# Patient Record
Sex: Male | Born: 1960 | Race: White | Hispanic: No | Marital: Married | Smoking: Former smoker
Health system: Southern US, Community
[De-identification: ages and names within clinical notes are randomized; demographics above are authoritative.]

## PROBLEM LIST (undated history)

## (undated) DIAGNOSIS — T4145XA Adverse effect of unspecified anesthetic, initial encounter: Secondary | ICD-10-CM

## (undated) DIAGNOSIS — G8929 Other chronic pain: Secondary | ICD-10-CM

## (undated) DIAGNOSIS — K559 Vascular disorder of intestine, unspecified: Secondary | ICD-10-CM

## (undated) DIAGNOSIS — R42 Dizziness and giddiness: Secondary | ICD-10-CM

## (undated) DIAGNOSIS — E1142 Type 2 diabetes mellitus with diabetic polyneuropathy: Secondary | ICD-10-CM

## (undated) DIAGNOSIS — M7502 Adhesive capsulitis of left shoulder: Secondary | ICD-10-CM

## (undated) DIAGNOSIS — E785 Hyperlipidemia, unspecified: Secondary | ICD-10-CM

## (undated) DIAGNOSIS — I639 Cerebral infarction, unspecified: Secondary | ICD-10-CM

## (undated) DIAGNOSIS — I10 Essential (primary) hypertension: Secondary | ICD-10-CM

## (undated) DIAGNOSIS — F329 Major depressive disorder, single episode, unspecified: Secondary | ICD-10-CM

## (undated) DIAGNOSIS — M1990 Unspecified osteoarthritis, unspecified site: Secondary | ICD-10-CM

## (undated) DIAGNOSIS — F32A Depression, unspecified: Secondary | ICD-10-CM

## (undated) DIAGNOSIS — G4730 Sleep apnea, unspecified: Secondary | ICD-10-CM

## (undated) DIAGNOSIS — Z5189 Encounter for other specified aftercare: Secondary | ICD-10-CM

## (undated) DIAGNOSIS — I6930 Unspecified sequelae of cerebral infarction: Secondary | ICD-10-CM

## (undated) DIAGNOSIS — R61 Generalized hyperhidrosis: Secondary | ICD-10-CM

## (undated) DIAGNOSIS — I4891 Unspecified atrial fibrillation: Secondary | ICD-10-CM

## (undated) DIAGNOSIS — R51 Headache: Secondary | ICD-10-CM

## (undated) DIAGNOSIS — R519 Headache, unspecified: Secondary | ICD-10-CM

## (undated) DIAGNOSIS — M7542 Impingement syndrome of left shoulder: Secondary | ICD-10-CM

## (undated) DIAGNOSIS — K4090 Unilateral inguinal hernia, without obstruction or gangrene, not specified as recurrent: Secondary | ICD-10-CM

## (undated) DIAGNOSIS — T8859XA Other complications of anesthesia, initial encounter: Secondary | ICD-10-CM

## (undated) DIAGNOSIS — G5790 Unspecified mononeuropathy of unspecified lower limb: Secondary | ICD-10-CM

## (undated) DIAGNOSIS — K5790 Diverticulosis of intestine, part unspecified, without perforation or abscess without bleeding: Secondary | ICD-10-CM

## (undated) DIAGNOSIS — R55 Syncope and collapse: Secondary | ICD-10-CM

## (undated) DIAGNOSIS — F419 Anxiety disorder, unspecified: Secondary | ICD-10-CM

## (undated) HISTORY — PX: WRIST SURGERY: SHX841

## (undated) HISTORY — DX: Unspecified osteoarthritis, unspecified site: M19.90

## (undated) HISTORY — DX: Type 2 diabetes mellitus with diabetic polyneuropathy: E11.42

## (undated) HISTORY — DX: Dizziness and giddiness: R42

## (undated) HISTORY — DX: Hyperlipidemia, unspecified: E78.5

## (undated) HISTORY — DX: Diverticulosis of intestine, part unspecified, without perforation or abscess without bleeding: K57.90

## (undated) HISTORY — DX: Vascular disorder of intestine, unspecified: K55.9

## (undated) HISTORY — DX: Unilateral inguinal hernia, without obstruction or gangrene, not specified as recurrent: K40.90

## (undated) HISTORY — PX: KNEE ARTHROSCOPY: SUR90

## (undated) HISTORY — DX: Major depressive disorder, single episode, unspecified: F32.9

## (undated) HISTORY — DX: Generalized hyperhidrosis: R61

## (undated) HISTORY — PX: UMBILICAL HERNIA REPAIR: SHX196

## (undated) HISTORY — PX: INGUINAL HERNIA REPAIR: SUR1180

## (undated) HISTORY — DX: Cerebral infarction, unspecified: I63.9

## (undated) HISTORY — DX: Anxiety disorder, unspecified: F41.9

## (undated) HISTORY — PX: COLONOSCOPY: SHX174

## (undated) HISTORY — DX: Unspecified mononeuropathy of unspecified lower limb: G57.90

## (undated) HISTORY — DX: Sleep apnea, unspecified: G47.30

## (undated) HISTORY — PX: CERVICAL DISC SURGERY: SHX588

## (undated) HISTORY — DX: Encounter for other specified aftercare: Z51.89

## (undated) HISTORY — PX: SHOULDER ARTHROSCOPY: SHX128

## (undated) HISTORY — DX: Depression, unspecified: F32.A

## (undated) HISTORY — DX: Headache: R51

## (undated) HISTORY — DX: Headache, unspecified: R51.9

## (undated) HISTORY — DX: Other chronic pain: G89.29

## (undated) HISTORY — DX: Syncope and collapse: R55

## (undated) MED FILL — Folic Acid Tab 1 MG: ORAL | Fill #1 | Status: CN

---

## 2001-05-03 HISTORY — PX: COLON SURGERY: SHX602

## 2002-10-01 ENCOUNTER — Encounter: Payer: Self-pay | Admitting: Emergency Medicine

## 2002-10-01 ENCOUNTER — Inpatient Hospital Stay (HOSPITAL_COMMUNITY): Admission: EM | Admit: 2002-10-01 | Discharge: 2002-10-05 | Payer: Self-pay | Admitting: Emergency Medicine

## 2002-11-06 ENCOUNTER — Encounter: Payer: Self-pay | Admitting: Family Medicine

## 2002-11-06 ENCOUNTER — Inpatient Hospital Stay (HOSPITAL_COMMUNITY): Admission: AD | Admit: 2002-11-06 | Discharge: 2002-11-11 | Payer: Self-pay | Admitting: Family Medicine

## 2002-11-27 ENCOUNTER — Ambulatory Visit (HOSPITAL_COMMUNITY): Admission: RE | Admit: 2002-11-27 | Discharge: 2002-11-27 | Payer: Self-pay | Admitting: Internal Medicine

## 2002-12-04 ENCOUNTER — Inpatient Hospital Stay (HOSPITAL_COMMUNITY): Admission: RE | Admit: 2002-12-04 | Discharge: 2002-12-12 | Payer: Self-pay | Admitting: General Surgery

## 2002-12-28 ENCOUNTER — Ambulatory Visit (HOSPITAL_COMMUNITY): Admission: RE | Admit: 2002-12-28 | Discharge: 2002-12-28 | Payer: Self-pay | Admitting: General Surgery

## 2003-12-05 ENCOUNTER — Ambulatory Visit (HOSPITAL_COMMUNITY): Admission: RE | Admit: 2003-12-05 | Discharge: 2003-12-05 | Payer: Self-pay | Admitting: Orthopedic Surgery

## 2005-11-15 ENCOUNTER — Ambulatory Visit (HOSPITAL_COMMUNITY): Admission: RE | Admit: 2005-11-15 | Discharge: 2005-11-15 | Payer: Self-pay | Admitting: Unknown Physician Specialty

## 2005-12-27 ENCOUNTER — Encounter: Admission: RE | Admit: 2005-12-27 | Discharge: 2005-12-27 | Payer: Self-pay | Admitting: Neurosurgery

## 2005-12-29 ENCOUNTER — Encounter: Admission: RE | Admit: 2005-12-29 | Discharge: 2005-12-29 | Payer: Self-pay | Admitting: Neurosurgery

## 2006-01-26 ENCOUNTER — Observation Stay (HOSPITAL_COMMUNITY): Admission: RE | Admit: 2006-01-26 | Discharge: 2006-01-28 | Payer: Self-pay | Admitting: Neurosurgery

## 2010-10-02 ENCOUNTER — Other Ambulatory Visit: Payer: Self-pay | Admitting: Family Medicine

## 2010-10-02 DIAGNOSIS — M25511 Pain in right shoulder: Secondary | ICD-10-CM

## 2010-10-05 ENCOUNTER — Ambulatory Visit (HOSPITAL_COMMUNITY)
Admission: RE | Admit: 2010-10-05 | Discharge: 2010-10-05 | Disposition: A | Discharge status: Home / Self Care | Payer: 59 | Source: Ambulatory Visit | Attending: Family Medicine | Admitting: Family Medicine

## 2010-10-05 DIAGNOSIS — M25519 Pain in unspecified shoulder: Secondary | ICD-10-CM | POA: Insufficient documentation

## 2010-10-05 DIAGNOSIS — M19019 Primary osteoarthritis, unspecified shoulder: Secondary | ICD-10-CM | POA: Insufficient documentation

## 2010-10-05 DIAGNOSIS — M719 Bursopathy, unspecified: Secondary | ICD-10-CM | POA: Insufficient documentation

## 2010-10-05 DIAGNOSIS — M67919 Unspecified disorder of synovium and tendon, unspecified shoulder: Secondary | ICD-10-CM | POA: Insufficient documentation

## 2010-10-05 DIAGNOSIS — M25511 Pain in right shoulder: Secondary | ICD-10-CM

## 2010-11-11 ENCOUNTER — Encounter (INDEPENDENT_AMBULATORY_CARE_PROVIDER_SITE_OTHER): Payer: Self-pay | Admitting: Surgery

## 2010-11-11 ENCOUNTER — Ambulatory Visit (INDEPENDENT_AMBULATORY_CARE_PROVIDER_SITE_OTHER): Payer: 59 | Admitting: Surgery

## 2010-11-11 VITALS — BP 140/90 | HR 72 | Temp 96.7°F | Ht 73.0 in | Wt 192.8 lb

## 2010-11-11 DIAGNOSIS — K430 Incisional hernia with obstruction, without gangrene: Secondary | ICD-10-CM

## 2010-11-11 NOTE — Progress Notes (Signed)
Subjective:     Patient ID: Travis [name], male   DOB: 02-18-1961, 50 y.o.   MRN: [id number]    BP 140/90  Pulse 72  Temp 96.7 F (35.9 C)  Ht 6\' 1"  (1.854 m)  Wt 192 lb 12.8 oz (87.454 kg)  BMI 25.44 kg/m2    HPI This is a pleasant 50 year old gentleman referred by Dr.-Luking for evaluation of a incisional hernia. The patient has had the hernia for several years. This was after partial colectomy for diverticulitis. He is having increasing discomfort and some possible nausea but no other obstructive symptoms. He reports that the hernia always easily reduces. The pain is described as a mild ache. He has no other complaints. Past Medical History  Diagnosis Date  . Night sweats     every once in a while  . Hyperlipidemia   . Diabetes mellitus   . Hernia, inguinal   . Dizziness   . Chronic headaches   . Arthritis     Past Surgical History  Procedure Date  . Hernia repair     2  . Colon surgery     2/3 taken out  . Neck surgery   . Wrist surgery     fusion  . Shoulder arthroscopy     1  . Knee arthroscopy    No Known Allergies  Current outpatient prescriptions:aspirin 81 MG tablet, Take 81 mg by mouth daily.  , Disp: , Rfl: ;  Cyanocobalamin (VITAMIN B12 PO), Take by mouth daily.  , Disp: , Rfl: ;  Gemfibrozil (LOPID PO), Take by mouth 2 (two) times daily.  , Disp: , Rfl: ;  GLYBURIDE PO, Take by mouth 2 (two) times daily.  , Disp: , Rfl: ;  METFORMIN HCL PO, Take by mouth 2 (two) times daily.  , Disp: , Rfl:  Naproxen (NAPROSYN PO), Take by mouth 2 (two) times daily.  , Disp: , Rfl: ;  NIACIN, ANTIHYPERLIPIDEMIC, PO, Take by mouth daily.  , Disp: , Rfl: ;  Omega-3 Fat Ac-Cholecalciferol (OMEGA ESSENTIALS/VIT D3 PO), Take by mouth daily.  , Disp: , Rfl:   History   Social History  . Marital Status: Married    Spouse Name: N/A    Number of Children: N/A  . Years of Education: N/A   Occupational History  . Not on file.   Social History Main Topics  . Smoking status:  Former Games developer  . Smokeless tobacco: Not on file  . Alcohol Use: 0.0 oz/week    5-7 Cans of beer per week  . Drug Use: No  . Sexually Active:    Other Topics Concern  . Not on file   Social History Narrative  . No narrative on file     Review of Systems  Constitutional: Negative.   HENT: Negative.   Eyes: Negative.   Respiratory: Negative.   Cardiovascular: Negative.   Gastrointestinal: Negative.   Genitourinary: Negative.   Musculoskeletal: Negative.   Neurological: Negative.   Psychiatric/Behavioral: Negative.        Objective:   Physical Exam  Constitutional: He is oriented to person, place, and time. He appears well-developed and well-nourished.  HENT:  Head: Normocephalic and atraumatic.  Right Ear: External ear normal.  Left Ear: External ear normal.  Nose: Nose normal.  Mouth/Throat: Oropharynx is clear and moist. No oropharyngeal exudate.  Eyes: Pupils are equal, round, and reactive to light. No scleral icterus.  Neck: Normal range of motion. No tracheal deviation present. No thyromegaly present.  Cardiovascular: Normal rate, regular rhythm and normal heart sounds.   No murmur heard. Pulmonary/Chest: Effort normal and breath sounds normal. No respiratory distress.  Abdominal: Soft. Normal appearance and bowel sounds are normal. He exhibits no distension. There is no rebound.  Musculoskeletal: Normal range of motion. He exhibits no edema and no tenderness.  Neurological: He is alert and oriented to person, place, and time. He has normal reflexes.  Skin: Skin is warm and dry. No rash noted.  Psychiatric: He has a normal mood and affect. His behavior is normal.       Assessment:       Incisional hernia Plan:     I discussed the diagnosis with the patient and his wife in detail. I discussed repair of the hernia with mesh. I discussed both laparoscopic and open repair. I discussed the risk of microscopic surgery including the risks of bleeding, infection,  injury to the intestines, need for converting to open procedure, recurrence, etc. He understands and wishes to proceed. Surgery will be scheduled.

## 2010-12-11 ENCOUNTER — Encounter (HOSPITAL_COMMUNITY): Payer: 59

## 2010-12-11 ENCOUNTER — Other Ambulatory Visit (INDEPENDENT_AMBULATORY_CARE_PROVIDER_SITE_OTHER): Payer: Self-pay | Admitting: Surgery

## 2010-12-11 LAB — BASIC METABOLIC PANEL
BUN: 16 mg/dL (ref 6–23)
CO2: 25 mEq/L (ref 19–32)
Calcium: 9.8 mg/dL (ref 8.4–10.5)
Creatinine, Ser: 0.92 mg/dL (ref 0.50–1.35)
GFR calc Af Amer: >60 mL/min (ref >60)
Glucose, Bld: 155 mg/dL — ABNORMAL HIGH (ref 70–99)
Potassium: 4.3 mEq/L (ref 3.5–5.1)

## 2010-12-11 LAB — CBC
HCT: 41.2 % (ref 39.0–52.0)
Hemoglobin: 14.1 g/dL (ref 13.0–17.0)
MCH: 30.7 pg (ref 26.0–34.0)
MCV: 89.8 fL (ref 78.0–100.0)
RBC: 4.59 MIL/uL (ref 4.22–5.81)

## 2010-12-11 LAB — SURGICAL PCR SCREEN: Staphylococcus aureus: NEGATIVE

## 2010-12-17 ENCOUNTER — Observation Stay (HOSPITAL_COMMUNITY)
Admission: RE | Admit: 2010-12-17 | Discharge: 2010-12-18 | Disposition: A | Discharge status: Home / Self Care | Payer: 59 | Source: Ambulatory Visit | Attending: Surgery | Admitting: Surgery

## 2010-12-17 DIAGNOSIS — K432 Incisional hernia without obstruction or gangrene: Secondary | ICD-10-CM

## 2010-12-17 DIAGNOSIS — Z01810 Encounter for preprocedural cardiovascular examination: Secondary | ICD-10-CM | POA: Insufficient documentation

## 2010-12-17 DIAGNOSIS — Z79899 Other long term (current) drug therapy: Secondary | ICD-10-CM | POA: Insufficient documentation

## 2010-12-17 DIAGNOSIS — E119 Type 2 diabetes mellitus without complications: Secondary | ICD-10-CM | POA: Insufficient documentation

## 2010-12-17 DIAGNOSIS — E785 Hyperlipidemia, unspecified: Secondary | ICD-10-CM | POA: Insufficient documentation

## 2010-12-17 DIAGNOSIS — Z01812 Encounter for preprocedural laboratory examination: Secondary | ICD-10-CM | POA: Insufficient documentation

## 2010-12-17 LAB — GLUCOSE, CAPILLARY
Glucose-Capillary: 171 mg/dL — ABNORMAL HIGH (ref 70–99)
Glucose-Capillary: 207 mg/dL — ABNORMAL HIGH (ref 70–99)

## 2010-12-18 LAB — GLUCOSE, CAPILLARY

## 2010-12-20 NOTE — Op Note (Signed)
NAMEOCTAVIA, MOTTOLA NO.:  [id number]  MEDICAL RECORD NO.:  [id number]  LOCATION:  1539                         FACILITY:  [facility]  PHYSICIAN:  Abigail Miyamoto, M.D. DATE OF BIRTH:  10-May-1960  DATE OF PROCEDURE:  12/17/2010 DATE OF DISCHARGE:                              OPERATIVE REPORT   PREOPERATIVE DIAGNOSIS:  Ventral incisional hernia.  POSTOPERATIVE DIAGNOSIS:  Ventral incisional hernia.  PROCEDURE:  Laparoscopic incisional hernia repair with mesh (15 cm x 10 cm Physiomesh).  SURGEON:  Abigail Miyamoto, MD  ANESTHESIA:  General and 0.5% Marcaine.  ESTIMATED BLOOD LOSS:  Minimal.  INDICATION:  Travis [name] is a 50 year old gentleman who has had a previous sigmoid colectomy.  He now presents with an incisional hernia, focused at the umbilicus.  Decision was made to proceed with laparoscopic repair.  FINDINGS:  The patient was found to have a moderate amount of adhesions of omentum to the abdominal wall.  There was no bowel involved with the hernia, it was repaired with a 15 cm x 10 cm piece of Physiomesh.  PROCEDURE IN DETAIL:  The patient brought to operating room, identified as Travis [name].  He was placed on operative table and general anesthesia was induced.  His abdomen was then prepped and draped in usual sterile fashion.  Using a #15 blade, a small incision was made in the patient's left upper quadrant.  Using the Optiveiw port and 5 mm camera, entry was then gained through muscle layers and fascia into the abdominal cavity under direct vision.  Insufflation was then begun. Another 5 mm port was then placed in the patient's left lower quadrant under direct vision.  I then performed lysis of adhesions both bluntly and with sharp dissection and electrocautery to remove the omentum that was stuck to the abdominal wall.  There was no bowel found to be stuck up to the abdominal wall.  Once all the omentum was removed, hemostasis appeared  to be achieved.  The fascial defect at the umbilicus was easily identified along with some previously placed Prolene sutures and a very small fascial defect just below this.  I measured the size of the defect and then decided to proceed with placement of a 15 cm x 10 cm piece of Physiomesh.  The mesh was brought to the field.  I made a 5 mm lower port site larger and placed an 11 mm trocar there.  I then placed 4 separate 0 Novafil sutures into the mesh, I rolled the mesh up and placed into the large port and opened it up in the abdominal cavity.  I then made 4 separate stab incisions with #11 blade under direct vision and we used a suture passer under direct vision to pull all the sutures up through the 4 separate skin incisions.  I then tied the sutures in place, securing the mesh to the abdominal wall.  Good coverage in all directions of the fascial defect appeared to be achieved.  I then used the SecureStrap absorbable tacker to tack the mesh in circumferentially. Again, wide coverage of the fascial defect appeared to be achieved.  I then again examined the rest of the abdomen and again hemostasis  appeared to be achieved as well.  The ports were then removed under direct vision and the abdomen was deflated.  All incisions were then anesthetized with Marcaine and closed with 4-0 Monocryl subcuticular sutures.  Steri-Strips, gauze, and tape were then applied.  The patient tolerated the procedure well.  All counts were correct at the end of procedure.  The patient was then extubated in operating room and taken in stable condition to recovery room.     Abigail Miyamoto, M.D.     DB/MEDQ  D:  12/17/2010  T:  12/18/2010  Job:  [id number]  Electronically Signed by Abigail Miyamoto M.D. on 12/20/2010 05:40:44 PM

## 2011-01-07 ENCOUNTER — Ambulatory Visit (INDEPENDENT_AMBULATORY_CARE_PROVIDER_SITE_OTHER): Payer: 59 | Admitting: Surgery

## 2011-01-07 ENCOUNTER — Encounter (INDEPENDENT_AMBULATORY_CARE_PROVIDER_SITE_OTHER): Payer: Self-pay | Admitting: Surgery

## 2011-01-07 VITALS — BP 132/80 | HR 72

## 2011-01-07 DIAGNOSIS — K625 Hemorrhage of anus and rectum: Secondary | ICD-10-CM

## 2011-01-07 NOTE — Progress Notes (Signed)
Subjective:     Patient ID: Travis [name], male   DOB: [birth year], 50 y.o.   MRN: [id number]  HPI He is here for his postoperative visit status post microscopic ventral incisional hernia repair with mesh performed on August 17. He is having normal mild postoperative discomfort. He has noticed some blood in his stools. His last colonoscopy was at least 5 years ago in [place] after he had perforated diverticulitis. He has had no problems moving his bowels. He has no family history of colon cancer.  Review of Systems     Objective:   Physical Exam On exam, his abdominal incisions are well healed. There is no evidence of recurrent hernia. He refused rectal examination.    Assessment:     Patient status post ventral hernia repair doing well. Patient complaining of blood in stool.    Plan:     From a hernia standpoint, he may now start returning to normal activity. I believe the blood in his stool is unrelated to the surgery. He is due for another colonoscopy. He would like to be referred to gastroenterologists in [place]. I will make this referral. I will see him back here as needed.

## 2011-01-08 NOTE — Discharge Summary (Signed)
  NAMEDEHAVEN, SINE NO.:  [id number]  MEDICAL RECORD NO.:  [id number]  LOCATION:  1539                         FACILITY:  [facility]  PHYSICIAN:  Abigail Miyamoto, M.D. DATE OF BIRTH:  [birth year]  DATE OF ADMISSION:  12/17/2010 DATE OF DISCHARGE:  12/18/2010                              DISCHARGE SUMMARY   DISCHARGE DIAGNOSIS:  Ventral incisional hernia, status post laparoscopic ventral incisional hernia repair.  HISTORY:  This is a 50 year old gentleman who has had previous abdominal surgery, now presents with a symptomatic incisional hernia at the umbilicus.  He is being admitted for laparoscopic repair.  HOSPITAL COURSE:  Patient was admitted and taken to the operating room where he underwent a laparoscopic ventral incisional hernia repair with mesh.  He tolerated procedure well and was taken to regular surgical floor.  On postop day #1, he was doing well.  His abdomen was soft.  His incisions were clean.  His pain was well controlled with oral pain medications, he was voiding well, and decision was made to discharge the patient to home.  DISCHARGE DIET:  Regular.  DISCHARGE ACTIVITY:  He will do no heavy lifting until he sees me back in the office.  He will resume his home medications.  He may shower.  He will follow up in my office in 1 to 2 weeks post discharge.     Abigail Miyamoto, M.D.     DB/MEDQ  D:  12/30/2010  T:  12/31/2010  Job:  [id number]  Electronically Signed by Abigail Miyamoto M.D. on 01/08/2011 12:36:38 PM

## 2011-01-18 ENCOUNTER — Telehealth (INDEPENDENT_AMBULATORY_CARE_PROVIDER_SITE_OTHER): Payer: Self-pay | Admitting: Surgery

## 2011-01-18 NOTE — Telephone Encounter (Signed)
He doesn't need surgery. He needs a screening colonscopy by GI

## 2011-02-10 ENCOUNTER — Encounter: Payer: Self-pay | Admitting: Internal Medicine

## 2011-02-18 ENCOUNTER — Ambulatory Visit (AMBULATORY_SURGERY_CENTER): Payer: 59 | Admitting: *Deleted

## 2011-02-18 VITALS — Ht 73.0 in | Wt 199.0 lb

## 2011-02-18 DIAGNOSIS — Z1211 Encounter for screening for malignant neoplasm of colon: Secondary | ICD-10-CM

## 2011-02-18 MED ORDER — PEG-KCL-NACL-NASULF-NA ASC-C 100 G PO SOLR: ORAL | Status: DC

## 2011-02-23 ENCOUNTER — Other Ambulatory Visit: Payer: 59 | Admitting: Internal Medicine

## 2011-02-25 ENCOUNTER — Ambulatory Visit (AMBULATORY_SURGERY_CENTER): Payer: 59 | Admitting: Internal Medicine

## 2011-02-25 ENCOUNTER — Encounter: Payer: Self-pay | Admitting: Internal Medicine

## 2011-02-25 VITALS — BP 133/87 | HR 82 | Temp 97.3°F | Resp 20 | Ht 73.0 in | Wt 199.0 lb

## 2011-02-25 DIAGNOSIS — Z139 Encounter for screening, unspecified: Secondary | ICD-10-CM

## 2011-02-25 DIAGNOSIS — Z1211 Encounter for screening for malignant neoplasm of colon: Secondary | ICD-10-CM

## 2011-02-25 MED ORDER — SODIUM CHLORIDE 0.9 % IV SOLN: 500.0000 mL | INTRAVENOUS | Status: DC

## 2011-02-25 NOTE — Patient Instructions (Signed)
NORMAL COLONOSCOPY-REPEAT COLONOSCOPY IN 10 YEARS-2022- WE WILL SEND YOU A LETTER REMINDING YOU OF THIS AND YOU CAN CALL AND SCHEDULE THIS PROCEDURE.  DISCHARGE INSTRUCTIONS PER BLUE AND GREEN SHEETS

## 2011-02-26 ENCOUNTER — Telehealth: Payer: Self-pay

## 2011-02-26 NOTE — Telephone Encounter (Signed)
No ID on answering machine. 

## 2012-08-15 ENCOUNTER — Other Ambulatory Visit: Payer: Self-pay | Admitting: *Deleted

## 2012-08-15 MED ORDER — NAPROXEN SODIUM 550 MG PO TABS: 550.0000 mg | ORAL_TABLET | Freq: Three times a day (TID) | ORAL | Status: DC

## 2012-09-11 ENCOUNTER — Encounter: Payer: Self-pay | Admitting: Family Medicine

## 2012-09-11 ENCOUNTER — Ambulatory Visit (INDEPENDENT_AMBULATORY_CARE_PROVIDER_SITE_OTHER): Payer: 59 | Admitting: Family Medicine

## 2012-09-11 VITALS — BP 130/82 | Wt 193.4 lb

## 2012-09-11 DIAGNOSIS — E1165 Type 2 diabetes mellitus with hyperglycemia: Secondary | ICD-10-CM | POA: Insufficient documentation

## 2012-09-11 DIAGNOSIS — E118 Type 2 diabetes mellitus with unspecified complications: Secondary | ICD-10-CM

## 2012-09-11 DIAGNOSIS — Z0000 Encounter for general adult medical examination without abnormal findings: Secondary | ICD-10-CM

## 2012-09-11 DIAGNOSIS — E785 Hyperlipidemia, unspecified: Secondary | ICD-10-CM

## 2012-09-11 MED ORDER — NAPROXEN 500 MG PO TABS: 500.0000 mg | ORAL_TABLET | Freq: Two times a day (BID) | ORAL | Status: DC

## 2012-09-11 MED ORDER — PREGABALIN 50 MG PO CAPS: 50.0000 mg | ORAL_CAPSULE | Freq: Three times a day (TID) | ORAL | Status: DC

## 2012-09-11 NOTE — Progress Notes (Signed)
  Subjective:    Patient ID: Travis [name], male    DOB: 1961/01/27, 52 y.o.   MRN: [id number]  HPI This gentleman comes in stating that he has intermittent numbness in his feet he relates it feels like his toes are falling asleep tingling these occur on a regular basis. He denies any other particular troubles. He also states his diabetes he thinks is under fair control he is not certain he has not followed up the way he should and he admits to this. He denies any other particular troubles. Past medical history diabetes hyperlipidemia He does not smoke Patient does have a history of neuropathy takes [place] Review of Systems He denies swelling in the legs denies pain in the ankles some discomfort on the bottom of his feet    Objective:   Physical Exam Pulses normal blood pressure good calves nontender ankles normal no swelling in the feet monofilament testing at multiple points on both feet are normal.       Assessment & Plan:  Neuropathy of the feet-these are to have nerve conduction studies back in 2011. I would recommend Lyrica 50 mg 3 times a day followup in several weeks' time for comprehensive checkup lab work from papers ordered. He will also need prostate exam on followup.

## 2012-09-11 NOTE — Patient Instructions (Signed)
When you get Lyrica filled it is okay for you to start off taking 1 per day for the first 4 days then one twice a day for the next 4 days. After that you may start taking one 3 times a day hopefully this will help the neuropathy in your feet. When you start Lyrica it is important to go ahead and stop nortriptyline.  Please do your labwork somewhere within the next couple weeks and please followup for a comprehensive checkup including a prostate exam.

## 2012-09-26 ENCOUNTER — Encounter: Payer: Self-pay | Admitting: *Deleted

## 2012-09-29 ENCOUNTER — Encounter: Payer: 59 | Admitting: Family Medicine

## 2012-10-05 ENCOUNTER — Other Ambulatory Visit: Payer: Self-pay | Admitting: Family Medicine

## 2013-03-15 ENCOUNTER — Other Ambulatory Visit: Payer: Self-pay | Admitting: Family Medicine

## 2013-03-16 ENCOUNTER — Other Ambulatory Visit: Payer: Self-pay | Admitting: *Deleted

## 2013-03-16 NOTE — Telephone Encounter (Signed)
Okay x3 patient will need office visit by December

## 2013-03-28 ENCOUNTER — Ambulatory Visit (INDEPENDENT_AMBULATORY_CARE_PROVIDER_SITE_OTHER): Payer: 59 | Admitting: Family Medicine

## 2013-03-28 ENCOUNTER — Encounter: Payer: Self-pay | Admitting: Family Medicine

## 2013-03-28 VITALS — BP 130/88 | Ht 73.0 in | Wt 194.2 lb

## 2013-03-28 DIAGNOSIS — E785 Hyperlipidemia, unspecified: Secondary | ICD-10-CM

## 2013-03-28 DIAGNOSIS — Z23 Encounter for immunization: Secondary | ICD-10-CM

## 2013-03-28 DIAGNOSIS — Z125 Encounter for screening for malignant neoplasm of prostate: Secondary | ICD-10-CM

## 2013-03-28 DIAGNOSIS — E119 Type 2 diabetes mellitus without complications: Secondary | ICD-10-CM

## 2013-03-28 DIAGNOSIS — E1142 Type 2 diabetes mellitus with diabetic polyneuropathy: Secondary | ICD-10-CM

## 2013-03-28 DIAGNOSIS — E1165 Type 2 diabetes mellitus with hyperglycemia: Secondary | ICD-10-CM

## 2013-03-28 DIAGNOSIS — Z79899 Other long term (current) drug therapy: Secondary | ICD-10-CM

## 2013-03-28 DIAGNOSIS — E1149 Type 2 diabetes mellitus with other diabetic neurological complication: Secondary | ICD-10-CM

## 2013-03-28 HISTORY — DX: Type 2 diabetes mellitus with diabetic polyneuropathy: E11.42

## 2013-03-28 MED ORDER — PREGABALIN 75 MG PO CAPS: 75.0000 mg | ORAL_CAPSULE | Freq: Three times a day (TID) | ORAL | Status: DC

## 2013-03-28 MED ORDER — LEVOFLOXACIN 500 MG PO TABS: 500.0000 mg | ORAL_TABLET | Freq: Every day | ORAL | Status: AC

## 2013-03-28 MED ORDER — MELOXICAM 15 MG PO TABS: 15.0000 mg | ORAL_TABLET | Freq: Every day | ORAL | Status: DC

## 2013-03-28 NOTE — Progress Notes (Signed)
   Subjective:    Patient ID: Travis [name], male    DOB: 03/06/1961, 52 y.o.   MRN: [id number]  Diabetes He presents for his follow-up diabetic visit. He has type 2 diabetes mellitus. His disease course has been stable. There are no hypoglycemic associated symptoms. There are no diabetic associated symptoms. There are no hypoglycemic complications. Symptoms are stable. There are no diabetic complications. There are no known risk factors for coronary artery disease. Current diabetic treatment includes oral agent (dual therapy). He is compliant with treatment all of the time.  A1C today is 5.9. The patient was seen today as part of a comprehensive diabetic check up. The patient had the following elements completed: -Review of medication compliance -Review of glucose monitoring results -Review of any complications do to high or low sugars -Diabetic foot exam was completed as part of today's visit. The following was also discussed: -Importance of yearly eye exams -Importance of following diabetic/low sugar-starch diet -Importance of exercise and regular activity -Importance of regular followup visits. -Most recent hemoglobin A1c were reviewed with the patient along with goals regarding diabetes.  Patient states he thinks he has a sinus infection. It has been present for about 3-4 days. Moderate sinus pressure congestion drainage coughing denies sweats chills or fever has low energy doesn't feel good  Patient states he has had left shoulder pain for about 6 months now. Hurts to move it very displayed a lot of baseball when he was younger. Denies any particular injuries. He states he does a good job of trying to take good care of himself. Takes Naprosyn when necessary  The patient with neuropathy in the feet burning and pins and needles. Lyrica helps him but he would like to try a higher dose. He denies any side effects with it.  Family history past medical history medicine list all  reviewed.     Review of Systems Denies chest tightness pressure pain vomiting diarrhea.    Objective:   Physical Exam  Constitutional: He appears well-developed and well-nourished.  HENT:  Head: Normocephalic and atraumatic.  Right Ear: External ear normal.  Left Ear: External ear normal.  Nose: Nose normal.  Mouth/Throat: Oropharynx is clear and moist.  Neck: Neck supple. No thyromegaly present.  Cardiovascular: Normal rate, regular rhythm and normal heart sounds.   No murmur heard. Pulmonary/Chest: Effort normal and breath sounds normal. No respiratory distress. He has no wheezes.  Abdominal: Soft. Bowel sounds are normal. He exhibits no distension and no mass. There is no tenderness.  Genitourinary: Prostate normal.  Musculoskeletal: Normal range of motion. He exhibits no edema.  Lymphadenopathy:    He has no cervical adenopathy.  Neurological: He is alert. He exhibits normal muscle tone.  Skin: Skin is warm and dry. No erythema.  Psychiatric: He has a normal mood and affect. His behavior is normal. Judgment normal.          Assessment & Plan:  #1 sinusitis-antibiotic send in #2 diabetes good control continue current measures watch diet physical activity annual eye exam recommended. #3 labs ordered for hyperlipidemia urine micro-protein kidney function #4 blood pressure good control continue low salt diet with regular physical activity #5 peripheral neuropathy-continue Lyrica should help. Increase the dose to 75 mg 3 times a day Followup 6 months

## 2013-04-06 ENCOUNTER — Ambulatory Visit: Payer: 59 | Admitting: Family Medicine

## 2013-05-25 ENCOUNTER — Other Ambulatory Visit: Payer: Self-pay | Admitting: Family Medicine

## 2013-09-21 ENCOUNTER — Other Ambulatory Visit: Payer: Self-pay | Admitting: Family Medicine

## 2013-11-01 ENCOUNTER — Other Ambulatory Visit: Payer: Self-pay | Admitting: Family Medicine

## 2013-11-05 NOTE — Telephone Encounter (Signed)
Ok plus five monthly ref 

## 2013-12-31 ENCOUNTER — Ambulatory Visit (INDEPENDENT_AMBULATORY_CARE_PROVIDER_SITE_OTHER): Payer: 59 | Admitting: Family Medicine

## 2013-12-31 ENCOUNTER — Encounter: Payer: Self-pay | Admitting: Family Medicine

## 2013-12-31 VITALS — BP 138/76 | Temp 98.5°F | Ht 73.0 in | Wt 188.4 lb

## 2013-12-31 DIAGNOSIS — K921 Melena: Secondary | ICD-10-CM

## 2013-12-31 DIAGNOSIS — R5383 Other fatigue: Secondary | ICD-10-CM

## 2013-12-31 DIAGNOSIS — E785 Hyperlipidemia, unspecified: Secondary | ICD-10-CM

## 2013-12-31 DIAGNOSIS — E119 Type 2 diabetes mellitus without complications: Secondary | ICD-10-CM

## 2013-12-31 DIAGNOSIS — R5381 Other malaise: Secondary | ICD-10-CM

## 2013-12-31 DIAGNOSIS — R42 Dizziness and giddiness: Secondary | ICD-10-CM

## 2013-12-31 DIAGNOSIS — Z79899 Other long term (current) drug therapy: Secondary | ICD-10-CM

## 2013-12-31 MED ORDER — LORAZEPAM 1 MG PO TABS: ORAL_TABLET | ORAL | Status: DC

## 2013-12-31 NOTE — Progress Notes (Signed)
   Subjective:    Patient ID: Travis [name], male    DOB: 11-25-1960, 53 y.o.   MRN: [id number]  Dizziness This is a new problem. The current episode started more than 1 month ago. The problem occurs intermittently. The problem has been unchanged. Associated symptoms include abdominal pain and vomiting. Nothing aggravates the symptoms. He has tried nothing for the symptoms. The treatment provided no relief.  Patient states when he vomited this morning, he saw a small spot of blood in it.  He states he's even seeing blood in his stools. He is up-to-date on colonoscopy. He states his minimal male with constipation.   Review of Systems  Gastrointestinal: Positive for vomiting and abdominal pain.  Neurological: Positive for dizziness.   Patient relates dizziness spells he feels like he can't walk straight he has had several times worries walked into a wall he has had some imbalance because that would last 30-60 minutes at a time.    Objective:   Physical Exam  Vitals reviewed. Constitutional: He is oriented to person, place, and time. He appears well-nourished. No distress.  HENT:  Head: Normocephalic and atraumatic.  Right Ear: External ear normal.  Left Ear: External ear normal.  Eyes: Pupils are equal, round, and reactive to light. Right eye exhibits no discharge. Left eye exhibits no discharge.  Neck: No tracheal deviation present.  Cardiovascular: Normal rate, regular rhythm and normal heart sounds.   No murmur heard. Pulmonary/Chest: Effort normal and breath sounds normal. No respiratory distress.  Abdominal: Soft. There is no tenderness.  Musculoskeletal: He exhibits no edema.  Lymphadenopathy:    He has no cervical adenopathy.  Neurological: He is alert and oriented to person, place, and time. No cranial nerve deficit. He exhibits normal muscle tone. Coordination normal.  Skin: Skin is warm and dry.  Psychiatric: His behavior is normal.    Finger to nose normal Romberg he  does waver strength good      Assessment & Plan:  Significant dizziness with ataxia spells these occur frequently. They've been going off and on for the past 4 months. When they hit their last anywhere from 30-60 minutes. There is no evidence of benign positional vertigo with this. He does have some numbness and tingling in the left foot but this could be related to his diabetes. Patient does relate some intermittent neck pain.  I do recommend MRI of the brain because of the above symptoms. I am concerned about the possibility of mini strokes. This patient has high risk for it. Does not do a good job taking care of his risk factors. We will use Ativan before the procedure because of fear of doing the MRI.  Noncompliant diabetes patient does not follow up as advised I recommend lab work followup again in 2 weeks' time  The issue of a small amount of blood in the stool I think is a nonspecific issue we will do Hemoccult cards and CBC

## 2014-01-01 ENCOUNTER — Ambulatory Visit (HOSPITAL_COMMUNITY)
Admission: RE | Admit: 2014-01-01 | Discharge: 2014-01-01 | Disposition: A | Discharge status: Home / Self Care | Payer: 59 | Source: Ambulatory Visit | Attending: Family Medicine | Admitting: Family Medicine

## 2014-01-01 DIAGNOSIS — R42 Dizziness and giddiness: Secondary | ICD-10-CM | POA: Insufficient documentation

## 2014-01-01 DIAGNOSIS — R413 Other amnesia: Secondary | ICD-10-CM | POA: Diagnosis not present

## 2014-01-01 DIAGNOSIS — G8190 Hemiplegia, unspecified affecting unspecified side: Secondary | ICD-10-CM | POA: Diagnosis not present

## 2014-01-02 ENCOUNTER — Telehealth: Payer: Self-pay | Admitting: Family Medicine

## 2014-01-02 ENCOUNTER — Other Ambulatory Visit (HOSPITAL_COMMUNITY): Payer: 59

## 2014-01-02 NOTE — Telephone Encounter (Signed)
Checking on results on MRI

## 2014-01-02 NOTE — Telephone Encounter (Signed)
Left message to return call 

## 2014-01-03 ENCOUNTER — Other Ambulatory Visit (HOSPITAL_COMMUNITY): Payer: 59

## 2014-01-03 NOTE — Telephone Encounter (Signed)
Results discussed with patient. Patient advised There is no sign of any stroke. He does have some small vessel vascular changes that is consistent with combination of getting older plus diabetes. The best approach is keeping cholesterol under control keeping her sugars under control keeping blood pressure under control. [name] also recommends 81 mg aspirin. [name] does recommend for the patient to do his lab work and followup within a few weeks.

## 2014-01-22 ENCOUNTER — Ambulatory Visit: Payer: 59 | Admitting: Family Medicine

## 2014-02-04 ENCOUNTER — Telehealth: Payer: Self-pay | Admitting: *Deleted

## 2014-02-04 NOTE — Telephone Encounter (Signed)
rx request from [facility]. meloxicam 15mg  #90 one qd. Last seen 12/31/13

## 2014-02-04 NOTE — Telephone Encounter (Signed)
May have Rx for 30 sent in, also send pt reminder to do labs and ov for diabetes

## 2014-02-05 ENCOUNTER — Ambulatory Visit: Payer: 59 | Admitting: Family Medicine

## 2014-02-05 MED ORDER — MELOXICAM 15 MG PO TABS: ORAL_TABLET | ORAL | Status: DC

## 2014-02-05 NOTE — Telephone Encounter (Signed)
Rx sent electronically to pharmacy. 

## 2014-02-12 ENCOUNTER — Other Ambulatory Visit: Payer: Self-pay | Admitting: Family Medicine

## 2014-02-16 ENCOUNTER — Other Ambulatory Visit: Payer: Self-pay | Admitting: Family Medicine

## 2014-02-16 LAB — CBC WITH DIFFERENTIAL/PLATELET
BASOS PCT: 0 % (ref 0–1)
Basophils Absolute: 0 10*3/uL (ref 0.0–0.1)
EOS ABS: 0.2 10*3/uL (ref 0.0–0.7)
Eosinophils Relative: 3 % (ref 0–5)
HEMATOCRIT: 43.9 % (ref 39.0–52.0)
HEMOGLOBIN: 15.7 g/dL (ref 13.0–17.0)
Lymphocytes Relative: 11 % — ABNORMAL LOW (ref 12–46)
Lymphs Abs: 0.7 10*3/uL (ref 0.7–4.0)
MCH: 32.7 pg (ref 26.0–34.0)
MCHC: 35.8 g/dL (ref 30.0–36.0)
MCV: 91.5 fL (ref 78.0–100.0)
MONOS PCT: 11 % (ref 3–12)
Monocytes Absolute: 0.7 10*3/uL (ref 0.1–1.0)
Neutro Abs: 4.9 10*3/uL (ref 1.7–7.7)
Neutrophils Relative %: 75 % (ref 43–77)
Platelets: 192 10*3/uL (ref 150–400)
RBC: 4.8 MIL/uL (ref 4.22–5.81)
RDW: 13.1 % (ref 11.5–15.5)
WBC: 6.5 10*3/uL (ref 4.0–10.5)

## 2014-02-17 LAB — MICROALBUMIN, URINE: Microalb, Ur: 0.8 mg/dL (ref <2.0)

## 2014-02-17 LAB — LIPID PANEL
CHOLESTEROL: 116 mg/dL (ref 0–200)
HDL: 33 mg/dL — ABNORMAL LOW (ref >39)
LDL CALC: 68 mg/dL (ref 0–99)
Total CHOL/HDL Ratio: 3.5 Ratio
Triglycerides: 76 mg/dL (ref <150)
VLDL: 15 mg/dL (ref 0–40)

## 2014-02-17 LAB — HEPATIC FUNCTION PANEL
ALT: 8 U/L (ref 0–53)
AST: 15 U/L (ref 0–37)
Albumin: 4.4 g/dL (ref 3.5–5.2)
Alkaline Phosphatase: 65 U/L (ref 39–117)
BILIRUBIN INDIRECT: 0.8 mg/dL (ref 0.2–1.2)
Bilirubin, Direct: 0.2 mg/dL (ref 0.0–0.3)
Total Bilirubin: 1 mg/dL (ref 0.2–1.2)
Total Protein: 6.9 g/dL (ref 6.0–8.3)

## 2014-02-17 LAB — BASIC METABOLIC PANEL
BUN: 14 mg/dL (ref 6–23)
CHLORIDE: 104 meq/L (ref 96–112)
CO2: 26 mEq/L (ref 19–32)
Calcium: 9.2 mg/dL (ref 8.4–10.5)
Creat: 0.82 mg/dL (ref 0.50–1.35)
GLUCOSE: 107 mg/dL — AB (ref 70–99)
POTASSIUM: 4.1 meq/L (ref 3.5–5.3)
Sodium: 140 mEq/L (ref 135–145)

## 2014-02-17 LAB — HEMOGLOBIN A1C
Hgb A1c MFr Bld: 6.1 % — ABNORMAL HIGH (ref <5.7)
Mean Plasma Glucose: 128 mg/dL — ABNORMAL HIGH (ref <117)

## 2014-02-18 ENCOUNTER — Ambulatory Visit (INDEPENDENT_AMBULATORY_CARE_PROVIDER_SITE_OTHER): Payer: 59 | Admitting: Family Medicine

## 2014-02-18 ENCOUNTER — Encounter: Payer: Self-pay | Admitting: Family Medicine

## 2014-02-18 VITALS — BP 122/76 | Ht 73.0 in | Wt 185.0 lb

## 2014-02-18 DIAGNOSIS — E785 Hyperlipidemia, unspecified: Secondary | ICD-10-CM | POA: Insufficient documentation

## 2014-02-18 DIAGNOSIS — Z23 Encounter for immunization: Secondary | ICD-10-CM

## 2014-02-18 DIAGNOSIS — E1142 Type 2 diabetes mellitus with diabetic polyneuropathy: Secondary | ICD-10-CM

## 2014-02-18 DIAGNOSIS — M25512 Pain in left shoulder: Secondary | ICD-10-CM

## 2014-02-18 DIAGNOSIS — E119 Type 2 diabetes mellitus without complications: Secondary | ICD-10-CM

## 2014-02-18 DIAGNOSIS — E1342 Other specified diabetes mellitus with diabetic polyneuropathy: Secondary | ICD-10-CM

## 2014-02-18 DIAGNOSIS — G629 Polyneuropathy, unspecified: Secondary | ICD-10-CM

## 2014-02-18 NOTE — Progress Notes (Signed)
   Subjective:    Patient ID: Travis [name], male    DOB: 1961-05-02, 53 y.o.   MRN: [id number]  HPI Patient is here today to go over the blood work results.   He would also like to get the flu and Tdap vaccines.  The patient was seen today as part of a comprehensive diabetic check up. The patient had the following elements completed: -Review of medication compliance -Review of glucose monitoring results -Review of any complications do to high or low sugars -Diabetic foot exam was completed as part of today's visit. The following was also discussed: -Importance of yearly eye exams -Importance of following diabetic/low sugar-starch diet -Importance of exercise and regular activity -Importance of regular followup visits. -Most recent hemoglobin A1c were reviewed with the patient along with goals regarding diabetes.    Review of Systems  Constitutional: Negative for activity change, appetite change and fatigue.  HENT: Negative for congestion.   Respiratory: Negative for cough.   Cardiovascular: Negative for chest pain.  Gastrointestinal: Negative for abdominal pain.  Endocrine: Negative for polydipsia and polyphagia.  Neurological: Negative for weakness.  Psychiatric/Behavioral: Negative for confusion.       Objective:   Physical Exam  Vitals reviewed. Constitutional: He appears well-nourished. No distress.  Cardiovascular: Normal rate, regular rhythm and normal heart sounds.   No murmur heard. Pulmonary/Chest: Effort normal and breath sounds normal. No respiratory distress.  Musculoskeletal: He exhibits no edema.  Lymphadenopathy:    He has no cervical adenopathy.  Neurological: He is alert.  Psychiatric: His behavior is normal.          Assessment & Plan:  1. Encounter for immunization Today  2. Left shoulder pain He is losing range of motion in that shoulder I told him it is imperative to see orthopedics set him up with Dr.Sypher - Ambulatory referral to  Orthopedic Surgery  3. Diabetic polyneuropathy associated with type 2 diabetes mellitus A1c looks good. Causing some neuropathy. Continue medication use medicine as directed  4. Diabetes type 2, controlled Control is good.  5. Diabetic peripheral neuropathy See above  6. Hyperlipidemia Lab work ordered continue current medications  Patient up-to-date on colonoscopy Immunizations updated Followup 3-4 months

## 2014-02-18 NOTE — Patient Instructions (Addendum)
Eye exam yearly  Reduce Metformin 500 to one 2 times a day  Flu vaccine and Tdap today  Stop Niacin  Heme occult cards- please do and turn in  We will set up appt with ortho, Dr Daylene Katayama  A1C 6.1 very good, recheck this in 3 months  Weekly weights and send to [place] every month

## 2014-02-19 ENCOUNTER — Encounter: Payer: Self-pay | Admitting: Family Medicine

## 2014-02-19 LAB — PSA: PSA: 0.8 ng/mL (ref <=4.00)

## 2014-03-04 ENCOUNTER — Telehealth: Payer: Self-pay | Admitting: Family Medicine

## 2014-03-04 NOTE — Telephone Encounter (Signed)
Tried to refer pt to Dr. Daylene Katayama as you requested, found out when I called to refer that Dr. Daylene Katayama retired about 2 months ago, please advise

## 2014-03-04 NOTE — Telephone Encounter (Signed)
[place] doctor! One of his partners would be fine that can handle [place] problem

## 2014-03-08 ENCOUNTER — Telehealth: Payer: Self-pay | Admitting: Internal Medicine

## 2014-03-08 NOTE — Telephone Encounter (Signed)
Left message for pt to call back.  Wife states pt had quite a bit of BRB this morning from his rectum. Requests pt be seen sooner than 1st available. Pt scheduled to see Alonza Bogus PA 03/13/14@3pm . Wife aware of appt.

## 2014-03-13 ENCOUNTER — Encounter: Payer: Self-pay | Admitting: Gastroenterology

## 2014-03-13 ENCOUNTER — Ambulatory Visit (INDEPENDENT_AMBULATORY_CARE_PROVIDER_SITE_OTHER): Payer: 59 | Admitting: Gastroenterology

## 2014-03-13 VITALS — BP 126/90 | HR 76 | Ht 71.5 in | Wt 187.2 lb

## 2014-03-13 DIAGNOSIS — K625 Hemorrhage of anus and rectum: Secondary | ICD-10-CM

## 2014-03-13 MED ORDER — HYDROCORTISONE ACETATE 25 MG RE SUPP: 25.0000 mg | Freq: Two times a day (BID) | RECTAL | Status: DC

## 2014-03-13 NOTE — Progress Notes (Signed)
Agree with initial assessment and plans. Any further bleeding will likely need further investigation.

## 2014-03-13 NOTE — Patient Instructions (Signed)
We have sent the following medications to your pharmacy for you to pick up at your convenience: Anusol Suppository

## 2014-03-13 NOTE — Progress Notes (Signed)
03/13/2014 Travis [name] [id number] 1961/03/30   HISTORY OF PRESENT ILLNESS:  This is a pleasant 53 year old male who is known to Dr. Henrene Pastor for colonoscopy in 02/2011 at which time the study was normal except findings c/w prior segmental colectomy in the sigmoid colon; repeat recommended in 10 years from that time.  Had segmental colectomy for diverticulitis about 7 years ago at [facility].  He presents to our office today with complaints of rectal bleeding.  It has been present intermittently, occurring once every 2-6 weeks with small amounts of bright red blood on the toilet paper and in the toilet.  Usually occurs with one or two BM's then goes away.  The last time that he saw blood was 5 days ago.  His last Hgb 3 weeks ago was 15.7 grams.     Past Medical History  Diagnosis Date  . Night sweats     every once in a while  . Hyperlipidemia   . Diabetes mellitus   . Hernia, inguinal   . Dizziness   . Chronic headaches   . Arthritis   . Neuropathy of lower extremity   . Diabetic peripheral neuropathy 03/28/2013  . Diverticulosis    Past Surgical History  Procedure Laterality Date  . Inguinal hernia repair Bilateral   . Cervical disc surgery    . Wrist surgery Right     fusion  . Shoulder arthroscopy Right     1  . Knee arthroscopy Right   . Colon surgery  2003    1/3 removed for diverticulitis  . Colonoscopy    . Umbilical hernia repair      with other hernia repair with mesh    reports that he has quit smoking. His smoking use included Cigarettes. He smoked 0.00 packs per day. He quit smokeless tobacco use about 35 years ago. His smokeless tobacco use included Chew. He reports that he drinks about 0.6 oz of alcohol per week. He reports that he does not use illicit drugs. family history includes ALS (age of onset: 51) in his brother; Diabetes in his maternal grandfather; Heart attack (age of onset: 70) in his sister; Hyperlipidemia in his father; Stroke in his  father. No Known Allergies    Outpatient Encounter Prescriptions as of 03/13/2014  Medication Sig  . aspirin 81 MG tablet Take 81 mg by mouth daily.    . Cholecalciferol (VITAMIN D) 400 UNITS capsule Take 400 Units by mouth daily.    . Cyanocobalamin (VITAMIN B12 PO) Take by mouth daily.    . fenofibrate 160 MG tablet Take 160 mg by mouth daily.    Marland Kitchen glyBURIDE micronized (GLYNASE) 6 MG tablet TAKE 1 TABLET BY MOUTH TWICE DAILY  . lisinopril (PRINIVIL,ZESTRIL) 2.5 MG tablet TAKE 1 TABLET BY MOUTH EVERY MORNING  . LORazepam (ATIVAN) 1 MG tablet Take one tablet one hour before procedure  . LYRICA 75 MG capsule TAKE 1 CAPSULE BY MOUTH 3 TIMES DAILY  . meloxicam (MOBIC) 15 MG tablet TAKE 1 TABLET BY MOUTH ONCE DAILY  . metFORMIN (GLUCOPHAGE) 500 MG tablet TAKE 1 TABLET BY MOUTH TWICE DAILY  . pravastatin (PRAVACHOL) 20 MG tablet TAKE 1 TABLET BY MOUTH EVERY MORNING  . valACYclovir (VALTREX) 1000 MG tablet TAKE 1 TABLET BY MOUTH ONCE DAILY  . hydrocortisone (ANUSOL-HC) 25 MG suppository Place 1 suppository (25 mg total) rectally 2 (two) times daily.  . [DISCONTINUED] GLYBURIDE PO Take 3 mg by mouth 2 (two) times daily.  REVIEW OF SYSTEMS  : All other systems reviewed and negative except where noted in the History of Present Illness.   PHYSICAL EXAM: BP 126/90 mmHg  Pulse 76  Ht 5' 11.5" (1.816 m)  Wt 187 lb 4 oz (84.936 kg)  BMI 25.75 kg/m2 General: Well developed white male in no acute distress Head: Normocephalic and atraumatic Eyes:  Sclerae anicteric, conjunctiva pink. Ears: Normal auditory acuity Lungs: Clear throughout to auscultation Heart: Regular rate and rhythm Abdomen: Soft, non-distended.  Normal bowel sounds.  Non-tender. Rectal:  No external hemorrhoids noted.  No masses felt on DRE.  No stool or blood noted on exam glove. Musculoskeletal: Symmetrical with no gross deformities  Skin: No lesions on visible extremities Extremities: No edema  Neurological: Alert  oriented x 4, grossly non-focal Psychological:  Alert and cooperative. Normal mood and affect  ASSESSMENT AND PLAN: -Rectal bleeding:  Intermittent, low volume.  Very likely hemorrhoidal.  More serious etiology less likely with colonoscopy 3 years ago.  Will treat empirically with anusol suppositories BID for 7-10 days.  Patient will call back if bleeding worsens, becomes more frequent, etc.

## 2014-03-15 ENCOUNTER — Other Ambulatory Visit: Payer: Self-pay | Admitting: Family Medicine

## 2014-03-18 ENCOUNTER — Other Ambulatory Visit: Payer: Self-pay | Admitting: Orthopedic Surgery

## 2014-03-18 DIAGNOSIS — M25512 Pain in left shoulder: Secondary | ICD-10-CM

## 2014-03-29 LAB — HM DIABETES EYE EXAM

## 2014-03-30 ENCOUNTER — Other Ambulatory Visit: Payer: 59

## 2014-04-06 ENCOUNTER — Ambulatory Visit
Admission: RE | Admit: 2014-04-06 | Discharge: 2014-04-06 | Disposition: A | Discharge status: Home / Self Care | Payer: 59 | Source: Ambulatory Visit | Attending: Orthopedic Surgery | Admitting: Orthopedic Surgery

## 2014-04-06 DIAGNOSIS — M25512 Pain in left shoulder: Secondary | ICD-10-CM

## 2014-05-15 ENCOUNTER — Other Ambulatory Visit: Payer: Self-pay | Admitting: *Deleted

## 2014-05-15 MED ORDER — LISINOPRIL 2.5 MG PO TABS: 2.5000 mg | ORAL_TABLET | Freq: Every morning | ORAL | Status: DC

## 2014-05-22 ENCOUNTER — Other Ambulatory Visit: Payer: Self-pay | Admitting: Family Medicine

## 2014-05-28 ENCOUNTER — Other Ambulatory Visit: Payer: Self-pay | Admitting: Family Medicine

## 2014-06-07 ENCOUNTER — Other Ambulatory Visit: Payer: Self-pay | Admitting: Family Medicine

## 2014-06-07 NOTE — Telephone Encounter (Signed)
duplicate

## 2014-06-07 NOTE — Telephone Encounter (Signed)
This and 3 refills, OV in April

## 2014-06-07 NOTE — Telephone Encounter (Signed)
Last seen 02/08/14

## 2014-06-07 NOTE — Telephone Encounter (Signed)
This pt is completely out of his lyrica if we could send this as soon as possible today pts spouse will go to [facility] to pick it up this afternoon

## 2014-06-13 ENCOUNTER — Ambulatory Visit (INDEPENDENT_AMBULATORY_CARE_PROVIDER_SITE_OTHER): Payer: 59 | Admitting: Family Medicine

## 2014-06-13 ENCOUNTER — Encounter: Payer: Self-pay | Admitting: Family Medicine

## 2014-06-13 VITALS — Ht 73.0 in | Wt 189.0 lb

## 2014-06-13 DIAGNOSIS — G629 Polyneuropathy, unspecified: Secondary | ICD-10-CM

## 2014-06-13 DIAGNOSIS — E785 Hyperlipidemia, unspecified: Secondary | ICD-10-CM

## 2014-06-13 DIAGNOSIS — E119 Type 2 diabetes mellitus without complications: Secondary | ICD-10-CM

## 2014-06-13 DIAGNOSIS — E1142 Type 2 diabetes mellitus with diabetic polyneuropathy: Secondary | ICD-10-CM

## 2014-06-13 DIAGNOSIS — R1084 Generalized abdominal pain: Secondary | ICD-10-CM

## 2014-06-13 DIAGNOSIS — R109 Unspecified abdominal pain: Secondary | ICD-10-CM | POA: Insufficient documentation

## 2014-06-13 DIAGNOSIS — Z79899 Other long term (current) drug therapy: Secondary | ICD-10-CM

## 2014-06-13 DIAGNOSIS — E1342 Other specified diabetes mellitus with diabetic polyneuropathy: Secondary | ICD-10-CM

## 2014-06-13 LAB — POCT GLYCOSYLATED HEMOGLOBIN (HGB A1C): HEMOGLOBIN A1C: 6.9

## 2014-06-13 MED ORDER — LISINOPRIL 2.5 MG PO TABS: 2.5000 mg | ORAL_TABLET | Freq: Every morning | ORAL | Status: DC

## 2014-06-13 MED ORDER — PREGABALIN 100 MG PO CAPS: 75.0000 mg | ORAL_CAPSULE | Freq: Three times a day (TID) | ORAL | Status: DC

## 2014-06-13 MED ORDER — METFORMIN HCL 500 MG PO TABS: 500.0000 mg | ORAL_TABLET | Freq: Two times a day (BID) | ORAL | Status: DC

## 2014-06-13 MED ORDER — GLYBURIDE MICRONIZED 6 MG PO TABS: 6.0000 mg | ORAL_TABLET | Freq: Two times a day (BID) | ORAL | Status: DC

## 2014-06-13 MED ORDER — PRAVASTATIN SODIUM 20 MG PO TABS: 20.0000 mg | ORAL_TABLET | Freq: Every morning | ORAL | Status: DC

## 2014-06-13 MED ORDER — MELOXICAM 15 MG PO TABS: 15.0000 mg | ORAL_TABLET | Freq: Every day | ORAL | Status: DC

## 2014-06-13 NOTE — Progress Notes (Signed)
   Subjective:    Patient ID: Travis [name], male    DOB: [birth year], 54 y.o.   MRN: [id number]  Diabetes He presents for his follow-up diabetic visit. He has type 2 diabetes mellitus.  Does not check blood sugars.  Concerns about bilateral leg and foot pain. Ongoing.  Left shoulder pain. Taking tramadol. Suppose to schedule surgery.  Needs refill on lyrica. He relates a lot of tingling in his feet. Also some burning sensation in his feet States he has not done as well taking care of his diabetes. He does try to be somewhat conscious of his diet. Hot and cold the last few days. Additional review of systems patient denies any sweats chills nausea vomiting. No change in appetite. No weight loss. At times he is concerned that he has memory loss but he states he is able to go through the day remember what he supposed to do has not had any glaring examples. He does relate intermittent stomach pains come and go last few seconds at a time no change in bowel habits Review of Systems patient does relate left shoulder pain. Relates some urinary frequency. Denies any    Objective:   Physical Exam Neck no masses lungs are clear no crackles has a couple spots on the foot that he wanted looked at what appears to be a vein the other one could be a calcified vein I don't find any sign of tumors Subjective left shoulder discomfort Abdomen soft no guarding or rebound Blood pressure taken normal No edema in the legs Heart regular no murmur.       Assessment & Plan:  Diabetes the goal is 6.5 increase metformin watch diet stay physically active  Peripheral neuropathy increase Lyrica because of pain and discomfort  Intermittent stomach pains lab work ordered I doubt any type of colon cancer I don't feel the patient needs any type of scans if his stomach discomforts don't get better over the course of the next month he is to follow-up. It could be related to his metformin  Occasional memory disorder  patient doesn't feel he has this his wife does patient seen very coherent during today's exam  Spot on his feet appears to be a small blood vessel will be monitored  Shoulder pain discomfort patient was encouraged to consider getting his surgery done while he is overall healthy.  Hyperlipidemia check lipid profile, follow-up 4 months

## 2014-07-15 ENCOUNTER — Encounter: Payer: Self-pay | Admitting: Family Medicine

## 2014-07-15 ENCOUNTER — Ambulatory Visit (INDEPENDENT_AMBULATORY_CARE_PROVIDER_SITE_OTHER): Payer: 59 | Admitting: Family Medicine

## 2014-07-15 VITALS — BP 142/88 | Temp 98.8°F | Ht 73.0 in | Wt 186.4 lb

## 2014-07-15 DIAGNOSIS — B9689 Other specified bacterial agents as the cause of diseases classified elsewhere: Secondary | ICD-10-CM

## 2014-07-15 DIAGNOSIS — J111 Influenza due to unidentified influenza virus with other respiratory manifestations: Secondary | ICD-10-CM

## 2014-07-15 DIAGNOSIS — J019 Acute sinusitis, unspecified: Secondary | ICD-10-CM | POA: Diagnosis not present

## 2014-07-15 MED ORDER — OSELTAMIVIR PHOSPHATE 75 MG PO CAPS: 75.0000 mg | ORAL_CAPSULE | Freq: Two times a day (BID) | ORAL | Status: DC

## 2014-07-15 MED ORDER — AZITHROMYCIN 250 MG PO TABS: ORAL_TABLET | ORAL | Status: DC

## 2014-07-15 NOTE — Patient Instructions (Signed)

## 2014-07-15 NOTE — Progress Notes (Signed)
   Subjective:    Patient ID: Travis [name], male    DOB: May 05, 1960, 54 y.o.   MRN: [id number]  Cough This is a new problem. The current episode started in the past 7 days. Associated symptoms include a fever, headaches, nasal congestion, rhinorrhea, a sore throat and wheezing. Pertinent negatives include no chest pain or ear pain. Associated symptoms comments: Diarrhea, vomiting. Treatments tried: tylenol cold and flu.   This all hit him over the past couple days worse over the past 24 hours also some sharp pains on the left side the chest not respiratory distress   Review of Systems  Constitutional: Positive for fever. Negative for activity change.  HENT: Positive for congestion, rhinorrhea and sore throat. Negative for ear pain.   Eyes: Negative for discharge.  Respiratory: Positive for cough and wheezing.   Cardiovascular: Negative for chest pain.  Neurological: Positive for headaches.       Objective:   Physical Exam  Constitutional: He appears well-developed.  HENT:  Head: Normocephalic.  Mouth/Throat: Oropharynx is clear and moist. No oropharyngeal exudate.  Neck: Normal range of motion.  Cardiovascular: Normal rate, regular rhythm and normal heart sounds.   No murmur heard. Pulmonary/Chest: Effort normal and breath sounds normal. He has no wheezes.  Lymphadenopathy:    He has no cervical adenopathy.  Neurological: He exhibits normal muscle tone.  Skin: Skin is warm and dry.  Nursing note and vitals reviewed.         Assessment & Plan:  Influenza-the patient was diagnosed with influenza. Patient/family educated about the flu and warning signs to watch for. If difficulty breathing, severe neck pain and stiffness, cyanosis, disorientation, or progressive worsening then immediately get rechecked at that ER. If progressive symptoms be certain to be rechecked. Supportive measures such as Tylenol/ibuprofen was discussed. No aspirin use in children. And influenza home care  instruction sheet was given.  Felt to have the flu with possible secondary sinusitis no sign and pneumonia currently Tamiflu and antibiotics sent in warning signs discussed.

## 2014-07-22 ENCOUNTER — Other Ambulatory Visit: Payer: Self-pay | Admitting: Orthopedic Surgery

## 2014-07-25 ENCOUNTER — Encounter (HOSPITAL_BASED_OUTPATIENT_CLINIC_OR_DEPARTMENT_OTHER): Payer: Self-pay | Admitting: *Deleted

## 2014-07-25 NOTE — Progress Notes (Signed)
States he cannot get off work for labs-will come in 2hr early [place] Will bring all meds ,overnight bag-bled post op last shoulder-wants to stay Bring all meds Had flu 2 weeks ago-better

## 2014-07-25 NOTE — Progress Notes (Signed)
   07/25/14 1312  OBSTRUCTIVE SLEEP APNEA  Have you ever been diagnosed with sleep apnea through a sleep study? No  Do you snore loudly (loud enough to be heard through closed doors)?  1  Do you often feel tired, fatigued, or sleepy during the daytime? 0  Has anyone observed you stop breathing during your sleep? 0  Do you have, or are you being treated for high blood pressure? 1  BMI more than 35 kg/m2? 0  Age over 56 years old? 1  Gender: 1

## 2014-08-02 ENCOUNTER — Ambulatory Visit (HOSPITAL_BASED_OUTPATIENT_CLINIC_OR_DEPARTMENT_OTHER): Payer: 59 | Admitting: Anesthesiology

## 2014-08-02 ENCOUNTER — Encounter (HOSPITAL_BASED_OUTPATIENT_CLINIC_OR_DEPARTMENT_OTHER)
Admission: RE | Disposition: A | Discharge status: Home / Self Care | Payer: Self-pay | Source: Ambulatory Visit | Attending: Orthopedic Surgery

## 2014-08-02 ENCOUNTER — Ambulatory Visit (HOSPITAL_BASED_OUTPATIENT_CLINIC_OR_DEPARTMENT_OTHER)
Admission: RE | Admit: 2014-08-02 | Discharge: 2014-08-02 | Disposition: A | Discharge status: Home / Self Care | Payer: 59 | Source: Ambulatory Visit | Attending: Orthopedic Surgery | Admitting: Orthopedic Surgery

## 2014-08-02 ENCOUNTER — Encounter (HOSPITAL_BASED_OUTPATIENT_CLINIC_OR_DEPARTMENT_OTHER): Payer: Self-pay | Admitting: *Deleted

## 2014-08-02 DIAGNOSIS — E119 Type 2 diabetes mellitus without complications: Secondary | ICD-10-CM | POA: Insufficient documentation

## 2014-08-02 DIAGNOSIS — E1142 Type 2 diabetes mellitus with diabetic polyneuropathy: Secondary | ICD-10-CM | POA: Insufficient documentation

## 2014-08-02 DIAGNOSIS — M75102 Unspecified rotator cuff tear or rupture of left shoulder, not specified as traumatic: Secondary | ICD-10-CM | POA: Insufficient documentation

## 2014-08-02 DIAGNOSIS — K5730 Diverticulosis of large intestine without perforation or abscess without bleeding: Secondary | ICD-10-CM | POA: Insufficient documentation

## 2014-08-02 DIAGNOSIS — M7502 Adhesive capsulitis of left shoulder: Secondary | ICD-10-CM | POA: Diagnosis present

## 2014-08-02 DIAGNOSIS — I10 Essential (primary) hypertension: Secondary | ICD-10-CM | POA: Insufficient documentation

## 2014-08-02 DIAGNOSIS — Z87891 Personal history of nicotine dependence: Secondary | ICD-10-CM | POA: Insufficient documentation

## 2014-08-02 DIAGNOSIS — E785 Hyperlipidemia, unspecified: Secondary | ICD-10-CM | POA: Diagnosis not present

## 2014-08-02 DIAGNOSIS — M24112 Other articular cartilage disorders, left shoulder: Secondary | ICD-10-CM | POA: Diagnosis not present

## 2014-08-02 DIAGNOSIS — M7542 Impingement syndrome of left shoulder: Secondary | ICD-10-CM | POA: Diagnosis present

## 2014-08-02 HISTORY — DX: Impingement syndrome of left shoulder: M75.42

## 2014-08-02 HISTORY — DX: Adhesive capsulitis of left shoulder: M75.02

## 2014-08-02 HISTORY — DX: Other complications of anesthesia, initial encounter: T88.59XA

## 2014-08-02 HISTORY — PX: SHOULDER ARTHROSCOPY: SHX128

## 2014-08-02 HISTORY — DX: Adverse effect of unspecified anesthetic, initial encounter: T41.45XA

## 2014-08-02 HISTORY — DX: Essential (primary) hypertension: I10

## 2014-08-02 LAB — POCT I-STAT, CHEM 8
BUN: 13 mg/dL (ref 6–23)
CHLORIDE: 107 mmol/L (ref 96–112)
CREATININE: 0.8 mg/dL (ref 0.50–1.35)
Calcium, Ion: 1.25 mmol/L — ABNORMAL HIGH (ref 1.12–1.23)
Glucose, Bld: 116 mg/dL — ABNORMAL HIGH (ref 70–99)
HCT: 41 % (ref 39.0–52.0)
Hemoglobin: 13.9 g/dL (ref 13.0–17.0)
Potassium: 4 mmol/L (ref 3.5–5.1)
Sodium: 143 mmol/L (ref 135–145)
TCO2: 22 mmol/L (ref 0–100)

## 2014-08-02 LAB — GLUCOSE, CAPILLARY: GLUCOSE-CAPILLARY: 93 mg/dL (ref 70–99)

## 2014-08-02 SURGERY — ARTHROSCOPY, SHOULDER
Anesthesia: Regional | Site: Shoulder | Laterality: Left

## 2014-08-02 MED ORDER — LIDOCAINE HCL (CARDIAC) 20 MG/ML IV SOLN
INTRAVENOUS | Status: DC | PRN
  Administered 2014-08-02: 60 mg via INTRAVENOUS

## 2014-08-02 MED ORDER — OXYCODONE HCL 5 MG PO TABS
ORAL_TABLET | ORAL | Status: AC
  Filled 2014-08-02: qty 1

## 2014-08-02 MED ORDER — FENTANYL CITRATE 0.05 MG/ML IJ SOLN
INTRAMUSCULAR | Status: AC
  Filled 2014-08-02: qty 2

## 2014-08-02 MED ORDER — ONDANSETRON HCL 4 MG/2ML IJ SOLN
INTRAMUSCULAR | Status: DC | PRN
  Administered 2014-08-02: 4 mg via INTRAVENOUS

## 2014-08-02 MED ORDER — MIDAZOLAM HCL 2 MG/2ML IJ SOLN
INTRAMUSCULAR | Status: AC
  Filled 2014-08-02: qty 2

## 2014-08-02 MED ORDER — FENTANYL CITRATE 0.05 MG/ML IJ SOLN
50.0000 ug | INTRAMUSCULAR | Status: DC | PRN
  Administered 2014-08-02: 100 ug via INTRAVENOUS

## 2014-08-02 MED ORDER — SUCCINYLCHOLINE CHLORIDE 20 MG/ML IJ SOLN
INTRAMUSCULAR | Status: DC | PRN
  Administered 2014-08-02: 100 mg via INTRAVENOUS

## 2014-08-02 MED ORDER — DEXAMETHASONE SODIUM PHOSPHATE 4 MG/ML IJ SOLN
INTRAMUSCULAR | Status: DC | PRN
  Administered 2014-08-02: 5 mg via INTRAVENOUS

## 2014-08-02 MED ORDER — CEFAZOLIN SODIUM-DEXTROSE 2-3 GM-% IV SOLR
2.0000 g | INTRAVENOUS | Status: AC
  Administered 2014-08-02: 2 g via INTRAVENOUS

## 2014-08-02 MED ORDER — MIDAZOLAM HCL 2 MG/2ML IJ SOLN
1.0000 mg | INTRAMUSCULAR | Status: DC | PRN
  Administered 2014-08-02: 2 mg via INTRAVENOUS

## 2014-08-02 MED ORDER — FENTANYL CITRATE 0.05 MG/ML IJ SOLN
25.0000 ug | INTRAMUSCULAR | Status: DC | PRN
  Administered 2014-08-02 (×2): 50 ug via INTRAVENOUS

## 2014-08-02 MED ORDER — ONDANSETRON HCL 4 MG/2ML IJ SOLN: 4.0000 mg | Freq: Once | INTRAMUSCULAR | Status: DC | PRN

## 2014-08-02 MED ORDER — CEFAZOLIN SODIUM-DEXTROSE 2-3 GM-% IV SOLR
INTRAVENOUS | Status: AC
  Filled 2014-08-02: qty 50

## 2014-08-02 MED ORDER — PROPOFOL 10 MG/ML IV BOLUS
INTRAVENOUS | Status: DC | PRN
  Administered 2014-08-02: 200 mg via INTRAVENOUS

## 2014-08-02 MED ORDER — OXYCODONE-ACETAMINOPHEN 10-325 MG PO TABS: 1.0000 | ORAL_TABLET | Freq: Four times a day (QID) | ORAL | Status: DC | PRN

## 2014-08-02 MED ORDER — FENTANYL CITRATE 0.05 MG/ML IJ SOLN
INTRAMUSCULAR | Status: AC
  Filled 2014-08-02: qty 6

## 2014-08-02 MED ORDER — SENNA-DOCUSATE SODIUM 8.6-50 MG PO TABS: 2.0000 | ORAL_TABLET | Freq: Every day | ORAL | Status: DC

## 2014-08-02 MED ORDER — ONDANSETRON HCL 4 MG PO TABS: 4.0000 mg | ORAL_TABLET | Freq: Three times a day (TID) | ORAL | Status: DC | PRN

## 2014-08-02 MED ORDER — FENTANYL CITRATE 0.05 MG/ML IJ SOLN
INTRAMUSCULAR | Status: AC
Start: 2014-08-02 — End: 2014-08-02
  Filled 2014-08-02: qty 2

## 2014-08-02 MED ORDER — OXYCODONE HCL 5 MG PO TABS
5.0000 mg | ORAL_TABLET | Freq: Once | ORAL | Status: AC | PRN
  Administered 2014-08-02: 5 mg via ORAL

## 2014-08-02 MED ORDER — BACLOFEN 10 MG PO TABS
10.0000 mg | ORAL_TABLET | Freq: Three times a day (TID) | ORAL | Status: DC
Start: 2014-08-02 — End: 2016-05-02

## 2014-08-02 MED ORDER — LACTATED RINGERS IV SOLN
INTRAVENOUS | Status: DC
  Administered 2014-08-02 (×2): via INTRAVENOUS

## 2014-08-02 MED ORDER — OXYCODONE HCL 5 MG/5ML PO SOLN: 5.0000 mg | Freq: Once | ORAL | Status: AC | PRN

## 2014-08-02 MED ORDER — SODIUM CHLORIDE 0.9 % IR SOLN
Status: DC | PRN
  Administered 2014-08-02: 6000 mL

## 2014-08-02 SURGICAL SUPPLY — 64 items
BLADE CUTTER GATOR 3.5 (BLADE) ×3 IMPLANT
BLADE GREAT WHITE 4.2 (BLADE) IMPLANT
BLADE GREAT WHITE 4.2MM (BLADE)
BLADE SURG 15 STRL LF DISP TIS (BLADE) IMPLANT
BLADE SURG 15 STRL SS (BLADE)
BUR OVAL 6.0 (BURR) ×2 IMPLANT
CANNULA 5.75X71 LONG (CANNULA) ×3 IMPLANT
CANNULA TWIST IN 8.25X7CM (CANNULA) IMPLANT
CLOSURE STERI-STRIP 1/2X4 (GAUZE/BANDAGES/DRESSINGS) ×1
CLSR STERI-STRIP ANTIMIC 1/2X4 (GAUZE/BANDAGES/DRESSINGS) ×2 IMPLANT
DECANTER SPIKE VIAL GLASS SM (MISCELLANEOUS) IMPLANT
DRAPE INCISE IOBAN 66X45 STRL (DRAPES) ×3 IMPLANT
DRAPE SHOULDER BEACH CHAIR (DRAPES) ×3 IMPLANT
DRAPE U 20/CS (DRAPES) ×3 IMPLANT
DRAPE U-SHAPE 47X51 STRL (DRAPES) ×3 IMPLANT
DRSG PAD ABDOMINAL 8X10 ST (GAUZE/BANDAGES/DRESSINGS) ×3 IMPLANT
DURAPREP 26ML APPLICATOR (WOUND CARE) ×3 IMPLANT
ELECT REM PT RETURN 9FT ADLT (ELECTROSURGICAL)
ELECTRODE REM PT RTRN 9FT ADLT (ELECTROSURGICAL) IMPLANT
FIBERSTICK 2 (SUTURE) IMPLANT
GAUZE SPONGE 4X4 12PLY STRL (GAUZE/BANDAGES/DRESSINGS) ×3 IMPLANT
GLOVE BIO SURGEON STRL SZ8 (GLOVE) ×3 IMPLANT
GLOVE BIOGEL PI IND STRL 7.5 (GLOVE) IMPLANT
GLOVE BIOGEL PI IND STRL 8 (GLOVE) ×2 IMPLANT
GLOVE BIOGEL PI INDICATOR 7.5 (GLOVE) ×4
GLOVE BIOGEL PI INDICATOR 8 (GLOVE) ×4
GLOVE ORTHO TXT STRL SZ7.5 (GLOVE) ×3 IMPLANT
GLOVE SURG SS PI 7.5 STRL IVOR (GLOVE) ×2 IMPLANT
GOWN STRL REUS W/ TWL LRG LVL3 (GOWN DISPOSABLE) ×1 IMPLANT
GOWN STRL REUS W/ TWL XL LVL3 (GOWN DISPOSABLE) ×2 IMPLANT
GOWN STRL REUS W/TWL LRG LVL3 (GOWN DISPOSABLE) ×3
GOWN STRL REUS W/TWL XL LVL3 (GOWN DISPOSABLE) ×6
IMMOBILIZER SHOULDER FOAM XLGE (SOFTGOODS) IMPLANT
KIT SHOULDER TRACTION (DRAPES) ×3 IMPLANT
LASSO 90 CVE QUICKPAS (DISPOSABLE) IMPLANT
MANIFOLD NEPTUNE II (INSTRUMENTS) ×3 IMPLANT
NDL SCORPION MULTI FIRE (NEEDLE) IMPLANT
NEEDLE SCORPION MULTI FIRE (NEEDLE) IMPLANT
PACK ARTHROSCOPY DSU (CUSTOM PROCEDURE TRAY) ×3 IMPLANT
PACK BASIN DAY SURGERY FS (CUSTOM PROCEDURE TRAY) ×3 IMPLANT
SET ARTHROSCOPY TUBING (MISCELLANEOUS) ×3
SET ARTHROSCOPY TUBING LN (MISCELLANEOUS) ×1 IMPLANT
SHEET MEDIUM DRAPE 40X70 STRL (DRAPES) ×3 IMPLANT
SLEEVE SCD COMPRESS KNEE MED (MISCELLANEOUS) ×3 IMPLANT
SLING ARM IMMOBILIZER LRG (SOFTGOODS) IMPLANT
SLING ARM IMMOBILIZER MED (SOFTGOODS) IMPLANT
SLING ARM LRG ADULT FOAM STRAP (SOFTGOODS) ×2 IMPLANT
SLING ARM MED ADULT FOAM STRAP (SOFTGOODS) IMPLANT
SLING ARM XL FOAM STRAP (SOFTGOODS) IMPLANT
SUT FIBERWIRE #2 38 T-5 BLUE (SUTURE)
SUT MNCRL AB 4-0 PS2 18 (SUTURE) ×3 IMPLANT
SUT PDS AB 1 CT  36 (SUTURE)
SUT PDS AB 1 CT 36 (SUTURE) IMPLANT
SUT TIGER TAPE 7 IN WHITE (SUTURE) IMPLANT
SUT VIC AB 3-0 SH 27 (SUTURE)
SUT VIC AB 3-0 SH 27X BRD (SUTURE) IMPLANT
SUTURE FIBERWR #2 38 T-5 BLUE (SUTURE) IMPLANT
TAPE FIBER 2MM 7IN #2 BLUE (SUTURE) IMPLANT
TOWEL OR 17X24 6PK STRL BLUE (TOWEL DISPOSABLE) ×3 IMPLANT
TOWEL OR NON WOVEN STRL DISP B (DISPOSABLE) ×5 IMPLANT
TUBE CONNECTING 20'X1/4 (TUBING)
TUBE CONNECTING 20X1/4 (TUBING) IMPLANT
WAND STAR VAC 90 (SURGICAL WAND) ×3 IMPLANT
WATER STERILE IRR 1000ML POUR (IV SOLUTION) ×3 IMPLANT

## 2014-08-02 NOTE — Progress Notes (Signed)
Assisted Dr. Joslin with left, ultrasound guided, interscalene  block. Side rails up, monitors on throughout procedure. See vital signs in flow sheet. Tolerated Procedure well. 

## 2014-08-02 NOTE — Discharge Instructions (Signed)
Post Anesthesia Home Care Instructions  Activity: Get plenty of rest for the remainder of the day. A responsible adult should stay with you for 24 hours following the procedure.  For the next 24 hours, DO NOT: -Drive a car -Paediatric nurse -Drink alcoholic beverages -Take any medication unless instructed by your physician -Make any legal decisions or sign important papers.  Meals: Start with liquid foods such as gelatin or soup. Progress to regular foods as tolerated. Avoid greasy, spicy, heavy foods. If nausea and/or vomiting occur, drink only clear liquids until the nausea and/or vomiting subsides. Call your physician if vomiting continues.  Special Instructions/Symptoms: Your throat may feel dry or sore from the anesthesia or the breathing tube placed in your throat during surgery. If this causes discomfort, gargle with warm salt water. The discomfort should disappear within 24 hours.  If you had a scopolamine patch placed behind your ear for the management of post- operative nausea and/or vomiting:  1. The medication in the patch is effective for 72 hours, after which it should be removed.  Wrap patch in a tissue and discard in the trash. Wash hands thoroughly with soap and water. 2. You may remove the patch earlier than 72 hours if you experience unpleasant side effects which may include dry mouth, dizziness or visual disturbances. 3. Avoid touching the patch. Wash your hands with soap and water after contact with the patch.    Regional Anesthesia Blocks  1. Numbness or the inability to move the "blocked" extremity may last from 3-48 hours after placement. The length of time depends on the medication injected and your individual response to the medication. If the numbness is not going away after 48 hours, call your surgeon.  2. The extremity that is blocked will need to be protected until the numbness is gone and the  Strength has returned. Because you cannot feel it, you will  need to take extra care to avoid injury. Because it may be weak, you may have difficulty moving it or using it. You may not know what position it is in without looking at it while the block is in effect.  3. For blocks in the legs and feet, returning to weight bearing and walking needs to be done carefully. You will need to wait until the numbness is entirely gone and the strength has returned. You should be able to move your leg and foot normally before you try and bear weight or walk. You will need someone to be with you when you first try to ensure you do not fall and possibly risk injury.  4. Bruising and tenderness at the needle site are common side effects and will resolve in a few days.  5. Persistent numbness or new problems with movement should be communicated to the surgeon or the [facility] [phone] [place] [phone]).    Diet: As you were doing prior to hospitalization   Shower:  May shower but keep the wounds dry, use an occlusive plastic wrap, NO SOAKING IN TUB.  If the bandage gets wet, change with a clean dry gauze.  Dressing:  You may change your dressing 3-5 days after surgery.  Then change the dressing daily with sterile gauze dressing.    There are sticky tapes (steri-strips) on your wounds and all the stitches are absorbable.  Leave the steri-strips in place when changing your dressings, they will peel off with time, usually 2-3 weeks.  Activity:  Increase activity slowly as tolerated,  but follow the weight bearing instructions below.  No lifting or driving for 6 weeks.  Weight Bearing:   Ok to use arm as tolerated..    To prevent constipation: you may use a stool softener such as -  Colace (over the counter) 100 mg by mouth twice a day  Drink plenty of fluids (prune juice may be helpful) and high fiber foods Miralax (over the counter) for constipation as needed.    Itching:  If you experience itching with your medications, try  taking only a single pain pill, or even half a pain pill at a time.  You may take up to 10 pain pills per day, and you can also use benadryl over the counter for itching or also to help with sleep.   Precautions:  If you experience chest pain or shortness of breath - call 911 immediately for transfer to the hospital emergency department!!  If you develop a fever greater that 101 F, purulent drainage from wound, increased redness or drainage from wound, or calf pain -- Call the office at [phone]                                                Follow- Up Appointment:  Please call for an appointment to be seen in 2 weeks [place] - [phone]

## 2014-08-02 NOTE — Op Note (Signed)
08/02/2014  2:54 PM  PATIENT:  Travis [name]    PRE-OPERATIVE DIAGNOSIS:  Left shoulder impingement syndrome, partial-thickness rotator cuff tear, possible adhesive capsulitis  POST-OPERATIVE DIAGNOSIS:  Left shoulder impingement syndrome with subacromial spurring, CA ligament fraying, thickened hemorrhagic bursitis, with adhesive capsulitis, 20% undersurface supraspinatus tear  PROCEDURE:  Left shoulder arthroscopy with extensive debridement, acromioplasty, manipulation under anesthesia.  SURGEON:  Johnny Bridge, MD  PHYSICIAN ASSISTANT: Joya Gaskins, OPA-C, present and scrubbed throughout the case, critical for completion in a timely fashion, and for retraction, instrumentation, and closure.  ANESTHESIA:   General  PREOPERATIVE INDICATIONS:  Travis [name] is a  54 y.o. male who had persistent left shoulder pain, failed conservative measures including exercises, activity modification, anti-inflammatories, and elected for surgical management. He's had similar type problem on the contralateral side and responded very well to arthroscopic surgery.  The risks benefits and alternatives were discussed with the patient preoperatively including but not limited to the risks of infection, bleeding, nerve injury, cardiopulmonary complications, the need for revision surgery, among others, and the patient was willing to proceed.  OPERATIVE IMPLANTS: None  OPERATIVE FINDINGS: The shoulder lacked about 20 of range of motion during examination under anesthesia and manipulation actually yielded significant lysis of adhesions. External rotation was to 60, the majority of the limitation was with forward flexion. Crossarm abduction was intact.  The articular surfaces demonstrated some mild chondral changes on the glenoid, but nothing severe. The biceps is actually not present, may have ruptured previously. It was a significant amount of synovitis of hemorrhagic injection of the capsule as well as the  bursa consistent with adhesive capsulitis. Subscapularis was intact. The supraspinous cable was excellent, although there was about 20% undersurface fraying of the supraspinatus which was debrided. Infraspinatus and bare spot were normal. The labrum had some moderate fraying posteriorly which was also debrided. Superiorly there is no significant labral tearing. Again there was no biceps to insert.  In the subacromial space there was a thickened bursa, CA ligament fraying, as well as reasonably substantial undersurface acromial spurring. The rotator cuff was completely intact from above.  OPERATIVE PROCEDURE: The patient was brought to the operating room and placed in supine position. Gen. anesthesia was administered. IV antibiotics were given. Examination under anesthesia demonstrated some stiffness and manipulation yielded lysis of adhesions.  He was turned in a semilateral decubitus position and all bony prominences padded. Time out performed. The left upper extremity was prepped and draped in usual sterile fashion. Diagnostic arthroscopy carried out with the above-named findings. He's the Shaver to debride the anterior labrum, as well as any torn tissue within the joint. I also debrided the undersurface of the supraspinatus. This was back to a stable configuration.  I then went to subacromial space and performed a complete bursectomy, CA ligament release, and performed an acromioplasty with a bur. I viewed from the lateral side, touched up the acromioplasty from posteriorly, and pleaded the diagnostic arthroscopy ensuring that there was no significant rotator cuff pathology remaining.  The instruments were removed, the portals closed with Monocryl followed by Steri-Strips and sterile gauze.  If he continues to have significant symptoms then we may give consideration to further evaluation of his neck, however he structurally looks to be in reasonably good condition as far as the shoulder goes at this  point.

## 2014-08-02 NOTE — Anesthesia Procedure Notes (Addendum)
Anesthesia Regional Block:  Supraclavicular block  Pre-Anesthetic Checklist: ,, timeout performed, Correct Patient, Correct Site, Correct Laterality, Correct Procedure, Correct Position, site marked, Risks and benefits discussed,  Surgical consent,  Pre-op evaluation,  At surgeon's request and post-op pain management  Laterality: Left  Prep: chloraprep       Needles:  Injection technique: Single-shot  Needle Type: Echogenic Stimulator Needle     Needle Length: 9cm 9 cm Needle Gauge: 22 and 22 G    Additional Needles:  Procedures: ultrasound guided (picture in chart) Supraclavicular block Narrative:  Start time: 08/02/2014 1:20 PM End time: 08/02/2014 1:25 PM Injection made incrementally with aspirations every 5 mL.  Performed by: Personally   Additional Notes: 30 cc 0.5% marcaine with 1:200 epi injected easily   Procedure Name: Intubation Date/Time: 08/02/2014 1:50 PM Performed by: Maryella Shivers Pre-anesthesia Checklist: Patient identified, Emergency Drugs available, Suction available and Patient being monitored Patient Re-evaluated:Patient Re-evaluated prior to inductionOxygen Delivery Method: Circle System Utilized Preoxygenation: Pre-oxygenation with 100% oxygen Intubation Type: IV induction Ventilation: Mask ventilation without difficulty Laryngoscope Size: Mac and 3 Grade View: Grade I Tube type: Oral Tube size: 8.0 mm Number of attempts: 1 Airway Equipment and Method: Stylet and Oral airway Placement Confirmation: ETT inserted through vocal cords under direct vision,  positive ETCO2 and breath sounds checked- equal and bilateral Secured at: 22 cm Tube secured with: Tape Dental Injury: Teeth and Oropharynx as per pre-operative assessment

## 2014-08-02 NOTE — Anesthesia Preprocedure Evaluation (Signed)
Anesthesia Evaluation  Patient identified by MRN, date of birth, ID band Patient awake    Reviewed: Allergy & Precautions, NPO status , Patient's Chart, lab work & pertinent test results  Airway Mallampati: II  TM Distance: >3 FB Neck ROM: Full    Dental  (+) Teeth Intact, Dental Advisory Given   Pulmonary former smoker,  breath sounds clear to auscultation        Cardiovascular hypertension, Rhythm:Regular Rate:Normal     Neuro/Psych    GI/Hepatic   Endo/Other  diabetes  Renal/GU      Musculoskeletal   Abdominal   Peds  Hematology   Anesthesia Other Findings   Reproductive/Obstetrics                             Anesthesia Physical Anesthesia Plan  ASA: III  Anesthesia Plan: General   Post-op Pain Management: MAC Combined w/ Regional for Post-op pain   Induction: Intravenous  Airway Management Planned: Oral ETT  Additional Equipment:   Intra-op Plan:   Post-operative Plan: Extubation in OR  Informed Consent: I have reviewed the patients History and Physical, chart, labs and discussed the procedure including the risks, benefits and alternatives for the proposed anesthesia with the patient or authorized representative who has indicated his/her understanding and acceptance.   Dental advisory given  Plan Discussed with: CRNA and Anesthesiologist  Anesthesia Plan Comments: (L Rotator cuff tear Type 2 DM Htn  Plan GA with oral ETT  Roberts Gaudy)        Anesthesia Quick Evaluation

## 2014-08-02 NOTE — Transfer of Care (Signed)
Immediate Anesthesia Transfer of Care Note  Patient: Travis [name]  Procedure(s) Performed: Procedure(s) with comments: LEFT SHOULDER SCOPE DEBRIDEMENT/ACROMIOPLASTY (Left) - ANESTHESIA: GENERAL, PRE/POST OP SCALENE  Patient Location: PACU  Anesthesia Type:General and Regional  Level of Consciousness: sedated  Airway & Oxygen Therapy: Patient Spontanous Breathing and Patient connected to face mask oxygen  Post-op Assessment: Report given to RN and Post -op Vital signs reviewed and stable  Post vital signs: Reviewed and stable  Last Vitals:  Filed Vitals:   08/02/14 1310  BP: 136/91  Pulse: 75  Temp:   Resp: 11    Complications: No apparent anesthesia complications

## 2014-08-02 NOTE — H&P (Signed)
PREOPERATIVE H&P  Chief Complaint: Left shoulder pain  HPI: Travis [name] is a 54 y.o. male who presents for preoperative history and physical with a diagnosis of left shoulder pain, impingement syndrome versus full-thickness tear versus adhesive capsulitis. Symptoms are rated as moderate to severe, and have been worsening.  This is significantly impairing activities of daily living.  He has elected for surgical management. He has had injections, and reports pain 10/10, wakes him up all night long, limited activities of daily living. Sometimes putting the arm overhead helps, sometimes it makes him worse. He has difficulty with lifting. He also has known cervical spine pathology, and has had neck surgery before, and he does not feel like this is his neck. He does have a past history of neuropathy.  Past Medical History  Diagnosis Date  . Night sweats     every once in a while  . Hyperlipidemia   . Diabetes mellitus   . Hernia, inguinal   . Dizziness   . Chronic headaches   . Arthritis   . Neuropathy of lower extremity   . Diabetic peripheral neuropathy 03/28/2013  . Diverticulosis   . Hypertension   . Complication of anesthesia     bleed after last shoulder-had to stay overnight   Past Surgical History  Procedure Laterality Date  . Inguinal hernia repair Bilateral   . Cervical disc surgery    . Wrist surgery Right     fusion  . Shoulder arthroscopy Right     1  . Knee arthroscopy Right   . Colon surgery  2003    1/3 removed for diverticulitis  . Colonoscopy    . Umbilical hernia repair      with other hernia repair with mesh   History   Social History  . Marital Status: Married    Spouse Name: N/A  . Number of Children: 2  . Years of Education: N/A   Social History Main Topics  . Smoking status: Former Smoker    Types: Cigarettes  . Smokeless tobacco: Former Systems developer    Types: [place] date: 05/03/1978  . Alcohol Use: 0.6 oz/week    1 Cans of beer per week   Comment: occasional shot of taquila  . Drug Use: No  . Sexual Activity: Not on file   Other Topics Concern  . None   Social History Narrative   Family History  Problem Relation Age of Onset  . Stroke Father   . Hyperlipidemia Father   . Diabetes Maternal Grandfather   . Heart attack Sister 46  . ALS Brother 52   No Known Allergies Prior to Admission medications   Medication Sig Start Date End Date Taking? Authorizing Provider  traMADol (ULTRAM) 50 MG tablet Take by mouth every 6 (six) hours as needed.   Yes Historical Provider, MD  aspirin 81 MG tablet Take 81 mg by mouth daily.      Historical Provider, MD  Cholecalciferol (VITAMIN D) 400 UNITS capsule Take 400 Units by mouth daily.      Historical Provider, MD  Cyanocobalamin (VITAMIN B12 PO) Take by mouth daily.      Historical Provider, MD  glyBURIDE micronized (GLYNASE) 6 MG tablet Take 1 tablet (6 mg total) by mouth 2 (two) times daily. 06/13/14   Kathyrn Drown, MD  lisinopril (PRINIVIL,ZESTRIL) 2.5 MG tablet Take 1 tablet (2.5 mg total) by mouth every morning. 06/13/14   Kathyrn Drown, MD  LORazepam (ATIVAN) 1 MG  tablet Take one tablet one hour before procedure 12/31/13   Kathyrn Drown, MD  meloxicam (MOBIC) 15 MG tablet Take 1 tablet (15 mg total) by mouth daily. 06/13/14   Kathyrn Drown, MD  metFORMIN (GLUCOPHAGE) 500 MG tablet Take 1 tablet (500 mg total) by mouth 2 (two) times daily with a meal. 06/13/14   Kathyrn Drown, MD  pravastatin (PRAVACHOL) 20 MG tablet Take 1 tablet (20 mg total) by mouth every morning. 06/13/14   Kathyrn Drown, MD  pregabalin (LYRICA) 100 MG capsule Take 1 capsule (100 mg total) by mouth 3 (three) times daily. 06/13/14   Kathyrn Drown, MD  valACYclovir (VALTREX) 1000 MG tablet TAKE 1 TABLET BY MOUTH ONCE DAILY 09/21/13   Kathyrn Drown, MD     Positive ROS: All other systems have been reviewed and were otherwise negative with the exception of those mentioned in the HPI and as above.  Physical  Exam: General: Alert, no acute distress Cardiovascular: No pedal edema Respiratory: No cyanosis, no use of accessory musculature GI: No organomegaly, abdomen is soft and non-tender Skin: No lesions in the area of chief complaint Neurologic: Sensation intact distally Psychiatric: Patient is competent for consent with normal mood and affect Lymphatic: No axillary or cervical lymphadenopathy  MUSCULOSKELETAL: Left shoulder active motion is 0-100, he might be able to get higher although overhead activity is limited secondary to pain. He has positive impingement signs, no pain over the before meals joint, grip strength is fair.  Assessment: Left shoulder impingement syndrome with question full-thickness rotator cuff tear, supraspinatus tendinopathy, with coexisting cervical spine disease.  Plan: Plan for Procedure(s): LEFT SHOULDER SCOPE DEBRIDEMENT/ACROMIOPLASTY/POSSIBLE ROTATOR CUFF REPAIR  The risks benefits and alternatives were discussed with the patient including but not limited to the risks of nonoperative treatment, versus surgical intervention including infection, bleeding, nerve injury,  blood clots, cardiopulmonary complications, morbidity, mortality, among others, and they were willing to proceed.   He did get relief from the subacromial injection which should support the fact that his shoulder may be the source of the symptoms. Having said that, the severity of his symptoms as well as the fact that he gets some relief sometimes when he gets his arm overhead makes me concerned might be his neck. He wants to proceed with shoulder surgery, similar to the way that he had surgery on the contralateral side. Hopefully we will be able to achieve some degree of pain relief with arthroscopic intervention, otherwise we may need to pursue cervical etiology further.  Johnny Bridge, MD Cell [phone]   08/02/2014 7:37 AM

## 2014-08-02 NOTE — Anesthesia Postprocedure Evaluation (Signed)
Anesthesia Post Note  Patient: Travis [name]  Procedure(s) Performed: Procedure(s) (LRB): LEFT SHOULDER SCOPE DEBRIDEMENT/ACROMIOPLASTY (Left)  Anesthesia type: General  Patient location: PACU  Post pain: Pain level controlled and Adequate analgesia  Post assessment: Post-op Vital signs reviewed, Patient's Cardiovascular Status Stable, Respiratory Function Stable, Patent Airway and Pain level controlled  Last Vitals:  Filed Vitals:   08/02/14 1545  BP: 128/78  Pulse: 72  Temp:   Resp: 16    Post vital signs: Reviewed and stable  Level of consciousness: awake, alert  and oriented  Complications: No apparent anesthesia complications

## 2014-08-06 ENCOUNTER — Encounter (HOSPITAL_BASED_OUTPATIENT_CLINIC_OR_DEPARTMENT_OTHER): Payer: Self-pay | Admitting: Orthopedic Surgery

## 2014-08-19 ENCOUNTER — Ambulatory Visit (HOSPITAL_COMMUNITY): Payer: 59 | Attending: Orthopedic Surgery

## 2014-08-19 ENCOUNTER — Encounter (HOSPITAL_COMMUNITY): Payer: Self-pay

## 2014-08-19 DIAGNOSIS — M25512 Pain in left shoulder: Secondary | ICD-10-CM | POA: Diagnosis not present

## 2014-08-19 DIAGNOSIS — E119 Type 2 diabetes mellitus without complications: Secondary | ICD-10-CM | POA: Insufficient documentation

## 2014-08-19 DIAGNOSIS — Z9889 Other specified postprocedural states: Secondary | ICD-10-CM

## 2014-08-19 DIAGNOSIS — M6289 Other specified disorders of muscle: Secondary | ICD-10-CM

## 2014-08-19 DIAGNOSIS — M629 Disorder of muscle, unspecified: Secondary | ICD-10-CM

## 2014-08-19 DIAGNOSIS — Z4789 Encounter for other orthopedic aftercare: Secondary | ICD-10-CM | POA: Diagnosis present

## 2014-08-19 DIAGNOSIS — I10 Essential (primary) hypertension: Secondary | ICD-10-CM | POA: Insufficient documentation

## 2014-08-19 DIAGNOSIS — M25612 Stiffness of left shoulder, not elsewhere classified: Secondary | ICD-10-CM | POA: Diagnosis not present

## 2014-08-19 DIAGNOSIS — R29898 Other symptoms and signs involving the musculoskeletal system: Secondary | ICD-10-CM

## 2014-08-19 NOTE — Therapy (Signed)
[place], [place], [address] Phone: [phone]   Fax:  [phone]  Occupational Therapy Evaluation  Patient Details  Name: Travis [name] MRN: [id number] Date of Birth: April 17, 1961 Referring Provider:  [name], MD  Encounter Date: 08/19/2014      OT End of Session - 08/19/14 1730    Visit Number 1   Number of Visits 16   Date for OT Re-Evaluation 10/14/14  mini reassessment: 09/16/14   Authorization Type UMR no visit limit   OT Start Time 1436   OT Stop Time 1515   OT Time Calculation (min) 39 min   Activity Tolerance Patient tolerated treatment well   Behavior During Therapy [facility] for tasks assessed/performed      Past Medical History  Diagnosis Date  . Night sweats     every once in a while  . Hyperlipidemia   . Diabetes mellitus   . Hernia, inguinal   . Dizziness   . Chronic headaches   . Arthritis   . Neuropathy of lower extremity   . Diabetic peripheral neuropathy 03/28/2013  . Diverticulosis   . Hypertension   . Complication of anesthesia     bleed after last shoulder-had to stay overnight  . Impingement syndrome of left shoulder 08/02/2014  . 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 Inguinal hernia repair Bilateral   . Cervical disc surgery    . Wrist surgery Right     fusion  . Shoulder arthroscopy Right     1  . Knee arthroscopy Right   . Colon surgery  2003    1/3 removed for diverticulitis  . Colonoscopy    . Umbilical hernia repair      with other hernia repair with mesh  . Shoulder arthroscopy Left 08/02/2014    Procedure: LEFT SHOULDER SCOPE DEBRIDEMENT/ACROMIOPLASTY;  Surgeon: [name], MD;  Location: [place];  Service: Orthopedics;  Laterality: Left;  ANESTHESIA: GENERAL, PRE/POST OP SCALENE    There were no vitals filed for this visit.  Visit Diagnosis:  S/P arthroscopy of shoulder  - Plan: Ot plan of care cert/re-cert  Pain in joint, shoulder region, left - Plan: Ot plan of care cert/re-cert  Decreased range of motion of shoulder, left - Plan: Ot plan of care cert/re-cert  Tight fascia - Plan: Ot plan of care cert/re-cert  Shoulder weakness - Plan: Ot plan of care cert/re-cert      Subjective Assessment - 08/19/14 1724    Subjective  S: I just want to be functional with my left arm and be able to return to work.    Pertinent History Patient is a 54 y/o male s/p left shoulder arthroscopy with extensive debridement, acromioplasty, manipulation on 08/02/14. Patient does not present to clinic in a sling and was told that he could move his arm as much as he can tolerate. Patient was told not to lift anything at this time. Dr. Mardelle Matte has referred patient to occupational therapy for evaluation and treatment.    Special Tests FOTO score: 39/100   Patient Stated Goals TO return to work and be pain free.    Currently in Pain? Yes   Pain Score 7    Pain Location Shoulder   Pain Orientation Left   Pain Descriptors / Indicators Aching   Pain Type Surgical pain   Pain Onset 1 to 4 weeks ago   Pain  Frequency Intermittent           [facility] OT Assessment - 08/19/14 1441    Assessment   Diagnosis Left shoulder arthroscopy   Onset Date --  2-3 years ago   Prior Therapy None on the left shoulder   Precautions   Precautions Shoulder   Type of Shoulder Precautions PROM for 4 weeks (09/16/14) progress to AAROM, AROM, and then strengthening   Pt did not present with any protocol from MD.    Restrictions   Weight Bearing Restrictions Yes   Balance Screen   Has the patient fallen in the past 6 months No   Home  Environment   Family/patient expects to be discharged to: Private residence   Living Arrangements Spouse/significant other   Prior Function   Level of Independence Independent with basic ADLs;Independent with gait   Vocation Full time employment   Vocation  Requirements Works at [employer]. 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    Mobility   Mobility Status Independent   Written Expression   Dominant Hand Right   Vision - History   Baseline Vision No visual deficits   Cognition   Overall Cognitive Status Within Functional Limits for tasks assessed   Observation/Other Assessments   Focus on Therapeutic Outcomes (FOTO)  FOTO score: 39/100 (61% impaired)   ROM / Strength   AROM / PROM / Strength AROM;PROM;Strength   Palpation   Palpation Mod fascial restrctions in left anterior deltoid and upper trapezius region.    AROM   Overall AROM Comments Assessed standing then supine. IR/ER adducted   AROM Assessment Site Shoulder   Right/Left Shoulder Left   Left Shoulder Flexion --  110/129   Left Shoulder ABduction --  86/95   Left Shoulder Internal Rotation --  90/90   Left Shoulder External Rotation --  70/85   PROM   Overall PROM Comments Assessed supine. IR/ER adducted   PROM Assessment Site Shoulder   Right/Left Shoulder Left   Left Shoulder Flexion 140 Degrees   Left Shoulder ABduction 110 Degrees   Left Shoulder Internal Rotation 90 Degrees   Left Shoulder External Rotation 90 Degrees   Strength   Overall Strength Unable to assess;Due to precautions;Due to pain   Strength Assessment Site Shoulder   Right/Left Shoulder Left                         OT Education - 08/19/14 1730    Education provided Yes   Education Details Table slides and shoulder stretches   Person(s) Educated Patient   Methods Explanation;Demonstration;Handout   Comprehension Verbalized understanding          OT Short Term Goals - 08/19/14 1737    OT SHORT TERM GOAL #1   Title Patient will be educated on a HEP.   Time 4   Period Weeks   Status New   OT SHORT TERM GOAL #2   Title  Patient will decrease LUE pain to a 4/10 or less during daily tasks.    Time 4   Period Weeks   Status New   OT SHORT TERM GOAL #3   Title Patient will increase PROM to [facility] to increase ability to complete dressing tasks.   Time 4   Period Weeks   Status  New   OT SHORT TERM GOAL #4   Title Patient will increase LUE strength to 3/5 to increase ability to reach overhead.   Time 4   Period Weeks   Status New   OT SHORT TERM GOAL #5   Title Patient will decrease fascial restrctions from mod to min amount.   Time 4   Period Weeks   Status New           OT Long Term Goals - 08/19/14 1739    OT LONG TERM GOAL #1   Title Patient will return to highest level of independence with all daily, leisure, and work tasks.    Time 8   Period Weeks   Status New   OT LONG TERM GOAL #2   Title Patient will decrease pain to 2/10 or less during daily tasks.    Time 8   Period Weeks   Status New   OT LONG TERM GOAL #3   Title Patient will increase AROM of LUE to WNL to increase ability to complete overhead tasks at work.    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    Time 8   Period Weeks   Status New   OT LONG TERM GOAL #5   Title Patient will decrease fascial restrictions to trace amount.    Time 8   Period Weeks   Status New               Plan - 08/19/14 1735    Clinical Impression Statement A: Patient is a 54 y/o male s/p left shoulder arthroscopy with extensive debridement, acromioplasty, manipulation causing increased pain, fascial restrictions and decreased strength and ROM.    Pt will benefit from skilled therapeutic intervention in order to improve on the following deficits (Retired) Decreased range of motion;Increased fascial restricitons;Pain;Impaired UE functional use;Decreased strength   Rehab Potential Excellent   OT Frequency 3x / week   OT  Duration 8 weeks   OT Treatment/Interventions Self-care/ADL training;Therapeutic exercise;Patient/family education;Manual Therapy;Neuromuscular education;Ultrasound;Therapeutic activities;Cryotherapy;Electrical Stimulation;Passive range of motion;Moist Heat   Plan P: patient will benefit from skilled OT services to increase functional performance during ADL tasks using LUE. Treatment Plan: myofascial release, muscle energy technique, PROM, AAROM, AROM, general strengthening.   OT Home Exercise Plan table slides and shoulder stretches - 08/19/14   Consulted and Agree with Plan of Care Patient        Problem List Patient Active Problem List   Diagnosis Date Noted  . Impingement syndrome of left shoulder 08/02/2014  . 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 Abdominal pain 06/13/2014  . Rectal bleeding 03/13/2014  . Diabetes type 2, controlled 02/18/2014  . Hyperlipidemia 02/18/2014  . Diabetic peripheral neuropathy 03/28/2013  . Irreducible incisional hernia 11/11/2010    Ailene Ravel, OTR/L,CBIS  [phone]  08/19/2014, 5:45 PM  [facility] [address] [place], [place], [address] Phone: [phone]   Fax:  [phone]

## 2014-08-19 NOTE — Patient Instructions (Signed)
TOWEL SLIDES COMPLETE FOR 1-3 MINUTES, 3-5 TIMES PER DAY  SHOULDER: Flexion On Table   Place hands on table, elbows straight. Move hips away from body. Press hands down into table. Hold ___ seconds. ___ reps per set, ___ sets per day, ___ days per week  Abduction (Passive)   With arm out to side, resting on table, lower head toward arm, keeping trunk away from table. Hold ____ seconds. Repeat ____ times. Do ____ sessions per day.  Copyright  VHI. All rights reserved.     Internal Rotation (Assistive)   Seated with elbow bent at right angle and held against side, slide arm on table surface in an inward arc. Repeat ____ times. Do ____ sessions per day. Activity: Use this motion to brush crumbs off the table.  Copyright  VHI. All rights reserved.    Flexibility: Corner Stretch   Standing in corner with hands just above shoulder level and feet _24___ inches from corner, lean forward until a comfortable stretch is felt across chest. Hold __10__ seconds. Repeat __10__ times per set. Do _1___ sets per session. Do __2-3__ sessions per day.  http://orth.exer.us/342   Copyright  VHI. All rights reserved.   Scapular Retraction (Standing)   With arms at sides, pinch shoulder blades together. Repeat _10___ times per set. Do __1__ sets per session. Do __2__ sessions per day.     Posterior Capsule Stretch   Stand or sit, one arm across body so hand rests over opposite shoulder. Gently push on crossed elbow with other hand until stretch is felt in shoulder of crossed arm  Repeat _10__ times per session. Do _2-3__ sessions per day.  Copyright  VHI. All rights reserved.   Flexors Stretch, Standing   Stand near wall and slide arm up, with palm facing away from wall, by leaning toward wall. Hold _10__ seconds.  Repeat 19___ times per session. Do _2__ sessions per day.  Copyright  VHI. All rights reserved.

## 2014-08-21 ENCOUNTER — Encounter (HOSPITAL_COMMUNITY): Payer: Self-pay | Admitting: Occupational Therapy

## 2014-08-21 ENCOUNTER — Ambulatory Visit (HOSPITAL_COMMUNITY): Payer: 59 | Admitting: Occupational Therapy

## 2014-08-21 DIAGNOSIS — Z4789 Encounter for other orthopedic aftercare: Secondary | ICD-10-CM | POA: Diagnosis not present

## 2014-08-21 DIAGNOSIS — M629 Disorder of muscle, unspecified: Secondary | ICD-10-CM

## 2014-08-21 DIAGNOSIS — M25612 Stiffness of left shoulder, not elsewhere classified: Secondary | ICD-10-CM

## 2014-08-21 DIAGNOSIS — M25512 Pain in left shoulder: Secondary | ICD-10-CM

## 2014-08-21 DIAGNOSIS — M6289 Other specified disorders of muscle: Secondary | ICD-10-CM

## 2014-08-21 NOTE — Therapy (Signed)
[place], [place], [address] Phone: [phone]   Fax:  [phone]  Occupational Therapy Treatment  Patient Details  Name: Travis [name] MRN: [id number] Date of Birth: 1960/07/22 Referring Provider:  [name], MD  Encounter Date: 08/21/2014      OT End of Session - 08/21/14 1604    Visit Number 2   Number of Visits 16   Date for OT Re-Evaluation 10/14/14  mini reassessment: 09/16/14   Authorization Type UMR no visit limit   OT Start Time 1515   OT Stop Time 1601   OT Time Calculation (min) 46 min   Activity Tolerance Patient tolerated treatment well   Behavior During Therapy [facility] for tasks assessed/performed      Past Medical History  Diagnosis Date  . Night sweats     every once in a while  . Hyperlipidemia   . Diabetes mellitus   . Hernia, inguinal   . Dizziness   . Chronic headaches   . Arthritis   . Neuropathy of lower extremity   . Diabetic peripheral neuropathy 03/28/2013  . Diverticulosis   . Hypertension   . Complication of anesthesia     bleed after last shoulder-had to stay overnight  . Impingement syndrome of left shoulder 08/02/2014  . 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 Inguinal hernia repair Bilateral   . Cervical disc surgery    . Wrist surgery Right     fusion  . Shoulder arthroscopy Right     1  . Knee arthroscopy Right   . Colon surgery  2003    1/3 removed for diverticulitis  . Colonoscopy    . Umbilical hernia repair      with other hernia repair with mesh  . Shoulder arthroscopy Left 08/02/2014    Procedure: LEFT SHOULDER SCOPE DEBRIDEMENT/ACROMIOPLASTY;  Surgeon: [name], MD;  Location: [facility];  Service: Orthopedics;  Laterality: Left;  ANESTHESIA: GENERAL, PRE/POST OP SCALENE    There were no vitals filed for this visit.  Visit Diagnosis:  Pain in joint, shoulder  region, left  Decreased range of motion of shoulder, left  Tight fascia      Subjective Assessment - 08/21/14 1510    Subjective  S: I've been outside doing yard work this morning.    Currently in Pain? Yes   Pain Score 7    Pain Location Shoulder   Pain Orientation Left   Pain Descriptors / Indicators Aching   Pain Type Acute pain            [facility] OT Assessment - 08/21/14 1509    Assessment   Diagnosis Left shoulder arthroscopy   Precautions   Precautions Shoulder   Type of Shoulder Precautions PROM for 4 weeks (09/16/14) progress to AAROM, AROM, and then strengthening                   OT Treatments/Exercises (OP) - 08/21/14 1517    Exercises   Exercises Shoulder   Shoulder Exercises: Supine   Protraction PROM;10 reps   Horizontal ABduction PROM;10 reps   External Rotation PROM;10 reps   Internal Rotation PROM;10 reps   Flexion PROM;10 reps   ABduction PROM;10 reps   Shoulder Exercises: Seated   Elevation AROM;10 reps   Extension AROM;10 reps   Row AROM;10 reps   Shoulder Exercises: Therapy Ball   Flexion 15 reps  ABduction 15 reps   Manual Therapy   Manual Therapy Myofascial release   Myofascial Release Myofascial release and manual stretching to left bicep, upper arm, trapezius and scapularis regions to decrease pain and increase joint range of motion.                  OT Short Term Goals - 08/21/14 1606    OT SHORT TERM GOAL #1   Title Patient will be educated on a HEP.   Time 4   Period Weeks   Status On-going   OT SHORT TERM GOAL #2   Title Patient will decrease LUE pain to a 4/10 or less during daily tasks.    Time 4   Period Weeks   Status On-going   OT SHORT TERM GOAL #3   Title Patient will increase PROM to [facility] to increase ability to complete dressing tasks.   Time 4   Period Weeks   Status On-going   OT SHORT TERM GOAL #4   Title Patient will increase LUE strength to 3/5 to increase ability to reach overhead.    Time 4   Period Weeks   Status On-going   OT SHORT TERM GOAL #5   Title Patient will decrease fascial restrctions from mod to min amount.   Time 4   Period Weeks   Status On-going           OT Long Term Goals - 08/21/14 1607    OT LONG TERM GOAL #1   Title Patient will return to highest level of independence with all daily, leisure, and work tasks.    Time 8   Period Weeks   Status On-going   OT LONG TERM GOAL #2   Title Patient will decrease pain to 2/10 or less during daily tasks.    Time 8   Period Weeks   Status On-going   OT LONG TERM GOAL #3   Title Patient will increase AROM of LUE to WNL to increase ability to complete overhead tasks at work.    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    Time 8   Period Weeks   Status On-going   OT LONG TERM GOAL #5   Title Patient will decrease fascial restrictions to trace amount.    Time 8   Period Weeks   Status On-going               Plan - 08/21/14 1604    Clinical Impression Statement A: Initiated myofascial release, manual stretching, P/AROM exercises this date. Pt reports he is completing his HEP, and is using his arm as much as he can tolerate during daily tasks. Pt tolerated treatment well. Pt provided with copy of evaluation & therapy goals.    Plan P: Continue MFR, manual stretching, focusing on upper arm and trapezius regions.         Problem List Patient Active Problem List   Diagnosis Date Noted  . Impingement syndrome of left shoulder 08/02/2014  . 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 Abdominal pain 06/13/2014  . Rectal bleeding 03/13/2014  . Diabetes type 2, controlled 02/18/2014  . Hyperlipidemia 02/18/2014  . Diabetic peripheral neuropathy 03/28/2013  . Irreducible incisional hernia 11/11/2010    Guadelupe Sabin, OTR/L  [phone]  08/21/2014,  4:08 PM  [place], [place], [address] Phone: [phone]   Fax:  [phone]

## 2014-08-23 ENCOUNTER — Ambulatory Visit (HOSPITAL_COMMUNITY): Payer: 59 | Admitting: Specialist

## 2014-08-23 DIAGNOSIS — Z4789 Encounter for other orthopedic aftercare: Secondary | ICD-10-CM | POA: Diagnosis not present

## 2014-08-23 DIAGNOSIS — M6289 Other specified disorders of muscle: Secondary | ICD-10-CM

## 2014-08-23 DIAGNOSIS — M25612 Stiffness of left shoulder, not elsewhere classified: Secondary | ICD-10-CM

## 2014-08-23 DIAGNOSIS — M25512 Pain in left shoulder: Secondary | ICD-10-CM

## 2014-08-23 DIAGNOSIS — M629 Disorder of muscle, unspecified: Secondary | ICD-10-CM

## 2014-08-23 NOTE — Therapy (Signed)
[place], [place], [address] Phone: [phone]   Fax:  [phone]  Occupational Therapy Treatment  Patient Details  Name: Travis [name] MRN: [id number] Date of Birth: 12/15/1960 Referring Provider:  [name], MD  Encounter Date: 08/23/2014      OT End of Session - 08/23/14 1426    Visit Number 3   Number of Visits 16   Date for OT Re-Evaluation 10/14/14  09/16/14   Authorization Type UMR no visit limit   OT Start Time 1128   OT Stop Time 1212   OT Time Calculation (min) 44 min   Activity Tolerance Patient tolerated treatment well   Behavior During Therapy [facility] for tasks assessed/performed      Past Medical History  Diagnosis Date  . Night sweats     every once in a while  . Hyperlipidemia   . Diabetes mellitus   . Hernia, inguinal   . Dizziness   . Chronic headaches   . Arthritis   . Neuropathy of lower extremity   . Diabetic peripheral neuropathy 03/28/2013  . Diverticulosis   . Hypertension   . Complication of anesthesia     bleed after last shoulder-had to stay overnight  . Impingement syndrome of left shoulder 08/02/2014  . 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 Inguinal hernia repair Bilateral   . Cervical disc surgery    . Wrist surgery Right     fusion  . Shoulder arthroscopy Right     1  . Knee arthroscopy Right   . Colon surgery  2003    1/3 removed for diverticulitis  . Colonoscopy    . Umbilical hernia repair      with other hernia repair with mesh  . Shoulder arthroscopy Left 08/02/2014    Procedure: LEFT SHOULDER SCOPE DEBRIDEMENT/ACROMIOPLASTY;  Surgeon: [name], MD;  Location: [place];  Service: Orthopedics;  Laterality: Left;  ANESTHESIA: GENERAL, PRE/POST OP SCALENE    There were no vitals filed for this visit.  Visit Diagnosis:  Pain in joint, shoulder region,  left  Decreased range of motion of shoulder, left  Tight fascia      Subjective Assessment - 08/23/14 1421    Subjective  S:  I was really sore after the last session.    Currently in Pain? Yes   Pain Score 7    Pain Location Shoulder   Pain Orientation Left   Pain Descriptors / Indicators Aching   Pain Type Acute pain            [facility] OT Assessment - 08/23/14 0001    Assessment   Diagnosis Left shoulder arthroscopy   Precautions   Precautions Shoulder   Type of Shoulder Precautions PROM through 4/15 then progressing as tolerated                  OT Treatments/Exercises (OP) - 08/23/14 0001    Exercises   Exercises Shoulder   Shoulder Exercises: Supine   Protraction PROM;AAROM;10 reps   Horizontal ABduction PROM;AAROM;10 reps   External Rotation PROM;AAROM;10 reps   Internal Rotation PROM;AAROM;10 reps   Flexion PROM;AAROM;10 reps   ABduction PROM;AAROM;10 reps   Shoulder Exercises: Seated   Elevation AROM;10 reps   Extension AROM;10 reps   Row AROM;10 reps   Other Seated Exercises thoracic flexion extension 10 times   Shoulder Exercises: Therapy Diona Foley  Flexion 15 reps   ABduction 15 reps   Shoulder Exercises: Isometric Strengthening   Flexion Supine;3X5"   Extension Supine;3X5"   External Rotation Supine;3X5"   Internal Rotation Supine;3X5"   ABduction Supine;3X5"   ADduction Supine;3X5"   Manual Therapy   Manual Therapy Myofascial release   Myofascial Release Myofascial release and manual stretching to left bicep, upper arm, trapezius and scapularis regions to decrease pain and increase joint range of motion.                OT Education - 08/23/14 1425    Education provided Yes   Education Details Patient is returning to work Monday, recommended deskwork only and no lifting or changing tires   Person(s) Educated Patient   Methods Explanation   Comprehension Verbalized understanding          OT Short Term Goals - 08/21/14  1606    OT SHORT TERM GOAL #1   Title Patient will be educated on a HEP.   Time 4   Period Weeks   Status On-going   OT SHORT TERM GOAL #2   Title Patient will decrease LUE pain to a 4/10 or less during daily tasks.    Time 4   Period Weeks   Status On-going   OT SHORT TERM GOAL #3   Title Patient will increase PROM to [facility] to increase ability to complete dressing tasks.   Time 4   Period Weeks   Status On-going   OT SHORT TERM GOAL #4   Title Patient will increase LUE strength to 3/5 to increase ability to reach overhead.   Time 4   Period Weeks   Status On-going   OT SHORT TERM GOAL #5   Title Patient will decrease fascial restrctions from mod to min amount.   Time 4   Period Weeks   Status On-going           OT Long Term Goals - 08/21/14 1607    OT LONG TERM GOAL #1   Title Patient will return to highest level of independence with all daily, leisure, and work tasks.    Time 8   Period Weeks   Status On-going   OT LONG TERM GOAL #2   Title Patient will decrease pain to 2/10 or less during daily tasks.    Time 8   Period Weeks   Status On-going   OT LONG TERM GOAL #3   Title Patient will increase AROM of LUE to WNL to increase ability to complete overhead tasks at work.    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    Time 8   Period Weeks   Status On-going   OT LONG TERM GOAL #5   Title Patient will decrease fascial restrictions to trace amount.    Time 8   Period Weeks   Status On-going               Plan - 08/23/14 1426    Clinical Impression Statement A:  Patient with near full PROM in supine, therefore began AAROM in supine.  Patient requires vg and tactile cues to depress shoulder blade during exercises.   Plan P:  Add AAROM in seated, issue AAROM to HEP. Decrease amount of cuing needed to depress shoulder blade with  exercises.  Problem List Patient Active Problem List   Diagnosis Date Noted  . Impingement syndrome of left shoulder 08/02/2014  . 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 Abdominal pain 06/13/2014  . Rectal bleeding 03/13/2014  . Diabetes type 2, controlled 02/18/2014  . Hyperlipidemia 02/18/2014  . Diabetic peripheral neuropathy 03/28/2013  . Irreducible incisional hernia 11/11/2010    Vangie Bicker, OTR/L [phone]  08/23/2014, 2:29 PM  [place] [address] [place], [place], [address] Phone: [phone]   Fax:  [phone]

## 2014-08-26 ENCOUNTER — Encounter (HOSPITAL_COMMUNITY): Payer: 59

## 2014-08-27 ENCOUNTER — Encounter (HOSPITAL_COMMUNITY): Payer: 59

## 2014-08-27 ENCOUNTER — Encounter (HOSPITAL_COMMUNITY): Payer: Self-pay

## 2014-08-27 ENCOUNTER — Ambulatory Visit (HOSPITAL_COMMUNITY): Payer: 59

## 2014-08-27 DIAGNOSIS — M629 Disorder of muscle, unspecified: Secondary | ICD-10-CM

## 2014-08-27 DIAGNOSIS — Z4789 Encounter for other orthopedic aftercare: Secondary | ICD-10-CM | POA: Diagnosis not present

## 2014-08-27 DIAGNOSIS — M6289 Other specified disorders of muscle: Secondary | ICD-10-CM

## 2014-08-27 DIAGNOSIS — M25512 Pain in left shoulder: Secondary | ICD-10-CM

## 2014-08-27 DIAGNOSIS — M25612 Stiffness of left shoulder, not elsewhere classified: Secondary | ICD-10-CM

## 2014-08-27 NOTE — Patient Instructions (Signed)
ROM: Flexion - Wand   Bring wand directly over head, leading with right side. Reach back until stretch is felt. Hold ____ seconds. Repeat ____ times per set. Do _12___ sets per session. Do __2__ sessions per day.  http://orth.exer.us/744   Copyright  VHI. All rights reserved.   ROM: Abduction - Wand   Holding wand with left hand palm up, push wand directly out to side, leading with other hand palm down, until stretch is felt. Hold ____ seconds. Repeat ____ times per set. Do _12___ sets per session. Do __2__ sessions per day.  http://orth.exer.us/746   Copyright  VHI. All rights reserved.   ROM: Horizontal Abduction / Adduction - Wand   Keeping both palms down, push right hand across body with other hand. Then pull back across body, keeping arms parallel to floor. Do not allow trunk to twist. Hold ____ seconds. Repeat ____ times per set. Do _12___ sets per session. Do _2___ sessions per day.  http://orth.exer.us/752   Copyright  VHI. All rights reserved.   ROM: External / Internal Rotation - Wand   Holding wand with left hand palm up, push out from body with other hand, palm down. Keep both elbows bent. When stretch is felt, hold ____ seconds. Repeat to other side, leading with same hand. Keep elbows bent. Repeat ____ times per set. Do _12___ sets per session. Do __2__ sessions per day.  http://orth.exer.us/748   Copyright  VHI. All rights reserved.

## 2014-08-27 NOTE — Therapy (Signed)
[place], [place], [address] Phone: [phone]   Fax:  [phone]  Occupational Therapy Treatment  Patient Details  Name: Travis [name] MRN: [id number] Date of Birth: [birth year] Referring Provider:  Kathyrn Drown, MD  Encounter Date: 08/27/2014      OT End of Session - 08/27/14 1750    Visit Number 4   Number of Visits 16   Date for OT Re-Evaluation 10/14/14  09/16/14   Authorization Type UMR no visit limit   OT Start Time 1645   OT Stop Time 1742   OT Time Calculation (min) 57 min   Activity Tolerance Patient tolerated treatment well   Behavior During Therapy [facility] for tasks assessed/performed      Past Medical History  Diagnosis Date  . Night sweats     every once in a while  . Hyperlipidemia   . Diabetes mellitus   . Hernia, inguinal   . Dizziness   . Chronic headaches   . Arthritis   . Neuropathy of lower extremity   . Diabetic peripheral neuropathy 03/28/2013  . Diverticulosis   . Hypertension   . Complication of anesthesia     bleed after last shoulder-had to stay overnight  . Impingement syndrome of left shoulder 08/02/2014  . 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 Inguinal hernia repair Bilateral   . Cervical disc surgery    . Wrist surgery Right     fusion  . Shoulder arthroscopy Right     1  . Knee arthroscopy Right   . Colon surgery  2003    1/3 removed for diverticulitis  . Colonoscopy    . Umbilical hernia repair      with other hernia repair with mesh  . Shoulder arthroscopy Left 08/02/2014    Procedure: LEFT SHOULDER SCOPE DEBRIDEMENT/ACROMIOPLASTY;  Surgeon: [name], MD;  Location: [place];  Service: Orthopedics;  Laterality: Left;  ANESTHESIA: GENERAL, PRE/POST OP SCALENE    There were no vitals filed for this visit.  Visit Diagnosis:  Pain in joint, shoulder region,  left  Decreased range of motion of shoulder, left  Tight fascia      Subjective Assessment - 08/27/14 1653    Subjective  S: It feels like it's still asleep on the out side of my shoulder."   Currently in Pain? Yes   Pain Score 7    Pain Location Shoulder   Pain Orientation Left   Pain Descriptors / Indicators Aching   Pain Type Acute pain            [facility] OT Assessment - 08/27/14 1656    Assessment   Diagnosis Left shoulder arthroscopy   Precautions   Precautions Shoulder   Type of Shoulder Precautions PROM through 4/15 then progressing as tolerated                  OT Treatments/Exercises (OP) - 08/27/14 1654    Exercises   Exercises Shoulder   Shoulder Exercises: Supine   Protraction PROM;5 reps;AAROM;10 reps   Horizontal ABduction PROM;5 reps;AAROM;10 reps   External Rotation PROM;5 reps;AAROM;10 reps   Internal Rotation PROM;5 reps;AAROM;10 reps   Flexion PROM;5 reps;AAROM;10 reps   ABduction PROM;5 reps;AAROM;10 reps   Shoulder Exercises: Seated   Elevation AROM;12 reps   Extension AROM;12 reps   Row AROM;12 reps   Shoulder Exercises: Therapy Diona Foley  Flexion 15 reps   ABduction 15 reps   Manual Therapy   Manual Therapy Myofascial release   Myofascial Release Myofascial release and manual stretching to left bicep, upper arm, trapezius and scapularis regions to decrease pain and increase joint range of motion.                OT Education - 08/27/14 1735    Education provided Yes   Education Details Added seated/supine shoulder AAROM exercises to HEP.   Person(s) Educated Patient   Methods Explanation;Demonstration;Handout   Comprehension Verbalized understanding;Returned demonstration          OT Short Term Goals - 08/21/14 1606    OT SHORT TERM GOAL #1   Title Patient will be educated on a HEP.   Time 4   Period Weeks   Status On-going   OT SHORT TERM GOAL #2   Title Patient will decrease LUE pain to a 4/10 or less during  daily tasks.    Time 4   Period Weeks   Status On-going   OT SHORT TERM GOAL #3   Title Patient will increase PROM to [facility] to increase ability to complete dressing tasks.   Time 4   Period Weeks   Status On-going   OT SHORT TERM GOAL #4   Title Patient will increase LUE strength to 3/5 to increase ability to reach overhead.   Time 4   Period Weeks   Status On-going   OT SHORT TERM GOAL #5   Title Patient will decrease fascial restrctions from mod to min amount.   Time 4   Period Weeks   Status On-going           OT Long Term Goals - 08/21/14 1607    OT LONG TERM GOAL #1   Title Patient will return to highest level of independence with all daily, leisure, and work tasks.    Time 8   Period Weeks   Status On-going   OT LONG TERM GOAL #2   Title Patient will decrease pain to 2/10 or less during daily tasks.    Time 8   Period Weeks   Status On-going   OT LONG TERM GOAL #3   Title Patient will increase AROM of LUE to WNL to increase ability to complete overhead tasks at work.    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    Time 8   Period Weeks   Status On-going   OT LONG TERM GOAL #5   Title Patient will decrease fascial restrictions to trace amount.    Time 8   Period Weeks   Status On-going               Plan - 08/27/14 1736    Clinical Impression Statement A:  Pt received less a couple verbal cues to depress shoulder.  Pt demonstrated good form during supine AAROM exercises.  Pt was given demonstration of seated AAROM and returned demonstration when reviewing additional HEP exercises, but unable to add the exercises to todays session due to time constraint.  Pt tolerated exercises well.   Plan P:  Complete AAROM exercises in seated/standing.  Add Pro/ret/elev/dep to exercises.  Follow-up on additional HEP exercises.         Problem List Patient Active Problem List   Diagnosis  Date Noted  . Impingement syndrome of left shoulder 08/02/2014  . 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 Abdominal pain 06/13/2014  . Rectal bleeding 03/13/2014  . Diabetes type 2, controlled 02/18/2014  . Hyperlipidemia 02/18/2014  . Diabetic peripheral neuropathy 03/28/2013  . Irreducible incisional hernia 11/11/2010    Elba Barman, OTA Student [phone]  08/27/2014, 5:51 PM  [facility] [address] [place], [place], [address] Phone: [phone]   Fax:  [phone]

## 2014-08-29 ENCOUNTER — Ambulatory Visit (HOSPITAL_COMMUNITY): Payer: 59

## 2014-08-29 ENCOUNTER — Encounter (HOSPITAL_COMMUNITY): Payer: Self-pay

## 2014-08-29 DIAGNOSIS — M629 Disorder of muscle, unspecified: Secondary | ICD-10-CM

## 2014-08-29 DIAGNOSIS — M6289 Other specified disorders of muscle: Secondary | ICD-10-CM

## 2014-08-29 DIAGNOSIS — Z4789 Encounter for other orthopedic aftercare: Secondary | ICD-10-CM | POA: Diagnosis not present

## 2014-08-29 DIAGNOSIS — M25612 Stiffness of left shoulder, not elsewhere classified: Secondary | ICD-10-CM

## 2014-08-29 DIAGNOSIS — M25512 Pain in left shoulder: Secondary | ICD-10-CM

## 2014-08-29 NOTE — Therapy (Signed)
[place], [place], [address] Phone: [phone]   Fax:  [phone]  Occupational Therapy Treatment  Patient Details  Name: Travis [name] MRN: [id number] Date of Birth: 11-21-1960 Referring Provider:  Kathyrn Drown, MD  Encounter Date: 08/29/2014      OT End of Session - 08/29/14 1740    Visit Number 5   Number of Visits 16   Date for OT Re-Evaluation 10/14/14  09/16/14   Authorization Type UMR no visit limit   OT Start Time 1655   OT Stop Time 1743   OT Time Calculation (min) 48 min   Activity Tolerance Patient tolerated treatment well   Behavior During Therapy [name] for tasks assessed/performed      Past Medical History  Diagnosis Date  . Night sweats     every once in a while  . Hyperlipidemia   . Diabetes mellitus   . Hernia, inguinal   . Dizziness   . Chronic headaches   . Arthritis   . Neuropathy of lower extremity   . Diabetic peripheral neuropathy 03/28/2013  . Diverticulosis   . Hypertension   . Complication of anesthesia     bleed after last shoulder-had to stay overnight  . Impingement syndrome of left shoulder 08/02/2014  . 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 Inguinal hernia repair Bilateral   . Cervical disc surgery    . Wrist surgery Right     fusion  . Shoulder arthroscopy Right     1  . Knee arthroscopy Right   . Colon surgery  2003    1/3 removed for diverticulitis  . Colonoscopy    . Umbilical hernia repair      with other hernia repair with mesh  . Shoulder arthroscopy Left 08/02/2014    Procedure: LEFT SHOULDER SCOPE DEBRIDEMENT/ACROMIOPLASTY;  Surgeon: [name], MD;  Location: [place];  Service: Orthopedics;  Laterality: Left;  ANESTHESIA: GENERAL, PRE/POST OP SCALENE    There were no vitals filed for this visit.  Visit Diagnosis:  Pain in joint, shoulder region,  left  Decreased range of motion of shoulder, left  Tight fascia      Subjective Assessment - 08/29/14 1657    Currently in Pain? Yes   Pain Score 7    Pain Location Shoulder   Pain Orientation Left   Pain Descriptors / Indicators Sore            [facility] OT Assessment - 08/29/14 1658    Assessment   Diagnosis Left shoulder arthroscopy   Precautions   Precautions Shoulder   Type of Shoulder Precautions PROM through 4/15 then progressing as tolerated                  OT Treatments/Exercises (OP) - 08/29/14 1658    Exercises   Exercises Shoulder   Shoulder Exercises: Supine   Protraction PROM;5 reps;AAROM;10 reps   Horizontal ABduction PROM;5 reps;AAROM;10 reps   External Rotation PROM;5 reps;AAROM;10 reps   Internal Rotation PROM;5 reps;AAROM;10 reps   Flexion PROM;5 reps;AAROM;10 reps   ABduction PROM;5 reps;AAROM;10 reps   Shoulder Exercises: Seated   Elevation AROM;15 reps   Extension AROM;15 reps   Row AROM;15 reps   Protraction AAROM;12 reps   Horizontal ABduction AAROM;12 reps   External Rotation AAROM;12 reps   Internal Rotation AAROM;12 reps   Flexion AAROM;12 reps   Abduction AAROM;12  reps   Shoulder Exercises: ROM/Strengthening   Proximal Shoulder Strengthening, Supine 10X   Manual Therapy   Manual Therapy Myofascial release   Myofascial Release Myofascial release and manual stretching to left bicep, upper arm, trapezius and scapularis regions to decrease pain and increase joint range of motion.                  OT Short Term Goals - 08/21/14 1606    OT SHORT TERM GOAL #1   Title Patient will be educated on a HEP.   Time 4   Period Weeks   Status On-going   OT SHORT TERM GOAL #2   Title Patient will decrease LUE pain to a 4/10 or less during daily tasks.    Time 4   Period Weeks   Status On-going   OT SHORT TERM GOAL #3   Title Patient will increase PROM to [facility] to increase ability to complete dressing tasks.   Time 4    Period Weeks   Status On-going   OT SHORT TERM GOAL #4   Title Patient will increase LUE strength to 3/5 to increase ability to reach overhead.   Time 4   Period Weeks   Status On-going   OT SHORT TERM GOAL #5   Title Patient will decrease fascial restrctions from mod to min amount.   Time 4   Period Weeks   Status On-going           OT Long Term Goals - 08/21/14 1607    OT LONG TERM GOAL #1   Title Patient will return to highest level of independence with all daily, leisure, and work tasks.    Time 8   Period Weeks   Status On-going   OT LONG TERM GOAL #2   Title Patient will decrease pain to 2/10 or less during daily tasks.    Time 8   Period Weeks   Status On-going   OT LONG TERM GOAL #3   Title Patient will increase AROM of LUE to WNL to increase ability to complete overhead tasks at work.    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    Time 8   Period Weeks   Status On-going   OT LONG TERM GOAL #5   Title Patient will decrease fascial restrictions to trace amount.    Time 8   Period Weeks   Status On-going               Plan - 08/29/14 1741    Clinical Impression Statement A:  Added Proximal Shoulder Strengthening in supine at 10X.  Increased AAROM seated reps to 12.  Unable to perform AROM scapular due to time constraint.  Pt tolerated exercises well.   Plan P:  Increase AAROM supine and seated reps to 15.  Add Proximal Shoulder Strengthening in standing/seated.  Add pro/ret/elev.dep.  Attempt theraband scapular exercises if able to tolerate.        Problem List Patient Active Problem List   Diagnosis Date Noted  . Impingement syndrome of left shoulder 08/02/2014  . 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 Abdominal pain 06/13/2014  . Rectal bleeding 03/13/2014  . Diabetes type 2, controlled 02/18/2014  .  Hyperlipidemia 02/18/2014  . Diabetic peripheral neuropathy 03/28/2013  . Irreducible incisional hernia 11/11/2010  Elba Barman, OTA Student [phone]  08/29/2014, 5:49 PM  [facility] [address] [place], [place], [address] Phone: [phone]   Fax:  [phone]

## 2014-09-03 ENCOUNTER — Ambulatory Visit (HOSPITAL_COMMUNITY): Payer: 59 | Attending: Orthopedic Surgery

## 2014-09-03 ENCOUNTER — Encounter (HOSPITAL_COMMUNITY): Payer: Self-pay

## 2014-09-03 DIAGNOSIS — I10 Essential (primary) hypertension: Secondary | ICD-10-CM | POA: Diagnosis not present

## 2014-09-03 DIAGNOSIS — Z9889 Other specified postprocedural states: Secondary | ICD-10-CM | POA: Insufficient documentation

## 2014-09-03 DIAGNOSIS — M6289 Other specified disorders of muscle: Secondary | ICD-10-CM

## 2014-09-03 DIAGNOSIS — M25512 Pain in left shoulder: Secondary | ICD-10-CM | POA: Diagnosis not present

## 2014-09-03 DIAGNOSIS — E119 Type 2 diabetes mellitus without complications: Secondary | ICD-10-CM | POA: Insufficient documentation

## 2014-09-03 DIAGNOSIS — M25612 Stiffness of left shoulder, not elsewhere classified: Secondary | ICD-10-CM | POA: Diagnosis not present

## 2014-09-03 DIAGNOSIS — M629 Disorder of muscle, unspecified: Secondary | ICD-10-CM

## 2014-09-03 DIAGNOSIS — Z4789 Encounter for other orthopedic aftercare: Secondary | ICD-10-CM | POA: Insufficient documentation

## 2014-09-03 NOTE — Therapy (Signed)
[place], [place], [address] Phone: [phone]   Fax:  [phone]  Occupational Therapy Treatment  Patient Details  Name: Travis [name] MRN: [id number] Date of Birth: 06-Jul-1960 Referring Provider:  Kathyrn Drown, MD  Encounter Date: 09/03/2014    Past Medical History  Diagnosis Date  . Night sweats     every once in a while  . Hyperlipidemia   . Diabetes mellitus   . Hernia, inguinal   . Dizziness   . Chronic headaches   . Arthritis   . Neuropathy of lower extremity   . Diabetic peripheral neuropathy 03/28/2013  . Diverticulosis   . Hypertension   . Complication of anesthesia     bleed after last shoulder-had to stay overnight  . Impingement syndrome of left shoulder 08/02/2014  . 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 Inguinal hernia repair Bilateral   . Cervical disc surgery    . Wrist surgery Right     fusion  . Shoulder arthroscopy Right     1  . Knee arthroscopy Right   . Colon surgery  2003    1/3 removed for diverticulitis  . Colonoscopy    . Umbilical hernia repair      with other hernia repair with mesh  . Shoulder arthroscopy Left 08/02/2014    Procedure: LEFT SHOULDER SCOPE DEBRIDEMENT/ACROMIOPLASTY;  Surgeon: [name], MD;  Location: [facility];  Service: Orthopedics;  Laterality: Left;  ANESTHESIA: GENERAL, PRE/POST OP SCALENE    There were no vitals filed for this visit.  Visit Diagnosis:  Pain in joint, shoulder region, left  Decreased range of motion of shoulder, left  Tight fascia      Subjective Assessment - 09/03/14 1650    Subjective  S: Pt stated "I got out my golf club and practiced with it this weekend."   Currently in Pain? Yes   Pain Score 7    Pain Location Shoulder   Pain Orientation Left   Pain Descriptors / Indicators Sore            [facility] OT Assessment - 09/03/14  1649    Assessment   Diagnosis Left shoulder arthroscopy   Precautions   Precautions Shoulder   Type of Shoulder Precautions PROM through 4/15 then progressing as tolerated                  OT Treatments/Exercises (OP) - 09/03/14 1619    Exercises   Exercises Shoulder   Shoulder Exercises: Supine   Protraction PROM;5 reps;AAROM;15 reps   Horizontal ABduction PROM;5 reps;AAROM;15 reps   External Rotation PROM;5 reps;AAROM;15 reps   Internal Rotation PROM;5 reps;AAROM;15 reps   Flexion PROM;5 reps;AAROM;15 reps   ABduction PROM;5 reps;AAROM;15 reps   Shoulder Exercises: Standing   Protraction AAROM;15 reps   Horizontal ABduction AAROM;15 reps   External Rotation AAROM;15 reps   Internal Rotation AAROM;15 reps   Flexion AAROM;15 reps   ABduction AAROM;15 reps   Shoulder Exercises: ROM/Strengthening   Proximal Shoulder Strengthening, Supine 15X   Proximal Shoulder Strengthening, Seated 15X   Prot/Ret//Elev/Dep 1'   Manual Therapy   Manual Therapy Myofascial release   Myofascial Release Myofascial release and manual stretching to left bicep, upper arm, trapezius and scapularis regions to decrease pain and increase joint range of motion.  OT Short Term Goals - 08/21/14 1606    OT SHORT TERM GOAL #1   Title Patient will be educated on a HEP.   Time 4   Period Weeks   Status On-going   OT SHORT TERM GOAL #2   Title Patient will decrease LUE pain to a 4/10 or less during daily tasks.    Time 4   Period Weeks   Status On-going   OT SHORT TERM GOAL #3   Title Patient will increase PROM to [facility] to increase ability to complete dressing tasks.   Time 4   Period Weeks   Status On-going   OT SHORT TERM GOAL #4   Title Patient will increase LUE strength to 3/5 to increase ability to reach overhead.   Time 4   Period Weeks   Status On-going   OT SHORT TERM GOAL #5   Title Patient will decrease fascial restrctions from mod to min amount.    Time 4   Period Weeks   Status On-going           OT Long Term Goals - 08/21/14 1607    OT LONG TERM GOAL #1   Title Patient will return to highest level of independence with all daily, leisure, and work tasks.    Time 8   Period Weeks   Status On-going   OT LONG TERM GOAL #2   Title Patient will decrease pain to 2/10 or less during daily tasks.    Time 8   Period Weeks   Status On-going   OT LONG TERM GOAL #3   Title Patient will increase AROM of LUE to WNL to increase ability to complete overhead tasks at work.    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    Time 8   Period Weeks   Status On-going   OT LONG TERM GOAL #5   Title Patient will decrease fascial restrictions to trace amount.    Time 8   Period Weeks   Status On-going               Plan - 09/03/14 1651    Clinical Impression Statement A:  Increased AAROM reps for supine and standing to 15.  Increased Proximal Shoulder Strengthening reps in supine and standing to 15.  Added Pro/Ret/Elev/Dep to exercises.  Pt tolerated exercises well.   Plan P:  Add theraband scapular exercises if tolerable.  Continue increased ROM during exercises.  Add therapy ball Left/Right to exercises if able to tolerate.        Problem List Patient Active Problem List   Diagnosis Date Noted  . Impingement syndrome of left shoulder 08/02/2014  . 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 Abdominal pain 06/13/2014  . Rectal bleeding 03/13/2014  . Diabetes type 2, controlled 02/18/2014  . Hyperlipidemia 02/18/2014  . Diabetic peripheral neuropathy 03/28/2013  . Irreducible incisional hernia 11/11/2010    Elba Barman, OTA Student [phone]  09/03/2014, 4:55 PM  [facility] [address] [place], [place], [address] Phone: [phone]    Fax:  [phone]

## 2014-09-05 ENCOUNTER — Encounter (HOSPITAL_COMMUNITY): Payer: Self-pay

## 2014-09-05 ENCOUNTER — Ambulatory Visit (HOSPITAL_COMMUNITY): Payer: 59

## 2014-09-05 DIAGNOSIS — Z4789 Encounter for other orthopedic aftercare: Secondary | ICD-10-CM | POA: Diagnosis not present

## 2014-09-05 DIAGNOSIS — M629 Disorder of muscle, unspecified: Secondary | ICD-10-CM

## 2014-09-05 DIAGNOSIS — M25512 Pain in left shoulder: Secondary | ICD-10-CM

## 2014-09-05 DIAGNOSIS — M25612 Stiffness of left shoulder, not elsewhere classified: Secondary | ICD-10-CM

## 2014-09-05 DIAGNOSIS — M6289 Other specified disorders of muscle: Secondary | ICD-10-CM

## 2014-09-05 NOTE — Therapy (Signed)
[place], [place], [address] Phone: [phone]   Fax:  [phone]  Occupational Therapy Treatment  Patient Details  Name: Travis [name] MRN: [id number] Date of Birth: 04-20-1961 Referring Provider:  Kathyrn Drown, MD  Encounter Date: 09/05/2014      OT End of Session - 09/05/14 1646    Visit Number 6   Number of Visits 16   Date for OT Re-Evaluation 10/14/14  09/16/14   Authorization Type UMR no visit limit   OT Start Time 1605   OT Stop Time 1650   OT Time Calculation (min) 45 min   Activity Tolerance Patient tolerated treatment well   Behavior During Therapy [facility] for tasks assessed/performed      Past Medical History  Diagnosis Date  . Night sweats     every once in a while  . Hyperlipidemia   . Diabetes mellitus   . Hernia, inguinal   . Dizziness   . Chronic headaches   . Arthritis   . Neuropathy of lower extremity   . Diabetic peripheral neuropathy 03/28/2013  . Diverticulosis   . Hypertension   . Complication of anesthesia     bleed after last shoulder-had to stay overnight  . Impingement syndrome of left shoulder 08/02/2014  . 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 Inguinal hernia repair Bilateral   . Cervical disc surgery    . Wrist surgery Right     fusion  . Shoulder arthroscopy Right     1  . Knee arthroscopy Right   . Colon surgery  2003    1/3 removed for diverticulitis  . Colonoscopy    . Umbilical hernia repair      with other hernia repair with mesh  . Shoulder arthroscopy Left 08/02/2014    Procedure: LEFT SHOULDER SCOPE DEBRIDEMENT/ACROMIOPLASTY;  Surgeon: [name], MD;  Location: [facility];  Service: Orthopedics;  Laterality: Left;  ANESTHESIA: GENERAL, PRE/POST OP SCALENE    There were no vitals filed for this visit.  Visit Diagnosis:  Pain in joint, shoulder region,  left  Decreased range of motion of shoulder, left  Tight fascia      Subjective Assessment - 09/05/14 1609    Subjective  S:  "It's just really sore today."   Currently in Pain? Yes   Pain Score 6    Pain Location Shoulder   Pain Orientation Left   Pain Descriptors / Indicators Sore            [facility] OT Assessment - 09/05/14 1610    Assessment   Diagnosis Left shoulder arthroscopy   Precautions   Precautions Shoulder   Type of Shoulder Precautions PROM through 4/15 then progressing as tolerated                  OT Treatments/Exercises (OP) - 09/05/14 1610    Exercises   Exercises Shoulder   Shoulder Exercises: Supine   Protraction PROM;5 reps;AAROM;15 reps   Horizontal ABduction PROM;5 reps;AAROM;15 reps   External Rotation PROM;5 reps;AAROM;15 reps   Internal Rotation PROM;5 reps;AAROM;15 reps   Flexion PROM;5 reps;AAROM;15 reps   ABduction PROM;5 reps;AAROM;15 reps   Shoulder Exercises: Standing   Protraction AAROM;15 reps   Horizontal ABduction AAROM;15 reps   External Rotation AAROM;15 reps   Internal Rotation AAROM;15 reps   Flexion AAROM;15 reps   ABduction AAROM;15 reps   Extension  Theraband;10 reps   Theraband Level (Shoulder Extension) Level 2 (Red)   Row Theraband;10 reps   Theraband Level (Shoulder Row) Level 2 (Red)   Retraction Theraband;10 reps   Theraband Level (Shoulder Retraction) Level 2 (Red)   Manual Therapy   Manual Therapy Myofascial release   Myofascial Release Myofascial release and manual stretching to left bicep, upper arm, trapezius and scapularis regions to decrease pain and increase joint range of motion.                  OT Short Term Goals - 08/21/14 1606    OT SHORT TERM GOAL #1   Title Patient will be educated on a HEP.   Time 4   Period Weeks   Status On-going   OT SHORT TERM GOAL #2   Title Patient will decrease LUE pain to a 4/10 or less during daily tasks.    Time 4   Period Weeks   Status  On-going   OT SHORT TERM GOAL #3   Title Patient will increase PROM to [facility] to increase ability to complete dressing tasks.   Time 4   Period Weeks   Status On-going   OT SHORT TERM GOAL #4   Title Patient will increase LUE strength to 3/5 to increase ability to reach overhead.   Time 4   Period Weeks   Status On-going   OT SHORT TERM GOAL #5   Title Patient will decrease fascial restrctions from mod to min amount.   Time 4   Period Weeks   Status On-going           OT Long Term Goals - 08/21/14 1607    OT LONG TERM GOAL #1   Title Patient will return to highest level of independence with all daily, leisure, and work tasks.    Time 8   Period Weeks   Status On-going   OT LONG TERM GOAL #2   Title Patient will decrease pain to 2/10 or less during daily tasks.    Time 8   Period Weeks   Status On-going   OT LONG TERM GOAL #3   Title Patient will increase AROM of LUE to WNL to increase ability to complete overhead tasks at work.    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    Time 8   Period Weeks   Status On-going   OT LONG TERM GOAL #5   Title Patient will decrease fascial restrictions to trace amount.    Time 8   Period Weeks   Status On-going               Plan - 09/05/14 1642    Clinical Impression Statement A:  Unable to complete proximal shoulder strengthening, pro/ret/elev/dep due to time.  Added Theraband scapular exercises.  Pt demonstrates good form during exercises.  Pt tolerated exercises well.   Plan P:  Complete remaining exercises.  Add therapy ball left/right to exercises.  Add theraband scapular exercises to HEP.        Problem List Patient Active Problem List   Diagnosis Date Noted  . Impingement syndrome of left shoulder 08/02/2014  . 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 Abdominal pain  06/13/2014  . Rectal bleeding 03/13/2014  . Diabetes type 2, controlled 02/18/2014  . Hyperlipidemia  02/18/2014  . Diabetic peripheral neuropathy 03/28/2013  . Irreducible incisional hernia 11/11/2010    Elba Barman, OTA Student [phone]  09/05/2014, 4:53 PM  [facility] [address] [place], [place], [address] Phone: [phone]   Fax:  [phone]

## 2014-09-10 ENCOUNTER — Ambulatory Visit (HOSPITAL_COMMUNITY): Payer: 59

## 2014-09-10 DIAGNOSIS — M25512 Pain in left shoulder: Secondary | ICD-10-CM

## 2014-09-10 DIAGNOSIS — M629 Disorder of muscle, unspecified: Secondary | ICD-10-CM

## 2014-09-10 DIAGNOSIS — Z4789 Encounter for other orthopedic aftercare: Secondary | ICD-10-CM | POA: Diagnosis not present

## 2014-09-10 DIAGNOSIS — R29898 Other symptoms and signs involving the musculoskeletal system: Secondary | ICD-10-CM

## 2014-09-10 DIAGNOSIS — M6289 Other specified disorders of muscle: Secondary | ICD-10-CM

## 2014-09-10 DIAGNOSIS — M25612 Stiffness of left shoulder, not elsewhere classified: Secondary | ICD-10-CM

## 2014-09-10 NOTE — Therapy (Signed)
[place], [place], [address] Phone: [phone]   Fax:  [phone]  Occupational Therapy Treatment  Patient Details  Name: Travis [name] MRN: [id number] Date of Birth: [birth year] Referring Provider:  Kathyrn Drown, MD  Encounter Date: 09/10/2014      OT End of Session - 09/10/14 1719    Visit Number 8   Number of Visits 16   Date for OT Re-Evaluation 10/14/14  09/16/14   Authorization Type UMR no visit limit   OT Start Time 1600   OT Stop Time 1650   OT Time Calculation (min) 50 min   Activity Tolerance Patient tolerated treatment well   Behavior During Therapy [facility] for tasks assessed/performed      Past Medical History  Diagnosis Date  . Night sweats     every once in a while  . Hyperlipidemia   . Diabetes mellitus   . Hernia, inguinal   . Dizziness   . Chronic headaches   . Arthritis   . Neuropathy of lower extremity   . Diabetic peripheral neuropathy 03/28/2013  . Diverticulosis   . Hypertension   . Complication of anesthesia     bleed after last shoulder-had to stay overnight  . Impingement syndrome of left shoulder 08/02/2014  . 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 Inguinal hernia repair Bilateral   . Cervical disc surgery    . Wrist surgery Right     fusion  . Shoulder arthroscopy Right     1  . Knee arthroscopy Right   . Colon surgery  2003    1/3 removed for diverticulitis  . Colonoscopy    . Umbilical hernia repair      with other hernia repair with mesh  . Shoulder arthroscopy Left 08/02/2014    Procedure: LEFT SHOULDER SCOPE DEBRIDEMENT/ACROMIOPLASTY;  Surgeon: [name], MD;  Location: [facility];  Service: Orthopedics;  Laterality: Left;  ANESTHESIA: GENERAL, PRE/POST OP SCALENE    There were no vitals filed for this visit.  Visit Diagnosis:  Pain in joint, shoulder region,  left  Decreased range of motion of shoulder, left  Tight fascia  Shoulder weakness      Subjective Assessment - 09/10/14 1627    Subjective  S: I've been using a 5# hand weight and just bend over and slowly move my shoulder back and forth.    Currently in Pain? Yes   Pain Score 6    Pain Location Shoulder   Pain Orientation Left   Pain Descriptors / Indicators Sore   Pain Type Acute pain            [facility] OT Assessment - 09/10/14 1623    Assessment   Diagnosis Left shoulder arthroscopy   Precautions   Precautions Shoulder   Type of Shoulder Precautions PROM through 4/15 then progressing as tolerated                  OT Treatments/Exercises (OP) - 09/10/14 1623    Exercises   Exercises Shoulder   Shoulder Exercises: Supine   Protraction PROM;5 reps;AROM;12 reps   Horizontal ABduction PROM;5 reps;AROM;12 reps   External Rotation PROM;5 reps;AROM;12 reps   Internal Rotation PROM;5 reps;AROM;12 reps   Flexion PROM;5 reps;AROM;12 reps   ABduction PROM;5 reps;AROM;12 reps   Shoulder Exercises: Seated   Protraction AROM;15 reps   Horizontal ABduction AROM;15 reps  External Rotation AROM;15 reps   Internal Rotation AROM;15 reps   Flexion AROM;15 reps   Abduction AROM;15 reps   Shoulder Exercises: ROM/Strengthening   Proximal Shoulder Strengthening, Supine 15X   Manual Therapy   Manual Therapy Myofascial release   Myofascial Release Myofascial release and muscle energy technique to left anterior deltoid  to relax tone and muscle spasm and improve range of motion.                   OT Short Term Goals - 08/21/14 1606    OT SHORT TERM GOAL #1   Title Patient will be educated on a HEP.   Time 4   Period Weeks   Status On-going   OT SHORT TERM GOAL #2   Title Patient will decrease LUE pain to a 4/10 or less during daily tasks.    Time 4   Period Weeks   Status On-going   OT SHORT TERM GOAL #3   Title Patient will increase PROM to [facility] to  increase ability to complete dressing tasks.   Time 4   Period Weeks   Status On-going   OT SHORT TERM GOAL #4   Title Patient will increase LUE strength to 3/5 to increase ability to reach overhead.   Time 4   Period Weeks   Status On-going   OT SHORT TERM GOAL #5   Title Patient will decrease fascial restrctions from mod to min amount.   Time 4   Period Weeks   Status On-going           OT Long Term Goals - 08/21/14 1607    OT LONG TERM GOAL #1   Title Patient will return to highest level of independence with all daily, leisure, and work tasks.    Time 8   Period Weeks   Status On-going   OT LONG TERM GOAL #2   Title Patient will decrease pain to 2/10 or less during daily tasks.    Time 8   Period Weeks   Status On-going   OT LONG TERM GOAL #3   Title Patient will increase AROM of LUE to WNL to increase ability to complete overhead tasks at work.    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    Time 8   Period Weeks   Status On-going   OT LONG TERM GOAL #5   Title Patient will decrease fascial restrictions to trace amount.    Time 8   Period Weeks   Status On-going               Plan - 09/10/14 1720    Clinical Impression Statement A: Added AROM supine and standing. Updated HEP with theraband scapular exercises. Patient tolerated well.    Plan P: Update HEP to include AROM exercises. Complete UBE bike, X to V arms and W arms.         Problem List Patient Active Problem List   Diagnosis Date Noted  . Impingement syndrome of left shoulder 08/02/2014  . 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 Abdominal pain 06/13/2014  . Rectal bleeding 03/13/2014  . Diabetes type 2, controlled 02/18/2014  . Hyperlipidemia 02/18/2014  . Diabetic peripheral neuropathy 03/28/2013  . Irreducible incisional hernia 11/11/2010     Ailene Ravel, OTR/L,CBIS  [phone]  09/10/2014, 5:21 PM  [place], [place], [address] Phone: [phone]   Fax:  [phone]

## 2014-09-10 NOTE — Patient Instructions (Signed)
(  Home) Extension: Isometric / Bilateral Arm Retraction - Sitting   Facing anchor, hold hands and elbow at shoulder height, with elbow bent.  Pull arms back to squeeze shoulder blades together. Repeat 10-15 times.  Copyright  VHI. All rights reserved.   (Home) Retraction: Row - Bilateral (Anchor)   Facing anchor, arms reaching forward, pull hands toward stomach, keeping elbows bent and at your sides and pinching shoulder blades together. Repeat 10-15 times.  Copyright  VHI. All rights reserved.   (Clinic) Extension / Flexion (Assist)   Face anchor, pull arms back, keeping elbow straight, and squeze shoulder blades together. Repeat 10-15 times.   Copyright  VHI. All rights reserved.   

## 2014-09-11 ENCOUNTER — Ambulatory Visit (INDEPENDENT_AMBULATORY_CARE_PROVIDER_SITE_OTHER): Payer: 59 | Admitting: Family Medicine

## 2014-09-11 ENCOUNTER — Encounter: Payer: Self-pay | Admitting: Family Medicine

## 2014-09-11 VITALS — BP 124/86 | Ht 73.0 in | Wt 191.4 lb

## 2014-09-11 DIAGNOSIS — Z125 Encounter for screening for malignant neoplasm of prostate: Secondary | ICD-10-CM

## 2014-09-11 DIAGNOSIS — T148 Other injury of unspecified body region: Secondary | ICD-10-CM | POA: Diagnosis not present

## 2014-09-11 DIAGNOSIS — E785 Hyperlipidemia, unspecified: Secondary | ICD-10-CM | POA: Diagnosis not present

## 2014-09-11 DIAGNOSIS — W57XXXA Bitten or stung by nonvenomous insect and other nonvenomous arthropods, initial encounter: Secondary | ICD-10-CM

## 2014-09-11 DIAGNOSIS — E119 Type 2 diabetes mellitus without complications: Secondary | ICD-10-CM | POA: Diagnosis not present

## 2014-09-11 LAB — POCT GLYCOSYLATED HEMOGLOBIN (HGB A1C): Hemoglobin A1C: 6

## 2014-09-11 MED ORDER — VALACYCLOVIR HCL 1 G PO TABS: 1000.0000 mg | ORAL_TABLET | Freq: Every day | ORAL | Status: DC

## 2014-09-11 NOTE — Progress Notes (Signed)
   Subjective:    Patient ID: Travis [name], male    DOB: October 22, 1960, 54 y.o.   MRN: [id number]  Diabetes He presents for his follow-up diabetic visit. He has type 2 diabetes mellitus. Pertinent negatives for hypoglycemia include no confusion. Pertinent negatives for diabetes include no chest pain, no fatigue, no polydipsia, no polyphagia and no weakness. Risk factors for coronary artery disease include dyslipidemia, diabetes mellitus and hypertension. Current diabetic treatment includes oral agent (dual therapy). He is compliant with treatment all of the time. He is following a diabetic diet.   Patient is on low-dose lisinopril because your micro-proteinuria but does not have true hypertension Has a history hyperlipidemia the importance of taking medication discuss as well as watching diet He denies any low sugar spells unless he skips a meal He had recent shoulder surgery and is gradually getting better   Review of Systems  Constitutional: Negative for activity change, appetite change and fatigue.  HENT: Negative for congestion.   Respiratory: Negative for cough.   Cardiovascular: Negative for chest pain.  Gastrointestinal: Negative for abdominal pain.  Endocrine: Negative for polydipsia and polyphagia.  Neurological: Negative for weakness.  Psychiatric/Behavioral: Negative for confusion.       Objective:   Physical Exam  Constitutional: He appears well-nourished. No distress.  Cardiovascular: Normal rate, regular rhythm and normal heart sounds.   No murmur heard. Pulmonary/Chest: Effort normal and breath sounds normal. No respiratory distress.  Musculoskeletal: He exhibits no edema.  Lymphadenopathy:    He has no cervical adenopathy.  Neurological: He is alert.  Psychiatric: His behavior is normal.  Vitals reviewed.    Patient had multiple tick bites on his legs and on his arms but none of them appeared infected warning signs regarding tick bites was discussed       Assessment & Plan:  Hyperlipidemia continue medication watch diet closely Diabetes A1c looks much improved if having low sugar spells to let us know Neuropathy in the feet actually doing better Lyrica currently taking it 3 times daily Your micro-proteinuria no need to recheck this currently continue lisinopril Lab work ordered 25 minutes spent with patient

## 2014-09-11 NOTE — Patient Instructions (Signed)
Tick Bite Information Ticks are insects that attach themselves to the skin and draw blood for food. There are various types of ticks. Common types include wood ticks and deer ticks. Most ticks live in shrubs and grassy areas. Ticks can climb onto your body when you make contact with leaves or grass where the tick is waiting. The most common places on the body for ticks to attach themselves are the scalp, neck, armpits, waist, and groin. Most tick bites are harmless, but sometimes ticks carry germs that cause diseases. These germs can be spread to a person during the tick's feeding process. The chance of a disease spreading through a tick bite depends on:   The type of tick.  Time of year.   How long the tick is attached.   Geographic location.  HOW CAN YOU PREVENT TICK BITES? Take these steps to help prevent tick bites when you are outdoors:  Wear protective clothing. Long sleeves and long pants are best.   Wear white clothes so you can see ticks more easily.  Tuck your pant legs into your socks.   If walking on a trail, stay in the middle of the trail to avoid brushing against bushes.  Avoid walking through areas with long grass.  Put insect repellent on all exposed skin and along boot tops, pant legs, and sleeve cuffs.   Check clothing, hair, and skin repeatedly and before going inside.   Brush off any ticks that are not attached.  Take a shower or bath as soon as possible after being outdoors.  WHAT IS THE PROPER WAY TO REMOVE A TICK? Ticks should be removed as soon as possible to help prevent diseases caused by tick bites. 1. If latex gloves are available, put them on before trying to remove a tick.  2. Using fine-point tweezers, grasp the tick as close to the skin as possible. You may also use curved forceps or a tick removal tool. Grasp the tick as close to its head as possible. Avoid grasping the tick on its body. 3. Pull gently with steady upward pressure until  the tick lets go. Do not twist the tick or jerk it suddenly. This may break off the tick's head or mouth parts. 4. Do not squeeze or crush the tick's body. This could force disease-carrying fluids from the tick into your body.  5. After the tick is removed, wash the bite area and your hands with soap and water or other disinfectant such as alcohol. 6. Apply a small amount of antiseptic cream or ointment to the bite site.  7. Wash and disinfect any instruments that were used.  Do not try to remove a tick by applying a hot match, petroleum jelly, or fingernail polish to the tick. These methods do not work and may increase the chances of disease being spread from the tick bite.  WHEN SHOULD YOU SEEK MEDICAL CARE? Contact your health care provider if you are unable to remove a tick from your skin or if a part of the tick breaks off and is stuck in the skin.  After a tick bite, you need to be aware of signs and symptoms that could be related to diseases spread by ticks. Contact your health care provider if you develop any of the following in the days or weeks after the tick bite:  Unexplained fever.  Rash. A circular rash that appears days or weeks after the tick bite may indicate the possibility of Lyme disease. The rash may resemble   a target with a bull's-eye and may occur at a different part of your body than the tick bite.  Redness and swelling in the area of the tick bite.   Tender, swollen lymph glands.   Diarrhea.   Weight loss.   Cough.   Fatigue.   Muscle, joint, or bone pain.   Abdominal pain.   Headache.   Lethargy or a change in your level of consciousness.  Difficulty walking or moving your legs.   Numbness in the legs.   Paralysis.  Shortness of breath.   Confusion.   Repeated vomiting.  Document Released: 04/16/2000 Document Revised: 02/07/2013 Document Reviewed: 09/27/2012 Exit[employer] Patient Information 2015 [employer]. This information is  not intended to replace advice given to you by your health care provider. Make sure you discuss any questions you have with your health care provider.  

## 2014-09-12 ENCOUNTER — Ambulatory Visit (HOSPITAL_COMMUNITY): Payer: 59

## 2014-09-12 DIAGNOSIS — R29898 Other symptoms and signs involving the musculoskeletal system: Secondary | ICD-10-CM

## 2014-09-12 DIAGNOSIS — M6289 Other specified disorders of muscle: Secondary | ICD-10-CM

## 2014-09-12 DIAGNOSIS — M25612 Stiffness of left shoulder, not elsewhere classified: Secondary | ICD-10-CM

## 2014-09-12 DIAGNOSIS — Z4789 Encounter for other orthopedic aftercare: Secondary | ICD-10-CM | POA: Diagnosis not present

## 2014-09-12 DIAGNOSIS — M629 Disorder of muscle, unspecified: Secondary | ICD-10-CM

## 2014-09-12 DIAGNOSIS — M25512 Pain in left shoulder: Secondary | ICD-10-CM

## 2014-09-12 NOTE — Therapy (Signed)
[place], [place], [address] Phone: [phone]   Fax:  [phone]  Occupational Therapy Treatment  Patient Details  Name: Travis [name] MRN: [id number] Date of Birth: 1961/02/19 Referring Provider:  Kathyrn Drown, MD  Encounter Date: 09/12/2014      OT End of Session - 09/12/14 1650    Visit Number 9   Number of Visits 16   Date for OT Re-Evaluation 10/14/14  09/16/14   Authorization Type UMR no visit limit   OT Start Time 1605   OT Stop Time 1653   OT Time Calculation (min) 48 min   Activity Tolerance Patient tolerated treatment well   Behavior During Therapy [facility] for tasks assessed/performed      Past Medical History  Diagnosis Date  . Night sweats     every once in a while  . Hyperlipidemia   . Diabetes mellitus   . Hernia, inguinal   . Dizziness   . Chronic headaches   . Arthritis   . Neuropathy of lower extremity   . Diabetic peripheral neuropathy 03/28/2013  . Diverticulosis   . Hypertension   . Complication of anesthesia     bleed after last shoulder-had to stay overnight  . Impingement syndrome of left shoulder 08/02/2014  . 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 Inguinal hernia repair Bilateral   . Cervical disc surgery    . Wrist surgery Right     fusion  . Shoulder arthroscopy Right     1  . Knee arthroscopy Right   . Colon surgery  2003    1/3 removed for diverticulitis  . Colonoscopy    . Umbilical hernia repair      with other hernia repair with mesh  . Shoulder arthroscopy Left 08/02/2014    Procedure: LEFT SHOULDER SCOPE DEBRIDEMENT/ACROMIOPLASTY;  Surgeon: [name], MD;  Location: [place];  Service: Orthopedics;  Laterality: Left;  ANESTHESIA: GENERAL, PRE/POST OP SCALENE    There were no vitals filed for this visit.  Visit Diagnosis:  Pain in joint, shoulder region,  left  Decreased range of motion of shoulder, left  Tight fascia  Shoulder weakness      Subjective Assessment - 09/12/14 1627    Subjective  S: I've been using my golf club at home and really going through the stretches with this arm.    Currently in Pain? Yes   Pain Score 6    Pain Location Shoulder   Pain Orientation Left   Pain Descriptors / Indicators Sore   Pain Type Acute pain            [facility] OT Assessment - 09/12/14 1629    Assessment   Diagnosis Left shoulder arthroscopy   Precautions   Precautions Shoulder   Type of Shoulder Precautions PROM through 4/15 then progressing as tolerated                  OT Treatments/Exercises (OP) - 09/12/14 1629    Exercises   Exercises Shoulder   Shoulder Exercises: Supine   Protraction PROM;5 reps;AROM;20 reps   Horizontal ABduction PROM;5 reps;AROM;20 reps   External Rotation PROM;5 reps;AROM;20 reps   Internal Rotation PROM;5 reps;AROM;20 reps   Flexion PROM;5 reps;AROM;15 reps   ABduction PROM;5 reps;AROM;20 reps   Shoulder Exercises: Standing   Protraction AROM;15 reps   Horizontal ABduction AROM;15 reps   External Rotation  AROM;15 reps   Internal Rotation AROM;15 reps   Flexion AROM;15 reps   ABduction AROM;15 reps   Shoulder Exercises: ROM/Strengthening   UBE (Upper Arm Bike) Level 1 3' forward 3' reverse   X to V Arms 15X   Proximal Shoulder Strengthening, Supine 20X with rest breaks   Proximal Shoulder Strengthening, Seated 20X with rest breaks   Manual Therapy   Manual Therapy Myofascial release   Myofascial Release Myofascial release and muscle energy technique to left anterior deltoid  to relax tone and muscle spasm and improve range of motion.                 OT Education - 09/12/14 1649    Education provided Yes   Education Details AROM exercises   Person(s) Educated Patient   Methods Explanation;Demonstration;Handout   Comprehension Verbalized understanding;Returned  demonstration          OT Short Term Goals - 08/21/14 1606    OT SHORT TERM GOAL #1   Title Patient will be educated on a HEP.   Time 4   Period Weeks   Status On-going   OT SHORT TERM GOAL #2   Title Patient will decrease LUE pain to a 4/10 or less during daily tasks.    Time 4   Period Weeks   Status On-going   OT SHORT TERM GOAL #3   Title Patient will increase PROM to [facility] to increase ability to complete dressing tasks.   Time 4   Period Weeks   Status On-going   OT SHORT TERM GOAL #4   Title Patient will increase LUE strength to 3/5 to increase ability to reach overhead.   Time 4   Period Weeks   Status On-going   OT SHORT TERM GOAL #5   Title Patient will decrease fascial restrctions from mod to min amount.   Time 4   Period Weeks   Status On-going           OT Long Term Goals - 08/21/14 1607    OT LONG TERM GOAL #1   Title Patient will return to highest level of independence with all daily, leisure, and work tasks.    Time 8   Period Weeks   Status On-going   OT LONG TERM GOAL #2   Title Patient will decrease pain to 2/10 or less during daily tasks.    Time 8   Period Weeks   Status On-going   OT LONG TERM GOAL #3   Title Patient will increase AROM of LUE to WNL to increase ability to complete overhead tasks at work.    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    Time 8   Period Weeks   Status On-going   OT LONG TERM GOAL #5   Title Patient will decrease fascial restrictions to trace amount.    Time 8   Period Weeks   Status On-going               Plan - 09/12/14 1651    Clinical Impression Statement A: Added AROM exercises to HEP. Added AROM standing, UBE bike, and X to V arms. Pt tolerated well. W arms not completed due to time constraint.    Plan P: Add W arms and ball on the wall.  Problem  List Patient Active Problem List   Diagnosis Date Noted  . Impingement syndrome of left shoulder 08/02/2014  . 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 Abdominal pain 06/13/2014  . Rectal bleeding 03/13/2014  . Diabetes type 2, controlled 02/18/2014  . Hyperlipidemia 02/18/2014  . Diabetic peripheral neuropathy 03/28/2013  . Irreducible incisional hernia 11/11/2010    Ailene Ravel, OTR/L,CBIS  [phone]  09/12/2014, 5:07 PM  [place] [address] [place], [place], [address] Phone: [phone]   Fax:  [phone]

## 2014-09-12 NOTE — Patient Instructions (Signed)
1) Shoulder Protraction    Begin with elbows by your side, slowly "punch" straight out in front of you keeping arms/elbows straight. Repeat _15__times, __2__set/day.     2) Shoulder Flexion     Standing:         Begin with arms at your side with thumbs pointed up, slowly raise both arms up and forward towards overhead. Repeat_15__times, _2__set/day.       3) Horizontal abduction/adduction   Standing:           Begin with arms straight out in front of you, bring out to the side in at "T" shape. Keep arms straight entire time. Repeat _15___times, _2___sets/day.       4) Internal & External Rotation    *No band* -Stand with elbows at the side and elbows bent 90 degrees. Move your forearms away from your body, then bring back inward toward the body.  Repeat _15__times, _2__sets/day    5) Shoulder Abduction      Standing:       Lying on your back begin with your arms flat on the table next to your side. Slowly move your arms out to the side so that they go overhead, in a jumping jack or snow angel movement. Repeat _15__times, __2_sets/day

## 2014-09-16 ENCOUNTER — Ambulatory Visit (HOSPITAL_COMMUNITY): Payer: 59 | Admitting: Specialist

## 2014-09-16 DIAGNOSIS — Z4789 Encounter for other orthopedic aftercare: Secondary | ICD-10-CM | POA: Diagnosis not present

## 2014-09-16 DIAGNOSIS — M25512 Pain in left shoulder: Secondary | ICD-10-CM

## 2014-09-16 DIAGNOSIS — M25612 Stiffness of left shoulder, not elsewhere classified: Secondary | ICD-10-CM

## 2014-09-16 DIAGNOSIS — M6289 Other specified disorders of muscle: Secondary | ICD-10-CM

## 2014-09-16 DIAGNOSIS — M629 Disorder of muscle, unspecified: Secondary | ICD-10-CM

## 2014-09-16 NOTE — Therapy (Signed)
[place], [place], [address] Phone: [phone]   Fax:  [phone]  Occupational Therapy Treatment  Patient Details  Name: Travis [name] MRN: [id number] Date of Birth: Nov 02, 1960 Referring Provider:  [name], MD  Encounter Date: 09/16/2014      OT End of Session - 09/16/14 1605    Visit Number 10   Number of Visits 16   Date for OT Re-Evaluation 10/14/14   Authorization Type UMR no visit limit   OT Start Time 1527   OT Stop Time 1609   OT Time Calculation (min) 42 min   Activity Tolerance Patient tolerated treatment well   Behavior During Therapy [facility] for tasks assessed/performed      Past Medical History  Diagnosis Date  . Night sweats     every once in a while  . Hyperlipidemia   . Diabetes mellitus   . Hernia, inguinal   . Dizziness   . Chronic headaches   . Arthritis   . Neuropathy of lower extremity   . Diabetic peripheral neuropathy 03/28/2013  . Diverticulosis   . Hypertension   . Complication of anesthesia     bleed after last shoulder-had to stay overnight  . Impingement syndrome of left shoulder 08/02/2014  . 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 Inguinal hernia repair Bilateral   . Cervical disc surgery    . Wrist surgery Right     fusion  . Shoulder arthroscopy Right     1  . Knee arthroscopy Right   . Colon surgery  2003    1/3 removed for diverticulitis  . Colonoscopy    . Umbilical hernia repair      with other hernia repair with mesh  . Shoulder arthroscopy Left 08/02/2014    Procedure: LEFT SHOULDER SCOPE DEBRIDEMENT/ACROMIOPLASTY;  Surgeon: [name], MD;  Location: [place];  Service: Orthopedics;  Laterality: Left;  ANESTHESIA: GENERAL, PRE/POST OP SCALENE    There were no vitals filed for this visit.  Visit Diagnosis:  Pain in joint, shoulder region, left  Decreased range  of motion of shoulder, left  Tight fascia      Subjective Assessment - 09/16/14 1529    Subjective  S:  I can start golfing with a 75% swing in June.  I can reach across my body and reach for a door easier, reaching overhead is still hard, but it is easier.  My arm still doesnt have any strength.    Pain Score 5    Pain Location Shoulder   Pain Orientation Left   Pain Descriptors / Indicators Sore   Pain Type Acute pain            [facility] OT Assessment - 09/16/14 0001    Assessment   Diagnosis Left shoulder arthroscopy   Precautions   Precautions Shoulder   Type of Shoulder Precautions PROM through 4/15 then progressing as tolerated   ADL   ADL comments increased ease with activities at waist height reaching across his body and to shoulder height.  Continues to have difficulty reaching overhead and reaching behind back.    Palpation   Palpation min fascial restrictions   AROM   Overall AROM Comments assessed in standing, ER/IR with shoulder abducted    Left Shoulder Flexion 142 Degrees   Left Shoulder ABduction 149 Degrees   Left Shoulder Internal Rotation 55 Degrees  Left Shoulder External Rotation 75 Degrees   PROM   Overall PROM Comments Assessed supine. IR/ER adducted (08/19/14)   Left Shoulder Flexion 155 Degrees  140   Left Shoulder ABduction 175 Degrees  110   Left Shoulder Internal Rotation 90 Degrees  90   Left Shoulder External Rotation 80 Degrees  90   Strength   Left Shoulder Flexion 4-/5   Left Shoulder Extension 4-/5   Left Shoulder ABduction 4-/5   Left Shoulder Internal Rotation 4/5   Left Shoulder External Rotation 4-/5                  OT Treatments/Exercises (OP) - 09/16/14 0001    Shoulder Exercises: Supine   Protraction PROM;5 reps   Horizontal ABduction PROM;5 reps   External Rotation PROM;5 reps   Internal Rotation PROM;5 reps   Flexion PROM;5 reps   ABduction PROM;5 reps   Shoulder Exercises: ROM/Strengthening   UBE (Upper  Arm Bike) Level 1 3' forward 3' reverse   Manual Therapy   Manual Therapy Myofascial release   Myofascial Release Myofascial release and muscle energy technique to left anterior deltoid  to relax tone and muscle spasm and improve range of motion.                   OT Short Term Goals - 09/16/14 1555    OT SHORT TERM GOAL #1   Title Patient will be educated on a HEP.   Status Achieved   OT SHORT TERM GOAL #2   Title Patient will decrease LUE pain to a 4/10 or less during daily tasks.    Status On-going   OT SHORT TERM GOAL #3   Title Patient will increase PROM to [facility] to increase ability to complete dressing tasks.   Status Achieved   OT SHORT TERM GOAL #4   Title Patient will increase LUE strength to 3/5 to increase ability to reach overhead.   Status Achieved   OT SHORT TERM GOAL #5   Title Patient will decrease fascial restrctions from mod to min amount.   Status Achieved           OT Long Term Goals - 08/21/14 1607    OT LONG TERM GOAL #1   Title Patient will return to highest level of independence with all daily, leisure, and work tasks.    Time 8   Period Weeks   Status On-going   OT LONG TERM GOAL #2   Title Patient will decrease pain to 2/10 or less during daily tasks.    Time 8   Period Weeks   Status On-going   OT LONG TERM GOAL #3   Title Patient will increase AROM of LUE to WNL to increase ability to complete overhead tasks at work.    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    Time 8   Period Weeks   Status On-going   OT LONG TERM GOAL #5   Title Patient will decrease fascial restrictions to trace amount.    Time 8   Period Weeks   Status On-going               Plan - 09/16/14 1606    Clinical Impression Statement A:  patient has made significant improvements in PROM, AROM, and strength.  He  has met 4/5  short term goals and is progressing towards long term goals.    OT Frequency 3x / week   OT Duration 4 weeks   Plan P:  Focus on end range AROM and strengthening in order to return to full duties at work.  Begin W arms and ball on the wall for scapular stability.   Consulted and Agree with Plan of Care Patient        Problem List Patient Active Problem List   Diagnosis Date Noted  . Impingement syndrome of left shoulder 08/02/2014  . 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 Abdominal pain 06/13/2014  . Rectal bleeding 03/13/2014  . Diabetes type 2, controlled 02/18/2014  . Hyperlipidemia 02/18/2014  . Diabetic peripheral neuropathy 03/28/2013  . Irreducible incisional hernia 11/11/2010    Vangie Bicker, OTR/L [phone]  09/16/2014, 4:09 PM  [place] [address] [place], [place], [address] Phone: [phone]   Fax:  [phone]

## 2014-09-18 ENCOUNTER — Ambulatory Visit (HOSPITAL_COMMUNITY): Payer: 59 | Admitting: Occupational Therapy

## 2014-09-18 ENCOUNTER — Encounter (HOSPITAL_COMMUNITY): Payer: Self-pay | Admitting: Occupational Therapy

## 2014-09-18 DIAGNOSIS — R29898 Other symptoms and signs involving the musculoskeletal system: Secondary | ICD-10-CM

## 2014-09-18 DIAGNOSIS — M25512 Pain in left shoulder: Secondary | ICD-10-CM

## 2014-09-18 DIAGNOSIS — M25612 Stiffness of left shoulder, not elsewhere classified: Secondary | ICD-10-CM

## 2014-09-18 DIAGNOSIS — Z4789 Encounter for other orthopedic aftercare: Secondary | ICD-10-CM | POA: Diagnosis not present

## 2014-09-18 DIAGNOSIS — M629 Disorder of muscle, unspecified: Secondary | ICD-10-CM

## 2014-09-18 DIAGNOSIS — M6289 Other specified disorders of muscle: Secondary | ICD-10-CM

## 2014-09-18 NOTE — Therapy (Signed)
[place], [place], [address] Phone: [phone]   Fax:  [phone]  Occupational Therapy Treatment  Patient Details  Name: Travis [name] MRN: [id number] Date of Birth: 05/02/1961 Referring Provider:  [name], MD  Encounter Date: 09/18/2014      OT End of Session - 09/18/14 1606    Visit Number 11   Number of Visits 16   Date for OT Re-Evaluation 10/14/14   Authorization Type UMR no visit limit   OT Start Time 1518   OT Stop Time 1611   OT Time Calculation (min) 53 min   Activity Tolerance Patient tolerated treatment well   Behavior During Therapy [facility] for tasks assessed/performed      Past Medical History  Diagnosis Date  . Night sweats     every once in a while  . Hyperlipidemia   . Diabetes mellitus   . Hernia, inguinal   . Dizziness   . Chronic headaches   . Arthritis   . Neuropathy of lower extremity   . Diabetic peripheral neuropathy 03/28/2013  . Diverticulosis   . Hypertension   . Complication of anesthesia     bleed after last shoulder-had to stay overnight  . Impingement syndrome of left shoulder 08/02/2014  . 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 Inguinal hernia repair Bilateral   . Cervical disc surgery    . Wrist surgery Right     fusion  . Shoulder arthroscopy Right     1  . Knee arthroscopy Right   . Colon surgery  2003    1/3 removed for diverticulitis  . Colonoscopy    . Umbilical hernia repair      with other hernia repair with mesh  . Shoulder arthroscopy Left 08/02/2014    Procedure: LEFT SHOULDER SCOPE DEBRIDEMENT/ACROMIOPLASTY;  Surgeon: [name], MD;  Location: [place];  Service: Orthopedics;  Laterality: Left;  ANESTHESIA: GENERAL, PRE/POST OP SCALENE    There were no vitals filed for this visit.  Visit Diagnosis:  Pain in joint, shoulder region, left  Decreased range  of motion of shoulder, left  Tight fascia  Shoulder weakness      Subjective Assessment - 09/18/14 1520    Subjective  S: Today is not a great day, this rain is not helping.    Currently in Pain? Yes   Pain Score 5    Pain Location Shoulder   Pain Orientation Left   Pain Descriptors / Indicators Sore   Pain Type Acute pain            [facility] OT Assessment - 09/18/14 1517    Assessment   Diagnosis Left shoulder arthroscopy   Precautions   Precautions Shoulder   Type of Shoulder Precautions PROM through 4/15 then progressing as tolerated          OT Treatments/Exercises (OP) - 09/18/14 1521    Exercises   Exercises Shoulder   Shoulder Exercises: Supine   Protraction PROM;5 reps;AROM;20 reps   Horizontal ABduction PROM;5 reps;AROM;20 reps   External Rotation PROM;5 reps;AROM;20 reps   Internal Rotation PROM;5 reps;AROM;20 reps   Flexion PROM;5 reps;AROM;20 reps   ABduction PROM;5 reps;AROM;20 reps   Shoulder Exercises: Standing   Protraction AROM;15 reps   Horizontal ABduction AROM;15 reps   External Rotation AROM;15 reps   Internal Rotation AROM;15 reps   Flexion AROM;15 reps   ABduction AROM;15  reps   Shoulder Exercises: ROM/Strengthening   UBE (Upper Arm Bike) Level 1 3' forward 3' reverse   "W" Arms 15X   X to V Arms 15X   Ball on Wall 1' flexion 1' abduction   Manual Therapy   Manual Therapy Myofascial release   Myofascial Release Myofascial release and muscle energy technique to left anterior deltoid  to relax tone and muscle spasm and improve range of motion.            OT Short Term Goals - 09/16/14 1555    OT SHORT TERM GOAL #1   Title Patient will be educated on a HEP.   Status Achieved   OT SHORT TERM GOAL #2   Title Patient will decrease LUE pain to a 4/10 or less during daily tasks.    Status On-going   OT SHORT TERM GOAL #3   Title Patient will increase PROM to [facility] to increase ability to complete dressing tasks.   Status Achieved   OT  SHORT TERM GOAL #4   Title Patient will increase LUE strength to 3/5 to increase ability to reach overhead.   Status Achieved   OT SHORT TERM GOAL #5   Title Patient will decrease fascial restrctions from mod to min amount.   Status Achieved           OT Long Term Goals - 08/21/14 1607    OT LONG TERM GOAL #1   Title Patient will return to highest level of independence with all daily, leisure, and work tasks.    Time 8   Period Weeks   Status On-going   OT LONG TERM GOAL #2   Title Patient will decrease pain to 2/10 or less during daily tasks.    Time 8   Period Weeks   Status On-going   OT LONG TERM GOAL #3   Title Patient will increase AROM of LUE to WNL to increase ability to complete overhead tasks at work.    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    Time 8   Period Weeks   Status On-going   OT LONG TERM GOAL #5   Title Patient will decrease fascial restrictions to trace amount.    Time 8   Period Weeks   Status On-going               Plan - 09/18/14 1606    Clinical Impression Statement A: Added ball on wall and w arms this date. Pt striving for full end range AROM in supine and standing. Pt tolerated treatment well, and is continuing to complete HEP.    Plan P: Add 1# weight in supine. Focus on proper form during x to v arms.         Problem List Patient Active Problem List   Diagnosis Date Noted  . Impingement syndrome of left shoulder 08/02/2014  . 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 Abdominal pain 06/13/2014  . Rectal bleeding 03/13/2014  . Diabetes type 2, controlled 02/18/2014  . Hyperlipidemia 02/18/2014  . Diabetic peripheral neuropathy 03/28/2013  . Irreducible incisional hernia 11/11/2010    Guadelupe Sabin, OTR/L  [phone]  09/18/2014, 4:13 PM  [place] [address] [place], [place], [address] Phone: [phone]   Fax:  [phone]

## 2014-09-24 ENCOUNTER — Encounter (HOSPITAL_COMMUNITY): Payer: Self-pay

## 2014-09-24 ENCOUNTER — Ambulatory Visit (HOSPITAL_COMMUNITY): Payer: 59

## 2014-09-24 DIAGNOSIS — Z4789 Encounter for other orthopedic aftercare: Secondary | ICD-10-CM | POA: Diagnosis not present

## 2014-09-24 DIAGNOSIS — M6289 Other specified disorders of muscle: Secondary | ICD-10-CM

## 2014-09-24 DIAGNOSIS — M629 Disorder of muscle, unspecified: Secondary | ICD-10-CM

## 2014-09-24 DIAGNOSIS — M25612 Stiffness of left shoulder, not elsewhere classified: Secondary | ICD-10-CM

## 2014-09-24 DIAGNOSIS — R29898 Other symptoms and signs involving the musculoskeletal system: Secondary | ICD-10-CM

## 2014-09-24 NOTE — Therapy (Signed)
[place], [place], [address] Phone: [phone]   Fax:  [phone]  Occupational Therapy Treatment  Patient Details  Name: Travis [name] MRN: [id number] Date of Birth: 1961-03-08 Referring Provider:  [name], MD  Encounter Date: 09/24/2014      OT End of Session - 09/24/14 1600    Visit Number 12   Number of Visits 16   Date for OT Re-Evaluation 10/14/14   Authorization Type UMR no visit limit   OT Start Time 1522   OT Stop Time 1606   OT Time Calculation (min) 44 min   Activity Tolerance Patient tolerated treatment well   Behavior During Therapy [facility] for tasks assessed/performed      Past Medical History  Diagnosis Date  . Night sweats     every once in a while  . Hyperlipidemia   . Diabetes mellitus   . Hernia, inguinal   . Dizziness   . Chronic headaches   . Arthritis   . Neuropathy of lower extremity   . Diabetic peripheral neuropathy 03/28/2013  . Diverticulosis   . Hypertension   . Complication of anesthesia     bleed after last shoulder-had to stay overnight  . Impingement syndrome of left shoulder 08/02/2014  . 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 Inguinal hernia repair Bilateral   . Cervical disc surgery    . Wrist surgery Right     fusion  . Shoulder arthroscopy Right     1  . Knee arthroscopy Right   . Colon surgery  2003    1/3 removed for diverticulitis  . Colonoscopy    . Umbilical hernia repair      with other hernia repair with mesh  . Shoulder arthroscopy Left 08/02/2014    Procedure: LEFT SHOULDER SCOPE DEBRIDEMENT/ACROMIOPLASTY;  Surgeon: [name], MD;  Location: [place];  Service: Orthopedics;  Laterality: Left;  ANESTHESIA: GENERAL, PRE/POST OP SCALENE    There were no vitals filed for this visit.  Visit Diagnosis:  Decreased range of motion of shoulder, left  Tight  fascia  Shoulder weakness      Subjective Assessment - 09/24/14 1523    Subjective  S" Sunday it was aching realy bad but I iced down.   Currently in Pain? No/denies            [facility] OT Assessment - 09/24/14 1524    Assessment   Diagnosis Left shoulder arthroscopy   Precautions   Precautions Shoulder   Type of Shoulder Precautions PROM through 4/15 then progressing as tolerated                  OT Treatments/Exercises (OP) - 09/24/14 1524    Exercises   Exercises Shoulder   Shoulder Exercises: Supine   Protraction PROM;5 reps;Strengthening;15 reps   Protraction Weight (lbs) 1   Horizontal ABduction PROM;5 reps;Strengthening;15 reps   Horizontal ABduction Weight (lbs) 1   External Rotation PROM;5 reps;Strengthening;15 reps   External Rotation Weight (lbs) 1   Internal Rotation PROM;5 reps;Strengthening;15 reps   Internal Rotation Weight (lbs) 1   Flexion PROM;5 reps;Strengthening;15 reps   Shoulder Flexion Weight (lbs) 1   ABduction PROM;5 reps;Strengthening;15 reps   Shoulder ABduction Weight (lbs) 1   Shoulder Exercises: Standing   Protraction Strengthening;10 reps   Protraction Weight (lbs) 1   Horizontal ABduction Strengthening;10 reps   Horizontal ABduction  Weight (lbs) 1   External Rotation Strengthening;10 reps   External Rotation Weight (lbs) 1   Internal Rotation Strengthening;10 reps   Internal Rotation Weight (lbs) 1   Flexion Strengthening;10 reps   Shoulder Flexion Weight (lbs) 1   ABduction Strengthening;10 reps   Shoulder ABduction Weight (lbs) 1   Shoulder Exercises: ROM/Strengthening   UBE (Upper Arm Bike) Level 1 3' forward 3' reverse   "W" Arms 10X with 1#   X to V Arms 10X with 1#   Proximal Shoulder Strengthening, Supine 15X with 1#   Proximal Shoulder Strengthening, Seated 10X with 1# with rest breaks   Manual Therapy   Manual Therapy Myofascial release   Myofascial Release Myofascial release and muscle energy technique to  left anterior deltoid  to relax tone and muscle spasm and improve range of motion.                   OT Short Term Goals - 09/24/14 1601    OT SHORT TERM GOAL #1   Title Patient will be educated on a HEP.   OT SHORT TERM GOAL #2   Title Patient will decrease LUE pain to a 4/10 or less during daily tasks.    Status On-going   OT SHORT TERM GOAL #3   Title Patient will increase PROM to [facility] to increase ability to complete dressing tasks.   OT SHORT TERM GOAL #4   Title Patient will increase LUE strength to 3/5 to increase ability to reach overhead.   OT SHORT TERM GOAL #5   Title Patient will decrease fascial restrctions from mod to min amount.           OT Long Term Goals - 08/21/14 1607    OT LONG TERM GOAL #1   Title Patient will return to highest level of independence with all daily, leisure, and work tasks.    Time 8   Period Weeks   Status On-going   OT LONG TERM GOAL #2   Title Patient will decrease pain to 2/10 or less during daily tasks.    Time 8   Period Weeks   Status On-going   OT LONG TERM GOAL #3   Title Patient will increase AROM of LUE to WNL to increase ability to complete overhead tasks at work.    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    Time 8   Period Weeks   Status On-going   OT LONG TERM GOAL #5   Title Patient will decrease fascial restrictions to trace amount.    Time 8   Period Weeks   Status On-going               Plan - 09/24/14 1601    Clinical Impression Statement A: Added 1# handweight supine and standing. Patient tolerated well.    Plan P: Add cybex row and press.         Problem List Patient Active Problem List   Diagnosis Date Noted  . Impingement syndrome of left shoulder 08/02/2014  . 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 Abdominal pain 06/13/2014  . Rectal  bleeding 03/13/2014  . Diabetes type 2, controlled 02/18/2014  . Hyperlipidemia 02/18/2014  . Diabetic peripheral neuropathy 03/28/2013  . Irreducible incisional hernia 11/11/2010    Ailene Ravel,  OTR/L,CBIS  [phone]  09/24/2014, 4:02 PM  [place] [address] [place], [place], [address] Phone: [phone]   Fax:  [phone]

## 2014-09-26 ENCOUNTER — Ambulatory Visit (HOSPITAL_COMMUNITY): Payer: 59

## 2014-09-26 ENCOUNTER — Encounter (HOSPITAL_COMMUNITY): Payer: Self-pay

## 2014-09-26 DIAGNOSIS — M6289 Other specified disorders of muscle: Secondary | ICD-10-CM

## 2014-09-26 DIAGNOSIS — M25512 Pain in left shoulder: Secondary | ICD-10-CM

## 2014-09-26 DIAGNOSIS — Z4789 Encounter for other orthopedic aftercare: Secondary | ICD-10-CM | POA: Diagnosis not present

## 2014-09-26 DIAGNOSIS — R29898 Other symptoms and signs involving the musculoskeletal system: Secondary | ICD-10-CM

## 2014-09-26 DIAGNOSIS — M25612 Stiffness of left shoulder, not elsewhere classified: Secondary | ICD-10-CM

## 2014-09-26 DIAGNOSIS — M629 Disorder of muscle, unspecified: Secondary | ICD-10-CM

## 2014-09-26 NOTE — Therapy (Signed)
[place], [place], [address] Phone: [phone]   Fax:  [phone]  Occupational Therapy Treatment  Patient Details  Name: Travis [name] MRN: [id number] Date of Birth: 07/06/1960 Referring Provider:  Kathyrn Drown, MD  Encounter Date: 09/26/2014      OT End of Session - 09/26/14 1647    Visit Number 13   Number of Visits 16   Date for OT Re-Evaluation 10/14/14   Authorization Type UMR no visit limit   OT Start Time 1606   OT Stop Time 1650   OT Time Calculation (min) 44 min   Activity Tolerance Patient tolerated treatment well   Behavior During Therapy [facility] for tasks assessed/performed      Past Medical History  Diagnosis Date  . Night sweats     every once in a while  . Hyperlipidemia   . Diabetes mellitus   . Hernia, inguinal   . Dizziness   . Chronic headaches   . Arthritis   . Neuropathy of lower extremity   . Diabetic peripheral neuropathy 03/28/2013  . Diverticulosis   . Hypertension   . Complication of anesthesia     bleed after last shoulder-had to stay overnight  . Impingement syndrome of left shoulder 08/02/2014  . 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 Inguinal hernia repair Bilateral   . Cervical disc surgery    . Wrist surgery Right     fusion  . Shoulder arthroscopy Right     1  . Knee arthroscopy Right   . Colon surgery  2003    1/3 removed for diverticulitis  . Colonoscopy    . Umbilical hernia repair      with other hernia repair with mesh  . Shoulder arthroscopy Left 08/02/2014    Procedure: LEFT SHOULDER SCOPE DEBRIDEMENT/ACROMIOPLASTY;  Surgeon: [name], MD;  Location: [place];  Service: Orthopedics;  Laterality: Left;  ANESTHESIA: GENERAL, PRE/POST OP SCALENE    There were no vitals filed for this visit.  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    Currently in Pain? Yes   Pain Score 5    Pain Location Shoulder   Pain Orientation Left   Pain Descriptors / Indicators Sore   Pain Type Acute pain            [facility] OT Assessment - 09/26/14 1627    Assessment   Diagnosis Left shoulder arthroscopy   Precautions   Precautions Shoulder   Type of Shoulder Precautions PROM through 4/15 then progressing as tolerated                  OT Treatments/Exercises (OP) - 09/26/14 1627    Exercises   Exercises Shoulder   Shoulder Exercises: Supine   Protraction PROM;5 reps;Strengthening;15 reps   Protraction Weight (lbs) 1   Horizontal ABduction PROM;5 reps;Strengthening;15 reps   Horizontal ABduction Weight (lbs) 1   External Rotation PROM;5 reps;Strengthening;15 reps   External Rotation Weight (lbs) 1   Internal Rotation PROM;5 reps;Strengthening;15 reps   Internal Rotation Weight (lbs) 1   Flexion PROM;5 reps;Strengthening;15 reps   Shoulder Flexion Weight (lbs) 1  ABduction PROM;5 reps;Strengthening;15 reps   Shoulder ABduction Weight (lbs) 1   Shoulder Exercises: Standing   Protraction Strengthening;15 reps   Protraction Weight (lbs) 1   Horizontal ABduction Strengthening;15 reps   Horizontal ABduction Weight (lbs) 1   External Rotation Strengthening;15 reps   External Rotation Weight (lbs) 1   Internal Rotation Strengthening;15 reps   Internal Rotation Weight (lbs) 1   Flexion Strengthening;15 reps   Shoulder Flexion Weight (lbs) 1   ABduction Strengthening;15 reps   Shoulder ABduction Weight (lbs) 1   Shoulder Exercises: ROM/Strengthening   UBE (Upper Arm Bike) Level 2 3' forward 3' reverse   Proximal Shoulder Strengthening, Supine 15X with 1#   Manual Therapy   Manual Therapy Myofascial release   Myofascial Release Myofascial release  and muscle energy technique to left anterior deltoid  to relax tone and muscle spasm and improve range of motion.                   OT Short Term Goals - 09/24/14 1601    OT SHORT TERM GOAL #1   Title Patient will be educated on a HEP.   OT SHORT TERM GOAL #2   Title Patient will decrease LUE pain to a 4/10 or less during daily tasks.    Status On-going   OT SHORT TERM GOAL #3   Title Patient will increase PROM to [facility] to increase ability to complete dressing tasks.   OT SHORT TERM GOAL #4   Title Patient will increase LUE strength to 3/5 to increase ability to reach overhead.   OT SHORT TERM GOAL #5   Title Patient will decrease fascial restrctions from mod to min amount.           OT Long Term Goals - 08/21/14 1607    OT LONG TERM GOAL #1   Title Patient will return to highest level of independence with all daily, leisure, and work tasks.    Time 8   Period Weeks   Status On-going   OT LONG TERM GOAL #2   Title Patient will decrease pain to 2/10 or less during daily tasks.    Time 8   Period Weeks   Status On-going   OT LONG TERM GOAL #3   Title Patient will increase AROM of LUE to WNL to increase ability to complete overhead tasks at work.    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    Time 8   Period Weeks   Status On-going   OT LONG TERM GOAL #5   Title Patient will decrease fascial restrictions to trace amount.    Time 8   Period Weeks   Status On-going               Plan - 09/26/14 1647    Clinical Impression Statement A: Increased resistance on UBE bike to level 2. pt tolerated well. DId not add cybex row and press due to time constraint.    Plan P: Add cybex row and press. Attempt 2# handweight supine.         Problem List Patient Active Problem List   Diagnosis Date Noted  . Impingement syndrome of left  shoulder 08/02/2014  . 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 Abdominal pain 06/13/2014  . Rectal bleeding 03/13/2014  .  Diabetes type 2, controlled 02/18/2014  . Hyperlipidemia 02/18/2014  . Diabetic peripheral neuropathy 03/28/2013  . Irreducible incisional hernia 11/11/2010    Ailene Ravel, OTR/L,CBIS  [phone]  09/26/2014, 4:49 PM  [place] [address] [place], [place], [address] Phone: [phone]   Fax:  [phone]

## 2014-10-02 ENCOUNTER — Ambulatory Visit (HOSPITAL_COMMUNITY): Payer: 59 | Attending: Orthopedic Surgery | Admitting: Specialist

## 2014-10-02 DIAGNOSIS — Z4789 Encounter for other orthopedic aftercare: Secondary | ICD-10-CM | POA: Insufficient documentation

## 2014-10-02 DIAGNOSIS — I10 Essential (primary) hypertension: Secondary | ICD-10-CM | POA: Insufficient documentation

## 2014-10-02 DIAGNOSIS — M25512 Pain in left shoulder: Secondary | ICD-10-CM | POA: Insufficient documentation

## 2014-10-02 DIAGNOSIS — Z9889 Other specified postprocedural states: Secondary | ICD-10-CM | POA: Insufficient documentation

## 2014-10-02 DIAGNOSIS — E119 Type 2 diabetes mellitus without complications: Secondary | ICD-10-CM | POA: Insufficient documentation

## 2014-10-02 DIAGNOSIS — M25612 Stiffness of left shoulder, not elsewhere classified: Secondary | ICD-10-CM | POA: Diagnosis not present

## 2014-10-02 DIAGNOSIS — M6289 Other specified disorders of muscle: Secondary | ICD-10-CM

## 2014-10-02 DIAGNOSIS — M629 Disorder of muscle, unspecified: Secondary | ICD-10-CM

## 2014-10-02 DIAGNOSIS — R29898 Other symptoms and signs involving the musculoskeletal system: Secondary | ICD-10-CM

## 2014-10-02 NOTE — Therapy (Signed)
[name], [place], [address] Phone: [phone]   Fax:  [phone]  Occupational Therapy Treatment  Patient Details  Name: Travis [name] MRN: [id number] Date of Birth: 06-05-1960 Referring Provider:  Kathyrn Drown, MD  Encounter Date: 10/02/2014      OT End of Session - 10/02/14 1559    Visit Number 14   Number of Visits 16   Date for OT Re-Evaluation 10/14/14   Authorization Type UMR no visit limit   OT Start Time 1526   OT Stop Time 1606   OT Time Calculation (min) 40 min   Activity Tolerance Patient tolerated treatment well   Behavior During Therapy [facility] for tasks assessed/performed      Past Medical History  Diagnosis Date  . Night sweats     every once in a while  . Hyperlipidemia   . Diabetes mellitus   . Hernia, inguinal   . Dizziness   . Chronic headaches   . Arthritis   . Neuropathy of lower extremity   . Diabetic peripheral neuropathy 03/28/2013  . Diverticulosis   . Hypertension   . Complication of anesthesia     bleed after last shoulder-had to stay overnight  . Impingement syndrome of left shoulder 08/02/2014  . 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 Inguinal hernia repair Bilateral   . Cervical disc surgery    . Wrist surgery Right     fusion  . Shoulder arthroscopy Right     1  . Knee arthroscopy Right   . Colon surgery  2003    1/3 removed for diverticulitis  . Colonoscopy    . Umbilical hernia repair      with other hernia repair with mesh  . Shoulder arthroscopy Left 08/02/2014    Procedure: LEFT SHOULDER SCOPE DEBRIDEMENT/ACROMIOPLASTY;  Surgeon: [name], MD;  Location: [place];  Service: Orthopedics;  Laterality: Left;  ANESTHESIA: GENERAL, PRE/POST OP SCALENE    There were no vitals filed for this visit.  Visit Diagnosis:  Decreased range of motion of shoulder, left  Tight  fascia  Shoulder weakness  Pain in joint, shoulder region, left          [facility] OT Assessment - 10/02/14 0001    Assessment   Diagnosis Left shoulder arthroscopy   Precautions   Precautions Shoulder   Type of Shoulder Precautions PROM through 4/15 then progressing as tolerated                  OT Treatments/Exercises (OP) - 10/02/14 0001    Exercises   Exercises Shoulder   Shoulder Exercises: Supine   Protraction PROM;5 reps;Strengthening;15 reps   Protraction Weight (lbs) 2   Horizontal ABduction PROM;5 reps;Strengthening;15 reps   Horizontal ABduction Weight (lbs) 2   External Rotation PROM;5 reps;Strengthening;15 reps   External Rotation Weight (lbs) 2   Internal Rotation PROM;5 reps;Strengthening;15 reps   Internal Rotation Weight (lbs) 2   Flexion PROM;5 reps;Strengthening;15 reps   Shoulder Flexion Weight (lbs) 2   ABduction PROM;5 reps;Strengthening;15 reps   Shoulder ABduction Weight (lbs) 2   Shoulder Exercises: Prone   Retraction Strengthening;15 reps   Retraction Weight (lbs) 1   Extension Strengthening;10 reps   Extension Weight (lbs) 1   External Rotation Strengthening;10 reps   External Rotation Weight (lbs) 1   Horizontal ABduction 1 Strengthening;10 reps   Horizontal ABduction 1  Weight (lbs) 1   Horizontal ABduction 2 Strengthening;10 reps   Horizontal ABduction 2 Weight (lbs) 1   Shoulder Exercises: ROM/Strengthening   UBE (Upper Arm Bike) Level 2 3' forward 3' reverse   Cybex Press 1.5 plate;15 reps   Cybex Row 1.5 plate;15 reps   Proximal Shoulder Strengthening, Supine 15 X with 2#   Manual Therapy   Manual Therapy Myofascial release   Myofascial Release Myofascial release and muscle energy technique to left anterior deltoid  to relax tone and muscle spasm and improve range of motion.                   OT Short Term Goals - 09/24/14 1601    OT SHORT TERM GOAL #1   Title Patient will be educated on a HEP.   OT SHORT  TERM GOAL #2   Title Patient will decrease LUE pain to a 4/10 or less during daily tasks.    Status On-going   OT SHORT TERM GOAL #3   Title Patient will increase PROM to [facility] to increase ability to complete dressing tasks.   OT SHORT TERM GOAL #4   Title Patient will increase LUE strength to 3/5 to increase ability to reach overhead.   OT SHORT TERM GOAL #5   Title Patient will decrease fascial restrctions from mod to min amount.           OT Long Term Goals - 08/21/14 1607    OT LONG TERM GOAL #1   Title Patient will return to highest level of independence with all daily, leisure, and work tasks.    Time 8   Period Weeks   Status On-going   OT LONG TERM GOAL #2   Title Patient will decrease pain to 2/10 or less during daily tasks.    Time 8   Period Weeks   Status On-going   OT LONG TERM GOAL #3   Title Patient will increase AROM of LUE to WNL to increase ability to complete overhead tasks at work.    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    Time 8   Period Weeks   Status On-going   OT LONG TERM GOAL #5   Title Patient will decrease fascial restrictions to trace amount.    Time 8   Period Weeks   Status On-going               Plan - 10/02/14 1601    Clinical Impression Statement A: Increased to 2# with supine strengthening.  Began prone strengthening this date and added cybext press and row.   Plan P:  Increase to 3# in supine, 2# in seated, focus on overhead endurance activities as he completes these at work.         Problem List Patient Active Problem List   Diagnosis Date Noted  . Impingement syndrome of left shoulder 08/02/2014  . 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 Abdominal pain 06/13/2014  . Rectal bleeding 03/13/2014  . Diabetes type 2, controlled 02/18/2014  . Hyperlipidemia 02/18/2014  . Diabetic  peripheral neuropathy 03/28/2013  . Irreducible incisional hernia 11/11/2010    Vangie Bicker, OTR/L [phone]  10/02/2014, 4:04 PM  [place] [address] [place], [place], [address] Phone: [phone]  Fax:  [phone]

## 2014-10-03 ENCOUNTER — Ambulatory Visit (HOSPITAL_COMMUNITY): Payer: 59

## 2014-10-03 ENCOUNTER — Encounter (HOSPITAL_COMMUNITY): Payer: Self-pay

## 2014-10-03 DIAGNOSIS — R29898 Other symptoms and signs involving the musculoskeletal system: Secondary | ICD-10-CM

## 2014-10-03 DIAGNOSIS — Z4789 Encounter for other orthopedic aftercare: Secondary | ICD-10-CM | POA: Diagnosis not present

## 2014-10-03 DIAGNOSIS — M629 Disorder of muscle, unspecified: Secondary | ICD-10-CM

## 2014-10-03 DIAGNOSIS — M25512 Pain in left shoulder: Secondary | ICD-10-CM

## 2014-10-03 DIAGNOSIS — M6289 Other specified disorders of muscle: Secondary | ICD-10-CM

## 2014-10-03 NOTE — Therapy (Signed)
[place], [place], [address] Phone: [phone]   Fax:  [phone]  Occupational Therapy Treatment  Patient Details  Name: Travis [name] MRN: [id number] Date of Birth: Jul 05, 1960 Referring Provider:  Kathyrn Drown, MD  Encounter Date: 10/03/2014      OT End of Session - 10/03/14 1631    Visit Number 15   Number of Visits 16   Date for OT Re-Evaluation 10/14/14   Authorization Type UMR no visit limit   OT Start Time 1529   OT Stop Time 1609   OT Time Calculation (min) 40 min   Activity Tolerance Patient tolerated treatment well   Behavior During Therapy [facility] for tasks assessed/performed      Past Medical History  Diagnosis Date  . Night sweats     every once in a while  . Hyperlipidemia   . Diabetes mellitus   . Hernia, inguinal   . Dizziness   . Chronic headaches   . Arthritis   . Neuropathy of lower extremity   . Diabetic peripheral neuropathy 03/28/2013  . Diverticulosis   . Hypertension   . Complication of anesthesia     bleed after last shoulder-had to stay overnight  . Impingement syndrome of left shoulder 08/02/2014  . 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 Inguinal hernia repair Bilateral   . Cervical disc surgery    . Wrist surgery Right     fusion  . Shoulder arthroscopy Right     1  . Knee arthroscopy Right   . Colon surgery  2003    1/3 removed for diverticulitis  . Colonoscopy    . Umbilical hernia repair      with other hernia repair with mesh  . Shoulder arthroscopy Left 08/02/2014    Procedure: LEFT SHOULDER SCOPE DEBRIDEMENT/ACROMIOPLASTY;  Surgeon: [name], MD;  Location: [place];  Service: Orthopedics;  Laterality: Left;  ANESTHESIA: GENERAL, PRE/POST OP SCALENE    There were no vitals filed for this visit.  Visit Diagnosis:  Tight fascia  Shoulder weakness  Pain in joint,  shoulder region, left      Subjective Assessment - 10/03/14 1630    Subjective  S: I am a little sore today.    Currently in Pain? Yes   Pain Score 4    Pain Location Shoulder   Pain Orientation Left   Pain Descriptors / Indicators Sore   Pain Type Acute pain            [facility] OT Assessment - 10/03/14 1551    Assessment   Diagnosis Left shoulder arthroscopy   Precautions   Precautions Shoulder   Type of Shoulder Precautions PROM through 4/15 then progressing as tolerated                  OT Treatments/Exercises (OP) - 10/03/14 1551    Exercises   Exercises Shoulder   Shoulder Exercises: Prone   Retraction Strengthening;15 reps   Retraction Weight (lbs) 1   Extension Strengthening;15 reps   Extension Weight (lbs) 1   External Rotation Strengthening;15 reps   External Rotation Weight (lbs) 1   Horizontal ABduction 1 Strengthening;15 reps   Horizontal ABduction 1 Weight (lbs) 1   Horizontal ABduction 2 Strengthening;15 reps   Horizontal ABduction 2 Weight (lbs) 1   Shoulder Exercises: ROM/Strengthening   UBE (Upper Arm Bike) Level 2 3' forward  3' reverse   Over Head Lace 2:30 2# wrist weight   Proximal Shoulder Strengthening, Seated 15X with 2# no rest breaks   Manual Therapy   Manual Therapy Myofascial release   Myofascial Release Myofascial release and muscle energy technique to left anterior deltoid  to relax tone and muscle spasm and improve range of motion.                   OT Short Term Goals - 09/24/14 1601    OT SHORT TERM GOAL #1   Title Patient will be educated on a HEP.   OT SHORT TERM GOAL #2   Title Patient will decrease LUE pain to a 4/10 or less during daily tasks.    Status On-going   OT SHORT TERM GOAL #3   Title Patient will increase PROM to [facility] to increase ability to complete dressing tasks.   OT SHORT TERM GOAL #4   Title Patient will increase LUE strength to 3/5 to increase ability to reach overhead.   OT SHORT TERM  GOAL #5   Title Patient will decrease fascial restrctions from mod to min amount.           OT Long Term Goals - 08/21/14 1607    OT LONG TERM GOAL #1   Title Patient will return to highest level of independence with all daily, leisure, and work tasks.    Time 8   Period Weeks   Status On-going   OT LONG TERM GOAL #2   Title Patient will decrease pain to 2/10 or less during daily tasks.    Time 8   Period Weeks   Status On-going   OT LONG TERM GOAL #3   Title Patient will increase AROM of LUE to WNL to increase ability to complete overhead tasks at work.    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    Time 8   Period Weeks   Status On-going   OT LONG TERM GOAL #5   Title Patient will decrease fascial restrictions to trace amount.    Time 8   Period Weeks   Status On-going               Plan - 10/03/14 1635    Clinical Impression Statement A: Due to time constraint was unable to complete all exercises. Added overhead lacing with 2# wrist weights.    Plan P: Increase to 3# in supine, 2# in standing, and continue to focus on overhead endurance actvities in relation to work.         Problem List Patient Active Problem List   Diagnosis Date Noted  . Impingement syndrome of left shoulder 08/02/2014  . 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 Abdominal pain 06/13/2014  . Rectal bleeding 03/13/2014  . Diabetes type 2, controlled 02/18/2014  . Hyperlipidemia 02/18/2014  . Diabetic peripheral neuropathy 03/28/2013  . Irreducible incisional hernia 11/11/2010    Ailene Ravel, OTR/L,CBIS  [phone]  10/03/2014, 5:31 PM  [place], [place], [address] Phone: [phone]   Fax:  [phone]

## 2014-10-08 ENCOUNTER — Ambulatory Visit (HOSPITAL_COMMUNITY): Payer: 59

## 2014-10-08 ENCOUNTER — Encounter (HOSPITAL_COMMUNITY): Payer: Self-pay

## 2014-10-08 DIAGNOSIS — M25612 Stiffness of left shoulder, not elsewhere classified: Secondary | ICD-10-CM

## 2014-10-08 DIAGNOSIS — R29898 Other symptoms and signs involving the musculoskeletal system: Secondary | ICD-10-CM

## 2014-10-08 DIAGNOSIS — M25512 Pain in left shoulder: Secondary | ICD-10-CM

## 2014-10-08 DIAGNOSIS — Z4789 Encounter for other orthopedic aftercare: Secondary | ICD-10-CM | POA: Diagnosis not present

## 2014-10-08 NOTE — Therapy (Signed)
[place], [place], [address] Phone: [phone]   Fax:  [phone]  Occupational Therapy Treatment  Patient Details  Name: Travis [name] MRN: [id number] Date of Birth: [birth year] Referring Provider:  [name], MD  Encounter Date: 10/08/2014      OT End of Session - 10/08/14 1631    Visit Number 16   Number of Visits 24   Date for OT Re-Evaluation 10/14/14   Authorization Type UMR no visit limit   OT Start Time 1523   OT Stop Time 1603   OT Time Calculation (min) 40 min   Activity Tolerance Patient tolerated treatment well   Behavior During Therapy [facility] for tasks assessed/performed      Past Medical History  Diagnosis Date  . Night sweats     every once in a while  . Hyperlipidemia   . Diabetes mellitus   . Hernia, inguinal   . Dizziness   . Chronic headaches   . Arthritis   . Neuropathy of lower extremity   . Diabetic peripheral neuropathy 03/28/2013  . Diverticulosis   . Hypertension   . Complication of anesthesia     bleed after last shoulder-had to stay overnight  . Impingement syndrome of left shoulder 08/02/2014  . 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 Inguinal hernia repair Bilateral   . Cervical disc surgery    . Wrist surgery Right     fusion  . Shoulder arthroscopy Right     1  . Knee arthroscopy Right   . Colon surgery  2003    1/3 removed for diverticulitis  . Colonoscopy    . Umbilical hernia repair      with other hernia repair with mesh  . Shoulder arthroscopy Left 08/02/2014    Procedure: LEFT SHOULDER SCOPE DEBRIDEMENT/ACROMIOPLASTY;  Surgeon: [name], MD;  Location: [facility];  Service: Orthopedics;  Laterality: Left;  ANESTHESIA: GENERAL, PRE/POST OP SCALENE    There were no vitals filed for this visit.  Visit Diagnosis:  Shoulder weakness  Pain in joint, shoulder region,  left  Decreased range of motion of shoulder, left      Subjective Assessment - 10/08/14 1533    Subjective  S: Saturday wore me out.    Currently in Pain? Yes   Pain Score 3    Pain Location Shoulder   Pain Orientation Left   Pain Descriptors / Indicators Sore   Pain Type Acute pain            [facility] OT Assessment - 10/08/14 1535    Assessment   Diagnosis Left shoulder arthroscopy   Precautions   Precautions Shoulder   Type of Shoulder Precautions PROM through 4/15 then progressing as tolerated                  OT Treatments/Exercises (OP) - 10/08/14 1535    Exercises   Exercises Shoulder   Shoulder Exercises: Supine   Protraction PROM;5 reps;Strengthening;15 reps   Protraction Weight (lbs) 3   Horizontal ABduction PROM;5 reps;Strengthening;15 reps   Horizontal ABduction Weight (lbs) 3   External Rotation PROM;5 reps;Strengthening;15 reps   External Rotation Weight (lbs) 3   Internal Rotation PROM;5 reps;Strengthening;15 reps   Internal Rotation Weight (lbs) 3   Flexion PROM;5 reps;Strengthening;15 reps   Shoulder Flexion Weight (lbs) 3   ABduction PROM;5 reps;Strengthening;15 reps   Shoulder ABduction Weight (  lbs) 3   Shoulder Exercises: Prone   Retraction Strengthening;15 reps   Retraction Weight (lbs) 3   Extension Strengthening;15 reps   Extension Weight (lbs) 3   External Rotation Strengthening;15 reps   External Rotation Weight (lbs) 3   Horizontal ABduction 1 Strengthening;15 reps   Horizontal ABduction 1 Weight (lbs) 3   Horizontal ABduction 2 Strengthening;15 reps   Horizontal ABduction 2 Weight (lbs) 3   Other Prone Exercises [place] exercises 2# 12X all positions   Shoulder Exercises: ROM/Strengthening   UBE (Upper Arm Bike) Level 2 3' forward 3' reverse   Pushups 10 reps   Pushups Limitations incline with elbow adducted   Proximal Shoulder Strengthening, Supine 15X with 2# rest breaks between                 OT Education -  10/08/14 1552    Education provided Yes   Education Details [place] exercises   Person(s) Educated Patient   Methods Explanation;Demonstration;Handout   Comprehension Returned demonstration;Verbalized understanding          OT Short Term Goals - 09/24/14 1601    OT SHORT TERM GOAL #1   Title Patient will be educated on a HEP.   OT SHORT TERM GOAL #2   Title Patient will decrease LUE pain to a 4/10 or less during daily tasks.    Status On-going   OT SHORT TERM GOAL #3   Title Patient will increase PROM to [facility] to increase ability to complete dressing tasks.   OT SHORT TERM GOAL #4   Title Patient will increase LUE strength to 3/5 to increase ability to reach overhead.   OT SHORT TERM GOAL #5   Title Patient will decrease fascial restrctions from mod to min amount.           OT Long Term Goals - 08/21/14 1607    OT LONG TERM GOAL #1   Title Patient will return to highest level of independence with all daily, leisure, and work tasks.    Time 8   Period Weeks   Status On-going   OT LONG TERM GOAL #2   Title Patient will decrease pain to 2/10 or less during daily tasks.    Time 8   Period Weeks   Status On-going   OT LONG TERM GOAL #3   Title Patient will increase AROM of LUE to WNL to increase ability to complete overhead tasks at work.    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    Time 8   Period Weeks   Status On-going   OT LONG TERM GOAL #5   Title Patient will decrease fascial restrictions to trace amount.    Time 8   Period Weeks   Status On-going               Plan - 10/08/14 1632    Clinical Impression Statement A: Pt arrived late to session. Mtofascial release not completed due to late start. Increased supine to 3#. Patient tolerated well.    Plan P: Increase standing to 2# and continue to focus on overhead endurance  actvities related to work.         Problem List Patient Active Problem List   Diagnosis Date Noted  . Impingement syndrome of left shoulder 08/02/2014  . Adhesive capsulitis  of left shoulder 08/02/2014  . Abdominal pain 06/13/2014  . Rectal bleeding 03/13/2014  . Diabetes type 2, controlled 02/18/2014  . Hyperlipidemia 02/18/2014  . Diabetic peripheral neuropathy 03/28/2013  . Irreducible incisional hernia 11/11/2010    ,Ailene Ravel, OTR/L,CBIS  [phone]  10/08/2014, 4:35 PM  [facility] [address] [place], [place], [address] Phone: [phone]   Fax:  [phone]

## 2014-10-10 ENCOUNTER — Ambulatory Visit (HOSPITAL_COMMUNITY): Payer: 59

## 2014-10-10 DIAGNOSIS — Z4789 Encounter for other orthopedic aftercare: Secondary | ICD-10-CM | POA: Diagnosis not present

## 2014-10-10 DIAGNOSIS — M629 Disorder of muscle, unspecified: Secondary | ICD-10-CM

## 2014-10-10 DIAGNOSIS — M6289 Other specified disorders of muscle: Secondary | ICD-10-CM

## 2014-10-10 DIAGNOSIS — R29898 Other symptoms and signs involving the musculoskeletal system: Secondary | ICD-10-CM

## 2014-10-10 DIAGNOSIS — M25612 Stiffness of left shoulder, not elsewhere classified: Secondary | ICD-10-CM

## 2014-10-10 NOTE — Therapy (Signed)
[place], [place], [address] Phone: [phone]   Fax:  [phone]  Occupational Therapy Treatment  Patient Details  Name: Travis [name] MRN: [id number] Date of Birth: 04/22/1961 Referring Provider:  Kathyrn Drown, MD  Encounter Date: 10/10/2014      OT End of Session - 10/10/14 1626    Visit Number 17   Number of Visits 24   Date for OT Re-Evaluation 10/14/14   Authorization Type UMR no visit limit   OT Start Time 1525   OT Stop Time 1605   OT Time Calculation (min) 40 min   Activity Tolerance Patient tolerated treatment well   Behavior During Therapy [facility] for tasks assessed/performed      Past Medical History  Diagnosis Date  . Night sweats     every once in a while  . Hyperlipidemia   . Diabetes mellitus   . Hernia, inguinal   . Dizziness   . Chronic headaches   . Arthritis   . Neuropathy of lower extremity   . Diabetic peripheral neuropathy 03/28/2013  . Diverticulosis   . Hypertension   . Complication of anesthesia     bleed after last shoulder-had to stay overnight  . Impingement syndrome of left shoulder 08/02/2014  . 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 Inguinal hernia repair Bilateral   . Cervical disc surgery    . Wrist surgery Right     fusion  . Shoulder arthroscopy Right     1  . Knee arthroscopy Right   . Colon surgery  2003    1/3 removed for diverticulitis  . Colonoscopy    . Umbilical hernia repair      with other hernia repair with mesh  . Shoulder arthroscopy Left 08/02/2014    Procedure: LEFT SHOULDER SCOPE DEBRIDEMENT/ACROMIOPLASTY;  Surgeon: [name], MD;  Location: [facility];  Service: Orthopedics;  Laterality: Left;  ANESTHESIA: GENERAL, PRE/POST OP SCALENE    There were no vitals filed for this visit.  Visit Diagnosis:  Shoulder weakness  Tight fascia  Shoulder stiffness,  left        OT Treatments/Exercises (OP) - 10/10/14 1546    Exercises   Exercises Shoulder   Shoulder Exercises: Supine   Protraction PROM;5 reps;Strengthening;15 reps   Protraction Weight (lbs) 3   Horizontal ABduction PROM;5 reps;Strengthening;15 reps   Horizontal ABduction Weight (lbs) 3   External Rotation PROM;5 reps;Strengthening;15 reps   External Rotation Weight (lbs) 3   Internal Rotation PROM;5 reps;Strengthening;15 reps   Internal Rotation Weight (lbs) 3   Flexion PROM;5 reps;Strengthening;15 reps   Shoulder Flexion Weight (lbs) 3   ABduction PROM;5 reps;Strengthening;15 reps   Shoulder ABduction Weight (lbs) 3   Other Supine Exercises Shoulder circles 5X forward 5x reverse; 3#   Shoulder Exercises: Prone   Retraction Strengthening;15 reps   Retraction Weight (lbs) 3   Flexion Strengthening;15 reps   Flexion Weight (lbs) 3   Extension Strengthening;15 reps   Extension Weight (lbs) 3   External Rotation Strengthening;15 reps   External Rotation Weight (lbs) 3   Horizontal ABduction 1 Strengthening;15 reps   Horizontal ABduction 1 Weight (lbs) 3   Horizontal ABduction 2 Strengthening;15 reps   Horizontal ABduction 2 Weight (lbs) 3   Other Prone Exercises Shoulder press; shoulder abduction; 3#; 15X   Shoulder Exercises: ROM/Strengthening   UBE (Upper Arm Bike) Level 2 3'  forward 3' reverse   Proximal Shoulder Strengthening, Supine 15X with 2# rest breaks between                   OT Short Term Goals - 09/24/14 1601    OT SHORT TERM GOAL #1   Title Patient will be educated on a HEP.   OT SHORT TERM GOAL #2   Title Patient will decrease LUE pain to a 4/10 or less during daily tasks.    Status On-going   OT SHORT TERM GOAL #3   Title Patient will increase PROM to [facility] to increase ability to complete dressing tasks.   OT SHORT TERM GOAL #4   Title Patient will increase LUE strength to 3/5 to increase ability to reach overhead.   OT SHORT TERM GOAL #5    Title Patient will decrease fascial restrctions from mod to min amount.           OT Long Term Goals - 08/21/14 1607    OT LONG TERM GOAL #1   Title Patient will return to highest level of independence with all daily, leisure, and work tasks.    Time 8   Period Weeks   Status On-going   OT LONG TERM GOAL #2   Title Patient will decrease pain to 2/10 or less during daily tasks.    Time 8   Period Weeks   Status On-going   OT LONG TERM GOAL #3   Title Patient will increase AROM of LUE to WNL to increase ability to complete overhead tasks at work.    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    Time 8   Period Weeks   Status On-going   OT LONG TERM GOAL #5   Title Patient will decrease fascial restrictions to trace amount.    Time 8   Period Weeks   Status On-going               Plan - 10/10/14 1627    Clinical Impression Statement A: Added several prone exercises to increase scapular stability. Pt reports that once he can complete tricep dips he will feel ready to be done with therapy.    Plan P: Reassess. Add overhead endurance exercises. Add power tower. Complete MFR PRN.         Problem List Patient Active Problem List   Diagnosis Date Noted  . Impingement syndrome of left shoulder 08/02/2014  . 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 Abdominal pain 06/13/2014  . Rectal bleeding 03/13/2014  . Diabetes type 2, controlled 02/18/2014  . Hyperlipidemia 02/18/2014  . Diabetic peripheral neuropathy 03/28/2013  . Irreducible incisional hernia 11/11/2010    Ailene Ravel, OTR/L,CBIS  [phone]  10/10/2014, 4:30 PM  [place] [address] [place], [place], [address] Phone: [phone]   Fax:  [phone]

## 2014-10-14 ENCOUNTER — Emergency Department (HOSPITAL_COMMUNITY): Payer: 59

## 2014-10-14 ENCOUNTER — Encounter (HOSPITAL_COMMUNITY): Payer: Self-pay | Admitting: *Deleted

## 2014-10-14 ENCOUNTER — Inpatient Hospital Stay (HOSPITAL_COMMUNITY)
Admission: EM | Admit: 2014-10-14 | Discharge: 2014-10-16 | DRG: 101 | Disposition: A | Discharge status: Home / Self Care | Payer: 59 | Attending: Internal Medicine | Admitting: Internal Medicine

## 2014-10-14 DIAGNOSIS — E785 Hyperlipidemia, unspecified: Secondary | ICD-10-CM | POA: Diagnosis not present

## 2014-10-14 DIAGNOSIS — G40209 Localization-related (focal) (partial) symptomatic epilepsy and epileptic syndromes with complex partial seizures, not intractable, without status epilepticus: Secondary | ICD-10-CM | POA: Diagnosis present

## 2014-10-14 DIAGNOSIS — Z833 Family history of diabetes mellitus: Secondary | ICD-10-CM

## 2014-10-14 DIAGNOSIS — Z7982 Long term (current) use of aspirin: Secondary | ICD-10-CM

## 2014-10-14 DIAGNOSIS — I370 Nonrheumatic pulmonary valve stenosis: Secondary | ICD-10-CM | POA: Diagnosis not present

## 2014-10-14 DIAGNOSIS — I10 Essential (primary) hypertension: Secondary | ICD-10-CM | POA: Diagnosis not present

## 2014-10-14 DIAGNOSIS — Z8673 Personal history of transient ischemic attack (TIA), and cerebral infarction without residual deficits: Secondary | ICD-10-CM

## 2014-10-14 DIAGNOSIS — I639 Cerebral infarction, unspecified: Secondary | ICD-10-CM | POA: Diagnosis not present

## 2014-10-14 DIAGNOSIS — E119 Type 2 diabetes mellitus without complications: Secondary | ICD-10-CM | POA: Diagnosis not present

## 2014-10-14 DIAGNOSIS — Z794 Long term (current) use of insulin: Secondary | ICD-10-CM

## 2014-10-14 DIAGNOSIS — Z823 Family history of stroke: Secondary | ICD-10-CM

## 2014-10-14 DIAGNOSIS — F0150 Vascular dementia without behavioral disturbance: Secondary | ICD-10-CM | POA: Diagnosis present

## 2014-10-14 DIAGNOSIS — Z87891 Personal history of nicotine dependence: Secondary | ICD-10-CM

## 2014-10-14 DIAGNOSIS — I6930 Unspecified sequelae of cerebral infarction: Secondary | ICD-10-CM

## 2014-10-14 DIAGNOSIS — E1142 Type 2 diabetes mellitus with diabetic polyneuropathy: Secondary | ICD-10-CM | POA: Diagnosis present

## 2014-10-14 DIAGNOSIS — R42 Dizziness and giddiness: Secondary | ICD-10-CM

## 2014-10-14 DIAGNOSIS — M1990 Unspecified osteoarthritis, unspecified site: Secondary | ICD-10-CM | POA: Diagnosis present

## 2014-10-14 DIAGNOSIS — Z8249 Family history of ischemic heart disease and other diseases of the circulatory system: Secondary | ICD-10-CM

## 2014-10-14 HISTORY — DX: Unspecified sequelae of cerebral infarction: I69.30

## 2014-10-14 LAB — CBC WITH DIFFERENTIAL/PLATELET
Basophils Absolute: 0 10*3/uL (ref 0.0–0.1)
Basophils Relative: 0 % (ref 0–1)
Eosinophils Absolute: 0.3 10*3/uL (ref 0.0–0.7)
Eosinophils Relative: 4 % (ref 0–5)
HCT: 41 % (ref 39.0–52.0)
Hemoglobin: 14.4 g/dL (ref 13.0–17.0)
Lymphocytes Relative: 23 % (ref 12–46)
Lymphs Abs: 1.7 10*3/uL (ref 0.7–4.0)
MCH: 32.8 pg (ref 26.0–34.0)
MCHC: 35.1 g/dL (ref 30.0–36.0)
MCV: 93.4 fL (ref 78.0–100.0)
MONO ABS: 0.8 10*3/uL (ref 0.1–1.0)
MONOS PCT: 11 % (ref 3–12)
NEUTROS ABS: 4.5 10*3/uL (ref 1.7–7.7)
NEUTROS PCT: 62 % (ref 43–77)
Platelets: 170 10*3/uL (ref 150–400)
RBC: 4.39 MIL/uL (ref 4.22–5.81)
RDW: 13.1 % (ref 11.5–15.5)
WBC: 7.3 10*3/uL (ref 4.0–10.5)

## 2014-10-14 LAB — COMPREHENSIVE METABOLIC PANEL
ALBUMIN: 4.2 g/dL (ref 3.5–5.0)
ALK PHOS: 59 U/L (ref 38–126)
ALT: 15 U/L — AB (ref 17–63)
AST: 20 U/L (ref 15–41)
Anion gap: 6 (ref 5–15)
BUN: 18 mg/dL (ref 6–20)
CALCIUM: 8.9 mg/dL (ref 8.9–10.3)
CO2: 24 mmol/L (ref 22–32)
Chloride: 112 mmol/L — ABNORMAL HIGH (ref 101–111)
Creatinine, Ser: 0.88 mg/dL (ref 0.61–1.24)
GFR calc Af Amer: >60 mL/min (ref >60)
GFR calc non Af Amer: >60 mL/min (ref >60)
Glucose, Bld: 147 mg/dL — ABNORMAL HIGH (ref 65–99)
POTASSIUM: 4.1 mmol/L (ref 3.5–5.1)
SODIUM: 142 mmol/L (ref 135–145)
TOTAL PROTEIN: 6.8 g/dL (ref 6.5–8.1)
Total Bilirubin: 0.6 mg/dL (ref 0.3–1.2)

## 2014-10-14 LAB — URINALYSIS, ROUTINE W REFLEX MICROSCOPIC
Bilirubin Urine: NEGATIVE
GLUCOSE, UA: 100 mg/dL — AB
Hgb urine dipstick: NEGATIVE
Ketones, ur: NEGATIVE mg/dL
LEUKOCYTES UA: NEGATIVE
NITRITE: NEGATIVE
PH: 5.5 (ref 5.0–8.0)
Protein, ur: NEGATIVE mg/dL
UROBILINOGEN UA: 0.2 mg/dL (ref 0.0–1.0)

## 2014-10-14 LAB — GLUCOSE, CAPILLARY: Glucose-Capillary: 86 mg/dL (ref 65–99)

## 2014-10-14 LAB — CBG MONITORING, ED: Glucose-Capillary: 130 mg/dL — ABNORMAL HIGH (ref 65–99)

## 2014-10-14 MED ORDER — GLYBURIDE MICRONIZED 3 MG PO TABS
6.0000 mg | ORAL_TABLET | Freq: Two times a day (BID) | ORAL | Status: DC
  Administered 2014-10-15 – 2014-10-16 (×4): 6 mg via ORAL
  Filled 2014-10-14 (×7): qty 2

## 2014-10-14 MED ORDER — PRAVASTATIN SODIUM 10 MG PO TABS
20.0000 mg | ORAL_TABLET | Freq: Every day | ORAL | Status: DC
  Administered 2014-10-15 – 2014-10-16 (×2): 20 mg via ORAL
  Filled 2014-10-14 (×2): qty 2

## 2014-10-14 MED ORDER — LORAZEPAM 2 MG/ML IJ SOLN
1.0000 mg | Freq: Once | INTRAMUSCULAR | Status: AC
  Administered 2014-10-14: 1 mg via INTRAVENOUS
  Filled 2014-10-14: qty 1

## 2014-10-14 MED ORDER — ASPIRIN 300 MG RE SUPP
300.0000 mg | Freq: Every day | RECTAL | Status: DC
  Filled 2014-10-14 (×4): qty 1

## 2014-10-14 MED ORDER — CHOLECALCIFEROL 10 MCG (400 UNIT) PO TABS
400.0000 [IU] | ORAL_TABLET | Freq: Every day | ORAL | Status: DC
  Administered 2014-10-15 – 2014-10-16 (×2): 400 [IU] via ORAL
  Filled 2014-10-14 (×2): qty 1

## 2014-10-14 MED ORDER — STROKE: EARLY STAGES OF RECOVERY BOOK
Freq: Once | Status: AC
  Administered 2014-10-15: 15:00:00
  Filled 2014-10-14: qty 1

## 2014-10-14 MED ORDER — PREGABALIN 75 MG PO CAPS
75.0000 mg | ORAL_CAPSULE | Freq: Three times a day (TID) | ORAL | Status: DC
  Administered 2014-10-14 – 2014-10-16 (×6): 75 mg via ORAL
  Filled 2014-10-14 (×6): qty 1

## 2014-10-14 MED ORDER — LISINOPRIL 5 MG PO TABS
2.5000 mg | ORAL_TABLET | Freq: Every morning | ORAL | Status: DC
  Administered 2014-10-15 – 2014-10-16 (×2): 2.5 mg via ORAL
  Filled 2014-10-14 (×2): qty 1

## 2014-10-14 MED ORDER — SODIUM CHLORIDE 0.9 % IV SOLN
INTRAVENOUS | Status: DC
  Administered 2014-10-14: 1000 mL via INTRAVENOUS

## 2014-10-14 MED ORDER — SODIUM CHLORIDE 0.9 % IV SOLN
INTRAVENOUS | Status: DC
  Administered 2014-10-14 – 2014-10-16 (×3): via INTRAVENOUS

## 2014-10-14 MED ORDER — HEPARIN SODIUM (PORCINE) 5000 UNIT/ML IJ SOLN
5000.0000 [IU] | Freq: Three times a day (TID) | INTRAMUSCULAR | Status: DC
  Administered 2014-10-14 – 2014-10-16 (×6): 5000 [IU] via SUBCUTANEOUS
  Filled 2014-10-14 (×6): qty 1

## 2014-10-14 MED ORDER — INSULIN ASPART 100 UNIT/ML ~~LOC~~ SOLN: 0.0000 [IU] | Freq: Every day | SUBCUTANEOUS | Status: DC

## 2014-10-14 MED ORDER — METFORMIN HCL 500 MG PO TABS
500.0000 mg | ORAL_TABLET | Freq: Two times a day (BID) | ORAL | Status: DC
  Administered 2014-10-15 – 2014-10-16 (×4): 500 mg via ORAL
  Filled 2014-10-14 (×4): qty 1

## 2014-10-14 MED ORDER — ASPIRIN 81 MG PO TABS: 81.0000 mg | ORAL_TABLET | Freq: Every day | ORAL | Status: DC

## 2014-10-14 MED ORDER — ASPIRIN 325 MG PO TABS
325.0000 mg | ORAL_TABLET | Freq: Every day | ORAL | Status: DC
  Administered 2014-10-14 – 2014-10-16 (×3): 325 mg via ORAL
  Filled 2014-10-14 (×3): qty 1

## 2014-10-14 MED ORDER — INSULIN ASPART 100 UNIT/ML ~~LOC~~ SOLN
0.0000 [IU] | Freq: Three times a day (TID) | SUBCUTANEOUS | Status: DC
  Administered 2014-10-15: 2 [IU] via SUBCUTANEOUS
  Administered 2014-10-15 – 2014-10-16 (×2): 3 [IU] via SUBCUTANEOUS
  Administered 2014-10-16: 2 [IU] via SUBCUTANEOUS

## 2014-10-14 NOTE — ED Notes (Signed)
Attempted to wake pt who is sleeping soundly.  Pt very drowsy.  Family at bedside and state that they will continue to try and wake pt.

## 2014-10-14 NOTE — H&P (Signed)
[facility] History and Physical  Travis [name] MWU:[id number] DOB: 1961/02/12 DOA: 10/14/2014  Referring physician: ER, Dr. Leonides Schanz PCP: [name], MD   Chief Complaint: Dizziness  HPI: Travis [name] is a 54 y.o. male  This is a 54 year old man who has a history of diabetes, hypertension, hyperlipidemia who presents with an acute onset of dizziness approximately 1 PM today. He found it difficulty with walking and tended to veer over to the left side. He also had diplopia today and his speech has been slurred. He tells me that he has had 3-4 such episodes in the last 2-3 years. He denies any chest pain, palpitations, dyspnea, nausea or vomiting. There is no abdominal pain or fever. There is no cough. His primary care physician send him for an MRI scan in September 2015 which showed multiple small chronic infarcts in the posterior cerebellar hemispheres. There are also chronic lacunar infarct in the left basal ganglia. There was also a small chronic cortically based infarct in the anterior right frontal lobe. Evaluation today in the emergency room shows chronic infarcts again throughout both cerebellar hemispheres, left basal ganglia and right frontal lobe. His wife also describes forgetfulness in the last few months of short-term events. He is now being admitted for further investigation.   Review of Systems:  Apart from symptoms above, all systems negative.  Past Medical History  Diagnosis Date  . Night sweats     every once in a while  . Hyperlipidemia   . Diabetes mellitus   . Hernia, inguinal   . Dizziness   . Chronic headaches   . Arthritis   . Neuropathy of lower extremity   . Diabetic peripheral neuropathy 03/28/2013  . Diverticulosis   . Hypertension   . Complication of anesthesia     bleed after last shoulder-had to stay overnight  . Impingement syndrome of left shoulder 08/02/2014  . 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 Multi-infarct state 10/14/2014     Past Surgical History  Procedure Laterality Date  . Inguinal hernia repair Bilateral   . Cervical disc surgery    . Wrist surgery Right     fusion  . Shoulder arthroscopy Right     1  . Knee arthroscopy Right   . Colon surgery  2003    1/3 removed for diverticulitis  . Colonoscopy    . Umbilical hernia repair      with other hernia repair with mesh  . Shoulder arthroscopy Left 08/02/2014    Procedure: LEFT SHOULDER SCOPE DEBRIDEMENT/ACROMIOPLASTY;  Surgeon: [name], MD;  Location: [facility];  Service: Orthopedics;  Laterality: Left;  ANESTHESIA: GENERAL, PRE/POST OP SCALENE   Social History:  reports that he quit smoking about 36 years ago. His smoking use included Cigarettes. He started smoking about 44 years ago. He has a 8 pack-year smoking history. He quit smokeless tobacco use about 36 years ago. His smokeless tobacco use included Chew. He reports that he drinks about 0.6 oz of alcohol per week. He reports that he does not use illicit drugs.  No Known Allergies  Family History  Problem Relation Age of Onset  . Stroke Father   . Hyperlipidemia Father   . Diabetes Maternal Grandfather   . Heart attack Sister 65  . ALS Brother 43    Prior to Admission medications   Medication Sig Start Date End Date Taking? Authorizing Provider  aspirin 81 MG tablet Take 81 mg by mouth daily.  Yes Historical Provider, MD  baclofen (LIORESAL) 10 MG tablet Take 1 tablet (10 mg total) by mouth 3 (three) times daily. As needed for muscle spasm 08/02/14  Yes [name], MD  Cholecalciferol (VITAMIN D) 400 UNITS capsule Take 400 Units by mouth daily.     Yes Historical Provider, MD  Cyanocobalamin (VITAMIN B12 PO) Take by mouth daily.     Yes Historical Provider, MD  glyBURIDE micronized (GLYNASE) 6 MG tablet Take 1 tablet (6 mg total) by mouth 2 (two) times daily. 06/13/14  Yes Kathyrn Drown, MD  lisinopril (PRINIVIL,ZESTRIL) 2.5 MG tablet Take 1 tablet (2.5 mg total) by  mouth every morning. 06/13/14  Yes Kathyrn Drown, MD  meloxicam (MOBIC) 15 MG tablet Take 1 tablet (15 mg total) by mouth daily. 06/13/14  Yes Kathyrn Drown, MD  metFORMIN (GLUCOPHAGE) 500 MG tablet Take 1 tablet (500 mg total) by mouth 2 (two) times daily with a meal. 06/13/14  Yes Kathyrn Drown, MD  oxyCODONE-acetaminophen (PERCOCET) 10-325 MG per tablet Take 1-2 tablets by mouth every 6 (six) hours as needed for pain. MAXIMUM TOTAL ACETAMINOPHEN DOSE IS 4000 MG PER DAY 08/02/14  Yes [name], MD  pravastatin (PRAVACHOL) 20 MG tablet Take 1 tablet (20 mg total) by mouth every morning. 06/13/14  Yes Kathyrn Drown, MD  pregabalin (LYRICA) 100 MG capsule Take 1 capsule (100 mg total) by mouth 3 (three) times daily. 06/13/14  Yes Kathyrn Drown, MD  LORazepam (ATIVAN) 1 MG tablet Take one tablet one hour before procedure Patient not taking: Reported on 10/14/2014 12/31/13   Kathyrn Drown, MD  ondansetron (ZOFRAN) 4 MG tablet Take 1 tablet (4 mg total) by mouth every 8 (eight) hours as needed for nausea or vomiting. Patient not taking: Reported on 10/14/2014 08/02/14   [name], MD  sennosides-docusate sodium (SENOKOT-S) 8.6-50 MG tablet Take 2 tablets by mouth daily. Patient not taking: Reported on 10/14/2014 08/02/14   [name], MD  valACYclovir (VALTREX) 1000 MG tablet Take 1 tablet (1,000 mg total) by mouth daily. Patient not taking: Reported on 10/14/2014 09/11/14   Kathyrn Drown, MD   Physical Exam: Filed Vitals:   10/14/14 1805 10/14/14 1830 10/14/14 1900 10/14/14 1928  BP: 153/89 125/96 128/97 128/97  Pulse: 64 71 71 71  Temp:      TempSrc:      Resp: 16 17 16 18   Height:      Weight:      SpO2: 99% 97% 96% 98%    Wt Readings from Last 3 Encounters:  10/14/14 83.915 kg (185 lb)  09/11/14 86.818 kg (191 lb 6.4 oz)  08/02/14 82.101 kg (181 lb)    He appears somewhat drowsy. However, he is alert and orientated in time, place and person. He does have dysarthria. He also  demonstrates diplopia. He demonstrates cerebellar signs with past pointing and evidence of dysdiadochokinesis. There is no clear loss of power in any limb. Heart sounds are present without murmurs or added sounds. There are no carotid bruits. Lung fields are clear. Abdomen is soft and nontender without any evidence of hepatosplenomegaly. Psychiatric: Affect is appropriate. Skin: No worrisome skin lesions. Musculoskeletal: No abnormalities acutely.           Labs on Admission:  Basic Metabolic Panel:  Recent Labs Lab 10/14/14 1402  NA 142  K 4.1  CL 112*  CO2 24  GLUCOSE 147*  BUN 18  CREATININE 0.88  CALCIUM 8.9  Liver Function Tests:  Recent Labs Lab 10/14/14 1402  AST 20  ALT 15*  ALKPHOS 59  BILITOT 0.6  PROT 6.8  ALBUMIN 4.2   No results for input(s): LIPASE, AMYLASE in the last 168 hours. No results for input(s): AMMONIA in the last 168 hours. CBC:  Recent Labs Lab 10/14/14 1402  WBC 7.3  NEUTROABS 4.5  HGB 14.4  HCT 41.0  MCV 93.4  PLT 170   Cardiac Enzymes: No results for input(s): CKTOTAL, CKMB, CKMBINDEX, TROPONINI in the last 168 hours.  BNP (last 3 results) No results for input(s): BNP in the last 8760 hours.  ProBNP (last 3 results) No results for input(s): PROBNP in the last 8760 hours.  CBG:  Recent Labs Lab 10/14/14 1356  GLUCAP 130*    Radiological Exams on Admission: Ct Head Wo Contrast  10/14/2014   CLINICAL DATA:  Sudden onset left-sided weakness with dizziness and leaning to the left. Symptoms began 1 hr ago. Headache.  EXAM: CT HEAD WITHOUT CONTRAST  TECHNIQUE: Contiguous axial images were obtained from the base of the skull through the vertex without intravenous contrast.  COMPARISON:  MR brain 01/01/2014 and CT head 12/27/2005.  FINDINGS: No evidence of an acute infarct, acute hemorrhage, mass lesion, mass effect or hydrocephalus. Remote infarcts involving the cerebellar hemispheres and left caudate. Minimal periventricular  low attenuation. No air-fluid levels in the paranasal sinuses or mastoid air cells.  IMPRESSION: 1. No acute intracranial abnormality. 2. Remote left caudate and cerebellar infarcts. 3. Minimal chronic microvascular white matter ischemic changes.   Electronically Signed   By: Lorin Picket M.D.   On: 10/14/2014 14:24   Mr Brain Wo Contrast  10/14/2014   CLINICAL DATA:  Dizziness, confusion, and ataxia. Slurred speech. Possible stroke.  EXAM: MRI HEAD WITHOUT CONTRAST  TECHNIQUE: Multiplanar, multiecho pulse sequences of the brain and surrounding structures were obtained without intravenous contrast.  COMPARISON:  Head CT 10/14/2014 and MRI 01/01/2014  FINDINGS: There is no evidence of acute infarct, intracranial hemorrhage, mass, midline shift, or extra-axial fluid collection. Overall cerebral volume is within normal limits for age. Focus of chronic microhemorrhage in the left gyrus rectus on the prior MRI is not clearly seen on the current study. Small, chronic infarcts are again seen throughout both cerebellar hemispheres, left basal ganglia, and right frontal lobe at the anterior aspect of the operculum. There is mild ex vacuo dilatation of the frontal horn of left lateral ventricle.  Orbits are unremarkable. Minimal left maxillary sinus and left ethmoid air cell mucosal thickening is noted. Mastoid air cells are clear. Major intracranial vascular flow voids are preserved.  IMPRESSION: 1. No acute intracranial abnormality. 2. Chronic infarcts as above.   Electronically Signed   By: Logan Bores   On: 10/14/2014 15:55    EKG: Independently reviewed.Sinus rhythm without any acute ST-T wave changes. Assessment/Plan   1. CVA. I think this episode today represented a cerebellar stroke. We do not see an acute stroke on MRI but symptoms are very suspicious, especially in view of MRI findings of multiple small chronic infarcts. He certainly has risk factors and he certainly has cerebellar signs. We will admit  him for stroke workup and ask neurology to see him in consultation. Start aspirin 325 mg daily. He may need Plavix also. 2. Diabetes. Continue with home medications and sliding scale of insulin. 3. 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 He has had problems with memory and I would not be surprised   if he is developing vascular  dementia. 4.   Hypertension. Stable.   Further recommendations will depend on patient's hospital progress.   Code Status: full code.   DVT Prophylaxis: heparin.   Family Communication: I discussed the plan with the patient and patient's wife at the bedside.   Disposition Plan: home when medically stable.   Time spent: 60 minutes.   Doree Albee [facility] Pager [phone].

## 2014-10-14 NOTE — ED Provider Notes (Signed)
CSN: [id number]     Arrival date & time 10/14/14  1352 History   First MD Initiated Contact with Patient 10/14/14 1404     Chief Complaint  Patient presents with  . Dizziness    An emergency department physician performed an initial assessment on this suspected stroke patient at 1402.   HPI Patient presents the emergency department after developing acute onset "dizziness" and difficulty walking.  He stages as though it felt as though things were moving in front of them.  No prior history of vertigo.  His wife also reports that he seems slightly confused.  He did vomit one time.  He denies headache at this time but he did report his wife earlier that he was having a headache.  Patient has had no symptoms similar to this in the past.  Patient does have diabetes and hyperlipidemia.  He is compliant with his medications.  Wife reports that he was in his normal state of health when he went to work today.   Past Medical History  Diagnosis Date  . Night sweats     every once in a while  . Hyperlipidemia   . Diabetes mellitus   . Hernia, inguinal   . Dizziness   . Chronic headaches   . Arthritis   . Neuropathy of lower extremity   . Diabetic peripheral neuropathy 03/28/2013  . Diverticulosis   . Hypertension   . Complication of anesthesia     bleed after last shoulder-had to stay overnight  . Impingement syndrome of left shoulder 08/02/2014  . 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 Inguinal hernia repair Bilateral   . Cervical disc surgery    . Wrist surgery Right     fusion  . Shoulder arthroscopy Right     1  . Knee arthroscopy Right   . Colon surgery  2003    1/3 removed for diverticulitis  . Colonoscopy    . Umbilical hernia repair      with other hernia repair with mesh  . Shoulder arthroscopy Left 08/02/2014    Procedure: LEFT SHOULDER SCOPE DEBRIDEMENT/ACROMIOPLASTY;  Surgeon: [name], MD;  Location: [place];  Service: Orthopedics;  Laterality: Left;  ANESTHESIA: GENERAL, PRE/POST OP SCALENE   Family History  Problem Relation Age of Onset  . Stroke Father   . Hyperlipidemia Father   . Diabetes Maternal Grandfather   . Heart attack Sister 25  . ALS Brother 42   History  Substance Use Topics  . Smoking status: Former Smoker -- 1.00 packs/day for 8 years    Types: Cigarettes    Start date: 06/07/1970    Quit date: 05/03/1978  . Smokeless tobacco: Former Systems developer    Types: [place] date: 05/03/1978  . Alcohol Use: 0.6 oz/week    1 Cans of beer per week     Comment: occasional shot of taquila    Review of Systems  All other systems reviewed and are negative.     Allergies  Review of patient's allergies indicates no known allergies.  Home Medications   Prior to Admission medications   Medication Sig Start Date End Date Taking? Authorizing Provider  aspirin 81 MG tablet Take 81 mg by mouth daily.     Yes Historical Provider, MD  baclofen (LIORESAL) 10 MG tablet Take 1 tablet (10 mg total) by mouth 3 (three) times daily. As needed for muscle spasm 08/02/14  Yes [name], MD  Cholecalciferol (VITAMIN D) 400 UNITS capsule Take 400 Units by mouth daily.     Yes Historical Provider, MD  Cyanocobalamin (VITAMIN B12 PO) Take by mouth daily.     Yes Historical Provider, MD  glyBURIDE micronized (GLYNASE) 6 MG tablet Take 1 tablet (6 mg total) by mouth 2 (two) times daily. 06/13/14  Yes Kathyrn Drown, MD  lisinopril (PRINIVIL,ZESTRIL) 2.5 MG tablet Take 1 tablet (2.5 mg total) by mouth every morning. 06/13/14  Yes Kathyrn Drown, MD  meloxicam (MOBIC) 15 MG tablet Take 1 tablet (15 mg total) by mouth daily. 06/13/14  Yes Kathyrn Drown, MD  metFORMIN (GLUCOPHAGE) 500 MG tablet Take 1 tablet (500 mg total) by mouth 2 (two) times daily with a meal. 06/13/14  Yes Kathyrn Drown, MD  oxyCODONE-acetaminophen (PERCOCET) 10-325 MG per tablet Take 1-2 tablets by mouth every 6  (six) hours as needed for pain. MAXIMUM TOTAL ACETAMINOPHEN DOSE IS 4000 MG PER DAY 08/02/14  Yes [name], MD  pravastatin (PRAVACHOL) 20 MG tablet Take 1 tablet (20 mg total) by mouth every morning. 06/13/14  Yes Kathyrn Drown, MD  pregabalin (LYRICA) 100 MG capsule Take 1 capsule (100 mg total) by mouth 3 (three) times daily. 06/13/14  Yes Kathyrn Drown, MD  LORazepam (ATIVAN) 1 MG tablet Take one tablet one hour before procedure Patient not taking: Reported on 10/14/2014 12/31/13   Kathyrn Drown, MD  ondansetron (ZOFRAN) 4 MG tablet Take 1 tablet (4 mg total) by mouth every 8 (eight) hours as needed for nausea or vomiting. Patient not taking: Reported on 10/14/2014 08/02/14   [name], MD  sennosides-docusate sodium (SENOKOT-S) 8.6-50 MG tablet Take 2 tablets by mouth daily. Patient not taking: Reported on 10/14/2014 08/02/14   [name], MD  valACYclovir (VALTREX) 1000 MG tablet Take 1 tablet (1,000 mg total) by mouth daily. Patient not taking: Reported on 10/14/2014 09/11/14   Kathyrn Drown, MD   BP 128/94 mmHg  Pulse 70  Temp(Src) 97.8 F (36.6 C) (Oral)  Resp 15  Ht 6\' 1"  (1.854 m)  Wt 185 lb (83.915 kg)  BMI 24.41 kg/m2  SpO2 98% Physical Exam  Constitutional: He is oriented to person, place, and time. He appears well-developed and well-nourished.  HENT:  Head: Normocephalic and atraumatic.  Eyes: EOM are normal. Pupils are equal, round, and reactive to light.  Neck: Normal range of motion.  Cardiovascular: Normal rate, regular rhythm, normal heart sounds and intact distal pulses.   Pulmonary/Chest: Effort normal and breath sounds normal. No respiratory distress.  Abdominal: Soft. He exhibits no distension. There is no tenderness.  Musculoskeletal: Normal range of motion.  Neurological: He is alert and oriented to person, place, and time.  5/5 strength in major muscle groups of  bilateral upper and lower extremities. Speech normal. No facial asymetry.   Skin: Skin is  warm and dry.  Psychiatric: He has a normal mood and affect. Judgment normal.  Nursing note and vitals reviewed.   ED Course  Procedures (including critical care time) Labs Review Labs Reviewed  COMPREHENSIVE METABOLIC PANEL - Abnormal; Notable for the following:    Chloride 112 (*)    Glucose, Bld 147 (*)    ALT 15 (*)    All other components within normal limits  CBG MONITORING, ED - Abnormal; Notable for the following:    Glucose-Capillary 130 (*)    All other components within normal limits  CBC WITH DIFFERENTIAL/PLATELET  Imaging Review Ct Head Wo Contrast  10/14/2014   CLINICAL DATA:  Sudden onset left-sided weakness with dizziness and leaning to the left. Symptoms began 1 hr ago. Headache.  EXAM: CT HEAD WITHOUT CONTRAST  TECHNIQUE: Contiguous axial images were obtained from the base of the skull through the vertex without intravenous contrast.  COMPARISON:  MR brain 01/01/2014 and CT head 12/27/2005.  FINDINGS: No evidence of an acute infarct, acute hemorrhage, mass lesion, mass effect or hydrocephalus. Remote infarcts involving the cerebellar hemispheres and left caudate. Minimal periventricular low attenuation. No air-fluid levels in the paranasal sinuses or mastoid air cells.  IMPRESSION: 1. No acute intracranial abnormality. 2. Remote left caudate and cerebellar infarcts. 3. Minimal chronic microvascular white matter ischemic changes.   Electronically Signed   By: Lorin Picket M.D.   On: 10/14/2014 14:24     EKG Interpretation   Date/Time:  Monday October 14 2014 14:00:15 EDT Ventricular Rate:  72 PR Interval:  149 QRS Duration: 102 QT Interval:  365 QTC Calculation: 399 R Axis:   63 Text Interpretation:  Sinus rhythm RSR' in V1 or V2, probably normal  variant No significant change was found Confirmed by Leighanna Kirn  MD, Lennette Bihari  ([id number]) on 10/14/2014 2:22:46 PM      MDM   Final diagnoses:  None    Patiently worked up for peripheral and central vertigo.  Initial  head CT is negative.  Patient will undergo MRI to further evaluate for possible causes of central vertigo.  He did require a second dose of Ativan for anxiety prior MRI.  His initial dose of benzodiazepine was given more for treatment of possible vertigo.  No weakness of his arms or legs at this time.  No indication for code stroke at this time.  Labs without significant abnormality.  Patient is diabetic.  Blood sugar was normal on arrival.  Care to Dr Leonides Schanz to follow up on MRI and reevaluate gait and mental status.    Jola Schmidt, MD 10/14/14 [phone]

## 2014-10-14 NOTE — ED Notes (Signed)
Ambulated pt in hallway.  Pt required maximum 2 person assist.  Pt leans and walks to left side.  MD at bedside discussing admission with pt and family.

## 2014-10-14 NOTE — ED Notes (Signed)
Pt became very agitated when told that he needed MRI.  Pt drowsy at this time.  Dr Venora Maples made aware.

## 2014-10-14 NOTE — ED Notes (Signed)
Pt will wake up at this time but remains drowsy.  States head is no longer hurting and dizziness is better.  Will continue to monitor.

## 2014-10-14 NOTE — ED Provider Notes (Signed)
4:00 PM  Assumed care from Dr. Venora Maples.  Pt here with new ataxia and vertigo.  Labs unremarkable.  MRI brain pending.   4:15 PM  Pt drowsy after receiving Ativan.  Will reassess and ambulate when awake. His MRI shows no acute abnormality. He does have multiple prior chronic strokes - frontal lobe, basal ganglia, bilateral cerebellum which may be contribute to his symptoms today. They were not aware that he has ever had strokes before and has not had a stroke workup. He is on aspirin 81 mg daily.   6:20 PM  Pt is now more awake. When he ambulates he still leans to the left side and requires significant assistance from 2 people. Unable to ambulate on his own. Wife reports she feels like his speech is change and he is still confused but is unclear if she has only noticed this speech change since he received Ativan. His MRI does not show any new stroke but does show old strokes. We'll discuss with hospitalist for admission for observation, symptom control for vertigo, stroke workup for his previous strokes.  6:55 PM  D/w Dr. Anastasio Champion for admission to medical bed, inpatient.  [place], DO 10/14/14 [phone]

## 2014-10-14 NOTE — ED Notes (Addendum)
Dizzy, confusion, "walking to the left"  Vomited x 1  Had headache pta

## 2014-10-15 ENCOUNTER — Inpatient Hospital Stay (HOSPITAL_COMMUNITY): Payer: 59

## 2014-10-15 DIAGNOSIS — I370 Nonrheumatic pulmonary valve stenosis: Secondary | ICD-10-CM

## 2014-10-15 LAB — GLUCOSE, CAPILLARY
GLUCOSE-CAPILLARY: 130 mg/dL — AB (ref 65–99)
Glucose-Capillary: 125 mg/dL — ABNORMAL HIGH (ref 65–99)
Glucose-Capillary: 156 mg/dL — ABNORMAL HIGH (ref 65–99)
Glucose-Capillary: 93 mg/dL (ref 65–99)

## 2014-10-15 LAB — LIPID PANEL
Cholesterol: 129 mg/dL (ref 0–200)
HDL: 30 mg/dL — ABNORMAL LOW (ref >40)
LDL Cholesterol: 76 mg/dL (ref 0–99)
Total CHOL/HDL Ratio: 4.3 RATIO
Triglycerides: 114 mg/dL (ref <150)
VLDL: 23 mg/dL (ref 0–40)

## 2014-10-15 LAB — TSH: TSH: 1.895 u[IU]/mL (ref 0.350–4.500)

## 2014-10-15 MED ORDER — LORAZEPAM 2 MG/ML IJ SOLN
1.0000 mg | Freq: Once | INTRAMUSCULAR | Status: AC
  Administered 2014-10-15: 1 mg via INTRAVENOUS
  Filled 2014-10-15: qty 1

## 2014-10-15 NOTE — Progress Notes (Signed)
[facility] PROGRESS NOTE  BROGHAN PANNONE TGG:[id number] DOB: [birth year] DOA: 10/14/2014 PCP: [name], MD  Assessment/Plan: 1. CVA. Concern for cerebellar stroke. No acute stroke on MRI but symptoms are very suspicious, especially in view of MRI findings of multiple small chronic infarcts. He certainly has risk factors and he certainly has cerebellar signs. Will obtain MRA. Lipid panel with HDL 30 otherwise unremarkable. HgA1c pending.  Continue  aspirin 325 mg daily and statin. He may need Plavix also. Await neurology input 2. Diabetes. CBG 125-156.  Continue with home medications and sliding scale of insulin. Await HgA1c 3. 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 He has had problems with memory. Concern for vascular dementia. See above. 4. Hypertension. Only fair control. Home meds include lisinopril. Continuing this. monitor  Code Status: full Family Communication: wife at bedside Disposition Plan: home hopefully tomorrow   Consultants:  neurology  Procedures:  none  Antibiotics:  none  HPI/Subjective: Lying in bed reports symptoms only slightly better. No worse  Objective: Filed Vitals:   10/15/14 0830  BP: 151/95  Pulse: 74  Temp: 98.7 F (37.1 C)  Resp: 16    Intake/Output Summary (Last 24 hours) at 10/15/14 1500 Last data filed at 10/15/14 0900  Gross per 24 hour  Intake 739.17 ml  Output    600 ml  Net 139.17 ml   Filed Weights   10/14/14 1359 10/14/14 1953  Weight: 83.915 kg (185 lb) 83.6 kg (184 lb 4.9 oz)    Exam:   General:  Well nourished  Cardiovascular: RRR no MGR  Respiratory: normal effort BS clear bilaterally   Abdomen: non-distended non-tender +BS  Musculoskeletal: no clubbing or cyanosis   Data Reviewed: Basic Metabolic Panel:  Recent Labs Lab 10/14/14 1402  NA 142  K 4.1  CL 112*  CO2 24  GLUCOSE 147*  BUN 18  CREATININE 0.88  CALCIUM 8.9   Liver Function Tests:  Recent Labs Lab 10/14/14 1402  AST  20  ALT 15*  ALKPHOS 59  BILITOT 0.6  PROT 6.8  ALBUMIN 4.2   No results for input(s): LIPASE, AMYLASE in the last 168 hours. No results for input(s): AMMONIA in the last 168 hours. CBC:  Recent Labs Lab 10/14/14 1402  WBC 7.3  NEUTROABS 4.5  HGB 14.4  HCT 41.0  MCV 93.4  PLT 170   Cardiac Enzymes: No results for input(s): CKTOTAL, CKMB, CKMBINDEX, TROPONINI in the last 168 hours. BNP (last 3 results) No results for input(s): BNP in the last 8760 hours.  ProBNP (last 3 results) No results for input(s): PROBNP in the last 8760 hours.  CBG:  Recent Labs Lab 10/14/14 1356 10/14/14 2113 10/15/14 0738 10/15/14 1142  GLUCAP 130* 86 125* 156*    No results found for this or any previous visit (from the past 240 hour(s)).   Studies: Ct Head Wo Contrast  10/14/2014   CLINICAL DATA:  Sudden onset left-sided weakness with dizziness and leaning to the left. Symptoms began 1 hr ago. Headache.  EXAM: CT HEAD WITHOUT CONTRAST  TECHNIQUE: Contiguous axial images were obtained from the base of the skull through the vertex without intravenous contrast.  COMPARISON:  MR brain 01/01/2014 and CT head 12/27/2005.  FINDINGS: No evidence of an acute infarct, acute hemorrhage, mass lesion, mass effect or hydrocephalus. Remote infarcts involving the cerebellar hemispheres and left caudate. Minimal periventricular low attenuation. No air-fluid levels in the paranasal sinuses or mastoid air cells.  IMPRESSION: 1. No acute intracranial abnormality. 2. Remote left caudate  and cerebellar infarcts. 3. Minimal chronic microvascular white matter ischemic changes.   Electronically Signed   By: Lorin Picket M.D.   On: 10/14/2014 14:24   Mr Brain Wo Contrast  10/14/2014   CLINICAL DATA:  Dizziness, confusion, and ataxia. Slurred speech. Possible stroke.  EXAM: MRI HEAD WITHOUT CONTRAST  TECHNIQUE: Multiplanar, multiecho pulse sequences of the brain and surrounding structures were obtained without  intravenous contrast.  COMPARISON:  Head CT 10/14/2014 and MRI 01/01/2014  FINDINGS: There is no evidence of acute infarct, intracranial hemorrhage, mass, midline shift, or extra-axial fluid collection. Overall cerebral volume is within normal limits for age. Focus of chronic microhemorrhage in the left gyrus rectus on the prior MRI is not clearly seen on the current study. Small, chronic infarcts are again seen throughout both cerebellar hemispheres, left basal ganglia, and right frontal lobe at the anterior aspect of the operculum. There is mild ex vacuo dilatation of the frontal horn of left lateral ventricle.  Orbits are unremarkable. Minimal left maxillary sinus and left ethmoid air cell mucosal thickening is noted. Mastoid air cells are clear. Major intracranial vascular flow voids are preserved.  IMPRESSION: 1. No acute intracranial abnormality. 2. Chronic infarcts as above.   Electronically Signed   By: Logan Bores   On: 10/14/2014 15:55   US Carotid Bilateral  10/15/2014   CLINICAL DATA:  54 year old male with symptoms of transient ischemic attack  EXAM: BILATERAL CAROTID DUPLEX ULTRASOUND  TECHNIQUE: Pearline Cables scale imaging, color Doppler and duplex ultrasound were performed of bilateral carotid and vertebral arteries in the neck.  COMPARISON:  Brain MRI 10/14/2014  FINDINGS: Criteria: Quantification of carotid stenosis is based on velocity parameters that correlate the residual internal carotid diameter with NASCET-based stenosis levels, using the diameter of the distal internal carotid lumen as the denominator for stenosis measurement.  The following velocity measurements were obtained:  RIGHT  ICA:  67/26 cm/sec  CCA:  267/12 cm/sec  SYSTOLIC ICA/CCA RATIO:  0.6  DIASTOLIC ICA/CCA RATIO:  0.9  ECA:  103 cm/sec  LEFT  ICA:  65/21 cm/sec  CCA:  458/09 cm/sec  SYSTOLIC ICA/CCA RATIO:  0.6  DIASTOLIC ICA/CCA RATIO:  0.7  ECA:  65 cm/sec  RIGHT CAROTID ARTERY: No significant atherosclerotic plaque or evidence  of stenosis.  RIGHT VERTEBRAL ARTERY:  Patent with normal antegrade flow.  LEFT CAROTID ARTERY: No significant atherosclerotic plaque or evidence of stenosis pair  LEFT VERTEBRAL ARTERY:  Patent with normal antegrade flow.  IMPRESSION: Negative bilateral carotid duplex study. No evidence of significant atherosclerotic plaque or stenosis.  Signed,  Criselda Peaches, MD  [facility]  [facility]   Electronically Signed   By: Jacqulynn Cadet M.D.   On: 10/15/2014 10:14    Scheduled Meds: .  stroke: mapping our early stages of recovery book   Does not apply Once  . aspirin  300 mg Rectal Daily   Or  . aspirin  325 mg Oral Daily  . cholecalciferol  400 Units Oral Daily  . glyBURIDE micronized  6 mg Oral BID AC  . heparin  5,000 Units Subcutaneous 3 times per day  . insulin aspart  0-15 Units Subcutaneous TID WC  . insulin aspart  0-5 Units Subcutaneous QHS  . lisinopril  2.5 mg Oral q morning - 10a  . metFORMIN  500 mg Oral BID WC  . pravastatin  20 mg Oral q1800  . pregabalin  75 mg Oral TID   Continuous Infusions: .  sodium chloride 50 mL/hr at 10/14/14 2101    Active Problems:   Diabetes type 2, controlled   Hyperlipidemia   Vertigo   Multi-infarct state   Hypertension   CVA (cerebral infarction)    Time spent: 37 minutes    [facility] Pager [phone]. If 7PM-7AM, please contact night-coverage at [email or web address], password [facility] 10/15/2014, 3:00 PM  LOS: 1 day

## 2014-10-15 NOTE — Evaluation (Signed)
Physical Therapy Evaluation Patient Details Name: Travis [name] MRN: [id number] DOB: August 06, 1960 Today's Date: 10/15/2014   History of Present Illness  Pt is a 54 year old man who has a history of diabetes, hypertension, hyperlipidemia who presents with an acute onset of dizziness approximately 1 PM today. He found it difficulty with walking and tended to veer over to the left side. He also had diplopia today and his speech has been slurred. He tells me that he has had 3-4 such episodes in the last 2-3 years. He denies any chest pain, palpitations, dyspnea, nausea or vomiting. There is no abdominal pain or fever. There is no cough. His primary care physician send him for an MRI scan in September 2015 which showed multiple small chronic infarcts in the posterior cerebellar hemispheres. There are also chronic lacunar infarct in the left basal ganglia. There was also a small chronic cortically based infarct in the anterior right frontal lobe. Evaluation today in the emergency room shows chronic infarcts again throughout both cerebellar hemispheres, left basal ganglia and right frontal lobe. His wife also describes forgetfulness in the last few months of short-term events. He is now being admitted for further investigation.  Clinical Impression   Pt was seen for evaluation.  He was alert and oriented, reports that he continues to have mild continual dizziness.  This dizziness does not appear to be positional related and I saw no nystagmus.  He does report a "blurriness" in the left eye which is of recent etiology-he has actually not had any true diplopia.  He also relates that the dizziness seems to occur when he suddenly turned his head to the side recently.  While I have advised him that he should not be trying to walk if dizziness is severe, a walker is currently useful to providing supplemental support.  If time or medications do not resolve this problem, I am recommending OP PT evaluation.    Follow Up  Recommendations Outpatient PT (if dizziness does not resolve)    Equipment Recommendations  Rolling walker with 5" wheels    Recommendations for Other Services        Resume OT for shoulder problem    Precautions / Restrictions Precautions Precautions: Fall Restrictions Weight Bearing Restrictions: No      Mobility  Bed Mobility Overal bed mobility: Modified Independent                Transfers Overall transfer level: Modified independent Equipment used:  (witnesssed pt walk from bathroom with IV pole for support )             General transfer comment: slightly unsteady on standing and reaches for IV pole  Ambulation/Gait Ambulation/Gait assistance: Modified independent (Device/Increase time) Ambulation Distance (Feet): 150 Feet Assistive device: Rolling walker (2 wheeled) Gait Pattern/deviations: WFL(Within Functional Limits)   Gait velocity interpretation: at or above normal speed for age/gender General Gait Details: pt is able to ambulate with a cane but is much more steady with a walker                       Balance Overall balance assessment: Needs assistance Sitting-balance support: No upper extremity supported;Feet supported Sitting balance-Leahy Scale: Normal     Standing balance support: No upper extremity supported;During functional activity Standing balance-Leahy Scale: Fair Standing balance comment: balance compromised by dizziness  Pertinent Vitals/Pain Pain Assessment: No/denies pain    Home Living Family/patient expects to be discharged to:: Private residence Living Arrangements: Spouse/significant other Available Help at Discharge: Family Type of Home: House       Home Layout: One level Home Equipment: None      Prior Function Level of Independence: Independent               Hand Dominance   Dominant Hand: Right    Extremity/Trunk Assessment   Upper Extremity  Assessment: Defer to OT evaluation       LUE Deficits / Details: LUE weakness in shoulder/elbow/hand   Lower Extremity Assessment: Overall WFL for tasks assessed      Cervical / Trunk Assessment: Normal  Communication   Communication: No difficulties  Cognition Arousal/Alertness: Awake/alert Behavior During Therapy: WFL for tasks assessed/performed Overall Cognitive Status: Within Functional Limits for tasks assessed                                    Assessment/Plan    PT Assessment All further PT needs can be met in the next venue of care  PT Diagnosis Difficulty walking   PT Problem List Decreased balance  PT Treatment Interventions     PT Goals (Current goals can be found in the Care Plan section) Acute Rehab PT Goals PT Goal Formulation: All assessment and education complete, DC therapy         Barriers to discharge  none                     End of Session Equipment Utilized During Treatment: Gait belt Activity Tolerance: Patient tolerated treatment well Patient left: in bed;with call bell/phone within reach;with bed alarm set Nurse Communication: Mobility status         Time: 0102-7253 PT Time Calculation (min) (ACUTE ONLY): 48 min   Charges:   PT Evaluation $Initial PT Evaluation Tier I: 1 Procedure     PT G CodesDemetrios Isaacs L  PT 10/15/2014, 12:18 PM [phone]

## 2014-10-15 NOTE — Care Management Note (Signed)
Case Management Note  Patient Details  Name: SAVION WASHAM MRN: [id number] Date of Birth: 04/28/1961  Subjective/Objective:                  Pt admitted from home with vertigo and possible CVA. Pt lives with his wife and will return home at discharge. Pt is independent with ADL's.  Action/Plan: Awaiting therapy consult and recommendations and neuro consult. Will continue to follow for discharge planning needs.  Expected Discharge Date:                  Expected Discharge Plan:  Home/Self Care  In-House Referral:  NA  Discharge planning Services  CM Consult  Post Acute Care Choice:    Choice offered to:     DME Arranged:    DME Agency:     HH Arranged:    HH Agency:     Status of Service:  In process, will continue to follow  Medicare Important Message Given:    Date Medicare IM Given:    Medicare IM give by:    Date Additional Medicare IM Given:    Additional Medicare Important Message give by:     If discussed at [place] of Stay Meetings, dates discussed:    Additional Comments:  Joylene Draft, RN 10/15/2014, 10:48 AM

## 2014-10-15 NOTE — Progress Notes (Signed)
UR chart review completed.  

## 2014-10-15 NOTE — Evaluation (Signed)
Occupational Therapy Evaluation Patient Details Name: Travis [name] MRN: [id number] DOB: 1960/12/06 Today's Date: 10/15/2014    History of Present Illness Pt is a 54 year old man who has a history of diabetes, hypertension, hyperlipidemia who presents with an acute onset of dizziness approximately 1 PM today. He found it difficulty with walking and tended to veer over to the left side. He also had diplopia today and his speech has been slurred. He tells me that he has had 3-4 such episodes in the last 2-3 years. He denies any chest pain, palpitations, dyspnea, nausea or vomiting. There is no abdominal pain or fever. There is no cough. His primary care physician send him for an MRI scan in September 2015 which showed multiple small chronic infarcts in the posterior cerebellar hemispheres. There are also chronic lacunar infarct in the left basal ganglia. There was also a small chronic cortically based infarct in the anterior right frontal lobe. Evaluation today in the emergency room shows chronic infarcts again throughout both cerebellar hemispheres, left basal ganglia and right frontal lobe. His wife also describes forgetfulness in the last few months of short-term events. He is now being admitted for further investigation.   Clinical Impression   PTA pt lived at home with wife and reports independence in B/IADL tasks. Pt is receiving outpatient OT services for left shoulder rehabilitation. Pt awake, alert, and oriented x4 this am, pt was coming out of bathroom upon OT entrance into room, using IV pole to balance during functional mobility task. Pt stood at sink and washed hands, leaning against the counter for stability, stating he is still dizzy. Pt demonstrates LUE range of motion WFL and decreased strength of 4-/5. Pt reports tingling sensation in left digits since yesterday morning. Pt reluctantly reports diplopia, stating "I think it's getting better though." No further acute OT needs, as pt is  able to complete B/IADL tasks. Pt plans to continue outpatient OT services upon discharge.    Follow Up Recommendations  No OT follow up    Equipment Recommendations  None recommended by OT       Precautions / Restrictions Precautions Precautions: Fall Restrictions Weight Bearing Restrictions: No      Mobility Bed Mobility Overal bed mobility: Modified Independent                Transfers Overall transfer level: Modified independent Equipment used:  (witnesssed pt walk from bathroom with IV pole for support )                       ADL Overall ADL's : Needs assistance/impaired                                       General ADL Comments: Pt is able to complete all BADL tasks however requires supervision for safety due to decreased balance and continued dizziness     Vision Vision Assessment?: Yes Eye Alignment: Within Functional Limits Ocular Range of Motion: Within Functional Limits Alignment/Gaze Preference: Within Defined Limits Tracking/Visual Pursuits: Able to track stimulus in all quads without difficulty Saccades: Within functional limits Convergence: Within functional limits Visual Fields: No apparent deficits Diplopia Assessment: Disappears with one eye closed          Pertinent Vitals/Pain Pain Assessment: No/denies pain     Hand Dominance Right   Extremity/Trunk Assessment Upper Extremity Assessment Upper Extremity Assessment: LUE deficits/detail LUE Deficits /  Details: LUE weakness in shoulder/elbow/hand LUE Sensation:  (pt reports tingling feeling in digits) LUE Coordination: decreased fine motor (delayed )   Lower Extremity Assessment Lower Extremity Assessment: Defer to PT evaluation       Communication Communication Communication: No difficulties   Cognition Arousal/Alertness: Awake/alert Behavior During Therapy: WFL for tasks assessed/performed Overall Cognitive Status: Within Functional Limits for tasks  assessed                                Home Living Family/patient expects to be discharged to:: Private residence Living Arrangements: Spouse/significant other Available Help at Discharge: Family Type of Home: House             Bathroom Shower/Tub: Teaching laboratory technician Toilet: Standard     Home Equipment: None          Prior Functioning/Environment Level of Independence: Independent             OT Diagnosis: Disturbance of vision;Generalized weakness   OT Problem List: Decreased strength    End of Session    Activity Tolerance: Patient tolerated treatment well Patient left: in bed;with call bell/phone within reach;with bed alarm set;with family/visitor present   Time: 5364-6803 OT Time Calculation (min): 21 min Charges:  OT General Charges $OT Visit: 1 Procedure OT Evaluation $Initial OT Evaluation Tier I: 1 Procedure  Guadelupe Sabin, OTR/L   [phone]  10/15/2014, 9:08 AM
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
## 2014-10-16 ENCOUNTER — Ambulatory Visit (HOSPITAL_COMMUNITY): Payer: 59

## 2014-10-16 ENCOUNTER — Inpatient Hospital Stay (HOSPITAL_COMMUNITY)
Admit: 2014-10-16 | Discharge: 2014-10-16 | Disposition: A | Discharge status: Home / Self Care | Payer: 59 | Attending: Neurology | Admitting: Neurology

## 2014-10-16 DIAGNOSIS — E785 Hyperlipidemia, unspecified: Secondary | ICD-10-CM

## 2014-10-16 DIAGNOSIS — E119 Type 2 diabetes mellitus without complications: Secondary | ICD-10-CM

## 2014-10-16 DIAGNOSIS — Z8673 Personal history of transient ischemic attack (TIA), and cerebral infarction without residual deficits: Secondary | ICD-10-CM | POA: Insufficient documentation

## 2014-10-16 DIAGNOSIS — R42 Dizziness and giddiness: Secondary | ICD-10-CM

## 2014-10-16 LAB — GLUCOSE, CAPILLARY
GLUCOSE-CAPILLARY: 164 mg/dL — AB (ref 65–99)
Glucose-Capillary: 130 mg/dL — ABNORMAL HIGH (ref 65–99)
Glucose-Capillary: 100 mg/dL — ABNORMAL HIGH (ref 65–99)

## 2014-10-16 LAB — HEMOGLOBIN A1C
HEMOGLOBIN A1C: 6.6 % — AB (ref 4.8–5.6)
MEAN PLASMA GLUCOSE: 143 mg/dL

## 2014-10-16 LAB — VITAMIN B12: Vitamin B-12: 575 pg/mL (ref 180–914)

## 2014-10-16 MED ORDER — LEVETIRACETAM 250 MG PO TABS: 250.0000 mg | ORAL_TABLET | Freq: Two times a day (BID) | ORAL | Status: DC

## 2014-10-16 MED ORDER — ASPIRIN 81 MG PO TABS: 162.0000 mg | ORAL_TABLET | Freq: Every day | ORAL | Status: DC

## 2014-10-16 MED ORDER — LEVETIRACETAM 250 MG PO TABS
250.0000 mg | ORAL_TABLET | Freq: Two times a day (BID) | ORAL | Status: DC
  Administered 2014-10-16: 250 mg via ORAL
  Filled 2014-10-16: qty 1

## 2014-10-16 MED ORDER — MECLIZINE HCL 25 MG PO TABS: 25.0000 mg | ORAL_TABLET | Freq: Three times a day (TID) | ORAL | Status: DC | PRN

## 2014-10-16 NOTE — Procedures (Signed)
[place] A. Merlene Laughter, MD     [email or web address]           HISTORY: The patient presents with episodes of dizziness, headaches, confusion and ataxia is worrisome for complex partial seizures.  MEDICATIONS: Scheduled Meds: . aspirin  300 mg Rectal Daily   Or  . aspirin  325 mg Oral Daily  . cholecalciferol  400 Units Oral Daily  . glyBURIDE micronized  6 mg Oral BID AC  . heparin  5,000 Units Subcutaneous 3 times per day  . insulin aspart  0-15 Units Subcutaneous TID WC  . insulin aspart  0-5 Units Subcutaneous QHS  . lisinopril  2.5 mg Oral q morning - 10a  . metFORMIN  500 mg Oral BID WC  . pravastatin  20 mg Oral q1800  . pregabalin  75 mg Oral TID   Continuous Infusions: . sodium chloride 50 mL/hr at 10/16/14 1200   PRN Meds:.  Prior to Admission medications   Medication Sig Start Date End Date Taking? Authorizing Provider  baclofen (LIORESAL) 10 MG tablet Take 1 tablet (10 mg total) by mouth 3 (three) times daily. As needed for muscle spasm 08/02/14  Yes [name], MD  Cholecalciferol (VITAMIN D) 400 UNITS capsule Take 400 Units by mouth daily.     Yes Historical Provider, MD  Cyanocobalamin (VITAMIN B12 PO) Take by mouth daily.     Yes Historical Provider, MD  glyBURIDE micronized (GLYNASE) 6 MG tablet Take 1 tablet (6 mg total) by mouth 2 (two) times daily. 06/13/14  Yes Kathyrn Drown, MD  lisinopril (PRINIVIL,ZESTRIL) 2.5 MG tablet Take 1 tablet (2.5 mg total) by mouth every morning. 06/13/14  Yes Kathyrn Drown, MD  meloxicam (MOBIC) 15 MG tablet Take 1 tablet (15 mg total) by mouth daily. 06/13/14  Yes Kathyrn Drown, MD  metFORMIN (GLUCOPHAGE) 500 MG tablet Take 1 tablet (500 mg total) by mouth 2 (two) times daily with a meal. 06/13/14  Yes Kathyrn Drown, MD  oxyCODONE-acetaminophen (PERCOCET) 10-325 MG per tablet Take 1-2 tablets by mouth every 6 (six) hours as needed for pain. MAXIMUM TOTAL ACETAMINOPHEN DOSE IS 4000 MG PER DAY 08/02/14  Yes  [name], MD  pravastatin (PRAVACHOL) 20 MG tablet Take 1 tablet (20 mg total) by mouth every morning. 06/13/14  Yes Kathyrn Drown, MD  pregabalin (LYRICA) 100 MG capsule Take 1 capsule (100 mg total) by mouth 3 (three) times daily. 06/13/14  Yes Kathyrn Drown, MD  aspirin 81 MG tablet Take 2 tablets (162 mg total) by mouth daily. 10/16/14   Kathie Dike, MD  levETIRAcetam (KEPPRA) 250 MG tablet Take 1 tablet (250 mg total) by mouth 2 (two) times daily. 10/16/14   Kathie Dike, MD  LORazepam (ATIVAN) 1 MG tablet Take one tablet one hour before procedure Patient not taking: Reported on 10/14/2014 12/31/13   Kathyrn Drown, MD  meclizine (ANTIVERT) 25 MG tablet Take 1 tablet (25 mg total) by mouth 3 (three) times daily as needed for dizziness. 10/16/14   Kathie Dike, MD  ondansetron (ZOFRAN) 4 MG tablet Take 1 tablet (4 mg total) by mouth every 8 (eight) hours as needed for nausea or vomiting. Patient not taking: Reported on 10/14/2014 08/02/14   [name], MD  sennosides-docusate sodium (SENOKOT-S) 8.6-50 MG tablet Take 2 tablets by mouth daily. Patient not taking: Reported on 10/14/2014 08/02/14   [name], MD  valACYclovir (VALTREX) 1000 MG tablet Take 1 tablet (1,000 mg total) by mouth  daily. Patient not taking: Reported on 10/14/2014 09/11/14   Kathyrn Drown, MD      ANALYSIS: A 16 channel recording using standard 10 20 measurements is conducted for 23 minutes. There is a well-formed posterior dominant rhythm of 9-1/2-10 Hz which attenuates with eye opening. There is beta activity observed in the frontal areas. Awake and sleep activities are observed. K complexes and sleep spindles are recorded indicating stage II non-REM sleep. Vertex sharp waves are also observed. Photic stimulation is carried out without abnormal responses. There is no focal or lateral slowing. There is no epileptiform activity observed.   IMPRESSION: This is a normal recording of awake and sleep  states.      Nealie Mchatton A. Merlene Laughter, M.D.  Diplomate, Tax adviser of Psychiatry and Neurology ( Neurology).

## 2014-10-16 NOTE — Care Management Note (Signed)
Case Management Note  Patient Details  Name: Travis [name] MRN: [id number] Date of Birth: Aug 02, 1960  Subjective/Objective:                    Action/Plan:   Expected Discharge Date:                  Expected Discharge Plan:  Home/Self Care  In-House Referral:  NA  Discharge planning Services  CM Consult  Post Acute Care Choice:  Durable Medical Equipment Choice offered to:  Patient  DME Arranged:  [facility] rolling DME Agency:     HH Arranged:    [place] Agency:     Status of Service:  Completed, signed off  Medicare Important Message Given:    Date Medicare IM Given:    Medicare IM give by:    Date Additional Medicare IM Given:    Additional Medicare Important Message give by:     If discussed at [place] of Stay Meetings, dates discussed:    Additional Comments: Pt discharged home today. Outpt PT referral formed faxed to [place] and they will call pt and schedule appt times. Pt has script for walker if he decides he wants one. No other CM needs noted. Christinia Gully [place], RN 10/16/2014, 3:55 PM

## 2014-10-16 NOTE — Progress Notes (Signed)
EEG completed, results pending. 

## 2014-10-16 NOTE — Progress Notes (Signed)
Patient ID: Travis [name], male   DOB: 1960/11/05, 54 y.o.   MRN: [id number]  [place] A. Merlene Laughter, MD     [email or web address]          Travis [name] is an 54 y.o. male.   Assessment/Plan: 1. Recurrent episodes of confusion, ataxia, dysarthria and dizziness associated with headaches. Etiologies includes complex partial seizures, migraine headaches and ischemic events. Given the negative imaging for acute infarcts in the recurrent nature I believe that ischemic events are unlikely. The patient has had previous infarcts and also had injuries increases risk of seizures. I think this is most likely etiology. 2. Right frontal cortical/Subcortical lesions likely related to previous head injury. This likely serves as the substrate for the patient's recurrent episodic events. 3. Previous lacunar infarcts. 4. Hypertension. 5. Diabetes. 6. Diabetic neuropathy.  RECOMMENDATION: EEG --- Normal but continue with low dose keppra FU in office 4 weeks Additional blood test for the following: Homocystine level, RPR, thyroid function tests and HIV. Trial of low-dose Keppra for now. - 250 bid Discontinue meloxicam.  GENERAL: This a pleasant male in no acute distress.   HEENT: Supple. Atraumatic normocephalic.   ABDOMEN: soft  EXTREMITIES: No edema   BACK: Normal.  SKIN: Normal by inspection.   MENTAL STATUS: Alert and oriented. Speech, language and cognition are generally intact. Judgment and insight normal.   CRANIAL NERVES: Pupils are equal, round and reactive to light and accommodation; extra ocular movements are full, there is no significant nystagmus; visual fields are full; upper and lower facial muscles are normal in strength and symmetric, there is no flattening of the nasolabial folds; tongue is midline; uvula is midline; shoulder elevation is normal.  MOTOR: Normal tone, bulk and strength; Mild downward pronator drift left upper extremity.  COORDINATION:  Left finger to nose is slightly impaired showing some evidence of dysmetria and past-pointing, right finger to nose is normal, No rest tremor; no intention tremor; no postural tremor; no bradykinesia.  REFLEXES: Deep tendon reflexes are symmetrical and normal in the upper extremities but diminished in the legs. Babinski reflexes are flexor bilaterally.   SENSATION: Normal to light touch.      The brain MRI and MRA of the brain are reviewed in person. There are lucencies seen on T1 involving the middle and superior cerebellar regions consistent with multiple infarcts in the hemisphere bilaterally. There is also a large lacunar infarct involving the head of the caudate nuclei on the right side. On T2/flare there is a wedge-shaped increased signal seen involving the right frontal region between the Rice and MCA distribution consistent with a watershed infarct that is chronic. Nothing acute is seen on diffusion imaging. No bleed/blood broken bone products are seen.  Brain MRA shows no intracranial occlusive disease.    Objective: Vital signs in last 24 hours: Temp:  [98 F (36.7 C)-98.7 F (37.1 C)] 98.6 F (37 C) (06/15 1353) Pulse Rate:  [65-84] 68 (06/15 1353) Resp:  [16-20] 18 (06/15 1353) BP: (108-143)/(61-93) 136/86 mmHg (06/15 1353) SpO2:  [95 %-100 %] 98 % (06/15 1353)  Intake/Output from previous day: 06/14 0701 - 06/15 0700 In: 720 [P.O.:720] Out: -  Intake/Output this shift: Total I/O In: 720 [P.O.:720] Out: -  Nutritional status: Diet heart healthy/carb modified Room service appropriate?: Yes; Fluid consistency:: Thin Diet - low sodium heart healthy   Lab Results: Results for orders placed or performed during the hospital encounter of 10/14/14 (from the past 48 hour(s))  Urinalysis, Routine w  reflex microscopic (not at [facility])     Status: Abnormal   Collection Time: 10/14/14  7:38 PM  Result Value Ref Range   Color, Urine YELLOW YELLOW   APPearance CLEAR CLEAR    Specific Gravity, Urine >1.030 (H) 1.005 - 1.030   pH 5.5 5.0 - 8.0   Glucose, UA 100 (A) NEGATIVE mg/dL   Hgb urine dipstick NEGATIVE NEGATIVE   Bilirubin Urine NEGATIVE NEGATIVE   Ketones, ur NEGATIVE NEGATIVE mg/dL   Protein, ur NEGATIVE NEGATIVE mg/dL   Urobilinogen, UA 0.2 0.0 - 1.0 mg/dL   Nitrite NEGATIVE NEGATIVE   Leukocytes, UA NEGATIVE NEGATIVE    Comment: MICROSCOPIC NOT DONE ON URINES WITH NEGATIVE PROTEIN, BLOOD, LEUKOCYTES, NITRITE, OR GLUCOSE <1000 mg/dL.  Glucose, capillary     Status: None   Collection Time: 10/14/14  9:13 PM  Result Value Ref Range   Glucose-Capillary 86 65 - 99 mg/dL   Comment 1 Notify RN   Lipid panel     Status: Abnormal   Collection Time: 10/15/14  5:45 AM  Result Value Ref Range   Cholesterol 129 0 - 200 mg/dL   Triglycerides 114 <150 mg/dL   HDL 30 (L) >40 mg/dL   Total CHOL/HDL Ratio 4.3 RATIO   VLDL 23 0 - 40 mg/dL   LDL Cholesterol 76 0 - 99 mg/dL    Comment:        Total Cholesterol/HDL:CHD Risk Coronary Heart Disease Risk Table                     Men   Women  1/2 Average Risk   3.4   3.3  Average Risk       5.0   4.4  2 X Average Risk   9.6   7.1  3 X Average Risk  23.4   11.0        Use the calculated Patient Ratio above and the CHD Risk Table to determine the patient's CHD Risk.        ATP III CLASSIFICATION (LDL):  <100     mg/dL   Optimal  100-129  mg/dL   Near or Above                    Optimal  130-159  mg/dL   Borderline  160-189  mg/dL   High  >190     mg/dL   Very High   Glucose, capillary     Status: Abnormal   Collection Time: 10/15/14  7:38 AM  Result Value Ref Range   Glucose-Capillary 125 (H) 65 - 99 mg/dL   Comment 1 Notify RN   Glucose, capillary     Status: Abnormal   Collection Time: 10/15/14 11:42 AM  Result Value Ref Range   Glucose-Capillary 156 (H) 65 - 99 mg/dL   Comment 1 Notify RN   Glucose, capillary     Status: None   Collection Time: 10/15/14  4:43 PM  Result Value Ref Range    Glucose-Capillary 93 65 - 99 mg/dL  Glucose, capillary     Status: Abnormal   Collection Time: 10/15/14 10:30 PM  Result Value Ref Range   Glucose-Capillary 130 (H) 65 - 99 mg/dL   Comment 1 Notify RN    Comment 2 Document in Chart   Glucose, capillary     Status: Abnormal   Collection Time: 10/16/14  7:19 AM  Result Value Ref Range   Glucose-Capillary 130 (H) 65 - 99 mg/dL  Glucose, capillary     Status: Abnormal   Collection Time: 10/16/14 11:22 AM  Result Value Ref Range   Glucose-Capillary 164 (H) 65 - 99 mg/dL  Glucose, capillary     Status: Abnormal   Collection Time: 10/16/14  4:41 PM  Result Value Ref Range   Glucose-Capillary 100 (H) 65 - 99 mg/dL   Comment 1 Notify RN    Comment 2 Document in Chart     Lipid Panel  Recent Labs  10/15/14 0545  CHOL 129  TRIG 114  HDL 30*  CHOLHDL 4.3  VLDL 23  LDLCALC 76    Studies/Results:   Medications:  Scheduled Meds: . aspirin  300 mg Rectal Daily   Or  . aspirin  325 mg Oral Daily  . cholecalciferol  400 Units Oral Daily  . glyBURIDE micronized  6 mg Oral BID AC  . heparin  5,000 Units Subcutaneous 3 times per day  . insulin aspart  0-15 Units Subcutaneous TID WC  . insulin aspart  0-5 Units Subcutaneous QHS  . levETIRAcetam  250 mg Oral BID  . lisinopril  2.5 mg Oral q morning - 10a  . metFORMIN  500 mg Oral BID WC  . pravastatin  20 mg Oral q1800  . pregabalin  75 mg Oral TID   Continuous Infusions: . sodium chloride 50 mL/hr at 10/16/14 1200   PRN Meds:.     LOS: 2 days   Yoshiharu Brassell A. Merlene Laughter, M.D.  Diplomate, Tax adviser of Psychiatry and Neurology ( Neurology).

## 2014-10-16 NOTE — Evaluation (Signed)
Speech Language Pathology Evaluation Patient Details Name: Travis [name] MRN: [id number] DOB: 1960-07-12 Today's Date: 10/16/2014 Time: 0251-0322 SLP Time Calculation (min) (ACUTE ONLY): 31 min  Problem List:  Patient Active Problem List   Diagnosis Date Noted  . Vertigo 10/14/2014  . 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 Hypertension 10/14/2014  . CVA (cerebral infarction) 10/14/2014  . Impingement syndrome of left shoulder 08/02/2014  . 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 Abdominal pain 06/13/2014  . Rectal bleeding 03/13/2014  . Diabetes type 2, controlled 02/18/2014  . Hyperlipidemia 02/18/2014  . Diabetic peripheral neuropathy 03/28/2013  . Irreducible incisional hernia 11/11/2010   Past Medical History:  Past Medical History  Diagnosis Date  . Night sweats     every once in a while  . Hyperlipidemia   . Diabetes mellitus   . Hernia, inguinal   . Dizziness   . Chronic headaches   . Arthritis   . Neuropathy of lower extremity   . Diabetic peripheral neuropathy 03/28/2013  . Diverticulosis   . Hypertension   . Complication of anesthesia     bleed after last shoulder-had to stay overnight  . Impingement syndrome of left shoulder 08/02/2014  . 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 Multi-infarct state 10/14/2014   Past Surgical History:  Past Surgical History  Procedure Laterality Date  . Inguinal hernia repair Bilateral   . Cervical disc surgery    . Wrist surgery Right     fusion  . Shoulder arthroscopy Right     1  . Knee arthroscopy Right   . Colon surgery  2003    1/3 removed for diverticulitis  . Colonoscopy    . Umbilical hernia repair      with other hernia repair with mesh  . Shoulder arthroscopy Left 08/02/2014    Procedure: LEFT SHOULDER SCOPE DEBRIDEMENT/ACROMIOPLASTY;  Surgeon: [name], MD;  Location: [facility];  Service: Orthopedics;  Laterality: Left;  ANESTHESIA: GENERAL, PRE/POST OP SCALENE   HPI:   Pt is a 54 year old male with a history of diabetes, hypertension, and hyperlipidemia who presents with an acute onset of dizziness, slurred speech, and diplopia. Pt with known mulitple chronic infarcts in postrerior cerebellar hemispheres, lacunar infarct of left basal ganglia, and small chronic coritcally based infarcts throughout cerebellar hemispheres, left basal ganglia, and right frontal lobe. MRI of brain and CT of head show no acute abnormalities.    Assessment / Plan / Recommendation Clinical Impression  Pts cognitive abilities appearing with in functional limits. MOCA cognitive assessment administered with patient scoring 26/30 (normal limits). Errors during testing include decreased recall of novel information and decreased self monitoring. CT of head and MRI of brain negative for acute abnormalities with known chronic infarcts. Pt with denies acute changes in recall and thinking skills. No further ST follow up indicated.       SLP Assessment  Patient does not need any further Speech Lanaguage Pathology Services    Follow Up Recommendations       Frequency and Duration        Pertinent Vitals/Pain Pain Assessment: No/denies pain   SLP Goals     SLP Evaluation Prior Functioning  Cognitive/Linguistic Baseline: Within functional limits Type of Home: House  Lives With: Spouse;Daughter Available Help at Discharge: Family Vocation: Full time employment   Cognition  Overall Cognitive Status: Within Functional Limits for tasks assessed Arousal/Alertness: Awake/alert Orientation Level: Oriented X4 Attention: Focused;Sustained Focused Attention: Appears intact Sustained Attention: Appears intact Memory:  Appears intact Awareness: Appears intact Problem Solving: Appears intact Executive Function: Self Monitoring;Reasoning Reasoning: Appears intact Self Monitoring: Appears intact Behaviors: Lability Safety/Judgment: Appears intact    Comprehension  Auditory  Comprehension Overall Auditory Comprehension: Appears within functional limits for tasks assessed Yes/No Questions: Within Functional Limits Commands: Within Functional Limits Visual Recognition/Discrimination Discrimination: Within Function Limits Reading Comprehension Reading Status: Within funtional limits    Expression Verbal Expression Overall Verbal Expression: Appears within functional limits for tasks assessed Written Expression Dominant Hand: Right Written Expression: Within Functional Limits   Oral / Motor Oral Motor/Sensory Function Overall Oral Motor/Sensory Function: Appears within functional limits for tasks assessed Labial ROM: Within Functional Limits Labial Symmetry: Within Functional Limits Labial Strength: Within Functional Limits Facial Symmetry: Within Functional Limits Motor Speech Overall Motor Speech: Appears within functional limits for tasks assessed Respiration: Within functional limits Phonation: Normal   GO    Arvil Chaco MA, CCC-SLP Acute Care Speech Language Pathologist    Levi Aland 10/16/2014, 3:33 PM

## 2014-10-16 NOTE — Discharge Summary (Signed)
Physician Discharge Summary  Travis [name] YDX:[id number] DOB: 23-Sep-1960 DOA: 10/14/2014  PCP: [name], MD  Admit date: 10/14/2014 Discharge date: 10/16/2014  Time spent: 40 minutes  Recommendations for Outpatient Follow-up:  1. Follow up with neurology in 4-6 weeks 2. Outpatient physical therapy has been ordered for vestibular training  Discharge Diagnoses:  Active Problems:   Diabetes type 2, controlled   Hyperlipidemia   Vertigo   Multi-infarct state   Hypertension   CVA (cerebral infarction)   Cerebral infarction, chronic   Dizzy   Discharge Condition: stable  Diet recommendation: low salt, low carb  Filed Weights   10/14/14 1359 10/14/14 1953  Weight: 83.915 kg (185 lb) 83.6 kg (184 lb 4.9 oz)    History of present illness:  This patient presents to the hospital with acute onset of dizziness. He is finding difficult to walk into the duodenum to the left side. There was concern for underlying CVA and therefore he was admitted for further workup.  Hospital Course:  MRI brain was negative for any acute CVA. He was seen by neurology who felt that his dizziness was possibly related to complex partial seizures. This patient has a history of prior infarcts and also had head injuries which would increase his risk of seizures. Other workup was found to be unremarkable. Patient was started on Keppra twice a day . EEG was done that was a normal study. He will follow up with neurology in 4-6 weeks. Other possibilities include benign positional vertigo. He was started on meclizine and will follow-up with vestibular therapy as an outpatient. Patient is feeling improved. There does not appear to be any further inpatient workup that is needed at this time. He'll follow up with neurology as an outpatient. Other medical problems as his diabetes remained stable.  Procedures:  EEG: normal study  Consultations:  Neurology  Discharge Exam: Filed Vitals:   10/16/14 1353  BP:  136/86  Pulse: 68  Temp: 98.6 F (37 C)  Resp: 18    General: NAD Cardiovascular: S1, s2 RRR Respiratory: CTA B  Discharge Instructions   Discharge Instructions    Diet - low sodium heart healthy    Complete by:  As directed      Increase activity slowly    Complete by:  As directed           Discharge Medication List as of 10/16/2014  4:07 PM    START taking these medications   Details  levETIRAcetam (KEPPRA) 250 MG tablet Take 1 tablet (250 mg total) by mouth 2 (two) times daily., Starting 10/16/2014, Until Discontinued, Normal    meclizine (ANTIVERT) 25 MG tablet Take 1 tablet (25 mg total) by mouth 3 (three) times daily as needed for dizziness., Starting 10/16/2014, Until Discontinued, Normal      CONTINUE these medications which have CHANGED   Details  aspirin 81 MG tablet Take 2 tablets (162 mg total) by mouth daily., Starting 10/16/2014, Until Discontinued, OTC      CONTINUE these medications which have NOT CHANGED   Details  baclofen (LIORESAL) 10 MG tablet Take 1 tablet (10 mg total) by mouth 3 (three) times daily. As needed for muscle spasm, Starting 08/02/2014, Until Discontinued, Normal    Cholecalciferol (VITAMIN D) 400 UNITS capsule Take 400 Units by mouth daily.  , Until Discontinued, Historical Med    Cyanocobalamin (VITAMIN B12 PO) Take by mouth daily.  , Until Discontinued, Historical Med    glyBURIDE micronized (GLYNASE) 6 MG tablet Take  1 tablet (6 mg total) by mouth 2 (two) times daily., Starting 06/13/2014, Until Discontinued, Normal    lisinopril (PRINIVIL,ZESTRIL) 2.5 MG tablet Take 1 tablet (2.5 mg total) by mouth every morning., Starting 06/13/2014, Until Discontinued, Normal    metFORMIN (GLUCOPHAGE) 500 MG tablet Take 1 tablet (500 mg total) by mouth 2 (two) times daily with a meal., Starting 06/13/2014, Until Discontinued, Normal    oxyCODONE-acetaminophen (PERCOCET) 10-325 MG per tablet Take 1-2 tablets by mouth every 6 (six) hours as needed for  pain. MAXIMUM TOTAL ACETAMINOPHEN DOSE IS 4000 MG PER DAY, Starting 08/02/2014, Until Discontinued, Normal    pravastatin (PRAVACHOL) 20 MG tablet Take 1 tablet (20 mg total) by mouth every morning., Starting 06/13/2014, Until Discontinued, Normal    pregabalin (LYRICA) 100 MG capsule Take 1 capsule (100 mg total) by mouth 3 (three) times daily., Starting 06/13/2014, Until Discontinued, Print      STOP taking these medications     meloxicam (MOBIC) 15 MG tablet      LORazepam (ATIVAN) 1 MG tablet      ondansetron (ZOFRAN) 4 MG tablet      sennosides-docusate sodium (SENOKOT-S) 8.6-50 MG tablet      valACYclovir (VALTREX) 1000 MG tablet        No Known Allergies Follow-up Information    Follow up with [facility], KOFI, MD. Schedule an appointment as soon as possible for a visit in 4 weeks.   Specialty:  Neurology   Contact information:   [address] [place] [address] [phone]        The results of significant diagnostics from this hospitalization (including imaging, microbiology, ancillary and laboratory) are listed below for reference.    Significant Diagnostic Studies: Ct Head Wo Contrast  10/14/2014   CLINICAL DATA:  Sudden onset left-sided weakness with dizziness and leaning to the left. Symptoms began 1 hr ago. Headache.  EXAM: CT HEAD WITHOUT CONTRAST  TECHNIQUE: Contiguous axial images were obtained from the base of the skull through the vertex without intravenous contrast.  COMPARISON:  MR brain 01/01/2014 and CT head 12/27/2005.  FINDINGS: No evidence of an acute infarct, acute hemorrhage, mass lesion, mass effect or hydrocephalus. Remote infarcts involving the cerebellar hemispheres and left caudate. Minimal periventricular low attenuation. No air-fluid levels in the paranasal sinuses or mastoid air cells.  IMPRESSION: 1. No acute intracranial abnormality. 2. Remote left caudate and cerebellar infarcts. 3. Minimal chronic microvascular white matter ischemic  changes.   Electronically Signed   By: Lorin Picket M.D.   On: 10/14/2014 14:24   Mr Jodene Nam Head Wo Contrast  10/15/2014   CLINICAL DATA:  Dizziness  EXAM: MRA HEAD WITHOUT CONTRAST  TECHNIQUE: Angiographic images of the Circle of Willis were obtained using MRA technique without intravenous contrast.  COMPARISON:  MRI head 10/14/2014  FINDINGS: Both vertebral arteries widely patent to the basilar. Right vertebral dominant. PICA patent bilaterally. Right AICA patent. Superior cerebellar and posterior cerebral arteries widely patent bilaterally. Basilar artery appears normal  Internal carotid artery appears normal bilaterally. Anterior and middle cerebral arteries are normal  Negative for cerebral aneurysm.  IMPRESSION: Normal   Electronically Signed   By: Franchot Gallo M.D.   On: 10/15/2014 17:22   Mr Brain Wo Contrast  10/14/2014   CLINICAL DATA:  Dizziness, confusion, and ataxia. Slurred speech. Possible stroke.  EXAM: MRI HEAD WITHOUT CONTRAST  TECHNIQUE: Multiplanar, multiecho pulse sequences of the brain and surrounding structures were obtained without intravenous contrast.  COMPARISON:  Head  CT 10/14/2014 and MRI 01/01/2014  FINDINGS: There is no evidence of acute infarct, intracranial hemorrhage, mass, midline shift, or extra-axial fluid collection. Overall cerebral volume is within normal limits for age. Focus of chronic microhemorrhage in the left gyrus rectus on the prior MRI is not clearly seen on the current study. Small, chronic infarcts are again seen throughout both cerebellar hemispheres, left basal ganglia, and right frontal lobe at the anterior aspect of the operculum. There is mild ex vacuo dilatation of the frontal horn of left lateral ventricle.  Orbits are unremarkable. Minimal left maxillary sinus and left ethmoid air cell mucosal thickening is noted. Mastoid air cells are clear. Major intracranial vascular flow voids are preserved.  IMPRESSION: 1. No acute intracranial abnormality. 2.  Chronic infarcts as above.   Electronically Signed   By: Logan Bores   On: 10/14/2014 15:55   US Carotid Bilateral  10/15/2014   CLINICAL DATA:  54 year old male with symptoms of transient ischemic attack  EXAM: BILATERAL CAROTID DUPLEX ULTRASOUND  TECHNIQUE: Pearline Cables scale imaging, color Doppler and duplex ultrasound were performed of bilateral carotid and vertebral arteries in the neck.  COMPARISON:  Brain MRI 10/14/2014  FINDINGS: Criteria: Quantification of carotid stenosis is based on velocity parameters that correlate the residual internal carotid diameter with NASCET-based stenosis levels, using the diameter of the distal internal carotid lumen as the denominator for stenosis measurement.  The following velocity measurements were obtained:  RIGHT  ICA:  67/26 cm/sec  CCA:  810/17 cm/sec  SYSTOLIC ICA/CCA RATIO:  0.6  DIASTOLIC ICA/CCA RATIO:  0.9  ECA:  103 cm/sec  LEFT  ICA:  65/21 cm/sec  CCA:  510/25 cm/sec  SYSTOLIC ICA/CCA RATIO:  0.6  DIASTOLIC ICA/CCA RATIO:  0.7  ECA:  65 cm/sec  RIGHT CAROTID ARTERY: No significant atherosclerotic plaque or evidence of stenosis.  RIGHT VERTEBRAL ARTERY:  Patent with normal antegrade flow.  LEFT CAROTID ARTERY: No significant atherosclerotic plaque or evidence of stenosis pair  LEFT VERTEBRAL ARTERY:  Patent with normal antegrade flow.  IMPRESSION: Negative bilateral carotid duplex study. No evidence of significant atherosclerotic plaque or stenosis.  Signed,  Criselda Peaches, MD  [facility]  [facility]   Electronically Signed   By: Jacqulynn Cadet M.D.   On: 10/15/2014 10:14    Microbiology: No results found for this or any previous visit (from the past 240 hour(s)).   Labs: Basic Metabolic Panel:  Recent Labs Lab 10/14/14 1402  NA 142  K 4.1  CL 112*  CO2 24  GLUCOSE 147*  BUN 18  CREATININE 0.88  CALCIUM 8.9   Liver Function Tests:  Recent Labs Lab 10/14/14 1402  AST 20  ALT 15*   ALKPHOS 59  BILITOT 0.6  PROT 6.8  ALBUMIN 4.2   No results for input(s): LIPASE, AMYLASE in the last 168 hours. No results for input(s): AMMONIA in the last 168 hours. CBC:  Recent Labs Lab 10/14/14 1402  WBC 7.3  NEUTROABS 4.5  HGB 14.4  HCT 41.0  MCV 93.4  PLT 170   Cardiac Enzymes: No results for input(s): CKTOTAL, CKMB, CKMBINDEX, TROPONINI in the last 168 hours. BNP: BNP (last 3 results) No results for input(s): BNP in the last 8760 hours.  ProBNP (last 3 results) No results for input(s): PROBNP in the last 8760 hours.  CBG:  Recent Labs Lab 10/15/14 1643 10/15/14 2230 10/16/14 0719 10/16/14 1122 10/16/14 1641  GLUCAP 93 130* 130* 164* 100*  Signed:  MEMON,JEHANZEB  [facility] 10/16/2014, 7:31 PM

## 2014-10-16 NOTE — Progress Notes (Signed)
Patient with orders to be discharge home. Keppra given prior to discharge per Dr. Roderic Palau request. Discharge instructions given, patient verbalized understanding. Patient stable. Patient left with spouse in private vehicle.

## 2014-10-17 ENCOUNTER — Ambulatory Visit (HOSPITAL_COMMUNITY): Payer: 59

## 2014-10-17 LAB — RPR: RPR: NONREACTIVE

## 2014-10-17 LAB — HIV ANTIBODY (ROUTINE TESTING W REFLEX): HIV Screen 4th Generation wRfx: NONREACTIVE

## 2014-10-18 ENCOUNTER — Telehealth: Payer: Self-pay | Admitting: Family Medicine

## 2014-10-18 ENCOUNTER — Ambulatory Visit (HOSPITAL_COMMUNITY): Payer: 59 | Admitting: Physical Therapy

## 2014-10-18 ENCOUNTER — Other Ambulatory Visit: Payer: Self-pay

## 2014-10-18 LAB — HOMOCYSTEINE: HOMOCYSTEINE-NORM: 19.1 umol/L — AB (ref 0.0–15.0)

## 2014-10-18 MED ORDER — METFORMIN HCL 500 MG PO TABS: 500.0000 mg | ORAL_TABLET | Freq: Two times a day (BID) | ORAL | Status: DC

## 2014-10-18 MED ORDER — METFORMIN HCL 500 MG PO TABS: 1000.0000 mg | ORAL_TABLET | Freq: Two times a day (BID) | ORAL | Status: DC

## 2014-10-18 NOTE — Telephone Encounter (Signed)
Notified [name] that prescription was resent to pharmacy again for 2 tab BID. [name] stated that she wants Dr. Nicki Reaper to see this message on Tuesday when he returns to make sure he is aware/agrees with this. Also wants to know if Dr. Nicki Reaper wants to see patient before September because he recently had a hospital stay for stroke.

## 2014-10-18 NOTE — Telephone Encounter (Signed)
Pt's completely out of his Metformin  Pt is not sure of dose  Pt states that some time back Dr. Nicki Reaper told pt to take 2 (500mg ) pills twice a day.    Order on med list and in last couple office notes concerning diabetes states 1 tablet twice daily  Please clarify.  If this has to wait for Dr. Nicki Reaper to answer can we order enough for pt until then?      [facility]

## 2014-10-18 NOTE — Telephone Encounter (Signed)
Notified [name] (wife) that we will send in a refill of his Metformin exactly how it is written according to his med list to [facility]. Reviewed last office note and there is no documentation stating that Dr. Nicki Reaper wanted patient to start taking Metformin 2 tabs BID. We will forward this message to Dr. Nicki Reaper and when he returns next week we will call her back with his response. [name] verbalized understanding.

## 2014-10-18 NOTE — Telephone Encounter (Signed)
Actually 2 11 note says will incr metformin but not how much, normally would agree but since pt completely out rec writing it for two po bid and pt to take this til he sees [name] (hopefully soon)

## 2014-10-21 ENCOUNTER — Telehealth (HOSPITAL_COMMUNITY): Payer: Self-pay | Admitting: Physical Therapy

## 2014-10-21 NOTE — Telephone Encounter (Signed)
Called to remind pt of apptment and was hung up on  10/21/14 NF

## 2014-10-21 NOTE — Telephone Encounter (Signed)
I would recommend that the patient be on metformin 500 mg, 2 tablets twice daily. I also recommend this patient follow-up in approximately 2-3 weeks. I tell [name] that I reviewed over the hospital notes and the MRI/MRA. I agree with what the neurologist is doing currently. I also agree that they should follow-up with neurology as recommended upon discharge in 4-6 weeks but I would like to see him in early July. Thank you

## 2014-10-22 ENCOUNTER — Ambulatory Visit (HOSPITAL_COMMUNITY): Payer: 59

## 2014-10-22 ENCOUNTER — Ambulatory Visit (HOSPITAL_COMMUNITY): Payer: 59 | Admitting: Physical Therapy

## 2014-10-22 NOTE — Telephone Encounter (Signed)
Patient verbalized understanding of taking Metformin 500MG , 2 tablets twice a day. Patient also verbalized understanding of following up with Dr.Scott early July and following up with Neurologist within 4-6 weeks post discharge. Patient states that he will get his wife [name] to call back and verify due to the fact that he has trouble remembering things. He also stated that he will have her schedule his appointment with Dr.Scott.

## 2014-10-23 ENCOUNTER — Telehealth: Payer: Self-pay | Admitting: Family Medicine

## 2014-10-23 NOTE — Telephone Encounter (Signed)
It would be fine to go ahead and give him a work note for this requested length.

## 2014-10-23 NOTE — Telephone Encounter (Signed)
Pt's wife called to ask if we can give patient a work excuse.  He was inpatient last week from Monday until Wednesday for a mini stroke.  Doc at hospital wrote work note for pt to RTW today but he as pretty dizzy and didn't go to work today.  Would like a work excuse from 10/23/2014 through 10/28/2014 which is when he is scheduled to see Dr. Merlene Laughter.  Ok to give work note or NTBS?  Please call wife [name] Curator at [facility] [employer] [facility]) with suggestions  Wk# [phone] or her cell# [phone]

## 2014-10-24 ENCOUNTER — Ambulatory Visit (HOSPITAL_COMMUNITY): Payer: 59

## 2014-10-24 ENCOUNTER — Encounter (HOSPITAL_COMMUNITY): Payer: Self-pay

## 2014-10-24 ENCOUNTER — Encounter: Payer: Self-pay | Admitting: Family Medicine

## 2014-10-24 DIAGNOSIS — R29898 Other symptoms and signs involving the musculoskeletal system: Secondary | ICD-10-CM

## 2014-10-24 DIAGNOSIS — Z4789 Encounter for other orthopedic aftercare: Secondary | ICD-10-CM | POA: Diagnosis not present

## 2014-10-24 DIAGNOSIS — M25512 Pain in left shoulder: Secondary | ICD-10-CM | POA: Diagnosis not present

## 2014-10-24 DIAGNOSIS — M25612 Stiffness of left shoulder, not elsewhere classified: Secondary | ICD-10-CM | POA: Diagnosis not present

## 2014-10-24 DIAGNOSIS — E119 Type 2 diabetes mellitus without complications: Secondary | ICD-10-CM | POA: Diagnosis not present

## 2014-10-24 DIAGNOSIS — M629 Disorder of muscle, unspecified: Secondary | ICD-10-CM

## 2014-10-24 DIAGNOSIS — M6289 Other specified disorders of muscle: Secondary | ICD-10-CM

## 2014-10-24 DIAGNOSIS — Z9889 Other specified postprocedural states: Secondary | ICD-10-CM | POA: Diagnosis not present

## 2014-10-24 DIAGNOSIS — I10 Essential (primary) hypertension: Secondary | ICD-10-CM | POA: Diagnosis not present

## 2014-10-24 NOTE — Therapy (Signed)
[facility] [place], [place], [address] Phone: [phone]   Fax:  [phone]  Occupational Therapy Treatment & Reassessment  Patient Details  Name: Travis [name] MRN: [id number] Date of Birth: 08-Jul-1960 Referring Provider:  [name], MD  Encounter Date: 10/24/2014      OT End of Session - 10/24/14 1657    Visit Number 18   Number of Visits 24   Date for OT Re-Evaluation 12/23/14  mini reassessment: 11/21/14   Authorization Type UMR no visit limit   OT Start Time 1614   OT Stop Time 1652   OT Time Calculation (min) 38 min   Activity Tolerance Patient tolerated treatment well   Behavior During Therapy [facility] for tasks assessed/performed      Past Medical History  Diagnosis Date  . Night sweats     every once in a while  . Hyperlipidemia   . Diabetes mellitus   . Hernia, inguinal   . Dizziness   . Chronic headaches   . Arthritis   . Neuropathy of lower extremity   . Diabetic peripheral neuropathy 03/28/2013  . Diverticulosis   . Hypertension   . Complication of anesthesia     bleed after last shoulder-had to stay overnight  . Impingement syndrome of left shoulder 08/02/2014  . 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 Multi-infarct state 10/14/2014    Past Surgical History  Procedure Laterality Date  . Inguinal hernia repair Bilateral   . Cervical disc surgery    . Wrist surgery Right     fusion  . Shoulder arthroscopy Right     1  . Knee arthroscopy Right   . Colon surgery  2003    1/3 removed for diverticulitis  . Colonoscopy    . Umbilical hernia repair      with other hernia repair with mesh  . Shoulder arthroscopy Left 08/02/2014    Procedure: LEFT SHOULDER SCOPE DEBRIDEMENT/ACROMIOPLASTY;  Surgeon: [name], MD;  Location: [place] [facility];  Service: Orthopedics;  Laterality: Left;  ANESTHESIA: GENERAL, PRE/POST OP SCALENE    There were no vitals filed for this  visit.  Visit Diagnosis:  Shoulder weakness - Plan: Ot plan of care cert/re-cert  Tight fascia - Plan: Ot plan of care cert/re-cert  Shoulder stiffness, left - Plan: Ot plan of care cert/re-cert          [facility] OT Assessment - 10/24/14 1617    Assessment   Diagnosis Left shoulder arthroscopy   Precautions   Precautions Shoulder   Type of Shoulder Precautions PROM through 4/15 then progressing as tolerated   AROM   Overall AROM Comments assessed in standing, ER/IR with shoulder abducted    AROM Assessment Site Shoulder   Right/Left Shoulder Left   Left Shoulder Flexion 150 Degrees  last progress note: 142   Left Shoulder ABduction 155 Degrees  last progress note: 149   Left Shoulder Internal Rotation 75 Degrees  last progress note: 55   Left Shoulder External Rotation 75 Degrees  last progress note: 75   Strength   Overall Strength Comments Assessed seated. IR/ER abducted   Strength Assessment Site Shoulder   Right/Left Shoulder Left   Left Shoulder Flexion 4-/5  last progress note: 4-/5   Left Shoulder Extension 4-/5  last progress note: 4-/5   Left Shoulder ABduction 4-/5  last progress note: 4-/5   Left Shoulder Internal Rotation 4/5  last progress note: 4/5  OT Treatments/Exercises (OP) - 10/24/14 1626    Exercises   Exercises Shoulder   Shoulder Exercises: Supine   Protraction PROM;5 reps;Strengthening;12 reps   Protraction Weight (lbs) 2   Horizontal ABduction PROM;5 reps;Strengthening;12 reps   Horizontal ABduction Weight (lbs) 2   External Rotation PROM;5 reps;Strengthening;12 reps   External Rotation Weight (lbs) 2   Internal Rotation PROM;5 reps;Strengthening;12 reps   Internal Rotation Weight (lbs) 2   Flexion PROM;5 reps;Strengthening;12 reps   Shoulder Flexion Weight (lbs) 2   ABduction PROM;5 reps;Strengthening;12 reps   Shoulder ABduction Weight (lbs) 2   Manual Therapy   Manual Therapy Myofascial release    Myofascial Release Myofascial release and muscle energy technique to left anterior deltoid  to relax tone and muscle spasm and improve range of motion.                   OT Short Term Goals - 10/24/14 1644    OT SHORT TERM GOAL #1   Title Patient will be educated on a HEP.   OT SHORT TERM GOAL #2   Title Patient will decrease LUE pain to a 4/10 or less during daily tasks.    Status Achieved   OT SHORT TERM GOAL #3   Title Patient will increase PROM to [facility] to increase ability to complete dressing tasks.   OT SHORT TERM GOAL #4   Title Patient will increase LUE strength to 3/5 to increase ability to reach overhead.   OT SHORT TERM GOAL #5   Title Patient will decrease fascial restrctions from mod to min amount.           OT Long Term Goals - 10/24/14 1644    OT LONG TERM GOAL #1   Title Patient will return to highest level of independence with all daily, leisure, and work tasks.    Time 8   Period Weeks   Status On-going   OT LONG TERM GOAL #2   Title Patient will decrease pain to 2/10 or less during daily tasks.    Time 8   Period Weeks   Status Achieved   OT LONG TERM GOAL #3   Title Patient will increase AROM of LUE to WNL to increase ability to complete overhead tasks at work.    Time 8   Period Weeks   Status Achieved   OT LONG TERM GOAL #4   Title Patient will increase LUE strength to 5/5 to increase ability to change and lift tires at work.    Time 8   Period Weeks   Status On-going   OT LONG TERM GOAL #5   Title Patient will decrease fascial restrictions to trace amount.    Time 8   Period Weeks   Status Achieved               Plan - 10/24/14 1658    Clinical Impression Statement A: Pt presents today for a reassessment. Pt was previously hospitalized due to a mini stroke on 10/14/14 which caused vertigo and left weakness. AROM measurements have increased since last progress note. Strength remains the same at 4-/5. Patient reports overall body  aches since TIA. Patient has met all STGs and  3/5 LTGs. Patient continues to have deficits in IR/ER and strength. Recommend patient continue therapy for 4 more weeks to continue working on these deficits.    Plan P: D/C MFR. Add power tower. Add overhead endurance exercises. Increase back to 3# as able.  Problem List Patient Active Problem List   Diagnosis Date Noted  . Cerebral infarction, chronic   . Dizzy   . Vertigo 10/14/2014  . 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 Hypertension 10/14/2014  . CVA (cerebral infarction) 10/14/2014  . Impingement syndrome of left shoulder 08/02/2014  . 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 Abdominal pain 06/13/2014  . Rectal bleeding 03/13/2014  . Diabetes type 2, controlled 02/18/2014  . Hyperlipidemia 02/18/2014  . Diabetic peripheral neuropathy 03/28/2013  . Irreducible incisional hernia 11/11/2010    Ailene Ravel, OTR/L,CBIS  [phone]  10/24/2014, 5:13 PM  [facility] [address] [place], [place], [address] Phone: [phone]   Fax:  [phone]

## 2014-10-24 NOTE — Telephone Encounter (Signed)
Note done, pt aware up front for pick up, scheduled OV foro early July per Dr. Bary Leriche request in previous phone message

## 2014-10-29 ENCOUNTER — Encounter (HOSPITAL_COMMUNITY): Payer: Self-pay

## 2014-10-29 ENCOUNTER — Ambulatory Visit (HOSPITAL_COMMUNITY): Payer: 59

## 2014-10-29 DIAGNOSIS — M25512 Pain in left shoulder: Secondary | ICD-10-CM

## 2014-10-29 DIAGNOSIS — M25612 Stiffness of left shoulder, not elsewhere classified: Secondary | ICD-10-CM

## 2014-10-29 DIAGNOSIS — Z4789 Encounter for other orthopedic aftercare: Secondary | ICD-10-CM | POA: Diagnosis not present

## 2014-10-29 DIAGNOSIS — R29898 Other symptoms and signs involving the musculoskeletal system: Secondary | ICD-10-CM

## 2014-10-29 NOTE — Therapy (Signed)
[facility] [place], [place], [address] Phone: [phone]   Fax:  [phone]  Occupational Therapy Treatment  Patient Details  Name: Travis [name] MRN: [id number] Date of Birth: [birth year] Referring Provider:  Kathyrn Drown, MD  Encounter Date: 10/29/2014      OT End of Session - 10/29/14 1658    Visit Number 19   Number of Visits 24   Date for OT Re-Evaluation 12/23/14  mini reassessment: 11/21/14   Authorization Type UMR no visit limit   OT Start Time 1615   OT Stop Time 1700   OT Time Calculation (min) 45 min   Activity Tolerance Patient tolerated treatment well   Behavior During Therapy [facility] for tasks assessed/performed      Past Medical History  Diagnosis Date  . Night sweats     every once in a while  . Hyperlipidemia   . Diabetes mellitus   . Hernia, inguinal   . Dizziness   . Chronic headaches   . Arthritis   . Neuropathy of lower extremity   . Diabetic peripheral neuropathy 03/28/2013  . Diverticulosis   . Hypertension   . Complication of anesthesia     bleed after last shoulder-had to stay overnight  . Impingement syndrome of left shoulder 08/02/2014  . 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 Multi-infarct state 10/14/2014    Past Surgical History  Procedure Laterality Date  . Inguinal hernia repair Bilateral   . Cervical disc surgery    . Wrist surgery Right     fusion  . Shoulder arthroscopy Right     1  . Knee arthroscopy Right   . Colon surgery  2003    1/3 removed for diverticulitis  . Colonoscopy    . Umbilical hernia repair      with other hernia repair with mesh  . Shoulder arthroscopy Left 08/02/2014    Procedure: LEFT SHOULDER SCOPE DEBRIDEMENT/ACROMIOPLASTY;  Surgeon: [name], MD;  Location: [facility];  Service: Orthopedics;  Laterality: Left;  ANESTHESIA: GENERAL, PRE/POST OP SCALENE    There were no vitals filed for this visit.  Visit  Diagnosis:  Shoulder weakness  Pain in joint, shoulder region, left  Shoulder stiffness, left      Subjective Assessment - 10/29/14 1616    Subjective  S: I still feel wore out. It's so hard to get going in the morning.    Currently in Pain? Yes   Pain Score 2    Pain Location Shoulder   Pain Orientation Left   Pain Descriptors / Indicators Burning   Pain Type Acute pain                      OT Treatments/Exercises (OP) - 10/29/14 1621    Exercises   Exercises Shoulder   Shoulder Exercises: Supine   Protraction PROM;5 reps;Strengthening;12 reps   Protraction Weight (lbs) 3   Horizontal ABduction PROM;5 reps;Strengthening;12 reps   Horizontal ABduction Weight (lbs) 3   External Rotation PROM;5 reps;Strengthening;12 reps   External Rotation Weight (lbs) 3   Internal Rotation PROM;5 reps;Strengthening;12 reps   Internal Rotation Weight (lbs) 3   Flexion PROM;5 reps;Strengthening;12 reps   Shoulder Flexion Weight (lbs) 3   ABduction PROM;5 reps;Strengthening;12 reps   Shoulder ABduction Weight (lbs) 3   Shoulder Exercises: Prone   Retraction Strengthening;15 reps   Retraction Weight (lbs) 3   Flexion Strengthening;15 reps   Flexion Weight (  lbs) 3   Extension Strengthening;15 reps   Extension Weight (lbs) 3   External Rotation Strengthening;15 reps   External Rotation Weight (lbs) 3   Horizontal ABduction 1 Strengthening;15 reps   Horizontal ABduction 1 Weight (lbs) 3   Shoulder Exercises: Sidelying   External Rotation Strengthening;12 reps   External Rotation Weight (lbs) 3   Internal Rotation Strengthening;12 reps   Internal Rotation Weight (lbs) 3   Flexion Strengthening;12 reps   Flexion Weight (lbs) 3   ABduction Strengthening;12 reps   ABduction Weight (lbs) 3   Other Sidelying Exercises shoulder press to ceiling; 12X; 3#   Shoulder Exercises: ROM/Strengthening   UBE (Upper Arm Bike) Level 2 3' forward 3' reverse   Other ROM/Strengthening  Exercises Total Gym: 12X 11 degrees: retraction, row, extension, IR. 7 degrees: horizontal adduction, ER. 5 degrees: horizontal abduction                   OT Short Term Goals - 10/29/14 1701    OT SHORT TERM GOAL #1   Title Patient will be educated on a HEP.   OT SHORT TERM GOAL #2   Title Patient will decrease LUE pain to a 4/10 or less during daily tasks.    OT SHORT TERM GOAL #3   Title Patient will increase PROM to [facility] to increase ability to complete dressing tasks.   OT SHORT TERM GOAL #4   Title Patient will increase LUE strength to 3/5 to increase ability to reach overhead.   OT SHORT TERM GOAL #5   Title Patient will decrease fascial restrctions from mod to min amount.           OT Long Term Goals - 10/29/14 1701    OT LONG TERM GOAL #1   Title Patient will return to highest level of independence with all daily, leisure, and work tasks.    Time 8   Period Weeks   Status On-going   OT LONG TERM GOAL #2   Title Patient will decrease pain to 2/10 or less during daily tasks.    Time 8   Period Weeks   OT LONG TERM GOAL #3   Title Patient will increase AROM of LUE to WNL to increase ability to complete overhead tasks at work.    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    Time 8   Period Weeks   Status On-going   OT LONG TERM GOAL #5   Title Patient will decrease fascial restrictions to trace amount.    Time 8   Period Weeks               Plan - 10/29/14 1658    Clinical Impression Statement A: Added Total gym, and increased back to 3# hand weight for strengthening exercises. patient tolerated well with muscle fatigue noted.    Plan P: Add ball on the wall. Add overhead lacing with weight.        Problem List Patient Active Problem List   Diagnosis Date Noted  . Cerebral infarction, chronic   . Dizzy   . Vertigo 10/14/2014  .  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 Hypertension 10/14/2014  . CVA (cerebral infarction) 10/14/2014  . Impingement syndrome of left shoulder 08/02/2014  . 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 Abdominal pain 06/13/2014  . Rectal  bleeding 03/13/2014  . Diabetes type 2, controlled 02/18/2014  . Hyperlipidemia 02/18/2014  . Diabetic peripheral neuropathy 03/28/2013  . Irreducible incisional hernia 11/11/2010    Ailene Ravel, OTR/L,CBIS  [phone]  10/29/2014, 5:02 PM  [facility] [address] [place], [place], [address] Phone: [phone]   Fax:  [phone]

## 2014-10-31 ENCOUNTER — Encounter (HOSPITAL_COMMUNITY): Payer: 59 | Admitting: Occupational Therapy

## 2014-10-31 ENCOUNTER — Ambulatory Visit (HOSPITAL_COMMUNITY): Payer: 59 | Admitting: Physical Therapy

## 2014-11-06 ENCOUNTER — Encounter: Payer: Self-pay | Admitting: Family Medicine

## 2014-11-06 ENCOUNTER — Ambulatory Visit (INDEPENDENT_AMBULATORY_CARE_PROVIDER_SITE_OTHER): Payer: 59 | Admitting: Family Medicine

## 2014-11-06 VITALS — BP 132/88 | Ht 73.0 in | Wt 191.1 lb

## 2014-11-06 DIAGNOSIS — E119 Type 2 diabetes mellitus without complications: Secondary | ICD-10-CM

## 2014-11-06 DIAGNOSIS — R42 Dizziness and giddiness: Secondary | ICD-10-CM | POA: Diagnosis not present

## 2014-11-06 DIAGNOSIS — E785 Hyperlipidemia, unspecified: Secondary | ICD-10-CM

## 2014-11-06 NOTE — Progress Notes (Signed)
   Subjective:    Patient ID: Travis [name], male    DOB: 10-06-1960, 54 y.o.   MRN: [id number]  Dizziness This is a new problem. The current episode started 1 to 4 weeks ago. The problem has been gradually worsening. Associated symptoms include diaphoresis and vertigo. Pertinent negatives include no abdominal pain, chest pain, congestion, coughing, fatigue or weakness. Treatments tried: Antivert. The treatment provided mild relief.   Patient states that he has some stress. Patient states no other concerns this visit.    Review of Systems  Constitutional: Positive for diaphoresis. Negative for activity change, appetite change and fatigue.  HENT: Negative for congestion.   Respiratory: Negative for cough.   Cardiovascular: Negative for chest pain.  Gastrointestinal: Negative for abdominal pain.  Endocrine: Negative for polydipsia and polyphagia.  Neurological: Positive for dizziness and vertigo. Negative for weakness.  Psychiatric/Behavioral: Negative for confusion.       Objective:   Physical Exam  Constitutional: He appears well-nourished. No distress.  Cardiovascular: Normal rate, regular rhythm and normal heart sounds.   No murmur heard. Pulmonary/Chest: Effort normal and breath sounds normal. No respiratory distress.  Musculoskeletal: He exhibits no edema.  Lymphadenopathy:    He has no cervical adenopathy.  Neurological: He is alert.  Psychiatric: His behavior is normal.  Vitals reviewed.         Assessment & Plan:  Dizziness-I find no evidence of vertigo on today's visit I feel that this is more neurologic. He had a normal EEG. He is with neurology in the next few weeks. He will be discussing with his wife whether he wants to follow-up locally or in [place]  I told him the best way to prevent having any type of strokes is keeping diabetes under control cholesterol under control blood pressure under control  Follow-up in mid September for A1c check up on  diabetes  Patient is a thin for procedure I don't feel his medicines or his condition will interfere with this if dentist once to take him off of aspirin 7 days before the procedure I think that would be fine

## 2014-11-07 ENCOUNTER — Encounter (HOSPITAL_COMMUNITY): Payer: 59

## 2014-11-12 ENCOUNTER — Ambulatory Visit (HOSPITAL_COMMUNITY): Payer: 59 | Attending: Internal Medicine | Admitting: Physical Therapy

## 2014-11-12 DIAGNOSIS — M436 Torticollis: Secondary | ICD-10-CM | POA: Insufficient documentation

## 2014-11-12 DIAGNOSIS — R2681 Unsteadiness on feet: Secondary | ICD-10-CM | POA: Insufficient documentation

## 2014-11-12 DIAGNOSIS — R42 Dizziness and giddiness: Secondary | ICD-10-CM | POA: Insufficient documentation

## 2014-11-12 DIAGNOSIS — R29898 Other symptoms and signs involving the musculoskeletal system: Secondary | ICD-10-CM | POA: Insufficient documentation

## 2014-11-12 NOTE — Therapy (Signed)
[place], [place], [address] Phone: [phone]   Fax:  [phone]  Physical Therapy Evaluation  Patient Details  Name: Travis [name] MRN: [id number] Date of Birth: [birth year] Referring Provider:  Kathie Dike, MD  Encounter Date: 11/12/2014      PT End of Session - 11/12/14 1803    Visit Number 1   Number of Visits 16   Date for PT Re-Evaluation 12/10/14   Authorization Type UMR   Authorization Time Period 11/12/14 to 01/13/15   PT Start Time 1620   PT Stop Time 1658   PT Time Calculation (min) 38 min   Activity Tolerance Patient tolerated treatment well   Behavior During Therapy [facility] for tasks assessed/performed      Past Medical History  Diagnosis Date  . Night sweats     every once in a while  . Hyperlipidemia   . Diabetes mellitus   . Hernia, inguinal   . Dizziness   . Chronic headaches   . Arthritis   . Neuropathy of lower extremity   . Diabetic peripheral neuropathy 03/28/2013  . Diverticulosis   . Hypertension   . Complication of anesthesia     bleed after last shoulder-had to stay overnight  . Impingement syndrome of left shoulder 08/02/2014  . 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 Multi-infarct state 10/14/2014    Past Surgical History  Procedure Laterality Date  . Inguinal hernia repair Bilateral   . Cervical disc surgery    . Wrist surgery Right     fusion  . Shoulder arthroscopy Right     1  . Knee arthroscopy Right   . Colon surgery  2003    1/3 removed for diverticulitis  . Colonoscopy    . Umbilical hernia repair      with other hernia repair with mesh  . Shoulder arthroscopy Left 08/02/2014    Procedure: LEFT SHOULDER SCOPE DEBRIDEMENT/ACROMIOPLASTY;  Surgeon: [name], MD;  Location: [place];  Service: Orthopedics;  Laterality: Left;  ANESTHESIA: GENERAL, PRE/POST OP SCALENE    There were no vitals filed for this visit.  Visit  Diagnosis:  Vertigo - Plan: PT plan of care cert/re-cert  Stiffness of cervical spine - Plan: PT plan of care cert/re-cert  Unsteadiness - Plan: PT plan of care cert/re-cert      Subjective Assessment - 11/12/14 1624    Subjective Severe dizziness about once a week, but does have slight small dizzy spells every day. Cannot think of anything that sets it off except quick motions.    Pertinent History Vertigo started about 2 years ago; just came on out of nowhere. Right now dizziness is 10/10. Seems like every time it sets off, something in neck seems to pop. Addressed shoulder first, now coming for vertigo. Had neck surgery about 5-6 years ago .   Currently in Pain? --  10/10 dizziness             [facility] PT Assessment - 11/12/14 0001    Assessment   Medical Diagnosis vertigo    Onset Date/Surgical Date --  2 years ago    Next MD Visit 22nd of September    Balance Screen   Has the patient fallen in the past 6 months No   Has the patient had a decrease in activity level because of a fear of falling?  Yes   Is the patient reluctant to leave their home because of a  fear of falling?  Yes   Prior Function   Level of Independence Independent;Independent with basic ADLs;Independent with gait;Independent with transfers   Vocation Full time employment   Observation/Other Assessments   Observations vertebral artery test negative but patient did experience cervical pain during test; some lightheadedness after sitting up    AROM   Cervical Flexion 54   Cervical Extension 40   Cervical - Right Side Bend 52   Cervical - Left Side Bend 51   Cervical - Right Rotation 75   Cervical - Left Rotation 60            Vestibular Assessment - 11/12/14 0001    Vestibular Assessment   General Observation Dix-Hallpike worsens dizziness with L cervical rotation/R lean; dizziness symptoms worse with R cervical rotation/L trunk lean    Occulomotor Exam   Saccades Comment  induced dizziness with  nystagmus    Comment double vision when pen approx 3 inches from eyes on accomodation test   mild nystagmus                        PT Education - 11/12/14 1802    Education provided Yes   Education Details plan of care moving forward, prognosis    Person(s) Educated Patient   Methods Explanation   Comprehension Verbalized understanding          PT Short Term Goals - 11/12/14 1809    PT SHORT TERM GOAL #1   Title Patient will experience no more than 5/10 dizziness at all times    Time 4   Period Weeks   Status New   PT SHORT TERM GOAL #2   Title Patient will demonstrate cervical ROM to be improved by at least 10 degrees all planes    Time 4   Period Weeks   Status New   PT SHORT TERM GOAL #3   Title Patient to demonstrate the ability to correctly and consistently perform HEP, to be updated PRN    Time 4   Period Weeks   Status New           PT Long Term Goals - 11/12/14 1810    PT LONG TERM GOAL #1   Title Patient to experience no more than 2/10 dizziness at worst    Time 8   Period Weeks   Status New   PT LONG TERM GOAL #2   Title Patient to report that he has been able to return to functional sports activities such as baseball with no increase in dizziness    Time 8   Period Weeks   Status New   PT LONG TERM GOAL #3   Title Cervical ROM to be within normal limits to reduce dizziness and reduce chances of overall recurrence of condition   Time 8   Period Weeks   Status New   PT LONG TERM GOAL #4   Title Patient to demonstrate improved balance and proprioception as evidenced by an ability to maintain SLS on foam pad with eyes closed for at least 20 seconds each leg    Time 8   Period Weeks   Status New               Plan - 11/12/14 1803    Clinical Impression Statement Patient presents with dizziness of which he states he has a severe attack about once a week and mild dizziness the rest of the days of the week; patient also  states  that he has been having neck pain that might be related to the dizziness. He reports that he believes his neck and his vertigo may be related as there are times where he will move a certain  way in bed or hold his neck a certain way in bed and will get hit by the dizziness. Patient presents with nystagmus wtih certain eye motions (See eval for details), some limiitation in cervical range of motion, and increased dizziness with Dix Hallpike maneuver especially with L trunk lean/R cervical rotation. Patient will benefit from skilled PT services in order to address his impairments and assist him in reaching an optimal level of function.    Pt will benefit from skilled therapeutic intervention in order to improve on the following deficits Decreased range of motion;Dizziness;Decreased activity tolerance;Impaired vision/preception;Postural dysfunction   Rehab Potential Good   PT Frequency 2x / week   PT Duration 8 weeks   PT Treatment/Interventions ADLs/Self Care Home Management;Therapeutic activities;Therapeutic exercise;Balance training;Neuromuscular re-education;Patient/family education;Manual techniques;Vestibular;Visual/perceptual remediation/compensation   PT Next Visit Plan please review goals and assign HEP; eye tracking exercises, balance, saccades, cervical ROM    PT Home Exercise Plan please give next session    Consulted and Agree with Plan of Care Patient         Problem List Patient Active Problem List   Diagnosis Date Noted  . Cerebral infarction, chronic   . Dizzy   . Vertigo 10/14/2014  . 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 Hypertension 10/14/2014  . CVA (cerebral infarction) 10/14/2014  . Impingement syndrome of left shoulder 08/02/2014  . 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 Abdominal pain 06/13/2014  . Rectal bleeding 03/13/2014  . Diabetes type 2, controlled 02/18/2014  . Hyperlipidemia 02/18/2014  . Diabetic peripheral neuropathy 03/28/2013  . Irreducible  incisional hernia 11/11/2010    Deniece Ree PT, DPT [facility] [address] [place], [place], [address] Phone: [phone]   Fax:  [phone]

## 2014-11-13 ENCOUNTER — Ambulatory Visit (HOSPITAL_COMMUNITY): Payer: 59

## 2014-11-13 ENCOUNTER — Telehealth (HOSPITAL_COMMUNITY): Payer: Self-pay

## 2014-11-13 DIAGNOSIS — R42 Dizziness and giddiness: Secondary | ICD-10-CM | POA: Diagnosis not present

## 2014-11-13 DIAGNOSIS — R2681 Unsteadiness on feet: Secondary | ICD-10-CM

## 2014-11-13 DIAGNOSIS — M436 Torticollis: Secondary | ICD-10-CM

## 2014-11-13 NOTE — Patient Instructions (Signed)
Eye Tracking: Horizontal / Vertical   Use small flashlight or object to direct eye movements. Be sure both eyes respond. Do not let head move. Horizontal: Slowly look to left as far as possible. Hold 5 seconds. Then look to the right. Hold 5 seconds. Vertical: Slowly look up as far as possible. Hold 5 seconds. Then look down. Hold 5 seconds. Repeat 10 times. Do 1-2 sessions per day.  Copyright  VHI. All rights reserved.   Eye Tracking: Diagonal   Use small flashlight or object to direct eye movements. Be sure both eyes respond. Do not let head move. Slowly look up and left. Hold 5 seconds. Look down and right. Hold 5 seconds. Do same up right and down left. Repeat 10 times. Do 1-2 sessions per day.  Copyright  VHI. All rights reserved.    Compensatory Strategies: Corrective Saccades   1. Holding two stationary targets placed 10 inches apart, move eyes to target, keep head still. 2. Then move head in direction of target while eyes remain on target. 3/4. Repeat in opposite direction. Perform sitting. Repeat sequence 5-10 times per session. Do 1-2 sessions per day.  Copyright  VHI. All rights reserved.

## 2014-11-13 NOTE — Therapy (Signed)
[name], [place], [address] Phone: [phone]   Fax:  [phone]  Physical Therapy Treatment  Patient Details  Name: Travis [name] MRN: [id number] Date of Birth: 01/03/1961 Referring Provider:  Kathie Dike, MD  Encounter Date: 11/13/2014      PT End of Session - 11/13/14 1646    Visit Number 2   Number of Visits 16   Date for PT Re-Evaluation 12/10/14   Authorization Type UMR   Authorization Time Period 11/12/14 to 01/13/15   PT Start Time 1623   PT Stop Time 1655   PT Time Calculation (min) 32 min      Past Medical History  Diagnosis Date  . Night sweats     every once in a while  . Hyperlipidemia   . Diabetes mellitus   . Hernia, inguinal   . Dizziness   . Chronic headaches   . Arthritis   . Neuropathy of lower extremity   . Diabetic peripheral neuropathy 03/28/2013  . Diverticulosis   . Hypertension   . Complication of anesthesia     bleed after last shoulder-had to stay overnight  . Impingement syndrome of left shoulder 08/02/2014  . 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 Multi-infarct state 10/14/2014    Past Surgical History  Procedure Laterality Date  . Inguinal hernia repair Bilateral   . Cervical disc surgery    . Wrist surgery Right     fusion  . Shoulder arthroscopy Right     1  . Knee arthroscopy Right   . Colon surgery  2003    1/3 removed for diverticulitis  . Colonoscopy    . Umbilical hernia repair      with other hernia repair with mesh  . Shoulder arthroscopy Left 08/02/2014    Procedure: LEFT SHOULDER SCOPE DEBRIDEMENT/ACROMIOPLASTY;  Surgeon: [name], MD;  Location: [place];  Service: Orthopedics;  Laterality: Left;  ANESTHESIA: GENERAL, PRE/POST OP SCALENE    There were no vitals filed for this visit.  Visit Diagnosis:  Stiffness of cervical spine  Unsteadiness      Subjective Assessment - 11/13/14 1629    Subjective  Pt stated dizziness during work making him freeze, current dizzy spell 3/10, current pain scale posterior neck and Lt UE.  Pt stated he has started to see blue spots while looking for 10 months now.   Currently in Pain? Yes   Pain Score 3    Pain Location Neck  Dizziness   Pain Orientation Left;Posterior   Pain Descriptors / Indicators Dull  Grinding                Vestibular Assessment - 11/13/14 0001    Occulomotor Exam   Head shaking Horizontal Absent   Head Shaking Vertical Absent   Saccades Poor trajectory;Slow   Comment double vision when pen approx 3 inches from eyes on accomodation test           [facility] Adult PT Treatment/Exercise - 11/13/14 0001    Exercises   Exercises Neck   Neck Exercises: Seated   Other Seated Exercise 3D cervical excursion                PT Education - 11/12/14 1802    Education provided Yes   Education Details plan of care moving forward, prognosis    Person(s) Educated Patient   Methods Explanation   Comprehension Verbalized understanding  PT Short Term Goals - 11/13/14 1831    PT SHORT TERM GOAL #1   Title Patient will experience no more than 5/10 dizziness at all times    Status On-going   PT SHORT TERM GOAL #2   Title Patient will demonstrate cervical ROM to be improved by at least 10 degrees all planes    Status On-going   PT SHORT TERM GOAL #3   Title Patient to demonstrate the ability to correctly and consistently perform HEP, to be updated PRN    Status On-going           PT Long Term Goals - 11/13/14 1831    PT LONG TERM GOAL #1   Title Patient to experience no more than 2/10 dizziness at worst    PT LONG TERM GOAL #2   Title Patient to report that he has been able to return to functional sports activities such as baseball with no increase in dizziness    PT LONG TERM GOAL #3   Title Cervical ROM to be within normal limits to reduce dizziness and reduce chances of overall recurrence of  condition   PT LONG TERM GOAL #4   Title Patient to demonstrate improved balance and proprioception as evidenced by an ability to maintain SLS on foam pad with eyes closed for at least 20 seconds each leg                Plan - 11/13/14 1720    Clinical Impression Statement Reviewed goals and copy of evaluation given today.  Session focus on education of HEP focus on improving eye tracking and saccades exercises and cervical ROM.  No noted nystagmus during eye tracking and saccades exercises, pt did comment on double vision with saccade exercise.  Pt c/o grinding all directions during cervical ROM exercises, pt encouraged to complete all direcitions within pain tolerance.     PT Next Visit Plan F/U on compliance wiht HEP and answer questions as needed, continue eye tracking and saccades exercises, cervical ROM and balance.          Problem List Patient Active Problem List   Diagnosis Date Noted  . Cerebral infarction, chronic   . Dizzy   . Vertigo 10/14/2014  . 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 Hypertension 10/14/2014  . CVA (cerebral infarction) 10/14/2014  . Impingement syndrome of left shoulder 08/02/2014  . 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 Abdominal pain 06/13/2014  . Rectal bleeding 03/13/2014  . Diabetes type 2, controlled 02/18/2014  . Hyperlipidemia 02/18/2014  . Diabetic peripheral neuropathy 03/28/2013  . Irreducible incisional hernia 11/11/2010   Ihor Austin, LPTA; [place]  Aldona Lento 11/13/2014, 6:34 PM  [facility] [address] [place], [place], [address] Phone: [phone]   Fax:  [phone]

## 2014-11-14 ENCOUNTER — Encounter (HOSPITAL_COMMUNITY): Payer: 59 | Admitting: Physical Therapy

## 2014-11-14 ENCOUNTER — Ambulatory Visit (HOSPITAL_COMMUNITY): Payer: 59

## 2014-11-14 DIAGNOSIS — R42 Dizziness and giddiness: Secondary | ICD-10-CM | POA: Diagnosis not present

## 2014-11-14 DIAGNOSIS — R29898 Other symptoms and signs involving the musculoskeletal system: Secondary | ICD-10-CM

## 2014-11-14 NOTE — Therapy (Signed)
[name], [place], [address] Phone: [phone]   Fax:  [phone]  Occupational Therapy Treatment  Patient Details  Name: Travis [name] MRN: [id number] Date of Birth: May 29, 1960 Referring Provider:  Kathie Dike, MD  Encounter Date: 11/14/2014      OT End of Session - 11/14/14 1728    Visit Number 20   Number of Visits 24   Date for OT Re-Evaluation 12/23/14   Authorization Type UMR no visit limit   OT Start Time 1645   OT Stop Time 1734   OT Time Calculation (min) 49 min   Activity Tolerance Patient tolerated treatment well   Behavior During Therapy [facility] for tasks assessed/performed      Past Medical History  Diagnosis Date  . Night sweats     every once in a while  . Hyperlipidemia   . Diabetes mellitus   . Hernia, inguinal   . Dizziness   . Chronic headaches   . Arthritis   . Neuropathy of lower extremity   . Diabetic peripheral neuropathy 03/28/2013  . Diverticulosis   . Hypertension   . Complication of anesthesia     bleed after last shoulder-had to stay overnight  . Impingement syndrome of left shoulder 08/02/2014  . 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 Multi-infarct state 10/14/2014    Past Surgical History  Procedure Laterality Date  . Inguinal hernia repair Bilateral   . Cervical disc surgery    . Wrist surgery Right     fusion  . Shoulder arthroscopy Right     1  . Knee arthroscopy Right   . Colon surgery  2003    1/3 removed for diverticulitis  . Colonoscopy    . Umbilical hernia repair      with other hernia repair with mesh  . Shoulder arthroscopy Left 08/02/2014    Procedure: LEFT SHOULDER SCOPE DEBRIDEMENT/ACROMIOPLASTY;  Surgeon: [name], MD;  Location: [facility];  Service: Orthopedics;  Laterality: Left;  ANESTHESIA: GENERAL, PRE/POST OP SCALENE    There were no vitals filed for this visit.  Visit Diagnosis:  Shoulder weakness       Subjective Assessment - 11/14/14 1653    Subjective  S: I just have this nagging feeling of numbness. Sometimes I feel like my arm isn't even mine. It makes me depressed.    Currently in Pain? No/denies   Pain Location Neck   Pain Orientation --   Pain Descriptors / Indicators Numbness;Penetrating   Pain Type --            [facility] OT Assessment - 11/14/14 1711    Assessment   Diagnosis Left shoulder arthroscopy   Precautions   Precautions Shoulder   Type of Shoulder Precautions PROM through 4/15 then progressing as tolerated                  OT Treatments/Exercises (OP) - 11/14/14 1712    Exercises   Exercises Shoulder   Shoulder Exercises: Supine   Protraction Strengthening;15 reps   Protraction Weight (lbs) 3   Horizontal ABduction Strengthening;15 reps   Horizontal ABduction Weight (lbs) 3   External Rotation Strengthening;15 reps   External Rotation Weight (lbs) 3   Internal Rotation Strengthening;15 reps   Internal Rotation Weight (lbs) 3   Flexion Strengthening;15 reps   Shoulder Flexion Weight (lbs) 3   ABduction Strengthening;15 reps   Shoulder ABduction Weight (lbs) 3   Shoulder Exercises:  Prone   Other Prone Exercises [place] exercises 3# 15X all positions   Shoulder Exercises: Standing   Internal Rotation Theraband;10 reps   Theraband Level (Shoulder Internal Rotation) Level 1 (Yellow)   Internal Rotation Limitations behind back   Shoulder Exercises: ROM/Strengthening   UBE (Upper Arm Bike) Level 2 3' forward 3' reverse                  OT Short Term Goals - 10/29/14 1701    OT SHORT TERM GOAL #1   Title Patient will be educated on a HEP.   OT SHORT TERM GOAL #2   Title Patient will decrease LUE pain to a 4/10 or less during daily tasks.    OT SHORT TERM GOAL #3   Title Patient will increase PROM to [facility] [employer] to increase ability to complete dressing tasks.   OT SHORT TERM GOAL #4   Title Patient will increase LUE strength to 3/5 to  increase ability to reach overhead.   OT SHORT TERM GOAL #5   Title Patient will decrease fascial restrctions from mod to min amount.           OT Long Term Goals - 10/29/14 1701    OT LONG TERM GOAL #1   Title Patient will return to highest level of independence with all daily, leisure, and work tasks.    Time 8   Period Weeks   Status On-going   OT LONG TERM GOAL #2   Title Patient will decrease pain to 2/10 or less during daily tasks.    Time 8   Period Weeks   OT LONG TERM GOAL #3   Title Patient will increase AROM of LUE to WNL to increase ability to complete overhead tasks at work.    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    Time 8   Period Weeks   Status On-going   OT LONG TERM GOAL #5   Title Patient will decrease fascial restrictions to trace amount.    Time 8   Period Weeks               Plan - 11/14/14 1728    Clinical Impression Statement A: Unable to add ball on the wall due to time constraint. Patient was very talkative during session which prevented [place] from completing all exercises. Pt did need to vent about work and about his progress with his LUE overall though.    Plan P: Add ball on the wall. Add overheaed lacing with weight.         Problem List Patient Active Problem List   Diagnosis Date Noted  . Cerebral infarction, chronic   . Dizzy   . Vertigo 10/14/2014  . 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 Hypertension 10/14/2014  . CVA (cerebral infarction) 10/14/2014  . Impingement syndrome of left shoulder 08/02/2014  . 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 Abdominal pain 06/13/2014  . Rectal bleeding 03/13/2014  . Diabetes type 2, controlled 02/18/2014  . Hyperlipidemia 02/18/2014  . Diabetic peripheral neuropathy 03/28/2013  . Irreducible incisional hernia 11/11/2010    Ailene Ravel, OTR/L,CBIS   [phone]  11/14/2014, 5:36 PM  [facility] [address] [place], [place], [address] Phone: [phone]   Fax:  [phone]

## 2014-11-18 ENCOUNTER — Encounter (HOSPITAL_COMMUNITY): Payer: 59 | Admitting: Physical Therapy

## 2014-11-19 ENCOUNTER — Ambulatory Visit: Payer: 59 | Admitting: Family Medicine

## 2014-11-20 ENCOUNTER — Encounter (HOSPITAL_COMMUNITY): Payer: 59 | Admitting: Physical Therapy

## 2014-11-25 ENCOUNTER — Encounter (HOSPITAL_COMMUNITY): Payer: 59 | Admitting: Physical Therapy

## 2014-11-27 ENCOUNTER — Encounter (HOSPITAL_COMMUNITY): Payer: 59 | Admitting: Physical Therapy

## 2014-11-27 ENCOUNTER — Ambulatory Visit (HOSPITAL_COMMUNITY): Payer: 59 | Admitting: Physical Therapy

## 2014-11-27 DIAGNOSIS — R42 Dizziness and giddiness: Secondary | ICD-10-CM | POA: Diagnosis not present

## 2014-11-27 DIAGNOSIS — M436 Torticollis: Secondary | ICD-10-CM

## 2014-11-27 DIAGNOSIS — R2681 Unsteadiness on feet: Secondary | ICD-10-CM

## 2014-11-27 NOTE — Therapy (Signed)
[place], [place], [address] Phone: [phone]   Fax:  [phone]  Physical Therapy Treatment  Patient Details  Name: Travis [name] MRN: [id number] Date of Birth: June 10, 1960 Referring Provider:  Kathie Dike, MD  Encounter Date: 11/27/2014      PT End of Session - 11/27/14 1750    Visit Number 3   Number of Visits 16   Date for PT Re-Evaluation 12/10/14   Authorization Type UMR   Authorization Time Period 11/12/14 to 01/13/15   PT Start Time 1646   PT Stop Time 1740   PT Time Calculation (min) 54 min   Activity Tolerance Patient tolerated treatment well   Behavior During Therapy [facility] for tasks assessed/performed      Past Medical History  Diagnosis Date  . Night sweats     every once in a while  . Hyperlipidemia   . Diabetes mellitus   . Hernia, inguinal   . Dizziness   . Chronic headaches   . Arthritis   . Neuropathy of lower extremity   . Diabetic peripheral neuropathy 03/28/2013  . Diverticulosis   . Hypertension   . Complication of anesthesia     bleed after last shoulder-had to stay overnight  . Impingement syndrome of left shoulder 08/02/2014  . 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 Multi-infarct state 10/14/2014    Past Surgical History  Procedure Laterality Date  . Inguinal hernia repair Bilateral   . Cervical disc surgery    . Wrist surgery Right     fusion  . Shoulder arthroscopy Right     1  . Knee arthroscopy Right   . Colon surgery  2003    1/3 removed for diverticulitis  . Colonoscopy    . Umbilical hernia repair      with other hernia repair with mesh  . Shoulder arthroscopy Left 08/02/2014    Procedure: LEFT SHOULDER SCOPE DEBRIDEMENT/ACROMIOPLASTY;  Surgeon: [name], MD;  Location: [place];  Service: Orthopedics;  Laterality: Left;  ANESTHESIA: GENERAL, PRE/POST OP SCALENE    There were no vitals filed for this visit.  Visit  Diagnosis:  Stiffness of cervical spine  Unsteadiness  Vertigo      Subjective Assessment - 11/27/14 1724    Subjective Patient reports that he is feeling just worn out today, hips, back, feet, is very bad today, just wearing him out. Having a little soreness in L upper trap; went to see Dr. Karin Lieu last week for X-ray of neck and MD told him there are bone spurs in cervical  spine and to take it easy with any exercises that irritate neck. MD prescribed  some medication for 6 days, which the patient is going to start on Friday. Patient reports he has been stressed at work and has been butting heads with his boss recently.  Going back to see Dr. Karin Lieu in about 6 weeks. Dizziness had been floating around today, patient has had to hold onto things intermittently through the day because of his balance; feels light headed and dizzy a lot    Currently in Pain? Yes   Pain Score 2    Pain Location Shoulder   Pain Orientation Left                         [facility] Adult PT Treatment/Exercise - 11/27/14 0001    Neck Exercises: Seated   Other Seated Exercise 3D  cervical and thoracic excursions 1x10   Manual Therapy   Manual Therapy Joint mobilization   Joint Mobilization First rib mobilizations x2 bilaterally grade 3                 PT Education - 11/27/14 1749    Education provided Yes   Education Details education on visual tracks and possible effects of recent stroke on visual perception; education on first rib and related mobilizations; education on first rib tighteness and possible thoracic outlet syndrome in relation to dizziness ;  self first rib mobilziation HEP    Person(s) Educated Patient   Methods Explanation   Comprehension Verbalized understanding          PT Short Term Goals - 11/13/14 1831    PT SHORT TERM GOAL #1   Title Patient will experience no more than 5/10 dizziness at all times    Status On-going   PT SHORT TERM GOAL #2   Title Patient will  demonstrate cervical ROM to be improved by at least 10 degrees all planes    Status On-going   PT SHORT TERM GOAL #3   Title Patient to demonstrate the ability to correctly and consistently perform HEP, to be updated PRN    Status On-going           PT Long Term Goals - 11/13/14 1831    PT LONG TERM GOAL #1   Title Patient to experience no more than 2/10 dizziness at worst    PT LONG TERM GOAL #2   Title Patient to report that he has been able to return to functional sports activities such as baseball with no increase in dizziness    PT LONG TERM GOAL #3   Title Cervical ROM to be within normal limits to reduce dizziness and reduce chances of overall recurrence of condition   PT LONG TERM GOAL #4   Title Patient to demonstrate improved balance and proprioception as evidenced by an ability to maintain SLS on foam pad with eyes closed for at least 20 seconds each leg                Plan - 11/27/14 1750    Clinical Impression Statement Patient arrived feeling very under the weather today; just very worn out from work and having fluctuating levels of dizziness that have been limiting him through the day. Patient reports concern about bone spurs that his MD reported in his cervical spine and also about blue dots he has been seeing in his vision for some time now; reports that MD is ok with PT continuing to work on neck within pain limitations. Performed general mobility for cervical and thoracic spines today, also introduced first rib mobilizations and identified significantly positive ROOS test on L side, indicating possible thoracic outlet syndrome that could possibly be contributing to symptoms. Patient reported feeling signfiicantly better after first rib mobilizations with apparent instantly improved cervical ROM and quality of motion, reduced discomfort, and reduced dizziness even in dynamic gait sittuations with rotation and level changes. Educated patient on self first-rib  mobilizations using sheet. PT to follow up with patient next session to assess efficiency of addition to HEP and further assess patient response to this course of treatment.    Pt will benefit from skilled therapeutic intervention in order to improve on the following deficits Decreased range of motion;Dizziness;Decreased activity tolerance;Impaired vision/preception;Postural dysfunction   Rehab Potential Good   PT Frequency 2x / week   PT Duration 8 weeks  PT Treatment/Interventions ADLs/Self Care Home Management;Therapeutic activities;Therapeutic exercise;Balance training;Neuromuscular re-education;Patient/family education;Manual techniques;Vestibular;Visual/perceptual remediation/compensation   PT Next Visit Plan assess patient tolerance of and response to HEP/first rib mobs; continue cervical ROM and visual tracking exercise; balance exercise; postural stretching and strengthening    PT Home Exercise Plan updated    Consulted and Agree with Plan of Care Patient        Problem List Patient Active Problem List   Diagnosis Date Noted  . Cerebral infarction, chronic   . Dizzy   . Vertigo 10/14/2014  . 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 Hypertension 10/14/2014  . CVA (cerebral infarction) 10/14/2014  . Impingement syndrome of left shoulder 08/02/2014  . 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 Abdominal pain 06/13/2014  . Rectal bleeding 03/13/2014  . Diabetes type 2, controlled 02/18/2014  . Hyperlipidemia 02/18/2014  . Diabetic peripheral neuropathy 03/28/2013  . Irreducible incisional hernia 11/11/2010    Deniece Ree PT, DPT [place] [address] [place], [place], [address] Phone: [phone]   Fax:  [phone]

## 2014-11-27 NOTE — Patient Instructions (Signed)
   First Rib Self Mobilization  Place a bedsheet or strap over the curve of the neck that is painful. Sit on the long end of the bedsheet with your opposite hip so that the sheet crosses over your back. Make sure your shoulders are relaxed. Pull down on the front of the sheet until a good tension is felt on rib (see picture). Exhale and hold that tension with your hands. Take a deep breath in without allowing the sheet to rise with the breath and exhale. Repeat at least 10-15 times per shoulder or as needed through the day. If you ever have pain or increased dizziness/visual changes, stop and call the clinic.

## 2014-11-28 ENCOUNTER — Ambulatory Visit (HOSPITAL_COMMUNITY): Payer: 59

## 2014-11-29 ENCOUNTER — Other Ambulatory Visit: Payer: Self-pay | Admitting: *Deleted

## 2014-12-01 NOTE — Progress Notes (Signed)
May approve refills 4

## 2014-12-02 ENCOUNTER — Other Ambulatory Visit: Payer: Self-pay | Admitting: *Deleted

## 2014-12-02 MED ORDER — MECLIZINE HCL 25 MG PO TABS: 25.0000 mg | ORAL_TABLET | Freq: Three times a day (TID) | ORAL | Status: DC | PRN

## 2014-12-03 ENCOUNTER — Ambulatory Visit (HOSPITAL_COMMUNITY): Payer: 59

## 2014-12-03 ENCOUNTER — Encounter (HOSPITAL_COMMUNITY): Payer: Self-pay

## 2014-12-03 ENCOUNTER — Ambulatory Visit (HOSPITAL_COMMUNITY): Payer: 59 | Attending: Internal Medicine | Admitting: Physical Therapy

## 2014-12-03 DIAGNOSIS — M25612 Stiffness of left shoulder, not elsewhere classified: Secondary | ICD-10-CM | POA: Insufficient documentation

## 2014-12-03 DIAGNOSIS — M6289 Other specified disorders of muscle: Secondary | ICD-10-CM

## 2014-12-03 DIAGNOSIS — R42 Dizziness and giddiness: Secondary | ICD-10-CM | POA: Insufficient documentation

## 2014-12-03 DIAGNOSIS — R2681 Unsteadiness on feet: Secondary | ICD-10-CM | POA: Insufficient documentation

## 2014-12-03 DIAGNOSIS — M629 Disorder of muscle, unspecified: Secondary | ICD-10-CM | POA: Diagnosis present

## 2014-12-03 DIAGNOSIS — R29898 Other symptoms and signs involving the musculoskeletal system: Secondary | ICD-10-CM

## 2014-12-03 DIAGNOSIS — M436 Torticollis: Secondary | ICD-10-CM | POA: Insufficient documentation

## 2014-12-03 NOTE — Therapy (Signed)
[place], [place], [address] Phone: [phone]   Fax:  [phone]  Physical Therapy Treatment  Patient Details  Name: Travis [name] MRN: [id number] Date of Birth: Apr 23, 1961 Referring Provider:  Kathyrn Drown, MD  Encounter Date: 12/03/2014      PT End of Session - 12/03/14 1737    Visit Number 4   Number of Visits 16   Date for PT Re-Evaluation 12/10/14   Authorization Type UMR   Authorization Time Period 11/12/14 to 01/13/15   PT Start Time 1648   PT Stop Time 1729   PT Time Calculation (min) 41 min   Activity Tolerance Patient tolerated treatment well   Behavior During Therapy [facility] for tasks assessed/performed      Past Medical History  Diagnosis Date  . Night sweats     every once in a while  . Hyperlipidemia   . Diabetes mellitus   . Hernia, inguinal   . Dizziness   . Chronic headaches   . Arthritis   . Neuropathy of lower extremity   . Diabetic peripheral neuropathy 03/28/2013  . Diverticulosis   . Hypertension   . Complication of anesthesia     bleed after last shoulder-had to stay overnight  . Impingement syndrome of left shoulder 08/02/2014  . 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 Multi-infarct state 10/14/2014    Past Surgical History  Procedure Laterality Date  . Inguinal hernia repair Bilateral   . Cervical disc surgery    . Wrist surgery Right     fusion  . Shoulder arthroscopy Right     1  . Knee arthroscopy Right   . Colon surgery  2003    1/3 removed for diverticulitis  . Colonoscopy    . Umbilical hernia repair      with other hernia repair with mesh  . Shoulder arthroscopy Left 08/02/2014    Procedure: LEFT SHOULDER SCOPE DEBRIDEMENT/ACROMIOPLASTY;  Surgeon: [name], MD;  Location: [place];  Service: Orthopedics;  Laterality: Left;  ANESTHESIA: GENERAL, PRE/POST OP SCALENE    There were no vitals filed for this visit.  Visit  Diagnosis:  Stiffness of cervical spine  Unsteadiness  Vertigo      Subjective Assessment - 12/03/14 1650    Subjective Patient reports he is feeling a little better today, felt a litlte rough this morning but is better now. Has been on new medicine and it seems to be helping back pain, has not noticed a difference in energy. Still having blue dots in vision and visiion is a little bit blurry. Going to see another neurologist next week, on the 11th.     Pertinent History Vertigo started about 2 years ago; just came on out of nowhere. Right now dizziness is 10/10. Seems like every time it sets off, something in neck seems to pop. Addressed shoulder first, now coming for vertigo. Had neck surgery about 5-6 years ago .   Currently in Pain? Yes   Pain Score 3    Pain Location Neck                         [facility] Adult PT Treatment/Exercise - 12/03/14 1656    Neck Exercises: Seated   Neck Retraction 15 reps   Neck Retraction Limitations cues for form    Other Seated Exercise 3D cervical and thoracic excursions 1x10   Other Seated Exercise Posterior shoulder rolls  1x20   Manual Therapy   Manual Therapy Joint mobilization   Joint Mobilization First rib mobilizations x3 bilaterally grade 3          Vestibular Treatment/Exercise - 12/03/14 0001    Eye/Head Exercise Vertical   Comment visual tracking with head still on horizontal, vertical, diagonal planes; accomodation exercise with bilateral eyes and unilateral eyes- dizziness started with pen 6" from nose with R eye and 8" from nose with L eye. Saccades on horizontal and vertical planes.                PT Education - 12/03/14 1735    Education provided Yes   Education Details patient educated to tell MD regarding symptoms of head fullness, dizziness, and intermittent hearing changes next MD appointment   Person(s) Educated Patient   Methods Explanation   Comprehension Verbalized understanding          PT  Short Term Goals - 11/13/14 1831    PT SHORT TERM GOAL #1   Title Patient will experience no more than 5/10 dizziness at all times    Status On-going   PT SHORT TERM GOAL #2   Title Patient will demonstrate cervical ROM to be improved by at least 10 degrees all planes    Status On-going   PT SHORT TERM GOAL #3   Title Patient to demonstrate the ability to correctly and consistently perform HEP, to be updated PRN    Status On-going           PT Long Term Goals - 11/13/14 1831    PT LONG TERM GOAL #1   Title Patient to experience no more than 2/10 dizziness at worst    PT LONG TERM GOAL #2   Title Patient to report that he has been able to return to functional sports activities such as baseball with no increase in dizziness    PT LONG TERM GOAL #3   Title Cervical ROM to be within normal limits to reduce dizziness and reduce chances of overall recurrence of condition   PT LONG TERM GOAL #4   Title Patient to demonstrate improved balance and proprioception as evidenced by an ability to maintain SLS on foam pad with eyes closed for at least 20 seconds each leg                Plan - 12/03/14 1739    Clinical Impression Statement Continued with mobility and introduced postural training; also continued first rib mobilizations. Continued visual stabilization exercises today with increased nystagmus and dizziness symptoms with accomodation exercise. also performed accomodation exercise with U eyes; nystagmus with pen 6 inches from nose on R, nystagmus with pen 8 inches from nose on L. Also had dizziness with saccades up and to the right today. Encouraged patient to mention concurrent symptoms of head fullness, dizziness, and intermittent hearing changes to MD at next appointment.  Patient did experience eye fatigue during visual tracking exercises.    Pt will benefit from skilled therapeutic intervention in order to improve on the following deficits Decreased range of  motion;Dizziness;Decreased activity tolerance;Impaired vision/preception;Postural dysfunction   Rehab Potential Good   PT Frequency 2x / week   PT Duration 8 weeks   PT Treatment/Interventions ADLs/Self Care Home Management;Therapeutic activities;Therapeutic exercise;Balance training;Neuromuscular re-education;Patient/family education;Manual techniques;Vestibular;Visual/perceptual remediation/compensation   PT Next Visit Plan Continue mobility exercises and first rib mobs; visual tracking exercises; balace exercises   PT Home Exercise Plan updated    Consulted and Agree with Plan of  Care Patient        Problem List Patient Active Problem List   Diagnosis Date Noted  . Cerebral infarction, chronic   . Dizzy   . Vertigo 10/14/2014  . 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 Hypertension 10/14/2014  . CVA (cerebral infarction) 10/14/2014  . Impingement syndrome of left shoulder 08/02/2014  . 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 Abdominal pain 06/13/2014  . Rectal bleeding 03/13/2014  . Diabetes type 2, controlled 02/18/2014  . Hyperlipidemia 02/18/2014  . Diabetic peripheral neuropathy 03/28/2013  . Irreducible incisional hernia 11/11/2010    Deniece Ree PT, DPT [place] [address] [place], [place], [address] Phone: [phone]   Fax:  [phone]

## 2014-12-03 NOTE — Therapy (Signed)
[name], [place], [address] Phone: [phone]   Fax:  [phone]  Occupational Therapy Treatment  Patient Details  Name: Travis [name] MRN: [id number] Date of Birth: May 14, 1960 Referring Provider:  Kathyrn Drown, MD  Encounter Date: 12/03/2014      OT End of Session - 12/03/14 1640    Visit Number 21   Number of Visits 24   Date for OT Re-Evaluation 12/23/14   Authorization Type UMR no visit limit   OT Start Time 1605   OT Stop Time 1645   OT Time Calculation (min) 40 min   Activity Tolerance Patient tolerated treatment well   Behavior During Therapy [facility] for tasks assessed/performed      Past Medical History  Diagnosis Date  . Night sweats     every once in a while  . Hyperlipidemia   . Diabetes mellitus   . Hernia, inguinal   . Dizziness   . Chronic headaches   . Arthritis   . Neuropathy of lower extremity   . Diabetic peripheral neuropathy 03/28/2013  . Diverticulosis   . Hypertension   . Complication of anesthesia     bleed after last shoulder-had to stay overnight  . Impingement syndrome of left shoulder 08/02/2014  . 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 Multi-infarct state 10/14/2014    Past Surgical History  Procedure Laterality Date  . Inguinal hernia repair Bilateral   . Cervical disc surgery    . Wrist surgery Right     fusion  . Shoulder arthroscopy Right     1  . Knee arthroscopy Right   . Colon surgery  2003    1/3 removed for diverticulitis  . Colonoscopy    . Umbilical hernia repair      with other hernia repair with mesh  . Shoulder arthroscopy Left 08/02/2014    Procedure: LEFT SHOULDER SCOPE DEBRIDEMENT/ACROMIOPLASTY;  Surgeon: [name], MD;  Location: [place];  Service: Orthopedics;  Laterality: Left;  ANESTHESIA: GENERAL, PRE/POST OP SCALENE    There were no vitals filed for this visit.  Visit Diagnosis:  Shoulder  weakness  Shoulder stiffness, left  Tight fascia                    OT Treatments/Exercises (OP) - 12/03/14 1619    Exercises   Exercises Shoulder   Shoulder Exercises: Supine   Protraction Strengthening;15 reps   Protraction Weight (lbs) 3   Horizontal ABduction Strengthening;15 reps   Horizontal ABduction Weight (lbs) 3   External Rotation Strengthening;15 reps   External Rotation Weight (lbs) 3   Internal Rotation Strengthening;15 reps   Internal Rotation Weight (lbs) 3   Flexion Strengthening;15 reps   Shoulder Flexion Weight (lbs) 3   ABduction Strengthening;15 reps   Shoulder ABduction Weight (lbs) 3   Shoulder Exercises: Prone   Other Prone Exercises [place] exercises 3# 15X all positions   Other Prone Exercises Face Down Field goal; no weight; 15X   Shoulder Exercises: ROM/Strengthening   UBE (Upper Arm Bike) Level 2 3' forward 3' reverse   Over Head Lace 2' 3# wrist weight   Ball on Wall 1' flexion 1' abduction green ball                  OT Short Term Goals - 10/29/14 1701    OT SHORT TERM GOAL #1   Title Patient will be educated on a HEP.  OT SHORT TERM GOAL #2   Title Patient will decrease LUE pain to a 4/10 or less during daily tasks.    OT SHORT TERM GOAL #3   Title Patient will increase PROM to [facility] to increase ability to complete dressing tasks.   OT SHORT TERM GOAL #4   Title Patient will increase LUE strength to 3/5 to increase ability to reach overhead.   OT SHORT TERM GOAL #5   Title Patient will decrease fascial restrctions from mod to min amount.           OT Long Term Goals - 10/29/14 1701    OT LONG TERM GOAL #1   Title Patient will return to highest level of independence with all daily, leisure, and work tasks.    Time 8   Period Weeks   Status On-going   OT LONG TERM GOAL #2   Title Patient will decrease pain to 2/10 or less during daily tasks.    Time 8   Period Weeks   OT LONG TERM GOAL #3   Title  Patient will increase AROM of LUE to WNL to increase ability to complete overhead tasks at work.    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    Time 8   Period Weeks   Status On-going   OT LONG TERM GOAL #5   Title Patient will decrease fascial restrictions to trace amount.    Time 8   Period Weeks               Plan - 12/03/14 1641    Clinical Impression Statement A: Continued with ball on the wall and added 3# wrist weight to overhead lacing. Patient tolerated well.    Plan P: Add extended arm curl with red theraband        Problem List Patient Active Problem List   Diagnosis Date Noted  . Cerebral infarction, chronic   . Dizzy   . Vertigo 10/14/2014  . 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 Hypertension 10/14/2014  . CVA (cerebral infarction) 10/14/2014  . Impingement syndrome of left shoulder 08/02/2014  . 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 Abdominal pain 06/13/2014  . Rectal bleeding 03/13/2014  . Diabetes type 2, controlled 02/18/2014  . Hyperlipidemia 02/18/2014  . Diabetic peripheral neuropathy 03/28/2013  . Irreducible incisional hernia 11/11/2010    Travis [name], OTR/L,CBIS  [phone]  12/03/2014, 4:48 PM  [facility] [address] [place], [place], [address] Phone: [phone]   Fax:  [phone]

## 2014-12-05 ENCOUNTER — Encounter (HOSPITAL_COMMUNITY): Payer: Self-pay

## 2014-12-05 ENCOUNTER — Ambulatory Visit (HOSPITAL_COMMUNITY): Payer: 59

## 2014-12-05 DIAGNOSIS — M436 Torticollis: Secondary | ICD-10-CM | POA: Diagnosis not present

## 2014-12-05 DIAGNOSIS — R29898 Other symptoms and signs involving the musculoskeletal system: Secondary | ICD-10-CM

## 2014-12-05 NOTE — Therapy (Signed)
[place], [place], [address] Phone: [phone]   Fax:  [phone]  Occupational Therapy Treatment  Patient Details  Name: Travis [name] MRN: [id number] Date of Birth: 06/14/1960 Referring Provider:  Kathyrn Drown, MD  Encounter Date: 12/05/2014      OT End of Session - 12/05/14 1649    Visit Number 22   Number of Visits 24   Date for OT Re-Evaluation 12/23/14   Authorization Type UMR no visit limit   OT Start Time 1611   OT Stop Time 1650   OT Time Calculation (min) 39 min   Activity Tolerance Patient tolerated treatment well   Behavior During Therapy [facility] for tasks assessed/performed      Past Medical History  Diagnosis Date  . Night sweats     every once in a while  . Hyperlipidemia   . Diabetes mellitus   . Hernia, inguinal   . Dizziness   . Chronic headaches   . Arthritis   . Neuropathy of lower extremity   . Diabetic peripheral neuropathy 03/28/2013  . Diverticulosis   . Hypertension   . Complication of anesthesia     bleed after last shoulder-had to stay overnight  . Impingement syndrome of left shoulder 08/02/2014  . 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 Multi-infarct state 10/14/2014    Past Surgical History  Procedure Laterality Date  . Inguinal hernia repair Bilateral   . Cervical disc surgery    . Wrist surgery Right     fusion  . Shoulder arthroscopy Right     1  . Knee arthroscopy Right   . Colon surgery  2003    1/3 removed for diverticulitis  . Colonoscopy    . Umbilical hernia repair      with other hernia repair with mesh  . Shoulder arthroscopy Left 08/02/2014    Procedure: LEFT SHOULDER SCOPE DEBRIDEMENT/ACROMIOPLASTY;  Surgeon: [name], MD;  Location: [place];  Service: Orthopedics;  Laterality: Left;  ANESTHESIA: GENERAL, PRE/POST OP SCALENE    There were no vitals filed for this visit.  Visit Diagnosis:  Shoulder weakness       Subjective Assessment - 12/05/14 1618    Subjective  S: My arm is feeling good.    Currently in Pain? Yes   Pain Score 2    Pain Location Shoulder   Pain Orientation Left   Pain Descriptors / Indicators Sore   Pain Type Acute pain            [facility] OT Assessment - 12/05/14 1619    Assessment   Diagnosis Left shoulder arthroscopy   Precautions   Precautions Shoulder   Type of Shoulder Precautions PROM through 4/15 then progressing as tolerated                  OT Treatments/Exercises (OP) - 12/05/14 1619    Exercises   Exercises Shoulder   Shoulder Exercises: Prone   Other Prone Exercises [place] exercises 3# 15X all positions   Other Prone Exercises Face Down Field goal; 3#; 15X   Shoulder Exercises: ROM/Strengthening   UBE (Upper Arm Bike) AirDyne Bike; 3' forward 3' reverse   Cybex Press 3 plate;15 reps   Cybex Row 3 plate;15 reps   Other ROM/Strengthening Exercises Scapular raises; prone; 15X; 3#                  OT Short Term Goals - 10/29/14  [place] #1   Title Patient will be educated on a HEP.   OT SHORT TERM GOAL #2   Title Patient will decrease LUE pain to a 4/10 or less during daily tasks.    OT SHORT TERM GOAL #3   Title Patient will increase PROM to [facility] to increase ability to complete dressing tasks.   OT SHORT TERM GOAL #4   Title Patient will increase LUE strength to 3/5 to increase ability to reach overhead.   OT SHORT TERM GOAL #5   Title Patient will decrease fascial restrctions from mod to min amount.           OT Long Term Goals - 10/29/14 1701    OT LONG TERM GOAL #1   Title Patient will return to highest level of independence with all daily, leisure, and work tasks.    Time 8   Period Weeks   Status On-going   OT LONG TERM GOAL #2   Title Patient will decrease pain to 2/10 or less during daily tasks.    Time 8   Period Weeks   OT LONG TERM GOAL #3   Title Patient will increase AROM of LUE  to WNL to increase ability to complete overhead tasks at work.    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    Time 8   Period Weeks   Status On-going   OT LONG TERM GOAL #5   Title Patient will decrease fascial restrictions to trace amount.    Time 8   Period Weeks               Plan - 12/05/14 1655    Clinical Impression Statement A: Did not add extended arm curl due to time constaint. Added AirDyne bike. Patient tolerated well. Pt was able to do prone field goal with 3# weight this session.    Plan P: Add extended arm curl with red theraband.        Problem List Patient Active Problem List   Diagnosis Date Noted  . Cerebral infarction, chronic   . Dizzy   . Vertigo 10/14/2014  . 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 Hypertension 10/14/2014  . CVA (cerebral infarction) 10/14/2014  . Impingement syndrome of left shoulder 08/02/2014  . 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 Abdominal pain 06/13/2014  . Rectal bleeding 03/13/2014  . Diabetes type 2, controlled 02/18/2014  . Hyperlipidemia 02/18/2014  . Diabetic peripheral neuropathy 03/28/2013  . Irreducible incisional hernia 11/11/2010    Ailene Ravel, OTR/L,CBIS  [phone]  12/05/2014, 4:57 PM  [facility] [address] [place], [place], [address] Phone: [phone]   Fax:  [phone]

## 2014-12-06 ENCOUNTER — Ambulatory Visit (HOSPITAL_COMMUNITY): Payer: 59

## 2014-12-06 DIAGNOSIS — R42 Dizziness and giddiness: Secondary | ICD-10-CM

## 2014-12-06 DIAGNOSIS — R2681 Unsteadiness on feet: Secondary | ICD-10-CM

## 2014-12-06 DIAGNOSIS — M436 Torticollis: Secondary | ICD-10-CM | POA: Diagnosis not present

## 2014-12-06 NOTE — Therapy (Signed)
[place], [place], [address] Phone: [phone]   Fax:  [phone]  Physical Therapy Treatment  Patient Details  Name: Travis [name] MRN: [id number] Date of Birth: [birth year] Referring Provider:  Kathie Dike, MD  Encounter Date: 12/06/2014      PT End of Session - 12/06/14 1714    Visit Number 5   Number of Visits 16   Date for PT Re-Evaluation 12/10/14   Authorization Type UMR   Authorization Time Period 11/12/14 to 01/13/15   PT Start Time 1658   PT Stop Time 1737   PT Time Calculation (min) 39 min   Activity Tolerance Patient tolerated treatment well   Behavior During Therapy [facility] for tasks assessed/performed      Past Medical History  Diagnosis Date  . Night sweats     every once in a while  . Hyperlipidemia   . Diabetes mellitus   . Hernia, inguinal   . Dizziness   . Chronic headaches   . Arthritis   . Neuropathy of lower extremity   . Diabetic peripheral neuropathy 03/28/2013  . Diverticulosis   . Hypertension   . Complication of anesthesia     bleed after last shoulder-had to stay overnight  . Impingement syndrome of left shoulder 08/02/2014  . 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 Multi-infarct state 10/14/2014    Past Surgical History  Procedure Laterality Date  . Inguinal hernia repair Bilateral   . Cervical disc surgery    . Wrist surgery Right     fusion  . Shoulder arthroscopy Right     1  . Knee arthroscopy Right   . Colon surgery  2003    1/3 removed for diverticulitis  . Colonoscopy    . Umbilical hernia repair      with other hernia repair with mesh  . Shoulder arthroscopy Left 08/02/2014    Procedure: LEFT SHOULDER SCOPE DEBRIDEMENT/ACROMIOPLASTY;  Surgeon: [name], MD;  Location: [place];  Service: Orthopedics;  Laterality: Left;  ANESTHESIA: GENERAL, PRE/POST OP SCALENE    There were no vitals filed for this visit.  Visit  Diagnosis:  Stiffness of cervical spine  Unsteadiness  Vertigo      Subjective Assessment - 12/06/14 1658    Subjective Pt stated he had one vertigo episode for 10 minutes earlier today, pt stated he felt numb all over body today.  Continues to have neck pain with radicular symptoms down Lt arm to   Reports no dizziness with eyes shut, continues to have double vision with items closer then further away.  Apt scheduled with neurologist on August 11st.   Currently in Pain? Yes   Pain Score 2    Pain Location Neck   Pain Orientation Left   Pain Descriptors / Indicators Tingling;Numbness                         [facility] Adult PT Treatment/Exercise - 12/06/14 0001    Neck Exercises: Seated   Neck Retraction 15 reps   Neck Retraction Limitations cues for form    Other Seated Exercise 3D cervical and thoracic excursions 1x10   Other Seated Exercise Posterior shoulder rolls 1x20   Neck Exercises: Stretches   Upper Trapezius Stretch 2 reps;30 seconds   Other Neck Stretches Unilateral pec strech at wall 2x 30"         Vestibular Treatment/Exercise - 12/06/14 0001  Eye/Head Exercise Vertical   Comment visual tracking with head still on horizontal, vertical, diagonal planes; accomodation exercise with bilateral eyes and unilateral eyes- dizziness started with pen 6" from nose with R eye and 8" from nose with L eye. Saccades on horizontal and vertical planes.              PT Short Term Goals - 12/06/14 1714    PT SHORT TERM GOAL #1   Title Patient will experience no more than 5/10 dizziness at all times    Status On-going   PT SHORT TERM GOAL #2   Title Patient will demonstrate cervical ROM to be improved by at least 10 degrees all planes    Status On-going   PT SHORT TERM GOAL #3   Title Patient to demonstrate the ability to correctly and consistently perform HEP, to be updated PRN    Status On-going           PT Long Term Goals - 12/06/14 1723    PT LONG  TERM GOAL #1   Title Patient to experience no more than 2/10 dizziness at worst    PT LONG TERM GOAL #2   Title Patient to report that he has been able to return to functional sports activities such as baseball with no increase in dizziness    PT LONG TERM GOAL #3   Title Cervical ROM to be within normal limits to reduce dizziness and reduce chances of overall recurrence of condition   PT LONG TERM GOAL #4   Title Patient to demonstrate improved balance and proprioception as evidenced by an ability to maintain SLS on foam pad with eyes closed for at least 20 seconds each leg                Plan - 12/06/14 1826    Clinical Impression Statement Session focus on improving visual gaze stabilization exercises, cervical mobility and improving postural strengthening.  No noted nystagmus during gaze exercises, pt does report double vision and increased dizziness with saccades up to the right and left, normal vision lower.  Pt does demonstrate increased eye fatigue during visual tracking exercises.  Pt c/o increased pain and tingling down Lt arm with cervical extension.  Following tactile cueing for postural training noted tight Lt anterior scalenes, BIl upper traps and Lt pecs.  Added stretches for Bil UT and unilateral pec stretches with reports of pain relief and decreased tingling down Lt arm.  Pt reports vast improvements with pain following Bil first rib mobs.     PT Next Visit Plan Continue mobility exercises and first rib mobs; visual tracking exercises; balance exercises        Problem List Patient Active Problem List   Diagnosis Date Noted  . Cerebral infarction, chronic   . Dizzy   . Vertigo 10/14/2014  . 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 Hypertension 10/14/2014  . CVA (cerebral infarction) 10/14/2014  . Impingement syndrome of left shoulder 08/02/2014  . 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 Abdominal pain 06/13/2014  . Rectal bleeding 03/13/2014  . Diabetes  type 2, controlled 02/18/2014  . Hyperlipidemia 02/18/2014  . Diabetic peripheral neuropathy 03/28/2013  . Irreducible incisional hernia 11/11/2010   Ihor Austin, LPTA; [place]  Aldona Lento 12/06/2014, 6:34 PM  [facility] [address] [place], [place], [address] Phone: [phone]   Fax:  [phone]

## 2014-12-10 ENCOUNTER — Encounter (HOSPITAL_COMMUNITY): Payer: 59

## 2014-12-10 ENCOUNTER — Ambulatory Visit (HOSPITAL_COMMUNITY): Payer: 59 | Admitting: Physical Therapy

## 2014-12-10 DIAGNOSIS — R2681 Unsteadiness on feet: Secondary | ICD-10-CM

## 2014-12-10 DIAGNOSIS — M436 Torticollis: Secondary | ICD-10-CM | POA: Diagnosis not present

## 2014-12-10 DIAGNOSIS — R42 Dizziness and giddiness: Secondary | ICD-10-CM

## 2014-12-10 NOTE — Therapy (Signed)
[place], [place], [address] Phone: [phone]   Fax:  [phone]  Physical Therapy Treatment  Patient Details  Name: Travis [name] MRN: [id number] Date of Birth: [birth year] Referring Provider:  Kathyrn Drown, MD  Encounter Date: 12/10/2014      PT End of Session - 12/10/14 1706    Visit Number 6   Number of Visits 16   Date for PT Re-Evaluation 12/10/14   Authorization Type UMR   Authorization Time Period 11/12/14 to 01/13/15   PT Start Time 1607   PT Stop Time 1700   PT Time Calculation (min) 53 min   Activity Tolerance Patient tolerated treatment well   Behavior During Therapy [facility] for tasks assessed/performed      Past Medical History  Diagnosis Date  . Night sweats     every once in a while  . Hyperlipidemia   . Diabetes mellitus   . Hernia, inguinal   . Dizziness   . Chronic headaches   . Arthritis   . Neuropathy of lower extremity   . Diabetic peripheral neuropathy 03/28/2013  . Diverticulosis   . Hypertension   . Complication of anesthesia     bleed after last shoulder-had to stay overnight  . Impingement syndrome of left shoulder 08/02/2014  . 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 Multi-infarct state 10/14/2014    Past Surgical History  Procedure Laterality Date  . Inguinal hernia repair Bilateral   . Cervical disc surgery    . Wrist surgery Right     fusion  . Shoulder arthroscopy Right     1  . Knee arthroscopy Right   . Colon surgery  2003    1/3 removed for diverticulitis  . Colonoscopy    . Umbilical hernia repair      with other hernia repair with mesh  . Shoulder arthroscopy Left 08/02/2014    Procedure: LEFT SHOULDER SCOPE DEBRIDEMENT/ACROMIOPLASTY;  Surgeon: [name], MD;  Location: [place];  Service: Orthopedics;  Laterality: Left;  ANESTHESIA: GENERAL, PRE/POST OP SCALENE    There were no vitals filed for this visit.  Visit  Diagnosis:  Stiffness of cervical spine  Unsteadiness  Vertigo      Subjective Assessment - 12/10/14 1609    Subjective Patient reports he is having around 2/10 dizziness today but his main concern today have been his eye problems- still seeing blue dots just like he has had a flash right in his eyes, reports that it is maybe around the size of half of his pinky thumbnail. Medications are the same. Reports he is trying not to get in any weird positions. Reports that he has been having trouble maintaining grip on items recently, has to pick things up, drops them, picks them up again.     Pertinent History Vertigo started about 2 years ago; just came on out of nowhere. Right now dizziness is 10/10. Seems like every time it sets off, something in neck seems to pop. Addressed shoulder first, now coming for vertigo. Had neck surgery about 5-6 years ago .   Currently in Pain? Yes  dizziness    Pain Score 2   dizziness             [facility] PT Assessment - 12/10/14 0001    Observation/Other Assessments   Observations cervical extension/rotation with compression negative both sides  [facility] Adult PT Treatment/Exercise - 12/10/14 0001    Neck Exercises: Seated   Other Seated Exercise 3D cervical and thoracic excursions 1x15   Other Seated Exercise --   Manual Therapy   Manual Therapy Joint mobilization   Joint Mobilization First rib mobilizations x3 bilaterally grade 3          Vestibular Treatment/Exercise - 12/10/14 0001    Eye/Head Exercise Vertical   Comment visual tracking with head still on horizontal, vertical, diagonal planes; accomodation exercise with bilateral eyes and unilateral eyes- dizziness started with pen 6" from nose with R eye and 8" from nose with L eye. Saccades on horizontal and vertical planes.                PT Education - 12/10/14 1706    Education provided Yes   Education Details education regarding importance of fully  telling MD and eye doctor full extent of symptoms    Person(s) Educated Patient   Methods Explanation   Comprehension Verbalized understanding          PT Short Term Goals - 12/06/14 1714    PT SHORT TERM GOAL #1   Title Patient will experience no more than 5/10 dizziness at all times    Status On-going   PT SHORT TERM GOAL #2   Title Patient will demonstrate cervical ROM to be improved by at least 10 degrees all planes    Status On-going   PT SHORT TERM GOAL #3   Title Patient to demonstrate the ability to correctly and consistently perform HEP, to be updated PRN    Status On-going           PT Long Term Goals - 12/06/14 1723    PT LONG TERM GOAL #1   Title Patient to experience no more than 2/10 dizziness at worst    PT LONG TERM GOAL #2   Title Patient to report that he has been able to return to functional sports activities such as baseball with no increase in dizziness    PT LONG TERM GOAL #3   Title Cervical ROM to be within normal limits to reduce dizziness and reduce chances of overall recurrence of condition   PT LONG TERM GOAL #4   Title Patient to demonstrate improved balance and proprioception as evidenced by an ability to maintain SLS on foam pad with eyes closed for at least 20 seconds each leg                Plan - 12/10/14 1707    Clinical Impression Statement Session continued to focus on gaze stabilization, cervical mobility; patient does report that he notices increased blurriness in his vision as dizziness increases. Very significant amounts of dizziness noted when visual stabilzation exercises performed with U eyes today. Dizziness appears to be improving however visiion blurrieness remains large concern for patient. Strongly encouraged patient to fully tell MDs the full extent of his symptoms in order to optimize plan of care moving forward.  Contiue to notice large cervical range improvements after first rib mobs.    Pt will benefit from skilled  therapeutic intervention in order to improve on the following deficits Decreased range of motion;Dizziness;Decreased activity tolerance;Impaired vision/preception;Postural dysfunction   Rehab Potential Good   PT Frequency 2x / week   PT Duration 8 weeks   PT Treatment/Interventions ADLs/Self Care Home Management;Therapeutic activities;Therapeutic exercise;Balance training;Neuromuscular re-education;Patient/family education;Manual techniques;Vestibular;Visual/perceptual remediation/compensation   PT Next Visit Plan RE-assess next session    PT Home  Exercise Plan updated    Consulted and Agree with Plan of Care Patient        Problem List Patient Active Problem List   Diagnosis Date Noted  . Cerebral infarction, chronic   . Dizzy   . Vertigo 10/14/2014  . 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 Hypertension 10/14/2014  . CVA (cerebral infarction) 10/14/2014  . Impingement syndrome of left shoulder 08/02/2014  . 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 Abdominal pain 06/13/2014  . Rectal bleeding 03/13/2014  . Diabetes type 2, controlled 02/18/2014  . Hyperlipidemia 02/18/2014  . Diabetic peripheral neuropathy 03/28/2013  . Irreducible incisional hernia 11/11/2010    Deniece Ree PT, DPT [facility] [address] [place], [place], [address] Phone: [phone]   Fax:  [phone]

## 2014-12-11 ENCOUNTER — Ambulatory Visit: Payer: 59 | Admitting: Neurology

## 2014-12-12 ENCOUNTER — Encounter: Payer: Self-pay | Admitting: Neurology

## 2014-12-12 ENCOUNTER — Ambulatory Visit (HOSPITAL_COMMUNITY): Payer: 59 | Admitting: Physical Therapy

## 2014-12-12 ENCOUNTER — Ambulatory Visit (INDEPENDENT_AMBULATORY_CARE_PROVIDER_SITE_OTHER): Payer: 59 | Admitting: Neurology

## 2014-12-12 ENCOUNTER — Ambulatory Visit (HOSPITAL_COMMUNITY): Payer: 59

## 2014-12-12 VITALS — BP 124/80 | HR 81 | Ht 73.0 in | Wt 189.8 lb

## 2014-12-12 DIAGNOSIS — E785 Hyperlipidemia, unspecified: Secondary | ICD-10-CM | POA: Diagnosis not present

## 2014-12-12 DIAGNOSIS — I10 Essential (primary) hypertension: Secondary | ICD-10-CM

## 2014-12-12 DIAGNOSIS — M542 Cervicalgia: Secondary | ICD-10-CM

## 2014-12-12 DIAGNOSIS — R42 Dizziness and giddiness: Secondary | ICD-10-CM

## 2014-12-12 DIAGNOSIS — E1159 Type 2 diabetes mellitus with other circulatory complications: Secondary | ICD-10-CM | POA: Diagnosis not present

## 2014-12-12 NOTE — Patient Instructions (Addendum)
-   continue ASA 81mg  and pravastatin for stroke prevention - CTA of neck to rule out vertebral artery stenosis - MRI of the neck to evaluate the cervical spine - increase topamax to 50mg  twice a day for headache prevention - continue PT for neck pain - Follow up with your primary care physician for stroke risk factor modification. Recommend maintain blood pressure goal <130/80, diabetes with hemoglobin A1c goal below 6.5% and lipids with LDL cholesterol goal below 70 mg/dL.  - check BP and glucose at home - decrease stress level and anxiety level - follow up in one month
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
## 2014-12-14 DIAGNOSIS — I10 Essential (primary) hypertension: Secondary | ICD-10-CM | POA: Insufficient documentation

## 2014-12-14 DIAGNOSIS — E782 Mixed hyperlipidemia: Secondary | ICD-10-CM | POA: Insufficient documentation

## 2014-12-14 DIAGNOSIS — M542 Cervicalgia: Secondary | ICD-10-CM | POA: Insufficient documentation

## 2014-12-14 DIAGNOSIS — E1159 Type 2 diabetes mellitus with other circulatory complications: Secondary | ICD-10-CM | POA: Insufficient documentation

## 2014-12-17 ENCOUNTER — Encounter (HOSPITAL_COMMUNITY): Payer: Self-pay

## 2014-12-17 ENCOUNTER — Ambulatory Visit (HOSPITAL_COMMUNITY): Payer: 59 | Admitting: Physical Therapy

## 2014-12-17 ENCOUNTER — Ambulatory Visit (HOSPITAL_COMMUNITY): Payer: 59

## 2014-12-17 DIAGNOSIS — M436 Torticollis: Secondary | ICD-10-CM | POA: Diagnosis not present

## 2014-12-17 DIAGNOSIS — R29898 Other symptoms and signs involving the musculoskeletal system: Secondary | ICD-10-CM

## 2014-12-17 DIAGNOSIS — R2681 Unsteadiness on feet: Secondary | ICD-10-CM

## 2014-12-17 DIAGNOSIS — R42 Dizziness and giddiness: Secondary | ICD-10-CM

## 2014-12-17 NOTE — Therapy (Signed)
[facility] [address] [place], [place], [address] Phone: [phone]   Fax:  [phone]  Physical Therapy Treatment (Re-Assessment)  Patient Details  Name: Travis [name] MRN: [id number] Date of Birth: 22-Aug-1960 Referring Provider:  Kathyrn Drown, MD  Encounter Date: 12/17/2014      PT End of Session - 12/17/14 1743    Visit Number 7   Number of Visits 16   Date for PT Re-Evaluation 01/07/15   Authorization Type UMR   Authorization Time Period 11/12/14 to 01/13/15   PT Start Time 1650   PT Stop Time 1731   PT Time Calculation (min) 41 min   Activity Tolerance Patient tolerated treatment well   Behavior During Therapy [facility] for tasks assessed/performed      Past Medical History  Diagnosis Date  . Night sweats     every once in a while  . Hyperlipidemia   . Diabetes mellitus   . Hernia, inguinal   . Dizziness   . Chronic headaches   . Arthritis   . Neuropathy of lower extremity   . Diabetic peripheral neuropathy 03/28/2013  . Diverticulosis   . Hypertension   . Complication of anesthesia     bleed after last shoulder-had to stay overnight  . Impingement syndrome of left shoulder 08/02/2014  . 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 Multi-infarct state 10/14/2014    Past Surgical History  Procedure Laterality Date  . Inguinal hernia repair Bilateral   . Cervical disc surgery    . Wrist surgery Right     fusion  . Shoulder arthroscopy Right     1  . Knee arthroscopy Right   . Colon surgery  2003    1/3 removed for diverticulitis  . Colonoscopy    . Umbilical hernia repair      with other hernia repair with mesh  . Shoulder arthroscopy Left 08/02/2014    Procedure: LEFT SHOULDER SCOPE DEBRIDEMENT/ACROMIOPLASTY;  Surgeon: [name], MD;  Location: [facility];  Service: Orthopedics;  Laterality: Left;  ANESTHESIA: GENERAL, PRE/POST OP SCALENE    There were no vitals filed for this  visit.  Visit Diagnosis:  Stiffness of cervical spine  Unsteadiness  Vertigo      Subjective Assessment - 12/17/14 1651    Subjective patient reports that he was very pleased with how thorough his recent appointment was with new neurologist; reports that he is scheduled to have some MRIs and more tests done and some follow ups with this MD. 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 Did a lot of yardwork this weekend, did not do a lot of exercise besides eye exercises but does report that he had some muscle soreness around his eyes that even neeeded ice pack.  Continuing to deal with significant vision issues.    Pertinent History Vertigo started about 2 years ago; just came on out of nowhere. Right now dizziness is 10/10. Seems like every time it sets off, something in neck seems to pop. Addressed shoulder first, now coming for vertigo. Had neck surgery about 5-6 years ago .   Currently in Pain? No/denies   Pain Score 4   dizziness 4/10            [facility] PT Assessment - 12/17/14 0001    Observation/Other Assessments   Observations significant tightness noted bilateral 1st ribs; positive agraphesthesia; negative astereognosis;  mild RAM impairment L greater than R    AROM   Cervical Flexion 56   Cervical Extension 39  pain    Cervical - Right Side Bend 55   Cervical - Left Side Bend 51   Cervical - Right Rotation 64   Cervical - Left Rotation 64  pain                       Vestibular Treatment/Exercise - 12/17/14 0001    Eye/Head Exercise Vertical   Comment assessment of visual tracking; increased dizziness and nystagmus with diagonal planes also increaed dizziness                PT Education - 12/17/14 1739    Education provided Yes   Education Details progress with skilled PT services, plan of care moving forward, updated HEP    Person(s) Educated Patient   Methods  Explanation   Comprehension Verbalized understanding          PT Short Term Goals - 12/17/14 1750    PT SHORT TERM GOAL #1   Title Patient will experience no more than 5/10 dizziness at all times    Time 4   Period Weeks   Status On-going   PT SHORT TERM GOAL #2   Title Patient will demonstrate cervical ROM to be improved by at least 10 degrees all planes    Time 4   Period Weeks   Status On-going   PT SHORT TERM GOAL #3   Title Patient to demonstrate the ability to correctly and consistently perform HEP, to be updated PRN    Time 4   Period Weeks   Status Achieved           PT Long Term Goals - 12/17/14 1751    PT LONG TERM GOAL #1   Title Patient to experience no more than 2/10 dizziness at worst    Time 8   Period Weeks   Status On-going   PT LONG TERM GOAL #2   Title Patient to report that he has been able to return to functional sports activities such as baseball with no increase in dizziness    Time 8   Period Weeks   Status On-going   PT LONG TERM GOAL #3   Title Cervical ROM to be within normal limits to reduce dizziness and reduce chances of overall recurrence of condition   Time 8   Period Weeks   Status On-going   PT LONG TERM GOAL #4   Title Patient to demonstrate improved balance and proprioception as evidenced by an ability to maintain SLS on foam pad with eyes closed for at least 20 seconds each leg    Time 8   Period Weeks   Status On-going               Plan - 12/17/14 1744    Clinical Impression Statement Re-assessment performed today. Patient continues to be limited in cervical mobility, continues to expereince dizziness and visual changes, and also appears to be under an enourmous amount of stress and anxiety today. Patient has in the past reported that he feels that he is improving however  today appeared to be a rough day with increased symptoms. Patient was provided with updated HEP to promote relaxation. At this time recommend  continuing skilled PT services  with focus on improving vertigo, cervical ROM, and overall relaxation techniques.    Pt will benefit from skilled therapeutic intervention in  order to improve on the following deficits Decreased range of motion;Dizziness;Decreased activity tolerance;Impaired vision/preception;Postural dysfunction   Rehab Potential Good   PT Frequency 2x / week   PT Duration 4 weeks   PT Treatment/Interventions ADLs/Self Care Home Management;Therapeutic activities;Therapeutic exercise;Balance training;Neuromuscular re-education;Patient/family education;Manual techniques;Vestibular;Visual/perceptual remediation/compensation   PT Next Visit Plan Continue cervical ROM, visual tracking exercises, relaxation techniques; introduce balance work    PT Home Exercise Plan updated    Consulted and Agree with Plan of Care Patient        Problem List Patient Active Problem List   Diagnosis Date Noted  . Essential hypertension 12/14/2014  . Type 2 diabetes mellitus with other circulatory complications 22/63/3354  . HLD (hyperlipidemia) 12/14/2014  . Neck pain 12/14/2014  . Cerebral infarction, chronic   . Dizzy   . Vertigo 10/14/2014  . 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 Hypertension 10/14/2014  . CVA (cerebral infarction) 10/14/2014  . Impingement syndrome of left shoulder 08/02/2014  . 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 Abdominal pain 06/13/2014  . Rectal bleeding 03/13/2014  . Diabetes type 2, controlled 02/18/2014  . Hyperlipidemia 02/18/2014  . Diabetic peripheral neuropathy 03/28/2013  . Irreducible incisional hernia 11/11/2010   Physical Therapy Progress Note  Dates of Reporting Period: 11/12/14 to 12/17/14  Objective Reports of Subjective Statement: patient continues to be limited by dizziness, also reports that his balance continues to be impaired and that he has trouble gripping items. Appears to have very high levels of stress and anxiety.   Objective  Measurements: see above   Goal Update: see above   Plan: see above   Reason Skilled Services are Required: address vertigo, reduce cervical and first rib hypomobility, improve posture, address balance, promote physical relaxation techniques for overall health      Deniece Ree PT, DPT [place], [place], [address] Phone: [phone]   Fax:  [phone]

## 2014-12-17 NOTE — Patient Instructions (Signed)
   DIAPHRAMATIC BREATHING  While lying down on your back, place one hand on your breast bone and one hand on your abdomen near your navel.   Slowly take a deep breath in and focus on trying to get your hand on your stomach rise while the hand on your breast bone remains still.   As you breathe in, the hand on your stomach should rise.  When you breath out, the hand on your stomach should lower.  Repeat 30 times, at least 3 times per day.      SHRUGS  Raise your shoulders upward towards your ears as shown. Shrug both shoulders at the same time, breathing in deeply while you do this. Hold for 3-5 seconds, then swiftly relax these muscles, breathing out as you do so.   Repeat 10 times, 2-3 times per day.

## 2014-12-17 NOTE — Therapy (Signed)
[place], [place], [address] Phone: [phone]   Fax:  [phone]  Occupational Therapy Treatment  Patient Details  Name: Travis [name] MRN: [id number] Date of Birth: 1960/12/10 Referring Provider:  Kathyrn Drown, MD  Encounter Date: 12/17/2014      OT End of Session - 12/17/14 1800    Visit Number 23   Number of Visits 24   Date for OT Re-Evaluation 12/23/14   Authorization Type UMR no visit limit   OT Start Time 1603   OT Stop Time 1645   OT Time Calculation (min) 42 min   Activity Tolerance Patient tolerated treatment well   Behavior During Therapy [facility] for tasks assessed/performed      Past Medical History  Diagnosis Date  . Night sweats     every once in a while  . Hyperlipidemia   . Diabetes mellitus   . Hernia, inguinal   . Dizziness   . Chronic headaches   . Arthritis   . Neuropathy of lower extremity   . Diabetic peripheral neuropathy 03/28/2013  . Diverticulosis   . Hypertension   . Complication of anesthesia     bleed after last shoulder-had to stay overnight  . Impingement syndrome of left shoulder 08/02/2014  . 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 Multi-infarct state 10/14/2014    Past Surgical History  Procedure Laterality Date  . Inguinal hernia repair Bilateral   . Cervical disc surgery    . Wrist surgery Right     fusion  . Shoulder arthroscopy Right     1  . Knee arthroscopy Right   . Colon surgery  2003    1/3 removed for diverticulitis  . Colonoscopy    . Umbilical hernia repair      with other hernia repair with mesh  . Shoulder arthroscopy Left 08/02/2014    Procedure: LEFT SHOULDER SCOPE DEBRIDEMENT/ACROMIOPLASTY;  Surgeon: [name], MD;  Location: [place];  Service: Orthopedics;  Laterality: Left;  ANESTHESIA: GENERAL, PRE/POST OP SCALENE    There were no vitals filed for this visit.  Visit Diagnosis:  Shoulder weakness       Subjective Assessment - 12/17/14 1621    Subjective  S: My shoulder is doing good but everything else is going down the drain.   Currently in Pain? No/denies                      OT Treatments/Exercises (OP) - 12/17/14 1623    Exercises   Exercises Shoulder   Shoulder Exercises: Prone   Other Prone Exercises [place] exercises 3# 15X all positions   Other Prone Exercises Face Down Field goal; 3#; 15X   Shoulder Exercises: Standing   Other Standing Exercises scapular retraction standing at wall; green band; 12X   Shoulder Exercises: ROM/Strengthening   UBE (Upper Arm Bike) AirDyne Bike; 3' forward 3' reverse   Cybex Press 3 plate;15 reps   Cybex Row 3 plate;15 reps   Pushups 10 reps   Pushups Limitations modified on knees   Plank 30 seconds;2 reps  on Bosu ball   Other ROM/Strengthening Exercises Scapular raises; prone; 15X; 3#                OT Short Term Goals - 10/29/14 1701    OT SHORT TERM GOAL #1   Title Patient will be educated on a HEP.   OT SHORT TERM GOAL #2  Title Patient will decrease LUE pain to a 4/10 or less during daily tasks.    OT SHORT TERM GOAL #3   Title Patient will increase PROM to [facility] to increase ability to complete dressing tasks.   OT SHORT TERM GOAL #4   Title Patient will increase LUE strength to 3/5 to increase ability to reach overhead.   OT SHORT TERM GOAL #5   Title Patient will decrease fascial restrctions from mod to min amount.           OT Long Term Goals - 10/29/14 1701    OT LONG TERM GOAL #1   Title Patient will return to highest level of independence with all daily, leisure, and work tasks.    Time 8   Period Weeks   Status On-going   OT LONG TERM GOAL #2   Title Patient will decrease pain to 2/10 or less during daily tasks.    Time 8   Period Weeks   OT LONG TERM GOAL #3   Title Patient will increase AROM of LUE to WNL to increase ability to complete overhead tasks at work.    Time 8   Period  Weeks   OT LONG TERM GOAL #4   Title Patient will increase LUE strength to 5/5 to increase ability to change and lift tires at work.    Time 8   Period Weeks   Status On-going   OT LONG TERM GOAL #5   Title Patient will decrease fascial restrictions to trace amount.    Time 8   Period Weeks               Plan - 12/17/14 1803    Clinical Impression Statement A: Added push ups and plank holds to increase shoulder stability. Pt tolerated well.    Plan P: Reassess for possible discharge.         Problem List Patient Active Problem List   Diagnosis Date Noted  . Essential hypertension 12/14/2014  . Type 2 diabetes mellitus with other circulatory complications 01/74/9449  . HLD (hyperlipidemia) 12/14/2014  . Neck pain 12/14/2014  . Cerebral infarction, chronic   . Dizzy   . Vertigo 10/14/2014  . 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 Hypertension 10/14/2014  . CVA (cerebral infarction) 10/14/2014  . Impingement syndrome of left shoulder 08/02/2014  . 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 Abdominal pain 06/13/2014  . Rectal bleeding 03/13/2014  . Diabetes type 2, controlled 02/18/2014  . Hyperlipidemia 02/18/2014  . Diabetic peripheral neuropathy 03/28/2013  . Irreducible incisional hernia 11/11/2010    Ailene Ravel, OTR/L,CBIS  [phone]  12/17/2014, 6:06 PM  [place] [address] [place], [place], [address] Phone: [phone]   Fax:  [phone]

## 2014-12-19 ENCOUNTER — Ambulatory Visit (HOSPITAL_COMMUNITY): Payer: 59 | Admitting: Physical Therapy

## 2014-12-19 ENCOUNTER — Encounter (HOSPITAL_COMMUNITY): Payer: Self-pay

## 2014-12-19 ENCOUNTER — Ambulatory Visit (HOSPITAL_COMMUNITY): Payer: 59

## 2014-12-19 DIAGNOSIS — R29898 Other symptoms and signs involving the musculoskeletal system: Secondary | ICD-10-CM

## 2014-12-19 DIAGNOSIS — M436 Torticollis: Secondary | ICD-10-CM | POA: Diagnosis not present

## 2014-12-19 DIAGNOSIS — M25612 Stiffness of left shoulder, not elsewhere classified: Secondary | ICD-10-CM

## 2014-12-19 DIAGNOSIS — R2681 Unsteadiness on feet: Secondary | ICD-10-CM

## 2014-12-19 DIAGNOSIS — R42 Dizziness and giddiness: Secondary | ICD-10-CM

## 2014-12-19 NOTE — Therapy (Signed)
[place], [place], [address] Phone: [phone]   Fax:  [phone]  Occupational Therapy Treatment  Patient Details  Name: Travis [name] MRN: [id number] Date of Birth: December 09, 1960 Referring Provider:  [name], MD  Encounter Date: 12/19/2014      OT End of Session - 12/19/14 1713    Visit Number 24   Number of Visits 24   Date for OT Re-Evaluation 12/23/14   Authorization Type UMR no visit limit   OT Start Time 1608   OT Stop Time 1649   OT Time Calculation (min) 41 min   Activity Tolerance Patient tolerated treatment well   Behavior During Therapy [facility] for tasks assessed/performed      Past Medical History  Diagnosis Date  . Night sweats     every once in a while  . Hyperlipidemia   . Diabetes mellitus   . Hernia, inguinal   . Dizziness   . Chronic headaches   . Arthritis   . Neuropathy of lower extremity   . Diabetic peripheral neuropathy 03/28/2013  . Diverticulosis   . Hypertension   . Complication of anesthesia     bleed after last shoulder-had to stay overnight  . Impingement syndrome of left shoulder 08/02/2014  . 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 Multi-infarct state 10/14/2014    Past Surgical History  Procedure Laterality Date  . Inguinal hernia repair Bilateral   . Cervical disc surgery    . Wrist surgery Right     fusion  . Shoulder arthroscopy Right     1  . Knee arthroscopy Right   . Colon surgery  2003    1/3 removed for diverticulitis  . Colonoscopy    . Umbilical hernia repair      with other hernia repair with mesh  . Shoulder arthroscopy Left 08/02/2014    Procedure: LEFT SHOULDER SCOPE DEBRIDEMENT/ACROMIOPLASTY;  Surgeon: [name], MD;  Location: [place];  Service: Orthopedics;  Laterality: Left;  ANESTHESIA: GENERAL, PRE/POST OP SCALENE    There were no vitals filed for this visit.  Visit Diagnosis:  Shoulder  weakness  Shoulder stiffness, left      Subjective Assessment - 12/19/14 1611    Subjective  S: I'm dropping a lot of this wit this lef tarm but I think it's related to my neck issues.   Special Tests FOTO score: 65/100 (35% impaired)   Currently in Pain? Yes   Pain Score 2    Pain Location Shoulder   Pain Orientation Left   Pain Descriptors / Indicators Sore   Pain Type Acute pain            [facility] OT Assessment - 12/19/14 1611    Assessment   Diagnosis Left shoulder arthroscopy   Precautions   Precautions Shoulder   Type of Shoulder Precautions Progress as tolerated   AROM   Overall AROM Comments assessed in standing, ER/IR with shoulder abducted    AROM Assessment Site Shoulder   Right/Left Shoulder Left   Left Shoulder Flexion 160 Degrees  previous: 150   Left Shoulder ABduction 165 Degrees  previous: 155   Left Shoulder Internal Rotation 82 Degrees  previous: 75   Left Shoulder External Rotation 90 Degrees  previous: 75   Strength   Overall Strength Comments Assessed seated. IR/ER abducted   Strength Assessment Site Shoulder   Right/Left Shoulder Left   Left Shoulder Flexion 4+/5  previous: 4-/5  Left Shoulder ABduction 4+/5  previous: 4-/5   Left Shoulder Internal Rotation 4/5  previous: 4/5   Left Shoulder External Rotation 4+/5  previous: 4+/5                  OT Treatments/Exercises (OP) - 12/19/14 1635    Exercises   Exercises Shoulder   Shoulder Exercises: Supine   Protraction PROM;5 reps   Horizontal ABduction PROM;5 reps   External Rotation PROM;5 reps   Internal Rotation PROM;5 reps   Flexion PROM;5 reps   ABduction PROM;5 reps   Shoulder Exercises: Seated   Horizontal ABduction Theraband;15 reps   Theraband Level (Shoulder Horizontal ABduction) Level 4 (Blue)   External Rotation Theraband;15 reps   Theraband Level (Shoulder External Rotation) Level 4 (Blue)   Internal Rotation Theraband;15 reps   Theraband Level (Shoulder  Internal Rotation) Level 4 (Blue)   Abduction Theraband;15 reps   Theraband Level (Shoulder ABduction) Level 4 (Blue)             Balance Exercises - 12/19/14 1734    Balance Exercises: Standing   Tandem Stance 2 reps  R foot forward 39", L foot foward 48"   SLS with Vectors Foam/compliant surface;2 reps  cone taps           OT Education - 12/19/14 1712    Education provided Yes   Education Details theraband exercises with blue band.   Person(s) Educated Patient   Methods Explanation;Demonstration;Handout   Comprehension Returned demonstration;Verbalized understanding          OT Short Term Goals - 10/29/14 1701    OT SHORT TERM GOAL #1   Title Patient will be educated on a HEP.   OT SHORT TERM GOAL #2   Title Patient will decrease LUE pain to a 4/10 or less during daily tasks.    OT SHORT TERM GOAL #3   Title Patient will increase PROM to [facility] to increase ability to complete dressing tasks.   OT SHORT TERM GOAL #4   Title Patient will increase LUE strength to 3/5 to increase ability to reach overhead.   OT SHORT TERM GOAL #5   Title Patient will decrease fascial restrctions from mod to min amount.           OT Long Term Goals - 12/19/14 1629    OT LONG TERM GOAL #1   Title Patient will return to highest level of independence with all daily, leisure, and work tasks.    Time 8   Period Weeks   Status Achieved   OT LONG TERM GOAL #2   Title Patient will decrease pain to 2/10 or less during daily tasks.    Time 8   Period Weeks   OT LONG TERM GOAL #3   Title Patient will increase AROM of LUE to WNL to increase ability to complete overhead tasks at work.    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    Time 8   Period Weeks   Status Partially Met   OT LONG TERM GOAL #5   Title Patient will decrease fascial restrictions to trace amount.    Time 8    Period Weeks               Plan - 12/19/14 1715    Clinical Impression Statement A: Reassessment and  discharge completed this date. patient met all but one therapy goal related to strength. pt was given an updated HEP including blue theraband and reviewed shoulder stretches to complete at home. pt in agreement with discharge.    Plan P: D/C from therapy with HEP.        Problem List Patient Active Problem List   Diagnosis Date Noted  . Essential hypertension 12/14/2014  . Type 2 diabetes mellitus with other circulatory complications 94/85/4627  . HLD (hyperlipidemia) 12/14/2014  . Neck pain 12/14/2014  . Cerebral infarction, chronic   . Dizzy   . Vertigo 10/14/2014  . 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 Hypertension 10/14/2014  . CVA (cerebral infarction) 10/14/2014  . Impingement syndrome of left shoulder 08/02/2014  . 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 Abdominal pain 06/13/2014  . Rectal bleeding 03/13/2014  . Diabetes type 2, controlled 02/18/2014  . Hyperlipidemia 02/18/2014  . Diabetic peripheral neuropathy 03/28/2013  . Irreducible incisional hernia 11/11/2010    OCCUPATIONAL THERAPY DISCHARGE SUMMARY  Visits from Start of Care: 24  Current functional level related to goals / functional outcome See goals above   Remaining deficits: Pt did not reach strength goal of 5/5 although his strength has improved greatly since the first day of therapy.    Education / Equipment: Shoulder stretches and theraband exercises (blue level)  Plan: Patient agrees to discharge.  Patient goals were met. Patient is being discharged due to meeting the stated rehab goals.  ?????       Ailene Ravel, OTR/L,CBIS  [phone]  12/19/2014, 5:37 PM  [place] [address] [place], [place], [address] Phone: [phone]   Fax:  [phone]

## 2014-12-19 NOTE — Patient Instructions (Signed)
Strengthening: Chest Pull - Resisted   Hold Theraband in front of body with hands about shoulder width a part. Pull band a part and back together slowly. Repeat _15-20___ times.     PNF Strengthening: Resisted   Standing with resistive band around each hand, bring right arm up and away, thumb back. Repeat __15-20__ times per set.  http://orth.exer.us/918   Copyright  VHI. All rights reserved.   PNF Strengthening: Resisted   Standing with resistive band around each hand, bring right arm up and across body. Repeat _15-20___ times per set.   http://orth.exer.us/920   Copyright  VHI. All rights reserved.    Resisted External Rotation: in Neutral - Bilateral   Sit or stand, tubing in both hands, elbows at sides, bent to 90, forearms forward. Pinch shoulder blades together and rotate forearms out. Keep elbows at sides. Repeat _15-20___ times per set.  http://orth.exer.us/966   Copyright  VHI. All rights reserved.   PNF Strengthening: Resisted   Standing, hold resistive band above head. Bring right arm down and out from side. Repeat _15-20___ times per set.

## 2014-12-19 NOTE — Therapy (Signed)
[place], [place], [address] Phone: [phone]   Fax:  [phone]  Physical Therapy Treatment  Patient Details  Name: Travis [name] MRN: [id number] Date of Birth: 05-12-1960 Referring Provider:  Rosalin Hawking, MD  Encounter Date: 12/19/2014      PT End of Session - 12/19/14 1736    Visit Number 8   Number of Visits 16   Date for PT Re-Evaluation 01/07/15   Authorization Type UMR   Authorization Time Period 11/12/14 to 01/13/15   PT Start Time 1648   PT Stop Time 1733   PT Time Calculation (min) 45 min   Activity Tolerance Patient tolerated treatment well   Behavior During Therapy [facility] for tasks assessed/performed      Past Medical History  Diagnosis Date  . Night sweats     every once in a while  . Hyperlipidemia   . Diabetes mellitus   . Hernia, inguinal   . Dizziness   . Chronic headaches   . Arthritis   . Neuropathy of lower extremity   . Diabetic peripheral neuropathy 03/28/2013  . Diverticulosis   . Hypertension   . Complication of anesthesia     bleed after last shoulder-had to stay overnight  . Impingement syndrome of left shoulder 08/02/2014  . 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 Multi-infarct state 10/14/2014    Past Surgical History  Procedure Laterality Date  . Inguinal hernia repair Bilateral   . Cervical disc surgery    . Wrist surgery Right     fusion  . Shoulder arthroscopy Right     1  . Knee arthroscopy Right   . Colon surgery  2003    1/3 removed for diverticulitis  . Colonoscopy    . Umbilical hernia repair      with other hernia repair with mesh  . Shoulder arthroscopy Left 08/02/2014    Procedure: LEFT SHOULDER SCOPE DEBRIDEMENT/ACROMIOPLASTY;  Surgeon: [name], MD;  Location: [facility];  Service: Orthopedics;  Laterality: Left;  ANESTHESIA: GENERAL, PRE/POST OP SCALENE    There were no vitals filed for this visit.  Visit  Diagnosis:  Shoulder weakness  Stiffness of cervical spine  Unsteadiness  Vertigo      Subjective Assessment - 12/19/14 1653    Subjective Pt reports that he continues to have increased dizziness, and he feels like he always veers to the left when he walks.    Pain Score 5    Pain Location Neck                         [facility] Adult PT Treatment/Exercise - 12/19/14 1645    Manual Therapy   Manual Therapy Joint mobilization;Manual Traction   Joint Mobilization First rib mobilizations x3 bilaterally grade 3 , massage uplift to cervical spine x 3 bilat   Manual Traction 3x45"             Balance Exercises - 12/19/14 1734    Balance Exercises: Standing   Tandem Stance 2 reps  R foot forward 39", L foot foward 48"   SLS with Vectors Foam/compliant surface;2 reps  cone taps             PT Short Term Goals - 12/17/14 1750    PT SHORT TERM GOAL #1   Title Patient will experience no more than 5/10 dizziness at all times    Time 4  Period Weeks   Status On-going   PT SHORT TERM GOAL #2   Title Patient will demonstrate cervical ROM to be improved by at least 10 degrees all planes    Time 4   Period Weeks   Status On-going   PT SHORT TERM GOAL #3   Title Patient to demonstrate the ability to correctly and consistently perform HEP, to be updated PRN    Time 4   Period Weeks   Status Achieved           PT Long Term Goals - 12/17/14 1751    PT LONG TERM GOAL #1   Title Patient to experience no more than 2/10 dizziness at worst    Time 8   Period Weeks   Status On-going   PT LONG TERM GOAL #2   Title Patient to report that he has been able to return to functional sports activities such as baseball with no increase in dizziness    Time 8   Period Weeks   Status On-going   PT LONG TERM GOAL #3   Title Cervical ROM to be within normal limits to reduce dizziness and reduce chances of overall recurrence of condition   Time 8   Period Weeks    Status On-going   PT LONG TERM GOAL #4   Title Patient to demonstrate improved balance and proprioception as evidenced by an ability to maintain SLS on foam pad with eyes closed for at least 20 seconds each leg    Time 8   Period Weeks   Status On-going               Plan - 12/19/14 1737    Clinical Impression Statement Manual therapy continued today with first rib mobilizations, massage uplift, manual traction, and suboccipital release. Pt reported significant decrease in pain levels following manual therapy, reported that it helped him to relax. During first rib mobilizations, PT felt scalene activation with breathing. Pt was educated on belly breathing to decrease accessory muscle activiation and to decrease neck pain. Balance training was incorporated today, pt responded well and was able to complete dynamic activity on foam without LOB.    PT Next Visit Plan Continue with manual therapy, cervical ROM, visual  tracking, SLS activities on foam.        Problem List Patient Active Problem List   Diagnosis Date Noted  . Essential hypertension 12/14/2014  . Type 2 diabetes mellitus with other circulatory complications 91/50/5697  . HLD (hyperlipidemia) 12/14/2014  . Neck pain 12/14/2014  . Cerebral infarction, chronic   . Dizzy   . Vertigo 10/14/2014  . 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 Hypertension 10/14/2014  . CVA (cerebral infarction) 10/14/2014  . Impingement syndrome of left shoulder 08/02/2014  . 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 Abdominal pain 06/13/2014  . Rectal bleeding 03/13/2014  . Diabetes type 2, controlled 02/18/2014  . Hyperlipidemia 02/18/2014  . Diabetic peripheral neuropathy 03/28/2013  . Irreducible incisional hernia 11/11/2010    Hilma Favors, PT, DPT [phone] 12/19/2014, 5:41 PM  [place] [address] [place], [place], [address] Phone: [phone]   Fax:   [phone]

## 2014-12-23 ENCOUNTER — Telehealth: Payer: Self-pay

## 2014-12-23 NOTE — Telephone Encounter (Signed)
Fax outpatient referral form sign by Dr. Erlinda Hong to [facility] at [phone] for patient.

## 2014-12-24 ENCOUNTER — Encounter (HOSPITAL_COMMUNITY): Payer: 59

## 2014-12-24 ENCOUNTER — Ambulatory Visit (HOSPITAL_COMMUNITY): Payer: 59 | Admitting: Physical Therapy

## 2014-12-24 DIAGNOSIS — R2681 Unsteadiness on feet: Secondary | ICD-10-CM

## 2014-12-24 DIAGNOSIS — M436 Torticollis: Secondary | ICD-10-CM | POA: Diagnosis not present

## 2014-12-24 DIAGNOSIS — R42 Dizziness and giddiness: Secondary | ICD-10-CM

## 2014-12-24 NOTE — Therapy (Signed)
[place], [place], [address] Phone: [phone]   Fax:  [phone]  Physical Therapy Treatment  Patient Details  Name: Travis [name] MRN: [id number] Date of Birth: 1961/01/28 Referring Provider:  Kathyrn Drown, MD  Encounter Date: 12/24/2014      PT End of Session - 12/24/14 1741    Visit Number 9   Number of Visits 16   Date for PT Re-Evaluation 01/07/15   Authorization Type UMR   Authorization Time Period 11/12/14 to 01/13/15   PT Start Time 1651   PT Stop Time 1736   PT Time Calculation (min) 45 min   Activity Tolerance Patient tolerated treatment well   Behavior During Therapy [facility] for tasks assessed/performed      Past Medical History  Diagnosis Date  . Night sweats     every once in a while  . Hyperlipidemia   . Diabetes mellitus   . Hernia, inguinal   . Dizziness   . Chronic headaches   . Arthritis   . Neuropathy of lower extremity   . Diabetic peripheral neuropathy 03/28/2013  . Diverticulosis   . Hypertension   . Complication of anesthesia     bleed after last shoulder-had to stay overnight  . Impingement syndrome of left shoulder 08/02/2014  . 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 Multi-infarct state 10/14/2014    Past Surgical History  Procedure Laterality Date  . Inguinal hernia repair Bilateral   . Cervical disc surgery    . Wrist surgery Right     fusion  . Shoulder arthroscopy Right     1  . Knee arthroscopy Right   . Colon surgery  2003    1/3 removed for diverticulitis  . Colonoscopy    . Umbilical hernia repair      with other hernia repair with mesh  . Shoulder arthroscopy Left 08/02/2014    Procedure: LEFT SHOULDER SCOPE DEBRIDEMENT/ACROMIOPLASTY;  Surgeon: [name], MD;  Location: [place];  Service: Orthopedics;  Laterality: Left;  ANESTHESIA: GENERAL, PRE/POST OP SCALENE    There were no vitals filed for this visit.  Visit  Diagnosis:  Vertigo  Unsteadiness  Stiffness of cervical spine      Subjective Assessment - 12/24/14 1653    Subjective Patient reports that he is feeling somewhat woozy today, reports that he felt well after last session    Pertinent History Vertigo started about 2 years ago; just came on out of nowhere. Right now dizziness is 10/10. Seems like every time it sets off, something in neck seems to pop. Addressed shoulder first, now coming for vertigo. Had neck surgery about 5-6 years ago .   Currently in Pain? Yes   Pain Score 3   3/10 dizziness today                         [facility] Adult PT Treatment/Exercise - 12/24/14 0001    Neck Exercises: Seated   Neck Retraction 15 reps   Neck Retraction Limitations cues for form    Other Seated Exercise 3D thoracic and cervical excursions 1x15    Other Seated Exercise Posterior shoulder rolls 1x20; scapular retraction 1x15; belly breathing 1x20         Vestibular Treatment/Exercise - 12/24/14 0001    Eye/Head Exercise Vertical   Comment visual tracking on vertical, horizontal, diagonal planes; accomodation exercise; saccades  Balance Exercises - 12/24/14 1739    Balance Exercises: Standing   SLS Eyes open;Foam/compliant surface;4 reps;10 secs  intermittent HHA    Rockerboard Lateral;10 reps  no HHA            PT Education - 12/24/14 1740    Education provided Yes   Education Details patient strongly encouraged to incorporate deep breathing execises into his day as he begins to feel stressed in order to avoid increases in BP and symptomology    Person(s) Educated Patient   Methods Explanation   Comprehension Verbalized understanding          PT Short Term Goals - 12/17/14 1750    PT SHORT TERM GOAL #1   Title Patient will experience no more than 5/10 dizziness at all times    Time 4   Period Weeks   Status On-going   PT SHORT TERM GOAL #2   Title Patient will demonstrate cervical ROM to  be improved by at least 10 degrees all planes    Time 4   Period Weeks   Status On-going   PT SHORT TERM GOAL #3   Title Patient to demonstrate the ability to correctly and consistently perform HEP, to be updated PRN    Time 4   Period Weeks   Status Achieved           PT Long Term Goals - 12/17/14 1751    PT LONG TERM GOAL #1   Title Patient to experience no more than 2/10 dizziness at worst    Time 8   Period Weeks   Status On-going   PT LONG TERM GOAL #2   Title Patient to report that he has been able to return to functional sports activities such as baseball with no increase in dizziness    Time 8   Period Weeks   Status On-going   PT LONG TERM GOAL #3   Title Cervical ROM to be within normal limits to reduce dizziness and reduce chances of overall recurrence of condition   Time 8   Period Weeks   Status On-going   PT LONG TERM GOAL #4   Title Patient to demonstrate improved balance and proprioception as evidenced by an ability to maintain SLS on foam pad with eyes closed for at least 20 seconds each leg    Time 8   Period Weeks   Status On-going               Plan - 12/24/14 1741    Clinical Impression Statement Continued with cervical and thoracic mobility, postural exercises, visual tracking, and balance activities today. Patient demonstrates relatively good form but did today, in particular, demonstrate poor frustration tolerate and became fixated on things from the weekend that really frustrated him, also became frustrated with tasks during sessions that were difficult for him. Patient strongly encouraged to include deep breathing exercises throughout day when he feels himself becoming frustrated to avoid increaeses in BP and other symptoms. Patient also reports he has MRI this coming Friday.    Pt will benefit from skilled therapeutic intervention in order to improve on the following deficits Decreased range of motion;Dizziness;Decreased activity  tolerance;Impaired vision/preception;Postural dysfunction   Rehab Potential Good   PT Frequency 2x / week   PT Duration 4 weeks   PT Treatment/Interventions ADLs/Self Care Home Management;Therapeutic activities;Therapeutic exercise;Balance training;Neuromuscular re-education;Patient/family education;Manual techniques;Vestibular;Visual/perceptual remediation/compensation   PT Next Visit Plan Continue with manual therapy, cervical ROM, visual  tracking, SLS activities on  foam.   PT Home Exercise Plan updated    Consulted and Agree with Plan of Care Patient        Problem List Patient Active Problem List   Diagnosis Date Noted  . Essential hypertension 12/14/2014  . Type 2 diabetes mellitus with other circulatory complications 92/42/6834  . HLD (hyperlipidemia) 12/14/2014  . Neck pain 12/14/2014  . Cerebral infarction, chronic   . Dizzy   . Vertigo 10/14/2014  . 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 Hypertension 10/14/2014  . CVA (cerebral infarction) 10/14/2014  . Impingement syndrome of left shoulder 08/02/2014  . 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 Abdominal pain 06/13/2014  . Rectal bleeding 03/13/2014  . Diabetes type 2, controlled 02/18/2014  . Hyperlipidemia 02/18/2014  . Diabetic peripheral neuropathy 03/28/2013  . Irreducible incisional hernia 11/11/2010    Deniece Ree PT, DPT [place] [address] [place], [place], [address] Phone: [phone]   Fax:  [phone]

## 2014-12-26 ENCOUNTER — Ambulatory Visit (HOSPITAL_COMMUNITY): Payer: 59 | Admitting: Physical Therapy

## 2014-12-26 ENCOUNTER — Other Ambulatory Visit: Payer: Self-pay | Admitting: Family Medicine

## 2014-12-26 ENCOUNTER — Encounter (HOSPITAL_COMMUNITY): Payer: 59

## 2014-12-26 DIAGNOSIS — M436 Torticollis: Secondary | ICD-10-CM | POA: Diagnosis not present

## 2014-12-26 DIAGNOSIS — R2681 Unsteadiness on feet: Secondary | ICD-10-CM

## 2014-12-26 DIAGNOSIS — R42 Dizziness and giddiness: Secondary | ICD-10-CM

## 2014-12-26 NOTE — Therapy (Signed)
[place], [place], [address] Phone: [phone]   Fax:  [phone]  Physical Therapy Treatment  Patient Details  Name: Travis [name] MRN: [id number] Date of Birth: [birth year] Referring Provider:  Kathyrn Drown, MD  Encounter Date: 12/26/2014      PT End of Session - 12/26/14 1740    Visit Number 10   Number of Visits 16   Date for PT Re-Evaluation 01/07/15   Authorization Type UMR   Authorization Time Period 11/12/14 to 01/13/15   PT Start Time 1658   PT Stop Time 1737   PT Time Calculation (min) 39 min   Activity Tolerance Patient tolerated treatment well   Behavior During Therapy [facility] for tasks assessed/performed      Past Medical History  Diagnosis Date  . Night sweats     every once in a while  . Hyperlipidemia   . Diabetes mellitus   . Hernia, inguinal   . Dizziness   . Chronic headaches   . Arthritis   . Neuropathy of lower extremity   . Diabetic peripheral neuropathy 03/28/2013  . Diverticulosis   . Hypertension   . Complication of anesthesia     bleed after last shoulder-had to stay overnight  . Impingement syndrome of left shoulder 08/02/2014  . 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 Multi-infarct state 10/14/2014    Past Surgical History  Procedure Laterality Date  . Inguinal hernia repair Bilateral   . Cervical disc surgery    . Wrist surgery Right     fusion  . Shoulder arthroscopy Right     1  . Knee arthroscopy Right   . Colon surgery  2003    1/3 removed for diverticulitis  . Colonoscopy    . Umbilical hernia repair      with other hernia repair with mesh  . Shoulder arthroscopy Left 08/02/2014    Procedure: LEFT SHOULDER SCOPE DEBRIDEMENT/ACROMIOPLASTY;  Surgeon: [name], MD;  Location: [place];  Service: Orthopedics;  Laterality: Left;  ANESTHESIA: GENERAL, PRE/POST OP SCALENE    There were no vitals filed for this visit.  Visit  Diagnosis:  Vertigo  Unsteadiness  Stiffness of cervical spine      Subjective Assessment - 12/26/14 1700    Subjective Patient reports that it has been a rough day today, feeling just off and very woozy. Feeling very unsteady, seeing a lot of splotches of light and off balance. Took an extra dizziness pill but it has not seemed to help.    Pertinent History Vertigo started about 2 years ago; just came on out of nowhere. Right now dizziness is 10/10. Seems like every time it sets off, something in neck seems to pop. Addressed shoulder first, now coming for vertigo. Had neck surgery about 5-6 years ago .   Currently in Pain? Yes   Pain Score --  dizziness 3/10                         [facility] Adult PT Treatment/Exercise - 12/26/14 0001    Neck Exercises: Seated   Neck Retraction 15 reps   Neck Retraction Limitations cues for form    Other Seated Exercise 3D cervical excursions 1x20   Other Seated Exercise Posterior shoulder rolls 1x20; scapular retraction 1x15   Manual Therapy   Joint Mobilization First rib mobilizations to first rib x3 each side, grade 3; suboccipital release 3x45 seconds  Vestibular Treatment/Exercise - 12/26/14 0001    Eye/Head Exercise Vertical   Comment visual tracking vertical, horizontal, diagonal planes                PT Education - 12/26/14 1740    Education provided Yes   Education Details continued to encourage patient to incorporate deep breathing exercises into day especially as he feels frustrated or stressed    Person(s) Educated Patient   Methods Explanation   Comprehension Verbalized understanding          PT Short Term Goals - 12/17/14 1750    PT SHORT TERM GOAL #1   Title Patient will experience no more than 5/10 dizziness at all times    Time 4   Period Weeks   Status On-going   PT SHORT TERM GOAL #2   Title Patient will demonstrate cervical ROM to be improved by at least 10 degrees all planes     Time 4   Period Weeks   Status On-going   PT SHORT TERM GOAL #3   Title Patient to demonstrate the ability to correctly and consistently perform HEP, to be updated PRN    Time 4   Period Weeks   Status Achieved           PT Long Term Goals - 12/17/14 1751    PT LONG TERM GOAL #1   Title Patient to experience no more than 2/10 dizziness at worst    Time 8   Period Weeks   Status On-going   PT LONG TERM GOAL #2   Title Patient to report that he has been able to return to functional sports activities such as baseball with no increase in dizziness    Time 8   Period Weeks   Status On-going   PT LONG TERM GOAL #3   Title Cervical ROM to be within normal limits to reduce dizziness and reduce chances of overall recurrence of condition   Time 8   Period Weeks   Status On-going   PT LONG TERM GOAL #4   Title Patient to demonstrate improved balance and proprioception as evidenced by an ability to maintain SLS on foam pad with eyes closed for at least 20 seconds each leg    Time 8   Period Weeks   Status On-going               Plan - 12/26/14 1741    Clinical Impression Statement Continued with cervical mobilty, manual to first rib and suboccipital musculature, and visual tracking today. Patient appeared more unsteady than usual and reported that today had been a hard day symptom wise overall. He is receiving his MRI tomorrow. No changes in overall symptoms after treatment today.    Pt will benefit from skilled therapeutic intervention in order to improve on the following deficits Decreased range of motion;Dizziness;Decreased activity tolerance;Impaired vision/preception;Postural dysfunction   Rehab Potential Good   PT Frequency 2x / week   PT Duration 4 weeks   PT Treatment/Interventions ADLs/Self Care Home Management;Therapeutic activities;Therapeutic exercise;Balance training;Neuromuscular re-education;Patient/family education;Manual techniques;Vestibular;Visual/perceptual  remediation/compensation   PT Next Visit Plan Continue with manual therapy, cervical ROM, visual  tracking; increase focus on balance activities next session   PT Home Exercise Plan updated    Consulted and Agree with Plan of Care Patient        Problem List Patient Active Problem List   Diagnosis Date Noted  . Essential hypertension 12/14/2014  . Type 2 diabetes mellitus with other  circulatory complications 41/28/2081  . HLD (hyperlipidemia) 12/14/2014  . Neck pain 12/14/2014  . Cerebral infarction, chronic   . Dizzy   . Vertigo 10/14/2014  . 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 Hypertension 10/14/2014  . CVA (cerebral infarction) 10/14/2014  . Impingement syndrome of left shoulder 08/02/2014  . 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 Abdominal pain 06/13/2014  . Rectal bleeding 03/13/2014  . Diabetes type 2, controlled 02/18/2014  . Hyperlipidemia 02/18/2014  . Diabetic peripheral neuropathy 03/28/2013  . Irreducible incisional hernia 11/11/2010    Deniece Ree PT, DPT [place] [address] [place], [place], [address] Phone: [phone]   Fax:  [phone]

## 2014-12-27 ENCOUNTER — Telehealth: Payer: Self-pay | Admitting: *Deleted

## 2014-12-27 ENCOUNTER — Telehealth: Payer: Self-pay | Admitting: Family Medicine

## 2014-12-27 ENCOUNTER — Ambulatory Visit (HOSPITAL_COMMUNITY)
Admission: RE | Admit: 2014-12-27 | Discharge: 2014-12-27 | Disposition: A | Discharge status: Home / Self Care | Payer: 59 | Source: Ambulatory Visit | Attending: Neurology | Admitting: Neurology

## 2014-12-27 DIAGNOSIS — M5032 Other cervical disc degeneration, mid-cervical region: Secondary | ICD-10-CM | POA: Diagnosis not present

## 2014-12-27 DIAGNOSIS — R42 Dizziness and giddiness: Secondary | ICD-10-CM | POA: Insufficient documentation

## 2014-12-27 DIAGNOSIS — M542 Cervicalgia: Secondary | ICD-10-CM | POA: Insufficient documentation

## 2014-12-27 DIAGNOSIS — M5022 Other cervical disc displacement, mid-cervical region: Secondary | ICD-10-CM | POA: Diagnosis not present

## 2014-12-27 MED ORDER — TOPIRAMATE 25 MG PO TABS: 50.0000 mg | ORAL_TABLET | Freq: Two times a day (BID) | ORAL | Status: DC

## 2014-12-27 MED ORDER — PREGABALIN 100 MG PO CAPS: 75.0000 mg | ORAL_CAPSULE | Freq: Three times a day (TID) | ORAL | Status: DC

## 2014-12-27 NOTE — Telephone Encounter (Signed)
Patient called stating he is in need of a refill on Lyrica 100 MG  today. Patient is completely out.

## 2014-12-27 NOTE — Addendum Note (Signed)
Addended by: Ofilia Neas R on: 12/27/2014 11:39 AM   Modules accepted: Orders

## 2014-12-27 NOTE — Telephone Encounter (Signed)
error 

## 2014-12-27 NOTE — Telephone Encounter (Signed)
Last OV note says: increase topamax to 50mg  twice a day for headache prevention.  Rx for Topamax has been sent.   It appears Dr Wolfgang Phoenix is the physician prescribing Lyrica.  I called back.  Got no answer.  Left message.

## 2014-12-27 NOTE — Telephone Encounter (Signed)
May refill times 6

## 2014-12-31 ENCOUNTER — Telehealth (HOSPITAL_COMMUNITY): Payer: Self-pay

## 2014-12-31 ENCOUNTER — Ambulatory Visit (HOSPITAL_COMMUNITY): Payer: 59

## 2014-12-31 ENCOUNTER — Encounter (HOSPITAL_COMMUNITY): Payer: 59 | Admitting: Specialist

## 2014-12-31 NOTE — Therapy (Signed)
[place], [place], [address] Phone: [phone]   Fax:  [phone]  Patient Details  Name: Travis [name] MRN: [id number] Date of Birth: May 08, 1960 Referring Provider:  Kathie Dike, MD  Encounter Date: 12/31/2014  Pt arrived for session 25 minutes late stating he had root canal earlier today.  Pt stated slurred speech and stated he was numb on face upon palpation from medication.  No session held today.  [address], LPTA; [place]  Aldona Lento 12/31/2014, 5:14 PM  [facility] [address] [place], [place], [address] Phone: [phone]   Fax:  [phone]

## 2014-12-31 NOTE — Telephone Encounter (Signed)
No show, called and left message indicating missed apt, included next apt date and time and contact info.  [address], [place]; [facility] [phone]

## 2015-01-02 ENCOUNTER — Ambulatory Visit (HOSPITAL_COMMUNITY)
Admission: RE | Admit: 2015-01-02 | Discharge: 2015-01-02 | Disposition: A | Discharge status: Home / Self Care | Payer: 59 | Source: Ambulatory Visit | Attending: Neurology | Admitting: Neurology

## 2015-01-02 ENCOUNTER — Ambulatory Visit (HOSPITAL_COMMUNITY): Payer: 59 | Attending: Orthopedic Surgery | Admitting: Physical Therapy

## 2015-01-02 ENCOUNTER — Encounter (HOSPITAL_COMMUNITY): Payer: 59

## 2015-01-02 DIAGNOSIS — R2681 Unsteadiness on feet: Secondary | ICD-10-CM | POA: Diagnosis present

## 2015-01-02 DIAGNOSIS — M436 Torticollis: Secondary | ICD-10-CM | POA: Diagnosis present

## 2015-01-02 DIAGNOSIS — R42 Dizziness and giddiness: Secondary | ICD-10-CM | POA: Insufficient documentation

## 2015-01-02 LAB — POCT I-STAT CREATININE: CREATININE: 1.2 mg/dL (ref 0.61–1.24)

## 2015-01-02 MED ORDER — IOHEXOL 350 MG/ML SOLN
75.0000 mL | Freq: Once | INTRAVENOUS | Status: AC | PRN
  Administered 2015-01-02: 75 mL via INTRAVENOUS

## 2015-01-02 NOTE — Therapy (Signed)
[facility] [address] [place], [place], [address] Phone: [phone]   Fax:  [phone]  Physical Therapy Treatment (Re-Assessment)  Patient Details  Name: Travis [name] MRN: [id number] Date of Birth: 1960-08-24 Referring Provider:  Kathie Dike, MD  Encounter Date: 01/02/2015      PT End of Session - 01/02/15 1730    Visit Number 11   Number of Visits 15   Date for PT Re-Evaluation 01/23/15   Authorization Type UMR   Authorization Time Period 11/12/14 to 41/28/78; recert done for 6/76 to 11/13   PT Start Time 1645   PT Stop Time 1716   PT Time Calculation (min) 31 min   Activity Tolerance Patient tolerated treatment well   Behavior During Therapy [facility] for tasks assessed/performed      Past Medical History  Diagnosis Date  . Night sweats     every once in a while  . Hyperlipidemia   . Diabetes mellitus   . Hernia, inguinal   . Dizziness   . Chronic headaches   . Arthritis   . Neuropathy of lower extremity   . Diabetic peripheral neuropathy 03/28/2013  . Diverticulosis   . Hypertension   . Complication of anesthesia     bleed after last shoulder-had to stay overnight  . Impingement syndrome of left shoulder 08/02/2014  . 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 Multi-infarct state 10/14/2014    Past Surgical History  Procedure Laterality Date  . Inguinal hernia repair Bilateral   . Cervical disc surgery    . Wrist surgery Right     fusion  . Shoulder arthroscopy Right     1  . Knee arthroscopy Right   . Colon surgery  2003    1/3 removed for diverticulitis  . Colonoscopy    . Umbilical hernia repair      with other hernia repair with mesh  . Shoulder arthroscopy Left 08/02/2014    Procedure: LEFT SHOULDER SCOPE DEBRIDEMENT/ACROMIOPLASTY;  Surgeon: [name], MD;  Location: [facility];  Service: Orthopedics;  Laterality: Left;  ANESTHESIA: GENERAL, PRE/POST OP SCALENE    There  were no vitals filed for this visit.  Visit Diagnosis:  Vertigo - Plan: PT plan of care cert/re-cert  Unsteadiness - Plan: PT plan of care cert/re-cert  Stiffness of cervical spine - Plan: PT plan of care cert/re-cert      Subjective Assessment - 01/02/15 1647    Subjective Patient reports that he is feeling numb, tingly, dizzy today. He has a procedure today at 5:30 at the hospital and needs to leave early today. Reports that he feels like he is plateauing in progress, has not noticed any major changes recently.  Reports that he was the one driving tuesday after his root canal but that his wife was following behind him and did blink her lights to keep him attentive while he was driving.  Does not appear to be cognizant or aware of safety issues associated with driving after receiving medications associated with rooot canal procedure. Continues to complain of visual changes, states that he thought that the tires at work were flat when they were in fact inflated. Has trouble getting his arms going to wash his hair in the shower; reports he is having trouble putting right words together.    Pertinent History Vertigo started about 2 years ago; just came on out of nowhere. Right now dizziness is 10/10. Seems like every time it sets off,  something in neck seems to pop. Addressed shoulder first, now coming for vertigo. Had neck surgery about 5-6 years ago .   Currently in Pain? Yes   Pain Score 4   dizziness scale 5/10 today; shoulder pain 4/10             [facility] PT Assessment - 01/02/15 0001    Assessment   Medical Diagnosis vertigo    Onset Date/Surgical Date --  2 years ago    Next MD Visit 22nd of September    Balance Screen   Has the patient fallen in the past 6 months No   Has the patient had a decrease in activity level because of a fear of falling?  Yes   Is the patient reluctant to leave their home because of a fear of falling?  Yes   Prior Function   Level of Independence  Independent;Independent with basic ADLs;Independent with gait;Independent with transfers   Vocation Full time employment   [employer]   Cervical Flexion 58   Cervical Extension 29   Cervical - Right Side Bend 63   Cervical - Left Side Bend 52   Cervical - Right Rotation 63   Cervical - Left Rotation 65                      Vestibular Treatment/Exercise - 01/02/15 0001    Eye/Head Exercise Vertical   Comment visual tracking on horizontal, vertical, diagonal, and accomodation; patient continues to have significant visual changes and dizziness especially with visual tracking on downwards planes. Accomodation also remains very difficult.                PT Education - 01/02/15 1730    Education provided Yes   Education Details progress with skilled PT services, plan of care moving forward    Person(s) Educated Patient   Methods Explanation   Comprehension Verbalized understanding          PT Short Term Goals - 01/02/15 1709    PT SHORT TERM GOAL #1   Title Patient will experience no more than 5/10 dizziness at all times    Baseline 9/1- patient reports if he bends over to get something his dizziness gets worse    Time 4   Period Weeks   Status On-going   PT SHORT TERM GOAL #2   Title Patient will demonstrate cervical ROM to be improved by at least 10 degrees all planes    Time 4   Period Weeks   Status On-going   PT SHORT TERM GOAL #3   Title Patient to demonstrate the ability to correctly and consistently perform HEP, to be updated PRN    Time 4   Period Weeks   Status Achieved           PT Long Term Goals - 01/02/15 1712    PT LONG TERM GOAL #1   Title Patient to experience no more than 2/10 dizziness at worst    Time 8   Period Weeks   Status On-going   PT LONG TERM GOAL #2   Title Patient to report that he has been able to return to functional sports activities such as baseball with no increase in dizziness    Period Weeks   Status On-going    PT LONG TERM GOAL #3   Title Cervical ROM to be within normal limits to reduce dizziness and reduce chances of overall recurrence of condition   Time 8  Period Weeks   Status On-going   PT LONG TERM GOAL #4   Title Patient to demonstrate improved balance and proprioception as evidenced by an ability to maintain SLS on foam pad with eyes closed for at least 20 seconds each leg    Time 8   Period Weeks   Status On-going               Plan - 01/02/15 1732    Clinical Impression Statement Re-assessment performed today. Patient has not made signficant improvements in cervical ROM, dizziness/vertigo, or balance. He continues to remain very anxious and gets himself worked up quite a bit regarding his job, especially his relations with his boss, as well as his symptoms including visual changes, difficulty with word finding, balance impairment, and  overall difficulties at work. While patient  appears to be displaying very slow progress in terms of cervical ROM and vertigo, he may benefit from  3-4 sessions with sepcific focus primarily on balance and balance recovery strategies before DC to appropriate HEP.    Pt will benefit from skilled therapeutic intervention in order to improve on the following deficits Decreased range of motion;Dizziness;Decreased activity tolerance;Impaired vision/preception;Postural dysfunction   Rehab Potential Good   PT Frequency Other (comment)  4 more sessions    PT Duration Other (comment)  4 more sessions    PT Treatment/Interventions ADLs/Self Care Home Management;Therapeutic activities;Therapeutic exercise;Balance training;Neuromuscular re-education;Patient/family education;Manual techniques;Vestibular;Visual/perceptual remediation/compensation   PT Next Visit Plan Focus primarily on balance, develop appropriate HEP for use after DC    PT Home Exercise Plan updated    Consulted and Agree with Plan of Care Patient        Problem List Patient Active  Problem List   Diagnosis Date Noted  . Essential hypertension 12/14/2014  . Type 2 diabetes mellitus with other circulatory complications 31/49/7026  . HLD (hyperlipidemia) 12/14/2014  . Neck pain 12/14/2014  . Cerebral infarction, chronic   . Dizzy   . Vertigo 10/14/2014  . 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 Hypertension 10/14/2014  . CVA (cerebral infarction) 10/14/2014  . Impingement syndrome of left shoulder 08/02/2014  . 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 Abdominal pain 06/13/2014  . Rectal bleeding 03/13/2014  . Diabetes type 2, controlled 02/18/2014  . Hyperlipidemia 02/18/2014  . Diabetic peripheral neuropathy 03/28/2013  . Irreducible incisional hernia 11/11/2010   Physical Therapy Progress Note  Dates of Reporting Period: 12/17/14 to 01/02/15  Objective Reports of Subjective Statement: see above  Objective Measurements: see above   Goal Update: see above   Plan: see above   Reason Skilled Services are Required: balance and balance recovery, develop appropriate HEP before DC in 3-4 sessions     Deniece Ree PT, DPT [place], [place], [address] Phone: [phone]   Fax:  [phone]

## 2015-01-03 ENCOUNTER — Other Ambulatory Visit (HOSPITAL_COMMUNITY): Payer: 59

## 2015-01-09 ENCOUNTER — Telehealth: Payer: Self-pay

## 2015-01-09 ENCOUNTER — Telehealth (HOSPITAL_COMMUNITY): Payer: Self-pay

## 2015-01-09 ENCOUNTER — Ambulatory Visit (HOSPITAL_COMMUNITY): Payer: 59

## 2015-01-09 NOTE — Telephone Encounter (Signed)
I spoke to patient and he is aware of results. He will continue rehab. Patient asked about his MRI results? And asks why he is still dizzy and any further recommendations for him?

## 2015-01-09 NOTE — Telephone Encounter (Signed)
Patient was in Dr Brennan Bailey office at 5:48 pm waiting to see the MD. He forgot to call us and cx. NF

## 2015-01-09 NOTE — Telephone Encounter (Signed)
-----   Message from Desmond Lope, RN sent at 01/08/2015  5:39 PM EDT -----   ----- Message -----    From: Rosalin Hawking, MD    Sent: 01/08/2015   5:26 PM      To: [facility] Triage Pool  Hello, please let the pt know that his CTA of the neck was unremarkable and no vessel narrowing or occlusion. Continue current treatment plan. Thanks.  Rosalin Hawking, MD PhD Stroke Neurology 01/08/2015 5:26 PM

## 2015-01-13 ENCOUNTER — Telehealth: Payer: Self-pay | Admitting: Family Medicine

## 2015-01-13 NOTE — Telephone Encounter (Signed)
Pt calling to let you know he is going to see a dentist today, he is having  Severe oral pain, trying to get the pain relieved at this point. He says you told  Him to tell you of everything there is going on with him being diabetic.   Call [name], if you need anything, other wise he will contact you once it's done

## 2015-01-16 ENCOUNTER — Ambulatory Visit (HOSPITAL_COMMUNITY): Payer: 59

## 2015-01-16 ENCOUNTER — Ambulatory Visit: Payer: 59 | Admitting: Family Medicine

## 2015-01-16 NOTE — Telephone Encounter (Signed)
No show, called pt. with no answer and mail box was full so unable to leave message.  [address] [place]; [facility] [phone]'

## 2015-01-21 ENCOUNTER — Ambulatory Visit (HOSPITAL_COMMUNITY): Payer: 59 | Admitting: Physical Therapy

## 2015-01-22 ENCOUNTER — Ambulatory Visit: Payer: 59 | Admitting: Family Medicine

## 2015-01-23 ENCOUNTER — Ambulatory Visit (INDEPENDENT_AMBULATORY_CARE_PROVIDER_SITE_OTHER): Payer: 59 | Admitting: Neurology

## 2015-01-23 ENCOUNTER — Encounter: Payer: Self-pay | Admitting: Family Medicine

## 2015-01-23 ENCOUNTER — Ambulatory Visit (INDEPENDENT_AMBULATORY_CARE_PROVIDER_SITE_OTHER): Payer: 59 | Admitting: Family Medicine

## 2015-01-23 ENCOUNTER — Encounter: Payer: Self-pay | Admitting: Neurology

## 2015-01-23 VITALS — BP 130/80 | Ht 73.0 in | Wt 185.0 lb

## 2015-01-23 VITALS — BP 130/79 | HR 70 | Ht 73.0 in | Wt 184.6 lb

## 2015-01-23 DIAGNOSIS — I6930 Unspecified sequelae of cerebral infarction: Secondary | ICD-10-CM

## 2015-01-23 DIAGNOSIS — E785 Hyperlipidemia, unspecified: Secondary | ICD-10-CM

## 2015-01-23 DIAGNOSIS — E119 Type 2 diabetes mellitus without complications: Secondary | ICD-10-CM | POA: Diagnosis not present

## 2015-01-23 DIAGNOSIS — M542 Cervicalgia: Secondary | ICD-10-CM | POA: Diagnosis not present

## 2015-01-23 DIAGNOSIS — R42 Dizziness and giddiness: Secondary | ICD-10-CM | POA: Diagnosis not present

## 2015-01-23 DIAGNOSIS — G629 Polyneuropathy, unspecified: Secondary | ICD-10-CM | POA: Diagnosis not present

## 2015-01-23 DIAGNOSIS — R0683 Snoring: Secondary | ICD-10-CM | POA: Diagnosis not present

## 2015-01-23 DIAGNOSIS — E1342 Other specified diabetes mellitus with diabetic polyneuropathy: Secondary | ICD-10-CM | POA: Diagnosis not present

## 2015-01-23 DIAGNOSIS — I10 Essential (primary) hypertension: Secondary | ICD-10-CM | POA: Diagnosis not present

## 2015-01-23 DIAGNOSIS — M5030 Other cervical disc degeneration, unspecified cervical region: Secondary | ICD-10-CM | POA: Insufficient documentation

## 2015-01-23 DIAGNOSIS — Z23 Encounter for immunization: Secondary | ICD-10-CM | POA: Diagnosis not present

## 2015-01-23 DIAGNOSIS — E1159 Type 2 diabetes mellitus with other circulatory complications: Secondary | ICD-10-CM

## 2015-01-23 DIAGNOSIS — I639 Cerebral infarction, unspecified: Secondary | ICD-10-CM | POA: Diagnosis not present

## 2015-01-23 DIAGNOSIS — E1142 Type 2 diabetes mellitus with diabetic polyneuropathy: Secondary | ICD-10-CM

## 2015-01-23 LAB — POCT GLYCOSYLATED HEMOGLOBIN (HGB A1C): Hemoglobin A1C: 5.6
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
## 2015-01-23 NOTE — Progress Notes (Signed)
Subjective:    Patient ID: Travis [name], male    DOB: Jan 04, 1961, 54 y.o.   MRN: [id number]  Diabetes He presents for his follow-up diabetic visit. He has type 2 diabetes mellitus. There are no hypoglycemic associated symptoms. Pertinent negatives for hypoglycemia include no confusion. There are no diabetic associated symptoms. Pertinent negatives for diabetes include no chest pain, no fatigue, no polydipsia, no polyphagia and no weakness. There are no hypoglycemic complications. There are no diabetic complications. There are no known risk factors for coronary artery disease. Current diabetic treatment includes oral agent (dual therapy). He is compliant with treatment all of the time.   Diabetic eye exam- 03/29/14  Patient states that his symptoms are not getting better. They are getting worse. The tingling in his extremities are worsening and he has blurred vision.  Patient relates severe neuropathy in his legs burning pain discomfort difficulty feeling his legs has difficult time sensing when he is walking denies any injury. States sugars been under good control watching diet closely has had some low sugar spells Patient with some difficulty thinking at times some occasional confusion he attributes this to previous strokes Patient states he has difficult time getting through work. Because of vertigo issues in balance issues burning and pain in his legs as well as numbness in both hands. Patient relates compliance with medication and diet  Review of Systems  Constitutional: Negative for activity change, appetite change and fatigue.  HENT: Negative for congestion.   Respiratory: Negative for cough.   Cardiovascular: Negative for chest pain.  Gastrointestinal: Negative for abdominal pain.  Endocrine: Negative for polydipsia and polyphagia.  Neurological: Negative for weakness.  Psychiatric/Behavioral: Negative for confusion.       Objective:   Physical Exam  Constitutional: He  appears well-nourished. No distress.  Cardiovascular: Normal rate, regular rhythm and normal heart sounds.   No murmur heard. Pulmonary/Chest: Effort normal and breath sounds normal. No respiratory distress.  Musculoskeletal: He exhibits no edema.  Lymphadenopathy:    He has no cervical adenopathy.  Neurological: He is alert.  Psychiatric: His behavior is normal.  Vitals reviewed.  greater than 25 minutes spent with this patient will going around multiple different issues and discussing in listening in answering questions   NCV in hands     Assessment & Plan:  1. Type 2 diabetes mellitus without complication Diabetes good control continue current measures reduce URI to a half tablet in the morning one at supper report to [place] if ongoing low sugar spells - POCT glycosylated hemoglobin (Hb A1C)  2. Encounter for immunization Flu vaccine today  3. Diabetic peripheral neuropathy Is at maximum medication for neuropathy continue current measures. Possibly could increase Topamax in the future if ongoing troubles  4. Multi-infarct state Strokes I don't find any evidence he is having use strokes but I do feel this is affecting his mental cognitive skills. I think it's making it more difficult for him to work. Eventually he may become disabled.  5. Essential hypertension Blood pressure good control continue current measures  6. Hyperlipidemia Cholesterol good control continue current measures  7. Vertigo Intermittent vertigo if ongoing trouble notify us  Patient is doing the best he can to work on a regular basis but at some point time if they get too much for him and he may end up having to come out because of his severe neuropathy.  Patient also having neuropathy in his hands I wonder if there could be a developmental of carpal  tunnel. I would recommend that the patient consider talking with his neurologist about whether or not carpal tunnel testing would be of benefit.  Patient  follow-up within 4 months

## 2015-01-23 NOTE — Patient Instructions (Addendum)
-   continue ASA 81mg  and pravastatin for stroke prevention - you have degenerative disease at neck and back, on top of diabetic neuropathy, which explains most of your symptoms.  - increase topamax to 50mg  twice a day for headache prevention - continue PT for neck pain and balance - Follow up with your primary care physician for stroke risk factor modification. Recommend maintain blood pressure goal <130/80, diabetes with hemoglobin A1c goal below 6.5% and lipids with LDL cholesterol goal below 70 mg/dL.  - check BP and glucose at home - sleep study to rule out sleep apnea - decrease stress level and anxiety level - follow up in 3 months

## 2015-01-24 ENCOUNTER — Telehealth: Payer: Self-pay | Admitting: Family Medicine

## 2015-01-24 ENCOUNTER — Telehealth: Payer: Self-pay | Admitting: Neurology

## 2015-01-24 DIAGNOSIS — E1142 Type 2 diabetes mellitus with diabetic polyneuropathy: Secondary | ICD-10-CM

## 2015-01-24 NOTE — Telephone Encounter (Signed)
pts spouse calling to see if since Travis [name] is a Diabetic can we call him  In a machine?   Please try sending to Outpatient Pharm first If not send to [facility]

## 2015-01-24 NOTE — Telephone Encounter (Signed)
Rn call patients wife back about her husbands PCP thinks patient needs a nerve condution test.EMG. Rn stated a message will be sent to Dr. Erlinda Hong. Lynelle Smoke stated that the pts PCP was going to notified Dr. Erlinda Hong of his concerns for pt numbness in hands at times.

## 2015-01-24 NOTE — Telephone Encounter (Signed)
Pt wife called and said pt was seen by Dr Erlinda Hong yesterday and was told he may need a NCV/EMG and was calling back to schedule please call pt at [phone] dg

## 2015-01-24 NOTE — Telephone Encounter (Signed)
Called patient's wife and informed her per Dr.Scott Luking that order for Accu check machine is being sent into pharmacy. Patient's wife verbalized understanding.

## 2015-01-25 NOTE — Telephone Encounter (Addendum)
I looked at Dr. Lance Sell note and he did not mention of EMG/NCS study. But if Dr. Wolfgang Phoenix wants to have it, I am OK with it. I'm thinking that he had cervical radiculopathy as well as diabetic neuropathy, so the result will be mixed and sometimes hard to separate out the two processes. But certainly we can do it. Please let the patient know that I have made the order and he should be contacted shortly for the test.  Rosalin Hawking, MD PhD Stroke Neurology 01/25/2015 1:20 PM  Orders Placed This Encounter  Procedures  . NCV with EMG(electromyography)    Standing Status: Future     Number of Occurrences:      Standing Expiration Date: 01/25/2016    Order Specific Question:  Where should this test be performed?    Answer:  [facility]

## 2015-01-25 NOTE — Addendum Note (Signed)
Addended by: Rosalin Hawking on: 01/25/2015 01:23 PM   Modules accepted: Orders

## 2015-01-27 ENCOUNTER — Telehealth: Payer: Self-pay | Admitting: Neurology

## 2015-01-27 NOTE — Telephone Encounter (Signed)
Called wanting to schedule a sleep study and I don't see an order.  Please let the patient know if you are going to order one.

## 2015-01-27 NOTE — Telephone Encounter (Signed)
Wife Travis [name] called regarding setting up appt for NCV/EMG. See notes regarding Sleep Study to be set up but no NCV/EMG. Wife spoke to [facility] who saw order for NCV/EMG but no sleep study. Please advise.

## 2015-01-27 NOTE — Telephone Encounter (Signed)
If wife calls back tell her the below message thanks there are orders for both test, someone will call her  Ltf vm for pts wife Travis [name]. Rn explain on voice mail there are orders for the sleep study and EMG in the epic system.

## 2015-01-28 ENCOUNTER — Ambulatory Visit (HOSPITAL_COMMUNITY): Payer: 59 | Admitting: Physical Therapy

## 2015-01-28 DIAGNOSIS — R2681 Unsteadiness on feet: Secondary | ICD-10-CM

## 2015-01-28 DIAGNOSIS — M436 Torticollis: Secondary | ICD-10-CM

## 2015-01-28 DIAGNOSIS — R42 Dizziness and giddiness: Secondary | ICD-10-CM

## 2015-01-28 NOTE — Therapy (Signed)
[facility] [address] [place], [place], [address] Phone: [phone]   Fax:  [phone]  Physical Therapy Treatment (Re-Assessment)  Patient Details  Name: Travis [name] MRN: [id number] Date of Birth: 1961-01-21 Referring Provider:  Kathyrn Drown, MD  Encounter Date: 01/28/2015      PT End of Session - 01/28/15 1736    Visit Number 12   Number of Visits 18   Date for PT Re-Evaluation 02/25/15   Authorization Type UMR   Authorization Time Period 11/12/14 to 23/53/61; recert done for 4/43 to 11/13   PT Start Time 1651   PT Stop Time 1729   PT Time Calculation (min) 38 min   Activity Tolerance Patient tolerated treatment well   Behavior During Therapy [facility] for tasks assessed/performed      Past Medical History  Diagnosis Date  . Night sweats     every once in a while  . Hyperlipidemia   . Diabetes mellitus   . Hernia, inguinal   . Dizziness   . Chronic headaches   . Arthritis   . Neuropathy of lower extremity   . Diabetic peripheral neuropathy 03/28/2013  . Diverticulosis   . Hypertension   . Complication of anesthesia     bleed after last shoulder-had to stay overnight  . Impingement syndrome of left shoulder 08/02/2014  . 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 Multi-infarct state 10/14/2014    Past Surgical History  Procedure Laterality Date  . Inguinal hernia repair Bilateral   . Cervical disc surgery    . Wrist surgery Right     fusion  . Shoulder arthroscopy Right     1  . Knee arthroscopy Right   . Colon surgery  2003    1/3 removed for diverticulitis  . Colonoscopy    . Umbilical hernia repair      with other hernia repair with mesh  . Shoulder arthroscopy Left 08/02/2014    Procedure: LEFT SHOULDER SCOPE DEBRIDEMENT/ACROMIOPLASTY;  Surgeon: [name], MD;  Location: [place];  Service: Orthopedics;  Laterality: Left;  ANESTHESIA: GENERAL, PRE/POST OP SCALENE    There  were no vitals filed for this visit.  Visit Diagnosis:  Vertigo  Unsteadiness  Stiffness of cervical spine      Subjective Assessment - 01/28/15 1654    Subjective Patient reports that he has been continuing to having dizzy spells, ongoing issues that he has had to see multiple doctors for. Reports that he has been going downhilll as he sees it. Has undergone medication changes as well to try to affect dizziness. Dizziness is worse; has had sweaty, dizzy spells coming over him the past few days in a row. Last one was an hour ago. Reports that there has been times that he has had to just sit before he can safely drive. Vision has not gotten better; does have an eye doctor set up but waiting on insurnace to clear for it at this time. Patient reports taht he wants to keep trying to get better.    Pertinent History Vertigo started about 2 years ago; just came on out of nowhere. Right now dizziness is 10/10. Seems like every time it sets off, something in neck seems to pop. Addressed shoulder first, now coming for vertigo. Had neck surgery about 5-6 years ago .   Currently in Pain? Yes   Pain Score 7   headache 7/10; now reporting pain in back and hips up to  8/10;             [facility] PT Assessment - 01/28/15 0001    Observation/Other Assessments   Observations unsteaidness noted during gait    AROM   Cervical Flexion 54   Cervical Extension 29   Cervical - Right Side Bend 59   Cervical - Left Side Bend 52   Cervical - Right Rotation 69   Cervical - Left Rotation 65                      Vestibular Treatment/Exercise - 01/28/15 0001    Eye/Head Exercise Vertical   Comment significant dizziness and visual changes with diagonal visual tracking, saccades, accomodtion, saccades; cranial nerves appear intact                PT Education - 01/28/15 1736    Education provided Yes   Education Details plan of care moving forward, speech referral    Person(s) Educated  Patient   Methods Explanation   Comprehension Verbalized understanding          PT Short Term Goals - 01/02/15 1709    PT SHORT TERM GOAL #1   Title Patient will experience no more than 5/10 dizziness at all times    Baseline 9/1- patient reports if he bends over to get something his dizziness gets worse    Time 4   Period Weeks   Status On-going   PT SHORT TERM GOAL #2   Title Patient will demonstrate cervical ROM to be improved by at least 10 degrees all planes    Time 4   Period Weeks   Status On-going   PT SHORT TERM GOAL #3   Title Patient to demonstrate the ability to correctly and consistently perform HEP, to be updated PRN    Time 4   Period Weeks   Status Achieved           PT Long Term Goals - 01/02/15 1712    PT LONG TERM GOAL #1   Title Patient to experience no more than 2/10 dizziness at worst    Time 8   Period Weeks   Status On-going   PT LONG TERM GOAL #2   Title Patient to report that he has been able to return to functional sports activities such as baseball with no increase in dizziness    Period Weeks   Status On-going   PT LONG TERM GOAL #3   Title Cervical ROM to be within normal limits to reduce dizziness and reduce chances of overall recurrence of condition   Time 8   Period Weeks   Status On-going   PT LONG TERM GOAL #4   Title Patient to demonstrate improved balance and proprioception as evidenced by an ability to maintain SLS on foam pad with eyes closed for at least 20 seconds each leg    Time 8   Period Weeks   Status On-going               Plan - 01/28/15 1737    Clinical Impression Statement Re-assessment performed today. Patient has not been able to attend skilled PT services for a variety of reasons since last re-evaluation, however did return today with willingness to continue with skilled services. Reports that he has not been doing well recently, also voiced some concerns over possibly not being able to work anymore at  some poiint. No significant changes and some worsening of symptoms noted. Cranial  nerves  appear intact. At this piont will extend patient 6 more sessions with specific focus on balance and update of neck  HEP.    Pt will benefit from skilled therapeutic intervention in order to improve on the following deficits Decreased range of motion;Dizziness;Decreased activity tolerance;Impaired vision/preception;Postural dysfunction   Rehab Potential Good   PT Frequency Other (comment)  6 more sessions    PT Duration Other (comment)  6 more sessions    PT Treatment/Interventions ADLs/Self Care Home Management;Therapeutic activities;Therapeutic exercise;Balance training;Neuromuscular re-education;Patient/family education;Manual techniques;Vestibular;Visual/perceptual remediation/compensation   PT Next Visit Plan Focus primarily on balance, develop appropriate HEP for use after DC    PT Home Exercise Plan updated    Consulted and Agree with Plan of Care Patient        Problem List Patient Active Problem List   Diagnosis Date Noted  . Snoring 01/23/2015  . Degeneration of cervical intervertebral disc 01/23/2015  . Essential hypertension 12/14/2014  . Type 2 diabetes mellitus with other circulatory complications 89/16/9450  . HLD (hyperlipidemia) 12/14/2014  . Neck pain 12/14/2014  . Cerebral infarction, chronic   . Dizzy   . Vertigo 10/14/2014  . 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 Hypertension 10/14/2014  . CVA (cerebral infarction) 10/14/2014  . Impingement syndrome of left shoulder 08/02/2014  . 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 Abdominal pain 06/13/2014  . Rectal bleeding 03/13/2014  . Diabetes type 2, controlled 02/18/2014  . Hyperlipidemia 02/18/2014  . Diabetic peripheral neuropathy 03/28/2013  . Irreducible incisional hernia 11/11/2010   Physical Therapy Progress Note  Dates of Reporting Period: 01/02/15 to 01/28/15  Objective Reports of Subjective Statement: see  above   Objective Measurements: see above   Goal Update: see above   Plan: see above; requested speech referral from MD   Reason Skilled Services are Required: balance and balance reaction strategies, reduction of neck pain and creation of neck HEP    Deniece Ree PT, DPT [place], [place], [address] Phone: [phone]   Fax:  [phone]

## 2015-01-29 NOTE — Telephone Encounter (Signed)
I scheduled NCV/EMG but wife wants clarification if bil legs or bil arms or 1 arm/1 leg will be done. Please call and advise.

## 2015-01-29 NOTE — Telephone Encounter (Signed)
LFt vm for patients wife that order is for peripheral neuropathy for EMG. Pt already has appts schedule.

## 2015-01-29 NOTE — Telephone Encounter (Signed)
Rn call patients wife Lynelle Smoke and spoke to her about her husband appts. [name] stated that he has a appt for the sleep consult. Also Rn explain there is an order for the EMG listed under procedures. [name] stated she was on the other line and will schedule the EMG for her husband.

## 2015-01-30 ENCOUNTER — Other Ambulatory Visit: Payer: Self-pay | Admitting: Neurology

## 2015-01-30 DIAGNOSIS — I6930 Unspecified sequelae of cerebral infarction: Secondary | ICD-10-CM

## 2015-02-01 DIAGNOSIS — I639 Cerebral infarction, unspecified: Secondary | ICD-10-CM

## 2015-02-01 HISTORY — DX: Cerebral infarction, unspecified: I63.9

## 2015-02-04 ENCOUNTER — Ambulatory Visit (HOSPITAL_COMMUNITY): Payer: 59 | Attending: Orthopedic Surgery | Admitting: Physical Therapy

## 2015-02-04 DIAGNOSIS — R2681 Unsteadiness on feet: Secondary | ICD-10-CM | POA: Diagnosis present

## 2015-02-04 DIAGNOSIS — R42 Dizziness and giddiness: Secondary | ICD-10-CM | POA: Diagnosis present

## 2015-02-04 DIAGNOSIS — R41841 Cognitive communication deficit: Secondary | ICD-10-CM | POA: Insufficient documentation

## 2015-02-04 DIAGNOSIS — M436 Torticollis: Secondary | ICD-10-CM | POA: Diagnosis present

## 2015-02-04 NOTE — Therapy (Signed)
[facility] [address] [place], [place], [address] Phone: [phone]   Fax:  [phone]  Physical Therapy Treatment  Patient Details  Name: Travis [name] MRN: [id number] Date of Birth: [birth year] Referring Provider:  Kathyrn Drown, MD  Encounter Date: 02/04/2015      PT End of Session - 02/04/15 1740    Visit Number 13   Number of Visits 18   Date for PT Re-Evaluation 02/25/15   Authorization Type UMR   Authorization Time Period 11/12/14 to 20/94/70; recert done for 9/62 to 11/13   PT Start Time 1648   PT Stop Time 1738   PT Time Calculation (min) 50 min   Activity Tolerance Patient tolerated treatment well   Behavior During Therapy [facility] for tasks assessed/performed      Past Medical History  Diagnosis Date  . Night sweats     every once in a while  . Hyperlipidemia   . Diabetes mellitus   . Hernia, inguinal   . Dizziness   . Chronic headaches   . Arthritis   . Neuropathy of lower extremity   . Diabetic peripheral neuropathy 03/28/2013  . Diverticulosis   . Hypertension   . Complication of anesthesia     bleed after last shoulder-had to stay overnight  . Impingement syndrome of left shoulder 08/02/2014  . 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 Multi-infarct state 10/14/2014    Past Surgical History  Procedure Laterality Date  . Inguinal hernia repair Bilateral   . Cervical disc surgery    . Wrist surgery Right     fusion  . Shoulder arthroscopy Right     1  . Knee arthroscopy Right   . Colon surgery  2003    1/3 removed for diverticulitis  . Colonoscopy    . Umbilical hernia repair      with other hernia repair with mesh  . Shoulder arthroscopy Left 08/02/2014    Procedure: LEFT SHOULDER SCOPE DEBRIDEMENT/ACROMIOPLASTY;  Surgeon: [name], MD;  Location: [place];  Service: Orthopedics;  Laterality: Left;  ANESTHESIA: GENERAL, PRE/POST OP SCALENE    There were no vitals  filed for this visit.  Visit Diagnosis:  Vertigo  Unsteadiness  Stiffness of cervical spine      Subjective Assessment - 02/04/15 1650    Subjective Patient reports that he is doing OK today, no major changes from last session; dizzy spells are increasing in frequency and can last anywhere from 30 minutes to 3 hours. 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    Pertinent History Vertigo started about 2 years ago; just came on out of nowhere. Right now dizziness is 10/10. Seems like every time it sets off, something in neck seems to pop. Addressed shoulder first, now coming for vertigo. Had neck surgery about 5-6 years ago .   Currently in Pain? Yes   Pain Score 6   headache pain             [facility] PT Assessment - 02/04/15 0001    Observation/Other Assessments   Observations BP 131/84 in sitting today; post-exercise 148/88                      [facility] Adult PT Treatment/Exercise - 02/04/15 0001    Neck Exercises: Seated   Neck Retraction --   Neck Retraction Limitations --   Other Seated Exercise 3D cervical and throacic  excursions 1x15             Balance Exercises - 02/04/15 1738    Balance Exercises: Standing   Standing Eyes Closed Narrow base of support (BOS);Foam/compliant surface;3 reps;20 secs   Tandem Stance Eyes open;Foam/compliant surface;3 reps;15 secs   SLS Eyes open;Solid surface;3 reps   Gait with Head Turns Forward;4 reps;Other (comment)  51ft, vertical and horizontal head turns    Tandem Gait Forward;4 reps;Other (comment)  2ftx4           PT Education - 02/04/15 1739    Education provided Yes   Education Details importance of stress relief and relation to symptoms    Person(s) Educated Patient   Methods Explanation   Comprehension Verbalized understanding          PT Short Term Goals - 01/02/15 1709    PT SHORT TERM GOAL #1   Title Patient will experience no more than 5/10 dizziness at all times     Baseline 9/1- patient reports if he bends over to get something his dizziness gets worse    Time 4   Period Weeks   Status On-going   PT SHORT TERM GOAL #2   Title Patient will demonstrate cervical ROM to be improved by at least 10 degrees all planes    Time 4   Period Weeks   Status On-going   PT SHORT TERM GOAL #3   Title Patient to demonstrate the ability to correctly and consistently perform HEP, to be updated PRN    Time 4   Period Weeks   Status Achieved           PT Long Term Goals - 01/02/15 1712    PT LONG TERM GOAL #1   Title Patient to experience no more than 2/10 dizziness at worst    Time 8   Period Weeks   Status On-going   PT LONG TERM GOAL #2   Title Patient to report that he has been able to return to functional sports activities such as baseball with no increase in dizziness    Period Weeks   Status On-going   PT LONG TERM GOAL #3   Title Cervical ROM to be within normal limits to reduce dizziness and reduce chances of overall recurrence of condition   Time 8   Period Weeks   Status On-going   PT LONG TERM GOAL #4   Title Patient to demonstrate improved balance and proprioception as evidenced by an ability to maintain SLS on foam pad with eyes closed for at least 20 seconds each leg    Time 8   Period Weeks   Status On-going               Plan - 02/04/15 1740    Clinical Impression Statement Focused on cervical/thoracic excursions today as well as balance training today; patient does demosntrate difficulty with balance exercises at this time. Patient reports that he is feeling somewhat overwhelmed by the psosibility of leaving his job due to his symptoms at end of year, and was educated that this may actually be beneficial in his recovery from his current symptoms due to the removal of stress.    Pt will benefit from skilled therapeutic intervention in order to improve on the following deficits Decreased range of motion;Dizziness;Decreased  activity tolerance;Impaired vision/preception;Postural dysfunction   Rehab Potential Good   PT Frequency Other (comment)   PT Duration Other (comment)   PT Treatment/Interventions ADLs/Self Care Home Management;Therapeutic activities;Therapeutic  exercise;Balance training;Neuromuscular re-education;Patient/family education;Manual techniques;Vestibular;Visual/perceptual remediation/compensation   PT Next Visit Plan Focus primarily on balance, develop appropriate HEP for use after DC. Take BP at beginning and end of each session, also if patient has new dizzy spell during session.     PT Home Exercise Plan updated    Consulted and Agree with Plan of Care Patient        Problem List Patient Active Problem List   Diagnosis Date Noted  . Snoring 01/23/2015  . Degeneration of cervical intervertebral disc 01/23/2015  . Essential hypertension 12/14/2014  . Type 2 diabetes mellitus with other circulatory complications ([place]) 80/07/4915  . HLD (hyperlipidemia) 12/14/2014  . Neck pain 12/14/2014  . Cerebral infarction, chronic   . Dizzy   . Vertigo 10/14/2014  . Multi-infarct state ([place]) 10/14/2014  . Hypertension 10/14/2014  . CVA (cerebral infarction) 10/14/2014  . Impingement syndrome of left shoulder 08/02/2014  . 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 Abdominal pain 06/13/2014  . Rectal bleeding 03/13/2014  . Diabetes type 2, controlled ([place]) 02/18/2014  . Hyperlipidemia 02/18/2014  . Diabetic peripheral neuropathy ([place]) 03/28/2013  . Irreducible incisional hernia 11/11/2010    Deniece Ree PT, DPT [facility] [address] [place], [place], [address] Phone: [phone]   Fax:  [phone]

## 2015-02-06 ENCOUNTER — Ambulatory Visit (HOSPITAL_COMMUNITY): Payer: 59

## 2015-02-11 ENCOUNTER — Ambulatory Visit (HOSPITAL_COMMUNITY): Payer: 59

## 2015-02-11 ENCOUNTER — Ambulatory Visit (HOSPITAL_COMMUNITY): Payer: 59 | Admitting: Physical Therapy

## 2015-02-11 DIAGNOSIS — R2681 Unsteadiness on feet: Secondary | ICD-10-CM

## 2015-02-11 DIAGNOSIS — M436 Torticollis: Secondary | ICD-10-CM

## 2015-02-11 DIAGNOSIS — R42 Dizziness and giddiness: Secondary | ICD-10-CM

## 2015-02-11 NOTE — Therapy (Signed)
[facility], [place], [address] Phone: [phone]   Fax:  [phone]  Physical Therapy Treatment  Patient Details  Name: Travis [name] MRN: [id number] Date of Birth: 19-Jan-1961 Referring Provider:  Kathyrn Drown, MD  Encounter Date: 02/11/2015      PT End of Session - 02/11/15 1742    Visit Number 14   Number of Visits 18   Date for PT Re-Evaluation 02/25/15   Authorization Type UMR   Authorization Time Period 11/12/14 to 13/24/40; recert done for 1/02 to 11/13   PT Start Time 1700   PT Stop Time 1731   PT Time Calculation (min) 31 min   Activity Tolerance Patient tolerated treatment well   Behavior During Therapy [facility] for tasks assessed/performed      Past Medical History  Diagnosis Date  . Night sweats     every once in a while  . Hyperlipidemia   . Diabetes mellitus   . Hernia, inguinal   . Dizziness   . Chronic headaches   . Arthritis   . Neuropathy of lower extremity   . Diabetic peripheral neuropathy 03/28/2013  . Diverticulosis   . Hypertension   . Complication of anesthesia     bleed after last shoulder-had to stay overnight  . Impingement syndrome of left shoulder 08/02/2014  . 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 Multi-infarct state 10/14/2014    Past Surgical History  Procedure Laterality Date  . Inguinal hernia repair Bilateral   . Cervical disc surgery    . Wrist surgery Right     fusion  . Shoulder arthroscopy Right     1  . Knee arthroscopy Right   . Colon surgery  2003    1/3 removed for diverticulitis  . Colonoscopy    . Umbilical hernia repair      with other hernia repair with mesh  . Shoulder arthroscopy Left 08/02/2014    Procedure: LEFT SHOULDER SCOPE DEBRIDEMENT/ACROMIOPLASTY;  Surgeon: [name], MD;  Location: [facility];  Service: Orthopedics;  Laterality: Left;  ANESTHESIA: GENERAL, PRE/POST OP SCALENE    There were no vitals  filed for this visit.  Visit Diagnosis:  Vertigo  Unsteadiness  Stiffness of cervical spine      Subjective Assessment - 02/11/15 1738    Subjective Patient reports that he is not feeling well today, had a dental procedure done the other day and still feeling woozy    Pertinent History Vertigo started about 2 years ago; just came on out of nowhere. Right now dizziness is 10/10. Seems like every time it sets off, something in neck seems to pop. Addressed shoulder first, now coming for vertigo. Had neck surgery about 5-6 years ago .   Currently in Pain? Yes  genearl aea of tooth    Pain Score 6                          [facility] Adult PT Treatment/Exercise - 02/11/15 0001    Neck Exercises: Standing   Other Standing Exercises Hip ABD 1x10 erach side; toe and heel raises 1x10 B HHA    Neck Exercises: Seated   Other Seated Exercise 3D cervical and throacic excursions 1x15             Balance Exercises - 02/11/15 1740    Balance Exercises: Standing   Standing Eyes Closed Narrow base of support (BOS);Foam/compliant surface;3 reps;20  secs   Tandem Stance Eyes closed;Foam/compliant surface;3 reps;20 secs   Wall Bumps Shoulder   Wall Bumps-Shoulders Eyes opened;Anterior/posterior;20 reps   Rockerboard Anterior/posterior;Lateral;Other (comment)  x20 lateral and AP, no HHA            PT Education - 02/11/15 1741    Education provided No          PT Short Term Goals - 01/02/15 1709    PT SHORT TERM GOAL #1   Title Patient will experience no more than 5/10 dizziness at all times    Baseline 9/1- patient reports if he bends over to get something his dizziness gets worse    Time 4   Period Weeks   Status On-going   PT SHORT TERM GOAL #2   Title Patient will demonstrate cervical ROM to be improved by at least 10 degrees all planes    Time 4   Period Weeks   Status On-going   PT SHORT TERM GOAL #3   Title Patient to demonstrate the ability to correctly and  consistently perform HEP, to be updated PRN    Time 4   Period Weeks   Status Achieved           PT Long Term Goals - 01/02/15 1712    PT LONG TERM GOAL #1   Title Patient to experience no more than 2/10 dizziness at worst    Time 8   Period Weeks   Status On-going   PT LONG TERM GOAL #2   Title Patient to report that he has been able to return to functional sports activities such as baseball with no increase in dizziness    Period Weeks   Status On-going   PT LONG TERM GOAL #3   Title Cervical ROM to be within normal limits to reduce dizziness and reduce chances of overall recurrence of condition   Time 8   Period Weeks   Status On-going   PT LONG TERM GOAL #4   Title Patient to demonstrate improved balance and proprioception as evidenced by an ability to maintain SLS on foam pad with eyes closed for at least 20 seconds each leg    Time 8   Period Weeks   Status On-going               Plan - 02/11/15 1742    Clinical Impression Statement Continued focus on cervical/thoracic excursions and balance today. Pateitn more unsteady than usual, possibly due to still feeling woozy from surgery yesterday as well as increased pain medicine. BP 134/84 at end of session per auto-cuff.    Pt will benefit from skilled therapeutic intervention in order to improve on the following deficits Decreased range of motion;Dizziness;Decreased activity tolerance;Impaired vision/preception;Postural dysfunction   Rehab Potential Good   PT Frequency Other (comment)   PT Duration Other (comment)   PT Treatment/Interventions ADLs/Self Care Home Management;Therapeutic activities;Therapeutic exercise;Balance training;Neuromuscular re-education;Patient/family education;Manual techniques;Vestibular;Visual/perceptual remediation/compensation   PT Next Visit Plan Focus primarily on balance, develop appropriate HEP for use after DC. Take BP at beginning and end of each session, also if patient has new  dizzy spell during session.     PT Home Exercise Plan updated    Consulted and Agree with Plan of Care Patient        Problem List Patient Active Problem List   Diagnosis Date Noted  . Snoring 01/23/2015  . Degeneration of cervical intervertebral disc 01/23/2015  . Essential hypertension 12/14/2014  . Type 2 diabetes mellitus  with other circulatory complications ([place]) 38/33/3832  . HLD (hyperlipidemia) 12/14/2014  . Neck pain 12/14/2014  . Cerebral infarction, chronic   . Dizzy   . Vertigo 10/14/2014  . Multi-infarct state ([place]) 10/14/2014  . Hypertension 10/14/2014  . CVA (cerebral infarction) 10/14/2014  . Impingement syndrome of left shoulder 08/02/2014  . 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 Abdominal pain 06/13/2014  . Rectal bleeding 03/13/2014  . Diabetes type 2, controlled ([place]) 02/18/2014  . Hyperlipidemia 02/18/2014  . Diabetic peripheral neuropathy ([place]) 03/28/2013  . Irreducible incisional hernia 11/11/2010    Deniece Ree PT, DPT [facility] [address] [place], [place], [address] Phone: [phone]   Fax:  [phone]

## 2015-02-13 ENCOUNTER — Encounter: Payer: Self-pay | Admitting: Neurology

## 2015-02-13 ENCOUNTER — Ambulatory Visit (HOSPITAL_COMMUNITY): Payer: 59

## 2015-02-13 ENCOUNTER — Ambulatory Visit (INDEPENDENT_AMBULATORY_CARE_PROVIDER_SITE_OTHER): Payer: 59 | Admitting: Neurology

## 2015-02-13 VITALS — BP 138/78 | HR 78 | Resp 18 | Ht 73.0 in | Wt 185.0 lb

## 2015-02-13 DIAGNOSIS — R519 Headache, unspecified: Secondary | ICD-10-CM

## 2015-02-13 DIAGNOSIS — R51 Headache: Secondary | ICD-10-CM | POA: Diagnosis not present

## 2015-02-13 DIAGNOSIS — R42 Dizziness and giddiness: Secondary | ICD-10-CM

## 2015-02-13 DIAGNOSIS — R0683 Snoring: Secondary | ICD-10-CM

## 2015-02-13 DIAGNOSIS — R2681 Unsteadiness on feet: Secondary | ICD-10-CM

## 2015-02-13 DIAGNOSIS — G4719 Other hypersomnia: Secondary | ICD-10-CM

## 2015-02-13 DIAGNOSIS — M436 Torticollis: Secondary | ICD-10-CM

## 2015-02-13 NOTE — Progress Notes (Signed)
Subjective:    Patient ID: Travis [name] is a 55 y.o. male.  HPI     Star Age, MD, PhD [facility] [address] [place], [place] [address]  Dear [name],   I saw your patient, Travis [name], upon your kind request in my clinic today for initial consultation of his sleep disorder, in particular, concern for underlying obstructive sleep apnea. The patient is unaccompanied today. As you know, Travis [name] is a 54 year old right-handed gentleman with an underlying medical history of hypertension, hyperlipidemia, diabetes, recurrent headaches, prior lacunar strokes, and neck pain, s/p neck surgery, who reports snoring, morning headaches, difficulty maintaing sleep, and excessive daytime somnolence for months, maybe years. His ESS is 18/24 and his fatigue score is 56/63. He has occasional nocturia, no frank RLS symptoms and denies leg twitching in his sleep. His bedtime is between 10 and 11 PM. Falling asleep is not a problem but he wakes up often in the early morning hours and has trouble going back to sleep. He wakes up with headaches almost every other day. He has chronic headaches. He was recently started on topiramate. He has no family history of obstructive sleep apnea. He has a long-standing problem with sleep maintenance. He drinks caffeine in the form of Coke 0, about 3 cans per day. He drinks occasional beer. She quit smoking 10 years ago and quit using illicit drugs years ago. He lives with his wife and 2 dogs. He has 2 grown children. He works for a [employer].  His Past Medical History Is Significant For: Past Medical History  Diagnosis Date  . Night sweats     every once in a while  . Hyperlipidemia   . Diabetes mellitus   . Hernia, inguinal   . Dizziness   . Chronic headaches   . Arthritis   . Neuropathy of lower extremity   . Diabetic peripheral neuropathy ([place]) 03/28/2013  . Diverticulosis   . Hypertension   . Complication  of anesthesia     bleed after last shoulder-had to stay overnight  . Impingement syndrome of left shoulder 08/02/2014  . 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 Multi-infarct state ([place]) 10/14/2014  . Depression   . Fibromyalgia   . Anxiety     His Past Surgical History Is Significant For: Past Surgical History  Procedure Laterality Date  . Inguinal hernia repair Bilateral   . Cervical disc surgery    . Wrist surgery Right     fusion  . Shoulder arthroscopy Right     1  . Knee arthroscopy Right   . Colon surgery  2003    1/3 removed for diverticulitis  . Colonoscopy    . Umbilical hernia repair      with other hernia repair with mesh  . Shoulder arthroscopy Left 08/02/2014    Procedure: LEFT SHOULDER SCOPE DEBRIDEMENT/ACROMIOPLASTY;  Surgeon: [name], MD;  Location: [facility];  Service: Orthopedics;  Laterality: Left;  ANESTHESIA: GENERAL, PRE/POST OP SCALENE    His Family History Is Significant For: Family History  Problem Relation Age of Onset  . Stroke Father   . Hyperlipidemia Father   . Diabetes Maternal Grandfather   . Heart attack Sister 6  . ALS Brother 26  . Dementia Mother     His Social History Is Significant For: Social History   Social History  . Marital Status: Married    Spouse Name: N/A  . Number of Children:  2  . Years of Education: College   Occupational History  . [employer]     Social History Main Topics  . Smoking status: Former Smoker -- 1.00 packs/day for 8 years    Types: Cigarettes    Start date: 06/07/1970    Quit date: 05/03/1978  . Smokeless tobacco: Former Systems developer    Types: Gary date: 05/03/1978  . Alcohol Use: 0.6 oz/week    1 Cans of beer per week     Comment: occasional shot of taquila  . Drug Use: No  . Sexual Activity: Not Asked   Other Topics Concern  . None   Social History Narrative   Drinks some coffee, Drink diet sodas and tea    His Allergies Are:  No Known  Allergies:   His Current Medications Are:  Outpatient Encounter Prescriptions as of 02/13/2015  Medication Sig  . aspirin 81 MG tablet Take 2 tablets (162 mg total) by mouth daily.  . baclofen (LIORESAL) 10 MG tablet Take 1 tablet (10 mg total) by mouth 3 (three) times daily. As needed for muscle spasm  . Cholecalciferol (VITAMIN D) 400 UNITS capsule Take 400 Units by mouth daily.    . Cyanocobalamin (VITAMIN B12 PO) Take by mouth daily.    Marland Kitchen glyBURIDE micronized (GLYNASE) 6 MG tablet Take 1 tablet (6 mg total) by mouth 2 (two) times daily.  Marland Kitchen lisinopril (PRINIVIL,ZESTRIL) 2.5 MG tablet Take 1 tablet (2.5 mg total) by mouth every morning.  . meclizine (ANTIVERT) 25 MG tablet Take 1 tablet (25 mg total) by mouth 3 (three) times daily as needed for dizziness.  . metFORMIN (GLUCOPHAGE) 500 MG tablet Take 2 tablets (1,000 mg total) by mouth 2 (two) times daily with a meal.  . pravastatin (PRAVACHOL) 20 MG tablet Take 1 tablet (20 mg total) by mouth every morning.  . pregabalin (LYRICA) 100 MG capsule Take 1 capsule (100 mg total) by mouth 3 (three) times daily.  Marland Kitchen topiramate (TOPAMAX) 25 MG tablet Take 2 tablets (50 mg total) by mouth 2 (two) times daily.  . traMADol (ULTRAM) 50 MG tablet   . valACYclovir (VALTREX) 1000 MG tablet   . [DISCONTINUED] penicillin v potassium (VEETID) 500 MG tablet Take 500 mg by mouth every 6 (six) hours.   No facility-administered encounter medications on file as of 02/13/2015.  :  Review of Systems:  Out of a complete 14 point review of systems, all are reviewed and negative with the exception of these symptoms as listed below:  Review of Systems  Constitutional: Positive for fatigue.  HENT:       Spinning sensation   Eyes:       Blurred vision   Cardiovascular: Positive for leg swelling.  Gastrointestinal: Positive for blood in stool.  Endocrine:       Feeling cold   Musculoskeletal: Positive for joint swelling.       Joint pain, aching muscles    Neurological: Positive for dizziness, speech difficulty, weakness, numbness and headaches.       Goes to bed around 10 pm and wakes 1-2 am and cannot go back to sleep. Snoring, wakes up in morning feeling dizzy, groggy, and tired. Morning headaches. Daytime tiredness.   Hematological: Bruises/bleeds easily.  Psychiatric/Behavioral: Positive for confusion.       Depression, anxiety, not enough sleep, decreased energy, disinterest activities, racing thoughts     Objective:  Neurologic Exam  Physical Exam Physical Examination:   Filed Vitals:  02/13/15 1411  BP: 138/78  Pulse: 78  Resp: 18    General Examination: The patient is a very pleasant 54 y.o. male in no acute distress. He appears well-developed and well-nourished and adequately groomed.   HEENT: Normocephalic, atraumatic, pupils are equal, round and reactive to light and accommodation. Funduscopic exam is normal with sharp disc margins noted. Extraocular tracking is good without limitation to gaze excursion or nystagmus noted. Normal smooth pursuit is noted. Hearing is grossly intact. Tympanic membranes are clear bilaterally. Face is symmetric with normal facial animation and normal facial sensation. Speech is clear with no dysarthria noted. There is no hypophonia. There is no lip, neck/head, jaw or voice tremor. Neck is supple with full range of passive and active motion. There are no carotid bruits on auscultation. Oropharynx exam reveals: mild mouth dryness, adequate dental hygiene and moderate airway crowding, due to larger uvula and tonsils about 1-2 +. Mallampati is class II. Tongue protrudes centrally and palate elevates symmetrically. Neck size is 15.5 inches. He has a Absent overbite. Nasal inspection reveals no significant nasal mucosal bogginess or redness and no septal deviation.   Chest: Clear to auscultation without wheezing, rhonchi or crackles noted.  Heart: S1+S2+0, regular and normal without murmurs, rubs or  gallops noted.   Abdomen: Soft, non-tender and non-distended with normal bowel sounds appreciated on auscultation.  Extremities: There is no pitting edema in the distal lower extremities bilaterally. Pedal pulses are intact.  Skin: Warm and dry without trophic changes noted. There are no varicose veins.  Musculoskeletal: exam reveals no obvious joint deformities, tenderness or joint swelling or erythema.   Neurologically:  Mental status: The patient is awake, alert and oriented in all 4 spheres. His immediate and remote memory, attention, language skills and fund of knowledge are appropriate. There is no evidence of aphasia, agnosia, apraxia or anomia. Speech is clear with normal prosody and enunciation. Thought process is linear. Mood is normal and affect is normal.  Cranial nerves II - XII are as described above under HEENT exam. In addition: shoulder shrug is normal with equal shoulder height noted. Motor exam: Normal bulk, strength and tone is noted. There is no drift, tremor or rebound. Romberg is negative. Reflexes are 2+ throughout. Babinski: Toes are flexor bilaterally. Fine motor skills and coordination: intact with normal finger taps, normal hand movements, normal rapid alternating patting, normal foot taps and normal foot agility.  Cerebellar testing: No dysmetria or intention tremor on finger to nose testing. Heel to shin is unremarkable bilaterally. There is no truncal or gait ataxia.  Sensory exam: intact to light touch, pinprick, vibration, temperature sense in the upper extremities and decreased in the feet.  Gait, station and balance: He stands easily. No veering to one side is noted. No leaning to one side is noted. Posture is age-appropriate and stance is narrow based. Gait shows normal stride length and normal pace. No problems turning are noted. He turns en bloc. Tandem walk is challenging for him.   Assessment and Plan:   In summary, Travis [name] is a very pleasant 54  y.o.-year old male with an underlying medical history of hypertension, hyperlipidemia, diabetes, recurrent headaches, prior lacunar strokes, and neck pain, s/p neck surgery, who reports snoring, morning headaches, difficulty maintaing sleep, and excessive daytime somnolence. While not telltale, his history and physical exam are concerning for obstructive sleep apnea (OSA). I had a long chat with the patient about my findings and the diagnosis of OSA, its prognosis and treatment  options. We talked about medical treatments, surgical interventions and non-pharmacological approaches. I explained in particular the risks and ramifications of untreated moderate to severe OSA, especially with respect to developing cardiovascular disease down the Road, including congestive heart failure, difficult to treat hypertension, cardiac arrhythmias, or stroke. Even type 2 diabetes has, in part, been linked to untreated OSA. Symptoms of untreated OSA include daytime sleepiness, memory problems, mood irritability and mood disorder such as depression and anxiety, lack of energy, as well as recurrent headaches, especially morning headaches. We talked about trying to maintain a healthy lifestyle in general, as well as the importance of weight control. I encouraged the patient to eat healthy, exercise daily and keep well hydrated, to keep a scheduled bedtime and wake time routine, to not skip any meals and eat healthy snacks in between meals. I advised the patient not to drive when feeling sleepy. I recommended the following at this time: sleep study with potential positive airway pressure titration. (We will score hypopneas at 3% and split the sleep study into diagnostic and treatment portion, if the estimated. 2 hour AHI is >15/h).   I explained the sleep test procedure to the patient and also outlined possible surgical and non-surgical treatment options of OSA, including the use of a custom-made dental device (which would require a  referral to a specialist dentist or oral surgeon), upper airway surgical options, such as pillar implants, radiofrequency surgery, tongue base surgery, and UPPP (which would involve a referral to an ENT surgeon). Rarely, jaw surgery such as mandibular advancement may be considered.  I also explained the CPAP treatment option to the patient, who indicated that he would be willing to try CPAP if the need arises. I explained the importance of being compliant with PAP treatment, not only for insurance purposes but primarily to improve His symptoms, and for the patient's long term health benefit, including to reduce His cardiovascular risks. I answered all his questions today and the patient was in agreement. I would like to see him back after the sleep study is completed and encouraged him to call with any interim questions, concerns, problems or updates.   Thank you very much for allowing me to participate in the care of this nice patient. If I can be of any further assistance to you please do not hesitate to talk to me.   Sincerely,   Star Age, MD, PhD  I spent 20 minutes in total face-to-face time with the patient, more than 50% of which was spent in counseling and coordination of care, reviewing test results, reviewing medication and discussing or reviewing the diagnosis of OSA, its prognosis and treatment options.

## 2015-02-13 NOTE — Therapy (Signed)
[facility] [address] [place], [place], [address] Phone: [phone]   Fax:  [phone]  Physical Therapy Treatment  Patient Details  Name: Travis [name] MRN: [id number] Date of Birth: 07/15/1960 No Data Recorded  Encounter Date: 02/13/2015      PT End of Session - 02/13/15 1710    Visit Number 15   Number of Visits 18   Date for PT Re-Evaluation 02/25/15   Authorization Type UMR   Authorization Time Period 11/12/14 to 26/20/35; recert done for 5/97 to 11/13   PT Start Time 1650   PT Stop Time 1735   PT Time Calculation (min) 45 min   Equipment Utilized During Treatment Gait belt   Activity Tolerance Patient tolerated treatment well   Behavior During Therapy [facility] for tasks assessed/performed      Past Medical History  Diagnosis Date  . Night sweats     every once in a while  . Hyperlipidemia   . Diabetes mellitus   . Hernia, inguinal   . Dizziness   . Chronic headaches   . Arthritis   . Neuropathy of lower extremity   . Diabetic peripheral neuropathy ([place]) 03/28/2013  . Diverticulosis   . Hypertension   . Complication of anesthesia     bleed after last shoulder-had to stay overnight  . Impingement syndrome of left shoulder 08/02/2014  . 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 Multi-infarct state ([place]) 10/14/2014  . Depression   . Anxiety     Past Surgical History  Procedure Laterality Date  . Inguinal hernia repair Bilateral   . Cervical disc surgery    . Wrist surgery Right     fusion  . Shoulder arthroscopy Right     1  . Knee arthroscopy Right   . Colon surgery  2003    1/3 removed for diverticulitis  . Colonoscopy    . Umbilical hernia repair      with other hernia repair with mesh  . Shoulder arthroscopy Left 08/02/2014    Procedure: LEFT SHOULDER SCOPE DEBRIDEMENT/ACROMIOPLASTY;  Surgeon: [name], MD;  Location: [facility];  Service: Orthopedics;  Laterality: Left;   ANESTHESIA: GENERAL, PRE/POST OP SCALENE    There were no vitals filed for this visit.  Visit Diagnosis:  Vertigo  Unsteadiness  Stiffness of cervical spine      Subjective Assessment - 02/13/15 1653    Subjective Pt reports he had MD apt with Athar at [facility] earlier today and they are going to schedule a sleep study with him.  Currently c/o headaches anterior portion of head.  Pt stated he feels his balance deficits may be related to his Lt hip pain scale 8/10   Pertinent History Vertigo started about 2 years ago; just came on out of nowhere. Right now dizziness is 10/10. Seems like every time it sets off, something in neck seems to pop. Addressed shoulder first, now coming for vertigo. Had neck surgery about 5-6 years ago .   Currently in Pain? Yes   Pain Score 6    Pain Location Head   Pain Descriptors / Indicators Headache            [facility] PT Assessment - 02/13/15 0001    ROM / Strength   AROM / PROM / Strength Strength   Strength   Strength Assessment Site Hip   Right Hip ABduction 4/5   Left Hip ABduction 3+/5  Balance Exercises - 02/13/15 1720    Balance Exercises: Standing   Standing Eyes Closed Narrow base of support (BOS);Foam/compliant surface;3 reps;20 secs   Tandem Stance Eyes closed;Foam/compliant surface;3 reps;30 secs   Wall Bumps Shoulder   Wall Bumps-Shoulders Eyes opened;Anterior/posterior;20 reps   Balance Beam 1RT Tandem gait   Sidestepping 3 reps;Theraband  GTB             PT Short Term Goals - 01/02/15 1709    PT SHORT TERM GOAL #1   Title Patient will experience no more than 5/10 dizziness at all times    Baseline 9/1- patient reports if he bends over to get something his dizziness gets worse    Time 4   Period Weeks   Status On-going   PT SHORT TERM GOAL #2   Title Patient will demonstrate cervical ROM to be improved by at least 10 degrees all planes    Time 4   Period Weeks   Status On-going   PT SHORT  TERM GOAL #3   Title Patient to demonstrate the ability to correctly and consistently perform HEP, to be updated PRN    Time 4   Period Weeks   Status Achieved           PT Long Term Goals - 01/02/15 1712    PT LONG TERM GOAL #1   Title Patient to experience no more than 2/10 dizziness at worst    Time 8   Period Weeks   Status On-going   PT LONG TERM GOAL #2   Title Patient to report that he has been able to return to functional sports activities such as baseball with no increase in dizziness    Period Weeks   Status On-going   PT LONG TERM GOAL #3   Title Cervical ROM to be within normal limits to reduce dizziness and reduce chances of overall recurrence of condition   Time 8   Period Weeks   Status On-going   PT LONG TERM GOAL #4   Title Patient to demonstrate improved balance and proprioception as evidenced by an ability to maintain SLS on foam pad with eyes closed for at least 20 seconds each leg    Time 8   Period Weeks   Status On-going               Plan - 02/13/15 1737    Clinical Impression Statement Session focus on improving standing balance on static and dynamic surfaces.  Pt c/o dizziness through session with BP at 155/90 at beginning of session and 140/89 at end of sesson.  Progressed to balance beam with min assistance required and noted weak Lt ankle and hip stabilty.  MMT complete for abduction with Lt glut med at 3+/5 and 4/5.  Pt explained importance of gluteal strengthening to assist with balance.  Continued with theraband abduction for glut strengthening.  Pt stated dizzy level 5/10 and Lt hip pain scale 8/10 at end of session.    PT Next Visit Plan Focus primarily on balance, develop appropriate HEP for use after DC. Take BP at beginning and end of each session, also if patient has new dizzy spell during session.          Problem List Patient Active Problem List   Diagnosis Date Noted  . Snoring 01/23/2015  . Degeneration of cervical  intervertebral disc 01/23/2015  . Essential hypertension 12/14/2014  . Type 2 diabetes mellitus with other circulatory complications ([place]) 24/01/7352  . HLD (  hyperlipidemia) 12/14/2014  . Neck pain 12/14/2014  . Cerebral infarction, chronic   . Dizzy   . Vertigo 10/14/2014  . Multi-infarct state ([place]) 10/14/2014  . Hypertension 10/14/2014  . CVA (cerebral infarction) 10/14/2014  . Impingement syndrome of left shoulder 08/02/2014  . 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 Abdominal pain 06/13/2014  . Rectal bleeding 03/13/2014  . Diabetes type 2, controlled ([place]) 02/18/2014  . Hyperlipidemia 02/18/2014  . Diabetic peripheral neuropathy ([place]) 03/28/2013  . Irreducible incisional hernia 11/11/2010   Ihor Austin, [place]; [place]   Aldona Lento 02/13/2015, 5:44 PM  [facility] [place] [place], [place], [address] Phone: [phone]   Fax:  [phone]  Name: Travis [name] MRN: [id number] Date of Birth: December 24, 1960

## 2015-02-13 NOTE — Patient Instructions (Addendum)

## 2015-02-18 ENCOUNTER — Encounter (HOSPITAL_COMMUNITY): Payer: Self-pay | Admitting: Speech Pathology

## 2015-02-18 ENCOUNTER — Ambulatory Visit (HOSPITAL_COMMUNITY): Payer: 59 | Admitting: Physical Therapy

## 2015-02-18 ENCOUNTER — Ambulatory Visit (HOSPITAL_COMMUNITY): Payer: 59 | Admitting: Speech Pathology

## 2015-02-18 DIAGNOSIS — R41841 Cognitive communication deficit: Secondary | ICD-10-CM

## 2015-02-18 DIAGNOSIS — R42 Dizziness and giddiness: Secondary | ICD-10-CM | POA: Diagnosis not present

## 2015-02-18 DIAGNOSIS — R2681 Unsteadiness on feet: Secondary | ICD-10-CM

## 2015-02-18 DIAGNOSIS — M436 Torticollis: Secondary | ICD-10-CM

## 2015-02-18 NOTE — Therapy (Signed)
[place], [place], [address] Phone: [phone]   Fax:  [phone]  Speech Language Pathology Evaluation  Patient Details  Name: Travis [name] MRN: [id number] Date of Birth: [birth year] Referring Provider: Dr. [name]  Encounter Date: 02/18/2015      End of Session - 02/18/15 1640    Visit Number 1   Number of Visits 8   Date for SLP Re-Evaluation 04/17/15   Authorization Type UMR   SLP Start Time 1600   SLP Stop Time  1645   SLP Time Calculation (min) 45 min      Past Medical History  Diagnosis Date  . Night sweats     every once in a while  . Hyperlipidemia   . Diabetes mellitus   . Hernia, inguinal   . Dizziness   . Chronic headaches   . Arthritis   . Neuropathy of lower extremity   . Diabetic peripheral neuropathy ([place]) 03/28/2013  . Diverticulosis   . Hypertension   . Complication of anesthesia     bleed after last shoulder-had to stay overnight  . Impingement syndrome of left shoulder 08/02/2014  . 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 Multi-infarct state ([place]) 10/14/2014  . Depression   . Anxiety     Past Surgical History  Procedure Laterality Date  . Inguinal hernia repair Bilateral   . Cervical disc surgery    . Wrist surgery Right     fusion  . Shoulder arthroscopy Right     1  . Knee arthroscopy Right   . Colon surgery  2003    1/3 removed for diverticulitis  . Colonoscopy    . Umbilical hernia repair      with other hernia repair with mesh  . Shoulder arthroscopy Left 08/02/2014    Procedure: LEFT SHOULDER SCOPE DEBRIDEMENT/ACROMIOPLASTY;  Surgeon: [name], MD;  Location: [facility];  Service: Orthopedics;  Laterality: Left;  ANESTHESIA: GENERAL, PRE/POST OP SCALENE    There were no vitals filed for this visit.  Visit Diagnosis: Cognitive communication deficit      Subjective Assessment - 02/18/15 1633    Subjective "I don't get  all my words out correctly."   Special Tests [facility]- Self Administered Gerocognitive Evaluation            SLP Evaluation [facility] - 02/18/15 1634    SLP Visit Information   SLP Received On 02/18/15   Referring Provider Dr. [name]   Onset Date 01/29/2105   Medical Diagnosis s/p CVA with speech and cognitive changes   Subjective   Subjective "I don't get all my words out correctly."   General Information   Behavioral/Cognition Alert, cooperative, reports decreased working memory   Mobility Status Ambulatory, gets dizzy   Prior Functional Status   Cognitive/Linguistic Baseline Baseline deficits   Type of Home House    Lives With Spouse;Daughter   Vocation Full time employment   Cognition   Overall Cognitive Status Impaired/Different from baseline   Auditory Comprehension   Overall Auditory Comprehension Appears within functional limits for tasks assessed   Yes/No Questions Within Functional Limits   Commands Within Functional Limits   Conversation Complex   Interfering Components Pain;Working Designer, multimedia Within [employer]   Reading Comprehension   Reading Status Within funtional limits   Expression   Primary Mode of Expression Verbal   Verbal Expression  Overall Verbal Expression Appears within functional limits for tasks assessed   Initiation No impairment   Level of Generative/Spontaneous Verbalization Conversation   Repetition No impairment   Naming No impairment   Pragmatics No impairment   Interfering Components Attention   Non-Verbal Means of Communication Not applicable   Written Expression   Dominant Hand Right   Written Expression Within Functional Limits   Oral Motor/Sensory Function   Overall Oral Motor/Sensory Function Appears within functional limits for tasks assessed   Motor Speech   Overall Motor Speech Appears within functional limits for tasks assessed    Respiration Within functional limits   Phonation Normal   Resonance Within functional limits   Articulation Within functional limitis   Intelligibility Intelligible   Motor Planning Witnin functional limits   Motor Speech Errors Not applicable   Phonation WFL   Standardized Assessments   Standardized Assessments  Self-Administered Gero-Cognitive Examination              SLP Short Term Goals - 02/18/15 1645    SLP SHORT TERM GOAL #1   Title Pt will implement memory strategies in functional tasks for 90% acc with min assist   Time 6   Period Weeks   Status New   SLP SHORT TERM GOAL #2   Title Pt will complete mod level thought organization and deductive reasoning tasks with 90% acc with use of strategies and min assist.    Time 6   Period Weeks   Status New   SLP SHORT TERM GOAL #3   Title Pt will identify appropriate strategies to use in hypothetical everyday problem solving scenarios with min assist.    Time 6   Period Weeks   Status New   SLP SHORT TERM GOAL #4   Title Pt will    Time 6   Period Weeks   Status New          SLP Long Term Goals - 02/18/15 1646    SLP LONG TERM GOAL #1   Title Pt will communicate complex thoughts and feelings to [facility] with use of strategies. Pt will also be independent with use of memory and thought organization strategies.          Plan - 02/18/15 1641    Clinical Impression Statement [name] presents with mild cogntive communication deficits characterized by decreased thought organization and working memory resulting in decreased verbal fluency with halting speech at times, decreased topic maintenance, and decreased follow through with prospective memory tasks at [employer]. Pt is highly motivated to implement strategies to assist with memory and communication.    Speech Therapy Frequency 2x / week   Duration 4 weeks   Treatment/Interventions Functional tasks;SLP instruction and feedback;Compensatory  strategies;Patient/family education;Internal/external aids;Cognitive reorganization;Cueing hierarchy   Potential to Achieve Goals Good   Potential Considerations Co-morbidities   SLP Home Exercise Plan Pt will be independent in HEP as assigned to facilitate carryover of treatment strategies and techniques in home and community environment   Consulted and Agree with Plan of Care Patient        Problem List Patient Active Problem List   Diagnosis Date Noted  . Snoring 01/23/2015  . Degeneration of cervical intervertebral disc 01/23/2015  . Essential hypertension 12/14/2014  . Type 2 diabetes mellitus with other circulatory complications ([place]) 84/16/6063  . HLD (hyperlipidemia) 12/14/2014  . Neck pain 12/14/2014  . Cerebral infarction, chronic   . Dizzy   . Vertigo 10/14/2014  . Multi-infarct state ([place])  10/14/2014  . Hypertension 10/14/2014  . CVA (cerebral infarction) 10/14/2014  . Impingement syndrome of left shoulder 08/02/2014  . 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 Abdominal pain 06/13/2014  . Rectal bleeding 03/13/2014  . Diabetes type 2, controlled ([place]) 02/18/2014  . Hyperlipidemia 02/18/2014  . Diabetic peripheral neuropathy ([place]) 03/28/2013  . Irreducible incisional hernia 11/11/2010   Thank you,  Genene Churn, [place]  Genene Churn 02/18/2015, 6:08 PM  [facility] [address] [place], [place], [address] Phone: [phone]   Fax:  [phone]  Name: Travis [name] MRN: [id number] Date of Birth: 1961-04-28

## 2015-02-18 NOTE — Therapy (Signed)
[facility] [address] [place], [place], [address] Phone: [phone]   Fax:  [phone]  Physical Therapy Treatment  Patient Details  Name: Travis [name] MRN: [id number] Date of Birth: 1961/03/27 No Data Recorded  Encounter Date: 02/18/2015      PT End of Session - 02/18/15 1744    Visit Number 16   Number of Visits 18   Date for PT Re-Evaluation 02/25/15   Authorization Type UMR   Authorization Time Period 11/12/14 to 10/24/74; recert done for 2/83 to 11/13   PT Start Time 1656   PT Stop Time 1735   PT Time Calculation (min) 39 min   Activity Tolerance Patient tolerated treatment well   Behavior During Therapy [facility] for tasks assessed/performed      Past Medical History  Diagnosis Date  . Night sweats     every once in a while  . Hyperlipidemia   . Diabetes mellitus   . Hernia, inguinal   . Dizziness   . Chronic headaches   . Arthritis   . Neuropathy of lower extremity   . Diabetic peripheral neuropathy ([place]) 03/28/2013  . Diverticulosis   . Hypertension   . Complication of anesthesia     bleed after last shoulder-had to stay overnight  . Impingement syndrome of left shoulder 08/02/2014  . 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 Multi-infarct state ([place]) 10/14/2014  . Depression   . Anxiety     Past Surgical History  Procedure Laterality Date  . Inguinal hernia repair Bilateral   . Cervical disc surgery    . Wrist surgery Right     fusion  . Shoulder arthroscopy Right     1  . Knee arthroscopy Right   . Colon surgery  2003    1/3 removed for diverticulitis  . Colonoscopy    . Umbilical hernia repair      with other hernia repair with mesh  . Shoulder arthroscopy Left 08/02/2014    Procedure: LEFT SHOULDER SCOPE DEBRIDEMENT/ACROMIOPLASTY;  Surgeon: [name], MD;  Location: [facility];  Service: Orthopedics;  Laterality: Left;  ANESTHESIA: GENERAL, PRE/POST OP SCALENE    There  were no vitals filed for this visit.  Visit Diagnosis:  Vertigo  Unsteadiness  Stiffness of cervical spine      Subjective Assessment - 02/18/15 1742    Subjective Patient reports that he had a fair weekend, was dealing with quite a bit of dizziness and just other life events that made it a bit rough    Pertinent History Vertigo started about 2 years ago; just came on out of nowhere. Right now dizziness is 10/10. Seems like every time it sets off, something in neck seems to pop. Addressed shoulder first, now coming for vertigo. Had neck surgery about 5-6 years ago .   Currently in Pain? Yes   Pain Score 3    Pain Location Hip   Pain Orientation Left            [facility] PT Assessment - 02/18/15 1715    Observation/Other Assessments   Observations L hip grind test and flexion/adduction/internal rotation test positive, symptoms relieved with distraction and FABER position                      [facility] Adult PT Treatment/Exercise - 02/18/15 0001    Neck Exercises: Seated   Other Seated Exercise Sidelying hip ABD 2x10; clams 2x15 each; hip ABD walks  2x54ft              Balance Exercises - 02/18/15 1739    Balance Exercises: Standing   Rockerboard Anterior/posterior;Lateral;Other (comment)  no hha    Gait with Head Turns Forward;Other (comment)  horizontal and vertical head turns            PT Education - 02/18/15 1743    Education provided Yes   Education Details educated patient regarding possible referral to MD asking for permission to further assess/treat hip however patient defrerred at this point    Person(s) Educated Patient   Methods Explanation   Comprehension Verbalized understanding          PT Short Term Goals - 01/02/15 1709    PT SHORT TERM GOAL #1   Title Patient will experience no more than 5/10 dizziness at all times    Baseline 9/1- patient reports if he bends over to get something his dizziness gets worse    Time 4   Period Weeks    Status On-going   PT SHORT TERM GOAL #2   Title Patient will demonstrate cervical ROM to be improved by at least 10 degrees all planes    Time 4   Period Weeks   Status On-going   PT SHORT TERM GOAL #3   Title Patient to demonstrate the ability to correctly and consistently perform HEP, to be updated PRN    Time 4   Period Weeks   Status Achieved           PT Long Term Goals - 01/02/15 1712    PT LONG TERM GOAL #1   Title Patient to experience no more than 2/10 dizziness at worst    Time 8   Period Weeks   Status On-going   PT LONG TERM GOAL #2   Title Patient to report that he has been able to return to functional sports activities such as baseball with no increase in dizziness    Period Weeks   Status On-going   PT LONG TERM GOAL #3   Title Cervical ROM to be within normal limits to reduce dizziness and reduce chances of overall recurrence of condition   Time 8   Period Weeks   Status On-going   PT LONG TERM GOAL #4   Title Patient to demonstrate improved balance and proprioception as evidenced by an ability to maintain SLS on foam pad with eyes closed for at least 20 seconds each leg    Time 8   Period Weeks   Status On-going               Plan - 02/18/15 1744    Clinical Impression Statement Focused on hip abductors today with sidelying supersets and hip ABD walks; aptient did complain of significant pain in L hip and was screened, revealing positive symptoms with hip compression (grind and flexion/adduction/internal rotation test) and relief with hip distraction and FABER test. Educated patient regarding need for MD refrral to more extensively examine and treat hip but he deferred at this time, stated that he may do this later on. Otherwise focused on balance today with rockerboard  and head turns with gait. Blood pruessure 140/88 at end of sesion with no exacerbation of symptoms past baseline at beginning of ssesion.    Pt will benefit from skilled  therapeutic intervention in order to improve on the following deficits Decreased range of motion;Dizziness;Decreased activity tolerance;Impaired vision/preception;Postural dysfunction   Rehab Potential Good   PT Frequency  Other (comment)   PT Duration Other (comment)   PT Treatment/Interventions ADLs/Self Care Home Management;Therapeutic activities;Therapeutic exercise;Balance training;Neuromuscular re-education;Patient/family education;Manual techniques;Vestibular;Visual/perceptual remediation/compensation   PT Next Visit Plan Focus primarily on balance, develop appropriate HEP for use after DC. Take BP at beginning and end of each session, also if patient has new dizzy spell during session.  Work on hip abductor musculature as well for assist in balance.    PT Home Exercise Plan updated    Consulted and Agree with Plan of Care Patient        Problem List Patient Active Problem List   Diagnosis Date Noted  . Snoring 01/23/2015  . Degeneration of cervical intervertebral disc 01/23/2015  . Essential hypertension 12/14/2014  . Type 2 diabetes mellitus with other circulatory complications ([place]) 21/22/4825  . HLD (hyperlipidemia) 12/14/2014  . Neck pain 12/14/2014  . Cerebral infarction, chronic   . Dizzy   . Vertigo 10/14/2014  . Multi-infarct state ([place]) 10/14/2014  . Hypertension 10/14/2014  . CVA (cerebral infarction) 10/14/2014  . Impingement syndrome of left shoulder 08/02/2014  . 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 Abdominal pain 06/13/2014  . Rectal bleeding 03/13/2014  . Diabetes type 2, controlled ([place]) 02/18/2014  . Hyperlipidemia 02/18/2014  . Diabetic peripheral neuropathy ([place]) 03/28/2013  . Irreducible incisional hernia 11/11/2010    Deniece Ree PT, DPT [facility] [address] [place], [place], [address] Phone: [phone]   Fax:  [phone]  Name: IZAYA NETHERTON MRN:  [id number] Date of Birth: 11-07-1960

## 2015-02-20 ENCOUNTER — Ambulatory Visit (HOSPITAL_COMMUNITY): Payer: 59 | Admitting: Physical Therapy

## 2015-02-20 ENCOUNTER — Other Ambulatory Visit: Payer: Self-pay | Admitting: Family Medicine

## 2015-02-24 ENCOUNTER — Telehealth (HOSPITAL_COMMUNITY): Payer: Self-pay | Admitting: Physical Therapy

## 2015-02-24 ENCOUNTER — Telehealth: Payer: Self-pay | Admitting: Family Medicine

## 2015-02-24 NOTE — Telephone Encounter (Signed)
[facility] is calling to get a referral from [place] for the pt  To be seen with them, however we did not send him there  It was Dr Roderic Palau referred him for his hip for a Hospitalist  Can we move forward with this referral   [place], [phone]

## 2015-02-24 NOTE — Telephone Encounter (Signed)
Per [name] . Patient will need a office visit here for [place] to order therapy.  Notified PT that patient would need an office visit for referral for Pt on his hip(we have not seen the patient for this issue.)

## 2015-02-24 NOTE — Telephone Encounter (Signed)
Called Dr. Lance Sell office to request referral to treat patient's painful hip; office to call PT back.   Deniece Ree PT, DPT [phone]

## 2015-02-24 NOTE — Telephone Encounter (Signed)
plz give referral thanks

## 2015-02-25 ENCOUNTER — Ambulatory Visit (HOSPITAL_COMMUNITY): Payer: 59

## 2015-02-25 DIAGNOSIS — R42 Dizziness and giddiness: Secondary | ICD-10-CM

## 2015-02-25 DIAGNOSIS — M436 Torticollis: Secondary | ICD-10-CM

## 2015-02-25 DIAGNOSIS — R2681 Unsteadiness on feet: Secondary | ICD-10-CM

## 2015-02-25 NOTE — Therapy (Signed)
[facility] [address] [place], [place], [address] Phone: [phone]   Fax:  [phone]  Physical Therapy Treatment  Patient Details  Name: Travis [name] MRN: [id number] Date of Birth: 09-29-1960 Referring Provider: Stephens Shire  Encounter Date: 02/25/2015      PT End of Session - 02/25/15 1756    Visit Number 17   Number of Visits 18   Date for PT Re-Evaluation 02/25/15   Authorization Type UMR   Authorization Time Period 11/12/14 to 93/79/02; recert done for 4/09 to 11/13   PT Start Time 1655   PT Stop Time 1743   PT Time Calculation (min) 48 min   Equipment Utilized During Treatment Gait belt   Activity Tolerance Patient tolerated treatment well   Behavior During Therapy [facility] for tasks assessed/performed      Past Medical History  Diagnosis Date  . Night sweats     every once in a while  . Hyperlipidemia   . Diabetes mellitus   . Hernia, inguinal   . Dizziness   . Chronic headaches   . Arthritis   . Neuropathy of lower extremity   . Diabetic peripheral neuropathy ([place]) 03/28/2013  . Diverticulosis   . Hypertension   . Complication of anesthesia     bleed after last shoulder-had to stay overnight  . Impingement syndrome of left shoulder 08/02/2014  . 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 Multi-infarct state ([place]) 10/14/2014  . Depression   . Anxiety     Past Surgical History  Procedure Laterality Date  . Inguinal hernia repair Bilateral   . Cervical disc surgery    . Wrist surgery Right     fusion  . Shoulder arthroscopy Right     1  . Knee arthroscopy Right   . Colon surgery  2003    1/3 removed for diverticulitis  . Colonoscopy    . Umbilical hernia repair      with other hernia repair with mesh  . Shoulder arthroscopy Left 08/02/2014    Procedure: LEFT SHOULDER SCOPE DEBRIDEMENT/ACROMIOPLASTY;  Surgeon: [name], MD;  Location: [facility];  Service: Orthopedics;   Laterality: Left;  ANESTHESIA: GENERAL, PRE/POST OP SCALENE    There were no vitals filed for this visit.  Visit Diagnosis:  Vertigo  Unsteadiness  Stiffness of cervical spine      Subjective Assessment - 02/25/15 1657    Subjective Pt stated he continues to have headaches and dizziness, pain scale 4/10 with reports of black spots.  Hip pain scale 5/10   Currently in Pain? Yes   Pain Score 5    Pain Location Hip   Pain Orientation Left   Pain Descriptors / Indicators Aching   Multiple Pain Sites Yes   Pain Score 4   Pain Location Head   Pain Descriptors / Indicators Headache            [facility] PT Assessment - 02/25/15 0001    Assessment   Medical Diagnosis vertigo    Referring Provider Jehanex Memon   Onset Date/Surgical Date --  2 years ago   Next MD Visit [place]   Cognition   Overall Cognitive Status Impaired/Different from baseline   AROM   Cervical Flexion 61  was 54   Cervical Extension 28  was 29   Cervical - Right Side Bend 46  was 59   Cervical - Left Side Bend 46  was 52   Cervical - Right  Rotation 66  was 69   Cervical - Left Rotation 63  was 65   Strength   Strength Assessment Site Hip   Right Hip ABduction 4/5   Left Hip ABduction 3+/5           [facility] Adult PT Treatment/Exercise - 02/25/15 0001    Neck Exercises: Seated   Other Seated Exercise 3D cervical and throacic excursions 1x15   Neck Exercises: Sidelying   Other Sidelying Exercise Bil hip abduction 20x             Balance Exercises - 02/25/15 1806    Balance Exercises: Standing   Tandem Stance Eyes open;3 reps;30 secs   SLS Eyes open;5 reps;Solid surface  Lt 24", Rt 26; Foam Lt 12", Rt 14"   Rockerboard Anterior/posterior;Lateral  2 minutes   Sidestepping 2 reps             PT Short Term Goals - 02/25/15 1659    PT SHORT TERM GOAL #1   Title Patient will experience no more than 5/10 dizziness at all times    Baseline 02/25/2015:  Reports experience dizziness  daily; bad experiences with blackout spells at least 1x a week.  Reports reduction in dizziness with therapy, no reduction in headaches.   Status Not Met   PT SHORT TERM GOAL #2   Title Patient will demonstrate cervical ROM to be improved by at least 10 degrees all planes    Status Not Met   PT SHORT TERM GOAL #3   Title Patient to demonstrate the ability to correctly and consistently perform HEP, to be updated PRN    Baseline 02/25/2015: Reports compliance 1-2 x a week plus therapy.   Status Achieved           PT Long Term Goals - 02/25/15 1703    PT LONG TERM GOAL #1   Title Patient to experience no more than 2/10 dizziness at worst    Status Not Met   PT LONG TERM GOAL #2   Title Patient to report that he has been able to return to functional sports activities such as baseball with no increase in dizziness    Status Not Met   PT LONG TERM GOAL #3   Title Cervical ROM to be within normal limits to reduce dizziness and reduce chances of overall recurrence of condition   Status Not Met   PT LONG TERM GOAL #4   Title Patient to demonstrate improved balance and proprioception as evidenced by an ability to maintain SLS on foam pad with eyes closed for at least 20 seconds each leg    Baseline 02/25/2015:  SLS on solid surfaces at Rt 26", Lt 24"; Foam Lt 12" Rt 14" max of 3   Status Not Met               Plan - 02/25/15 1756    Clinical Impression Statement Reassessment complete with the following findings:  Pt reports compliance with HEP 2x a week and PT 2x a week.  Pt stated he continues to have dizziness daily but has reduced to bad blackouts to 1-2x a week.  Pt continues to have headaches daily with no reports of reduction with therapy.  Cervical ROM has made no improvements except for cervical flexion.  Pt continues to demonstrate risk of fall due to balance and weakness.  Noted significant weakness Bil glut meds are 3+ to 4/5.  Evaluated PT has called MD for hip referral, pt.  needs  to make apt with MD prior referral for hip pain.  Pt educated on importance of completing HEP more frequently to improve balance and cervical ROM.  Pt given advanced HEP to complete at home.  Recommend discharge as minimal improvements noted since last reassessed.     Pt will benefit from skilled therapeutic intervention in order to improve on the following deficits Decreased range of motion;Dizziness;Decreased activity tolerance;Impaired vision/preception;Postural dysfunction   PT Treatment/Interventions ADLs/Self Care Home Management;Therapeutic activities;Therapeutic exercise;Balance training;Neuromuscular re-education;Patient/family education;Manual techniques;Vestibular;Visual/perceptual remediation/compensation   PT Next Visit Plan Recommend D/C to HEP.     PT Home Exercise Plan given        Problem List Patient Active Problem List   Diagnosis Date Noted  . Snoring 01/23/2015  . Degeneration of cervical intervertebral disc 01/23/2015  . Essential hypertension 12/14/2014  . Type 2 diabetes mellitus with other circulatory complications ([place]) 43/56/8616  . HLD (hyperlipidemia) 12/14/2014  . Neck pain 12/14/2014  . Cerebral infarction, chronic   . Dizzy   . Vertigo 10/14/2014  . Multi-infarct state ([place]) 10/14/2014  . Hypertension 10/14/2014  . CVA (cerebral infarction) 10/14/2014  . Impingement syndrome of left shoulder 08/02/2014  . 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 Abdominal pain 06/13/2014  . Rectal bleeding 03/13/2014  . Diabetes type 2, controlled ([place]) 02/18/2014  . Hyperlipidemia 02/18/2014  . Diabetic peripheral neuropathy ([place]) 03/28/2013  . Irreducible incisional hernia 11/11/2010   Ihor Austin, LPTA; CBIS [phone]  Aldona Lento 02/25/2015, 6:16 PM   PHYSICAL THERAPY DISCHARGE SUMMARY  Visits from Start of Care: 17  Current functional level related to goals / functional outcomes: Patient continues to experience  significant periods of dizziness and unsteadiness, also continues to be impaired by visual field limitations as well as hip pain. Patient has not made significant progress with skilled PT services, and therapy has focused on cervical ROM, dizziness, and balance reaction strategies as well as functional strengthening for hip abductors to assist in improving balance throughout course of care. Patient has been stating that his hip has been hurting him and was educated that he will need a new MD referral for PT to specifically work on his hip.    Remaining deficits: Dizziness, hip pain, unsteadiness, cervical stiffness, fall risk    Education / Equipment: Updated HEP, needs to see Dr. Wolfgang Phoenix for potential hip pain referral Plan: Patient agrees to discharge.  Patient goals were not met. Patient is being discharged due to lack of progress.  ?????        Deniece Ree PT, DPT [facility] [address] [place], [place], [address] Phone: [phone]   Fax:  [phone]  Name: CARYL MANAS MRN: [id number] Date of Birth: Dec 21, 1960

## 2015-02-25 NOTE — Patient Instructions (Signed)
SINGLE LIMB STANCE    Stance: single leg on floor. Raise leg. Hold 30 seconds. Repeat with other leg. 3 reps per set, 1-2 sets per day, 3-5 days per week  Copyright  VHI. All rights reserved.   Tandem Stance    Right foot in front of left, heel touching toe both feet "straight ahead" with head rotation movements standing close to countertop for hand held assistance. Stand on Foot Triangle of Support with both feet. Balance in this position 30 seconds. Do with left foot in front of right.  Copyright  VHI. All rights reserved.   Abduction: Side Leg Lift (Eccentric) - Side-Lying    Lie on side. Lift top leg slightly higher than shoulder level. Keep top leg straight with body, toes pointing forward. Slowly lower for 3-5 seconds. 10-20 reps per set, 1-2 sets per day, 3-5days per week. http://ecce.exer.us/62   Copyright  VHI. All rights reserved.   Band Walk: Side Stepping    Tie band around legs, just above knees. Step15  feet to one side, then step back to start. Repeat ___ feet per session. Note: Small towel between band and skin eases rubbing. http://plyo.exer.us/76   Copyright  VHI. All rights reserved.

## 2015-02-26 ENCOUNTER — Encounter: Payer: Self-pay | Admitting: Neurology

## 2015-02-26 ENCOUNTER — Telehealth: Payer: Self-pay | Admitting: Family Medicine

## 2015-02-26 ENCOUNTER — Ambulatory Visit (INDEPENDENT_AMBULATORY_CARE_PROVIDER_SITE_OTHER): Payer: Self-pay | Admitting: Neurology

## 2015-02-26 ENCOUNTER — Ambulatory Visit (INDEPENDENT_AMBULATORY_CARE_PROVIDER_SITE_OTHER): Payer: 59 | Admitting: Neurology

## 2015-02-26 DIAGNOSIS — E1142 Type 2 diabetes mellitus with diabetic polyneuropathy: Secondary | ICD-10-CM

## 2015-02-26 NOTE — Progress Notes (Signed)
Please refer to EMG and nerve conduction study procedure note. 

## 2015-02-26 NOTE — Procedures (Signed)
     HISTORY:  Travis [name] is a 54 year old gentleman with a history of diabetes, and a history of cerebrovascular disease with a recent stroke. He has had episodes of headache, dizziness, and gait instability since the stroke. He is being evaluated for possible diabetic peripheral neuropathy. He does report paresthesias on all 4 extremities. This has been present for least one year.  NERVE CONDUCTION STUDIES:  Nerve conduction studies were performed on both upper extremities. The distal motor latencies and motor amplitudes for the median and ulnar nerves were within normal limits. The F wave latencies and nerve conduction velocities for these nerves were also normal. The sensory latencies for the median and ulnar nerves were normal.  Nerve conduction studies were performed on the left lower extremity. The distal motor latencies and motor amplitudes for the peroneal and posterior tibial nerves were within normal limits. The nerve conduction velocities for these nerves were also normal. The sensory latency for the peroneal nerve was within normal limits.   EMG STUDIES:  EMG study was performed on the left lower extremity:  The tibialis anterior muscle reveals 2 to 4K motor units with full recruitment. No fibrillations or positive waves were seen. The peroneus tertius muscle reveals 2 to 4K motor units with full recruitment. No fibrillations or positive waves were seen. The medial gastrocnemius muscle reveals 1 to 3K motor units with full recruitment. One run of positive waves was seen. The vastus lateralis muscle reveals 2 to 4K motor units with full recruitment. No fibrillations or positive waves were seen. The iliopsoas muscle reveals 2 to 4K motor units with full recruitment. No fibrillations or positive waves were seen. The biceps femoris muscle (long head) reveals 2 to 4K motor units with full recruitment. No fibrillations or positive waves were seen. The lumbosacral paraspinal muscles  were tested at 3 levels, and revealed no abnormalities of insertional activity at all 3 levels tested. There was good relaxation.   IMPRESSION:  Nerve conduction studies done on both upper extremities and on the left lower extremity were unremarkable, without evidence of a peripheral neuropathy. A small fiber neuropathy may be missed by a standard nerve conduction studies, however. Clinical correlation is required. EMG evaluation of the left lower extremity was relatively unremarkable, without evidence of an overlying lumbosacral radiculopathy.  Jill Alexanders MD 02/26/2015 4:37 PM  [facility] [address] [place], [place] [address]  Phone [phone] Fax [phone]

## 2015-02-26 NOTE — Telephone Encounter (Signed)
Please let patient know that nerve conduction study done by neurology did not show peripheral neuropathy or at least did not detect with that test. I would recommend that the patient discuss the symptoms further with neurology on his next visit with them.(According to Epic patient does have follow-up visits with neurology to discuss his sleep study once it is completed)

## 2015-02-27 NOTE — Telephone Encounter (Signed)
Notified wife Building services engineer) that nerve conduction study done by neurology did not show peripheral neuropathy or at least did not detect with that test. Recommend that the patient discuss the symptoms further with neurology on his next visit with them. Wife verbalized understanding.

## 2015-02-28 ENCOUNTER — Inpatient Hospital Stay (HOSPITAL_COMMUNITY): Payer: 59

## 2015-02-28 ENCOUNTER — Emergency Department (HOSPITAL_COMMUNITY): Payer: 59

## 2015-02-28 ENCOUNTER — Inpatient Hospital Stay (HOSPITAL_COMMUNITY)
Admission: EM | Admit: 2015-02-28 | Discharge: 2015-03-03 | DRG: 065 | Disposition: A | Discharge status: Home Health | Payer: 59 | Attending: Family Medicine | Admitting: Family Medicine

## 2015-02-28 ENCOUNTER — Encounter (HOSPITAL_COMMUNITY): Payer: Self-pay

## 2015-02-28 ENCOUNTER — Telehealth: Payer: Self-pay | Admitting: Neurology

## 2015-02-28 DIAGNOSIS — Z7984 Long term (current) use of oral hypoglycemic drugs: Secondary | ICD-10-CM | POA: Diagnosis not present

## 2015-02-28 DIAGNOSIS — G119 Hereditary ataxia, unspecified: Secondary | ICD-10-CM | POA: Diagnosis present

## 2015-02-28 DIAGNOSIS — I639 Cerebral infarction, unspecified: Secondary | ICD-10-CM | POA: Diagnosis present

## 2015-02-28 DIAGNOSIS — E1142 Type 2 diabetes mellitus with diabetic polyneuropathy: Secondary | ICD-10-CM | POA: Diagnosis present

## 2015-02-28 DIAGNOSIS — G8191 Hemiplegia, unspecified affecting right dominant side: Secondary | ICD-10-CM | POA: Diagnosis present

## 2015-02-28 DIAGNOSIS — Z87891 Personal history of nicotine dependence: Secondary | ICD-10-CM | POA: Diagnosis not present

## 2015-02-28 DIAGNOSIS — Z823 Family history of stroke: Secondary | ICD-10-CM | POA: Diagnosis not present

## 2015-02-28 DIAGNOSIS — I6789 Other cerebrovascular disease: Secondary | ICD-10-CM | POA: Diagnosis not present

## 2015-02-28 DIAGNOSIS — Z8673 Personal history of transient ischemic attack (TIA), and cerebral infarction without residual deficits: Secondary | ICD-10-CM | POA: Diagnosis not present

## 2015-02-28 DIAGNOSIS — Z833 Family history of diabetes mellitus: Secondary | ICD-10-CM

## 2015-02-28 DIAGNOSIS — E785 Hyperlipidemia, unspecified: Secondary | ICD-10-CM | POA: Diagnosis not present

## 2015-02-28 DIAGNOSIS — M1990 Unspecified osteoarthritis, unspecified site: Secondary | ICD-10-CM | POA: Diagnosis present

## 2015-02-28 DIAGNOSIS — I10 Essential (primary) hypertension: Secondary | ICD-10-CM | POA: Diagnosis not present

## 2015-02-28 DIAGNOSIS — I361 Nonrheumatic tricuspid (valve) insufficiency: Secondary | ICD-10-CM | POA: Diagnosis not present

## 2015-02-28 DIAGNOSIS — Z7982 Long term (current) use of aspirin: Secondary | ICD-10-CM | POA: Diagnosis not present

## 2015-02-28 DIAGNOSIS — Z8249 Family history of ischemic heart disease and other diseases of the circulatory system: Secondary | ICD-10-CM

## 2015-02-28 DIAGNOSIS — R42 Dizziness and giddiness: Secondary | ICD-10-CM

## 2015-02-28 DIAGNOSIS — E782 Mixed hyperlipidemia: Secondary | ICD-10-CM | POA: Diagnosis present

## 2015-02-28 LAB — COMPREHENSIVE METABOLIC PANEL
ALBUMIN: 3.9 g/dL (ref 3.5–5.0)
ALT: 10 U/L — AB (ref 17–63)
AST: 16 U/L (ref 15–41)
Alkaline Phosphatase: 50 U/L (ref 38–126)
Anion gap: 4 — ABNORMAL LOW (ref 5–15)
BUN: 24 mg/dL — AB (ref 6–20)
CHLORIDE: 113 mmol/L — AB (ref 101–111)
CO2: 24 mmol/L (ref 22–32)
CREATININE: 1.13 mg/dL (ref 0.61–1.24)
Calcium: 8.8 mg/dL — ABNORMAL LOW (ref 8.9–10.3)
GFR calc Af Amer: >60 mL/min (ref >60)
GFR calc non Af Amer: >60 mL/min (ref >60)
GLUCOSE: 125 mg/dL — AB (ref 65–99)
POTASSIUM: 4.3 mmol/L (ref 3.5–5.1)
Sodium: 141 mmol/L (ref 135–145)
Total Bilirubin: 0.6 mg/dL (ref 0.3–1.2)
Total Protein: 6.4 g/dL — ABNORMAL LOW (ref 6.5–8.1)

## 2015-02-28 LAB — I-STAT TROPONIN, ED: TROPONIN I, POC: 0 ng/mL (ref 0.00–0.08)

## 2015-02-28 LAB — URINALYSIS, ROUTINE W REFLEX MICROSCOPIC
Bilirubin Urine: NEGATIVE
Glucose, UA: NEGATIVE mg/dL
Hgb urine dipstick: NEGATIVE
Ketones, ur: NEGATIVE mg/dL
LEUKOCYTES UA: NEGATIVE
NITRITE: NEGATIVE
PH: 7.5 (ref 5.0–8.0)
PROTEIN: NEGATIVE mg/dL
SPECIFIC GRAVITY, URINE: 1.015 (ref 1.005–1.030)
UROBILINOGEN UA: 0.2 mg/dL (ref 0.0–1.0)

## 2015-02-28 LAB — I-STAT CHEM 8, ED
BUN: 22 mg/dL — ABNORMAL HIGH (ref 6–20)
CREATININE: 1.1 mg/dL (ref 0.61–1.24)
Calcium, Ion: 1.28 mmol/L — ABNORMAL HIGH (ref 1.12–1.23)
Chloride: 108 mmol/L (ref 101–111)
GLUCOSE: 121 mg/dL — AB (ref 65–99)
HCT: 39 % (ref 39.0–52.0)
Hemoglobin: 13.3 g/dL (ref 13.0–17.0)
POTASSIUM: 4.3 mmol/L (ref 3.5–5.1)
Sodium: 143 mmol/L (ref 135–145)
TCO2: 21 mmol/L (ref 0–100)

## 2015-02-28 LAB — DIFFERENTIAL
BASOS PCT: 0 %
Basophils Absolute: 0 10*3/uL (ref 0.0–0.1)
Eosinophils Absolute: 0.3 10*3/uL (ref 0.0–0.7)
Eosinophils Relative: 6 %
Lymphocytes Relative: 25 %
Lymphs Abs: 1.2 10*3/uL (ref 0.7–4.0)
MONOS PCT: 10 %
Monocytes Absolute: 0.5 10*3/uL (ref 0.1–1.0)
NEUTROS ABS: 2.8 10*3/uL (ref 1.7–7.7)
NEUTROS PCT: 59 %

## 2015-02-28 LAB — CBC
HEMATOCRIT: 39.1 % (ref 39.0–52.0)
HEMOGLOBIN: 13.6 g/dL (ref 13.0–17.0)
MCH: 33.3 pg (ref 26.0–34.0)
MCHC: 34.8 g/dL (ref 30.0–36.0)
MCV: 95.8 fL (ref 78.0–100.0)
Platelets: 147 10*3/uL — ABNORMAL LOW (ref 150–400)
RBC: 4.08 MIL/uL — ABNORMAL LOW (ref 4.22–5.81)
RDW: 12.8 % (ref 11.5–15.5)
WBC: 4.7 10*3/uL (ref 4.0–10.5)

## 2015-02-28 LAB — GLUCOSE, CAPILLARY
GLUCOSE-CAPILLARY: 113 mg/dL — AB (ref 65–99)
Glucose-Capillary: 118 mg/dL — ABNORMAL HIGH (ref 65–99)

## 2015-02-28 LAB — PROTIME-INR
INR: 1.1 (ref 0.00–1.49)
Prothrombin Time: 14.4 seconds (ref 11.6–15.2)

## 2015-02-28 LAB — ETHANOL

## 2015-02-28 LAB — APTT: APTT: 27 s (ref 24–37)

## 2015-02-28 MED ORDER — ONDANSETRON HCL 4 MG/2ML IJ SOLN
4.0000 mg | Freq: Four times a day (QID) | INTRAMUSCULAR | Status: DC | PRN
  Administered 2015-02-28 – 2015-03-01 (×2): 4 mg via INTRAVENOUS
  Filled 2015-02-28 (×2): qty 2

## 2015-02-28 MED ORDER — FOLIC ACID 1 MG PO TABS
1.0000 mg | ORAL_TABLET | Freq: Every day | ORAL | Status: DC
  Administered 2015-02-28 – 2015-03-03 (×4): 1 mg via ORAL
  Filled 2015-02-28 (×4): qty 1

## 2015-02-28 MED ORDER — ONDANSETRON HCL 4 MG/2ML IJ SOLN
4.0000 mg | Freq: Once | INTRAMUSCULAR | Status: AC
  Administered 2015-02-28: 4 mg via INTRAVENOUS

## 2015-02-28 MED ORDER — ONDANSETRON HCL 4 MG/2ML IJ SOLN
4.0000 mg | Freq: Once | INTRAMUSCULAR | Status: AC
Start: 2015-02-28 — End: 2015-02-28
  Administered 2015-02-28: 4 mg via INTRAMUSCULAR

## 2015-02-28 MED ORDER — INSULIN ASPART 100 UNIT/ML ~~LOC~~ SOLN
0.0000 [IU] | Freq: Three times a day (TID) | SUBCUTANEOUS | Status: DC
  Administered 2015-03-01: 1 [IU] via SUBCUTANEOUS
  Administered 2015-03-01 – 2015-03-02 (×3): 2 [IU] via SUBCUTANEOUS
  Administered 2015-03-03: 1 [IU] via SUBCUTANEOUS

## 2015-02-28 MED ORDER — ASPIRIN 325 MG PO TABS
325.0000 mg | ORAL_TABLET | Freq: Every day | ORAL | Status: DC
Start: 2015-03-01 — End: 2015-03-03
  Administered 2015-03-01 – 2015-03-03 (×3): 325 mg via ORAL
  Filled 2015-02-28 (×3): qty 1

## 2015-02-28 MED ORDER — ENOXAPARIN SODIUM 40 MG/0.4ML ~~LOC~~ SOLN
40.0000 mg | SUBCUTANEOUS | Status: DC
  Administered 2015-02-28 – 2015-03-02 (×3): 40 mg via SUBCUTANEOUS
  Filled 2015-02-28 (×3): qty 0.4

## 2015-02-28 MED ORDER — PNEUMOCOCCAL VAC POLYVALENT 25 MCG/0.5ML IJ INJ
0.5000 mL | INJECTION | INTRAMUSCULAR | Status: AC
  Administered 2015-03-01: 0.5 mL via INTRAMUSCULAR
  Filled 2015-02-28: qty 0.5

## 2015-02-28 MED ORDER — STROKE: EARLY STAGES OF RECOVERY BOOK
Freq: Once | Status: AC
  Administered 2015-02-28: 15:00:00
  Filled 2015-02-28: qty 1

## 2015-02-28 MED ORDER — PRAVASTATIN SODIUM 10 MG PO TABS
20.0000 mg | ORAL_TABLET | Freq: Every day | ORAL | Status: DC
  Administered 2015-02-28 – 2015-03-03 (×4): 20 mg via ORAL
  Filled 2015-02-28 (×4): qty 2

## 2015-02-28 MED ORDER — ONDANSETRON HCL 4 MG/2ML IJ SOLN
INTRAMUSCULAR | Status: AC
  Filled 2015-02-28: qty 2

## 2015-02-28 MED ORDER — IOHEXOL 350 MG/ML SOLN
75.0000 mL | Freq: Once | INTRAVENOUS | Status: AC | PRN
  Administered 2015-02-28: 75 mL via INTRAVENOUS

## 2015-02-28 MED ORDER — CLOPIDOGREL BISULFATE 75 MG PO TABS
75.0000 mg | ORAL_TABLET | Freq: Every day | ORAL | Status: DC
  Administered 2015-03-01 – 2015-03-03 (×3): 75 mg via ORAL
  Filled 2015-02-28 (×3): qty 1

## 2015-02-28 MED ORDER — PREGABALIN 75 MG PO CAPS
75.0000 mg | ORAL_CAPSULE | Freq: Three times a day (TID) | ORAL | Status: DC
  Administered 2015-02-28 – 2015-03-03 (×10): 75 mg via ORAL
  Filled 2015-02-28 (×11): qty 1

## 2015-02-28 MED ORDER — ONDANSETRON HCL 4 MG/2ML IJ SOLN
INTRAMUSCULAR | Status: AC
  Administered 2015-02-28: 4 mg via INTRAVENOUS
  Filled 2015-02-28: qty 2

## 2015-02-28 MED ORDER — TOPIRAMATE 25 MG PO TABS
50.0000 mg | ORAL_TABLET | Freq: Two times a day (BID) | ORAL | Status: DC
  Administered 2015-02-28 – 2015-03-03 (×6): 50 mg via ORAL
  Filled 2015-02-28 (×10): qty 2

## 2015-02-28 MED ORDER — ASPIRIN 81 MG PO CHEW
324.0000 mg | CHEWABLE_TABLET | Freq: Once | ORAL | Status: AC
  Administered 2015-02-28: 324 mg via ORAL
  Filled 2015-02-28: qty 4

## 2015-02-28 MED ORDER — ASPIRIN 300 MG RE SUPP
300.0000 mg | Freq: Every day | RECTAL | Status: DC
  Filled 2015-02-28 (×5): qty 1

## 2015-02-28 NOTE — H&P (Signed)
History and Physical  Travis [name] WNI:[id number] DOB: [birth year] DOA: 02/28/2015  Referring physician: Ezequiel Essex, MD PCP: [name], MD   Chief Complaint: Acute CVA  HPI:  54 yom PMH of HLD, HTN, and DM presented with right sided numbness, weakness, slurred speech, and a HA. While in the ED, head CT and labs were unremarkable. CTA of the head and neck revealed decreased caliber and increased prominence of collaterals within the posterior left MCA distribution, concerning for a developing infarct in this area versus chronic stenosis as well as a remote lacunar infarct of the left caudate head. Patient was seen by teleneurology and it was agreed upon that he was not a candidate for tPA given he was last seen well at 3am (about 5 hours prior to his arrival to the ED). EDP discussed with radiology and with Dr. Doy Mince neurology and patient was not felt to be a candidate for intervention and transfer to [facility] was not recommended. He was admitted for further management and stroke workup.   Patient hospitalized 10/2014 for dizziness with negative MRI, concern was raised for complex partial seizures. He has been followed by Dr. Erlinda Hong as an outpatient for vertigo spells and treated with topamax for headache prevention, ASA 81mg  and pravastatin.   Patient reports he woke up this morning and felt dizzy upon getting out of bed. He then attempted to ambulate but felt very unsteady and started vomiting. Per wife, he was stumbling and could not stand straight. Patient complains of tingling to his right side and a headache but denies any numbness, weakness, fever, sore throat, rash, diarrhea, or abdominal pain.   Patient takes 81mg  ASA daily.   In the emergency department VSS, afebrile, not hypoxic Pertinent labs: CBC unremarkable, BMP and LFTs unremarkable, troponin negative EKG: Independently reviewed. SR, no acute changes Imaging:  MRI of the brain : IMPRESSION: Small acute right cerebellar  infarcts, most affecting the right SCA territory. No hemorrhage or mass effect. Underlying chronic bilateral cerebellar infarcts, and small bilateral MCA infarcts.   CTA head and neck: IMPRESSION: 1. Decreased caliber and increased prominence of collaterals within the posterior left MCA distribution raise concern for a developing infarct in this area versus chronic stenosis. 2. No significant proximal stenosis, aneurysm, or branch vessel occlusion. 3. The cervical vasculature is within normal limits. 4. Remote lacunar infarct of the left caudate head. 5. Cervical spondylosis as described.  CT head: Unremarkable  Review of Systems:  Positive for dizziness, headache, nausea, and vomiting.  Negative for fever, visual changes, sore throat, rash, new muscle aches, chest pain, SOB, dysuria, bleeding,  abdominal pain.  Past Medical History  Diagnosis Date  . Night sweats     every once in a while  . Hyperlipidemia   . Diabetes mellitus   . Hernia, inguinal   . Dizziness   . Chronic headaches   . Arthritis   . Neuropathy of lower extremity   . Diabetic peripheral neuropathy ([place]) 03/28/2013  . Diverticulosis   . Hypertension   . Complication of anesthesia     bleed after last shoulder-had to stay overnight  . Impingement syndrome of left shoulder 08/02/2014  . 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 Multi-infarct state ([place]) 10/14/2014  . Depression   . Anxiety     Past Surgical History  Procedure Laterality Date  . Inguinal hernia repair Bilateral   . Cervical disc surgery    . Wrist surgery Right     fusion  .  Shoulder arthroscopy Right     1  . Knee arthroscopy Right   . Colon surgery  2003    1/3 removed for diverticulitis  . Colonoscopy    . Umbilical hernia repair      with other hernia repair with mesh  . Shoulder arthroscopy Left 08/02/2014    Procedure: LEFT SHOULDER SCOPE DEBRIDEMENT/ACROMIOPLASTY;  Surgeon: [name], MD;  Location: [place];  Service: Orthopedics;  Laterality: Left;  ANESTHESIA: GENERAL, PRE/POST OP SCALENE    Social History:  reports that he quit smoking about 36 years ago. His smoking use included Cigarettes. He started smoking about 44 years ago. He has a 8 pack-year smoking history. He quit smokeless tobacco use about 36 years ago. His smokeless tobacco use included Chew. He reports that he drinks about 0.6 oz of alcohol per week. He reports that he does not use illicit drugs. lives with their spouse Self-care  No Known Allergies  Family History  Problem Relation Age of Onset  . Stroke Father   . Hyperlipidemia Father   . Diabetes Maternal Grandfather   . Heart attack Sister 27  . ALS Brother 93  . Dementia Mother      Prior to Admission medications   Medication Sig Start Date End Date Taking? Authorizing Provider  aspirin 81 MG tablet Take 2 tablets (162 mg total) by mouth daily. 10/16/14  Yes Kathie Dike, MD  baclofen (LIORESAL) 10 MG tablet Take 1 tablet (10 mg total) by mouth 3 (three) times daily. As needed for muscle spasm 08/02/14  Yes [name], MD  Cholecalciferol (VITAMIN D) 400 UNITS capsule Take 400 Units by mouth daily.     Yes Historical Provider, MD  Cyanocobalamin (VITAMIN B12 PO) Take by mouth daily.     Yes Historical Provider, MD  glyBURIDE micronized (GLYNASE) 6 MG tablet Take 1 tablet (6 mg total) by mouth 2 (two) times daily. 06/13/14  Yes Kathyrn Drown, MD  lisinopril (PRINIVIL,ZESTRIL) 2.5 MG tablet TAKE 1 TABLET BY MOUTH EVERY MORNING. 02/20/15  Yes Kathyrn Drown, MD  meclizine (ANTIVERT) 25 MG tablet Take 1 tablet (25 mg total) by mouth 3 (three) times daily as needed for dizziness. 12/02/14  Yes Kathyrn Drown, MD  meloxicam (MOBIC) 15 MG tablet Take 15 mg by mouth daily.   Yes Historical Provider, MD  metFORMIN (GLUCOPHAGE) 500 MG tablet Take 2 tablets (1,000 mg total) by mouth 2 (two) times daily with a meal. 10/18/14  Yes Kathyrn Drown, MD  pravastatin  (PRAVACHOL) 20 MG tablet TAKE 1 TABLET BY MOUTH EVERY MORNING. 02/20/15  Yes Kathyrn Drown, MD  pregabalin (LYRICA) 100 MG capsule Take 1 capsule (100 mg total) by mouth 3 (three) times daily. 12/27/14  Yes Kathyrn Drown, MD  topiramate (TOPAMAX) 25 MG tablet Take 2 tablets (50 mg total) by mouth 2 (two) times daily. 12/27/14  Yes Rosalin Hawking, MD  traMADol (ULTRAM) 50 MG tablet Take 50 mg by mouth 4 (four) times daily.  01/09/15  Yes Historical Provider, MD  valACYclovir (VALTREX) 1000 MG tablet Take 1,000 mg by mouth daily.  12/26/14  Yes Historical Provider, MD   Physical Exam: Filed Vitals:   02/28/15 0933 02/28/15 1000 02/28/15 1030 02/28/15 1045  BP:  140/86 137/90   Pulse:  52 64 64  Temp: 97.7 F (36.5 C)     TempSrc:      Resp:  13 15 15   Height:  Weight:      SpO2:  96% 96% 96%    VSS, afebrile, not hypoxic General:  Appears calm and comfortable Eyes: PERRL, normal lids, irises  ENT: grossly normal hearing, lips & tongue Neck: no LAD, masses or thyromegaly Cardiovascular: RRR, no m/r/g. No LE edema. Respiratory: CTA bilaterally, no w/r/r. Normal respiratory effort. Abdomen: soft, ntnd Skin: no rash or induration seen  Musculoskeletal: grossly normal tone BUE/BLE Psychiatric: grossly normal mood and affect, speech fluent and slightly dysarthric. Oriented to self, location, month, and year.  Neurologic: CN 2-12 intact,  strength BLE 5/5 symmetric. Left 4/5, right 5/5. Pass-pointing in the RUE, indeterminate pronator drift    Wt Readings from Last 3 Encounters:  02/28/15 83.915 kg (185 lb)  02/28/15 83.915 kg (185 lb)  02/13/15 83.915 kg (185 lb)    Labs on Admission:  Basic Metabolic Panel:  Recent Labs Lab 02/28/15 0755 02/28/15 0807  NA 141 143  K 4.3 4.3  CL 113* 108  CO2 24  --   GLUCOSE 125* 121*  BUN 24* 22*  CREATININE 1.13 1.10  CALCIUM 8.8*  --     Liver Function Tests:  Recent Labs Lab 02/28/15 0755  AST 16  ALT 10*  ALKPHOS 50   BILITOT 0.6  PROT 6.4*  ALBUMIN 3.9     CBC:  Recent Labs Lab 02/28/15 0755 02/28/15 0807  WBC 4.7  --   NEUTROABS 2.8  --   HGB 13.6 13.3  HCT 39.1 39.0  MCV 95.8  --   PLT 147*  --      Troponin (Point of Care Test)  Recent Labs  02/28/15 0804  TROPIPOC 0.00      Radiological Exams on Admission: Ct Angio Head W/cm &/or Wo Cm  02/28/2015  CLINICAL DATA:  Slurred speech, weakness, double vision and unable to walk. EXAM: CT ANGIOGRAPHY HEAD AND NECK TECHNIQUE: Multidetector CT imaging of the head and neck was performed using the standard protocol during bolus administration of intravenous contrast. Multiplanar CT image reconstructions and MIPs were obtained to evaluate the vascular anatomy. Carotid stenosis measurements (when applicable) are obtained utilizing NASCET criteria, using the distal internal carotid diameter as the denominator. CONTRAST:  17mL OMNIPAQUE IOHEXOL 350 MG/ML SOLN COMPARISON:  None. CT head without contrast from the same day. FINDINGS: CT HEAD Brain: Source images demonstrate the remote left caudate head. The insular ribbon and basal ganglia are otherwise appear to be intact. No definite cortical infarct is present. The ventricles are of normal size. Ex vacuo dilation of the left frontal horn is noted. No significant extra-axial fluid collection is present. Calvarium and skull base: Intact Paranasal sinuses: Minimal mucosal thickening is present along the medial left maxillary sinus. The sinuses are otherwise clear. Orbits: Within normal limits CTA NECK Aortic arch: A 3 vessel arch configuration is present. No significant atherosclerotic disease or stenosis is present. Right carotid system: The right common carotid artery is within normal limits. The bifurcation is unremarkable. The cervical right ICA is normal. Left carotid system: The left common carotid artery is within normal limits. The bifurcation is unremarkable. The cervical left ICA is normal. Vertebral  arteries:Both vertebral arteries originate from the subclavian arteries. The right vertebral artery is the dominant vessel. There are no significant stenoses at the vertebral artery origins. The vertebral arteries are within normal limits throughout the neck. Skeleton: Anterior cervical fusion is evident at C5-6. Chronic endplate changes are present at C3-4 and C4-5 with osseous foraminal narrowing bilaterally at  these levels. No focal lytic or blastic lesions are present. Other neck: No focal mucosal or submucosal lesions are present. The thyroid is within normal limits. There is no significant cervical adenopathy. The salivary glands are unremarkable. The lung apices are clear. The visualized mediastinum is unremarkable. CTA HEAD Anterior circulation: The internal carotid arteries are within normal limits through the skullbase to the ICA termini bilaterally. The A1 and M1 segments are normal. Anterior communicating artery is patent. The MCA bifurcations are intact. MCA branch vessels are less robust on the left, particularly posteriorly and inferiorly compared to the right. Collaterals are somewhat increased on the left. Posterior circulation: The right vertebral artery is the dominant vessel. The left PICA origin is visualized and normal. The right AICA is dominant. The basilar artery is within normal limits. Both posterior cerebral arteries originate from the basilar tip. The PCA branch vessels are intact. Venous sinuses: The dural sinuses are patent. The straight sinus and deep cerebral veins are intact. Cortical veins are unremarkable. Anatomic variants: None. Delayed phase: The postcontrast images demonstrate no pathologic enhancement. IMPRESSION: 1. Decreased caliber and increased prominence of collaterals within the posterior left MCA distribution raise concern for a developing infarct in this area versus chronic stenosis. 2. No significant proximal stenosis, aneurysm, or branch vessel occlusion. 3. The  cervical vasculature is within normal limits. 4. Remote lacunar infarct of the left caudate head. 5. Cervical spondylosis as described. Electronically Signed   By: San Morelle M.D.   On: 02/28/2015 10:15   Ct Head Wo Contrast  02/28/2015  CLINICAL DATA:  Slurred speech. EXAM: CT HEAD WITHOUT CONTRAST TECHNIQUE: Contiguous axial images were obtained from the base of the skull through the vertex without intravenous contrast. COMPARISON:  CT scan of October 14, 2014. FINDINGS: Bony calvarium appears intact. Old infarction involving the left caudate nucleus is again noted. Stable old infarctions involving both cerebellar hemispheres are noted as well. No mass effect or midline shift is noted. Ventricular size is within normal limits. There is no evidence of mass lesion, hemorrhage or acute infarction. IMPRESSION: Stable old left caudate nucleus and bilateral cerebellar infarctions. No acute intracranial abnormality seen. These results were called by telephone at the time of interpretation on 02/28/2015 at 8:06 am to Dr. Ezequiel Essex , who verbally acknowledged these results. Electronically Signed   By: Marijo Conception, M.D.   On: 02/28/2015 08:06   Ct Angio Neck W/cm &/or Wo/cm  02/28/2015  CLINICAL DATA:  Slurred speech, weakness, double vision and unable to walk. EXAM: CT ANGIOGRAPHY HEAD AND NECK TECHNIQUE: Multidetector CT imaging of the head and neck was performed using the standard protocol during bolus administration of intravenous contrast. Multiplanar CT image reconstructions and MIPs were obtained to evaluate the vascular anatomy. Carotid stenosis measurements (when applicable) are obtained utilizing NASCET criteria, using the distal internal carotid diameter as the denominator. CONTRAST:  44mL OMNIPAQUE IOHEXOL 350 MG/ML SOLN COMPARISON:  None. CT head without contrast from the same day. FINDINGS: CT HEAD Brain: Source images demonstrate the remote left caudate head. The insular ribbon and  basal ganglia are otherwise appear to be intact. No definite cortical infarct is present. The ventricles are of normal size. Ex vacuo dilation of the left frontal horn is noted. No significant extra-axial fluid collection is present. Calvarium and skull base: Intact Paranasal sinuses: Minimal mucosal thickening is present along the medial left maxillary sinus. The sinuses are otherwise clear. Orbits: Within normal limits CTA NECK Aortic arch: A 3 vessel  arch configuration is present. No significant atherosclerotic disease or stenosis is present. Right carotid system: The right common carotid artery is within normal limits. The bifurcation is unremarkable. The cervical right ICA is normal. Left carotid system: The left common carotid artery is within normal limits. The bifurcation is unremarkable. The cervical left ICA is normal. Vertebral arteries:Both vertebral arteries originate from the subclavian arteries. The right vertebral artery is the dominant vessel. There are no significant stenoses at the vertebral artery origins. The vertebral arteries are within normal limits throughout the neck. Skeleton: Anterior cervical fusion is evident at C5-6. Chronic endplate changes are present at C3-4 and C4-5 with osseous foraminal narrowing bilaterally at these levels. No focal lytic or blastic lesions are present. Other neck: No focal mucosal or submucosal lesions are present. The thyroid is within normal limits. There is no significant cervical adenopathy. The salivary glands are unremarkable. The lung apices are clear. The visualized mediastinum is unremarkable. CTA HEAD Anterior circulation: The internal carotid arteries are within normal limits through the skullbase to the ICA termini bilaterally. The A1 and M1 segments are normal. Anterior communicating artery is patent. The MCA bifurcations are intact. MCA branch vessels are less robust on the left, particularly posteriorly and inferiorly compared to the right.  Collaterals are somewhat increased on the left. Posterior circulation: The right vertebral artery is the dominant vessel. The left PICA origin is visualized and normal. The right AICA is dominant. The basilar artery is within normal limits. Both posterior cerebral arteries originate from the basilar tip. The PCA branch vessels are intact. Venous sinuses: The dural sinuses are patent. The straight sinus and deep cerebral veins are intact. Cortical veins are unremarkable. Anatomic variants: None. Delayed phase: The postcontrast images demonstrate no pathologic enhancement. IMPRESSION: 1. Decreased caliber and increased prominence of collaterals within the posterior left MCA distribution raise concern for a developing infarct in this area versus chronic stenosis. 2. No significant proximal stenosis, aneurysm, or branch vessel occlusion. 3. The cervical vasculature is within normal limits. 4. Remote lacunar infarct of the left caudate head. 5. Cervical spondylosis as described. Electronically Signed   By: San Morelle M.D.   On: 02/28/2015 10:15   Mr Brain Wo Contrast  02/28/2015  CLINICAL DATA:  54 year old male with severe dizziness, spot slurred speech, double vision, ataxia. Abnormal left MCA vasculature on CTA head and neck this morning. Initial encounter. EXAM: MRI HEAD WITHOUT CONTRAST TECHNIQUE: Multiplanar, multiecho pulse sequences of the brain and surrounding structures were obtained without intravenous contrast. COMPARISON:  CTA head and neck 0914 hours today. Brain MRI 10/14/2014. FINDINGS: Major intracranial vascular flow voids are stable compared to the prior MRI. 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 There is a 15 mm focus of confluent restricted diffusion in the right superior cerebellum just to the right of midline (series 100, image 16 and series 101, image 26. Associated mild T2 and FLAIR hyperintensity. No associated hemorrhage or mass effect. There is a  smaller, subtle focus of restricted diffusion in the central right cerebellar hemisphere (series 100, image 12). There are underlying bilateral chronic cerebellar lacunar infarcts, greater on the left. No brainstem or other restricted diffusion. Chronic left caudate and right anterior MCA division infarcts, the latter with mild cortical encephalomalacia. Stable supratentorial gray and white matter signal. No midline shift, mass effect, evidence of mass lesion, ventriculomegaly, extra-axial collection or acute intracranial hemorrhage. Cervicomedullary junction and pituitary are within normal limits. Negative visualized cervical spine. Visible internal auditory structures  appear normal. Mastoids are clear. Stable mild paranasal sinus mucosal thickening. Negative orbit and scalp soft tissues. Normal bone marrow signal. IMPRESSION: 1. Small acute right cerebellar infarcts, most affecting the right SCA territory. No hemorrhage or mass effect. 2. Underlying chronic bilateral cerebellar infarcts, and small bilateral MCA infarcts. Electronically Signed   By: Genevie Ann M.D.   On: 02/28/2015 12:01      Principal Problem:   Cerebellar stroke [facility]) Active Problems:   Essential hypertension   HLD (hyperlipidemia)   Assessment/Plan 1. Small acute right cerebellar infarcts with RUE pass pointing and dysarthria.  Neurology, PT, OT, and ST consulted. Will order echo. 2. DM type 2, stable. Continue SSI and monitor 3. Essential HTN, stable.  4. HLD, continue statin.    Admit to SDU. Complete stroke evaluation.  Follow up echo  Continue ASA and low-dose statin.    Follow up neurology, PT, OT, and ST recommendations.   Code Status: Full  DVT prophylaxis:Lovenox Family Communication: Wife at bedside. Discussed with patient who understands and has no concerns at this time. Disposition Plan/Anticipated LOS: Admit to SDU  Time spent: 33 minutes  Murray Hodgkins, MD  [facility] Pager  [phone] 02/28/2015, 12:13 PM    By signing my name below, I, Rosalie Doctor attest that this documentation has been prepared under the direction and in the presence of Murray Hodgkins, MD Electronically signed: Rosalie Doctor, Scribe. 02/28/2015 1:01pm   I personally performed the services described in this documentation. All medical record entries made by the scribe were at my direction. I have reviewed the chart and agree that the record reflects my personal performance and is accurate and complete. Murray Hodgkins, MD

## 2015-02-28 NOTE — ED Provider Notes (Signed)
CSN: [id number]     Arrival date & time 02/28/15  3382 History   First MD Initiated Contact with Patient 02/28/15 [phone]     Chief Complaint  Patient presents with  . Code Stroke     (Consider location/radiation/quality/duration/timing/severity/associated sxs/prior Treatment) HPI Comments: Level 5 caveat for acute condition. Patient brought in by EMS with right-sided numbness, weakness and slurred speech. This was noticed around 7:30 AM. He was last normal at 3 AM when he went to let his dog out. Patient is uncertain when the symptoms started. 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 Patient complains of a headache. He is a diabetic with history of hyperlipidemia. He had 2 episodes of nausea and vomiting this morning as well after his symptoms started. Code stroke activated by EMS.  EMS was told LSN was 630 am but patient states the last time he was normal was 3 am.  The history is provided by the patient and the EMS personnel. The history is limited by the condition of the patient.    Past Medical History  Diagnosis Date  . Night sweats     every once in a while  . Hyperlipidemia   . Diabetes mellitus   . Hernia, inguinal   . Dizziness   . Chronic headaches   . Arthritis   . Neuropathy of lower extremity   . Diabetic peripheral neuropathy ([place]) 03/28/2013  . Diverticulosis   . Hypertension   . Complication of anesthesia     bleed after last shoulder-had to stay overnight  . Impingement syndrome of left shoulder 08/02/2014  . 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 Depression   . Anxiety    Past Surgical History  Procedure Laterality Date  . Inguinal hernia repair Bilateral   . Cervical disc surgery    . Wrist surgery Right     fusion  . Shoulder arthroscopy Right     1  . Knee arthroscopy Right   . Colon surgery  2003    1/3 removed for diverticulitis  . Colonoscopy    . Umbilical hernia  repair      with other hernia repair with mesh  . Shoulder arthroscopy Left 08/02/2014    Procedure: LEFT SHOULDER SCOPE DEBRIDEMENT/ACROMIOPLASTY;  Surgeon: [name], MD;  Location: [facility];  Service: Orthopedics;  Laterality: Left;  ANESTHESIA: GENERAL, PRE/POST OP SCALENE   Family History  Problem Relation Age of Onset  . Stroke Father   . Hyperlipidemia Father   . Diabetes Maternal Grandfather   . Heart attack Sister 82  . ALS Brother 76  . Dementia Mother    Social History  Substance Use Topics  . Smoking status: Former Smoker -- 1.00 packs/day for 8 years    Types: Cigarettes    Start date: 06/07/1970    Quit date: 05/03/1978  . Smokeless tobacco: Former Systems developer    Types: [place] date: 05/03/1978  . Alcohol Use: 0.6 oz/week    1 Cans of beer per week     Comment: occasional shot of taquila    Review of Systems  Unable to perform ROS: Acuity of condition      Allergies  Review of patient's allergies indicates no known allergies.  Home Medications   Prior to Admission medications   Medication Sig Start Date End Date Taking? Authorizing Provider  aspirin 81 MG tablet Take 2 tablets (162 mg total) by  mouth daily. 10/16/14  Yes Kathie Dike, MD  baclofen (LIORESAL) 10 MG tablet Take 1 tablet (10 mg total) by mouth 3 (three) times daily. As needed for muscle spasm 08/02/14  Yes [name], MD  Cholecalciferol (VITAMIN D) 400 UNITS capsule Take 400 Units by mouth daily.     Yes Historical Provider, MD  Cyanocobalamin (VITAMIN B12 PO) Take by mouth daily.     Yes Historical Provider, MD  glyBURIDE micronized (GLYNASE) 6 MG tablet Take 1 tablet (6 mg total) by mouth 2 (two) times daily. 06/13/14  Yes Kathyrn Drown, MD  lisinopril (PRINIVIL,ZESTRIL) 2.5 MG tablet TAKE 1 TABLET BY MOUTH EVERY MORNING. 02/20/15  Yes Kathyrn Drown, MD  meclizine (ANTIVERT) 25 MG tablet Take 1 tablet (25 mg total) by mouth 3 (three) times daily as needed for dizziness.  12/02/14  Yes Kathyrn Drown, MD  meloxicam (MOBIC) 15 MG tablet Take 15 mg by mouth daily.   Yes Historical Provider, MD  metFORMIN (GLUCOPHAGE) 500 MG tablet Take 2 tablets (1,000 mg total) by mouth 2 (two) times daily with a meal. 10/18/14  Yes Kathyrn Drown, MD  pravastatin (PRAVACHOL) 20 MG tablet TAKE 1 TABLET BY MOUTH EVERY MORNING. 02/20/15  Yes Kathyrn Drown, MD  pregabalin (LYRICA) 100 MG capsule Take 1 capsule (100 mg total) by mouth 3 (three) times daily. 12/27/14  Yes Kathyrn Drown, MD  topiramate (TOPAMAX) 25 MG tablet Take 2 tablets (50 mg total) by mouth 2 (two) times daily. 12/27/14  Yes Rosalin Hawking, MD  traMADol (ULTRAM) 50 MG tablet Take 50 mg by mouth 4 (four) times daily.  01/09/15  Yes Historical Provider, MD  valACYclovir (VALTREX) 1000 MG tablet Take 1,000 mg by mouth daily.  12/26/14  Yes Historical Provider, MD   BP 120/84 mmHg  Pulse 67  Temp(Src) 97 F (36.1 C) (Oral)  Resp 14  Ht 6\' 1"  (1.854 m)  Wt 184 lb 15.5 oz (83.9 kg)  BMI 24.41 kg/m2  SpO2 97% Physical Exam  Constitutional: He is oriented to person, place, and time. He appears well-developed and well-nourished. No distress.  HENT:  Head: Normocephalic and atraumatic.  Mouth/Throat: Oropharynx is clear and moist. No oropharyngeal exudate.  Eyes: Conjunctivae are normal. Pupils are equal, round, and reactive to light.  Neck: Normal range of motion. Neck supple.  Cardiovascular: Normal rate, regular rhythm and normal heart sounds.   Pulmonary/Chest: Effort normal and breath sounds normal. No respiratory distress. He exhibits no tenderness.  Abdominal: Soft. There is no tenderness. There is no rebound and no guarding.  Musculoskeletal: Normal range of motion. He exhibits no edema or tenderness.  Neurological: He is alert and oriented to person, place, and time. No cranial nerve deficit.  Slurred speech, tongue is midline, no appreciable facial droop. Pronator drift of right arm with past pointing on finger to  nose. 4/5 strength of right upper extremity 5/5 strength of bilateral lower extremities. Able to hold each leg off the bed. 5/5 strength left upper extremity.  Skin: Skin is warm.    ED Course  Procedures (including critical care time) Labs Review Labs Reviewed  CBC - Abnormal; Notable for the following:    RBC 4.08 (*)    Platelets 147 (*)    All other components within normal limits  COMPREHENSIVE METABOLIC PANEL - Abnormal; Notable for the following:    Chloride 113 (*)    Glucose, Bld 125 (*)    BUN 24 (*)  Calcium 8.8 (*)    Total Protein 6.4 (*)    ALT 10 (*)    Anion gap 4 (*)    All other components within normal limits  GLUCOSE, CAPILLARY - Abnormal; Notable for the following:    Glucose-Capillary 113 (*)    All other components within normal limits  I-STAT CHEM 8, ED - Abnormal; Notable for the following:    BUN 22 (*)    Glucose, Bld 121 (*)    Calcium, Ion 1.28 (*)    All other components within normal limits  ETHANOL  PROTIME-INR  APTT  DIFFERENTIAL  URINALYSIS, ROUTINE W REFLEX MICROSCOPIC (NOT AT [facility])  I-STAT TROPOININ, ED    Imaging Review Ct Angio Head W/cm &/or Wo Cm  02/28/2015  CLINICAL DATA:  Slurred speech, weakness, double vision and unable to walk. EXAM: CT ANGIOGRAPHY HEAD AND NECK TECHNIQUE: Multidetector CT imaging of the head and neck was performed using the standard protocol during bolus administration of intravenous contrast. Multiplanar CT image reconstructions and MIPs were obtained to evaluate the vascular anatomy. Carotid stenosis measurements (when applicable) are obtained utilizing NASCET criteria, using the distal internal carotid diameter as the denominator. CONTRAST:  44mL OMNIPAQUE IOHEXOL 350 MG/ML SOLN COMPARISON:  None. CT head without contrast from the same day. FINDINGS: CT HEAD Brain: Source images demonstrate the remote left caudate head. The insular ribbon and basal ganglia are otherwise appear to be intact. No definite  cortical infarct is present. The ventricles are of normal size. Ex vacuo dilation of the left frontal horn is noted. No significant extra-axial fluid collection is present. Calvarium and skull base: Intact Paranasal sinuses: Minimal mucosal thickening is present along the medial left maxillary sinus. The sinuses are otherwise clear. Orbits: Within normal limits CTA NECK Aortic arch: A 3 vessel arch configuration is present. No significant atherosclerotic disease or stenosis is present. Right carotid system: The right common carotid artery is within normal limits. The bifurcation is unremarkable. The cervical right ICA is normal. Left carotid system: The left common carotid artery is within normal limits. The bifurcation is unremarkable. The cervical left ICA is normal. Vertebral arteries:Both vertebral arteries originate from the subclavian arteries. The right vertebral artery is the dominant vessel. There are no significant stenoses at the vertebral artery origins. The vertebral arteries are within normal limits throughout the neck. Skeleton: Anterior cervical fusion is evident at C5-6. Chronic endplate changes are present at C3-4 and C4-5 with osseous foraminal narrowing bilaterally at these levels. No focal lytic or blastic lesions are present. Other neck: No focal mucosal or submucosal lesions are present. The thyroid is within normal limits. There is no significant cervical adenopathy. The salivary glands are unremarkable. The lung apices are clear. The visualized mediastinum is unremarkable. CTA HEAD Anterior circulation: The internal carotid arteries are within normal limits through the skullbase to the ICA termini bilaterally. The A1 and M1 segments are normal. Anterior communicating artery is patent. The MCA bifurcations are intact. MCA branch vessels are less robust on the left, particularly posteriorly and inferiorly compared to the right. Collaterals are somewhat increased on the left. Posterior  circulation: The right vertebral artery is the dominant vessel. The left PICA origin is visualized and normal. The right AICA is dominant. The basilar artery is within normal limits. Both posterior cerebral arteries originate from the basilar tip. The PCA branch vessels are intact. Venous sinuses: The dural sinuses are patent. The straight sinus and deep cerebral veins are intact. Cortical veins are unremarkable.  Anatomic variants: None. Delayed phase: The postcontrast images demonstrate no pathologic enhancement. IMPRESSION: 1. Decreased caliber and increased prominence of collaterals within the posterior left MCA distribution raise concern for a developing infarct in this area versus chronic stenosis. 2. No significant proximal stenosis, aneurysm, or branch vessel occlusion. 3. The cervical vasculature is within normal limits. 4. Remote lacunar infarct of the left caudate head. 5. Cervical spondylosis as described. Electronically Signed   By: San Morelle M.D.   On: 02/28/2015 10:15   Ct Head Wo Contrast  02/28/2015  CLINICAL DATA:  Slurred speech. EXAM: CT HEAD WITHOUT CONTRAST TECHNIQUE: Contiguous axial images were obtained from the base of the skull through the vertex without intravenous contrast. COMPARISON:  CT scan of October 14, 2014. FINDINGS: Bony calvarium appears intact. Old infarction involving the left caudate nucleus is again noted. Stable old infarctions involving both cerebellar hemispheres are noted as well. No mass effect or midline shift is noted. Ventricular size is within normal limits. There is no evidence of mass lesion, hemorrhage or acute infarction. IMPRESSION: Stable old left caudate nucleus and bilateral cerebellar infarctions. No acute intracranial abnormality seen. These results were called by telephone at the time of interpretation on 02/28/2015 at 8:06 am to Dr. Ezequiel Essex , who verbally acknowledged these results. Electronically Signed   By: Marijo Conception, M.D.    On: 02/28/2015 08:06   Ct Angio Neck W/cm &/or Wo/cm  02/28/2015  CLINICAL DATA:  Slurred speech, weakness, double vision and unable to walk. EXAM: CT ANGIOGRAPHY HEAD AND NECK TECHNIQUE: Multidetector CT imaging of the head and neck was performed using the standard protocol during bolus administration of intravenous contrast. Multiplanar CT image reconstructions and MIPs were obtained to evaluate the vascular anatomy. Carotid stenosis measurements (when applicable) are obtained utilizing NASCET criteria, using the distal internal carotid diameter as the denominator. CONTRAST:  73mL OMNIPAQUE IOHEXOL 350 MG/ML SOLN COMPARISON:  None. CT head without contrast from the same day. FINDINGS: CT HEAD Brain: Source images demonstrate the remote left caudate head. The insular ribbon and basal ganglia are otherwise appear to be intact. No definite cortical infarct is present. The ventricles are of normal size. Ex vacuo dilation of the left frontal horn is noted. No significant extra-axial fluid collection is present. Calvarium and skull base: Intact Paranasal sinuses: Minimal mucosal thickening is present along the medial left maxillary sinus. The sinuses are otherwise clear. Orbits: Within normal limits CTA NECK Aortic arch: A 3 vessel arch configuration is present. No significant atherosclerotic disease or stenosis is present. Right carotid system: The right common carotid artery is within normal limits. The bifurcation is unremarkable. The cervical right ICA is normal. Left carotid system: The left common carotid artery is within normal limits. The bifurcation is unremarkable. The cervical left ICA is normal. Vertebral arteries:Both vertebral arteries originate from the subclavian arteries. The right vertebral artery is the dominant vessel. There are no significant stenoses at the vertebral artery origins. The vertebral arteries are within normal limits throughout the neck. Skeleton: Anterior cervical fusion is  evident at C5-6. Chronic endplate changes are present at C3-4 and C4-5 with osseous foraminal narrowing bilaterally at these levels. No focal lytic or blastic lesions are present. Other neck: No focal mucosal or submucosal lesions are present. The thyroid is within normal limits. There is no significant cervical adenopathy. The salivary glands are unremarkable. The lung apices are clear. The visualized mediastinum is unremarkable. CTA HEAD Anterior circulation: The internal carotid arteries are within  normal limits through the skullbase to the ICA termini bilaterally. The A1 and M1 segments are normal. Anterior communicating artery is patent. The MCA bifurcations are intact. MCA branch vessels are less robust on the left, particularly posteriorly and inferiorly compared to the right. Collaterals are somewhat increased on the left. Posterior circulation: The right vertebral artery is the dominant vessel. The left PICA origin is visualized and normal. The right AICA is dominant. The basilar artery is within normal limits. Both posterior cerebral arteries originate from the basilar tip. The PCA branch vessels are intact. Venous sinuses: The dural sinuses are patent. The straight sinus and deep cerebral veins are intact. Cortical veins are unremarkable. Anatomic variants: None. Delayed phase: The postcontrast images demonstrate no pathologic enhancement. IMPRESSION: 1. Decreased caliber and increased prominence of collaterals within the posterior left MCA distribution raise concern for a developing infarct in this area versus chronic stenosis. 2. No significant proximal stenosis, aneurysm, or branch vessel occlusion. 3. The cervical vasculature is within normal limits. 4. Remote lacunar infarct of the left caudate head. 5. Cervical spondylosis as described. Electronically Signed   By: San Morelle M.D.   On: 02/28/2015 10:15   Mr Brain Wo Contrast  02/28/2015  CLINICAL DATA:  54 year old male with severe  dizziness, spot slurred speech, double vision, ataxia. Abnormal left MCA vasculature on CTA head and neck this morning. Initial encounter. EXAM: MRI HEAD WITHOUT CONTRAST TECHNIQUE: Multiplanar, multiecho pulse sequences of the brain and surrounding structures were obtained without intravenous contrast. COMPARISON:  CTA head and neck 0914 hours today. Brain MRI 10/14/2014. FINDINGS: Major intracranial vascular flow voids are stable compared to the prior MRI. 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 There is a 15 mm focus of confluent restricted diffusion in the right superior cerebellum just to the right of midline (series 100, image 16 and series 101, image 26. Associated mild T2 and FLAIR hyperintensity. No associated hemorrhage or mass effect. There is a smaller, subtle focus of restricted diffusion in the central right cerebellar hemisphere (series 100, image 12). There are underlying bilateral chronic cerebellar lacunar infarcts, greater on the left. No brainstem or other restricted diffusion. Chronic left caudate and right anterior MCA division infarcts, the latter with mild cortical encephalomalacia. Stable supratentorial gray and white matter signal. No midline shift, mass effect, evidence of mass lesion, ventriculomegaly, extra-axial collection or acute intracranial hemorrhage. Cervicomedullary junction and pituitary are within normal limits. Negative visualized cervical spine. Visible internal auditory structures appear normal. Mastoids are clear. Stable mild paranasal sinus mucosal thickening. Negative orbit and scalp soft tissues. Normal bone marrow signal. IMPRESSION: 1. Small acute right cerebellar infarcts, most affecting the right SCA territory. No hemorrhage or mass effect. 2. Underlying chronic bilateral cerebellar infarcts, and small bilateral MCA infarcts. Electronically Signed   By: Genevie Ann M.D.   On: 02/28/2015 12:01   I have personally reviewed and evaluated these  images and lab results as part of my medical decision-making.   EKG Interpretation   Date/Time:  Friday February 28 2015 07:57:17 EDT Ventricular Rate:  66 PR Interval:  158 QRS Duration: 105 QT Interval:  389 QTC Calculation: 407 R Axis:   53 Text Interpretation:  Sinus rhythm RSR' in V1 or V2, probably normal  variant No significant change was found Confirmed by Wyvonnia Dusky  MD, Deshawn Skelley  [phone]) on 02/28/2015 8:09:54 AM      MDM   Final diagnoses:  Dizziness  Cerebrovascular accident (CVA), unspecified mechanism ([place])   Patient  with right-sided numbness and slurred speech on arrival last seen normal about 3 AM. Code stroke activated by EMS.  CT head negative for hemorrhage. Chart review shows admission for dizziness and ataxia in June with negative neuro imaging and no signs of acute stroke. 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  Patient seen by teleneurology. They agree he is not TPA candidate due to last seen normal at 3 AM. They recommend CT A to evaluate for basilar artery occlusion given his vomiting, ataxia, and dysarthria.  D/w Dr. Deniece Ree of neurologist on call.  She recommends CT A to evaluate for basilar artery thrombosis. If this is negative she recommends admission for stroke workup  CTA d/w Dr. Jobe Igo. Patient has possible posterior infarct on CTA with stenosis of posterior MCA. There is no large vessel occlusion but there may be an occluded vessel distally. Dr. Jobe Igo does not think patient is a IR candidate. \Discussed with Dr. Doy Mince. She agrees that patient does not need to be transferred. There is no role for systemic TPA as patient presented out of window. she also does not feel there is any role for catheter directed TPA or embolectomy as per Dr. Jobe Igo as patient would not be able to be on IR table within 8 hours of symptom onset.   Admission discussed with Dr. Sarajane Jews. Patient will be admitted to stepdown unit. MRI  pending.   CRITICAL CARE Performed by: Ezequiel Essex Total critical care time: 40 minutes Critical care time was exclusive of separately billable procedures and treating other patients. Critical care was necessary to treat or prevent imminent or life-threatening deterioration. Critical care was time spent personally by me on the following activities: development of treatment plan with patient and/or surrogate as well as nursing, discussions with consultants, evaluation of patient's response to treatment, examination of patient, obtaining history from patient or surrogate, ordering and performing treatments and interventions, ordering and review of laboratory studies, ordering and review of radiographic studies, pulse oximetry and re-evaluation of patient's condition.   Ezequiel Essex, MD 02/28/15 [phone]

## 2015-02-28 NOTE — ED Notes (Signed)
Code Stroke Called at [phone] via Dr Wyvonnia Dusky, Pt taken directly to CT of head via RN and EMS.  Ct of the head completed at 0750.  Pt says LKW was about 0300 and about 0600 he woke up with slurred speech, weakness, double vision and not able to walk.  Pt arrived to [facility] at 0750 via [facility].  EMS reports that pt has history of stroke in July 2016, had n/v at about 0740 in route to ED.  Glucose 101 per  EMS, 96% on RA, NSR, and #20g jelco to left FA-saline locked.  EKG done at 0757 and given to Dr Wyvonnia Dusky.  Neurologist completed Neuro assessment at [phone] and no tPA ordered.  Dr Wyvonnia Dusky spoke with Neurologist and plan to do CT-Angio.

## 2015-02-28 NOTE — ED Notes (Signed)
MD at bedside. 

## 2015-02-28 NOTE — ED Notes (Signed)
4403 pt returned from CT. Complain of headache 5/10.  Speech slightly slurred

## 2015-02-28 NOTE — ED Notes (Signed)
Pt vomiting.

## 2015-02-28 NOTE — Consult Note (Signed)
[place] A. Merlene Laughter, MD     [email or web address]          Travis [name] is an 54 y.o. male.   ASSESSMENT/PLAN: Acute right cerebellar infarct - risk factors are dyslipidemia, hypertension, diabetes, past history of nicotine use and a previous infarct. The patient has had several infarcts which is concerning for possible cardioembolic etiology. Consequently, we will have the patient do a transesophageal echocardiography and also a 30 day event monitor. Plavix will be added to aspirin at least for the next 3 months. Subsequent to this the patient can be placed in a single antiplatelet agent depending on the workup.  The patient is a 54 year old who has had several neurological events in the past. Patient has had recurrent spells of unclear etiology with negative workup for ischemic stroke. Patient however has had documentation of previous infarct on imaging. The patient presented previously with neurological symptoms with negative workup for ischemic stroke. His aspirin was increased and he apparently has been compliant with this. The patient presented with acute onset of dysarthria, gait impairment, ataxic gait and the confusion per the wife. Imaging shows an acute cerebral infarct. No reports of chest pain, shortness of breath, headaches or visual disturbance. The wife reports that he had a couple emesis. The patient appears to be drowsy today for unclear reasons. The review of systems otherwise negative.  GENERAL: He is in no acute distress.  HEENT: Supple. Atraumatic normocephalic.   ABDOMEN: soft  EXTREMITIES: No edema   BACK: Normal.  SKIN: Normal by inspection.    MENTAL STATUS: Drowsy - but arousable with verbal commands. He does follow commands probably he does have mild to moderate dysarthria. Language is normal. Cognition is unremarkable despite being drowsy.  CRANIAL NERVES: Pupils are equal, round and reactive to light and accommodation; extra ocular  movements are full, there is no significant nystagmus; visual fields are full; there is mild ptosis on the left otherwise facial muscles are normal and symmetric, there is no flattening of the nasolabial folds; tongue is midline; uvula is midline; shoulder elevation is normal.  MOTOR: Normal tone, bulk and strength; no pronator drift.  COORDINATION: Left finger to nose is normal, left heel-to-shin normal, dysmetria noted on of the right upper extremity and right leg particularly involving the right upper extremity; No rest tremor; no intention tremor; no postural tremor; no bradykinesia.  REFLEXES: Deep tendon reflexes are symmetrical and normal. Babinski reflexes are flexor bilaterally.   SENSATION: Normal to light touch.    The patient's brain MRI is reviewed in person. There is a moderate sized increased signal on diffusion images involving the superior medial aspect of the cerebellum on the right side. There is moderate increase in white matter signal on FLAIR imaging including the right frontal area in a wedge-shaped distribution suggestive of a watershed infarct involving the ACA and MCA distributions. Decreased signal is seen on T1 indicating and encephalomalacia involving the inferior cerebellum bilaterally and there are left head of the caudate nucleus. No microhemorrhages are appreciated.   [[[[[[[[[[[[[[[[[[[[[LAST VISIT NEUROLOGY NOTE 1. Recurrent episodes of confusion, ataxia, dysarthria and dizziness associated with headaches. Etiologies includes complex partial seizures, migraine headaches and ischemic events. Given the negative imaging for acute infarcts in the recurrent nature I believe that ischemic events are unlikely. The patient has had previous infarcts and also had injuries increases risk of seizures. I think this is most likely etiology. 2. Right frontal cortical/Subcortical lesions likely related to previous head injury. This likely  serves as the substrate for the patient's  recurrent episodic events. 3. Previous lacunar infarcts. 4. Hypertension. 5. Diabetes. 6. Diabetic neuropathy.  RECOMMENDATION: EEG --- Normal but continue with low dose keppra FU in office 4 weeks Additional blood test for the following: Homocystine level, RPR, thyroid function tests and HIV. Trial of low-dose Keppra for now. - 250 bid Discontinue meloxicam. ]]]]]]]]]]]]]]]]]]]]]]]]]]  Blood pressure 123/83, pulse 66, temperature 97 F (36.1 C), temperature source Oral, resp. rate 13, height '6\' 1"'  (1.854 m), weight 83.9 kg (184 lb 15.5 oz), SpO2 96 %.  Past Medical History  Diagnosis Date  . Night sweats     every once in a while  . Hyperlipidemia   . Diabetes mellitus   . Hernia, inguinal   . Dizziness   . Chronic headaches   . Arthritis   . Neuropathy of lower extremity   . Diabetic peripheral neuropathy ([place]) 03/28/2013  . Diverticulosis   . Hypertension   . Complication of anesthesia     bleed after last shoulder-had to stay overnight  . Impingement syndrome of left shoulder 08/02/2014  . 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 Multi-infarct state ([place]) 10/14/2014  . Depression   . Anxiety     Past Surgical History  Procedure Laterality Date  . Inguinal hernia repair Bilateral   . Cervical disc surgery    . Wrist surgery Right     fusion  . Shoulder arthroscopy Right     1  . Knee arthroscopy Right   . Colon surgery  2003    1/3 removed for diverticulitis  . Colonoscopy    . Umbilical hernia repair      with other hernia repair with mesh  . Shoulder arthroscopy Left 08/02/2014    Procedure: LEFT SHOULDER SCOPE DEBRIDEMENT/ACROMIOPLASTY;  Surgeon: [name], MD;  Location: [facility];  Service: Orthopedics;  Laterality: Left;  ANESTHESIA: GENERAL, PRE/POST OP SCALENE    Family History  Problem Relation Age of Onset  . Stroke Father   . Hyperlipidemia Father   . Diabetes Maternal Grandfather   . Heart attack Sister 32  . ALS  Brother 28  . Dementia Mother     Social History:  reports that he quit smoking about 36 years ago. His smoking use included Cigarettes. He started smoking about 44 years ago. He has a 8 pack-year smoking history. He quit smokeless tobacco use about 36 years ago. His smokeless tobacco use included Chew. He reports that he drinks about 0.6 oz of alcohol per week. He reports that he does not use illicit drugs.  Allergies: No Known Allergies  Medications: Prior to Admission medications   Medication Sig Start Date End Date Taking? Authorizing Provider  aspirin 81 MG tablet Take 2 tablets (162 mg total) by mouth daily. 10/16/14  Yes Kathie Dike, MD  baclofen (LIORESAL) 10 MG tablet Take 1 tablet (10 mg total) by mouth 3 (three) times daily. As needed for muscle spasm 08/02/14  Yes [name], MD  Cholecalciferol (VITAMIN D) 400 UNITS capsule Take 400 Units by mouth daily.     Yes Historical Provider, MD  Cyanocobalamin (VITAMIN B12 PO) Take by mouth daily.     Yes Historical Provider, MD  glyBURIDE micronized (GLYNASE) 6 MG tablet Take 1 tablet (6 mg total) by mouth 2 (two) times daily. 06/13/14  Yes Kathyrn Drown, MD  lisinopril (PRINIVIL,ZESTRIL) 2.5 MG tablet TAKE 1 TABLET BY MOUTH EVERY MORNING. 02/20/15  Yes Scott A Luking,  MD  meclizine (ANTIVERT) 25 MG tablet Take 1 tablet (25 mg total) by mouth 3 (three) times daily as needed for dizziness. 12/02/14  Yes Kathyrn Drown, MD  meloxicam (MOBIC) 15 MG tablet Take 15 mg by mouth daily.   Yes Historical Provider, MD  metFORMIN (GLUCOPHAGE) 500 MG tablet Take 2 tablets (1,000 mg total) by mouth 2 (two) times daily with a meal. 10/18/14  Yes Kathyrn Drown, MD  pravastatin (PRAVACHOL) 20 MG tablet TAKE 1 TABLET BY MOUTH EVERY MORNING. 02/20/15  Yes Kathyrn Drown, MD  pregabalin (LYRICA) 100 MG capsule Take 1 capsule (100 mg total) by mouth 3 (three) times daily. 12/27/14  Yes Kathyrn Drown, MD  topiramate (TOPAMAX) 25 MG tablet Take 2 tablets (50  mg total) by mouth 2 (two) times daily. 12/27/14  Yes Rosalin Hawking, MD  traMADol (ULTRAM) 50 MG tablet Take 50 mg by mouth 4 (four) times daily.  01/09/15  Yes Historical Provider, MD  valACYclovir (VALTREX) 1000 MG tablet Take 1,000 mg by mouth daily.  12/26/14  Yes Historical Provider, MD    Scheduled Meds: . [START ON 03/01/2015] aspirin  300 mg Rectal Daily   Or  . [START ON 03/01/2015] aspirin  325 mg Oral Daily  . [START ON 03/01/2015] clopidogrel  75 mg Oral Q breakfast  . enoxaparin (LOVENOX) injection  40 mg Subcutaneous Q24H  . folic acid  1 mg Oral Daily  . insulin aspart  0-9 Units Subcutaneous TID WC  . [START ON 03/01/2015] pneumococcal 23 valent vaccine  0.5 mL Intramuscular Tomorrow-1000  . pravastatin  20 mg Oral q1800  . pregabalin  75 mg Oral TID  . topiramate  50 mg Oral BID   Continuous Infusions:  PRN Meds:.     Results for orders placed or performed during the hospital encounter of 02/28/15 (from the past 48 hour(s))  Ethanol     Status: None   Collection Time: 02/28/15  7:55 AM  Result Value Ref Range   Alcohol, Ethyl (B) <5 <5 mg/dL    Comment:        LOWEST DETECTABLE LIMIT FOR SERUM ALCOHOL IS 5 mg/dL FOR MEDICAL PURPOSES ONLY   Protime-INR     Status: None   Collection Time: 02/28/15  7:55 AM  Result Value Ref Range   Prothrombin Time 14.4 11.6 - 15.2 seconds   INR 1.10 0.00 - 1.49  APTT     Status: None   Collection Time: 02/28/15  7:55 AM  Result Value Ref Range   aPTT 27 24 - 37 seconds  CBC     Status: Abnormal   Collection Time: 02/28/15  7:55 AM  Result Value Ref Range   WBC 4.7 4.0 - 10.5 K/uL   RBC 4.08 (L) 4.22 - 5.81 MIL/uL   Hemoglobin 13.6 13.0 - 17.0 g/dL   HCT 39.1 39.0 - 52.0 %   MCV 95.8 78.0 - 100.0 fL   MCH 33.3 26.0 - 34.0 pg   MCHC 34.8 30.0 - 36.0 g/dL   RDW 12.8 11.5 - 15.5 %   Platelets 147 (L) 150 - 400 K/uL  Differential     Status: None   Collection Time: 02/28/15  7:55 AM  Result Value Ref Range   Neutrophils  Relative % 59 %   Neutro Abs 2.8 1.7 - 7.7 K/uL   Lymphocytes Relative 25 %   Lymphs Abs 1.2 0.7 - 4.0 K/uL   Monocytes Relative 10 %   Monocytes  Absolute 0.5 0.1 - 1.0 K/uL   Eosinophils Relative 6 %   Eosinophils Absolute 0.3 0.0 - 0.7 K/uL   Basophils Relative 0 %   Basophils Absolute 0.0 0.0 - 0.1 K/uL  Comprehensive metabolic panel     Status: Abnormal   Collection Time: 02/28/15  7:55 AM  Result Value Ref Range   Sodium 141 135 - 145 mmol/L   Potassium 4.3 3.5 - 5.1 mmol/L   Chloride 113 (H) 101 - 111 mmol/L   CO2 24 22 - 32 mmol/L   Glucose, Bld 125 (H) 65 - 99 mg/dL   BUN 24 (H) 6 - 20 mg/dL   Creatinine, Ser 1.13 0.61 - 1.24 mg/dL   Calcium 8.8 (L) 8.9 - 10.3 mg/dL   Total Protein 6.4 (L) 6.5 - 8.1 g/dL   Albumin 3.9 3.5 - 5.0 g/dL   AST 16 15 - 41 U/L   ALT 10 (L) 17 - 63 U/L   Alkaline Phosphatase 50 38 - 126 U/L   Total Bilirubin 0.6 0.3 - 1.2 mg/dL   GFR calc non Af Amer >60 >60 mL/min   GFR calc Af Amer >60 >60 mL/min    Comment: (NOTE) The eGFR has been calculated using the CKD EPI equation. This calculation has not been validated in all clinical situations. eGFR's persistently <60 mL/min signify possible Chronic Kidney Disease.    Anion gap 4 (L) 5 - 15  I-stat troponin, ED (not at [facility] [place])     Status: None   Collection Time: 02/28/15  8:04 AM  Result Value Ref Range   Troponin i, poc 0.00 0.00 - 0.08 ng/mL   Comment 3            Comment: Due to the release kinetics of cTnI, a negative result within the first hours of the onset of symptoms does not rule out myocardial infarction with certainty. If myocardial infarction is still suspected, repeat the test at appropriate intervals.   I-Stat Chem 8, ED  (not at [facility])     Status: Abnormal   Collection Time: 02/28/15  8:07 AM  Result Value Ref Range   Sodium 143 135 - 145 mmol/L   Potassium 4.3 3.5 - 5.1 mmol/L   Chloride 108 101 - 111 mmol/L   BUN 22 (H) 6 - 20 mg/dL   Creatinine, Ser 1.10  0.61 - 1.24 mg/dL   Glucose, Bld 121 (H) 65 - 99 mg/dL   Calcium, Ion 1.28 (H) 1.12 - 1.23 mmol/L   TCO2 21 0 - 100 mmol/L   Hemoglobin 13.3 13.0 - 17.0 g/dL   HCT 39.0 39.0 - 52.0 %  Glucose, capillary     Status: Abnormal   Collection Time: 02/28/15  3:49 PM  Result Value Ref Range   Glucose-Capillary 113 (H) 65 - 99 mg/dL    Studies/Results:     Azaylea Maves A. Merlene Laughter, M.D.  Diplomate, Tax adviser of Psychiatry and Neurology ( Neurology). 02/28/2015, 4:55 PM

## 2015-02-28 NOTE — Telephone Encounter (Signed)
Talked with wife tammy over the phone. Pt is in [facility] for right SCA small infarct. CTA head and neck unremarkable. Physicians in [place] is going to do TEE to look for cardiac SOE. I agree with that. LDL and A1C not revealing. He is on dual antiplatelet and statin. No need to transfer to [facility]. Stroke work up almost complete. He needs PT OT and rehab. Wife expressed understanding and appreciation.  Rosalin Hawking, MD PhD Stroke Neurology 02/28/2015 6:40 PM

## 2015-02-28 NOTE — Progress Notes (Signed)
7:37 possible code stroke approx 5 minutes out 7:40 RN Darnelle Bos, RN called, stroke almost here 7:42 Daphne, RN called and Tiffany, RN called, almost here 7:46 RN and patient and EMS in [place] room  7:49 Beeper for Code Stroke 7:50 Patient, RN and EMS back out to ER; completed study in EPIC and called Tonya at [facility]. 8:06 Rad called report to EDP/ report is finalized in [place] and PACS

## 2015-02-28 NOTE — Progress Notes (Signed)
Found patient trying to get out of bed to urinate.  Patient stated for nursing staff and wife to "get out" of room while he urinated.  Encouraged patient to allow staff to assist him, but he adamantly refused.  Informed patient of the risk of falling due to unsteady gait.  Patient stated "i don't care just get out".  Nursing staff and wife exited room as requested.

## 2015-02-28 NOTE — Telephone Encounter (Signed)
Pt's wife called and states that he has been admitted to [facility] for a stroke. Was told that it has been confirmed that it was a stroke. The ER physician has given the wife a choice to send the pt to [facility] or to keep him at [facility]. Wife has decided to transfer pt to [facility] hospital and wanted this office to know , that way Dr. Erlinda Hong can look in on him. May call wife back at [phone]

## 2015-02-28 NOTE — ED Notes (Signed)
Code stroke called in field at [place]. EDP evaluated pt on arrival to ED and pt sent straight to CT Pt states he got up at 0300 to let his dog out an he was fine. States he got up again at 0700 and he had right sided weakness and numbness. States he had a previous stroke in July but did not have the right sided weakness.

## 2015-02-28 NOTE — Progress Notes (Signed)
Called staff to room stating he needed to urinate.  Patient adamantly refusing for staff or family to be in the room for assistance. Patient with unsteady gait and uncoordinated movements at this time.  Patient refusing to use urinal and threw it across the room.  Patient laid back down and went to sleep.  Wife returned to room and stated she would call us when he wanted to try to urinate again.

## 2015-03-01 ENCOUNTER — Inpatient Hospital Stay (HOSPITAL_COMMUNITY): Payer: 59

## 2015-03-01 DIAGNOSIS — I6789 Other cerebrovascular disease: Secondary | ICD-10-CM

## 2015-03-01 LAB — LIPID PANEL
CHOL/HDL RATIO: 4.7 ratio
CHOLESTEROL: 136 mg/dL (ref 0–200)
HDL: 29 mg/dL — AB (ref >40)
LDL Cholesterol: 81 mg/dL (ref 0–99)
TRIGLYCERIDES: 132 mg/dL (ref <150)
VLDL: 26 mg/dL (ref 0–40)

## 2015-03-01 LAB — GLUCOSE, CAPILLARY
GLUCOSE-CAPILLARY: 134 mg/dL — AB (ref 65–99)
GLUCOSE-CAPILLARY: 152 mg/dL — AB (ref 65–99)
Glucose-Capillary: 111 mg/dL — ABNORMAL HIGH (ref 65–99)
Glucose-Capillary: 136 mg/dL — ABNORMAL HIGH (ref 65–99)

## 2015-03-01 LAB — MRSA PCR SCREENING: MRSA by PCR: NEGATIVE

## 2015-03-01 NOTE — Progress Notes (Signed)
Pt a/o.vss. Neuro checks being monitored by nursing staff. No complaints of any distress. Report called to L.Schonewit, Therapist, sports. Pt transferred room 308 via wheelchair with nursing staff.

## 2015-03-01 NOTE — Plan of Care (Deleted)
Problem: tPA Day Progression Outcomes-Only if tPA administered Goal: Inclusion criteria for tPA STANDARD: < 3hours from symptoms onset: - Diagnosis of ischemic stroke causing measureable neurological deficit - Neurological signs shold not be minor & isolated - Symptoms of stroke should not be sugestive of SAH - No head trauma or prior stroke in previous 3 months - No gastrointestinal or urinary tract hemorrhage in previous 21 days - No arterial puncture at a noncompressible site in previous 7 days - BP not elevated [systolic <431 mmHg, diastolic < 540 mmHg] - Not taking oral anticoagulant or if being taken INR < or equal 1.7 - Platelet count > or equal 100,000 mm3 - No seizure w/postictal residual neurological impairments - Pt/family understand potential risks/benefits from treatment - Caution in pts with NIHSS > 22 - Seizure at stroke onset eligible for tPA if residual impairments due to stroke and not the seizures. - Neurological signs should not be clearing spontaneously - Exercise caution in treating pt with major deficits - Symptoms onset < 3hrs before beginning treatment - No MI in previous 3 months - No major surgery in previous 14 days - No history previous intracranial hemorrhage - No evidence active bleeding/acute trauma (fracture) on examinations - If receiving heparin in previous 48 hrs, aPTT must be normal range - Abnormal Blood Glucose < 50 or > 400 mg/dL - CT does not show multilobar infarction [hypodensity >1/3 cerebral hemisphere] EXTENDED: 3-4.5 hrous from symptom onset - Age > 80 years - History of prior stroke and diabetes - Any anticoagulant use prior to admission (even if INR < 1.7) - NIHSS > 25"  Outcome: Not Applicable Date Met:  08/67/61 Pt not a candidate for tPA

## 2015-03-01 NOTE — Progress Notes (Signed)
PROGRESS NOTE  Travis [name] [id number]:[id number] DOB: 06/25/1960 DOA: 02/28/2015 PCP: [name], MD  Summary: 37 yom PMH of HLD, HTN, and DM presented with right sided numbness, weakness, slurred speech, and a HA. While in the ED, head CT and labs were unremarkable. CTA of the head and neck revealed decreased caliber and increased prominence of collaterals within the posterior left MCA distribution, concerning for a developing infarct in this area versus chronic stenosis as well as a remote lacunar infarct of the left caudate head. Patient was seen by teleneurology and it was agreed upon that he was not a candidate for tPA given he was last seen well at 3am (about 5 hours prior to his arrival to the ED). EDP discussed with radiology and with Dr. Doy Mince neurology and patient was not felt to be a candidate for intervention and transfer to [facility] was not recommended. He was admitted for further management and stroke workup.   Assessment/Plan: 1. Small acute right cerebellar infarcts with RUE pass pointing and dysarthria.Improving.  LDL 81, Neurology, PT, OT, and ST consulted. Neurology recommends Plavix and TEE.  2. DM type 2, stable. Continue SSI and monitor 3. Essential HTN, stable.  4. HLD, continue statin.   Overall improved.   Follow up ST, OT, and PT recommendations.  Follow up ECHO and TEE.  Move upstairs today.   Code Status: Full DVT prophylaxis:Lovenox Family Communication: No family at bedside. Discussed with patient who understands and has no concerns at this time. Disposition Plan: Move to medical floor today  Murray Hodgkins, MD  [facility]  Pager [phone] If 7PM-7AM, please contact night-coverage at [email or web address], password [facility] 03/01/2015, 6:45 AM  LOS: 1 day   Consultants:  Neurology  Procedures:    Antibiotics:  none  HPI/Subjective: Feels groggy. Strength in his arms and legs are about the same. Denies any numbness, tingling, nausea, SOB, or  pain.   Objective: Filed Vitals:   03/01/15 0200 03/01/15 0300 03/01/15 0400 03/01/15 0500  BP: 122/83 113/75 126/85 103/71  Pulse: 70 69 62 71  Temp:   97.4 F (36.3 C)   TempSrc:   Oral   Resp: 14 20 15 14   Height:      Weight:   78.6 kg (173 lb 4.5 oz)   SpO2: 96% 95% 96% 95%    Intake/Output Summary (Last 24 hours) at 03/01/15 0645 Last data filed at 03/01/15 0410  Gross per 24 hour  Intake    120 ml  Output   1175 ml  Net  -1055 ml     Filed Weights   02/28/15 0759 02/28/15 1237 03/01/15 0400  Weight: 83.915 kg (185 lb) 83.9 kg (184 lb 15.5 oz) 78.6 kg (173 lb 4.5 oz)    Exam:     VSS, afebrile, not hypoxic General:  Appears calm and comfortable Eyes: PERRL, normal lids, irises & conjunctiva ENT: grossly normal hearing, lips & tongue Neck: no LAD, masses or thyromegaly Cardiovascular: RRR, no m/r/g. No LE edema. Telemetry: SR, no arrhythmias  Respiratory: CTA bilaterally, no w/r/r. Normal respiratory effort. Abdomen: soft, ntnd, positive bowel sounds Musculoskeletal:  strength 4/5 on RLE, 5/5 on LLE Psychiatric: grossly normal mood and affect, speech fluent and appropriate Neurologic: CN 2-12 intact. No pronator drift, finger to nose on right has improved coordination, finger to nose on left is normal   New data reviewed:  LDL 81   Pertinent data since admission:   MRI  IMPRESSION: 1. Small acute right cerebellar infarcts, most  affecting the right SCA territory. No hemorrhage or mass effect. 2. Underlying chronic bilateral cerebellar infarcts, and small bilateral MCA infarcts.  Pending data:  ECHO  TEE  Scheduled Meds: . aspirin  300 mg Rectal Daily   Or  . aspirin  325 mg Oral Daily  . clopidogrel  75 mg Oral Q breakfast  . enoxaparin (LOVENOX) injection  40 mg Subcutaneous Q24H  . folic acid  1 mg Oral Daily  . insulin aspart  0-9 Units Subcutaneous TID WC  . pneumococcal 23 valent vaccine  0.5 mL Intramuscular Tomorrow-1000  .  pravastatin  20 mg Oral q1800  . pregabalin  75 mg Oral TID  . topiramate  50 mg Oral BID   Continuous Infusions:   Principal Problem:   Cerebellar stroke ([place]) Active Problems:   Essential hypertension   HLD (hyperlipidemia)   Time spent 20 minutes  By signing my name below, I, Rosalie Doctor attest that this documentation has been prepared under the direction and in the presence of Murray Hodgkins, MD Electronically signed: Rosalie Doctor, Scribe. 03/01/2015 8:19am

## 2015-03-01 NOTE — Progress Notes (Signed)
  Echocardiogram 2D Echocardiogram has been performed.  Lysle Rubens 03/01/2015, 1:43 PM

## 2015-03-01 NOTE — Progress Notes (Signed)
Utilization review Completed Senna Lape RN BSN   

## 2015-03-01 NOTE — Evaluation (Signed)
Physical Therapy Evaluation Patient Details Name: Travis [name] MRN: [id number] DOB: 12/21/1960 Today's Date: 03/01/2015   History of Present Illness  38 yom PMH of HLD, HTN, and DM presented with right sided numbness, weakness, slurred speech, and a HA. While in the ED, head CT and labs were unremarkable. CTA of the head and neck revealed decreased caliber and increased prominence of collaterals within the posterior left MCA distribution, concerning for a developing infarct in this area versus chronic stenosis as well as a remote lacunar infarct of the left caudate head. Patient was seen by teleneurology and it was agreed upon that he was not a candidate for tPA given he was last seen well at 3am (about 5 hours prior to his arrival to the ED). EDP discussed with radiology and with Dr. Doy Mince neurology and patient was not felt to be a candidate for intervention and transfer to [facility] was not recommended. He was admitted for further management and stroke workup.   Clinical Impression  Patient presents in bed, fatigued but very pleasant overall. Please note that patient had recently been discharged from outpatient PT for vertigo, which he does still continue to experience but this does not appear to be any worse after this acute event. Able to complete bed mobility with independence and able to complete standing marches, forward/backward/lateral walking with walker and just Min guard; however when performing forward/backward/lateral gait with no device and B HHA, patient was unsteady and did present with ataxic gait pattern- much more difficulty with mobility with no device today. Significant impairment with rapid alternating movements and general movement regulation on R side as compared to L, as is expected at this point. At this time patient might benefit from skilled therapy services either at [facility] or with HHPT/outpatient follow-up due to acute change in mobility and balance skills. He will continue to  receive skilled PT services while he remains in this facility. RN notified regarding patient's mobility status; patient left in bed with all needs met.     Follow Up Recommendations Other (comment) (CIR versus HHPT/follow up with outpatient PT )    Equipment Recommendations  Rolling walker with 5" wheels    Recommendations for Other Services OT consult     Precautions / Restrictions Precautions Precautions: Fall Restrictions Weight Bearing Restrictions: No      Mobility  Bed Mobility Overal bed mobility: Independent             General bed mobility comments: supine to sit/sit to supine safely with distant supervision   Transfers Overall transfer level: Needs assistance Equipment used: Standard walker;None Transfers: Sit to/from Stand Sit to Stand: Min guard         General transfer comment: min guard for safety with and without assistive device   Ambulation/Gait Ambulation/Gait assistance: Min guard;Min assist   Assistive device: Rolling walker (2 wheeled);None Gait Pattern/deviations: Decreased weight shift to right;Ataxic;Staggering right;Wide base of support Gait velocity: appropriate for situation    General Gait Details: trialed forwards/backwards/right and left lateral gait both with and without wakler; light min guard with walker however patient unsteady and requires Min(A) for balance without assistive device due to ataxic pattern   Stairs            Wheelchair Mobility    Modified Rankin (Stroke Patients Only)       Balance Overall balance assessment: Needs assistance Sitting-balance support: No upper extremity supported Sitting balance-Leahy Scale: Good     Standing balance support: No upper extremity supported  Standing balance-Leahy Scale: Fair               High level balance activites: Side stepping;Backward walking High Level Balance Comments: marked unsteadiness and ataxic pattern when performed without device               Pertinent Vitals/Pain Pain Assessment: No/denies pain    Home Living Family/patient expects to be discharged to:: Private residence Living Arrangements: Spouse/significant other Available Help at Discharge: Family Type of Home: House       Home Layout: One level Home Equipment: None      Prior Function Level of Independence: Independent               Hand Dominance   Dominant Hand: Right    Extremity/Trunk Assessment   Upper Extremity Assessment: Defer to OT evaluation           Lower Extremity Assessment: RLE deficits/detail RLE Deficits / Details: definite difficulty with rapid alternating movements and regulation of movement R UE and LE; very mild reduction in strength R LE vs L LE     Cervical / Trunk Assessment: Normal  Communication   Communication: No difficulties  Cognition Arousal/Alertness: Awake/alert Behavior During Therapy: WFL for tasks assessed/performed                        General Comments General comments (skin integrity, edema, etc.): patient contineus to experience vertigo    Exercises        Assessment/Plan    PT Assessment Patient needs continued PT services  PT Diagnosis Difficulty walking;Abnormality of gait   PT Problem List Decreased coordination;Decreased strength;Decreased knowledge of use of DME;Decreased balance;Decreased safety awareness;Decreased mobility  PT Treatment Interventions DME instruction;Therapeutic exercise;Gait training;Balance training;Neuromuscular re-education;Functional mobility training;Therapeutic activities;Patient/family education   PT Goals (Current goals can be found in the Care Plan section) Acute Rehab PT Goals Patient Stated Goal: to get better control of right side, move better  PT Goal Formulation: With patient Time For Goal Achievement: 03/15/15 Potential to Achieve Goals: Good    Frequency Min 6X/week   Barriers to discharge        Co-evaluation                End of Session Equipment Utilized During Treatment: Gait belt Activity Tolerance: Patient tolerated treatment well Patient left: in bed;with call bell/phone within reach;Other (comment) (all needs met ) Nurse Communication: Mobility status         Time: 1000-1020 PT Time Calculation (min) (ACUTE ONLY): 20 min   Charges:   PT Evaluation $Initial PT Evaluation Tier I: 1 Procedure     PT G Codes:        Deniece Ree PT, DPT [phone]

## 2015-03-02 LAB — GLUCOSE, CAPILLARY
GLUCOSE-CAPILLARY: 153 mg/dL — AB (ref 65–99)
GLUCOSE-CAPILLARY: 143 mg/dL — AB (ref 65–99)
Glucose-Capillary: 170 mg/dL — ABNORMAL HIGH (ref 65–99)
Glucose-Capillary: 112 mg/dL — ABNORMAL HIGH (ref 65–99)
Glucose-Capillary: 60 mg/dL — ABNORMAL LOW (ref 65–99)

## 2015-03-02 MED ORDER — ACETAMINOPHEN 325 MG PO TABS: 650.0000 mg | ORAL_TABLET | Freq: Four times a day (QID) | ORAL | Status: DC | PRN

## 2015-03-02 MED ORDER — HYDROCODONE-ACETAMINOPHEN 5-325 MG PO TABS
1.0000 | ORAL_TABLET | Freq: Four times a day (QID) | ORAL | Status: DC | PRN
  Administered 2015-03-02: 1 via ORAL
  Filled 2015-03-02: qty 1

## 2015-03-02 MED ORDER — GLYBURIDE MICRONIZED 3 MG PO TABS
6.0000 mg | ORAL_TABLET | Freq: Two times a day (BID) | ORAL | Status: DC
  Administered 2015-03-02 – 2015-03-03 (×3): 6 mg via ORAL
  Filled 2015-03-02 (×6): qty 2

## 2015-03-02 NOTE — Progress Notes (Addendum)
PROGRESS NOTE  Travis [name] PYK:[id number] DOB: 07-29-1960 DOA: 02/28/2015 PCP: [name], MD  Summary: 13 yom PMH of HLD, HTN, and DM presented with right sided numbness, weakness, slurred speech, and a HA.  CTA of the head and neck revealed decreased caliber and increased prominence of collaterals within the posterior left MCA distribution, concerning for a developing infarct in this area versus chronic stenosis as well as a remote lacunar infarct of the left caudate head. Patient was seen by teleneurology and it was agreed upon that he was not a candidate for tPA given he was last seen well at 3am (about 5 hours prior to his arrival to the ED). EDP discussed with radiology and with Dr. Doy Mince neurology and patient was not felt to be a candidate for intervention and transfer to [facility] was not recommended. He was admitted for further management and stroke workup.   Assessment/Plan: 1. Small acute right cerebellar infarcts with RUE pass pointing and dysarthria.Improving daily.  LDL 81, OT and ST consulted. Appreciate neurology recommendations. PT has evaluated recommendations as below. ECHO results below.  2. DM type 2, stable. Continue SSI and monitor 3. Essential HTN, stable.    Overall improving.continue ASA, Plavix, statin  TEE 10/31  Anticipate discharge 10/31  Code Status: Full DVT prophylaxis:Lovenox Family Communication: Family at bedside. Discussed with patient who understands and has no concerns at this time. Disposition Plan: Anticipate discharge within 24 hours.   Murray Hodgkins, MD  [facility]  Pager [phone] If 7PM-7AM, please contact night-coverage at [email or web address], password [email or web address] 03/02/2015, 6:58 AM  LOS: 2 days   Consultants:  Neurology  PT- recommended CIR verus HHPT/follow up with outpatient PT  Procedures:  ECHO Study Conclusions - Left ventricle: The cavity size was normal. There was mild focal basal hypertrophy of the septum. Systolic  function was normal. The estimated ejection fraction was in the range of 50% to 55%. Inferior hypokinesis, Doppler parameters are consistent with abnormal left ventricular relaxation (grade 1 diastolic dysfunction). The E/e&' ratio is <8, suggesting normal LV filling pressure. - Aorta: Aortic root dimension: 40 mm (ED). - Aortic root: The aortic root is dilated. - Left atrium: The atrium was normal in size. - Tricuspid valve: There was trivial regurgitation.  Antibiotics:  none  HPI/Subjective:  Feeling well. Was able to eat without difficulty. Mild pain in head and hip, hip pain is chronic. Improvement in fine movement of right arm and leg.   Objective: Filed Vitals:   03/01/15 1437 03/01/15 1837 03/02/15 0030 03/02/15 0400  BP: 131/84 137/92 135/86 138/90  Pulse: 68 87 79 86  Temp: 98.8 F (37.1 C) 98.8 F (37.1 C)  99.4 F (37.4 C)  TempSrc: Oral Oral  Oral  Resp: 16 18 16 15   Height:      Weight:      SpO2: 97% 99% 95% 96%    Intake/Output Summary (Last 24 hours) at 03/02/15 0658 Last data filed at 03/01/15 1900  Gross per 24 hour  Intake    480 ml  Output    300 ml  Net    180 ml     Filed Weights   02/28/15 0759 02/28/15 1237 03/01/15 0400  Weight: 83.915 kg (185 lb) 83.9 kg (184 lb 15.5 oz) 78.6 kg (173 lb 4.5 oz)    Exam:     VSS, afebrile, not hypoxic General:  Appears calm and comfortable. Sitting up in bed.  Eyes: PERRL, normal lids, irises & conjunctiva Cardiovascular: RRR, no m/r/g.  No LE edema. Respiratory: CTA bilaterally, no w/r/r. Normal respiratory effort. Musculoskeletal:  Some weakness RUE 4+/5. LUE intact. Psychiatric: grossly normal mood and affect, speech fluent and appropriate Neurologic: CN 2-12 intact. No pronator drift. Some difficulty with pass pointing on the right, but improved. No pass pointing on the left. Some difficulty with knee to shin on right, none on the left.   New data reviewed:  CBG stable  Pertinent  data since admission:   MRI  IMPRESSION: 1. Small acute right cerebellar infarcts, most affecting the right SCA territory. No hemorrhage or mass effect. 2. Underlying chronic bilateral cerebellar infarcts, and small bilateral MCA infarcts.  Pending data:  TEE  Scheduled Meds: . aspirin  300 mg Rectal Daily   Or  . aspirin  325 mg Oral Daily  . clopidogrel  75 mg Oral Q breakfast  . enoxaparin (LOVENOX) injection  40 mg Subcutaneous Q24H  . folic acid  1 mg Oral Daily  . insulin aspart  0-9 Units Subcutaneous TID WC  . pravastatin  20 mg Oral q1800  . pregabalin  75 mg Oral TID  . topiramate  50 mg Oral BID   Continuous Infusions:   Principal Problem:   Cerebellar stroke ([place]) Active Problems:   Essential hypertension   HLD (hyperlipidemia)   Time spent 20 minutes   By signing my name below, I, Rennis Harding attest that this documentation has been prepared under the direction and in the presence of Murray Hodgkins, MD Electronically signed: Rennis Harding  03/02/2015 12:07pm.  I personally performed the services described in this documentation. All medical record entries made by the scribe were at my direction. I have reviewed the chart and agree that the record reflects my personal performance and is accurate and complete. Murray Hodgkins, MD

## 2015-03-02 NOTE — Progress Notes (Signed)
Hypoglycemic Event  CBG: 60  Treatment:  Carb snack  Symptoms:  Nonsymptomatic  Follow-up CBG: Time: 2145 CBG Result:143  Possible Reasons for Event:  Low carb intake  Comments/MD notified: Notified Dr. Robie Ridge, Vita Barley D

## 2015-03-02 NOTE — Plan of Care (Signed)
Problem: Acute Treatment Outcomes Goal: Neuro exam at baseline or improved Outcome: Progressing Pt able to perform all tasks and answer all questions.   Problem: Progression Outcomes Goal: Rehab Team goals identified Outcome: Progressing Home health or outpatient pt identified.

## 2015-03-03 ENCOUNTER — Telehealth: Payer: Self-pay

## 2015-03-03 ENCOUNTER — Inpatient Hospital Stay (HOSPITAL_COMMUNITY): Payer: 59

## 2015-03-03 ENCOUNTER — Telehealth: Payer: Self-pay | Admitting: Neurology

## 2015-03-03 ENCOUNTER — Encounter (HOSPITAL_COMMUNITY)
Admission: EM | Disposition: A | Discharge status: Home Health | Payer: Self-pay | Source: Home / Self Care | Attending: Family Medicine

## 2015-03-03 ENCOUNTER — Encounter (HOSPITAL_COMMUNITY): Payer: Self-pay | Admitting: Cardiovascular Disease

## 2015-03-03 DIAGNOSIS — I361 Nonrheumatic tricuspid (valve) insufficiency: Secondary | ICD-10-CM

## 2015-03-03 DIAGNOSIS — I639 Cerebral infarction, unspecified: Secondary | ICD-10-CM

## 2015-03-03 HISTORY — PX: TEE WITHOUT CARDIOVERSION: SHX5443

## 2015-03-03 LAB — GLUCOSE, CAPILLARY
GLUCOSE-CAPILLARY: 91 mg/dL (ref 65–99)
Glucose-Capillary: 128 mg/dL — ABNORMAL HIGH (ref 65–99)
Glucose-Capillary: 117 mg/dL — ABNORMAL HIGH (ref 65–99)
Glucose-Capillary: 86 mg/dL (ref 65–99)

## 2015-03-03 LAB — HEMOGLOBIN A1C
Hgb A1c MFr Bld: 6.1 % — ABNORMAL HIGH (ref 4.8–5.6)
MEAN PLASMA GLUCOSE: 128 mg/dL

## 2015-03-03 SURGERY — ECHOCARDIOGRAM, TRANSESOPHAGEAL
Anesthesia: Moderate Sedation

## 2015-03-03 MED ORDER — CLOPIDOGREL BISULFATE 75 MG PO TABS: 75.0000 mg | ORAL_TABLET | Freq: Every day | ORAL | Status: DC

## 2015-03-03 MED ORDER — LIDOCAINE VISCOUS 2 % MT SOLN
OROMUCOSAL | Status: AC
  Filled 2015-03-03: qty 15

## 2015-03-03 MED ORDER — ASPIRIN 325 MG PO TABS: 325.0000 mg | ORAL_TABLET | Freq: Every day | ORAL | Status: DC

## 2015-03-03 MED ORDER — BUTAMBEN-TETRACAINE-BENZOCAINE 2-2-14 % EX AERO
INHALATION_SPRAY | CUTANEOUS | Status: DC | PRN
  Administered 2015-03-03: 2 via TOPICAL

## 2015-03-03 MED ORDER — MIDAZOLAM HCL 5 MG/5ML IJ SOLN
INTRAMUSCULAR | Status: DC | PRN
  Administered 2015-03-03: 1 mg via INTRAVENOUS
  Administered 2015-03-03 (×3): 2 mg via INTRAVENOUS

## 2015-03-03 MED ORDER — FENTANYL CITRATE (PF) 100 MCG/2ML IJ SOLN
INTRAMUSCULAR | Status: DC | PRN
  Administered 2015-03-03: 50 ug via INTRAVENOUS
  Administered 2015-03-03: 25 ug via INTRAVENOUS

## 2015-03-03 MED ORDER — LIDOCAINE VISCOUS 2 % MT SOLN
OROMUCOSAL | Status: DC | PRN
  Administered 2015-03-03: 3 mL via OROMUCOSAL

## 2015-03-03 MED ORDER — FOLIC ACID 1 MG PO TABS: 1.0000 mg | ORAL_TABLET | Freq: Every day | ORAL | Status: DC

## 2015-03-03 MED ORDER — MIDAZOLAM HCL 5 MG/5ML IJ SOLN
INTRAMUSCULAR | Status: AC
  Filled 2015-03-03: qty 5

## 2015-03-03 MED ORDER — SODIUM CHLORIDE BACTERIOSTATIC 0.9 % IJ SOLN
INTRAMUSCULAR | Status: AC
  Filled 2015-03-03: qty 20

## 2015-03-03 MED ORDER — FENTANYL CITRATE (PF) 100 MCG/2ML IJ SOLN
INTRAMUSCULAR | Status: AC
  Filled 2015-03-03: qty 4

## 2015-03-03 MED ORDER — SODIUM CHLORIDE 0.9 % IV SOLN
INTRAVENOUS | Status: DC
  Administered 2015-03-03: 1000 mL via INTRAVENOUS

## 2015-03-03 NOTE — Procedures (Signed)
Preliminary report: Normal LV systolic function, EF 82-42%. Mild tricuspid regurgitation. No vegetations. No left atrial appendage thrombus. Small PFO.

## 2015-03-03 NOTE — Consult Note (Signed)
CARDIOLOGY CONSULT NOTE   Patient ID: Travis [name] MRN: [id number] DOB/AGE: 12-15-1960 54 y.o.  Admit Date: 02/28/2015 Referring Physician: PTH-Goodrich MD Primary Physician: [name], MD Consulting Cardiologist: Carlyle Dolly MD Primary Cardiologist: New Reason for Consultation: Need for TEE and cardiac monitor in the setting of acute CVA.   Clinical Summary Travis [name] is a 54 y.o.male with known history of hypertension, hyperlipidemia, and diabetes, but no cardiac history,  who presented to the ER with sudden onset of right sided weakness, slurred speech and a headache. CT of the head demonstrated non-hemorrhagic CVA with remote left lacunar infarcts. MRI did not reveal aneurysm or stenosis,  We are asked for recommendations for need for TEE and cardiac monitor as etiology for these infarcts.   In ER BP 126/95, HR 67, O2 Sat 98%. He was treated with ASA, and zofran.  He has been seen by neurology, and had been treated for what were believed to be seizures in the past. He has been placed on Plavix and ASA by Dr. Merlene Laughter. He is without complaint this am, but appears to have some mild confusion vs psychological issues of anxiety. He is rocking back and forth and does not make eye contact.    No Known Allergies  Medications Scheduled Medications: . aspirin  300 mg Rectal Daily   Or  . aspirin  325 mg Oral Daily  . clopidogrel  75 mg Oral Q breakfast  . enoxaparin (LOVENOX) injection  40 mg Subcutaneous Q24H  . folic acid  1 mg Oral Daily  . glyBURIDE micronized  6 mg Oral BID WC  . insulin aspart  0-9 Units Subcutaneous TID WC  . pravastatin  20 mg Oral q1800  . pregabalin  75 mg Oral TID  . topiramate  50 mg Oral BID      PRN Medications: acetaminophen, HYDROcodone-acetaminophen, ondansetron (ZOFRAN) IV   Past Medical History  Diagnosis Date  . Night sweats     every once in a while  . Hyperlipidemia   . Diabetes mellitus   . Hernia, inguinal   .  Dizziness   . Chronic headaches   . Arthritis   . Neuropathy of lower extremity   . Diabetic peripheral neuropathy ([place]) 03/28/2013  . Diverticulosis   . Hypertension   . Complication of anesthesia     bleed after last shoulder-had to stay overnight  . Impingement syndrome of left shoulder 08/02/2014  . 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 Multi-infarct state ([place]) 10/14/2014  . Depression   . Anxiety     Past Surgical History  Procedure Laterality Date  . Inguinal hernia repair Bilateral   . Cervical disc surgery    . Wrist surgery Right     fusion  . Shoulder arthroscopy Right     1  . Knee arthroscopy Right   . Colon surgery  2003    1/3 removed for diverticulitis  . Colonoscopy    . Umbilical hernia repair      with other hernia repair with mesh  . Shoulder arthroscopy Left 08/02/2014    Procedure: LEFT SHOULDER SCOPE DEBRIDEMENT/ACROMIOPLASTY;  Surgeon: [name], MD;  Location: [place];  Service: Orthopedics;  Laterality: Left;  ANESTHESIA: GENERAL, PRE/POST OP SCALENE    Family History  Problem Relation Age of Onset  . Stroke Father   . Hyperlipidemia Father   . Diabetes Maternal Grandfather   . Heart attack Sister 97  . ALS Brother 16  .  Dementia Mother     Social History Travis [name] reports that he quit smoking about 36 years ago. His smoking use included Cigarettes. He started smoking about 44 years ago. He has a 8 pack-year smoking history. He quit smokeless tobacco use about 36 years ago. His smokeless tobacco use included Chew. Travis [name] reports that he drinks about 0.6 oz of alcohol per week.  Review of Systems Complete review of systems are found to be negative unless outlined in H&P above.  Physical Examination Blood pressure 126/86, pulse 70, temperature 98 F (36.7 C), temperature source Oral, resp. rate 18, height 6\' 1"  (1.854 m), weight 173 lb 4.5 oz (78.6 kg), SpO2 97 %.  Intake/Output Summary (Last 24 hours)  at 03/03/15 0841 Last data filed at 03/02/15 2300  Gross per 24 hour  Intake   1070 ml  Output      0 ml  Net   1070 ml    Telemetry: NSR bradycardic  GEN: No acute distress, rocking back and forth  HEENT: Conjunctiva and lids normal, oropharynx clear with moist mucosa. Neck: Supple, no elevated JVP or carotid bruits, no thyromegaly. Lungs: Clear to auscultation, nonlabored breathing at rest. Cardiac: Regular rate and rhythm, bradycardic, no S3 or significant systolic murmur, no pericardial rub. Abdomen: Soft, nontender, no hepatomegaly, bowel sounds present, no guarding or rebound. Extremities: No pitting edema, distal pulses 2+. Skin: Warm and dry. Musculoskeletal: No kyphosis. Neuropsychiatric: Alert and oriented x3, makes eye contact only briefly.   Prior Cardiac Testing/Procedures 1. Echocardiogram 03/01/2015 Left ventricle: The cavity size was normal. There was mild focal basal hypertrophy of the septum. Systolic function was normal. The estimated ejection fraction was in the range of 50% to 55%. Inferior hypokinesis, Doppler parameters are consistent with abnormal left ventricular relaxation (grade 1 diastolic dysfunction). The E/e&' ratio is <8, suggesting normal LV filling pressure. - Aorta: Aortic root dimension: 40 mm (ED). - Aortic root: The aortic root is dilated. - Left atrium: The atrium was normal in size. - Tricuspid valve: There was trivial regurgitation.  Carotid Study 10/05/2014  RIGHT CAROTID ARTERY: No significant atherosclerotic plaque or evidence of stenosis. RIGHT VERTEBRAL ARTERY: Patent with normal antegrade flow. LEFT CAROTID ARTERY: No significant atherosclerotic plaque or evidence of stenosis pair LEFT VERTEBRAL ARTERY: Patent with normal antegrade flow. IMPRESSION: Negative bilateral carotid duplex study. No evidence of significant atherosclerotic plaque or stenosis.  Lab Results  Basic Metabolic Panel:  Recent Labs Lab  02/28/15 0755 02/28/15 0807  NA 141 143  K 4.3 4.3  CL 113* 108  CO2 24  --   GLUCOSE 125* 121*  BUN 24* 22*  CREATININE 1.13 1.10  CALCIUM 8.8*  --     Liver Function Tests:  Recent Labs Lab 02/28/15 0755  AST 16  ALT 10*  ALKPHOS 50  BILITOT 0.6  PROT 6.4*  ALBUMIN 3.9    CBC:  Recent Labs Lab 02/28/15 0755 02/28/15 0807  WBC 4.7  --   NEUTROABS 2.8  --   HGB 13.6 13.3  HCT 39.1 39.0  MCV 95.8  --   PLT 147*  --     Radiology: CTA HEAD 03/31/2015  Anterior circulation: The internal carotid arteries are within normal limits through the skullbase to the ICA termini bilaterally. The A1 and M1 segments are normal. Anterior communicating artery is patent. The MCA bifurcations are intact.  MCA branch vessels are less robust on the left, particularly posteriorly and inferiorly compared to the right. Collaterals are somewhat  increased on the left.  Posterior circulation: The right vertebral artery is the dominant vessel. The left PICA origin is visualized and normal. The right AICA is dominant. The basilar artery is within normal limits. Both posterior cerebral arteries originate from the basilar tip. The PCA branch vessels are intact.  Venous sinuses: The dural sinuses are patent. The straight sinus and deep cerebral veins are intact. Cortical veins are unremarkable.  Anatomic variants: None.  Delayed phase: The postcontrast images demonstrate no pathologic enhancement.  IMPRESSION: 1. Decreased caliber and increased prominence of collaterals within the posterior left MCA distribution raise concern for a developing infarct in this area versus chronic stenosis. 2. No significant proximal stenosis, aneurysm, or branch vessel occlusion. 3. The cervical vasculature is within normal limits. 4. Remote lacunar infarct of the left caudate head. 5. Cervical spondylosis as described.   MRI 02/28/2015 IMPRESSION: 1. Small acute right cerebellar  infarcts, most affecting the right SCA territory. No hemorrhage or mass effect. 2. Underlying chronic bilateral cerebellar infarcts, and small bilateral MCA infarcts.  ECG: NSR rate of 68 bpm   Impression and Recommendations  1. Acute Right Cerebellar CVA: Plan for TEE per neuro recs  Signed: Phill Myron. Lawrence NP Anna  03/03/2015, 8:41 AM Co-Sign MD  Cardiology is consulted for TEE only. Will arrange to have completed for evaluation of cardioembolic stroke.   Zandra Abts MD

## 2015-03-03 NOTE — Progress Notes (Signed)
Physical Therapy Treatment Patient Details Name: Travis [name] MRN: [id number] DOB: 25-Jul-1960 Today's Date: 03/03/2015    History of Present Illness 54 y/o male PMH of HLD, HTN, and DM presented with right sided numbness, weakness, slurred speech, and a HA. While in the ED, head CT and labs were unremarkable. CTA of the head and neck revealed decreased caliber and increased prominence of collaterals within the posterior left MCA distribution, concerning for a developing infarct in this area versus chronic stenosis as well as a remote lacunar infarct of the left caudate head. Patient was seen by teleneurology and it was agreed upon that he was not a candidate for tPA given he was last seen well at 3am (about 5 hours prior to his arrival to the ED). EDP discussed with radiology and with Dr. Doy Mince neurology and patient was not felt to be a candidate for intervention and transfer to [facility] was not recommended. He was admitted for further management and stroke workup.     PT Comments    Patient presented side-lying in bed, pleasant but somewhat frustrated that he has not been able to eat and his test got moved back to later in the day. Focused on gait with single point cane today; required cues for sequencing and safety, also to reduce wide base of support. Patient overall did well with cane but was unsteady, requiring Min guard for safety and occasional Min assist to correct balance. Also performed balance exercises today with focus on tandem gait with single point cane and SLS with single point cane; able to perform both tasks but still unsteady. Overall patient has appeared to be improving since his initial evaluation and he will continue to benefit from skilled PT services moving forward.   Follow Up Recommendations  Home health PT;Other (comment) (HHPT with Outpatient follow-up )     Equipment Recommendations  Rolling walker with 5" wheels    Recommendations for Other Services        Precautions / Restrictions Precautions Precautions: Fall Restrictions Weight Bearing Restrictions: No    Mobility  Bed Mobility Overal bed mobility: Modified Independent             General bed mobility comments: Patient used bed rails for supine to sit.  Transfers Overall transfer level: Needs assistance Equipment used: Straight cane Transfers: Sit to/from Stand Sit to Stand: Min guard         General transfer comment: min guard for safety   Ambulation/Gait Ambulation/Gait assistance: Min guard;Min assist   Assistive device: Straight cane Gait Pattern/deviations: Step-through pattern;Ataxic;Drifts right/left;Wide base of support Gait velocity: appropriate for situation    General Gait Details: gait within room, approximately 6ftx4 with straight cane    Stairs            Wheelchair Mobility    Modified Rankin (Stroke Patients Only)       Balance Overall balance assessment: Needs assistance Sitting-balance support: No upper extremity supported Sitting balance-Leahy Scale: Good     Standing balance support: Single extremity supported Standing balance-Leahy Scale: Fair                 High Level Balance Comments: tandem gait and SLS performed today with single point cane     Cognition Arousal/Alertness: Awake/alert Behavior During Therapy: WFL for tasks assessed/performed Overall Cognitive Status: Within Functional Limits for tasks assessed                      Exercises  General Comments        Pertinent Vitals/Pain Pain Assessment: 0-10 Pain Score: 5  Pain Location: Left hip and head Pain Descriptors / Indicators: Aching Pain Intervention(s): Other (comment) (Pt did not request pain intervention)    Home Living Family/patient expects to be discharged to:: Private residence Living Arrangements: Spouse/significant other Available Help at Discharge: Family Type of Home: House     Home Layout: One level Home  Equipment: None      Prior Function Level of Independence: Independent      Comments: Pt was working a full time job at a [employer].   PT Goals (current goals can now be found in the care plan section) Acute Rehab PT Goals Patient Stated Goal: to get better control of right side, move better  PT Goal Formulation: With patient Time For Goal Achievement: 03/15/15 Potential to Achieve Goals: Good Progress towards PT goals: Progressing toward goals    Frequency  Min 6X/week    PT Plan Current plan remains appropriate    Co-evaluation             End of Session Equipment Utilized During Treatment: Gait belt Activity Tolerance: Patient tolerated treatment well Patient left: in bed;with call bell/phone within reach;with bed alarm set     Time: 5498-2641 PT Time Calculation (min) (ACUTE ONLY): 18 min  Charges:  $Gait Training: 8-22 mins                    G Codes:      Deniece Ree PT, DPT [phone]

## 2015-03-03 NOTE — Telephone Encounter (Signed)
Phone call from Dr. Sarajane Jews Pager [phone]. Stroke work up complete, small PFO. Recommends dual anti platelets, f/u with Dr. Erlinda Hong in 2 months. Page Dr. Sarajane Jews if any further questions or if you need to speak with him.

## 2015-03-03 NOTE — Progress Notes (Addendum)
PROGRESS NOTE  Travis [name]:[id number] DOB: [birth year] DOA: 02/28/2015 PCP: [name], MD  Summary: 54 yom PMH of HLD, HTN, and DM presented with right sided numbness, weakness, slurred speech, and a HA.  CTA of the head and neck revealed decreased caliber and increased prominence of collaterals within the posterior left MCA distribution, concerning for a developing infarct in this area versus chronic stenosis as well as a remote lacunar infarct of the left caudate head. Patient was seen by teleneurology and it was agreed upon that he was not a candidate for tPA given he was last seen well at 3am (about 5 hours prior to his arrival to the ED). EDP discussed with radiology and with Dr. Doy Mince neurology and patient was not felt to be a candidate for intervention and transfer to [facility] was not recommended. He was admitted for further management and stroke workup.   Assessment/Plan: 1. Small acute right cerebellar infarcts with RUE pass pointing and dysarthria.Improving.  LDL 81. Appreciate neurology recommendations. Workup complete. Small PFO on echo. 2. DM type 2, stable. Continue SSI and monitor 3. Essential HTN, stable.    Home today  Continue ASA, Plavix, statin. HH PT, OT, ST  Has f/u arranged with Dr. Erlinda Hong his neurologist 12/19  Discussed in detail with wife by telephone   Murray Hodgkins, MD  [facility]  Pager [phone] If 7PM-7AM, please contact night-coverage at [email or web address], password [email or web address] 03/03/2015, 7:08 AM  LOS: 3 days   Consultants:  Neurology  PT- recommended CIR verus HHPT/follow up with outpatient PT  Procedures:  ECHO Study Conclusions - Left ventricle: The cavity size was normal. There was mild focal basal hypertrophy of the septum. Systolic function was normal. The estimated ejection fraction was in the range of 50% to 55%. Inferior hypokinesis, Doppler parameters are consistent with abnormal left ventricular relaxation (grade 1  diastolic dysfunction). The E/e&' ratio is <8, suggesting normal LV filling pressure. - Aorta: Aortic root dimension: 40 mm (ED). - Aortic root: The aortic root is dilated. - Left atrium: The atrium was normal in size. - Tricuspid valve: There was trivial regurgitation.   TEE Study Conclusions  - Left ventricle: Systolic function was normal. The estimated ejection fraction was in the range of 55% to 60%. - Aortic valve: Mildly thickened leaflets. - Aorta: No significant plaque disease. Mild aortic root dilatation. - Mitral valve: Mildly thickened leaflets . There was trivial regurgitation. - Left atrium: No evidence of thrombus in the atrial cavity or appendage. Normal pulsed Doppler velocities. - Atrial septum: There was a small patent foramen ovale, as demonstrated by saline bubble study. - Tricuspid valve: Mildly thickened leaflets. There was mild regurgitation.  Antibiotics:  none  HPI/Subjective: Feeling better; arm and leg coordination improving.  Objective: Filed Vitals:   03/02/15 1600 03/02/15 2105 03/03/15 0001 03/03/15 0400  BP: 132/92 121/94 121/83 109/89  Pulse: 80 76 75 68  Temp: 98.6 F (37 C) 98.2 F (36.8 C)  98.6 F (37 C)  TempSrc: Oral Oral  Oral  Resp: 18 16 16 16   Height:      Weight:      SpO2: 98% 99% 96% 94%    Intake/Output Summary (Last 24 hours) at 03/03/15 0708 Last data filed at 03/02/15 2300  Gross per 24 hour  Intake   1430 ml  Output      0 ml  Net   1430 ml     Filed Weights   02/28/15 0759 02/28/15 1237 03/01/15 0400  Weight: 83.915 kg (185 lb) 83.9 kg (184 lb 15.5 oz) 78.6 kg (173 lb 4.5 oz)    Exam:  VSS, afebrile, not hypoxic General:  Appears calm and comfortable Cardiovascular: RRR, no m/r/g. No LE edema. Telemetry: SR, no arrhythmias  Respiratory: CTA bilaterally, no w/r/r. Normal respiratory effort. Musculoskeletal: grossly normal tone BUE/BLE Psychiatric: grossly normal mood and affect,  speech fluent and appropriate  New data reviewed:  CBG stable  Pertinent data since admission:   MRI  IMPRESSION: 1. Small acute right cerebellar infarcts, most affecting the right SCA territory. No hemorrhage or mass effect. 2. Underlying chronic bilateral cerebellar infarcts, and small bilateral MCA infarcts.  Pending data:  TEE  Scheduled Meds: . aspirin  300 mg Rectal Daily   Or  . aspirin  325 mg Oral Daily  . clopidogrel  75 mg Oral Q breakfast  . enoxaparin (LOVENOX) injection  40 mg Subcutaneous Q24H  . folic acid  1 mg Oral Daily  . glyBURIDE micronized  6 mg Oral BID WC  . insulin aspart  0-9 Units Subcutaneous TID WC  . pravastatin  20 mg Oral q1800  . pregabalin  75 mg Oral TID  . topiramate  50 mg Oral BID   Continuous Infusions:   Principal Problem:   Cerebellar stroke ([place]) Active Problems:   Essential hypertension   HLD (hyperlipidemia)   Time spent 20 minutes   By signing my name below, I, Rennis Harding attest that this documentation has been prepared under the direction and in the presence of Murray Hodgkins, MD Electronically signed: Rennis Harding  03/03/2015   I personally performed the services described in this documentation. All medical record entries made by the scribe were at my direction. I have reviewed the chart and agree that the record reflects my personal performance and is accurate and complete. Murray Hodgkins, MD

## 2015-03-03 NOTE — Progress Notes (Signed)
Patient ID: Travis [name], male   DOB: 01-17-1961, 54 y.o.   MRN: [id number]  [place] A. Merlene Laughter, MD     [email or web address]          Travis [name] is an 54 y.o. male.   Assessment/Plan: Acute right cerebellar infarct - risk factors are dyslipidemia, hypertension, diabetes, past history of nicotine use and a previous infarct. The patient has had several infarcts which is concerning for possible cardioembolic etiology. Consequently, we will have the patient do a transesophageal echocardiography and also a 30 day event monitor. Plavix will be added to aspirin at least for the next 3 months. Subsequent to this the patient can be placed in a single antiplatelet agent depending on the workup.    The patient is anxious to go home. He reports improvements in symptoms on the right side but still not back to normal. He also continues to have some gait ataxia.  GENERAL: He is in no acute distress.  HEENT: Supple. Atraumatic normocephalic.   ABDOMEN: soft  EXTREMITIES: No edema   BACK: Normal.  SKIN: Normal by inspection.   MENTAL STATUS: Drowsy - but arousable with verbal commands. He does follow commands probably he does have mild to moderate dysarthria. Language is normal. Cognition is unremarkable despite being drowsy.  CRANIAL NERVES: Pupils are equal, round and reactive to light and accommodation; extra ocular movements are full, there is no significant nystagmus; visual fields are full; there is mild ptosis on the left otherwise facial muscles are normal and symmetric, there is no flattening of the nasolabial folds; tongue is midline; uvula is midline; shoulder elevation is normal.  MOTOR: Normal tone, bulk and strength; no pronator drift.  COORDINATION: Left finger to nose is normal, left heel-to-shin normal, dysmetria noted on of the right upper extremity and right leg particularly involving the right upper extremity - much improved; No rest tremor; no  intention tremor; no postural tremor; no bradykinesia.  REFLEXES: Deep tendon reflexes are symmetrical and normal. Babinski reflexes are flexor bilaterally.   SENSATION: Normal to light touch.  Gait using walker   Objective: Vital signs in last 24 hours: Temp:  [98.2 F (36.8 C)-99.1 F (37.3 C)] 98.6 F (37 C) (10/31 0400) Pulse Rate:  [68-81] 68 (10/31 0400) Resp:  [16-18] 16 (10/31 0400) BP: (109-145)/(74-94) 109/89 mmHg (10/31 0400) SpO2:  [94 %-99 %] 94 % (10/31 0400)  Intake/Output from previous day: 10/30 0701 - 10/31 0700 In: 1430 [P.O.:1430] Out: -  Intake/Output this shift:   Nutritional status: Diet NPO time specified Except for: [employer], Sips with Meds   Lab Results: Results for orders placed or performed during the hospital encounter of 02/28/15 (from the past 48 hour(s))  Glucose, capillary     Status: Abnormal   Collection Time: 03/01/15  7:43 AM  Result Value Ref Range   Glucose-Capillary 134 (H) 65 - 99 mg/dL  Glucose, capillary     Status: Abnormal   Collection Time: 03/01/15 11:22 AM  Result Value Ref Range   Glucose-Capillary 152 (H) 65 - 99 mg/dL  Glucose, capillary     Status: Abnormal   Collection Time: 03/01/15  4:18 PM  Result Value Ref Range   Glucose-Capillary 111 (H) 65 - 99 mg/dL  Glucose, capillary     Status: Abnormal   Collection Time: 03/01/15  9:20 PM  Result Value Ref Range   Glucose-Capillary 136 (H) 65 - 99 mg/dL   Comment 1 Notify RN    Comment  2 Document in Chart   Glucose, capillary     Status: Abnormal   Collection Time: 03/02/15  7:28 AM  Result Value Ref Range   Glucose-Capillary 153 (H) 65 - 99 mg/dL  Glucose, capillary     Status: Abnormal   Collection Time: 03/02/15 11:36 AM  Result Value Ref Range   Glucose-Capillary 170 (H) 65 - 99 mg/dL  Glucose, capillary     Status: Abnormal   Collection Time: 03/02/15  4:23 PM  Result Value Ref Range   Glucose-Capillary 112 (H) 65 - 99 mg/dL  Glucose, capillary      Status: Abnormal   Collection Time: 03/02/15  9:18 PM  Result Value Ref Range   Glucose-Capillary 60 (L) 65 - 99 mg/dL  Glucose, capillary     Status: Abnormal   Collection Time: 03/02/15 10:06 PM  Result Value Ref Range   Glucose-Capillary 143 (H) 65 - 99 mg/dL    Lipid Panel  Recent Labs  03/01/15 0517  CHOL 136  TRIG 132  HDL 29*  CHOLHDL 4.7  VLDL 26  LDLCALC 81    Studies/Results:   Medications:  Scheduled Meds: . aspirin  300 mg Rectal Daily   Or  . aspirin  325 mg Oral Daily  . clopidogrel  75 mg Oral Q breakfast  . enoxaparin (LOVENOX) injection  40 mg Subcutaneous Q24H  . folic acid  1 mg Oral Daily  . glyBURIDE micronized  6 mg Oral BID WC  . insulin aspart  0-9 Units Subcutaneous TID WC  . pravastatin  20 mg Oral q1800  . pregabalin  75 mg Oral TID  . topiramate  50 mg Oral BID   Continuous Infusions:  PRN Meds:.acetaminophen, HYDROcodone-acetaminophen, ondansetron (ZOFRAN) IV     LOS: 3 days   Arushi Partridge A. Merlene Laughter, M.D.  Diplomate, Tax adviser of Psychiatry and Neurology ( Neurology).

## 2015-03-03 NOTE — Telephone Encounter (Signed)
Hi, Dr. Sarajane Jews:  Thank you for taking care of this pt. I do not have questions at this time. His cranial vasculature did not look that bad. TEE showed small PFO. He likely still need loop recorder due to multiple infarcts on imaging. Will set up as outpt. Also he may need hypercoagulable work up due to young age and relatively unremarkable vessels. Will also do TCD MES in clinic. He has appointment with me on 04/17/15. Thanks for your help.  Rosalin Hawking, MD PhD Stroke Neurology 03/03/2015 4:49 PM

## 2015-03-03 NOTE — Progress Notes (Signed)
Discharged PT per MD order and protocol. Reviewed discharge teaching and handouts given. Pt verbalized understanding and left with all belongings. VSS. IV catheter D/C Oswald Hillock, RN

## 2015-03-03 NOTE — Care Management Note (Signed)
Case Management Note  Patient Details  Name: Travis [name] MRN: [id number] Date of Birth: 1960-08-01  Subjective/Objective:                    Action/Plan:   Expected Discharge Date:                  Expected Discharge Plan:  [place]  In-House Referral:  NA  Discharge planning Services  CM Consult  Post Acute Care Choice:  Durable Medical Equipment, Home Health Choice offered to:  Spouse  DME Arranged:  [facility]ford Rile rolling DME Agency:  [place] Arranged:  PT, OT, Speech Therapy [place] Agency:  [place]  Status of Service:  Completed, signed off  Medicare Important Message Given:    Date Medicare IM Given:    Medicare IM give by:    Date Additional Medicare IM Given:    Additional Medicare Important Message give by:     If discussed at [place] of Stay Meetings, dates discussed:    Additional Comments: ST will be added to home health needs. Pt for discharge home today with [facility] (per pts wife choice). Juliann Pulse with [facility] is aware of referral and will collect the pts information from the chart. [place] services to start within 48 hours of discharge. Rolling walker delivered to pts room from [facility] per pts choice. Pt and pts nurse aware of discharge arrangements. Christinia Gully [place], RN 03/03/2015, 4:28 PM

## 2015-03-03 NOTE — Discharge Summary (Signed)
Physician Discharge Summary  NIKKI RUSNAK DJM:[id number] DOB: [birth year] DOA: 02/28/2015  PCP: [name], MD  Admit date: 02/28/2015 Discharge date: 03/03/2015  Recommendations for Outpatient Follow-up:   Follow up stroke. Started on Plavix.    Follow-up Information    Follow up with [facility].   Contact information:   [address] [place] [address] [phone]       Follow up with [name], MD. Schedule an appointment as soon as possible for a visit in 2 weeks.   Specialty:  Family Medicine   Contact information:   [address] [place] [address] [phone]       Follow up with Xu,Jindong, MD On 04/21/2015.   Specialty:  Neurology   Contact information:   [address] [place] [address] [phone]       Discharge Diagnoses:   Small acute right cerebellar infarcts  DM type 2.  Essential HTN  Discharge Condition: Improved  Disposition: Discharge home   Diet recommendation: Regular   Filed Weights   02/28/15 0759 02/28/15 1237 03/01/15 0400  Weight: 83.915 kg (185 lb) 83.9 kg (184 lb 15.5 oz) 78.6 kg (173 lb 4.5 oz)    History of present illness:  54 yom PMH of HLD, HTN, and DM presented with right sided numbness, weakness, slurred speech, and a HA. CTA of the head and neck revealed decreased caliber and increased prominence of collaterals within the posterior left MCA distribution, concerning for a developing infarct in this area versus chronic stenosis as well as a remote lacunar infarct of the left caudate head. Patient was seen by teleneurology and it was agreed upon that he was not a candidate for tPA given he was last seen well at 3am (about 5 hours prior to his arrival to the ED). EDP discussed with radiology and with Dr. Doy Mince neurology and patient was not felt to be a candidate for intervention and transfer to [facility] was not recommended. He was admitted for further  management and stroke workup.  Hospital Course:  Hospitalization was uncomplicated; some improvement in RUE/LLE cerebellar ataxia. Speech improved. Evaluated by neurology, PT, OT, ST with recs for Plavix added to chronic ASA. Continue statin. Continue risk factor reduction. MRI suggested embolic source though TEE was negative. Plan outpatient event monitor. Has close outpatient f/u arranged.  1. Small acute right cerebellar infarcts with RUE pass pointing and dysarthria.Improving. LDL 81. Appreciate neurology recommendations. Workup complete. Small PFO on TEE.  2. DM type 2, stable. Continue SSI and monitor 3. Essential HTN, stable.  Consultants:  Neurology  PT- recommended CIR verus HHPT/follow up with outpatient PT  Procedures:  ECHO Study Conclusions - Left ventricle: The cavity size was normal. There was mild focal basal hypertrophy of the septum. Systolic function was normal. The estimated ejection fraction was in the range of 50% to 55%. Inferior hypokinesis, Doppler parameters are consistent with abnormal left ventricular relaxation (grade 1 diastolic dysfunction). The E/e&' ratio is <8, suggesting normal LV filling pressure. - Aorta: Aortic root dimension: 40 mm (ED). - Aortic root: The aortic root is dilated. - Left atrium: The atrium was normal in size. - Tricuspid valve: There was trivial regurgitation.   TEE Study Conclusions  - Left ventricle: Systolic function was normal. The estimated ejection fraction was in the range of 55% to 60%. - Aortic valve: Mildly thickened leaflets. - Aorta: No significant plaque disease. Mild aortic root dilatation. - Mitral valve: Mildly thickened leaflets .  There was trivial regurgitation. - Left atrium: No evidence of thrombus in the atrial cavity or appendage. Normal pulsed Doppler velocities. - Atrial septum: There was a small patent foramen ovale, as demonstrated by saline bubble study. - Tricuspid  valve: Mildly thickened leaflets. There was mild regurgitation.  Discharge Instructions Discharge Instructions    Diet - low sodium heart healthy    Complete by:  As directed      Diet Carb Modified    Complete by:  As directed      Discharge instructions    Complete by:  As directed   Call your physician or seek immediate medical attention for weakness, numbness, difficulty speaking or swallowing or worsening of condition.     Increase activity slowly    Complete by:  As directed             Current Discharge Medication List    START taking these medications   Details  clopidogrel (PLAVIX) 75 MG tablet Take 1 tablet (75 mg total) by mouth daily with breakfast. Qty: 30 tablet, Refills: 0    folic acid (FOLVITE) 1 MG tablet Take 1 tablet (1 mg total) by mouth daily. Qty: 30 tablet, Refills: 0      CONTINUE these medications which have CHANGED   Details  aspirin 325 MG tablet Take 1 tablet (325 mg total) by mouth daily.      CONTINUE these medications which have NOT CHANGED   Details  baclofen (LIORESAL) 10 MG tablet Take 1 tablet (10 mg total) by mouth 3 (three) times daily. As needed for muscle spasm Qty: 50 tablet, Refills: 0    Cholecalciferol (VITAMIN D) 400 UNITS capsule Take 400 Units by mouth daily.      Cyanocobalamin (VITAMIN B12 PO) Take by mouth daily.      glyBURIDE micronized (GLYNASE) 6 MG tablet Take 1 tablet (6 mg total) by mouth 2 (two) times daily. Qty: 180 tablet, Refills: 1    lisinopril (PRINIVIL,ZESTRIL) 2.5 MG tablet TAKE 1 TABLET BY MOUTH EVERY MORNING. Qty: 90 tablet, Refills: 1    meclizine (ANTIVERT) 25 MG tablet Take 1 tablet (25 mg total) by mouth 3 (three) times daily as needed for dizziness. Qty: 30 tablet, Refills: 3    meloxicam (MOBIC) 15 MG tablet Take 15 mg by mouth daily.    metFORMIN (GLUCOPHAGE) 500 MG tablet Take 2 tablets (1,000 mg total) by mouth 2 (two) times daily with a meal. Qty: 360 tablet, Refills: 1     pravastatin (PRAVACHOL) 20 MG tablet TAKE 1 TABLET BY MOUTH EVERY MORNING. Qty: 90 tablet, Refills: 1    pregabalin (LYRICA) 100 MG capsule Take 1 capsule (100 mg total) by mouth 3 (three) times daily. Qty: 270 capsule, Refills: 1    topiramate (TOPAMAX) 25 MG tablet Take 2 tablets (50 mg total) by mouth 2 (two) times daily. Qty: 120 tablet, Refills: 1   Associated Diagnoses: Vertigo    traMADol (ULTRAM) 50 MG tablet Take 50 mg by mouth 4 (four) times daily.  Refills: 0    valACYclovir (VALTREX) 1000 MG tablet Take 1,000 mg by mouth daily.  Refills: 3       No Known Allergies  The results of significant diagnostics from this hospitalization (including imaging, microbiology, ancillary and laboratory) are listed below for reference.    Significant Diagnostic Studies: Ct Angio Head W/cm &/or Wo Cm  02/28/2015  CLINICAL DATA:  Slurred speech, weakness, double vision and unable to walk. EXAM: CT ANGIOGRAPHY HEAD  AND NECK TECHNIQUE: Multidetector CT imaging of the head and neck was performed using the standard protocol during bolus administration of intravenous contrast. Multiplanar CT image reconstructions and MIPs were obtained to evaluate the vascular anatomy. Carotid stenosis measurements (when applicable) are obtained utilizing NASCET criteria, using the distal internal carotid diameter as the denominator. CONTRAST:  18mL OMNIPAQUE IOHEXOL 350 MG/ML SOLN COMPARISON:  None. CT head without contrast from the same day. FINDINGS: CT HEAD Brain: Source images demonstrate the remote left caudate head. The insular ribbon and basal ganglia are otherwise appear to be intact. No definite cortical infarct is present. The ventricles are of normal size. Ex vacuo dilation of the left frontal horn is noted. No significant extra-axial fluid collection is present. Calvarium and skull base: Intact Paranasal sinuses: Minimal mucosal thickening is present along the medial left maxillary sinus. The sinuses are  otherwise clear. Orbits: Within normal limits CTA NECK Aortic arch: A 3 vessel arch configuration is present. No significant atherosclerotic disease or stenosis is present. Right carotid system: The right common carotid artery is within normal limits. The bifurcation is unremarkable. The cervical right ICA is normal. Left carotid system: The left common carotid artery is within normal limits. The bifurcation is unremarkable. The cervical left ICA is normal. Vertebral arteries:Both vertebral arteries originate from the subclavian arteries. The right vertebral artery is the dominant vessel. There are no significant stenoses at the vertebral artery origins. The vertebral arteries are within normal limits throughout the neck. Skeleton: Anterior cervical fusion is evident at C5-6. Chronic endplate changes are present at C3-4 and C4-5 with osseous foraminal narrowing bilaterally at these levels. No focal lytic or blastic lesions are present. Other neck: No focal mucosal or submucosal lesions are present. The thyroid is within normal limits. There is no significant cervical adenopathy. The salivary glands are unremarkable. The lung apices are clear. The visualized mediastinum is unremarkable. CTA HEAD Anterior circulation: The internal carotid arteries are within normal limits through the skullbase to the ICA termini bilaterally. The A1 and M1 segments are normal. Anterior communicating artery is patent. The MCA bifurcations are intact. MCA branch vessels are less robust on the left, particularly posteriorly and inferiorly compared to the right. Collaterals are somewhat increased on the left. Posterior circulation: The right vertebral artery is the dominant vessel. The left PICA origin is visualized and normal. The right AICA is dominant. The basilar artery is within normal limits. Both posterior cerebral arteries originate from the basilar tip. The PCA branch vessels are intact. Venous sinuses: The dural sinuses are  patent. The straight sinus and deep cerebral veins are intact. Cortical veins are unremarkable. Anatomic variants: None. Delayed phase: The postcontrast images demonstrate no pathologic enhancement. IMPRESSION: 1. Decreased caliber and increased prominence of collaterals within the posterior left MCA distribution raise concern for a developing infarct in this area versus chronic stenosis. 2. No significant proximal stenosis, aneurysm, or branch vessel occlusion. 3. The cervical vasculature is within normal limits. 4. Remote lacunar infarct of the left caudate head. 5. Cervical spondylosis as described. Electronically Signed   By: San Morelle M.D.   On: 02/28/2015 10:15   Ct Head Wo Contrast  02/28/2015  CLINICAL DATA:  Slurred speech. EXAM: CT HEAD WITHOUT CONTRAST TECHNIQUE: Contiguous axial images were obtained from the base of the skull through the vertex without intravenous contrast. COMPARISON:  CT scan of October 14, 2014. FINDINGS: Bony calvarium appears intact. Old infarction involving the left caudate nucleus is again noted. Stable old infarctions  involving both cerebellar hemispheres are noted as well. No mass effect or midline shift is noted. Ventricular size is within normal limits. There is no evidence of mass lesion, hemorrhage or acute infarction. IMPRESSION: Stable old left caudate nucleus and bilateral cerebellar infarctions. No acute intracranial abnormality seen. These results were called by telephone at the time of interpretation on 02/28/2015 at 8:06 am to Dr. Ezequiel Essex , who verbally acknowledged these results. Electronically Signed   By: Marijo Conception, M.D.   On: 02/28/2015 08:06   Ct Angio Neck W/cm &/or Wo/cm  02/28/2015  CLINICAL DATA:  Slurred speech, weakness, double vision and unable to walk. EXAM: CT ANGIOGRAPHY HEAD AND NECK TECHNIQUE: Multidetector CT imaging of the head and neck was performed using the standard protocol during bolus administration of intravenous  contrast. Multiplanar CT image reconstructions and MIPs were obtained to evaluate the vascular anatomy. Carotid stenosis measurements (when applicable) are obtained utilizing NASCET criteria, using the distal internal carotid diameter as the denominator. CONTRAST:  68mL OMNIPAQUE IOHEXOL 350 MG/ML SOLN COMPARISON:  None. CT head without contrast from the same day. FINDINGS: CT HEAD Brain: Source images demonstrate the remote left caudate head. The insular ribbon and basal ganglia are otherwise appear to be intact. No definite cortical infarct is present. The ventricles are of normal size. Ex vacuo dilation of the left frontal horn is noted. No significant extra-axial fluid collection is present. Calvarium and skull base: Intact Paranasal sinuses: Minimal mucosal thickening is present along the medial left maxillary sinus. The sinuses are otherwise clear. Orbits: Within normal limits CTA NECK Aortic arch: A 3 vessel arch configuration is present. No significant atherosclerotic disease or stenosis is present. Right carotid system: The right common carotid artery is within normal limits. The bifurcation is unremarkable. The cervical right ICA is normal. Left carotid system: The left common carotid artery is within normal limits. The bifurcation is unremarkable. The cervical left ICA is normal. Vertebral arteries:Both vertebral arteries originate from the subclavian arteries. The right vertebral artery is the dominant vessel. There are no significant stenoses at the vertebral artery origins. The vertebral arteries are within normal limits throughout the neck. Skeleton: Anterior cervical fusion is evident at C5-6. Chronic endplate changes are present at C3-4 and C4-5 with osseous foraminal narrowing bilaterally at these levels. No focal lytic or blastic lesions are present. Other neck: No focal mucosal or submucosal lesions are present. The thyroid is within normal limits. There is no significant cervical adenopathy. The  salivary glands are unremarkable. The lung apices are clear. The visualized mediastinum is unremarkable. CTA HEAD Anterior circulation: The internal carotid arteries are within normal limits through the skullbase to the ICA termini bilaterally. The A1 and M1 segments are normal. Anterior communicating artery is patent. The MCA bifurcations are intact. MCA branch vessels are less robust on the left, particularly posteriorly and inferiorly compared to the right. Collaterals are somewhat increased on the left. Posterior circulation: The right vertebral artery is the dominant vessel. The left PICA origin is visualized and normal. The right AICA is dominant. The basilar artery is within normal limits. Both posterior cerebral arteries originate from the basilar tip. The PCA branch vessels are intact. Venous sinuses: The dural sinuses are patent. The straight sinus and deep cerebral veins are intact. Cortical veins are unremarkable. Anatomic variants: None. Delayed phase: The postcontrast images demonstrate no pathologic enhancement. IMPRESSION: 1. Decreased caliber and increased prominence of collaterals within the posterior left MCA distribution raise concern for a developing  infarct in this area versus chronic stenosis. 2. No significant proximal stenosis, aneurysm, or branch vessel occlusion. 3. The cervical vasculature is within normal limits. 4. Remote lacunar infarct of the left caudate head. 5. Cervical spondylosis as described. Electronically Signed   By: San Morelle M.D.   On: 02/28/2015 10:15   Mr Brain Wo Contrast  02/28/2015  CLINICAL DATA:  54 year old male with severe dizziness, spot slurred speech, double vision, ataxia. Abnormal left MCA vasculature on CTA head and neck this morning. Initial encounter. EXAM: MRI HEAD WITHOUT CONTRAST TECHNIQUE: Multiplanar, multiecho pulse sequences of the brain and surrounding structures were obtained without intravenous contrast. COMPARISON:  CTA head and  neck 0914 hours today. Brain MRI 10/14/2014. FINDINGS: Major intracranial vascular flow voids are stable compared to the prior MRI. 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 There is a 15 mm focus of confluent restricted diffusion in the right superior cerebellum just to the right of midline (series 100, image 16 and series 101, image 26. Associated mild T2 and FLAIR hyperintensity. No associated hemorrhage or mass effect. There is a smaller, subtle focus of restricted diffusion in the central right cerebellar hemisphere (series 100, image 12). There are underlying bilateral chronic cerebellar lacunar infarcts, greater on the left. No brainstem or other restricted diffusion. Chronic left caudate and right anterior MCA division infarcts, the latter with mild cortical encephalomalacia. Stable supratentorial gray and white matter signal. No midline shift, mass effect, evidence of mass lesion, ventriculomegaly, extra-axial collection or acute intracranial hemorrhage. Cervicomedullary junction and pituitary are within normal limits. Negative visualized cervical spine. Visible internal auditory structures appear normal. Mastoids are clear. Stable mild paranasal sinus mucosal thickening. Negative orbit and scalp soft tissues. Normal bone marrow signal. IMPRESSION: 1. Small acute right cerebellar infarcts, most affecting the right SCA territory. No hemorrhage or mass effect. 2. Underlying chronic bilateral cerebellar infarcts, and small bilateral MCA infarcts. Electronically Signed   By: Genevie Ann M.D.   On: 02/28/2015 12:01    Microbiology: Recent Results (from the past 240 hour(s))  MRSA PCR Screening     Status: None   Collection Time: 02/28/15 12:30 PM  Result Value Ref Range Status   MRSA by PCR NEGATIVE NEGATIVE Final    Comment:        The GeneXpert MRSA Assay (FDA approved for NASAL specimens only), is one component of a comprehensive MRSA colonization surveillance program. It  is not intended to diagnose MRSA infection nor to guide or monitor treatment for MRSA infections.      Labs: Basic Metabolic Panel:  Recent Labs Lab 02/28/15 0755 02/28/15 0807  NA 141 143  K 4.3 4.3  CL 113* 108  CO2 24  --   GLUCOSE 125* 121*  BUN 24* 22*  CREATININE 1.13 1.10  CALCIUM 8.8*  --    Liver Function Tests:  Recent Labs Lab 02/28/15 0755  AST 16  ALT 10*  ALKPHOS 50  BILITOT 0.6  PROT 6.4*  ALBUMIN 3.9   CBC:  Recent Labs Lab 02/28/15 0755 02/28/15 0807  WBC 4.7  --   NEUTROABS 2.8  --   HGB 13.6 13.3  HCT 39.1 39.0  MCV 95.8  --   PLT 147*  --    CBG:  Recent Labs Lab 03/02/15 2206 03/03/15 0736 03/03/15 1144 03/03/15 1408 03/03/15 1636  GLUCAP 143* 128* 117* 91 86    Principal Problem:   Cerebellar stroke (HCC) Active Problems:   Essential hypertension  HLD (hyperlipidemia)   Time coordinating discharge: 35 minutes   Signed:  Murray Hodgkins, MD [facility] 03/03/2015, 7:11 AM   By signing my name below, I, Rennis Harding attest that this documentation has been prepared under the direction and in the presence of Murray Hodgkins, MD Electronically signed: Rennis Harding  03/03/2015   I personally performed the services described in this documentation. All medical record entries made by the scribe were at my direction. I have reviewed the chart and agree that the record reflects my personal performance and is accurate and complete. Murray Hodgkins, MD

## 2015-03-03 NOTE — H&P (Addendum)
Procedure H&P  Please refer to admission H&P for full history. In brief patient is 54 yo male admitted with CVA. A TEE has been requested by the primary team and neurology for evaluation for possible cardioembolic source. We will plan for TEE with Dr Bronson Ing today at 2pm under moderate sedation. The patient has been NPO since midnight, has no significant difficultly with swallowing post stroke.   Zandra Abts MD   Referring physician: Ezequiel Essex, MD PCP: [name], MD   Chief Complaint: Acute CVA  HPI:  34 yom PMH of HLD, HTN, and DM presented with right sided numbness, weakness, slurred speech, and a HA. While in the ED, head CT and labs were unremarkable. CTA of the head and neck revealed decreased caliber and increased prominence of collaterals within the posterior left MCA distribution, concerning for a developing infarct in this area versus chronic stenosis as well as a remote lacunar infarct of the left caudate head. Patient was seen by teleneurology and it was agreed upon that he was not a candidate for tPA given he was last seen well at 3am (about 5 hours prior to his arrival to the ED). EDP discussed with radiology and with Dr. Doy Mince neurology and patient was not felt to be a candidate for intervention and transfer to [facility] was not recommended. He was admitted for further management and stroke workup.   Patient hospitalized 10/2014 for dizziness with negative MRI, concern was raised for complex partial seizures. He has been followed by Dr. Erlinda Hong as an outpatient for vertigo spells and treated with topamax for headache prevention, ASA 81mg  and pravastatin.   Patient reports he woke up this morning and felt dizzy upon getting out of bed. He then attempted to ambulate but felt very unsteady and started vomiting. Per wife, he was stumbling and could not stand straight. Patient complains of tingling to his right side and a headache but denies any numbness, weakness, fever, sore throat,  rash, diarrhea, or abdominal pain.   Patient takes 81mg  ASA daily.   In the emergency department VSS, afebrile, not hypoxic Pertinent labs: CBC unremarkable, BMP and LFTs unremarkable, troponin negative EKG: Independently reviewed. SR, no acute changes Imaging:  MRI of the brain : IMPRESSION: Small acute right cerebellar infarcts, most affecting the right SCA territory. No hemorrhage or mass effect. Underlying chronic bilateral cerebellar infarcts, and small bilateral MCA infarcts.  CTA head and neck: IMPRESSION: 1. Decreased caliber and increased prominence of collaterals within the posterior left MCA distribution raise concern for a developing infarct in this area versus chronic stenosis. 2. No significant proximal stenosis, aneurysm, or Elwin Tsou vessel occlusion. 3. The cervical vasculature is within normal limits. 4. Remote lacunar infarct of the left caudate head. 5. Cervical spondylosis as described.  CT head: Unremarkable  Review of Systems:  Positive for dizziness, headache, nausea, and vomiting.  Negative for fever, visual changes, sore throat, rash, new muscle aches, chest pain, SOB, dysuria, bleeding, abdominal pain.  Past Medical History  Diagnosis Date  . Night sweats     every once in a while  . Hyperlipidemia   . Diabetes mellitus   . Hernia, inguinal   . Dizziness   . Chronic headaches   . Arthritis   . Neuropathy of lower extremity   . Diabetic peripheral neuropathy ([place]) 03/28/2013  . Diverticulosis   . Hypertension   . Complication of anesthesia     bleed after last shoulder-had to stay overnight  . Impingement syndrome of left shoulder  08/02/2014  . 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 Multi-infarct state ([place]) 10/14/2014  . Depression   . Anxiety     Past Surgical History  Procedure Laterality Date  . Inguinal hernia repair Bilateral   . Cervical disc  surgery    . Wrist surgery Right     fusion  . Shoulder arthroscopy Right     1  . Knee arthroscopy Right   . Colon surgery  2003    1/3 removed for diverticulitis  . Colonoscopy    . Umbilical hernia repair      with other hernia repair with mesh  . Shoulder arthroscopy Left 08/02/2014    Procedure: LEFT SHOULDER SCOPE DEBRIDEMENT/ACROMIOPLASTY; Surgeon: [name], MD; Location: [place]; Service: Orthopedics; Laterality: Left; ANESTHESIA: GENERAL, PRE/POST OP SCALENE    Social History:  reports that he quit smoking about 36 years ago. His smoking use included Cigarettes. He started smoking about 44 years ago. He has a 8 pack-year smoking history. He quit smokeless tobacco use about 36 years ago. His smokeless tobacco use included Chew. He reports that he drinks about 0.6 oz of alcohol per week. He reports that he does not use illicit drugs. lives with their spouse Self-care  No Known Allergies  Family History  Problem Relation Age of Onset  . Stroke Father   . Hyperlipidemia Father   . Diabetes Maternal Grandfather   . Heart attack Sister 26  . ALS Brother 19  . Dementia Mother      Prior to Admission medications   Medication Sig Start Date End Date Taking? Authorizing Provider  aspirin 81 MG tablet Take 2 tablets (162 mg total) by mouth daily. 10/16/14  Yes Kathie Dike, MD  baclofen (LIORESAL) 10 MG tablet Take 1 tablet (10 mg total) by mouth 3 (three) times daily. As needed for muscle spasm 08/02/14  Yes [name], MD  Cholecalciferol (VITAMIN D) 400 UNITS capsule Take 400 Units by mouth daily.    Yes Historical Provider, MD  Cyanocobalamin (VITAMIN B12 PO) Take by mouth daily.    Yes Historical Provider, MD  glyBURIDE micronized (GLYNASE) 6 MG tablet Take 1 tablet (6 mg total) by mouth 2 (two) times daily. 06/13/14  Yes Kathyrn Drown, MD  lisinopril (PRINIVIL,ZESTRIL) 2.5 MG tablet TAKE 1 TABLET BY MOUTH EVERY MORNING. 02/20/15  Yes Kathyrn Drown, MD  meclizine (ANTIVERT) 25 MG tablet Take 1 tablet (25 mg total) by mouth 3 (three) times daily as needed for dizziness. 12/02/14  Yes Kathyrn Drown, MD  meloxicam (MOBIC) 15 MG tablet Take 15 mg by mouth daily.   Yes Historical Provider, MD  metFORMIN (GLUCOPHAGE) 500 MG tablet Take 2 tablets (1,000 mg total) by mouth 2 (two) times daily with a meal. 10/18/14  Yes Kathyrn Drown, MD  pravastatin (PRAVACHOL) 20 MG tablet TAKE 1 TABLET BY MOUTH EVERY MORNING. 02/20/15  Yes Kathyrn Drown, MD  pregabalin (LYRICA) 100 MG capsule Take 1 capsule (100 mg total) by mouth 3 (three) times daily. 12/27/14  Yes Kathyrn Drown, MD  topiramate (TOPAMAX) 25 MG tablet Take 2 tablets (50 mg total) by mouth 2 (two) times daily. 12/27/14  Yes Rosalin Hawking, MD  traMADol (ULTRAM) 50 MG tablet Take 50 mg by mouth 4 (four) times daily.  01/09/15  Yes Historical Provider, MD  valACYclovir (VALTREX) 1000 MG tablet Take 1,000 mg by mouth daily.  12/26/14  Yes Historical Provider, MD   Physical Exam:  Filed Vitals:   02/28/15 0933 02/28/15 1000 02/28/15 1030 02/28/15 1045  BP:  140/86 137/90   Pulse:  52 64 64  Temp: 97.7 F (36.5 C)     TempSrc:      Resp:  13 15 15   Height:      Weight:      SpO2:  96% 96% 96%    VSS, afebrile, not hypoxic  General: Appears calm and comfortable  Eyes: PERRL, normal lids, irises   ENT: grossly normal hearing, lips & tongue  Neck: no LAD, masses or thyromegaly  Cardiovascular: RRR, no m/r/g. No LE edema.  Respiratory: CTA bilaterally, no w/r/r. Normal respiratory effort.  Abdomen: soft, ntnd  Skin: no rash or induration seen   Musculoskeletal: grossly normal tone BUE/BLE  Psychiatric: grossly normal mood and affect, speech fluent and slightly  dysarthric. Oriented to self, location, month, and year.   Neurologic: CN 2-12 intact, strength BLE 5/5 symmetric. Left 4/5, right 5/5. Pass-pointing in the RUE, indeterminate pronator drift    Wt Readings from Last 3 Encounters:  02/28/15 83.915 kg (185 lb)  02/28/15 83.915 kg (185 lb)  02/13/15 83.915 kg (185 lb)    Labs on Admission:  Basic Metabolic Panel:  Last Labs      Recent Labs Lab 02/28/15 0755 02/28/15 0807  NA 141 143  K 4.3 4.3  CL 113* 108  CO2 24 --   GLUCOSE 125* 121*  BUN 24* 22*  CREATININE 1.13 1.10  CALCIUM 8.8* --       Liver Function Tests:  Last Labs      Recent Labs Lab 02/28/15 0755  AST 16  ALT 10*  ALKPHOS 50  BILITOT 0.6  PROT 6.4*  ALBUMIN 3.9      CBC:  Last Labs      Recent Labs Lab 02/28/15 0755 02/28/15 0807  WBC 4.7 --   NEUTROABS 2.8 --   HGB 13.6 13.3  HCT 39.1 39.0  MCV 95.8 --   PLT 147* --       Troponin (Point of Care Test)  Recent Labs (last 2 labs)      Recent Labs  02/28/15 0804  TROPIPOC 0.00       Radiological Exams on Admission:  Imaging Results (Last 48 hours)    Ct Angio Head W/cm &/or Wo Cm  02/28/2015 CLINICAL DATA: Slurred speech, weakness, double vision and unable to walk. EXAM: CT ANGIOGRAPHY HEAD AND NECK TECHNIQUE: Multidetector CT imaging of the head and neck was performed using the standard protocol during bolus administration of intravenous contrast. Multiplanar CT image reconstructions and MIPs were obtained to evaluate the vascular anatomy. Carotid stenosis measurements (when applicable) are obtained utilizing NASCET criteria, using the distal internal carotid diameter as the denominator. CONTRAST: 80mL OMNIPAQUE IOHEXOL 350 MG/ML SOLN COMPARISON: None. CT head without contrast from the same day. FINDINGS: CT HEAD Brain: Source images demonstrate the remote left caudate head. The insular  ribbon and basal ganglia are otherwise appear to be intact. No definite cortical infarct is present. The ventricles are of normal size. Ex vacuo dilation of the left frontal horn is noted. No significant extra-axial fluid collection is present. Calvarium and skull base: Intact Paranasal sinuses: Minimal mucosal thickening is present along the medial left maxillary sinus. The sinuses are otherwise clear. Orbits: Within normal limits CTA NECK Aortic arch: A 3 vessel arch configuration is present. No significant atherosclerotic disease or stenosis is present. Right carotid system: The right common carotid artery  is within normal limits. The bifurcation is unremarkable. The cervical right ICA is normal. Left carotid system: The left common carotid artery is within normal limits. The bifurcation is unremarkable. The cervical left ICA is normal. Vertebral arteries:Both vertebral arteries originate from the subclavian arteries. The right vertebral artery is the dominant vessel. There are no significant stenoses at the vertebral artery origins. The vertebral arteries are within normal limits throughout the neck. Skeleton: Anterior cervical fusion is evident at C5-6. Chronic endplate changes are present at C3-4 and C4-5 with osseous foraminal narrowing bilaterally at these levels. No focal lytic or blastic lesions are present. Other neck: No focal mucosal or submucosal lesions are present. The thyroid is within normal limits. There is no significant cervical adenopathy. The salivary glands are unremarkable. The lung apices are clear. The visualized mediastinum is unremarkable. CTA HEAD Anterior circulation: The internal carotid arteries are within normal limits through the skullbase to the ICA termini bilaterally. The A1 and M1 segments are normal. Anterior communicating artery is patent. The MCA bifurcations are intact. MCA Belva Koziel vessels are less robust on the left, particularly posteriorly and inferiorly compared to the  right. Collaterals are somewhat increased on the left. Posterior circulation: The right vertebral artery is the dominant vessel. The left PICA origin is visualized and normal. The right AICA is dominant. The basilar artery is within normal limits. Both posterior cerebral arteries originate from the basilar tip. The PCA Safiyyah Vasconez vessels are intact. Venous sinuses: The dural sinuses are patent. The straight sinus and deep cerebral veins are intact. Cortical veins are unremarkable. Anatomic variants: None. Delayed phase: The postcontrast images demonstrate no pathologic enhancement. IMPRESSION: 1. Decreased caliber and increased prominence of collaterals within the posterior left MCA distribution raise concern for a developing infarct in this area versus chronic stenosis. 2. No significant proximal stenosis, aneurysm, or Yitzhak Awan vessel occlusion. 3. The cervical vasculature is within normal limits. 4. Remote lacunar infarct of the left caudate head. 5. Cervical spondylosis as described. Electronically Signed By: San Morelle M.D. On: 02/28/2015 10:15   Ct Head Wo Contrast  02/28/2015 CLINICAL DATA: Slurred speech. EXAM: CT HEAD WITHOUT CONTRAST TECHNIQUE: Contiguous axial images were obtained from the base of the skull through the vertex without intravenous contrast. COMPARISON: CT scan of October 14, 2014. FINDINGS: Bony calvarium appears intact. Old infarction involving the left caudate nucleus is again noted. Stable old infarctions involving both cerebellar hemispheres are noted as well. No mass effect or midline shift is noted. Ventricular size is within normal limits. There is no evidence of mass lesion, hemorrhage or acute infarction. IMPRESSION: Stable old left caudate nucleus and bilateral cerebellar infarctions. No acute intracranial abnormality seen. These results were called by telephone at the time of interpretation on 02/28/2015 at 8:06 am to Dr. Ezequiel Essex , who verbally acknowledged  these results. Electronically Signed By: Marijo Conception, M.D. On: 02/28/2015 08:06   Ct Angio Neck W/cm &/or Wo/cm  02/28/2015 CLINICAL DATA: Slurred speech, weakness, double vision and unable to walk. EXAM: CT ANGIOGRAPHY HEAD AND NECK TECHNIQUE: Multidetector CT imaging of the head and neck was performed using the standard protocol during bolus administration of intravenous contrast. Multiplanar CT image reconstructions and MIPs were obtained to evaluate the vascular anatomy. Carotid stenosis measurements (when applicable) are obtained utilizing NASCET criteria, using the distal internal carotid diameter as the denominator. CONTRAST: 64mL OMNIPAQUE IOHEXOL 350 MG/ML SOLN COMPARISON: None. CT head without contrast from the same day. FINDINGS: CT HEAD Brain: Source images demonstrate the  remote left caudate head. The insular ribbon and basal ganglia are otherwise appear to be intact. No definite cortical infarct is present. The ventricles are of normal size. Ex vacuo dilation of the left frontal horn is noted. No significant extra-axial fluid collection is present. Calvarium and skull base: Intact Paranasal sinuses: Minimal mucosal thickening is present along the medial left maxillary sinus. The sinuses are otherwise clear. Orbits: Within normal limits CTA NECK Aortic arch: A 3 vessel arch configuration is present. No significant atherosclerotic disease or stenosis is present. Right carotid system: The right common carotid artery is within normal limits. The bifurcation is unremarkable. The cervical right ICA is normal. Left carotid system: The left common carotid artery is within normal limits. The bifurcation is unremarkable. The cervical left ICA is normal. Vertebral arteries:Both vertebral arteries originate from the subclavian arteries. The right vertebral artery is the dominant vessel. There are no significant stenoses at the vertebral artery origins. The vertebral arteries are within normal  limits throughout the neck. Skeleton: Anterior cervical fusion is evident at C5-6. Chronic endplate changes are present at C3-4 and C4-5 with osseous foraminal narrowing bilaterally at these levels. No focal lytic or blastic lesions are present. Other neck: No focal mucosal or submucosal lesions are present. The thyroid is within normal limits. There is no significant cervical adenopathy. The salivary glands are unremarkable. The lung apices are clear. The visualized mediastinum is unremarkable. CTA HEAD Anterior circulation: The internal carotid arteries are within normal limits through the skullbase to the ICA termini bilaterally. The A1 and M1 segments are normal. Anterior communicating artery is patent. The MCA bifurcations are intact. MCA Arrian Manson vessels are less robust on the left, particularly posteriorly and inferiorly compared to the right. Collaterals are somewhat increased on the left. Posterior circulation: The right vertebral artery is the dominant vessel. The left PICA origin is visualized and normal. The right AICA is dominant. The basilar artery is within normal limits. Both posterior cerebral arteries originate from the basilar tip. The PCA Deandra Goering vessels are intact. Venous sinuses: The dural sinuses are patent. The straight sinus and deep cerebral veins are intact. Cortical veins are unremarkable. Anatomic variants: None. Delayed phase: The postcontrast images demonstrate no pathologic enhancement. IMPRESSION: 1. Decreased caliber and increased prominence of collaterals within the posterior left MCA distribution raise concern for a developing infarct in this area versus chronic stenosis. 2. No significant proximal stenosis, aneurysm, or Santasia Rew vessel occlusion. 3. The cervical vasculature is within normal limits. 4. Remote lacunar infarct of the left caudate head. 5. Cervical spondylosis as described. Electronically Signed By: San Morelle M.D. On: 02/28/2015 10:15   Mr Brain Wo  Contrast  02/28/2015 CLINICAL DATA: 54 year old male with severe dizziness, spot slurred speech, double vision, ataxia. Abnormal left MCA vasculature on CTA head and neck this morning. Initial encounter. EXAM: MRI HEAD WITHOUT CONTRAST TECHNIQUE: Multiplanar, multiecho pulse sequences of the brain and surrounding structures were obtained without intravenous contrast. COMPARISON: CTA head and neck 0914 hours today. Brain MRI 10/14/2014. FINDINGS: Major intracranial vascular flow voids are stable compared to the prior MRI. 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 There is a 15 mm focus of confluent restricted diffusion in the right superior cerebellum just to the right of midline (series 100, image 16 and series 101, image 26. Associated mild T2 and FLAIR hyperintensity. No associated hemorrhage or mass effect. There is a smaller, subtle focus of restricted diffusion in the central right cerebellar hemisphere (series 100, image 12). There  are underlying bilateral chronic cerebellar lacunar infarcts, greater on the left. No brainstem or other restricted diffusion. Chronic left caudate and right anterior MCA division infarcts, the latter with mild cortical encephalomalacia. Stable supratentorial gray and white matter signal. No midline shift, mass effect, evidence of mass lesion, ventriculomegaly, extra-axial collection or acute intracranial hemorrhage. Cervicomedullary junction and pituitary are within normal limits. Negative visualized cervical spine. Visible internal auditory structures appear normal. Mastoids are clear. Stable mild paranasal sinus mucosal thickening. Negative orbit and scalp soft tissues. Normal bone marrow signal. IMPRESSION: 1. Small acute right cerebellar infarcts, most affecting the right SCA territory. No hemorrhage or mass effect. 2. Underlying chronic bilateral cerebellar infarcts, and small bilateral MCA infarcts. Electronically Signed By: Genevie Ann M.D.  On: 02/28/2015 12:01       Principal Problem:  Cerebellar stroke [facility]) Active Problems:  Essential hypertension  HLD (hyperlipidemia)   Assessment/Plan 1. Small acute right cerebellar infarcts with RUE pass pointing and dysarthria. Neurology, PT, OT, and ST consulted. Will order echo. 2. DM type 2, stable. Continue SSI and monitor 3. Essential HTN, stable.  4. HLD, continue statin.   Admit to SDU. Complete stroke evaluation.  Follow up echo  Continue ASA and low-dose statin.  Follow up neurology, PT, OT, and ST recommendations.  Code Status: Full DVT prophylaxis:Lovenox Family Communication: Wife at bedside. Discussed with patient who understands and has no concerns at this time. Disposition Plan/Anticipated LOS: Admit to SDU  Time spent: 50 minutes  Murray Hodgkins, MD [facility] Pager [phone] 02/28/2015, 12:13 PM    By signing my name below, I, Rosalie Doctor attest that this documentation has been prepared under the direction and in the presence of Murray Hodgkins, MD Electronically signed: Rosalie Doctor, Scribe. 02/28/2015 1:01pm   I personally performed the services described in this documentation. All medical record entries made by the scribe were at my direction. I have reviewed the chart and agree that the record reflects my personal performance and is accurate and complete. Murray Hodgkins, MD

## 2015-03-03 NOTE — Progress Notes (Signed)
Patient at procedure or test/unavailable. ST to follow up for SLE.   Arvil Chaco MA, [facility] Speech Language Pathologist

## 2015-03-03 NOTE — Care Management Note (Signed)
Case Management Note  Patient Details  Name: Travis [name] MRN: [id number] Date of Birth: 1961-04-27  Subjective/Objective:                  Pt admitted from home with CVA. Pt lives with his wife and will return home at discharge. Pt has been fairly independent with ADL's prior to admission.  Action/Plan: PT recommends HH PT/OT at discharge and rolling walker, Pts wife chooses [name] for [facility] and DME. Juliann Pulse with [facility] is aware and will collect the pts information from the chart. [place] services to start within 48 hours of discharge. Pts rolling walker to be delivered to pts room prior to discharge. Anticipate discharge within 24 hours.   Expected Discharge Date:                  Expected Discharge Plan:  [place]  In-House Referral:  NA  Discharge planning Services  CM Consult  Post Acute Care Choice:  Durable Medical Equipment, Home Health Choice offered to:  Spouse  DME Arranged:  Walker rolling DME Agency:  [place] Arranged:  PT, OT [facility] Agency:  [place]  Status of Service:  Completed, signed off  Medicare Important Message Given:    Date Medicare IM Given:    Medicare IM give by:    Date Additional Medicare IM Given:    Additional Medicare Important Message give by:     If discussed at [place] of Stay Meetings, dates discussed:    Additional Comments:  Joylene Draft, RN 03/03/2015, 12:49 PM

## 2015-03-03 NOTE — Telephone Encounter (Signed)
Telephone order Dr Sarajane Jews, 30 day event for CVA   Will enroll pt in [employer]

## 2015-03-03 NOTE — Evaluation (Signed)
Occupational Therapy Evaluation Patient Details Name: Travis [name] MRN: [id number] DOB: 1961-01-22 Today's Date: 03/03/2015    History of Present Illness 54 y/o male PMH of HLD, HTN, and DM presented with right sided numbness, weakness, slurred speech, and a HA. While in the ED, head CT and labs were unremarkable. CTA of the head and neck revealed decreased caliber and increased prominence of collaterals within the posterior left MCA distribution, concerning for a developing infarct in this area versus chronic stenosis as well as a remote lacunar infarct of the left caudate head. Patient was seen by teleneurology and it was agreed upon that he was not a candidate for tPA given he was last seen well at 3am (about 5 hours prior to his arrival to the ED). EDP discussed with radiology and with Dr. Doy Mince neurology and patient was not felt to be a candidate for intervention and transfer to [facility] was not recommended. He was admitted for further management and stroke workup.    Clinical Impression   Patient is well known to OT as he was previously seen as an outpatient patient. Patient's initial symptoms seem to be improving. Patient presents with slight ataxia during gross motor activities as well as decreased gross grasp. PT had recommended possible CIR although patient appears to be too high functioning to qualify. Recommend Home health OT followed by Outpatient OT if needed to work on coordination and strength.    Follow Up Recommendations  Home health OT    Equipment Recommendations  None recommended by OT       Precautions / Restrictions Precautions Precautions: Fall Restrictions Weight Bearing Restrictions: No      Mobility Bed Mobility Overal bed mobility: Modified Independent             General bed mobility comments: Patient used bed rails for supine to sit.  Transfers Overall transfer level: Needs assistance Equipment used: None Transfers: Sit to/from Stand Sit to  Stand: Min assist                   ADL Overall ADL's : Needs assistance/impaired     Grooming: Min guard;Standing;Wash/dry hands               Lower Body Dressing: Supervision/safety;Sit to/from stand   Toilet Transfer: Minimal assistance;Ambulation             General ADL Comments: Pt ambulates from bed to sink with an ataxic gait pattern. Unsteady and cautious when ambulationg.      Vision Vision Assessment?: Yes Eye Alignment: Within Functional Limits Ocular Range of Motion: Within Functional Limits Alignment/Gaze Preference: Within Defined Limits Tracking/Visual Pursuits: Able to track stimulus in all quads without difficulty Saccades: Within functional limits Visual Fields: No apparent deficits Depth Perception: Undershoots   Perception Perception Perception Tested?: Yes Spatial deficits: WFL       Pertinent Vitals/Pain Pain Assessment: 0-10 Pain Score: 5  Pain Location: Left hip and head Pain Descriptors / Indicators: Aching Pain Intervention(s): Other (comment) (Pt did not request pain intervention)     Hand Dominance Right   Extremity/Trunk Assessment Upper Extremity Assessment Upper Extremity Assessment: RUE deficits/detail RUE Deficits / Details: Slight ataxia movement noted with gross motor activities. Slight decreased gross grasp.  RUE Coordination: decreased gross motor   Lower Extremity Assessment Lower Extremity Assessment: Defer to PT evaluation       Communication Communication Communication: No difficulties   Cognition Arousal/Alertness: Awake/alert Behavior During Therapy: WFL for tasks assessed/performed Overall Cognitive Status:  Within Functional Limits for tasks assessed                                Home Living Family/patient expects to be discharged to:: Private residence Living Arrangements: Spouse/significant other Available Help at Discharge: Family Type of Home: House       Home Layout: One  level     Bathroom Shower/Tub: Tub/shower unit;Walk-in shower (Pt typically uses walk-in although he states he will use the tub at home. )   Bathroom Toilet: Standard     Home Equipment: None      Lives With: Spouse;Daughter    Prior Functioning/Environment Level of Independence: Independent        Comments: Pt was working a full time job at a [employer].    OT Diagnosis: Generalized weakness   OT Problem List: Decreased strength;Decreased coordination;Impaired balance (sitting and/or standing)            Barriers to D/C:  None             End of Session Equipment Utilized During Treatment: Gait belt  Activity Tolerance: Patient tolerated treatment well Patient left: in bed;with nursing/sitter in room   Time: 0820-0845 OT Time Calculation (min): 25 min Charges:  OT General Charges $OT Visit: 1 Procedure OT Evaluation $Initial OT Evaluation Tier I: 1 Procedure G-Codes:    Ailene Ravel, OTR/L,CBIS  [phone]  03/03/2015, 9:36 AM

## 2015-03-04 ENCOUNTER — Telehealth: Payer: Self-pay | Admitting: Family Medicine

## 2015-03-04 DIAGNOSIS — I639 Cerebral infarction, unspecified: Secondary | ICD-10-CM

## 2015-03-04 NOTE — Telephone Encounter (Signed)
Travis [name] has recently been discharged from the hospital due to a stroke.  He has an order in the system for Physical therapy, speech therapy and occupational therapy to Travis [name].  Unfortunately, if he does his therapy through this company, he risks running up a very expensive bill that he cannot afford.  Travis [name] wants to know if Travis [name] can go in the system and change the orders for him to go to the [facility] here in [place] so his therapy can be covered at 100%.  They can see him tomorrow if they receive the order today.  Please call Travis [name] once this has been done.

## 2015-03-04 NOTE — Telephone Encounter (Signed)
Fine to switch , pt does need to follow up here as well in Nov

## 2015-03-04 NOTE — Telephone Encounter (Signed)
Orders changed. tammy notified on voicemail.

## 2015-03-05 ENCOUNTER — Ambulatory Visit (HOSPITAL_COMMUNITY): Payer: 59 | Attending: Orthopedic Surgery | Admitting: Physical Therapy

## 2015-03-05 DIAGNOSIS — R471 Dysarthria and anarthria: Secondary | ICD-10-CM | POA: Insufficient documentation

## 2015-03-05 DIAGNOSIS — I69919 Unspecified symptoms and signs involving cognitive functions following unspecified cerebrovascular disease: Secondary | ICD-10-CM | POA: Diagnosis present

## 2015-03-05 DIAGNOSIS — R279 Unspecified lack of coordination: Secondary | ICD-10-CM | POA: Insufficient documentation

## 2015-03-05 DIAGNOSIS — R29898 Other symptoms and signs involving the musculoskeletal system: Secondary | ICD-10-CM | POA: Diagnosis present

## 2015-03-05 DIAGNOSIS — R278 Other lack of coordination: Secondary | ICD-10-CM

## 2015-03-05 DIAGNOSIS — R2681 Unsteadiness on feet: Secondary | ICD-10-CM | POA: Insufficient documentation

## 2015-03-05 DIAGNOSIS — R262 Difficulty in walking, not elsewhere classified: Secondary | ICD-10-CM | POA: Insufficient documentation

## 2015-03-05 DIAGNOSIS — M6281 Muscle weakness (generalized): Secondary | ICD-10-CM | POA: Insufficient documentation

## 2015-03-05 DIAGNOSIS — R42 Dizziness and giddiness: Secondary | ICD-10-CM | POA: Diagnosis present

## 2015-03-05 DIAGNOSIS — R269 Unspecified abnormalities of gait and mobility: Secondary | ICD-10-CM | POA: Insufficient documentation

## 2015-03-05 DIAGNOSIS — Z9181 History of falling: Secondary | ICD-10-CM | POA: Diagnosis present

## 2015-03-06 ENCOUNTER — Ambulatory Visit (HOSPITAL_COMMUNITY): Payer: 59 | Admitting: Speech Pathology

## 2015-03-06 ENCOUNTER — Other Ambulatory Visit: Payer: Self-pay | Admitting: Neurology

## 2015-03-06 NOTE — Therapy (Signed)
[facility] [address] [place], [place], [address] Phone: [phone]   Fax:  [phone]  Physical Therapy Evaluation  Patient Details  Name: Travis [name] MRN: [id number] Date of Birth: 15-May-1960 Referring Provider: [name]  Encounter Date: 03/05/2015      PT End of Session - 03/05/15 1856    Visit Number 1   Number of Visits 18   Date for PT Re-Evaluation 04/04/15   Authorization Type UMR   Authorization Time Period 03/05/15-05/05/15   PT Start Time 1740   PT Stop Time 1815   PT Time Calculation (min) 35 min   Equipment Utilized During Treatment Gait belt   Activity Tolerance Patient tolerated treatment well   Behavior During Therapy [facility] for tasks assessed/performed      Past Medical History  Diagnosis Date  . Night sweats     every once in a while  . Hyperlipidemia   . Diabetes mellitus   . Hernia, inguinal   . Dizziness   . Chronic headaches   . Arthritis   . Neuropathy of lower extremity   . Diabetic peripheral neuropathy ([place]) 03/28/2013  . Diverticulosis   . Hypertension   . Complication of anesthesia     bleed after last shoulder-had to stay overnight  . Impingement syndrome of left shoulder 08/02/2014  . 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 Multi-infarct state ([place]) 10/14/2014  . Depression   . Anxiety     Past Surgical History  Procedure Laterality Date  . Inguinal hernia repair Bilateral   . Cervical disc surgery    . Wrist surgery Right     fusion  . Shoulder arthroscopy Right     1  . Knee arthroscopy Right   . Colon surgery  2003    1/3 removed for diverticulitis  . Colonoscopy    . Umbilical hernia repair      with other hernia repair with mesh  . Shoulder arthroscopy Left 08/02/2014    Procedure: LEFT SHOULDER SCOPE DEBRIDEMENT/ACROMIOPLASTY;  Surgeon: [name], MD;  Location: [facility];  Service: Orthopedics;  Laterality: Left;  ANESTHESIA: GENERAL,  PRE/POST OP SCALENE  . Tee without cardioversion N/A 03/03/2015    Procedure: TRANSESOPHAGEAL ECHOCARDIOGRAM (TEE);  Surgeon: [name], MD;  [facility];  Service: Cardiology;  Laterality: N/A;    There were no vitals filed for this visit.  Visit Diagnosis:  Decreased coordination  Weakness of right leg  Abnormality of gait  Risk for falls  Difficulty walking      Subjective Assessment - 03/05/15 1743    Subjective Pt reports that he had a stroke on 10/28. He reports that he woke up in the morning and felt like he was drunk because he was so woozy and unbalanced. He reports he has been walking with RW since the stroke. He reports that he feels like his walking is coming back to him slowly, but his balance and his speech are really the worst things right now. Pt states that his R side is the most affected side.    How long can you stand comfortably? 5-10 minutes   How long can you walk comfortably? 5-10 minutes   Patient Stated Goals Get back to PLOF, walk with no AD   Currently in Pain? Yes   Pain Score 5    Pain Location Hip   Pain Orientation Left  [facility] PT Assessment - 03/06/15 0001    Precautions   Precautions Fall   Prior Function   Level of Independence Independent   Vocation Full time employment   Observation/Other Assessments   Focus on Therapeutic Outcomes (FOTO)  60% limited   Coordination   Gross Motor Movements are Fluid and Coordinated No   Heel Shin Test impaired on RLE   Strength   Right Hip Flexion 3+/5   Right Hip Extension 3+/5   Right Hip ABduction 3/5   Left Hip Flexion 4+/5   Left Hip Extension 4/5   Left Hip ABduction 4+/5   Right Knee Flexion 4/5   Right Knee Extension 4-/5   Left Knee Flexion 4+/5   Left Knee Extension 5/5   Right Ankle Dorsiflexion 4-/5   Right Ankle Plantar Flexion 5/5   Transfers   Five time sit to stand comments  15.38   Ambulation/Gait   Ambulation/Gait Yes   Gait Pattern --   Steppage gait with RLE   Gait Comments TUG: 17.88   6 minute walk test results    Aerobic Endurance Distance Walked 420   Endurance additional comments 3 minute walk test                   PT Short Term Goals - 03/06/15 0741    PT SHORT TERM GOAL #1   Title Pt will be independent with HEP.   Time 3   Period Weeks   Status New   PT SHORT TERM GOAL #2   Title Increase LE strength to improve five time sit to stand to 12 seconds or less.    Time 3   Period Weeks   Status New   PT SHORT TERM GOAL #3   Title Decrease fall risk evidenced by TUG time of 15 seconds or less.   Time 3   Period Weeks   Status New   PT SHORT TERM GOAL #4   Title Improve gait speed evidenced by 475 ft or more ambulated during 3 minute walk test.    Time 3   Period Weeks   Status New           PT Long Term Goals - 03/06/15 0744    PT LONG TERM GOAL #1   Title Pt to be independent with advanced HEP.   Time 6   Period Weeks   Status New   PT LONG TERM GOAL #2   Title Improve RLE strength to 4/5 or greater to improve gait mechanics and functional mobility.   Time 6   Period Weeks   Status New   PT LONG TERM GOAL #3   Title Improve BLE strength and power evidenced by five time sit to stand time of 10 seconds or less.   Time 6   Period Weeks   Status New   PT LONG TERM GOAL #4   Title Decrease fall risk evidenced by 13 seconds or less on TUG.    Time 6   Period Weeks   Status New   PT LONG TERM GOAL #5   Title Pt will ambulate 1,000 feet or greater during 6MWT using LRAD or no AD to demonstrate improved gait speed and to return pt to community ambulation.    Time 6   Period Weeks   Status New               Plan - 03/05/15 1857    Clinical Impression Statement Pt presents to PT  following recent stroke. Upon examination, pt demonstrates decreased strength of RLE, impaired coordination of RLE, risk for falls, decreased gait speed, impaired balance, impaired functional  mobility, abnormal gait, and decreased safety awareness. Pt will benefit from skilled PT services at this time in order to improve strength  and balance to decrease fall risk, improve gait speed, and improve gait mechanics in order to return pt to optimal level of function.   Pt will benefit from skilled therapeutic intervention in order to improve on the following deficits Abnormal gait;Decreased activity tolerance;Decreased balance;Decreased coordination;Decreased endurance;Decreased mobility;Decreased safety awareness;Decreased strength;Difficulty walking   Rehab Potential Fair   PT Frequency 2x / week   PT Duration 6 weeks   PT Treatment/Interventions ADLs/Self Care Home Management;Gait training;Stair training;Functional mobility training;Therapeutic activities;Balance training;Neuromuscular re-education;Patient/family education;Therapeutic exercise;Manual techniques   PT Next Visit Plan Review goals, begin gait training with quad cane if tolerated, give HEP         Problem List Patient Active Problem List   Diagnosis Date Noted  . Cerebellar stroke ([place]) 02/28/2015  . Snoring 01/23/2015  . Degeneration of cervical intervertebral disc 01/23/2015  . Essential hypertension 12/14/2014  . Type 2 diabetes mellitus with other circulatory complications ([place]) 75/44/9201  . HLD (hyperlipidemia) 12/14/2014  . Neck pain 12/14/2014  . Cerebral infarction, chronic   . Dizzy   . Vertigo 10/14/2014  . Multi-infarct state ([place]) 10/14/2014  . CVA (cerebral infarction) 10/14/2014  . Impingement syndrome of left shoulder 08/02/2014  . 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 Abdominal pain 06/13/2014  . Rectal bleeding 03/13/2014  . Diabetes type 2, controlled ([place]) 02/18/2014  . Hyperlipidemia 02/18/2014  . Diabetic peripheral neuropathy ([place]) 03/28/2013  . Irreducible incisional hernia 11/11/2010    Hilma Favors, PT, DPT [phone] 03/06/2015, 7:53 AM  [facility] [place], [place], [address] Phone: [phone]   Fax:  [phone]  Name: DEREN DEGRAZIA MRN: [id number] Date of Birth: [birth year]

## 2015-03-07 ENCOUNTER — Encounter: Payer: Self-pay | Admitting: Family Medicine

## 2015-03-07 ENCOUNTER — Ambulatory Visit (INDEPENDENT_AMBULATORY_CARE_PROVIDER_SITE_OTHER): Payer: 59 | Admitting: Family Medicine

## 2015-03-07 VITALS — BP 130/84 | Ht 73.0 in | Wt 178.0 lb

## 2015-03-07 DIAGNOSIS — E119 Type 2 diabetes mellitus without complications: Secondary | ICD-10-CM | POA: Diagnosis not present

## 2015-03-07 DIAGNOSIS — I6349 Cerebral infarction due to embolism of other cerebral artery: Secondary | ICD-10-CM

## 2015-03-07 DIAGNOSIS — E785 Hyperlipidemia, unspecified: Secondary | ICD-10-CM

## 2015-03-07 MED ORDER — ATORVASTATIN CALCIUM 20 MG PO TABS: 20.0000 mg | ORAL_TABLET | Freq: Every day | ORAL | Status: DC

## 2015-03-07 NOTE — Progress Notes (Signed)
   Subjective:    Patient ID: Travis [name], male    DOB: 03-Apr-1961, 54 y.o.   MRN: [id number]  HPI Patient arrives for a follow up on recent hospitalization for CVA. Patient has had a new stroke last Friday.  30 minutes spent with patient discussing his hospital records discussing what went on also discussing recent testing including transesophageal echo  We also discussed the MRI report from last September from June and from most recent visit. Patient was being followed by neurology up this way but now will be seen neurology in [facility]  Review of Systems  patient with weakness difficulty speaking difficulty walking some memory issues and difficulty thinking things through. Denies chest tightness pressure pain or shortness of breath    Objective:   Physical Exam   patient has difficult time speaking has weakness in his legs difficult time walking cannot walk without the use a walker patient unable to write or sign his name because of weakness in the right arm and discoordination problems lungs clear heart regular 25 minutes was spent with the patient. Greater than half the time was spent in discussion and answering questions and counseling regarding the issues that the patient came in for today.      Assessment & Plan:   embolic stroke with multiple evidence of previous small strokes. Will do hypercoagulability panel. Continue aspirin and Plavix. Keep appointment with neurology in early December follow-up here 2 months    physical therapy occupational therapy speech therapy indicated    patient is currently disabled unable to work. He is been advised not to drive area   hyperlipidemia we will try to keep LDL below 70 , change in cholesterol medications  Blood pressure good control   A1c under good control recently importance of sticking with medicine discussed   Patent foramen ovale neurology will be seeing him soon to help [place] Marveen Reeks if this just needs aggressive antiplatelet  measures or possible corrective surgery

## 2015-03-10 ENCOUNTER — Ambulatory Visit (HOSPITAL_COMMUNITY): Payer: 59 | Admitting: Physical Therapy

## 2015-03-10 DIAGNOSIS — Z9181 History of falling: Secondary | ICD-10-CM

## 2015-03-10 DIAGNOSIS — R262 Difficulty in walking, not elsewhere classified: Secondary | ICD-10-CM

## 2015-03-10 DIAGNOSIS — R269 Unspecified abnormalities of gait and mobility: Secondary | ICD-10-CM

## 2015-03-10 DIAGNOSIS — R29898 Other symptoms and signs involving the musculoskeletal system: Secondary | ICD-10-CM

## 2015-03-10 DIAGNOSIS — R279 Unspecified lack of coordination: Secondary | ICD-10-CM | POA: Diagnosis not present

## 2015-03-10 DIAGNOSIS — R278 Other lack of coordination: Secondary | ICD-10-CM

## 2015-03-10 NOTE — Therapy (Signed)
[facility] [address] [place], [place], [address] Phone: [phone]   Fax:  [phone]  Physical Therapy Treatment  Patient Details  Name: Travis [name] MRN: [id number] Date of Birth: 02-03-1961 Referring Provider: [name]  Encounter Date: 03/10/2015      PT End of Session - 03/10/15 1724    Visit Number 2   Number of Visits 18   Date for PT Re-Evaluation 04/04/15   Authorization Type UMR   Authorization Time Period 03/05/15-05/05/15   PT Start Time 1346   PT Stop Time 1430   PT Time Calculation (min) 44 min   Equipment Utilized During Treatment Gait belt   Activity Tolerance Patient tolerated treatment well   Behavior During Therapy [facility] for tasks assessed/performed      Past Medical History  Diagnosis Date  . Night sweats     every once in a while  . Hyperlipidemia   . Diabetes mellitus   . Hernia, inguinal   . Dizziness   . Chronic headaches   . Arthritis   . Neuropathy of lower extremity   . Diabetic peripheral neuropathy ([place]) 03/28/2013  . Diverticulosis   . Hypertension   . Complication of anesthesia     bleed after last shoulder-had to stay overnight  . Impingement syndrome of left shoulder 08/02/2014  . 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 Multi-infarct state ([place]) 10/14/2014  . Depression   . Anxiety     Past Surgical History  Procedure Laterality Date  . Inguinal hernia repair Bilateral   . Cervical disc surgery    . Wrist surgery Right     fusion  . Shoulder arthroscopy Right     1  . Knee arthroscopy Right   . Colon surgery  2003    1/3 removed for diverticulitis  . Colonoscopy    . Umbilical hernia repair      with other hernia repair with mesh  . Shoulder arthroscopy Left 08/02/2014    Procedure: LEFT SHOULDER SCOPE DEBRIDEMENT/ACROMIOPLASTY;  Surgeon: [name], MD;  Location: [employer];  Service: Orthopedics;  Laterality: Left;  ANESTHESIA: GENERAL,  PRE/POST OP SCALENE  . Tee without cardioversion N/A 03/03/2015    Procedure: TRANSESOPHAGEAL ECHOCARDIOGRAM (TEE);  Surgeon: [name], MD;  [facility];  Service: Cardiology;  Laterality: N/A;    There were no vitals filed for this visit.  Visit Diagnosis:  Decreased coordination  Weakness of right leg  Abnormality of gait  Risk for falls  Difficulty walking      Subjective Assessment - 03/10/15 1350    Subjective Pt reports that he is still having some pain in his hip today. His biggest complaint is that he is tired all the time.    Currently in Pain? No/denies   Pain Score 0-No pain                         [facility] Adult PT Treatment/Exercise - 03/10/15 0001    Ambulation/Gait   Ambulation/Gait Yes   Ambulation Distance (Feet) 450 Feet   Assistive device Small based quad cane   Exercises   Exercises Knee/Hip   Knee/Hip Exercises: Standing   Rocker Board 2 minutes   Knee/Hip Exercises: Supine   Bridges 15 reps;2 sets   Straight Leg Raises 10 reps   Knee/Hip Exercises: Sidelying   Hip ABduction 10 reps   Knee/Hip Exercises: Prone   Hamstring Curl  15 reps   Hamstring Curl Limitations 3#   Hip Extension 10 reps             Balance Exercises - 03/10/15 1724    Balance Exercises: Standing   Step Ups 6 inch  tap ups at 6" step             PT Short Term Goals - 03/06/15 0741    PT SHORT TERM GOAL #1   Title Pt will be independent with HEP.   Time 3   Period Weeks   Status New   PT SHORT TERM GOAL #2   Title Increase LE strength to improve five time sit to stand to 12 seconds or less.    Time 3   Period Weeks   Status New   PT SHORT TERM GOAL #3   Title Decrease fall risk evidenced by TUG time of 15 seconds or less.   Time 3   Period Weeks   Status New   PT SHORT TERM GOAL #4   Title Improve gait speed evidenced by 475 ft or more ambulated during 3 minute walk test.    Time 3   Period Weeks   Status New            PT Long Term Goals - 03/06/15 0744    PT LONG TERM GOAL #1   Title Pt to be independent with advanced HEP.   Time 6   Period Weeks   Status New   PT LONG TERM GOAL #2   Title Improve RLE strength to 4/5 or greater to improve gait mechanics and functional mobility.   Time 6   Period Weeks   Status New   PT LONG TERM GOAL #3   Title Improve BLE strength and power evidenced by five time sit to stand time of 10 seconds or less.   Time 6   Period Weeks   Status New   PT LONG TERM GOAL #4   Title Decrease fall risk evidenced by 13 seconds or less on TUG.    Time 6   Period Weeks   Status New   PT LONG TERM GOAL #5   Title Pt will ambulate 1,000 feet or greater during 6MWT using LRAD or no AD to demonstrate improved gait speed and to return pt to community ambulation.    Time 6   Period Weeks   Status New               Plan - 03/10/15 1725    Clinical Impression Statement Functional strengthening began in today's treatment to increase RLE strength in order to improve gait mechanics. Pt was able to complete all therex with minimal verbal cueing and no c/o pain. Gait training with [facility] was initiated today, pt demonstrated good form after education on proper sequencing, occasionally demonstrated steppage gait with RLE.    PT Next Visit Plan Continue with gait training, progress to [facility] if able        Problem List Patient Active Problem List   Diagnosis Date Noted  . Cerebellar stroke ([place]) 02/28/2015  . Snoring 01/23/2015  . Degeneration of cervical intervertebral disc 01/23/2015  . Essential hypertension 12/14/2014  . Type 2 diabetes mellitus with other circulatory complications ([place]) 16/02/9603  . HLD (hyperlipidemia) 12/14/2014  . Neck pain 12/14/2014  . Cerebral infarction, chronic   . Dizzy   . Vertigo 10/14/2014  . Multi-infarct state ([place]) 10/14/2014  . CVA (cerebral infarction) 10/14/2014  . Impingement syndrome  of left shoulder 08/02/2014  .  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 Abdominal pain 06/13/2014  . Rectal bleeding 03/13/2014  . Diabetes type 2, controlled ([place]) 02/18/2014  . Hyperlipidemia 02/18/2014  . Diabetic peripheral neuropathy ([place]) 03/28/2013  . Irreducible incisional hernia 11/11/2010    Hilma Favors, PT, DPT [phone] 03/10/2015, 5:27 PM  [facility] [address] [place], [place], [address] Phone: [phone]   Fax:  [phone]  Name: Travis [name] MRN: [id number] Date of Birth: Aug 12, 1960

## 2015-03-11 ENCOUNTER — Telehealth: Payer: Self-pay | Admitting: *Deleted

## 2015-03-11 NOTE — Telephone Encounter (Signed)
Fax received of Intent to cancel. [employer] has been unable to contact the pt. The fax states that the study will be cancelled and [facility] [employer] [facility] will be notified to  retrieve the device if they do not hear for the pt in the next 48 hours. Pt called. No answer. Unable to leave voice message due to mail box being full.

## 2015-03-12 ENCOUNTER — Ambulatory Visit (INDEPENDENT_AMBULATORY_CARE_PROVIDER_SITE_OTHER): Payer: 59

## 2015-03-12 DIAGNOSIS — I639 Cerebral infarction, unspecified: Secondary | ICD-10-CM | POA: Diagnosis not present

## 2015-03-12 NOTE — Telephone Encounter (Signed)
Called concerning apt date and time  Casey Cockerham, LPTA; CBIS [phone]  

## 2015-03-13 ENCOUNTER — Ambulatory Visit (HOSPITAL_COMMUNITY): Payer: 59 | Admitting: Speech Pathology

## 2015-03-13 ENCOUNTER — Ambulatory Visit (HOSPITAL_COMMUNITY): Payer: 59 | Admitting: Physical Therapy

## 2015-03-13 DIAGNOSIS — R2681 Unsteadiness on feet: Secondary | ICD-10-CM

## 2015-03-13 DIAGNOSIS — R279 Unspecified lack of coordination: Secondary | ICD-10-CM | POA: Diagnosis not present

## 2015-03-13 DIAGNOSIS — R278 Other lack of coordination: Secondary | ICD-10-CM

## 2015-03-13 DIAGNOSIS — R29898 Other symptoms and signs involving the musculoskeletal system: Secondary | ICD-10-CM

## 2015-03-13 DIAGNOSIS — R269 Unspecified abnormalities of gait and mobility: Secondary | ICD-10-CM

## 2015-03-13 DIAGNOSIS — I69919 Unspecified symptoms and signs involving cognitive functions following unspecified cerebrovascular disease: Secondary | ICD-10-CM

## 2015-03-13 DIAGNOSIS — R262 Difficulty in walking, not elsewhere classified: Secondary | ICD-10-CM

## 2015-03-13 DIAGNOSIS — R471 Dysarthria and anarthria: Secondary | ICD-10-CM

## 2015-03-13 DIAGNOSIS — Z9181 History of falling: Secondary | ICD-10-CM

## 2015-03-13 NOTE — Therapy (Signed)
[facility], [place], [address] Phone: [phone]   Fax:  [phone]  Speech Language Pathology Treatment  Patient Details  Name: Travis [name] MRN: [id number] Date of Birth: 1960-10-25 Referring Provider: Dr. [name]  Encounter Date: 03/13/2015      End of Session - 03/13/15 1640    Visit Number 2   Number of Visits 8   Date for SLP Re-Evaluation 04/17/15   Authorization Type UMR   SLP Start Time 1700   SLP Stop Time  1745   SLP Time Calculation (min) 45 min   Activity Tolerance Patient tolerated treatment well      Past Medical History  Diagnosis Date  . Night sweats     every once in a while  . Hyperlipidemia   . Diabetes mellitus   . Hernia, inguinal   . Dizziness   . Chronic headaches   . Arthritis   . Neuropathy of lower extremity   . Diabetic peripheral neuropathy ([place]) 03/28/2013  . Diverticulosis   . Hypertension   . Complication of anesthesia     bleed after last shoulder-had to stay overnight  . Impingement syndrome of left shoulder 08/02/2014  . 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 Multi-infarct state ([place]) 10/14/2014  . Depression   . Anxiety     Past Surgical History  Procedure Laterality Date  . Inguinal hernia repair Bilateral   . Cervical disc surgery    . Wrist surgery Right     fusion  . Shoulder arthroscopy Right     1  . Knee arthroscopy Right   . Colon surgery  2003    1/3 removed for diverticulitis  . Colonoscopy    . Umbilical hernia repair      with other hernia repair with mesh  . Shoulder arthroscopy Left 08/02/2014    Procedure: LEFT SHOULDER SCOPE DEBRIDEMENT/ACROMIOPLASTY;  Surgeon: [name], MD;  Location: [facility];  Service: Orthopedics;  Laterality: Left;  ANESTHESIA: GENERAL, PRE/POST OP SCALENE  . Tee without cardioversion N/A 03/03/2015    Procedure: TRANSESOPHAGEAL ECHOCARDIOGRAM (TEE);  Surgeon: [name], MD;  [facility];  Service: Cardiology;  Laterality: N/A;    There were no vitals filed for this visit.  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 No/denies          ADULT SLP TREATMENT - 03/13/15 1631    General Information   Behavior/Cognition Alert;Cooperative;Pleasant mood   Patient Positioning Upright in chair   Oral care provided N/A   HPI 54 yo male with past medical history of CVA who presents for cognitive linguistic therapy.   Treatment Provided   Treatment provided Cognitive-Linquistic   Pain Assessment   Pain Assessment 0-10   Pain Score 5    Pain Location Left hip   Pain Descriptors / Indicators Aching   Cognitive-Linquistic Treatment   Treatment focused on Dysarthria;Cognition;Patient/family/caregiver education   Skilled Treatment dysarthria/speech intelligibility strategies; use of memory strategies   Assessment / Recommendations / Plan   Plan Continue with current plan of care   Progression Toward Goals   Progression toward goals Progressing toward goals          SLP Education - 03/13/15 1635    Education provided Yes  Education Details Discussed changes in speech from most recent hospitalization;stroke   Person(s) Educated Patient   Methods Explanation;Handout;Demonstration   Comprehension Verbalized understanding          SLP Short Term Goals - 03/13/15 1707    SLP SHORT TERM GOAL #1   Title Pt will implement memory strategies in functional tasks for 90% acc with min assist   Time 6   Period Weeks   Status On-going   SLP SHORT TERM GOAL #2   Title Pt will complete mod level thought organization and deductive reasoning tasks with 90% acc with use of strategies and min assist.    Time 6   Period Weeks   Status On-going   SLP SHORT TERM GOAL #3   Title  Pt will identify appropriate strategies to use in hypothetical everyday problem solving scenarios with min assist.    Time 6   Period Weeks   Status On-going   SLP SHORT TERM GOAL #4   Title Pt will implement speech intelligibility strategies with 95% acc during conversations to increase naturalness and intelligibility of speech.   Baseline 75% acc   Time 8   Period Weeks   Status New          SLP Long Term Goals - 03/13/15 1708    SLP LONG TERM GOAL #1   Title Pt will communicate complex thoughts and feelings to [facility] with use of strategies. Pt will also be independent with use of memory and thought organization strategies.   Baseline min impairment   Time 3   Period Months   Status New          Plan - 03/13/15 1659    Clinical Impression Statement Mr. Nevius has not been seen since his initial evaluation due to recent hospitalization for a new stroke. Pt now presenting with dysarthria which impacts naturalness of speech and multisyllabic words. Pt also reports difficulty with word finding, specifically coming up with the names of favorite television shows. Previous evaluation discussed with patient and goals reviewed. Pt given memory strategies to review and list of multisyllabic words to practice reading aloud using exaggerated lip and tongue movements. Continue with POC.    Speech Therapy Frequency 2x / week   Duration 4 weeks   Treatment/Interventions Functional tasks;SLP instruction and feedback;Compensatory strategies;Patient/family education;Internal/external aids;Cognitive reorganization;Cueing hierarchy   Potential to Achieve Goals Good   Potential Considerations Co-morbidities   SLP Home Exercise Plan Pt will be independent in HEP as assigned to facilitate carryover of treatment strategies and techniques in home and community environment   Consulted and Agree with Plan of Care Patient        Problem List Patient Active Problem List   Diagnosis Date Noted  .  Cerebellar stroke ([place]) 02/28/2015  . Snoring 01/23/2015  . Degeneration of cervical intervertebral disc 01/23/2015  . Essential hypertension 12/14/2014  . Type 2 diabetes mellitus with other circulatory complications ([place]) [id number]  . HLD (hyperlipidemia) 12/14/2014  . Neck pain 12/14/2014  . Cerebral infarction, chronic   . Dizzy   . Vertigo 10/14/2014  . Multi-infarct state ([place]) 10/14/2014  . CVA (cerebral infarction) 10/14/2014  . Impingement syndrome of left shoulder 08/02/2014  . 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 Abdominal pain 06/13/2014  . Rectal bleeding 03/13/2014  . Diabetes type 2, controlled ([place]) 02/18/2014  . Hyperlipidemia 02/18/2014  . Diabetic peripheral neuropathy ([place]) 03/28/2013  . Irreducible incisional hernia 11/11/2010   Thank you,  Genene Churn, CCC-SLP  [phone]  Krystianna Soth 03/13/2015, 5:11 PM  [facility] [address] [place], [place], [address] Phone: [phone]   Fax:  [phone]   Name: Travis [name] MRN: [id number] Date of Birth: Dec 23, 1960

## 2015-03-13 NOTE — Therapy (Signed)
[facility] [address] [place], [place], [address] Phone: [phone]   Fax:  [phone]  Physical Therapy Treatment  Patient Details  Name: Travis [name] MRN: [id number] Date of Birth: 06-Feb-1961 Referring Provider: [name]  Encounter Date: 03/13/2015      PT End of Session - 03/13/15 1800    Visit Number 3   Number of Visits 18   Date for PT Re-Evaluation 04/04/15   Authorization Type UMR   Authorization Time Period 03/05/15-05/05/15   PT Start Time 1653   PT Stop Time 1731   PT Time Calculation (min) 38 min   Equipment Utilized During Treatment Gait belt   Activity Tolerance Patient tolerated treatment well   Behavior During Therapy [facility] for tasks assessed/performed      Past Medical History  Diagnosis Date  . Night sweats     every once in a while  . Hyperlipidemia   . Diabetes mellitus   . Hernia, inguinal   . Dizziness   . Chronic headaches   . Arthritis   . Neuropathy of lower extremity   . Diabetic peripheral neuropathy ([place]) 03/28/2013  . Diverticulosis   . Hypertension   . Complication of anesthesia     bleed after last shoulder-had to stay overnight  . Impingement syndrome of left shoulder 08/02/2014  . 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 Multi-infarct state ([place]) 10/14/2014  . Depression   . Anxiety     Past Surgical History  Procedure Laterality Date  . Inguinal hernia repair Bilateral   . Cervical disc surgery    . Wrist surgery Right     fusion  . Shoulder arthroscopy Right     1  . Knee arthroscopy Right   . Colon surgery  2003    1/3 removed for diverticulitis  . Colonoscopy    . Umbilical hernia repair      with other hernia repair with mesh  . Shoulder arthroscopy Left 08/02/2014    Procedure: LEFT SHOULDER SCOPE DEBRIDEMENT/ACROMIOPLASTY;  Surgeon: [name], MD;  Location: [facility];  Service: Orthopedics;  Laterality: Left;  ANESTHESIA: GENERAL,  PRE/POST OP SCALENE  . Tee without cardioversion N/A 03/03/2015    Procedure: TRANSESOPHAGEAL ECHOCARDIOGRAM (TEE);  Surgeon: [name], MD;  [facility];  Service: Cardiology;  Laterality: N/A;    There were no vitals filed for this visit.  Visit Diagnosis:  Decreased coordination  Weakness of right leg  Abnormality of gait  Risk for falls  Difficulty walking  Unsteadiness      Subjective Assessment - 03/13/15 1740    Subjective Patient reports that he is having a bit of a hard day, just feeling so-so and still a bit dizzy    Pertinent History Vertigo started about 2 years ago; just came on out of nowhere. Right now dizziness is 10/10. Seems like every time it sets off, something in neck seems to pop. Addressed shoulder first, now coming for vertigo. Had neck surgery about 5-6 years ago .   Currently in Pain? No/denies                         [facility] Adult PT Treatment/Exercise - 03/13/15 0001    Knee/Hip Exercises: Standing   Gait Training gait with small base quad cane with focus on proper sequencing and safe use of device, elimination of steppage gait    Knee/Hip Exercises: Supine  Bridges 15 reps   Straight Leg Raises 10 reps   Knee/Hip Exercises: Sidelying   Hip ABduction 10 reps   Knee/Hip Exercises: Prone   Hip Extension 10 reps             Balance Exercises - 03/13/15 1742    Balance Exercises: Standing   Tandem Stance Eyes open;3 reps;15 secs   SLS Eyes open;3 reps;10 secs   Rockerboard Anterior/posterior;Lateral;Other (comment)  no HHA, Min(A)           PT Education - 03/13/15 1743    Education provided Yes   Education Details impact of stroke on physical strength and fatigue    Person(s) Educated Patient   Methods Explanation   Comprehension Verbalized understanding          PT Short Term Goals - 03/06/15 0741    PT SHORT TERM GOAL #1   Title Pt will be independent with HEP.   Time 3   Period  Weeks   Status New   PT SHORT TERM GOAL #2   Title Increase LE strength to improve five time sit to stand to 12 seconds or less.    Time 3   Period Weeks   Status New   PT SHORT TERM GOAL #3   Title Decrease fall risk evidenced by TUG time of 15 seconds or less.   Time 3   Period Weeks   Status New   PT SHORT TERM GOAL #4   Title Improve gait speed evidenced by 475 ft or more ambulated during 3 minute walk test.    Time 3   Period Weeks   Status New           PT Long Term Goals - 03/06/15 0744    PT LONG TERM GOAL #1   Title Pt to be independent with advanced HEP.   Time 6   Period Weeks   Status New   PT LONG TERM GOAL #2   Title Improve RLE strength to 4/5 or greater to improve gait mechanics and functional mobility.   Time 6   Period Weeks   Status New   PT LONG TERM GOAL #3   Title Improve BLE strength and power evidenced by five time sit to stand time of 10 seconds or less.   Time 6   Period Weeks   Status New   PT LONG TERM GOAL #4   Title Decrease fall risk evidenced by 13 seconds or less on TUG.    Time 6   Period Weeks   Status New   PT LONG TERM GOAL #5   Title Pt will ambulate 1,000 feet or greater during 6MWT using LRAD or no AD to demonstrate improved gait speed and to return pt to community ambulation.    Time 6   Period Weeks   Status New               Plan - 03/13/15 1801    Clinical Impression Statement Focused on functional strengthening as well as functional gait pattern and balance in order to improve overall function; placed 1 pound weights around patient's R UE and LE to facilitate improved muscle coordination and improved proprioception. Patient demonstrated improved gait with weights around his legs however did repqurie occasional cues to eliminate steppage gait. Significant difficulty during tandem stance and SLS today, requiring Min-Mod(A) to maintain balance.    Pt will benefit from skilled therapeutic intervention in order to  improve on the following deficits Abnormal  gait;Decreased activity tolerance;Decreased balance;Decreased coordination;Decreased endurance;Decreased mobility;Decreased safety awareness;Decreased strength;Difficulty walking   Rehab Potential Fair   PT Frequency 2x / week   PT Duration 6 weeks   PT Treatment/Interventions ADLs/Self Care Home Management;Gait training;Stair training;Functional mobility training;Therapeutic activities;Balance training;Neuromuscular re-education;Patient/family education;Therapeutic exercise;Manual techniques   PT Next Visit Plan Continue with gait training, progress to [facility] if able. Also continue functional strength and balance. Continue with weights on limbs to assist proprioception.    PT Home Exercise Plan given   Consulted and Agree with Plan of Care Patient        Problem List Patient Active Problem List   Diagnosis Date Noted  . Cerebellar stroke ([place]) 02/28/2015  . Snoring 01/23/2015  . Degeneration of cervical intervertebral disc 01/23/2015  . Essential hypertension 12/14/2014  . Type 2 diabetes mellitus with other circulatory complications ([place]) [id number]  . HLD (hyperlipidemia) 12/14/2014  . Neck pain 12/14/2014  . Cerebral infarction, chronic   . Dizzy   . Vertigo 10/14/2014  . Multi-infarct state ([place]) 10/14/2014  . CVA (cerebral infarction) 10/14/2014  . Impingement syndrome of left shoulder 08/02/2014  . 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 Abdominal pain 06/13/2014  . Rectal bleeding 03/13/2014  . Diabetes type 2, controlled ([place]) 02/18/2014  . Hyperlipidemia 02/18/2014  . Diabetic peripheral neuropathy ([place]) 03/28/2013  . Irreducible incisional hernia 11/11/2010    Deniece Ree PT, DPT [facility] [address] [place], [place], [address] Phone: [phone]   Fax:  [phone]  Name: THEOREN SCHRAMM MRN: [id number] Date of Birth: 1960/05/21

## 2015-03-14 ENCOUNTER — Ambulatory Visit (INDEPENDENT_AMBULATORY_CARE_PROVIDER_SITE_OTHER): Payer: 59 | Admitting: Neurology

## 2015-03-14 DIAGNOSIS — G479 Sleep disorder, unspecified: Secondary | ICD-10-CM

## 2015-03-14 DIAGNOSIS — G4733 Obstructive sleep apnea (adult) (pediatric): Secondary | ICD-10-CM | POA: Diagnosis not present

## 2015-03-14 DIAGNOSIS — G4720 Circadian rhythm sleep disorder, unspecified type: Secondary | ICD-10-CM

## 2015-03-14 NOTE — Sleep Study (Signed)
Please see the scanned sleep study interpretation located in the procedure tab within the chart review section.   

## 2015-03-18 ENCOUNTER — Ambulatory Visit (HOSPITAL_COMMUNITY): Payer: 59 | Admitting: Speech Pathology

## 2015-03-18 ENCOUNTER — Ambulatory Visit (HOSPITAL_COMMUNITY): Payer: 59 | Admitting: Physical Therapy

## 2015-03-18 DIAGNOSIS — R2681 Unsteadiness on feet: Secondary | ICD-10-CM

## 2015-03-18 DIAGNOSIS — R29898 Other symptoms and signs involving the musculoskeletal system: Secondary | ICD-10-CM

## 2015-03-18 DIAGNOSIS — Z9181 History of falling: Secondary | ICD-10-CM

## 2015-03-18 DIAGNOSIS — R269 Unspecified abnormalities of gait and mobility: Secondary | ICD-10-CM

## 2015-03-18 DIAGNOSIS — R471 Dysarthria and anarthria: Secondary | ICD-10-CM

## 2015-03-18 DIAGNOSIS — R278 Other lack of coordination: Secondary | ICD-10-CM

## 2015-03-18 DIAGNOSIS — R262 Difficulty in walking, not elsewhere classified: Secondary | ICD-10-CM

## 2015-03-18 DIAGNOSIS — R279 Unspecified lack of coordination: Secondary | ICD-10-CM

## 2015-03-18 DIAGNOSIS — I69919 Unspecified symptoms and signs involving cognitive functions following unspecified cerebrovascular disease: Secondary | ICD-10-CM

## 2015-03-18 NOTE — Therapy (Signed)
[facility] [place], [place], [address] Phone: [phone]   Fax:  [phone]  Physical Therapy Treatment  Patient Details  Name: Travis [name] MRN: [id number] Date of Birth: 01-20-1961 Referring Provider: [name]  Encounter Date: 03/18/2015      PT End of Session - 03/18/15 1745    Visit Number 4   Number of Visits 18   Date for PT Re-Evaluation 04/04/15   Authorization Type UMR   Authorization Time Period 03/05/15-05/05/15   PT Start Time 1653   PT Stop Time 1731   PT Time Calculation (min) 38 min   Equipment Utilized During Treatment Gait belt   Activity Tolerance Patient tolerated treatment well   Behavior During Therapy [facility] for tasks assessed/performed      Past Medical History  Diagnosis Date  . Night sweats     every once in a while  . Hyperlipidemia   . Diabetes mellitus   . Hernia, inguinal   . Dizziness   . Chronic headaches   . Arthritis   . Neuropathy of lower extremity   . Diabetic peripheral neuropathy ([place]) 03/28/2013  . Diverticulosis   . Hypertension   . Complication of anesthesia     bleed after last shoulder-had to stay overnight  . Impingement syndrome of left shoulder 08/02/2014  . 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 Multi-infarct state ([place]) 10/14/2014  . Depression   . Anxiety     Past Surgical History  Procedure Laterality Date  . Inguinal hernia repair Bilateral   . Cervical disc surgery    . Wrist surgery Right     fusion  . Shoulder arthroscopy Right     1  . Knee arthroscopy Right   . Colon surgery  2003    1/3 removed for diverticulitis  . Colonoscopy    . Umbilical hernia repair      with other hernia repair with mesh  . Shoulder arthroscopy Left 08/02/2014    Procedure: LEFT SHOULDER SCOPE DEBRIDEMENT/ACROMIOPLASTY;  Surgeon: [name], MD;  Location: [facility];  Service: Orthopedics;  Laterality: Left;  ANESTHESIA: GENERAL,  PRE/POST OP SCALENE  . Tee without cardioversion N/A 03/03/2015    Procedure: TRANSESOPHAGEAL ECHOCARDIOGRAM (TEE);  Surgeon: [name], MD;  [facility];  Service: Cardiology;  Laterality: N/A;    There were no vitals filed for this visit.  Visit Diagnosis:  Weakness of right leg  Decreased coordination  Abnormality of gait  Risk for falls  Difficulty walking  Unsteadiness      Subjective Assessment - 03/18/15 1743    Subjective Patient reports that he is doing OK today, no major complaints    Currently in Pain? No/denies                         [facility] Adult PT Treatment/Exercise - 03/18/15 0001    Knee/Hip Exercises: Standing   Gait Training gait with small base quad cane with focus on proper sequencing and safe use of device, elimination of steppage gait    Knee/Hip Exercises: Seated   Other Seated Knee/Hip Exercises alternating opposite UE/LE flexion with ab sets; cross-midline reaches while sitting on air pad and with core activitation; catch with ball thrown at long angles to promote core activiation on air pad    Knee/Hip Exercises: Supine   Bridges Both;2 sets;15 reps   Knee/Hip Exercises: Sidelying   Hip ABduction Both;2  sets;15 reps   Knee/Hip Exercises: Prone   Hip Extension Both;2 sets;10 reps             Balance Exercises - 03/18/15 1745    Balance Exercises: Standing   SLS Eyes open;Solid surface;3 reps;10 secs  Min-Mod(A) for balance    Rockerboard Anterior/posterior;Lateral;Other (comment)  no HHA, Min(A) for balance            PT Education - 03/18/15 1745    Education provided No          PT Short Term Goals - 03/06/15 0741    PT SHORT TERM GOAL #1   Title Pt will be independent with HEP.   Time 3   Period Weeks   Status New   PT SHORT TERM GOAL #2   Title Increase LE strength to improve five time sit to stand to 12 seconds or less.    Time 3   Period Weeks   Status New   PT SHORT TERM  GOAL #3   Title Decrease fall risk evidenced by TUG time of 15 seconds or less.   Time 3   Period Weeks   Status New   PT SHORT TERM GOAL #4   Title Improve gait speed evidenced by 475 ft or more ambulated during 3 minute walk test.    Time 3   Period Weeks   Status New           PT Long Term Goals - 03/06/15 0744    PT LONG TERM GOAL #1   Title Pt to be independent with advanced HEP.   Time 6   Period Weeks   Status New   PT LONG TERM GOAL #2   Title Improve RLE strength to 4/5 or greater to improve gait mechanics and functional mobility.   Time 6   Period Weeks   Status New   PT LONG TERM GOAL #3   Title Improve BLE strength and power evidenced by five time sit to stand time of 10 seconds or less.   Time 6   Period Weeks   Status New   PT LONG TERM GOAL #4   Title Decrease fall risk evidenced by 13 seconds or less on TUG.    Time 6   Period Weeks   Status New   PT LONG TERM GOAL #5   Title Pt will ambulate 1,000 feet or greater during 6MWT using LRAD or no AD to demonstrate improved gait speed and to return pt to community ambulation.    Time 6   Period Weeks   Status New               Plan - 03/18/15 1746    Clinical Impression Statement Continued to focus on functional strength and balance today, as well as functional gait. Also introduced more core activation exercises in order to improve recruitment of proximal stability and improve functional task performance in standing. Pateint appears to be improving with small base quad cane, however states that he still does not have high confidence with cane. Continues to dispplay difficulty with functional balance tasks especially SLS at this time. Blood pressure 142/87 at end of session per auto-cuff. All balance training, functional strengthening, and gait activities were performed seperately of each other during this session.    Pt will benefit from skilled therapeutic intervention in order to improve on the  following deficits Abnormal gait;Decreased activity tolerance;Decreased balance;Decreased coordination;Decreased endurance;Decreased mobility;Decreased safety awareness;Decreased strength;Difficulty walking   Rehab Potential  Fair   PT Frequency 2x / week   PT Duration 6 weeks   PT Treatment/Interventions ADLs/Self Care Home Management;Gait training;Stair training;Functional mobility training;Therapeutic activities;Balance training;Neuromuscular re-education;Patient/family education;Therapeutic exercise;Manual techniques   PT Next Visit Plan Continue with gait training, progress to [facility] if able. Also continue functional strength and balance. Continue with weights on limbs to assist proprioception. Continue core activation.    PT Home Exercise Plan given   Consulted and Agree with Plan of Care Patient        Problem List Patient Active Problem List   Diagnosis Date Noted  . Cerebellar stroke ([place]) 02/28/2015  . Snoring 01/23/2015  . Degeneration of cervical intervertebral disc 01/23/2015  . Essential hypertension 12/14/2014  . Type 2 diabetes mellitus with other circulatory complications ([place]) [id number]  . HLD (hyperlipidemia) 12/14/2014  . Neck pain 12/14/2014  . Cerebral infarction, chronic   . Dizzy   . Vertigo 10/14/2014  . Multi-infarct state ([place]) 10/14/2014  . CVA (cerebral infarction) 10/14/2014  . Impingement syndrome of left shoulder 08/02/2014  . 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 Abdominal pain 06/13/2014  . Rectal bleeding 03/13/2014  . Diabetes type 2, controlled ([place]) 02/18/2014  . Hyperlipidemia 02/18/2014  . Diabetic peripheral neuropathy ([place]) 03/28/2013  . Irreducible incisional hernia 11/11/2010    Deniece Ree PT, DPT [facility] [address] [place], [place], [address] Phone: [phone]   Fax:  [phone]  Name: RASMUS BERARDI MRN: [id number] Date of Birth:  [birth year]

## 2015-03-18 NOTE — Therapy (Signed)
[place], [place], [address] Phone: [phone]   Fax:  [phone]  Speech Language Pathology Treatment  Patient Details  Name: Travis [name] MRN: [id number] Date of Birth: 1961/03/06 Referring Provider: Dr. [name]  Encounter Date: 03/18/2015      End of Session - 03/18/15 1633    Visit Number 3   Number of Visits 8   Date for SLP Re-Evaluation 04/17/15   Authorization Type UMR   SLP Start Time 1605   Activity Tolerance Patient tolerated treatment well      Past Medical History  Diagnosis Date  . Night sweats     every once in a while  . Hyperlipidemia   . Diabetes mellitus   . Hernia, inguinal   . Dizziness   . Chronic headaches   . Arthritis   . Neuropathy of lower extremity   . Diabetic peripheral neuropathy ([place]) 03/28/2013  . Diverticulosis   . Hypertension   . Complication of anesthesia     bleed after last shoulder-had to stay overnight  . Impingement syndrome of left shoulder 08/02/2014  . 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 Multi-infarct state ([place]) 10/14/2014  . Depression   . Anxiety     Past Surgical History  Procedure Laterality Date  . Inguinal hernia repair Bilateral   . Cervical disc surgery    . Wrist surgery Right     fusion  . Shoulder arthroscopy Right     1  . Knee arthroscopy Right   . Colon surgery  2003    1/3 removed for diverticulitis  . Colonoscopy    . Umbilical hernia repair      with other hernia repair with mesh  . Shoulder arthroscopy Left 08/02/2014    Procedure: LEFT SHOULDER SCOPE DEBRIDEMENT/ACROMIOPLASTY;  Surgeon: [name], MD;  Location: [facility];  Service: Orthopedics;  Laterality: Left;  ANESTHESIA: GENERAL, PRE/POST OP SCALENE  . Tee without cardioversion N/A 03/03/2015    Procedure: TRANSESOPHAGEAL ECHOCARDIOGRAM (TEE);  Surgeon: [name], MD;  [facility];  Service: Cardiology;   Laterality: N/A;    There were no vitals filed for this visit.  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 No/denies               ADULT SLP TREATMENT - 03/18/15 1611    General Information   Behavior/Cognition Alert;Cooperative;Pleasant mood   Patient Positioning Upright in chair   Oral care provided N/A   HPI 54 yo male with past medical history of CVA who presents for cognitive linguistic therapy.   Treatment Provided   Treatment provided Cognitive-Linquistic   Cognitive-Linquistic Treatment   Treatment focused on Dysarthria;Cognition;Patient/family/caregiver education   Skilled Treatment dysarthria/speech intelligibility strategies; use of memory strategies   Assessment / Recommendations / Plan   Plan Continue with current plan of care   Progression Toward Goals   Progression toward goals Progressing toward goals            SLP Short Term Goals - 03/18/15 1701    SLP SHORT TERM GOAL #1   Title Pt will implement memory strategies in functional tasks for 90% acc with min assist   Time 6   Period Weeks  Status On-going   SLP SHORT TERM GOAL #2   Title Pt will complete mod level thought organization and deductive reasoning tasks with 90% acc with use of strategies and min assist.    Time 6   Period Weeks   Status On-going   SLP SHORT TERM GOAL #3   Title Pt will identify appropriate strategies to use in hypothetical everyday problem solving scenarios with min assist.    Time 6   Period Weeks   Status On-going   SLP SHORT TERM GOAL #4   Title Pt will implement speech intelligibility strategies with 95% acc during conversations to increase naturalness and intelligibility of speech.   Baseline 75% acc   Time 8   Period Weeks   Status New          SLP Long Term  Goals - 03/18/15 1701    SLP LONG TERM GOAL #1   Title Pt will communicate complex thoughts and feelings to [facility] with use of strategies. Pt will also be independent with use of memory and thought organization strategies.   Baseline min impairment   Time 3   Period Months   Status New          Plan - 03/18/15 1653    Clinical Impression Statement Travis [name] states that he did not complete his homework because he left it in his wife's car. Speech is much less dysarthric today and he acknowledges the same. Session focused on cued word recall/memory tasks. He completed each trial (of same list) as follows: 60%, 70%, 90%, 100% and was then able to recall list of 10 words with 9/10 correct. SLP provided insight as to impact of repetition and making associations to increase recall. Memory strategies were introduced today, but not completely reviewed. SLP encouraged pt to implement speech intelligibility strategies via over articulation and pausing (loudness is adequate). Continue with memory strategies next session.    Speech Therapy Frequency 2x / week   Duration 4 weeks   Treatment/Interventions Functional tasks;SLP instruction and feedback;Compensatory strategies;Patient/family education;Internal/external aids;Cognitive reorganization;Cueing hierarchy   Potential to Achieve Goals Good   Potential Considerations Co-morbidities   SLP Home Exercise Plan Pt will be independent in HEP as assigned to facilitate carryover of treatment strategies and techniques in home and community environment   Consulted and Agree with Plan of Care Patient        Problem List Patient Active Problem List   Diagnosis Date Noted  . Cerebellar stroke ([place]) 02/28/2015  . Snoring 01/23/2015  . Degeneration of cervical intervertebral disc 01/23/2015  . Essential hypertension 12/14/2014  . Type 2 diabetes mellitus with other circulatory complications ([place]) [id number]  . HLD (hyperlipidemia) 12/14/2014  . Neck pain  12/14/2014  . Cerebral infarction, chronic   . Dizzy   . Vertigo 10/14/2014  . Multi-infarct state ([place]) 10/14/2014  . CVA (cerebral infarction) 10/14/2014  . Impingement syndrome of left shoulder 08/02/2014  . 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 Abdominal pain 06/13/2014  . Rectal bleeding 03/13/2014  . Diabetes type 2, controlled ([place]) 02/18/2014  . Hyperlipidemia 02/18/2014  . Diabetic peripheral neuropathy ([place]) 03/28/2013  . Irreducible incisional hernia 11/11/2010   Thank you,  Genene Churn, CCC-SLP [phone]  Genene Churn 03/18/2015, 5:02 PM  [facility] [address] [place], [place], [address] Phone: [phone]   Fax:  [phone]   Name: Travis [name] MRN: [id number] Date of Birth: 1961-03-04

## 2015-03-19 ENCOUNTER — Encounter: Payer: Self-pay | Admitting: Family Medicine

## 2015-03-19 ENCOUNTER — Telehealth: Payer: Self-pay | Admitting: Neurology

## 2015-03-19 DIAGNOSIS — G4733 Obstructive sleep apnea (adult) (pediatric): Secondary | ICD-10-CM | POA: Insufficient documentation

## 2015-03-19 LAB — HYPERCOAGULABLE PANEL, COMPREHENSIVE
ACT. PRT C RESIST W/FV DEFIC.: 2.7 ratio
APTT: 24.9 s
AT III ACT/NOR PPP CHRO: 112 %
Anticardiolipin Ab, IgM: <10 [MPL'U]
Beta-2 Glycoprotein I, IgA: <10 SAU
Beta-2 Glycoprotein I, IgG: <10 SGU
Beta-2 Glycoprotein I, IgM: <10 SMU
DRVVT SCREEN SECONDS: 30.7 s
Factor VII Antigen**: 117 %
Factor VIII Activity: 125 %
HEXAGONAL PHOSPHOLIPID NEUTRAL: 7 s
Homocysteine: 11.8 umol/L
PROT C AG ACT/NOR PPP IMM: 87 %
PROT S AG ACT/NOR PPP IMM: 112 %
Protein C Ag/FVII Ag Ratio**: 0.7 ratio
Protein S Ag/FVII Ag Ratio**: 1 ratio

## 2015-03-19 LAB — MICROALBUMIN / CREATININE URINE RATIO
Creatinine, Urine: 90.1 mg/dL
Microalbumin, Urine: <3 ug/mL

## 2015-03-19 NOTE — Telephone Encounter (Signed)
Patient referred by Dr. Erlinda Hong, seen by me on 02/13/15, diagnostic PSG on 03/14/15, ins: UMR.    Please call and notify the patient that the recent sleep study did confirm the diagnosis of obstructive sleep apnea. OSA is overall mild, but worth treating to see if he feels better after treatment (especially in light of his sleepiness, morning HAs and history of stroke). To that end I recommend treatment for this in the form of autoPAP, which means, that we don't have to bring him back for a second sleep study with CPAP, but will let him try an autoPAP machine at home, through a DME company (of his choice, or as per insurance requirement). The DME representative will educate him on how to use the machine, how to put the mask on, etc. I have placed an order in the chart. Please send referral, talk to patient, send report to PCP and referring MD. We will need a FU in sleep clinic for 8 to 10 weeks post-PAP set up, please arrange that as well. Thanks,   Star Age, MD, PhD [facility])

## 2015-03-19 NOTE — Telephone Encounter (Signed)
I spoke to wife and gave results and recommendation. She voiced understanding and stated that patient would like to proceed with treatment. Requested [facility].  Will fax report to PCP and send patient a letter reminding him to make appt and stress importance of compliance.

## 2015-03-20 ENCOUNTER — Ambulatory Visit (HOSPITAL_COMMUNITY): Payer: 59 | Admitting: Speech Pathology

## 2015-03-20 ENCOUNTER — Ambulatory Visit (HOSPITAL_COMMUNITY): Payer: 59

## 2015-03-20 DIAGNOSIS — R279 Unspecified lack of coordination: Secondary | ICD-10-CM

## 2015-03-20 DIAGNOSIS — R262 Difficulty in walking, not elsewhere classified: Secondary | ICD-10-CM

## 2015-03-20 DIAGNOSIS — R2681 Unsteadiness on feet: Secondary | ICD-10-CM

## 2015-03-20 DIAGNOSIS — R471 Dysarthria and anarthria: Secondary | ICD-10-CM

## 2015-03-20 DIAGNOSIS — R29898 Other symptoms and signs involving the musculoskeletal system: Secondary | ICD-10-CM

## 2015-03-20 DIAGNOSIS — R278 Other lack of coordination: Secondary | ICD-10-CM

## 2015-03-20 DIAGNOSIS — Z9181 History of falling: Secondary | ICD-10-CM

## 2015-03-20 DIAGNOSIS — I69919 Unspecified symptoms and signs involving cognitive functions following unspecified cerebrovascular disease: Secondary | ICD-10-CM

## 2015-03-20 DIAGNOSIS — R269 Unspecified abnormalities of gait and mobility: Secondary | ICD-10-CM

## 2015-03-20 NOTE — Therapy (Signed)
[name], [name], [address] Phone: [phone]   Fax:  [phone]  Speech Language Pathology Treatment  Patient Details  Name: Travis [name] MRN: [id number] Date of Birth: [birth year] Referring Provider: Dr. Sallee [name]  Encounter Date: 03/20/2015      End of Session - 03/20/15 1954    Visit Number 4   Number of Visits 8   Date for SLP Re-Evaluation 04/17/15   Authorization Type UMR   SLP Start Time 1600   SLP Stop Time  1645   SLP Time Calculation (min) 45 min   Activity Tolerance Patient tolerated treatment well      Past Medical History  Diagnosis Date  . Night sweats     every once in a while  . Hyperlipidemia   . Diabetes mellitus   . Hernia, inguinal   . Dizziness   . Chronic headaches   . Arthritis   . Neuropathy of lower extremity   . Diabetic peripheral neuropathy ([name]) 03/28/2013  . Diverticulosis   . Hypertension   . Complication of anesthesia     bleed after last shoulder-had to stay overnight  . Impingement syndrome of left shoulder 08/02/2014  . 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 Multi-infarct state ([place]) 10/14/2014  . Depression   . Anxiety     Past Surgical History  Procedure Laterality Date  . Inguinal hernia repair Bilateral   . Cervical disc surgery    . Wrist surgery Right     fusion  . Shoulder arthroscopy Right     1  . Knee arthroscopy Right   . Colon surgery  2003    1/3 removed for diverticulitis  . Colonoscopy    . Umbilical hernia repair      with other hernia repair with mesh  . Shoulder arthroscopy Left 08/02/2014    Procedure: LEFT SHOULDER SCOPE DEBRIDEMENT/ACROMIOPLASTY;  Surgeon: Travis Bond, MD;  Location: [facility];  Service: Orthopedics;  Laterality: Left;  ANESTHESIA: GENERAL, PRE/POST OP SCALENE  . Tee without cardioversion N/A 03/03/2015    Procedure: TRANSESOPHAGEAL ECHOCARDIOGRAM (TEE);  Surgeon: Travis Commons, MD;  [facility];  Service: Cardiology;  Laterality: N/A;    There were no vitals filed for this visit.  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 No/denies               ADULT SLP TREATMENT - 03/20/15 1952    General Information   Behavior/Cognition Alert;Cooperative;Pleasant mood   Patient Positioning Upright in chair   Oral care provided N/A   HPI 54 yo male with past medical history of CVA who presents for cognitive linguistic therapy.   Treatment Provided   Treatment provided Cognitive-Linquistic   Cognitive-Linquistic Treatment   Treatment focused on Dysarthria;Cognition;Patient/family/caregiver education   Skilled Treatment dysarthria/speech intelligibility strategies; use of memory strategies   Assessment / Recommendations / Plan   Plan Continue with current plan of care   Progression Toward Goals   Progression toward goals Progressing toward goals          SLP Education - 03/20/15 1953    Education provided Yes   Education Details Emphasized importance of taking rest breaks and use of compenatory strategies   Person(s) Educated Patient   Methods Explanation;Handout  Comprehension Verbalized understanding;Returned demonstration          SLP Short Term Goals - 03/20/15 1959    SLP SHORT TERM GOAL #1   Title Pt will implement memory strategies in functional tasks for 90% acc with min assist   Time 6   Period Weeks   Status On-going   SLP SHORT TERM GOAL #2   Title Pt will complete mod level thought organization and deductive reasoning tasks with 90% acc with use of strategies and min assist.    Time 6   Period Weeks   Status On-going   SLP SHORT TERM GOAL #3   Title Pt will identify appropriate strategies to use in hypothetical everyday problem solving scenarios with min  assist.    Time 6   Period Weeks   Status On-going   SLP SHORT TERM GOAL #4   Title Pt will implement speech intelligibility strategies with 95% acc during conversations to increase naturalness and intelligibility of speech.   Baseline 75% acc   Time 8   Period Weeks   Status New          SLP Long Term Goals - 03/20/15 1959    SLP LONG TERM GOAL #1   Title Pt will communicate complex thoughts and feelings to Travis [name] with use of strategies. Pt will also be independent with use of memory and thought organization strategies.   Baseline min impairment   Time 3   Period Months   Status New          Plan - 03/20/15 1954    Clinical Impression Statement [name] remembered to bring his homework/therapy folder with him to treatment today. Last session, we did not complete instruction of memory strategies so this was completed this date. SLP provided written strategies, verbal explanation, and sample scenerios where each might be effective. Pt was receptive to strategies and was able to provide examples of how he could implement at home. Speech sounded slightly more dysarthric this session and SLP discussed the effect of fatigue on his speech. He reports that he felt "really good" yesterday and raked leaves for several hours with some rest breaks. SLP also encouraged pt to use association strategies to help him with recall of names (specifically he enjoys watching [employer]). Pt is making good progress toward goals. Next session, target word finding.    Speech Therapy Frequency 2x / week   Duration 4 weeks   Treatment/Interventions Functional tasks;SLP instruction and feedback;Compensatory strategies;Patient/family education;Internal/external aids;Cognitive reorganization;Cueing hierarchy   Potential to Achieve Goals Good   Potential Considerations Co-morbidities   SLP Home Exercise Plan Pt will be independent in HEP as assigned to facilitate carryover of treatment strategies and  techniques in home and community environment   Consulted and Agree with Plan of Care Patient        Problem List Patient Active Problem List   Diagnosis Date Noted  . Obstructive sleep apnea 03/19/2015  . Cerebellar stroke ([place]) 02/28/2015  . Snoring 01/23/2015  . Degeneration of cervical intervertebral disc 01/23/2015  . Essential hypertension 12/14/2014  . Type 2 diabetes mellitus with other circulatory complications ([place]) [id number]  . HLD (hyperlipidemia) 12/14/2014  . Neck pain 12/14/2014  . Cerebral infarction, chronic   . Dizzy   . Vertigo 10/14/2014  . Multi-infarct state ([place]) 10/14/2014  . CVA (cerebral infarction) 10/14/2014  . Impingement syndrome of left shoulder 08/02/2014  . 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 Abdominal pain 06/13/2014  . Rectal  bleeding 03/13/2014  . Diabetes type 2, controlled ([place]) 02/18/2014  . Hyperlipidemia 02/18/2014  . Diabetic peripheral neuropathy ([place]) 03/28/2013  . Irreducible incisional hernia 11/11/2010   Thank you,  Genene Churn, [place]  [facility] 03/20/2015, 8:00 PM  [facility] [address] [place], [name], [address] Phone: [phone]   Fax:  [phone]   Name: Travis [name] MRN: [id number] Date of Birth: 12/19/1960

## 2015-03-20 NOTE — Therapy (Signed)
[place], [place], [address] Phone: [phone]   Fax:  [phone]  Physical Therapy Treatment  Patient Details  Name: Travis [name] MRN: [id number] Date of Birth: 1960/11/07 Referring Provider: [name]  Encounter Date: 03/20/2015      PT End of Session - 03/20/15 1717    Visit Number 5   Number of Visits 18   Date for PT Re-Evaluation 04/04/15   Authorization Type UMR   Authorization Time Period 03/05/15-05/05/15   PT Start Time 1650   PT Stop Time 1732   PT Time Calculation (min) 42 min   Equipment Utilized During Treatment Gait belt   Activity Tolerance Patient tolerated treatment well   Behavior During Therapy [facility] for tasks assessed/performed      Past Medical History  Diagnosis Date  . Night sweats     every once in a while  . Hyperlipidemia   . Diabetes mellitus   . Hernia, inguinal   . Dizziness   . Chronic headaches   . Arthritis   . Neuropathy of lower extremity   . Diabetic peripheral neuropathy ([place]) 03/28/2013  . Diverticulosis   . Hypertension   . Complication of anesthesia     bleed after last shoulder-had to stay overnight  . Impingement syndrome of left shoulder 08/02/2014  . 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 Multi-infarct state ([place]) 10/14/2014  . Depression   . Anxiety     Past Surgical History  Procedure Laterality Date  . Inguinal hernia repair Bilateral   . Cervical disc surgery    . Wrist surgery Right     fusion  . Shoulder arthroscopy Right     1  . Knee arthroscopy Right   . Colon surgery  2003    1/3 removed for diverticulitis  . Colonoscopy    . Umbilical hernia repair      with other hernia repair with mesh  . Shoulder arthroscopy Left 08/02/2014    Procedure: LEFT SHOULDER SCOPE DEBRIDEMENT/ACROMIOPLASTY;  Surgeon: [name], MD;  Location: [facility];  Service: Orthopedics;  Laterality: Left;  ANESTHESIA: GENERAL,  PRE/POST OP SCALENE  . Tee without cardioversion N/A 03/03/2015    Procedure: TRANSESOPHAGEAL ECHOCARDIOGRAM (TEE);  Surgeon: [name], MD;  [facility];  Service: Cardiology;  Laterality: N/A;    There were no vitals filed for this visit.  Visit Diagnosis:  Weakness of right leg  Decreased coordination  Abnormality of gait  Risk for falls  Difficulty walking  Unsteadiness      Subjective Assessment - 03/20/15 1648    Subjective Pt stated his Lt hip and Rt wrist are bothering him today.  Pt stated he has been walking with his walking stick.   Currently in Pain? Yes   Pain Score 5    Pain Location Wrist   Pain Orientation Left   Pain Descriptors / Indicators Aching   Pain Score 5   Pain Location Wrist   Pain Orientation Right   Pain Descriptors / Indicators Aching           [facility] Adult PT Treatment/Exercise - 03/20/15 0001    Knee/Hip Exercises: Standing   Rocker Board 2 minutes   Rocker Board Limitations R/L no HHA   SLS Rt 22", Lt 14 max of 3   Gait Training Gait training with SPC with focus on proper sequencing and heel to toe pattern wtih 2# Rt LE for proprioception;  incorporated Dynamic gait index to assess gait speed with head turns and reaction stop time   Knee/Hip Exercises: Seated   Other Seated Knee/Hip Exercises alternating opposite UE/LE flexion with ab sets; cross-midline reaches while sitting on air pad and with core activitation; catch with ball thrown at long angles to promote core activiation on air pad with mat elevated   Knee/Hip Exercises: Supine   Bridges Both;2 sets;15 reps   Knee/Hip Exercises: Sidelying   Hip ABduction Both;2 sets;15 reps   Knee/Hip Exercises: Prone   Hip Extension Both;2 sets;15 reps           PT Short Term Goals - 03/06/15 0741    PT SHORT TERM GOAL #1   Title Pt will be independent with HEP.   Time 3   Period Weeks   Status New   PT SHORT TERM GOAL #2   Title Increase LE strength to  improve five time sit to stand to 12 seconds or less.    Time 3   Period Weeks   Status New   PT SHORT TERM GOAL #3   Title Decrease fall risk evidenced by TUG time of 15 seconds or less.   Time 3   Period Weeks   Status New   PT SHORT TERM GOAL #4   Title Improve gait speed evidenced by 475 ft or more ambulated during 3 minute walk test.    Time 3   Period Weeks   Status New           PT Long Term Goals - 03/06/15 0744    PT LONG TERM GOAL #1   Title Pt to be independent with advanced HEP.   Time 6   Period Weeks   Status New   PT LONG TERM GOAL #2   Title Improve RLE strength to 4/5 or greater to improve gait mechanics and functional mobility.   Time 6   Period Weeks   Status New   PT LONG TERM GOAL #3   Title Improve BLE strength and power evidenced by five time sit to stand time of 10 seconds or less.   Time 6   Period Weeks   Status New   PT LONG TERM GOAL #4   Title Decrease fall risk evidenced by 13 seconds or less on TUG.    Time 6   Period Weeks   Status New   PT LONG TERM GOAL #5   Title Pt will ambulate 1,000 feet or greater during 6MWT using LRAD or no AD to demonstrate improved gait speed and to return pt to community ambulation.    Time 6   Period Weeks   Status New               Plan - 03/20/15 1719    Clinical Impression Statement Began session with gait training initally with QC and progressed to [facility] with minimal cueing for sequence, heel to toe gait pattern; incorporated dynamic gait index techniques to assess gait speed with head turns and reaction time with stop and go.  Pt demonstrated decreased reaction with stop and go and reduced speed with head turns, 1 LOB episodes during gait training with min A required.  Therex focus on improving proximal musculature with core exercises complete on airex with elevated heights to improve balance reaching outside BOS, crossing midline and reaction timing with throw and catch.  Min verbal and tactile  cueing for form with hip strengthening exercises with prone hip extension and sidelying abduction  All balance training, functional strengthening, and gait activities were performed seperately of each other during this session.    PT Next Visit Plan Continue with gait training with SPC, functional strengthening and balance.  Weight on Rt LE with gait training to improve proprioception with gait.  Progress core strengthening as able.          Problem List Patient Active Problem List   Diagnosis Date Noted  . Obstructive sleep apnea 03/19/2015  . Cerebellar stroke ([place]) 02/28/2015  . Snoring 01/23/2015  . Degeneration of cervical intervertebral disc 01/23/2015  . Essential hypertension 12/14/2014  . Type 2 diabetes mellitus with other circulatory complications ([place]) [id number]  . HLD (hyperlipidemia) 12/14/2014  . Neck pain 12/14/2014  . Cerebral infarction, chronic   . Dizzy   . Vertigo 10/14/2014  . Multi-infarct state ([place]) 10/14/2014  . CVA (cerebral infarction) 10/14/2014  . Impingement syndrome of left shoulder 08/02/2014  . 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 Abdominal pain 06/13/2014  . Rectal bleeding 03/13/2014  . Diabetes type 2, controlled ([place]) 02/18/2014  . Hyperlipidemia 02/18/2014  . Diabetic peripheral neuropathy ([place]) 03/28/2013  . Irreducible incisional hernia 11/11/2010   Ihor Austin, LPTA; [place]  Aldona Lento 03/20/2015, 6:01 PM  [facility] [place], [place], [address] Phone: [phone]   Fax:  [phone]  Name: RICK WILTSE MRN: [id number] Date of Birth: 03-29-1961

## 2015-03-21 ENCOUNTER — Encounter (HOSPITAL_COMMUNITY): Payer: Self-pay | Admitting: Occupational Therapy

## 2015-03-21 ENCOUNTER — Ambulatory Visit (HOSPITAL_COMMUNITY): Payer: 59 | Admitting: Occupational Therapy

## 2015-03-21 DIAGNOSIS — R279 Unspecified lack of coordination: Secondary | ICD-10-CM | POA: Diagnosis not present

## 2015-03-21 DIAGNOSIS — R278 Other lack of coordination: Secondary | ICD-10-CM

## 2015-03-21 DIAGNOSIS — M6281 Muscle weakness (generalized): Secondary | ICD-10-CM

## 2015-03-21 DIAGNOSIS — R29898 Other symptoms and signs involving the musculoskeletal system: Secondary | ICD-10-CM

## 2015-03-21 NOTE — Patient Instructions (Signed)
Home Exercises Program Theraputty Exercises  Do the following exercises 2 times a day using your affected hand.  1. Roll putty into a ball.  2. Make into a pancake.  3. Roll putty into a roll.  4. Pinch along log with first finger and thumb.   5. Make into a ball.  6. Roll it back into a log.   7. Pinch using thumb and side of first finger.  8. Roll into a ball, then flatten into a pancake.  9. Using your fingers, make putty into a mountain.   

## 2015-03-21 NOTE — Therapy (Signed)
[place], [place], [address] Phone: [phone]   Fax:  [phone]  Occupational Therapy Evaluation  Patient Details  Name: Travis [name] MRN: [id number] Date of Birth: 06-29-1960 Referring Provider: Dr. [name]  Encounter Date: 03/21/2015      OT End of Session - 03/21/15 1625    Visit Number 1   Number of Visits 6   Date for OT Re-Evaluation 05/20/15  mini reassess 04/18/15   Authorization Type UMR no visit limit   OT Start Time 1533   OT Stop Time 1615   OT Time Calculation (min) 42 min   Activity Tolerance Patient tolerated treatment well   Behavior During Therapy [facility] for tasks assessed/performed      Past Medical History  Diagnosis Date  . Night sweats     every once in a while  . Hyperlipidemia   . Diabetes mellitus   . Hernia, inguinal   . Dizziness   . Chronic headaches   . Arthritis   . Neuropathy of lower extremity   . Diabetic peripheral neuropathy ([place]) 03/28/2013  . Diverticulosis   . Hypertension   . Complication of anesthesia     bleed after last shoulder-had to stay overnight  . Impingement syndrome of left shoulder 08/02/2014  . 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 Multi-infarct state ([place]) 10/14/2014  . Depression   . Anxiety     Past Surgical History  Procedure Laterality Date  . Inguinal hernia repair Bilateral   . Cervical disc surgery    . Wrist surgery Right     fusion  . Shoulder arthroscopy Right     1  . Knee arthroscopy Right   . Colon surgery  2003    1/3 removed for diverticulitis  . Colonoscopy    . Umbilical hernia repair      with other hernia repair with mesh  . Shoulder arthroscopy Left 08/02/2014    Procedure: LEFT SHOULDER SCOPE DEBRIDEMENT/ACROMIOPLASTY;  Surgeon: [name], MD;  Location: [place];  Service: Orthopedics;  Laterality: Left;  ANESTHESIA: GENERAL, PRE/POST OP SCALENE  . Tee without cardioversion  N/A 03/03/2015    Procedure: TRANSESOPHAGEAL ECHOCARDIOGRAM (TEE);  Surgeon: [name], MD;  [facility];  Service: Cardiology;  Laterality: N/A;    There were no vitals filed for this visit.  Visit Diagnosis:  Decreased grip strength of right hand  Decreased coordination  Muscle weakness of right upper extremity      Subjective Assessment - 03/21/15 1623    Subjective  S: I can't seem to steady this arm sometimes.    Pertinent History Pt is a 54 y/o male s/p CVA on 02/28/15 presenting with weakness and decreased coordination in the RUE. Pt was referred to occupational therapy for evaluation and treatment by Dr. Wolfgang Phoenix.    Patient Stated Goals to be able to use my right arm like normal   Currently in Pain? No/denies           [facility] OT Assessment - 03/21/15 1535    Assessment   Diagnosis CVA   Referring Provider Dr. [name]   Onset Date 02/28/15   Prior Therapy therapy for Right RCR, none for CVA   Precautions   Precautions Fall   Balance Screen   Has the patient fallen in the past 6 months No   Has the patient had a decrease in activity level because of a fear of  falling?  No   Is the patient reluctant to leave their home because of a fear of falling?  No   Home  Environment   Family/patient expects to be discharged to: Private residence   Living Arrangements Spouse/significant other   Prior Function   Level of [place] Enjoys yardwork, walking   ADL   ADL comments Pt is having difficulty with grooming tasks, feeding tasks, fine motor coordination tasks involving RUE.   Written Expression   Dominant Hand Right   Cognition   Overall Cognitive Status Within Functional Limits for tasks assessed   Sensation   Light Touch Appears Intact   Coordination   9 Hole Peg Test Right;Left   Right 9 Hole Peg Test 33.77"   Left 9 Hole Peg Test 27.19"   Coordination Pt reports having to use his left  hand to steady his right when completing daily tasks involving coordination   ROM / Strength   AROM / PROM / Strength Strength   Strength   Overall Strength Comments Assessed seated, ER/IR abducted    Strength Assessment Site Hand;Wrist;Shoulder;Elbow;Forearm   Right/Left Shoulder Right   Right Shoulder Flexion 4+/5   Right Shoulder ABduction 4/5   Right Shoulder Internal Rotation 4/5   Right Shoulder External Rotation 4+/5   Left Shoulder Flexion 4+/5   Left Shoulder ABduction 4+/5   Left Shoulder Internal Rotation 4/5   Left Shoulder External Rotation 4+/5   Right/Left Elbow Right   Right Elbow Flexion 4/5   Right Elbow Extension 4/5   Right/Left Forearm Right   Right Forearm Pronation 4-/5   Right Forearm Supination 4-/5   Right/Left Wrist Right   Right Wrist Flexion 4-/5   Right Wrist Extension 4-/5   Right/Left hand Right;Left   Right Hand Grip (lbs) 61   Right Hand Lateral Pinch 17 lbs   Right Hand 3 Point Pinch 18 lbs   Left Hand Grip (lbs) 89   Left Hand Lateral Pinch 18 lbs   Left Hand 3 Point Pinch 18 lbs                         OT Education - 03/21/15 1623    Education provided Yes   Education Details green theraputty exercises   Person(s) Educated Patient   Methods Explanation;Demonstration;Handout   Comprehension Verbalized understanding;Returned demonstration          OT Short Term Goals - 03/21/15 1631    OT SHORT TERM GOAL #1   Title Pt will be educated on and independent in HEP.    Time 6   Period Weeks   Status New   OT SHORT TERM GOAL #2   Title Pt will return to prior level of functioning and independence in daily tasks, using RUE as dominant.    Time 6   Period Weeks   Status New   OT SHORT TERM GOAL #3   Title Pt will increase RUE grip strength by 20# to increase ability to hold onto 2 liter drink bottles.    Time 6   Period Weeks   Status New   OT SHORT TERM GOAL #4   Title Pt will increase fine motor coordination  to improve ability to brush his teeth using RUE, by completing 9 hole peg test in under 28".    Time 6   Period Weeks   Status New   OT SHORT TERM GOAL #5  Title Pt will increase RUE wrist and forearm strength to 4+/5 to increase ability to rake the yard using RUE as dominant.    Time 6   Period Weeks   Status New           OT Long Term Goals - 12/19/14 1629    OT LONG TERM GOAL #1   Title Patient will return to highest level of independence with all daily, leisure, and work tasks.    Time 8   Period Weeks   Status Achieved   OT LONG TERM GOAL #2   Title Patient will decrease pain to 2/10 or less during daily tasks.    Time 8   Period Weeks   OT LONG TERM GOAL #3   Title Patient will increase AROM of LUE to WNL to increase ability to complete overhead tasks at work.    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    Time 8   Period Weeks   Status Partially Met   OT LONG TERM GOAL #5   Title Patient will decrease fascial restrictions to trace amount.    Time 8   Period Weeks               Plan - 03/21/15 1625    Clinical Impression Statement A: Pt is a 54 y/o male s/p CVA on 02/28/15 presenting with decreased strength, decreased grip strength, and decreased coordination of the RUE limiting his ability to complete daily and leisure tasks at his highest level of functioning. Pt reports he uses his left hand to steady his right hand when lifting objects and in completion of grooming tasks such as shaving. Pt does report pain in right wrist at times. Educated pt on green theraputty HEP.    Pt will benefit from skilled therapeutic intervention in order to improve on the following deficits (Retired) Decreased strength;Pain;Impaired UE functional use;Decreased coordination   Rehab Potential Excellent   OT Frequency 1x / week   OT Duration 6 weeks   OT  Treatment/Interventions Self-care/ADL training;Patient/family education;Therapeutic exercise;Manual Therapy;Therapeutic activities   Plan P: Pt would benefit from skilled OT services to increase strength, grip strength, and fine motor coordination in the RUE, increasing ability to use RUE as dominant. Treatment plan: RUE elbow, forearm, and wrist strengthening, grip strengthening tasks, fine motor coordination tasks, HEP.    OT Home Exercise Plan green theraputty exercises    Consulted and Agree with Plan of Care Patient        Problem List Patient Active Problem List   Diagnosis Date Noted  . Obstructive sleep apnea 03/19/2015  . Cerebellar stroke ([place]) 02/28/2015  . Snoring 01/23/2015  . Degeneration of cervical intervertebral disc 01/23/2015  . Essential hypertension 12/14/2014  . Type 2 diabetes mellitus with other circulatory complications ([place]) 28/00/3491  . HLD (hyperlipidemia) 12/14/2014  . Neck pain 12/14/2014  . Cerebral infarction, chronic   . Dizzy   . Vertigo 10/14/2014  . Multi-infarct state ([place]) 10/14/2014  . CVA (cerebral infarction) 10/14/2014  . Impingement syndrome of left shoulder 08/02/2014  . 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 Abdominal pain 06/13/2014  . Rectal bleeding 03/13/2014  . Diabetes type 2, controlled ([place]) 02/18/2014  . Hyperlipidemia 02/18/2014  . Diabetic peripheral neuropathy ([place]) 03/28/2013  . Irreducible incisional hernia 11/11/2010    Guadelupe Sabin, OTR/L  [phone]  03/21/2015, 4:36 PM  [place] [address] [place], [place], [address] Phone: [phone]   Fax:  [phone]  Name: Travis [name] MRN: [id number] Date of Birth: [birth year]

## 2015-03-25 ENCOUNTER — Ambulatory Visit (HOSPITAL_COMMUNITY): Payer: 59 | Admitting: Speech Pathology

## 2015-03-25 ENCOUNTER — Ambulatory Visit (HOSPITAL_COMMUNITY): Payer: 59

## 2015-03-25 DIAGNOSIS — R471 Dysarthria and anarthria: Secondary | ICD-10-CM

## 2015-03-25 DIAGNOSIS — R279 Unspecified lack of coordination: Secondary | ICD-10-CM | POA: Diagnosis not present

## 2015-03-25 DIAGNOSIS — R29898 Other symptoms and signs involving the musculoskeletal system: Secondary | ICD-10-CM

## 2015-03-25 DIAGNOSIS — R42 Dizziness and giddiness: Secondary | ICD-10-CM

## 2015-03-25 DIAGNOSIS — R262 Difficulty in walking, not elsewhere classified: Secondary | ICD-10-CM

## 2015-03-25 DIAGNOSIS — R2681 Unsteadiness on feet: Secondary | ICD-10-CM

## 2015-03-25 DIAGNOSIS — R269 Unspecified abnormalities of gait and mobility: Secondary | ICD-10-CM

## 2015-03-25 DIAGNOSIS — Z9181 History of falling: Secondary | ICD-10-CM

## 2015-03-25 DIAGNOSIS — I69919 Unspecified symptoms and signs involving cognitive functions following unspecified cerebrovascular disease: Secondary | ICD-10-CM

## 2015-03-25 NOTE — Therapy (Signed)
[place], [place], [address] Phone: [phone]   Fax:  [phone]  Physical Therapy Treatment  Patient Details  Name: Travis [name] MRN: [id number] Date of Birth: [birth year] Referring Provider: [name]  Encounter Date: 03/25/2015      PT End of Session - 03/25/15 1734    Visit Number 6   Number of Visits 18   Date for PT Re-Evaluation 04/04/15   Authorization Type UMR   Authorization Time Period 03/05/15-05/05/15   PT Start Time 1650   PT Stop Time 1732   PT Time Calculation (min) 42 min   Equipment Utilized During Treatment Gait belt   Activity Tolerance Patient tolerated treatment well   Behavior During Therapy [facility] for tasks assessed/performed      Past Medical History  Diagnosis Date  . Night sweats     every once in a while  . Hyperlipidemia   . Diabetes mellitus   . Hernia, inguinal   . Dizziness   . Chronic headaches   . Arthritis   . Neuropathy of lower extremity   . Diabetic peripheral neuropathy ([place]) 03/28/2013  . Diverticulosis   . Hypertension   . Complication of anesthesia     bleed after last shoulder-had to stay overnight  . Impingement syndrome of left shoulder 08/02/2014  . 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 Multi-infarct state ([place]) 10/14/2014  . Depression   . Anxiety     Past Surgical History  Procedure Laterality Date  . Inguinal hernia repair Bilateral   . Cervical disc surgery    . Wrist surgery Right     fusion  . Shoulder arthroscopy Right     1  . Knee arthroscopy Right   . Colon surgery  2003    1/3 removed for diverticulitis  . Colonoscopy    . Umbilical hernia repair      with other hernia repair with mesh  . Shoulder arthroscopy Left 08/02/2014    Procedure: LEFT SHOULDER SCOPE DEBRIDEMENT/ACROMIOPLASTY;  Surgeon: [name], MD;  Location: [place];  Service: Orthopedics;  Laterality: Left;  ANESTHESIA: GENERAL,  PRE/POST OP SCALENE  . Tee without cardioversion N/A 03/03/2015    Procedure: TRANSESOPHAGEAL ECHOCARDIOGRAM (TEE);  Surgeon: [name], MD;  [facility];  Service: Cardiology;  Laterality: N/A;    There were no vitals filed for this visit.  Visit Diagnosis:  Weakness of right leg  Abnormality of gait  Risk for falls  Difficulty walking  Unsteadiness  Vertigo      Subjective Assessment - 03/25/15 1649    Subjective Pt stated he continues to have pain Lt hip and Rt wrist are bothering him today, pain scale 5/10.  Pt reports he has ordered a hurrycane but it has not came in yet, continues to walk with walking stick at home.    Currently in Pain? Yes   Pain Score 5    Pain Location Hip   Pain Orientation Left   Pain Descriptors / Indicators Sore;Tender   Pain Score 5   Pain Location Wrist   Pain Orientation Right   Pain Descriptors / Indicators Sore;Tender                         [facility] Adult PT Treatment/Exercise - 03/25/15 0001    Knee/Hip Exercises: Standing   Gait Training Gait training with SPC x 552 with min cueing for sequence  with 2 point step pattern   Other Standing Knee Exercises  c   Knee/Hip Exercises: Sidelying   Hip ABduction Both;2 sets;15 reps   Hip ABduction Limitations 3#   Knee/Hip Exercises: Prone   Hip Extension Both;2 sets;15 reps   Hip Extension Limitations 3#             Balance Exercises - 03/25/15 1726    Balance Exercises: Standing   Tandem Stance Eyes open;3 reps;30 secs;Foam/compliant surface  on Airex   SLS Eyes open;3 reps  Rt 48", Lt 33"   Rockerboard Anterior/posterior;Lateral  2 min each no HHA   Sidestepping 2 reps;Theraband  RTB   Marching Limitations 10 x 5" holds on Airex             PT Short Term Goals - 03/06/15 0741    PT SHORT TERM GOAL #1   Title Pt will be independent with HEP.   Time 3   Period Weeks   Status New   PT SHORT TERM GOAL #2   Title Increase LE  strength to improve five time sit to stand to 12 seconds or less.    Time 3   Period Weeks   Status New   PT SHORT TERM GOAL #3   Title Decrease fall risk evidenced by TUG time of 15 seconds or less.   Time 3   Period Weeks   Status New   PT SHORT TERM GOAL #4   Title Improve gait speed evidenced by 475 ft or more ambulated during 3 minute walk test.    Time 3   Period Weeks   Status New           PT Long Term Goals - 03/06/15 0744    PT LONG TERM GOAL #1   Title Pt to be independent with advanced HEP.   Time 6   Period Weeks   Status New   PT LONG TERM GOAL #2   Title Improve RLE strength to 4/5 or greater to improve gait mechanics and functional mobility.   Time 6   Period Weeks   Status New   PT LONG TERM GOAL #3   Title Improve BLE strength and power evidenced by five time sit to stand time of 10 seconds or less.   Time 6   Period Weeks   Status New   PT LONG TERM GOAL #4   Title Decrease fall risk evidenced by 13 seconds or less on TUG.    Time 6   Period Weeks   Status New   PT LONG TERM GOAL #5   Title Pt will ambulate 1,000 feet or greater during 6MWT using LRAD or no AD to demonstrate improved gait speed and to return pt to community ambulation.    Time 6   Period Weeks   Status New               Plan - 03/25/15 1736    Clinical Impression Statement Pt improving sequence with SPC, able to demionstrate 2 point step pattern with minimal cueing required, no LOB episodes noted.  Therex complete following gait training for gluteal strengthening, able to add 3# with abduciton with min verbal for control with form.  Progressed balance training this session on dynamic surface with min A required to reduce risk of fall on Airex.  End of session pt limited by fatigue, no reports of increased pain through session.  Pt is working well toward goals with improved gait mechanics and  increased SLS time Bil LE   PT Next Visit Plan Continue with gait training with  SPC, functional strengthening and balance.         Problem List Patient Active Problem List   Diagnosis Date Noted  . Obstructive sleep apnea 03/19/2015  . Cerebellar stroke ([place]) 02/28/2015  . Snoring 01/23/2015  . Degeneration of cervical intervertebral disc 01/23/2015  . Essential hypertension 12/14/2014  . Type 2 diabetes mellitus with other circulatory complications ([place]) [id number]  . HLD (hyperlipidemia) 12/14/2014  . Neck pain 12/14/2014  . Cerebral infarction, chronic   . Dizzy   . Vertigo 10/14/2014  . Multi-infarct state ([place]) 10/14/2014  . CVA (cerebral infarction) 10/14/2014  . Impingement syndrome of left shoulder 08/02/2014  . 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 Abdominal pain 06/13/2014  . Rectal bleeding 03/13/2014  . Diabetes type 2, controlled ([place]) 02/18/2014  . Hyperlipidemia 02/18/2014  . Diabetic peripheral neuropathy ([place]) 03/28/2013  . Irreducible incisional hernia 11/11/2010   Ihor Austin, LPTA; [place]  Aldona Lento 03/25/2015, 5:48 PM  [place] [address] [place], [place], [address] Phone: [phone]   Fax:  [phone]  Name: CHAYTEN HOVANEC MRN: [id number] Date of Birth: 1961-01-26

## 2015-03-25 NOTE — Therapy (Signed)
[place], [place], [address] Phone: [phone]   Fax:  [phone]  Speech Language Pathology Treatment  Patient Details  Name: Travis [name] MRN: [id number] Date of Birth: [birth year] Referring Provider: Dr. [name]  Encounter Date: 03/25/2015      End of Session - 03/25/15 1827    Visit Number 5   Number of Visits 8   Date for SLP Re-Evaluation 04/17/15   Authorization Type UMR   SLP Start Time 1600   SLP Stop Time  1645   SLP Time Calculation (min) 45 min   Activity Tolerance Patient tolerated treatment well      Past Medical History  Diagnosis Date  . Night sweats     every once in a while  . Hyperlipidemia   . Diabetes mellitus   . Hernia, inguinal   . Dizziness   . Chronic headaches   . Arthritis   . Neuropathy of lower extremity   . Diabetic peripheral neuropathy ([place]) 03/28/2013  . Diverticulosis   . Hypertension   . Complication of anesthesia     bleed after last shoulder-had to stay overnight  . Impingement syndrome of left shoulder 08/02/2014  . 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 Multi-infarct state ([place]) 10/14/2014  . Depression   . Anxiety     Past Surgical History  Procedure Laterality Date  . Inguinal hernia repair Bilateral   . Cervical disc surgery    . Wrist surgery Right     fusion  . Shoulder arthroscopy Right     1  . Knee arthroscopy Right   . Colon surgery  2003    1/3 removed for diverticulitis  . Colonoscopy    . Umbilical hernia repair      with other hernia repair with mesh  . Shoulder arthroscopy Left 08/02/2014    Procedure: LEFT SHOULDER SCOPE DEBRIDEMENT/ACROMIOPLASTY;  Surgeon: [name], MD;  Location: [place];  Service: Orthopedics;  Laterality: Left;  ANESTHESIA: GENERAL, PRE/POST OP SCALENE  . Tee without cardioversion N/A 03/03/2015    Procedure: TRANSESOPHAGEAL ECHOCARDIOGRAM (TEE);  Surgeon: [name], MD;  [facility];  Service: Cardiology;  Laterality: N/A;    There were no vitals filed for this visit.  Visit Diagnosis: Cognitive deficits as late effect of cerebrovascular disease  Dysarthria      Subjective Assessment - 03/25/15 1825    Subjective "I forgot my folder!"   Currently in Pain? No/denies               ADULT SLP TREATMENT - 03/25/15 1826    General Information   Behavior/Cognition Alert;Cooperative;Pleasant mood   Patient Positioning Upright in chair   Oral care provided N/A   HPI 54 yo male with past medical history of CVA who presents for cognitive linguistic therapy.   Pain Assessment   Pain Assessment No/denies pain   Cognitive-Linquistic Treatment   Treatment focused on Dysarthria;Cognition;Patient/family/caregiver education   Skilled Treatment dysarthria/speech intelligibility strategies; use of memory strategies   Assessment / Recommendations / Plan   Plan Continue with current plan of care   Progression Toward Goals   Progression toward goals Progressing toward goals            SLP Short Term Goals - 03/25/15 1830    SLP SHORT TERM GOAL #1   Title Pt will implement memory strategies in functional tasks for 90% acc with min assist  Time 6   Period Weeks   Status On-going   SLP SHORT TERM GOAL #2   Title Pt will complete mod level thought organization and deductive reasoning tasks with 90% acc with use of strategies and min assist.    Time 6   Period Weeks   Status On-going   SLP SHORT TERM GOAL #3   Title Pt will identify appropriate strategies to use in hypothetical everyday problem solving scenarios with min assist.    Time 6   Period Weeks   Status On-going   SLP SHORT TERM GOAL #4   Title Pt will implement speech intelligibility strategies with 95% acc during conversations to increase naturalness and intelligibility of speech.   Baseline 75% acc   Time 8   Period Weeks   Status New           SLP Long Term Goals - 03/25/15 1831    SLP LONG TERM GOAL #1   Title Pt will communicate complex thoughts and feelings to [facility] with use of strategies. Pt will also be independent with use of memory and thought organization strategies.   Baseline min impairment   Time 3   Period Months   Status New          Plan - 03/25/15 1827    Clinical Impression Statement [name] forgot his folder in his wife's car today. He was able to recall 7/8 characters from his favorite TV show when given min cues from SLP. Speech intelligibility was judged to be 95% intelligibile in conversation with min reminder for pacing. Moderate level thought organization task was introduced today and pt required mild/moderate verbal cues from SLP for completion. He was given an easier level to complete for homework. Continue POC.    Speech Therapy Frequency 2x / week   Duration 4 weeks   Treatment/Interventions Functional tasks;SLP instruction and feedback;Compensatory strategies;Patient/family education;Internal/external aids;Cognitive reorganization;Cueing hierarchy   Potential to Achieve Goals Good   Potential Considerations Co-morbidities   SLP Home Exercise Plan Pt will be independent in HEP as assigned to facilitate carryover of treatment strategies and techniques in home and community environment   Consulted and Agree with Plan of Care Patient        Problem List Patient Active Problem List   Diagnosis Date Noted  . Obstructive sleep apnea 03/19/2015  . Cerebellar stroke ([place]) 02/28/2015  . Snoring 01/23/2015  . Degeneration of cervical intervertebral disc 01/23/2015  . Essential hypertension 12/14/2014  . Type 2 diabetes mellitus with other circulatory complications ([place]) [id number]  . HLD (hyperlipidemia) 12/14/2014  . Neck pain 12/14/2014  . Cerebral infarction, chronic   . Dizzy   . Vertigo 10/14/2014  . Multi-infarct state ([place]) 10/14/2014  . CVA (cerebral infarction) 10/14/2014  .  Impingement syndrome of left shoulder 08/02/2014  . 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 Abdominal pain 06/13/2014  . Rectal bleeding 03/13/2014  . Diabetes type 2, controlled ([place]) 02/18/2014  . Hyperlipidemia 02/18/2014  . Diabetic peripheral neuropathy ([place]) 03/28/2013  . Irreducible incisional hernia 11/11/2010   Thank you,  Genene Churn, CCC-SLP [phone]  [facility] 03/25/2015, 6:32 PM  [place] [address] [place], [place], [address] Phone: [phone]   Fax:  [phone]   Name: Travis [name] MRN: [id number] Date of Birth: 1960-11-04

## 2015-03-26 ENCOUNTER — Other Ambulatory Visit: Payer: Self-pay | Admitting: Family Medicine

## 2015-03-31 ENCOUNTER — Ambulatory Visit (HOSPITAL_COMMUNITY): Payer: 59 | Admitting: Physical Therapy

## 2015-04-01 ENCOUNTER — Ambulatory Visit (HOSPITAL_COMMUNITY): Payer: 59 | Admitting: Physical Therapy

## 2015-04-01 ENCOUNTER — Ambulatory Visit (HOSPITAL_COMMUNITY): Payer: 59 | Admitting: Speech Pathology

## 2015-04-01 ENCOUNTER — Telehealth: Payer: Self-pay | Admitting: Family Medicine

## 2015-04-01 DIAGNOSIS — R279 Unspecified lack of coordination: Secondary | ICD-10-CM | POA: Diagnosis not present

## 2015-04-01 DIAGNOSIS — I69919 Unspecified symptoms and signs involving cognitive functions following unspecified cerebrovascular disease: Secondary | ICD-10-CM

## 2015-04-01 DIAGNOSIS — R2681 Unsteadiness on feet: Secondary | ICD-10-CM

## 2015-04-01 DIAGNOSIS — R262 Difficulty in walking, not elsewhere classified: Secondary | ICD-10-CM

## 2015-04-01 DIAGNOSIS — Z9181 History of falling: Secondary | ICD-10-CM

## 2015-04-01 DIAGNOSIS — R269 Unspecified abnormalities of gait and mobility: Secondary | ICD-10-CM

## 2015-04-01 DIAGNOSIS — R29898 Other symptoms and signs involving the musculoskeletal system: Secondary | ICD-10-CM

## 2015-04-01 NOTE — Telephone Encounter (Signed)
May refill each medication for 6 months

## 2015-04-01 NOTE — Therapy (Signed)
[place], [place], [address] Phone: [phone]   Fax:  [phone]  Speech Language Pathology Treatment  Patient Details  Name: Travis [name] MRN: [id number] Date of Birth: May 27, 1960 Referring Provider: Dr. [name]  Encounter Date: 04/01/2015      End of Session - 04/01/15 1800    Visit Number 6   Number of Visits 8   Date for SLP Re-Evaluation 04/17/15   Authorization Type UMR   SLP Start Time 1600   SLP Stop Time  [id number]   SLP Time Calculation (min) 43 min   Activity Tolerance Patient tolerated treatment well      Past Medical History  Diagnosis Date  . Night sweats     every once in a while  . Hyperlipidemia   . Diabetes mellitus   . Hernia, inguinal   . Dizziness   . Chronic headaches   . Arthritis   . Neuropathy of lower extremity   . Diabetic peripheral neuropathy ([place]) 03/28/2013  . Diverticulosis   . Hypertension   . Complication of anesthesia     bleed after last shoulder-had to stay overnight  . Impingement syndrome of left shoulder 08/02/2014  . 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 Multi-infarct state ([place]) 10/14/2014  . Depression   . Anxiety     Past Surgical History  Procedure Laterality Date  . Inguinal hernia repair Bilateral   . Cervical disc surgery    . Wrist surgery Right     fusion  . Shoulder arthroscopy Right     1  . Knee arthroscopy Right   . Colon surgery  2003    1/3 removed for diverticulitis  . Colonoscopy    . Umbilical hernia repair      with other hernia repair with mesh  . Shoulder arthroscopy Left 08/02/2014    Procedure: LEFT SHOULDER SCOPE DEBRIDEMENT/ACROMIOPLASTY;  Surgeon: [name], MD;  Location: [facility];  Service: Orthopedics;  Laterality: Left;  ANESTHESIA: GENERAL, PRE/POST OP SCALENE  . Tee without cardioversion N/A 03/03/2015    Procedure: TRANSESOPHAGEAL ECHOCARDIOGRAM (TEE);  Surgeon: [name], MD;  [facility];  Service: Cardiology;  Laterality: N/A;    There were no vitals filed for this visit.  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 No/denies               ADULT SLP TREATMENT - 04/01/15 1758    General Information   Behavior/Cognition Alert;Cooperative;Pleasant mood   Patient Positioning Upright in chair   Oral care provided N/A   HPI 54 yo male with past medical history of CVA who presents for cognitive linguistic therapy.   Treatment Provided   Treatment provided Cognitive-Linquistic   Pain Assessment   Pain Assessment No/denies pain   Cognitive-Linquistic Treatment   Treatment focused on Cognition;Patient/family/caregiver education   Skilled Treatment flexibility in thinking, thought organization, problem solving, planning   Assessment / Recommendations / [place] with current plan of care   Progression Toward Goals   Progression toward goals Progressing toward goals          SLP Education - 04/01/15 1759    Education provided Yes   Education Details HEP- thought organization and planning strategies   Person(s)  Educated Patient   Methods Explanation;Handout   Comprehension Verbalized understanding          SLP Short Term Goals - 04/01/15 1807    SLP SHORT TERM GOAL #1   Title Pt will implement memory strategies in functional tasks for 90% acc with min assist   Time 6   Period Weeks   Status On-going   SLP SHORT TERM GOAL #2   Title Pt will complete mod level thought organization and deductive reasoning tasks with 90% acc with use of strategies and min assist.    Time 6   Period Weeks   Status On-going   SLP SHORT TERM GOAL #3   Title Pt will identify appropriate strategies to use in hypothetical everyday problem solving scenarios  with min assist.    Time 6   Period Weeks   Status On-going   SLP SHORT TERM GOAL #4   Title Pt will implement speech intelligibility strategies with 95% acc during conversations to increase naturalness and intelligibility of speech.   Baseline 75% acc   Time 8   Period Weeks   Status New          SLP Long Term Goals - 04/01/15 1807    SLP LONG TERM GOAL #1   Title Pt will communicate complex thoughts and feelings to [facility] with use of strategies. Pt will also be independent with use of memory and thought organization strategies.   Baseline min impairment   Time 3   Period Months   Status On-going          Plan - 04/01/15 1800    Clinical Impression Statement Mr. Klinker completed what he was able to do for his homework. He was assigned deduction puzzles which he completed one with 100% acc. He had difficulty with the second puzzle and completed about half of it (in pen). SLP provided mod/max verbal cues and moderate visual cues for completion. He was encouraged to complete assignments in pencil to allow for erasing of mistakes. He was also cued to read through the problems aloud to gain an overview and "walk through" steps before writing anything down. In an additional thought organization task, he completed it with 90% acc when given min cues. He was assigned additional work for homework. He continues to report fluctuation in his speech and SLP reminded him that this is to be expected depending on his level of fatigue.   Speech Therapy Frequency 2x / week   Duration 4 weeks   Treatment/Interventions Functional tasks;SLP instruction and feedback;Compensatory strategies;Patient/family education;Internal/external aids;Cognitive reorganization;Cueing hierarchy   Potential to Achieve Goals Good   Potential Considerations Co-morbidities   SLP Home Exercise Plan Pt will be independent in HEP as assigned to facilitate carryover of treatment strategies and techniques in home and community  environment   Consulted and Agree with Plan of Care Patient        Problem List Patient Active Problem List   Diagnosis Date Noted  . Obstructive sleep apnea 03/19/2015  . Cerebellar stroke ([place]) 02/28/2015  . Snoring 01/23/2015  . Degeneration of cervical intervertebral disc 01/23/2015  . Essential hypertension 12/14/2014  . Type 2 diabetes mellitus with other circulatory complications ([place]) [id number]  . HLD (hyperlipidemia) 12/14/2014  . Neck pain 12/14/2014  . Cerebral infarction, chronic   . Dizzy   . Vertigo 10/14/2014  . Multi-infarct state ([place]) 10/14/2014  . CVA (cerebral infarction) 10/14/2014  . Impingement syndrome of left shoulder 08/02/2014  . Adhesive capsulitis of left  shoulder 08/02/2014  . Abdominal pain 06/13/2014  . Rectal bleeding 03/13/2014  . Diabetes type 2, controlled ([place]) 02/18/2014  . Hyperlipidemia 02/18/2014  . Diabetic peripheral neuropathy ([place]) 03/28/2013  . Irreducible incisional hernia 11/11/2010   Thank you,  Genene Churn, [place]  Genene Churn 04/01/2015, 6:08 PM  [facility] [address] [place], [place], [address] Phone: [phone]   Fax:  [phone]   Name: LAQUINTIN ATKISON MRN: [id number] Date of Birth: Sep 06, 1960

## 2015-04-01 NOTE — Telephone Encounter (Signed)
Patient needs refills of clopidogrel (PLAVIX) 75 MG tablet and folic acid (FOLVITE) 1 MG tablet.  He is down to 1 pill on each of these so they are requesting this be filled ASAP.  [facility]

## 2015-04-01 NOTE — Telephone Encounter (Signed)
Both of these meds were last filled by a Murray Hodgkins. Are you willing to refill?
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
## 2015-04-02 MED ORDER — FOLIC ACID 1 MG PO TABS: 1.0000 mg | ORAL_TABLET | Freq: Every day | ORAL | Status: DC

## 2015-04-02 MED ORDER — CLOPIDOGREL BISULFATE 75 MG PO TABS: 75.0000 mg | ORAL_TABLET | Freq: Every day | ORAL | Status: DC

## 2015-04-02 NOTE — Addendum Note (Signed)
Addended by: Jesusita Oka on: 04/02/2015 08:33 AM   Modules accepted: Orders

## 2015-04-02 NOTE — Telephone Encounter (Signed)
Tried calling number listed below twice and phone rings twice and hangs up. Meds were sent to pharmacy.

## 2015-04-04 ENCOUNTER — Encounter (HOSPITAL_COMMUNITY): Payer: Self-pay | Admitting: Occupational Therapy

## 2015-04-04 ENCOUNTER — Ambulatory Visit (HOSPITAL_COMMUNITY): Payer: 59 | Admitting: Occupational Therapy

## 2015-04-04 ENCOUNTER — Ambulatory Visit (HOSPITAL_COMMUNITY): Payer: 59 | Attending: Orthopedic Surgery

## 2015-04-04 DIAGNOSIS — R471 Dysarthria and anarthria: Secondary | ICD-10-CM | POA: Diagnosis present

## 2015-04-04 DIAGNOSIS — R269 Unspecified abnormalities of gait and mobility: Secondary | ICD-10-CM | POA: Diagnosis present

## 2015-04-04 DIAGNOSIS — R279 Unspecified lack of coordination: Secondary | ICD-10-CM | POA: Insufficient documentation

## 2015-04-04 DIAGNOSIS — R262 Difficulty in walking, not elsewhere classified: Secondary | ICD-10-CM | POA: Diagnosis present

## 2015-04-04 DIAGNOSIS — M6281 Muscle weakness (generalized): Secondary | ICD-10-CM | POA: Diagnosis present

## 2015-04-04 DIAGNOSIS — Z9181 History of falling: Secondary | ICD-10-CM | POA: Insufficient documentation

## 2015-04-04 DIAGNOSIS — R41841 Cognitive communication deficit: Secondary | ICD-10-CM | POA: Insufficient documentation

## 2015-04-04 DIAGNOSIS — R29898 Other symptoms and signs involving the musculoskeletal system: Secondary | ICD-10-CM | POA: Insufficient documentation

## 2015-04-04 DIAGNOSIS — R2681 Unsteadiness on feet: Secondary | ICD-10-CM | POA: Insufficient documentation

## 2015-04-04 DIAGNOSIS — I69919 Unspecified symptoms and signs involving cognitive functions following unspecified cerebrovascular disease: Secondary | ICD-10-CM | POA: Insufficient documentation

## 2015-04-04 DIAGNOSIS — R278 Other lack of coordination: Secondary | ICD-10-CM

## 2015-04-04 NOTE — Therapy (Signed)
[place], [place], [address] Phone: [phone]   Fax:  [phone]  Occupational Therapy Treatment  Patient Details  Name: Travis [name] MRN: [id number] Date of Birth: [birth year] Referring Provider: Dr. [name]  Encounter Date: 04/04/2015      OT End of Session - 04/04/15 1611    Visit Number 2   Number of Visits 6   Date for OT Re-Evaluation 05/20/15  mini reassess 04/18/15   Authorization Type UMR no visit limit   OT Start Time 1517   OT Stop Time 1600   OT Time Calculation (min) 43 min   Activity Tolerance Patient tolerated treatment well   Behavior During Therapy [facility] for tasks assessed/performed      Past Medical History  Diagnosis Date  . Night sweats     every once in a while  . Hyperlipidemia   . Diabetes mellitus   . Hernia, inguinal   . Dizziness   . Chronic headaches   . Arthritis   . Neuropathy of lower extremity   . Diabetic peripheral neuropathy ([place]) 03/28/2013  . Diverticulosis   . Hypertension   . Complication of anesthesia     bleed after last shoulder-had to stay overnight  . Impingement syndrome of left shoulder 08/02/2014  . 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 Multi-infarct state ([place]) 10/14/2014  . Depression   . Anxiety     Past Surgical History  Procedure Laterality Date  . Inguinal hernia repair Bilateral   . Cervical disc surgery    . Wrist surgery Right     fusion  . Shoulder arthroscopy Right     1  . Knee arthroscopy Right   . Colon surgery  2003    1/3 removed for diverticulitis  . Colonoscopy    . Umbilical hernia repair      with other hernia repair with mesh  . Shoulder arthroscopy Left 08/02/2014    Procedure: LEFT SHOULDER SCOPE DEBRIDEMENT/ACROMIOPLASTY;  Surgeon: [name], MD;  Location: [facility];  Service: Orthopedics;  Laterality: Left;  ANESTHESIA: GENERAL, PRE/POST OP SCALENE  . Tee without cardioversion  N/A 03/03/2015    Procedure: TRANSESOPHAGEAL ECHOCARDIOGRAM (TEE);  Surgeon: [name], MD;  [facility];  Service: Cardiology;  Laterality: N/A;    There were no vitals filed for this visit.  Visit Diagnosis:  Decreased grip strength of right hand  Decreased coordination  Muscle weakness of right upper extremity      Subjective Assessment - 04/04/15 1521    Subjective  S: My wrist and arm get tremors and shake if I don't have my arm braced against the table.    Currently in Pain? Yes   Pain Score 5    Pain Location Wrist   Pain Orientation Right   Pain Descriptors / Indicators Sore   Pain Type Chronic pain            [facility] OT Assessment - 04/04/15 1521    Assessment   Diagnosis CVA   Precautions   Precautions Fall                  OT Treatments/Exercises (OP) - 04/04/15 1522    Exercises   Exercises Wrist;Hand   Wrist Exercises   Wrist Flexion AROM;15 reps   Wrist Extension AROM;15 reps   Wrist Radial Deviation AROM;15 reps   Wrist Ulnar Deviation AROM;15 reps   Additional Wrist Exercises  Hand Gripper with Large Beads 6/6 beads with gripper set to 35#   Hand Gripper with Medium Beads 13/13 beads with gripper set to 35#   Hand Gripper with Small Beads 17/17 beads with gripper set to 35#   Fine Motor Coordination   Fine Motor Coordination Small Pegboard;Large Pegboard   Large Pegboard Pt completed large pegboard pattern with pegboard set up on stand-up easel. Pt completed pattern with no difficulties and no ataxic movements throughout task.    Small Pegboard Pt completed small pegboard pattern using tweezers to place and remove pegs. Pt was able to complete in a timely manner with no difficulty using tweezers to place and remove pegs.                 OT Education - 04/04/15 1611    Education provided Yes   Education Details wrist A/ROM exercises   Person(s) Educated Patient   Methods Explanation;Demonstration;Handout    Comprehension Verbalized understanding;Returned demonstration          OT Short Term Goals - 04/04/15 1614    OT SHORT TERM GOAL #1   Title Pt will be educated on and independent in [place].    Time 6   Period Weeks   Status On-going   OT SHORT TERM GOAL #2   Title Pt will return to prior level of functioning and independence in daily tasks, using RUE as dominant.    Time 6   Period Weeks   Status On-going   OT SHORT TERM GOAL #3   Title Pt will increase RUE grip strength by 20# to increase ability to hold onto 2 liter drink bottles.    Time 6   Period Weeks   Status On-going   OT SHORT TERM GOAL #4   Title Pt will increase fine motor coordination to improve ability to brush his teeth using RUE, by completing 9 hole peg test in under 28".    Time 6   Period Weeks   Status On-going   OT SHORT TERM GOAL #5   Title Pt will increase RUE wrist and forearm strength to 4+/5 to increase ability to rake the yard using RUE as dominant.    Time 6   Period Weeks   Status On-going           OT Long Term Goals - 12/19/14 1629    OT LONG TERM GOAL #1   Title Patient will return to highest level of independence with all daily, leisure, and work tasks.    Time 8   Period Weeks   Status Achieved   OT LONG TERM GOAL #2   Title Patient will decrease pain to 2/10 or less during daily tasks.    Time 8   Period Weeks   OT LONG TERM GOAL #3   Title Patient will increase AROM of LUE to WNL to increase ability to complete overhead tasks at work.    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    Time 8   Period Weeks   Status Partially Met   OT LONG TERM GOAL #5   Title Patient will decrease fascial restrictions to trace amount.    Time 8   Period Weeks               Plan - 04/04/15 1611    Clinical  Impression Statement A: Pt completed wrist A/ROM exercises, grip  strengthening, and fine/gross motor coordination tasks this session. Pt reports shaking during ADL tasks when his arm is not braced on a firm surface, pt did not demonstrate any tremors or ataxic movements during large pegboard task. Pt reports pain in his wrist at beginning of session, reports lessened pain at end of session.    Plan P: Add weight to wrist exercises, add elbow theraband exercises, add grooved pegboard task using tweezers.         Problem List Patient Active Problem List   Diagnosis Date Noted  . Obstructive sleep apnea 03/19/2015  . Cerebellar stroke ([place]) 02/28/2015  . Snoring 01/23/2015  . Degeneration of cervical intervertebral disc 01/23/2015  . Essential hypertension 12/14/2014  . Type 2 diabetes mellitus with other circulatory complications ([place]) 01/65/8006  . HLD (hyperlipidemia) 12/14/2014  . Neck pain 12/14/2014  . Cerebral infarction, chronic   . Dizzy   . Vertigo 10/14/2014  . Multi-infarct state ([place]) 10/14/2014  . CVA (cerebral infarction) 10/14/2014  . Impingement syndrome of left shoulder 08/02/2014  . 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 Abdominal pain 06/13/2014  . Rectal bleeding 03/13/2014  . Diabetes type 2, controlled ([place]) 02/18/2014  . Hyperlipidemia 02/18/2014  . Diabetic peripheral neuropathy ([place]) 03/28/2013  . Irreducible incisional hernia 11/11/2010   Guadelupe Sabin, OTR/L  [phone]  04/04/2015, 4:14 PM  [place], [place], [address] Phone: [phone]   Fax:  [phone]  Name: Travis [name] MRN: [id number] Date of Birth: 04/25/1961

## 2015-04-04 NOTE — Patient Instructions (Signed)
AROM Exercises   1) Wrist Flexion  Start with wrist at edge of table, palm facing up. With wrist hanging slightly off table, curl wrist upward, and back down.      2) Wrist Extension  Start with wrist at edge of table, palm facing down. With wrist slightly off the edge of the table, curl wrist up and back down.      3) Radial Deviations  Start with forearm flat against a table, wrist hanging slightly off the edge, and palm facing the wall. Bending at the wrist only, and keeping palm facing the wall, bend wrist so fist is pointing towards the floor, back up to start position, and up towards the ceiling. Return to start.        *Complete exercises __15____ times each, ____2___ times per day*

## 2015-04-04 NOTE — Patient Instructions (Signed)
Arm / Leg Extension: Alternate (All-Fours)    Raise right arm and opposite leg. Do not arch neck. Repeat 10-20 times per set. Do 1-2 sets per session.  http://orth.exer.us/110   Copyright  VHI. All rights reserved.    SINGLE LIMB STANCE    Stance: single leg on floor. Raise leg. Hold 60 seconds. Repeat with other leg. Copyright  VHI. All rights reserved.   Band Walk: Side Stepping    Tie band around legs, just above knees. Step 20 feet to one side, then step back to start. Note: Small towel between band and skin eases rubbing.  http://plyo.exer.us/76   Copyright  VHI. All rights reserved.

## 2015-04-04 NOTE — Therapy (Addendum)
[place], [place], [address] Phone: [phone]   Fax:  [phone]  Physical Therapy Treatment (Re-Assessment)  Patient Details  Name: Travis [name] MRN: [id number] Date of Birth: Jul 09, 1960 Referring Provider: [name]  Encounter Date: 04/04/2015    Past Medical History  Diagnosis Date  . Night sweats     every once in a while  . Hyperlipidemia   . Diabetes mellitus   . Hernia, inguinal   . Dizziness   . Chronic headaches   . Arthritis   . Neuropathy of lower extremity   . Diabetic peripheral neuropathy ([place]) 03/28/2013  . Diverticulosis   . Hypertension   . Complication of anesthesia     bleed after last shoulder-had to stay overnight  . Impingement syndrome of left shoulder 08/02/2014  . 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 Multi-infarct state ([place]) 10/14/2014  . Depression   . Anxiety     Past Surgical History  Procedure Laterality Date  . Inguinal hernia repair Bilateral   . Cervical disc surgery    . Wrist surgery Right     fusion  . Shoulder arthroscopy Right     1  . Knee arthroscopy Right   . Colon surgery  2003    1/3 removed for diverticulitis  . Colonoscopy    . Umbilical hernia repair      with other hernia repair with mesh  . Shoulder arthroscopy Left 08/02/2014    Procedure: LEFT SHOULDER SCOPE DEBRIDEMENT/ACROMIOPLASTY;  Surgeon: [name], MD;  Location: [facility];  Service: Orthopedics;  Laterality: Left;  ANESTHESIA: GENERAL, PRE/POST OP SCALENE  . Tee without cardioversion N/A 03/03/2015    Procedure: TRANSESOPHAGEAL ECHOCARDIOGRAM (TEE);  Surgeon: [name], MD;  [facility];  Service: Cardiology;  Laterality: N/A;    There were no vitals filed for this visit.  Visit Diagnosis:  Weakness of right leg  Abnormality of gait  Risk for falls  Difficulty walking  Unsteadiness   EXERCISE TODAY:  Hip ADD and  ABD; squats; rocker board; SLS; sidestepping with RTB; quadruped hip extensions and fire hydrants   RE-ASSESSMENT:  Muscular strength 4-5/5; five times sit to stand 8.34 seconds; TUG 9.06; able to ambulate 1226f; 3 minute walk test 6347f    CLINICAL IMPRESSION STATEMENT:  Reassessment complete with the following findings:  Pt complaint with HEP and able to demonstrate/verbalize appropriate technique with all exercises.  Pt able to ambulate with no LOB with SPC and no AD, pt encouraged to begin gait with SPC.  Pt presents with improved stregth for all LE musculature.  Pt presents with improve speed with sit to stands no HHA, improved safe mechanics and increase speed with TUG test and able to increase to 1,243 feet with 6 min walk.  Improved balance with SLS noted as well.  Pt given advanced HEP and able to demostrate appropraite form and technique with new exercises.  Improved percieved functional ability noted with 20% improvements with FOTO score.  All goals met, no further need for skilled intervention.  Addendum 12/8- primary treatment PT not in agreement that patient is ready for DC to HEP just yet- spoke to rehab supervisor who allowed PT to bring patient back in for another re-assessment with possible update of goals, as formal DC note had not been completed yet. Primary treatment PT would potentially like to upgrade patient's goals.   Patient may benefit from skilled PT services moving  forward, frequency/focus to be determined by re-assessment by primary treatment PT.                             PT Short Term Goals - 04/04/15 1607    PT SHORT TERM GOAL #1   Title Pt will be independent with HEP.   Baseline 04/04/2015:  Reports compliance daily   Status Achieved   PT SHORT TERM GOAL #2   Title Increase LE strength to improve five time sit to stand to 12 seconds or less.    Baseline 04/04/2015 5 STS 10.13   Status Achieved   PT SHORT TERM GOAL #3   Title  Decrease fall risk evidenced by TUG time of 15 seconds or less.   Baseline 06/04/2014: TUG with SPC 9.06 seconds   PT SHORT TERM GOAL #4   Title Improve gait speed evidenced by 475 ft or more ambulated during 3 minute walk test.    Status Achieved           PT Long Term Goals - 04/04/15 1610    PT LONG TERM GOAL #1   Title Pt to be independent with advanced HEP.   Status On-going   PT LONG TERM GOAL #2   Title Improve RLE strength to 4/5 or greater to improve gait mechanics and functional mobility.   Status Achieved   PT LONG TERM GOAL #3   Title Improve BLE strength and power evidenced by five time sit to stand time of 10 seconds or less.   Baseline 04/04/2015 5 STS 8.34 seconds no HHA   Status Achieved   PT LONG TERM GOAL #4   Title Decrease fall risk evidenced by 13 seconds or less on TUG.    Baseline 04/04/2015: TUG 09.06 with SPC   Status Achieved   PT LONG TERM GOAL #5   Title Pt will ambulate 1,000 feet or greater during 6MWT using LRAD or no AD to demonstrate improved gait speed and to return pt to community ambulation.    Baseline 04/04/2015: 6 minute 1,243 feet with SPC   Status Achieved               Problem List Patient Active Problem List   Diagnosis Date Noted  . Obstructive sleep apnea 03/19/2015  . Cerebellar stroke ([place]) 02/28/2015  . Snoring 01/23/2015  . Degeneration of cervical intervertebral disc 01/23/2015  . Essential hypertension 12/14/2014  . Type 2 diabetes mellitus with other circulatory complications ([place]) 89/21/1941  . HLD (hyperlipidemia) 12/14/2014  . Neck pain 12/14/2014  . Cerebral infarction, chronic   . Dizzy   . Vertigo 10/14/2014  . Multi-infarct state ([place]) 10/14/2014  . CVA (cerebral infarction) 10/14/2014  . Impingement syndrome of left shoulder 08/02/2014  . 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 Abdominal pain 06/13/2014  . Rectal bleeding 03/13/2014  . Diabetes type 2, controlled ([place]) 02/18/2014  .  Hyperlipidemia 02/18/2014  . Diabetic peripheral neuropathy ([place]) 03/28/2013  . Irreducible incisional hernia 11/11/2010   Ihor Austin, LPTA; CBIS [phone]  Physical Therapy Progress Note  Dates of Reporting Period: 03/06/15 to 04/10/15  Objective Reports of Subjective Statement: see above   Objective Measurements: see above   Goal Update: to be updated upon re-assessment with PT   Plan: primary treatment PT not in agreement that patient ready for DC; patient to return to skilled PT services for re-assessment with primary treatment PT and possible update of functional goals  Reason Skilled Services are Required: potential update of functional goals, as primary treatment PT not in agreement that patient is appropriate for DC yet at this time and would potentially like to upgrade functional goals     Deniece Ree PT, DPT [place], [place], [address] Phone: [phone]   Fax:  [phone]  Name: LEELYNN WHETSEL MRN: [id number] Date of Birth: [birth year]

## 2015-04-08 ENCOUNTER — Telehealth (HOSPITAL_COMMUNITY): Payer: Self-pay | Admitting: Physical Therapy

## 2015-04-08 ENCOUNTER — Ambulatory Visit (HOSPITAL_COMMUNITY): Payer: 59 | Admitting: Speech Pathology

## 2015-04-08 ENCOUNTER — Encounter (HOSPITAL_COMMUNITY): Payer: 59 | Admitting: Occupational Therapy

## 2015-04-08 ENCOUNTER — Ambulatory Visit (HOSPITAL_COMMUNITY): Payer: 59 | Admitting: Physical Therapy

## 2015-04-08 DIAGNOSIS — R29898 Other symptoms and signs involving the musculoskeletal system: Secondary | ICD-10-CM | POA: Diagnosis not present

## 2015-04-08 DIAGNOSIS — I69919 Unspecified symptoms and signs involving cognitive functions following unspecified cerebrovascular disease: Secondary | ICD-10-CM

## 2015-04-08 DIAGNOSIS — R471 Dysarthria and anarthria: Secondary | ICD-10-CM

## 2015-04-08 NOTE — Telephone Encounter (Signed)
Returned patient's wife's phone call regarding patient's recent DC by PTA. Left message stating that PT is going to speak to supervisor regarding situation in AM, however PT would like to personally perform re-assessment before finalizing discharge. Advised that PT will call back tomorrow regarding plan moving forward. Left clinic number in case of any questions.  Deniece Ree PT, DPT [phone]

## 2015-04-08 NOTE — Therapy (Signed)
[place], [place], [address] Phone: [phone]   Fax:  [phone]  Speech Language Pathology Treatment  Patient Details  Name: Travis [name] MRN: [id number] Date of Birth: Aug 11, 1960 Referring Provider: Dr. Sallee [name]  Encounter Date: 04/08/2015      End of Session - 04/08/15 1653    Visit Number 7   Number of Visits 8   Date for SLP Re-Evaluation 04/17/15   Authorization Type UMR   SLP Start Time 1615   SLP Stop Time  1700   SLP Time Calculation (min) 45 min   Activity Tolerance Patient tolerated treatment well      Past Medical History  Diagnosis Date  . Night sweats     every once in a while  . Hyperlipidemia   . Diabetes mellitus   . Hernia, inguinal   . Dizziness   . Chronic headaches   . Arthritis   . Neuropathy of lower extremity   . Diabetic peripheral neuropathy ([place]) 03/28/2013  . Diverticulosis   . Hypertension   . Complication of anesthesia     bleed after last shoulder-had to stay overnight  . Impingement syndrome of left shoulder 08/02/2014  . 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 Multi-infarct state ([place]) 10/14/2014  . Depression   . Anxiety     Past Surgical History  Procedure Laterality Date  . Inguinal hernia repair Bilateral   . Cervical disc surgery    . Wrist surgery Right     fusion  . Shoulder arthroscopy Right     1  . Knee arthroscopy Right   . Colon surgery  2003    1/3 removed for diverticulitis  . Colonoscopy    . Umbilical hernia repair      with other hernia repair with mesh  . Shoulder arthroscopy Left 08/02/2014    Procedure: LEFT SHOULDER SCOPE DEBRIDEMENT/ACROMIOPLASTY;  Surgeon: Travis Bond, MD;  Location: [facility];  Service: Orthopedics;  Laterality: Left;  ANESTHESIA: GENERAL, PRE/POST OP SCALENE  . Tee without cardioversion N/A 03/03/2015    Procedure: TRANSESOPHAGEAL ECHOCARDIOGRAM (TEE);  Surgeon: Travis Commons, MD;  [facility];  Service: Cardiology;  Laterality: N/A;    There were no vitals filed for this visit.  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 No/denies               ADULT SLP TREATMENT - 04/08/15 1652    General Information   Behavior/Cognition Alert;Cooperative;Pleasant mood   Patient Positioning Upright in chair   Oral care provided N/A   HPI 54 yo male with past medical history of CVA who presents for cognitive linguistic therapy.   Treatment Provided   Treatment provided Cognitive-Linquistic   Pain Assessment   Pain Assessment No/denies pain   Cognitive-Linquistic Treatment   Treatment focused on Cognition;Patient/family/caregiver education   Skilled Treatment flexibility in thinking, thought organization, problem solving, planning   Assessment / Recommendations / Plan   Plan Continue with current plan of care   Progression Toward Goals   Progression toward goals Progressing toward goals            SLP Short Term Goals - 04/08/15 1654    SLP SHORT TERM GOAL #1  Title Pt will implement memory strategies in functional tasks for 90% acc with min assist   Time 6   Period Weeks   Status On-going   SLP SHORT TERM GOAL #2   Title Pt will complete mod level thought organization and deductive reasoning tasks with 90% acc with use of strategies and min assist.    Time 6   Period Weeks   Status On-going   SLP SHORT TERM GOAL #3   Title Pt will identify appropriate strategies to use in hypothetical everyday problem solving scenarios with min assist.    Time 6   Period Weeks   Status On-going   SLP SHORT TERM GOAL #4   Title Pt will implement speech intelligibility strategies with 95% acc during conversations to increase naturalness and  intelligibility of speech.   Baseline 75% acc   Time 8   Period Weeks   Status New          SLP Long Term Goals - 04/08/15 1654    SLP LONG TERM GOAL #1   Title Pt will communicate complex thoughts and feelings to [facility] with use of strategies. Pt will also be independent with use of memory and thought organization strategies.   Baseline min impairment   Time 3   Period Months   Status On-going          Plan - 04/08/15 1654    Clinical Impression Statement Travis [name] completed homework as assigned, but reported that he had difficulty with one of the tasks. He completed with 90% acc and was able to identify errors with min cues from SLP. He wondered aloud about his Lyrica dosage and whether he should take it after he comes for therapy because it can make him feel "jumpy" and "sped up". He currently takes it 3x per day and he was encouraged to discuss with his doctor. He also wonders if it impacts his speech because he feels best when he first awakens in the morning. In session, he completed a moderate-level planning and organization task in 20 minutes with 90% acc. SLP provided min cues for strategies (write down what you know first, cross out what you no longer need). He was given an additional page for homework. Continue POC.    Speech Therapy Frequency 2x / week   Duration 4 weeks   Treatment/Interventions Functional tasks;SLP instruction and feedback;Compensatory strategies;Patient/family education;Internal/external aids;Cognitive reorganization;Cueing hierarchy   Potential to Achieve Goals Good   Potential Considerations Co-morbidities   SLP Home Exercise Plan Pt will be independent in HEP as assigned to facilitate carryover of treatment strategies and techniques in home and community environment   Consulted and Agree with Plan of Care Patient        Problem List Patient Active Problem List   Diagnosis Date Noted  . Obstructive sleep apnea 03/19/2015  . Cerebellar stroke  ([place]) 02/28/2015  . Snoring 01/23/2015  . Degeneration of cervical intervertebral disc 01/23/2015  . Essential hypertension 12/14/2014  . Type 2 diabetes mellitus with other circulatory complications ([place]) [id number]  . HLD (hyperlipidemia) 12/14/2014  . Neck pain 12/14/2014  . Cerebral infarction, chronic   . Dizzy   . Vertigo 10/14/2014  . Multi-infarct state ([place]) 10/14/2014  . CVA (cerebral infarction) 10/14/2014  . Impingement syndrome of left shoulder 08/02/2014  . 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 Abdominal pain 06/13/2014  . Rectal bleeding 03/13/2014  . Diabetes type 2, controlled ([place]) 02/18/2014  . Hyperlipidemia 02/18/2014  . Diabetic peripheral  neuropathy ([place]) 03/28/2013  . Irreducible incisional hernia 11/11/2010   Thank you,  Genene Churn, [place]  [facility] 04/08/2015, 4:55 PM  [facility] [address] [place], [place], [address] Phone: [phone]   Fax:  [phone]   Name: Travis [name] MRN: [id number] Date of Birth: 06-15-1960

## 2015-04-09 ENCOUNTER — Telehealth (HOSPITAL_COMMUNITY): Payer: Self-pay | Admitting: Physical Therapy

## 2015-04-09 NOTE — Telephone Encounter (Signed)
Called and left message for patient and his wife reporting that PT would like to see patient for one-on-one re-assessment with PT specifically, and would like to schedule. Left clinic number for questions.   Deniece Ree PT, DPT [phone]

## 2015-04-11 ENCOUNTER — Ambulatory Visit (HOSPITAL_COMMUNITY): Payer: 59

## 2015-04-11 ENCOUNTER — Ambulatory Visit (HOSPITAL_COMMUNITY): Payer: 59 | Admitting: Occupational Therapy

## 2015-04-11 ENCOUNTER — Encounter (HOSPITAL_COMMUNITY): Payer: Self-pay | Admitting: Occupational Therapy

## 2015-04-11 DIAGNOSIS — R29898 Other symptoms and signs involving the musculoskeletal system: Secondary | ICD-10-CM | POA: Diagnosis not present

## 2015-04-11 DIAGNOSIS — R279 Unspecified lack of coordination: Secondary | ICD-10-CM

## 2015-04-11 DIAGNOSIS — R278 Other lack of coordination: Secondary | ICD-10-CM

## 2015-04-11 NOTE — Therapy (Signed)
[name], [place], [address] Phone: [phone]   Fax:  [phone]  Occupational Therapy Treatment  Patient Details  Name: Travis [name] MRN: [id number] Date of Birth: June 13, 1960 Referring Provider: Dr. [name]  Encounter Date: 04/11/2015      OT End of Session - 04/11/15 1614    Visit Number 3   Number of Visits 6   Date for OT Re-Evaluation 05/20/15  mini reassess 04/18/15   Authorization Type UMR no visit limit   OT Start Time 1518   OT Stop Time 1604   OT Time Calculation (min) 46 min   Activity Tolerance Patient tolerated treatment well   Behavior During Therapy [facility] for tasks assessed/performed      Past Medical History  Diagnosis Date  . Night sweats     every once in a while  . Hyperlipidemia   . Diabetes mellitus   . Hernia, inguinal   . Dizziness   . Chronic headaches   . Arthritis   . Neuropathy of lower extremity   . Diabetic peripheral neuropathy ([place]) 03/28/2013  . Diverticulosis   . Hypertension   . Complication of anesthesia     bleed after last shoulder-had to stay overnight  . Impingement syndrome of left shoulder 08/02/2014  . 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 Multi-infarct state ([place]) 10/14/2014  . Depression   . Anxiety     Past Surgical History  Procedure Laterality Date  . Inguinal hernia repair Bilateral   . Cervical disc surgery    . Wrist surgery Right     fusion  . Shoulder arthroscopy Right     1  . Knee arthroscopy Right   . Colon surgery  2003    1/3 removed for diverticulitis  . Colonoscopy    . Umbilical hernia repair      with other hernia repair with mesh  . Shoulder arthroscopy Left 08/02/2014    Procedure: LEFT SHOULDER SCOPE DEBRIDEMENT/ACROMIOPLASTY;  Surgeon: [name], MD;  Location: [facility];  Service: Orthopedics;  Laterality: Left;  ANESTHESIA: GENERAL, PRE/POST OP SCALENE  . Tee without cardioversion  N/A 03/03/2015    Procedure: TRANSESOPHAGEAL ECHOCARDIOGRAM (TEE);  Surgeon: [name], MD;  [facility];  Service: Cardiology;  Laterality: N/A;    There were no vitals filed for this visit.  Visit Diagnosis:  Decreased grip strength of right hand  Decreased coordination      Subjective Assessment - 04/11/15 1521    Subjective  S: My wrist is killing me today, ever since I got up.    Currently in Pain? Yes   Pain Score 9    Pain Location Wrist   Pain Orientation Right   Pain Descriptors / Indicators Aching;Sore   Pain Type Chronic pain            [facility] OT Assessment - 04/11/15 1521    Assessment   Diagnosis CVA   Precautions   Precautions Fall                  OT Treatments/Exercises (OP) - 04/11/15 1523    Exercises   Exercises Wrist;Hand   Additional Wrist Exercises   Theraputty Flatten;Roll;Grip;Pinch   Theraputty - Flatten green   Theraputty - Roll green   Theraputty - Grip green-supinated & pronated   Theraputty - Pinch green-lateral and 3 point pinch   Hand Gripper with Large Beads 6/6 beads with gripper  set to 38#   Hand Gripper with Medium Beads 13/13 beads with gripper set to 38#   Hand Gripper with Small Beads 17/17 beads with gripper set to 38#   Fine Motor Coordination   Fine Motor Coordination Grooved pegs   Grooved pegs Pt completed grooved pegboard using tweezers with RUE. Pt able to complete in appropriate time limit. Minimal shaking/unsteadiness towards end of task. Pt was able to maneuver wrist in various directions to place pegs without complaints of pain.                   OT Short Term Goals - 04/04/15 1614    OT SHORT TERM GOAL #1   Title Pt will be educated on and independent in HEP.    Time 6   Period Weeks   Status On-going   OT SHORT TERM GOAL #2   Title Pt will return to prior level of functioning and independence in daily tasks, using RUE as dominant.    Time 6   Period Weeks   Status  On-going   OT SHORT TERM GOAL #3   Title Pt will increase RUE grip strength by 20# to increase ability to hold onto 2 liter drink bottles.    Time 6   Period Weeks   Status On-going   OT SHORT TERM GOAL #4   Title Pt will increase fine motor coordination to improve ability to brush his teeth using RUE, by completing 9 hole peg test in under 28".    Time 6   Period Weeks   Status On-going   OT SHORT TERM GOAL #5   Title Pt will increase RUE wrist and forearm strength to 4+/5 to increase ability to rake the yard using RUE as dominant.    Time 6   Period Weeks   Status On-going           OT Long Term Goals - 12/19/14 1629    OT LONG TERM GOAL #1   Title Patient will return to highest level of independence with all daily, leisure, and work tasks.    Time 8   Period Weeks   Status Achieved   OT LONG TERM GOAL #2   Title Patient will decrease pain to 2/10 or less during daily tasks.    Time 8   Period Weeks   OT LONG TERM GOAL #3   Title Patient will increase AROM of LUE to WNL to increase ability to complete overhead tasks at work.    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    Time 8   Period Weeks   Status Partially Met   OT LONG TERM GOAL #5   Title Patient will decrease fascial restrictions to trace amount.    Time 8   Period Weeks               Plan - 04/11/15 1614    Clinical Impression Statement A: Pt reports severe pain in wrist at beginning of session. However, pt able to demonstrate raking leaves, complete hand gripper exercises, complete theraputty without complaints or signs of pain. OT also observed pt complete a chair push up, holding himself off the chair with BUE supported. When questioned pt reports that it did not hurt his wrist.    Plan P: Resume wrist exercises with weight, add elbow  theraband exercises.         Problem List Patient  Active Problem List   Diagnosis Date Noted  . Obstructive sleep apnea 03/19/2015  . Cerebellar stroke ([place]) 02/28/2015  . Snoring 01/23/2015  . Degeneration of cervical intervertebral disc 01/23/2015  . Essential hypertension 12/14/2014  . Type 2 diabetes mellitus with other circulatory complications ([place]) 12/87/8676  . HLD (hyperlipidemia) 12/14/2014  . Neck pain 12/14/2014  . Cerebral infarction, chronic   . Dizzy   . Vertigo 10/14/2014  . Multi-infarct state ([place]) 10/14/2014  . CVA (cerebral infarction) 10/14/2014  . Impingement syndrome of left shoulder 08/02/2014  . 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 Abdominal pain 06/13/2014  . Rectal bleeding 03/13/2014  . Diabetes type 2, controlled ([place]) 02/18/2014  . Hyperlipidemia 02/18/2014  . Diabetic peripheral neuropathy ([place]) 03/28/2013  . Irreducible incisional hernia 11/11/2010    Guadelupe Sabin, OTR/L  [phone]  04/11/2015, 4:17 PM  [facility] [address] [place], [place], [address] Phone: [phone]   Fax:  [phone]  Name: Travis [name] MRN: [id number] Date of Birth: 01-Oct-1960

## 2015-04-15 ENCOUNTER — Ambulatory Visit (HOSPITAL_COMMUNITY): Payer: 59 | Admitting: Occupational Therapy

## 2015-04-15 ENCOUNTER — Encounter (HOSPITAL_COMMUNITY): Payer: 59 | Admitting: Occupational Therapy

## 2015-04-15 ENCOUNTER — Ambulatory Visit (HOSPITAL_COMMUNITY): Payer: 59 | Admitting: Physical Therapy

## 2015-04-15 ENCOUNTER — Ambulatory Visit (HOSPITAL_COMMUNITY): Payer: 59 | Admitting: Speech Pathology

## 2015-04-15 ENCOUNTER — Encounter (HOSPITAL_COMMUNITY): Payer: Self-pay | Admitting: Occupational Therapy

## 2015-04-15 DIAGNOSIS — R29898 Other symptoms and signs involving the musculoskeletal system: Secondary | ICD-10-CM

## 2015-04-15 DIAGNOSIS — R41841 Cognitive communication deficit: Secondary | ICD-10-CM

## 2015-04-15 DIAGNOSIS — R278 Other lack of coordination: Secondary | ICD-10-CM

## 2015-04-15 DIAGNOSIS — R279 Unspecified lack of coordination: Secondary | ICD-10-CM

## 2015-04-15 DIAGNOSIS — R471 Dysarthria and anarthria: Secondary | ICD-10-CM

## 2015-04-15 NOTE — Therapy (Signed)
[place], [place], [address] Phone: [phone]   Fax:  [phone]  Speech Language Pathology Treatment  Patient Details  Name: Travis [name] MRN: [id number] Date of Birth: 1960-08-13 Referring Provider: Dr. [name]  Encounter Date: 04/15/2015      End of Session - 04/15/15 1629    Visit Number 8   Number of Visits 8   Date for SLP Re-Evaluation 04/17/15   Authorization Type UMR   SLP Start Time 1606   Activity Tolerance Patient tolerated treatment well      Past Medical History  Diagnosis Date  . Night sweats     every once in a while  . Hyperlipidemia   . Diabetes mellitus   . Hernia, inguinal   . Dizziness   . Chronic headaches   . Arthritis   . Neuropathy of lower extremity   . Diabetic peripheral neuropathy ([place]) 03/28/2013  . Diverticulosis   . Hypertension   . Complication of anesthesia     bleed after last shoulder-had to stay overnight  . Impingement syndrome of left shoulder 08/02/2014  . 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 Multi-infarct state ([place]) 10/14/2014  . Depression   . Anxiety     Past Surgical History  Procedure Laterality Date  . Inguinal hernia repair Bilateral   . Cervical disc surgery    . Wrist surgery Right     fusion  . Shoulder arthroscopy Right     1  . Knee arthroscopy Right   . Colon surgery  2003    1/3 removed for diverticulitis  . Colonoscopy    . Umbilical hernia repair      with other hernia repair with mesh  . Shoulder arthroscopy Left 08/02/2014    Procedure: LEFT SHOULDER SCOPE DEBRIDEMENT/ACROMIOPLASTY;  Surgeon: [name], MD;  Location: [facility];  Service: Orthopedics;  Laterality: Left;  ANESTHESIA: GENERAL, PRE/POST OP SCALENE  . Tee without cardioversion N/A 03/03/2015    Procedure: TRANSESOPHAGEAL ECHOCARDIOGRAM (TEE);  Surgeon: [name], MD;  [facility];  Service: Cardiology;   Laterality: N/A;    There were no vitals filed for this visit.  Visit Diagnosis: Cognitive communication deficit  Dysarthria      Subjective Assessment - 04/15/15 1618    Subjective "I've been better and I've been worse."   Currently in Pain? No/denies               ADULT SLP TREATMENT - 04/15/15 1625    General Information   Behavior/Cognition Alert;Cooperative;Pleasant mood   Patient Positioning Upright in bed   Oral care provided N/A   HPI 54 yo male with past medical history of CVA who presents for cognitive linguistic therapy.   Treatment Provided   Treatment provided Cognitive-Linquistic   Pain Assessment   Pain Assessment No/denies pain   Cognitive-Linquistic Treatment   Treatment focused on Cognition;Patient/family/caregiver education   Skilled Treatment flexibility in thinking, thought organization, problem solving, planning; oral reading of paragraph length material and tongue twisters   Assessment / Recommendations / Plan   Plan Continue with current plan of care   Progression Toward Goals   Progression toward goals Progressing toward goals            SLP Short Term Goals - 04/15/15 1630    SLP SHORT TERM GOAL #1   Title Pt will implement memory strategies in functional tasks for 90% acc with min assist  Time 6   Period Weeks   Status Partially Met   SLP SHORT TERM GOAL #2   Title Pt will complete mod level thought organization and deductive reasoning tasks with 90% acc with use of strategies and min assist.    Time 6   Period Weeks   Status Partially Met   SLP SHORT TERM GOAL #3   Title Pt will identify appropriate strategies to use in hypothetical everyday problem solving scenarios with min assist.    Time 6   Period Weeks   Status Achieved   SLP SHORT TERM GOAL #4   Title Pt will implement speech intelligibility strategies with 95% acc during conversations to increase naturalness and intelligibility of speech.   Baseline 75% acc    Time 8   Period Weeks   Status Partially Met          SLP Long Term Goals - 04/15/15 1631    SLP LONG TERM GOAL #1   Title Pt will communicate complex thoughts and feelings to [facility] with use of strategies. Pt will also be independent with use of memory and thought organization strategies.   Baseline min impairment   Time 3   Period Months   Status On-going          Plan - 04/15/15 1630    Clinical Impression Statement Mr. Gallardo continues to report fluctuation in his speech, balance, handwriting, and thinking skills s/p recent stroke. Last session, he wondered if it is had to do with his pain medication. He has not altered it, but plans to speak with his doctor about this. Speech was mildly dysarthric today and SLP emphasized "accuracy over speed" during conversation and oral reading of "tongue twisters". Pt c/o excess saliva, but also "dry mouth". He was encouraged to swallow more frequently and sip on water (he tends to drink Coke Zero due to poor water quality at home per pt report). Pt completed moderate level planning/thought organization task in 25 minutes (lengthy) and needed min cues to complete. SLP provided concrete word list for him to manipulate instead of writing/re-writing which seemed to help. SLP reinforced benefit of breaking large (seemingly insurmountable) tasks down into manageable parts. Pt has made excellent progress toward all goals, but continues to need a little more time/treatment sessions to meet goals. For this reason, I am recommended an additional 4 treatment sessions to be used over the next 2-4 weeks prior to discharge. Pt in agreement with plan of care.    Speech Therapy Frequency 2x / week   Duration 4 weeks   Treatment/Interventions Functional tasks;SLP instruction and feedback;Compensatory strategies;Patient/family education;Internal/external aids;Cognitive reorganization;Cueing hierarchy   Potential to Achieve Goals Good   Potential Considerations  Co-morbidities   SLP Home Exercise Plan Pt will be independent in HEP as assigned to facilitate carryover of treatment strategies and techniques in home and community environment   Consulted and Agree with Plan of Care Patient        Problem List Patient Active Problem List   Diagnosis Date Noted  . Obstructive sleep apnea 03/19/2015  . Cerebellar stroke ([place]) 02/28/2015  . Snoring 01/23/2015  . Degeneration of cervical intervertebral disc 01/23/2015  . Essential hypertension 12/14/2014  . Type 2 diabetes mellitus with other circulatory complications ([place]) 38/45/3646  . HLD (hyperlipidemia) 12/14/2014  . Neck pain 12/14/2014  . Cerebral infarction, chronic   . Dizzy   . Vertigo 10/14/2014  . Multi-infarct state ([place]) 10/14/2014  . CVA (cerebral infarction) 10/14/2014  . Impingement  syndrome of left shoulder 08/02/2014  . 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 Abdominal pain 06/13/2014  . Rectal bleeding 03/13/2014  . Diabetes type 2, controlled ([place]) 02/18/2014  . Hyperlipidemia 02/18/2014  . Diabetic peripheral neuropathy ([place]) 03/28/2013  . Irreducible incisional hernia 11/11/2010   Speech Therapy Progress Note  Dates of Reporting Period: Evaluation through 04/15/2015  Objective Measurements: See above  Goal Update: Excellent progress toward all goals; continue with 3 of 4 goals stated above  Plan: Continue for 4 more treatment sessions to be used over the next 2-4 weeks  Reason Skilled Services are Required: Pt has made excellent progress toward goals, but would continue to benefit from additional sessions to maximize independence with cognitive linguistic skills  Thank you,  Genene Churn, [place]  [place] 04/15/2015, 4:32 PM  [place], [place], [address] Phone: [phone]   Fax:  [phone]   Name: MARKIES MOWATT MRN: [id number] Date of Birth:  [birth year]

## 2015-04-15 NOTE — Patient Instructions (Signed)
Strengthening Exercises  1) WRIST EXTENSION CURLS - TABLE  Hold a small free weight, rest your forearm on a table and bend your wrist up and down with your palm face down as shown.      2) WRIST FLEXION CURLS - TABLE  Hold a small free weight, rest your forearm on a table and bend your wrist up and down with your palm face up as shown.     3) FREE WEIGHT RADIAL/ULNAR DEVIATION - TABLE  Hold a small free weight, rest your forearm on a table and bend your wrist up and down with your palm facing towards the side as shown.     4) Pronation  Forearm supported on table with wrist in neutral position. Using a weight, roll wrist so that palm faces downward. Hold for 2 seconds and return to starting position.     5) Supination  Forearm supported on table with wrist in neutral position. Using a weight, roll wrist so that palm is now facing upward. Hold for 2 seconds and return to starting position.      *Complete exercises using _1-2___ pound weight, __10__times each, __2__times per day*

## 2015-04-15 NOTE — Therapy (Signed)
[place], [place], [address] Phone: [phone]   Fax:  [phone]  Occupational Therapy Reassessment, Treatment, & Discharge Summary  Patient Details  Name: ROTH RESS MRN: [id number] Date of Birth: 01/03/1961 Referring Provider: Dr. [name]  Encounter Date: 04/15/2015      OT End of Session - 04/15/15 1612    Visit Number 4   Number of Visits 6   Date for OT Re-Evaluation 05/20/15  mini reassess 04/18/15   Authorization Type UMR no visit limit   OT Start Time 1520   OT Stop Time 1603   OT Time Calculation (min) 43 min   Activity Tolerance Patient tolerated treatment well   Behavior During Therapy [facility] for tasks assessed/performed      Past Medical History  Diagnosis Date  . Night sweats     every once in a while  . Hyperlipidemia   . Diabetes mellitus   . Hernia, inguinal   . Dizziness   . Chronic headaches   . Arthritis   . Neuropathy of lower extremity   . Diabetic peripheral neuropathy ([place]) 03/28/2013  . Diverticulosis   . Hypertension   . Complication of anesthesia     bleed after last shoulder-had to stay overnight  . Impingement syndrome of left shoulder 08/02/2014  . 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 Multi-infarct state ([place]) 10/14/2014  . Depression   . Anxiety     Past Surgical History  Procedure Laterality Date  . Inguinal hernia repair Bilateral   . Cervical disc surgery    . Wrist surgery Right     fusion  . Shoulder arthroscopy Right     1  . Knee arthroscopy Right   . Colon surgery  2003    1/3 removed for diverticulitis  . Colonoscopy    . Umbilical hernia repair      with other hernia repair with mesh  . Shoulder arthroscopy Left 08/02/2014    Procedure: LEFT SHOULDER SCOPE DEBRIDEMENT/ACROMIOPLASTY;  Surgeon: [name], MD;  Location: [facility];  Service: Orthopedics;  Laterality: Left;  ANESTHESIA: GENERAL, PRE/POST OP  SCALENE  . Tee without cardioversion N/A 03/03/2015    Procedure: TRANSESOPHAGEAL ECHOCARDIOGRAM (TEE);  Surgeon: [name], MD;  [facility];  Service: Cardiology;  Laterality: N/A;    There were no vitals filed for this visit.  Visit Diagnosis:  Decreased grip strength of right hand  Decreased coordination      Subjective Assessment - 04/15/15 1524    Subjective  S: My wrist is feeling better today.    Currently in Pain? Yes   Pain Score 5    Pain Location Wrist   Pain Orientation Right   Pain Descriptors / Indicators Aching   Pain Type Chronic pain           [facility] OT Assessment - 04/15/15 1522    Assessment   Diagnosis CVA   Precautions   Precautions Fall   Coordination   9 Hole Peg Test Right;Left   Right 9 Hole Peg Test 26.25"  previous 33.77"   Left 9 Hole Peg Test 22.93"  previous 27/19"   Strength   Overall Strength Comments Assessed seated, ER/IR abducted    Strength Assessment Site Hand;Wrist;Forearm   Right/Left Elbow Right   Right Elbow Flexion 4/5  same as previous   Right Elbow Extension 4+/5  previous 4/5   Right/Left Forearm Right   Right  Forearm Pronation 4/5  previous 4-/5   Right Forearm Supination 4/5  previous 4-/5   Right/Left Wrist Right   Right Wrist Flexion 4+/5  previous 4-/5   Right Wrist Extension 4+/5  previous 4-/5   Right/Left hand Right;Left   Right Hand Grip (lbs) 89  previous 61   Right Hand Lateral Pinch 24 lbs  previous 17   Right Hand 3 Point Pinch 21 lbs  previous 18   Left Hand Grip (lbs) 100  previous 89   Left Hand Lateral Pinch 24 lbs  previous 18   Left Hand 3 Point Pinch 21 lbs  previous 18                  OT Treatments/Exercises (OP) - 04/15/15 1553    ADLs   LB Dressing Pt reports inability to tie shoes, when asked to demonstrate pt able to tie shoes at table level and on floor with min difficulty.    Exercises   Exercises Wrist;Hand   Fine Motor Coordination    Fine Motor Coordination Small Pegboard   Small Pegboard Pt completed small pegboard pattern using tweezers to place pegs with right hand. Pt was timed and told to go as quickly as possible, pt completed in '7\' 27"' .                OT Education - 04/15/15 1611    Education provided Yes   Education Details wrist strengthening exercises; educated pt on importance of practicing difficult tasks on a regular basis (brushing teeth, shaving, brushing hair, tying shoes)   Person(s) Educated Patient   Methods Explanation;Demonstration;Handout   Comprehension Verbalized understanding;Returned demonstration          OT Short Term Goals - 04/15/15 1532    OT SHORT TERM GOAL #1   Title Pt will be educated on and independent in [place].    Time 6   Period Weeks   Status Achieved   OT SHORT TERM GOAL #2   Title Pt will return to prior level of functioning and independence in daily tasks, using RUE as dominant.    Time 6   Period Weeks   Status Partially Met   OT SHORT TERM GOAL #3   Title Pt will increase RUE grip strength by 20# to increase ability to hold onto 2 liter drink bottles.    Time 6   Period Weeks   Status Achieved   OT SHORT TERM GOAL #4   Title Pt will increase fine motor coordination to improve ability to brush his teeth using RUE, by completing 9 hole peg test in under 28".    Time 6   Period Weeks   Status Achieved   OT SHORT TERM GOAL #5   Title Pt will increase RUE wrist and forearm strength to 4+/5 to increase ability to rake the yard using RUE as dominant.    Time 6   Period Weeks   Status Achieved  Plan - 04/15/15 1612    Clinical Impression Statement A: Mini-reassessment completed this session, pt has met 4/5 goals and has partially met 1/5 goals. Pt continues to report ataxic movements and "shaking" with tasks such as  shaving, brushing teeth, etc, however this has not been demonstrated during OT sessions. Pt has increased hand, wrist, and forearm strength and has improved fine motor coordination. Educated pt on importance of practicing using RUE as dominant and repetitive practice of difficult tasks. Provided pt with wrist strengthening HEP to continue working on strengthening exercises at home.    Plan P: Discharge pt.         Problem List Patient Active Problem List   Diagnosis Date Noted  . Obstructive sleep apnea 03/19/2015  . Cerebellar stroke ([place]) 02/28/2015  . Snoring 01/23/2015  . Degeneration of cervical intervertebral disc 01/23/2015  . Essential hypertension 12/14/2014  . Type 2 diabetes mellitus with other circulatory complications ([place]) 54/62/7035  . HLD (hyperlipidemia) 12/14/2014  . Neck pain 12/14/2014  . Cerebral infarction, chronic   . Dizzy   . Vertigo 10/14/2014  . Multi-infarct state ([place]) 10/14/2014  . CVA (cerebral infarction) 10/14/2014  . Impingement syndrome of left shoulder 08/02/2014  . 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 Abdominal pain 06/13/2014  . Rectal bleeding 03/13/2014  . Diabetes type 2, controlled ([place]) 02/18/2014  . Hyperlipidemia 02/18/2014  . Diabetic peripheral neuropathy ([place]) 03/28/2013  . Irreducible incisional hernia 11/11/2010    Guadelupe Sabin, OTR/L  [phone]  04/15/2015, 4:17 PM  [facility] [address] [place], [place], [address] Phone: [phone]   Fax:  [phone]  Name: ELSWORTH LEDIN MRN: [id number] Date of Birth: [birth year]    OCCUPATIONAL THERAPY DISCHARGE SUMMARY  Visits from Start of Care: 4  Current functional level related to goals / functional outcomes: See above. Pt reports continued difficulty with "shaking" or ataxic movements during ADL tasks such as shaving, brushing teeth, etc; however this has not been demonstrated during OT session. Pt is able  to complete all tasks provided by OT in average time frame.    Remaining deficits: Pt right grip strength continues to be slightly weaker than left, although pt is right dominant.    Education / Equipment: Provided pt with wrist strengthening exercises. Educated pt on importance of practicing ADL tasks that are difficult at home. Suggested taking blades out of plastic razor to practice shaving motion, also encouraged pt to begin using RUE as dominant.  Plan: Patient agrees to discharge.  Patient goals were met. Patient is being discharged due to meeting the stated rehab goals.  ?????

## 2015-04-17 ENCOUNTER — Encounter: Payer: Self-pay | Admitting: Neurology

## 2015-04-17 ENCOUNTER — Ambulatory Visit (INDEPENDENT_AMBULATORY_CARE_PROVIDER_SITE_OTHER): Payer: 59 | Admitting: Neurology

## 2015-04-17 VITALS — BP 120/71 | HR 95 | Ht 73.0 in | Wt 183.4 lb

## 2015-04-17 DIAGNOSIS — I639 Cerebral infarction, unspecified: Secondary | ICD-10-CM | POA: Diagnosis not present

## 2015-04-17 DIAGNOSIS — I10 Essential (primary) hypertension: Secondary | ICD-10-CM

## 2015-04-17 DIAGNOSIS — E785 Hyperlipidemia, unspecified: Secondary | ICD-10-CM | POA: Diagnosis not present

## 2015-04-17 DIAGNOSIS — E1159 Type 2 diabetes mellitus with other circulatory complications: Secondary | ICD-10-CM

## 2015-04-17 DIAGNOSIS — I6340 Cerebral infarction due to embolism of unspecified cerebral artery: Secondary | ICD-10-CM

## 2015-04-17 DIAGNOSIS — G4733 Obstructive sleep apnea (adult) (pediatric): Secondary | ICD-10-CM | POA: Diagnosis not present

## 2015-04-17 DIAGNOSIS — E1142 Type 2 diabetes mellitus with diabetic polyneuropathy: Secondary | ICD-10-CM

## 2015-04-17 NOTE — Patient Instructions (Addendum)
-   continue ASA and plavix and lipitor for stroke prevention for now - will do TCD emboli detection - will do ultrasound for lower extremities.  - will refer to have long term cardiac monitoring - introduce research study to you for consideration - Follow up with your primary care physician for stroke risk factor modification. Recommend maintain blood pressure goal <130/80, diabetes with hemoglobin A1c goal below 6.5% and lipids with LDL cholesterol goal below 70 mg/dL.  - check BP and glucose at home - continue PT/OT and home exercise. - follow up in 3 months.
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
## 2015-04-18 ENCOUNTER — Ambulatory Visit (HOSPITAL_COMMUNITY): Payer: 59

## 2015-04-18 ENCOUNTER — Encounter (HOSPITAL_COMMUNITY): Payer: 59 | Admitting: Occupational Therapy

## 2015-04-18 DIAGNOSIS — I6340 Cerebral infarction due to embolism of unspecified cerebral artery: Secondary | ICD-10-CM | POA: Insufficient documentation

## 2015-04-18 DIAGNOSIS — G4733 Obstructive sleep apnea (adult) (pediatric): Secondary | ICD-10-CM | POA: Insufficient documentation

## 2015-04-18 NOTE — Addendum Note (Signed)
Addended by: Rosalin Hawking on: 04/18/2015 04:14 PM   Modules accepted: Orders

## 2015-04-21 ENCOUNTER — Telehealth: Payer: Self-pay | Admitting: Family Medicine

## 2015-04-21 NOTE — Telephone Encounter (Signed)
Pt is requesting a prescription for a hurrycane for [facility].

## 2015-04-21 NOTE — Telephone Encounter (Signed)
Please give him a prescription for this, fax it in if requested, diagnosis ataxia stroke

## 2015-04-21 NOTE — Telephone Encounter (Signed)
Rx faxed to [facility]. Patient notified.

## 2015-04-22 ENCOUNTER — Telehealth: Payer: Self-pay | Admitting: Family Medicine

## 2015-04-22 ENCOUNTER — Encounter (HOSPITAL_COMMUNITY): Payer: 59 | Admitting: Speech Pathology

## 2015-04-22 ENCOUNTER — Ambulatory Visit (HOSPITAL_COMMUNITY): Payer: 59 | Admitting: Physical Therapy

## 2015-04-22 ENCOUNTER — Ambulatory Visit (HOSPITAL_COMMUNITY): Payer: 59 | Admitting: Speech Pathology

## 2015-04-22 NOTE — Telephone Encounter (Signed)
Travis [name] w/[facility] (ph# [phone]) left a message on my voicemail  Order was received but [facility] does not carry the "hurrycane"  She states that if we would like to order a [employer] they could provide patient with one  Please fax order for 4-Pronged cane with a new detailed order including patient's name, DOB, demographics, insurance information and his height & weight  Please fax to ATTN: Almond Lint @ [phone]

## 2015-04-22 NOTE — Telephone Encounter (Signed)
Spoke with wife- Physical Therapy recommended hurry cane. Wife stated she will talk to physical therapy tomorrow and then try to order via the internet.

## 2015-04-23 ENCOUNTER — Ambulatory Visit (HOSPITAL_COMMUNITY): Payer: 59 | Admitting: Physical Therapy

## 2015-04-23 ENCOUNTER — Ambulatory Visit (HOSPITAL_COMMUNITY): Payer: 59 | Admitting: Speech Pathology

## 2015-04-23 DIAGNOSIS — R29898 Other symptoms and signs involving the musculoskeletal system: Secondary | ICD-10-CM | POA: Diagnosis not present

## 2015-04-23 DIAGNOSIS — R471 Dysarthria and anarthria: Secondary | ICD-10-CM

## 2015-04-23 DIAGNOSIS — I69919 Unspecified symptoms and signs involving cognitive functions following unspecified cerebrovascular disease: Secondary | ICD-10-CM

## 2015-04-23 DIAGNOSIS — Z9181 History of falling: Secondary | ICD-10-CM

## 2015-04-23 DIAGNOSIS — R2681 Unsteadiness on feet: Secondary | ICD-10-CM

## 2015-04-23 DIAGNOSIS — R262 Difficulty in walking, not elsewhere classified: Secondary | ICD-10-CM

## 2015-04-23 DIAGNOSIS — R269 Unspecified abnormalities of gait and mobility: Secondary | ICD-10-CM

## 2015-04-23 NOTE — Therapy (Signed)
[facility] [address] [place], [place], [address] Phone: [phone]   Fax:  [phone]  Physical Therapy Treatment (Re-Assessment/Re-Eval)  Patient Details  Name: Travis [name] MRN: [id number] Date of Birth: 18-Feb-1961 Referring Provider: [name] MD   Encounter Date: 04/23/2015      PT End of Session - 04/23/15 1616    Visit Number 9   Number of Visits 18   Date for PT Re-Evaluation 05/21/15   Authorization Type UMR   Authorization Time Period 04/23/15 to 06/24/15   PT Start Time 1518   PT Stop Time 1558   PT Time Calculation (min) 40 min   Equipment Utilized During Treatment Gait belt   Activity Tolerance Patient tolerated treatment well   Behavior During Therapy [facility] for tasks assessed/performed      Past Medical History  Diagnosis Date  . Night sweats     every once in a while  . Hyperlipidemia   . Diabetes mellitus   . Hernia, inguinal   . Dizziness   . Chronic headaches   . Arthritis   . Neuropathy of lower extremity   . Diabetic peripheral neuropathy ([place]) 03/28/2013  . Diverticulosis   . Hypertension   . Complication of anesthesia     bleed after last shoulder-had to stay overnight  . Impingement syndrome of left shoulder 08/02/2014  . 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 Multi-infarct state ([place]) 10/14/2014  . Depression   . Anxiety   . Stroke [facility])     Past Surgical History  Procedure Laterality Date  . Inguinal hernia repair Bilateral   . Cervical disc surgery    . Wrist surgery Right     fusion  . Shoulder arthroscopy Right     1  . Knee arthroscopy Right   . Colon surgery  2003    1/3 removed for diverticulitis  . Colonoscopy    . Umbilical hernia repair      with other hernia repair with mesh  . Shoulder arthroscopy Left 08/02/2014    Procedure: LEFT SHOULDER SCOPE DEBRIDEMENT/ACROMIOPLASTY;  Surgeon: [name], MD;  Location: [facility];  Service:  Orthopedics;  Laterality: Left;  ANESTHESIA: GENERAL, PRE/POST OP SCALENE  . Tee without cardioversion N/A 03/03/2015    Procedure: TRANSESOPHAGEAL ECHOCARDIOGRAM (TEE);  Surgeon: [name], MD;  [facility];  Service: Cardiology;  Laterality: N/A;    There were no vitals filed for this visit.  Visit Diagnosis:  Weakness of right leg - Plan: PT plan of care cert/re-cert  Abnormality of gait - Plan: PT plan of care cert/re-cert  Risk for falls - Plan: PT plan of care cert/re-cert  Difficulty walking - Plan: PT plan of care cert/re-cert  Unsteadiness - Plan: PT plan of care cert/re-cert      Subjective Assessment - 04/23/15 1521    Subjective Patient back for re-assess by primary treatment PT for further assessment and establishment of more advanced goals. Patient reports that he has been very tired recently and very busy trying to keep up with family during [place] shopping right now. Arrives using walking stick. He reports that his endurance is still a big problem; no recent falls recently but has had one or two close calls. Still having dizziness. Still having wrist pain. Walking in crowds is difficult, also having difficulty walking over gravel and uneven surfaces. Patient reports that he has not had the opportunity to try multitasking with mobility.  Pertinent History Patient had been seen this year for vertigo treatments however then had cerebellar stroke, now being seen for this at skilled PT services.    Patient Stated Goals Get back to PLOF, walk with no AD   Currently in Pain? Yes   Pain Score 7    Pain Location Wrist   Pain Orientation Right            [facility] PT Assessment - 04/23/15 0001    Assessment   Medical Diagnosis s   Referring Provider scott luking MD    Next MD Visit Luking in the new year    Precautions   Precautions Fall   Prior Function   Level of Independence Independent   Vocation On disability   Leisure Enjoys yardwork,  walking   High Level Balance   High Level Balance Comments assessed a variety of functional dynamic balance skills, including: multitasking verbally and physically during gait; gait over unsteady surfaces; navigation through tight spaces. Noted unsteadiness during advanced functional tasks espeically with mobility over uneven surfaes and physical multitasking issues. Difficulty maintaining balance during these tasks, min guard with noted instability and fall risk.                              PT Education - 04/23/15 1616    Education provided Yes   Education Details reasons for resuming skilled PT services, plan of care moving forward to focus on dynamic functional balance based tasks    Person(s) Educated Patient   Methods Explanation   Comprehension Verbalized understanding          PT Short Term Goals - 04/23/15 1627    PT SHORT TERM GOAL #1   Title Pt will be independent with HEP.   Baseline 04/04/2015:  Reports compliance daily   Time 3   Period Weeks   Status Achieved   PT SHORT TERM GOAL #2   Title Increase LE strength to improve five time sit to stand to 12 seconds or less.    Baseline 04/04/2015 5 STS 10.13   Time 3   Period Weeks   Status Achieved   PT SHORT TERM GOAL #3   Title Decrease fall risk evidenced by TUG time of 15 seconds or less.   Baseline 06/04/2014: TUG with SPC 9.06 seconds   Time 3   Period Weeks   Status New   PT SHORT TERM GOAL #4   Title Improve gait speed evidenced by 475 ft or more ambulated during 3 minute walk test.    Time 3   Period Weeks   Status Achieved   PT SHORT TERM GOAL #5   Title Patient to demonstrate ability to safely ambulate with new hurricane (currently in process of being ordered) in order to demonstrate good safety and minimal fall risk in community    Time 3   Period Weeks   Status New   Additional Short Term Goals   Additional Short Term Goals Yes   PT SHORT TERM GOAL #6   Title Patient will  demonstrate minimal unsteadiness when ambulating over uneven surfaces with and without hurricane, good foot clearance, and minimal fall risk in order to enhance mobility at home and in community    Time 3   Period Weeks   Status New           PT Long Term Goals - 04/23/15 1704    PT LONG TERM GOAL #1  Title Pt to be independent with advanced HEP.   Time 6   Period Weeks   Status On-going   PT LONG TERM GOAL #2   Title Improve RLE strength to 4/5 or greater to improve gait mechanics and functional mobility.   Time 6   Period Weeks   Status Achieved   PT LONG TERM GOAL #3   Title Improve BLE strength and power evidenced by five time sit to stand time of 10 seconds or less.   Baseline 04/04/2015 5 STS 8.34 seconds no HHA   Time 6   Period Weeks   Status Achieved   PT LONG TERM GOAL #4   Title Decrease fall risk evidenced by 13 seconds or less on TUG.    Baseline 04/04/2015: TUG 09.06 with SPC   Time 6   Period Weeks   Status Achieved   PT LONG TERM GOAL #5   Title Pt will ambulate 1,000 feet or greater during 6MWT using LRAD or no AD to demonstrate improved gait speed and to return pt to community ambulation.    Baseline 04/04/2015: 6 minute 1,243 feet with SPC   Time 6   Period Weeks   Status Achieved   Additional Long Term Goals   Additional Long Term Goals Yes   PT LONG TERM GOAL #6   Title Patient to be able to perform variety of physical multitasking activities during dynamic gait with no assistive device and while responding to randomized verbal stimuli with minimal unsteadiness and in forwards and backwards directions, also with no assistive device in order to reduce overall fall risk and optimize function in dynamic community envirionment    Time 6   Period Weeks   Status New   PT LONG TERM GOAL #7   Title Patient to report that he has been successfully able to navigate crowded store with minimal challenge or difficulty with spouse and LRAD, will also demonstrate  ability to demonstrate correct safe response to random dynamic stimuli and perturbations with minimal instability or LOB in clinic in order to evidence ability to safely interact with and participate in dynamic community envirionments    Time 6   Period Weeks   Status New   PT LONG TERM GOAL #8   Title Patient to report that he has successfully been able to walk at least 1/2 mile with only minimal to moderate fatigue afterwards in order to address his reduced functional activity tolerance, improve abiltiy to participate in community based tasks, and to reduce overall fatigue releated fall risk    Time 6   Period Weeks   Status New               Plan - 04/23/15 1617    Clinical Impression Statement Re-assessment performed today by primary treatment PT due to concern over patient's ability to perform higher level functional advanced balance skills. Assessed a variety of advanced functional skills today, including DGI skills (not entire formal test), multitasking verbally and physically during dynamic giat, and gait over unsteady surfaces. Patient also reports that he has quite a bit of difficulty with navigation through crowded areas and still feels that he tires very easily- he even listed a goal of being able to walk the [place] trail locally without getting tired as it gets warmer. At this time recommend re-starting and continuing skilled PT services due to difficulty/unsteadiness/fall risk during dynamic functional tasks today in order to reduce overall fall risk and improve overall function at home and in  his community.    Pt will benefit from skilled therapeutic intervention in order to improve on the following deficits Abnormal gait;Decreased activity tolerance;Decreased balance;Decreased coordination;Decreased endurance;Decreased mobility;Decreased safety awareness;Decreased strength;Difficulty walking   Rehab Potential Fair   PT Frequency 2x / week   PT Duration 4 weeks   PT  Treatment/Interventions ADLs/Self Care Home Management;Gait training;Stair training;Functional mobility training;Therapeutic activities;Balance training;Neuromuscular re-education;Patient/family education;Therapeutic exercise;Manual techniques   PT Next Visit Plan focus on advanced functional tasks based around multitasking, unsteady surfaces, endurance, navigation through crowds/tight environments, safe use of hurricane    PT Home Exercise Plan given   Consulted and Agree with Plan of Care Patient        Problem List Patient Active Problem List   Diagnosis Date Noted  . OSA (obstructive sleep apnea) 04/18/2015  . 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 artery ([place]) 04/18/2015  . Obstructive sleep apnea 03/19/2015  . Cerebellar stroke ([place]) 02/28/2015  . Snoring 01/23/2015  . Degeneration of cervical intervertebral disc 01/23/2015  . Essential hypertension 12/14/2014  . Type 2 diabetes mellitus with other circulatory complications ([place]) [id number]  . HLD (hyperlipidemia) 12/14/2014  . Neck pain 12/14/2014  . Cerebral infarction, chronic   . Dizzy   . Vertigo 10/14/2014  . Multi-infarct state ([place]) 10/14/2014  . CVA (cerebral infarction) 10/14/2014  . Impingement syndrome of left shoulder 08/02/2014  . 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 Abdominal pain 06/13/2014  . Rectal bleeding 03/13/2014  . Diabetes type 2, controlled ([place]) 02/18/2014  . Hyperlipidemia 02/18/2014  . Diabetic peripheral neuropathy ([place]) 03/28/2013  . Irreducible incisional hernia 11/11/2010   Physical Therapy Progress Note  Dates of Reporting Period: 04/04/15 to 04/23/15  Objective Reports of Subjective Statement: see above   Objective Measurements: see above   Goal Update: see above   Plan: see above   Reason Skilled Services are Required: focus on advanced functional skills related to gait over unsteady surface, multitasking, functional activity tolerance, and  crowd navigation     Deniece Ree PT, DPT [place], [place], [address] Phone: [phone]   Fax:  [phone]  Name: Travis [name] MRN: [id number] Date of Birth: [birth year]

## 2015-04-23 NOTE — Therapy (Signed)
[facility] [place], [place], [address] Phone: [phone]   Fax:  [phone]  Speech Language Pathology Treatment  Patient Details  Name: Travis [name] MRN: [id number] Date of Birth: 05/08/1960 Referring Provider: Dr. [name]  Encounter Date: 04/23/2015      End of Session - 04/23/15 1524    Visit Number 9   Number of Visits 12   Date for SLP Re-Evaluation 05/15/15   Authorization Type UMR   SLP Start Time 1433   SLP Stop Time  1515   SLP Time Calculation (min) 42 min   Activity Tolerance Patient tolerated treatment well      Past Medical History  Diagnosis Date  . Night sweats     every once in a while  . Hyperlipidemia   . Diabetes mellitus   . Hernia, inguinal   . Dizziness   . Chronic headaches   . Arthritis   . Neuropathy of lower extremity   . Diabetic peripheral neuropathy ([place]) 03/28/2013  . Diverticulosis   . Hypertension   . Complication of anesthesia     bleed after last shoulder-had to stay overnight  . Impingement syndrome of left shoulder 08/02/2014  . 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 Multi-infarct state ([place]) 10/14/2014  . Depression   . Anxiety   . Stroke [facility])     Past Surgical History  Procedure Laterality Date  . Inguinal hernia repair Bilateral   . Cervical disc surgery    . Wrist surgery Right     fusion  . Shoulder arthroscopy Right     1  . Knee arthroscopy Right   . Colon surgery  2003    1/3 removed for diverticulitis  . Colonoscopy    . Umbilical hernia repair      with other hernia repair with mesh  . Shoulder arthroscopy Left 08/02/2014    Procedure: LEFT SHOULDER SCOPE DEBRIDEMENT/ACROMIOPLASTY;  Surgeon: [name], MD;  Location: [facility];  Service: Orthopedics;  Laterality: Left;  ANESTHESIA: GENERAL, PRE/POST OP SCALENE  . Tee without cardioversion N/A 03/03/2015    Procedure: TRANSESOPHAGEAL ECHOCARDIOGRAM (TEE);   Surgeon: [name], MD;  [facility];  Service: Cardiology;  Laterality: N/A;    There were no vitals filed for this visit.  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 No/denies               ADULT SLP TREATMENT - 04/23/15 1523    General Information   Behavior/Cognition Alert;Cooperative;Pleasant mood   Patient Positioning Upright in chair   Oral care provided N/A   HPI 54 yo male with past medical history of CVA who presents for cognitive linguistic therapy.   Treatment Provided   Treatment provided Cognitive-Linquistic   Pain Assessment   Pain Assessment No/denies pain   Cognitive-Linquistic Treatment   Treatment focused on Cognition;Patient/family/caregiver education   Skilled Treatment flexibility in thinking, thought organization, problem solving, planning; oral reading of paragraph length material and tongue twisters   Assessment / Recommendations / [facility] with current plan of care   Progression Toward Goals   Progression toward goals Progressing toward goals            SLP Short Term Goals - 04/23/15 1532  SLP SHORT TERM GOAL #1   Title Pt will implement memory strategies in functional tasks for 90% acc with min assist   Time 6   Period Weeks   Status Partially Met   SLP SHORT TERM GOAL #2   Title Pt will complete mod level thought organization and deductive reasoning tasks with 90% acc with use of strategies and min assist.    Time 6   Period Weeks   Status Partially Met   SLP SHORT TERM GOAL #3   Title Pt will identify appropriate strategies to use in hypothetical everyday problem solving scenarios with min assist.    Time 6   Period Weeks   Status Achieved   SLP SHORT TERM GOAL #4   Title Pt will implement speech intelligibility strategies with  95% acc during conversations to increase naturalness and intelligibility of speech.   Baseline 75% acc   Time 8   Period Weeks   Status Partially Met          SLP Long Term Goals - 04/23/15 1532    SLP LONG TERM GOAL #1   Title Pt will communicate complex thoughts and feelings to [facility] with use of strategies. Pt will also be independent with use of memory and thought organization strategies.   Baseline min impairment   Time 3   Period Months   Status On-going          Plan - 04/23/15 1525    Clinical Impression Statement Mr. Vaca describes some dismay over the past few days. His granddaughter was at his home for the weekend and it was very draining for him. He ran errands with his wife in [place] last night and reports feeling exhausted from the late night. He wants to try to continue doing activities he previously did, but finds fatigue to be an issue. SLP explained that fatigue is often an issue for people even up to a year post stroke and he was encouraged to schedule rest breaks to prevent over fatigue. He also reported feeling pleased that Dr. Erlinda Hong had said he could drive during the day at this time. Session focused on memory excercises (Recall with a distractor) and he completed with 72% acc when given mild/mod cues for strategies from SLP. He was encouraged to use association and visualization strategies. He became distracted from task when he commented on irrelevant material which negatively impacted recall of the important information. SLP reviewed that another 3 sessions were recommended to address goals and that they could be spread over 3 weeks if he would like. He plans to discuss with his wife. Continue POC.    Speech Therapy Frequency 2x / week   Duration 4 weeks   Treatment/Interventions Functional tasks;SLP instruction and feedback;Compensatory strategies;Patient/family education;Internal/external aids;Cognitive reorganization;Cueing hierarchy   Potential to Achieve  Goals Good   Potential Considerations Co-morbidities   SLP Home Exercise Plan Pt will be independent in HEP as assigned to facilitate carryover of treatment strategies and techniques in home and community environment   Consulted and Agree with Plan of Care Patient        Problem List Patient Active Problem List   Diagnosis Date Noted  . OSA (obstructive sleep apnea) 04/18/2015  . 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 artery ([place]) 04/18/2015  . Obstructive sleep apnea 03/19/2015  . Cerebellar stroke ([place]) 02/28/2015  . Snoring 01/23/2015  . Degeneration of cervical intervertebral disc 01/23/2015  . Essential hypertension 12/14/2014  . Type 2 diabetes mellitus with  other circulatory complications ([place]) 25/85/2778  . HLD (hyperlipidemia) 12/14/2014  . Neck pain 12/14/2014  . Cerebral infarction, chronic   . Dizzy   . Vertigo 10/14/2014  . Multi-infarct state ([place]) 10/14/2014  . CVA (cerebral infarction) 10/14/2014  . Impingement syndrome of left shoulder 08/02/2014  . 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 Abdominal pain 06/13/2014  . Rectal bleeding 03/13/2014  . Diabetes type 2, controlled ([place]) 02/18/2014  . Hyperlipidemia 02/18/2014  . Diabetic peripheral neuropathy ([place]) 03/28/2013  . Irreducible incisional hernia 11/11/2010   Thank you,  Genene Churn, [place]  [facility] 04/23/2015, 3:33 PM  [facility] [address] [place], [place], [address] Phone: [phone]   Fax:  [phone]   Name: Travis [name] MRN: [id number] Date of Birth: 1961-01-26

## 2015-04-24 ENCOUNTER — Ambulatory Visit (INDEPENDENT_AMBULATORY_CARE_PROVIDER_SITE_OTHER): Payer: 59 | Admitting: Internal Medicine

## 2015-04-24 ENCOUNTER — Encounter (HOSPITAL_COMMUNITY): Payer: 59 | Admitting: Occupational Therapy

## 2015-04-24 ENCOUNTER — Ambulatory Visit (HOSPITAL_COMMUNITY): Payer: 59 | Admitting: Speech Pathology

## 2015-04-24 ENCOUNTER — Encounter: Payer: Self-pay | Admitting: Internal Medicine

## 2015-04-24 VITALS — BP 116/58 | HR 72 | Ht 73.0 in | Wt 182.0 lb

## 2015-04-24 DIAGNOSIS — E785 Hyperlipidemia, unspecified: Secondary | ICD-10-CM | POA: Diagnosis not present

## 2015-04-24 DIAGNOSIS — I10 Essential (primary) hypertension: Secondary | ICD-10-CM

## 2015-04-24 DIAGNOSIS — I63443 Cerebral infarction due to embolism of bilateral cerebellar arteries: Secondary | ICD-10-CM | POA: Diagnosis not present

## 2015-04-24 NOTE — Patient Instructions (Addendum)
Your physician recommends that you schedule a follow-up appointment in: to be determined    Go to the [facility] at [facility], next Thursday 12/129/16 at 6:30 am and either Dr Rayann Heman or Dr Caryl Comes will to your LINQ procedure  Hold metformin and glyburide the morning of procedure  NOTHING TO EAT OR DRINK AFTER MIDNIGHT ON Wednesday      YOU MAY TAKE YOUR MEDICATIONS IN THE MORNING WITH A SIP OF WATER     Thank you for choosing [facility] !

## 2015-04-24 NOTE — Progress Notes (Signed)
Cardiology Office Note   Date:  04/24/2015   ID:  Travis Travis [name], DOB Apr 30, 1961, MRN [id number]  PCP:  Travis Lange, MD  Cardiologist:   Travis Carnes, MD   Presents for eval for possible LINQ    History of Present Illness: Travis Travis [name] is a 54 y.o. male with a history of HTN, HL, DM and cerebellar strokes.  Admitted in June 2016     [place] in June showed multipbe lacunar strokes in past in bilateral cerebellum   Readmitted with CVA in October 2016  TEE done on this admit Small PFO on TEE  Put on dual antiplatelet Rx  Pt reportedly wore an event monitor (per Travis Travis [name] note) that did not show afib Has sleep apnea but has not started CPAP yet   Too much going on  Due to have carotid and LE vascular studies soon  He denies CP  No SOB  No palpitations    Current Outpatient Prescriptions  Medication Sig Dispense Refill  . aspirin 325 MG tablet Take 1 tablet (325 mg total) by mouth daily.    Marland Kitchen atorvastatin (LIPITOR) 20 MG tablet Take 1 tablet (20 mg total) by mouth daily. 90 tablet 1  . baclofen (LIORESAL) 10 MG tablet Take 1 tablet (10 mg total) by mouth 3 (three) times daily. As needed for muscle spasm 50 tablet 0  . Cholecalciferol (VITAMIN D) 400 UNITS capsule Take 400 Units by mouth daily.      . clopidogrel (PLAVIX) 75 MG tablet Take 1 tablet (75 mg total) by mouth daily with breakfast. 90 tablet 1  . Cyanocobalamin (VITAMIN B12 PO) Take by mouth daily.      . folic acid (FOLVITE) 1 MG tablet Take 1 tablet (1 mg total) by mouth daily. 90 tablet 1  . glyBURIDE micronized (GLYNASE) 6 MG tablet TAKE 1 TABLET BY MOUTH 2 TIMES DAILY. 180 tablet 1  . lisinopril (PRINIVIL,ZESTRIL) 2.5 MG tablet TAKE 1 TABLET BY MOUTH EVERY MORNING. 90 tablet 1  . meclizine (ANTIVERT) 25 MG tablet Take 1 tablet (25 mg total) by mouth 3 (three) times daily as needed for dizziness. 30 tablet 3  . meloxicam (MOBIC) 15 MG tablet Take 15 mg by mouth daily.    . meloxicam (MOBIC) 15 MG tablet TAKE 1 TABLET BY  MOUTH ONCE DAILY 30 tablet PRN  . metFORMIN (GLUCOPHAGE) 500 MG tablet Take 2 tablets (1,000 mg total) by mouth 2 (two) times daily with a meal. 360 tablet 1  . pregabalin (LYRICA) 100 MG capsule Take 1 capsule (100 mg total) by mouth 3 (three) times daily. 270 capsule 1  . topiramate (TOPAMAX) 25 MG tablet TAKE 2 TABLETS BY MOUTH 2 TIMES DAILY. 120 tablet 1  . traMADol (ULTRAM) 50 MG tablet Take 50 mg by mouth 4 (four) times daily.   0  . valACYclovir (VALTREX) 1000 MG tablet Take 1,000 mg by mouth daily.   3   No current facility-administered medications for this visit.    Allergies:   Review of patient's allergies indicates no known allergies.   Past Medical History  Diagnosis Date  . Night sweats     every once in a while  . Hyperlipidemia   . Diabetes mellitus   . Hernia, inguinal   . Dizziness   . Chronic headaches   . Arthritis   . Neuropathy of lower extremity   . Diabetic peripheral neuropathy ([place]) 03/28/2013  . Diverticulosis   . Hypertension   . Complication  of anesthesia     bleed after last shoulder-had to stay overnight  . Impingement syndrome of left shoulder 08/02/2014  . 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 Multi-infarct state ([place]) 10/14/2014  . Depression   . Anxiety   . Stroke [facility])     Past Surgical History  Procedure Laterality Date  . Inguinal hernia repair Bilateral   . Cervical disc surgery    . Wrist surgery Right     fusion  . Shoulder arthroscopy Right     1  . Knee arthroscopy Right   . Colon surgery  2003    1/3 removed for diverticulitis  . Colonoscopy    . Umbilical hernia repair      with other hernia repair with mesh  . Shoulder arthroscopy Left 08/02/2014    Procedure: LEFT SHOULDER SCOPE DEBRIDEMENT/ACROMIOPLASTY;  Surgeon: [name], MD;  Location: [facility];  Service: Orthopedics;  Laterality: Left;  ANESTHESIA: GENERAL, PRE/POST OP SCALENE  . Tee without cardioversion N/A 03/03/2015    Procedure:  TRANSESOPHAGEAL ECHOCARDIOGRAM (TEE);  Surgeon: [name], MD;  [facility];  Service: Cardiology;  Laterality: N/A;     Social History:  The patient  reports that he quit smoking about 37 years ago. His smoking use included Cigarettes. He started smoking about 44 years ago. He has a 8 pack-year smoking history. He quit smokeless tobacco use about 37 years ago. His smokeless tobacco use included Chew. He reports that he drinks about 0.6 oz of alcohol per week. He reports that he does not use illicit drugs.   Family History:  The patient's family history includes ALS (age of onset: 22) in his brother; Dementia in his mother; Diabetes in his maternal grandfather; Heart attack (age of onset: 4) in his sister; Hyperlipidemia in his father; Stroke in his father.    ROS:  Please see the history of present illness. All other systems are reviewed and  Negative to the above problem except as noted.    PHYSICAL EXAM: VS:  BP 116/58 mmHg  Pulse 72  Ht 6\' 1"  (1.854 m)  Wt 82.555 kg (182 lb)  BMI 24.02 kg/m2  SpO2 95%  GEN: Well nourished, well developed, in no acute distressSome slurred speech   HEENT: normal Neck: no JVD, carotid bruits, or masses Cardiac: RRR; no murmurs, rubs, or gallops,no edema  Respiratory:  clear to auscultation bilaterally, normal work of breathing GI: soft, nontender, nondistended, + BS  No hepatomegaly  MS: no deformity Moving all extremities   Skin: warm and dry, no rash Neuro  Deferred exam     EKG:  EKG is not  ordered today. On 03/03/15  SR 66 bpm     Lipid Panel    Component Value Date/Time   CHOL 136 03/01/2015 0517   TRIG 132 03/01/2015 0517   HDL 29* 03/01/2015 0517   CHOLHDL 4.7 03/01/2015 0517   VLDL 26 03/01/2015 0517   LDLCALC 81 03/01/2015 0517      Wt Readings from Last 3 Encounters:  04/24/15 82.555 kg (182 lb)  04/17/15 83.19 kg (183 lb 6.4 oz)  03/07/15 80.74 kg (178 lb)      ASSESSMENT AND PLAN:  1.  CVA   Multiple  Now on Plavix and ASA I have reviewed with Travis [name]  Pt has been debilitated by events Would be improtant to r/o afib so that he does not have another   Will set up for LINQ to  be done on 12/29 by Travis Rayann Heman or Caryl Comes Keep on ASA and Plavix  2.  HTN  Continue meds   3.  HL  Would keep on lipitor    F/U determined after procedure          Signed, Travis Carnes, MD  04/24/2015 1:28 PM    [facility] [address], [place], [place]  [address] Phone: [phone]; Fax: [phone]

## 2015-04-25 ENCOUNTER — Encounter (HOSPITAL_COMMUNITY): Payer: 59 | Admitting: Occupational Therapy

## 2015-04-25 ENCOUNTER — Ambulatory Visit (HOSPITAL_BASED_OUTPATIENT_CLINIC_OR_DEPARTMENT_OTHER)
Admission: RE | Admit: 2015-04-25 | Discharge: 2015-04-25 | Disposition: A | Discharge status: Home / Self Care | Payer: 59 | Source: Ambulatory Visit | Attending: Neurology | Admitting: Neurology

## 2015-04-25 ENCOUNTER — Ambulatory Visit (HOSPITAL_COMMUNITY): Payer: 59

## 2015-04-25 ENCOUNTER — Ambulatory Visit (HOSPITAL_COMMUNITY)
Admission: RE | Admit: 2015-04-25 | Discharge: 2015-04-25 | Disposition: A | Discharge status: Home / Self Care | Payer: 59 | Source: Ambulatory Visit | Attending: Neurology | Admitting: Neurology

## 2015-04-25 DIAGNOSIS — Z7982 Long term (current) use of aspirin: Secondary | ICD-10-CM | POA: Insufficient documentation

## 2015-04-25 DIAGNOSIS — Z8673 Personal history of transient ischemic attack (TIA), and cerebral infarction without residual deficits: Secondary | ICD-10-CM | POA: Insufficient documentation

## 2015-04-25 DIAGNOSIS — E785 Hyperlipidemia, unspecified: Secondary | ICD-10-CM | POA: Insufficient documentation

## 2015-04-25 DIAGNOSIS — Z79899 Other long term (current) drug therapy: Secondary | ICD-10-CM | POA: Insufficient documentation

## 2015-04-25 DIAGNOSIS — Z7902 Long term (current) use of antithrombotics/antiplatelets: Secondary | ICD-10-CM | POA: Diagnosis not present

## 2015-04-25 DIAGNOSIS — R948 Abnormal results of function studies of other organs and systems: Secondary | ICD-10-CM | POA: Diagnosis not present

## 2015-04-25 DIAGNOSIS — I639 Cerebral infarction, unspecified: Secondary | ICD-10-CM | POA: Diagnosis not present

## 2015-04-25 DIAGNOSIS — I10 Essential (primary) hypertension: Secondary | ICD-10-CM | POA: Insufficient documentation

## 2015-04-25 NOTE — Progress Notes (Signed)
*  PRELIMINARY RESULTS* Vascular Ultrasound Lower extremity venous duplex has been completed.  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 No acute DVT bilaterally.  Transcranial Doppler has been completed.    Attempted call report for venous duplex to Dr. Phoebe Sharps office. The office is closed for the holiday. Will let patient leave since thrombus is not acute.    Landry Mellow, RDMS, RVT  04/25/2015, 11:05 AM

## 2015-04-29 ENCOUNTER — Ambulatory Visit (HOSPITAL_COMMUNITY): Payer: 59 | Admitting: Speech Pathology

## 2015-04-29 ENCOUNTER — Ambulatory Visit (HOSPITAL_COMMUNITY): Payer: 59 | Admitting: Physical Therapy

## 2015-04-29 NOTE — Telephone Encounter (Signed)
[name] (wife) Called to confrim  apptment she wanted [place] to ask Alexis Goodell how many more apptment to make for [place]? Time or dates arent' important since Constance Holster can drive now. NF 12/27/201

## 2015-04-30 ENCOUNTER — Ambulatory Visit (HOSPITAL_COMMUNITY): Payer: 59 | Admitting: Speech Pathology

## 2015-04-30 DIAGNOSIS — R29898 Other symptoms and signs involving the musculoskeletal system: Secondary | ICD-10-CM | POA: Diagnosis not present

## 2015-04-30 DIAGNOSIS — I69919 Unspecified symptoms and signs involving cognitive functions following unspecified cerebrovascular disease: Secondary | ICD-10-CM

## 2015-04-30 NOTE — Therapy (Signed)
[place], [place], [address] Phone: [phone]   Fax:  [phone]  Speech Language Pathology Treatment  Patient Details  Name: Travis [name] MRN: [id number] Date of Birth: 1960-06-17 Referring Provider: Dr. Sallee [name]  Encounter Date: 04/30/2015      End of Session - 04/30/15 2235    Visit Number 10   Number of Visits 12   Date for SLP Re-Evaluation 05/15/15   Authorization Type UMR   SLP Start Time 0802   SLP Stop Time  1256   SLP Time Calculation (min) 43 min   Activity Tolerance Patient tolerated treatment well      Past Medical History  Diagnosis Date  . Night sweats     every once in a while  . Hyperlipidemia   . Diabetes mellitus   . Hernia, inguinal   . Dizziness   . Chronic headaches   . Arthritis   . Neuropathy of lower extremity   . Diabetic peripheral neuropathy ([place]) 03/28/2013  . Diverticulosis   . Hypertension   . Complication of anesthesia     bleed after last shoulder-had to stay overnight  . Impingement syndrome of left shoulder 08/02/2014  . 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 Multi-infarct state ([place]) 10/14/2014  . Depression   . Anxiety   . Stroke [facility])     Past Surgical History  Procedure Laterality Date  . Inguinal hernia repair Bilateral   . Cervical disc surgery    . Wrist surgery Right     fusion  . Shoulder arthroscopy Right     1  . Knee arthroscopy Right   . Colon surgery  2003    1/3 removed for diverticulitis  . Colonoscopy    . Umbilical hernia repair      with other hernia repair with mesh  . Shoulder arthroscopy Left 08/02/2014    Procedure: LEFT SHOULDER SCOPE DEBRIDEMENT/ACROMIOPLASTY;  Surgeon: [name], MD;  Location: [facility];  Service: Orthopedics;  Laterality: Left;  ANESTHESIA: GENERAL, PRE/POST OP SCALENE  . Tee without cardioversion N/A 03/03/2015    Procedure: TRANSESOPHAGEAL ECHOCARDIOGRAM (TEE);   Surgeon: [name], MD;  [facility];  Service: Cardiology;  Laterality: N/A;    There were no vitals filed for this visit.  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 No/denies               ADULT SLP TREATMENT - 04/30/15 1633    General Information   Behavior/Cognition Alert;Cooperative;Pleasant mood   Patient Positioning Upright in chair   Oral care provided N/A   HPI 54 yo male with past medical history of CVA who presents for cognitive linguistic therapy.   Treatment Provided   Treatment provided Cognitive-Linquistic   Pain Assessment   Pain Assessment No/denies pain   Cognitive-Linquistic Treatment   Treatment focused on Cognition;Patient/family/caregiver education   Skilled Treatment flexibility in thinking, thought organization, problem solving, planning; oral reading of paragraph length material and tongue twisters   Assessment / Recommendations / [place] with current plan of care   Progression Toward Goals   Progression toward goals Progressing toward goals            SLP Short Term Goals - 04/30/15 2237  SLP SHORT TERM GOAL #1   Title Pt will implement memory strategies in functional tasks for 90% acc with min assist   Time 6   Period Weeks   Status Partially Met   SLP SHORT TERM GOAL #2   Title Pt will complete mod level thought organization and deductive reasoning tasks with 90% acc with use of strategies and min assist.    Time 6   Period Weeks   Status Partially Met   SLP SHORT TERM GOAL #3   Title Pt will identify appropriate strategies to use in hypothetical everyday problem solving scenarios with min assist.    Time 6   Period Weeks   Status Achieved   SLP SHORT TERM GOAL #4   Title Pt will implement speech intelligibility strategies with 95% acc  during conversations to increase naturalness and intelligibility of speech.   Baseline 75% acc   Time 8   Period Weeks   Status Partially Met          SLP Long Term Goals - 04/30/15 2237    SLP LONG TERM GOAL #1   Title Pt will communicate complex thoughts and feelings to [facility] with use of strategies. Pt will also be independent with use of memory and thought organization strategies.   Baseline min impairment   Time 3   Period Months   Status On-going          Plan - 04/30/15 2236    Clinical Impression Statement Travis [name] arrived late for therapy and apologized. He resumed driving during daytime hours per approval from Travis [name]. Pt reported some dismay over a doctor's appointment from last week, but had difficulty verbalizing what took place. SLP able to view records on EPIC and prompted pt to help fill in the gaps. Pt also shared about the loss of his older brother a few years ago to [place] and his sister to a heart attack. SLP acknowledged understanding his fears about his own health given the past year. SLP guided pt in stress reducing tactics and he acknowledges that time off work has been beneficial. SLP also reiterated use of memory strategies and ways to "stay on task" when required of him. Recommend 2 additional SLP sessions to complete education and implementation of strategies.   Speech Therapy Frequency 1x /week   Duration 2 weeks   Treatment/Interventions Functional tasks;SLP instruction and feedback;Compensatory strategies;Patient/family education;Internal/external aids;Cognitive reorganization;Cueing hierarchy   Potential to Achieve Goals Good   Potential Considerations Co-morbidities   SLP Home Exercise Plan Pt will be independent in HEP as assigned to facilitate carryover of treatment strategies and techniques in home and community environment   Consulted and Agree with Plan of Care Patient        Problem List Patient Active Problem List   Diagnosis Date Noted  .  OSA (obstructive sleep apnea) 04/18/2015  . 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 artery ([place]) 04/18/2015  . Obstructive sleep apnea 03/19/2015  . Cerebellar stroke ([place]) 02/28/2015  . Snoring 01/23/2015  . Degeneration of cervical intervertebral disc 01/23/2015  . Essential hypertension 12/14/2014  . Type 2 diabetes mellitus with other circulatory complications ([place]) 90/21/1155  . HLD (hyperlipidemia) 12/14/2014  . Neck pain 12/14/2014  . Cerebral infarction, chronic   . Dizzy   . Vertigo 10/14/2014  . Multi-infarct state ([place]) 10/14/2014  . CVA (cerebral infarction) 10/14/2014  . Impingement syndrome of left shoulder 08/02/2014  . 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 Abdominal pain  06/13/2014  . Rectal bleeding 03/13/2014  . Diabetes type 2, controlled ([place]) 02/18/2014  . Hyperlipidemia 02/18/2014  . Diabetic peripheral neuropathy ([place]) 03/28/2013  . Irreducible incisional hernia 11/11/2010    Tasmin Exantus 04/30/2015, 10:39 PM  [place], [place], [address] Phone: [phone]   Fax:  [phone]   Name: Travis [name] MRN: [id number] Date of Birth: [birth year]

## 2015-05-01 ENCOUNTER — Encounter (HOSPITAL_COMMUNITY): Payer: 59

## 2015-05-01 ENCOUNTER — Ambulatory Visit (HOSPITAL_COMMUNITY)
Admission: RE | Admit: 2015-05-01 | Discharge: 2015-05-01 | Disposition: A | Discharge status: Home / Self Care | Payer: 59 | Source: Ambulatory Visit | Attending: Internal Medicine | Admitting: Internal Medicine

## 2015-05-01 ENCOUNTER — Encounter (HOSPITAL_COMMUNITY): Payer: Self-pay | Admitting: Internal Medicine

## 2015-05-01 ENCOUNTER — Encounter (HOSPITAL_COMMUNITY)
Admission: RE | Disposition: A | Discharge status: Home / Self Care | Payer: Self-pay | Source: Ambulatory Visit | Attending: Internal Medicine

## 2015-05-01 ENCOUNTER — Ambulatory Visit (HOSPITAL_COMMUNITY): Payer: 59 | Admitting: Physical Therapy

## 2015-05-01 DIAGNOSIS — Z7902 Long term (current) use of antithrombotics/antiplatelets: Secondary | ICD-10-CM | POA: Insufficient documentation

## 2015-05-01 DIAGNOSIS — E1142 Type 2 diabetes mellitus with diabetic polyneuropathy: Secondary | ICD-10-CM | POA: Diagnosis not present

## 2015-05-01 DIAGNOSIS — M1990 Unspecified osteoarthritis, unspecified site: Secondary | ICD-10-CM | POA: Diagnosis not present

## 2015-05-01 DIAGNOSIS — I639 Cerebral infarction, unspecified: Secondary | ICD-10-CM | POA: Diagnosis not present

## 2015-05-01 DIAGNOSIS — I10 Essential (primary) hypertension: Secondary | ICD-10-CM | POA: Diagnosis not present

## 2015-05-01 DIAGNOSIS — Z7984 Long term (current) use of oral hypoglycemic drugs: Secondary | ICD-10-CM | POA: Insufficient documentation

## 2015-05-01 DIAGNOSIS — E785 Hyperlipidemia, unspecified: Secondary | ICD-10-CM | POA: Diagnosis not present

## 2015-05-01 DIAGNOSIS — Z7982 Long term (current) use of aspirin: Secondary | ICD-10-CM | POA: Diagnosis not present

## 2015-05-01 DIAGNOSIS — F419 Anxiety disorder, unspecified: Secondary | ICD-10-CM | POA: Diagnosis not present

## 2015-05-01 DIAGNOSIS — I638 Other cerebral infarction: Secondary | ICD-10-CM | POA: Insufficient documentation

## 2015-05-01 DIAGNOSIS — G4489 Other headache syndrome: Secondary | ICD-10-CM | POA: Diagnosis not present

## 2015-05-01 DIAGNOSIS — F329 Major depressive disorder, single episode, unspecified: Secondary | ICD-10-CM | POA: Diagnosis not present

## 2015-05-01 HISTORY — PX: EP IMPLANTABLE DEVICE: SHX172B

## 2015-05-01 LAB — GLUCOSE, CAPILLARY: GLUCOSE-CAPILLARY: 109 mg/dL — AB (ref 65–99)

## 2015-05-01 SURGERY — LOOP RECORDER INSERTION
Anesthesia: LOCAL

## 2015-05-01 MED ORDER — LIDOCAINE-EPINEPHRINE 1 %-1:100000 IJ SOLN
INTRAMUSCULAR | Status: DC | PRN
  Administered 2015-05-01: 20 mL

## 2015-05-01 MED ORDER — LIDOCAINE-EPINEPHRINE 1 %-1:100000 IJ SOLN
INTRAMUSCULAR | Status: AC
  Filled 2015-05-01: qty 1

## 2015-05-01 SURGICAL SUPPLY — 2 items
LOOP REVEAL LINQSYS (Prosthesis & Implant Heart) ×1 IMPLANT
PACK LOOP INSERTION (CUSTOM PROCEDURE TRAY) ×2 IMPLANT

## 2015-05-01 NOTE — CV Procedure (Signed)
.  Pre op Dx Cryptogenic Stroke  Post op Dx same  Procedure  Loop Recorder implantation  After routine prep and drape of the left parasternal area, a small incision was created. A Medtronic LINQ Reveal Loop Recorder  Serial Number  [id number] was inserted.    SteriStrip dressing was  applied.  The patient tolerated the procedure without apparent complication.

## 2015-05-01 NOTE — H&P (Signed)
Patient Care Team: Kathyrn Drown, MD as PCP - General (Family Medicine)   HPI  Travis [name] is a 54 y.o. male With cryptogenic stroke referred for loop recorder Hx of HTN DM and HL with cerebellar strokes June 2016 MRI >> lacunar strokes  REcurrent CVA 10/16 with neg 30 day recorder TEE small PFO neg dopplers; normal LV function Rx with DAPT  Denies sob or cp   Records and Results Reviewed hosptial records and office notes and studeis as outline  Past Medical History  Diagnosis Date  . Night sweats     every once in a while  . Hyperlipidemia   . Diabetes mellitus   . Hernia, inguinal   . Dizziness   . Chronic headaches   . Arthritis   . Neuropathy of lower extremity   . Diabetic peripheral neuropathy ([place]) 03/28/2013  . Diverticulosis   . Hypertension   . Complication of anesthesia     bleed after last shoulder-had to stay overnight  . Impingement syndrome of left shoulder 08/02/2014  . 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 Multi-infarct state ([place]) 10/14/2014  . Depression   . Anxiety   . Stroke [facility])     Past Surgical History  Procedure Laterality Date  . Inguinal hernia repair Bilateral   . Cervical disc surgery    . Wrist surgery Right     fusion  . Shoulder arthroscopy Right     1  . Knee arthroscopy Right   . Colon surgery  2003    1/3 removed for diverticulitis  . Colonoscopy    . Umbilical hernia repair      with other hernia repair with mesh  . Shoulder arthroscopy Left 08/02/2014    Procedure: LEFT SHOULDER SCOPE DEBRIDEMENT/ACROMIOPLASTY;  Surgeon: [name], MD;  Location: [facility];  Service: Orthopedics;  Laterality: Left;  ANESTHESIA: GENERAL, PRE/POST OP SCALENE  . Tee without cardioversion N/A 03/03/2015    Procedure: TRANSESOPHAGEAL ECHOCARDIOGRAM (TEE);  Surgeon: [name], MD;  [facility];  Service: Cardiology;  Laterality: N/A;  . Ep implantable device N/A 05/01/2015   Procedure: Loop Recorder Insertion;  Surgeon: [name], MD;  Location: [facility];  Service: Cardiovascular;  Laterality: N/A;    No current facility-administered medications for this encounter.   Current Outpatient Prescriptions  Medication Sig Dispense Refill  . aspirin 325 MG tablet Take 1 tablet (325 mg total) by mouth daily.    Marland Kitchen atorvastatin (LIPITOR) 20 MG tablet Take 1 tablet (20 mg total) by mouth daily. 90 tablet 1  . Cholecalciferol (VITAMIN D) 400 UNITS capsule Take 400 Units by mouth daily.      . clopidogrel (PLAVIX) 75 MG tablet Take 1 tablet (75 mg total) by mouth daily with breakfast. 90 tablet 1  . Cyanocobalamin (VITAMIN B12 PO) Take by mouth daily.      . folic acid (FOLVITE) 1 MG tablet Take 1 tablet (1 mg total) by mouth daily. 90 tablet 1  . glyBURIDE micronized (GLYNASE) 6 MG tablet TAKE 1 TABLET BY MOUTH 2 TIMES DAILY. 180 tablet 1  . lisinopril (PRINIVIL,ZESTRIL) 2.5 MG tablet TAKE 1 TABLET BY MOUTH EVERY MORNING. 90 tablet 1  . meloxicam (MOBIC) 15 MG tablet TAKE 1 TABLET BY MOUTH ONCE DAILY 30 tablet PRN  . metFORMIN (GLUCOPHAGE) 500 MG tablet Take 2 tablets (1,000 mg total) by mouth 2 (two) times daily with a meal. 360 tablet  1  . pregabalin (LYRICA) 100 MG capsule Take 1 capsule (100 mg total) by mouth 3 (three) times daily. 270 capsule 1  . topiramate (TOPAMAX) 25 MG tablet TAKE 2 TABLETS BY MOUTH 2 TIMES DAILY. 120 tablet 1  . traMADol (ULTRAM) 50 MG tablet Take 50 mg by mouth every 6 (six) hours as needed for moderate pain.   0  . valACYclovir (VALTREX) 1000 MG tablet Take 1,000 mg by mouth daily.   3  . baclofen (LIORESAL) 10 MG tablet Take 1 tablet (10 mg total) by mouth 3 (three) times daily. As needed for muscle spasm 50 tablet 0  . meclizine (ANTIVERT) 25 MG tablet Take 1 tablet (25 mg total) by mouth 3 (three) times daily as needed for dizziness. 30 tablet 3    No Known Allergies    Review of Systems negative except from HPI and  PMH  Physical Exam BP 120/80 mmHg  Pulse 70  Temp(Src) 98 F (36.7 C) (Oral)  Resp 18  Ht 6\' 1"  (1.854 m)  Wt 185 lb (83.915 kg)  BMI 24.41 kg/m2  SpO2 98% Well developed and well nourished in no acute distress HENT normal E scleral and icterus clear Neck Suppled full Clear to ausculation  *Regular rate and rhythm, no murmurs gallops or rub No clubbing cyanosis  Edema Alert and oriented, grossly normal motor and sensory function Skin Warm and Dry  ECG sdinus  Assessment and  Plan  Cryptogenic Stroke  HTN    For linq recroder  Risks and benfits reviewed

## 2015-05-08 ENCOUNTER — Ambulatory Visit: Payer: 59 | Admitting: Family Medicine

## 2015-05-08 DIAGNOSIS — Z029 Encounter for administrative examinations, unspecified: Secondary | ICD-10-CM

## 2015-05-09 ENCOUNTER — Ambulatory Visit: Payer: 59 | Admitting: Family Medicine

## 2015-05-09 ENCOUNTER — Other Ambulatory Visit: Payer: Self-pay | Admitting: Family Medicine

## 2015-05-09 MED FILL — metFORMIN HCL 500 MG TABS: 500 | 90 days supply | Qty: 360 | Fill #0

## 2015-05-12 ENCOUNTER — Ambulatory Visit: Payer: 59

## 2015-05-13 ENCOUNTER — Ambulatory Visit (HOSPITAL_COMMUNITY): Payer: 59 | Attending: Orthopedic Surgery | Admitting: Physical Therapy

## 2015-05-13 ENCOUNTER — Ambulatory Visit (HOSPITAL_COMMUNITY): Payer: 59 | Admitting: Speech Pathology

## 2015-05-13 ENCOUNTER — Telehealth (HOSPITAL_COMMUNITY): Payer: Self-pay | Admitting: Physical Therapy

## 2015-05-13 DIAGNOSIS — R2681 Unsteadiness on feet: Secondary | ICD-10-CM | POA: Insufficient documentation

## 2015-05-13 DIAGNOSIS — R269 Unspecified abnormalities of gait and mobility: Secondary | ICD-10-CM | POA: Insufficient documentation

## 2015-05-13 DIAGNOSIS — R262 Difficulty in walking, not elsewhere classified: Secondary | ICD-10-CM | POA: Insufficient documentation

## 2015-05-13 DIAGNOSIS — R41841 Cognitive communication deficit: Secondary | ICD-10-CM | POA: Insufficient documentation

## 2015-05-13 DIAGNOSIS — R29898 Other symptoms and signs involving the musculoskeletal system: Secondary | ICD-10-CM | POA: Insufficient documentation

## 2015-05-13 DIAGNOSIS — Z9181 History of falling: Secondary | ICD-10-CM | POA: Insufficient documentation

## 2015-05-13 DIAGNOSIS — R471 Dysarthria and anarthria: Secondary | ICD-10-CM | POA: Insufficient documentation

## 2015-05-13 NOTE — Telephone Encounter (Signed)
Patient did not show for his appointment today. Called and left message detailing time and date of next PT appointment; also reminded of Speech Therapy appointment today at [place], DPT [phone]

## 2015-05-14 ENCOUNTER — Encounter: Payer: Self-pay | Admitting: Cardiology

## 2015-05-14 ENCOUNTER — Telehealth: Payer: Self-pay | Admitting: Internal Medicine

## 2015-05-14 NOTE — Telephone Encounter (Signed)
New Message   Pt wife wants to change his appt for his wound check but she wants to speak to the rn to make sure that it is save this is her second time rescheduling the appt  i educated her that i cant make that decision, and that we should consult the rn

## 2015-05-14 NOTE — Telephone Encounter (Signed)
Will r/s wound check appt to 1/13 @ 1500.

## 2015-05-15 ENCOUNTER — Ambulatory Visit: Payer: 59

## 2015-05-15 ENCOUNTER — Ambulatory Visit (HOSPITAL_COMMUNITY): Payer: 59

## 2015-05-15 ENCOUNTER — Encounter: Payer: Self-pay | Admitting: Family Medicine

## 2015-05-15 ENCOUNTER — Ambulatory Visit (INDEPENDENT_AMBULATORY_CARE_PROVIDER_SITE_OTHER): Payer: 59 | Admitting: Family Medicine

## 2015-05-15 VITALS — BP 128/80 | Ht 73.0 in | Wt 187.0 lb

## 2015-05-15 DIAGNOSIS — I6340 Cerebral infarction due to embolism of unspecified cerebral artery: Secondary | ICD-10-CM | POA: Diagnosis not present

## 2015-05-15 DIAGNOSIS — R29898 Other symptoms and signs involving the musculoskeletal system: Secondary | ICD-10-CM | POA: Diagnosis not present

## 2015-05-15 DIAGNOSIS — R269 Unspecified abnormalities of gait and mobility: Secondary | ICD-10-CM

## 2015-05-15 DIAGNOSIS — G4733 Obstructive sleep apnea (adult) (pediatric): Secondary | ICD-10-CM

## 2015-05-15 DIAGNOSIS — E1142 Type 2 diabetes mellitus with diabetic polyneuropathy: Secondary | ICD-10-CM | POA: Diagnosis not present

## 2015-05-15 DIAGNOSIS — R2681 Unsteadiness on feet: Secondary | ICD-10-CM | POA: Diagnosis not present

## 2015-05-15 DIAGNOSIS — Z9181 History of falling: Secondary | ICD-10-CM | POA: Diagnosis not present

## 2015-05-15 DIAGNOSIS — E785 Hyperlipidemia, unspecified: Secondary | ICD-10-CM | POA: Diagnosis not present

## 2015-05-15 DIAGNOSIS — R41841 Cognitive communication deficit: Secondary | ICD-10-CM | POA: Diagnosis not present

## 2015-05-15 DIAGNOSIS — E1159 Type 2 diabetes mellitus with other circulatory complications: Secondary | ICD-10-CM | POA: Diagnosis not present

## 2015-05-15 DIAGNOSIS — R262 Difficulty in walking, not elsewhere classified: Secondary | ICD-10-CM | POA: Diagnosis not present

## 2015-05-15 DIAGNOSIS — I10 Essential (primary) hypertension: Secondary | ICD-10-CM

## 2015-05-15 DIAGNOSIS — R471 Dysarthria and anarthria: Secondary | ICD-10-CM | POA: Diagnosis not present

## 2015-05-15 MED ORDER — TRAMADOL HCL 50 MG PO TABS: 50.0000 mg | ORAL_TABLET | Freq: Three times a day (TID) | ORAL | Status: DC | PRN

## 2015-05-15 NOTE — Therapy (Signed)
[place], [place], [address] Phone: [phone]   Fax:  [phone]  Physical Therapy Treatment  Patient Details  Name: Travis [name] MRN: [id number] Date of Birth: 03-Aug-1960 Referring Provider: [name] MD   Encounter Date: 05/15/2015      PT End of Session - 05/15/15 1529    Visit Number 10   Number of Visits 18   Date for PT Re-Evaluation 05/21/15   Authorization Type UMR   Authorization Time Period 04/23/15 to 06/24/15   PT Start Time 1525   PT Stop Time 1604   PT Time Calculation (min) 39 min   Equipment Utilized During Treatment Gait belt   Activity Tolerance Patient tolerated treatment well   Behavior During Therapy [facility] for tasks assessed/performed      Past Medical History  Diagnosis Date  . Night sweats     every once in a while  . Hyperlipidemia   . Diabetes mellitus   . Hernia, inguinal   . Dizziness   . Chronic headaches   . Arthritis   . Neuropathy of lower extremity   . Diabetic peripheral neuropathy ([place]) 03/28/2013  . Diverticulosis   . Hypertension   . Complication of anesthesia     bleed after last shoulder-had to stay overnight  . Impingement syndrome of left shoulder 08/02/2014  . 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 Multi-infarct state ([place]) 10/14/2014  . Depression   . Anxiety   . Stroke [facility])     Past Surgical History  Procedure Laterality Date  . Inguinal hernia repair Bilateral   . Cervical disc surgery    . Wrist surgery Right     fusion  . Shoulder arthroscopy Right     1  . Knee arthroscopy Right   . Colon surgery  2003    1/3 removed for diverticulitis  . Colonoscopy    . Umbilical hernia repair      with other hernia repair with mesh  . Shoulder arthroscopy Left 08/02/2014    Procedure: LEFT SHOULDER SCOPE DEBRIDEMENT/ACROMIOPLASTY;  Surgeon: [name], MD;  Location: [facility];  Service: Orthopedics;  Laterality: Left;   ANESTHESIA: GENERAL, PRE/POST OP SCALENE  . Tee without cardioversion N/A 03/03/2015    Procedure: TRANSESOPHAGEAL ECHOCARDIOGRAM (TEE);  Surgeon: [name], MD;  [facility];  Service: Cardiology;  Laterality: N/A;  . Ep implantable device N/A 05/01/2015    Procedure: Loop Recorder Insertion;  Surgeon: [name], MD;  Location: [place] [facility];  Service: Cardiovascular;  Laterality: N/A;    There were no vitals filed for this visit.  Visit Diagnosis:  Weakness of right leg  Abnormality of gait  Risk for falls  Difficulty walking  Unsteadiness      Subjective Assessment - 05/15/15 1519    Subjective Pt stated Lt hip pain, Rt wrist and Rt knee pain scale 5/10   Pertinent History Patient had been seen this year for vertigo treatments however then had cerebellar stroke, now being seen for this at skilled PT services.    Currently in Pain? Yes   Pain Score 5    Pain Location Leg   Pain Orientation Right   Pain Descriptors / Indicators Aching;Sore             [facility] Adult PT Treatment/Exercise - 05/15/15 0001    Knee/Hip Exercises: Standing   Rocker Board 2 minutes   Rocker Board Limitations R/L  and A/P no HHA   Gait Training Dynamic gait index activities x 23 min including 6 and 12 in hurdles, balance beam and weaving between cones.  Crowded area with people and cones.  569ft carrying glass of water.     Other Standing Knee Exercises Sidestepping and Retro gait.               PT Short Term Goals - 04/23/15 1627    PT SHORT TERM GOAL #1   Title Pt will be independent with HEP.   Baseline 04/04/2015:  Reports compliance daily   Time 3   Period Weeks   Status Achieved   PT SHORT TERM GOAL #2   Title Increase LE strength to improve five time sit to stand to 12 seconds or less.    Baseline 04/04/2015 5 STS 10.13   Time 3   Period Weeks   Status Achieved   PT SHORT TERM GOAL #3   Title Decrease fall risk evidenced by TUG time of 15  seconds or less.   Baseline 06/04/2014: TUG with SPC 9.06 seconds   Time 3   Period Weeks   Status New   PT SHORT TERM GOAL #4   Title Improve gait speed evidenced by 475 ft or more ambulated during 3 minute walk test.    Time 3   Period Weeks   Status Achieved   PT SHORT TERM GOAL #5   Title Patient to demonstrate ability to safely ambulate with new hurricane (currently in process of being ordered) in order to demonstrate good safety and minimal fall risk in community    Time 3   Period Weeks   Status New   Additional Short Term Goals   Additional Short Term Goals Yes   PT SHORT TERM GOAL #6   Title Patient will demonstrate minimal unsteadiness when ambulating over uneven surfaces with and without hurricane, good foot clearance, and minimal fall risk in order to enhance mobility at home and in community    Time 3   Period Weeks   Status New           PT Long Term Goals - 04/23/15 1704    PT LONG TERM GOAL #1   Title Pt to be independent with advanced HEP.   Time 6   Period Weeks   Status On-going   PT LONG TERM GOAL #2   Title Improve RLE strength to 4/5 or greater to improve gait mechanics and functional mobility.   Time 6   Period Weeks   Status Achieved   PT LONG TERM GOAL #3   Title Improve BLE strength and power evidenced by five time sit to stand time of 10 seconds or less.   Baseline 04/04/2015 5 STS 8.34 seconds no HHA   Time 6   Period Weeks   Status Achieved   PT LONG TERM GOAL #4   Title Decrease fall risk evidenced by 13 seconds or less on TUG.    Baseline 04/04/2015: TUG 09.06 with SPC   Time 6   Period Weeks   Status Achieved   PT LONG TERM GOAL #5   Title Pt will ambulate 1,000 feet or greater during 6MWT using LRAD or no AD to demonstrate improved gait speed and to return pt to community ambulation.    Baseline 04/04/2015: 6 minute 1,243 feet with SPC   Time 6   Period Weeks   Status Achieved   Additional Long Term Goals   Additional Long  Term Goals Yes   PT LONG TERM GOAL #6   Title Patient to be able to perform variety of physical multitasking activities during dynamic gait with no assistive device and while responding to randomized verbal stimuli with minimal unsteadiness and in forwards and backwards directions, also with no assistive device in order to reduce overall fall risk and optimize function in dynamic community envirionment    Time 6   Period Weeks   Status New   PT LONG TERM GOAL #7   Title Patient to report that he has been successfully able to navigate crowded store with minimal challenge or difficulty with spouse and LRAD, will also demonstrate ability to demonstrate correct safe response to random dynamic stimuli and perturbations with minimal instability or LOB in clinic in order to evidence ability to safely interact with and participate in dynamic community envirionments    Time 6   Period Weeks   Status New   PT LONG TERM GOAL #8   Title Patient to report that he has successfully been able to walk at least 1/2 mile with only minimal to moderate fatigue afterwards in order to address his reduced functional activity tolerance, improve abiltiy to participate in community based tasks, and to reduce overall fatigue releated fall risk    Time 6   Period Weeks   Status New               Plan - 05/15/15 1628    Clinical Impression Statement Reviewed new goals following reassessment last session, copy of reassessment given to pt.  Session focus on improving gait mechanics on dynamic surfaces, carrying conversation without reducing gait speed, navigation thorugh crowds and carrying objects safely.  Noted reduction in speed with head turns, conversation and obstacles and min assistance required for LOB episodes and cueing to improve gait mechanics.     PT Next Visit Plan focus on advanced functional tasks based around multitasking, unsteady surfaces, endurance, navigation through crowds/tight environments, safe  use of hurricane         Problem List Patient Active Problem List   Diagnosis Date Noted  . OSA (obstructive sleep apnea) 04/18/2015  . 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 artery ([place]) 04/18/2015  . Obstructive sleep apnea 03/19/2015  . Cerebellar stroke ([place]) 02/28/2015  . Snoring 01/23/2015  . Degeneration of cervical intervertebral disc 01/23/2015  . Essential hypertension 12/14/2014  . Type 2 diabetes mellitus with other circulatory complications ([place]) [id number]  . HLD (hyperlipidemia) 12/14/2014  . Neck pain 12/14/2014  . Cerebral infarction, chronic   . Dizzy   . Vertigo 10/14/2014  . Multi-infarct state ([place]) 10/14/2014  . CVA (cerebral infarction) 10/14/2014  . Impingement syndrome of left shoulder 08/02/2014  . 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 Abdominal pain 06/13/2014  . Rectal bleeding 03/13/2014  . Diabetes type 2, controlled ([place]) 02/18/2014  . Hyperlipidemia 02/18/2014  . Diabetic peripheral neuropathy ([place]) 03/28/2013  . Irreducible incisional hernia 11/11/2010   Ihor Austin, [place]; CBIS [phone]' Aldona Lento 05/15/2015, 4:37 PM  [facility] [address] [place], [place], [address] Phone: [phone]   Fax:  [phone]  Name: Travis [name] MRN: [id number] Date of Birth: 1961/02/23

## 2015-05-15 NOTE — Progress Notes (Signed)
Subjective:    Patient ID: Travis [name], male    DOB: October 30, 1960, 55 y.o.   MRN: [id number]  Diabetes He presents for his follow-up diabetic visit. He has type 2 diabetes mellitus. There are no hypoglycemic associated symptoms. Pertinent negatives for hypoglycemia include no confusion. There are no diabetic associated symptoms. Pertinent negatives for diabetes include no chest pain, no fatigue, no polydipsia, no polyphagia and no weakness. There are no hypoglycemic complications. There are no diabetic complications. There are no known risk factors for coronary artery disease. Current diabetic treatment includes oral agent (dual therapy). He is compliant with treatment all of the time.   Last A1C was 2 months ago.  Patient states that he has to schedule appointment for diabetic eye exam.  Patient states that he has right knee andright wrist pain. This is a reoccurring issue. He describes as intermittent soreness and discomfort in his wrist and his knee.  Patient wants handicap placard form. Long discussion held with the patient today he is been unable to do much activity around the house. Has difficult time thinking difficult time remembering unable to go back to work. He does take his medicines on a regular basis and watches his diet new line he denies being depressed Denies any chest tightness pressure pain shortness of breath No new stroke symptoms Diagnosed with sleep apnea but not using CPAP sheen.     Review of Systems  Constitutional: Negative for activity change, appetite change and fatigue.  HENT: Negative for congestion.   Respiratory: Negative for cough.   Cardiovascular: Negative for chest pain.  Gastrointestinal: Negative for abdominal pain.  Endocrine: Negative for polydipsia and polyphagia.  Neurological: Negative for weakness.  Psychiatric/Behavioral: Negative for confusion.       Objective:   Physical Exam  Constitutional: He appears well-nourished. No  distress.  Cardiovascular: Normal rate, regular rhythm and normal heart sounds.   No murmur heard. Pulmonary/Chest: Effort normal and breath sounds normal. No respiratory distress.  Musculoskeletal: He exhibits no edema.  Lymphadenopathy:    He has no cervical adenopathy.  Neurological: He is alert.  Psychiatric: His behavior is normal.  Vitals reviewed.    25 minutes was spent with the patient. Greater than half the time was spent in discussion and answering questions and counseling regarding the issues that the patient came in for today.      Assessment & Plan:  1. Essential hypertension Blood pressure control overall good continue current measures. Watch diet closely stay physically active - Lipid panel - Basic metabolic panel - Hepatic function panel - Hemoglobin A1c - CBC with Differential/Platelet  2. 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 artery ([place]) History of stroke he is going through physical therapy currently. This patient long-term is disabled because of this problem. He is not be able to work. This stroke affected his cognitive thinking. He has difficulty talking difficulty selecting words and difficulty remembering. In addition to this he has a difficult time understanding concepts. It is also affected the right side of his body with discoordination and weakness. Given that this is over 3 months ago it is unlikely that he will ever recover this function again - Lipid panel - Basic metabolic panel - Hepatic function panel - Hemoglobin A1c - CBC with Differential/Platelet  3. Obstructive sleep apnea This patient does have sleep apnea he was encouraged to use a CPAP machine on a regular basis. We will help acquire this machine for him. The testing was done through  the neurologist. - Lipid panel - Basic metabolic panel - Hepatic function panel - Hemoglobin A1c - CBC with Differential/Platelet  4. Diabetic peripheral neuropathy (HCC) His  diabetes has caused peripheral neuropathy it seems to be stable currently he is encouraged to check his feet daily - Lipid panel - Basic metabolic panel - Hepatic function panel - Hemoglobin A1c - CBC with Differential/Platelet  5. Type 2 diabetes mellitus with other circulatory complications (HCC) His [id number] under decent control but he will need to repeat it again in about a month's time watch diet closely stay on all medicines. - Lipid panel - Basic metabolic panel - Hepatic function panel - Hemoglobin A1c - CBC with Differential/Platelet  6. Hyperlipidemia Watch fats in diet continue current medications keep LDL under 70 - Lipid panel - Basic metabolic panel - Hepatic function panel - Hemoglobin A1c - CBC with Differential/Platelet

## 2015-05-16 ENCOUNTER — Encounter: Payer: Self-pay | Admitting: Internal Medicine

## 2015-05-16 ENCOUNTER — Ambulatory Visit (INDEPENDENT_AMBULATORY_CARE_PROVIDER_SITE_OTHER): Payer: 59 | Admitting: *Deleted

## 2015-05-16 DIAGNOSIS — I63443 Cerebral infarction due to embolism of bilateral cerebellar arteries: Secondary | ICD-10-CM

## 2015-05-19 ENCOUNTER — Telehealth: Payer: Self-pay | Admitting: Family Medicine

## 2015-05-19 DIAGNOSIS — Z0271 Encounter for disability determination: Secondary | ICD-10-CM

## 2015-05-19 LAB — CUP PACEART INCLINIC DEVICE CHECK: MDC IDC SESS DTM: 20170113202031

## 2015-05-19 NOTE — Progress Notes (Signed)
Wound check in clinic s/p ILR implant. Wound well healed without redness or edema.   Normal ILR device function. Battery status is good. 0 symptom episodes, 0 tachy episodes, 0 pause episodes, 0 brady episodes. 0 AF episodes. Monthly summary reports and ROV with SK in 12 months.

## 2015-05-19 NOTE — Telephone Encounter (Signed)
The patient has sleep apnea. When he was in for the previous visit we discussed his sleep study. The neurologist recommended a toe titration CPAP machine. It was felt that his sleep apnea needed to be treated because of his underlying history of daytime somnolence , mild sleep apnea and history of lacunar strokes - please forward necessary information to family or [facility] -their choice ( please note that if this does get denied by the insurance company it will be necessary for the patient and wife to go through their neurologist to get this approved since they were the specialty's group that did the sleep study thank you)

## 2015-05-20 ENCOUNTER — Other Ambulatory Visit: Payer: Self-pay | Admitting: Neurology

## 2015-05-20 ENCOUNTER — Telehealth: Payer: Self-pay | Admitting: Neurology

## 2015-05-20 ENCOUNTER — Telehealth: Payer: Self-pay | Admitting: Family Medicine

## 2015-05-20 ENCOUNTER — Ambulatory Visit (HOSPITAL_COMMUNITY): Payer: 59 | Admitting: Physical Therapy

## 2015-05-20 DIAGNOSIS — R262 Difficulty in walking, not elsewhere classified: Secondary | ICD-10-CM

## 2015-05-20 DIAGNOSIS — R269 Unspecified abnormalities of gait and mobility: Secondary | ICD-10-CM

## 2015-05-20 DIAGNOSIS — R41841 Cognitive communication deficit: Secondary | ICD-10-CM | POA: Diagnosis not present

## 2015-05-20 DIAGNOSIS — Z9181 History of falling: Secondary | ICD-10-CM | POA: Diagnosis not present

## 2015-05-20 DIAGNOSIS — R2681 Unsteadiness on feet: Secondary | ICD-10-CM | POA: Diagnosis not present

## 2015-05-20 DIAGNOSIS — R29898 Other symptoms and signs involving the musculoskeletal system: Secondary | ICD-10-CM

## 2015-05-20 DIAGNOSIS — R471 Dysarthria and anarthria: Secondary | ICD-10-CM | POA: Diagnosis not present

## 2015-05-20 DIAGNOSIS — G44009 Cluster headache syndrome, unspecified, not intractable: Secondary | ICD-10-CM

## 2015-05-20 MED FILL — TOPIRAMATE 25 MG TABLET: 25 | 30 days supply | Qty: 120 | Fill #0

## 2015-05-20 MED FILL — LISINOPRIL 2.5 MG TABLET: 2.5 | 90 days supply | Qty: 90 | Fill #1

## 2015-05-20 NOTE — Telephone Encounter (Signed)
Topamax refill done escrbed to pharmacy
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
## 2015-05-20 NOTE — Telephone Encounter (Signed)
Pt is needing a refill on his [facility]amax [facility].

## 2015-05-20 NOTE — Telephone Encounter (Signed)
Pt's wife called requesting refill for topiramate (TOPAMAX) 25 MG tablet . She said the pharmacy has been contacted but pt is out of medication.

## 2015-05-20 NOTE — Telephone Encounter (Signed)
The pharmacy sent the refill request to neurologist and it is awaiting there approval in EPIC. Wife notified.

## 2015-05-20 NOTE — Telephone Encounter (Signed)
im ok with refilling it for 6 months

## 2015-05-20 NOTE — Telephone Encounter (Signed)
This has not been filled by us-last filled by neurologist

## 2015-05-22 ENCOUNTER — Ambulatory Visit (HOSPITAL_COMMUNITY): Payer: 59 | Admitting: Speech Pathology

## 2015-05-22 ENCOUNTER — Ambulatory Visit (HOSPITAL_COMMUNITY): Payer: 59 | Admitting: Physical Therapy

## 2015-05-22 DIAGNOSIS — R262 Difficulty in walking, not elsewhere classified: Secondary | ICD-10-CM | POA: Diagnosis not present

## 2015-05-22 DIAGNOSIS — Z9181 History of falling: Secondary | ICD-10-CM | POA: Diagnosis not present

## 2015-05-22 DIAGNOSIS — R41841 Cognitive communication deficit: Secondary | ICD-10-CM

## 2015-05-22 DIAGNOSIS — R471 Dysarthria and anarthria: Secondary | ICD-10-CM | POA: Diagnosis not present

## 2015-05-22 DIAGNOSIS — R29898 Other symptoms and signs involving the musculoskeletal system: Secondary | ICD-10-CM | POA: Diagnosis not present

## 2015-05-22 DIAGNOSIS — R269 Unspecified abnormalities of gait and mobility: Secondary | ICD-10-CM | POA: Diagnosis not present

## 2015-05-22 DIAGNOSIS — R2681 Unsteadiness on feet: Secondary | ICD-10-CM | POA: Diagnosis not present
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
## 2015-05-23 ENCOUNTER — Encounter: Payer: Self-pay | Admitting: Cardiology

## 2015-05-27 ENCOUNTER — Ambulatory Visit (HOSPITAL_COMMUNITY): Payer: 59

## 2015-05-27 ENCOUNTER — Ambulatory Visit (HOSPITAL_COMMUNITY): Payer: 59 | Admitting: Speech Pathology

## 2015-05-27 ENCOUNTER — Telehealth (HOSPITAL_COMMUNITY): Payer: Self-pay | Admitting: Speech Pathology

## 2015-05-27 NOTE — Telephone Encounter (Signed)
He can not afford to get here today will reschedule for Feb 14. NF 05/27/15

## 2015-05-29 ENCOUNTER — Telehealth (HOSPITAL_COMMUNITY): Payer: Self-pay | Admitting: Physical Therapy

## 2015-05-29 ENCOUNTER — Ambulatory Visit (HOSPITAL_COMMUNITY): Payer: 59 | Admitting: Physical Therapy

## 2015-05-29 MED FILL — MELOXICAM 15 MG TABLET: 15 | 30 days supply | Qty: 30 | Fill #2
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
## 2015-05-30 ENCOUNTER — Telehealth: Payer: Self-pay | Admitting: Internal Medicine

## 2015-05-30 NOTE — Telephone Encounter (Signed)
New Message  Pt wife calling to speak w/ Device- received letter taht device has not been checked. Pt had implant on 04/30/16. Please call back and discuss.

## 2015-05-30 NOTE — Telephone Encounter (Signed)
Spoke with pt's wife. Made her aware that when she gets a letter we have not received a transmission in 7 days and she will need to manually send one. I walked her through the transmission process. Reviewed last transmission- no episodes. She verbalizes understanding.

## 2015-06-02 ENCOUNTER — Ambulatory Visit (INDEPENDENT_AMBULATORY_CARE_PROVIDER_SITE_OTHER): Payer: 59 | Admitting: *Deleted

## 2015-06-02 DIAGNOSIS — I63443 Cerebral infarction due to embolism of bilateral cerebellar arteries: Secondary | ICD-10-CM

## 2015-06-03 NOTE — Progress Notes (Signed)
[facility] Summary Report / Loop Recorder 

## 2015-06-06 MED FILL — ATORVASTATIN 20 MG TABLET: 20 | 90 days supply | Qty: 90 | Fill #1

## 2015-06-10 NOTE — Telephone Encounter (Signed)
Spoke with pt's wife, OK to order through [facility] (already gets services through them) Order & documentation faxed to [facility] 06/09/15

## 2015-06-17 ENCOUNTER — Encounter (HOSPITAL_COMMUNITY): Payer: Self-pay | Admitting: Speech Pathology

## 2015-06-17 ENCOUNTER — Ambulatory Visit (HOSPITAL_COMMUNITY): Payer: 59 | Attending: Orthopedic Surgery | Admitting: Speech Pathology

## 2015-06-17 DIAGNOSIS — R471 Dysarthria and anarthria: Secondary | ICD-10-CM | POA: Insufficient documentation

## 2015-06-17 NOTE — Therapy (Signed)
[place], [place], [address] Phone: [phone]   Fax:  [phone]  Speech Language Pathology Treatment  Patient Details  Name: Travis [name] MRN: [id number] Date of Birth: 01-19-1961 Referring Provider: Dr. [name]  Encounter Date: 06/17/2015      End of Session - 06/17/15 2003    Visit Number 12   Number of Visits 12   Date for SLP Re-Evaluation 05/29/15   Authorization Type UMR   SLP Start Time 1615   SLP Stop Time  1715   SLP Time Calculation (min) 60 min   Activity Tolerance Patient tolerated treatment well      Past Medical History  Diagnosis Date  . Night sweats     every once in a while  . Hyperlipidemia   . Diabetes mellitus   . Hernia, inguinal   . Dizziness   . Chronic headaches   . Arthritis   . Neuropathy of lower extremity   . Diabetic peripheral neuropathy ([place]) 03/28/2013  . Diverticulosis   . Hypertension   . Complication of anesthesia     bleed after last shoulder-had to stay overnight  . Impingement syndrome of left shoulder 08/02/2014  . 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 Multi-infarct state ([place]) 10/14/2014  . Depression   . Anxiety   . Stroke [facility])     Past Surgical History  Procedure Laterality Date  . Inguinal hernia repair Bilateral   . Cervical disc surgery    . Wrist surgery Right     fusion  . Shoulder arthroscopy Right     1  . Knee arthroscopy Right   . Colon surgery  2003    1/3 removed for diverticulitis  . Colonoscopy    . Umbilical hernia repair      with other hernia repair with mesh  . Shoulder arthroscopy Left 08/02/2014    Procedure: LEFT SHOULDER SCOPE DEBRIDEMENT/ACROMIOPLASTY;  Surgeon: [name], MD;  Location: [facility];  Service: Orthopedics;  Laterality: Left;  ANESTHESIA: GENERAL, PRE/POST OP SCALENE  . Tee without cardioversion N/A 03/03/2015    Procedure: TRANSESOPHAGEAL ECHOCARDIOGRAM (TEE);   Surgeon: [name], MD;  [facility];  Service: Cardiology;  Laterality: N/A;  . Ep implantable device N/A 05/01/2015    Procedure: Loop Recorder Insertion;  Surgeon: [name], MD;  Location: [facility];  Service: Cardiovascular;  Laterality: N/A;    There were no vitals filed for this visit.  Visit Diagnosis: Dysarthria      Subjective Assessment - 06/17/15 2000    Subjective "My wife thinks I need more speech therapy."   Currently in Pain? No/denies               ADULT SLP TREATMENT - 06/17/15 2000    General Information   Behavior/Cognition Alert;Cooperative;Pleasant mood   Patient Positioning Upright in chair   Oral care provided N/A   HPI 55 yo male with past medical history of CVA who presents for cognitive linguistic therapy.   Treatment Provided   Treatment provided Cognitive-Linquistic   Pain Assessment   Pain Assessment No/denies pain   Cognitive-Linquistic Treatment   Treatment focused on Patient/family/caregiver education;Dysarthria   Skilled Treatment Ataxia dysarthria intervention, speech intelligibility and naturalness straegies, education   Assessment / Recommendations / Plan   Plan Discharge SLP treatment due to (comment);All goals met   Progression Toward Goals   Progression toward goals Goals met, education  completed, patient discharged from [facility] - 06/17/15 2002    Education provided Yes   Education Details Speech intelligibility strategies, ataxic dysarthria   Person(s) Educated Patient   Methods Explanation;Verbal cues;Handout   Comprehension Verbalized understanding          SLP Short Term Goals - 06/17/15 2004    SLP SHORT TERM GOAL #1   Title Pt will implement memory strategies in functional tasks for 90% acc with min assist   Time 6   Period Weeks   Status Partially Met   SLP SHORT TERM GOAL #2   Title Pt will complete mod level thought organization and deductive  reasoning tasks with 90% acc with use of strategies and min assist.    Time 6   Period Weeks   Status Partially Met   SLP SHORT TERM GOAL #3   Title Pt will identify appropriate strategies to use in hypothetical everyday problem solving scenarios with min assist.    Time 6   Period Weeks   Status Achieved   SLP SHORT TERM GOAL #4   Title Pt will implement speech intelligibility strategies with 95% acc during conversations to increase naturalness and intelligibility of speech.   Baseline 75% acc   Time 8   Period Weeks   Status Achieved          SLP Long Term Goals - 06/17/15 2004    SLP LONG TERM GOAL #1   Title Pt will communicate complex thoughts and feelings to [facility] with use of strategies. Pt will also be independent with use of memory and thought organization strategies.   Baseline min impairment   Time 3   Period Months   Status Achieved          Plan - 06/17/15 2003    Clinical Impression Statement Pt returned for final SLP session after not having been seen since 05/22/2015. Pt rescheduled his final appointment several times. Pt reports that he continues to be bothered by his speech and reports that his wife feels like he needs more therapy. Previous goals were targeting ataxic dysarthria and cognition. Pt appears most motivated to work on his speech over cognition. SLP reinforced speech intelligibility strategies (most effective for him are decreasing rate and over-articulating). Pt also reports that he is trying to be more cognizant of swallowing more frequently.  Pt able to demonstrate more precise oral motor movements, however reduced naturalness noted. Recent neurology visit in December notes pressed speech pattern which is often seen in ataxic dysarthria. SLP explained that the goal in speech therapy is to emphasize accuracy/intelligibility over naturalness initially. At its highest level, we see an integration of the two. Mr. Clear does lack some naturalness to his  speech, however his speech nears 100% intelligibility now with use of strategies. Pt reports fluctuation in speech abilities which is likely impacted by fatigue and possibly medication effect (pt also reports an occasional beer). SLP encouraged pt to practice reading/speaking aloud at home (was given HEP of tongue twisters) and tape recording for feedback to help fine tune naturalness of speech. Pt made good progress toward goals with some fully met. No further outpatient SLP services indicated at this time, as pt can continue to work on HEP after discharge.    Treatment/Interventions Functional tasks;SLP instruction and feedback;Compensatory strategies;Patient/family education;Internal/external aids;Cognitive reorganization;Cueing hierarchy   Potential to Achieve Goals Good   Potential Considerations Co-morbidities   SLP Home Exercise Plan Pt  will be independent in HEP as assigned to facilitate carryover of treatment strategies and techniques in home and community environment   Consulted and Agree with Plan of Care Patient        Problem List Patient Active Problem List   Diagnosis Date Noted  . OSA (obstructive sleep apnea) 04/18/2015  . 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 artery ([place]) 04/18/2015  . Obstructive sleep apnea 03/19/2015  . Cerebellar stroke ([place]) 02/28/2015  . Snoring 01/23/2015  . Degeneration of cervical intervertebral disc 01/23/2015  . Essential hypertension 12/14/2014  . Type 2 diabetes mellitus with other circulatory complications ([place]) 01/77/9390  . HLD (hyperlipidemia) 12/14/2014  . Neck pain 12/14/2014  . Cerebral infarction, chronic   . Dizzy   . Vertigo 10/14/2014  . Multi-infarct state ([place]) 10/14/2014  . CVA (cerebral infarction) 10/14/2014  . Impingement syndrome of left shoulder 08/02/2014  . 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 Abdominal pain 06/13/2014  . Rectal bleeding 03/13/2014  . Diabetes type 2, controlled  ([place]) 02/18/2014  . Hyperlipidemia 02/18/2014  . Diabetic peripheral neuropathy ([place]) 03/28/2013  . Irreducible incisional hernia 11/11/2010   SPEECH THERAPY DISCHARGE SUMMARY  Visits from Start of Care: 12  Current functional level related to goals / functional outcomes: Good progress toward all goals. See clinical impression above. Pt is now able to bring himself to speech therapy appointments, although often arrives late. He communicates complex thoughts and expressions with occasional cues for topic maintenance. 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   Remaining deficits: Mild cognitive deficits (contact guard assist for complex tasks); Mild ataxic dysarthria   Education / Equipment: Education regarding dysarthria and speech intelligibility strategies provided (HEP given); Memory and organization strategies reviewed  Plan: Patient agrees to discharge.  Patient goals were partially met. Patient is being discharged due to meeting the stated rehab goals.  ?????       Thank you,  Genene Churn, [place]  [facility] 06/17/2015, 8:05 PM  [place], [place], [address] Phone: [phone]   Fax:  [phone]   Name: MITSUGI SCHRADER MRN: [id number] Date of Birth: 1960-07-02

## 2015-06-20 ENCOUNTER — Encounter: Payer: Self-pay | Admitting: Cardiology

## 2015-06-24 MED FILL — CLOPIDOGREL 75 MG TABLET: 75 | 90 days supply | Qty: 90 | Fill #1

## 2015-06-24 MED FILL — FOLIC ACID 1 MG TABLET: 1 | 90 days supply | Qty: 90 | Fill #1

## 2015-06-24 MED FILL — TOPIRAMATE 25 MG TABLET: 25 | 30 days supply | Qty: 120 | Fill #1

## 2015-06-27 ENCOUNTER — Encounter: Payer: Self-pay | Admitting: Cardiology

## 2015-06-27 MED FILL — traMADol HCL 50 MG TABS: 50 | 12 days supply | Qty: 35 | Fill #0

## 2015-06-27 MED FILL — MELOXICAM 15 MG TABLET: 15 | 30 days supply | Qty: 30 | Fill #3

## 2015-06-30 ENCOUNTER — Ambulatory Visit (INDEPENDENT_AMBULATORY_CARE_PROVIDER_SITE_OTHER): Payer: 59 | Admitting: *Deleted

## 2015-06-30 DIAGNOSIS — I63443 Cerebral infarction due to embolism of bilateral cerebellar arteries: Secondary | ICD-10-CM

## 2015-06-30 LAB — CUP PACEART REMOTE DEVICE CHECK: Date Time Interrogation Session: 20170227120530

## 2015-06-30 NOTE — Progress Notes (Signed)
[facility] Summary Report / Loop Recorder 

## 2015-07-01 ENCOUNTER — Other Ambulatory Visit: Payer: Self-pay

## 2015-07-01 NOTE — Patient Outreach (Signed)
Risk risk screening/UMR: Placed call to home number. No answer Placed call to cell phone. No answer. Mailbox full. Unable to leave a voicemail.  Plan: will attempt outreach again.  Tomasa Rand, RN, BSN, CEN [facility] [employer] [phone]

## 2015-07-02 DIAGNOSIS — Z029 Encounter for administrative examinations, unspecified: Secondary | ICD-10-CM

## 2015-07-04 ENCOUNTER — Encounter: Payer: Self-pay | Admitting: Cardiology

## 2015-07-07 ENCOUNTER — Other Ambulatory Visit: Payer: Self-pay

## 2015-07-07 NOTE — Patient Outreach (Signed)
UMR high risk screening: Place call to patient.  Spoke with wife who reports that patient is doing well. States the patient is active with physical therapy. States that patient is taking advantage of pharmacy benefits. Reports patient understands how to manage his DM.   Wife denies any needs at this time.   Plan: I provided contact information for [facility] care management and encouraged wife and patient to call if needed. Will close case as no needs identified.  Tomasa Rand, RN, BSN, CEN [facility] [employer] [phone]

## 2015-07-08 NOTE — Progress Notes (Signed)
Carelink summary report received. Battery status OK. Normal device function. No new symptom episodes, tachy episodes, brady, or pause episodes. No new AF episodes. Monthly summary reports and ROV/PRN 

## 2015-07-11 ENCOUNTER — Other Ambulatory Visit: Payer: Self-pay | Admitting: *Deleted

## 2015-07-11 MED ORDER — PREGABALIN 100 MG PO CAPS: 75.0000 mg | ORAL_CAPSULE | Freq: Three times a day (TID) | ORAL | Status: DC

## 2015-07-11 MED FILL — LYRICA 100 MG CAPSULE: 100 | 90 days supply | Qty: 270 | Fill #0

## 2015-07-14 NOTE — Progress Notes (Signed)
Carelink summary report received. Battery status OK. Normal device function. No new symptom episodes, tachy episodes, brady, or pause episodes. No new AF episodes. Monthly summary reports and ROV/PRN 

## 2015-07-24 ENCOUNTER — Ambulatory Visit (INDEPENDENT_AMBULATORY_CARE_PROVIDER_SITE_OTHER): Payer: 59 | Admitting: Neurology

## 2015-07-24 ENCOUNTER — Encounter: Payer: Self-pay | Admitting: Neurology

## 2015-07-24 VITALS — BP 118/78 | HR 74 | Ht 73.0 in | Wt 183.6 lb

## 2015-07-24 DIAGNOSIS — I82532 Chronic embolism and thrombosis of left popliteal vein: Secondary | ICD-10-CM | POA: Insufficient documentation

## 2015-07-24 DIAGNOSIS — E785 Hyperlipidemia, unspecified: Secondary | ICD-10-CM

## 2015-07-24 DIAGNOSIS — E1159 Type 2 diabetes mellitus with other circulatory complications: Secondary | ICD-10-CM

## 2015-07-24 DIAGNOSIS — E1142 Type 2 diabetes mellitus with diabetic polyneuropathy: Secondary | ICD-10-CM | POA: Diagnosis not present

## 2015-07-24 DIAGNOSIS — Q211 Atrial septal defect: Secondary | ICD-10-CM | POA: Insufficient documentation

## 2015-07-24 DIAGNOSIS — G4733 Obstructive sleep apnea (adult) (pediatric): Secondary | ICD-10-CM | POA: Diagnosis not present

## 2015-07-24 DIAGNOSIS — Q2112 Patent foramen ovale: Secondary | ICD-10-CM | POA: Insufficient documentation

## 2015-07-24 DIAGNOSIS — I639 Cerebral infarction, unspecified: Secondary | ICD-10-CM

## 2015-07-24 DIAGNOSIS — I6340 Cerebral infarction due to embolism of unspecified cerebral artery: Secondary | ICD-10-CM | POA: Diagnosis not present

## 2015-07-24 DIAGNOSIS — I10 Essential (primary) hypertension: Secondary | ICD-10-CM

## 2015-07-24 MED FILL — TOPIRAMATE 25 MG TABLET: 25 | 30 days supply | Qty: 120 | Fill #2

## 2015-07-24 MED FILL — valACYclovir HCL 1 GM TABS: 1 | 90 days supply | Qty: 90 | Fill #3

## 2015-07-24 MED FILL — MELOXICAM 15 MG TABLET: 15 | 30 days supply | Qty: 30 | Fill #4
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
## 2015-07-24 NOTE — Patient Instructions (Addendum)
-   continue ASA and plavix and lipitor for stroke prevention  - continue home exercise and speech exercise - Follow up with your primary care physician for stroke risk factor modification. Recommend maintain blood pressure goal <130/80, diabetes with hemoglobin A1c goal below 6.5% and lipids with LDL cholesterol goal below 70 mg/dL.  - continue topamax to 50mg  bid for headache prevention - may consider medication for cognitive impairment.  - follow up in 6 months.

## 2015-07-28 ENCOUNTER — Encounter (HOSPITAL_COMMUNITY): Payer: Self-pay

## 2015-07-30 ENCOUNTER — Ambulatory Visit (INDEPENDENT_AMBULATORY_CARE_PROVIDER_SITE_OTHER): Payer: 59 | Admitting: *Deleted

## 2015-07-30 ENCOUNTER — Telehealth: Payer: Self-pay | Admitting: Cardiology

## 2015-07-30 DIAGNOSIS — I63443 Cerebral infarction due to embolism of bilateral cerebellar arteries: Secondary | ICD-10-CM | POA: Diagnosis not present

## 2015-07-30 NOTE — Progress Notes (Signed)
[facility] Summary Report / Loop Recorder 

## 2015-07-30 NOTE — Telephone Encounter (Signed)
LMOVM for pt requesting that he send a manual transmission b/c his home monitor has not updated in at least 14 days.

## 2015-07-30 NOTE — Telephone Encounter (Signed)
Error

## 2015-08-01 MED FILL — GLYBURIDE MICRO 6 MG TABLET: 6 | 90 days supply | Qty: 180 | Fill #1

## 2015-08-01 MED FILL — traMADol HCL 50 MG TABS: 50 | 12 days supply | Qty: 35 | Fill #1

## 2015-08-05 ENCOUNTER — Encounter: Payer: Self-pay | Admitting: Cardiology

## 2015-08-12 ENCOUNTER — Encounter: Payer: Self-pay | Admitting: Cardiology

## 2015-08-12 LAB — CUP PACEART REMOTE DEVICE CHECK: Date Time Interrogation Session: 20170128120519

## 2015-08-21 ENCOUNTER — Other Ambulatory Visit: Payer: Self-pay | Admitting: Family Medicine

## 2015-08-21 MED FILL — LISINOPRIL 2.5 MG TABLET: 2.5 | 90 days supply | Qty: 90 | Fill #0

## 2015-08-21 MED FILL — TOPIRAMATE 25 MG TABLET: 25 | 30 days supply | Qty: 120 | Fill #3

## 2015-08-21 MED FILL — MELOXICAM 15 MG TABLET: 15 | 30 days supply | Qty: 30 | Fill #5

## 2015-08-21 MED FILL — traMADol HCL 50 MG TABS: 50 | 12 days supply | Qty: 35 | Fill #2

## 2015-08-21 MED FILL — metFORMIN HCL 500 MG TABS: 500 | 90 days supply | Qty: 360 | Fill #0

## 2015-08-29 ENCOUNTER — Ambulatory Visit (INDEPENDENT_AMBULATORY_CARE_PROVIDER_SITE_OTHER): Payer: 59 | Admitting: *Deleted

## 2015-08-29 DIAGNOSIS — I63443 Cerebral infarction due to embolism of bilateral cerebellar arteries: Secondary | ICD-10-CM | POA: Diagnosis not present

## 2015-08-29 NOTE — Progress Notes (Signed)
[facility] Summary Report / Loop Recorder 

## 2015-09-08 DIAGNOSIS — Z0271 Encounter for disability determination: Secondary | ICD-10-CM

## 2015-09-10 ENCOUNTER — Other Ambulatory Visit: Payer: Self-pay | Admitting: Nurse Practitioner

## 2015-09-10 ENCOUNTER — Telehealth: Payer: Self-pay | Admitting: Family Medicine

## 2015-09-10 MED ORDER — ATORVASTATIN CALCIUM 20 MG PO TABS: 20.0000 mg | ORAL_TABLET | Freq: Every day | ORAL | Status: DC

## 2015-09-10 MED FILL — ATORVASTATIN 20 MG TABLET: 20 | 90 days supply | Qty: 90 | Fill #0

## 2015-09-10 NOTE — Telephone Encounter (Signed)
done

## 2015-09-10 NOTE — Telephone Encounter (Signed)
atorvastatin (LIPITOR) 20 MG tablet   Refill please, pt is out as of today   Out patient Pharm

## 2015-09-18 ENCOUNTER — Encounter: Payer: Self-pay | Admitting: Family Medicine

## 2015-09-18 ENCOUNTER — Ambulatory Visit (INDEPENDENT_AMBULATORY_CARE_PROVIDER_SITE_OTHER): Payer: 59 | Admitting: Family Medicine

## 2015-09-18 ENCOUNTER — Other Ambulatory Visit: Payer: Self-pay | Admitting: Family Medicine

## 2015-09-18 VITALS — BP 138/86 | Ht 73.0 in | Wt 183.0 lb

## 2015-09-18 DIAGNOSIS — L989 Disorder of the skin and subcutaneous tissue, unspecified: Secondary | ICD-10-CM

## 2015-09-18 DIAGNOSIS — I10 Essential (primary) hypertension: Secondary | ICD-10-CM | POA: Diagnosis not present

## 2015-09-18 DIAGNOSIS — E785 Hyperlipidemia, unspecified: Secondary | ICD-10-CM | POA: Diagnosis not present

## 2015-09-18 DIAGNOSIS — E119 Type 2 diabetes mellitus without complications: Secondary | ICD-10-CM

## 2015-09-18 DIAGNOSIS — Z7982 Long term (current) use of aspirin: Secondary | ICD-10-CM

## 2015-09-18 DIAGNOSIS — Z79899 Other long term (current) drug therapy: Secondary | ICD-10-CM | POA: Diagnosis not present

## 2015-09-18 LAB — POCT GLYCOSYLATED HEMOGLOBIN (HGB A1C): HEMOGLOBIN A1C: 5

## 2015-09-18 MED ORDER — TRAMADOL HCL 50 MG PO TABS: 50.0000 mg | ORAL_TABLET | Freq: Three times a day (TID) | ORAL | Status: DC | PRN

## 2015-09-18 MED ORDER — GLYBURIDE MICRONIZED 3 MG PO TABS: ORAL_TABLET | ORAL | Status: DC

## 2015-09-18 MED FILL — traMADol HCL 50 MG TABS: 50 | 12 days supply | Qty: 35 | Fill #0

## 2015-09-18 MED FILL — GLYBURIDE MICRO 3 MG TABLET: 3 | 30 days supply | Qty: 60 | Fill #0

## 2015-09-18 NOTE — Progress Notes (Addendum)
   Subjective:    Patient ID: Travis [name], male    DOB: 20-May-1960, 55 y.o.   MRN: [id number]  Diabetes He presents for his follow-up diabetic visit. He has type 2 diabetes mellitus. Hypoglycemia symptoms include dizziness. Pertinent negatives for hypoglycemia include no confusion. Pertinent negatives for diabetes include no chest pain, no fatigue, no polydipsia, no polyphagia and no weakness. He is following a diabetic diet. He participates in exercise daily. He does not see a podiatrist.Eye exam is not current (due for one now).  A1C today 5.0  Trouble with right arm and shoulder. Hard to swing gold club. Doing exercises that physical therapy told him to do.  Patient denies any reoccurrence of stroke symptoms He takes his medicine on a regular basis denies any particular troubles currently Does take his cholesterol medicine tries watch his diet Denies any low sugar spells. Numbness in right pointer finger. Started about 4 weeks ago.   Needs refill on tramadol and meclizine.    Review of Systems  Constitutional: Negative for activity change, appetite change and fatigue.  HENT: Negative for congestion.   Respiratory: Negative for cough.   Cardiovascular: Negative for chest pain.  Gastrointestinal: Negative for abdominal pain.  Endocrine: Negative for polydipsia and polyphagia.  Neurological: Positive for dizziness, light-headedness and numbness. Negative for weakness.  Psychiatric/Behavioral: Negative for confusion.       Objective:   Physical Exam  Constitutional: He appears well-nourished. No distress.  Cardiovascular: Normal rate, regular rhythm and normal heart sounds.   No murmur heard. Pulmonary/Chest: Effort normal and breath sounds normal. No respiratory distress.  Musculoskeletal: He exhibits no edema.  Lymphadenopathy:    He has no cervical adenopathy.  Neurological: He is alert.  Psychiatric: His behavior is normal.  Vitals reviewed.   25 minutes was spent  with the patient. Greater than half the time was spent in discussion and answering questions and counseling regarding the issues that the patient came in for today.  Index finger numbness probably related to nerve impingement but if he gets worse he is to let us now Right shoulder discomfort to do exercises play golf is best he can Patient with some abnormal moles especially on the midportion of his back he does need to see orthopedics    Assessment & Plan:  Diabetes-actually too good a control we need to back down on her medicine I recommend changing Glynase to 3 mg twice daily continue metformin follow-up in approximate 3 and at 4 months patient was warned regarding low sugar spells in the ramifications he will let us know if he is having low sugar spells.  Patient is disabled. Has had a stroke. No recent strokes. But he does have some mild cognitive issues as well as balance in intermittent dizziness I do not recommend meclizine  HTN good control currently blood pressure checked laying sitting standing there is a slight drop that occurs not severe continue low-dose lisinopril in order to help protect the kidneys  Hyperlipidemia continue medication. Check lipid profile.  Because of high risk medicine check metabolic 7 because of aspirin and Plavix use check CBC.  Patient does have some abnormal moles on his back we will go ahead and be referring him to dermatology for further evaluation

## 2015-09-19 NOTE — Addendum Note (Signed)
Addended by: [name] A on: 09/19/2015 12:45 PM   Modules accepted: Orders

## 2015-09-22 ENCOUNTER — Other Ambulatory Visit: Payer: Self-pay | Admitting: Neurology

## 2015-09-22 ENCOUNTER — Other Ambulatory Visit: Payer: Self-pay | Admitting: Family Medicine

## 2015-09-22 ENCOUNTER — Encounter: Payer: Self-pay | Admitting: Family Medicine

## 2015-09-22 DIAGNOSIS — Z79899 Other long term (current) drug therapy: Secondary | ICD-10-CM | POA: Diagnosis not present

## 2015-09-22 DIAGNOSIS — I10 Essential (primary) hypertension: Secondary | ICD-10-CM | POA: Diagnosis not present

## 2015-09-22 DIAGNOSIS — Z7982 Long term (current) use of aspirin: Secondary | ICD-10-CM | POA: Diagnosis not present

## 2015-09-22 DIAGNOSIS — E785 Hyperlipidemia, unspecified: Secondary | ICD-10-CM | POA: Diagnosis not present

## 2015-09-22 MED FILL — TOPIRAMATE 25 MG TABLET: 25 | 30 days supply | Qty: 120 | Fill #0

## 2015-09-22 MED FILL — CLOPIDOGREL 75 MG TABLET: 75 | 90 days supply | Qty: 90 | Fill #0

## 2015-09-23 ENCOUNTER — Encounter: Payer: Self-pay | Admitting: Family Medicine

## 2015-09-23 LAB — CBC WITH DIFFERENTIAL/PLATELET
Basophils Absolute: 0.1 10*3/uL (ref 0.0–0.2)
Basos: 1 %
EOS (ABSOLUTE): 0.3 10*3/uL (ref 0.0–0.4)
Eos: 7 %
Hematocrit: 41 % (ref 37.5–51.0)
Hemoglobin: 14.2 g/dL (ref 12.6–17.7)
IMMATURE GRANULOCYTES: 0 %
Immature Grans (Abs): 0 10*3/uL (ref 0.0–0.1)
Lymphocytes Absolute: 1.3 10*3/uL (ref 0.7–3.1)
Lymphs: 25 %
MCH: 32.9 pg (ref 26.6–33.0)
MCHC: 34.6 g/dL (ref 31.5–35.7)
MCV: 95 fL (ref 79–97)
MONOS ABS: 0.6 10*3/uL (ref 0.1–0.9)
Monocytes: 12 %
NEUTROS PCT: 55 %
Neutrophils Absolute: 2.7 10*3/uL (ref 1.4–7.0)
PLATELETS: 173 10*3/uL (ref 150–379)
RBC: 4.32 x10E6/uL (ref 4.14–5.80)
RDW: 14.4 % (ref 12.3–15.4)
WBC: 5 10*3/uL (ref 3.4–10.8)

## 2015-09-23 LAB — HEPATIC FUNCTION PANEL
ALK PHOS: 64 IU/L (ref 39–117)
ALT: 8 IU/L (ref 0–44)
AST: 16 IU/L (ref 0–40)
Albumin: 4.2 g/dL (ref 3.5–5.5)
Bilirubin Total: 0.4 mg/dL (ref 0.0–1.2)
Bilirubin, Direct: 0.07 mg/dL (ref 0.00–0.40)
Total Protein: 6.5 g/dL (ref 6.0–8.5)

## 2015-09-23 LAB — BASIC METABOLIC PANEL
BUN/Creatinine Ratio: 19 (ref 9–20)
BUN: 16 mg/dL (ref 6–24)
CALCIUM: 8.7 mg/dL (ref 8.7–10.2)
CHLORIDE: 105 mmol/L (ref 96–106)
CO2: 18 mmol/L (ref 18–29)
Creatinine, Ser: 0.86 mg/dL (ref 0.76–1.27)
GFR calc non Af Amer: 96 mL/min/{1.73_m2} (ref >59)
GFR, EST AFRICAN AMERICAN: 111 mL/min/{1.73_m2} (ref >59)
Glucose: 111 mg/dL — ABNORMAL HIGH (ref 65–99)
POTASSIUM: 4.4 mmol/L (ref 3.5–5.2)
SODIUM: 139 mmol/L (ref 134–144)

## 2015-09-23 LAB — LIPID PANEL
CHOLESTEROL TOTAL: 103 mg/dL (ref 100–199)
Chol/HDL Ratio: 3.6 ratio units (ref 0.0–5.0)
HDL: 29 mg/dL — AB (ref >39)
LDL Calculated: 49 mg/dL (ref 0–99)
Triglycerides: 127 mg/dL (ref 0–149)
VLDL CHOLESTEROL CAL: 25 mg/dL (ref 5–40)

## 2015-09-25 ENCOUNTER — Telehealth: Payer: Self-pay | Admitting: Family Medicine

## 2015-09-25 NOTE — Telephone Encounter (Signed)
Dentist office dropped off a form to be filled out for the pt to be able to have dental work. Form in yellow folder in dr office.

## 2015-09-25 NOTE — Telephone Encounter (Signed)
A form was filled out. Please print medication list or if unable to print this then please write it out and attached with his clearance. Then for this to the dentist thank you

## 2015-09-25 NOTE — Telephone Encounter (Signed)
Forwarded to medical records to have med list printed and faxed.

## 2015-09-26 ENCOUNTER — Other Ambulatory Visit: Payer: Self-pay | Admitting: Family Medicine

## 2015-09-26 MED FILL — MELOXICAM 15 MG TABLET: 15 | 30 days supply | Qty: 30 | Fill #6

## 2015-09-26 MED FILL — FOLIC ACID 1 MG TABLET: 1 | 90 days supply | Qty: 90 | Fill #0

## 2015-09-27 LAB — CUP PACEART REMOTE DEVICE CHECK: MDC IDC SESS DTM: 20170329123517

## 2015-09-27 NOTE — Progress Notes (Signed)
Carelink summary report received. Battery status OK. Normal device function. No new symptom episodes, tachy episodes, brady, or pause episodes. No new AF episodes. Monthly summary reports and ROV/PRN 

## 2015-09-30 ENCOUNTER — Ambulatory Visit (INDEPENDENT_AMBULATORY_CARE_PROVIDER_SITE_OTHER): Payer: 59 | Admitting: *Deleted

## 2015-09-30 DIAGNOSIS — I63443 Cerebral infarction due to embolism of bilateral cerebellar arteries: Secondary | ICD-10-CM | POA: Diagnosis not present

## 2015-10-01 NOTE — Progress Notes (Signed)
[facility] Summary Report / Loop Recorder 

## 2015-10-10 LAB — CUP PACEART REMOTE DEVICE CHECK: MDC IDC SESS DTM: 20170428130653

## 2015-10-23 ENCOUNTER — Other Ambulatory Visit: Payer: Self-pay | Admitting: Family Medicine

## 2015-10-23 MED FILL — GLYBURIDE MICRO 3 MG TABLET: 3 | 30 days supply | Qty: 60 | Fill #1

## 2015-10-23 MED FILL — TOPIRAMATE 25 MG TABLET: 25 | 30 days supply | Qty: 120 | Fill #1

## 2015-10-23 MED FILL — MELOXICAM 15 MG TABLET: 15 | 30 days supply | Qty: 30 | Fill #7

## 2015-10-23 MED FILL — LYRICA 100 MG CAPSULE: 100 | 90 days supply | Qty: 270 | Fill #1

## 2015-10-23 MED FILL — valACYclovir HCL 1 GM TABS: 1 | 90 days supply | Qty: 90 | Fill #0

## 2015-10-23 NOTE — Telephone Encounter (Signed)
May have 3 refills 

## 2015-10-28 ENCOUNTER — Ambulatory Visit (INDEPENDENT_AMBULATORY_CARE_PROVIDER_SITE_OTHER): Payer: 59 | Admitting: *Deleted

## 2015-10-28 DIAGNOSIS — I63443 Cerebral infarction due to embolism of bilateral cerebellar arteries: Secondary | ICD-10-CM

## 2015-10-28 NOTE — Progress Notes (Signed)
[facility] Summary Report / Loop Recorder 

## 2015-11-07 LAB — CUP PACEART REMOTE DEVICE CHECK: Date Time Interrogation Session: 20170528133542

## 2015-11-14 ENCOUNTER — Telehealth: Payer: Self-pay | Admitting: Cardiology

## 2015-11-14 LAB — CUP PACEART REMOTE DEVICE CHECK: MDC IDC SESS DTM: 20170627140529

## 2015-11-14 NOTE — Telephone Encounter (Signed)
Spoke w/ pt and requested that he send a manual transmission b/c his home monitor has not updated in at least 14 days.   

## 2015-11-17 ENCOUNTER — Other Ambulatory Visit: Payer: Self-pay | Admitting: Family Medicine

## 2015-11-17 MED FILL — metFORMIN HCL 500 MG TABS: 500 | 90 days supply | Qty: 360 | Fill #0

## 2015-11-17 MED FILL — GLYBURIDE MICRO 3 MG TABLET: 3 | 30 days supply | Qty: 60 | Fill #2

## 2015-11-17 MED FILL — TOPIRAMATE 25 MG TABLET: 25 | 30 days supply | Qty: 120 | Fill #2

## 2015-11-17 MED FILL — LISINOPRIL 2.5 MG TABLET: 2.5 | 90 days supply | Qty: 90 | Fill #0

## 2015-11-17 MED FILL — MELOXICAM 15 MG TABLET: 15 | 30 days supply | Qty: 30 | Fill #8

## 2015-11-24 MED FILL — traMADol HCL 50 MG TABS: 50 | 12 days supply | Qty: 35 | Fill #1

## 2015-11-27 ENCOUNTER — Ambulatory Visit (INDEPENDENT_AMBULATORY_CARE_PROVIDER_SITE_OTHER): Payer: 59 | Admitting: *Deleted

## 2015-11-27 DIAGNOSIS — I63443 Cerebral infarction due to embolism of bilateral cerebellar arteries: Secondary | ICD-10-CM

## 2015-11-27 NOTE — Progress Notes (Signed)
[facility] Summary Report / Loop Recorder 

## 2015-11-28 ENCOUNTER — Telehealth: Payer: Self-pay | Admitting: Cardiology

## 2015-11-28 NOTE — Telephone Encounter (Signed)
LMOVM requesting that pt send manual transmission b/c home monitor has not updated in at least 14 days.    

## 2015-12-03 ENCOUNTER — Encounter: Payer: Self-pay | Admitting: Cardiology

## 2015-12-05 MED FILL — traMADol HCL 50 MG TABS: 50 | 12 days supply | Qty: 35 | Fill #2

## 2015-12-05 MED FILL — ATORVASTATIN 20 MG TABLET: 20 | 90 days supply | Qty: 90 | Fill #1

## 2015-12-12 LAB — CUP PACEART REMOTE DEVICE CHECK: Date Time Interrogation Session: 20170727140653

## 2015-12-18 ENCOUNTER — Encounter: Payer: Self-pay | Admitting: Cardiology

## 2015-12-18 NOTE — Progress Notes (Signed)
Certified letter  

## 2015-12-19 ENCOUNTER — Ambulatory Visit: Payer: 59 | Admitting: Family Medicine

## 2015-12-22 MED FILL — FOLIC ACID 1 MG TABLET: 1 | 90 days supply | Qty: 90 | Fill #1

## 2015-12-22 MED FILL — TOPIRAMATE 25 MG TABLET: 25 | 30 days supply | Qty: 120 | Fill #3

## 2015-12-22 MED FILL — MELOXICAM 15 MG TABLET: 15 | 30 days supply | Qty: 30 | Fill #9

## 2015-12-22 MED FILL — CLOPIDOGREL 75 MG TABLET: 75 | 90 days supply | Qty: 90 | Fill #1

## 2015-12-24 ENCOUNTER — Other Ambulatory Visit: Payer: Self-pay | Admitting: Family Medicine

## 2015-12-24 MED FILL — GLYBURIDE MICRO 3 MG TABLET: 3 | 45 days supply | Qty: 90 | Fill #0

## 2015-12-29 ENCOUNTER — Ambulatory Visit (INDEPENDENT_AMBULATORY_CARE_PROVIDER_SITE_OTHER): Payer: 59 | Admitting: *Deleted

## 2015-12-29 DIAGNOSIS — I63443 Cerebral infarction due to embolism of bilateral cerebellar arteries: Secondary | ICD-10-CM

## 2015-12-30 NOTE — Progress Notes (Signed)
[facility] Summary Report / Loop Recorder 

## 2015-12-31 ENCOUNTER — Encounter: Payer: Self-pay | Admitting: Family Medicine

## 2015-12-31 ENCOUNTER — Ambulatory Visit (INDEPENDENT_AMBULATORY_CARE_PROVIDER_SITE_OTHER): Payer: 59 | Admitting: Family Medicine

## 2015-12-31 VITALS — BP 132/88 | Ht 73.0 in | Wt 178.0 lb

## 2015-12-31 DIAGNOSIS — I639 Cerebral infarction, unspecified: Secondary | ICD-10-CM | POA: Diagnosis not present

## 2015-12-31 DIAGNOSIS — E785 Hyperlipidemia, unspecified: Secondary | ICD-10-CM | POA: Diagnosis not present

## 2015-12-31 DIAGNOSIS — I10 Essential (primary) hypertension: Secondary | ICD-10-CM | POA: Diagnosis not present

## 2015-12-31 DIAGNOSIS — E119 Type 2 diabetes mellitus without complications: Secondary | ICD-10-CM

## 2015-12-31 DIAGNOSIS — K625 Hemorrhage of anus and rectum: Secondary | ICD-10-CM

## 2015-12-31 DIAGNOSIS — I6930 Unspecified sequelae of cerebral infarction: Secondary | ICD-10-CM

## 2015-12-31 LAB — POCT GLYCOSYLATED HEMOGLOBIN (HGB A1C): HEMOGLOBIN A1C: 5.3

## 2015-12-31 MED ORDER — GLYBURIDE MICRONIZED 3 MG PO TABS: ORAL_TABLET | ORAL | 1 refills | Status: DC

## 2015-12-31 NOTE — Progress Notes (Signed)
   Subjective:    Patient ID: Travis [name], male    DOB: 27-Jun-1960, 55 y.o.   MRN: [id number]  Diabetes  He presents for his follow-up diabetic visit. He has type 2 diabetes mellitus. No MedicAlert identification noted. Pertinent negatives for hypoglycemia include no confusion. Pertinent negatives for diabetes include no fatigue, no polydipsia, no polyphagia and no weakness. He has not had a previous visit with a dietitian. He does not see a podiatrist.Eye exam is not current.   Patient would like to discuss to have a colonoscopy. Family relates that the patient did have blood in his bowel movement several different times a few weeks back. He denies any severe abdominal pain. He had a colonoscopy a proximally 5 years ago.  Also has c/o of speech and numbness in fingers. He's had more difficulty with his speech. The patient states he has not noticed this as much. His wife states she notices it more. She also relates some forgetfulness but at the same time states that he seems to be able to remember things from the past without much difficulty.  Patient does relate he takes his aspirin as well as meloxicam and Plavix paced states he does not think that bothers his stomach He does take his Lyrica as well as Topamax and uses tramadol on a when necessary basis He also takes glib uritis as well as metformin and denies any low sugar spells     Review of Systems  Constitutional: Negative for activity change, appetite change and fatigue.  Endocrine: Negative for polydipsia and polyphagia.  Neurological: Negative for weakness.  Psychiatric/Behavioral: Negative for confusion.       Objective:   Physical Exam  Constitutional: He appears well-nourished. No distress.  Cardiovascular: Normal rate, regular rhythm and normal heart sounds.   No murmur heard. Pulmonary/Chest: Effort normal and breath sounds normal. No respiratory distress.  Musculoskeletal: He exhibits no edema.  Lymphadenopathy:    He has no cervical adenopathy.  Neurological: He is alert.  Psychiatric: His behavior is normal.  Vitals reviewed.  Diabetic foot exam normal Subjective numbness into the left hand strength is good pulses are good reflexes good       Assessment & Plan:  Diabetes-his A1c is actually too low I recommend reducing the globe URI to reduce her risk of a hypoglycemic spell we will drop it down to a half of a tablet each morning.  Hypertension blood pressure under good control continue current measures  Hyperlipidemia previous lab work reviewed continue Lipitor  Subjective numbness into the left arm along with some increased difficulty with speech and memory he has an appointment with neurology coming up in a few weeks I do not see dramatic change compared to previous for this patient neurology will help decide if the patient needs nerve conduction studies-which were normal 1 year ago-or a repeat MRI if anything.  25 minutes spent with patient greater than half in discussion of multiple different aspects.  Patient will follow-up in here for months and check A1c at that time

## 2016-01-01 LAB — CBC WITH DIFFERENTIAL/PLATELET
Basophils Absolute: 0 10*3/uL (ref 0.0–0.2)
Basos: 0 %
EOS (ABSOLUTE): 0.3 10*3/uL (ref 0.0–0.4)
EOS: 5 %
HEMATOCRIT: 41.3 % (ref 37.5–51.0)
HEMOGLOBIN: 14.1 g/dL (ref 12.6–17.7)
IMMATURE GRANS (ABS): 0 10*3/uL (ref 0.0–0.1)
IMMATURE GRANULOCYTES: 0 %
LYMPHS ABS: 1.5 10*3/uL (ref 0.7–3.1)
LYMPHS: 22 %
MCH: 32.8 pg (ref 26.6–33.0)
MCHC: 34.1 g/dL (ref 31.5–35.7)
MCV: 96 fL (ref 79–97)
MONOCYTES: 10 %
Monocytes Absolute: 0.6 10*3/uL (ref 0.1–0.9)
Neutrophils Absolute: 4.2 10*3/uL (ref 1.4–7.0)
Neutrophils: 63 %
Platelets: 174 10*3/uL (ref 150–379)
RBC: 4.3 x10E6/uL (ref 4.14–5.80)
RDW: 14.4 % (ref 12.3–15.4)
WBC: 6.7 10*3/uL (ref 3.4–10.8)

## 2016-01-01 LAB — FERRITIN: FERRITIN: 73 ng/mL (ref 30–400)

## 2016-01-01 LAB — IRON AND TIBC
IRON SATURATION: 27 % (ref 15–55)
IRON: 72 ug/dL (ref 38–169)
TIBC: 271 ug/dL (ref 250–450)
UIBC: 199 ug/dL (ref 111–343)

## 2016-01-16 ENCOUNTER — Other Ambulatory Visit: Payer: Self-pay | Admitting: Family Medicine

## 2016-01-16 MED FILL — valACYclovir HCL 1 GM TABS: 1 | 90 days supply | Qty: 90 | Fill #0

## 2016-01-16 MED FILL — TOPIRAMATE 25 MG TABLET: 25 | 30 days supply | Qty: 120 | Fill #4

## 2016-01-16 MED FILL — traMADol HCL 50 MG TABS: 50 | 12 days supply | Qty: 35 | Fill #3

## 2016-01-22 ENCOUNTER — Ambulatory Visit: Payer: 59 | Admitting: Neurology

## 2016-01-23 ENCOUNTER — Encounter: Payer: Self-pay | Admitting: Neurology

## 2016-01-24 LAB — CUP PACEART REMOTE DEVICE CHECK: Date Time Interrogation Session: 20170826143650

## 2016-01-24 NOTE — Progress Notes (Signed)
Carelink summary report received. Battery status OK. Normal device function. No new symptom episodes, tachy episodes, brady, or pause episodes. No new AF episodes. Monthly summary reports and ROV/PRN 

## 2016-01-26 ENCOUNTER — Ambulatory Visit (INDEPENDENT_AMBULATORY_CARE_PROVIDER_SITE_OTHER): Payer: 59 | Admitting: *Deleted

## 2016-01-26 DIAGNOSIS — I63443 Cerebral infarction due to embolism of bilateral cerebellar arteries: Secondary | ICD-10-CM | POA: Diagnosis not present

## 2016-01-26 NOTE — Progress Notes (Signed)
[facility] Summary Report / Loop Recorder 

## 2016-01-27 ENCOUNTER — Ambulatory Visit: Payer: 59

## 2016-01-29 ENCOUNTER — Ambulatory Visit (INDEPENDENT_AMBULATORY_CARE_PROVIDER_SITE_OTHER): Payer: 59 | Admitting: Internal Medicine

## 2016-01-29 ENCOUNTER — Encounter: Payer: Self-pay | Admitting: Internal Medicine

## 2016-01-29 ENCOUNTER — Encounter (INDEPENDENT_AMBULATORY_CARE_PROVIDER_SITE_OTHER): Payer: Self-pay

## 2016-01-29 ENCOUNTER — Ambulatory Visit: Payer: 59 | Admitting: Internal Medicine

## 2016-01-29 VITALS — BP 114/60 | HR 84 | Ht 71.0 in | Wt 171.4 lb

## 2016-01-29 DIAGNOSIS — K625 Hemorrhage of anus and rectum: Secondary | ICD-10-CM

## 2016-01-29 MED ORDER — NA SULFATE-K SULFATE-MG SULF 17.5-3.13-1.6 GM/177ML PO SOLN: 1.0000 | Freq: Once | ORAL | 0 refills | Status: AC

## 2016-01-29 NOTE — Patient Instructions (Signed)
You have been scheduled for a colonoscopy. Please follow written instructions given to you at your visit today.  Please pick up your prep supplies at the pharmacy within the next 1-3 days. If you use inhalers (even only as needed), please bring them with you on the day of your procedure.   

## 2016-01-29 NOTE — Progress Notes (Signed)
HISTORY OF PRESENT ILLNESS:  Travis [name] is a 55 y.o. male with multiple medical problems as listed below. He presents today for evaluation of recurrent rectal bleeding. He is accompanied by his wife. He has history of stroke 1 year ago for which she is on aspirin and Plavix. Also diabetes for which she is on oral agents. He was last evaluated in this office November 2015 regarding minor rectal bleeding. This was felt secondary to hemorrhoids, though rectal exam was unremarkable. He was treated with topical therapy and improved. In recent months he has noticed recurrent bleeding. Worse after constipation. No associated rectal pain. He has had some weight loss. His also bloating. Bowels have been more constipated. Patient does have prior history of sigmoid colectomy for diverticular disease. His last colonoscopy October 2012 was negative except for postop changes. Internal hemorrhoids noted on retroflexion picture.  REVIEW OF SYSTEMS:  All non-GI ROS negative except for anxiety, arthritis, back pain, confusion, fatigue, muscle cramps, right-sided weakness  Past Medical History:  Diagnosis Date  . 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 Anxiety   . Arthritis   . Chronic headaches   . Complication of anesthesia    bleed after last shoulder-had to stay overnight  . Depression   . Diabetes mellitus   . Diabetic peripheral neuropathy ([place]) 03/28/2013  . Diverticulosis   . Dizziness   . Hernia, inguinal   . Hyperlipidemia   . Hypertension   . Impingement syndrome of left shoulder 08/02/2014  . 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 Neuropathy of lower extremity   . Night sweats    every once in a while  . Stroke ([place]) 02/2015  . Syncope and collapse     Past Surgical History:  Procedure Laterality Date  . CERVICAL DISC SURGERY    . COLON SURGERY  2003   1/3 removed for diverticulitis  . COLONOSCOPY    . EP IMPLANTABLE DEVICE N/A 05/01/2015   Procedure: Loop Recorder  Insertion;  Surgeon: [name], MD;  Location: [facility];  Service: Cardiovascular;  Laterality: N/A;  . INGUINAL HERNIA REPAIR Bilateral   . KNEE ARTHROSCOPY Right   . SHOULDER ARTHROSCOPY Right    1  . SHOULDER ARTHROSCOPY Left 08/02/2014   Procedure: LEFT SHOULDER SCOPE DEBRIDEMENT/ACROMIOPLASTY;  Surgeon: [name], MD;  Location: [facility];  Service: Orthopedics;  Laterality: Left;  ANESTHESIA: GENERAL, PRE/POST OP SCALENE  . TEE WITHOUT CARDIOVERSION N/A 03/03/2015   Procedure: TRANSESOPHAGEAL ECHOCARDIOGRAM (TEE);  Surgeon: [name], MD;  [facility];  Service: Cardiology;  Laterality: N/A;  . UMBILICAL HERNIA REPAIR     with other hernia repair with mesh  . WRIST SURGERY Right    fusion    Social History GURVIR THANE  reports that he quit smoking about 37 years ago. His smoking use included Cigarettes. He started smoking about 45 years ago. He has a 8.00 pack-year smoking history. He quit smokeless tobacco use about 37 years ago. His smokeless tobacco use included Chew. He reports that he drinks about 0.6 oz of alcohol per week . He reports that he does not use drugs.  family history includes ALS (age of onset: 85) in his brother; Dementia in his mother; Diabetes in his maternal grandfather; Heart attack (age of onset: 41) in his sister; Hyperlipidemia in his father; Stroke in his father.  No Known Allergies     PHYSICAL EXAMINATION: Vital signs: BP 114/60   Pulse  84   Ht 5\' 11"  (1.803 m) Comment: w/o shoes  Wt 171 lb 6 oz (77.7 kg)   BMI 23.90 kg/m   Constitutional: Pleasant, generally well-appearing, no acute distress. Slurred speech Psychiatric: alert and oriented x3, cooperative Eyes: extraocular movements intact, anicteric, conjunctiva pink Mouth: oral pharynx moist, no lesions Neck: supple without thyromegaly Lymph: no lymphadenopathy Cardiovascular: heart regular rate and rhythm, no murmur Lungs: clear to  auscultation bilaterally Abdomen: soft, nontender, nondistended, no obvious ascites, no peritoneal signs, normal bowel sounds, no organomegaly Rectal: Deferred to colonoscopy Extremities: no clubbing cyanosis or lower extremity edema bilaterally Skin: no lesions on visible extremities Neuro: Speech impairment, right-sided weakness,   ASSESSMENT:  1. Rectal bleeding. Likely benign anorectal pathology. Rule out interval neoplasia 2. History of complicated diverticulitis status post sigmoid colectomy 3. Multiple significant medical problems including previous stroke which he is on aspirin and Plavix and diabetes   PLAN:  1. Colonoscopy. Patient is high-risk given his comorbidities, anticoagulation status, and diabetes.The nature of the procedure, as well as the risks, benefits, and alternatives were carefully and thoroughly reviewed with the patient. Ample time for discussion and questions allowed. The patient understood, was satisfied, and agreed to proceed. 2. Continue ON aspirin and Plavix for the examination. The risk benefit profile this strategy reviewed. And a standard agree 3. Hold diabetic medications the morning of the examination to avoid hypoglycemia

## 2016-02-01 DIAGNOSIS — I4891 Unspecified atrial fibrillation: Secondary | ICD-10-CM

## 2016-02-01 HISTORY — DX: Unspecified atrial fibrillation: I48.91

## 2016-02-04 ENCOUNTER — Ambulatory Visit: Payer: 59

## 2016-02-06 ENCOUNTER — Ambulatory Visit (INDEPENDENT_AMBULATORY_CARE_PROVIDER_SITE_OTHER): Payer: 59 | Admitting: *Deleted

## 2016-02-06 DIAGNOSIS — Z23 Encounter for immunization: Secondary | ICD-10-CM

## 2016-02-06 MED FILL — traMADol HCL 50 MG TABS: 50 | 12 days supply | Qty: 35 | Fill #4

## 2016-02-06 MED FILL — SUPREP BOWEL PREP KIT: 17.5-3.13-1 | 1 days supply | Qty: 354 | Fill #0

## 2016-02-12 ENCOUNTER — Ambulatory Visit (AMBULATORY_SURGERY_CENTER): Payer: 59 | Admitting: Internal Medicine

## 2016-02-12 ENCOUNTER — Encounter: Payer: Self-pay | Admitting: Internal Medicine

## 2016-02-12 VITALS — BP 113/83 | HR 68 | Temp 98.0°F | Resp 15 | Ht 71.0 in | Wt 171.0 lb

## 2016-02-12 DIAGNOSIS — K625 Hemorrhage of anus and rectum: Secondary | ICD-10-CM | POA: Diagnosis not present

## 2016-02-12 DIAGNOSIS — I10 Essential (primary) hypertension: Secondary | ICD-10-CM | POA: Diagnosis not present

## 2016-02-12 DIAGNOSIS — Z8673 Personal history of transient ischemic attack (TIA), and cerebral infarction without residual deficits: Secondary | ICD-10-CM | POA: Diagnosis not present

## 2016-02-12 DIAGNOSIS — E119 Type 2 diabetes mellitus without complications: Secondary | ICD-10-CM | POA: Diagnosis not present

## 2016-02-12 DIAGNOSIS — R42 Dizziness and giddiness: Secondary | ICD-10-CM | POA: Diagnosis not present

## 2016-02-12 DIAGNOSIS — G4733 Obstructive sleep apnea (adult) (pediatric): Secondary | ICD-10-CM | POA: Diagnosis not present

## 2016-02-12 LAB — GLUCOSE, CAPILLARY
Glucose-Capillary: 131 mg/dL — ABNORMAL HIGH (ref 65–99)
Glucose-Capillary: 98 mg/dL (ref 65–99)

## 2016-02-12 MED ORDER — SODIUM CHLORIDE 0.9 % IV SOLN: 500.0000 mL | INTRAVENOUS | Status: DC

## 2016-02-12 NOTE — Progress Notes (Signed)
To PACU, vss patent aw report to rn 

## 2016-02-12 NOTE — Op Note (Signed)
[facility] Patient Name: Jama Dilone Procedure Date: 02/12/2016 3:34 PM MRN: [id number] Endoscopist: Docia Chuck. Henrene Pastor , MD Age: 55 Referring MD:  Date of Birth: [birth year] Gender: Male Account #: [id number] Procedure:                Colonoscopy Indications:              Rectal bleeding Medicines:                Monitored Anesthesia Care Procedure:                Pre-Anesthesia Assessment:                           - Prior to the procedure, a History and Physical                            was performed, and patient medications and                            allergies were reviewed. The patient's tolerance of                            previous anesthesia was also reviewed. The risks                            and benefits of the procedure and the sedation                            options and risks were discussed with the patient.                            All questions were answered, and informed consent                            was obtained. Prior Anticoagulants: The patient is                            on aspirin and Plavix and has continued this                            without interruption. ASA Grade Assessment: II - A                            patient with mild systemic disease. After reviewing                            the risks and benefits, the patient was deemed in                            satisfactory condition to undergo the procedure.                           After obtaining informed consent, the colonoscope  was passed under direct vision. Throughout the                            procedure, the patient's blood pressure, pulse, and                            oxygen saturations were monitored continuously. The                            Model CF-HQ190L [id number]) scope was introduced                            through the anus and advanced to the the cecum,                            identified by appendiceal orifice and  ileocecal                            valve. The ileocecal valve, appendiceal orifice,                            and rectum were photographed. The quality of the                            bowel preparation was excellent. The colonoscopy                            was performed without difficulty. The patient                            tolerated the procedure well. The bowel preparation                            used was SUPREP. Scope In: 3:55:57 PM Scope Out: 4:07:48 PM Scope Withdrawal Time: 0 hours 9 minutes 7 seconds  Total Procedure Duration: 0 hours 11 minutes 51 seconds  Findings:                 There is evidence of prior sigmoid colon resection.                            This area was unremarkable.The exam was otherwise                            without abnormality on direct and retroflexion                            views. Complications:            No immediate complications. Estimated blood loss:                            None. Estimated Blood Loss:     Estimated blood loss: none. Impression:               - Status post sigmoid colon  resection                           - The examination was otherwise normal on direct                            and retroflexion views.                           - Small internal hemorrhoids. Recommendation:           - Repeat colonoscopy in 10 years for screening                            purposes.                           - Continue present medications. Docia Chuck. Henrene Pastor, MD 02/12/2016 4:22:42 PM This report has been signed electronically.

## 2016-02-12 NOTE — Patient Instructions (Signed)
YOU HAD AN ENDOSCOPIC PROCEDURE TODAY AT [facility]:   Refer to the procedure report that was given to you for any specific questions about what was found during the examination.  If the procedure report does not answer your questions, please call your gastroenterologist to clarify.  If you requested that your care partner not be given the details of your procedure findings, then the procedure report has been included in a sealed envelope for you to review at your convenience later.  YOU SHOULD EXPECT: Some feelings of bloating in the abdomen. Passage of more gas than usual.  Walking can help get rid of the air that was put into your GI tract during the procedure and reduce the bloating. If you had a lower endoscopy (such as a colonoscopy or flexible sigmoidoscopy) you may notice spotting of blood in your stool or on the toilet paper. If you underwent a bowel prep for your procedure, you may not have a normal bowel movement for a few days.  Please Note:  You might notice some irritation and congestion in your nose or some drainage.  This is from the oxygen used during your procedure.  There is no need for concern and it should clear up in a day or so.  SYMPTOMS TO REPORT IMMEDIATELY:   Following lower endoscopy (colonoscopy or flexible sigmoidoscopy):  Excessive amounts of blood in the stool  Significant tenderness or worsening of abdominal pains  Swelling of the abdomen that is new, acute  Fever of 100F or higher    For urgent or emergent issues, a gastroenterologist can be reached at any hour by calling [phone].   DIET:  We do recommend a small meal at first, but then you may proceed to your regular diet.  Drink plenty of fluids but you should avoid alcoholic beverages for 24 hours.  ACTIVITY:  You should plan to take it easy for the rest of today and you should NOT DRIVE or use heavy machinery until tomorrow (because of the sedation medicines used during the test).     FOLLOW UP: Our staff will call the number listed on your records the next business day following your procedure to check on you and address any questions or concerns that you may have regarding the information given to you following your procedure. If we do not reach you, we will leave a message.  However, if you are feeling well and you are not experiencing any problems, there is no need to return our call.  We will assume that you have returned to your regular daily activities without incident.  If any biopsies were taken you will be contacted by phone or by letter within the next 1-3 weeks.  Please call us at [phone] if you have not heard about the biopsies in 3 weeks.    SIGNATURES/CONFIDENTIALITY: You and/or your care partner have signed paperwork which will be entered into your electronic medical record.  These signatures attest to the fact that that the information above on your After Visit Summary has been reviewed and is understood.  Full responsibility of the confidentiality of this discharge information lies with you and/or your care-partner.  Hemorrhoids-handout given  Repeat colonoscopy in 10 years 2027.

## 2016-02-12 NOTE — Progress Notes (Signed)
Pt drank "a couple of sips of Diet Sundrop at 1328."  [place] CRNA made aware

## 2016-02-13 ENCOUNTER — Telehealth: Payer: Self-pay

## 2016-02-13 ENCOUNTER — Other Ambulatory Visit: Payer: Self-pay | Admitting: Family Medicine

## 2016-02-13 ENCOUNTER — Telehealth: Payer: Self-pay | Admitting: Family Medicine

## 2016-02-13 MED ORDER — PREGABALIN 100 MG PO CAPS: 100.0000 mg | ORAL_CAPSULE | Freq: Three times a day (TID) | ORAL | 0 refills | Status: DC

## 2016-02-13 MED FILL — LYRICA 100 MG CAPSULE: 100 | 90 days supply | Qty: 270 | Fill #0

## 2016-02-13 NOTE — Telephone Encounter (Signed)
Patient needs a refill of Lyrica.  He only has two pills left and his wife would like to pick it up today.  [facility]

## 2016-02-13 NOTE — Telephone Encounter (Signed)
Med sent to pharmacy. [name] was notified.

## 2016-02-13 NOTE — Telephone Encounter (Signed)
Ok plus two ref 

## 2016-02-13 NOTE — Telephone Encounter (Signed)
  Follow up Call-  Call back number 02/12/2016  Post procedure Call Back phone  # [phone] wife's phone  Permission to leave phone message Yes  Some recent data might be hidden    Patient was called for follow up after his procedure on 02/12/2016. I spoke with [name] the patients wife and she reports that Travis [name] has returned to his normal daily activities without any difficulty.

## 2016-02-25 ENCOUNTER — Ambulatory Visit: Payer: 59 | Admitting: Internal Medicine

## 2016-02-25 ENCOUNTER — Telehealth: Payer: Self-pay | Admitting: Family Medicine

## 2016-02-25 ENCOUNTER — Ambulatory Visit (INDEPENDENT_AMBULATORY_CARE_PROVIDER_SITE_OTHER): Payer: 59 | Admitting: *Deleted

## 2016-02-25 DIAGNOSIS — I63443 Cerebral infarction due to embolism of bilateral cerebellar arteries: Secondary | ICD-10-CM

## 2016-02-25 MED ORDER — LISINOPRIL 2.5 MG PO TABS: 2.5000 mg | ORAL_TABLET | Freq: Every morning | ORAL | 1 refills | Status: DC

## 2016-02-25 MED ORDER — METFORMIN HCL 500 MG PO TABS: ORAL_TABLET | ORAL | 1 refills | Status: DC

## 2016-02-25 MED ORDER — TRAMADOL HCL 50 MG PO TABS: 50.0000 mg | ORAL_TABLET | Freq: Three times a day (TID) | ORAL | 5 refills | Status: DC | PRN

## 2016-02-25 MED FILL — traMADol HCL 50 MG TABS: 50 | 12 days supply | Qty: 35 | Fill #5

## 2016-02-25 MED FILL — LISINOPRIL 2.5 MG TABLET: 2.5 | 90 days supply | Qty: 90 | Fill #0

## 2016-02-25 MED FILL — metFORMIN HCL 500 MG TABS: 500 | 90 days supply | Qty: 360 | Fill #0

## 2016-02-25 NOTE — Telephone Encounter (Signed)
Patient needs Rx for Lisinopril, Metformin and Tramadol.  Patients' spouse says they have been trying to get this filled for a week, but the pharmacy says we haven't responded.   [facility]

## 2016-02-25 NOTE — Telephone Encounter (Signed)
Prescriptions sent to pharmacy. Patient notified. ?

## 2016-02-25 NOTE — Telephone Encounter (Signed)
Ok thre e mo worth 

## 2016-02-25 NOTE — Progress Notes (Signed)
[facility] Summary Report / Loop Recorder 

## 2016-02-26 ENCOUNTER — Ambulatory Visit (INDEPENDENT_AMBULATORY_CARE_PROVIDER_SITE_OTHER): Payer: 59 | Admitting: Family Medicine

## 2016-02-26 ENCOUNTER — Ambulatory Visit (HOSPITAL_COMMUNITY)
Admission: RE | Admit: 2016-02-26 | Discharge: 2016-02-26 | Disposition: A | Discharge status: Home / Self Care | Payer: 59 | Source: Ambulatory Visit | Attending: Family Medicine | Admitting: Family Medicine

## 2016-02-26 ENCOUNTER — Encounter: Payer: Self-pay | Admitting: Family Medicine

## 2016-02-26 VITALS — BP 134/86 | Ht 73.0 in | Wt 171.5 lb

## 2016-02-26 DIAGNOSIS — J019 Acute sinusitis, unspecified: Secondary | ICD-10-CM | POA: Diagnosis not present

## 2016-02-26 DIAGNOSIS — R05 Cough: Secondary | ICD-10-CM | POA: Insufficient documentation

## 2016-02-26 DIAGNOSIS — R0602 Shortness of breath: Secondary | ICD-10-CM | POA: Diagnosis not present

## 2016-02-26 DIAGNOSIS — R634 Abnormal weight loss: Secondary | ICD-10-CM

## 2016-02-26 DIAGNOSIS — R059 Cough, unspecified: Secondary | ICD-10-CM

## 2016-02-26 MED ORDER — METFORMIN HCL 500 MG PO TABS: ORAL_TABLET | ORAL | 1 refills | Status: DC

## 2016-02-26 MED ORDER — AZITHROMYCIN 250 MG PO TABS
ORAL_TABLET | ORAL | 0 refills | Status: DC
Start: 2016-02-26 — End: 2016-03-24

## 2016-02-26 NOTE — Telephone Encounter (Signed)
ERROR

## 2016-02-26 NOTE — Progress Notes (Signed)
   Subjective:    Patient ID: Travis [name], male    DOB: 05-Dec-1960, 55 y.o.   MRN: [id number]  Cough  This is a new problem. The current episode started in the past 7 days. The cough is productive of sputum. Associated symptoms include nasal congestion and rhinorrhea. Pertinent negatives include no chest pain, ear pain, fever or wheezing. Treatments tried: Afrin    States no other concerns this visit. Patient with several days head congestion drainage coughing denies high fever chills relates energy level subpar has lost little bit of weight but he thinks its because he doesn't have as much of an appetite  Review of Systems  Constitutional: Negative for activity change and fever.  HENT: Positive for congestion and rhinorrhea. Negative for ear pain.   Eyes: Negative for discharge.  Respiratory: Positive for cough. Negative for wheezing.   Cardiovascular: Negative for chest pain.       Objective:   Physical Exam  Constitutional: He appears well-developed.  HENT:  Head: Normocephalic.  Mouth/Throat: Oropharynx is clear and moist. No oropharyngeal exudate.  Neck: Normal range of motion.  Cardiovascular: Normal rate, regular rhythm and normal heart sounds.   No murmur heard. Pulmonary/Chest: Effort normal and breath sounds normal. He has no wheezes.  Lymphadenopathy:    He has no cervical adenopathy.  Neurological: He exhibits normal muscle tone.  Skin: Skin is warm and dry.  Nursing note and vitals reviewed.         Assessment & Plan:  Viral syndrome Secondary rhinosinusitis Bronchial component X-rays antibiotics prescribed Reduce metformin to a 1 twice daily this may be contribute in the loss of appetite and some weight loss Lab work ordered Keep regular follow-up office visits.

## 2016-02-27 LAB — CBC WITH DIFFERENTIAL/PLATELET
BASOS ABS: 0 10*3/uL (ref 0.0–0.2)
Basos: 1 %
EOS (ABSOLUTE): 0.2 10*3/uL (ref 0.0–0.4)
EOS: 3 %
HEMATOCRIT: 40.3 % (ref 37.5–51.0)
HEMOGLOBIN: 14.2 g/dL (ref 12.6–17.7)
IMMATURE GRANS (ABS): 0 10*3/uL (ref 0.0–0.1)
Immature Granulocytes: 0 %
LYMPHS ABS: 1.5 10*3/uL (ref 0.7–3.1)
LYMPHS: 25 %
MCH: 32.8 pg (ref 26.6–33.0)
MCHC: 35.2 g/dL (ref 31.5–35.7)
MCV: 93 fL (ref 79–97)
MONOCYTES: 9 %
Monocytes Absolute: 0.5 10*3/uL (ref 0.1–0.9)
Neutrophils Absolute: 3.9 10*3/uL (ref 1.4–7.0)
Neutrophils: 62 %
Platelets: 232 10*3/uL (ref 150–379)
RBC: 4.33 x10E6/uL (ref 4.14–5.80)
RDW: 14 % (ref 12.3–15.4)
WBC: 6.1 10*3/uL (ref 3.4–10.8)

## 2016-02-27 LAB — BASIC METABOLIC PANEL
BUN / CREAT RATIO: 18 (ref 9–20)
BUN: 17 mg/dL (ref 6–24)
CALCIUM: 9.4 mg/dL (ref 8.7–10.2)
CHLORIDE: 108 mmol/L — AB (ref 96–106)
CO2: 25 mmol/L (ref 18–29)
CREATININE: 0.94 mg/dL (ref 0.76–1.27)
GFR calc non Af Amer: 90 mL/min/{1.73_m2} (ref >59)
GFR, EST AFRICAN AMERICAN: 104 mL/min/{1.73_m2} (ref >59)
GLUCOSE: 79 mg/dL (ref 65–99)
Potassium: 4.2 mmol/L (ref 3.5–5.2)
Sodium: 146 mmol/L — ABNORMAL HIGH (ref 134–144)

## 2016-03-01 ENCOUNTER — Ambulatory Visit: Payer: 59 | Admitting: Internal Medicine

## 2016-03-02 ENCOUNTER — Telehealth: Payer: Self-pay | Admitting: *Deleted

## 2016-03-02 DIAGNOSIS — I480 Paroxysmal atrial fibrillation: Secondary | ICD-10-CM | POA: Insufficient documentation

## 2016-03-02 NOTE — Telephone Encounter (Signed)
LM with patient's wife requesting that he call me back tomorrow.  She wrote down my number and is agreeable to this plan.  Also LMOVM for patient requesting call back.  [facility] phone number for return call.  Per Dr. 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

## 2016-03-04 DIAGNOSIS — L821 Other seborrheic keratosis: Secondary | ICD-10-CM | POA: Diagnosis not present

## 2016-03-04 DIAGNOSIS — D485 Neoplasm of uncertain behavior of skin: Secondary | ICD-10-CM | POA: Diagnosis not present

## 2016-03-04 DIAGNOSIS — D1801 Hemangioma of skin and subcutaneous tissue: Secondary | ICD-10-CM | POA: Diagnosis not present

## 2016-03-04 DIAGNOSIS — D225 Melanocytic nevi of trunk: Secondary | ICD-10-CM | POA: Diagnosis not present

## 2016-03-04 NOTE — Telephone Encounter (Signed)
LMOVM requesting call back.  Gave [facility] phone number for return call. 

## 2016-03-05 ENCOUNTER — Other Ambulatory Visit: Payer: Self-pay | Admitting: Nurse Practitioner

## 2016-03-05 MED FILL — ATORVASTATIN 20 MG TABLET: 20 | 90 days supply | Qty: 90 | Fill #0

## 2016-03-08 NOTE — Telephone Encounter (Signed)
LMOVM requesting call back.  Gave [facility] phone number for return call. 

## 2016-03-11 LAB — CUP PACEART REMOTE DEVICE CHECK
Date Time Interrogation Session: 20170925161304
Implantable Pulse Generator Implant Date: 20161229

## 2016-03-11 NOTE — Progress Notes (Signed)
Carelink summary report received. Battery status OK. Normal device function. No new symptom episodes, tachy episodes, brady, or pause episodes. No new AF episodes. Monthly summary reports and ROV/PRN 

## 2016-03-12 NOTE — Telephone Encounter (Signed)
LMOVM for patient and LMOVM for wife (EC) requesting call back.  [facility] phone number for return call.  Patient needs AF clinic appointment per Dr. Caryl Comes to discuss initiating [facility] for new PAF.

## 2016-03-17 ENCOUNTER — Encounter: Payer: Self-pay | Admitting: *Deleted

## 2016-03-17 NOTE — Telephone Encounter (Signed)
LMOVM for patient requesting call.  [facility] schedule for return phone call.  Will send letter requesting follow-up.

## 2016-03-18 ENCOUNTER — Other Ambulatory Visit: Payer: Self-pay | Admitting: Family Medicine

## 2016-03-18 MED FILL — traMADol HCL 50 MG TABS: 50 | 12 days supply | Qty: 35 | Fill #0

## 2016-03-18 MED FILL — FOLIC ACID 1 MG TABLET: 1 | 90 days supply | Qty: 90 | Fill #0

## 2016-03-18 MED FILL — CLOPIDOGREL 75 MG TABLET: 75 | 90 days supply | Qty: 90 | Fill #0

## 2016-03-19 NOTE — Telephone Encounter (Signed)
Letter mailed 03/17/16 requesting follow-up.

## 2016-03-22 ENCOUNTER — Telehealth: Payer: Self-pay | Admitting: Cardiology

## 2016-03-22 NOTE — Telephone Encounter (Signed)
Pt wife called and had some questions. Please call her back on her cell phone at [phone].

## 2016-03-22 NOTE — Telephone Encounter (Signed)
Number for patient to call is [phone] to schedule appt.

## 2016-03-22 NOTE — Telephone Encounter (Signed)
Patient called at 10:16am, states that I should talk to his wife regarding his care.  Briefly discussed AF and need to discuss [place] prior to next appointment with Dr. Erlinda Hong.  Patient verbalized understanding and confirmed his wife's cell number.  Spoke with patient's wife.  Discussed AF and recommendations from Dr. Caryl Comes to schedule [facility] appointment to discuss [facility].  Patient's wife is agreeable to appointment on Wednesday 03/24/16 at 2:30pm.  She is aware of address, phone number, and parking code.  She denies additional questions or concerns at this time and is appreciative of call.

## 2016-03-22 NOTE — Telephone Encounter (Signed)
Rn call patients wife Travis [name] at work. Rn stated the cardiac office has been trying to reach patient and wife. Pts wife stated her husband never answers his phone. Rn stated the cardiac office does not have a DPR form on file at their office. Rn gave number for wife of [phone] for pt to call the cardiac office. Pt does have a loop recorder implant device.Travis [name] verbalized understanding.

## 2016-03-22 NOTE — Telephone Encounter (Signed)
Thank you so much for letting me know. I will try to contact him before next visit with me on 04/02/16.    Rosalin Hawking, MD PhD Stroke Neurology 03/22/2016 6:38 AM

## 2016-03-22 NOTE — Telephone Encounter (Signed)
LFt vm on patients cell number to call [phone] cardiac office to speak with nurse. Per phone note, pt has been call multiple times and was sent a letter in the mail.

## 2016-03-22 NOTE — Telephone Encounter (Signed)
See phone note from 03/02/16.

## 2016-03-22 NOTE — Telephone Encounter (Signed)
Hi, Katrina:  Could you please call this pt and let him to contact cardiology ASAP? He needs stronger blood thinner for stroke prevention, otherwise, he is at high risk for recurrent stroke. Thanks.   Rosalin Hawking, MD PhD Stroke Neurology 03/22/2016 6:40 AM

## 2016-03-24 ENCOUNTER — Ambulatory Visit (HOSPITAL_COMMUNITY)
Admission: RE | Admit: 2016-03-24 | Discharge: 2016-03-24 | Disposition: A | Discharge status: Home / Self Care | Payer: 59 | Source: Ambulatory Visit | Attending: Nurse Practitioner | Admitting: Nurse Practitioner

## 2016-03-24 ENCOUNTER — Encounter (HOSPITAL_COMMUNITY): Payer: Self-pay | Admitting: Nurse Practitioner

## 2016-03-24 VITALS — BP 138/82 | HR 67 | Ht 73.0 in | Wt 178.0 lb

## 2016-03-24 DIAGNOSIS — I480 Paroxysmal atrial fibrillation: Secondary | ICD-10-CM | POA: Diagnosis not present

## 2016-03-24 DIAGNOSIS — Z87891 Personal history of nicotine dependence: Secondary | ICD-10-CM | POA: Diagnosis not present

## 2016-03-24 DIAGNOSIS — Z79899 Other long term (current) drug therapy: Secondary | ICD-10-CM | POA: Insufficient documentation

## 2016-03-24 DIAGNOSIS — Z7984 Long term (current) use of oral hypoglycemic drugs: Secondary | ICD-10-CM | POA: Diagnosis not present

## 2016-03-24 DIAGNOSIS — I10 Essential (primary) hypertension: Secondary | ICD-10-CM | POA: Insufficient documentation

## 2016-03-24 DIAGNOSIS — Z7902 Long term (current) use of antithrombotics/antiplatelets: Secondary | ICD-10-CM | POA: Diagnosis not present

## 2016-03-24 DIAGNOSIS — E119 Type 2 diabetes mellitus without complications: Secondary | ICD-10-CM | POA: Insufficient documentation

## 2016-03-24 DIAGNOSIS — I4891 Unspecified atrial fibrillation: Secondary | ICD-10-CM | POA: Diagnosis present

## 2016-03-24 DIAGNOSIS — E785 Hyperlipidemia, unspecified: Secondary | ICD-10-CM | POA: Diagnosis not present

## 2016-03-24 MED ORDER — APIXABAN 5 MG PO TABS: 5.0000 mg | ORAL_TABLET | Freq: Two times a day (BID) | ORAL | 6 refills | Status: DC

## 2016-03-24 MED ORDER — APIXABAN 5 MG PO TABS: 5.0000 mg | ORAL_TABLET | Freq: Two times a day (BID) | ORAL | 0 refills | Status: DC

## 2016-03-24 MED FILL — ELIQUIS 5 MG TABLET: 5 | 30 days supply | Qty: 60 | Fill #0

## 2016-03-24 NOTE — Progress Notes (Addendum)
Primary Care Physician: [name], MD Referring Physician: [facility]   Travis [name] is a 55 y.o. male with a h/o  HTN, HLD, DM and CVA with last CVA in October of 2016.Linq monitor was implanted at that time. The monitor in October of this year did reveal afib. There was difficulty getting in touch with the pt to get him to come in for anticoagulation.  He is in the afib clinic now for anticoagulation to be addressed. He has been on asa and plavix for this period of time. He had some rectal bleeding and had colonoscopy in October and states that this has been resolved. He states no other bleeding tendencies. He does get off point relating his medical history and I did call wife and discussed anticoagulant with her as well. They would both prefer eliquis. He is unaware of any irregular heart beat  Today, he denies symptoms of palpitations, chest pain, shortness of breath, orthopnea, PND, lower extremity edema, dizziness, presyncope, syncope, or neurologic sequela. The patient is tolerating medications without difficulties and is otherwise without complaint today.   Past Medical History:  Diagnosis Date  . 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 Anxiety   . Arthritis   . Blood transfusion without reported diagnosis   . Chronic headaches   . Complication of anesthesia    bleed after last shoulder-had to stay overnight  . Depression   . Diabetes mellitus   . Diabetic peripheral neuropathy ([place]) 03/28/2013  . Diverticulosis   . Dizziness   . Hernia, inguinal   . Hyperlipidemia   . Hypertension   . Impingement syndrome of left shoulder 08/02/2014  . 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 Neuropathy of lower extremity   . Night sweats    every once in a while  . Sleep apnea    no CPAP  . Stroke ([facility]) 02/2015  . Syncope and collapse    Past Surgical History:  Procedure Laterality Date  . CERVICAL DISC SURGERY    . COLON SURGERY  2003   1/3 removed for  diverticulitis  . COLONOSCOPY    . EP IMPLANTABLE DEVICE N/A 05/01/2015   Procedure: Loop Recorder Insertion;  Surgeon: [name], MD;  Location: [facility];  Service: Cardiovascular;  Laterality: N/A;  . INGUINAL HERNIA REPAIR Bilateral   . KNEE ARTHROSCOPY Right   . SHOULDER ARTHROSCOPY Right    1  . SHOULDER ARTHROSCOPY Left 08/02/2014   Procedure: LEFT SHOULDER SCOPE DEBRIDEMENT/ACROMIOPLASTY;  Surgeon: [name], MD;  Location: [facility];  Service: Orthopedics;  Laterality: Left;  ANESTHESIA: GENERAL, PRE/POST OP SCALENE  . TEE WITHOUT CARDIOVERSION N/A 03/03/2015   Procedure: TRANSESOPHAGEAL ECHOCARDIOGRAM (TEE);  Surgeon: [name], MD;  Location: [facility];  Service: Cardiology;  Laterality: N/A;  . UMBILICAL HERNIA REPAIR     with other hernia repair with mesh  . WRIST SURGERY Right    fusion    Current Outpatient Prescriptions  Medication Sig Dispense Refill  . atorvastatin (LIPITOR) 20 MG tablet TAKE 1 TABLET BY MOUTH ONCE DAILY 90 tablet 1  . baclofen (LIORESAL) 10 MG tablet Take 1 tablet (10 mg total) by mouth 3 (three) times daily. As needed for muscle spasm 50 tablet 0  . Cholecalciferol (VITAMIN D) 400 UNITS capsule Take 400 Units by mouth daily.      . Cyanocobalamin (VITAMIN B12 PO) Take by mouth daily.      . folic acid (FOLVITE)  1 MG tablet TAKE 1 TABLET BY MOUTH DAILY. 90 tablet 1  . glyBURIDE micronized (GLYNASE) 3 MG tablet Take 1/2 tablet by mouth every morning. 45 tablet 1  . lisinopril (PRINIVIL,ZESTRIL) 2.5 MG tablet Take 1 tablet (2.5 mg total) by mouth every morning. 90 tablet 1  . metFORMIN (GLUCOPHAGE) 500 MG tablet TAKE 1 TABLETS BY MOUTH 2 TIMES DAILY WITH A MEAL. 180 tablet 1  . pregabalin (LYRICA) 100 MG capsule Take 1 capsule (100 mg total) by mouth 3 (three) times daily. 270 capsule 0  . topiramate (TOPAMAX) 25 MG tablet TAKE 2 TABLETS BY MOUTH TWICE DAILY 120 tablet 11  . traMADol (ULTRAM) 50 MG tablet  Take 1 tablet (50 mg total) by mouth 3 (three) times daily as needed for moderate pain. 35 tablet 5  . apixaban (ELIQUIS) 5 MG TABS tablet Take 1 tablet (5 mg total) by mouth 2 (two) times daily. 60 tablet 0  . apixaban (ELIQUIS) 5 MG TABS tablet Take 1 tablet (5 mg total) by mouth 2 (two) times daily. 60 tablet 6  . valACYclovir (VALTREX) 1000 MG tablet TAKE 1 TABLET BY MOUTH DAILY. 90 tablet PRN   Current Facility-Administered Medications  Medication Dose Route Frequency Provider Last Rate Last Dose  . 0.9 %  sodium chloride infusion  500 mL Intravenous Continuous Irene Shipper, MD        No Known Allergies  Social History   Social History  . Marital status: Married    Spouse name: N/A  . Number of children: 2  . Years of education: College   Occupational History  . [employer]     Social History Main Topics  . Smoking status: Former Smoker    Packs/day: 1.00    Years: 8.00    Types: Cigarettes    Start date: 06/07/1970    Quit date: 05/03/1978  . Smokeless tobacco: Former Systems developer    Types: Chew    Quit date: 05/03/1978  . Alcohol use No     Comment: occasional shot of taquila  . Drug use: No  . Sexual activity: Not on file   Other Topics Concern  . Not on file   Social History Narrative   Drinks some coffee, Drink diet sodas and tea    Family History  Problem Relation Age of Onset  . Stroke Father   . Hyperlipidemia Father   . Heart attack Sister 53  . Dementia Mother   . Diabetes Maternal Grandfather   . ALS Brother 22  . Colon cancer Neg Hx   . Esophageal cancer Neg Hx   . Stomach cancer Neg Hx   . Rectal cancer Neg Hx     ROS- All systems are reviewed and negative except as per the HPI above  Physical Exam: Vitals:   03/24/16 1413  BP: 138/82  Pulse: 67  Weight: 178 lb (80.7 kg)  Height: 6\' 1"  (1.854 m)    GEN- The patient is well appearing, alert and oriented x 3 today.   Head- normocephalic, atraumatic Eyes-  Sclera clear, conjunctiva  pink Ears- hearing intact Oropharynx- clear Neck- supple, no JVP Lymph- no cervical lymphadenopathy Lungs- Clear to ausculation bilaterally, normal work of breathing Heart- Regular rate and rhythm, no murmurs, rubs or gallops, PMI not laterally displaced GI- soft, NT, ND, + BS Extremities- no clubbing, cyanosis, or edema MS- no significant deformity or atrophy Skin- no rash or lesion Psych- euthymic mood, full affect Neuro- strength and sensation are intact  EKG-NSR, normal ekg 67 bpm Echo- 03/01/15-Study Conclusions  - Left ventricle: The cavity size was normal. There was mild focal   basal hypertrophy of the septum. Systolic function was normal.   The estimated ejection fraction was in the range of 50% to 55%.   Inferior hypokinesis, Doppler parameters are consistent with   abnormal left ventricular relaxation (grade 1 diastolic   dysfunction). The E/e&' ratio is <8, suggesting normal LV filling   pressure. - Aorta: Aortic root dimension: 40 mm (ED). - Aortic root: The aortic root is dilated. - Left atrium: The atrium was normal in size. - Tricuspid valve: There was trivial regurgitation.  Impressions:  - Compared to a prior study in 10/2014, the EF may be slightly   higher at 50-55%, inferior wall motion abnormality, there is mild   aortic root dilitation to 4.1 cm.  Labs- 02/26/16- Creatinine 0.94, CBC wnl Assessment and Plan:    1.PAF, s/p CVA 10/16 Stop asa and plavix Start eliquis 5 mg bid for CHA2DS2VASc score of 2(prior stroke) 30 day free supply given Bleeding precautions discussed with pt and wife Asymptomatic with afib,  one episode of afib 10/29 at 6: 50 am with v rate at 80 -100 bpm. No treatment of afib for now other thant to start DOAC  F/u in one month F/u with Dr. Erlinda Hong 12/1  Geroge Baseman. Meryem Haertel, [facility] [address] [place], [place] [address] [phone]

## 2016-03-24 NOTE — Addendum Note (Signed)
Encounter addended by: Sherran Needs, NP on: 03/24/2016  4:34 PM<BR>    Actions taken: Sign clinical note

## 2016-03-24 NOTE — Patient Instructions (Signed)
Your physician has recommended you make the following change in your medication:  1)Start Eliquis 5mg  twice a day -- Starting 11/23 2)Stop aspirin 3)Stop plavix

## 2016-03-27 LAB — CUP PACEART REMOTE DEVICE CHECK
Implantable Pulse Generator Implant Date: 20161229
MDC IDC SESS DTM: 20171025153752

## 2016-03-27 NOTE — Progress Notes (Signed)
Carelink summary report received. Battery status OK. Normal device function. No new symptom episodes, tachy episodes, brady, or pause episodes. No new AF episodes. Monthly summary reports and ROV/PRN 

## 2016-03-29 ENCOUNTER — Ambulatory Visit (INDEPENDENT_AMBULATORY_CARE_PROVIDER_SITE_OTHER): Payer: 59 | Admitting: *Deleted

## 2016-03-29 DIAGNOSIS — I63443 Cerebral infarction due to embolism of bilateral cerebellar arteries: Secondary | ICD-10-CM

## 2016-03-30 NOTE — Progress Notes (Signed)
[facility] Summary Report / Loop Recorder 

## 2016-04-02 ENCOUNTER — Ambulatory Visit (INDEPENDENT_AMBULATORY_CARE_PROVIDER_SITE_OTHER): Payer: 59 | Admitting: Neurology

## 2016-04-02 ENCOUNTER — Encounter: Payer: Self-pay | Admitting: Neurology

## 2016-04-02 VITALS — BP 116/78 | HR 84 | Wt 179.4 lb

## 2016-04-02 DIAGNOSIS — I10 Essential (primary) hypertension: Secondary | ICD-10-CM | POA: Diagnosis not present

## 2016-04-02 DIAGNOSIS — Q211 Atrial septal defect: Secondary | ICD-10-CM | POA: Diagnosis not present

## 2016-04-02 DIAGNOSIS — I480 Paroxysmal atrial fibrillation: Secondary | ICD-10-CM

## 2016-04-02 DIAGNOSIS — I6340 Cerebral infarction due to embolism of unspecified cerebral artery: Secondary | ICD-10-CM | POA: Diagnosis not present

## 2016-04-02 DIAGNOSIS — E1159 Type 2 diabetes mellitus with other circulatory complications: Secondary | ICD-10-CM

## 2016-04-02 DIAGNOSIS — Q2112 Patent foramen ovale: Secondary | ICD-10-CM
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
## 2016-04-02 NOTE — Patient Instructions (Signed)
-   continue eliquis and lipitor for stroke prevention  - continue home exercise and speech exercise - Follow up with your primary care physician for stroke risk factor modification. Recommend maintain blood pressure goal <130/80, diabetes with hemoglobin A1c goal below 6.5% and lipids with LDL cholesterol goal below 70 mg/dL.  - continue topamax to 50mg  bid for headache prevention - check BP and glucose at home and record - follow up in 6 months.

## 2016-04-07 MED FILL — traMADol HCL 50 MG TABS: 50 | 12 days supply | Qty: 35 | Fill #1

## 2016-04-19 ENCOUNTER — Ambulatory Visit (INDEPENDENT_AMBULATORY_CARE_PROVIDER_SITE_OTHER): Payer: 59 | Admitting: Family Medicine

## 2016-04-19 VITALS — BP 114/70 | Wt 181.0 lb

## 2016-04-19 DIAGNOSIS — E1159 Type 2 diabetes mellitus with other circulatory complications: Secondary | ICD-10-CM | POA: Diagnosis not present

## 2016-04-19 DIAGNOSIS — E784 Other hyperlipidemia: Secondary | ICD-10-CM

## 2016-04-19 DIAGNOSIS — I10 Essential (primary) hypertension: Secondary | ICD-10-CM | POA: Diagnosis not present

## 2016-04-19 DIAGNOSIS — E7849 Other hyperlipidemia: Secondary | ICD-10-CM

## 2016-04-19 LAB — POCT GLYCOSYLATED HEMOGLOBIN (HGB A1C): Hemoglobin A1C: 5.4

## 2016-04-19 NOTE — Patient Instructions (Signed)
Stop glyburide  Follow up in 3 months  A1C is 5.4 good!!!

## 2016-04-19 NOTE — Progress Notes (Signed)
Subjective:    Patient ID: Travis [name], male    DOB: Sep 22, 1960, 55 y.o.   MRN: [id number]  HPI   Patient in office today to follow-up on diabetes management.  Patient states he is doing well at this time. This patient has not had any symptoms consistent with a stroke he is had a history of a stroke He states his sugars overall been doing good denies a low spells but states occasionally gets dizzy Patient relates he takes his cholesterol medicine watch his diet as best he can Does try to stay active but unable to rule out excise Recently cardiologist switched him from Plavix and aspirin over 10 Eliquis to try to prevent any type of future strokes Patient denies any rectal bleeding runny nose fever chills sweats   Review of Systems  Constitutional: Negative for activity change, appetite change and fatigue.  HENT: Negative for congestion.   Respiratory: Negative for cough.   Cardiovascular: Negative for chest pain.  Gastrointestinal: Negative for abdominal pain.  Endocrine: Negative for polydipsia and polyphagia.  Neurological: Negative for weakness.  Psychiatric/Behavioral: Negative for confusion.       Objective:   Physical Exam  Constitutional: He appears well-nourished. No distress.  Cardiovascular: Normal rate, regular rhythm and normal heart sounds.   No murmur heard. Pulmonary/Chest: Effort normal and breath sounds normal. No respiratory distress.  Musculoskeletal: He exhibits no edema.  Lymphadenopathy:    He has no cervical adenopathy.  Neurological: He is alert.  Psychiatric: His behavior is normal.  Vitals reviewed.         Assessment & Plan:  The patient was seen today as part of a comprehensive visit for diabetes. The importance of keeping her A1c at or below 7 was discussed. Importance of regular physical activity was discussed. Proper monitoring of glucose levels with glucometer discussed. The importance of adherence to medication as well as a  controlled low starch/sugar diet was also discussed. Also discussion regarding the importance of diabetic foot checks including self check every day. Also yearly diabetic eye exams recommended. The importance of keeping blood pressure under control and keeping LDL below 70 was also discussed. Also the importance of avoiding smoking. Standard follow-up visit recommended. Finally failure to follow good diabetic measures including self effort and compliance with recommendations can certainly increase the risk of heart disease strokes kidney failure blindness loss of limb and early death was discussed with the patient.   HTN- Patient was seen today as part of a visit regarding hypertension. The importance of healthy diet and regular physical activity was discussed. The importance of compliance with medications discussed. Ideal goal is to keep blood pressure low elevated levels certainly below [id number] when possible. The patient was counseled that keeping blood pressure under control lessen his risk of heart attack, stroke, kidney failure, and early death. The importance of regular follow-ups was discussed with the patient. Low-salt diet such as DASH recommended. Regular physical activity was recommended as well. Patient was advised to keep regular follow-ups.  The patient was seen today as part of an evaluation regarding hyperlipidemia. Recent lab work has been reviewed with the patient as well as the goals for good cholesterol care. In addition to this medications have been discussed the importance of compliance with diet and medications discussed as well. Patient has been informed of potential side effects of medications in the importance to notify us should any problems occur. Finally the patient is aware that poor control of cholesterol, noncompliance can dramatically  increase her risk of heart attack strokes and premature death. The patient will keep regular office visits and the patient does agreed to periodic  lab work. Patient's A1c looks really good therefore we will stop the globe Uriah to reduce her risk of accidental low sugar spell Follow-up patient in 3-4 months No sign of any stroke currently lab work ordered previous labs reviewed with patient including cholesterol liver await the results of these labs.

## 2016-04-22 ENCOUNTER — Ambulatory Visit (HOSPITAL_COMMUNITY)
Admission: RE | Admit: 2016-04-22 | Discharge: 2016-04-22 | Disposition: A | Discharge status: Home / Self Care | Payer: 59 | Source: Ambulatory Visit | Attending: Nurse Practitioner | Admitting: Nurse Practitioner

## 2016-04-22 ENCOUNTER — Encounter (HOSPITAL_COMMUNITY): Payer: Self-pay | Admitting: Nurse Practitioner

## 2016-04-22 VITALS — BP 140/78 | HR 103 | Ht 73.0 in | Wt 187.0 lb

## 2016-04-22 DIAGNOSIS — I480 Paroxysmal atrial fibrillation: Secondary | ICD-10-CM | POA: Diagnosis not present

## 2016-04-22 DIAGNOSIS — I4891 Unspecified atrial fibrillation: Secondary | ICD-10-CM | POA: Diagnosis present

## 2016-04-22 DIAGNOSIS — R9431 Abnormal electrocardiogram [ECG] [EKG]: Secondary | ICD-10-CM | POA: Diagnosis not present

## 2016-04-22 DIAGNOSIS — R000 Tachycardia, unspecified: Secondary | ICD-10-CM | POA: Insufficient documentation

## 2016-04-22 MED ORDER — APIXABAN 5 MG PO TABS: 5.0000 mg | ORAL_TABLET | Freq: Two times a day (BID) | ORAL | 6 refills | Status: DC

## 2016-04-22 MED FILL — ELIQUIS 5 MG TABLET: 5 | 30 days supply | Qty: 60 | Fill #0

## 2016-04-22 NOTE — Progress Notes (Signed)
Primary Care Physician: [name], MD Referring Physician: Device clinic [facility]   Travis [name] is a 55 y.o. male with a h/o  HTN, HLD, DM Travis CVA with last CVA in October of 2016.Linq monitor was implanted at that time. The monitor in October of this year did reveal afib. There was difficulty getting in touch with the pt to get him to come in for anticoagulation.  He is in the afib clinic now for anticoagulation to be addressed. He has been on asa Travis plavix for this period of time. He had some rectal bleeding Travis had colonoscopy in October Travis states that this has been resolved. He states no other bleeding tendencies. He does get off point relating his medical history Travis I did call wife Travis discussed anticoagulant with her as well. They would both prefer eliquis. He is unaware of any irregular heart beat.  F/u in afib clinic 12/21, he is doing well on eliquis. No signs of bleeding. No awareness of irregular heart beat.He is due for a remote check 12/26.  Today, he denies symptoms of palpitations, chest pain, shortness of breath, orthopnea, PND, lower extremity edema, dizziness, presyncope, syncope, or neurologic sequela. The patient is tolerating medications without difficulties Travis is otherwise without complaint today.   Past Medical History:  Diagnosis Date  . 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 Anxiety   . Arthritis   . Blood transfusion without reported diagnosis   . Chronic headaches   . Complication of anesthesia    bleed after last shoulder-had to stay overnight  . Depression   . Diabetes mellitus   . Diabetic peripheral neuropathy ([place]) 03/28/2013  . Diverticulosis   . Dizziness   . Hernia, inguinal   . Hyperlipidemia   . Hypertension   . Impingement syndrome of left shoulder 08/02/2014  . 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 Neuropathy of lower extremity   . Night sweats    every once in a while  . Sleep apnea    no CPAP  . Stroke ([place]) 02/2015  .  Syncope Travis collapse    Past Surgical History:  Procedure Laterality Date  . CERVICAL DISC SURGERY    . COLON SURGERY  2003   1/3 removed for diverticulitis  . COLONOSCOPY    . EP IMPLANTABLE DEVICE N/A 05/01/2015   Procedure: Loop Recorder Insertion;  Surgeon: [name], MD;  Location: [facility];  Service: Cardiovascular;  Laterality: N/A;  . INGUINAL HERNIA REPAIR Bilateral   . KNEE ARTHROSCOPY Right   . SHOULDER ARTHROSCOPY Right    1  . SHOULDER ARTHROSCOPY Left 08/02/2014   Procedure: LEFT SHOULDER SCOPE DEBRIDEMENT/ACROMIOPLASTY;  Surgeon: [name], MD;  Location: [facility];  Service: Orthopedics;  Laterality: Left;  ANESTHESIA: GENERAL, PRE/POST OP SCALENE  . TEE WITHOUT CARDIOVERSION N/A 03/03/2015   Procedure: TRANSESOPHAGEAL ECHOCARDIOGRAM (TEE);  Surgeon: [name], MD;  Location[facility];  Service: Cardiology;  Laterality: N/A;  . UMBILICAL HERNIA REPAIR     with other hernia repair with mesh  . WRIST SURGERY Right    fusion    Current Outpatient Prescriptions  Medication Sig Dispense Refill  . apixaban (ELIQUIS) 5 MG TABS tablet Take 1 tablet (5 mg total) by mouth 2 (two) times daily. 60 tablet 6  . atorvastatin (LIPITOR) 20 MG tablet TAKE 1 TABLET BY MOUTH ONCE DAILY 90 tablet 1  . baclofen (LIORESAL) 10 MG tablet Take 1 tablet (10 mg  total) by mouth 3 (three) times daily. As needed for muscle spasm 50 tablet 0  . Cholecalciferol (VITAMIN D) 400 UNITS capsule Take 400 Units by mouth daily.      . Cyanocobalamin (VITAMIN B12 PO) Take by mouth daily.      . folic acid (FOLVITE) 1 MG tablet TAKE 1 TABLET BY MOUTH DAILY. 90 tablet 1  . lisinopril (PRINIVIL,ZESTRIL) 2.5 MG tablet Take 1 tablet (2.5 mg total) by mouth every morning. 90 tablet 1  . metFORMIN (GLUCOPHAGE) 500 MG tablet TAKE 1 TABLETS BY MOUTH 2 TIMES DAILY WITH A MEAL. 180 tablet 1  . pregabalin (LYRICA) 100 MG capsule Take 1 capsule (100 mg total) by mouth 3  (three) times daily. 270 capsule 0  . topiramate (TOPAMAX) 25 MG tablet TAKE 2 TABLETS BY MOUTH TWICE DAILY 120 tablet 11  . traMADol (ULTRAM) 50 MG tablet Take 1 tablet (50 mg total) by mouth 3 (three) times daily as needed for moderate pain. 35 tablet 5  . valACYclovir (VALTREX) 1000 MG tablet TAKE 1 TABLET BY MOUTH DAILY. 90 tablet PRN   No current facility-administered medications for this encounter.     No Known Allergies  Social History   Social History  . Marital status: Married    Spouse name: N/A  . Number of children: 2  . Years of education: College   Occupational History  . [employer]     Social History Main Topics  . Smoking status: Former Smoker    Packs/day: 1.00    Years: 8.00    Types: Cigarettes    Start date: 06/07/1970    Quit date: 05/03/1978  . Smokeless tobacco: Former Systems developer    Types: Chew    Quit date: 05/03/1978  . Alcohol use No     Comment: occasional shot of taquila  . Drug use: No  . Sexual activity: Not on file   Other Topics Concern  . Not on file   Social History Narrative   Drinks some coffee, Drink diet sodas Travis tea    Family History  Problem Relation Age of Onset  . Stroke Father   . Hyperlipidemia Father   . Heart attack Sister 53  . Dementia Mother   . Diabetes Maternal Grandfather   . ALS Brother 70  . Colon cancer Neg Hx   . Esophageal cancer Neg Hx   . Stomach cancer Neg Hx   . Rectal cancer Neg Hx     ROS- All systems are reviewed Travis negative except as per the HPI above  Physical Exam: Vitals:   04/22/16 1100  BP: 140/78  Pulse: (!) 103  Weight: 187 lb (84.8 kg)  Height: 6\' 1"  (1.854 m)    GEN- The patient is well appearing, alert Travis oriented x 3 today.   Head- normocephalic, atraumatic Eyes-  Sclera clear, conjunctiva pink Ears- hearing intact Oropharynx- clear Neck- supple, no JVP Lymph- no cervical lymphadenopathy Lungs- Clear to ausculation bilaterally, normal work of breathing Heart- Regular  rate Travis rhythm, no murmurs, rubs or gallops, PMI not laterally displaced GI- soft, NT, ND, + BS Extremities- no clubbing, cyanosis, or edema MS- no significant deformity or atrophy Skin- no rash or lesion Psych- euthymic mood, full affect Neuro- strength Travis sensation are intact  EKG-NSR, normal ekg 67 bpm Echo- 03/01/15-Study Conclusions  - Left ventricle: The cavity size was normal. There was mild focal   basal hypertrophy of the septum. Systolic function was normal.   The estimated  ejection fraction was in the range of 50% to 55%.   Inferior hypokinesis, Doppler parameters are consistent with   abnormal left ventricular relaxation (grade 1 diastolic   dysfunction). The E/e&' ratio is <8, suggesting normal LV filling   pressure. - Aorta: Aortic root dimension: 40 mm (ED). - Aortic root: The aortic root is dilated. - Left atrium: The atrium was normal in size. - Tricuspid valve: There was trivial regurgitation.  Impressions:  - Compared to a prior study in 10/2014, the EF may be slightly   higher at 50-55%, inferior wall motion abnormality, there is mild   aortic root dilitation to 4.1 cm.  Labs- 02/26/16- Creatinine 0.94, CBC wnl Assessment Travis Plan:    1.PAF, s/p CVA 10/16 Off asa Travis plavix Continue eliquis 5 mg bid for CHA2DS2VASc score of 2(prior stroke) Rx sent in for continued use Bleeding precautions discussed with pt again Asymptomatic with afib,  one episode of afib reported pr linq  10/29 at 6: 50 am with v rate at 80 -100 bpm Continue with linq, remote check 12/26 No treatment of afib for now, afib burden low Travis asymptomatic   F/u afib clinic as needed F/u with Dr. Erlinda Hong  10/11/16  Geroge Baseman. Ervine Witucki, [facility] [address] [place], [place] [address] [phone]

## 2016-04-27 ENCOUNTER — Ambulatory Visit (INDEPENDENT_AMBULATORY_CARE_PROVIDER_SITE_OTHER): Payer: 59 | Admitting: *Deleted

## 2016-04-27 DIAGNOSIS — I63443 Cerebral infarction due to embolism of bilateral cerebellar arteries: Secondary | ICD-10-CM

## 2016-04-28 NOTE — Progress Notes (Signed)
[facility] Summary Report / Loop Recorder 

## 2016-04-30 MED FILL — traMADol HCL 50 MG TABS: 50 | 12 days supply | Qty: 35 | Fill #2

## 2016-05-02 ENCOUNTER — Observation Stay (HOSPITAL_COMMUNITY): Payer: 59

## 2016-05-02 ENCOUNTER — Emergency Department (HOSPITAL_COMMUNITY): Payer: 59

## 2016-05-02 ENCOUNTER — Inpatient Hospital Stay (HOSPITAL_COMMUNITY)
Admission: EM | Admit: 2016-05-02 | Discharge: 2016-05-06 | DRG: 065 | Disposition: A | Discharge status: Home Health | Payer: 59 | Attending: Nephrology | Admitting: Nephrology

## 2016-05-02 ENCOUNTER — Encounter (HOSPITAL_COMMUNITY): Payer: Self-pay | Admitting: *Deleted

## 2016-05-02 DIAGNOSIS — J210 Acute bronchiolitis due to respiratory syncytial virus: Secondary | ICD-10-CM | POA: Diagnosis not present

## 2016-05-02 DIAGNOSIS — G4733 Obstructive sleep apnea (adult) (pediatric): Secondary | ICD-10-CM | POA: Diagnosis present

## 2016-05-02 DIAGNOSIS — E119 Type 2 diabetes mellitus without complications: Secondary | ICD-10-CM

## 2016-05-02 DIAGNOSIS — E782 Mixed hyperlipidemia: Secondary | ICD-10-CM | POA: Diagnosis present

## 2016-05-02 DIAGNOSIS — B974 Respiratory syncytial virus as the cause of diseases classified elsewhere: Secondary | ICD-10-CM | POA: Diagnosis present

## 2016-05-02 DIAGNOSIS — Z7984 Long term (current) use of oral hypoglycemic drugs: Secondary | ICD-10-CM

## 2016-05-02 DIAGNOSIS — E1165 Type 2 diabetes mellitus with hyperglycemia: Secondary | ICD-10-CM | POA: Diagnosis not present

## 2016-05-02 DIAGNOSIS — G459 Transient cerebral ischemic attack, unspecified: Secondary | ICD-10-CM

## 2016-05-02 DIAGNOSIS — R531 Weakness: Secondary | ICD-10-CM

## 2016-05-02 DIAGNOSIS — Z823 Family history of stroke: Secondary | ICD-10-CM

## 2016-05-02 DIAGNOSIS — E1159 Type 2 diabetes mellitus with other circulatory complications: Secondary | ICD-10-CM

## 2016-05-02 DIAGNOSIS — Z8249 Family history of ischemic heart disease and other diseases of the circulatory system: Secondary | ICD-10-CM

## 2016-05-02 DIAGNOSIS — I639 Cerebral infarction, unspecified: Secondary | ICD-10-CM | POA: Diagnosis not present

## 2016-05-02 DIAGNOSIS — F32A Depression, unspecified: Secondary | ICD-10-CM | POA: Diagnosis present

## 2016-05-02 DIAGNOSIS — R42 Dizziness and giddiness: Secondary | ICD-10-CM

## 2016-05-02 DIAGNOSIS — Z79899 Other long term (current) drug therapy: Secondary | ICD-10-CM

## 2016-05-02 DIAGNOSIS — R509 Fever, unspecified: Secondary | ICD-10-CM | POA: Diagnosis not present

## 2016-05-02 DIAGNOSIS — I69354 Hemiplegia and hemiparesis following cerebral infarction affecting left non-dominant side: Secondary | ICD-10-CM

## 2016-05-02 DIAGNOSIS — I10 Essential (primary) hypertension: Secondary | ICD-10-CM | POA: Diagnosis not present

## 2016-05-02 DIAGNOSIS — E1142 Type 2 diabetes mellitus with diabetic polyneuropathy: Secondary | ICD-10-CM | POA: Diagnosis present

## 2016-05-02 DIAGNOSIS — Z8673 Personal history of transient ischemic attack (TIA), and cerebral infarction without residual deficits: Secondary | ICD-10-CM

## 2016-05-02 DIAGNOSIS — R2681 Unsteadiness on feet: Secondary | ICD-10-CM

## 2016-05-02 DIAGNOSIS — F329 Major depressive disorder, single episode, unspecified: Secondary | ICD-10-CM | POA: Diagnosis present

## 2016-05-02 DIAGNOSIS — F419 Anxiety disorder, unspecified: Secondary | ICD-10-CM | POA: Diagnosis present

## 2016-05-02 DIAGNOSIS — I69328 Other speech and language deficits following cerebral infarction: Secondary | ICD-10-CM

## 2016-05-02 DIAGNOSIS — I480 Paroxysmal atrial fibrillation: Secondary | ICD-10-CM | POA: Diagnosis not present

## 2016-05-02 DIAGNOSIS — Z833 Family history of diabetes mellitus: Secondary | ICD-10-CM

## 2016-05-02 DIAGNOSIS — J069 Acute upper respiratory infection, unspecified: Secondary | ICD-10-CM | POA: Diagnosis present

## 2016-05-02 DIAGNOSIS — M545 Low back pain: Secondary | ICD-10-CM | POA: Diagnosis present

## 2016-05-02 DIAGNOSIS — E785 Hyperlipidemia, unspecified: Secondary | ICD-10-CM | POA: Diagnosis present

## 2016-05-02 DIAGNOSIS — R270 Ataxia, unspecified: Secondary | ICD-10-CM | POA: Diagnosis present

## 2016-05-02 DIAGNOSIS — G8929 Other chronic pain: Secondary | ICD-10-CM | POA: Diagnosis present

## 2016-05-02 HISTORY — DX: Unspecified atrial fibrillation: I48.91

## 2016-05-02 LAB — DIFFERENTIAL
Basophils Absolute: 0 10*3/uL (ref 0.0–0.1)
Basophils Relative: 0 %
EOS ABS: 0.1 10*3/uL (ref 0.0–0.7)
EOS PCT: 1 %
LYMPHS ABS: 1 10*3/uL (ref 0.7–4.0)
LYMPHS PCT: 15 %
MONOS PCT: 8 %
Monocytes Absolute: 0.5 10*3/uL (ref 0.1–1.0)
Neutro Abs: 5.3 10*3/uL (ref 1.7–7.7)
Neutrophils Relative %: 76 %

## 2016-05-02 LAB — I-STAT CHEM 8, ED
BUN: 10 mg/dL (ref 6–20)
CALCIUM ION: 1.2 mmol/L (ref 1.15–1.40)
CREATININE: 0.9 mg/dL (ref 0.61–1.24)
Chloride: 105 mmol/L (ref 101–111)
Glucose, Bld: 286 mg/dL — ABNORMAL HIGH (ref 65–99)
HEMATOCRIT: 41 % (ref 39.0–52.0)
HEMOGLOBIN: 13.9 g/dL (ref 13.0–17.0)
Potassium: 3.7 mmol/L (ref 3.5–5.1)
Sodium: 141 mmol/L (ref 135–145)
TCO2: 23 mmol/L (ref 0–100)

## 2016-05-02 LAB — COMPREHENSIVE METABOLIC PANEL
ALK PHOS: 62 U/L (ref 38–126)
ALT: 14 U/L — ABNORMAL LOW (ref 17–63)
AST: 21 U/L (ref 15–41)
Albumin: 4.1 g/dL (ref 3.5–5.0)
Anion gap: 6 (ref 5–15)
BILIRUBIN TOTAL: 1 mg/dL (ref 0.3–1.2)
BUN: 11 mg/dL (ref 6–20)
CALCIUM: 9 mg/dL (ref 8.9–10.3)
CO2: 24 mmol/L (ref 22–32)
Chloride: 106 mmol/L (ref 101–111)
Creatinine, Ser: 0.9 mg/dL (ref 0.61–1.24)
GFR calc Af Amer: >60 mL/min (ref >60)
Glucose, Bld: 289 mg/dL — ABNORMAL HIGH (ref 65–99)
POTASSIUM: 3.7 mmol/L (ref 3.5–5.1)
Sodium: 136 mmol/L (ref 135–145)
TOTAL PROTEIN: 7.2 g/dL (ref 6.5–8.1)

## 2016-05-02 LAB — CBG MONITORING, ED: GLUCOSE-CAPILLARY: 299 mg/dL — AB (ref 65–99)

## 2016-05-02 LAB — PROTIME-INR
INR: 1.15
Prothrombin Time: 14.8 seconds (ref 11.4–15.2)

## 2016-05-02 LAB — CBC
HEMATOCRIT: 42 % (ref 39.0–52.0)
HEMOGLOBIN: 14.7 g/dL (ref 13.0–17.0)
MCH: 33.2 pg (ref 26.0–34.0)
MCHC: 35 g/dL (ref 30.0–36.0)
MCV: 94.8 fL (ref 78.0–100.0)
Platelets: 169 10*3/uL (ref 150–400)
RBC: 4.43 MIL/uL (ref 4.22–5.81)
RDW: 12.1 % (ref 11.5–15.5)
WBC: 7 10*3/uL (ref 4.0–10.5)

## 2016-05-02 LAB — GLUCOSE, CAPILLARY: GLUCOSE-CAPILLARY: 129 mg/dL — AB (ref 65–99)

## 2016-05-02 LAB — TROPONIN I: Troponin I: <0.03 ng/mL (ref <0.03)

## 2016-05-02 LAB — APTT: aPTT: 30 seconds (ref 24–36)

## 2016-05-02 LAB — TSH: TSH: 0.72 u[IU]/mL (ref 0.350–4.500)

## 2016-05-02 MED ORDER — LORAZEPAM 2 MG/ML IJ SOLN
1.0000 mg | Freq: Once | INTRAMUSCULAR | Status: AC
  Administered 2016-05-02: 1 mg via INTRAVENOUS
  Filled 2016-05-02: qty 1

## 2016-05-02 MED ORDER — ASPIRIN 300 MG RE SUPP
300.0000 mg | Freq: Every day | RECTAL | Status: DC
  Filled 2016-05-02: qty 1

## 2016-05-02 MED ORDER — IOPAMIDOL (ISOVUE-370) INJECTION 76%
75.0000 mL | Freq: Once | INTRAVENOUS | Status: AC | PRN
  Administered 2016-05-02: 75 mL via INTRAVENOUS

## 2016-05-02 MED ORDER — STROKE: EARLY STAGES OF RECOVERY BOOK
Freq: Once | Status: AC
  Administered 2016-05-03: 08:00:00
  Filled 2016-05-02: qty 1

## 2016-05-02 MED ORDER — TRAMADOL HCL 50 MG PO TABS
50.0000 mg | ORAL_TABLET | Freq: Three times a day (TID) | ORAL | Status: DC | PRN
  Administered 2016-05-02 – 2016-05-03 (×2): 50 mg via ORAL
  Filled 2016-05-02 (×2): qty 1

## 2016-05-02 MED ORDER — INSULIN ASPART 100 UNIT/ML ~~LOC~~ SOLN
0.0000 [IU] | Freq: Every day | SUBCUTANEOUS | Status: DC
  Administered 2016-05-04: 2 [IU] via SUBCUTANEOUS
  Administered 2016-05-05: 3 [IU] via SUBCUTANEOUS

## 2016-05-02 MED ORDER — SODIUM CHLORIDE 0.9 % IV SOLN
INTRAVENOUS | Status: DC
  Administered 2016-05-02 – 2016-05-06 (×3): via INTRAVENOUS

## 2016-05-02 MED ORDER — ASPIRIN 325 MG PO TABS
325.0000 mg | ORAL_TABLET | Freq: Every day | ORAL | Status: DC
  Administered 2016-05-02 – 2016-05-06 (×4): 325 mg via ORAL
  Filled 2016-05-02 (×5): qty 1

## 2016-05-02 MED ORDER — TOPIRAMATE 25 MG PO TABS
50.0000 mg | ORAL_TABLET | Freq: Two times a day (BID) | ORAL | Status: DC
  Administered 2016-05-02 – 2016-05-06 (×8): 50 mg via ORAL
  Filled 2016-05-02 (×8): qty 2

## 2016-05-02 MED ORDER — FOLIC ACID 1 MG PO TABS
1.0000 mg | ORAL_TABLET | Freq: Every day | ORAL | Status: DC
  Administered 2016-05-03 – 2016-05-06 (×4): 1 mg via ORAL
  Filled 2016-05-02 (×4): qty 1

## 2016-05-02 MED ORDER — ACETAMINOPHEN 650 MG RE SUPP: 650.0000 mg | RECTAL | Status: DC | PRN

## 2016-05-02 MED ORDER — INSULIN ASPART 100 UNIT/ML ~~LOC~~ SOLN
0.0000 [IU] | Freq: Three times a day (TID) | SUBCUTANEOUS | Status: DC
  Administered 2016-05-03 (×3): 3 [IU] via SUBCUTANEOUS
  Administered 2016-05-04: 11 [IU] via SUBCUTANEOUS
  Administered 2016-05-04 – 2016-05-05 (×4): 3 [IU] via SUBCUTANEOUS
  Administered 2016-05-05 – 2016-05-06 (×2): 8 [IU] via SUBCUTANEOUS
  Administered 2016-05-06 (×2): 3 [IU] via SUBCUTANEOUS

## 2016-05-02 MED ORDER — ATORVASTATIN CALCIUM 40 MG PO TABS
40.0000 mg | ORAL_TABLET | Freq: Every day | ORAL | Status: DC
  Administered 2016-05-02 – 2016-05-06 (×5): 40 mg via ORAL
  Filled 2016-05-02 (×5): qty 1

## 2016-05-02 MED ORDER — APIXABAN 5 MG PO TABS
5.0000 mg | ORAL_TABLET | Freq: Two times a day (BID) | ORAL | Status: DC
  Administered 2016-05-02 – 2016-05-03 (×3): 5 mg via ORAL
  Filled 2016-05-02 (×3): qty 1

## 2016-05-02 MED ORDER — ACETAMINOPHEN 160 MG/5ML PO SOLN: 650.0000 mg | ORAL | Status: DC | PRN

## 2016-05-02 MED ORDER — VALACYCLOVIR HCL 500 MG PO TABS
1000.0000 mg | ORAL_TABLET | Freq: Every day | ORAL | Status: DC
  Administered 2016-05-02 – 2016-05-06 (×5): 1000 mg via ORAL
  Filled 2016-05-02 (×6): qty 2

## 2016-05-02 MED ORDER — VALACYCLOVIR HCL 500 MG PO TABS
ORAL_TABLET | ORAL | Status: AC
  Filled 2016-05-02: qty 2

## 2016-05-02 MED ORDER — ACETAMINOPHEN 325 MG PO TABS
650.0000 mg | ORAL_TABLET | ORAL | Status: DC | PRN
  Administered 2016-05-04: 650 mg via ORAL
  Filled 2016-05-02: qty 2

## 2016-05-02 MED ORDER — PREGABALIN 50 MG PO CAPS
100.0000 mg | ORAL_CAPSULE | Freq: Three times a day (TID) | ORAL | Status: DC
  Administered 2016-05-02 – 2016-05-06 (×12): 100 mg via ORAL
  Filled 2016-05-02 (×12): qty 2

## 2016-05-02 MED ORDER — SENNOSIDES-DOCUSATE SODIUM 8.6-50 MG PO TABS: 1.0000 | ORAL_TABLET | Freq: Every evening | ORAL | Status: DC | PRN

## 2016-05-02 MED ORDER — SODIUM CHLORIDE 0.9 % IV SOLN: INTRAVENOUS | Status: AC

## 2016-05-02 NOTE — H&P (Signed)
History and Physical    Travis [name] [id number] DOB: June 03, 1960 DOA: 05/02/2016  PCP: [name], MD Consultants:  Erlinda Hong - neurology; Kayleen Memos (NP) - EP; Henrene Pastor - GI Patient coming from: home - lives with wife and the wife's 57yo granddaughter; NOK: wife, [phone]  Chief Complaint: possible CVA  HPI: Travis [name] is a 55 y.o. male with medical history significant of HTN, HLD, DM, and CVA presenting with symptoms concerning for recurrent CVA.  Patient has been dizzy, weak on his left side, swaying to the left and bouncing off things" while walking.  "Basically feeling like crap" since 9pm last night.  Severe back pain.  Unable to sleep last night.  Has h/o CVA, symptoms are somewhat similar - dizziness is the same but no blurred vision this time.  It is hard for him to keep his eyes open, "I feel like I've been rode hard and put up wet."  Has had slurred speech since last stroke and still uses a cane.  Has been on Eliquis for the last 2 months due to h/o afib (seen on loop recorder).   ED Course: Per Dr. Rogene Houston: Patient with onset of dizziness last night around 9 in the evening. Associated with some headache some bodyaches low back pain and then noted to have some increased weakness on his left side patient status post CVA in 2016 with a residual sided weakness but seems to be worse. No change in speech.  Workup here head CT and CTA without any acute findings. Patient's symptoms could press be related to a viral illness but not able to completely rule out CVA. Patient will be admitted to telemetry here.   Review of Systems: As per HPI; otherwise 10 point review of systems reviewed and negative.   Ambulatory Status:  ambulates with a cane  Past Medical History:  Diagnosis Date  . A-fib ([place]) 02/2016   found on loop recorder  . 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 Anxiety   . Arthritis   . Blood transfusion without reported diagnosis   . Chronic headaches   .  Complication of anesthesia    bleed after last shoulder-had to stay overnight  . Depression   . Diabetes mellitus   . Diabetic peripheral neuropathy ([place]) 03/28/2013  . Diverticulosis   . Dizziness   . Hernia, inguinal   . Hyperlipidemia   . Hypertension   . Impingement syndrome of left shoulder 08/02/2014  . 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 Neuropathy of lower extremity   . Night sweats    every once in a while  . Sleep apnea    no CPAP  . Stroke ([place]) 02/2015  . Syncope and collapse     Past Surgical History:  Procedure Laterality Date  . CERVICAL DISC SURGERY    . COLON SURGERY  2003   1/3 removed for diverticulitis  . COLONOSCOPY    . EP IMPLANTABLE DEVICE N/A 05/01/2015   Procedure: Loop Recorder Insertion;  Surgeon: [name], MD;  Location: [facility];  Service: Cardiovascular;  Laterality: N/A;  . INGUINAL HERNIA REPAIR Bilateral   . KNEE ARTHROSCOPY Right   . SHOULDER ARTHROSCOPY Right    1  . SHOULDER ARTHROSCOPY Left 08/02/2014   Procedure: LEFT SHOULDER SCOPE DEBRIDEMENT/ACROMIOPLASTY;  Surgeon: [name], MD;  Location: [facility];  Service: Orthopedics;  Laterality: Left;  ANESTHESIA: GENERAL, PRE/POST OP SCALENE  . TEE WITHOUT CARDIOVERSION N/A 03/03/2015   Procedure: TRANSESOPHAGEAL ECHOCARDIOGRAM (TEE);  Surgeon: [name], MD;  Location: [facility];  Service: Cardiology;  Laterality: N/A;  . UMBILICAL HERNIA REPAIR     with other hernia repair with mesh  . WRIST SURGERY Right    fusion    Social History   Social History  . Marital status: Married    Spouse name: N/A  . Number of children: 2  . Years of education: College   Occupational History  . disabled    Social History Main Topics  . Smoking status: Former Smoker    Packs/day: 1.00    Years: 8.00    Types: Cigarettes    Start date: 06/07/1970    Quit date: 05/03/1978  . Smokeless tobacco: Former Systems developer    Types: Chew    Quit date: 05/03/1978  .  Alcohol use No     Comment: h/o heavy use in the past  . Drug use: No  . Sexual activity: Not on file   Other Topics Concern  . Not on file   Social History Narrative   Drinks some coffee, Drink diet sodas and tea    No Known Allergies  Family History  Problem Relation Age of Onset  . Stroke Father   . Hyperlipidemia Father   . Heart attack Sister 40  . Dementia Mother   . Diabetes Maternal Grandfather   . ALS Brother 50  . Colon cancer Neg Hx   . Esophageal cancer Neg Hx   . Stomach cancer Neg Hx   . Rectal cancer Neg Hx     Prior to Admission medications   Medication Sig Start Date End Date Taking? Authorizing Provider  apixaban (ELIQUIS) 5 MG TABS tablet Take 1 tablet (5 mg total) by mouth 2 (two) times daily. 04/22/16  Yes Sherran Needs, NP  atorvastatin (LIPITOR) 20 MG tablet TAKE 1 TABLET BY MOUTH ONCE DAILY 03/05/16  Yes Kathyrn Drown, MD  Cholecalciferol (VITAMIN D) 400 UNITS capsule Take 400 Units by mouth daily.     Yes Historical Provider, MD  Cyanocobalamin (VITAMIN B12 PO) Take by mouth daily.     Yes Historical Provider, MD  folic acid (FOLVITE) 1 MG tablet TAKE 1 TABLET BY MOUTH DAILY. 03/18/16  Yes Kathyrn Drown, MD  lisinopril (PRINIVIL,ZESTRIL) 2.5 MG tablet Take 1 tablet (2.5 mg total) by mouth every morning. 02/25/16  Yes Mikey Kirschner, MD  metFORMIN (GLUCOPHAGE) 500 MG tablet TAKE 1 TABLETS BY MOUTH 2 TIMES DAILY WITH A MEAL. 02/26/16  Yes Kathyrn Drown, MD  pregabalin (LYRICA) 100 MG capsule Take 1 capsule (100 mg total) by mouth 3 (three) times daily. 02/13/16  Yes Mikey Kirschner, MD  topiramate (TOPAMAX) 25 MG tablet TAKE 2 TABLETS BY MOUTH TWICE DAILY 09/22/15  Yes Rosalin Hawking, MD  traMADol (ULTRAM) 50 MG tablet Take 1 tablet (50 mg total) by mouth 3 (three) times daily as needed for moderate pain. 02/25/16  Yes Mikey Kirschner, MD  valACYclovir (VALTREX) 1000 MG tablet TAKE 1 TABLET BY MOUTH DAILY. 01/16/16  Yes Kathyrn Drown, MD     Physical Exam: Vitals:   05/02/16 1700 05/02/16 1730 05/02/16 1741 05/02/16 1800  BP: 133/92 142/95  149/88  Pulse: 70 72  78  Resp: 18 19  21   Temp:   98.8 F (37.1 C)   TempSrc:      SpO2: 97% 95%  95%  Weight:      Height:         General:  Appears calm and comfortable and is NAD; he does keep his eyes closed or covered mostly.  Some mild dysarthria. Eyes:  PERRL, EOMI, normal lids, iris ENT:  grossly normal hearing, lips & tongue, mmm Neck:  no LAD, masses or thyromegaly Cardiovascular:  RRR, no m/r/g. No LE edema.  Respiratory:  CTA bilaterally, no w/r/r. Normal respiratory effort. Abdomen:  soft, ntnd, NABS Skin:  no rash or induration seen on limited exam Musculoskeletal:  grossly normal tone RUE/RLE, good ROM, no bony abnormality.  Has chronic L-sided weakness and so it is difficult to determine how much L-sided weakness is new. Psychiatric:  Blunted affect, emotionally labile, speech mildly dysathric but otherwise appropriate, AOx3 Neurologic:  CN 2-12 grossly intact, moves all extremities in coordinated fashion, sensation intact  Labs on Admission: I have personally reviewed following labs and imaging studies  CBC:  Recent Labs Lab 05/02/16 1445 05/02/16 1458  WBC 7.0  --   NEUTROABS 5.3  --   HGB 14.7 13.9  HCT 42.0 41.0  MCV 94.8  --   PLT 169  --    Basic Metabolic Panel:  Recent Labs Lab 05/02/16 1445 05/02/16 1458  NA 136 141  K 3.7 3.7  CL 106 105  CO2 24  --   GLUCOSE 289* 286*  BUN 11 10  CREATININE 0.90 0.90  CALCIUM 9.0  --    GFR: Estimated Creatinine Clearance: 102.3 mL/min (by C-G formula based on SCr of 0.9 mg/dL). Liver Function Tests:  Recent Labs Lab 05/02/16 1445  AST 21  ALT 14*  ALKPHOS 62  BILITOT 1.0  PROT 7.2  ALBUMIN 4.1   No results for input(s): LIPASE, AMYLASE in the last 168 hours. No results for input(s): AMMONIA in the last 168 hours. Coagulation Profile:  Recent Labs Lab 05/02/16 1445  INR  1.15   Cardiac Enzymes:  Recent Labs Lab 05/02/16 1445  TROPONINI <0.03   BNP (last 3 results) No results for input(s): PROBNP in the last 8760 hours. HbA1C: No results for input(s): HGBA1C in the last 72 hours. CBG:  Recent Labs Lab 05/02/16 1449  GLUCAP 299*   Lipid Profile: No results for input(s): CHOL, HDL, LDLCALC, TRIG, CHOLHDL, LDLDIRECT in the last 72 hours. Thyroid Function Tests: No results for input(s): TSH, T4TOTAL, FREET4, T3FREE, THYROIDAB in the last 72 hours. Anemia Panel: No results for input(s): VITAMINB12, FOLATE, FERRITIN, TIBC, IRON, RETICCTPCT in the last 72 hours. Urine analysis:    Component Value Date/Time   COLORURINE YELLOW 02/28/2015 1750   APPEARANCEUR HAZY (A) 02/28/2015 1750   LABSPEC 1.015 02/28/2015 1750   PHURINE 7.5 02/28/2015 1750   GLUCOSEU NEGATIVE 02/28/2015 1750   HGBUR NEGATIVE 02/28/2015 1750   BILIRUBINUR NEGATIVE 02/28/2015 1750   KETONESUR NEGATIVE 02/28/2015 1750   PROTEINUR NEGATIVE 02/28/2015 1750   UROBILINOGEN 0.2 02/28/2015 1750   NITRITE NEGATIVE 02/28/2015 1750   LEUKOCYTESUR NEGATIVE 02/28/2015 1750    Creatinine Clearance: Estimated Creatinine Clearance: 102.3 mL/min (by C-G formula based on SCr of 0.9 mg/dL).  Sepsis Labs: @LABRCNTIP (procalcitonin:4,lacticidven:4) )No results found for this or any previous visit (from the past 240 hour(s)).   Radiological Exams on Admission: Ct Angio Head W/cm &/or Wo Cm  Result Date: 05/02/2016 CLINICAL DATA:  Dizziness beginning last night. Worse in this morning. Gait disturbance. Previous strokes. EXAM: CT ANGIOGRAPHY HEAD AND NECK TECHNIQUE: Multidetector CT imaging of the head and neck was performed using the standard protocol during bolus administration of intravenous contrast. Multiplanar CT image reconstructions and  MIPs were obtained to evaluate the vascular anatomy. Carotid stenosis measurements (when applicable) are obtained utilizing NASCET criteria, using the  distal internal carotid diameter as the denominator. CONTRAST:  75 cc Isovue 370 COMPARISON:  CT earlier same day. MRI 02/28/2015. CT angiography 02/28/2015. FINDINGS: CTA NECK FINDINGS Aortic arch: Normal Right carotid system: Common carotid artery widely patent to the bifurcation region. Carotid bifurcation is normal without visible atherosclerosis. Cervical ICA is normal. Left carotid system: Common carotid artery widely patent to the bifurcation region. Carotid bifurcation is normal without atherosclerosis. Cervical internal carotid artery is normal. Vertebral arteries: Right vertebral artery dominant. Both vertebral artery origins widely patent. Both vertebral arteries widely patent through the cervical region. Skeleton: Ordinary cervical spondylosis.  Previous ACDF C5-6. Other neck: No soft tissue lesion. Upper chest: Normal Review of the MIP images confirms the above findings CTA HEAD FINDINGS Anterior circulation: Both internal carotid arteries are widely patent through the siphon region. The anterior and middle cerebral arteries are patent bilaterally, but there is chronic stenosis of the posterior left M2 branch. Posterior circulation: Both vertebral arteries are widely patent at the foramen magnum level. There is atherosclerosis of the distal right vertebral artery with mild ectasia. No stenosis. No focal left vertebral finding. Basilar artery is widely patent. Posterior circulation branch vessels are normal. Venous sinuses: Patent and normal Anatomic variants: None significant Delayed phase: No abnormal enhancement Review of the MIP images confirms the above findings IMPRESSION: No atherosclerotic disease in the upper chest or the neck. Mild narrowing of the posterior left M2 branch as seen previously. No evidence of disease progression. No evidence of acute embolic occlusion. 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 Electronically Signed    By: Nelson Chimes M.D.   On: 05/02/2016 16:44   Ct Head Wo Contrast  Result Date: 05/02/2016 CLINICAL DATA:  Dizziness. EXAM: CT HEAD WITHOUT CONTRAST TECHNIQUE: Contiguous axial images were obtained from the base of the skull through the vertex without intravenous contrast. COMPARISON:  MRI 02/28/2015 FINDINGS: Brain: Old infarcts in the right cerebellar hemisphere. Old lacunar infarct in the left basal ganglia. No acute intracranial abnormality. Specifically, no hemorrhage, hydrocephalus, mass lesion, acute infarction, or significant intracranial injury. Vascular: No hyperdense vessel or unexpected calcification. Skull: No acute calvarial abnormality. Sinuses/Orbits: Visualized paranasal sinuses and mastoids clear. Orbital soft tissues unremarkable. Other: None IMPRESSION: Old right cerebellar infarcts and lacunar infarct in the left basal ganglia. No acute intracranial abnormality. Electronically Signed   By: Rolm Baptise M.D.   On: 05/02/2016 15:34   Ct Angio Neck W Or Wo Contrast  Result Date: 05/02/2016 CLINICAL DATA:  Dizziness beginning last night. Worse in this morning. Gait disturbance. Previous strokes. EXAM: CT ANGIOGRAPHY HEAD AND NECK TECHNIQUE: Multidetector CT imaging of the head and neck was performed using the standard protocol during bolus administration of intravenous contrast. Multiplanar CT image reconstructions and MIPs were obtained to evaluate the vascular anatomy. Carotid stenosis measurements (when applicable) are obtained utilizing NASCET criteria, using the distal internal carotid diameter as the denominator. CONTRAST:  75 cc Isovue 370 COMPARISON:  CT earlier same day. MRI 02/28/2015. CT angiography 02/28/2015. FINDINGS: CTA NECK FINDINGS Aortic arch: Normal Right carotid system: Common carotid artery widely patent to the bifurcation region. Carotid bifurcation is normal without visible atherosclerosis. Cervical ICA is normal. Left carotid system: Common carotid artery widely  patent to the bifurcation region. Carotid bifurcation is normal without atherosclerosis. Cervical internal carotid artery is normal.  Vertebral arteries: Right vertebral artery dominant. Both vertebral artery origins widely patent. Both vertebral arteries widely patent through the cervical region. Skeleton: Ordinary cervical spondylosis.  Previous ACDF C5-6. Other neck: No soft tissue lesion. Upper chest: Normal Review of the MIP images confirms the above findings CTA HEAD FINDINGS Anterior circulation: Both internal carotid arteries are widely patent through the siphon region. The anterior and middle cerebral arteries are patent bilaterally, but there is chronic stenosis of the posterior left M2 branch. Posterior circulation: Both vertebral arteries are widely patent at the foramen magnum level. There is atherosclerosis of the distal right vertebral artery with mild ectasia. No stenosis. No focal left vertebral finding. Basilar artery is widely patent. Posterior circulation branch vessels are normal. Venous sinuses: Patent and normal Anatomic variants: None significant Delayed phase: No abnormal enhancement Review of the MIP images confirms the above findings IMPRESSION: No atherosclerotic disease in the upper chest or the neck. Mild narrowing of the posterior left M2 branch as seen previously. No evidence of disease progression. No evidence of acute embolic occlusion. 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 Electronically Signed   By: Nelson Chimes M.D.   On: 05/02/2016 16:44    EKG: Independently reviewed.  NSR with rate 80;  no evidence of acute ischemia  Assessment/Plan Principal Problem:   Dizziness Active Problems:   Diabetes type 2, controlled (HCC)   Cerebral infarction, chronic   Essential hypertension   HLD (hyperlipidemia)   OSA (obstructive sleep apnea)   Paroxysmal atrial fibrillation (HCC)   Anxiety and depression     Dizziness, h/o CVA in the past -Symptoms are concerning for TIA/CVA -Will place in observation status for CVA/TIA evaluation -Telemetry monitoring -MRI ordered; no MRA due to negative CTA already done -No carotid dopplers due to negative CTA already done -Echo -Risk stratification with FLP; recent A1c shows good control; will also check TSH and UDS -Continue Eliquis daily but will also add ASA -PT/OT/ST/Nutrition Consults   HTN -Allow permissive HTN -Treat BP only if >220/120, and then with goal of 15% reduction -Hold Lisinopril and plan to restart in 48-72 hours  HLD -Check FLP -Has been taking Lipitor 20 mg daily and last FLP in 5/17 showed good control -Will increase Lipitor to 40 mg qhs for now  DM -recent A1c was 5.4 -Glucose today 289 -Hold PO meds and cover with SSI  OSA -Does not wear CPAP -This may be a valuable addition, if able to convince patient  Afib -Detected on loop recorder -NSR while in ER -Will continue Eliquis for now, but likely also needs antiplatelet therapy  Depression and Anxiety -h/o in the past -Not currently taking medications for this -Depression screen by nursing staff as per CVA order set   DVT prophylaxis: Lovenox  Code Status: Full - confirmed with patient Family Communication: None present Disposition Plan:  Home once clinically improved Consults called: None  Admission status: It is my clinical opinion that referral for OBSERVATION is reasonable and necessary in this patient based on the above information provided. The aforementioned taken together are felt to place the patient at high risk for further clinical deterioration. However it is anticipated that the patient may be medically stable for discharge from the hospital within 24 to 48 hours.    Karmen Bongo MD [facility]  If 7PM-7AM, please contact night-coverage [email or web address] Password [email or web address]  05/02/2016, 7:04 PM

## 2016-05-02 NOTE — ED Provider Notes (Signed)
[facility] DEPT Provider Note   CSN: [id number] Arrival date & time: 05/02/16  1427     History   Chief Complaint Chief Complaint  Patient presents with  . Cerebrovascular Accident    HPI Travis [name] is a 55 y.o. male.  A patient with onset of dizziness last night at 9 PM. Associated with some headache bodyaches low back pain. Some loose bowel movements overnight. No vomiting. No fevers. Morning noted to have some increased weakness on the left side. Patient status post CVA in 2016 with residual left-sided weakness. Patient feels like it's increased.      Past Medical History:  Diagnosis Date  . 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 Anxiety   . Arthritis   . Blood transfusion without reported diagnosis   . Chronic headaches   . Complication of anesthesia    bleed after last shoulder-had to stay overnight  . Depression   . Diabetes mellitus   . Diabetic peripheral neuropathy ([place]) 03/28/2013  . Diverticulosis   . Dizziness   . Hernia, inguinal   . Hyperlipidemia   . Hypertension   . Impingement syndrome of left shoulder 08/02/2014  . 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 Neuropathy of lower extremity   . Night sweats    every once in a while  . Sleep apnea    no CPAP  . Stroke ([place]) 02/2015  . Syncope and collapse     Patient Active Problem List   Diagnosis Date Noted  . Dizziness 05/02/2016  . Paroxysmal atrial fibrillation ([place]) 03/02/2016  . PFO (patent foramen ovale) 07/24/2015  . Chronic deep vein thrombosis (DVT) of popliteal vein of left lower extremity ([place]) 07/24/2015  . OSA (obstructive sleep apnea) 04/18/2015  . 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 artery ([place]) 04/18/2015  . Obstructive sleep apnea 03/19/2015  . Cerebellar stroke ([place]) 02/28/2015  . Snoring 01/23/2015  . Degeneration of cervical intervertebral disc 01/23/2015  . Essential hypertension 12/14/2014  . Type 2 diabetes mellitus with other  circulatory complications [id number]  . HLD (hyperlipidemia) 12/14/2014  . Neck pain 12/14/2014  . Cerebral infarction, chronic   . Dizzy   . Vertigo 10/14/2014  . 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 CVA (cerebral infarction) 10/14/2014  . Impingement syndrome of left shoulder 08/02/2014  . 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 Abdominal pain 06/13/2014  . Rectal bleeding 03/13/2014  . Diabetes type 2, controlled ([place]) 02/18/2014  . Hyperlipidemia 02/18/2014  . Diabetic peripheral neuropathy ([place]) 03/28/2013  . Irreducible incisional hernia 11/11/2010    Past Surgical History:  Procedure Laterality Date  . CERVICAL DISC SURGERY    . COLON SURGERY  2003   1/3 removed for diverticulitis  . COLONOSCOPY    . EP IMPLANTABLE DEVICE N/A 05/01/2015   Procedure: Loop Recorder Insertion;  Surgeon: [name], MD;  Location: [facility];  Service: Cardiovascular;  Laterality: N/A;  . INGUINAL HERNIA REPAIR Bilateral   . KNEE ARTHROSCOPY Right   . SHOULDER ARTHROSCOPY Right    1  . SHOULDER ARTHROSCOPY Left 08/02/2014   Procedure: LEFT SHOULDER SCOPE DEBRIDEMENT/ACROMIOPLASTY;  Surgeon: [name], MD;  Location: [facility];  Service: Orthopedics;  Laterality: Left;  ANESTHESIA: GENERAL, PRE/POST OP SCALENE  . TEE WITHOUT CARDIOVERSION N/A 03/03/2015   Procedure: TRANSESOPHAGEAL ECHOCARDIOGRAM (TEE);  Surgeon: [name], MD;  Location[facility];  Service: Cardiology;  Laterality: N/A;  . UMBILICAL HERNIA REPAIR  with other hernia repair with mesh  . WRIST SURGERY Right    fusion       Home Medications    Prior to Admission medications   Medication Sig Start Date End Date Taking? Authorizing Provider  apixaban (ELIQUIS) 5 MG TABS tablet Take 1 tablet (5 mg total) by mouth 2 (two) times daily. 04/22/16  Yes Sherran Needs, NP  atorvastatin (LIPITOR) 20 MG tablet TAKE 1 TABLET BY MOUTH ONCE DAILY 03/05/16  Yes Kathyrn Drown, MD   Cholecalciferol (VITAMIN D) 400 UNITS capsule Take 400 Units by mouth daily.     Yes Historical Provider, MD  Cyanocobalamin (VITAMIN B12 PO) Take by mouth daily.     Yes Historical Provider, MD  folic acid (FOLVITE) 1 MG tablet TAKE 1 TABLET BY MOUTH DAILY. 03/18/16  Yes Kathyrn Drown, MD  lisinopril (PRINIVIL,ZESTRIL) 2.5 MG tablet Take 1 tablet (2.5 mg total) by mouth every morning. 02/25/16  Yes Mikey Kirschner, MD  metFORMIN (GLUCOPHAGE) 500 MG tablet TAKE 1 TABLETS BY MOUTH 2 TIMES DAILY WITH A MEAL. 02/26/16  Yes Kathyrn Drown, MD  pregabalin (LYRICA) 100 MG capsule Take 1 capsule (100 mg total) by mouth 3 (three) times daily. 02/13/16  Yes Mikey Kirschner, MD  topiramate (TOPAMAX) 25 MG tablet TAKE 2 TABLETS BY MOUTH TWICE DAILY 09/22/15  Yes Rosalin Hawking, MD  traMADol (ULTRAM) 50 MG tablet Take 1 tablet (50 mg total) by mouth 3 (three) times daily as needed for moderate pain. 02/25/16  Yes Mikey Kirschner, MD  valACYclovir (VALTREX) 1000 MG tablet TAKE 1 TABLET BY MOUTH DAILY. 01/16/16  Yes Kathyrn Drown, MD    Family History Family History  Problem Relation Age of Onset  . Stroke Father   . Hyperlipidemia Father   . Heart attack Sister 49  . Dementia Mother   . Diabetes Maternal Grandfather   . ALS Brother 9  . Colon cancer Neg Hx   . Esophageal cancer Neg Hx   . Stomach cancer Neg Hx   . Rectal cancer Neg Hx     Social History Social History  Substance Use Topics  . Smoking status: Former Smoker    Packs/day: 1.00    Years: 8.00    Types: Cigarettes    Start date: 06/07/1970    Quit date: 05/03/1978  . Smokeless tobacco: Former Systems developer    Types: Chew    Quit date: 05/03/1978  . Alcohol use No     Comment: occasional shot of taquila     Allergies   Patient has no known allergies.   Review of Systems Review of Systems  Constitutional: Negative for fever.  HENT: Negative for congestion.   Eyes: Negative for visual disturbance.  Respiratory: Negative for  shortness of breath.   Cardiovascular: Negative for chest pain.  Gastrointestinal: Negative for abdominal pain, diarrhea and nausea.  Genitourinary: Negative for dysuria.  Musculoskeletal: Positive for myalgias.  Skin: Negative for rash.  Neurological: Positive for dizziness and headaches.  Hematological: Does not bruise/bleed easily.  Psychiatric/Behavioral: Negative for confusion.     Physical Exam Updated Vital Signs BP 133/89   Pulse 70   Temp 98.4 F (36.9 C) (Oral)   Resp 12   Ht 6\' 1"  (1.854 m)   Wt 83.9 kg   SpO2 95%   BMI 24.41 kg/m   Physical Exam  Constitutional: He is oriented to person, place, and time. He appears well-developed and well-nourished. No distress.  HENT:  Head: Normocephalic and atraumatic.  Mouth/Throat: Oropharynx is clear and moist.  Eyes: Conjunctivae and EOM are normal. Pupils are equal, round, and reactive to light.  Neck: Normal range of motion.  Cardiovascular: Normal rate, regular rhythm and normal heart sounds.   Pulmonary/Chest: Effort normal and breath sounds normal. No respiratory distress.  Abdominal: Soft. Bowel sounds are normal.  Musculoskeletal: Normal range of motion.  Neurological: He is alert and oriented to person, place, and time. No cranial nerve deficit or sensory deficit. He exhibits normal muscle tone. Coordination normal.  Left-sided weakness noted.  Skin: Skin is warm.  Nursing note and vitals reviewed.    ED Treatments / Results  Labs (all labs ordered are listed, but only abnormal results are displayed) Labs Reviewed  COMPREHENSIVE METABOLIC PANEL - Abnormal; Notable for the following:       Result Value   Glucose, Bld 289 (*)    ALT 14 (*)    All other components within normal limits  CBG MONITORING, ED - Abnormal; Notable for the following:    Glucose-Capillary 299 (*)    All other components within normal limits  I-STAT CHEM 8, ED - Abnormal; Notable for the following:    Glucose, Bld 286 (*)    All  other components within normal limits  PROTIME-INR  APTT  CBC  DIFFERENTIAL  TROPONIN I    EKG  EKG Interpretation  Date/Time:  Sunday May 02 2016 14:39:53 EST Ventricular Rate:  80 PR Interval:    QRS Duration: 104 QT Interval:  358 QTC Calculation: 413 R Axis:   77 Text Interpretation:  Sinus rhythm Baseline wander in lead(s) V3 Confirmed by Veronique Warga  MD, Neira Bentsen ([id number]) on 05/02/2016 3:54:24 PM       Radiology Ct Angio Head W/cm &/or Wo Cm  Result Date: 05/02/2016 CLINICAL DATA:  Dizziness beginning last night. Worse in this morning. Gait disturbance. Previous strokes. EXAM: CT ANGIOGRAPHY HEAD AND NECK TECHNIQUE: Multidetector CT imaging of the head and neck was performed using the standard protocol during bolus administration of intravenous contrast. Multiplanar CT image reconstructions and MIPs were obtained to evaluate the vascular anatomy. Carotid stenosis measurements (when applicable) are obtained utilizing NASCET criteria, using the distal internal carotid diameter as the denominator. CONTRAST:  75 cc Isovue 370 COMPARISON:  CT earlier same day. MRI 02/28/2015. CT angiography 02/28/2015. FINDINGS: CTA NECK FINDINGS Aortic arch: Normal Right carotid system: Common carotid artery widely patent to the bifurcation region. Carotid bifurcation is normal without visible atherosclerosis. Cervical ICA is normal. Left carotid system: Common carotid artery widely patent to the bifurcation region. Carotid bifurcation is normal without atherosclerosis. Cervical internal carotid artery is normal. Vertebral arteries: Right vertebral artery dominant. Both vertebral artery origins widely patent. Both vertebral arteries widely patent through the cervical region. Skeleton: Ordinary cervical spondylosis.  Previous ACDF C5-6. Other neck: No soft tissue lesion. Upper chest: Normal Review of the MIP images confirms the above findings CTA HEAD FINDINGS Anterior circulation: Both internal carotid  arteries are widely patent through the siphon region. The anterior and middle cerebral arteries are patent bilaterally, but there is chronic stenosis of the posterior left M2 branch. Posterior circulation: Both vertebral arteries are widely patent at the foramen magnum level. There is atherosclerosis of the distal right vertebral artery with mild ectasia. No stenosis. No focal left vertebral finding. Basilar artery is widely patent. Posterior circulation branch vessels are normal. Venous sinuses: Patent and normal Anatomic variants: None significant Delayed phase: No abnormal enhancement Review of  the MIP images confirms the above findings IMPRESSION: No atherosclerotic disease in the upper chest or the neck. Mild narrowing of the posterior left M2 branch as seen previously. No evidence of disease progression. No evidence of acute embolic occlusion. 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 Electronically Signed   By: Nelson Chimes M.D.   On: 05/02/2016 16:44   Ct Head Wo Contrast  Result Date: 05/02/2016 CLINICAL DATA:  Dizziness. EXAM: CT HEAD WITHOUT CONTRAST TECHNIQUE: Contiguous axial images were obtained from the base of the skull through the vertex without intravenous contrast. COMPARISON:  MRI 02/28/2015 FINDINGS: Brain: Old infarcts in the right cerebellar hemisphere. Old lacunar infarct in the left basal ganglia. No acute intracranial abnormality. Specifically, no hemorrhage, hydrocephalus, mass lesion, acute infarction, or significant intracranial injury. Vascular: No hyperdense vessel or unexpected calcification. Skull: No acute calvarial abnormality. Sinuses/Orbits: Visualized paranasal sinuses and mastoids clear. Orbital soft tissues unremarkable. Other: None IMPRESSION: Old right cerebellar infarcts and lacunar infarct in the left basal ganglia. No acute intracranial abnormality. Electronically Signed   By: Rolm Baptise M.D.   On:  05/02/2016 15:34   Ct Angio Neck W Or Wo Contrast  Result Date: 05/02/2016 CLINICAL DATA:  Dizziness beginning last night. Worse in this morning. Gait disturbance. Previous strokes. EXAM: CT ANGIOGRAPHY HEAD AND NECK TECHNIQUE: Multidetector CT imaging of the head and neck was performed using the standard protocol during bolus administration of intravenous contrast. Multiplanar CT image reconstructions and MIPs were obtained to evaluate the vascular anatomy. Carotid stenosis measurements (when applicable) are obtained utilizing NASCET criteria, using the distal internal carotid diameter as the denominator. CONTRAST:  75 cc Isovue 370 COMPARISON:  CT earlier same day. MRI 02/28/2015. CT angiography 02/28/2015. FINDINGS: CTA NECK FINDINGS Aortic arch: Normal Right carotid system: Common carotid artery widely patent to the bifurcation region. Carotid bifurcation is normal without visible atherosclerosis. Cervical ICA is normal. Left carotid system: Common carotid artery widely patent to the bifurcation region. Carotid bifurcation is normal without atherosclerosis. Cervical internal carotid artery is normal. Vertebral arteries: Right vertebral artery dominant. Both vertebral artery origins widely patent. Both vertebral arteries widely patent through the cervical region. Skeleton: Ordinary cervical spondylosis.  Previous ACDF C5-6. Other neck: No soft tissue lesion. Upper chest: Normal Review of the MIP images confirms the above findings CTA HEAD FINDINGS Anterior circulation: Both internal carotid arteries are widely patent through the siphon region. The anterior and middle cerebral arteries are patent bilaterally, but there is chronic stenosis of the posterior left M2 branch. Posterior circulation: Both vertebral arteries are widely patent at the foramen magnum level. There is atherosclerosis of the distal right vertebral artery with mild ectasia. No stenosis. No focal left vertebral finding. Basilar artery is  widely patent. Posterior circulation branch vessels are normal. Venous sinuses: Patent and normal Anatomic variants: None significant Delayed phase: No abnormal enhancement Review of the MIP images confirms the above findings IMPRESSION: No atherosclerotic disease in the upper chest or the neck. Mild narrowing of the posterior left M2 branch as seen previously. No evidence of disease progression. No evidence of acute embolic occlusion. 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 Electronically Signed   By: Nelson Chimes M.D.   On: 05/02/2016 16:44    Procedures Procedures (including critical care time)  Medications Ordered in ED Medications  iopamidol (ISOVUE-370) 76 % injection 75 mL (75 mLs Intravenous Contrast Given 05/02/16 1617)  Initial Impression / Assessment and Plan / ED Course  I have reviewed the triage vital signs and the nursing notes.  Pertinent labs & imaging results that were available during my care of the patient were reviewed by me and considered in my medical decision making (see chart for details).  Clinical Course    Patient with onset of dizziness last night around 9 in the evening. Associated with some headache some bodyaches low back pain and then noted to have some increased weakness on his left side patient status post CVA in 2016 with a residual sided weakness but seems to be worse. No change in speech.  Workup here head CT and CTA without any acute findings. Patient's symptoms could press be related to a viral illness but not able to completely rule out CVA. Patient will be admitted to telemetry here.  Final Clinical Impressions(s) / ED Diagnoses   Final diagnoses:  Dizziness  Weakness    New Prescriptions New Prescriptions   No medications on file     Fredia Sorrow, MD 05/02/16 1719

## 2016-05-02 NOTE — ED Notes (Signed)
Patient transported to CT 

## 2016-05-02 NOTE — ED Notes (Signed)
No answer on 300 to give report

## 2016-05-02 NOTE — ED Notes (Signed)
Attempt to call report to 300.  No answer

## 2016-05-02 NOTE — ED Triage Notes (Signed)
Pt comes in with family. States he started having dizziness last night around 2100. He went to bed and woke up with worsening dizziness in which he feels like he is leaning to his left. Pt denies any speech problems that are new, only impairments from previous stroke.  States he feels numbness in left index finger. Pt is alert and oriented x4. Pt moving all extremities appropriately. NAD noted.

## 2016-05-03 ENCOUNTER — Observation Stay (HOSPITAL_COMMUNITY): Payer: 59

## 2016-05-03 DIAGNOSIS — G459 Transient cerebral ischemic attack, unspecified: Secondary | ICD-10-CM | POA: Diagnosis not present

## 2016-05-03 DIAGNOSIS — R42 Dizziness and giddiness: Secondary | ICD-10-CM | POA: Diagnosis not present

## 2016-05-03 DIAGNOSIS — E785 Hyperlipidemia, unspecified: Secondary | ICD-10-CM | POA: Diagnosis not present

## 2016-05-03 DIAGNOSIS — I69354 Hemiplegia and hemiparesis following cerebral infarction affecting left non-dominant side: Secondary | ICD-10-CM | POA: Diagnosis not present

## 2016-05-03 DIAGNOSIS — I10 Essential (primary) hypertension: Secondary | ICD-10-CM | POA: Diagnosis not present

## 2016-05-03 DIAGNOSIS — Z79899 Other long term (current) drug therapy: Secondary | ICD-10-CM | POA: Diagnosis not present

## 2016-05-03 DIAGNOSIS — I480 Paroxysmal atrial fibrillation: Secondary | ICD-10-CM

## 2016-05-03 DIAGNOSIS — E1165 Type 2 diabetes mellitus with hyperglycemia: Secondary | ICD-10-CM | POA: Diagnosis not present

## 2016-05-03 DIAGNOSIS — E1142 Type 2 diabetes mellitus with diabetic polyneuropathy: Secondary | ICD-10-CM | POA: Diagnosis not present

## 2016-05-03 DIAGNOSIS — E1159 Type 2 diabetes mellitus with other circulatory complications: Secondary | ICD-10-CM | POA: Diagnosis not present

## 2016-05-03 DIAGNOSIS — B974 Respiratory syncytial virus as the cause of diseases classified elsewhere: Secondary | ICD-10-CM | POA: Diagnosis not present

## 2016-05-03 DIAGNOSIS — Z8673 Personal history of transient ischemic attack (TIA), and cerebral infarction without residual deficits: Secondary | ICD-10-CM | POA: Diagnosis not present

## 2016-05-03 DIAGNOSIS — G458 Other transient cerebral ischemic attacks and related syndromes: Secondary | ICD-10-CM | POA: Diagnosis not present

## 2016-05-03 DIAGNOSIS — I639 Cerebral infarction, unspecified: Secondary | ICD-10-CM | POA: Diagnosis not present

## 2016-05-03 LAB — URINALYSIS, MICROSCOPIC (REFLEX)
Squamous Epithelial / LPF: NONE SEEN
WBC UA: NONE SEEN WBC/hpf (ref 0–5)

## 2016-05-03 LAB — URINALYSIS, ROUTINE W REFLEX MICROSCOPIC
BILIRUBIN URINE: NEGATIVE
Glucose, UA: NEGATIVE mg/dL
Hgb urine dipstick: NEGATIVE
Ketones, ur: NEGATIVE mg/dL
Leukocytes, UA: NEGATIVE
NITRITE: NEGATIVE
PH: 8 (ref 5.0–8.0)
SPECIFIC GRAVITY, URINE: 1.015 (ref 1.005–1.030)

## 2016-05-03 LAB — LIPID PANEL
CHOL/HDL RATIO: 3.2 ratio
CHOLESTEROL: 110 mg/dL (ref 0–200)
HDL: 34 mg/dL — ABNORMAL LOW (ref >40)
LDL Cholesterol: 60 mg/dL (ref 0–99)
Triglycerides: 81 mg/dL (ref <150)
VLDL: 16 mg/dL (ref 0–40)

## 2016-05-03 LAB — GLUCOSE, CAPILLARY
GLUCOSE-CAPILLARY: 188 mg/dL — AB (ref 65–99)
GLUCOSE-CAPILLARY: 137 mg/dL — AB (ref 65–99)
Glucose-Capillary: 165 mg/dL — ABNORMAL HIGH (ref 65–99)
Glucose-Capillary: 193 mg/dL — ABNORMAL HIGH (ref 65–99)

## 2016-05-03 LAB — RAPID URINE DRUG SCREEN, HOSP PERFORMED
AMPHETAMINES: NOT DETECTED
BARBITURATES: NOT DETECTED
BENZODIAZEPINES: NOT DETECTED
COCAINE: NOT DETECTED
Opiates: NOT DETECTED
TETRAHYDROCANNABINOL: POSITIVE — AB

## 2016-05-03 LAB — ECHOCARDIOGRAM COMPLETE
HEIGHTINCHES: 73 in
WEIGHTICAEL: 2705.49 [oz_av]

## 2016-05-03 MED ORDER — HYDROCODONE-ACETAMINOPHEN 5-325 MG PO TABS
1.0000 | ORAL_TABLET | ORAL | Status: DC | PRN
  Administered 2016-05-03 – 2016-05-06 (×8): 2 via ORAL
  Filled 2016-05-03 (×9): qty 2

## 2016-05-03 MED ORDER — GLUCERNA SHAKE PO LIQD
237.0000 mL | Freq: Three times a day (TID) | ORAL | Status: DC
  Administered 2016-05-03 (×2): 237 mL via ORAL

## 2016-05-03 NOTE — Progress Notes (Signed)
*  PRELIMINARY RESULTS* Echocardiogram 2D Echocardiogram has been performed.  Travis [name] 05/03/2016, 12:53 PM

## 2016-05-03 NOTE — Progress Notes (Signed)
Pt back from procedural area, Re-connected to IV.

## 2016-05-03 NOTE — Progress Notes (Signed)
OT Cancellation Note  Patient Details Name: Travis [name] MRN: [id number] DOB: [birth year]   Cancelled Treatment:    Reason Eval/Treat Not Completed: OT screened, no needs identified, will sign off    Ailene Ravel, OTR/L,CBIS  [phone]  05/03/2016, 10:29 AM

## 2016-05-03 NOTE — Progress Notes (Signed)
SLP Cancellation Note  Patient Details Name: Travis [name] MRN: [id number] DOB: 04-21-1961   Cancelled treatment:       Reason Eval/Treat Not Completed: SLP screened, no needs identified, will sign off; SLP consult received as part of stroke protocol. SLP familiar with [name] from previous stroke and outpatient cognitive linguistic therapy. 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 SLP will sign off at this time. Reconsult prn.  Thank you,  Genene Churn, [place]    [place] 05/03/2016, 5:17 PM

## 2016-05-03 NOTE — Evaluation (Signed)
Physical Therapy Evaluation Patient Details Name: Travis [name] MRN: [id number] DOB: [birth year] Today's Date: 05/03/2016   History of Present Illness   Travis [name] is a 56 y.o. male with medical history significant of HTN, HLD, DM, and CVA presenting with symptoms concerning for recurrent CVA.  Patient has been dizzy, weak on his left side, swaying to the left and bouncing off things" while walking.  "Basically feeling like crap" since 9pm last night.  Severe back pain.  Unable to sleep last night.  Has h/o CVA, symptoms are somewhat similar - dizziness is the same but no blurred vision this time.  It is hard for him to keep his eyes open, "I feel like I've been rode hard and put up wet."  Has had slurred speech since last stroke and still uses a cane.  Has been on Eliquis for the last 2 months due to h/o afib (seen on loop recorder).  Clinical Impression  Patient received in bed, pleasant and willing to participate with skilled PT services however complaining of headache this morning. Independent with bed mobility, Mod(I) with sit to stand/stand to sit but uses wide BOS, unsteadiness noted with unsupported heel raises and standing marches. Able to ambulate in room with cane and min guard for safety today, with some unsteadiness still noted but able to self-correct/maintain with use of SPC. Patient will receive skilled PT services for focus on balance during his stay in this facility and will benefit from skilled OP PT services when he is discharged by MD. Patient left in bed with bed alarm activated, all needs otherwise met.     Follow Up Recommendations Outpatient PT    Equipment Recommendations  None recommended by PT    Recommendations for Other Services       Precautions / Restrictions Precautions Precautions: Fall Restrictions Weight Bearing Restrictions: No      Mobility  Bed Mobility Overal bed mobility: Independent                Transfers Overall transfer level:  Modified independent Equipment used: Straight cane                Ambulation/Gait Ambulation/Gait assistance: Min guard Ambulation Distance (Feet): 24 Feet Assistive device: Straight cane Gait Pattern/deviations: WFL(Within Functional Limits)     General Gait Details: general unsteadiness noted with gait with no device, improved with SPC   Stairs            Wheelchair Mobility    Modified Rankin (Stroke Patients Only)       Balance Overall balance assessment: Needs assistance Sitting-balance support: Bilateral upper extremity supported Sitting balance-Leahy Scale: Good     Standing balance support: Single extremity supported Standing balance-Leahy Scale: Fair                               Pertinent Vitals/Pain Pain Assessment: 0-10 Pain Score: 7  Pain Location: headache  Pain Descriptors / Indicators: Aching Pain Intervention(s): Limited activity within patient's tolerance    Home Living Family/patient expects to be discharged to:: Private residence Living Arrangements: Spouse/significant other;Other relatives Available Help at Discharge: Family Type of Home: House       Home Layout: One level        Prior Function Level of Independence: Independent               Hand Dominance        Extremity/Trunk Assessment  Upper Extremity Assessment Upper Extremity Assessment: Defer to OT evaluation    Lower Extremity Assessment Lower Extremity Assessment: Overall WFL for tasks assessed       Communication   Communication: No difficulties  Cognition Arousal/Alertness: Awake/alert Behavior During Therapy: WFL for tasks assessed/performed Overall Cognitive Status: Within Functional Limits for tasks assessed                      General Comments      Exercises     Assessment/Plan    PT Assessment Patient needs continued PT services  PT Problem List Decreased safety awareness;Decreased balance           PT Treatment Interventions Therapeutic activities;Gait training;Patient/family education;Balance training;Neuromuscular re-education    PT Goals (Current goals can be found in the Care Plan section)  Acute Rehab PT Goals Patient Stated Goal: to go home  PT Goal Formulation: With patient Time For Goal Achievement: 05/17/16 Potential to Achieve Goals: Good    Frequency Min 2X/week   Barriers to discharge        Co-evaluation               End of Session Equipment Utilized During Treatment: Gait belt Activity Tolerance: Patient tolerated treatment well Patient left: in bed;with call bell/phone within reach;with bed alarm set      Functional Assessment Tool Used: Based on skilled assessment of strength, gait, balance  Functional Limitation: Mobility: Walking and moving around Mobility: Walking and Moving Around Current Status (M2111): At least 20 percent but less than 40 percent impaired, limited or restricted Mobility: Walking and Moving Around Goal Status [phone]): At least 1 percent but less than 20 percent impaired, limited or restricted    Time: 0857-0910 PT Time Calculation (min) (ACUTE ONLY): 13 min   Charges:   PT Evaluation $PT Eval Low Complexity: 1 Procedure     PT G Codes:   PT G-Codes **NOT FOR INPATIENT CLASS** Functional Assessment Tool Used: Based on skilled assessment of strength, gait, balance  Functional Limitation: Mobility: Walking and moving around Mobility: Walking and Moving Around Current Status (Y2233): At least 20 percent but less than 40 percent impaired, limited or restricted Mobility: Walking and Moving Around Goal Status [phone]): At least 1 percent but less than 20 percent impaired, limited or restricted    Deniece Ree PT, DPT [phone]

## 2016-05-03 NOTE — Progress Notes (Signed)
Inpatient Diabetes Program Recommendations  AACE/ADA: New Consensus Statement on Inpatient Glycemic Control (2015)  Target Ranges:  Prepandial:   less than 140 mg/dL      Peak postprandial:   less than 180 mg/dL (1-2 hours)      Critically ill patients:  140 - 180 mg/dL   Lab Results  Component Value Date   GLUCAP 188 (H) 05/03/2016   HGBA1C 5.4 04/19/2016   Review of Glycemic Control  Consult for CVA Evaluation  Diabetes history: DM 2 Outpatient Diabetes medications: Metformin 500 mg BID Current orders for Inpatient glycemic control: Novolog Moderate TID + HS  Inpatient Diabetes Program Recommendations:   Consult was placed for CVA evaluation. Admitting glucose 299 mg/dl. Patient is on Metformin outpatient. Last A1c 5.4% on 04/19/16. Would not repeat lab at this time. Patient with great control at home. No needs identified.  Inpatient glucose down to 188 mg/dl this am patient on Novolog Moderate correction. Will watch trends while here.  Thanks,  Tama Headings RN, MSN, [facility] Inpatient Diabetes Coordinator Team Pager [phone] (8a-5p)

## 2016-05-03 NOTE — Progress Notes (Signed)
Pt refused MRI. Stating he needed to be "put under," in order to have one. Later on when wife comes up, she stated he would be find with Ativan before going, yet MRI was already closed for the day. MD notified, will continue to monitor.

## 2016-05-03 NOTE — Progress Notes (Signed)
Nutrition Follow-up   INTERVENTION:  Glucerna Shake po TID, each supplement provides 220 kcal and 10 grams of protein    NUTRITION DIAGNOSIS:  Inadequate oral intake related to increased stress at home as evidenced by 58 yo grandchild living with them and reported intake    GOAL:  Pt to meet >/= 90% of their estimated nutrition needs      MONITOR:  Po intake, labs and wt trends     REASON FOR ASSESSMENT: consult to assess due to suspected CVA     ASSESSMENT: Patient is a 56 y.o. male with medical history significant of HTN, HLD, DM, and CVA presenting with symptoms concerning for recurrent CVA.    His wife provided most of the history. She says that pt has been under increased amount of stress since their 2 yo grandchild is now living with them. His appetite is fair. He doesn't work and is at home during the day while she works.  His weight is decreased a few pounds as well. Not significantly based on report from wife. Usual range mostly between 78-83 kg over the past 6-8 months.   Nutrition-Focused physical exam deferred at this time.    Recent Labs Lab 05/02/16 1445 05/02/16 1458  NA 136 141  K 3.7 3.7  CL 106 105  CO2 24  --   BUN 11 10  CREATININE 0.90 0.90  CALCIUM 9.0  --   GLUCOSE 289* 286*   labs: glucose 286 - but recent A1C is within desirable range 5.4%  Diet Order:  Diet NPO time specified  Skin:   intact  Last BM:   12/31  Height:   Ht Readings from Last 1 Encounters:  05/02/16 6\' 1"  (1.854 m)    Weight:   Wt Readings from Last 1 Encounters:  05/02/16 169 lb 1.5 oz (76.7 kg)    Ideal Body Weight:    84 kg   BMI:  Body mass index is 22.31 kg/m.   Estimated Nutritional Needs:   Kcal:   1925- 2310  Protein:   90-95 gr  Fluid:   1.9-2.3 liters daily  EDUCATION NEEDS: none at this time    Colman Cater MS,RD,CSG,LDN Office: [phone] Pager: [phone]

## 2016-05-03 NOTE — Progress Notes (Signed)
PROGRESS NOTE    BURT NAREZ  [id number] DOB: 1960/08/13 DOA: 05/02/2016 PCP: [name], MD    Brief Narrative:  56 year old male with history of hypertension, diabetes and previous stroke. Presents to the hospital with complaints of dizziness and generalized weakness. He reports that he keeps leaning to the left while he is walking. Due to concerns of underlying CVA, he was referred for admission.  Assessment & Plan:   Principal Problem:   Dizziness Active Problems:   Diabetes type 2, controlled (HCC)   Cerebral infarction, chronic   Essential hypertension   HLD (hyperlipidemia)   OSA (obstructive sleep apnea)   Paroxysmal atrial fibrillation (HCC)   Anxiety and depression   1. Dizziness. This is associated with headache/neck pain. He's had symptoms for the past few days. They don't seem to be improving. He reports constantly leaning to the left. Vision is unchanged. Initial CT scans were unremarkable. Patient initially refused MRI scans, but is now agreeable if premedicated with Ativan. Will request neurology input. Echocardiogram is unremarkable. Carotid Dopplers did not show any acute findings.  2. Hypertension. Blood pressure currently stable.  3. Hyperlipidemia. LDL is at goal on current dose of Lipitor.  4. Diabetes. Continue sliding scale insulin. Blood sugars are currently stable.  5. Atrial fibrillation. Due to the loop recorder. Currently normal sinus rhythm. Anticoagulated with eliquis  6. Mild fever. Etiology is unclear. Chest x-ray and urinalysis are unremarkable. Doubtful this is CNS infection. No leukocytosis. We'll continue to monitor.   DVT prophylaxis: eliquis Code Status: Full code Family Communication: Discussed with wife at the bedside Disposition Plan: Discharge home once improved   Consultants:     Procedures:  Echo:Left ventricle: The cavity size was normal. Wall thickness was   increased in a pattern of mild LVH. Systolic function  was normal.   The estimated ejection fraction was in the range of 50% to 55%.   Wall motion was normal; there were no regional wall motion   abnormalities. Doppler parameters are consistent with abnormal    left ventricular relaxation (grade 1 diastolic dysfunction).  Antimicrobials:      Subjective: Complaints of posterior neck pain on the sides of his neck. He also has occipital headache.  Objective: Vitals:   05/03/16 0620 05/03/16 0820 05/03/16 1220 05/03/16 1620  BP: 125/79 118/77 139/77 131/85  Pulse: 82 93 90 86  Resp: 16 18 19 18   Temp: 98.7 F (37.1 C) 98.5 F (36.9 C) 98.7 F (37.1 C) 98.2 F (36.8 C)  TempSrc: Oral Oral Oral Oral  SpO2: 95% 100% 96% 96%  Weight:      Height:        Intake/Output Summary (Last 24 hours) at 05/03/16 1825 Last data filed at 05/03/16 1745  Gross per 24 hour  Intake           984.17 ml  Output              350 ml  Net           634.17 ml   Filed Weights   05/02/16 1435 05/02/16 2020  Weight: 83.9 kg (185 lb) 76.7 kg (169 lb 1.5 oz)    Examination:  General exam: Appears calm and comfortable  Respiratory system: Clear to auscultation. Respiratory effort normal. Cardiovascular system: S1 & S2 heard, RRR. No JVD, murmurs, rubs, gallops or clicks. No pedal edema. Gastrointestinal system: Abdomen is nondistended, soft and nontender. No organomegaly or masses felt. Normal bowel sounds heard. Central nervous  system: Alert and oriented. No new focal neurological deficits. Speech is slurred (chronic). He does have photophobia. Extremities: Symmetric 5 x 5 power. Skin: No rashes, lesions or ulcers Psychiatry: Judgement and insight appear normal. Mood & affect appropriate.     Data Reviewed: I have personally reviewed following labs and imaging studies  CBC:  Recent Labs Lab 05/02/16 1445 05/02/16 1458  WBC 7.0  --   NEUTROABS 5.3  --   HGB 14.7 13.9  HCT 42.0 41.0  MCV 94.8  --   PLT 169  --    Basic Metabolic  Panel:  Recent Labs Lab 05/02/16 1445 05/02/16 1458  NA 136 141  K 3.7 3.7  CL 106 105  CO2 24  --   GLUCOSE 289* 286*  BUN 11 10  CREATININE 0.90 0.90  CALCIUM 9.0  --    GFR: Estimated Creatinine Clearance: 98.2 mL/min (by C-G formula based on SCr of 0.9 mg/dL). Liver Function Tests:  Recent Labs Lab 05/02/16 1445  AST 21  ALT 14*  ALKPHOS 62  BILITOT 1.0  PROT 7.2  ALBUMIN 4.1   No results for input(s): LIPASE, AMYLASE in the last 168 hours. No results for input(s): AMMONIA in the last 168 hours. Coagulation Profile:  Recent Labs Lab 05/02/16 1445  INR 1.15   Cardiac Enzymes:  Recent Labs Lab 05/02/16 1445  TROPONINI <0.03   BNP (last 3 results) No results for input(s): PROBNP in the last 8760 hours. HbA1C: No results for input(s): HGBA1C in the last 72 hours. CBG:  Recent Labs Lab 05/02/16 1449 05/02/16 2102 05/03/16 0808 05/03/16 1139 05/03/16 1658  GLUCAP 299* 129* 188* 165* 193*   Lipid Profile:  Recent Labs  05/03/16 0545  CHOL 110  HDL 34*  LDLCALC 60  TRIG 81  CHOLHDL 3.2   Thyroid Function Tests:  Recent Labs  05/02/16 1445  TSH 0.720   Anemia Panel: No results for input(s): VITAMINB12, FOLATE, FERRITIN, TIBC, IRON, RETICCTPCT in the last 72 hours. Sepsis Labs: No results for input(s): PROCALCITON, LATICACIDVEN in the last 168 hours.  No results found for this or any previous visit (from the past 240 hour(s)).       Radiology Studies: Ct Angio Head W/cm &/or Wo Cm  Result Date: 05/02/2016 CLINICAL DATA:  Dizziness beginning last night. Worse in this morning. Gait disturbance. Previous strokes. EXAM: CT ANGIOGRAPHY HEAD AND NECK TECHNIQUE: Multidetector CT imaging of the head and neck was performed using the standard protocol during bolus administration of intravenous contrast. Multiplanar CT image reconstructions and MIPs were obtained to evaluate the vascular anatomy. Carotid stenosis measurements (when  applicable) are obtained utilizing NASCET criteria, using the distal internal carotid diameter as the denominator. CONTRAST:  75 cc Isovue 370 COMPARISON:  CT earlier same day. MRI 02/28/2015. CT angiography 02/28/2015. FINDINGS: CTA NECK FINDINGS Aortic arch: Normal Right carotid system: Common carotid artery widely patent to the bifurcation region. Carotid bifurcation is normal without visible atherosclerosis. Cervical ICA is normal. Left carotid system: Common carotid artery widely patent to the bifurcation region. Carotid bifurcation is normal without atherosclerosis. Cervical internal carotid artery is normal. Vertebral arteries: Right vertebral artery dominant. Both vertebral artery origins widely patent. Both vertebral arteries widely patent through the cervical region. Skeleton: Ordinary cervical spondylosis.  Previous ACDF C5-6. Other neck: No soft tissue lesion. Upper chest: Normal Review of the MIP images confirms the above findings CTA HEAD FINDINGS Anterior circulation: Both internal carotid arteries are widely patent through the siphon region.  The anterior and middle cerebral arteries are patent bilaterally, but there is chronic stenosis of the posterior left M2 branch. Posterior circulation: Both vertebral arteries are widely patent at the foramen magnum level. There is atherosclerosis of the distal right vertebral artery with mild ectasia. No stenosis. No focal left vertebral finding. Basilar artery is widely patent. Posterior circulation branch vessels are normal. Venous sinuses: Patent and normal Anatomic variants: None significant Delayed phase: No abnormal enhancement Review of the MIP images confirms the above findings IMPRESSION: No atherosclerotic disease in the upper chest or the neck. Mild narrowing of the posterior left M2 branch as seen previously. No evidence of disease progression. No evidence of acute embolic occlusion. 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 Electronically Signed   By: Nelson Chimes M.D.   On: 05/02/2016 16:44   Dg Chest 2 View  Result Date: 05/02/2016 CLINICAL DATA:  Dizziness since last night. EXAM: CHEST  2 VIEW COMPARISON:  None. FINDINGS: The heart size and mediastinal contours are within normal limits. There is no focal infiltrate, pulmonary edema, or pleural effusion. The visualized skeletal structures are unremarkable. IMPRESSION: No active cardiopulmonary disease. Electronically Signed   By: Abelardo Diesel M.D.   On: 05/02/2016 19:58   Ct Head Wo Contrast  Result Date: 05/02/2016 CLINICAL DATA:  Dizziness. EXAM: CT HEAD WITHOUT CONTRAST TECHNIQUE: Contiguous axial images were obtained from the base of the skull through the vertex without intravenous contrast. COMPARISON:  MRI 02/28/2015 FINDINGS: Brain: Old infarcts in the right cerebellar hemisphere. Old lacunar infarct in the left basal ganglia. No acute intracranial abnormality. Specifically, no hemorrhage, hydrocephalus, mass lesion, acute infarction, or significant intracranial injury. Vascular: No hyperdense vessel or unexpected calcification. Skull: No acute calvarial abnormality. Sinuses/Orbits: Visualized paranasal sinuses and mastoids clear. Orbital soft tissues unremarkable. Other: None IMPRESSION: Old right cerebellar infarcts and lacunar infarct in the left basal ganglia. No acute intracranial abnormality. Electronically Signed   By: Rolm Baptise M.D.   On: 05/02/2016 15:34   Ct Angio Neck W Or Wo Contrast  Result Date: 05/02/2016 CLINICAL DATA:  Dizziness beginning last night. Worse in this morning. Gait disturbance. Previous strokes. EXAM: CT ANGIOGRAPHY HEAD AND NECK TECHNIQUE: Multidetector CT imaging of the head and neck was performed using the standard protocol during bolus administration of intravenous contrast. Multiplanar CT image reconstructions and MIPs were obtained to evaluate the vascular anatomy. Carotid  stenosis measurements (when applicable) are obtained utilizing NASCET criteria, using the distal internal carotid diameter as the denominator. CONTRAST:  75 cc Isovue 370 COMPARISON:  CT earlier same day. MRI 02/28/2015. CT angiography 02/28/2015. FINDINGS: CTA NECK FINDINGS Aortic arch: Normal Right carotid system: Common carotid artery widely patent to the bifurcation region. Carotid bifurcation is normal without visible atherosclerosis. Cervical ICA is normal. Left carotid system: Common carotid artery widely patent to the bifurcation region. Carotid bifurcation is normal without atherosclerosis. Cervical internal carotid artery is normal. Vertebral arteries: Right vertebral artery dominant. Both vertebral artery origins widely patent. Both vertebral arteries widely patent through the cervical region. Skeleton: Ordinary cervical spondylosis.  Previous ACDF C5-6. Other neck: No soft tissue lesion. Upper chest: Normal Review of the MIP images confirms the above findings CTA HEAD FINDINGS Anterior circulation: Both internal carotid arteries are widely patent through the siphon region. The anterior and middle cerebral arteries are patent bilaterally, but there is chronic stenosis of the posterior left M2 branch. Posterior circulation: Both vertebral arteries are widely patent  at the foramen magnum level. There is atherosclerosis of the distal right vertebral artery with mild ectasia. No stenosis. No focal left vertebral finding. Basilar artery is widely patent. Posterior circulation branch vessels are normal. Venous sinuses: Patent and normal Anatomic variants: None significant Delayed phase: No abnormal enhancement Review of the MIP images confirms the above findings IMPRESSION: No atherosclerotic disease in the upper chest or the neck. Mild narrowing of the posterior left M2 branch as seen previously. No evidence of disease progression. No evidence of acute embolic occlusion. Chronic atherosclerotic disease of the  distal right vertebral artery with mild ectasia as seen previously. Electronically Signed   By: Nelson Chimes M.D.   On: 05/02/2016 16:44   US Carotid Bilateral (at [facility] And [facility] Only)  Result Date: 05/03/2016 CLINICAL DATA:  TIA for the past day. History of hypertension, syncopal episode, hyperlipidemia and diabetes. EXAM: BILATERAL CAROTID DUPLEX ULTRASOUND TECHNIQUE: Pearline Cables scale imaging, color Doppler and duplex ultrasound were performed of bilateral carotid and vertebral arteries in the neck. COMPARISON:  Neck CTA - 05/02/2016 FINDINGS: Criteria: Quantification of carotid stenosis is based on velocity parameters that correlate the residual internal carotid diameter with NASCET-based stenosis levels, using the diameter of the distal internal carotid lumen as the denominator for stenosis measurement. The following velocity measurements were obtained: RIGHT ICA:  68/25 cm/sec CCA:  0000000 cm/sec SYSTOLIC ICA/CCA RATIO:  0.6 DIASTOLIC ICA/CCA RATIO:  1.0 ECA:  116 cm/sec LEFT ICA:  69/16 cm/sec CCA:  [id number] cm/sec SYSTOLIC ICA/CCA RATIO:  0.6 DIASTOLIC ICA/CCA RATIO:  0.6 ECA:  96 cm/sec RIGHT CAROTID ARTERY: There is no grayscale evidence of significant intimal thickening or atherosclerotic plaque affecting interrogated portions of the right internal carotid artery. There are no elevated peak systolic velocities within the interrogated course the right internal carotid artery to suggest a hemodynamically significant stenosis. RIGHT VERTEBRAL ARTERY:  Antegrade Flow LEFT CAROTID ARTERY: There is no grayscale evidence of intimal thickening or atherosclerotic plaque affecting interrogated portions of the left carotid system. There are no elevated peak systolic velocities within the interrogated course the left internal carotid artery to suggest a hemodynamically significant stenosis. LEFT VERTEBRAL ARTERY:  Antegrade Flow IMPRESSION: Normal carotid Doppler ultrasound. Electronically Signed   By: Sandi Mariscal M.D.   On:  05/03/2016 10:01        Scheduled Meds: . sodium chloride   Intravenous STAT  . apixaban  5 mg Oral BID  . aspirin  300 mg Rectal Daily   Or  . aspirin  325 mg Oral Daily  . atorvastatin  40 mg Oral Daily  . feeding supplement (GLUCERNA SHAKE)  237 mL Oral TID BM  . folic acid  1 mg Oral Daily  . insulin aspart  0-15 Units Subcutaneous TID WC  . insulin aspart  0-5 Units Subcutaneous QHS  . pregabalin  100 mg Oral TID  . topiramate  50 mg Oral BID  . valACYclovir  1,000 mg Oral Daily   Continuous Infusions: . sodium chloride 50 mL/hr at 05/02/16 2107     LOS: 0 days    Time spent: 61mins    Muriel Hannold, MD [facility] Pager [phone]  If 7PM-7AM, please contact night-coverage [email or web address] Password [facility] 05/03/2016, 6:25 PM

## 2016-05-03 NOTE — Progress Notes (Signed)
Pt being transported down to procedural area. IV stopped and un-hooked until pt returns.

## 2016-05-04 ENCOUNTER — Telehealth: Payer: Self-pay | Admitting: Family Medicine

## 2016-05-04 ENCOUNTER — Telehealth: Payer: Self-pay

## 2016-05-04 ENCOUNTER — Observation Stay (HOSPITAL_COMMUNITY): Payer: 59

## 2016-05-04 DIAGNOSIS — M545 Low back pain: Secondary | ICD-10-CM | POA: Diagnosis present

## 2016-05-04 DIAGNOSIS — I10 Essential (primary) hypertension: Secondary | ICD-10-CM | POA: Diagnosis not present

## 2016-05-04 DIAGNOSIS — G4733 Obstructive sleep apnea (adult) (pediatric): Secondary | ICD-10-CM | POA: Diagnosis present

## 2016-05-04 DIAGNOSIS — Z8673 Personal history of transient ischemic attack (TIA), and cerebral infarction without residual deficits: Secondary | ICD-10-CM | POA: Diagnosis not present

## 2016-05-04 DIAGNOSIS — Z8249 Family history of ischemic heart disease and other diseases of the circulatory system: Secondary | ICD-10-CM | POA: Diagnosis not present

## 2016-05-04 DIAGNOSIS — J069 Acute upper respiratory infection, unspecified: Secondary | ICD-10-CM | POA: Diagnosis present

## 2016-05-04 DIAGNOSIS — F419 Anxiety disorder, unspecified: Secondary | ICD-10-CM | POA: Diagnosis present

## 2016-05-04 DIAGNOSIS — E1165 Type 2 diabetes mellitus with hyperglycemia: Secondary | ICD-10-CM | POA: Diagnosis present

## 2016-05-04 DIAGNOSIS — J210 Acute bronchiolitis due to respiratory syncytial virus: Secondary | ICD-10-CM | POA: Diagnosis not present

## 2016-05-04 DIAGNOSIS — Z7984 Long term (current) use of oral hypoglycemic drugs: Secondary | ICD-10-CM | POA: Diagnosis not present

## 2016-05-04 DIAGNOSIS — I480 Paroxysmal atrial fibrillation: Secondary | ICD-10-CM | POA: Diagnosis present

## 2016-05-04 DIAGNOSIS — I639 Cerebral infarction, unspecified: Secondary | ICD-10-CM | POA: Diagnosis not present

## 2016-05-04 DIAGNOSIS — Z833 Family history of diabetes mellitus: Secondary | ICD-10-CM | POA: Diagnosis not present

## 2016-05-04 DIAGNOSIS — R51 Headache: Secondary | ICD-10-CM | POA: Diagnosis not present

## 2016-05-04 DIAGNOSIS — F329 Major depressive disorder, single episode, unspecified: Secondary | ICD-10-CM | POA: Diagnosis present

## 2016-05-04 DIAGNOSIS — R260 Ataxic gait: Secondary | ICD-10-CM | POA: Diagnosis not present

## 2016-05-04 DIAGNOSIS — Z823 Family history of stroke: Secondary | ICD-10-CM | POA: Diagnosis not present

## 2016-05-04 DIAGNOSIS — R42 Dizziness and giddiness: Secondary | ICD-10-CM | POA: Diagnosis not present

## 2016-05-04 DIAGNOSIS — G8929 Other chronic pain: Secondary | ICD-10-CM | POA: Diagnosis present

## 2016-05-04 DIAGNOSIS — B974 Respiratory syncytial virus as the cause of diseases classified elsewhere: Secondary | ICD-10-CM | POA: Diagnosis present

## 2016-05-04 DIAGNOSIS — R270 Ataxia, unspecified: Secondary | ICD-10-CM | POA: Diagnosis present

## 2016-05-04 DIAGNOSIS — E785 Hyperlipidemia, unspecified: Secondary | ICD-10-CM | POA: Diagnosis present

## 2016-05-04 DIAGNOSIS — Z79899 Other long term (current) drug therapy: Secondary | ICD-10-CM | POA: Diagnosis not present

## 2016-05-04 DIAGNOSIS — I69354 Hemiplegia and hemiparesis following cerebral infarction affecting left non-dominant side: Secondary | ICD-10-CM | POA: Diagnosis not present

## 2016-05-04 DIAGNOSIS — I69328 Other speech and language deficits following cerebral infarction: Secondary | ICD-10-CM | POA: Diagnosis not present

## 2016-05-04 DIAGNOSIS — E1159 Type 2 diabetes mellitus with other circulatory complications: Secondary | ICD-10-CM | POA: Diagnosis not present

## 2016-05-04 DIAGNOSIS — E1142 Type 2 diabetes mellitus with diabetic polyneuropathy: Secondary | ICD-10-CM | POA: Diagnosis present

## 2016-05-04 DIAGNOSIS — R509 Fever, unspecified: Secondary | ICD-10-CM | POA: Diagnosis not present

## 2016-05-04 LAB — CBC
HEMATOCRIT: 43 % (ref 39.0–52.0)
Hemoglobin: 15 g/dL (ref 13.0–17.0)
MCH: 33.6 pg (ref 26.0–34.0)
MCHC: 34.9 g/dL (ref 30.0–36.0)
MCV: 96.2 fL (ref 78.0–100.0)
PLATELETS: 158 10*3/uL (ref 150–400)
RBC: 4.47 MIL/uL (ref 4.22–5.81)
RDW: 12.3 % (ref 11.5–15.5)
WBC: 6.5 10*3/uL (ref 4.0–10.5)

## 2016-05-04 LAB — GLUCOSE, CAPILLARY
GLUCOSE-CAPILLARY: 160 mg/dL — AB (ref 65–99)
GLUCOSE-CAPILLARY: 215 mg/dL — AB (ref 65–99)
Glucose-Capillary: 323 mg/dL — ABNORMAL HIGH (ref 65–99)
Glucose-Capillary: 171 mg/dL — ABNORMAL HIGH (ref 65–99)

## 2016-05-04 LAB — BASIC METABOLIC PANEL
ANION GAP: 7 (ref 5–15)
BUN: 15 mg/dL (ref 6–20)
CO2: 23 mmol/L (ref 22–32)
CREATININE: 0.96 mg/dL (ref 0.61–1.24)
Calcium: 8.8 mg/dL — ABNORMAL LOW (ref 8.9–10.3)
Chloride: 106 mmol/L (ref 101–111)
Glucose, Bld: 171 mg/dL — ABNORMAL HIGH (ref 65–99)
Potassium: 3.9 mmol/L (ref 3.5–5.1)
SODIUM: 136 mmol/L (ref 135–145)

## 2016-05-04 LAB — LACTIC ACID, PLASMA: LACTIC ACID, VENOUS: 1.4 mmol/L (ref 0.5–1.9)

## 2016-05-04 LAB — INFLUENZA PANEL BY PCR (TYPE A & B)
INFLAPCR: NEGATIVE
INFLBPCR: NEGATIVE

## 2016-05-04 MED ORDER — LORAZEPAM 2 MG/ML IJ SOLN
1.0000 mg | INTRAMUSCULAR | Status: AC
  Administered 2016-05-04 (×2): 1 mg via INTRAVENOUS
  Filled 2016-05-04: qty 1

## 2016-05-04 MED FILL — TOPIRAMATE 25 MG TABLET: 25 | 30 days supply | Qty: 120 | Fill #5

## 2016-05-04 NOTE — Progress Notes (Signed)
PROGRESS NOTE    Travis [name]  [id number] DOB: 24-Oct-1960 DOA: 05/02/2016 PCP: [name], MD    Brief Narrative:  56 year old male with history of hypertension, diabetes and previous stroke. Presents to the hospital with complaints of dizziness and generalized weakness. He reports that he keeps leaning to the left while he is walking. Due to concerns of underlying CVA, he was referred for admission.  Assessment & Plan:   Principal Problem:   Dizziness Active Problems:   Diabetes type 2, controlled (HCC)   Cerebral infarction, chronic   Essential hypertension   HLD (hyperlipidemia)   OSA (obstructive sleep apnea)   Paroxysmal atrial fibrillation (HCC)   Anxiety and depression   1. Dizziness. This is associated with headache/neck pain. He's had symptoms for the past few days. They don't seem to be improving. He reports constantly leaning to the left. Vision is unchanged. Initial CT scans were unremarkable.MRI is planned for today. Neurology is seeing patient today. If MRI is unremarkable, may need to consider further work up including lumbar puncture. Will hold further eliquis in case patient requires any procedures. Echocardiogram is unremarkable. Carotid Dopplers did not show any acute findings.  2. Hypertension. Blood pressure currently stable.  3. Hyperlipidemia. LDL is at goal on current dose of Lipitor.  4. Diabetes. Continue sliding scale insulin. Blood sugars are currently stable.  5. Atrial fibrillation. Found on loop recorder. Currently normal sinus rhythm. Anticoagulated with eliquis  6. Mild fever. Etiology is unclear. Chest x-ray and urinalysis are unremarkable. Doubtful this is CNS infection. No leukocytosis. We'll continue to monitor. He has not had fever since yesterday   DVT prophylaxis: eliquis (currently on hold) Code Status: Full code Family Communication: Discussed with wife at the bedside Disposition Plan: Discharge home once  improved   Consultants:   Neurology  Procedures:  Echo:Left ventricle: The cavity size was normal. Wall thickness was   increased in a pattern of mild LVH. Systolic function was normal.   The estimated ejection fraction was in the range of 50% to 55%.   Wall motion was normal; there were no regional wall motion   abnormalities. Doppler parameters are consistent with abnormal    left ventricular relaxation (grade 1 diastolic dysfunction).  Antimicrobials:      Subjective: Continues to have dizziness and headache.  Objective: Vitals:   05/03/16 2020 05/04/16 0020 05/04/16 0420 05/04/16 0820  BP: 133/78 (!) 124/92 126/82 120/75  Pulse: 84 90 83 75  Resp: 18 16 16 16   Temp: 98.6 F (37 C) 98.3 F (36.8 C) 98.5 F (36.9 C) 98.3 F (36.8 C)  TempSrc: Oral Oral Oral Oral  SpO2: 96% 97% 98% 99%  Weight:      Height:        Intake/Output Summary (Last 24 hours) at 05/04/16 1224 Last data filed at 05/04/16 0800  Gross per 24 hour  Intake          1400.83 ml  Output                0 ml  Net          1400.83 ml   Filed Weights   05/02/16 1435 05/02/16 2020  Weight: 83.9 kg (185 lb) 76.7 kg (169 lb 1.5 oz)    Examination:  General exam: Appears calm and comfortable  Respiratory system: Clear to auscultation. Respiratory effort normal. Cardiovascular system: S1 & S2 heard, RRR. No JVD, murmurs, rubs, gallops or clicks. No pedal edema. Gastrointestinal system: Abdomen  is nondistended, soft and nontender. No organomegaly or masses felt. Normal bowel sounds heard. Central nervous system: Alert and oriented. No new focal neurological deficits. Speech is slurred (chronic).  Extremities: Symmetric 5 x 5 power. Skin: No rashes, lesions or ulcers Psychiatry: Judgement and insight appear normal. Mood & affect appropriate.     Data Reviewed: I have personally reviewed following labs and imaging studies  CBC:  Recent Labs Lab 05/02/16 1445 05/02/16 1458 05/04/16 0507   WBC 7.0  --  6.5  NEUTROABS 5.3  --   --   HGB 14.7 13.9 15.0  HCT 42.0 41.0 43.0  MCV 94.8  --  96.2  PLT 169  --  0000000   Basic Metabolic Panel:  Recent Labs Lab 05/02/16 1445 05/02/16 1458 05/04/16 0507  NA 136 141 136  K 3.7 3.7 3.9  CL 106 105 106  CO2 24  --  23  GLUCOSE 289* 286* 171*  BUN 11 10 15   CREATININE 0.90 0.90 0.96  CALCIUM 9.0  --  8.8*   GFR: Estimated Creatinine Clearance: 92.1 mL/min (by C-G formula based on SCr of 0.96 mg/dL). Liver Function Tests:  Recent Labs Lab 05/02/16 1445  AST 21  ALT 14*  ALKPHOS 62  BILITOT 1.0  PROT 7.2  ALBUMIN 4.1   No results for input(s): LIPASE, AMYLASE in the last 168 hours. No results for input(s): AMMONIA in the last 168 hours. Coagulation Profile:  Recent Labs Lab 05/02/16 1445  INR 1.15   Cardiac Enzymes:  Recent Labs Lab 05/02/16 1445  TROPONINI <0.03   BNP (last 3 results) No results for input(s): PROBNP in the last 8760 hours. HbA1C: No results for input(s): HGBA1C in the last 72 hours. CBG:  Recent Labs Lab 05/03/16 1139 05/03/16 1658 05/03/16 2113 05/04/16 0721 05/04/16 1159  GLUCAP 165* 193* 137* 160* 323*   Lipid Profile:  Recent Labs  05/03/16 0545  CHOL 110  HDL 34*  LDLCALC 60  TRIG 81  CHOLHDL 3.2   Thyroid Function Tests:  Recent Labs  05/02/16 1445  TSH 0.720   Anemia Panel: No results for input(s): VITAMINB12, FOLATE, FERRITIN, TIBC, IRON, RETICCTPCT in the last 72 hours. Sepsis Labs: No results for input(s): PROCALCITON, LATICACIDVEN in the last 168 hours.  No results found for this or any previous visit (from the past 240 hour(s)).       Radiology Studies: Ct Angio Head W/cm &/or Wo Cm  Result Date: 05/02/2016 CLINICAL DATA:  Dizziness beginning last night. Worse in this morning. Gait disturbance. Previous strokes. EXAM: CT ANGIOGRAPHY HEAD AND NECK TECHNIQUE: Multidetector CT imaging of the head and neck was performed using the standard  protocol during bolus administration of intravenous contrast. Multiplanar CT image reconstructions and MIPs were obtained to evaluate the vascular anatomy. Carotid stenosis measurements (when applicable) are obtained utilizing NASCET criteria, using the distal internal carotid diameter as the denominator. CONTRAST:  75 cc Isovue 370 COMPARISON:  CT earlier same day. MRI 02/28/2015. CT angiography 02/28/2015. FINDINGS: CTA NECK FINDINGS Aortic arch: Normal Right carotid system: Common carotid artery widely patent to the bifurcation region. Carotid bifurcation is normal without visible atherosclerosis. Cervical ICA is normal. Left carotid system: Common carotid artery widely patent to the bifurcation region. Carotid bifurcation is normal without atherosclerosis. Cervical internal carotid artery is normal. Vertebral arteries: Right vertebral artery dominant. Both vertebral artery origins widely patent. Both vertebral arteries widely patent through the cervical region. Skeleton: Ordinary cervical spondylosis.  Previous ACDF C5-6. Other  neck: No soft tissue lesion. Upper chest: Normal Review of the MIP images confirms the above findings CTA HEAD FINDINGS Anterior circulation: Both internal carotid arteries are widely patent through the siphon region. The anterior and middle cerebral arteries are patent bilaterally, but there is chronic stenosis of the posterior left M2 branch. Posterior circulation: Both vertebral arteries are widely patent at the foramen magnum level. There is atherosclerosis of the distal right vertebral artery with mild ectasia. No stenosis. No focal left vertebral finding. Basilar artery is widely patent. Posterior circulation branch vessels are normal. Venous sinuses: Patent and normal Anatomic variants: None significant Delayed phase: No abnormal enhancement Review of the MIP images confirms the above findings IMPRESSION: No atherosclerotic disease in the upper chest or the neck. Mild narrowing of  the posterior left M2 branch as seen previously. No evidence of disease progression. No evidence of acute embolic occlusion. 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 Electronically Signed   By: Nelson Chimes M.D.   On: 05/02/2016 16:44   Dg Chest 2 View  Result Date: 05/02/2016 CLINICAL DATA:  Dizziness since last night. EXAM: CHEST  2 VIEW COMPARISON:  None. FINDINGS: The heart size and mediastinal contours are within normal limits. There is no focal infiltrate, pulmonary edema, or pleural effusion. The visualized skeletal structures are unremarkable. IMPRESSION: No active cardiopulmonary disease. Electronically Signed   By: Abelardo Diesel M.D.   On: 05/02/2016 19:58   Ct Head Wo Contrast  Result Date: 05/02/2016 CLINICAL DATA:  Dizziness. EXAM: CT HEAD WITHOUT CONTRAST TECHNIQUE: Contiguous axial images were obtained from the base of the skull through the vertex without intravenous contrast. COMPARISON:  MRI 02/28/2015 FINDINGS: Brain: Old infarcts in the right cerebellar hemisphere. Old lacunar infarct in the left basal ganglia. No acute intracranial abnormality. Specifically, no hemorrhage, hydrocephalus, mass lesion, acute infarction, or significant intracranial injury. Vascular: No hyperdense vessel or unexpected calcification. Skull: No acute calvarial abnormality. Sinuses/Orbits: Visualized paranasal sinuses and mastoids clear. Orbital soft tissues unremarkable. Other: None IMPRESSION: Old right cerebellar infarcts and lacunar infarct in the left basal ganglia. No acute intracranial abnormality. Electronically Signed   By: Rolm Baptise M.D.   On: 05/02/2016 15:34   Ct Angio Neck W Or Wo Contrast  Result Date: 05/02/2016 CLINICAL DATA:  Dizziness beginning last night. Worse in this morning. Gait disturbance. Previous strokes. EXAM: CT ANGIOGRAPHY HEAD AND NECK TECHNIQUE: Multidetector CT imaging of the head and neck was performed  using the standard protocol during bolus administration of intravenous contrast. Multiplanar CT image reconstructions and MIPs were obtained to evaluate the vascular anatomy. Carotid stenosis measurements (when applicable) are obtained utilizing NASCET criteria, using the distal internal carotid diameter as the denominator. CONTRAST:  75 cc Isovue 370 COMPARISON:  CT earlier same day. MRI 02/28/2015. CT angiography 02/28/2015. FINDINGS: CTA NECK FINDINGS Aortic arch: Normal Right carotid system: Common carotid artery widely patent to the bifurcation region. Carotid bifurcation is normal without visible atherosclerosis. Cervical ICA is normal. Left carotid system: Common carotid artery widely patent to the bifurcation region. Carotid bifurcation is normal without atherosclerosis. Cervical internal carotid artery is normal. Vertebral arteries: Right vertebral artery dominant. Both vertebral artery origins widely patent. Both vertebral arteries widely patent through the cervical region. Skeleton: Ordinary cervical spondylosis.  Previous ACDF C5-6. Other neck: No soft tissue lesion. Upper chest: Normal Review of the MIP images confirms the above findings CTA HEAD FINDINGS Anterior circulation: Both internal carotid arteries are widely  patent through the siphon region. The anterior and middle cerebral arteries are patent bilaterally, but there is chronic stenosis of the posterior left M2 branch. Posterior circulation: Both vertebral arteries are widely patent at the foramen magnum level. There is atherosclerosis of the distal right vertebral artery with mild ectasia. No stenosis. No focal left vertebral finding. Basilar artery is widely patent. Posterior circulation branch vessels are normal. Venous sinuses: Patent and normal Anatomic variants: None significant Delayed phase: No abnormal enhancement Review of the MIP images confirms the above findings IMPRESSION: No atherosclerotic disease in the upper chest or the neck.  Mild narrowing of the posterior left M2 branch as seen previously. No evidence of disease progression. No evidence of acute embolic occlusion. 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 Electronically Signed   By: Nelson Chimes M.D.   On: 05/02/2016 16:44   Mr Brain Wo Contrast  Result Date: 05/04/2016 CLINICAL DATA:  56 year old male with altered mental status, weakness, dizziness. Initial encounter. EXAM: MRI HEAD WITHOUT CONTRAST TECHNIQUE: Multiplanar, multiecho pulse sequences of the brain and surrounding structures were obtained without intravenous contrast. COMPARISON:  CTA head and neck 05/02/2016.  Brain MRI 02/28/2015. FINDINGS: Brain: 5 mm focus of confluent restricted diffusion along the dorsal left medulla (series 4, image 58). Associated T2 and FLAIR hyperintensity with no hemorrhage or mass effect. No other restricted diffusion. Chronic small infarcts in both cerebellar hemispheres, including the right SCA territory. No chronic ischemia evident in the brainstem. Chronic lacune of the left basal ganglia. Small area of chronic cortical encephalomalacia in the anterior right frontal operculum with mild white matter gliosis. Outside of the acute finding gray and white matter signal is stable. No chronic cerebral blood products. No midline shift, mass effect, evidence of mass lesion, ventriculomegaly, extra-axial collection or acute intracranial hemorrhage. Cervicomedullary junction and pituitary are within normal limits. Vascular: Major intracranial vascular flow voids are stable with generalized intracranial artery dolichoectasia. Skull and upper cervical spine: Negative. Sinuses/Orbits: Stable and negative orbits soft tissues. Visualized paranasal sinuses and mastoids are stable and well pneumatized. , mild ethmoid mucosal thickening. Other: Visible internal auditory structures appear normal. Negative scalp soft tissues. IMPRESSION: 1.  Small acute lacunar infarct in the left dorsal medulla. No associated hemorrhage or mass effect. 2. Otherwise stable chronic small and medium-sized vessel ischemia with both anterior and posterior circulation involvement since 2016. Electronically Signed   By: Genevie Ann M.D.   On: 05/04/2016 10:41   US Carotid Bilateral (at [facility] Only)  Result Date: 05/03/2016 CLINICAL DATA:  TIA for the past day. History of hypertension, syncopal episode, hyperlipidemia and diabetes. EXAM: BILATERAL CAROTID DUPLEX ULTRASOUND TECHNIQUE: Pearline Cables scale imaging, color Doppler and duplex ultrasound were performed of bilateral carotid and vertebral arteries in the neck. COMPARISON:  Neck CTA - 05/02/2016 FINDINGS: Criteria: Quantification of carotid stenosis is based on velocity parameters that correlate the residual internal carotid diameter with NASCET-based stenosis levels, using the diameter of the distal internal carotid lumen as the denominator for stenosis measurement. The following velocity measurements were obtained: RIGHT ICA:  68/25 cm/sec CCA:  0000000 cm/sec SYSTOLIC ICA/CCA RATIO:  0.6 DIASTOLIC ICA/CCA RATIO:  1.0 ECA:  116 cm/sec LEFT ICA:  69/16 cm/sec CCA:  [id number] cm/sec SYSTOLIC ICA/CCA RATIO:  0.6 DIASTOLIC ICA/CCA RATIO:  0.6 ECA:  96 cm/sec RIGHT CAROTID ARTERY: There is no grayscale evidence of significant intimal thickening or atherosclerotic plaque affecting interrogated portions of the right internal  carotid artery. There are no elevated peak systolic velocities within the interrogated course the right internal carotid artery to suggest a hemodynamically significant stenosis. RIGHT VERTEBRAL ARTERY:  Antegrade Flow LEFT CAROTID ARTERY: There is no grayscale evidence of intimal thickening or atherosclerotic plaque affecting interrogated portions of the left carotid system. There are no elevated peak systolic velocities within the interrogated course the left internal carotid artery to suggest a hemodynamically  significant stenosis. LEFT VERTEBRAL ARTERY:  Antegrade Flow IMPRESSION: Normal carotid Doppler ultrasound. Electronically Signed   By: Sandi Mariscal M.D.   On: 05/03/2016 10:01        Scheduled Meds: . aspirin  300 mg Rectal Daily   Or  . aspirin  325 mg Oral Daily  . atorvastatin  40 mg Oral Daily  . feeding supplement (GLUCERNA SHAKE)  237 mL Oral TID BM  . folic acid  1 mg Oral Daily  . insulin aspart  0-15 Units Subcutaneous TID WC  . insulin aspart  0-5 Units Subcutaneous QHS  . pregabalin  100 mg Oral TID  . topiramate  50 mg Oral BID  . valACYclovir  1,000 mg Oral Daily   Continuous Infusions: . sodium chloride 50 mL/hr at 05/02/16 2107     LOS: 0 days    Time spent: 33mins    Mckenze Slone, MD [facility] Pager [phone]  If 7PM-7AM, please contact night-coverage [email or web address] Password [facility] 05/04/2016, 12:24 PM

## 2016-05-04 NOTE — Telephone Encounter (Signed)
PT wife called wanting to know the results of MRI and that Dr. Rae Roam is supposed to see pt at [facility]. Pt is currently admitted at [facility]. Would like to know if pt needs a CPAP- states that the pt has had a sleep study last year

## 2016-05-04 NOTE — Consult Note (Signed)
[place] A. Travis Laughter, MD     [email or web address]          Travis [name] is an 56 y.o. male.   ASSESSMENT/PLAN: Unexplained acute onset of dizziness and the gait ataxia. The patient does have a history of infarcts in the past and history of atrial fibrillation. I suspect this is most likely situation. No clear metabolic derangements have been uncovered. Medication effect is also possibility but nothing really stands out.  Chronic headache with acute worsening in the last 3 days on presentation.  Chronic neck and back pain which may have also gotten worse the last 3 days.  The patient seemed to have symptoms of possibly flu body aches and chills and constitutional symptoms which may explain his worsening over the last 3 days.  The patient has a brain MRI which is pending. We will follow up this results in follow-up his overall clinical picture. Discussions were made about the possibility of doing a spinal tap if the imaging is unrevealing. A lot of his symptoms are chronic so it is unlikely that the patient has acute bacterial meningitis.   Also consider checking for flu.    The patient has a complicated medical history. He has had several episodes of neurological symptoms which has been worked up as outlined below. He does have baseline dysarthria from a documented cerebral stroke. The patient has chronic headaches, neck pain or low back pain. In fact we started patient on Topamax over a year ago because of his chronic headaches. Appears her responded to this. The patient presented with the acute onset of gait ataxia lean towards the left and dizziness described as a lightheaded sensation. His baseline dysarthria apparently has remained unchanged. He denies focal numbness or weakness. He does not report having chest pain, dysarthria, dysphagia GI GU symptoms. He has been compliant with all his medications. The patient however reports having neck pain, back pain and  headaches that have been chronic for at least the last 3 months. He reports that his headache symptoms got worse the last 3 days. The patient reports some new symptoms with body aches and chills and nasal congestion over the last 3 days.  GENERAL: He is doing fairly well although he appears to be in some discomfort from his symptoms.  HEENT: Supple. Atraumatic normocephalic. He has significant nasal congestion.  ABDOMEN: soft  EXTREMITIES: No edema   BACK: Normal.  SKIN: Normal by inspection.    MENTAL STATUS: Alert and oriented. Speech - moderately dysarthric which is his baseline; language and cognition are generally intact. Judgment and insight normal.   CRANIAL NERVES: Pupils are equal, round and reactive to light and accommodation; extra ocular movements are full, there is no significant nystagmus; visual fields are full; upper and lower facial muscles are normal in strength and symmetric, there is no flattening of the nasolabial folds; tongue is midline; uvula is midline; shoulder elevation is normal.  MOTOR: Normal tone, bulk and strength; no pronator drift.  COORDINATION: Left finger to nose is normal, right finger to nose is normal, No rest tremor; no intention tremor; no postural tremor; no bradykinesia.  REFLEXES: Deep tendon reflexes are symmetrical and normal.  SENSATION: Normal to light touch.           GNA NOTE [[[[[[[[[[[[[[[[[[[[[[[In summary, Travis [name] is a 56 y.o. male with PMH of HTN, HLD, DM and HA was admitted to [facility] for dizzy spells. MRI showed old lacunar strokes involving b/l cerebrellar and left  caudate head as well as possible right frontal cortex, but no acute infarct. Other stroke work up all negative including MRA, CUS, EEG, TTE, only hemocysteine 19.1 and LDL 76. He was continued on ASA and pravastatin. CTA neck showed possible left V1 stenosis. However, he was admitted again on 02/28/15 for right SCA cerebellar infarct, had stroke work  up again with TTE, TEE, hypercoagulable work up and 30 day cardiac event monitoring all negative except small PFO. He was put on dual antiplatelet and lipitor. EMG/NCS unremarkable. Continue topamax for headache prophylaxis.  Sleep study showed mild OSA and CPAP recommended. 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 Loop recorder showed afib in 02/2016. He was put on eliquis 79m bid and DAPT discontinued. ]]]]]]]]]]]]]]]]]]]]]]]]       Blood pressure 126/82, pulse 83, temperature 98.5 F (36.9 C), temperature source Oral, resp. rate 16, height '6\' 1"'  (1.854 m), weight 169 lb 1.5 oz (76.7 kg), SpO2 98 %.  Past Medical History:  Diagnosis Date  . A-fib ([facility] 02/2016   found on loop recorder  . 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 Anxiety   . Arthritis   . Blood transfusion without reported diagnosis   . Chronic headaches   . Complication of anesthesia    bleed after last shoulder-had to stay overnight  . Depression   . Diabetes mellitus   . Diabetic peripheral neuropathy ([facility] 03/28/2013  . Diverticulosis   . Dizziness   . Hernia, inguinal   . Hyperlipidemia   . Hypertension   . Impingement syndrome of left shoulder 08/02/2014  . 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 Neuropathy of lower extremity   . Night sweats    every once in a while  . Sleep apnea    no CPAP  . Stroke ([facility] 02/2015  . Syncope and collapse     Past Surgical History:  Procedure Laterality Date  . CERVICAL DISC SURGERY    . COLON SURGERY  2003   1/3 removed for diverticulitis  . COLONOSCOPY    . EP IMPLANTABLE DEVICE N/A 05/01/2015   Procedure: Loop Recorder Insertion;  Surgeon: SDeboraha Sprang MD;  [facility];  Service: Cardiovascular;  Laterality: N/A;  . INGUINAL HERNIA REPAIR Bilateral   . KNEE ARTHROSCOPY Right   . SHOULDER ARTHROSCOPY Right    1  . SHOULDER ARTHROSCOPY Left 08/02/2014   Procedure: LEFT SHOULDER SCOPE DEBRIDEMENT/ACROMIOPLASTY;  Surgeon:  JMarchia Bond MD;  [facility]  Service: Orthopedics;  Laterality: Left;  ANESTHESIA: GENERAL, PRE/POST OP SCALENE  . TEE WITHOUT CARDIOVERSION N/A 03/03/2015   Procedure: TRANSESOPHAGEAL ECHOCARDIOGRAM (TEE);  Surgeon: SHerminio Commons MD;  [facility];  Service: Cardiology;  Laterality: N/A;  . UMBILICAL HERNIA REPAIR     with other hernia repair with mesh  . WRIST SURGERY Right    fusion    Family History  Problem Relation Age of Onset  . Stroke Father   . Hyperlipidemia Father   . Heart attack Sister 459 . Dementia Mother   . Diabetes Maternal Grandfather   . ALS Brother 471 . Colon cancer Neg Hx   . Esophageal cancer Neg Hx   . Stomach cancer Neg Hx   . Rectal cancer Neg Hx     Social History:  reports that he quit smoking about 38 years ago. His smoking use included Cigarettes. He started smoking about 45 years ago. He has a 8.00 pack-year  smoking history. He quit smokeless tobacco use about 38 years ago. His smokeless tobacco use included Chew. He reports that he does not drink alcohol or use drugs.  Allergies: No Known Allergies  Medications: Prior to Admission medications   Medication Sig Start Date End Date Taking? Authorizing Provider  apixaban (ELIQUIS) 5 MG TABS tablet Take 1 tablet (5 mg total) by mouth 2 (two) times daily. 04/22/16  Yes Sherran Needs, NP  atorvastatin (LIPITOR) 20 MG tablet TAKE 1 TABLET BY MOUTH ONCE DAILY 03/05/16  Yes Kathyrn Drown, MD  Cholecalciferol (VITAMIN D) 400 UNITS capsule Take 400 Units by mouth daily.     Yes Historical Provider, MD  Cyanocobalamin (VITAMIN B12 PO) Take by mouth daily.     Yes Historical Provider, MD  folic acid (FOLVITE) 1 MG tablet TAKE 1 TABLET BY MOUTH DAILY. 03/18/16  Yes Kathyrn Drown, MD  lisinopril (PRINIVIL,ZESTRIL) 2.5 MG tablet Take 1 tablet (2.5 mg total) by mouth every morning. 02/25/16  Yes Mikey Kirschner, MD  metFORMIN (GLUCOPHAGE) 500 MG tablet TAKE 1  TABLETS BY MOUTH 2 TIMES DAILY WITH A MEAL. 02/26/16  Yes Kathyrn Drown, MD  pregabalin (LYRICA) 100 MG capsule Take 1 capsule (100 mg total) by mouth 3 (three) times daily. 02/13/16  Yes Mikey Kirschner, MD  topiramate (TOPAMAX) 25 MG tablet TAKE 2 TABLETS BY MOUTH TWICE DAILY 09/22/15  Yes Rosalin Hawking, MD  traMADol (ULTRAM) 50 MG tablet Take 1 tablet (50 mg total) by mouth 3 (three) times daily as needed for moderate pain. 02/25/16  Yes Mikey Kirschner, MD  valACYclovir (VALTREX) 1000 MG tablet TAKE 1 TABLET BY MOUTH DAILY. 01/16/16  Yes Kathyrn Drown, MD    Scheduled Meds: . apixaban  5 mg Oral BID  . aspirin  300 mg Rectal Daily   Or  . aspirin  325 mg Oral Daily  . atorvastatin  40 mg Oral Daily  . feeding supplement (GLUCERNA SHAKE)  237 mL Oral TID BM  . folic acid  1 mg Oral Daily  . insulin aspart  0-15 Units Subcutaneous TID WC  . insulin aspart  0-5 Units Subcutaneous QHS  . LORazepam  1 mg Intravenous 30 min Pre-Op  . pregabalin  100 mg Oral TID  . topiramate  50 mg Oral BID  . valACYclovir  1,000 mg Oral Daily   Continuous Infusions: . sodium chloride 50 mL/hr at 05/02/16 2107   PRN Meds:.acetaminophen **OR** acetaminophen (TYLENOL) oral liquid 160 mg/5 mL **OR** acetaminophen, HYDROcodone-acetaminophen, senna-docusate, traMADol     Results for orders placed or performed during the hospital encounter of 05/02/16 (from the past 48 hour(s))  Protime-INR     Status: None   Collection Time: 05/02/16  2:45 PM  Result Value Ref Range   Prothrombin Time 14.8 11.4 - 15.2 seconds   INR 1.15   APTT     Status: None   Collection Time: 05/02/16  2:45 PM  Result Value Ref Range   aPTT 30 24 - 36 seconds  CBC     Status: None   Collection Time: 05/02/16  2:45 PM  Result Value Ref Range   WBC 7.0 4.0 - 10.5 K/uL   RBC 4.43 4.22 - 5.81 MIL/uL   Hemoglobin 14.7 13.0 - 17.0 g/dL   HCT 42.0 39.0 - 52.0 %   MCV 94.8 78.0 - 100.0 fL   MCH 33.2 26.0 - 34.0 pg   MCHC 35.0 30.0  - 36.0 g/dL  RDW 12.1 11.5 - 15.5 %   Platelets 169 150 - 400 K/uL  Differential     Status: None   Collection Time: 05/02/16  2:45 PM  Result Value Ref Range   Neutrophils Relative % 76 %   Neutro Abs 5.3 1.7 - 7.7 K/uL   Lymphocytes Relative 15 %   Lymphs Abs 1.0 0.7 - 4.0 K/uL   Monocytes Relative 8 %   Monocytes Absolute 0.5 0.1 - 1.0 K/uL   Eosinophils Relative 1 %   Eosinophils Absolute 0.1 0.0 - 0.7 K/uL   Basophils Relative 0 %   Basophils Absolute 0.0 0.0 - 0.1 K/uL  Comprehensive metabolic panel     Status: Abnormal   Collection Time: 05/02/16  2:45 PM  Result Value Ref Range   Sodium 136 135 - 145 mmol/L   Potassium 3.7 3.5 - 5.1 mmol/L   Chloride 106 101 - 111 mmol/L   CO2 24 22 - 32 mmol/L   Glucose, Bld 289 (H) 65 - 99 mg/dL   BUN 11 6 - 20 mg/dL   Creatinine, Ser 0.90 0.61 - 1.24 mg/dL   Calcium 9.0 8.9 - 10.3 mg/dL   Total Protein 7.2 6.5 - 8.1 g/dL   Albumin 4.1 3.5 - 5.0 g/dL   AST 21 15 - 41 U/L   ALT 14 (L) 17 - 63 U/L   Alkaline Phosphatase 62 38 - 126 U/L   Total Bilirubin 1.0 0.3 - 1.2 mg/dL   GFR calc non Af Amer >60 >60 mL/min   GFR calc Af Amer >60 >60 mL/min    Comment: (NOTE) The eGFR has been calculated using the CKD EPI equation. This calculation has not been validated in all clinical situations. eGFR's persistently <60 mL/min signify possible Chronic Kidney Disease.    Anion gap 6 5 - 15  Troponin I     Status: None   Collection Time: 05/02/16  2:45 PM  Result Value Ref Range   Troponin I <0.03 <0.03 ng/mL  TSH     Status: None   Collection Time: 05/02/16  2:45 PM  Result Value Ref Range   TSH 0.720 0.350 - 4.500 uIU/mL    Comment: Performed by a 3rd Generation assay with a functional sensitivity of <=0.01 uIU/mL.  CBG monitoring, ED     Status: Abnormal   Collection Time: 05/02/16  2:49 PM  Result Value Ref Range   Glucose-Capillary 299 (H) 65 - 99 mg/dL   Comment 1 Notify RN   I-Stat Chem 8, ED     Status: Abnormal    Collection Time: 05/02/16  2:58 PM  Result Value Ref Range   Sodium 141 135 - 145 mmol/L   Potassium 3.7 3.5 - 5.1 mmol/L   Chloride 105 101 - 111 mmol/L   BUN 10 6 - 20 mg/dL   Creatinine, Ser 0.90 0.61 - 1.24 mg/dL   Glucose, Bld 286 (H) 65 - 99 mg/dL   Calcium, Ion 1.20 1.15 - 1.40 mmol/L   TCO2 23 0 - 100 mmol/L   Hemoglobin 13.9 13.0 - 17.0 g/dL   HCT 41.0 39.0 - 52.0 %  Glucose, capillary     Status: Abnormal   Collection Time: 05/02/16  9:02 PM  Result Value Ref Range   Glucose-Capillary 129 (H) 65 - 99 mg/dL   Comment 1 Notify RN    Comment 2 Document in Chart   Urine rapid drug screen (hosp performed)not at [facility]     Status: Abnormal  Collection Time: 05/03/16  2:40 AM  Result Value Ref Range   Opiates NONE DETECTED NONE DETECTED   Cocaine NONE DETECTED NONE DETECTED   Benzodiazepines NONE DETECTED NONE DETECTED   Amphetamines NONE DETECTED NONE DETECTED   Tetrahydrocannabinol POSITIVE (A) NONE DETECTED   Barbiturates NONE DETECTED NONE DETECTED    Comment:        DRUG SCREEN FOR MEDICAL PURPOSES ONLY.  IF CONFIRMATION IS NEEDED FOR ANY PURPOSE, NOTIFY LAB WITHIN 5 DAYS.        LOWEST DETECTABLE LIMITS FOR URINE DRUG SCREEN Drug Class       Cutoff (ng/mL) Amphetamine      1000 Barbiturate      200 Benzodiazepine   622 Tricyclics       297 Opiates          300 Cocaine          300 THC              50   Lipid panel     Status: Abnormal   Collection Time: 05/03/16  5:45 AM  Result Value Ref Range   Cholesterol 110 0 - 200 mg/dL   Triglycerides 81 <150 mg/dL   HDL 34 (L) >40 mg/dL   Total CHOL/HDL Ratio 3.2 RATIO   VLDL 16 0 - 40 mg/dL   LDL Cholesterol 60 0 - 99 mg/dL    Comment:        Total Cholesterol/HDL:CHD Risk Coronary Heart Disease Risk Table                     Men   Women  1/2 Average Risk   3.4   3.3  Average Risk       5.0   4.4  2 X Average Risk   9.6   7.1  3 X Average Risk  23.4   11.0        Use the calculated Patient Ratio above  and the CHD Risk Table to determine the patient's CHD Risk.        ATP III CLASSIFICATION (LDL):  <100     mg/dL   Optimal  100-129  mg/dL   Near or Above                    Optimal  130-159  mg/dL   Borderline  160-189  mg/dL   High  >190     mg/dL   Very High   Glucose, capillary     Status: Abnormal   Collection Time: 05/03/16  8:08 AM  Result Value Ref Range   Glucose-Capillary 188 (H) 65 - 99 mg/dL   Comment 1 Notify RN    Comment 2 Document in Chart   Urinalysis, Routine w reflex microscopic     Status: Abnormal   Collection Time: 05/03/16 10:05 AM  Result Value Ref Range   Color, Urine YELLOW YELLOW   APPearance CLOUDY (A) CLEAR   Specific Gravity, Urine 1.015 1.005 - 1.030   pH 8.0 5.0 - 8.0   Glucose, UA NEGATIVE NEGATIVE mg/dL   Hgb urine dipstick NEGATIVE NEGATIVE   Bilirubin Urine NEGATIVE NEGATIVE   Ketones, ur NEGATIVE NEGATIVE mg/dL   Protein, ur TRACE (A) NEGATIVE mg/dL   Nitrite NEGATIVE NEGATIVE   Leukocytes, UA NEGATIVE NEGATIVE  Urinalysis, Microscopic (reflex)     Status: Abnormal   Collection Time: 05/03/16 10:05 AM  Result Value Ref Range   RBC / HPF 0-5 0 -  5 RBC/hpf   WBC, UA NONE SEEN 0 - 5 WBC/hpf   Bacteria, UA MANY (A) NONE SEEN   Squamous Epithelial / LPF NONE SEEN NONE SEEN  Glucose, capillary     Status: Abnormal   Collection Time: 05/03/16 11:39 AM  Result Value Ref Range   Glucose-Capillary 165 (H) 65 - 99 mg/dL   Comment 1 Notify RN    Comment 2 Document in Chart   Glucose, capillary     Status: Abnormal   Collection Time: 05/03/16  4:58 PM  Result Value Ref Range   Glucose-Capillary 193 (H) 65 - 99 mg/dL  Glucose, capillary     Status: Abnormal   Collection Time: 05/03/16  9:13 PM  Result Value Ref Range   Glucose-Capillary 137 (H) 65 - 99 mg/dL   Comment 1 Notify RN    Comment 2 Document in Chart   CBC     Status: None   Collection Time: 05/04/16  5:07 AM  Result Value Ref Range   WBC 6.5 4.0 - 10.5 K/uL   RBC 4.47 4.22 -  5.81 MIL/uL   Hemoglobin 15.0 13.0 - 17.0 g/dL   HCT 43.0 39.0 - 52.0 %   MCV 96.2 78.0 - 100.0 fL   MCH 33.6 26.0 - 34.0 pg   MCHC 34.9 30.0 - 36.0 g/dL   RDW 12.3 11.5 - 15.5 %   Platelets 158 150 - 400 K/uL  Basic metabolic panel     Status: Abnormal   Collection Time: 05/04/16  5:07 AM  Result Value Ref Range   Sodium 136 135 - 145 mmol/L   Potassium 3.9 3.5 - 5.1 mmol/L   Chloride 106 101 - 111 mmol/L   CO2 23 22 - 32 mmol/L   Glucose, Bld 171 (H) 65 - 99 mg/dL   BUN 15 6 - 20 mg/dL   Creatinine, Ser 0.96 0.61 - 1.24 mg/dL   Calcium 8.8 (L) 8.9 - 10.3 mg/dL   GFR calc non Af Amer >60 >60 mL/min   GFR calc Af Amer >60 >60 mL/min    Comment: (NOTE) The eGFR has been calculated using the CKD EPI equation. This calculation has not been validated in all clinical situations. eGFR's persistently <60 mL/min signify possible Chronic Kidney Disease.    Anion gap 7 5 - 15  Glucose, capillary     Status: Abnormal   Collection Time: 05/04/16  7:21 AM  Result Value Ref Range   Glucose-Capillary 160 (H) 65 - 99 mg/dL   Comment 1 Notify RN    Comment 2 Document in Chart     Studies/Results:  CAROTID DOPPLERS Normal carotid Doppler ultrasound.      HEAD NECK CTA FINDINGS: CTA NECK FINDINGS  Aortic arch: Normal  Right carotid system: Common carotid artery widely patent to the bifurcation region. Carotid bifurcation is normal without visible atherosclerosis. Cervical ICA is normal.  Left carotid system: Common carotid artery widely patent to the bifurcation region. Carotid bifurcation is normal without atherosclerosis. Cervical internal carotid artery is normal.  Vertebral arteries: Right vertebral artery dominant. Both vertebral artery origins widely patent. Both vertebral arteries widely patent through the cervical region.  Skeleton: Ordinary cervical spondylosis.  Previous ACDF C5-6.  Other neck: No soft tissue lesion.  Upper chest: Normal  Review of  the MIP images confirms the above findings  CTA HEAD FINDINGS  Anterior circulation: Both internal carotid arteries are widely patent through the siphon region. The anterior and middle cerebral arteries are patent  bilaterally, but there is chronic stenosis of the posterior left M2 branch.  Posterior circulation: Both vertebral arteries are widely patent at the foramen magnum level. There is atherosclerosis of the distal right vertebral artery with mild ectasia. No stenosis. No focal left vertebral finding. Basilar artery is widely patent. Posterior circulation branch vessels are normal.  Venous sinuses: Patent and normal  Anatomic variants: None significant  Delayed phase: No abnormal enhancement  Review of the MIP images confirms the above findings  IMPRESSION: No atherosclerotic disease in the upper chest or the neck.  Mild narrowing of the posterior left M2 branch as seen previously. No evidence of disease progression. No evidence of acute embolic occlusion.  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         HEAD CTA FINDINGS: Brain: Old infarcts in the right cerebellar hemisphere. Old lacunar infarct in the left basal ganglia. No acute intracranial abnormality. Specifically, no hemorrhage, hydrocephalus, mass lesion, acute infarction, or significant intracranial injury.  Vascular: No hyperdense vessel or unexpected calcification.  Skull: No acute calvarial abnormality.  Sinuses/Orbits: Visualized paranasal sinuses and mastoids clear. Orbital soft tissues unremarkable.  Other: None  IMPRESSION: Old right cerebellar infarcts and lacunar infarct in the left basal ganglia.  No acute intracranial abnormality.             Raykwon Hobbs A. Travis [name], M.D.  Diplomate, Tax adviser of Psychiatry and Neurology ( Neurology). 05/04/2016, 7:59 AM

## 2016-05-04 NOTE — Telephone Encounter (Signed)
MRI showed a small stroke it is wise for family to follow through with what ever the hospitalists has to say. As for the sleep study the neurologist did a sleep study back in 2016 in recommended a CPAP machine in according to their notes they were setting this up. I do not have any information on what has come of it since then. He has mild sleep apnea and a sleep-CPAP machine would be beneficial. If he never got his CPAP machine this could be set up after he gets out of the hospital or the hospitalists can put in referral for advance home care to help set up CPAP machine

## 2016-05-04 NOTE — Care Management Note (Signed)
Case Management Note  Patient Details  Name: Travis [name] MRN: [id number] Date of Birth: [birth year]  Subjective/Objective:    Patient adm from home with dizziness, CT negative, MRI pending. He is from home with wife, walks with cane. PT recommends OP PT. Patient has been to [facility] previously and would like to use them again.       Action/Plan: Referral made to [facility] for balance training. No other CM needs.    Expected Discharge Date:      05/05/2015          Expected Discharge Plan:  Home/Self Care (with OP PT)  In-House Referral:  NA  Discharge planning Services  CM Consult  Post Acute Care Choice:  NA Choice offered to:  NA  DME Arranged:    DME Agency:     HH Arranged:    HH Agency:     Status of Service:  Completed, signed off  If discussed at [employer] of Stay Meetings, dates discussed:    Additional Comments:  Carrel Leather, Chauncey Reading, RN 05/04/2016, 12:26 PM

## 2016-05-04 NOTE — Telephone Encounter (Signed)
PT wife called wanting to know the results of MRI and that Dr. Rae Roam is supposed to see pt at [facility]. Pt is currently admitted at [facility]. Would like to know if pt needs a CPAP-  states that the pt has had a sleep study last year.

## 2016-05-04 NOTE — Telephone Encounter (Signed)
Notified wife ([name]) MRI showed a small stroke it is wise for family to follow through with what ever the hospitalists has to say. As for the sleep study the neurologist did a sleep study back in 2016 in recommended a CPAP machine in according to their notes they were setting this up. Dr. Nicki Reaper does not have any information on what has come of it since then. He has mild sleep apnea and a sleep-CPAP machine would be beneficial. If he never got his CPAP machine this could be set up after he gets out of the hospital or the hospitalists can put in referral for advance home care to help set up CPAP machine. Wife verbalized understanding.

## 2016-05-05 ENCOUNTER — Inpatient Hospital Stay (HOSPITAL_COMMUNITY): Payer: 59

## 2016-05-05 DIAGNOSIS — E1159 Type 2 diabetes mellitus with other circulatory complications: Secondary | ICD-10-CM

## 2016-05-05 DIAGNOSIS — I639 Cerebral infarction, unspecified: Principal | ICD-10-CM

## 2016-05-05 DIAGNOSIS — R42 Dizziness and giddiness: Secondary | ICD-10-CM

## 2016-05-05 LAB — GLUCOSE, CAPILLARY
GLUCOSE-CAPILLARY: 179 mg/dL — AB (ref 65–99)
GLUCOSE-CAPILLARY: 296 mg/dL — AB (ref 65–99)
Glucose-Capillary: 177 mg/dL — ABNORMAL HIGH (ref 65–99)
Glucose-Capillary: 296 mg/dL — ABNORMAL HIGH (ref 65–99)

## 2016-05-05 LAB — RESPIRATORY PANEL BY PCR
ADENOVIRUS-RVPPCR: NOT DETECTED
BORDETELLA PERTUSSIS-RVPCR: NOT DETECTED
CHLAMYDOPHILA PNEUMONIAE-RVPPCR: NOT DETECTED
CORONAVIRUS NL63-RVPPCR: NOT DETECTED
Coronavirus 229E: NOT DETECTED
Coronavirus HKU1: NOT DETECTED
Coronavirus OC43: NOT DETECTED
INFLUENZA A-RVPPCR: NOT DETECTED
Influenza B: NOT DETECTED
Metapneumovirus: NOT DETECTED
Mycoplasma pneumoniae: NOT DETECTED
PARAINFLUENZA VIRUS 4-RVPPCR: NOT DETECTED
Parainfluenza Virus 1: NOT DETECTED
Parainfluenza Virus 2: NOT DETECTED
Parainfluenza Virus 3: NOT DETECTED
RHINOVIRUS / ENTEROVIRUS - RVPPCR: NOT DETECTED
Respiratory Syncytial Virus: DETECTED — AB

## 2016-05-05 LAB — LACTIC ACID, PLASMA: LACTIC ACID, VENOUS: 1 mmol/L (ref 0.5–1.9)

## 2016-05-05 MED ORDER — INSULIN ASPART 100 UNIT/ML ~~LOC~~ SOLN
3.0000 [IU] | Freq: Three times a day (TID) | SUBCUTANEOUS | Status: DC
  Administered 2016-05-05 – 2016-05-06 (×4): 3 [IU] via SUBCUTANEOUS

## 2016-05-05 NOTE — Progress Notes (Addendum)
PROGRESS NOTE    Travis [name]  [id number] DOB: Sep 27, 1960 DOA: 05/02/2016 PCP: [name], MD   Brief Narrative: 56 year old male with history of hypertension, diabetes and previous stroke. Presents to the hospital with complaints of dizziness and generalized weakness. He reports that he keeps leaning to the left while he is walking. Due to concerns of underlying CVA, he was referred for admission.  Assessment & Plan:   Principal Problem:   Dizziness Active Problems:   Diabetes type 2, controlled (HCC)   Cerebral infarction, chronic   Essential hypertension   HLD (hyperlipidemia)   OSA (obstructive sleep apnea)   Paroxysmal atrial fibrillation (HCC)   Anxiety and depression   Acute CVA (cerebrovascular accident) ([place])  # Dizziness and dysarthria likely in the setting of acute lacunar infarct in the left dorsal Medulla: -Patient has dysarthria, dizziness, difficulty finding words. MRI results reviewed and discussed with the neurologist today. -On aspirin, Lipitor. -Continue to hold Eliquis in case patient needs lumbar puncture. -Carotid Doppler normal -Echocardiogram with normal systolic function and grade 1 diastolic dysfunction. -PT, OT evaluation and treatment.  #hypertension: Blood pressure acceptable. Continue to monitor.  #Hyperlipidemia: On a statin. LDL 60  #Diabetes: A1c 5.4. Continue sliding scale. Monitor blood sugar table. Add NovoLog 3 units 3 times a day with meals for hyperglycemia.  #Atrial fibrillation, likely paroxysmal: Heart rate is controlled. Systemic anticoagulation on hold. Currently on aspirin.  # Acute Febrile illness: Exact source unknown likely upper respiratory tract infection, viral. Influenza negative. Checking respiratory viral panel today. Chest x-ray unremarkable. Denied urinary symptoms. Follow-up urine culture. Continue to provide supportive care. Follow-up blood culture results.  DVT prophylaxis: SCD. Aspirin. Holding Eliquis  now. Code Status: Full code Family Communication: No family present at bedside Disposition Plan: Likely discharge home in 1-2 days    Consultants:   Neurologist  Procedures: MRI brain, echo, carotid Doppler Antimicrobials: None  Subjective: Patient was seen and examined at bedside. She was complaining of headache which is stable. He has difficulty finding words and has dysarthria. Reports weakness. Denied nausea, vomiting, chest pain or shortness of breath. No diarrhea or constipation.   Objective: Vitals:   05/05/16 0920 05/05/16 0930 05/05/16 1220 05/05/16 1230  BP: 130/79 130/79 112/76 119/69  Pulse: 90 90 92 90  Resp: 20 20 20 20   Temp: 98.7 F (37.1 C) 98.7 F (37.1 C) 98.5 F (36.9 C) 98.5 F (36.9 C)  TempSrc: Oral Oral Oral Oral  SpO2: 96% 96% 98% 96%  Weight:      Height:        Intake/Output Summary (Last 24 hours) at 05/05/16 1424 Last data filed at 05/05/16 1200  Gross per 24 hour  Intake              720 ml  Output                0 ml  Net              720 ml   Filed Weights   05/02/16 1435 05/02/16 2020  Weight: 83.9 kg (185 lb) 76.7 kg (169 lb 1.5 oz)    Examination:  General exam: Appears calm and comfortable  Respiratory system: Clear to auscultation. Respiratory effort normal. No wheezing or crackle Cardiovascular system: S1 & S2 heard, RRR.  No pedal edema. Gastrointestinal system: Abdomen is nondistended, soft and nontender. Normal bowel sounds heard. Central nervous system: Alert Awake and following commands. Has dysarthria and difficulty finding words. Extremities: Symmetric  5 x 5 power. Skin: No rashes, lesions or ulcers Psychiatry: Judgement and insight appear normal.     Data Reviewed: I have personally reviewed following labs and imaging studies  CBC:  Recent Labs Lab 05/02/16 1445 05/02/16 1458 05/04/16 0507  WBC 7.0  --  6.5  NEUTROABS 5.3  --   --   HGB 14.7 13.9 15.0  HCT 42.0 41.0 43.0  MCV 94.8  --  96.2  PLT 169   --  0000000   Basic Metabolic Panel:  Recent Labs Lab 05/02/16 1445 05/02/16 1458 05/04/16 0507  NA 136 141 136  K 3.7 3.7 3.9  CL 106 105 106  CO2 24  --  23  GLUCOSE 289* 286* 171*  BUN 11 10 15   CREATININE 0.90 0.90 0.96  CALCIUM 9.0  --  8.8*   GFR: Estimated Creatinine Clearance: 92.1 mL/min (by C-G formula based on SCr of 0.96 mg/dL). Liver Function Tests:  Recent Labs Lab 05/02/16 1445  AST 21  ALT 14*  ALKPHOS 62  BILITOT 1.0  PROT 7.2  ALBUMIN 4.1   No results for input(s): LIPASE, AMYLASE in the last 168 hours. No results for input(s): AMMONIA in the last 168 hours. Coagulation Profile:  Recent Labs Lab 05/02/16 1445  INR 1.15   Cardiac Enzymes:  Recent Labs Lab 05/02/16 1445  TROPONINI <0.03   BNP (last 3 results) No results for input(s): PROBNP in the last 8760 hours. HbA1C: No results for input(s): HGBA1C in the last 72 hours. CBG:  Recent Labs Lab 05/04/16 1159 05/04/16 1628 05/04/16 2035 05/05/16 0747 05/05/16 1108  GLUCAP 323* 171* 215* 179* 296*   Lipid Profile:  Recent Labs  05/03/16 0545  CHOL 110  HDL 34*  LDLCALC 60  TRIG 81  CHOLHDL 3.2   Thyroid Function Tests:  Recent Labs  05/02/16 1445  TSH 0.720   Anemia Panel: No results for input(s): VITAMINB12, FOLATE, FERRITIN, TIBC, IRON, RETICCTPCT in the last 72 hours. Sepsis Labs:  Recent Labs Lab 05/04/16 2110 05/04/16 2331  LATICACIDVEN 1.4 1.0    Recent Results (from the past 240 hour(s))  Culture, blood (routine x 2)     Status: None (Preliminary result)   Collection Time: 05/04/16  9:10 PM  Result Value Ref Range Status   Specimen Description BLOOD RIGHT FOREARM  Final   Special Requests BOTTLES DRAWN AEROBIC AND ANAEROBIC 6CC  Final   Culture NO GROWTH < 12 HOURS  Final   Report Status PENDING  Incomplete  Culture, blood (routine x 2)     Status: None (Preliminary result)   Collection Time: 05/04/16  9:10 PM  Result Value Ref Range Status    Specimen Description BLOOD RIGHT HAND  Final   Special Requests BOTTLES DRAWN AEROBIC AND ANAEROBIC 6CC  Final   Culture NO GROWTH < 12 HOURS  Final   Report Status PENDING  Incomplete         Radiology Studies: Mr Brain 52 Contrast  Result Date: 05/04/2016 CLINICAL DATA:  56 year old male with altered mental status, weakness, dizziness. Initial encounter. EXAM: MRI HEAD WITHOUT CONTRAST TECHNIQUE: Multiplanar, multiecho pulse sequences of the brain and surrounding structures were obtained without intravenous contrast. COMPARISON:  CTA head and neck 05/02/2016.  Brain MRI 02/28/2015. FINDINGS: Brain: 5 mm focus of confluent restricted diffusion along the dorsal left medulla (series 4, image 58). Associated T2 and FLAIR hyperintensity with no hemorrhage or mass effect. No other restricted diffusion. Chronic small infarcts in both cerebellar  hemispheres, including the right SCA territory. No chronic ischemia evident in the brainstem. Chronic lacune of the left basal ganglia. Small area of chronic cortical encephalomalacia in the anterior right frontal operculum with mild white matter gliosis. Outside of the acute finding gray and white matter signal is stable. No chronic cerebral blood products. No midline shift, mass effect, evidence of mass lesion, ventriculomegaly, extra-axial collection or acute intracranial hemorrhage. Cervicomedullary junction and pituitary are within normal limits. Vascular: Major intracranial vascular flow voids are stable with generalized intracranial artery dolichoectasia. Skull and upper cervical spine: Negative. Sinuses/Orbits: Stable and negative orbits soft tissues. Visualized paranasal sinuses and mastoids are stable and well pneumatized. , mild ethmoid mucosal thickening. Other: Visible internal auditory structures appear normal. Negative scalp soft tissues. IMPRESSION: 1. Small acute lacunar infarct in the left dorsal medulla. No associated hemorrhage or mass effect. 2.  Otherwise stable chronic small and medium-sized vessel ischemia with both anterior and posterior circulation involvement since 2016. Electronically Signed   By: Genevie Ann M.D.   On: 05/04/2016 10:41   Dg Chest Port 1 View  Result Date: 05/05/2016 CLINICAL DATA:  Fever EXAM: PORTABLE CHEST 1 VIEW COMPARISON:  05/02/2016 FINDINGS: Heart and mediastinal contours are within normal limits. No focal opacities or effusions. No acute bony abnormality. Loop recorder device projected over the left chest. IMPRESSION: No active disease. Electronically Signed   By: Rolm Baptise M.D.   On: 05/05/2016 10:16        Scheduled Meds: . aspirin  300 mg Rectal Daily   Or  . aspirin  325 mg Oral Daily  . atorvastatin  40 mg Oral Daily  . feeding supplement (GLUCERNA SHAKE)  237 mL Oral TID BM  . folic acid  1 mg Oral Daily  . insulin aspart  0-15 Units Subcutaneous TID WC  . insulin aspart  0-5 Units Subcutaneous QHS  . pregabalin  100 mg Oral TID  . topiramate  50 mg Oral BID  . valACYclovir  1,000 mg Oral Daily   Continuous Infusions: . sodium chloride 50 mL/hr at 05/05/16 0823     LOS: 1 day    Dron Tanna Furry, MD [facility] Pager [phone]  If 7PM-7AM, please contact night-coverage [email or web address] Password [email or web address] 05/05/2016, 2:24 PM

## 2016-05-05 NOTE — Progress Notes (Signed)
Inpatient Diabetes Program Recommendations  AACE/ADA: New Consensus Statement on Inpatient Glycemic Control (2015)  Target Ranges:  Prepandial:   less than 140 mg/dL      Peak postprandial:   less than 180 mg/dL (1-2 hours)      Critically ill patients:  140 - 180 mg/dL   Results for SHAWNDALE, KENNER (MRN [id number]) as of 05/05/2016 09:32  Ref. Range 05/04/2016 07:21 05/04/2016 11:59 05/04/2016 16:28 05/04/2016 20:35 05/05/2016 07:47  Glucose-Capillary Latest Ref Range: 65 - 99 mg/dL 160 (H) 323 (H) 171 (H) 215 (H) 179 (H)   Review of Glycemic Control  Diabetes history: DM2 Outpatient Diabetes medications: Metformin 500 mg BID Current orders for Inpatient glycemic control: Novolog 0-15 units TID with meals, Novolog 0-5 units QHS  Inpatient Diabetes Program Recommendations: Insulin - Meal Coverage: Please consider ordering Novolog 3 units TID with meals for meal coverage if patient eats at least 50% of meals.  Thanks, Barnie Alderman, RN, MSN, CDE Diabetes Coordinator Inpatient Diabetes Program [phone] (Team Pager from 8am to 5pm)

## 2016-05-05 NOTE — Progress Notes (Addendum)
[name], MD     [email or web address]          Travis [name] is an 56 y.o. male.   ASSESSMENT/PLAN: Acute onset of dizziness and the gait ataxia due acute infarction. ON ASA; We will restart eliquis later - incase we decide to do LP - although I believe low yield.   Chronic headache with acute worsening in the last 3 days on presentation - Worsening likely due to viral illness - URI  Chronic neck and back pain which may have also gotten worse the last 3 days - Worsening likely due to viral illness - URI     Dicussed with hospitalist who will get flu itiers and CXR.          He CO bifrontal temporal HA 10.5/10; CO a lot nasal congestion; CO productive cough - green; dizziness unchanged. The patient seemed to have symptoms of possibly flu body aches and chills and constitutional symptoms which may explain his worsening over the last 3 days.     GENERAL: He is doing fairly well although he appears to be in some discomfort from his symptoms.  HEENT: Supple. Atraumatic normocephalic. He has significant nasal congestion.  ABDOMEN: soft  EXTREMITIES: No edema   BACK: Normal.  SKIN: Normal by inspection.    MENTAL STATUS: Alert and oriented. Speech - moderately dysarthric which is his baseline; language and cognition are generally intact. Judgment and insight normal.   CRANIAL NERVES: Pupils are equal, round and reactive to light and accommodation; extra ocular movements are full, there is no significant nystagmus; visual fields are full; upper and lower facial muscles are normal in strength and symmetric, there is no flattening of the nasolabial folds; tongue is midline; uvula is midline; shoulder elevation is normal.  MOTOR: Normal tone, bulk and strength; no pronator drift.  COORDINATION: Left finger to nose is normal, right finger to nose is normal, No rest tremor; no intention tremor; no postural tremor; no bradykinesia.  REFLEXES:  Deep tendon reflexes are symmetrical and normal.  SENSATION: Normal to light touch.           GNA NOTE [[[[[[[[[[[[[[[[[[[[[[[In summary, Travis [name] is a 56 y.o. male with PMH of HTN, HLD, DM and HA was admitted to [facility] for dizzy spells. 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 Other stroke work up all negative including MRA, CUS, EEG, TTE, only hemocysteine 19.1 and LDL 76. He was continued on ASA and pravastatin. CTA neck showed possible left V1 stenosis. However, he was admitted again on 02/28/15 for right SCA cerebellar infarct, had stroke work up again with TTE, TEE, hypercoagulable work up and 30 day cardiac event monitoring all negative except small PFO. He was put on dual antiplatelet and lipitor. EMG/NCS unremarkable. Continue topamax for headache prophylaxis.  Sleep study showed mild OSA and CPAP recommended. 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 Loop recorder showed afib in 02/2016. He was put on eliquis 64m bid and DAPT discontinued. ]]]]]]]]]]]]]]]]]]]]]]]]       Blood pressure 119/69, pulse 90, temperature 98.5 F (36.9 C), temperature source Oral, resp. rate 20, height _0  (1.854 m), weight 169 lb 1.5 oz (76.7 kg), SpO2 96 %.  Past Medical History:  Diagnosis Date  . A-fib [place] 02[place]   found on loop recorder  . 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  Anxiety   . Arthritis   . Blood transfusion without reported diagnosis   . Chronic headaches   . Complication of anesthesia    bleed after last shoulder-had to stay overnight  . Depression   . Diabetes mellitus   . Diabetic peripheral neuropathy ([place]) 03/28/2013  . Diverticulosis   . Dizziness   . Hernia, inguinal   . Hyperlipidemia   . Hypertension   . Impingement syndrome of left shoulder 08/02/2014  . 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 Neuropathy of lower extremity     . Night sweats    every once in a while  . Sleep apnea    no CPAP  . Stroke ([place]) 02/2015  . Syncope and collapse     Past Surgical History:  Procedure Laterality Date  . CERVICAL DISC SURGERY    . COLON SURGERY  2003   1/3 removed for diverticulitis  . COLONOSCOPY    . EP IMPLANTABLE DEVICE N/A 05/01/2015   Procedure: Loop Recorder Insertion;  Surgeon: [name], MD;  Location: [facility];  Service: Cardiovascular;  Laterality: N/A;  . INGUINAL HERNIA REPAIR Bilateral   . KNEE ARTHROSCOPY Right   . SHOULDER ARTHROSCOPY Right    1  . SHOULDER ARTHROSCOPY Left 08/02/2014   Procedure: LEFT SHOULDER SCOPE DEBRIDEMENT/ACROMIOPLASTY;  Surgeon: [name], MD;  Location: [facility];  Service: Orthopedics;  Laterality: Left;  ANESTHESIA: GENERAL, PRE/POST OP SCALENE  . TEE WITHOUT CARDIOVERSION N/A 03/03/2015   Procedure: TRANSESOPHAGEAL ECHOCARDIOGRAM (TEE);  Surgeon: [name], MD;  Location: [facility];  Service: Cardiology;  Laterality: N/A;  . UMBILICAL HERNIA REPAIR     with other hernia repair with mesh  . WRIST SURGERY Right    fusion    Family History  Problem Relation Age of Onset  . Stroke Father   . Hyperlipidemia Father   . Heart attack Sister 64  . Dementia Mother   . Diabetes Maternal Grandfather   . ALS Brother 56  . Colon cancer Neg Hx   . Esophageal cancer Neg Hx   . Stomach cancer Neg Hx   . Rectal cancer Neg Hx     Social History:  reports that he quit smoking about 38 years ago. His smoking use included Cigarettes. He started smoking about 45 years ago. He has a 8.00 pack-year smoking history. He quit smokeless tobacco use about 38 years ago. His smokeless tobacco use included Chew. He reports that he does not drink alcohol or use drugs.  Allergies: No Known Allergies  Medications: Prior to Admission medications   Medication Sig Start Date End Date Taking? Authorizing Provider  apixaban (ELIQUIS) 5 MG TABS  tablet Take 1 tablet (5 mg total) by mouth 2 (two) times daily. 04/22/16  Yes Sherran Needs, NP  atorvastatin (LIPITOR) 20 MG tablet TAKE 1 TABLET BY MOUTH ONCE DAILY 03/05/16  Yes Kathyrn Drown, MD  Cholecalciferol (VITAMIN D) 400 UNITS capsule Take 400 Units by mouth daily.     Yes Historical Provider, MD  Cyanocobalamin (VITAMIN B12 PO) Take by mouth daily.     Yes Historical Provider, MD  folic acid (FOLVITE) 1 MG tablet TAKE 1 TABLET BY MOUTH DAILY. 03/18/16  Yes Kathyrn Drown, MD  lisinopril (PRINIVIL,ZESTRIL) 2.5 MG tablet Take 1 tablet (2.5 mg total) by mouth every morning. 02/25/16  Yes Mikey Kirschner, MD  metFORMIN (GLUCOPHAGE) 500 MG tablet TAKE 1 TABLETS BY MOUTH 2 TIMES DAILY WITH A MEAL.  02/26/16  Yes Kathyrn Drown, MD  pregabalin (LYRICA) 100 MG capsule Take 1 capsule (100 mg total) by mouth 3 (three) times daily. 02/13/16  Yes Mikey Kirschner, MD  topiramate (TOPAMAX) 25 MG tablet TAKE 2 TABLETS BY MOUTH TWICE DAILY 09/22/15  Yes Rosalin Hawking, MD  traMADol (ULTRAM) 50 MG tablet Take 1 tablet (50 mg total) by mouth 3 (three) times daily as needed for moderate pain. 02/25/16  Yes Mikey Kirschner, MD  valACYclovir (VALTREX) 1000 MG tablet TAKE 1 TABLET BY MOUTH DAILY. 01/16/16  Yes Kathyrn Drown, MD    Scheduled Meds: . aspirin  300 mg Rectal Daily   Or  . aspirin  325 mg Oral Daily  . atorvastatin  40 mg Oral Daily  . feeding supplement (GLUCERNA SHAKE)  237 mL Oral TID BM  . folic acid  1 mg Oral Daily  . insulin aspart  0-15 Units Subcutaneous TID WC  . insulin aspart  0-5 Units Subcutaneous QHS  . pregabalin  100 mg Oral TID  . topiramate  50 mg Oral BID  . valACYclovir  1,000 mg Oral Daily   Continuous Infusions: . sodium chloride 50 mL/hr at 05/05/16 0823   PRN Meds:.acetaminophen **OR** acetaminophen (TYLENOL) oral liquid 160 mg/5 mL **OR** acetaminophen, HYDROcodone-acetaminophen, senna-docusate, traMADol     Results for orders placed or performed during  the hospital encounter of 05/02/16 (from the past 48 hour(s))  Glucose, capillary     Status: Abnormal   Collection Time: 05/03/16  4:58 PM  Result Value Ref Range   Glucose-Capillary 193 (H) 65 - 99 mg/dL  Glucose, capillary     Status: Abnormal   Collection Time: 05/03/16  9:13 PM  Result Value Ref Range   Glucose-Capillary 137 (H) 65 - 99 mg/dL   Comment 1 Notify RN    Comment 2 Document in Chart   CBC     Status: None   Collection Time: 05/04/16  5:07 AM  Result Value Ref Range   WBC 6.5 4.0 - 10.5 K/uL   RBC 4.47 4.22 - 5.81 MIL/uL   Hemoglobin 15.0 13.0 - 17.0 g/dL   HCT 43.0 39.0 - 52.0 %   MCV 96.2 78.0 - 100.0 fL   MCH 33.6 26.0 - 34.0 pg   MCHC 34.9 30.0 - 36.0 g/dL   RDW 12.3 11.5 - 15.5 %   Platelets 158 150 - 400 K/uL  Basic metabolic panel     Status: Abnormal   Collection Time: 05/04/16  5:07 AM  Result Value Ref Range   Sodium 136 135 - 145 mmol/L   Potassium 3.9 3.5 - 5.1 mmol/L   Chloride 106 101 - 111 mmol/L   CO2 23 22 - 32 mmol/L   Glucose, Bld 171 (H) 65 - 99 mg/dL   BUN 15 6 - 20 mg/dL   Creatinine, Ser 0.96 0.61 - 1.24 mg/dL   Calcium 8.8 (L) 8.9 - 10.3 mg/dL   GFR calc non Af Amer >60 >60 mL/min   GFR calc Af Amer >60 >60 mL/min    Comment: (NOTE) The eGFR has been calculated using the CKD EPI equation. This calculation has not been validated in all clinical situations. eGFR's persistently <60 mL/min signify possible Chronic Kidney Disease.    Anion gap 7 5 - 15  Glucose, capillary     Status: Abnormal   Collection Time: 05/04/16  7:21 AM  Result Value Ref Range   Glucose-Capillary 160 (H) 65 - 99 mg/dL  Comment 1 Notify RN    Comment 2 Document in Chart   Glucose, capillary     Status: Abnormal   Collection Time: 05/04/16 11:59 AM  Result Value Ref Range   Glucose-Capillary 323 (H) 65 - 99 mg/dL   Comment 1 Notify RN    Comment 2 Document in Chart   Glucose, capillary     Status: Abnormal   Collection Time: 05/04/16  4:28 PM  Result  Value Ref Range   Glucose-Capillary 171 (H) 65 - 99 mg/dL   Comment 1 Notify RN    Comment 2 Document in Chart   Glucose, capillary     Status: Abnormal   Collection Time: 05/04/16  8:35 PM  Result Value Ref Range   Glucose-Capillary 215 (H) 65 - 99 mg/dL   Comment 1 Notify RN    Comment 2 Document in Chart   Influenza panel by PCR (type A & B, H1N1)     Status: None   Collection Time: 05/04/16  8:43 PM  Result Value Ref Range   Influenza A By PCR NEGATIVE NEGATIVE   Influenza B By PCR NEGATIVE NEGATIVE    Comment: (NOTE) The Xpert Xpress Flu assay is intended as an aid in the diagnosis of  influenza and should not be used as a sole basis for treatment.  This  assay is FDA approved for nasopharyngeal swab specimens only. Nasal  washings and aspirates are unacceptable for Xpert Xpress Flu testing.   Culture, blood (routine x 2)     Status: None (Preliminary result)   Collection Time: 05/04/16  9:10 PM  Result Value Ref Range   Specimen Description BLOOD RIGHT FOREARM    Special Requests BOTTLES DRAWN AEROBIC AND ANAEROBIC 6CC    Culture NO GROWTH < 12 HOURS    Report Status PENDING   Culture, blood (routine x 2)     Status: None (Preliminary result)   Collection Time: 05/04/16  9:10 PM  Result Value Ref Range   Specimen Description BLOOD RIGHT HAND    Special Requests BOTTLES DRAWN AEROBIC AND ANAEROBIC 6CC    Culture NO GROWTH < 12 HOURS    Report Status PENDING   Lactic acid, plasma     Status: None   Collection Time: 05/04/16  9:10 PM  Result Value Ref Range   Lactic Acid, Venous 1.4 0.5 - 1.9 mmol/L  Lactic acid, plasma     Status: None   Collection Time: 05/04/16 11:31 PM  Result Value Ref Range   Lactic Acid, Venous 1.0 0.5 - 1.9 mmol/L  Glucose, capillary     Status: Abnormal   Collection Time: 05/05/16  7:47 AM  Result Value Ref Range   Glucose-Capillary 179 (H) 65 - 99 mg/dL   Comment 1 Notify RN    Comment 2 Document in Chart   Glucose, capillary     Status:  Abnormal   Collection Time: 05/05/16 11:08 AM  Result Value Ref Range   Glucose-Capillary 296 (H) 65 - 99 mg/dL   Comment 1 Notify RN    Comment 2 Document in Chart     Studies/Results:    BRAIN MRI FINDINGS: Brain: 5 mm focus of confluent restricted diffusion along the dorsal left medulla (series 4, image 58). Associated T2 and FLAIR hyperintensity with no hemorrhage or mass effect.  No other restricted diffusion. Chronic small infarcts in both cerebellar hemispheres, including the right SCA territory. No chronic ischemia evident in the brainstem. Chronic lacune of the left basal  ganglia. Small area of chronic cortical encephalomalacia in the anterior right frontal operculum with mild white matter gliosis. Outside of the acute finding gray and white matter signal is stable. No chronic cerebral blood products.  No midline shift, mass effect, evidence of mass lesion, ventriculomegaly, extra-axial collection or acute intracranial hemorrhage. Cervicomedullary junction and pituitary are within normal limits.  Vascular: Major intracranial vascular flow voids are stable with generalized intracranial artery dolichoectasia.  Skull and upper cervical spine: Negative.  Sinuses/Orbits: Stable and negative orbits soft tissues. Visualized paranasal sinuses and mastoids are stable and well pneumatized. , mild ethmoid mucosal thickening.  Other: Visible internal auditory structures appear normal. Negative scalp soft tissues.  IMPRESSION: 1. Small acute lacunar infarct in the left dorsal medulla. No associated hemorrhage or mass effect. 2. Otherwise stable chronic small and medium-sized vessel ischemia with both anterior and posterior circulation involvement since 2016.       CAROTID DOPPLERS Normal carotid Doppler ultrasound.      HEAD NECK CTA FINDINGS: CTA NECK FINDINGS  Aortic arch: Normal  Right carotid system: Common carotid artery widely patent to  the bifurcation region. Carotid bifurcation is normal without visible atherosclerosis. Cervical ICA is normal.  Left carotid system: Common carotid artery widely patent to the bifurcation region. Carotid bifurcation is normal without atherosclerosis. Cervical internal carotid artery is normal.  Vertebral arteries: Right vertebral artery dominant. Both vertebral artery origins widely patent. Both vertebral arteries widely patent through the cervical region.  Skeleton: Ordinary cervical spondylosis.  Previous ACDF C5-6.  Other neck: No soft tissue lesion.  Upper chest: Normal  Review of the MIP images confirms the above findings  CTA HEAD FINDINGS  Anterior circulation: Both internal carotid arteries are widely patent through the siphon region. The anterior and middle cerebral arteries are patent bilaterally, but there is chronic stenosis of the posterior left M2 branch.  Posterior circulation: Both vertebral arteries are widely patent at the foramen magnum level. There is atherosclerosis of the distal right vertebral artery with mild ectasia. No stenosis. No focal left vertebral finding. Basilar artery is widely patent. Posterior circulation branch vessels are normal.  Venous sinuses: Patent and normal  Anatomic variants: None significant  Delayed phase: No abnormal enhancement  Review of the MIP images confirms the above findings  IMPRESSION: No atherosclerotic disease in the upper chest or the neck.  Mild narrowing of the posterior left M2 branch as seen previously. No evidence of disease progression. No evidence of acute embolic occlusion.  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         HEAD CTA FINDINGS: Brain: Old infarcts in the right cerebellar hemisphere. Old lacunar infarct in the left basal ganglia. No acute intracranial abnormality. Specifically, no hemorrhage,  hydrocephalus, mass lesion, acute infarction, or significant intracranial injury.  Vascular: No hyperdense vessel or unexpected calcification.  Skull: No acute calvarial abnormality.  Sinuses/Orbits: Visualized paranasal sinuses and mastoids clear. Orbital soft tissues unremarkable.  Other: None  IMPRESSION: Old right cerebellar infarcts and lacunar infarct in the left basal ganglia.  No acute intracranial abnormality.             [place] Wherry A. Merlene Laughter, M.D.  Diplomate, Tax adviser of Psychiatry and Neurology ( Neurology). 05/05/2016, 2:05 PM

## 2016-05-05 NOTE — Progress Notes (Signed)
Physical Therapy Treatment Patient Details Name: Travis [name] MRN: [id number] DOB: [birth year] Today's Date: 05/05/2016    History of Present Illness  Travis [name] is a 56 y.o. male with medical history significant of HTN, HLD, DM, and CVA presenting with symptoms concerning for recurrent CVA.  Patient has been dizzy, weak on his left side, swaying to the left and bouncing off things" while walking.  "Basically feeling like crap" since 9pm last night.  Severe back pain.  Unable to sleep last night.  Has h/o CVA, symptoms are somewhat similar - dizziness is the same but no blurred vision this time.  It is hard for him to keep his eyes open, "I feel like I've been rode hard and put up wet."  Has had slurred speech since last stroke and still uses a cane.  Has been on Eliquis for the last 2 months due to h/o afib (seen on loop recorder).    PT Comments    Pt refuses to ambulate due to not wanting anyone to see him.  Therapist attempted to convince but pt  Started to be irritated  Pt ambulating with aide to bathroom and back.   Follow Up Recommendations  Outpatient PT     Equipment Recommendations  None recommended by PT    Recommendations for Other Services  none     Precautions / Restrictions Precautions Precautions: None Restrictions Weight Bearing Restrictions: No    Mobility  Bed Mobility Overal bed mobility: Independent                Transfers Overall transfer level: Independent                  Ambulation/Gait Ambulation/Gait assistance: Modified independent (Device/Increase time)   Assistive device: Straight cane       General Gait Details: Pt  had just ambulated to the restroom with aide.  Pt refuses to ambualte due to not wanting anyone to see him   Stairs            Wheelchair Mobility    Modified Rankin (Stroke Patients Only)       Balance                                    Cognition Arousal/Alertness:  Awake/alert Behavior During Therapy: WFL for tasks assessed/performed Overall Cognitive Status: Within Functional Limits for tasks assessed                      Exercises General Exercises - Lower Extremity Ankle Circles/Pumps: Both;10 reps Straight Leg Raises: Sidelying;Both;10 reps Hip Flexion/Marching: Both;10 reps;Strengthening;Supine Mini-Sqauts:  (bridging x 15)    General Comments        Pertinent Vitals/Pain Pain Assessment: 0-10 Pain Score: 10-Worst pain ever Pain Location: head Pain Descriptors / Indicators: Aching Pain Intervention(s): Limited activity within patient's tolerance    Home Living                      Prior Function            PT Goals (current goals can now be found in the care plan section) Acute Rehab PT Goals Patient Stated Goal: to go home  PT Goal Formulation: With patient Time For Goal Achievement: 05/17/16 Potential to Achieve Goals: Good Progress towards PT goals: Not progressing toward goals - comment (Pt states that he does not want  to ambulate in the hall due to not wanting anyone to see him but he is ambulating in the room,)    Frequency    Min 2X/week      PT Plan      Co-evaluation             End of Session   Activity Tolerance: Patient tolerated treatment well Patient left: in bed;with call bell/phone within reach;with bed alarm set     Time: [id number] PT Time Calculation (min) (ACUTE ONLY): 21 min  Charges:  $Therapeutic Exercise: 8-22 mins                    G CodesRayetta Humphrey, PT CLT [phone] 05/05/2016, 9:41 AM

## 2016-05-05 NOTE — Progress Notes (Signed)
Travis A. Merlene Laughter, MD     [email or web address]          Travis [name] is an 56 y.o. male.   ASSESSMENT/PLAN: Unexplained acute onset of dizziness and the gait ataxia. The patient does have a history of infarcts in the past and history of atrial fibrillation. I suspect this is most likely situation. No clear metabolic derangements have been uncovered. Medication effect is also possibility but nothing really stands out.  Chronic headache with acute worsening in the last 3 days on presentation.  Chronic neck and back pain which may have also gotten worse the last 3 days.  The patient seemed to have symptoms of possibly flu body aches and chills and constitutional symptoms which may explain his worsening over the last 3 days.  The patient has a brain MRI which is pending. We will follow up this results in follow-up his overall clinical picture. Discussions were made about the possibility of doing a spinal tap if the imaging is unrevealing. A lot of his symptoms are chronic so it is unlikely that the patient has acute bacterial meningitis.   Also consider checking for flu.          GENERAL: He is doing fairly well although he appears to be in some discomfort from his symptoms.  HEENT: Supple. Atraumatic normocephalic. He has significant nasal congestion.  ABDOMEN: soft  EXTREMITIES: No edema   BACK: Normal.  SKIN: Normal by inspection.    MENTAL STATUS: Alert and oriented. Speech - moderately dysarthric which is his baseline; language and cognition are generally intact. Judgment and insight normal.   CRANIAL NERVES: Pupils are equal, round and reactive to light and accommodation; extra ocular movements are full, there is no significant nystagmus; visual fields are full; upper and lower facial muscles are normal in strength and symmetric, there is no flattening of the nasolabial folds; tongue is midline; uvula is midline; shoulder elevation is  normal.  MOTOR: Normal tone, bulk and strength; no pronator drift.  COORDINATION: Left finger to nose is normal, right finger to nose is normal, No rest tremor; no intention tremor; no postural tremor; no bradykinesia.  REFLEXES: Deep tendon reflexes are symmetrical and normal.  SENSATION: Normal to light touch.           GNA NOTE [[[[[[[[[[[[[[[[[[[[[[[In summary, Travis [name] is a 56 y.o. male with PMH of HTN, HLD, DM and HA was admitted to [facility] for dizzy spells. 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 Other stroke work up all negative including MRA, CUS, EEG, TTE, only hemocysteine 19.1 and LDL 76. He was continued on ASA and pravastatin. CTA neck showed possible left V1 stenosis. However, he was admitted again on 02/28/15 for right SCA cerebellar infarct, had stroke work up again with TTE, TEE, hypercoagulable work up and 30 day cardiac event monitoring all negative except small PFO. He was put on dual antiplatelet and lipitor. EMG/NCS unremarkable. Continue topamax for headache prophylaxis.  Sleep study showed mild OSA and CPAP recommended. 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 Loop recorder showed afib in 02/2016. He was put on eliquis 50m bid and DAPT discontinued. ]]]]]]]]]]]]]]]]]]]]]]]]       Blood pressure 105/68, pulse 86, temperature 98.1 F (36.7 C), temperature source Oral, resp. rate 18, height _0  (1.854 m), weight 169 lb 1.5 oz (76.7 kg), SpO2 97 %.  Past Medical History:  Diagnosis Date  . A-fib ([place]) 02/2016   found on loop recorder  . 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 Anxiety   . Arthritis   . Blood transfusion without reported diagnosis   . Chronic headaches   . Complication of anesthesia    bleed after last shoulder-had to stay overnight  . Depression   . Diabetes mellitus   . Diabetic peripheral  neuropathy ([place]) 03/28/2013  . Diverticulosis   . Dizziness   . Hernia, inguinal   . Hyperlipidemia   . Hypertension   . Impingement syndrome of left shoulder 08/02/2014  . 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 Neuropathy of lower extremity   . Night sweats    every once in a while  . Sleep apnea    no CPAP  . Stroke ([place]) 02/2015  . Syncope and collapse     Past Surgical History:  Procedure Laterality Date  . CERVICAL DISC SURGERY    . COLON SURGERY  2003   1/3 removed for diverticulitis  . COLONOSCOPY    . EP IMPLANTABLE DEVICE N/A 05/01/2015   Procedure: Loop Recorder Insertion;  Surgeon: [name], MD;  Location: [facility];  Service: Cardiovascular;  Laterality: N/A;  . INGUINAL HERNIA REPAIR Bilateral   . KNEE ARTHROSCOPY Right   . SHOULDER ARTHROSCOPY Right    1  . SHOULDER ARTHROSCOPY Left 08/02/2014   Procedure: LEFT SHOULDER SCOPE DEBRIDEMENT/ACROMIOPLASTY;  Surgeon: [name], MD;  Location: [facility];  Service: Orthopedics;  Laterality: Left;  ANESTHESIA: GENERAL, PRE/POST OP SCALENE  . TEE WITHOUT CARDIOVERSION N/A 03/03/2015   Procedure: TRANSESOPHAGEAL ECHOCARDIOGRAM (TEE);  Surgeon: [name], MD;  Location[facility];  Service: Cardiology;  Laterality: N/A;  . UMBILICAL HERNIA REPAIR     with other hernia repair with mesh  . WRIST SURGERY Right    fusion    Family History  Problem Relation Age of Onset  . Stroke Father   . Hyperlipidemia Father   . Heart attack Sister 39  . Dementia Mother   . Diabetes Maternal Grandfather   . ALS Brother 59  . Colon cancer Neg Hx   . Esophageal cancer Neg Hx   . Stomach cancer Neg Hx   . Rectal cancer Neg Hx     Social History:  reports that he quit smoking about 38 years ago. His smoking use included Cigarettes. He started smoking about 45 years ago. He has a 8.00 pack-year smoking history. He quit smokeless tobacco use about 38 years ago. His smokeless tobacco use included  Chew. He reports that he does not drink alcohol or use drugs.  Allergies: No Known Allergies  Medications: Prior to Admission medications   Medication Sig Start Date End Date Taking? Authorizing Provider  apixaban (ELIQUIS) 5 MG TABS tablet Take 1 tablet (5 mg total) by mouth 2 (two) times daily. 04/22/16  Yes Sherran Needs, NP  atorvastatin (LIPITOR) 20 MG tablet TAKE 1 TABLET BY MOUTH ONCE DAILY 03/05/16  Yes Kathyrn Drown, MD  Cholecalciferol (VITAMIN D) 400 UNITS capsule Take 400 Units by mouth daily.     Yes Historical Provider, MD  Cyanocobalamin (VITAMIN B12 PO) Take by mouth daily.     Yes Historical Provider, MD  folic acid (FOLVITE) 1 MG tablet TAKE 1 TABLET BY MOUTH DAILY. 03/18/16  Yes Kathyrn Drown, MD  lisinopril (PRINIVIL,ZESTRIL) 2.5 MG tablet Take 1 tablet (2.5 mg total) by mouth  every morning. 02/25/16  Yes Mikey Kirschner, MD  metFORMIN (GLUCOPHAGE) 500 MG tablet TAKE 1 TABLETS BY MOUTH 2 TIMES DAILY WITH A MEAL. 02/26/16  Yes Kathyrn Drown, MD  pregabalin (LYRICA) 100 MG capsule Take 1 capsule (100 mg total) by mouth 3 (three) times daily. 02/13/16  Yes Mikey Kirschner, MD  topiramate (TOPAMAX) 25 MG tablet TAKE 2 TABLETS BY MOUTH TWICE DAILY 09/22/15  Yes Rosalin Hawking, MD  traMADol (ULTRAM) 50 MG tablet Take 1 tablet (50 mg total) by mouth 3 (three) times daily as needed for moderate pain. 02/25/16  Yes Mikey Kirschner, MD  valACYclovir (VALTREX) 1000 MG tablet TAKE 1 TABLET BY MOUTH DAILY. 01/16/16  Yes Kathyrn Drown, MD    Scheduled Meds: . aspirin  300 mg Rectal Daily   Or  . aspirin  325 mg Oral Daily  . atorvastatin  40 mg Oral Daily  . feeding supplement (GLUCERNA SHAKE)  237 mL Oral TID BM  . folic acid  1 mg Oral Daily  . insulin aspart  0-15 Units Subcutaneous TID WC  . insulin aspart  0-5 Units Subcutaneous QHS  . pregabalin  100 mg Oral TID  . topiramate  50 mg Oral BID  . valACYclovir  1,000 mg Oral Daily   Continuous Infusions: . sodium chloride  50 mL/hr at 05/05/16 0823   PRN Meds:.acetaminophen **OR** acetaminophen (TYLENOL) oral liquid 160 mg/5 mL **OR** acetaminophen, HYDROcodone-acetaminophen, senna-docusate, traMADol     Results for orders placed or performed during the hospital encounter of 05/02/16 (from the past 48 hour(s))  Urinalysis, Routine w reflex microscopic     Status: Abnormal   Collection Time: 05/03/16 10:05 AM  Result Value Ref Range   Color, Urine YELLOW YELLOW   APPearance CLOUDY (A) CLEAR   Specific Gravity, Urine 1.015 1.005 - 1.030   pH 8.0 5.0 - 8.0   Glucose, UA NEGATIVE NEGATIVE mg/dL   Hgb urine dipstick NEGATIVE NEGATIVE   Bilirubin Urine NEGATIVE NEGATIVE   Ketones, ur NEGATIVE NEGATIVE mg/dL   Protein, ur TRACE (A) NEGATIVE mg/dL   Nitrite NEGATIVE NEGATIVE   Leukocytes, UA NEGATIVE NEGATIVE  Urinalysis, Microscopic (reflex)     Status: Abnormal   Collection Time: 05/03/16 10:05 AM  Result Value Ref Range   RBC / HPF 0-5 0 - 5 RBC/hpf   WBC, UA NONE SEEN 0 - 5 WBC/hpf   Bacteria, UA MANY (A) NONE SEEN   Squamous Epithelial / LPF NONE SEEN NONE SEEN  Glucose, capillary     Status: Abnormal   Collection Time: 05/03/16 11:39 AM  Result Value Ref Range   Glucose-Capillary 165 (H) 65 - 99 mg/dL   Comment 1 Notify RN    Comment 2 Document in Chart   Glucose, capillary     Status: Abnormal   Collection Time: 05/03/16  4:58 PM  Result Value Ref Range   Glucose-Capillary 193 (H) 65 - 99 mg/dL  Glucose, capillary     Status: Abnormal   Collection Time: 05/03/16  9:13 PM  Result Value Ref Range   Glucose-Capillary 137 (H) 65 - 99 mg/dL   Comment 1 Notify RN    Comment 2 Document in Chart   CBC     Status: None   Collection Time: 05/04/16  5:07 AM  Result Value Ref Range   WBC 6.5 4.0 - 10.5 K/uL   RBC 4.47 4.22 - 5.81 MIL/uL   Hemoglobin 15.0 13.0 - 17.0 g/dL   HCT  43.0 39.0 - 52.0 %   MCV 96.2 78.0 - 100.0 fL   MCH 33.6 26.0 - 34.0 pg   MCHC 34.9 30.0 - 36.0 g/dL   RDW 12.3 11.5 -  15.5 %   Platelets 158 150 - 400 K/uL  Basic metabolic panel     Status: Abnormal   Collection Time: 05/04/16  5:07 AM  Result Value Ref Range   Sodium 136 135 - 145 mmol/L   Potassium 3.9 3.5 - 5.1 mmol/L   Chloride 106 101 - 111 mmol/L   CO2 23 22 - 32 mmol/L   Glucose, Bld 171 (H) 65 - 99 mg/dL   BUN 15 6 - 20 mg/dL   Creatinine, Ser 0.96 0.61 - 1.24 mg/dL   Calcium 8.8 (L) 8.9 - 10.3 mg/dL   GFR calc non Af Amer >60 >60 mL/min   GFR calc Af Amer >60 >60 mL/min    Comment: (NOTE) The eGFR has been calculated using the CKD EPI equation. This calculation has not been validated in all clinical situations. eGFR's persistently <60 mL/min signify possible Chronic Kidney Disease.    Anion gap 7 5 - 15  Glucose, capillary     Status: Abnormal   Collection Time: 05/04/16  7:21 AM  Result Value Ref Range   Glucose-Capillary 160 (H) 65 - 99 mg/dL   Comment 1 Notify RN    Comment 2 Document in Chart   Glucose, capillary     Status: Abnormal   Collection Time: 05/04/16 11:59 AM  Result Value Ref Range   Glucose-Capillary 323 (H) 65 - 99 mg/dL   Comment 1 Notify RN    Comment 2 Document in Chart   Glucose, capillary     Status: Abnormal   Collection Time: 05/04/16  4:28 PM  Result Value Ref Range   Glucose-Capillary 171 (H) 65 - 99 mg/dL   Comment 1 Notify RN    Comment 2 Document in Chart   Glucose, capillary     Status: Abnormal   Collection Time: 05/04/16  8:35 PM  Result Value Ref Range   Glucose-Capillary 215 (H) 65 - 99 mg/dL   Comment 1 Notify RN    Comment 2 Document in Chart   Influenza panel by PCR (type A & B, H1N1)     Status: None   Collection Time: 05/04/16  8:43 PM  Result Value Ref Range   Influenza A By PCR NEGATIVE NEGATIVE   Influenza B By PCR NEGATIVE NEGATIVE    Comment: (NOTE) The Xpert Xpress Flu assay is intended as an aid in the diagnosis of  influenza and should not be used as a sole basis for treatment.  This  assay is FDA approved for  nasopharyngeal swab specimens only. Nasal  washings and aspirates are unacceptable for Xpert Xpress Flu testing.   Culture, blood (routine x 2)     Status: None (Preliminary result)   Collection Time: 05/04/16  9:10 PM  Result Value Ref Range   Specimen Description BLOOD RIGHT FOREARM    Special Requests BOTTLES DRAWN AEROBIC AND ANAEROBIC 6CC    Culture PENDING    Report Status PENDING   Culture, blood (routine x 2)     Status: None (Preliminary result)   Collection Time: 05/04/16  9:10 PM  Result Value Ref Range   Specimen Description BLOOD RIGHT HAND    Special Requests BOTTLES DRAWN AEROBIC AND ANAEROBIC 6CC    Culture PENDING    Report Status PENDING  Lactic acid, plasma     Status: None   Collection Time: 05/04/16  9:10 PM  Result Value Ref Range   Lactic Acid, Venous 1.4 0.5 - 1.9 mmol/L  Lactic acid, plasma     Status: None   Collection Time: 05/04/16 11:31 PM  Result Value Ref Range   Lactic Acid, Venous 1.0 0.5 - 1.9 mmol/L  Glucose, capillary     Status: Abnormal   Collection Time: 05/05/16  7:47 AM  Result Value Ref Range   Glucose-Capillary 179 (H) 65 - 99 mg/dL   Comment 1 Notify RN    Comment 2 Document in Chart     Studies/Results:    BRAIN MRI FINDINGS: Brain: 5 mm focus of confluent restricted diffusion along the dorsal left medulla (series 4, image 58). Associated T2 and FLAIR hyperintensity with no hemorrhage or mass effect.  No other restricted diffusion. Chronic small infarcts in both cerebellar hemispheres, including the right SCA territory. No chronic ischemia evident in the brainstem. Chronic lacune of the left basal ganglia. Small area of chronic cortical encephalomalacia in the anterior right frontal operculum with mild white matter gliosis. Outside of the acute finding gray and white matter signal is stable. No chronic cerebral blood products.  No midline shift, mass effect, evidence of mass lesion, ventriculomegaly, extra-axial  collection or acute intracranial hemorrhage. Cervicomedullary junction and pituitary are within normal limits.  Vascular: Major intracranial vascular flow voids are stable with generalized intracranial artery dolichoectasia.  Skull and upper cervical spine: Negative.  Sinuses/Orbits: Stable and negative orbits soft tissues. Visualized paranasal sinuses and mastoids are stable and well pneumatized. , mild ethmoid mucosal thickening.  Other: Visible internal auditory structures appear normal. Negative scalp soft tissues.  IMPRESSION: 1. Small acute lacunar infarct in the left dorsal medulla. No associated hemorrhage or mass effect. 2. Otherwise stable chronic small and medium-sized vessel ischemia with both anterior and posterior circulation involvement since 2016.       CAROTID DOPPLERS Normal carotid Doppler ultrasound.      HEAD NECK CTA FINDINGS: CTA NECK FINDINGS  Aortic arch: Normal  Right carotid system: Common carotid artery widely patent to the bifurcation region. Carotid bifurcation is normal without visible atherosclerosis. Cervical ICA is normal.  Left carotid system: Common carotid artery widely patent to the bifurcation region. Carotid bifurcation is normal without atherosclerosis. Cervical internal carotid artery is normal.  Vertebral arteries: Right vertebral artery dominant. Both vertebral artery origins widely patent. Both vertebral arteries widely patent through the cervical region.  Skeleton: Ordinary cervical spondylosis.  Previous ACDF C5-6.  Other neck: No soft tissue lesion.  Upper chest: Normal  Review of the MIP images confirms the above findings  CTA HEAD FINDINGS  Anterior circulation: Both internal carotid arteries are widely patent through the siphon region. The anterior and middle cerebral arteries are patent bilaterally, but there is chronic stenosis of the posterior left M2 branch.  Posterior  circulation: Both vertebral arteries are widely patent at the foramen magnum level. There is atherosclerosis of the distal right vertebral artery with mild ectasia. No stenosis. No focal left vertebral finding. Basilar artery is widely patent. Posterior circulation branch vessels are normal.  Venous sinuses: Patent and normal  Anatomic variants: None significant  Delayed phase: No abnormal enhancement  Review of the MIP images confirms the above findings  IMPRESSION: No atherosclerotic disease in the upper chest or the neck.  Mild narrowing of the posterior left M2 branch as seen previously. No evidence of disease progression. No  evidence of acute embolic occlusion.  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         HEAD CTA FINDINGS: Brain: Old infarcts in the right cerebellar hemisphere. Old lacunar infarct in the left basal ganglia. No acute intracranial abnormality. Specifically, no hemorrhage, hydrocephalus, mass lesion, acute infarction, or significant intracranial injury.  Vascular: No hyperdense vessel or unexpected calcification.  Skull: No acute calvarial abnormality.  Sinuses/Orbits: Visualized paranasal sinuses and mastoids clear. Orbital soft tissues unremarkable.  Other: None  IMPRESSION: Old right cerebellar infarcts and lacunar infarct in the left basal ganglia.  No acute intracranial abnormality.             Nusaybah Ivie A. Merlene [name], M.D.  Diplomate, Tax adviser of Psychiatry and Neurology ( Neurology). 05/05/2016, 8:35 AM

## 2016-05-05 NOTE — Progress Notes (Signed)
Went to set CPAP machine up on pt ..pt cursing about staff coming in room and refuse to wear. Nurse informed

## 2016-05-06 ENCOUNTER — Telehealth: Payer: Self-pay | Admitting: Neurology

## 2016-05-06 ENCOUNTER — Telehealth: Payer: Self-pay | Admitting: Family Medicine

## 2016-05-06 DIAGNOSIS — J210 Acute bronchiolitis due to respiratory syncytial virus: Secondary | ICD-10-CM

## 2016-05-06 LAB — GLUCOSE, CAPILLARY
Glucose-Capillary: 189 mg/dL — ABNORMAL HIGH (ref 65–99)
Glucose-Capillary: 269 mg/dL — ABNORMAL HIGH (ref 65–99)
Glucose-Capillary: 152 mg/dL — ABNORMAL HIGH (ref 65–99)

## 2016-05-06 LAB — URINE CULTURE

## 2016-05-06 MED FILL — valACYclovir HCL 1 GM TABS: 1 | 90 days supply | Qty: 90 | Fill #1

## 2016-05-06 NOTE — Telephone Encounter (Signed)
Lab work showed positive RSV. It was negative for the flu. I recommend that the patient have a follow-up office visit next week. I recommend the patient bring all medications with him in a bag. Also recommend wife, with patient.

## 2016-05-06 NOTE — Discharge Summary (Signed)
Physician Discharge Summary  Travis [name] [id number] DOB: 03/12/1961 DOA: 05/02/2016  PCP: [name], MD  Admit date: 05/02/2016 Discharge date: 05/06/2016  Admitted From:Home Disposition:home with home care  Recommendations for Outpatient Follow-up:  1. Follow up with PCP in 1-2 weeks 2. Please obtain BMP/CBC in one week 3. Please do not drive until you are reevaluated by neurologist   Home Health: Yes Equipment/Devices: No Discharge Condition: Stable CODE STATUS: Full code Diet recommendation: Carb modified heart healthy diet.  Brief/Interim Summary: 56 year old male with history of hypertension, diabetes and previous stroke. Presents to the hospital with complaints of dizziness and generalized weakness. He reports that he keeps leaning to the left while he is walking. Due to concerns of underlying CVA, he was referred for admission.  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 Discussed with the neurologist Dr Merlene Laughter, agreed with the discharge with Eliquis and outpatient follow-up. Continued on Lipitor. -Carotid Doppler normal -Echocardiogram with normal systolic function and grade 1 diastolic dysfunction. -PT, OT evaluated the patient and recommended outpatient physical therapy. Discussed with the case manager, social worker regarding discharge planning. -Patient requested to call his wife to discuss about the finding and discharge planning. I called his wife [name] and discussed at length. Recommended outpatient physical therapy and follow-up with neurologist. Verbalized understanding. She reported that dysarthria is chronic in nature.  #hypertension:  continue home medication. Recommended to monitor blood pressure at home and follow up with PCP.  #Hyperlipidemia: On statin. LDL 60  #Diabetes: A1c 5.4. Resume home oral hypoglycemic agent. Advised outpatient follow-up with  PCP.  #Atrial fibrillation, likely paroxysmal: Heart rate is controlled. On Eliquis for systemic anticoagulation..  # Acute Febrile illness due to RSV viral infection. Patient has been afebrile for more than 24 hours. He is reporting feeling better. Recommended conservative treatment. Discussed with the patient and his wife at length regarding conservative treatment and outpatient follow-up.  Patient is medically stable to discharge home with outpatient follow-up.   Discharge Diagnoses:  Principal Problem:   Dizziness Active Problems:   Diabetes type 2, controlled (HCC)   Cerebral infarction, chronic   Essential hypertension   HLD (hyperlipidemia)   OSA (obstructive sleep apnea)   Paroxysmal atrial fibrillation (HCC)   Anxiety and depression   Acute CVA (cerebrovascular accident) [facility])    Discharge Instructions  Discharge Instructions    Ambulatory referral to Physical Therapy    Complete by:  As directed    Call MD for:  difficulty breathing, headache or visual disturbances    Complete by:  As directed    Call MD for:  hives    Complete by:  As directed    Call MD for:  persistant dizziness or light-headedness    Complete by:  As directed    Call MD for:  persistant nausea and vomiting    Complete by:  As directed    Call MD for:  severe uncontrolled pain    Complete by:  As directed    Call MD for:  temperature >100.4    Complete by:  As directed    Diet - low sodium heart healthy    Complete by:  As directed    Diet Carb Modified    Complete by:  As directed    Discharge instructions    Complete by:  As directed    Please don't drive until your are re-evaluated by neurologist   Increase  activity slowly    Complete by:  As directed      Allergies as of 05/06/2016   No Known Allergies     Medication List    TAKE these medications   apixaban 5 MG Tabs tablet Commonly known as:  ELIQUIS Take 1 tablet (5 mg total) by mouth 2 (two) times daily.    atorvastatin 20 MG tablet Commonly known as:  LIPITOR TAKE 1 TABLET BY MOUTH ONCE DAILY   folic acid 1 MG tablet Commonly known as:  FOLVITE TAKE 1 TABLET BY MOUTH DAILY.   lisinopril 2.5 MG tablet Commonly known as:  PRINIVIL,ZESTRIL Take 1 tablet (2.5 mg total) by mouth every morning.   metFORMIN 500 MG tablet Commonly known as:  GLUCOPHAGE TAKE 1 TABLETS BY MOUTH 2 TIMES DAILY WITH A MEAL.   pregabalin 100 MG capsule Commonly known as:  LYRICA Take 1 capsule (100 mg total) by mouth 3 (three) times daily.   topiramate 25 MG tablet Commonly known as:  TOPAMAX TAKE 2 TABLETS BY MOUTH TWICE DAILY   traMADol 50 MG tablet Commonly known as:  ULTRAM Take 1 tablet (50 mg total) by mouth 3 (three) times daily as needed for moderate pain.   valACYclovir 1000 MG tablet Commonly known as:  VALTREX TAKE 1 TABLET BY MOUTH DAILY.   VITAMIN B12 PO Take by mouth daily.   Vitamin D 400 units capsule Take 400 Units by mouth daily.      Follow-up Information    [name], MD. Schedule an appointment as soon as possible for a visit in 1 week(s).   Specialty:  Family Medicine Contact information: [address] [place] [address] [phone]        Xu,Jindong, MD. Schedule an appointment as soon as possible for a visit in 3 week(s).   Specialty:  Neurology Contact information: [place] [address] [phone]          No Known Allergies  Consultations: Neurologist  Procedures/Studies: MRI, carotid Doppler, echocardiogram  Subjective: Patient was seen and examined at bedside. He reported feeling better. Denied headache, dizziness, nausea, vomiting, chest pain or shortness of breath. Discharge Exam: Vitals:   05/06/16 0420 05/06/16 0820  BP: 113/78 120/79  Pulse: 82 76  Resp: 18 20  Temp: 97.9 F (36.6 C) 98 F (36.7 C)   Vitals:   05/05/16 2020 05/06/16 0020 05/06/16 0420 05/06/16 0820  BP: 140/79 105/68  113/78 120/79  Pulse: 88 89 82 76  Resp: 20 16 18 20   Temp: 98.4 F (36.9 C) 98.5 F (36.9 C) 97.9 F (36.6 C) 98 F (36.7 C)  TempSrc: Oral Oral Oral Oral  SpO2: 97% 94% 97% 97%  Weight:      Height:        General: Pt is alert, awake, not in acute distress Cardiovascular: RRR, S1/S2 +, no rubs, no gallops Respiratory: CTA bilaterally, no wheezing, no rhonchi Abdominal: Soft, NT, ND, bowel sounds + Extremities: no edema, no cyanosis. Normal muscle strength in all extremities Neurologic: Alert, awake, oriented.   The results of significant diagnostics from this hospitalization (including imaging, microbiology, ancillary and laboratory) are listed below for reference.     Microbiology: Recent Results (from the past 240 hour(s))  Culture, Urine     Status: Abnormal   Collection Time: 05/04/16 10:05 AM  Result Value Ref Range Status   Specimen Description URINE, RANDOM  Final   Special Requests NONE  Final  Culture MULTIPLE SPECIES PRESENT, SUGGEST RECOLLECTION (A)  Final   Report Status 05/06/2016 FINAL  Final  Culture, blood (routine x 2)     Status: None (Preliminary result)   Collection Time: 05/04/16  9:10 PM  Result Value Ref Range Status   Specimen Description BLOOD RIGHT FOREARM  Final   Special Requests BOTTLES DRAWN AEROBIC AND ANAEROBIC 6CC  Final   Culture NO GROWTH 2 DAYS  Final   Report Status PENDING  Incomplete  Culture, blood (routine x 2)     Status: None (Preliminary result)   Collection Time: 05/04/16  9:10 PM  Result Value Ref Range Status   Specimen Description BLOOD RIGHT HAND  Final   Special Requests BOTTLES DRAWN AEROBIC AND ANAEROBIC 6CC  Final   Culture NO GROWTH 2 DAYS  Final   Report Status PENDING  Incomplete  Respiratory Panel by PCR     Status: Abnormal   Collection Time: 05/05/16  9:52 AM  Result Value Ref Range Status   Adenovirus NOT DETECTED NOT DETECTED Final   Coronavirus 229E NOT DETECTED NOT DETECTED Final   Coronavirus HKU1  NOT DETECTED NOT DETECTED Final   Coronavirus NL63 NOT DETECTED NOT DETECTED Final   Coronavirus OC43 NOT DETECTED NOT DETECTED Final   Metapneumovirus NOT DETECTED NOT DETECTED Final   Rhinovirus / Enterovirus NOT DETECTED NOT DETECTED Final   Influenza A NOT DETECTED NOT DETECTED Final   Influenza B NOT DETECTED NOT DETECTED Final   Parainfluenza Virus 1 NOT DETECTED NOT DETECTED Final   Parainfluenza Virus 2 NOT DETECTED NOT DETECTED Final   Parainfluenza Virus 3 NOT DETECTED NOT DETECTED Final   Parainfluenza Virus 4 NOT DETECTED NOT DETECTED Final   Respiratory Syncytial Virus DETECTED (A) NOT DETECTED Final    Comment: CRITICAL RESULT CALLED TO, READ BACK BY AND VERIFIED WITH: Cory Roughen RN 16:25 05/05/16 ([email or web address])    Bordetella pertussis NOT DETECTED NOT DETECTED Final   Chlamydophila pneumoniae NOT DETECTED NOT DETECTED Final   Mycoplasma pneumoniae NOT DETECTED NOT DETECTED Final    Comment: Performed at [facility]: BNP (last 3 results) No results for input(s): BNP in the last 8760 hours. Basic Metabolic Panel:  Recent Labs Lab 05/02/16 1445 05/02/16 1458 05/04/16 0507  NA 136 141 136  K 3.7 3.7 3.9  CL 106 105 106  CO2 24  --  23  GLUCOSE 289* 286* 171*  BUN 11 10 15   CREATININE 0.90 0.90 0.96  CALCIUM 9.0  --  8.8*   Liver Function Tests:  Recent Labs Lab 05/02/16 1445  AST 21  ALT 14*  ALKPHOS 62  BILITOT 1.0  PROT 7.2  ALBUMIN 4.1   No results for input(s): LIPASE, AMYLASE in the last 168 hours. No results for input(s): AMMONIA in the last 168 hours. CBC:  Recent Labs Lab 05/02/16 1445 05/02/16 1458 05/04/16 0507  WBC 7.0  --  6.5  NEUTROABS 5.3  --   --   HGB 14.7 13.9 15.0  HCT 42.0 41.0 43.0  MCV 94.8  --  96.2  PLT 169  --  158   Cardiac Enzymes:  Recent Labs Lab 05/02/16 1445  TROPONINI <0.03   BNP: Invalid input(s): POCBNP CBG:  Recent Labs Lab 05/05/16 1108 05/05/16 1633 05/05/16 2031  05/06/16 0730 05/06/16 1124  GLUCAP 296* 177* 296* 189* 269*   D-Dimer No results for input(s): DDIMER in the last 72 hours. Hgb A1c No results for input(s):  HGBA1C in the last 72 hours. Lipid Profile No results for input(s): CHOL, HDL, LDLCALC, TRIG, CHOLHDL, LDLDIRECT in the last 72 hours. Thyroid function studies No results for input(s): TSH, T4TOTAL, T3FREE, THYROIDAB in the last 72 hours.  Invalid input(s): FREET3 Anemia work up No results for input(s): VITAMINB12, FOLATE, FERRITIN, TIBC, IRON, RETICCTPCT in the last 72 hours. Urinalysis    Component Value Date/Time   COLORURINE YELLOW 05/03/2016 1005   APPEARANCEUR CLOUDY (A) 05/03/2016 1005   LABSPEC 1.015 05/03/2016 1005   PHURINE 8.0 05/03/2016 1005   GLUCOSEU NEGATIVE 05/03/2016 1005   HGBUR NEGATIVE 05/03/2016 1005   BILIRUBINUR NEGATIVE 05/03/2016 1005   KETONESUR NEGATIVE 05/03/2016 1005   PROTEINUR TRACE (A) 05/03/2016 1005   UROBILINOGEN 0.2 02/28/2015 1750   NITRITE NEGATIVE 05/03/2016 1005   LEUKOCYTESUR NEGATIVE 05/03/2016 1005   Sepsis Labs Invalid input(s): PROCALCITONIN,  WBC,  LACTICIDVEN Microbiology Recent Results (from the past 240 hour(s))  Culture, Urine     Status: Abnormal   Collection Time: 05/04/16 10:05 AM  Result Value Ref Range Status   Specimen Description URINE, RANDOM  Final   Special Requests NONE  Final   Culture MULTIPLE SPECIES PRESENT, SUGGEST RECOLLECTION (A)  Final   Report Status 05/06/2016 FINAL  Final  Culture, blood (routine x 2)     Status: None (Preliminary result)   Collection Time: 05/04/16  9:10 PM  Result Value Ref Range Status   Specimen Description BLOOD RIGHT FOREARM  Final   Special Requests BOTTLES DRAWN AEROBIC AND ANAEROBIC 6CC  Final   Culture NO GROWTH 2 DAYS  Final   Report Status PENDING  Incomplete  Culture, blood (routine x 2)     Status: None (Preliminary result)   Collection Time: 05/04/16  9:10 PM  Result Value Ref Range Status   Specimen  Description BLOOD RIGHT HAND  Final   Special Requests BOTTLES DRAWN AEROBIC AND ANAEROBIC 6CC  Final   Culture NO GROWTH 2 DAYS  Final   Report Status PENDING  Incomplete  Respiratory Panel by PCR     Status: Abnormal   Collection Time: 05/05/16  9:52 AM  Result Value Ref Range Status   Adenovirus NOT DETECTED NOT DETECTED Final   Coronavirus 229E NOT DETECTED NOT DETECTED Final   Coronavirus HKU1 NOT DETECTED NOT DETECTED Final   Coronavirus NL63 NOT DETECTED NOT DETECTED Final   Coronavirus OC43 NOT DETECTED NOT DETECTED Final   Metapneumovirus NOT DETECTED NOT DETECTED Final   Rhinovirus / Enterovirus NOT DETECTED NOT DETECTED Final   Influenza A NOT DETECTED NOT DETECTED Final   Influenza B NOT DETECTED NOT DETECTED Final   Parainfluenza Virus 1 NOT DETECTED NOT DETECTED Final   Parainfluenza Virus 2 NOT DETECTED NOT DETECTED Final   Parainfluenza Virus 3 NOT DETECTED NOT DETECTED Final   Parainfluenza Virus 4 NOT DETECTED NOT DETECTED Final   Respiratory Syncytial Virus DETECTED (A) NOT DETECTED Final    Comment: CRITICAL RESULT CALLED TO, READ BACK BY AND VERIFIED WITH: Cory Roughen RN 16:25 05/05/16 ([email or web address])    Bordetella pertussis NOT DETECTED NOT DETECTED Final   Chlamydophila pneumoniae NOT DETECTED NOT DETECTED Final   Mycoplasma pneumoniae NOT DETECTED NOT DETECTED Final    Comment: Performed at [facility]     Time coordinating discharge: Over 30 minutes  SIGNED:   Rosita Fire, MD  [facility] 05/06/2016, 12:18 PM  If 7PM-7AM, please contact night-coverage [email or web address] Password [email or web address]

## 2016-05-06 NOTE — Telephone Encounter (Signed)
Called back , message left to return call. 

## 2016-05-06 NOTE — Telephone Encounter (Signed)
Pt wife called to ask about pt's current status. States that pt has told her that he has RSV vs Flu. [name] (wife) asks that Dr. Nicki Reaper review notes and let her know which is accurate. She states that she has not seen the Hospitalist since admission and there are descrepencies regarding which medications he is taking and not taking.

## 2016-05-06 NOTE — Telephone Encounter (Signed)
Patients wife aware

## 2016-05-06 NOTE — Telephone Encounter (Signed)
appt scheduled for next week 

## 2016-05-06 NOTE — Telephone Encounter (Signed)
Patients wife [name] would like Dr. Erlinda Hong to please review patients discharge medications and call her.

## 2016-05-06 NOTE — Progress Notes (Signed)
Patient states understanding of discharge instructions.  

## 2016-05-06 NOTE — Progress Notes (Signed)
Travis A. Travis Laughter, MD     [email or web address]          Travis [name] is an 56 y.o. male.   ASSESSMENT/PLAN: Acute onset of dizziness and the gait ataxia due acute infarction.   The patient's worsening headaches and neck pain and back pain are likely due to acute viral illness last respiratory syncytial virus infection.  The patient is to restart eliquis. Follow-up is recommended in the office in 1 month.        He CO bifrontal temporal HA 9/10; CO a lot nasal congestion; CO productive cough - green; dizziness unchanged.     GENERAL: He is doing fairly well although he appears to be in some discomfort from his symptoms.  HEENT: Supple. Atraumatic normocephalic. He has significant nasal congestion.  ABDOMEN: soft  EXTREMITIES: No edema   BACK: Normal.  SKIN: Normal by inspection.    MENTAL STATUS: Alert and oriented. Speech - moderately dysarthric which is his baseline; language and cognition are generally intact. Judgment and insight normal.   CRANIAL NERVES: Pupils are equal, round and reactive to light and accommodation; extra ocular movements are full, there is no significant nystagmus; visual fields are full; upper and lower facial muscles are normal in strength and symmetric, there is no flattening of the nasolabial folds; tongue is midline; uvula is midline; shoulder elevation is normal.  MOTOR: Normal tone, bulk and strength; no pronator drift.  COORDINATION: Left finger to nose is normal, right finger to nose is normal, No rest tremor; no intention tremor; no postural tremor; no bradykinesia.  REFLEXES: Deep tendon reflexes are symmetrical and normal.  SENSATION: Normal to light touch.           GNA NOTE [[[[[[[[[[[[[[[[[[[[[[[In summary, Travis [name] is a 56 y.o. male with PMH of HTN, HLD, DM and HA was admitted to [facility] for dizzy spells. 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 Other stroke work up all negative including MRA, CUS, EEG, TTE, only hemocysteine 19.1 and LDL 76. He was continued on ASA and pravastatin. CTA neck showed possible left V1 stenosis. However, he was admitted again on 02/28/15 for right SCA cerebellar infarct, had stroke work up again with TTE, TEE, hypercoagulable work up and 30 day cardiac event monitoring all negative except small PFO. He was put on dual antiplatelet and lipitor. EMG/NCS unremarkable. Continue topamax for headache prophylaxis.  Sleep study showed mild OSA and CPAP recommended. 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 Loop recorder showed afib in 02/2016. He was put on eliquis 5mg  bid and DAPT discontinued. ]]]]]]]]]]]]]]]]]]]]]]]]       Blood pressure 113/78, pulse 82, temperature 97.9 F (36.6 C), temperature source Oral, resp. rate 18, height 6\' 1"  (1.854 m), weight 169 lb 1.5 oz (76.7 kg), SpO2 97 %.  Past Medical History:  Diagnosis Date  . A-fib ([place]) 02/2016   found on loop recorder  . 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 Anxiety   . Arthritis   . Blood transfusion without reported diagnosis   . Chronic headaches   . Complication of anesthesia    bleed after last shoulder-had to stay overnight  . Depression   . Diabetes mellitus   . Diabetic peripheral neuropathy ([place]) 03/28/2013  . Diverticulosis   . Dizziness   . Hernia, inguinal   . Hyperlipidemia   .  Hypertension   . Impingement syndrome of left shoulder 08/02/2014  . 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 Neuropathy of lower extremity   . Night sweats    every once in a while  . Sleep apnea    no CPAP  . Stroke ([place]) 02/2015  . Syncope and collapse     Past Surgical History:  Procedure Laterality Date  . CERVICAL DISC SURGERY    . COLON SURGERY  2003   1/3 removed for diverticulitis  . COLONOSCOPY    . EP IMPLANTABLE DEVICE N/A 05/01/2015   Procedure:  Loop Recorder Insertion;  Surgeon: [name], MD;  Location: [place] [facility];  Service: Cardiovascular;  Laterality: N/A;  . INGUINAL HERNIA REPAIR Bilateral   . KNEE ARTHROSCOPY Right   . SHOULDER ARTHROSCOPY Right    1  . SHOULDER ARTHROSCOPY Left 08/02/2014   Procedure: LEFT SHOULDER SCOPE DEBRIDEMENT/ACROMIOPLASTY;  Surgeon: [name], MD;  Location: [place];  Service: Orthopedics;  Laterality: Left;  ANESTHESIA: GENERAL, PRE/POST OP SCALENE  . TEE WITHOUT CARDIOVERSION N/A 03/03/2015   Procedure: TRANSESOPHAGEAL ECHOCARDIOGRAM (TEE);  Surgeon: [name], MD;  Location[facility];  Service: Cardiology;  Laterality: N/A;  . UMBILICAL HERNIA REPAIR     with other hernia repair with mesh  . WRIST SURGERY Right    fusion    Family History  Problem Relation Age of Onset  . Stroke Father   . Hyperlipidemia Father   . Heart attack Sister 36  . Dementia Mother   . Diabetes Maternal Grandfather   . ALS Brother 16  . Colon cancer Neg Hx   . Esophageal cancer Neg Hx   . Stomach cancer Neg Hx   . Rectal cancer Neg Hx     Social History:  reports that he quit smoking about 38 years ago. His smoking use included Cigarettes. He started smoking about 45 years ago. He has a 8.00 pack-year smoking history. He quit smokeless tobacco use about 38 years ago. His smokeless tobacco use included Chew. He reports that he does not drink alcohol or use drugs.  Allergies: No Known Allergies  Medications: Prior to Admission medications   Medication Sig Start Date End Date Taking? Authorizing Provider  apixaban (ELIQUIS) 5 MG TABS tablet Take 1 tablet (5 mg total) by mouth 2 (two) times daily. 04/22/16  Yes Sherran Needs, NP  atorvastatin (LIPITOR) 20 MG tablet TAKE 1 TABLET BY MOUTH ONCE DAILY 03/05/16  Yes Kathyrn Drown, MD  Cholecalciferol (VITAMIN D) 400 UNITS capsule Take 400 Units by mouth daily.     Yes Historical Provider, MD  Cyanocobalamin (VITAMIN  B12 PO) Take by mouth daily.     Yes Historical Provider, MD  folic acid (FOLVITE) 1 MG tablet TAKE 1 TABLET BY MOUTH DAILY. 03/18/16  Yes Kathyrn Drown, MD  lisinopril (PRINIVIL,ZESTRIL) 2.5 MG tablet Take 1 tablet (2.5 mg total) by mouth every morning. 02/25/16  Yes Mikey Kirschner, MD  metFORMIN (GLUCOPHAGE) 500 MG tablet TAKE 1 TABLETS BY MOUTH 2 TIMES DAILY WITH A MEAL. 02/26/16  Yes Kathyrn Drown, MD  pregabalin (LYRICA) 100 MG capsule Take 1 capsule (100 mg total) by mouth 3 (three) times daily. 02/13/16  Yes Mikey Kirschner, MD  topiramate (TOPAMAX) 25 MG tablet TAKE 2 TABLETS BY MOUTH TWICE DAILY 09/22/15  Yes Rosalin Hawking, MD  traMADol (ULTRAM) 50 MG tablet Take 1 tablet (50 mg total) by mouth 3 (three) times daily as  needed for moderate pain. 02/25/16  Yes Mikey Kirschner, MD  valACYclovir (VALTREX) 1000 MG tablet TAKE 1 TABLET BY MOUTH DAILY. 01/16/16  Yes Kathyrn Drown, MD    Scheduled Meds: . aspirin  300 mg Rectal Daily   Or  . aspirin  325 mg Oral Daily  . atorvastatin  40 mg Oral Daily  . feeding supplement (GLUCERNA SHAKE)  237 mL Oral TID BM  . folic acid  1 mg Oral Daily  . insulin aspart  0-15 Units Subcutaneous TID WC  . insulin aspart  0-5 Units Subcutaneous QHS  . insulin aspart  3 Units Subcutaneous TID WC  . pregabalin  100 mg Oral TID  . topiramate  50 mg Oral BID  . valACYclovir  1,000 mg Oral Daily   Continuous Infusions: . sodium chloride 50 mL/hr at 05/06/16 0541   PRN Meds:.acetaminophen **OR** acetaminophen (TYLENOL) oral liquid 160 mg/5 mL **OR** acetaminophen, HYDROcodone-acetaminophen, senna-docusate, traMADol     Results for orders placed or performed during the hospital encounter of 05/02/16 (from the past 48 hour(s))  Glucose, capillary     Status: Abnormal   Collection Time: 05/04/16 11:59 AM  Result Value Ref Range   Glucose-Capillary 323 (H) 65 - 99 mg/dL   Comment 1 Notify RN    Comment 2 Document in Chart   Glucose, capillary      Status: Abnormal   Collection Time: 05/04/16  4:28 PM  Result Value Ref Range   Glucose-Capillary 171 (H) 65 - 99 mg/dL   Comment 1 Notify RN    Comment 2 Document in Chart   Glucose, capillary     Status: Abnormal   Collection Time: 05/04/16  8:35 PM  Result Value Ref Range   Glucose-Capillary 215 (H) 65 - 99 mg/dL   Comment 1 Notify RN    Comment 2 Document in Chart   Influenza panel by PCR (type A & B, H1N1)     Status: None   Collection Time: 05/04/16  8:43 PM  Result Value Ref Range   Influenza A By PCR NEGATIVE NEGATIVE   Influenza B By PCR NEGATIVE NEGATIVE    Comment: (NOTE) The Xpert Xpress Flu assay is intended as an aid in the diagnosis of  influenza and should not be used as a sole basis for treatment.  This  assay is FDA approved for nasopharyngeal swab specimens only. Nasal  washings and aspirates are unacceptable for Xpert Xpress Flu testing.   Culture, blood (routine x 2)     Status: None (Preliminary result)   Collection Time: 05/04/16  9:10 PM  Result Value Ref Range   Specimen Description BLOOD RIGHT FOREARM    Special Requests BOTTLES DRAWN AEROBIC AND ANAEROBIC 6CC    Culture NO GROWTH 2 DAYS    Report Status PENDING   Culture, blood (routine x 2)     Status: None (Preliminary result)   Collection Time: 05/04/16  9:10 PM  Result Value Ref Range   Specimen Description BLOOD RIGHT HAND    Special Requests BOTTLES DRAWN AEROBIC AND ANAEROBIC 6CC    Culture NO GROWTH 2 DAYS    Report Status PENDING   Lactic acid, plasma     Status: None   Collection Time: 05/04/16  9:10 PM  Result Value Ref Range   Lactic Acid, Venous 1.4 0.5 - 1.9 mmol/L  Lactic acid, plasma     Status: None   Collection Time: 05/04/16 11:31 PM  Result Value Ref Range  Lactic Acid, Venous 1.0 0.5 - 1.9 mmol/L  Glucose, capillary     Status: Abnormal   Collection Time: 05/05/16  7:47 AM  Result Value Ref Range   Glucose-Capillary 179 (H) 65 - 99 mg/dL   Comment 1 Notify RN     Comment 2 Document in Chart   Respiratory Panel by PCR     Status: Abnormal   Collection Time: 05/05/16  9:52 AM  Result Value Ref Range   Adenovirus NOT DETECTED NOT DETECTED   Coronavirus 229E NOT DETECTED NOT DETECTED   Coronavirus HKU1 NOT DETECTED NOT DETECTED   Coronavirus NL63 NOT DETECTED NOT DETECTED   Coronavirus OC43 NOT DETECTED NOT DETECTED   Metapneumovirus NOT DETECTED NOT DETECTED   Rhinovirus / Enterovirus NOT DETECTED NOT DETECTED   Influenza A NOT DETECTED NOT DETECTED   Influenza B NOT DETECTED NOT DETECTED   Parainfluenza Virus 1 NOT DETECTED NOT DETECTED   Parainfluenza Virus 2 NOT DETECTED NOT DETECTED   Parainfluenza Virus 3 NOT DETECTED NOT DETECTED   Parainfluenza Virus 4 NOT DETECTED NOT DETECTED   Respiratory Syncytial Virus DETECTED (A) NOT DETECTED    Comment: CRITICAL RESULT CALLED TO, READ BACK BY AND VERIFIED WITH: Cory Roughen RN 16:25 05/05/16 ([email or web address])    Bordetella pertussis NOT DETECTED NOT DETECTED   Chlamydophila pneumoniae NOT DETECTED NOT DETECTED   Mycoplasma pneumoniae NOT DETECTED NOT DETECTED    Comment: Performed at [facility]  Glucose, capillary     Status: Abnormal   Collection Time: 05/05/16 11:08 AM  Result Value Ref Range   Glucose-Capillary 296 (H) 65 - 99 mg/dL   Comment 1 Notify RN    Comment 2 Document in Chart   Glucose, capillary     Status: Abnormal   Collection Time: 05/05/16  4:33 PM  Result Value Ref Range   Glucose-Capillary 177 (H) 65 - 99 mg/dL   Comment 1 Notify RN    Comment 2 Document in Chart   Glucose, capillary     Status: Abnormal   Collection Time: 05/05/16  8:31 PM  Result Value Ref Range   Glucose-Capillary 296 (H) 65 - 99 mg/dL   Comment 1 Notify RN    Comment 2 Document in Chart   Glucose, capillary     Status: Abnormal   Collection Time: 05/06/16  7:30 AM  Result Value Ref Range   Glucose-Capillary 189 (H) 65 - 99 mg/dL   Comment 1 Notify RN    Comment 2 Document in Chart      Studies/Results:   CXR normal   BRAIN MRI FINDINGS: Brain: 5 mm focus of confluent restricted diffusion along the dorsal left medulla (series 4, image 58). Associated T2 and FLAIR hyperintensity with no hemorrhage or mass effect.  No other restricted diffusion. Chronic small infarcts in both cerebellar hemispheres, including the right SCA territory. No chronic ischemia evident in the brainstem. Chronic lacune of the left basal ganglia. Small area of chronic cortical encephalomalacia in the anterior right frontal operculum with mild white matter gliosis. Outside of the acute finding gray and white matter signal is stable. No chronic cerebral blood products.  No midline shift, mass effect, evidence of mass lesion, ventriculomegaly, extra-axial collection or acute intracranial hemorrhage. Cervicomedullary junction and pituitary are within normal limits.  Vascular: Major intracranial vascular flow voids are stable with generalized intracranial artery dolichoectasia.  Skull and upper cervical spine: Negative.  Sinuses/Orbits: Stable and negative orbits soft tissues. Visualized paranasal sinuses and mastoids are stable  and well pneumatized. , mild ethmoid mucosal thickening.  Other: Visible internal auditory structures appear normal. Negative scalp soft tissues.  IMPRESSION: 1. Small acute lacunar infarct in the left dorsal medulla. No associated hemorrhage or mass effect. 2. Otherwise stable chronic small and medium-sized vessel ischemia with both anterior and posterior circulation involvement since 2016.       CAROTID DOPPLERS Normal carotid Doppler ultrasound.      HEAD NECK CTA FINDINGS: CTA NECK FINDINGS  Aortic arch: Normal  Right carotid system: Common carotid artery widely patent to the bifurcation region. Carotid bifurcation is normal without visible atherosclerosis. Cervical ICA is normal.  Left carotid system: Common carotid artery  widely patent to the bifurcation region. Carotid bifurcation is normal without atherosclerosis. Cervical internal carotid artery is normal.  Vertebral arteries: Right vertebral artery dominant. Both vertebral artery origins widely patent. Both vertebral arteries widely patent through the cervical region.  Skeleton: Ordinary cervical spondylosis.  Previous ACDF C5-6.  Other neck: No soft tissue lesion.  Upper chest: Normal  Review of the MIP images confirms the above findings  CTA HEAD FINDINGS  Anterior circulation: Both internal carotid arteries are widely patent through the siphon region. The anterior and middle cerebral arteries are patent bilaterally, but there is chronic stenosis of the posterior left M2 branch.  Posterior circulation: Both vertebral arteries are widely patent at the foramen magnum level. There is atherosclerosis of the distal right vertebral artery with mild ectasia. No stenosis. No focal left vertebral finding. Basilar artery is widely patent. Posterior circulation branch vessels are normal.  Venous sinuses: Patent and normal  Anatomic variants: None significant  Delayed phase: No abnormal enhancement  Review of the MIP images confirms the above findings  IMPRESSION: No atherosclerotic disease in the upper chest or the neck.  Mild narrowing of the posterior left M2 branch as seen previously. No evidence of disease progression. No evidence of acute embolic occlusion.  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         HEAD CTA FINDINGS: Brain: Old infarcts in the right cerebellar hemisphere. Old lacunar infarct in the left basal ganglia. No acute intracranial abnormality. Specifically, no hemorrhage, hydrocephalus, mass lesion, acute infarction, or significant intracranial injury.  Vascular: No hyperdense vessel or unexpected calcification.  Skull: No  acute calvarial abnormality.  Sinuses/Orbits: Visualized paranasal sinuses and mastoids clear. Orbital soft tissues unremarkable.  Other: None  IMPRESSION: Old right cerebellar infarcts and lacunar infarct in the left basal ganglia.  No acute intracranial abnormality.             Travis [name] A. Travis [name], M.D.  Diplomate, Tax adviser of Psychiatry and Neurology ( Neurology). 05/06/2016, 8:41 AM

## 2016-05-08 LAB — CUP PACEART REMOTE DEVICE CHECK
Implantable Pulse Generator Implant Date: 20161229
MDC IDC SESS DTM: 20171124160740

## 2016-05-08 NOTE — Progress Notes (Signed)
Carelink summary report received. Battery status OK. Normal device function. No new symptom episodes, tachy episodes, brady, or pause episodes. 1 AF 0.2% ECG appears AF +Eliquis. Monthly summary reports and ROV/PRN

## 2016-05-09 LAB — CULTURE, BLOOD (ROUTINE X 2)
CULTURE: NO GROWTH
CULTURE: NO GROWTH

## 2016-05-10 NOTE — Telephone Encounter (Signed)
Talked with wife over the phone. She stated that pt is compliant with eliquis but still had small medullary infarct on 05/02/16. He is back on eliquis now. Wife also said he had some nose bleed lately but not severe. I discussed with her about possibility to add ASA to the eliquis. Wife is requesting an earlier appointment with me. I will let Adonis Huguenin to find out if we can have slot with him earlier. I like to see him to discuss about ASA and nose bleed. Thanks.   Rosalin Hawking, MD PhD Stroke Neurology 05/10/2016 5:59 PM

## 2016-05-11 NOTE — Telephone Encounter (Signed)
Pt schedule for appt on 05/12/2016 at 0800 spoke with wife.

## 2016-05-12 ENCOUNTER — Ambulatory Visit (INDEPENDENT_AMBULATORY_CARE_PROVIDER_SITE_OTHER): Payer: 59 | Admitting: Neurology

## 2016-05-12 ENCOUNTER — Encounter: Payer: Self-pay | Admitting: Neurology

## 2016-05-12 VITALS — BP 119/78 | HR 79 | Wt 176.0 lb

## 2016-05-12 DIAGNOSIS — E785 Hyperlipidemia, unspecified: Secondary | ICD-10-CM

## 2016-05-12 DIAGNOSIS — I639 Cerebral infarction, unspecified: Secondary | ICD-10-CM

## 2016-05-12 DIAGNOSIS — I10 Essential (primary) hypertension: Secondary | ICD-10-CM | POA: Diagnosis not present

## 2016-05-12 DIAGNOSIS — Q211 Atrial septal defect: Secondary | ICD-10-CM | POA: Diagnosis not present

## 2016-05-12 DIAGNOSIS — I480 Paroxysmal atrial fibrillation: Secondary | ICD-10-CM | POA: Diagnosis not present

## 2016-05-12 DIAGNOSIS — G4733 Obstructive sleep apnea (adult) (pediatric): Secondary | ICD-10-CM | POA: Diagnosis not present

## 2016-05-12 DIAGNOSIS — I6340 Cerebral infarction due to embolism of unspecified cerebral artery: Secondary | ICD-10-CM | POA: Diagnosis not present

## 2016-05-12 DIAGNOSIS — Q2112 Patent foramen ovale: Secondary | ICD-10-CM

## 2016-05-12 MED ORDER — ASPIRIN EC 81 MG PO TBEC: 81.0000 mg | DELAYED_RELEASE_TABLET | Freq: Every day | ORAL | Status: DC

## 2016-05-12 NOTE — Patient Instructions (Addendum)
-   continue eliquis and lipitor for stroke prevention  - will add ASA 81mg  daily for stroke prevention too - follow up with PCP to arrange OSA treatment - refer for outpt PT/OT and speech therapy - Follow up with your primary care physician for stroke risk factor modification. Recommend maintain blood pressure goal <130/80, diabetes with hemoglobin A1c goal below 6.5% and lipids with LDL cholesterol goal below 70 mg/dL.  - continue topamax to 50mg  twice a day for headache prevention - check BP and glucose at home and record - follow up in 4 months with me.
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
## 2016-05-14 ENCOUNTER — Encounter: Payer: Self-pay | Admitting: Family Medicine

## 2016-05-14 ENCOUNTER — Ambulatory Visit (INDEPENDENT_AMBULATORY_CARE_PROVIDER_SITE_OTHER): Payer: 59 | Admitting: Family Medicine

## 2016-05-14 VITALS — BP 110/80 | Temp 98.5°F | Ht 73.0 in | Wt 172.0 lb

## 2016-05-14 DIAGNOSIS — F411 Generalized anxiety disorder: Secondary | ICD-10-CM

## 2016-05-14 DIAGNOSIS — I480 Paroxysmal atrial fibrillation: Secondary | ICD-10-CM | POA: Diagnosis not present

## 2016-05-14 DIAGNOSIS — I10 Essential (primary) hypertension: Secondary | ICD-10-CM

## 2016-05-14 DIAGNOSIS — E1149 Type 2 diabetes mellitus with other diabetic neurological complication: Secondary | ICD-10-CM | POA: Diagnosis not present

## 2016-05-14 DIAGNOSIS — I6930 Unspecified sequelae of cerebral infarction: Secondary | ICD-10-CM | POA: Diagnosis not present

## 2016-05-14 MED ORDER — AMOXICILLIN-POT CLAVULANATE 875-125 MG PO TABS: 1.0000 | ORAL_TABLET | Freq: Two times a day (BID) | ORAL | 0 refills | Status: DC

## 2016-05-14 MED ORDER — ALPRAZOLAM 0.25 MG PO TABS: 0.2500 mg | ORAL_TABLET | Freq: Two times a day (BID) | ORAL | 0 refills | Status: DC | PRN

## 2016-05-14 NOTE — Progress Notes (Signed)
   Subjective:    Patient ID: Travis [name], male    DOB: 04-14-1961, 56 y.o.   MRN: [id number]  HPI Patient is here today for a hospital follow up visit. Patient was treated at [facility] on 05/02/16 for a stroke. Patient has also recently been diagnosed with RSV. Patient states that he feels worse. Congestion, cough and weight loss noted.   This patient was recently in the hospital with significant neurologic issues as well as dizziness congestion coughing He was diagnosed with RSV in since then he's been coughing up discolored phlegm he denies high fever chills sweats He states his breathing overall doing decent  He also was diagnosed with a small stroke he states at one time they mention the possibility of a lumbar puncture but he does not know if he needs this is why feels like he is doing fairly well but she does state that he seems to be anxious a lot and on edge.  Long discussion held with the patient regarding anxiety depression patient denies being depressed and states his anxiousness is just related to his health as well as his stepson who he does not get along with     Review of Systems  Constitutional: Negative for activity change, appetite change and fatigue.  HENT: Negative for congestion.   Respiratory: Negative for cough.   Cardiovascular: Negative for chest pain.  Gastrointestinal: Negative for abdominal pain.  Endocrine: Negative for polydipsia and polyphagia.  Neurological: Negative for weakness.  Psychiatric/Behavioral: Negative for confusion.   Patient is still on Eliquis doing well with this no bleeding issues. Dysarthria has been stable He is taking his cholesterol medicine and his blood pressure medicine His atrial fibrillation is stable on Eliquis He was discharged on no antibiotics.    Objective:   Physical Exam  Constitutional: He appears well-nourished. No distress.  Cardiovascular: Normal rate, regular rhythm and normal heart sounds.   No murmur  heard. Pulmonary/Chest: Effort normal and breath sounds normal. No respiratory distress.  Musculoskeletal: He exhibits no edema.  Lymphadenopathy:    He has no cervical adenopathy.  Neurological: He is alert.  Psychiatric: His behavior is normal.  Vitals reviewed.   Moderate sinus congestion drainage with mucoid drainage no sign and pneumonia  It is hard to 100% pinpoint the source of his strokes but I believe it is acutely related to atrial fibrillation, hypertension, hyperlipidemia, diabetes    Assessment & Plan:  Dysarthria stable Hypertension good control continue medicine Hyperlipidemia stroke prevention continue statin Diabetes under good control continue current measures Atrial fibrillation stable on anticoagulant RSV with probable secondary sinus infection antibiotics prescribed warning signs discussed follow-up if progressive troubles Stress-related issues patient does not one of be on any type of day and day out medicine. He was prescribed low-dose Xanax that he can use when he is very stressed out for home use only. Not for frequent use. We will monitor this  Transitional care was completed

## 2016-05-15 DIAGNOSIS — F411 Generalized anxiety disorder: Secondary | ICD-10-CM | POA: Insufficient documentation

## 2016-05-17 ENCOUNTER — Ambulatory Visit (HOSPITAL_COMMUNITY): Payer: 59 | Attending: Neurology | Admitting: Speech Pathology

## 2016-05-17 ENCOUNTER — Ambulatory Visit (HOSPITAL_COMMUNITY): Payer: 59 | Admitting: Specialist

## 2016-05-17 DIAGNOSIS — M6281 Muscle weakness (generalized): Secondary | ICD-10-CM | POA: Diagnosis not present

## 2016-05-17 DIAGNOSIS — R29898 Other symptoms and signs involving the musculoskeletal system: Secondary | ICD-10-CM | POA: Insufficient documentation

## 2016-05-17 DIAGNOSIS — R278 Other lack of coordination: Secondary | ICD-10-CM | POA: Diagnosis not present

## 2016-05-17 DIAGNOSIS — R41841 Cognitive communication deficit: Secondary | ICD-10-CM | POA: Insufficient documentation

## 2016-05-17 DIAGNOSIS — I69822 Dysarthria following other cerebrovascular disease: Secondary | ICD-10-CM | POA: Insufficient documentation

## 2016-05-17 DIAGNOSIS — R471 Dysarthria and anarthria: Secondary | ICD-10-CM | POA: Diagnosis not present

## 2016-05-17 DIAGNOSIS — R2681 Unsteadiness on feet: Secondary | ICD-10-CM | POA: Insufficient documentation

## 2016-05-17 DIAGNOSIS — R2689 Other abnormalities of gait and mobility: Secondary | ICD-10-CM | POA: Insufficient documentation

## 2016-05-17 NOTE — Therapy (Signed)
[place], [place], [address] Phone: [phone]   Fax:  [phone]  Speech Language Pathology Evaluation  Patient Details  Name: Travis [name] MRN: [id number] Date of Birth: 1960/10/14 Referring Provider: Rosalin Hawking  Encounter Date: 05/17/2016      End of Session - 05/17/16 1556    Visit Number 1   Number of Visits 8   Date for SLP Re-Evaluation 06/17/16   Authorization Type [facility]   Authorization Time Period 05/17/2016-06/17/2016   SLP Start Time 1444   SLP Stop Time  1520   SLP Time Calculation (min) 36 min   Activity Tolerance Patient tolerated treatment well      Past Medical History:  Diagnosis Date  . A-fib ([place]) 02/2016   found on loop recorder  . 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 Anxiety   . Arthritis   . Blood transfusion without reported diagnosis   . Chronic headaches   . Complication of anesthesia    bleed after last shoulder-had to stay overnight  . Depression   . Diabetes mellitus   . Diabetic peripheral neuropathy ([place]) 03/28/2013  . Diverticulosis   . Dizziness   . Hernia, inguinal   . Hyperlipidemia   . Hypertension   . Impingement syndrome of left shoulder 08/02/2014  . 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 Neuropathy of lower extremity   . Night sweats    every once in a while  . Sleep apnea    no CPAP  . Stroke ([place]) 02/2015  . Syncope and collapse     Past Surgical History:  Procedure Laterality Date  . CERVICAL DISC SURGERY    . COLON SURGERY  2003   1/3 removed for diverticulitis  . COLONOSCOPY    . EP IMPLANTABLE DEVICE N/A 05/01/2015   Procedure: Loop Recorder Insertion;  Surgeon: [name], MD;  Location: [facility];  Service: Cardiovascular;  Laterality: N/A;  . INGUINAL HERNIA REPAIR Bilateral   . KNEE ARTHROSCOPY Right   . SHOULDER ARTHROSCOPY Right    1  . SHOULDER ARTHROSCOPY Left 08/02/2014   Procedure: LEFT SHOULDER SCOPE  DEBRIDEMENT/ACROMIOPLASTY;  Surgeon: [name], MD;  Location: [facility];  Service: Orthopedics;  Laterality: Left;  ANESTHESIA: GENERAL, PRE/POST OP SCALENE  . TEE WITHOUT CARDIOVERSION N/A 03/03/2015   Procedure: TRANSESOPHAGEAL ECHOCARDIOGRAM (TEE);  Surgeon: [name], MD;  [facility];  Service: Cardiology;  Laterality: N/A;  . UMBILICAL HERNIA REPAIR     with other hernia repair with mesh  . WRIST SURGERY Right    fusion    There were no vitals filed for this visit.      Subjective Assessment - 05/17/16 1459    Subjective "A little slower on my words..."   Currently in Pain? No/denies            SLP Evaluation [facility] - 05/17/16 1459      SLP Visit Information   SLP Received On 05/17/16   Referring Provider Rosalin Hawking   Onset Date 05/02/2016   Medical Diagnosis CVA     Subjective   Subjective "I'm a little slower on my words."   Patient/Family Stated Goal "My speech"     General Information   HPI Travis [name] is a 56 yo male who was referred for SLE by his neurologist, Dr. Erlinda Hong. Travis [name] is known to this SLP from previous cognitive linguistic therapy following a stroke.  He was admitted to [facility] on 05/02/16 due to dizziness, leaning towards left, slurry speech, left arm numbnessand generalized weakness. MRI showed left dorsal medulla small infarct. CTA head and neck, CUS and TTE unremarkable. LDL 60 and A1C 5.4. He had cough and found to have RSV URI. He was continued on eliquis and lipitor on discharge. Since discharge, pt has been doing better. Stated that speech much clearer but slower, still has left forearm bandlike numbness and tightness feeling, more around wrist. Motor back to baseline, still walk with cane for safety. BP 119/78. Wife still complains of s/s of OSA, pt was diagnosed with OSA but refused CPAP. May consider nasal pillow, he will follow up with PCP next week to arrange.   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   Type of Home House    Lives With Spouse   Available Support Family   Education Some college   Vocation On disability     Pain Assessment   Pain Assessment No/denies pain     Cognition   Overall Cognitive Status History of cognitive impairments - at baseline   Attention Selective   Selective Attention Impaired   Selective Attention Impairment Verbal complex   Memory Impaired   Memory Impairment Retrieval deficit;Decreased recall of new information;Decreased short term memory   Decreased Short Term Memory Verbal complex   Awareness Appears intact   Problem Solving Appears intact     Auditory Comprehension   Overall Auditory Comprehension Appears within functional limits for tasks assessed   Yes/No Questions Within Functional Limits   Commands Within Functional Limits   Conversation Complex   Other Conversation Comments Pt tends to repeat information aloud due to memory deficits   Interfering Components Attention;Anxiety;Working Librarian, academic Within [employer]     Reading Comprehension   Reading Status Within funtional limits     Expression   Primary Mode of Expression Verbal     Verbal Expression   Overall Verbal Expression Appears within functional limits for tasks assessed   Initiation No impairment   Level of Generative/Spontaneous Verbalization Conversation   Repetition Impaired   Level of Impairment Sentence level   Naming No impairment   Pragmatics Impairment   Impairments Turn Taking;Interpretation of nonverbal communication   Interfering Components Attention;Speech intelligibility   Non-Verbal Means of Communication Not applicable      Written Expression   Dominant Hand Right   Written Expression Not tested     Oral Motor/Sensory Function   Overall Oral Motor/Sensory Function Impaired   Labial ROM Within Functional Limits   Labial Symmetry Within Functional Limits   Labial Strength Within Functional Limits   Labial Sensation Within Functional Limits   Labial Coordination WFL   Lingual ROM Within Functional Limits   Lingual Symmetry Within Functional Limits   Lingual Strength Within Functional Limits   Lingual Sensation Within Functional Limits   Lingual Coordination Reduced   Facial ROM Within Functional Limits   Facial Symmetry Within Functional Limits   Facial Strength Within Functional Limits   Facial Sensation Within Functional Limits   Facial Coordination WFL   Velum Within Functional Limits  tip deviates slightly left   Mandible Within Functional Limits     Motor Speech  Overall Motor Speech Impaired   Respiration Within functional limits   Phonation Normal   Resonance Within functional limits   Articulation Impaired   Level of Impairment Conversation   Intelligibility Intelligibility reduced   Word 75-100% accurate   Phrase 75-100% accurate   Sentence 75-100% accurate   Conversation 75-100% accurate   Motor Planning Witnin functional limits   Motor Speech Errors Aware   Interfering Components Premorbid status   Effective Techniques Slow rate;Over-articulate;Pause   Phonation WFL     Standardized Assessments   Standardized Assessments  Montreal Cognitive Assessment [facility])   Montreal Cognitive Assessment (MOCA)  23/30           SLP Education - 05/17/16 1555    Education provided Yes   Education Details Results of evaluation and plan for short term treatment   Person(s) Educated Patient   Methods Explanation   Comprehension Verbalized understanding          SLP Short Term Goals - 05/17/16 1605      SLP SHORT TERM GOAL #1   Title Pt will implement memory strategies in functional  tasks for 90% acc with min assist   Baseline 2/5 recall of words without cues   Time 4   Period Weeks   Status New     SLP SHORT TERM GOAL #2   Title Pt will implement speech intelligibility strategies during conversational speech to increase naturalness and accuracy with 95% accuracy as judged by SLP in 1:1 conversations.   Baseline Pt with hesitations, repetitions, revisions of whole words in conversation 20% of the time.   Time 4   Period Weeks   Status New     SLP SHORT TERM GOAL #3   Title Pt will verbalize at least 4 compensatory strategies for speech intelligibility and memory with indirect cues.   Baseline Pt verbalizes 2 with cues   Time 4   Period Weeks   Status New          SLP Long Term Goals - 05/17/16 1610      SLP LONG TERM GOAL #1   Title Same as short term          Plan - 05/17/16 1558    Clinical Impression Statement Travis [name] presents with mild cognitive linguistic deficits characterized by impairments in attention, working and short term memory, orientation, and reduced speech intelligibility due to dysarthria. Pt has baseline deficits in these same areas from previous strokes, however his speech intelligibility is noted to be further impaired with this new stroke. Pt also reports exacerbation of short term memory deficits with this new stroke. Recommend short term skilled SLP therapy to address deficits and maximize return to baseline functioning.    Speech Therapy Frequency 2x / week   Duration 4 weeks   Treatment/Interventions SLP instruction and feedback;Compensatory strategies;Patient/family education   Potential to Achieve Goals Good   Potential Considerations Previous level of function   SLP Home Exercise Plan Pt will be independent with HEP as assigned to facilitate carryover of treatment strategies and techniques in home environment.   Consulted and Agree with Plan of Care Patient      Patient will benefit from skilled therapeutic  intervention in order to improve the following deficits and impairments:   Cognitive communication deficit  Dysarthria following other cerebrovascular disease    Problem List Patient Active Problem List   Diagnosis Date Noted  . Generalized anxiety disorder 05/15/2016  . Acute bronchiolitis due to respiratory syncytial virus (RSV)   .  Acute CVA (cerebrovascular accident) ([place]) 05/04/2016  . Dizziness 05/02/2016  . Anxiety and depression 05/02/2016  . Paroxysmal atrial fibrillation ([employer]) 03/02/2016  . PFO (patent foramen ovale) 07/24/2015  . Chronic deep vein thrombosis (DVT) of popliteal vein of left lower extremity ([place]) 07/24/2015  . OSA (obstructive sleep apnea) 04/18/2015  . 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 artery ([place]) 04/18/2015  . Cerebellar stroke ([place]) 02/28/2015  . Snoring 01/23/2015  . Degeneration of cervical intervertebral disc 01/23/2015  . Essential hypertension 12/14/2014  . Diabetes mellitus type II, controlled ([place]) 12/14/2014  . HLD (hyperlipidemia) 12/14/2014  . Neck pain 12/14/2014  . Cerebral infarction, chronic   . Vertigo 10/14/2014  . 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 Impingement syndrome of left shoulder 08/02/2014  . 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 Abdominal pain 06/13/2014  . Rectal bleeding 03/13/2014  . Diabetes type 2, controlled ([place]) 02/18/2014  . Diabetic peripheral neuropathy ([place]) 03/28/2013  . Irreducible incisional hernia 11/11/2010   Thank you,  Genene Churn, [place]  [place] 05/17/2016, 4:11 PM  [facility] [address] [place], [place], [address] Phone: [phone]   Fax:  [phone]  Name: Travis [name] MRN: [id number] Date of Birth: [birth year]

## 2016-05-18 NOTE — Therapy (Signed)
[place], [place], [address] Phone: [phone]   Fax:  [phone]  Occupational Therapy Evaluation  Patient Details  Name: Travis [name] MRN: [id number] Date of Birth: 1960-08-14 Referring Provider: Dr. Rosalin Hawking  Encounter Date: 05/17/2016      OT End of Session - 05/17/16 1659    Visit Number 1   Number of Visits 16   Date for OT Re-Evaluation 07/16/16   Authorization Type [place] [facility]    OT Start Time 1520   OT Stop Time 1600   OT Time Calculation (min) 40 min   Activity Tolerance Patient tolerated treatment well   Behavior During Therapy [facility] for tasks assessed/performed      Past Medical History:  Diagnosis Date  . A-fib ([place]) 02/2016   found on loop recorder  . 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 Anxiety   . Arthritis   . Blood transfusion without reported diagnosis   . Chronic headaches   . Complication of anesthesia    bleed after last shoulder-had to stay overnight  . Depression   . Diabetes mellitus   . Diabetic peripheral neuropathy ([place]) 03/28/2013  . Diverticulosis   . Dizziness   . Hernia, inguinal   . Hyperlipidemia   . Hypertension   . Impingement syndrome of left shoulder 08/02/2014  . 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 Neuropathy of lower extremity   . Night sweats    every once in a while  . Sleep apnea    no CPAP  . Stroke ([place]) 02/2015  . Syncope and collapse     Past Surgical History:  Procedure Laterality Date  . CERVICAL DISC SURGERY    . COLON SURGERY  2003   1/3 removed for diverticulitis  . COLONOSCOPY    . EP IMPLANTABLE DEVICE N/A 05/01/2015   Procedure: Loop Recorder Insertion;  Surgeon: [name], MD;  Location: [facility];  Service: Cardiovascular;  Laterality: N/A;  . INGUINAL HERNIA REPAIR Bilateral   . KNEE ARTHROSCOPY Right   . SHOULDER ARTHROSCOPY Right    1  . SHOULDER ARTHROSCOPY Left 08/02/2014   Procedure: LEFT  SHOULDER SCOPE DEBRIDEMENT/ACROMIOPLASTY;  Surgeon: [name], MD;  Location: [place];  Service: Orthopedics;  Laterality: Left;  ANESTHESIA: GENERAL, PRE/POST OP SCALENE  . TEE WITHOUT CARDIOVERSION N/A 03/03/2015   Procedure: TRANSESOPHAGEAL ECHOCARDIOGRAM (TEE);  Surgeon: [name], MD;  [facility];  Service: Cardiology;  Laterality: N/A;  . UMBILICAL HERNIA REPAIR     with other hernia repair with mesh  . WRIST SURGERY Right    fusion    There were no vitals filed for this visit.      Subjective Assessment - 05/17/16 1654    Subjective  S:  I know my left grip strength isnt what it used to be.   Pertinent History Travis [name] was hospitalized on 05/02/16 for a Acute lacunar infarct in the left dorsal Medulla: patient  presented with dizziness and dysarthria.  He was hospitalized from 05/02/16-05/06/16 and was then discharged home with referrals to [place], OT, ST.  Patient presents today for his OT referral.     Patient Stated Goals I want to get the strength back in my hand and get rid of the numbness.     Currently in Pain? No/denies   Pain Score 0-No pain           [facility] OT Assessment -  05/18/16 0001      Assessment   Diagnosis LUE Weakness S/P CVA   Referring Provider Dr. Rosalin Hawking   Onset Date 05/02/16     Precautions   Precautions None     Restrictions   Weight Bearing Restrictions No     Balance Screen   Has the patient fallen in the past 6 months No   Has the patient had a decrease in activity level because of a fear of falling?  No   Is the patient reluctant to leave their home because of a fear of falling?  No     Home  Environment   Family/patient expects to be discharged to: Private residence   Available Help at Discharge Family   Lives With Spouse     Prior Function   Level of [place] On disability   Leisure enjoys spending time with friends, working in the yard, staying  active     ADL   ADL comments Patient has difficulty with gripping and opening containers with his left hand.  He has difficulty with in hand manipulation tasks.      Written Expression   Dominant Hand Right     Activity Tolerance   Activity Tolerance Tolerates 30 min activity with muliple rests     Observation/Other Assessments   Focus on Therapeutic Outcomes (FOTO)  clinical judgement     Sensation   Light Touch Appears Intact  grossly assessed and intact, does have numbness sensation   Stereognosis Appears Intact  3/3 correct      Coordination   9 Hole Peg Test Right;Left   Right 9 Hole Peg Test 26.74 seconds   Left 9 Hole Peg Test 25.76 seconds   Other in hand manipulation is decreased compared to right hand     ROM / Strength   AROM / PROM / Strength Strength     Strength   Overall Strength Comments RUE strength is 5/5, LUE shoulder, elbow, forearm, wrist is 4-/5     Hand Function   Right Hand Grip (lbs) 86   Right Hand Lateral Pinch 21.5 lbs   Right Hand 3 Point Pinch 20 lbs   Left Hand Grip (lbs) 58   Left Hand Lateral Pinch 16 lbs   Left 3 point pinch 14 lbs                         OT Education - 05/17/16 1658    Education provided Yes   Education Details educated on left grip and pinch strength with light resist red theraputty, proximal shoulder strengthening exercises for left shoulder.  Provided handwritten handout, pictorial handout not available in EPIC.   Person(s) Educated Patient   Methods Explanation;Demonstration;Handout   Comprehension Verbalized understanding          OT Short Term Goals - 05/18/16 0940      OT SHORT TERM GOAL #1   Title Pt will be educated on and independent in HEP.    Time 4   Period Weeks   Status New     OT SHORT TERM GOAL #2   Title Patient will increase left upper extremity strength to 4/5 for improved ability to complete yard work.   Time 4   Period Weeks   Status New     OT SHORT TERM GOAL  #3   Title Pt will increase LUE grip strength by 10# to increase ability to hold onto 2 liter  drink bottles.    Time 4   Period Weeks   Status New     OT SHORT TERM GOAL #4   Title Patient will increase left hand pinch strength by 2 pounds for improved ability to manipulate household items.    Time 4   Period Weeks   Status New     OT SHORT TERM GOAL #5   Title Patient will increase sustained activity tolerance by 25% for improved ability to complete daily activities.    Time 4   Period Weeks           OT Long Term Goals - 05/18/16 [id number]      OT LONG TERM GOAL #1   Title Patient will return to highest level of independence with all daily and leisure tasks.    Time 8   Period Weeks   Status New     OT LONG TERM GOAL #2   Title Patient will demonstrate 4+/5 strength in his LUE for improved ability to particpate in yard work.   Time 8   Period Weeks   Status New     OT LONG TERM GOAL #3   Title Patient will improve left grip strength by 20# and pinch strength by 4# for improved ability to open containers and manipulate objects.    Time 8   Period Months   Status New     OT LONG TERM GOAL #4   Title Patient will improve sustained activity tolerance by 50% for increased independence with all daily activities.    Time 8   Period Weeks   Status New     OT LONG TERM GOAL #5   Title Patient will complete in hand manipulation activities without difficulty.    Time 8   Period Weeks   Status New     Long Term Additional Goals   Additional Long Term Goals Yes     OT LONG TERM GOAL #6   Title Patient will be educated on energy conservation and work simplification principles and independently apply to daily tasks for improved safety.    Time 8   Period Weeks   Status New               Plan - 05/17/16 1700    Clinical Impression Statement A: Patient is a 56 year old male with history of hypertension, diabetes and previous stroke, and rotator cuff tear. Presents  to the hospital with complaints of dizziness and generalized weakness on 05/02/16 and was admitted for a lacunar infarct in the left medulla.  Patient was hospitaized through 05/06/16.  Patient reports numbness and decreased strength in his left arm and hand, which are causing decreased independence with B/IADLs and leisure activities.    Rehab Potential Good   OT Frequency 2x / week   OT Duration 8 weeks   OT Treatment/Interventions Self-care/ADL training;Cryotherapy;Electrical Stimulation;Moist Heat;Parrafin;Ultrasound;Therapeutic exercise;Neuromuscular education;Energy conservation;DME and/or AE instruction;Manual Therapy;Patient/family education;Therapeutic exercises;Therapeutic activities   Plan P:  Skilled OT intervention to improve LUE strength, grip strength, and coordination needed to return to prior level of independence with all B/IADLs and leisure activities.  Next session:  review HEP, begin LUE strengthening, grip and pinch strengthening, and [place] focusing on inhand manipuation.     OT Home Exercise Plan 05/17/16:  proximal shoulder strengthening, theraputty for grip and pinch   Consulted and Agree with Plan of Care Patient      Patient will benefit from skilled therapeutic intervention in order to improve the following deficits  and impairments:  Decreased coordination, Decreased endurance, Decreased strength, Impaired UE functional use  Visit Diagnosis: Other symptoms and signs involving the musculoskeletal system - Plan: Ot plan of care cert/re-cert  Other lack of coordination - Plan: Ot plan of care cert/re-cert    Problem List Patient Active Problem List   Diagnosis Date Noted  . Generalized anxiety disorder 05/15/2016  . Acute bronchiolitis due to respiratory syncytial virus (RSV)   . Acute CVA (cerebrovascular accident) ([place]) 05/04/2016  . Dizziness 05/02/2016  . Anxiety and depression 05/02/2016  . Paroxysmal atrial fibrillation ([place]) 03/02/2016  . PFO (patent foramen  ovale) 07/24/2015  . Chronic deep vein thrombosis (DVT) of popliteal vein of left lower extremity ([place]) 07/24/2015  . OSA (obstructive sleep apnea) 04/18/2015  . 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 artery ([place]) 04/18/2015  . Cerebellar stroke ([place]) 02/28/2015  . Snoring 01/23/2015  . Degeneration of cervical intervertebral disc 01/23/2015  . Essential hypertension 12/14/2014  . Diabetes mellitus type II, controlled ([place]) 12/14/2014  . HLD (hyperlipidemia) 12/14/2014  . Neck pain 12/14/2014  . Cerebral infarction, chronic   . Vertigo 10/14/2014  . 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 Impingement syndrome of left shoulder 08/02/2014  . 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 Abdominal pain 06/13/2014  . Rectal bleeding 03/13/2014  . Diabetes type 2, controlled ([place]) 02/18/2014  . Diabetic peripheral neuropathy ([place]) 03/28/2013  . Irreducible incisional hernia 11/11/2010    Vangie Bicker, [place], OTR/L [phone]  05/18/2016, 9:48 AM  [facility] [address] [place], [place], [address] Phone: [phone]   Fax:  [phone]  Name: Travis [name] MRN: [id number] Date of Birth: 13-Mar-1961

## 2016-05-20 ENCOUNTER — Encounter (HOSPITAL_COMMUNITY): Payer: 59 | Admitting: Speech Pathology

## 2016-05-20 ENCOUNTER — Encounter (HOSPITAL_COMMUNITY): Payer: 59 | Admitting: Occupational Therapy

## 2016-05-24 ENCOUNTER — Encounter (HOSPITAL_COMMUNITY): Payer: Self-pay

## 2016-05-24 ENCOUNTER — Ambulatory Visit (HOSPITAL_COMMUNITY): Payer: 59 | Admitting: Physical Therapy

## 2016-05-24 ENCOUNTER — Other Ambulatory Visit: Payer: Self-pay | Admitting: *Deleted

## 2016-05-24 ENCOUNTER — Ambulatory Visit (HOSPITAL_COMMUNITY): Payer: 59

## 2016-05-24 ENCOUNTER — Telehealth: Payer: Self-pay | Admitting: Family Medicine

## 2016-05-24 DIAGNOSIS — M6281 Muscle weakness (generalized): Secondary | ICD-10-CM | POA: Diagnosis not present

## 2016-05-24 DIAGNOSIS — R2689 Other abnormalities of gait and mobility: Secondary | ICD-10-CM | POA: Diagnosis not present

## 2016-05-24 DIAGNOSIS — R2681 Unsteadiness on feet: Secondary | ICD-10-CM

## 2016-05-24 DIAGNOSIS — R41841 Cognitive communication deficit: Secondary | ICD-10-CM

## 2016-05-24 DIAGNOSIS — R29898 Other symptoms and signs involving the musculoskeletal system: Secondary | ICD-10-CM | POA: Diagnosis not present

## 2016-05-24 DIAGNOSIS — R278 Other lack of coordination: Secondary | ICD-10-CM | POA: Diagnosis not present

## 2016-05-24 DIAGNOSIS — R471 Dysarthria and anarthria: Secondary | ICD-10-CM

## 2016-05-24 DIAGNOSIS — I69822 Dysarthria following other cerebrovascular disease: Secondary | ICD-10-CM | POA: Diagnosis not present

## 2016-05-24 MED ORDER — ALPRAZOLAM 0.25 MG PO TABS: 0.2500 mg | ORAL_TABLET | Freq: Two times a day (BID) | ORAL | 1 refills | Status: DC | PRN

## 2016-05-24 NOTE — Therapy (Signed)
[place]rav[name], [place], [address] Phone: [phone]   Fax:  [phone]  Physical Therapy Evaluation  Patient Details  Name: Travis [name] MRN: [id number] Date of Birth: 04-01-1961 Referring Provider: [name] xu   Encounter Date: 05/24/2016      PT End of Session - 05/24/16 1705    Visit Number 1   Number of Visits 8   Date for PT Re-Evaluation 06/21/16   Authorization Type [name]    Authorization Time Period 05/24/16 to 06/24/16   PT Start Time 1436   PT Stop Time 1513   PT Time Calculation (min) 37 min   Equipment Utilized During Treatment Gait belt   Activity Tolerance Patient tolerated treatment well   Behavior During Therapy [facility] for tasks assessed/performed      Past Medical History:  Diagnosis Date  . A-fib ([place]) 02/2016   found on loop recorder  . 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 Anxiety   . Arthritis   . Blood transfusion without reported diagnosis   . Chronic headaches   . Complication of anesthesia    bleed after last shoulder-had to stay overnight  . Depression   . Diabetes mellitus   . Diabetic peripheral neuropathy ([place]) 03/28/2013  . Diverticulosis   . Dizziness   . Hernia, inguinal   . Hyperlipidemia   . Hypertension   . Impingement syndrome of left shoulder 08/02/2014  . 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 Neuropathy of lower extremity   . Night sweats    every once in a while  . Sleep apnea    no CPAP  . Stroke ([place]) 02/2015  . Syncope and collapse     Past Surgical History:  Procedure Laterality Date  . CERVICAL DISC SURGERY    . COLON SURGERY  2003   1/3 removed for diverticulitis  . COLONOSCOPY    . EP IMPLANTABLE DEVICE N/A 05/01/2015   Procedure: Loop Recorder Insertion;  Surgeon: [name], MD;  Location: [facility];  Service: Cardiovascular;  Laterality: N/A;  . INGUINAL HERNIA REPAIR Bilateral   . KNEE ARTHROSCOPY Right   .  SHOULDER ARTHROSCOPY Right    1  . SHOULDER ARTHROSCOPY Left 08/02/2014   Procedure: LEFT SHOULDER SCOPE DEBRIDEMENT/ACROMIOPLASTY;  Surgeon: [name], MD;  Location: [facility];  Service: Orthopedics;  Laterality: Left;  ANESTHESIA: GENERAL, PRE/POST OP SCALENE  . TEE WITHOUT CARDIOVERSION N/A 03/03/2015   Procedure: TRANSESOPHAGEAL ECHOCARDIOGRAM (TEE);  Surgeon: [name], MD;  [facility];  Service: Cardiology;  Laterality: N/A;  . UMBILICAL HERNIA REPAIR     with other hernia repair with mesh  . WRIST SURGERY Right    fusion    There were no vitals filed for this visit.       Subjective Assessment - 05/24/16 1441    Subjective Patient states that he was diagnosed with another stroke early in January of 2018; he reports that the associated sickness/symptoms he was having when the stroke occurred really knocked him for a loop, everything seemed to be at a slant. No falls recently, just a couple of close calls recently however. He reports he has been really focusing on taking his time before he gets up, has been trying not to rush. He reports taking showers is hard due to balance, he also reports difficulty in pouring things out of 2L bottles and shaving. He does not use his cane to  walk around the house, but does hold onto things around him/makes sure something steady to catch himself with  is nearby just in case.    Pertinent History history of multiple CVAs, patent foramen ovale, A-fib, DM with neuropathy, anxiety and depression, vertigo    Patient Stated Goals "get better", get endurance back up   Currently in Pain? Yes   Pain Score 7    Pain Location Back   Pain Orientation Lower   Pain Descriptors / Indicators Aching;Discomfort   Pain Type Chronic pain   Pain Radiating Towards none    Pain Onset More than a month ago   Pain Frequency Constant   Aggravating Factors  getting up in the morning/being stiff, laying down    Pain Relieving  Factors sitting up    Effect of Pain on Daily Activities nothing, just annoying             [facility] PT Assessment - 05/24/16 0001      Assessment   Medical Diagnosis acute CVA   Referring Provider jindong xu    Onset Date/Surgical Date 05/03/16   Next MD Visit not sure    Prior Therapy PT in hospital acutely      Precautions   Precautions None     Restrictions   Weight Bearing Restrictions No     Balance Screen   Has the patient fallen in the past 6 months No   Has the patient had a decrease in activity level because of a fear of falling?  Yes   Is the patient reluctant to leave their home because of a fear of falling?  No     Prior Function   Level of Independence Independent   Vocation On disability   Leisure enjoys spending time with friends, working in the yard, staying active     Observation/Other Assessments   Observations mild possible ataxia L UE/LE; kinesthesia and proprioception appear intact     Strength   Right Hip Flexion 5/5   Right Hip ABduction 4+/5   Left Hip Flexion 5/5   Left Hip ABduction 4+/5   Right Knee Flexion 5/5   Right Knee Extension 5/5   Left Knee Flexion 4+/5   Left Knee Extension 4+/5   Right Ankle Dorsiflexion 5/5   Left Ankle Dorsiflexion 5/5     Ambulation/Gait   Gait Comments wide BOS, proximal weakness, mild possible ataxia/bradykinesia noted      Berg Balance Test   Sit to Stand Able to stand without using hands and stabilize independently   Standing Unsupported Able to stand safely 2 minutes   Sitting with Back Unsupported but Feet Supported on Floor or Stool Able to sit safely and securely 2 minutes   Stand to Sit Sits safely with minimal use of hands   Transfers Able to transfer safely, minor use of hands   Standing Unsupported with Eyes Closed Able to stand 10 seconds with supervision   Standing Ubsupported with Feet Together Able to place feet together independently and stand for 1 minute with supervision   From  Standing, Reach Forward with Outstretched Arm Can reach forward >12 cm safely (5")   From Standing Position, Pick up Object from Floor Able to pick up shoe, needs supervision   From Standing Position, Turn to Look Behind Over each Shoulder Looks behind from both sides and weight shifts well   Turn 360 Degrees Able to turn 360 degrees safely but slowly   Standing Unsupported, Alternately Place Feet on Step/Stool  Able to stand independently and complete 8 steps >20 seconds   Standing Unsupported, One Foot in [employer] balance while stepping or standing   Standing on One Leg Able to lift leg independently and hold equal to or more than 3 seconds   Total Score 43                           PT Education - 05/24/16 1705    Education provided Yes   Education Details prognosis, POC   Person(s) Educated Patient   Methods Explanation   Comprehension Verbalized understanding          PT Short Term Goals - 05/24/16 1713      PT SHORT TERM GOAL #1   Title Patient to be able to list 5/5 safety precautions in order to show improved safety awareness and reduced risk of fall    Time 2   Period Weeks   Status New     PT SHORT TERM GOAL #2   Title Patient to be able to state the importance of regularly using appropriate assistive device in order to prevent fall in order to show improved safety awareness/reduced fall risk    Time 2   Period Weeks   Status New     PT SHORT TERM GOAL #3   Title Patient to be independent in correctly and consistently performing appropriate HEP, to be progressed PRN    Time 1   Period Weeks   Status New           PT Long Term Goals - 05/24/16 1714      PT LONG TERM GOAL #1   Title Patient to demonstrate functional strength 5/5 in order to improve balance and mobility    Time 4   Period Weeks   Status New     PT LONG TERM GOAL #2   Title Patient to score at least 49 on BERG balance test in order to show improved general safety and  reduced fall risk    Time 4   Period Weeks   Status New     PT LONG TERM GOAL #3   Title Patient to score no less than 19 on DGI in order to show balance adequate enough to assist in preventing falls in dynamic situations   Time 4   Period Weeks   Status New               Plan - 05/24/16 1711    Clinical Impression Statement Patient arrives after having sustained an acute CVA earlier this month; patient very pleasant but very talkative today, limiting extent of PT examination and unable to provide HEP due to time constraints at initial evaluation. Examination reveals fairly mild functional muscle weakness, significant unsteadiness, and gait deviation. Unsure what patient's baseline BERG score would be as this does not appear to have been measured in prior courses of care, however current BERG indicates that patient is quite the fall risk at this time. Recommend skilled PT services in order to address functional limitations, reduce fall risk, and optimize overall level of function.    Rehab Potential Good   Clinical Impairments Affecting Rehab Potential (+) pleasant and appears motivated to participate in skilled PT services; (-) multiple co-morbidities including history of multiple CVAs    PT Frequency 2x / week   PT Duration 4 weeks   PT Treatment/Interventions ADLs/Self Care Home Management;Biofeedback;DME Instruction;Gait training;Stair training;Functional mobility training;Therapeutic activities;Therapeutic exercise;Balance  training;Neuromuscular re-education;Patient/family education;Manual techniques;Energy conservation;Taping   PT Next Visit Plan review initial eval and goals; primary focus is on BALANCE. Need to give basic balance HEP as well.    PT Home Exercise Plan not given due to time constraints, give at second session    Consulted and Agree with Plan of Care Patient      Patient will benefit from skilled therapeutic intervention in order to improve the following  deficits and impairments:  Abnormal gait, Improper body mechanics, Decreased coordination, Decreased activity tolerance, Decreased strength, Decreased balance, Difficulty walking, Decreased safety awareness  Visit Diagnosis: Unsteadiness on feet - Plan: PT plan of care cert/re-cert  Other abnormalities of gait and mobility - Plan: PT plan of care cert/re-cert  Muscle weakness (generalized) - Plan: PT plan of care cert/re-cert     Problem List Patient Active Problem List   Diagnosis Date Noted  . Generalized anxiety disorder 05/15/2016  . Acute bronchiolitis due to respiratory syncytial virus (RSV)   . Acute CVA (cerebrovascular accident) ([place]) 05/04/2016  . Dizziness 05/02/2016  . Anxiety and depression 05/02/2016  . Paroxysmal atrial fibrillation ([place]) 03/02/2016  . PFO (patent foramen ovale) 07/24/2015  . Chronic deep vein thrombosis (DVT) of popliteal vein of left lower extremity ([place]) 07/24/2015  . OSA (obstructive sleep apnea) 04/18/2015  . 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 artery ([place]) 04/18/2015  . Cerebellar stroke ([place]) 02/28/2015  . Snoring 01/23/2015  . Degeneration of cervical intervertebral disc 01/23/2015  . Essential hypertension 12/14/2014  . Diabetes mellitus type II, controlled ([place]) 12/14/2014  . HLD (hyperlipidemia) 12/14/2014  . Neck pain 12/14/2014  . Cerebral infarction, chronic   . Vertigo 10/14/2014  . 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 Impingement syndrome of left shoulder 08/02/2014  . 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 Abdominal pain 06/13/2014  . Rectal bleeding 03/13/2014  . Diabetes type 2, controlled ([place]) 02/18/2014  . Diabetic peripheral neuropathy ([place]) 03/28/2013  . Irreducible incisional hernia 11/11/2010    Deniece Ree PT, DPT [facility] [address] [place], [place], [address] Phone: [phone]   Fax:  [phone]  Name: RONNI DERKSEN MRN: [id number] Date of Birth: Jun 03, 1960

## 2016-05-24 NOTE — Telephone Encounter (Signed)
Script sent to pharm. Pt notified on voicemail.

## 2016-05-24 NOTE — Telephone Encounter (Signed)
Pt is requesting a refill on his ALPRAZolam (XANAX) 0.25 MG tablet Wife states that he has been more anxious since this last stroke and has been putting them in with his morning and evening meds every day. Pt will be out before he is scheduled to return.    Travis [name]

## 2016-05-24 NOTE — Therapy (Signed)
[place], [place], [address] Phone: [phone]   Fax:  [phone]  Speech Language Pathology Treatment  Patient Details  Name: Travis [name] MRN: [id number] Date of Birth: 1960-12-22 Referring Provider: Rosalin Hawking  Encounter Date: 05/24/2016      End of Session - 05/24/16 1612    Visit Number 2   Number of Visits 8   Date for SLP Re-Evaluation 06/17/16   Authorization Type [facility]   Authorization Time Period 05/17/2016-06/17/2016   SLP Start Time 1515   SLP Stop Time  1602   SLP Time Calculation (min) 47 min   Activity Tolerance Patient tolerated treatment well      Past Medical History:  Diagnosis Date  . A-fib ([place]) 02/2016   found on loop recorder  . 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 Anxiety   . Arthritis   . Blood transfusion without reported diagnosis   . Chronic headaches   . Complication of anesthesia    bleed after last shoulder-had to stay overnight  . Depression   . Diabetes mellitus   . Diabetic peripheral neuropathy ([place]) 03/28/2013  . Diverticulosis   . Dizziness   . Hernia, inguinal   . Hyperlipidemia   . Hypertension   . Impingement syndrome of left shoulder 08/02/2014  . 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 Neuropathy of lower extremity   . Night sweats    every once in a while  . Sleep apnea    no CPAP  . Stroke ([place]) 02/2015  . Syncope and collapse     Past Surgical History:  Procedure Laterality Date  . CERVICAL DISC SURGERY    . COLON SURGERY  2003   1/3 removed for diverticulitis  . COLONOSCOPY    . EP IMPLANTABLE DEVICE N/A 05/01/2015   Procedure: Loop Recorder Insertion;  Surgeon: [name], MD;  Location: [facility];  Service: Cardiovascular;  Laterality: N/A;  . INGUINAL HERNIA REPAIR Bilateral   . KNEE ARTHROSCOPY Right   . SHOULDER ARTHROSCOPY Right    1  . SHOULDER ARTHROSCOPY Left 08/02/2014   Procedure: LEFT SHOULDER SCOPE  DEBRIDEMENT/ACROMIOPLASTY;  Surgeon: [name], MD;  Location: [facility];  Service: Orthopedics;  Laterality: Left;  ANESTHESIA: GENERAL, PRE/POST OP SCALENE  . TEE WITHOUT CARDIOVERSION N/A 03/03/2015   Procedure: TRANSESOPHAGEAL ECHOCARDIOGRAM (TEE);  Surgeon: [name], MD;  [facility];  Service: Cardiology;  Laterality: N/A;  . UMBILICAL HERNIA REPAIR     with other hernia repair with mesh  . WRIST SURGERY Right    fusion    There were no vitals filed for this visit.      Subjective Assessment - 05/24/16 1607    Subjective "my mind goes faster than my words"    Currently in Pain? Yes   Pain Score 7    Pain Location Back   Pain Orientation Lower   Pain Descriptors / Indicators Aching;Discomfort   Pain Onset More than a month ago   Pain Frequency Constant   Aggravating Factors  getting up, moving, being stiff    Pain Relieving Factors sitting up   Effect of Pain on Daily Activities just annoying   Multiple Pain Sites Yes  yes, throbbing, head, pain 5/10, sarted today                ADULT SLP TREATMENT - 05/24/16 0001      General Information  Behavior/Cognition Cooperative;Pleasant mood   Patient Positioning Upright in bed   HPI [name] is a 56 yo male who was referred for SLE by his neurologist, Dr. Erlinda Hong. [name] is known to this SLP from previous cognitive linguistic therapy following a stroke. He was admitted to [facility] on 05/02/16 due to dizziness, leaning towards left, slurry speech, left arm numbnessand generalized weakness. MRI showed left dorsal medulla small infarct. CTA head and neck, CUS and TTE unremarkable. LDL 60 and A1C 5.4. He had cough and found to have RSV URI. He was continued on eliquis and lipitor on discharge. Since discharge, pt has been doing better. Stated that speech much clearer but slower, still has left forearm bandlike numbness and tightness feeling, more around wrist.  Motor back to baseline, still walk with cane for safety. BP 119/78. Wife still complains of s/s of OSA, pt was diagnosed with OSA but refused CPAP. May consider nasal pillow, he will follow up with PCP next week to arrange.     Treatment Provided   Treatment provided Cognitive-Linquistic     Pain Assessment   Pain Assessment 0-10   Pain Score 7    Pain Location head pain score of 5, lower back pain score of 7    Pain Descriptors / Indicators Throbbing (head) ;aching, Discomfort(back)   Pain Intervention(s) Repositioned;Other (comment)  (sitting up)     Cognitive-Linquistic Treatment   Treatment focused on Cognition;Dysarthria   Skilled Treatment education regarding use of compensatory strategies      Assessment / Recommendations / Plan   Plan Continue with current plan of care     Dysphagia Recommendations   Liquids provided via --  sipped soda from can, no coughing/choking      Progression Toward Goals   Progression toward goals Progressing toward goals          SLP Education - 05/24/16 1610    Education provided Yes   Education Details educated patient about strategies to make gains with speech intelligibility    Person(s) Educated Patient   Methods Demonstration   Comprehension Verbalized understanding;Returned demonstration;Need further instruction          SLP Short Term Goals - 05/24/16 1629      SLP SHORT TERM GOAL #1   Title Pt will implement memory strategies in functional tasks for 90% acc with min assist   Baseline 2/5 recall of words without cues   Time 4   Period Weeks   Status On-going     SLP SHORT TERM GOAL #2   Title Pt will implement speech intelligibility strategies during conversational speech to increase naturalness and accuracy with 95% accuracy as judged by SLP in 1:1 conversations.   Baseline Pt with hesitations, repetitions, revisions of whole words in conversation 20% of the time.   Time 4   Period Weeks   Status On-going     SLP  SHORT TERM GOAL #3   Title Pt will verbalize at least 4 compensatory strategies for speech intelligibility and memory with indirect cues.   Baseline Pt verbalizes 2 with cues   Time 4   Period Weeks   Status On-going          SLP Long Term Goals - 05/24/16 1629      SLP LONG TERM GOAL #1   Title Same as short term          Plan - 05/24/16 1613    Clinical Impression Statement Mr. Wiltsie was pleasant throughout  visit today. Targeted speech intelligibility and short term memory during session.  He independently listed 3 techniques for improving speech intelligibility (slow rate, pacing, over-articulation); however, he demonstrated difficulty using techniques throughout session. He used over-articulation and pacing techniques with min verbal cues from SLP while reading a story aloud - noted minimal part and whole word repetitions while reading. Engaged pt in structured conversation and he produced more part and whole word repetitions; however, this decreased with verbal cues to slow rate of speech. Speech intelligibility was 80% today during conversation. He recalled details from story with accuracy in 7/10 trials with SLP questioning. He demonstrated strong awareness of topic maintenance today and acknowledged when he shifted topics unexpectedly; however, he required min verbal cues to fully redirect to previous topic of conversation. ST recommending continued intervention to target deficits.    Speech Therapy Frequency 2x / week   Duration 4 weeks   Treatment/Interventions SLP instruction and feedback;Compensatory strategies;Patient/family education   Potential to Achieve Goals Good   Potential Considerations Previous level of function   SLP Home Exercise Plan Pt will be independent with HEP as assigned to facilitate carryover of treatment strategies and techniques in home environment.   Consulted and Agree with Plan of Care Patient      Patient will benefit from skilled therapeutic  intervention in order to improve the following deficits and impairments:   Cognitive communication deficit  Dysarthria    Problem List Patient Active Problem List   Diagnosis Date Noted  . Generalized anxiety disorder 05/15/2016  . Acute bronchiolitis due to respiratory syncytial virus (RSV)   . Acute CVA (cerebrovascular accident) ([place]) 05/04/2016  . Dizziness 05/02/2016  . Anxiety and depression 05/02/2016  . Paroxysmal atrial fibrillation ([place]) 03/02/2016  . PFO (patent foramen ovale) 07/24/2015  . Chronic deep vein thrombosis (DVT) of popliteal vein of left lower extremity ([place]) 07/24/2015  . OSA (obstructive sleep apnea) 04/18/2015  . 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 artery ([place]) 04/18/2015  . Cerebellar stroke ([place]) 02/28/2015  . Snoring 01/23/2015  . Degeneration of cervical intervertebral disc 01/23/2015  . Essential hypertension 12/14/2014  . Diabetes mellitus type II, controlled ([place]) 12/14/2014  . HLD (hyperlipidemia) 12/14/2014  . Neck pain 12/14/2014  . Cerebral infarction, chronic   . Vertigo 10/14/2014  . 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 Impingement syndrome of left shoulder 08/02/2014  . 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 Abdominal pain 06/13/2014  . Rectal bleeding 03/13/2014  . Diabetes type 2, controlled ([place]) 02/18/2014  . Diabetic peripheral neuropathy ([place]) 03/28/2013  . Irreducible incisional hernia 11/11/2010   Thank you,   Renato Gails. Megan Salon [place], CCC-SLP  Speech-Language Pathologist [phone]      Luther Redo 05/24/2016, 4:55 PM  [facility] [address] [place], [place], [address] Phone: [phone]   Fax:  [phone]   Name: Travis [name] MRN: [id number] Date of Birth: 1960/11/20

## 2016-05-24 NOTE — Telephone Encounter (Signed)
May use Xanax 0.25 mg 1 twice a day when necessary #60 with 1 refill

## 2016-05-25 ENCOUNTER — Encounter (HOSPITAL_COMMUNITY): Payer: 59 | Admitting: Speech Pathology

## 2016-05-25 ENCOUNTER — Ambulatory Visit (INDEPENDENT_AMBULATORY_CARE_PROVIDER_SITE_OTHER): Payer: 59 | Admitting: *Deleted

## 2016-05-25 DIAGNOSIS — I63443 Cerebral infarction due to embolism of bilateral cerebellar arteries: Secondary | ICD-10-CM | POA: Diagnosis not present

## 2016-05-26 NOTE — Progress Notes (Signed)
[facility] Summary Report / Loop Recorder 

## 2016-05-27 ENCOUNTER — Ambulatory Visit (HOSPITAL_COMMUNITY): Payer: 59 | Admitting: Speech Pathology

## 2016-05-27 ENCOUNTER — Ambulatory Visit (HOSPITAL_COMMUNITY): Payer: 59 | Admitting: Occupational Therapy

## 2016-05-27 ENCOUNTER — Ambulatory Visit (HOSPITAL_COMMUNITY): Payer: 59 | Admitting: Physical Therapy

## 2016-05-27 ENCOUNTER — Encounter (HOSPITAL_COMMUNITY): Payer: Self-pay | Admitting: Occupational Therapy

## 2016-05-27 DIAGNOSIS — R29898 Other symptoms and signs involving the musculoskeletal system: Secondary | ICD-10-CM | POA: Diagnosis not present

## 2016-05-27 DIAGNOSIS — R41841 Cognitive communication deficit: Secondary | ICD-10-CM

## 2016-05-27 DIAGNOSIS — R471 Dysarthria and anarthria: Secondary | ICD-10-CM

## 2016-05-27 DIAGNOSIS — M6281 Muscle weakness (generalized): Secondary | ICD-10-CM | POA: Diagnosis not present

## 2016-05-27 DIAGNOSIS — R278 Other lack of coordination: Secondary | ICD-10-CM | POA: Diagnosis not present

## 2016-05-27 DIAGNOSIS — R2681 Unsteadiness on feet: Secondary | ICD-10-CM

## 2016-05-27 DIAGNOSIS — R2689 Other abnormalities of gait and mobility: Secondary | ICD-10-CM

## 2016-05-27 DIAGNOSIS — I69822 Dysarthria following other cerebrovascular disease: Secondary | ICD-10-CM | POA: Diagnosis not present

## 2016-05-27 NOTE — Patient Instructions (Signed)
   Tandem Stance  Standing at your counter top, place right heel to left toes.  Use the counter top for safey/balance if needed.  Hold for 15 seconds and then switch sides.  Repeat 3 times each side, twice a day.     SINGLE LEG STANCE - SLS  Stand on one leg and maintain your balance. Do this exercise at the kitchen counter.  Try to hold for 10-15 seconds, then switch sides.  Repeat 3 times each side, twice a day.     STANDING MARCHING  While standing, draw up your knee, set it down and then alternate to your other side. Do this exercise at the kitchen counter.   Use your arms for support if needed for balance and safety.   Repeat 5 times each leg (10 total), once a day.

## 2016-05-27 NOTE — Therapy (Signed)
[place], [place], [address] Phone: [phone]   Fax:  [phone]  Physical Therapy Treatment  Patient Details  Name: Travis [name] MRN: [id number] Date of Birth: 05/21/1960 Referring Provider: [name] xu   Encounter Date: 05/27/2016      PT End of Session - 05/27/16 1743    Visit Number 2   Number of Visits 8   Date for PT Re-Evaluation 06/21/16   Authorization Type [name]    Authorization Time Period 05/24/16 to 06/24/16   PT Start Time 1603   PT Stop Time 1645   PT Time Calculation (min) 42 min   Equipment Utilized During Treatment Gait belt   Activity Tolerance Patient tolerated treatment well   Behavior During Therapy [facility] for tasks assessed/performed      Past Medical History:  Diagnosis Date  . A-fib ([place]) 02/2016   found on loop recorder  . 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 Anxiety   . Arthritis   . Blood transfusion without reported diagnosis   . Chronic headaches   . Complication of anesthesia    bleed after last shoulder-had to stay overnight  . Depression   . Diabetes mellitus   . Diabetic peripheral neuropathy ([place]) 03/28/2013  . Diverticulosis   . Dizziness   . Hernia, inguinal   . Hyperlipidemia   . Hypertension   . Impingement syndrome of left shoulder 08/02/2014  . 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 Neuropathy of lower extremity   . Night sweats    every once in a while  . Sleep apnea    no CPAP  . Stroke ([place]) 02/2015  . Syncope and collapse     Past Surgical History:  Procedure Laterality Date  . CERVICAL DISC SURGERY    . COLON SURGERY  2003   1/3 removed for diverticulitis  . COLONOSCOPY    . EP IMPLANTABLE DEVICE N/A 05/01/2015   Procedure: Loop Recorder Insertion;  Surgeon: [name], MD;  Location: [facility];  Service: Cardiovascular;  Laterality: N/A;  . INGUINAL HERNIA REPAIR Bilateral   . KNEE ARTHROSCOPY Right   .  SHOULDER ARTHROSCOPY Right    1  . SHOULDER ARTHROSCOPY Left 08/02/2014   Procedure: LEFT SHOULDER SCOPE DEBRIDEMENT/ACROMIOPLASTY;  Surgeon: [name], MD;  Location: [facility];  Service: Orthopedics;  Laterality: Left;  ANESTHESIA: GENERAL, PRE/POST OP SCALENE  . TEE WITHOUT CARDIOVERSION N/A 03/03/2015   Procedure: TRANSESOPHAGEAL ECHOCARDIOGRAM (TEE);  Surgeon: [name], MD;  [facility];  Service: Cardiology;  Laterality: N/A;  . UMBILICAL HERNIA REPAIR     with other hernia repair with mesh  . WRIST SURGERY Right    fusion    There were no vitals filed for this visit.      Subjective Assessment - 05/27/16 1738    Subjective Patient arrives today very pleasant and reports no major changes since last session    Pertinent History history of multiple CVAs, patent foramen ovale, A-fib, DM with neuropathy, anxiety and depression, vertigo    Patient Stated Goals "get better", get endurance back up   Currently in Pain? No/denies            [facility] PT Assessment - 05/27/16 1742      Dynamic Gait Index   Level Surface Mild Impairment   Change in Gait Speed Mild Impairment   Gait with Horizontal Head Turns Mild Impairment   Gait  with Vertical Head Turns Mild Impairment   Gait and Pivot Turn Mild Impairment   Step Over Obstacle Mild Impairment   Step Around Obstacles Moderate Impairment   Steps Mild Impairment   Total Score 15   DGI comment: without [facility]                           Balance Exercises - 05/27/16 1740      Balance Exercises: Standing   Standing Eyes Closed Narrow base of support (BOS);Foam/compliant surface;1 rep;Other (comment)  x2-3 minutes, drift/LOB to the L    Gait with Head Turns Forward;Other (comment)  DGI activity- look up down, R, L   Step Over Hurdles / Cones small and large hurdles forwards and sideways    Other Standing Exercises anterior and posterior stepping strategy with min guard; cone  rotation taps cross-midline            PT Education - 05/27/16 1743    Education provided Yes   Education Details reviewed initial eval/goals; HEP    Person(s) Educated Patient   Methods Explanation;Handout   Comprehension Verbalized understanding;Need further instruction          PT Short Term Goals - 05/24/16 1713      PT SHORT TERM GOAL #1   Title Patient to be able to list 5/5 safety precautions in order to show improved safety awareness and reduced risk of fall    Time 2   Period Weeks   Status New     PT SHORT TERM GOAL #2   Title Patient to be able to state the importance of regularly using appropriate assistive device in order to prevent fall in order to show improved safety awareness/reduced fall risk    Time 2   Period Weeks   Status New     PT SHORT TERM GOAL #3   Title Patient to be independent in correctly and consistently performing appropriate HEP, to be progressed PRN    Time 1   Period Weeks   Status New           PT Long Term Goals - 05/24/16 1714      PT LONG TERM GOAL #1   Title Patient to demonstrate functional strength 5/5 in order to improve balance and mobility    Time 4   Period Weeks   Status New     PT LONG TERM GOAL #2   Title Patient to score at least 49 on BERG balance test in order to show improved general safety and reduced fall risk    Time 4   Period Weeks   Status New     PT LONG TERM GOAL #3   Title Patient to score no less than 19 on DGI in order to show balance adequate enough to assist in preventing falls in dynamic situations   Time 4   Period Weeks   Status New               Plan - 05/27/16 1744    Clinical Impression Statement Reviewed initial eval and goals at beginning of session, with quick disclosure completed. Focused primarily on balance today, with patient initially having some difficulty with each balance task but ease of exercises/activities improved with repetition/practice. Min guard provided  throughout session, patient able to mostly self-correct balance throughout session today however. Assigned basic balance HEP at end of session today.    Rehab Potential Good   Clinical Impairments  Affecting Rehab Potential (+) pleasant and appears motivated to participate in skilled PT services; (-) multiple co-morbidities including history of multiple CVAs    PT Frequency 2x / week   PT Duration 4 weeks   PT Treatment/Interventions ADLs/Self Care Home Management;Biofeedback;DME Instruction;Gait training;Stair training;Functional mobility training;Therapeutic activities;Therapeutic exercise;Balance training;Neuromuscular re-education;Patient/family education;Manual techniques;Energy conservation;Taping   PT Next Visit Plan Primary focus is on BALANCE. Increase difficulty of tasks next session by adding more foam based tasks.    PT Home Exercise Plan 1/25: SLS, tandem stance, standing marches    Consulted and Agree with Plan of Care Patient      Patient will benefit from skilled therapeutic intervention in order to improve the following deficits and impairments:  Abnormal gait, Improper body mechanics, Decreased coordination, Decreased activity tolerance, Decreased strength, Decreased balance, Difficulty walking, Decreased safety awareness  Visit Diagnosis: Unsteadiness on feet  Other abnormalities of gait and mobility  Muscle weakness (generalized)     Problem List Patient Active Problem List   Diagnosis Date Noted  . Generalized anxiety disorder 05/15/2016  . Acute bronchiolitis due to respiratory syncytial virus (RSV)   . Acute CVA (cerebrovascular accident) ([place]) 05/04/2016  . Dizziness 05/02/2016  . Anxiety and depression 05/02/2016  . Paroxysmal atrial fibrillation ([place]) 03/02/2016  . PFO (patent foramen ovale) 07/24/2015  . Chronic deep vein thrombosis (DVT) of popliteal vein of left lower extremity ([place]) 07/24/2015  . OSA (obstructive sleep apnea) 04/18/2015  .  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 artery ([place]) 04/18/2015  . Cerebellar stroke ([place]) 02/28/2015  . Snoring 01/23/2015  . Degeneration of cervical intervertebral disc 01/23/2015  . Essential hypertension 12/14/2014  . Diabetes mellitus type II, controlled ([place]) 12/14/2014  . HLD (hyperlipidemia) 12/14/2014  . Neck pain 12/14/2014  . Cerebral infarction, chronic   . Vertigo 10/14/2014  . 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 Impingement syndrome of left shoulder 08/02/2014  . 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 Abdominal pain 06/13/2014  . Rectal bleeding 03/13/2014  . Diabetes type 2, controlled ([place]) 02/18/2014  . Diabetic peripheral neuropathy ([place]) 03/28/2013  . Irreducible incisional hernia 11/11/2010    Deniece Ree PT, DPT [facility] [address] [place], [place], [address] Phone: [phone]   Fax:  [phone]  Name: Travis [name] MRN: [id number] Date of Birth: 1960/12/10

## 2016-05-27 NOTE — Therapy (Signed)
[place], [place], [address] Phone: [phone]   Fax:  [phone]  Speech Language Pathology Treatment  Patient Details  Name: Travis [name] MRN: [id number] Date of Birth: 10-Apr-1961 Referring Provider: Rosalin Hawking  Encounter Date: 05/27/2016      End of Session - 05/27/16 1527    Visit Number 3   Number of Visits 8   Date for SLP Re-Evaluation 06/17/16   Authorization Type [facility]   Authorization Time Period 05/17/2016-06/17/2016   SLP Start Time 1433   SLP Stop Time  1516   SLP Time Calculation (min) 43 min   Activity Tolerance Patient tolerated treatment well      Past Medical History:  Diagnosis Date  . A-fib ([place]) 02/2016   found on loop recorder  . 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 Anxiety   . Arthritis   . Blood transfusion without reported diagnosis   . Chronic headaches   . Complication of anesthesia    bleed after last shoulder-had to stay overnight  . Depression   . Diabetes mellitus   . Diabetic peripheral neuropathy ([place]) 03/28/2013  . Diverticulosis   . Dizziness   . Hernia, inguinal   . Hyperlipidemia   . Hypertension   . Impingement syndrome of left shoulder 08/02/2014  . 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 Neuropathy of lower extremity   . Night sweats    every once in a while  . Sleep apnea    no CPAP  . Stroke ([place]) 02/2015  . Syncope and collapse     Past Surgical History:  Procedure Laterality Date  . CERVICAL DISC SURGERY    . COLON SURGERY  2003   1/3 removed for diverticulitis  . COLONOSCOPY    . EP IMPLANTABLE DEVICE N/A 05/01/2015   Procedure: Loop Recorder Insertion;  Surgeon: [name], MD;  Location: [facility];  Service: Cardiovascular;  Laterality: N/A;  . INGUINAL HERNIA REPAIR Bilateral   . KNEE ARTHROSCOPY Right   . SHOULDER ARTHROSCOPY Right    1  . SHOULDER ARTHROSCOPY Left 08/02/2014   Procedure: LEFT SHOULDER SCOPE  DEBRIDEMENT/ACROMIOPLASTY;  Surgeon: [name], MD;  Location: [facility];  Service: Orthopedics;  Laterality: Left;  ANESTHESIA: GENERAL, PRE/POST OP SCALENE  . TEE WITHOUT CARDIOVERSION N/A 03/03/2015   Procedure: TRANSESOPHAGEAL ECHOCARDIOGRAM (TEE);  Surgeon: [name], MD;  [facility];  Service: Cardiology;  Laterality: N/A;  . UMBILICAL HERNIA REPAIR     with other hernia repair with mesh  . WRIST SURGERY Right    fusion    There were no vitals filed for this visit.      Subjective Assessment - 05/27/16 1515    Subjective "I get fatigued."   Currently in Pain? No/denies               ADULT SLP TREATMENT - 05/27/16 1515      General Information   Behavior/Cognition Cooperative;Pleasant mood   Patient Positioning Upright in chair   Oral care provided N/A   HPI Travis [name] is a 56 yo male who was referred for SLE by his neurologist, Dr. Erlinda Hong. Travis [name] is known to this SLP from previous cognitive linguistic therapy following a stroke. He was admitted to [facility] on 05/02/16 due to dizziness, leaning towards left, slurry speech, left arm numbnessand generalized weakness. MRI showed left dorsal medulla small infarct. CTA head and neck,  CUS and TTE unremarkable. LDL 60 and A1C 5.4. He had cough and found to have RSV URI. He was continued on eliquis and lipitor on discharge. Since discharge, pt has been doing better. Stated that speech much clearer but slower, still has left forearm bandlike numbness and tightness feeling, more around wrist. Motor back to baseline, still walk with cane for safety. BP 119/78. Wife still complains of s/s of OSA, pt was diagnosed with OSA but refused CPAP. May consider nasal pillow, he will follow up with PCP next week to arrange.     Treatment Provided   Treatment provided Cognitive-Linquistic     Cognitive-Linquistic Treatment   Treatment focused on Cognition;Dysarthria   Skilled  Treatment SLP feedback, implementation of compensatory strategies, Pt self evaluation     Assessment / Recommendations / Plan   Plan Continue with current plan of care     Progression Toward Goals   Progression toward goals Progressing toward goals            SLP Short Term Goals - 05/27/16 1536      SLP SHORT TERM GOAL #1   Title Pt will implement memory strategies in functional tasks for 90% acc with min assist   Baseline 2/5 recall of words without cues   Time 4   Period Weeks   Status On-going     SLP SHORT TERM GOAL #2   Title Pt will implement speech intelligibility strategies during conversational speech to increase naturalness and accuracy with 95% accuracy as judged by SLP in 1:1 conversations.   Baseline Pt with hesitations, repetitions, revisions of whole words in conversation 20% of the time.   Time 4   Period Weeks   Status On-going     SLP SHORT TERM GOAL #3   Title Pt will verbalize at least 4 compensatory strategies for speech intelligibility and memory with indirect cues.   Baseline Pt verbalizes 2 with cues   Time 4   Period Weeks   Status On-going          SLP Long Term Goals - 05/27/16 1536      SLP LONG TERM GOAL #1   Title Same as short term          Plan - 05/27/16 1529    Clinical Impression Statement Mr. Enter was pleasant and engaged throughout SLP session this date. He was reminded about SLP goals targeting speech intelligibility and memory. Pt reports frustration with fatigue levels related to physical endurance. Pt independently recalled speech intelligibility strategies, but stated that he had to pretend like he was "reading to a three year old". SLP encouraged pacing and over articulation strategies, while still attempting to maintain "naturalness of speech". Continued part word and whole word repetitions heard in conversation. Part word repetitions decreased when Travis [name] decreased overall rate of speech, however whole word  repetitions remained at times. Memory goals were targeted by SLP reading functional paragraph information (medication management directions, etc). Pt initially cued to take short notes while listening, however it became apparent that it was more efficient for Pt to listen only first and then take short notes. Pt expressed embarrassment over his writing and he was encouraged to practice at home. Pt is making good progress toward goals. Continue plan of care.    Speech Therapy Frequency 2x / week   Duration 4 weeks   Treatment/Interventions SLP instruction and feedback;Compensatory strategies;Patient/family education   Potential to Achieve Goals Good   Potential Considerations Previous level of function  SLP Home Exercise Plan Pt will be independent with HEP as assigned to facilitate carryover of treatment strategies and techniques in home environment.   Consulted and Agree with Plan of Care Patient      Patient will benefit from skilled therapeutic intervention in order to improve the following deficits and impairments:   Cognitive communication deficit  Dysarthria    Problem List Patient Active Problem List   Diagnosis Date Noted  . Generalized anxiety disorder 05/15/2016  . Acute bronchiolitis due to respiratory syncytial virus (RSV)   . Acute CVA (cerebrovascular accident) ([place]) 05/04/2016  . Dizziness 05/02/2016  . Anxiety and depression 05/02/2016  . Paroxysmal atrial fibrillation ([place]) 03/02/2016  . PFO (patent foramen ovale) 07/24/2015  . Chronic deep vein thrombosis (DVT) of popliteal vein of left lower extremity ([place]) 07/24/2015  . OSA (obstructive sleep apnea) 04/18/2015  . 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 artery ([place]) 04/18/2015  . Cerebellar stroke ([place]) 02/28/2015  . Snoring 01/23/2015  . Degeneration of cervical intervertebral disc 01/23/2015  . Essential hypertension 12/14/2014  . Diabetes mellitus type II, controlled ([place]) 12/14/2014  .  HLD (hyperlipidemia) 12/14/2014  . Neck pain 12/14/2014  . Cerebral infarction, chronic   . Vertigo 10/14/2014  . 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 Impingement syndrome of left shoulder 08/02/2014  . 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 Abdominal pain 06/13/2014  . Rectal bleeding 03/13/2014  . Diabetes type 2, controlled ([place]) 02/18/2014  . Diabetic peripheral neuropathy ([place]) 03/28/2013  . Irreducible incisional hernia 11/11/2010   Thank you,  Genene Churn, [place]  [facility] 05/27/2016, 3:37 PM  [place] [address] [place], [place], [address] Phone: [phone]   Fax:  [phone]   Name: CHRISTY ARQUILLA MRN: [id number] Date of Birth: 1960/12/30

## 2016-05-27 NOTE — Therapy (Signed)
[place], [place], [address] Phone: [phone]   Fax:  [phone]  Occupational Therapy Treatment  Patient Details  Name: Travis [name] MRN: [id number] Date of Birth: [birth year] Referring Provider: Dr. Rosalin Hawking  Encounter Date: 05/27/2016      OT End of Session - 05/27/16 1603    Visit Number 2   Number of Visits 16   Date for OT Re-Evaluation 07/16/16   Authorization Type [name]    OT Start Time 1519   OT Stop Time 1600   OT Time Calculation (min) 41 min   Activity Tolerance Patient tolerated treatment well   Behavior During Therapy [facility] for tasks assessed/performed      Past Medical History:  Diagnosis Date  . A-fib ([place]) 02/2016   found on loop recorder  . 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 Anxiety   . Arthritis   . Blood transfusion without reported diagnosis   . Chronic headaches   . Complication of anesthesia    bleed after last shoulder-had to stay overnight  . Depression   . Diabetes mellitus   . Diabetic peripheral neuropathy ([place]) 03/28/2013  . Diverticulosis   . Dizziness   . Hernia, inguinal   . Hyperlipidemia   . Hypertension   . Impingement syndrome of left shoulder 08/02/2014  . 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 Neuropathy of lower extremity   . Night sweats    every once in a while  . Sleep apnea    no CPAP  . Stroke ([place]) 02/2015  . Syncope and collapse     Past Surgical History:  Procedure Laterality Date  . CERVICAL DISC SURGERY    . COLON SURGERY  2003   1/3 removed for diverticulitis  . COLONOSCOPY    . EP IMPLANTABLE DEVICE N/A 05/01/2015   Procedure: Loop Recorder Insertion;  Surgeon: [name], MD;  Location: [facility];  Service: Cardiovascular;  Laterality: N/A;  . INGUINAL HERNIA REPAIR Bilateral   . KNEE ARTHROSCOPY Right   . SHOULDER ARTHROSCOPY Right    1  . SHOULDER ARTHROSCOPY Left 08/02/2014   Procedure: LEFT  SHOULDER SCOPE DEBRIDEMENT/ACROMIOPLASTY;  Surgeon: [name], MD;  Location: [facility];  Service: Orthopedics;  Laterality: Left;  ANESTHESIA: GENERAL, PRE/POST OP SCALENE  . TEE WITHOUT CARDIOVERSION N/A 03/03/2015   Procedure: TRANSESOPHAGEAL ECHOCARDIOGRAM (TEE);  Surgeon: [name], MD;  [facility];  Service: Cardiology;  Laterality: N/A;  . UMBILICAL HERNIA REPAIR     with other hernia repair with mesh  . WRIST SURGERY Right    fusion    There were no vitals filed for this visit.      Subjective Assessment - 05/27/16 1505    Subjective  S: I've been doing that putty a lot.    Currently in Pain? No/denies            [facility] OT Assessment - 05/27/16 1504      Assessment   Diagnosis LUE Weakness S/P CVA     Precautions   Precautions None                  OT Treatments/Exercises (OP) - 05/27/16 1521      Exercises   Exercises Shoulder;Hand     Shoulder Exercises: Supine   Protraction Strengthening;12 reps   Protraction Weight (lbs) 1   Horizontal ABduction Strengthening;12 reps   Horizontal ABduction Weight (lbs)  1   External Rotation Strengthening;12 reps   External Rotation Weight (lbs) 1   External Rotation Limitations shoulder abducted   Internal Rotation Strengthening;12 reps   Internal Rotation Weight (lbs) 1   Internal Rotation Limitations shoulders abducted   Flexion Strengthening;12 reps   Shoulder Flexion Weight (lbs) 1   ABduction Strengthening;12 reps   Shoulder ABduction Weight (lbs) 1     Shoulder Exercises: Sidelying   External Rotation Strengthening;12 reps   External Rotation Weight (lbs) 1   Internal Rotation Strengthening;12 reps   Internal Rotation Weight (lbs) 1   Flexion Strengthening;12 reps   Flexion Weight (lbs) 1   ABduction Strengthening;12 reps   ABduction Weight (lbs) 1   Other Sidelying Exercises protraction, 12X, 1# weight   Other Sidelying Exercises horizontal abduction,  12X, 1# weight     Shoulder Exercises: ROM/Strengthening   X to V Arms 12X, 1# weight   Proximal Shoulder Strengthening, Supine 10X each, 1# weight, no rest breaks   Ball on Wall 1' flexion 1' abduction     Hand Exercises   Hand Gripper with Large Beads 6/6 beads gripper set at 38#   Hand Gripper with Medium Beads 13/13 beads gripper set at 38#   Hand Gripper with Small Beads 16/16 beads gripper set at 38#     Fine Motor Coordination   Fine Motor Coordination Picking up coins;Stacking coins   Picking up coins Pt picked up coins from tabletop using left thumb and index finger. Holding all coins in hand once picked up. Min difficulty   Stacking coins Pt held all coins in left hand, stacking one at a time on tabletop. Mod difficulty holding coins in hand while stacking, min difficulty stacking. Increased time required.                 OT Education - 05/27/16 1603    Education provided Yes   Education Details provided evaluation and reviewed   Person(s) Educated Patient   Methods Explanation;Handout   Comprehension Verbalized understanding          OT Short Term Goals - 05/27/16 1606      OT SHORT TERM GOAL #1   Title Pt will be educated on and independent in [place].    Time 4   Period Weeks   Status On-going     OT SHORT TERM GOAL #2   Title Patient will increase left upper extremity strength to 4/5 for improved ability to complete yard work.   Time 4   Period Weeks   Status On-going     OT SHORT TERM GOAL #3   Title Pt will increase LUE grip strength by 10# to increase ability to hold onto 2 liter drink bottles.    Time 4   Period Weeks   Status On-going     OT SHORT TERM GOAL #4   Title Patient will increase left hand pinch strength by 2 pounds for improved ability to manipulate household items.    Time 4   Period Weeks   Status On-going     OT SHORT TERM GOAL #5   Title Patient will increase sustained activity tolerance by 25% for improved ability to  complete daily activities.    Time 4   Period Weeks   Status On-going           OT Long Term Goals - 05/27/16 1607      OT LONG TERM GOAL #1   Title Patient will return to highest level  of independence with all daily and leisure tasks.    Time 8   Period Weeks   Status On-going     OT LONG TERM GOAL #2   Title Patient will demonstrate 4+/5 strength in his LUE for improved ability to particpate in yard work.   Time 8   Period Weeks   Status On-going     OT LONG TERM GOAL #3   Title Patient will improve left grip strength by 20# and pinch strength by 4# for improved ability to open containers and manipulate objects.    Time 8   Period Months   Status On-going     OT LONG TERM GOAL #4   Title Patient will improve sustained activity tolerance by 50% for increased independence with all daily activities.    Time 8   Period Weeks   Status On-going     OT LONG TERM GOAL #5   Title Patient will complete in hand manipulation activities without difficulty.    Time 8   Period Weeks   Status On-going     OT LONG TERM GOAL #6   Title Patient will be educated on energy conservation and work simplification principles and independently apply to daily tasks for improved safety.    Time 8   Period Weeks   Status On-going               Plan - 05/27/16 1604    Clinical Impression Statement A: Initiated LUE strengthening, grip strengthening, and fine motor coordination activities this session. Pt with min difficulty with exercises, intermittent rest breaks provided for fatigue. Pt required verbal cuing for form during exercises.    Plan P: Increase to 2# weights for LUE strengthening, increase gripper resistance. Add grooved pegboard working on in-hand manipulation with fine motor coordination   OT Home Exercise Plan 05/17/16:  proximal shoulder strengthening, theraputty for grip and pinch   Consulted and Agree with Plan of Care Patient      Patient will benefit from skilled  therapeutic intervention in order to improve the following deficits and impairments:  Decreased coordination, Decreased endurance, Decreased strength, Impaired UE functional use  Visit Diagnosis: Other symptoms and signs involving the musculoskeletal system  Other lack of coordination    Problem List Patient Active Problem List   Diagnosis Date Noted  . Generalized anxiety disorder 05/15/2016  . Acute bronchiolitis due to respiratory syncytial virus (RSV)   . Acute CVA (cerebrovascular accident) ([place]) 05/04/2016  . Dizziness 05/02/2016  . Anxiety and depression 05/02/2016  . Paroxysmal atrial fibrillation ([place]) 03/02/2016  . PFO (patent foramen ovale) 07/24/2015  . Chronic deep vein thrombosis (DVT) of popliteal vein of left lower extremity ([place]) 07/24/2015  . OSA (obstructive sleep apnea) 04/18/2015  . 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 artery ([place]) 04/18/2015  . Cerebellar stroke ([place]) 02/28/2015  . Snoring 01/23/2015  . Degeneration of cervical intervertebral disc 01/23/2015  . Essential hypertension 12/14/2014  . Diabetes mellitus type II, controlled ([place]) 12/14/2014  . HLD (hyperlipidemia) 12/14/2014  . Neck pain 12/14/2014  . Cerebral infarction, chronic   . Vertigo 10/14/2014  . 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 Impingement syndrome of left shoulder 08/02/2014  . 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 Abdominal pain 06/13/2014  . Rectal bleeding 03/13/2014  . Diabetes type 2, controlled ([place]) 02/18/2014  . Diabetic peripheral neuropathy ([place]) 03/28/2013  . Irreducible incisional hernia 11/11/2010   Guadelupe Sabin, OTR/L  [phone] 05/27/2016, 4:08 PM  [facility]  [place], [place], [address] Phone: [phone]   Fax:  [phone]  Name: EARLIE FREDERICKS MRN: [id number] Date of Birth: 01/29/1961

## 2016-05-28 MED FILL — LISINOPRIL 2.5 MG TABLET: 2.5 | 90 days supply | Qty: 90 | Fill #1

## 2016-05-28 MED FILL — ELIQUIS 5 MG TABLET: 5 | 30 days supply | Qty: 60 | Fill #1

## 2016-05-31 ENCOUNTER — Telehealth (HOSPITAL_COMMUNITY): Payer: Self-pay | Admitting: Speech Pathology

## 2016-05-31 ENCOUNTER — Ambulatory Visit (HOSPITAL_COMMUNITY): Payer: 59 | Admitting: Speech Pathology

## 2016-05-31 ENCOUNTER — Ambulatory Visit (HOSPITAL_COMMUNITY): Payer: 59

## 2016-05-31 NOTE — Telephone Encounter (Signed)
Telephone Call:  Voicemail left on Mr. Bultman's cell phone regarding missed OT and SLP appointments this date. No answer on home phone.  Thank you,  Genene Churn, CCC-SLP [phone]

## 2016-06-03 ENCOUNTER — Ambulatory Visit: Payer: 59 | Admitting: Family Medicine

## 2016-06-03 ENCOUNTER — Ambulatory Visit (HOSPITAL_COMMUNITY): Payer: 59

## 2016-06-03 ENCOUNTER — Ambulatory Visit (HOSPITAL_COMMUNITY): Payer: 59 | Admitting: Speech Pathology

## 2016-06-03 ENCOUNTER — Telehealth (HOSPITAL_COMMUNITY): Payer: Self-pay | Admitting: Family Medicine

## 2016-06-03 MED FILL — TOPIRAMATE 25 MG TABLET: 25 | 30 days supply | Qty: 120 | Fill #6

## 2016-06-03 MED FILL — ATORVASTATIN 20 MG TABLET: 20 | 90 days supply | Qty: 90 | Fill #1

## 2016-06-03 MED FILL — traMADol HCL 50 MG TABS: 50 | 5 days supply | Qty: 18 | Fill #0

## 2016-06-03 MED FILL — CLINDAMYCIN HCL 150 MG CAPS: 150 | 7 days supply | Qty: 30 | Fill #0

## 2016-06-03 NOTE — Telephone Encounter (Signed)
06/03/16 wife cx - she said that he was having problems with a tooth and a dentist in [place] has worked him in this afternoon and he will be going there

## 2016-06-07 ENCOUNTER — Ambulatory Visit: Payer: 59 | Admitting: Family Medicine

## 2016-06-08 ENCOUNTER — Ambulatory Visit (HOSPITAL_COMMUNITY): Payer: 59 | Admitting: Occupational Therapy

## 2016-06-08 ENCOUNTER — Ambulatory Visit (HOSPITAL_COMMUNITY): Payer: 59 | Attending: Neurology | Admitting: Speech Pathology

## 2016-06-08 ENCOUNTER — Ambulatory Visit (HOSPITAL_COMMUNITY): Payer: 59

## 2016-06-08 ENCOUNTER — Other Ambulatory Visit: Payer: Self-pay | Admitting: Internal Medicine

## 2016-06-08 ENCOUNTER — Encounter (HOSPITAL_COMMUNITY): Payer: Self-pay | Admitting: Speech Pathology

## 2016-06-08 ENCOUNTER — Ambulatory Visit (HOSPITAL_COMMUNITY): Payer: 59 | Admitting: Physical Therapy

## 2016-06-08 ENCOUNTER — Encounter (HOSPITAL_COMMUNITY): Payer: Self-pay | Admitting: Occupational Therapy

## 2016-06-08 DIAGNOSIS — M6281 Muscle weakness (generalized): Secondary | ICD-10-CM

## 2016-06-08 DIAGNOSIS — R29898 Other symptoms and signs involving the musculoskeletal system: Secondary | ICD-10-CM | POA: Diagnosis not present

## 2016-06-08 DIAGNOSIS — R41841 Cognitive communication deficit: Secondary | ICD-10-CM

## 2016-06-08 DIAGNOSIS — R471 Dysarthria and anarthria: Secondary | ICD-10-CM | POA: Diagnosis not present

## 2016-06-08 DIAGNOSIS — R2681 Unsteadiness on feet: Secondary | ICD-10-CM | POA: Insufficient documentation

## 2016-06-08 DIAGNOSIS — R278 Other lack of coordination: Secondary | ICD-10-CM | POA: Diagnosis not present

## 2016-06-08 DIAGNOSIS — R2689 Other abnormalities of gait and mobility: Secondary | ICD-10-CM | POA: Diagnosis not present

## 2016-06-08 NOTE — Therapy (Signed)
[facility] [place], [place], [address] Phone: [phone]   Fax:  [phone]  Speech Language Pathology Treatment  Patient Details  Name: Travis [name] MRN: [id number] Date of Birth: 06-Apr-1961 Referring Provider: Rosalin Hawking  Encounter Date: 06/08/2016      End of Session - 06/08/16 2158    Visit Number 4   Number of Visits 8   Date for SLP Re-Evaluation 06/17/16   Authorization Type [facility]   Authorization Time Period 05/17/2016-06/17/2016   SLP Start Time 1436   SLP Stop Time  1518   SLP Time Calculation (min) 42 min   Activity Tolerance Patient tolerated treatment well      Past Medical History:  Diagnosis Date  . A-fib ([place]) 02/2016   found on loop recorder  . 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 Anxiety   . Arthritis   . Blood transfusion without reported diagnosis   . Chronic headaches   . Complication of anesthesia    bleed after last shoulder-had to stay overnight  . Depression   . Diabetes mellitus   . Diabetic peripheral neuropathy ([place]) 03/28/2013  . Diverticulosis   . Dizziness   . Hernia, inguinal   . Hyperlipidemia   . Hypertension   . Impingement syndrome of left shoulder 08/02/2014  . 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 Neuropathy of lower extremity   . Night sweats    every once in a while  . Sleep apnea    no CPAP  . Stroke ([place]) 02/2015  . Syncope and collapse     Past Surgical History:  Procedure Laterality Date  . CERVICAL DISC SURGERY    . COLON SURGERY  2003   1/3 removed for diverticulitis  . COLONOSCOPY    . EP IMPLANTABLE DEVICE N/A 05/01/2015   Procedure: Loop Recorder Insertion;  Surgeon: [name], MD;  Location: [facility];  Service: Cardiovascular;  Laterality: N/A;  . INGUINAL HERNIA REPAIR Bilateral   . KNEE ARTHROSCOPY Right   . SHOULDER ARTHROSCOPY Right    1  . SHOULDER ARTHROSCOPY Left 08/02/2014   Procedure: LEFT SHOULDER SCOPE  DEBRIDEMENT/ACROMIOPLASTY;  Surgeon: [name], MD;  Location: [facility];  Service: Orthopedics;  Laterality: Left;  ANESTHESIA: GENERAL, PRE/POST OP SCALENE  . TEE WITHOUT CARDIOVERSION N/A 03/03/2015   Procedure: TRANSESOPHAGEAL ECHOCARDIOGRAM (TEE);  Surgeon: [name], MD;  [facility];  Service: Cardiology;  Laterality: N/A;  . UMBILICAL HERNIA REPAIR     with other hernia repair with mesh  . WRIST SURGERY Right    fusion    There were no vitals filed for this visit.      Subjective Assessment - 06/08/16 2156    Subjective "My mouth has been hurting."   Currently in Pain? No/denies               ADULT SLP TREATMENT - 06/08/16 0001      General Information   Behavior/Cognition Cooperative;Pleasant mood   Patient Positioning Upright in chair   Oral care provided N/A   HPI Travis [name] is a 56 yo male who was referred for SLE by his neurologist, Dr. Erlinda Hong. Travis [name] is known to this SLP from previous cognitive linguistic therapy following a stroke. He was admitted to [facility] on 05/02/16 due to dizziness, leaning towards left, slurry speech, left arm numbnessand generalized weakness. MRI showed left dorsal medulla small infarct. CTA head  and neck, CUS and TTE unremarkable. LDL 60 and A1C 5.4. He had cough and found to have RSV URI. He was continued on eliquis and lipitor on discharge. Since discharge, pt has been doing better. Stated that speech much clearer but slower, still has left forearm bandlike numbness and tightness feeling, more around wrist. Motor back to baseline, still walk with cane for safety. BP 119/78. Wife still complains of s/s of OSA, pt was diagnosed with OSA but refused CPAP. May consider nasal pillow, he will follow up with PCP next week to arrange.     Treatment Provided   Treatment provided Cognitive-Linquistic     Pain Assessment   Pain Assessment No/denies pain     Cognitive-Linquistic  Treatment   Treatment focused on Cognition;Dysarthria   Skilled Treatment SLP feedback, implementation of compensatory strategies, Pt self evaluation     Assessment / Recommendations / Plan   Plan Continue with current plan of care     Progression Toward Goals   Progression toward goals Progressing toward goals            SLP Short Term Goals - 06/08/16 2159      SLP SHORT TERM GOAL #1   Title Pt will implement memory strategies in functional tasks for 90% acc with min assist   Baseline 2/5 recall of words without cues   Time 4   Period Weeks   Status On-going     SLP SHORT TERM GOAL #2   Title Pt will implement speech intelligibility strategies during conversational speech to increase naturalness and accuracy with 95% accuracy as judged by SLP in 1:1 conversations.   Baseline Pt with hesitations, repetitions, revisions of whole words in conversation 20% of the time.   Time 4   Period Weeks   Status On-going     SLP SHORT TERM GOAL #3   Title Pt will verbalize at least 4 compensatory strategies for speech intelligibility and memory with indirect cues.   Baseline Pt verbalizes 2 with cues   Time 4   Period Weeks   Status On-going          SLP Long Term Goals - 06/08/16 2200      SLP LONG TERM GOAL #1   Title Same as short term          Plan - 06/08/16 2159    Clinical Impression Statement Travis [name] was pleasant and engaged throughout SLP session this date. He was reminded about SLP goals targeting speech intelligibility and memory. Pt reports new onset tooth pain on lower left side of mouth to which he has been seeing an Chief Financial Officer in [place]. Pt wonders if his "tooth is causing all of (his) problems". Pt required direct cues to recall speech intelligiblity strategies. SLP encouraged pacing and over articulation strategies, while still attempting to maintain "naturalness of speech". Pt orally read short story, which was recorded for pt self  evaluation/feedback. SLP emphasized accuracy over speed and then encouraged a faster pace once accuracy was maintained. Pt is making good progress toward goals. Continue plan of care.    Speech Therapy Frequency 2x / week   Duration 4 weeks   Treatment/Interventions SLP instruction and feedback;Compensatory strategies;Patient/family education   Potential to Achieve Goals Good   Potential Considerations Previous level of function   SLP Home Exercise Plan Pt will be independent with HEP as assigned to facilitate carryover of treatment strategies and techniques in home environment.   Consulted and Agree with Plan of Care  Patient      Patient will benefit from skilled therapeutic intervention in order to improve the following deficits and impairments:   Cognitive communication deficit  Dysarthria    Problem List Patient Active Problem List   Diagnosis Date Noted  . Generalized anxiety disorder 05/15/2016  . Acute bronchiolitis due to respiratory syncytial virus (RSV)   . Acute CVA (cerebrovascular accident) ([place]) 05/04/2016  . Dizziness 05/02/2016  . Anxiety and depression 05/02/2016  . Paroxysmal atrial fibrillation ([place]) 03/02/2016  . PFO (patent foramen ovale) 07/24/2015  . Chronic deep vein thrombosis (DVT) of popliteal vein of left lower extremity ([place]) 07/24/2015  . OSA (obstructive sleep apnea) 04/18/2015  . 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 artery ([place]) 04/18/2015  . Cerebellar stroke ([place]) 02/28/2015  . Snoring 01/23/2015  . Degeneration of cervical intervertebral disc 01/23/2015  . Essential hypertension 12/14/2014  . Diabetes mellitus type II, controlled ([place]) 12/14/2014  . HLD (hyperlipidemia) 12/14/2014  . Neck pain 12/14/2014  . Cerebral infarction, chronic   . Vertigo 10/14/2014  . 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 Impingement syndrome of left shoulder 08/02/2014  . 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 Abdominal pain  06/13/2014  . Rectal bleeding 03/13/2014  . Diabetes type 2, controlled ([place]) 02/18/2014  . Diabetic peripheral neuropathy ([place]) 03/28/2013  . Irreducible incisional hernia 11/11/2010   Thank you,  Genene Churn, [place]  Shirah Roseman 06/08/2016, 10:02 PM  [facility] [address] [place], [place], [address] Phone: [phone]   Fax:  [phone]   Name: ALIAN VANDUYNE MRN: [id number] Date of Birth: 12/20/1960

## 2016-06-08 NOTE — Therapy (Signed)
[facility] [place], [place], [address] Phone: [phone]   Fax:  [phone]  Physical Therapy Treatment  Patient Details  Name: Travis [name] MRN: [id number] Date of Birth: 1960/05/20 Referring Provider: [name] xu   Encounter Date: 06/08/2016      PT End of Session - 06/08/16 1356    Visit Number 3   Number of Visits 8   Date for PT Re-Evaluation 06/21/16   Authorization Type [name]    Authorization Time Period 05/24/16 to 06/24/16   PT Start Time 1304   PT Stop Time 1342   PT Time Calculation (min) 38 min   Equipment Utilized During Treatment Gait belt   Activity Tolerance Patient tolerated treatment well   Behavior During Therapy [facility] for tasks assessed/performed      Past Medical History:  Diagnosis Date  . A-fib ([place]) 02/2016   found on loop recorder  . 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 Anxiety   . Arthritis   . Blood transfusion without reported diagnosis   . Chronic headaches   . Complication of anesthesia    bleed after last shoulder-had to stay overnight  . Depression   . Diabetes mellitus   . Diabetic peripheral neuropathy ([place]) 03/28/2013  . Diverticulosis   . Dizziness   . Hernia, inguinal   . Hyperlipidemia   . Hypertension   . Impingement syndrome of left shoulder 08/02/2014  . 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 Neuropathy of lower extremity   . Night sweats    every once in a while  . Sleep apnea    no CPAP  . Stroke ([place]) 02/2015  . Syncope and collapse     Past Surgical History:  Procedure Laterality Date  . CERVICAL DISC SURGERY    . COLON SURGERY  2003   1/3 removed for diverticulitis  . COLONOSCOPY    . EP IMPLANTABLE DEVICE N/A 05/01/2015   Procedure: Loop Recorder Insertion;  Surgeon: [name], MD;  Location: [facility];  Service: Cardiovascular;  Laterality: N/A;  . INGUINAL HERNIA REPAIR Bilateral   . KNEE ARTHROSCOPY Right   .  SHOULDER ARTHROSCOPY Right    1  . SHOULDER ARTHROSCOPY Left 08/02/2014   Procedure: LEFT SHOULDER SCOPE DEBRIDEMENT/ACROMIOPLASTY;  Surgeon: [name], MD;  Location: [facility];  Service: Orthopedics;  Laterality: Left;  ANESTHESIA: GENERAL, PRE/POST OP SCALENE  . TEE WITHOUT CARDIOVERSION N/A 03/03/2015   Procedure: TRANSESOPHAGEAL ECHOCARDIOGRAM (TEE);  Surgeon: [name], MD;  [facility];  Service: Cardiology;  Laterality: N/A;  . UMBILICAL HERNIA REPAIR     with other hernia repair with mesh  . WRIST SURGERY Right    fusion    There were no vitals filed for this visit.      Subjective Assessment - 06/08/16 1350    Subjective Patient arrives today stating that he is really having quite a bit of pain from his tooth today; he is in process of getting in with an orthopedic for potential surgery    Pertinent History history of multiple CVAs, patent foramen ovale, A-fib, DM with neuropathy, anxiety and depression, vertigo    Patient Stated Goals "get better", get endurance back up   Currently in Pain? Other (Comment)  did not give numerical rating, states, "it hurts a lot"  Balance Exercises - 06/08/16 1352      Balance Exercises: Standing   Stepping Strategy Anterior;Posterior   Rockerboard Anterior/posterior;Lateral;EO;Other (comment)  HHA PRN    Step Ups Forward;Intermittent UE support  BOSU    Other Standing Exercises standing high kneees forwards/backwards; anterior/posterior stepping strategy, tossing/kicking ball in hallway, cone reaches in narrow BOS and tandem stance             PT Education - 06/08/16 1356    Education provided No          PT Short Term Goals - 05/24/16 1713      PT SHORT TERM GOAL #1   Title Patient to be able to list 5/5 safety precautions in order to show improved safety awareness and reduced risk of fall    Time 2   Period Weeks   Status New      PT SHORT TERM GOAL #2   Title Patient to be able to state the importance of regularly using appropriate assistive device in order to prevent fall in order to show improved safety awareness/reduced fall risk    Time 2   Period Weeks   Status New     PT SHORT TERM GOAL #3   Title Patient to be independent in correctly and consistently performing appropriate HEP, to be progressed PRN    Time 1   Period Weeks   Status New           PT Long Term Goals - 05/24/16 1714      PT LONG TERM GOAL #1   Title Patient to demonstrate functional strength 5/5 in order to improve balance and mobility    Time 4   Period Weeks   Status New     PT LONG TERM GOAL #2   Title Patient to score at least 49 on BERG balance test in order to show improved general safety and reduced fall risk    Time 4   Period Weeks   Status New     PT LONG TERM GOAL #3   Title Patient to score no less than 19 on DGI in order to show balance adequate enough to assist in preventing falls in dynamic situations   Time 4   Period Weeks   Status New               Plan - 06/08/16 1357    Clinical Impression Statement Patient arrives today reporting his main complaint is his tooth pain today. Focused in general on functional balance based tasks on CKC positions; used second person for ball toss activities and reaching activities in narrow BOS and tandem stance for safety. Patient tends to become easily distracted, requiring cues for safety with all functional tasks today. Patient fatigued at end of session.    Rehab Potential Good   Clinical Impairments Affecting Rehab Potential (+) pleasant and appears motivated to participate in skilled PT services; (-) multiple co-morbidities including history of multiple CVAs    PT Frequency 2x / week   PT Duration 4 weeks   PT Treatment/Interventions ADLs/Self Care Home Management;Biofeedback;DME Instruction;Gait training;Stair training;Functional mobility training;Therapeutic  activities;Therapeutic exercise;Balance training;Neuromuscular re-education;Patient/family education;Manual techniques;Energy conservation;Taping   PT Next Visit Plan Primary focus is on BALANCE. Increase difficulty of tasks next session by adding more foam based tasks.    PT Home Exercise Plan 1/25: SLS, tandem stance, standing marches    Consulted and Agree with Plan of Care Patient      Patient will benefit from  skilled therapeutic intervention in order to improve the following deficits and impairments:  Abnormal gait, Improper body mechanics, Decreased coordination, Decreased activity tolerance, Decreased strength, Decreased balance, Difficulty walking, Decreased safety awareness  Visit Diagnosis: Unsteadiness on feet  Other abnormalities of gait and mobility  Muscle weakness (generalized)     Problem List Patient Active Problem List   Diagnosis Date Noted  . Generalized anxiety disorder 05/15/2016  . Acute bronchiolitis due to respiratory syncytial virus (RSV)   . Acute CVA (cerebrovascular accident) ([place]) 05/04/2016  . Dizziness 05/02/2016  . Anxiety and depression 05/02/2016  . Paroxysmal atrial fibrillation ([place]) 03/02/2016  . PFO (patent foramen ovale) 07/24/2015  . Chronic deep vein thrombosis (DVT) of popliteal vein of left lower extremity ([place]) 07/24/2015  . OSA (obstructive sleep apnea) 04/18/2015  . 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 artery ([place]) 04/18/2015  . Cerebellar stroke ([place]) 02/28/2015  . Snoring 01/23/2015  . Degeneration of cervical intervertebral disc 01/23/2015  . Essential hypertension 12/14/2014  . Diabetes mellitus type II, controlled ([place]) 12/14/2014  . HLD (hyperlipidemia) 12/14/2014  . Neck pain 12/14/2014  . Cerebral infarction, chronic   . Vertigo 10/14/2014  . 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 Impingement syndrome of left shoulder 08/02/2014  . 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 Abdominal pain  06/13/2014  . Rectal bleeding 03/13/2014  . Diabetes type 2, controlled ([place]) 02/18/2014  . Diabetic peripheral neuropathy ([place]) 03/28/2013  . Irreducible incisional hernia 11/11/2010    Deniece Ree PT, DPT [facility] [address] [place], [place], [address] Phone: [phone]   Fax:  [phone]  Name: Travis [name] MRN: [id number] Date of Birth: 19-Nov-1960

## 2016-06-08 NOTE — Therapy (Signed)
[place], [place], [address] Phone: [phone]   Fax:  [phone]  Occupational Therapy Treatment  Patient Details  Name: Travis [name] MRN: [id number] Date of Birth: 13-Jan-1961 Referring Provider: Dr. Rosalin Hawking  Encounter Date: 06/08/2016      OT End of Session - 06/08/16 1442    Visit Number 3   Number of Visits 16   Date for OT Re-Evaluation 07/16/16  mini reassessment 2/16   Authorization Type [place] [employer]    OT Start Time 1345   OT Stop Time 1434   OT Time Calculation (min) 49 min   Activity Tolerance Patient tolerated treatment well   Behavior During Therapy [facility] for tasks assessed/performed      Past Medical History:  Diagnosis Date  . A-fib ([place]) 02/2016   found on loop recorder  . 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 Anxiety   . Arthritis   . Blood transfusion without reported diagnosis   . Chronic headaches   . Complication of anesthesia    bleed after last shoulder-had to stay overnight  . Depression   . Diabetes mellitus   . Diabetic peripheral neuropathy ([place]) 03/28/2013  . Diverticulosis   . Dizziness   . Hernia, inguinal   . Hyperlipidemia   . Hypertension   . Impingement syndrome of left shoulder 08/02/2014  . 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 Neuropathy of lower extremity   . Night sweats    every once in a while  . Sleep apnea    no CPAP  . Stroke ([place]) 02/2015  . Syncope and collapse     Past Surgical History:  Procedure Laterality Date  . CERVICAL DISC SURGERY    . COLON SURGERY  2003   1/3 removed for diverticulitis  . COLONOSCOPY    . EP IMPLANTABLE DEVICE N/A 05/01/2015   Procedure: Loop Recorder Insertion;  Surgeon: [name], MD;  Location: [facility];  Service: Cardiovascular;  Laterality: N/A;  . INGUINAL HERNIA REPAIR Bilateral   . KNEE ARTHROSCOPY Right   . SHOULDER ARTHROSCOPY Right    1  . SHOULDER ARTHROSCOPY Left 08/02/2014    Procedure: LEFT SHOULDER SCOPE DEBRIDEMENT/ACROMIOPLASTY;  Surgeon: [name], MD;  Location: [facility];  Service: Orthopedics;  Laterality: Left;  ANESTHESIA: GENERAL, PRE/POST OP SCALENE  . TEE WITHOUT CARDIOVERSION N/A 03/03/2015   Procedure: TRANSESOPHAGEAL ECHOCARDIOGRAM (TEE);  Surgeon: [name], MD;  [facility];  Service: Cardiology;  Laterality: N/A;  . UMBILICAL HERNIA REPAIR     with other hernia repair with mesh  . WRIST SURGERY Right    fusion    There were no vitals filed for this visit.      Subjective Assessment - 06/08/16 1346    Subjective  S: My shoulders are hurting a little bit today.    Currently in Pain? Yes   Pain Score 7    Pain Location Shoulder   Pain Orientation Right;Left   Pain Descriptors / Indicators Sore   Pain Type Acute pain   Pain Radiating Towards none   Pain Onset More than a month ago   Pain Frequency Constant   Aggravating Factors  n/a   Pain Relieving Factors repositioning, movement   Effect of Pain on Daily Activities minimal effect    Multiple Pain Sites No            [facility] OT Assessment - 06/08/16 1345  Assessment   Diagnosis LUE Weakness S/P CVA     Precautions   Precautions None                  OT Treatments/Exercises (OP) - 06/08/16 1347      Exercises   Exercises Shoulder;Hand     Shoulder Exercises: Standing   Protraction Theraband;12 reps   Theraband Level (Shoulder Protraction) Level 3 (Green)   Horizontal ABduction Theraband;12 reps   Theraband Level (Shoulder Horizontal ABduction) Level 3 (Green)   External Rotation Theraband;12 reps   Theraband Level (Shoulder External Rotation) Level 3 (Green)   Internal Rotation Theraband;12 reps   Theraband Level (Shoulder Internal Rotation) Level 3 (Green)   Flexion Theraband;12 reps   Theraband Level (Shoulder Flexion) Level 3 (Green)   ABduction Theraband;12 reps   Theraband Level (Shoulder ABduction)  Level 3 (Green)   Extension [employer] reps   Theraband Level (Shoulder Extension) Level 3 (Green)   Row [employer] reps   Theraband Level (Shoulder Row) Level 3 (Green)   Retraction Theraband;12 reps   Theraband Level (Shoulder Retraction) Level 3 (Green)     Hand Exercises   Hand Gripper with Large Beads --   Hand Gripper with Medium Beads 13/13 beads gripper set at 42#   Hand Gripper with Small Beads 16/16 beads gripper set at 42#     Fine Motor Coordination   Fine Motor Coordination Grooved pegs   Grooved pegs Pt picked up 5-10 pegs at one time and practiced palm to fingertip translation before placing in pegboard. Pt then picked up all coins from pegboard, keeping all pegs in palm. Min difficulty             Balance Exercises - 06/08/16 1352      Balance Exercises: Standing   Stepping Strategy Anterior;Posterior   Rockerboard Anterior/posterior;Lateral;EO;Other (comment)  HHA PRN    Step Ups Forward;Intermittent UE support  BOSU    Other Standing Exercises standing high kneees forwards/backwards; anterior/posterior stepping strategy, tossing/kicking ball in hallway, cone reaches in narrow BOS and tandem stance             OT Education - 06/08/16 1423    Education provided Yes   Education Details theraband stengthening   Person(s) Educated Patient   Methods Explanation;Demonstration;Handout   Comprehension Verbalized understanding;Returned demonstration          OT Short Term Goals - 05/27/16 1606      OT SHORT TERM GOAL #1   Title Pt will be educated on and independent in HEP.    Time 4   Period Weeks   Status On-going     OT SHORT TERM GOAL #2   Title Patient will increase left upper extremity strength to 4/5 for improved ability to complete yard work.   Time 4   Period Weeks   Status On-going     OT SHORT TERM GOAL #3   Title Pt will increase LUE grip strength by 10# to increase ability to hold onto 2 liter drink bottles.    Time 4    Period Weeks   Status On-going     OT SHORT TERM GOAL #4   Title Patient will increase left hand pinch strength by 2 pounds for improved ability to manipulate household items.    Time 4   Period Weeks   Status On-going     OT SHORT TERM GOAL #5   Title Patient will increase sustained activity tolerance by 25% for improved ability to complete  daily activities.    Time 4   Period Weeks   Status On-going           OT Long Term Goals - 05/27/16 1607      OT LONG TERM GOAL #1   Title Patient will return to highest level of independence with all daily and leisure tasks.    Time 8   Period Weeks   Status On-going     OT LONG TERM GOAL #2   Title Patient will demonstrate 4+/5 strength in his LUE for improved ability to particpate in yard work.   Time 8   Period Weeks   Status On-going     OT LONG TERM GOAL #3   Title Patient will improve left grip strength by 20# and pinch strength by 4# for improved ability to open containers and manipulate objects.    Time 8   Period Months   Status On-going     OT LONG TERM GOAL #4   Title Patient will improve sustained activity tolerance by 50% for increased independence with all daily activities.    Time 8   Period Weeks   Status On-going     OT LONG TERM GOAL #5   Title Patient will complete in hand manipulation activities without difficulty.    Time 8   Period Weeks   Status On-going     OT LONG TERM GOAL #6   Title Patient will be educated on energy conservation and work simplification principles and independently apply to daily tasks for improved safety.    Time 8   Period Weeks   Status On-going               Plan - 06/08/16 1442    Clinical Impression Statement A: Added green theraband strengthening this session as well as scapular theraband exercises. Increased gripper resistance, pt appeared to have difficulty at times and flew through gripping beads at other times during activity. Pt requires verbal cuing to  remain on task. Provided green theraband strengthening for HEP.    Plan P: Continue with LUE strengthening, use 2# weights, complete prone hughston exercises for improved scapular strength   OT Home Exercise Plan 05/17/16:  proximal shoulder strengthening, theraputty for grip and pinch; 2/6: green theraband strengthening   Consulted and Agree with Plan of Care Patient      Patient will benefit from skilled therapeutic intervention in order to improve the following deficits and impairments:  Decreased coordination, Decreased endurance, Decreased strength, Impaired UE functional use  Visit Diagnosis: Other symptoms and signs involving the musculoskeletal system  Other lack of coordination    Problem List Patient Active Problem List   Diagnosis Date Noted  . Generalized anxiety disorder 05/15/2016  . Acute bronchiolitis due to respiratory syncytial virus (RSV)   . Acute CVA (cerebrovascular accident) ([place]) 05/04/2016  . Dizziness 05/02/2016  . Anxiety and depression 05/02/2016  . Paroxysmal atrial fibrillation ([place]) 03/02/2016  . PFO (patent foramen ovale) 07/24/2015  . Chronic deep vein thrombosis (DVT) of popliteal vein of left lower extremity ([place]) 07/24/2015  . OSA (obstructive sleep apnea) 04/18/2015  . 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 artery ([place]) 04/18/2015  . Cerebellar stroke ([place]) 02/28/2015  . Snoring 01/23/2015  . Degeneration of cervical intervertebral disc 01/23/2015  . Essential hypertension 12/14/2014  . Diabetes mellitus type II, controlled ([place]) 12/14/2014  . HLD (hyperlipidemia) 12/14/2014  . Neck pain 12/14/2014  . Cerebral infarction, chronic   . Vertigo 10/14/2014  .  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 Impingement syndrome of left shoulder 08/02/2014  . 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 Abdominal pain 06/13/2014  . Rectal bleeding 03/13/2014  . Diabetes type 2, controlled ([place]) 02/18/2014  . Diabetic peripheral  neuropathy ([place]) 03/28/2013  . Irreducible incisional hernia 11/11/2010   Guadelupe Sabin, OTR/L  [phone] 06/08/2016, 2:46 PM  [place] [address] [place], [place], [address] Phone: [phone]   Fax:  [phone]  Name: Travis [name] MRN: [id number] Date of Birth: 25-Aug-1960

## 2016-06-08 NOTE — Patient Instructions (Signed)
Theraband strengthening: Complete 10-15X, 1-2X/day  1) Shoulder protraction  Anchor band in doorway, stand with back to door. Push your hand forward as much as you can to bringing your shoulder blades forward on your rib cage.     2) Shoulder flexion  While standing with back to the door, holding Theraband at hand level, raise arm in front of you.  Keep elbow straight through entire movement.      3) Shoulder horizontal abduction  Standing with a theraband anchored at chest height, begin with arm straight and some tension in the band. Move your arm out to your side (keeping straight the whole time). Bring the affected arm back to midline.     4) Shoulder Internal Rotation  While holding an elastic band at your side with your elbow bent, start with your hand away from your stomach, then pull the band towards your stomach. Keep your elbow near your side the entire time.     5) Shoulder External Rotation  While holding an elastic band at your side with your elbow bent, start with your hand near your stomach and then pull the band away. Keep your elbow at your side the entire time.     6) Shoulder abduction  While holding an elastic band at your side, draw up your arm to the side keeping your elbow straight.   

## 2016-06-10 ENCOUNTER — Ambulatory Visit (HOSPITAL_COMMUNITY): Payer: 59 | Admitting: Speech Pathology

## 2016-06-10 ENCOUNTER — Ambulatory Visit (HOSPITAL_COMMUNITY): Payer: 59 | Admitting: Physical Therapy

## 2016-06-10 ENCOUNTER — Encounter (HOSPITAL_COMMUNITY): Payer: Self-pay | Admitting: Physical Therapy

## 2016-06-10 ENCOUNTER — Encounter (HOSPITAL_COMMUNITY): Payer: Self-pay | Admitting: Speech Pathology

## 2016-06-10 ENCOUNTER — Encounter (HOSPITAL_COMMUNITY): Payer: Self-pay | Admitting: Occupational Therapy

## 2016-06-10 ENCOUNTER — Telehealth: Payer: Self-pay | Admitting: Family Medicine

## 2016-06-10 ENCOUNTER — Ambulatory Visit (HOSPITAL_COMMUNITY): Payer: 59 | Admitting: Occupational Therapy

## 2016-06-10 DIAGNOSIS — R29898 Other symptoms and signs involving the musculoskeletal system: Secondary | ICD-10-CM

## 2016-06-10 DIAGNOSIS — R41841 Cognitive communication deficit: Secondary | ICD-10-CM | POA: Diagnosis not present

## 2016-06-10 DIAGNOSIS — R278 Other lack of coordination: Secondary | ICD-10-CM

## 2016-06-10 DIAGNOSIS — M6281 Muscle weakness (generalized): Secondary | ICD-10-CM | POA: Diagnosis not present

## 2016-06-10 DIAGNOSIS — R2681 Unsteadiness on feet: Secondary | ICD-10-CM | POA: Diagnosis not present

## 2016-06-10 DIAGNOSIS — R471 Dysarthria and anarthria: Secondary | ICD-10-CM | POA: Diagnosis not present

## 2016-06-10 DIAGNOSIS — R2689 Other abnormalities of gait and mobility: Secondary | ICD-10-CM | POA: Diagnosis not present

## 2016-06-10 MED ORDER — PREGABALIN 100 MG PO CAPS: 100.0000 mg | ORAL_CAPSULE | Freq: Three times a day (TID) | ORAL | 1 refills | Status: DC

## 2016-06-10 MED FILL — LYRICA 100 MG CAPSULE: 100 | 90 days supply | Qty: 270 | Fill #0

## 2016-06-10 NOTE — Addendum Note (Signed)
Addended by: Launa Grill on: 06/10/2016 10:33 AM   Modules accepted: Orders

## 2016-06-10 NOTE — Therapy (Signed)
[place], [place], [address] Phone: [phone]   Fax:  [phone]  Occupational Therapy Treatment  Patient Details  Name: Travis [name] MRN: [id number] Date of Birth: [birth year] Referring Provider: Dr. Rosalin Hawking  Encounter Date: 06/10/2016      OT End of Session - 06/10/16 1525    Visit Number 4   Number of Visits 16   Date for OT Re-Evaluation 07/16/16  mini reassessment 2/16   Authorization Type [name] [employer]    OT Start Time 1431   OT Stop Time 1513   OT Time Calculation (min) 42 min   Activity Tolerance Patient tolerated treatment well   Behavior During Therapy [facility] for tasks assessed/performed      Past Medical History:  Diagnosis Date  . A-fib ([place]) 02/2016   found on loop recorder  . 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 Anxiety   . Arthritis   . Blood transfusion without reported diagnosis   . Chronic headaches   . Complication of anesthesia    bleed after last shoulder-had to stay overnight  . Depression   . Diabetes mellitus   . Diabetic peripheral neuropathy ([place]) 03/28/2013  . Diverticulosis   . Dizziness   . Hernia, inguinal   . Hyperlipidemia   . Hypertension   . Impingement syndrome of left shoulder 08/02/2014  . 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 Neuropathy of lower extremity   . Night sweats    every once in a while  . Sleep apnea    no CPAP  . Stroke ([place]) 02/2015  . Syncope and collapse     Past Surgical History:  Procedure Laterality Date  . CERVICAL DISC SURGERY    . COLON SURGERY  2003   1/3 removed for diverticulitis  . COLONOSCOPY    . EP IMPLANTABLE DEVICE N/A 05/01/2015   Procedure: Loop Recorder Insertion;  Surgeon: [name], MD;  Location: [facility];  Service: Cardiovascular;  Laterality: N/A;  . INGUINAL HERNIA REPAIR Bilateral   . KNEE ARTHROSCOPY Right   . SHOULDER ARTHROSCOPY Right    1  . SHOULDER ARTHROSCOPY Left 08/02/2014    Procedure: LEFT SHOULDER SCOPE DEBRIDEMENT/ACROMIOPLASTY;  Surgeon: [name], MD;  Location: [facility];  Service: Orthopedics;  Laterality: Left;  ANESTHESIA: GENERAL, PRE/POST OP SCALENE  . TEE WITHOUT CARDIOVERSION N/A 03/03/2015   Procedure: TRANSESOPHAGEAL ECHOCARDIOGRAM (TEE);  Surgeon: [name], MD;  [facility];  Service: Cardiology;  Laterality: N/A;  . UMBILICAL HERNIA REPAIR     with other hernia repair with mesh  . WRIST SURGERY Right    fusion    There were no vitals filed for this visit.      Subjective Assessment - 06/10/16 1436    Subjective  S:    Currently in Pain? Yes   Pain Score 6    Pain Location Shoulder   Pain Orientation Right;Left   Pain Descriptors / Indicators Sore   Pain Type Acute pain   Pain Radiating Towards none   Pain Onset More than a month ago   Pain Frequency Intermittent   Aggravating Factors  n/a   Pain Relieving Factors repositioning, movement   Effect of Pain on Daily Activities min effect   Multiple Pain Sites No            [facility] OT Assessment - 06/10/16 1436      Assessment   Diagnosis LUE  Weakness S/P CVA     Precautions   Precautions None                  OT Treatments/Exercises (OP) - 06/10/16 1438      Exercises   Exercises Shoulder;Hand     Shoulder Exercises: Sidelying   External Rotation Strengthening;12 reps   External Rotation Weight (lbs) 2   Internal Rotation Strengthening;12 reps   Internal Rotation Limitations 2   Flexion Strengthening;12 reps   Flexion Limitations 2   ABduction Strengthening;12 reps   ABduction Weight (lbs) 2   Other Sidelying Exercises protraction, 12X, 2# weight   Other Sidelying Exercises horizontal abduction, 12X, 2# weight     Shoulder Exercises: Standing   Protraction Strengthening;10 reps   Protraction Weight (lbs) 2   Horizontal ABduction Strengthening;10 reps   Horizontal ABduction Weight (lbs) 2   External  Rotation Strengthening;10 reps   External Rotation Weight (lbs) 2   Internal Rotation Strengthening;10 reps   Internal Rotation Weight (lbs) 2   Flexion Strengthening;10 reps   Shoulder Flexion Weight (lbs) 2   ABduction Strengthening;10 reps   Shoulder ABduction Weight (lbs) 2     Shoulder Exercises: ROM/Strengthening   Cybex Press 2 plate;15 reps   Cybex Row 2 plate;15 reps   Wall Pushups 15 reps  at countertop   X to V Arms 12X, 2# weight   Proximal Shoulder Strengthening, Seated 10X each, 2# weight, 1 rest break   Ball on Wall 1' flexion 1' abduction                  OT Short Term Goals - 05/27/16 1606      OT SHORT TERM GOAL #1   Title Pt will be educated on and independent in HEP.    Time 4   Period Weeks   Status On-going     OT SHORT TERM GOAL #2   Title Patient will increase left upper extremity strength to 4/5 for improved ability to complete yard work.   Time 4   Period Weeks   Status On-going     OT SHORT TERM GOAL #3   Title Pt will increase LUE grip strength by 10# to increase ability to hold onto 2 liter drink bottles.    Time 4   Period Weeks   Status On-going     OT SHORT TERM GOAL #4   Title Patient will increase left hand pinch strength by 2 pounds for improved ability to manipulate household items.    Time 4   Period Weeks   Status On-going     OT SHORT TERM GOAL #5   Title Patient will increase sustained activity tolerance by 25% for improved ability to complete daily activities.    Time 4   Period Weeks   Status On-going           OT Long Term Goals - 05/27/16 1607      OT LONG TERM GOAL #1   Title Patient will return to highest level of independence with all daily and leisure tasks.    Time 8   Period Weeks   Status On-going     OT LONG TERM GOAL #2   Title Patient will demonstrate 4+/5 strength in his LUE for improved ability to particpate in yard work.   Time 8   Period Weeks   Status On-going     OT LONG TERM  GOAL #3   Title Patient will improve left grip strength  by 20# and pinch strength by 4# for improved ability to open containers and manipulate objects.    Time 8   Period Months   Status On-going     OT LONG TERM GOAL #4   Title Patient will improve sustained activity tolerance by 50% for increased independence with all daily activities.    Time 8   Period Weeks   Status On-going     OT LONG TERM GOAL #5   Title Patient will complete in hand manipulation activities without difficulty.    Time 8   Period Weeks   Status On-going     OT LONG TERM GOAL #6   Title Patient will be educated on energy conservation and work simplification principles and independently apply to daily tasks for improved safety.    Time 8   Period Weeks   Status On-going               Plan - 06/10/16 1525    Clinical Impression Statement A: Session focused on LUE strengthening this session, pt required redirection during session to remain on task. Pt completed LUE exercises with 2# weight this session, rest breaks throughout session as needed. Pt with minimal difficulty during exercise completion, verbal cuing for form and technqiue intermittently. Did not add prone exercises due to time constraints.    Plan P: Add prone hughston exercises for scapular strengthening; increase cybex row/press weight   OT Home Exercise Plan 05/17/16:  proximal shoulder strengthening, theraputty for grip and pinch; 2/6: green theraband strengthening   Consulted and Agree with Plan of Care Patient      Patient will benefit from skilled therapeutic intervention in order to improve the following deficits and impairments:  Decreased coordination, Decreased endurance, Decreased strength, Impaired UE functional use  Visit Diagnosis: Other symptoms and signs involving the musculoskeletal system  Other lack of coordination    Problem List Patient Active Problem List   Diagnosis Date Noted  . Generalized anxiety disorder  05/15/2016  . Acute bronchiolitis due to respiratory syncytial virus (RSV)   . Acute CVA (cerebrovascular accident) ([place]) 05/04/2016  . Dizziness 05/02/2016  . Anxiety and depression 05/02/2016  . Paroxysmal atrial fibrillation ([place]) 03/02/2016  . PFO (patent foramen ovale) 07/24/2015  . Chronic deep vein thrombosis (DVT) of popliteal vein of left lower extremity ([place]) 07/24/2015  . OSA (obstructive sleep apnea) 04/18/2015  . 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 artery ([place]) 04/18/2015  . Cerebellar stroke ([place]) 02/28/2015  . Snoring 01/23/2015  . Degeneration of cervical intervertebral disc 01/23/2015  . Essential hypertension 12/14/2014  . Diabetes mellitus type II, controlled ([place]) 12/14/2014  . HLD (hyperlipidemia) 12/14/2014  . Neck pain 12/14/2014  . Cerebral infarction, chronic   . Vertigo 10/14/2014  . 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 Impingement syndrome of left shoulder 08/02/2014  . 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 Abdominal pain 06/13/2014  . Rectal bleeding 03/13/2014  . Diabetes type 2, controlled ([place]) 02/18/2014  . Diabetic peripheral neuropathy ([place]) 03/28/2013  . Irreducible incisional hernia 11/11/2010   Guadelupe Sabin, OTR/L  [phone] 06/10/2016, 3:30 PM  [place], [place], [address] Phone: [phone]   Fax:  [phone]  Name: Travis [name] MRN: [id number] Date of Birth: 05-02-1961

## 2016-06-10 NOTE — Telephone Encounter (Signed)
Lyrica refill requested - [facility].

## 2016-06-10 NOTE — Therapy (Signed)
[place], [place], [address] Phone: [phone]   Fax:  [phone]  Physical Therapy Treatment  Patient Details  Name: Travis [name] MRN: [id number] Date of Birth: 06/12/1960 Referring Provider: [name] xu   Encounter Date: 06/10/2016      PT End of Session - 06/10/16 1605    Visit Number 4   Number of Visits 8   Date for PT Re-Evaluation 06/21/16   Authorization Type [name]    Authorization Time Period 05/24/16 to 06/24/16   PT Start Time 1600   PT Stop Time 1640   PT Time Calculation (min) 40 min   Equipment Utilized During Treatment Gait belt   Activity Tolerance Patient tolerated treatment well   Behavior During Therapy [facility] for tasks assessed/performed      Past Medical History:  Diagnosis Date  . A-fib ([place]) 02/2016   found on loop recorder  . 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 Anxiety   . Arthritis   . Blood transfusion without reported diagnosis   . Chronic headaches   . Complication of anesthesia    bleed after last shoulder-had to stay overnight  . Depression   . Diabetes mellitus   . Diabetic peripheral neuropathy ([place]) 03/28/2013  . Diverticulosis   . Dizziness   . Hernia, inguinal   . Hyperlipidemia   . Hypertension   . Impingement syndrome of left shoulder 08/02/2014  . 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 Neuropathy of lower extremity   . Night sweats    every once in a while  . Sleep apnea    no CPAP  . Stroke ([place]) 02/2015  . Syncope and collapse     Past Surgical History:  Procedure Laterality Date  . CERVICAL DISC SURGERY    . COLON SURGERY  2003   1/3 removed for diverticulitis  . COLONOSCOPY    . EP IMPLANTABLE DEVICE N/A 05/01/2015   Procedure: Loop Recorder Insertion;  Surgeon: [name], MD;  Location: [place] [facility];  Service: Cardiovascular;  Laterality: N/A;  . INGUINAL HERNIA REPAIR Bilateral   . KNEE ARTHROSCOPY Right   .  SHOULDER ARTHROSCOPY Right    1  . SHOULDER ARTHROSCOPY Left 08/02/2014   Procedure: LEFT SHOULDER SCOPE DEBRIDEMENT/ACROMIOPLASTY;  Surgeon: [name], MD;  Location: [place];  Service: Orthopedics;  Laterality: Left;  ANESTHESIA: GENERAL, PRE/POST OP SCALENE  . TEE WITHOUT CARDIOVERSION N/A 03/03/2015   Procedure: TRANSESOPHAGEAL ECHOCARDIOGRAM (TEE);  Surgeon: [name], MD;  [facility];  Service: Cardiology;  Laterality: N/A;  . UMBILICAL HERNIA REPAIR     with other hernia repair with mesh  . WRIST SURGERY Right    fusion    There were no vitals filed for this visit.      Subjective Assessment - 06/10/16 1603    Subjective Pt states that his tooth is still hurting him. He reports that he is getting it removed on 06/16/16. He also reports that his shoulder is hurting him today "from OT and my shoulders just hurt me anyway."   Pertinent History history of multiple CVAs, patent foramen ovale, A-fib, DM with neuropathy, anxiety and depression, vertigo    Patient Stated Goals "get better", get endurance back up   Currently in Pain? Yes   Pain Score 6    Pain Location Shoulder   Pain Orientation Right;Left   Pain Descriptors / Indicators Sore  Balance Exercises - 06/10/16 1807      Balance Exercises: Standing   Tandem Stance Foam/compliant surface;Other reps (comment)  2x10 each OH lifts and 2x5 bil rotations with 4# weight bar   SLS Eyes open;Foam/compliant surface;5 reps;15 secs  inreased difficulty with LLE   Tandem Gait Forward;Retro;Foam/compliant surface;2 reps   Sidestepping Foam/compliant support;3 reps   Marching Limitations Cone taps (x3 cones) while on airex, x 10 BLE           PT Education - 06/10/16 1800    Education provided Yes   Education Details exercise technique, proper weight shifting in order to maintain balance during balance activities   Person(s) Educated  Patient   Methods Explanation   Comprehension Verbalized understanding;Returned demonstration          PT Short Term Goals - 05/24/16 1713      PT SHORT TERM GOAL #1   Title Patient to be able to list 5/5 safety precautions in order to show improved safety awareness and reduced risk of fall    Time 2   Period Weeks   Status New     PT SHORT TERM GOAL #2   Title Patient to be able to state the importance of regularly using appropriate assistive device in order to prevent fall in order to show improved safety awareness/reduced fall risk    Time 2   Period Weeks   Status New     PT SHORT TERM GOAL #3   Title Patient to be independent in correctly and consistently performing appropriate HEP, to be progressed PRN    Time 1   Period Weeks   Status New           PT Long Term Goals - 05/24/16 1714      PT LONG TERM GOAL #1   Title Patient to demonstrate functional strength 5/5 in order to improve balance and mobility    Time 4   Period Weeks   Status New     PT LONG TERM GOAL #2   Title Patient to score at least 49 on BERG balance test in order to show improved general safety and reduced fall risk    Time 4   Period Weeks   Status New     PT LONG TERM GOAL #3   Title Patient to score no less than 19 on DGI in order to show balance adequate enough to assist in preventing falls in dynamic situations   Time 4   Period Weeks   Status New               Plan - 06/10/16 1801    Clinical Impression Statement Pt continuing to make progress in dynamic balance. Pt performed balance activities while on compliant surface which was challenging to him as demonstrated by need for intermittent UE support.   Rehab Potential Good   Clinical Impairments Affecting Rehab Potential (+) pleasant and appears motivated to participate in skilled PT services; (-) multiple co-morbidities including history of multiple CVAs    PT Frequency 2x / week   PT Duration 4 weeks   PT  Treatment/Interventions ADLs/Self Care Home Management;Biofeedback;DME Instruction;Gait training;Stair training;Functional mobility training;Therapeutic activities;Therapeutic exercise;Balance training;Neuromuscular re-education;Patient/family education;Manual techniques;Energy conservation;Taping   PT Next Visit Plan Primary focus is on BALANCE. Increase difficulty of tasks next session by adding more foam based tasks.    PT Home Exercise Plan 1/25: SLS, tandem stance, standing marches    Consulted and Agree with Plan of Care Patient  Patient will benefit from skilled therapeutic intervention in order to improve the following deficits and impairments:  Abnormal gait, Improper body mechanics, Decreased coordination, Decreased activity tolerance, Decreased strength, Decreased balance, Difficulty walking, Decreased safety awareness  Visit Diagnosis: Muscle weakness (generalized)  Other abnormalities of gait and mobility     Problem List Patient Active Problem List   Diagnosis Date Noted  . Generalized anxiety disorder 05/15/2016  . Acute bronchiolitis due to respiratory syncytial virus (RSV)   . Acute CVA (cerebrovascular accident) ([place]) 05/04/2016  . Dizziness 05/02/2016  . Anxiety and depression 05/02/2016  . Paroxysmal atrial fibrillation ([place]) 03/02/2016  . PFO (patent foramen ovale) 07/24/2015  . Chronic deep vein thrombosis (DVT) of popliteal vein of left lower extremity ([place]) 07/24/2015  . OSA (obstructive sleep apnea) 04/18/2015  . 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 artery ([place]) 04/18/2015  . Cerebellar stroke ([place]) 02/28/2015  . Snoring 01/23/2015  . Degeneration of cervical intervertebral disc 01/23/2015  . Essential hypertension 12/14/2014  . Diabetes mellitus type II, controlled ([place]) 12/14/2014  . HLD (hyperlipidemia) 12/14/2014  . Neck pain 12/14/2014  . Cerebral infarction, chronic   . Vertigo 10/14/2014  . Multi-infarct state 10/14/2014   . Impingement syndrome of left shoulder 08/02/2014  . 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 Abdominal pain 06/13/2014  . Rectal bleeding 03/13/2014  . Diabetes type 2, controlled ([place]) 02/18/2014  . Diabetic peripheral neuropathy ([place]) 03/28/2013  . Irreducible incisional hernia 11/11/2010    Geraldine Solar PT, DPT   [place] [address] [place], [place], [address] Phone: [phone]   Fax:  [phone]  Name: ODAI HUTZELL MRN: [id number] Date of Birth: 1960/06/25

## 2016-06-10 NOTE — Therapy (Signed)
[name], [place], [address] Phone: [phone]   Fax:  [phone]  Speech Language Pathology Treatment  Patient Details  Name: Travis [name] MRN: [id number] Date of Birth: December 05, 1960 Referring Provider: Rosalin [name]  Encounter Date: 06/10/2016      End of Session - 06/10/16 1522    Visit Number 5   Number of Visits 8   Date for SLP Re-Evaluation 06/17/16   Authorization Type [facility]   Authorization Time Period 05/17/2016-06/17/2016   SLP Start Time 1515   SLP Stop Time  1600   SLP Time Calculation (min) 45 min   Activity Tolerance Patient tolerated treatment well      Past Medical History:  Diagnosis Date  . A-fib ([place]) 02/2016   found on loop recorder  . 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 Anxiety   . Arthritis   . Blood transfusion without reported diagnosis   . Chronic headaches   . Complication of anesthesia    bleed after last shoulder-had to stay overnight  . Depression   . Diabetes mellitus   . Diabetic peripheral neuropathy ([place]) 03/28/2013  . Diverticulosis   . Dizziness   . Hernia, inguinal   . Hyperlipidemia   . Hypertension   . Impingement syndrome of left shoulder 08/02/2014  . 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 Neuropathy of lower extremity   . Night sweats    every once in a while  . Sleep apnea    no CPAP  . Stroke ([place]) 02/2015  . Syncope and collapse     Past Surgical History:  Procedure Laterality Date  . CERVICAL DISC SURGERY    . COLON SURGERY  2003   1/3 removed for diverticulitis  . COLONOSCOPY    . EP IMPLANTABLE DEVICE N/A 05/01/2015   Procedure: Loop Recorder Insertion;  Surgeon: Travis Sprang, MD;  Location: [place] [facility];  Service: Cardiovascular;  Laterality: N/A;  . INGUINAL HERNIA REPAIR Bilateral   . KNEE ARTHROSCOPY Right   . SHOULDER ARTHROSCOPY Right    1  . SHOULDER ARTHROSCOPY Left 08/02/2014   Procedure: LEFT SHOULDER SCOPE  DEBRIDEMENT/ACROMIOPLASTY;  Surgeon: Travis Bond, MD;  Location: [place];  Service: Orthopedics;  Laterality: Left;  ANESTHESIA: GENERAL, PRE/POST OP SCALENE  . TEE WITHOUT CARDIOVERSION N/A 03/03/2015   Procedure: TRANSESOPHAGEAL ECHOCARDIOGRAM (TEE);  Surgeon: Travis Commons, MD;  [facility];  Service: Cardiology;  Laterality: N/A;  . UMBILICAL HERNIA REPAIR     with other hernia repair with mesh  . WRIST SURGERY Right    fusion    There were no vitals filed for this visit.      Subjective Assessment - 06/10/16 1518    Subjective "The Farmer's Almanac says next week is better for a tooth extraction."   Currently in Pain? No/denies          ADULT SLP TREATMENT - 06/10/16 1519      General Information   Behavior/Cognition Cooperative;Pleasant mood   Patient Positioning Upright in chair   Oral care provided N/A   HPI [name] is a 56 yo male who was referred for SLE by his neurologist, Dr. Erlinda [name]. [name] is known to this SLP from previous cognitive linguistic therapy following a stroke. He was admitted to [facility] on 05/02/16 due to dizziness, leaning towards left, slurry speech, left arm numbnessand generalized weakness. MRI showed left dorsal medulla small infarct.  CTA head and neck, CUS and TTE unremarkable. LDL 60 and A1C 5.4. He had cough and found to have RSV URI. He was continued on eliquis and lipitor on discharge. Since discharge, pt has been doing better. Stated that speech much clearer but slower, still has left forearm bandlike numbness and tightness feeling, more around wrist. Motor back to baseline, still walk with cane for safety. BP 119/78. Wife still complains of s/s of OSA, pt was diagnosed with OSA but refused CPAP. May consider nasal pillow, he will follow up with PCP next week to arrange.     Treatment Provided   Treatment provided Cognitive-Linquistic     Cognitive-Linquistic Treatment   Treatment  focused on Cognition;Dysarthria   Skilled Treatment SLP feedback, implementation of compensatory strategies, Pt self evaluation     Assessment / Recommendations / Plan   Plan Continue with current plan of care     Progression Toward Goals   Progression toward goals Progressing toward goals            SLP Short Term Goals - 06/10/16 1525      SLP SHORT TERM GOAL #1   Title Pt will implement memory strategies in functional tasks for 90% acc with min assist   Baseline 2/5 recall of words without cues   Time 4   Period Weeks   Status On-going     SLP SHORT TERM GOAL #2   Title Pt will implement speech intelligibility strategies during conversational speech to increase naturalness and accuracy with 95% accuracy as judged by SLP in 1:1 conversations.   Baseline Pt with hesitations, repetitions, revisions of whole words in conversation 20% of the time.   Time 4   Period Weeks   Status On-going     SLP SHORT TERM GOAL #3   Title Pt will verbalize at least 4 compensatory strategies for speech intelligibility and memory with indirect cues.   Baseline Pt verbalizes 2 with cues   Time 4   Period Weeks   Status On-going          SLP Long Term Goals - 06/10/16 1525      SLP LONG TERM GOAL #1   Title Same as short term          Plan - 06/10/16 1522    Clinical Impression Statement Pt with improved speech fluency and intelligibility this date as judged by SLP and Pt self perception. Pt reports continued pain in mouth and has an appointment next week for extraction. Pt required initial cues only for speech enhancement techniques prior to oral reading of short story. Pt able to provide summary of material read and answered comprehension questions with 90% acc and indirect cues. He required cues to remain on topic/task due to tangential speech at times. Pt stated that he needed to make some more appointments; suspect only 1-2 more sessions needed.   Speech Therapy Frequency 2x /  week   Duration 4 weeks   Treatment/Interventions SLP instruction and feedback;Compensatory strategies;Patient/family education   Potential to Achieve Goals Good   Potential Considerations Previous level of function   SLP Home Exercise Plan Pt will be independent with HEP as assigned to facilitate carryover of treatment strategies and techniques in home environment.   Consulted and Agree with Plan of Care Patient      Patient will benefit from skilled therapeutic intervention in order to improve the following deficits and impairments:   Cognitive communication deficit  Dysarthria    Problem List Patient  Active Problem List   Diagnosis Date Noted  . Generalized anxiety disorder 05/15/2016  . Acute bronchiolitis due to respiratory syncytial virus (RSV)   . Acute CVA (cerebrovascular accident) ([place]) 05/04/2016  . Dizziness 05/02/2016  . Anxiety and depression 05/02/2016  . Paroxysmal atrial fibrillation ([place]) 03/02/2016  . PFO (patent foramen ovale) 07/24/2015  . Chronic deep vein thrombosis (DVT) of popliteal vein of left lower extremity ([place]) 07/24/2015  . OSA (obstructive sleep apnea) 04/18/2015  . 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 artery ([place]) 04/18/2015  . Cerebellar stroke ([place]) 02/28/2015  . Snoring 01/23/2015  . Degeneration of cervical intervertebral disc 01/23/2015  . Essential hypertension 12/14/2014  . Diabetes mellitus type II, controlled ([place]) 12/14/2014  . HLD (hyperlipidemia) 12/14/2014  . Neck pain 12/14/2014  . Cerebral infarction, chronic   . Vertigo 10/14/2014  . 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 Impingement syndrome of left shoulder 08/02/2014  . 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 Abdominal pain 06/13/2014  . Rectal bleeding 03/13/2014  . Diabetes type 2, controlled ([place]) 02/18/2014  . Diabetic peripheral neuropathy ([place]) 03/28/2013  . Irreducible incisional hernia 11/11/2010   Thank you,  Genene Churn,  [place]  [facility] 06/10/2016, 3:33 PM  [facility] [address] [place], [place], [address] Phone: [phone]   Fax:  [phone]   Name: Travis [name] MRN: [id number] Date of Birth: [birth year]

## 2016-06-11 ENCOUNTER — Encounter: Payer: Self-pay | Admitting: Family Medicine

## 2016-06-11 ENCOUNTER — Ambulatory Visit (INDEPENDENT_AMBULATORY_CARE_PROVIDER_SITE_OTHER): Payer: 59 | Admitting: Family Medicine

## 2016-06-11 VITALS — BP 122/80 | Ht 73.0 in | Wt 175.1 lb

## 2016-06-11 DIAGNOSIS — I480 Paroxysmal atrial fibrillation: Secondary | ICD-10-CM

## 2016-06-11 NOTE — Progress Notes (Signed)
   Subjective:    Patient ID: Travis [name], male    DOB: 1960-06-09, 56 y.o.   MRN: [id number]  HPI Patient is here today for a follow up on stress related issues. Patient is currently taking clindamycin for an infected tooth. Patient wants to know do he need to stop the Eliquis before having surgery on his tooth.  Patient states that he is having trouble sleeping at night.  Patient has no other concerns at this time.  Patient relates that abscess tooth is causing a lot of pain he relates he's on medication for this and the dentist once to pull his tooth next week they told him to come see his doctor in order to get permission to come off of they'll request patient understands that coming off the anticoagulant runs a risk of a slight possibility of a stroke but that it is necessary in order to get the surgery on his infected tooth Review of Systems  Constitutional: Negative for fatigue and fever.  HENT: Negative for congestion.   Respiratory: Negative for cough.   Cardiovascular: Negative for chest pain.  Gastrointestinal: Negative for abdominal pain.       Objective:   Physical Exam  Constitutional: He appears well-nourished.  Cardiovascular: Normal rate and normal heart sounds.   No murmur heard. Pulmonary/Chest: Effort normal and breath sounds normal.  Musculoskeletal: He exhibits no edema.  Lymphadenopathy:    He has no cervical adenopathy.  Neurological: He is alert.  Psychiatric: His behavior is normal.  Vitals reviewed.         Assessment & Plan:  She has atrial fibrillation is on anticoagulant Having a tooth cut out next week He may stop the anticoagulant 2 days beforehand and restart it the day after the procedure as long as he is not having any bleeding There is a theoretical risk of this potential for stroke but he must have this abscess tooth cut out  Patient denies being depressed

## 2016-06-14 ENCOUNTER — Encounter (HOSPITAL_COMMUNITY): Payer: Self-pay | Admitting: Physical Therapy

## 2016-06-14 ENCOUNTER — Ambulatory Visit (HOSPITAL_COMMUNITY): Payer: 59 | Admitting: Physical Therapy

## 2016-06-14 DIAGNOSIS — R2681 Unsteadiness on feet: Secondary | ICD-10-CM | POA: Diagnosis not present

## 2016-06-14 DIAGNOSIS — R29898 Other symptoms and signs involving the musculoskeletal system: Secondary | ICD-10-CM | POA: Diagnosis not present

## 2016-06-14 DIAGNOSIS — M6281 Muscle weakness (generalized): Secondary | ICD-10-CM | POA: Diagnosis not present

## 2016-06-14 DIAGNOSIS — R471 Dysarthria and anarthria: Secondary | ICD-10-CM | POA: Diagnosis not present

## 2016-06-14 DIAGNOSIS — R278 Other lack of coordination: Secondary | ICD-10-CM | POA: Diagnosis not present

## 2016-06-14 DIAGNOSIS — R2689 Other abnormalities of gait and mobility: Secondary | ICD-10-CM | POA: Diagnosis not present

## 2016-06-14 DIAGNOSIS — R41841 Cognitive communication deficit: Secondary | ICD-10-CM | POA: Diagnosis not present

## 2016-06-14 LAB — CUP PACEART REMOTE DEVICE CHECK
Implantable Pulse Generator Implant Date: 20161229
MDC IDC SESS DTM: 20171224164122

## 2016-06-14 NOTE — Therapy (Signed)
[place], [place], [address] Phone: [phone]   Fax:  [phone]  Physical Therapy Treatment  Patient Details  Name: Travis [name] MRN: [id number] Date of Birth: Feb 12, 1961 Referring Provider: [name] xu   Encounter Date: 06/14/2016      PT End of Session - 06/14/16 1305    Visit Number 5   Number of Visits 8   Date for PT Re-Evaluation 06/21/16   Authorization Type [name]    Authorization Time Period 05/24/16 to 06/24/16   PT Start Time 1302   PT Stop Time 1345   PT Time Calculation (min) 43 min   Equipment Utilized During Treatment Gait belt   Activity Tolerance Patient tolerated treatment well   Behavior During Therapy [facility] for tasks assessed/performed      Past Medical History:  Diagnosis Date  . A-fib ([place]) 02/2016   found on loop recorder  . 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 Anxiety   . Arthritis   . Blood transfusion without reported diagnosis   . Chronic headaches   . Complication of anesthesia    bleed after last shoulder-had to stay overnight  . Depression   . Diabetes mellitus   . Diabetic peripheral neuropathy ([place]) 03/28/2013  . Diverticulosis   . Dizziness   . Hernia, inguinal   . Hyperlipidemia   . Hypertension   . Impingement syndrome of left shoulder 08/02/2014  . 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 Neuropathy of lower extremity   . Night sweats    every once in a while  . Sleep apnea    no CPAP  . Stroke ([place]) 02/2015  . Syncope and collapse     Past Surgical History:  Procedure Laterality Date  . CERVICAL DISC SURGERY    . COLON SURGERY  2003   1/3 removed for diverticulitis  . COLONOSCOPY    . EP IMPLANTABLE DEVICE N/A 05/01/2015   Procedure: Loop Recorder Insertion;  Surgeon: [name], MD;  Location: [facility];  Service: Cardiovascular;  Laterality: N/A;  . INGUINAL HERNIA REPAIR Bilateral   . KNEE ARTHROSCOPY Right   .  SHOULDER ARTHROSCOPY Right    1  . SHOULDER ARTHROSCOPY Left 08/02/2014   Procedure: LEFT SHOULDER SCOPE DEBRIDEMENT/ACROMIOPLASTY;  Surgeon: [name], MD;  Location: [facility];  Service: Orthopedics;  Laterality: Left;  ANESTHESIA: GENERAL, PRE/POST OP SCALENE  . TEE WITHOUT CARDIOVERSION N/A 03/03/2015   Procedure: TRANSESOPHAGEAL ECHOCARDIOGRAM (TEE);  Surgeon: [name], MD;  [facility];  Service: Cardiology;  Laterality: N/A;  . UMBILICAL HERNIA REPAIR     with other hernia repair with mesh  . WRIST SURGERY Right    fusion    There were no vitals filed for this visit.      Subjective Assessment - 06/14/16 1304    Subjective Pt reports that "everything is as to be expected." He denies any falls or issues over the weekend.   Pertinent History history of multiple CVAs, patent foramen ovale, A-fib, DM with neuropathy, anxiety and depression, vertigo    Patient Stated Goals "get better", get endurance back up   Currently in Pain? Yes   Pain Score 7    Pain Location Shoulder   Pain Orientation Right   Pain Descriptors / Indicators Aching  Balance Exercises - 06/14/16 1306      Balance Exercises: Standing   Tandem Stance Foam/compliant surface;10 secs;5 reps;Eyes closed  EO on foam and trunk rotations with 4# weight bar x 5 reps   SLS Eyes open;5 reps;10 secs   Stepping Strategy Anterior;Posterior;5 reps   Other Standing Exercises bil tandem stance on foam with cross body reaching at numbers board x 15 mins total           PT Education - 06/14/16 1554    Education provided Yes   Education Details exercise technique; ensure balance before performing reaching activities during exercises   Person(s) Educated Patient   Methods Explanation   Comprehension Verbalized understanding          PT Short Term Goals - 05/24/16 1713      PT SHORT TERM GOAL #1   Title Patient to be able  to list 5/5 safety precautions in order to show improved safety awareness and reduced risk of fall    Time 2   Period Weeks   Status New     PT SHORT TERM GOAL #2   Title Patient to be able to state the importance of regularly using appropriate assistive device in order to prevent fall in order to show improved safety awareness/reduced fall risk    Time 2   Period Weeks   Status New     PT SHORT TERM GOAL #3   Title Patient to be independent in correctly and consistently performing appropriate HEP, to be progressed PRN    Time 1   Period Weeks   Status New           PT Long Term Goals - 05/24/16 1714      PT LONG TERM GOAL #1   Title Patient to demonstrate functional strength 5/5 in order to improve balance and mobility    Time 4   Period Weeks   Status New     PT LONG TERM GOAL #2   Title Patient to score at least 49 on BERG balance test in order to show improved general safety and reduced fall risk    Time 4   Period Weeks   Status New     PT LONG TERM GOAL #3   Title Patient to score no less than 19 on DGI in order to show balance adequate enough to assist in preventing falls in dynamic situations   Time 4   Period Weeks   Status New               Plan - 06/14/16 1555    Clinical Impression Statement Pt continuing to make progress towards goals as demonstrated by ability to perform dynamic balance activities. Pt still requires min A intermittently to maintain balance but overall he requires SUPV to CGA. Pt has most difficulty with NBOS and reaching outside BOS in both directions.   Rehab Potential Good   Clinical Impairments Affecting Rehab Potential (+) pleasant and appears motivated to participate in skilled PT services; (-) multiple co-morbidities including history of multiple CVAs    PT Frequency 2x / week   PT Duration 4 weeks   PT Treatment/Interventions ADLs/Self Care Home Management;Biofeedback;DME Instruction;Gait training;Stair training;Functional  mobility training;Therapeutic activities;Therapeutic exercise;Balance training;Neuromuscular re-education;Patient/family education;Manual techniques;Energy conservation;Taping   PT Next Visit Plan Primary focus is on BALANCE. Increase difficulty of tasks next session by adding more foam based tasks.    PT Home Exercise Plan 1/25: SLS, tandem stance, standing marches  Consulted and Agree with Plan of Care Patient      Patient will benefit from skilled therapeutic intervention in order to improve the following deficits and impairments:  Abnormal gait, Improper body mechanics, Decreased coordination, Decreased activity tolerance, Decreased strength, Decreased balance, Difficulty walking, Decreased safety awareness  Visit Diagnosis: Other symptoms and signs involving the musculoskeletal system  Other lack of coordination  Muscle weakness (generalized)     Problem List Patient Active Problem List   Diagnosis Date Noted  . Generalized anxiety disorder 05/15/2016  . Acute bronchiolitis due to respiratory syncytial virus (RSV)   . Acute CVA (cerebrovascular accident) ([place]) 05/04/2016  . Dizziness 05/02/2016  . Anxiety and depression 05/02/2016  . Paroxysmal atrial fibrillation ([place]) 03/02/2016  . PFO (patent foramen ovale) 07/24/2015  . Chronic deep vein thrombosis (DVT) of popliteal vein of left lower extremity ([place]) 07/24/2015  . OSA (obstructive sleep apnea) 04/18/2015  . 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 artery ([place]) 04/18/2015  . Cerebellar stroke ([place]) 02/28/2015  . Snoring 01/23/2015  . Degeneration of cervical intervertebral disc 01/23/2015  . Essential hypertension 12/14/2014  . Diabetes mellitus type II, controlled ([place]) 12/14/2014  . HLD (hyperlipidemia) 12/14/2014  . Neck pain 12/14/2014  . Cerebral infarction, chronic   . Vertigo 10/14/2014  . 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 Impingement syndrome of left shoulder 08/02/2014  . Adhesive  capsulitis of left shoulder 08/02/2014  . Abdominal pain 06/13/2014  . Rectal bleeding 03/13/2014  . Diabetes type 2, controlled ([place]) 02/18/2014  . Diabetic peripheral neuropathy ([place]) 03/28/2013  . Irreducible incisional hernia 11/11/2010    Geraldine Solar PT, DPT   [place] [address] [place], [place], [address] Phone: [phone]   Fax:  [phone]  Name: Travis [name] MRN: [id number] Date of Birth: 07/22/1960

## 2016-06-17 ENCOUNTER — Ambulatory Visit (HOSPITAL_COMMUNITY): Payer: 59 | Admitting: Physical Therapy

## 2016-06-17 ENCOUNTER — Ambulatory Visit (HOSPITAL_COMMUNITY): Payer: 59

## 2016-06-22 ENCOUNTER — Encounter (HOSPITAL_COMMUNITY): Payer: Self-pay

## 2016-06-22 ENCOUNTER — Ambulatory Visit (HOSPITAL_COMMUNITY): Payer: 59 | Admitting: Physical Therapy

## 2016-06-22 ENCOUNTER — Encounter (HOSPITAL_COMMUNITY): Payer: Self-pay | Admitting: Physical Therapy

## 2016-06-22 ENCOUNTER — Ambulatory Visit (HOSPITAL_COMMUNITY): Payer: 59

## 2016-06-22 DIAGNOSIS — R471 Dysarthria and anarthria: Secondary | ICD-10-CM | POA: Diagnosis not present

## 2016-06-22 DIAGNOSIS — R29898 Other symptoms and signs involving the musculoskeletal system: Secondary | ICD-10-CM

## 2016-06-22 DIAGNOSIS — R278 Other lack of coordination: Secondary | ICD-10-CM

## 2016-06-22 DIAGNOSIS — M6281 Muscle weakness (generalized): Secondary | ICD-10-CM

## 2016-06-22 DIAGNOSIS — R2681 Unsteadiness on feet: Secondary | ICD-10-CM | POA: Diagnosis not present

## 2016-06-22 DIAGNOSIS — R2689 Other abnormalities of gait and mobility: Secondary | ICD-10-CM

## 2016-06-22 DIAGNOSIS — R41841 Cognitive communication deficit: Secondary | ICD-10-CM | POA: Diagnosis not present

## 2016-06-22 NOTE — Patient Instructions (Signed)
  STRAIGHT LEG RAISE - SLR  While lying on your back, raise up your leg with a straight knee.  Keep the opposite knee bent with the foot planted on the ground.   BRIDGING  While lying on your back, tighten your lower abdominals, squeeze your buttocks and then raise your buttocks off the floor/bed as creating a "Bridge" with your body. Hold and then lower yourself and repeat.   SINGLE LEG BRIDGE  While lying on your back with your knees bent, extend one knee as shown.   Next, raise your buttocks off the floor/bed.    Try and maintain your pelvis level the entire time.    SIT TO STAND - NO SUPPORT  Start by scooting close to the front of the chair.  Next, lean forward at your trunk and reach forward with your arms and rise to standing without using your hands to push off from the chair or other object.   Use your arms as a counter-balance by reaching forward when in sitting and lower them as you approach standing.   If this gets easy, take 5-10 seconds to sit down to increase difficulty.    Perform 1x/day, 3 sets of 10 reps of each exercises while also continuing to perform your balance activities

## 2016-06-22 NOTE — Therapy (Signed)
[place], [place], [address] Phone: [phone]   Fax:  [phone]  Physical Therapy Treatment (reassessment/discharge summary)  Patient Details  Name: Travis [name] MRN: [id number] Date of Birth: 07-Jul-1960 Referring Provider: [name] xu   Encounter Date: 06/22/2016      PT End of Session - 06/22/16 1436    Visit Number 6   Number of Visits 8   Date for PT Re-Evaluation 06/21/16   Authorization Type [name]    Authorization Time Period 05/24/16 to 06/24/16   PT Start Time 1432   PT Stop Time 1515   PT Time Calculation (min) 43 min   Equipment Utilized During Treatment Gait belt   Activity Tolerance Patient tolerated treatment well   Behavior During Therapy [facility] for tasks assessed/performed      Past Medical History:  Diagnosis Date  . A-fib ([place]) 02/2016   found on loop recorder  . 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 Anxiety   . Arthritis   . Blood transfusion without reported diagnosis   . Chronic headaches   . Complication of anesthesia    bleed after last shoulder-had to stay overnight  . Depression   . Diabetes mellitus   . Diabetic peripheral neuropathy ([place]) 03/28/2013  . Diverticulosis   . Dizziness   . Hernia, inguinal   . Hyperlipidemia   . Hypertension   . Impingement syndrome of left shoulder 08/02/2014  . 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 Neuropathy of lower extremity   . Night sweats    every once in a while  . Sleep apnea    no CPAP  . Stroke ([place]) 02/2015  . Syncope and collapse     Past Surgical History:  Procedure Laterality Date  . CERVICAL DISC SURGERY    . COLON SURGERY  2003   1/3 removed for diverticulitis  . COLONOSCOPY    . EP IMPLANTABLE DEVICE N/A 05/01/2015   Procedure: Loop Recorder Insertion;  Surgeon: [name], MD;  Location: [facility];  Service: Cardiovascular;  Laterality: N/A;  . INGUINAL HERNIA REPAIR Bilateral   .  KNEE ARTHROSCOPY Right   . SHOULDER ARTHROSCOPY Right    1  . SHOULDER ARTHROSCOPY Left 08/02/2014   Procedure: LEFT SHOULDER SCOPE DEBRIDEMENT/ACROMIOPLASTY;  Surgeon: [name], MD;  Location: [facility];  Service: Orthopedics;  Laterality: Left;  ANESTHESIA: GENERAL, PRE/POST OP SCALENE  . TEE WITHOUT CARDIOVERSION N/A 03/03/2015   Procedure: TRANSESOPHAGEAL ECHOCARDIOGRAM (TEE);  Surgeon: [name], MD;  [facility];  Service: Cardiology;  Laterality: N/A;  . UMBILICAL HERNIA REPAIR     with other hernia repair with mesh  . WRIST SURGERY Right    fusion    There were no vitals filed for this visit.      Subjective Assessment - 06/22/16 1434    Subjective Pt states he is slow going today. He reports that he got his tooth pulled which feels much better, but he has felt a little more weak since.   Pertinent History history of multiple CVAs, patent foramen ovale, A-fib, DM with neuropathy, anxiety and depression, vertigo    Patient Stated Goals "get better", get endurance back up   Currently in Pain? Yes   Pain Score 7    Pain Location Shoulder   Pain Orientation Left            [facility] PT Assessment - 06/22/16 1437  Strength   Right Hip Flexion 5/5   Right Hip ABduction 5/5   Left Hip Flexion 4+/5   Left Hip ABduction 4+/5   Right Knee Flexion 5/5   Right Knee Extension 5/5   Left Knee Flexion 5/5   Left Knee Extension 5/5   Right Ankle Dorsiflexion 5/5   Left Ankle Dorsiflexion 5/5     Berg Balance Test   Sit to Stand Able to stand without using hands and stabilize independently   Standing Unsupported Able to stand safely 2 minutes   Sitting with Back Unsupported but Feet Supported on Floor or Stool Able to sit safely and securely 2 minutes   Stand to Sit Sits safely with minimal use of hands   Transfers Able to transfer safely, minor use of hands   Standing Unsupported with Eyes Closed Able to stand 10 seconds with  supervision   Standing Ubsupported with Feet Together Able to place feet together independently and stand 1 minute safely   From Standing, Reach Forward with Outstretched Arm Can reach confidently >25 cm (10")   From Standing Position, Pick up Object from Floor Able to pick up shoe safely and easily   From Standing Position, Turn to Look Behind Over each Shoulder Looks behind from both sides and weight shifts well   Turn 360 Degrees Able to turn 360 degrees safely but slowly   Standing Unsupported, Alternately Place Feet on Step/Stool Able to stand independently and safely and complete 8 steps in 20 seconds   Standing Unsupported, One Foot in Front Able to plae foot ahead of the other independently and hold 30 seconds   Standing on One Leg Able to lift leg independently and hold > 10 seconds   Total Score 52     Dynamic Gait Index   Level Surface Normal   Change in Gait Speed Normal   Gait with Horizontal Head Turns Mild Impairment   Gait with Vertical Head Turns Normal   Gait and Pivot Turn Normal   Step Over Obstacle Normal   Step Around Obstacles Normal   Steps Mild Impairment   Total Score 22             PT Education - 06/22/16 1647    Education provided Yes   Education Details d/c plans, added strengthening to HEP to promote continued improvements in BLE strength   Person(s) Educated Patient   Methods Explanation;Demonstration;Handout   Comprehension Verbalized understanding;Returned demonstration          PT Short Term Goals - 06/22/16 1506      PT SHORT TERM GOAL #1   Title Patient to be able to list 5/5 safety precautions in order to show improved safety awareness and reduced risk of fall    Time 2   Period Weeks   Status Achieved     PT SHORT TERM GOAL #2   Title Patient to be able to state the importance of regularly using appropriate assistive device in order to prevent fall in order to show improved safety awareness/reduced fall risk    Time 2   Period  Weeks   Status Achieved     PT SHORT TERM GOAL #3   Title Patient to be independent in correctly and consistently performing appropriate HEP, to be progressed PRN    Time 1   Period Weeks   Status On-going           PT Long Term Goals - 06/22/16 1509      PT  LONG TERM GOAL #1   Title Patient to demonstrate functional strength 5/5 in order to improve balance and mobility    Time 4   Period Weeks   Status On-going     PT LONG TERM GOAL #2   Title Patient to score at least 49 on BERG balance test in order to show improved general safety and reduced fall risk    Time 4   Period Weeks   Status Achieved     PT LONG TERM GOAL #3   Title Patient to score no less than 19 on DGI in order to show balance adequate enough to assist in preventing falls in dynamic situations   Time 4   Period Weeks   Status Achieved               Plan - 06/22/16 1648    Clinical Impression Statement PT reassessed pt's outcome measures and goals this date. Pt has made significant improvements in all areas as demonstrated by his improved BERG, DGI, and overall BLE strength. Pt has met all but 2 goals; he is still slightly limited in his hip strength and her verbalized partial compliance with HEP. Pt could not put a % on his self-reported improvements, but did state that he can tell a difference. Due to progress made, pt will be discharged to an updated HEP to maintain progress made in therapy.   Rehab Potential Good   Clinical Impairments Affecting Rehab Potential (+) pleasant and appears motivated to participate in skilled PT services; (-) multiple co-morbidities including history of multiple CVAs    PT Frequency 2x / week   PT Duration 4 weeks   PT Treatment/Interventions ADLs/Self Care Home Management;Biofeedback;DME Instruction;Gait training;Stair training;Functional mobility training;Therapeutic activities;Therapeutic exercise;Balance training;Neuromuscular re-education;Patient/family  education;Manual techniques;Energy conservation;Taping   PT Next Visit Plan --   PT Home Exercise Plan 1/25: SLS, tandem stance, standing marches; 2/20: sit <> stand with no UE support progressing to eccentric sit <> stand, bridging progressing to SL bridging, and SLR   Consulted and Agree with Plan of Care Patient      Patient will benefit from skilled therapeutic intervention in order to improve the following deficits and impairments:  Abnormal gait, Improper body mechanics, Decreased coordination, Decreased activity tolerance, Decreased strength, Decreased balance, Difficulty walking, Decreased safety awareness  Visit Diagnosis: Other symptoms and signs involving the musculoskeletal system  Other lack of coordination  Muscle weakness (generalized)  Other abnormalities of gait and mobility     Problem List Patient Active Problem List   Diagnosis Date Noted  . Generalized anxiety disorder 05/15/2016  . Acute bronchiolitis due to respiratory syncytial virus (RSV)   . Acute CVA (cerebrovascular accident) ([place]) 05/04/2016  . Dizziness 05/02/2016  . Anxiety and depression 05/02/2016  . Paroxysmal atrial fibrillation ([place]) 03/02/2016  . PFO (patent foramen ovale) 07/24/2015  . Chronic deep vein thrombosis (DVT) of popliteal vein of left lower extremity ([place]) 07/24/2015  . OSA (obstructive sleep apnea) 04/18/2015  . 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 artery ([place]) 04/18/2015  . Cerebellar stroke ([place]) 02/28/2015  . Snoring 01/23/2015  . Degeneration of cervical intervertebral disc 01/23/2015  . Essential hypertension 12/14/2014  . Diabetes mellitus type II, controlled ([place]) 12/14/2014  . HLD (hyperlipidemia) 12/14/2014  . Neck pain 12/14/2014  . Cerebral infarction, chronic   . Vertigo 10/14/2014  . 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 Impingement syndrome of left shoulder 08/02/2014  . 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 Abdominal pain  06/13/2014  . Rectal bleeding 03/13/2014  . Diabetes type 2, controlled ([place]) 02/18/2014  . Diabetic peripheral neuropathy ([place]) 03/28/2013  . Irreducible incisional hernia 11/11/2010     PHYSICAL THERAPY DISCHARGE SUMMARY  Visits from Start of Care: 6  Current functional level related to goals / functional outcomes: Pt has only mild impairments in BLE strength and scored in normal ranges for the BERG and DGI. See above for more details.   Remaining deficits: Minor hip weakness   Education / Equipment: Updated HEP Plan: Patient agrees to discharge.  Patient goals were met. Patient is being discharged due to meeting the stated rehab goals.  ?????       Geraldine Solar PT, [place] [address] [place], [place], [address] Phone: [phone]   Fax:  [phone]  Name: Travis [name] MRN: [id number] Date of Birth: 1961-02-01

## 2016-06-22 NOTE — Therapy (Signed)
[place], [place], [address] Phone: [phone]   Fax:  [phone]  Occupational Therapy Treatment And mini reassessment Patient Details  Name: Travis [name] MRN: [id number] Date of Birth: May 02, 1961 Referring Provider: Dr. Rosalin Hawking  Encounter Date: 06/22/2016      OT End of Session - 06/22/16 1644    Visit Number 5   Number of Visits 16   Date for OT Re-Evaluation 07/16/16   Authorization Type [name]    OT Start Time 1345   OT Stop Time 1430   OT Time Calculation (min) 45 min   Activity Tolerance Patient tolerated treatment well   Behavior During Therapy [facility] for tasks assessed/performed      Past Medical History:  Diagnosis Date  . A-fib ([place]) 02/2016   found on loop recorder  . 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 Anxiety   . Arthritis   . Blood transfusion without reported diagnosis   . Chronic headaches   . Complication of anesthesia    bleed after last shoulder-had to stay overnight  . Depression   . Diabetes mellitus   . Diabetic peripheral neuropathy ([place]) 03/28/2013  . Diverticulosis   . Dizziness   . Hernia, inguinal   . Hyperlipidemia   . Hypertension   . Impingement syndrome of left shoulder 08/02/2014  . 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 Neuropathy of lower extremity   . Night sweats    every once in a while  . Sleep apnea    no CPAP  . Stroke ([place]) 02/2015  . Syncope and collapse     Past Surgical History:  Procedure Laterality Date  . CERVICAL DISC SURGERY    . COLON SURGERY  2003   1/3 removed for diverticulitis  . COLONOSCOPY    . EP IMPLANTABLE DEVICE N/A 05/01/2015   Procedure: Loop Recorder Insertion;  Surgeon: [name], MD;  Location: [facility];  Service: Cardiovascular;  Laterality: N/A;  . INGUINAL HERNIA REPAIR Bilateral   . KNEE ARTHROSCOPY Right   . SHOULDER ARTHROSCOPY Right    1  . SHOULDER ARTHROSCOPY Left 08/02/2014   Procedure: LEFT SHOULDER SCOPE DEBRIDEMENT/ACROMIOPLASTY;  Surgeon: [name], MD;  Location: [facility];  Service: Orthopedics;  Laterality: Left;  ANESTHESIA: GENERAL, PRE/POST OP SCALENE  . TEE WITHOUT CARDIOVERSION N/A 03/03/2015   Procedure: TRANSESOPHAGEAL ECHOCARDIOGRAM (TEE);  Surgeon: [name], MD;  [facility];  Service: Cardiology;  Laterality: N/A;  . UMBILICAL HERNIA REPAIR     with other hernia repair with mesh  . WRIST SURGERY Right    fusion    There were no vitals filed for this visit.      Subjective Assessment - 06/22/16 1418    Subjective  S: I've had issues with a tooth this whole time. I haven't been participating in my therapy like I should have because I haven't felt good.   Currently in Pain? Yes   Pain Score 7    Pain Location Shoulder   Pain Orientation Left   Pain Descriptors / Indicators Burning   Pain Type Acute pain   Pain Radiating Towards Down arm to elbow with certain movements   Pain Onset More than a month ago   Pain Frequency Intermittent   Aggravating Factors  Certain movements   Pain Relieving Factors Ice   Effect of Pain on Daily Activities min effect   Multiple Pain Sites No  [facility] OT Assessment - 06/22/16 1357      Assessment   Diagnosis LUE Weakness S/P CVA   Onset Date 05/02/16     Precautions   Precautions None     Prior Function   Level of Independence Independent   Vocation On disability     Strength   Overall Strength Comments RUE mid and lower trapezius strength is 4/5. On eval, shoulder, elbow, and wrist was assessed at 4-/5. Elbow flexion is 5/5 and elbow extension is 4/5   Strength Assessment Site Shoulder   Right/Left Shoulder Left   Left Shoulder Flexion 5/5   Left Shoulder ABduction 4/5   Left Shoulder Internal Rotation 5/5   Left Shoulder External Rotation 5/5   Right/Left hand Left     Hand Function   Left Hand Grip (lbs) 55  previous: 58   Left Hand  Lateral Pinch 14 lbs  previous: 16   Left 3 point pinch 12 lbs  previous: 14                  OT Treatments/Exercises (OP) - 06/22/16 1422      Exercises   Exercises Shoulder     Shoulder Exercises: Prone   Other Prone Exercises Completed middle and lower trapezius strengthening exercises with 3#; 12X                  OT Short Term Goals - 06/22/16 1403      OT SHORT TERM GOAL #1   Title Pt will be educated on and independent in HEP.    Time 4   Period Weeks   Status Achieved     OT SHORT TERM GOAL #2   Title Patient will increase left upper extremity strength to 4/5 for improved ability to complete yard work.   Time 4   Period Weeks   Status On-going     OT SHORT TERM GOAL #3   Title Pt will increase LUE grip strength by 10# to increase ability to hold onto 2 liter drink bottles.    Time 4   Period Weeks   Status On-going     OT SHORT TERM GOAL #4   Title Patient will increase left hand pinch strength by 2 pounds for improved ability to manipulate household items.    Time 4   Period Weeks   Status On-going     OT SHORT TERM GOAL #5   Title Patient will increase sustained activity tolerance by 25% for improved ability to complete daily activities.    Time 4   Period Weeks   Status On-going           OT Long Term Goals - 05/27/16 1607      OT LONG TERM GOAL #1   Title Patient will return to highest level of independence with all daily and leisure tasks.    Time 8   Period Weeks   Status On-going     OT LONG TERM GOAL #2   Title Patient will demonstrate 4+/5 strength in his LUE for improved ability to particpate in yard work.   Time 8   Period Weeks   Status On-going     OT LONG TERM GOAL #3   Title Patient will improve left grip strength by 20# and pinch strength by 4# for improved ability to open containers and manipulate objects.    Time 8   Period Months   Status On-going     OT LONG TERM GOAL #4  Title Patient will  improve sustained activity tolerance by 50% for increased independence with all daily activities.    Time 8   Period Weeks   Status On-going     OT LONG TERM GOAL #5   Title Patient will complete in hand manipulation activities without difficulty.    Time 8   Period Weeks   Status On-going     OT LONG TERM GOAL #6   Title Patient will be educated on energy conservation and work simplification principles and independently apply to daily tasks for improved safety.    Time 8   Period Weeks   Status On-going               Plan - 06/22/16 1645    Clinical Impression Statement A: Mini reassessment completed this date. 1 short term goal completed this date. patient reports that he has been battling a tooth that needed to be pulled for a long time which has been impeding his progression in therapy as he has not been able to participate as fully as he would have liked. Patient has shown some improvement this session also it is 50/50. Patient has actually tested weaker with his grip and pinch strength. Middle and lower trapezius on the left side is showing weakeness versus rotator cuff. Recommend that patient continue for 4 weeks and if no improvements have been made we will discharge.    Plan P: Focus on primarily: grip and pinch strength and middle and lower left trapezius muscle. Continue for 4 more weeks and reassess. Update HEP to focus on middle and lower trapezius strength. Remove unneeded excerises.      Patient will benefit from skilled therapeutic intervention in order to improve the following deficits and impairments:  Decreased coordination, Decreased endurance, Decreased strength, Impaired UE functional use  Visit Diagnosis: Other symptoms and signs involving the musculoskeletal system  Other lack of coordination    Problem List Patient Active Problem List   Diagnosis Date Noted  . Generalized anxiety disorder 05/15/2016  . Acute bronchiolitis due to respiratory  syncytial virus (RSV)   . Acute CVA (cerebrovascular accident) ([place]) 05/04/2016  . Dizziness 05/02/2016  . Anxiety and depression 05/02/2016  . Paroxysmal atrial fibrillation ([place]) 03/02/2016  . PFO (patent foramen ovale) 07/24/2015  . Chronic deep vein thrombosis (DVT) of popliteal vein of left lower extremity ([place]) 07/24/2015  . OSA (obstructive sleep apnea) 04/18/2015  . 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 artery ([place]) 04/18/2015  . Cerebellar stroke ([place]) 02/28/2015  . Snoring 01/23/2015  . Degeneration of cervical intervertebral disc 01/23/2015  . Essential hypertension 12/14/2014  . Diabetes mellitus type II, controlled ([place]) 12/14/2014  . HLD (hyperlipidemia) 12/14/2014  . Neck pain 12/14/2014  . Cerebral infarction, chronic   . Vertigo 10/14/2014  . 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 Impingement syndrome of left shoulder 08/02/2014  . 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 Abdominal pain 06/13/2014  . Rectal bleeding 03/13/2014  . Diabetes type 2, controlled ([place]) 02/18/2014  . Diabetic peripheral neuropathy ([place]) 03/28/2013  . Irreducible incisional hernia 11/11/2010   Ailene Ravel, OTR/L,CBIS  [phone]  06/22/2016, 4:50 PM  [facility] [address] [place], [place], [address] Phone: [phone]   Fax:  [phone]  Name: Travis [name] MRN: [id number] Date of Birth: [birth year]

## 2016-06-23 ENCOUNTER — Other Ambulatory Visit: Payer: Self-pay | Admitting: Family Medicine

## 2016-06-24 ENCOUNTER — Ambulatory Visit (INDEPENDENT_AMBULATORY_CARE_PROVIDER_SITE_OTHER): Payer: 59 | Admitting: *Deleted

## 2016-06-24 ENCOUNTER — Ambulatory Visit (HOSPITAL_COMMUNITY): Payer: 59 | Admitting: Physical Therapy

## 2016-06-24 DIAGNOSIS — I63443 Cerebral infarction due to embolism of bilateral cerebellar arteries: Secondary | ICD-10-CM | POA: Diagnosis not present

## 2016-06-24 MED FILL — MECLIZINE 25 MG TABLET: 25 | 10 days supply | Qty: 30 | Fill #0

## 2016-06-24 NOTE — Progress Notes (Signed)
[facility] Summary Report / Loop Recorder 

## 2016-06-27 LAB — CUP PACEART REMOTE DEVICE CHECK
Implantable Pulse Generator Implant Date: 20161229
MDC IDC SESS DTM: 20180123171000

## 2016-06-29 ENCOUNTER — Ambulatory Visit (HOSPITAL_COMMUNITY): Payer: 59 | Admitting: Specialist

## 2016-06-29 ENCOUNTER — Telehealth (HOSPITAL_COMMUNITY): Payer: Self-pay | Admitting: Family Medicine

## 2016-06-29 NOTE — Telephone Encounter (Signed)
06/29/16 wife called to cx - she didn't give a reason

## 2016-06-30 MED FILL — ELIQUIS 5 MG TABLET: 5 | 30 days supply | Qty: 60 | Fill #2

## 2016-06-30 MED FILL — FOLIC ACID 1 MG TABLET: 1 | 90 days supply | Qty: 90 | Fill #1

## 2016-07-01 ENCOUNTER — Ambulatory Visit (HOSPITAL_COMMUNITY): Payer: 59 | Attending: Neurology

## 2016-07-01 DIAGNOSIS — R29898 Other symptoms and signs involving the musculoskeletal system: Secondary | ICD-10-CM | POA: Diagnosis not present

## 2016-07-01 DIAGNOSIS — R278 Other lack of coordination: Secondary | ICD-10-CM | POA: Insufficient documentation

## 2016-07-01 NOTE — Patient Instructions (Addendum)
With 3 lb. weight complete the following exercises for 12 repetitions.  Without weight complete the following exercises for 20 repetitions.  Prone Shoulder Scaption  Prone Scaption  Hold 3# weight and raise arm out to 45 degrees.    PRONE RETRACTION  Lying face down with your elbows straight, slowly draw your shoulder blade back towards your spine. Your whole arm should raise including your shoulder blade upward as shown. Your elbow should be straight the entire time.       PRONE T - THUMB UP  Lie face down with your elbow straight and arm dangling down towards the floor. Next, set your scapula by retracting it towards your spine and downward towards your feet. Then, slowly raise your arm keeping your elbow straight the entire time as shown.  Your thumb should be pointed in the upward direction as your arm raises.      PRONE T  - PALM DOWN  Lie face down with your elbow straight and arm dangling down towards the floor. Next, set your scapula by retracting it towards your spine and downward towards your feet. Then, slowly raise your arm keeping your elbow straight the entire time as shown.  Your palm should be directed downward as your arm raises.     Prone T's Bent  Prone Scapular Retraction - T's Bent Elbows Holding hand weight.  Lying face-down with your elbows bent at your sides, bring your arms up by squeezing your shoulder blades together. Do not let your shoulders ride up to your ears, by engaging the muscles between your shoulder blades.

## 2016-07-02 NOTE — Therapy (Signed)
[place], [place], [address] Phone: [phone]   Fax:  [phone]  Occupational Therapy Treatment  Patient Details  Name: Travis [name] MRN: [id number] Date of Birth: Sep 17, 1960 Referring Provider: Dr. Rosalin Hawking  Encounter Date: 07/01/2016      OT End of Session - 07/01/16 0739    Visit Number 6   Number of Visits 16   Date for OT Re-Evaluation 07/16/16   Authorization Type [place] [employer]    OT Start Time 1440   OT Stop Time 1515   OT Time Calculation (min) 35 min   Activity Tolerance Patient tolerated treatment well   Behavior During Therapy [facility] for tasks assessed/performed      Past Medical History:  Diagnosis Date  . A-fib ([place]) 02/2016   found on loop recorder  . 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 Anxiety   . Arthritis   . Blood transfusion without reported diagnosis   . Chronic headaches   . Complication of anesthesia    bleed after last shoulder-had to stay overnight  . Depression   . Diabetes mellitus   . Diabetic peripheral neuropathy ([place]) 03/28/2013  . Diverticulosis   . Dizziness   . Hernia, inguinal   . Hyperlipidemia   . Hypertension   . Impingement syndrome of left shoulder 08/02/2014  . 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 Neuropathy of lower extremity   . Night sweats    every once in a while  . Sleep apnea    no CPAP  . Stroke ([place]) 02/2015  . Syncope and collapse     Past Surgical History:  Procedure Laterality Date  . CERVICAL DISC SURGERY    . COLON SURGERY  2003   1/3 removed for diverticulitis  . COLONOSCOPY    . EP IMPLANTABLE DEVICE N/A 05/01/2015   Procedure: Loop Recorder Insertion;  Surgeon: [name], MD;  Location: [facility];  Service: Cardiovascular;  Laterality: N/A;  . INGUINAL HERNIA REPAIR Bilateral   . KNEE ARTHROSCOPY Right   . SHOULDER ARTHROSCOPY Right    1  . SHOULDER ARTHROSCOPY Left 08/02/2014   Procedure: LEFT  SHOULDER SCOPE DEBRIDEMENT/ACROMIOPLASTY;  Surgeon: [name], MD;  Location: [facility];  Service: Orthopedics;  Laterality: Left;  ANESTHESIA: GENERAL, PRE/POST OP SCALENE  . TEE WITHOUT CARDIOVERSION N/A 03/03/2015   Procedure: TRANSESOPHAGEAL ECHOCARDIOGRAM (TEE);  Surgeon: [name], MD;  [facility];  Service: Cardiology;  Laterality: N/A;  . UMBILICAL HERNIA REPAIR     with other hernia repair with mesh  . WRIST SURGERY Right    fusion    There were no vitals filed for this visit.      Subjective Assessment - 07/01/16 1440    Subjective  S: If we tested me today I would be a whole lot better than I was last time; but we won't do that today.   Currently in Pain? Yes   Pain Score 7    Pain Location Shoulder   Pain Orientation Right;Left   Pain Descriptors / Indicators Sore   Pain Type Acute pain   Pain Radiating Towards N/A   Pain Onset Other (comment)  Unknown. Long time   Pain Frequency Constant   Aggravating Factors  Moving fast    Pain Relieving Factors Moving slow   Effect of Pain on Daily Activities Min effect   Multiple Pain Sites No  OT Treatments/Exercises (OP) - 07/01/16 0737      Exercises   Exercises Shoulder;Hand     Shoulder Exercises: Prone   Other Prone Exercises Completed middle and lower trapezius strengthening exercises with 3#; 12X     Hand Exercises   Sponges Pt utilized green resistive clothespin to pick up 30 sponges one at a time with a 3 point pinch.      Fine Motor Coordination   Fine Motor Coordination Grooved pegs   Grooved pegs Pt utilized tweezers to place grooved pegs in board focusing on pinch strength and ability to sustain a pinch for a given amount of time. 2 rows completed due to time constraint. Min difficulty.                 OT Education - 07/02/16 [phone]    Education provided Yes   Education Details Pt was given an updated HEP to now focus  soley on middle and lower trapezius strength.    Person(s) Educated Patient   Methods Explanation;Demonstration;Handout;Verbal cues   Comprehension Returned demonstration;Verbalized understanding          OT Short Term Goals - 07/02/16 0742      OT SHORT TERM GOAL #1   Title Pt will be educated on and independent in HEP.    Time 4   Period Weeks     OT SHORT TERM GOAL #2   Title Patient will increase left upper extremity strength to 4/5 for improved ability to complete yard work.   Time 4   Period Weeks   Status On-going     OT SHORT TERM GOAL #3   Title Pt will increase LUE grip strength by 10# to increase ability to hold onto 2 liter drink bottles.    Time 4   Period Weeks   Status On-going     OT SHORT TERM GOAL #4   Title Patient will increase left hand pinch strength by 2 pounds for improved ability to manipulate household items.    Time 4   Period Weeks   Status On-going     OT SHORT TERM GOAL #5   Title Patient will increase sustained activity tolerance by 25% for improved ability to complete daily activities.    Time 4   Period Weeks   Status On-going           OT Long Term Goals - 05/27/16 1607      OT LONG TERM GOAL #1   Title Patient will return to highest level of independence with all daily and leisure tasks.    Time 8   Period Weeks   Status On-going     OT LONG TERM GOAL #2   Title Patient will demonstrate 4+/5 strength in his LUE for improved ability to particpate in yard work.   Time 8   Period Weeks   Status On-going     OT LONG TERM GOAL #3   Title Patient will improve left grip strength by 20# and pinch strength by 4# for improved ability to open containers and manipulate objects.    Time 8   Period Months   Status On-going     OT LONG TERM GOAL #4   Title Patient will improve sustained activity tolerance by 50% for increased independence with all daily activities.    Time 8   Period Weeks   Status On-going     OT LONG TERM  GOAL #5   Title Patient will complete in hand manipulation activities  without difficulty.    Time 8   Period Weeks   Status On-going     OT LONG TERM GOAL #6   Title Patient will be educated on energy conservation and work simplification principles and independently apply to daily tasks for improved safety.    Time 8   Period Weeks   Status On-going               Plan - 07/02/16 0740    Clinical Impression Statement A: HEP was updated and reviewed with patient with extensive education. VC for form and technique were given as needed. Session ended at table for pinch strengthening task.   Plan P: Continue to focus on grip and pinch strength and middle and lower trapezius strength. Complete gripper task. Follow up on HEP.      Patient will benefit from skilled therapeutic intervention in order to improve the following deficits and impairments:  Decreased coordination, Decreased endurance, Decreased strength, Impaired UE functional use  Visit Diagnosis: Other symptoms and signs involving the musculoskeletal system  Other lack of coordination    Problem List Patient Active Problem List   Diagnosis Date Noted  . Generalized anxiety disorder 05/15/2016  . Acute bronchiolitis due to respiratory syncytial virus (RSV)   . Acute CVA (cerebrovascular accident) ([place]) 05/04/2016  . Dizziness 05/02/2016  . Anxiety and depression 05/02/2016  . Paroxysmal atrial fibrillation ([place]) 03/02/2016  . PFO (patent foramen ovale) 07/24/2015  . Chronic deep vein thrombosis (DVT) of popliteal vein of left lower extremity ([place]) 07/24/2015  . OSA (obstructive sleep apnea) 04/18/2015  . 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 artery ([place]) 04/18/2015  . Cerebellar stroke ([place]) 02/28/2015  . Snoring 01/23/2015  . Degeneration of cervical intervertebral disc 01/23/2015  . Essential hypertension 12/14/2014  . Diabetes mellitus type II, controlled ([place]) 12/14/2014  . HLD  (hyperlipidemia) 12/14/2014  . Neck pain 12/14/2014  . Cerebral infarction, chronic   . Vertigo 10/14/2014  . 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 Impingement syndrome of left shoulder 08/02/2014  . 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 Abdominal pain 06/13/2014  . Rectal bleeding 03/13/2014  . Diabetes type 2, controlled ([place]) 02/18/2014  . Diabetic peripheral neuropathy ([place]) 03/28/2013  . Irreducible incisional hernia 11/11/2010   Ailene Ravel, OTR/L,CBIS  [phone]  07/02/2016, 7:43 AM  [facility] [address] [place], [place], [address] Phone: [phone]   Fax:  [phone]  Name: Travis [name] MRN: [id number] Date of Birth: 20-Sep-1960

## 2016-07-05 MED FILL — TOPIRAMATE 25 MG TABLET: 25 | 30 days supply | Qty: 120 | Fill #7

## 2016-07-06 ENCOUNTER — Encounter (HOSPITAL_COMMUNITY): Payer: Self-pay

## 2016-07-06 ENCOUNTER — Ambulatory Visit (HOSPITAL_COMMUNITY): Payer: 59

## 2016-07-06 DIAGNOSIS — R29898 Other symptoms and signs involving the musculoskeletal system: Secondary | ICD-10-CM | POA: Diagnosis not present

## 2016-07-06 DIAGNOSIS — R278 Other lack of coordination: Secondary | ICD-10-CM | POA: Diagnosis not present

## 2016-07-06 MED FILL — metFORMIN HCL 500 MG TABS: 500 | 90 days supply | Qty: 180 | Fill #0

## 2016-07-06 NOTE — Therapy (Signed)
[place], [place], [address] Phone: [phone]   Fax:  [phone]  Occupational Therapy Treatment  Patient Details  Name: Travis [name] MRN: [id number] Date of Birth: [birth year] Referring Provider: Dr. Rosalin Hawking  Encounter Date: 07/06/2016      OT End of Session - 07/06/16 1607    Visit Number 7   Number of Visits 16   Date for OT Re-Evaluation 07/16/16   Authorization Type [name]    OT Start Time 1438   OT Stop Time 1515   OT Time Calculation (min) 37 min   Activity Tolerance Patient tolerated treatment well   Behavior During Therapy [facility] for tasks assessed/performed      Past Medical History:  Diagnosis Date  . A-fib ([place]) 02/2016   found on loop recorder  . 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 Anxiety   . Arthritis   . Blood transfusion without reported diagnosis   . Chronic headaches   . Complication of anesthesia    bleed after last shoulder-had to stay overnight  . Depression   . Diabetes mellitus   . Diabetic peripheral neuropathy ([place]) 03/28/2013  . Diverticulosis   . Dizziness   . Hernia, inguinal   . Hyperlipidemia   . Hypertension   . Impingement syndrome of left shoulder 08/02/2014  . 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 Neuropathy of lower extremity   . Night sweats    every once in a while  . Sleep apnea    no CPAP  . Stroke ([place]) 02/2015  . Syncope and collapse     Past Surgical History:  Procedure Laterality Date  . CERVICAL DISC SURGERY    . COLON SURGERY  2003   1/3 removed for diverticulitis  . COLONOSCOPY    . EP IMPLANTABLE DEVICE N/A 05/01/2015   Procedure: Loop Recorder Insertion;  Surgeon: [name], MD;  Location: [place] [facility];  Service: Cardiovascular;  Laterality: N/A;  . INGUINAL HERNIA REPAIR Bilateral   . KNEE ARTHROSCOPY Right   . SHOULDER ARTHROSCOPY Right    1  . SHOULDER ARTHROSCOPY Left 08/02/2014   Procedure: LEFT  SHOULDER SCOPE DEBRIDEMENT/ACROMIOPLASTY;  Surgeon: [name], MD;  Location: [place];  Service: Orthopedics;  Laterality: Left;  ANESTHESIA: GENERAL, PRE/POST OP SCALENE  . TEE WITHOUT CARDIOVERSION N/A 03/03/2015   Procedure: TRANSESOPHAGEAL ECHOCARDIOGRAM (TEE);  Surgeon: [name], MD;  [facility];  Service: Cardiology;  Laterality: N/A;  . UMBILICAL HERNIA REPAIR     with other hernia repair with mesh  . WRIST SURGERY Right    fusion    There were no vitals filed for this visit.      Subjective Assessment - 07/06/16 1439    Subjective  S: My left thumb is still hurting from doing my yard work.    Currently in Pain? Yes   Pain Score 9    Pain Location Finger (Comment which one)  thumb   Pain Orientation Left   Pain Descriptors / Indicators Sore   Pain Type Acute pain   Pain Radiating Towards N/A   Pain Onset --  Unknown   Pain Frequency Constant   Aggravating Factors  using tree limb cutters   Pain Relieving Factors Unknown   Effect of Pain on Daily Activities Min-mod effect   Multiple Pain Sites No            [facility] OT Assessment -  07/06/16 1453      Assessment   Diagnosis LUE Weakness S/P CVA     Precautions   Precautions None                  OT Treatments/Exercises (OP) - 07/06/16 1453      Exercises   Exercises Shoulder;Hand     Shoulder Exercises: Standing   Other Standing Exercises T. Y, and A theraband positions; 10X; green band     Hand Exercises   Hand Gripper with Large Beads all beads with gripper set at 55#    Hand Gripper with Medium Beads all beads with gripper set at 55#   Hand Gripper with Small Beads all beads with gripper set at 55#                  OT Short Term Goals - 07/02/16 0742      OT SHORT TERM GOAL #1   Title Pt will be educated on and independent in HEP.    Time 4   Period Weeks     OT SHORT TERM GOAL #2   Title Patient will increase left upper  extremity strength to 4/5 for improved ability to complete yard work.   Time 4   Period Weeks   Status On-going     OT SHORT TERM GOAL #3   Title Pt will increase LUE grip strength by 10# to increase ability to hold onto 2 liter drink bottles.    Time 4   Period Weeks   Status On-going     OT SHORT TERM GOAL #4   Title Patient will increase left hand pinch strength by 2 pounds for improved ability to manipulate household items.    Time 4   Period Weeks   Status On-going     OT SHORT TERM GOAL #5   Title Patient will increase sustained activity tolerance by 25% for improved ability to complete daily activities.    Time 4   Period Weeks   Status On-going           OT Long Term Goals - 05/27/16 1607      OT LONG TERM GOAL #1   Title Patient will return to highest level of independence with all daily and leisure tasks.    Time 8   Period Weeks   Status On-going     OT LONG TERM GOAL #2   Title Patient will demonstrate 4+/5 strength in his LUE for improved ability to particpate in yard work.   Time 8   Period Weeks   Status On-going     OT LONG TERM GOAL #3   Title Patient will improve left grip strength by 20# and pinch strength by 4# for improved ability to open containers and manipulate objects.    Time 8   Period Months   Status On-going     OT LONG TERM GOAL #4   Title Patient will improve sustained activity tolerance by 50% for increased independence with all daily activities.    Time 8   Period Weeks   Status On-going     OT LONG TERM GOAL #5   Title Patient will complete in hand manipulation activities without difficulty.    Time 8   Period Weeks   Status On-going     OT LONG TERM GOAL #6   Title Patient will be educated on energy conservation and work simplification principles and independently apply to daily tasks for improved safety.  Time 8   Period Weeks   Status On-going               Plan - 07/06/16 1607    Clinical Impression  Statement A: Session focused on grip strengthening and trapezius strength and stability. Patient took extra time to complete gripper task requiring frequent rest breaks although he was able to complete with 55# of resistance which is what his grip strength tested at for last reassessment.    Plan P: continue with middle and lower trapezius strengthening.       Patient will benefit from skilled therapeutic intervention in order to improve the following deficits and impairments:  Decreased coordination, Decreased endurance, Decreased strength, Impaired UE functional use  Visit Diagnosis: Other symptoms and signs involving the musculoskeletal system  Other lack of coordination    Problem List Patient Active Problem List   Diagnosis Date Noted  . Generalized anxiety disorder 05/15/2016  . Acute bronchiolitis due to respiratory syncytial virus (RSV)   . Acute CVA (cerebrovascular accident) ([place]) 05/04/2016  . Dizziness 05/02/2016  . Anxiety and depression 05/02/2016  . Paroxysmal atrial fibrillation ([place]) 03/02/2016  . PFO (patent foramen ovale) 07/24/2015  . Chronic deep vein thrombosis (DVT) of popliteal vein of left lower extremity ([place]) 07/24/2015  . OSA (obstructive sleep apnea) 04/18/2015  . 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 artery ([place]) 04/18/2015  . Cerebellar stroke ([place]) 02/28/2015  . Snoring 01/23/2015  . Degeneration of cervical intervertebral disc 01/23/2015  . Essential hypertension 12/14/2014  . Diabetes mellitus type II, controlled ([place]) 12/14/2014  . HLD (hyperlipidemia) 12/14/2014  . Neck pain 12/14/2014  . Cerebral infarction, chronic   . Vertigo 10/14/2014  . 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 Impingement syndrome of left shoulder 08/02/2014  . 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 Abdominal pain 06/13/2014  . Rectal bleeding 03/13/2014  . Diabetes type 2, controlled ([place]) 02/18/2014  . Diabetic peripheral neuropathy ([place])  03/28/2013  . Irreducible incisional hernia 11/11/2010   Ailene Ravel, OTR/L,CBIS  [phone]  07/06/2016, 4:09 PM  [place] [address] [place], [place], [address] Phone: [phone]   Fax:  [phone]  Name: Travis [name] MRN: [id number] Date of Birth: 1961-04-19

## 2016-07-08 ENCOUNTER — Ambulatory Visit (HOSPITAL_COMMUNITY): Payer: 59

## 2016-07-08 DIAGNOSIS — R278 Other lack of coordination: Secondary | ICD-10-CM

## 2016-07-08 DIAGNOSIS — R29898 Other symptoms and signs involving the musculoskeletal system: Secondary | ICD-10-CM

## 2016-07-08 NOTE — Therapy (Signed)
[place], [place], [address] Phone: [phone]   Fax:  [phone]  Occupational Therapy Treatment  Patient Details  Name: Travis [name] MRN: [id number] Date of Birth: 07-Aug-1960 Referring Provider: Dr. Rosalin Hawking  Encounter Date: 07/08/2016      OT End of Session - 07/08/16 1555    Visit Number 8   Number of Visits 16   Date for OT Re-Evaluation 07/16/16   Authorization Type [name]    OT Start Time 1515   OT Stop Time 1600   OT Time Calculation (min) 45 min   Activity Tolerance Patient tolerated treatment well   Behavior During Therapy [facility] for tasks assessed/performed      Past Medical History:  Diagnosis Date  . A-fib ([place]) 02/2016   found on loop recorder  . 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 Anxiety   . Arthritis   . Blood transfusion without reported diagnosis   . Chronic headaches   . Complication of anesthesia    bleed after last shoulder-had to stay overnight  . Depression   . Diabetes mellitus   . Diabetic peripheral neuropathy ([place]) 03/28/2013  . Diverticulosis   . Dizziness   . Hernia, inguinal   . Hyperlipidemia   . Hypertension   . Impingement syndrome of left shoulder 08/02/2014  . 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 Neuropathy of lower extremity   . Night sweats    every once in a while  . Sleep apnea    no CPAP  . Stroke ([place]) 02/2015  . Syncope and collapse     Past Surgical History:  Procedure Laterality Date  . CERVICAL DISC SURGERY    . COLON SURGERY  2003   1/3 removed for diverticulitis  . COLONOSCOPY    . EP IMPLANTABLE DEVICE N/A 05/01/2015   Procedure: Loop Recorder Insertion;  Surgeon: [name], MD;  Location: [place] [facility];  Service: Cardiovascular;  Laterality: N/A;  . INGUINAL HERNIA REPAIR Bilateral   . KNEE ARTHROSCOPY Right   . SHOULDER ARTHROSCOPY Right    1  . SHOULDER ARTHROSCOPY Left 08/02/2014   Procedure: LEFT  SHOULDER SCOPE DEBRIDEMENT/ACROMIOPLASTY;  Surgeon: [name], MD;  Location: [place];  Service: Orthopedics;  Laterality: Left;  ANESTHESIA: GENERAL, PRE/POST OP SCALENE  . TEE WITHOUT CARDIOVERSION N/A 03/03/2015   Procedure: TRANSESOPHAGEAL ECHOCARDIOGRAM (TEE);  Surgeon: [name], MD;  [facility];  Service: Cardiology;  Laterality: N/A;  . UMBILICAL HERNIA REPAIR     with other hernia repair with mesh  . WRIST SURGERY Right    fusion    There were no vitals filed for this visit.      Subjective Assessment - 07/08/16 1554    Subjective  S: My thumb feels better. It's not as sore.    Currently in Pain? Yes   Pain Score 6    Pain Location Shoulder   Pain Orientation Right;Left   Pain Descriptors / Indicators Sore   Pain Type Acute pain                      OT Treatments/Exercises (OP) - 07/08/16 0001      Exercises   Exercises Shoulder;Hand     Shoulder Exercises: Standing   Flexion Theraband;10 reps   Theraband Level (Shoulder Flexion) Level 3 (Green)   Other Standing Exercises T. Y, and A theraband positions; 10X; green band  Shoulder Exercises: ROM/Strengthening   Other ROM/Strengthening Exercises Using parallel bars on an incline patient complete instrinsic plus push-ups 12X, then he completed tricep push-ups 12X.   Other ROM/Strengthening Exercises Using BOSU ball, patient complete 2 30 second hold planks with arms extended. pt took 1.5 minute rest break in between sets.     Hand Exercises   Theraputty Flatten   Theraputty - Flatten green   Other Hand Exercises Pt used pvc pipe to push circles in green theraputty focusing on grip strengthening and wrist strength needed for opening soda bottles.      Fine Motor Coordination   Fine Motor Coordination Dealing card with thumb   Dealing card with thumb pt was able to deal card with his thumb with min difficulty.                   OT Short Term  Goals - 07/02/16 5784      OT SHORT TERM GOAL #1   Title Pt will be educated on and independent in HEP.    Time 4   Period Weeks     OT SHORT TERM GOAL #2   Title Patient will increase left upper extremity strength to 4/5 for improved ability to complete yard work.   Time 4   Period Weeks   Status On-going     OT SHORT TERM GOAL #3   Title Pt will increase LUE grip strength by 10# to increase ability to hold onto 2 liter drink bottles.    Time 4   Period Weeks   Status On-going     OT SHORT TERM GOAL #4   Title Patient will increase left hand pinch strength by 2 pounds for improved ability to manipulate household items.    Time 4   Period Weeks   Status On-going     OT SHORT TERM GOAL #5   Title Patient will increase sustained activity tolerance by 25% for improved ability to complete daily activities.    Time 4   Period Weeks   Status On-going           OT Long Term Goals - 05/27/16 1607      OT LONG TERM GOAL #1   Title Patient will return to highest level of independence with all daily and leisure tasks.    Time 8   Period Weeks   Status On-going     OT LONG TERM GOAL #2   Title Patient will demonstrate 4+/5 strength in his LUE for improved ability to particpate in yard work.   Time 8   Period Weeks   Status On-going     OT LONG TERM GOAL #3   Title Patient will improve left grip strength by 20# and pinch strength by 4# for improved ability to open containers and manipulate objects.    Time 8   Period Months   Status On-going     OT LONG TERM GOAL #4   Title Patient will improve sustained activity tolerance by 50% for increased independence with all daily activities.    Time 8   Period Weeks   Status On-going     OT LONG TERM GOAL #5   Title Patient will complete in hand manipulation activities without difficulty.    Time 8   Period Weeks   Status On-going     OT LONG TERM GOAL #6   Title Patient will be educated on energy conservation and work  simplification principles and independently apply to daily  tasks for improved safety.    Time 8   Period Weeks   Status On-going               Plan - 07/08/16 1727    Clinical Impression Statement A: Patient reported increased difficulty with plank hold as he states it was difficult on his core and left shoulder. VC for form and technique.    Plan P: Continue with middle and lower trapezius strengthening. Complete more planks for isometric holding to strengthening shoulder and core stability.       Patient will benefit from skilled therapeutic intervention in order to improve the following deficits and impairments:  Decreased coordination, Decreased endurance, Decreased strength, Impaired UE functional use  Visit Diagnosis: Other symptoms and signs involving the musculoskeletal system  Other lack of coordination    Problem List Patient Active Problem List   Diagnosis Date Noted  . Generalized anxiety disorder 05/15/2016  . Acute bronchiolitis due to respiratory syncytial virus (RSV)   . Acute CVA (cerebrovascular accident) ([place]) 05/04/2016  . Dizziness 05/02/2016  . Anxiety and depression 05/02/2016  . Paroxysmal atrial fibrillation ([place]) 03/02/2016  . PFO (patent foramen ovale) 07/24/2015  . Chronic deep vein thrombosis (DVT) of popliteal vein of left lower extremity ([place]) 07/24/2015  . OSA (obstructive sleep apnea) 04/18/2015  . 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 artery ([place]) 04/18/2015  . Cerebellar stroke ([place]) 02/28/2015  . Snoring 01/23/2015  . Degeneration of cervical intervertebral disc 01/23/2015  . Essential hypertension 12/14/2014  . Diabetes mellitus type II, controlled ([place]) 12/14/2014  . HLD (hyperlipidemia) 12/14/2014  . Neck pain 12/14/2014  . Cerebral infarction, chronic   . Vertigo 10/14/2014  . 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 Impingement syndrome of left shoulder 08/02/2014  . Adhesive capsulitis of left shoulder  08/02/2014  . Abdominal pain 06/13/2014  . Rectal bleeding 03/13/2014  . Diabetes type 2, controlled ([place]) 02/18/2014  . Diabetic peripheral neuropathy ([place]) 03/28/2013  . Irreducible incisional hernia 11/11/2010   Ailene Ravel, OTR/L,CBIS  [phone]  07/08/2016, 5:29 PM  [facility] [address] [place], [place], [address] Phone: [phone]   Fax:  [phone]  Name: Travis [name] MRN: [id number] Date of Birth: [birth year]

## 2016-07-13 ENCOUNTER — Encounter: Payer: Self-pay | Admitting: Neurology

## 2016-07-13 ENCOUNTER — Ambulatory Visit (HOSPITAL_COMMUNITY): Payer: 59

## 2016-07-13 ENCOUNTER — Telehealth (HOSPITAL_COMMUNITY): Payer: Self-pay

## 2016-07-13 NOTE — Telephone Encounter (Signed)
[name] called to cx this apptment Koven is not feeling well today and will plan to be here on Thursday

## 2016-07-15 ENCOUNTER — Ambulatory Visit (HOSPITAL_COMMUNITY): Payer: 59

## 2016-07-15 ENCOUNTER — Telehealth: Payer: Self-pay | Admitting: Cardiology

## 2016-07-15 ENCOUNTER — Ambulatory Visit (HOSPITAL_COMMUNITY): Payer: 59 | Admitting: Speech Pathology

## 2016-07-15 ENCOUNTER — Encounter (HOSPITAL_COMMUNITY): Payer: Self-pay

## 2016-07-15 DIAGNOSIS — R29898 Other symptoms and signs involving the musculoskeletal system: Secondary | ICD-10-CM | POA: Diagnosis not present

## 2016-07-15 DIAGNOSIS — R278 Other lack of coordination: Secondary | ICD-10-CM | POA: Diagnosis not present

## 2016-07-15 NOTE — Therapy (Signed)
[place], [place], [address] Phone: [phone]   Fax:  [phone]  Occupational Therapy Treatment And reassessment Patient Details  Name: Travis [name] MRN: [id number] Date of Birth: 06-18-1960 Referring Provider: Dr. Rosalin Hawking  Encounter Date: 07/15/2016      OT End of Session - 07/15/16 1649    Visit Number 9   Number of Visits 16   Date for OT Re-Evaluation 07/30/16   Authorization Type [name]    OT Start Time 1436  reassess   OT Stop Time 1515   OT Time Calculation (min) 39 min   Activity Tolerance Patient tolerated treatment well   Behavior During Therapy [facility] for tasks assessed/performed      Past Medical History:  Diagnosis Date  . A-fib ([place]) 02/2016   found on loop recorder  . 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 Anxiety   . Arthritis   . Blood transfusion without reported diagnosis   . Chronic headaches   . Complication of anesthesia    bleed after last shoulder-had to stay overnight  . Depression   . Diabetes mellitus   . Diabetic peripheral neuropathy ([place]) 03/28/2013  . Diverticulosis   . Dizziness   . Hernia, inguinal   . Hyperlipidemia   . Hypertension   . Impingement syndrome of left shoulder 08/02/2014  . 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 Neuropathy of lower extremity   . Night sweats    every once in a while  . Sleep apnea    no CPAP  . Stroke ([place]) 02/2015  . Syncope and collapse     Past Surgical History:  Procedure Laterality Date  . CERVICAL DISC SURGERY    . COLON SURGERY  2003   1/3 removed for diverticulitis  . COLONOSCOPY    . EP IMPLANTABLE DEVICE N/A 05/01/2015   Procedure: Loop Recorder Insertion;  Surgeon: [name], MD;  Location: [place] [facility];  Service: Cardiovascular;  Laterality: N/A;  . INGUINAL HERNIA REPAIR Bilateral   . KNEE ARTHROSCOPY Right   . SHOULDER ARTHROSCOPY Right    1  . SHOULDER ARTHROSCOPY Left  08/02/2014   Procedure: LEFT SHOULDER SCOPE DEBRIDEMENT/ACROMIOPLASTY;  Surgeon: [name], MD;  Location: [place];  Service: Orthopedics;  Laterality: Left;  ANESTHESIA: GENERAL, PRE/POST OP SCALENE  . TEE WITHOUT CARDIOVERSION N/A 03/03/2015   Procedure: TRANSESOPHAGEAL ECHOCARDIOGRAM (TEE);  Surgeon: [name], MD;  [facility];  Service: Cardiology;  Laterality: N/A;  . UMBILICAL HERNIA REPAIR     with other hernia repair with mesh  . WRIST SURGERY Right    fusion    There were no vitals filed for this visit.      Subjective Assessment - 07/15/16 1644    Subjective  S: I do my tricep dips at home when I'm watching tv.   Currently in Pain? --  No report of pain this session.            [facility] OT Assessment - 07/15/16 1441      Assessment   Diagnosis LUE Weakness S/P CVA   Onset Date 05/02/16     Precautions   Precautions None     Prior Function   Level of Independence Independent   Vocation On disability     Strength   Overall Strength Comments RUE mid and lower trapezius is 4/5. Last reassessment: same   Strength Assessment Site Shoulder;Hand  Right/Left Shoulder Left   Left Shoulder Flexion 5/5   Left Shoulder ABduction 4+/5  previous: 4/5   Left Shoulder Internal Rotation 5/5   Left Shoulder External Rotation 5/5   Right/Left hand Left;Right   Left Hand Grip (lbs) 65  previous: 55   Left Hand Lateral Pinch 14 lbs  previous: 18   Left Hand 3 Point Pinch 14 lbs  previous: 12                  OT Treatments/Exercises (OP) - 07/15/16 1441      Exercises   Exercises Shoulder;Hand                OT Education - 07/15/16 1646    Education provided Yes   Education Details Discussed goals, progress in therapy, areas that therapy has been focusing on, patient's minimal progress. Recommended that patient finish out remainder of appointments and then be discharged to HEP. Patient was given energy  conservation technique handout.   Person(s) Educated Patient   Methods Explanation   Comprehension Verbalized understanding          OT Short Term Goals - 07/15/16 1455      OT SHORT TERM GOAL #1   Title Pt will be educated on and independent in [place].    Time 4   Period Weeks     OT SHORT TERM GOAL #2   Title Patient will increase left upper extremity strength to 4/5 for improved ability to complete yard work.   Time 4   Period Weeks   Status Achieved     OT SHORT TERM GOAL #3   Title Pt will increase LUE grip strength by 10# to increase ability to hold onto 2 liter drink bottles.    Time 4   Period Weeks   Status On-going     OT SHORT TERM GOAL #4   Title Patient will increase left hand pinch strength by 2 pounds for improved ability to manipulate household items.    Time 4   Period Weeks   Status Partially Met     OT SHORT TERM GOAL #5   Title Patient will increase sustained activity tolerance by 25% for improved ability to complete daily activities.    Time 4   Period Weeks   Status Achieved           OT Long Term Goals - 07/15/16 1503      OT LONG TERM GOAL #1   Title Patient will return to highest level of independence with all daily and leisure tasks.    Time 8   Period Weeks   Status Achieved     OT LONG TERM GOAL #2   Title Patient will demonstrate 4+/5 strength in his LUE for improved ability to particpate in yard work.   Time 8   Period Weeks   Status On-going     OT LONG TERM GOAL #3   Title Patient will improve left grip strength by 20# and pinch strength by 4# for improved ability to open containers and manipulate objects.    Time 8   Period Months   Status On-going     OT LONG TERM GOAL #4   Title Patient will improve sustained activity tolerance by 50% for increased independence with all daily activities.    Time 8   Period Weeks   Status On-going     OT LONG TERM GOAL #5   Title Patient will complete in hand manipulation  activities without difficulty.    Time 8   Period Weeks   Status Achieved     OT LONG TERM GOAL #6   Title Patient will be educated on energy conservation and work simplification principles and independently apply to daily tasks for improved safety.    Time 8   Period Weeks   Status Achieved               Plan - 07/15/16 1650    Clinical Impression Statement A: Reassessment completed this date. Patient has been attending therapy for strength of LUE and grip and pinch strength for 2 months now. Patient has made progress although it is minimal. patient had a time period when he was waiting for a tooth to be pulled, which he contributes to his lack of progress. With this in mind, he has had 1 month post tooth being pulled and he has progressed just slightly higher than his original scores. Dicussed patient's minimal progress. Patient reports that he is completing his HEP although unsure how accurate this is. Patient is focused on completing tricep dips which is not an area of weakness that OT is focusing on.    OT Frequency 2x / week   OT Duration 2 weeks   Plan P: Continue OT services for 2 more weeks focusing on grip/pinch strength, activity tolerance, and middle trapezius strengthening.       Patient will benefit from skilled therapeutic intervention in order to improve the following deficits and impairments:  Decreased coordination, Decreased endurance, Decreased strength, Impaired UE functional use  Visit Diagnosis: Other symptoms and signs involving the musculoskeletal system - Plan: Ot plan of care cert/re-cert  Other lack of coordination - Plan: Ot plan of care cert/re-cert    Problem List Patient Active Problem List   Diagnosis Date Noted  . Generalized anxiety disorder 05/15/2016  . Acute bronchiolitis due to respiratory syncytial virus (RSV)   . Acute CVA (cerebrovascular accident) ([place]) 05/04/2016  . Dizziness 05/02/2016  . Anxiety and depression 05/02/2016  .  Paroxysmal atrial fibrillation ([place]) 03/02/2016  . PFO (patent foramen ovale) 07/24/2015  . Chronic deep vein thrombosis (DVT) of popliteal vein of left lower extremity ([place]) 07/24/2015  . OSA (obstructive sleep apnea) 04/18/2015  . 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 artery ([place]) 04/18/2015  . Cerebellar stroke ([place]) 02/28/2015  . Snoring 01/23/2015  . Degeneration of cervical intervertebral disc 01/23/2015  . Essential hypertension 12/14/2014  . Diabetes mellitus type II, controlled ([place]) 12/14/2014  . HLD (hyperlipidemia) 12/14/2014  . Neck pain 12/14/2014  . Cerebral infarction, chronic   . Vertigo 10/14/2014  . 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 Impingement syndrome of left shoulder 08/02/2014  . 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 Abdominal pain 06/13/2014  . Rectal bleeding 03/13/2014  . Diabetes type 2, controlled ([place]) 02/18/2014  . Diabetic peripheral neuropathy ([place]) 03/28/2013  . Irreducible incisional hernia 11/11/2010   Ailene Ravel, OTR/L,CBIS  [phone]  07/15/2016, 4:56 PM  [facility] [address] [place], [place], [address] Phone: [phone]   Fax:  [phone]  Name: Travis [name] MRN: [id number] Date of Birth: [birth year]

## 2016-07-15 NOTE — Patient Instructions (Signed)

## 2016-07-15 NOTE — Telephone Encounter (Signed)
Attempted to call pt about disconnected monitor. No answer and unable to leave a message.

## 2016-07-16 LAB — CUP PACEART REMOTE DEVICE CHECK
Implantable Pulse Generator Implant Date: 20161229
MDC IDC SESS DTM: 20180222170633

## 2016-07-20 ENCOUNTER — Ambulatory Visit (HOSPITAL_COMMUNITY): Payer: 59

## 2016-07-20 ENCOUNTER — Encounter (HOSPITAL_COMMUNITY): Payer: Self-pay | Admitting: Speech Pathology

## 2016-07-20 ENCOUNTER — Telehealth (HOSPITAL_COMMUNITY): Payer: Self-pay | Admitting: Speech Pathology

## 2016-07-20 ENCOUNTER — Ambulatory Visit (HOSPITAL_COMMUNITY): Payer: 59 | Admitting: Speech Pathology

## 2016-07-20 ENCOUNTER — Encounter (HOSPITAL_COMMUNITY): Payer: Self-pay

## 2016-07-20 DIAGNOSIS — R278 Other lack of coordination: Secondary | ICD-10-CM

## 2016-07-20 DIAGNOSIS — R29898 Other symptoms and signs involving the musculoskeletal system: Secondary | ICD-10-CM

## 2016-07-20 NOTE — Telephone Encounter (Signed)
Telephone Call  Travis [name] did not show for his 1:45 PM SLP session. SLP left voicemail regarding missed appointment and requested that Pt return call.  Thank you,  Genene Churn, CCC-SLP [phone]

## 2016-07-20 NOTE — Therapy (Signed)
[place], [place], [address] Phone: [phone]   Fax:  [phone]  Occupational Therapy Treatment  Patient Details  Name: Travis [name] MRN: [id number] Date of Birth: [birth year] Referring Provider: Dr. Rosalin Hawking  Encounter Date: 07/20/2016      OT End of Session - 07/20/16 1545    Visit Number 9   Number of Visits 16   Date for OT Re-Evaluation 07/30/16   Authorization Type [name]    OT Start Time 1433   OT Stop Time 1515   OT Time Calculation (min) 42 min   Activity Tolerance Patient tolerated treatment well   Behavior During Therapy [facility] for tasks assessed/performed      Past Medical History:  Diagnosis Date  . A-fib ([place]) 02/2016   found on loop recorder  . 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 Anxiety   . Arthritis   . Blood transfusion without reported diagnosis   . Chronic headaches   . Complication of anesthesia    bleed after last shoulder-had to stay overnight  . Depression   . Diabetes mellitus   . Diabetic peripheral neuropathy ([place]) 03/28/2013  . Diverticulosis   . Dizziness   . Hernia, inguinal   . Hyperlipidemia   . Hypertension   . Impingement syndrome of left shoulder 08/02/2014  . 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 Neuropathy of lower extremity   . Night sweats    every once in a while  . Sleep apnea    no CPAP  . Stroke ([place]) 02/2015  . Syncope and collapse     Past Surgical History:  Procedure Laterality Date  . CERVICAL DISC SURGERY    . COLON SURGERY  2003   1/3 removed for diverticulitis  . COLONOSCOPY    . EP IMPLANTABLE DEVICE N/A 05/01/2015   Procedure: Loop Recorder Insertion;  Surgeon: [name], MD;  Location: [place] [facility];  Service: Cardiovascular;  Laterality: N/A;  . INGUINAL HERNIA REPAIR Bilateral   . KNEE ARTHROSCOPY Right   . SHOULDER ARTHROSCOPY Right    1  . SHOULDER ARTHROSCOPY Left 08/02/2014   Procedure: LEFT  SHOULDER SCOPE DEBRIDEMENT/ACROMIOPLASTY;  Surgeon: [name], MD;  Location: [place];  Service: Orthopedics;  Laterality: Left;  ANESTHESIA: GENERAL, PRE/POST OP SCALENE  . TEE WITHOUT CARDIOVERSION N/A 03/03/2015   Procedure: TRANSESOPHAGEAL ECHOCARDIOGRAM (TEE);  Surgeon: [name], MD;  [facility];  Service: Cardiology;  Laterality: N/A;  . UMBILICAL HERNIA REPAIR     with other hernia repair with mesh  . WRIST SURGERY Right    fusion    There were no vitals filed for this visit.      Subjective Assessment - 07/20/16 1543    Subjective  S: I did some yard work yesterday.   Currently in Pain? Other (Comment)  pt complains of periodic back pain during session although no number given.            [facility] OT Assessment - 07/20/16 1439      Assessment   Diagnosis LUE Weakness S/P CVA     Precautions   Precautions None                  OT Treatments/Exercises (OP) - 07/20/16 1436      Exercises   Exercises Shoulder;Hand     Shoulder Exercises: Prone   Other Prone Exercises Completed middle and lower trapezius strengthening  exercises with 3#; 15X     Shoulder Exercises: Standing   Protraction Theraband;12 reps   Theraband Level (Shoulder Protraction) Level 3 (Green)   Other Standing Exercises T. Y, and A theraband positions; 12X; green band   Other Standing Exercises Overhead press with arm together; 12X green band     Shoulder Exercises: ROM/Strengthening   UBE (Upper Arm Bike) level 4 3' reverse   Cybex Row 3 plate;15 reps                  OT Short Term Goals - 07/20/16 1550      OT SHORT TERM GOAL #1   Title Pt will be educated on and independent in HEP.    Time 4   Period Weeks     OT SHORT TERM GOAL #2   Title Patient will increase left upper extremity strength to 4/5 for improved ability to complete yard work.   Time 4   Period Weeks     OT SHORT TERM GOAL #3   Title Pt will increase  LUE grip strength by 10# to increase ability to hold onto 2 liter drink bottles.    Time 4   Period Weeks   Status On-going     OT SHORT TERM GOAL #4   Title Patient will increase left hand pinch strength by 2 pounds for improved ability to manipulate household items.    Time 4   Period Weeks   Status Partially Met     OT SHORT TERM GOAL #5   Title Patient will increase sustained activity tolerance by 25% for improved ability to complete daily activities.    Time 4   Period Weeks           OT Long Term Goals - 07/20/16 1550      OT LONG TERM GOAL #1   Title Patient will return to highest level of independence with all daily and leisure tasks.    Time 8   Period Weeks     OT LONG TERM GOAL #2   Title Patient will demonstrate 4+/5 strength in his LUE for improved ability to particpate in yard work.   Time 8   Period Weeks   Status On-going     OT LONG TERM GOAL #3   Title Patient will improve left grip strength by 20# and pinch strength by 4# for improved ability to open containers and manipulate objects.    Time 8   Period Months   Status On-going     OT LONG TERM GOAL #4   Title Patient will improve sustained activity tolerance by 50% for increased independence with all daily activities.    Time 8   Period Weeks   Status On-going     OT LONG TERM GOAL #5   Title Patient will complete in hand manipulation activities without difficulty.    Time 8   Period Weeks     OT LONG TERM GOAL #6   Title Patient will be educated on energy conservation and work simplification principles and independently apply to daily tasks for improved safety.    Time 8   Period Weeks               Plan - 07/20/16 1548    Clinical Impression Statement A: Session focused on middle trapezius strengthening with patient taking rest breaks as needed for fatigue. VC for form and technique.    Plan P: Start with grip/pinch strength and continue to focus on  middle trapezius  strengthening.       Patient will benefit from skilled therapeutic intervention in order to improve the following deficits and impairments:  Decreased coordination, Decreased endurance, Decreased strength, Impaired UE functional use  Visit Diagnosis: Other symptoms and signs involving the musculoskeletal system  Other lack of coordination    Problem List Patient Active Problem List   Diagnosis Date Noted  . Generalized anxiety disorder 05/15/2016  . Acute bronchiolitis due to respiratory syncytial virus (RSV)   . Acute CVA (cerebrovascular accident) ([place]) 05/04/2016  . Dizziness 05/02/2016  . Anxiety and depression 05/02/2016  . Paroxysmal atrial fibrillation ([place]) 03/02/2016  . PFO (patent foramen ovale) 07/24/2015  . Chronic deep vein thrombosis (DVT) of popliteal vein of left lower extremity ([place]) 07/24/2015  . OSA (obstructive sleep apnea) 04/18/2015  . 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 artery ([place]) 04/18/2015  . Cerebellar stroke ([place]) 02/28/2015  . Snoring 01/23/2015  . Degeneration of cervical intervertebral disc 01/23/2015  . Essential hypertension 12/14/2014  . Diabetes mellitus type II, controlled ([place]) 12/14/2014  . HLD (hyperlipidemia) 12/14/2014  . Neck pain 12/14/2014  . Cerebral infarction, chronic   . Vertigo 10/14/2014  . 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 Impingement syndrome of left shoulder 08/02/2014  . 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 Abdominal pain 06/13/2014  . Rectal bleeding 03/13/2014  . Diabetes type 2, controlled ([place]) 02/18/2014  . Diabetic peripheral neuropathy ([place]) 03/28/2013  . Irreducible incisional hernia 11/11/2010   Ailene Ravel, OTR/L,CBIS  [phone]  07/20/2016, 3:52 PM  [facility] [address] [place], [place], [address] Phone: [phone]   Fax:  [phone]  Name: Travis [name] MRN: [id number] Date of Birth: November 29, 1960

## 2016-07-21 ENCOUNTER — Other Ambulatory Visit: Payer: Self-pay | Admitting: Family Medicine

## 2016-07-21 NOTE — Telephone Encounter (Signed)
May fill times 4

## 2016-07-22 ENCOUNTER — Encounter (HOSPITAL_COMMUNITY): Payer: Self-pay

## 2016-07-22 ENCOUNTER — Other Ambulatory Visit: Payer: Self-pay | Admitting: Family Medicine

## 2016-07-22 ENCOUNTER — Ambulatory Visit (HOSPITAL_COMMUNITY): Payer: 59

## 2016-07-22 ENCOUNTER — Encounter: Payer: Self-pay | Admitting: Cardiology

## 2016-07-22 DIAGNOSIS — R278 Other lack of coordination: Secondary | ICD-10-CM | POA: Diagnosis not present

## 2016-07-22 DIAGNOSIS — R29898 Other symptoms and signs involving the musculoskeletal system: Secondary | ICD-10-CM

## 2016-07-22 NOTE — Progress Notes (Signed)
Letter  

## 2016-07-23 NOTE — Telephone Encounter (Signed)
May have this +3 refills 

## 2016-07-23 NOTE — Telephone Encounter (Signed)
Last seen 06/2016

## 2016-07-23 NOTE — Therapy (Signed)
[facility] [place], [place], [address] Phone: [phone]   Fax:  [phone]  Occupational Therapy Treatment  Patient Details  Name: Travis [name] MRN: [id number] Date of Birth: May 24, 1960 Referring Provider: Dr. Rosalin Hawking  Encounter Date: 07/22/2016      OT End of Session - 07/22/16 1513    Visit Number 10   Number of Visits 16   Date for OT Re-Evaluation 07/30/16   Authorization Type [place] [employer]    OT Start Time 1430   OT Stop Time 1515   OT Time Calculation (min) 45 min   Activity Tolerance Patient tolerated treatment well   Behavior During Therapy [facility] for tasks assessed/performed      Past Medical History:  Diagnosis Date  . A-fib ([place]) 02/2016   found on loop recorder  . 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 Anxiety   . Arthritis   . Blood transfusion without reported diagnosis   . Chronic headaches   . Complication of anesthesia    bleed after last shoulder-had to stay overnight  . Depression   . Diabetes mellitus   . Diabetic peripheral neuropathy ([place]) 03/28/2013  . Diverticulosis   . Dizziness   . Hernia, inguinal   . Hyperlipidemia   . Hypertension   . Impingement syndrome of left shoulder 08/02/2014  . 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 Neuropathy of lower extremity   . Night sweats    every once in a while  . Sleep apnea    no CPAP  . Stroke ([place]) 02/2015  . Syncope and collapse     Past Surgical History:  Procedure Laterality Date  . CERVICAL DISC SURGERY    . COLON SURGERY  2003   1/3 removed for diverticulitis  . COLONOSCOPY    . EP IMPLANTABLE DEVICE N/A 05/01/2015   Procedure: Loop Recorder Insertion;  Surgeon: [name], MD;  Location: [facility];  Service: Cardiovascular;  Laterality: N/A;  . INGUINAL HERNIA REPAIR Bilateral   . KNEE ARTHROSCOPY Right   . SHOULDER ARTHROSCOPY Right    1  . SHOULDER ARTHROSCOPY Left 08/02/2014   Procedure: LEFT  SHOULDER SCOPE DEBRIDEMENT/ACROMIOPLASTY;  Surgeon: [name], MD;  Location: [facility];  Service: Orthopedics;  Laterality: Left;  ANESTHESIA: GENERAL, PRE/POST OP SCALENE  . TEE WITHOUT CARDIOVERSION N/A 03/03/2015   Procedure: TRANSESOPHAGEAL ECHOCARDIOGRAM (TEE);  Surgeon: [name], MD;  [facility];  Service: Cardiology;  Laterality: N/A;  . UMBILICAL HERNIA REPAIR     with other hernia repair with mesh  . WRIST SURGERY Right    fusion    There were no vitals filed for this visit.      Subjective Assessment - 07/22/16 1440    Subjective  S: I'm just not feeling well today.   Currently in Pain? Yes   Pain Score 7    Pain Location Shoulder  lower back   Pain Orientation Right;Left   Pain Descriptors / Indicators Sore;Aching   Pain Type Acute pain   Pain Radiating Towards N/A   Pain Onset More than a month ago   Pain Frequency Constant   Aggravating Factors  unknown   Pain Relieving Factors taking a shower    Effect of Pain on Daily Activities mod effect   Multiple Pain Sites No            [facility] OT Assessment - 07/22/16 1443  Assessment   Diagnosis LUE Weakness S/P CVA     Precautions   Precautions None                  OT Treatments/Exercises (OP) - 07/22/16 1443      Exercises   Exercises Shoulder;Hand     Shoulder Exercises: ROM/Strengthening   UBE (Upper Arm Bike) level 4 3' reverse     Hand Exercises   Theraputty Flatten;Roll;Grip;Pinch   Theraputty - Flatten blue   Theraputty - Roll Blue   Theraputty - Grip Blue   Theraputty - Pinch Blue   Other Hand Exercises Pt used pvc pipe to push circles in blue theraputty focusing on grip strengthening and wrist strength needed for opening soda bottles.    Other Hand Exercises Patient used blue theraputty to complete wringing out motion                  OT Short Term Goals - 07/20/16 1550      OT SHORT TERM GOAL #1   Title Pt will be  educated on and independent in HEP.    Time 4   Period Weeks     OT SHORT TERM GOAL #2   Title Patient will increase left upper extremity strength to 4/5 for improved ability to complete yard work.   Time 4   Period Weeks     OT SHORT TERM GOAL #3   Title Pt will increase LUE grip strength by 10# to increase ability to hold onto 2 liter drink bottles.    Time 4   Period Weeks   Status On-going     OT SHORT TERM GOAL #4   Title Patient will increase left hand pinch strength by 2 pounds for improved ability to manipulate household items.    Time 4   Period Weeks   Status Partially Met     OT SHORT TERM GOAL #5   Title Patient will increase sustained activity tolerance by 25% for improved ability to complete daily activities.    Time 4   Period Weeks           OT Long Term Goals - 07/20/16 1550      OT LONG TERM GOAL #1   Title Patient will return to highest level of independence with all daily and leisure tasks.    Time 8   Period Weeks     OT LONG TERM GOAL #2   Title Patient will demonstrate 4+/5 strength in his LUE for improved ability to particpate in yard work.   Time 8   Period Weeks   Status On-going     OT LONG TERM GOAL #3   Title Patient will improve left grip strength by 20# and pinch strength by 4# for improved ability to open containers and manipulate objects.    Time 8   Period Months   Status On-going     OT LONG TERM GOAL #4   Title Patient will improve sustained activity tolerance by 50% for increased independence with all daily activities.    Time 8   Period Weeks   Status On-going     OT LONG TERM GOAL #5   Title Patient will complete in hand manipulation activities without difficulty.    Time 8   Period Weeks     OT LONG TERM GOAL #6   Title Patient will be educated on energy conservation and work simplification principles and independently apply to daily tasks for improved safety.  Time 8   Period Weeks               Plan  - 07/22/16 1513    Clinical Impression Statement A: Session focused on grip and pinch strengthening upgrading to blue theraputty   Plan P: Continue to with strengthening middle trapezius and grip and pinch strengthening.       Patient will benefit from skilled therapeutic intervention in order to improve the following deficits and impairments:  Decreased coordination, Decreased endurance, Decreased strength, Impaired UE functional use  Visit Diagnosis: Other symptoms and signs involving the musculoskeletal system  Other lack of coordination    Problem List Patient Active Problem List   Diagnosis Date Noted  . Generalized anxiety disorder 05/15/2016  . Acute bronchiolitis due to respiratory syncytial virus (RSV)   . Acute CVA (cerebrovascular accident) ([place]) 05/04/2016  . Dizziness 05/02/2016  . Anxiety and depression 05/02/2016  . Paroxysmal atrial fibrillation ([place]) 03/02/2016  . PFO (patent foramen ovale) 07/24/2015  . Chronic deep vein thrombosis (DVT) of popliteal vein of left lower extremity ([place]) 07/24/2015  . OSA (obstructive sleep apnea) 04/18/2015  . 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 artery ([place]) 04/18/2015  . Cerebellar stroke ([place]) 02/28/2015  . Snoring 01/23/2015  . Degeneration of cervical intervertebral disc 01/23/2015  . Essential hypertension 12/14/2014  . Diabetes mellitus type II, controlled ([place]) 12/14/2014  . HLD (hyperlipidemia) 12/14/2014  . Neck pain 12/14/2014  . Cerebral infarction, chronic   . Vertigo 10/14/2014  . 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 Impingement syndrome of left shoulder 08/02/2014  . 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 Abdominal pain 06/13/2014  . Rectal bleeding 03/13/2014  . Diabetes type 2, controlled ([place]) 02/18/2014  . Diabetic peripheral neuropathy ([place]) 03/28/2013  . Irreducible incisional hernia 11/11/2010   Ailene Ravel, OTR/L,CBIS  [phone]  07/23/2016, 2:18 PM  [facility] [address] [place], [place], [address] Phone: [phone]   Fax:  [phone]  Name: XAIDYN KEPNER MRN: [id number] Date of Birth: [birth year]

## 2016-07-26 ENCOUNTER — Ambulatory Visit (INDEPENDENT_AMBULATORY_CARE_PROVIDER_SITE_OTHER): Payer: 59 | Admitting: *Deleted

## 2016-07-26 DIAGNOSIS — I63443 Cerebral infarction due to embolism of bilateral cerebellar arteries: Secondary | ICD-10-CM | POA: Diagnosis not present

## 2016-07-27 ENCOUNTER — Ambulatory Visit (HOSPITAL_COMMUNITY): Payer: 59 | Admitting: Speech Pathology

## 2016-07-27 ENCOUNTER — Telehealth (HOSPITAL_COMMUNITY): Payer: Self-pay

## 2016-07-27 ENCOUNTER — Ambulatory Visit (HOSPITAL_COMMUNITY): Payer: 59

## 2016-07-27 NOTE — Telephone Encounter (Signed)
[name] called to say he is not feeling well today

## 2016-07-27 NOTE — Progress Notes (Signed)
[facility] Summary Report / Loop Recorder 

## 2016-07-29 ENCOUNTER — Ambulatory Visit (HOSPITAL_COMMUNITY): Payer: 59

## 2016-07-29 DIAGNOSIS — R278 Other lack of coordination: Secondary | ICD-10-CM

## 2016-07-29 DIAGNOSIS — R29898 Other symptoms and signs involving the musculoskeletal system: Secondary | ICD-10-CM | POA: Diagnosis not present

## 2016-07-29 NOTE — Therapy (Signed)
[facility] [place], [place], [address] Phone: [phone]   Fax:  [phone]  Occupational Therapy Treatment  Patient Details  Name: TORRELL KRUTZ MRN: [id number] Date of Birth: 11-11-1960 Referring Provider: Dr. Rosalin Hawking  Encounter Date: 07/29/2016      OT End of Session - 07/29/16 1536    Visit Number 11   Number of Visits 16   Date for OT Re-Evaluation 07/30/16   Authorization Type [name] [employer]    OT Start Time 1443  Pt arrived late   OT Stop Time 1518   OT Time Calculation (min) 35 min   Activity Tolerance Patient tolerated treatment well   Behavior During Therapy [facility] for tasks assessed/performed      Past Medical History:  Diagnosis Date  . A-fib ([place]) 02/2016   found on loop recorder  . 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 Anxiety   . Arthritis   . Blood transfusion without reported diagnosis   . Chronic headaches   . Complication of anesthesia    bleed after last shoulder-had to stay overnight  . Depression   . Diabetes mellitus   . Diabetic peripheral neuropathy ([place]) 03/28/2013  . Diverticulosis   . Dizziness   . Hernia, inguinal   . Hyperlipidemia   . Hypertension   . Impingement syndrome of left shoulder 08/02/2014  . 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 Neuropathy of lower extremity   . Night sweats    every once in a while  . Sleep apnea    no CPAP  . Stroke ([place]) 02/2015  . Syncope and collapse     Past Surgical History:  Procedure Laterality Date  . CERVICAL DISC SURGERY    . COLON SURGERY  2003   1/3 removed for diverticulitis  . COLONOSCOPY    . EP IMPLANTABLE DEVICE N/A 05/01/2015   Procedure: Loop Recorder Insertion;  Surgeon: [name], MD;  Location: [facility];  Service: Cardiovascular;  Laterality: N/A;  . INGUINAL HERNIA REPAIR Bilateral   . KNEE ARTHROSCOPY Right   . SHOULDER ARTHROSCOPY Right    1  . SHOULDER ARTHROSCOPY Left 08/02/2014   Procedure: LEFT SHOULDER SCOPE DEBRIDEMENT/ACROMIOPLASTY;  Surgeon: [name], MD;  Location: [facility];  Service: Orthopedics;  Laterality: Left;  ANESTHESIA: GENERAL, PRE/POST OP SCALENE  . TEE WITHOUT CARDIOVERSION N/A 03/03/2015   Procedure: TRANSESOPHAGEAL ECHOCARDIOGRAM (TEE);  Surgeon: [name], MD;  [facility];  Service: Cardiology;  Laterality: N/A;  . UMBILICAL HERNIA REPAIR     with other hernia repair with mesh  . WRIST SURGERY Right    fusion    There were no vitals filed for this visit.      Subjective Assessment - 07/29/16 1535    Subjective  S: I'm thinking of all the things I need to do at home.    Currently in Pain? No/denies   Pain Onset More than a month ago                      OT Treatments/Exercises (OP) - 07/29/16 1449      Exercises   Exercises Shoulder     Shoulder Exercises: Standing   Horizontal ABduction Theraband;15 reps   Theraband Level (Shoulder Horizontal ABduction) Level 3 (Green)   Flexion Theraband;15 reps   Theraband Level (Shoulder Flexion) Level 3 (Green)   Other Standing Exercises T. Y, and A theraband positions;  15X; green band   Other Standing Exercises Overhead press with arm together; 12X green band     Shoulder Exercises: ROM/Strengthening   UBE (Upper Arm Bike) Level 4 1' forward 1' reverse                  OT Short Term Goals - 07/29/16 1543      OT SHORT TERM GOAL #1   Title Pt will be educated on and independent in HEP.    Time 4   Period Weeks     OT SHORT TERM GOAL #2   Title Patient will increase left upper extremity strength to 4/5 for improved ability to complete yard work.   Time 4   Period Weeks     OT SHORT TERM GOAL #3   Title Pt will increase LUE grip strength by 10# to increase ability to hold onto 2 liter drink bottles.    Time 4   Period Weeks   Status Not Met     OT SHORT TERM GOAL #4   Title Patient will increase left hand pinch  strength by 2 pounds for improved ability to manipulate household items.    Time 4   Period Weeks   Status Partially Met     OT SHORT TERM GOAL #5   Title Patient will increase sustained activity tolerance by 25% for improved ability to complete daily activities.    Time 4   Period Weeks           OT Long Term Goals - 07/29/16 1545      OT LONG TERM GOAL #1   Title Patient will return to highest level of independence with all daily and leisure tasks.    Time 8   Period Weeks     OT LONG TERM GOAL #2   Title Patient will demonstrate 4+/5 strength in his LUE for improved ability to particpate in yard work.   Time 8   Period Weeks   Status Not Met     OT LONG TERM GOAL #3   Title Patient will improve left grip strength by 20# and pinch strength by 4# for improved ability to open containers and manipulate objects.    Time 8   Period Months   Status Not Met     OT LONG TERM GOAL #4   Title Patient will improve sustained activity tolerance by 50% for increased independence with all daily activities.    Time 8   Period Weeks   Status Not Met     OT LONG TERM GOAL #5   Title Patient will complete in hand manipulation activities without difficulty.    Time 8   Period Weeks     OT LONG TERM GOAL #6   Title Patient will be educated on energy conservation and work simplification principles and independently apply to daily tasks for improved safety.    Time 8   Period Weeks               Plan - 07/29/16 1542    Clinical Impression Statement A: Patient has met 3/5 STGs and 3/6 LTGs. Session focused on middle and lower trapezius strength for last session. last session of OT scheduled for today as patient has plateaued with therapy progress. Patient has a HEP established already and was encouraged to continue completing independently at home. Patient has shown some improvement with UB strength although continues to have deficits with strength.     Plan P; D/C from  OT  services to continue completing HEP at home.       Patient will benefit from skilled therapeutic intervention in order to improve the following deficits and impairments:  Decreased coordination, Decreased endurance, Decreased strength, Impaired UE functional use  Visit Diagnosis: Other symptoms and signs involving the musculoskeletal system  Other lack of coordination    Problem List Patient Active Problem List   Diagnosis Date Noted  . Generalized anxiety disorder 05/15/2016  . Acute bronchiolitis due to respiratory syncytial virus (RSV)   . Acute CVA (cerebrovascular accident) ([place]) 05/04/2016  . Dizziness 05/02/2016  . Anxiety and depression 05/02/2016  . Paroxysmal atrial fibrillation ([place]) 03/02/2016  . PFO (patent foramen ovale) 07/24/2015  . Chronic deep vein thrombosis (DVT) of popliteal vein of left lower extremity ([place]) 07/24/2015  . OSA (obstructive sleep apnea) 04/18/2015  . 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 artery ([place]) 04/18/2015  . Cerebellar stroke ([place]) 02/28/2015  . Snoring 01/23/2015  . Degeneration of cervical intervertebral disc 01/23/2015  . Essential hypertension 12/14/2014  . Diabetes mellitus type II, controlled ([place]) 12/14/2014  . HLD (hyperlipidemia) 12/14/2014  . Neck pain 12/14/2014  . Cerebral infarction, chronic   . Vertigo 10/14/2014  . 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 Impingement syndrome of left shoulder 08/02/2014  . 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 Abdominal pain 06/13/2014  . Rectal bleeding 03/13/2014  . Diabetes type 2, controlled ([place]) 02/18/2014  . Diabetic peripheral neuropathy ([place]) 03/28/2013  . Irreducible incisional hernia 11/11/2010   OCCUPATIONAL THERAPY DISCHARGE SUMMARY  Visits from Start of Care: 11  Current functional level related to goals / functional outcomes: See above   Remaining deficits: See above   Education / Equipment: Trapezius strengthening Plan: Patient  agrees to discharge.  Patient goals were partially met. Patient is being discharged due to lack of progress.  ?????         Ailene Ravel, OTR/L,CBIS  [phone]  07/29/2016, 3:59 PM  [facility] [address] [place], [place], [address] Phone: [phone]   Fax:  [phone]  Name: MARLOWE LAWES MRN: [id number] Date of Birth: 06/12/1960

## 2016-07-30 MED FILL — ELIQUIS 5 MG TABLET: 5 | 30 days supply | Qty: 60 | Fill #3

## 2016-08-03 ENCOUNTER — Ambulatory Visit (HOSPITAL_COMMUNITY): Payer: 59

## 2016-08-03 ENCOUNTER — Encounter (HOSPITAL_COMMUNITY): Payer: Self-pay | Admitting: Speech Pathology

## 2016-08-03 ENCOUNTER — Ambulatory Visit (HOSPITAL_COMMUNITY): Payer: 59 | Attending: Neurology | Admitting: Speech Pathology

## 2016-08-03 DIAGNOSIS — R41841 Cognitive communication deficit: Secondary | ICD-10-CM | POA: Diagnosis not present

## 2016-08-03 DIAGNOSIS — R471 Dysarthria and anarthria: Secondary | ICD-10-CM | POA: Diagnosis not present

## 2016-08-03 NOTE — Therapy (Signed)
[place], [place], [address] Phone: [phone]   Fax:  [phone]  Speech Language Pathology Treatment  Patient Details  Name: Travis [name] MRN: [id number] Date of Birth: [birth year] Referring Provider: Rosalin Hawking  Encounter Date: 08/03/2016      End of Session - 08/03/16 1405    Visit Number 6   Number of Visits 8   Date for SLP Re-Evaluation 06/17/16   Authorization Type [facility]   Authorization Time Period 05/17/2016-06/17/2016   SLP Start Time 1345   SLP Stop Time  1425   SLP Time Calculation (min) 40 min   Activity Tolerance Patient tolerated treatment well      Past Medical History:  Diagnosis Date  . A-fib ([place]) 02/2016   found on loop recorder  . 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 Anxiety   . Arthritis   . Blood transfusion without reported diagnosis   . Chronic headaches   . Complication of anesthesia    bleed after last shoulder-had to stay overnight  . Depression   . Diabetes mellitus   . Diabetic peripheral neuropathy ([place]) 03/28/2013  . Diverticulosis   . Dizziness   . Hernia, inguinal   . Hyperlipidemia   . Hypertension   . Impingement syndrome of left shoulder 08/02/2014  . 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 Neuropathy of lower extremity   . Night sweats    every once in a while  . Sleep apnea    no CPAP  . Stroke ([place]) 02/2015  . Syncope and collapse     Past Surgical History:  Procedure Laterality Date  . CERVICAL DISC SURGERY    . COLON SURGERY  2003   1/3 removed for diverticulitis  . COLONOSCOPY    . EP IMPLANTABLE DEVICE N/A 05/01/2015   Procedure: Loop Recorder Insertion;  Surgeon: [name], MD;  Location: [facility];  Service: Cardiovascular;  Laterality: N/A;  . INGUINAL HERNIA REPAIR Bilateral   . KNEE ARTHROSCOPY Right   . SHOULDER ARTHROSCOPY Right    1  . SHOULDER ARTHROSCOPY Left 08/02/2014   Procedure: LEFT SHOULDER SCOPE  DEBRIDEMENT/ACROMIOPLASTY;  Surgeon: [name], MD;  Location: [facility];  Service: Orthopedics;  Laterality: Left;  ANESTHESIA: GENERAL, PRE/POST OP SCALENE  . TEE WITHOUT CARDIOVERSION N/A 03/03/2015   Procedure: TRANSESOPHAGEAL ECHOCARDIOGRAM (TEE);  Surgeon: [name], MD;  [facility];  Service: Cardiology;  Laterality: N/A;  . UMBILICAL HERNIA REPAIR     with other hernia repair with mesh  . WRIST SURGERY Right    fusion    There were no vitals filed for this visit.      Subjective Assessment - 08/03/16 1403    Subjective "I am always just so tired."   Currently in Pain? No/denies          ADULT SLP TREATMENT - 08/03/16 1404      General Information   Behavior/Cognition Cooperative;Pleasant mood   Patient Positioning Upright in chair   Oral care provided N/A   HPI Travis [name] is a 56 yo male who was referred for SLE by his neurologist, Dr. Erlinda Hong. [name] is known to this SLP from previous cognitive linguistic therapy following a stroke. He was admitted to [facility] on 05/02/16 due to dizziness, leaning towards left, slurry speech, left arm numbnessand generalized weakness. MRI showed left dorsal medulla small infarct. CTA head and neck, CUS and  TTE unremarkable. LDL 60 and A1C 5.4. He had cough and found to have RSV URI. He was continued on eliquis and lipitor on discharge. Since discharge, pt has been doing better. Stated that speech much clearer but slower, still has left forearm bandlike numbness and tightness feeling, more around wrist. Motor back to baseline, still walk with cane for safety. BP 119/78. Wife still complains of s/s of OSA, pt was diagnosed with OSA but refused CPAP. May consider nasal pillow, he will follow up with PCP next week to arrange.     Treatment Provided   Treatment provided Cognitive-Linquistic     Cognitive-Linquistic Treatment   Treatment focused on Cognition;Dysarthria   Skilled  Treatment SLP feedback, implementation of compensatory strategies, Pt self evaluation     Assessment / Recommendations / Plan   Plan Discharge SLP treatment due to (comment);All goals met            SLP Short Term Goals - 08/03/16 1406      SLP SHORT TERM GOAL #1   Title Pt will implement memory strategies in functional tasks for 90% acc with min assist   Baseline 2/5 recall of words without cues   Time 4   Period Weeks   Status Partially Met     SLP SHORT TERM GOAL #2   Title Pt will implement speech intelligibility strategies during conversational speech to increase naturalness and accuracy with 95% accuracy as judged by SLP in 1:1 conversations.   Baseline Pt with hesitations, repetitions, revisions of whole words in conversation 20% of the time.   Time 4   Period Weeks   Status Achieved     SLP SHORT TERM GOAL #3   Title Pt will verbalize at least 4 compensatory strategies for speech intelligibility and memory with indirect cues.   Baseline Pt verbalizes 2 with cues   Time 4   Period Weeks   Status Achieved          SLP Long Term Goals - 08/03/16 1413      SLP LONG TERM GOAL #1   Title Same as short term   Status Achieved          Plan - 08/03/16 1406    Clinical Impression Statement [name] canceled several appointments over the past 6 weeks and finally arrived for SLP this date. Pt with improved speech fluency and intelligibility this date as judged by SLP and Pt self perception. His dental work is complete and I suspect this has helped his speech somewhat. He continues to report that he feels weaker physically and that he's "just not getting any better". He continues follow up with his doctors. Pt completed memory/recall tasks in session with 100% acc with min assist to implement strategies. Pt is able to drive himself to appointments, but does endorse forgetting appointments at times (mixes up times). He has continually been reminded to utilize a  calendar and/or small notebook to help keep him organized. Pt made good progress toward goals. Given substantial time between this SLP session and previous, Pt will be discharged from SLP services at this time.     Speech Therapy Frequency 2x / week   Duration 4 weeks   Treatment/Interventions SLP instruction and feedback;Compensatory strategies;Patient/family education   Potential to Achieve Goals Good   Potential Considerations Previous level of function   SLP Home Exercise Plan Pt will be independent with HEP as assigned to facilitate carryover of treatment strategies and techniques in home environment.   Consulted  and Agree with Plan of Care Patient      Patient will benefit from skilled therapeutic intervention in order to improve the following deficits and impairments:   Dysarthria  Cognitive communication deficit    Problem List Patient Active Problem List   Diagnosis Date Noted  . Generalized anxiety disorder 05/15/2016  . Acute bronchiolitis due to respiratory syncytial virus (RSV)   . Acute CVA (cerebrovascular accident) ([place]) 05/04/2016  . Dizziness 05/02/2016  . Anxiety and depression 05/02/2016  . Paroxysmal atrial fibrillation ([place]) 03/02/2016  . PFO (patent foramen ovale) 07/24/2015  . Chronic deep vein thrombosis (DVT) of popliteal vein of left lower extremity ([place]) 07/24/2015  . OSA (obstructive sleep apnea) 04/18/2015  . 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 artery ([place]) 04/18/2015  . Cerebellar stroke ([place]) 02/28/2015  . Snoring 01/23/2015  . Degeneration of cervical intervertebral disc 01/23/2015  . Essential hypertension 12/14/2014  . Diabetes mellitus type II, controlled ([place]) 12/14/2014  . HLD (hyperlipidemia) 12/14/2014  . Neck pain 12/14/2014  . Cerebral infarction, chronic   . Vertigo 10/14/2014  . 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 Impingement syndrome of left shoulder 08/02/2014  . Adhesive capsulitis of left shoulder  08/02/2014  . Abdominal pain 06/13/2014  . Rectal bleeding 03/13/2014  . Diabetes type 2, controlled ([place]) 02/18/2014  . Diabetic peripheral neuropathy ([place]) 03/28/2013  . Irreducible incisional hernia 11/11/2010    SPEECH THERAPY DISCHARGE SUMMARY  Visits from Start of Care: 6  Current functional level related to goals / functional outcomes: Goals met   Remaining deficits: Mild dysarthria   Education / Equipment: N/A  Plan: Patient agrees to discharge.  Patient goals were partially met. Patient is being discharged due to meeting the stated rehab goals.  ?????        Thank you,  Genene Churn, [place]  [facility] 08/03/2016, 2:13 PM  [facility] [address] [place], [place], [address] Phone: [phone]   Fax:  [phone]   Name: Travis [name] MRN: [id number] Date of Birth: Jun 18, 1960

## 2016-08-04 LAB — CUP PACEART REMOTE DEVICE CHECK
Implantable Pulse Generator Implant Date: 20161229
MDC IDC SESS DTM: 20180324174444

## 2016-08-05 ENCOUNTER — Ambulatory Visit (HOSPITAL_COMMUNITY): Payer: 59 | Admitting: Speech Pathology

## 2016-08-05 ENCOUNTER — Encounter (HOSPITAL_COMMUNITY): Payer: 59 | Admitting: Specialist

## 2016-08-05 MED FILL — TOPIRAMATE 25 MG TABLET: 25 | 30 days supply | Qty: 120 | Fill #8

## 2016-08-10 ENCOUNTER — Ambulatory Visit (HOSPITAL_COMMUNITY): Payer: 59 | Admitting: Speech Pathology

## 2016-08-10 ENCOUNTER — Encounter (HOSPITAL_COMMUNITY): Payer: 59 | Admitting: Occupational Therapy

## 2016-08-12 ENCOUNTER — Encounter (HOSPITAL_COMMUNITY): Payer: 59 | Admitting: Speech Pathology

## 2016-08-12 ENCOUNTER — Encounter (HOSPITAL_COMMUNITY): Payer: 59

## 2016-08-16 ENCOUNTER — Ambulatory Visit: Payer: 59 | Admitting: Neurology

## 2016-08-17 ENCOUNTER — Encounter (HOSPITAL_COMMUNITY): Payer: 59 | Admitting: Speech Pathology

## 2016-08-17 ENCOUNTER — Encounter (HOSPITAL_COMMUNITY): Payer: 59

## 2016-08-19 ENCOUNTER — Encounter (HOSPITAL_COMMUNITY): Payer: 59

## 2016-08-19 ENCOUNTER — Encounter (HOSPITAL_COMMUNITY): Payer: 59 | Admitting: Speech Pathology

## 2016-08-23 ENCOUNTER — Ambulatory Visit (INDEPENDENT_AMBULATORY_CARE_PROVIDER_SITE_OTHER): Payer: 59 | Admitting: *Deleted

## 2016-08-23 DIAGNOSIS — I63443 Cerebral infarction due to embolism of bilateral cerebellar arteries: Secondary | ICD-10-CM | POA: Diagnosis not present

## 2016-08-24 ENCOUNTER — Encounter (HOSPITAL_COMMUNITY): Payer: 59

## 2016-08-24 ENCOUNTER — Encounter (HOSPITAL_COMMUNITY): Payer: 59 | Admitting: Speech Pathology

## 2016-08-24 NOTE — Progress Notes (Signed)
[facility] Summary Report / Loop Recorder 

## 2016-08-26 ENCOUNTER — Encounter (HOSPITAL_COMMUNITY): Payer: 59

## 2016-08-26 ENCOUNTER — Encounter (HOSPITAL_COMMUNITY): Payer: 59 | Admitting: Speech Pathology

## 2016-08-30 MED FILL — ELIQUIS 5 MG TABLET: 5 | 30 days supply | Qty: 60 | Fill #4

## 2016-08-30 MED FILL — MECLIZINE 25 MG TABLET: 25 | 10 days supply | Qty: 30 | Fill #1

## 2016-09-03 ENCOUNTER — Other Ambulatory Visit: Payer: Self-pay | Admitting: Family Medicine

## 2016-09-03 ENCOUNTER — Telehealth: Payer: Self-pay | Admitting: Family Medicine

## 2016-09-03 MED ORDER — LISINOPRIL 2.5 MG PO TABS: 2.5000 mg | ORAL_TABLET | Freq: Every morning | ORAL | 1 refills | Status: DC

## 2016-09-03 MED FILL — TOPIRAMATE 25 MG TABLET: 25 | 30 days supply | Qty: 120 | Fill #9

## 2016-09-03 MED FILL — LISINOPRIL 2.5 MG TABLET: 2.5 | 90 days supply | Qty: 90 | Fill #0

## 2016-09-03 MED FILL — valACYclovir HCL 1 GM TABS: 1 | 30 days supply | Qty: 30 | Fill #2

## 2016-09-03 NOTE — Telephone Encounter (Signed)
Left message return call (refill sent into pharmacy)

## 2016-09-03 NOTE — Telephone Encounter (Signed)
Pt is needing a refill on his lisinopril     [facility]

## 2016-09-03 NOTE — Telephone Encounter (Signed)
Spoke with patient's wife and informed her that refills are sent into pharmacy. Patient's wife verbalized understanding.

## 2016-09-07 LAB — CUP PACEART REMOTE DEVICE CHECK
Implantable Pulse Generator Implant Date: 20161229
MDC IDC SESS DTM: 20180423181305

## 2016-09-09 ENCOUNTER — Other Ambulatory Visit: Payer: Self-pay | Admitting: Family Medicine

## 2016-09-09 MED FILL — ATORVASTATIN 20 MG TABLET: 20 | 90 days supply | Qty: 90 | Fill #0

## 2016-09-14 ENCOUNTER — Encounter: Payer: Self-pay | Admitting: Neurology

## 2016-09-14 ENCOUNTER — Ambulatory Visit (INDEPENDENT_AMBULATORY_CARE_PROVIDER_SITE_OTHER): Payer: 59 | Admitting: Neurology

## 2016-09-14 VITALS — BP 107/64 | HR 92 | Ht 73.0 in | Wt 165.0 lb

## 2016-09-14 DIAGNOSIS — E785 Hyperlipidemia, unspecified: Secondary | ICD-10-CM

## 2016-09-14 DIAGNOSIS — I480 Paroxysmal atrial fibrillation: Secondary | ICD-10-CM

## 2016-09-14 DIAGNOSIS — I10 Essential (primary) hypertension: Secondary | ICD-10-CM

## 2016-09-14 DIAGNOSIS — Q2112 Patent foramen ovale: Secondary | ICD-10-CM

## 2016-09-14 DIAGNOSIS — I6340 Cerebral infarction due to embolism of unspecified cerebral artery: Secondary | ICD-10-CM

## 2016-09-14 DIAGNOSIS — G4733 Obstructive sleep apnea (adult) (pediatric): Secondary | ICD-10-CM | POA: Diagnosis not present

## 2016-09-14 DIAGNOSIS — Q211 Atrial septal defect: Secondary | ICD-10-CM | POA: Diagnosis not present
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
## 2016-09-14 NOTE — Patient Instructions (Addendum)
-   continue eliquis, ASA and lipitor for stroke prevention  - follow up with PCP to arrange OSA treatment with nasal pillow - continue home exercise - Follow up with your primary care physician for stroke risk factor modification. Recommend maintain blood pressure goal <130/80, diabetes with hemoglobin A1c goal below 6.5% and lipids with LDL cholesterol goal below 70 mg/dL.  - discontinue topamax and lisinopril for concerning of side effects - check BP and glucose at home and record - follow up in 6 months.

## 2016-09-15 ENCOUNTER — Ambulatory Visit: Payer: 59 | Admitting: Neurology

## 2016-09-22 ENCOUNTER — Telehealth: Payer: Self-pay | Admitting: Family Medicine

## 2016-09-22 ENCOUNTER — Ambulatory Visit: Payer: 59 | Admitting: Family Medicine

## 2016-09-22 ENCOUNTER — Ambulatory Visit (INDEPENDENT_AMBULATORY_CARE_PROVIDER_SITE_OTHER): Payer: 59 | Admitting: *Deleted

## 2016-09-22 DIAGNOSIS — I63443 Cerebral infarction due to embolism of bilateral cerebellar arteries: Secondary | ICD-10-CM | POA: Diagnosis not present

## 2016-09-22 NOTE — Progress Notes (Signed)
[facility] Summary Report / Loop Recorder 

## 2016-09-22 NOTE — Telephone Encounter (Signed)
Pt requesting Labs for physical scheduled next week. Pt requesting PSA as well due to urinary concerns.

## 2016-09-24 ENCOUNTER — Ambulatory Visit: Payer: 59 | Admitting: Family Medicine

## 2016-09-24 ENCOUNTER — Other Ambulatory Visit: Payer: Self-pay | Admitting: *Deleted

## 2016-09-24 DIAGNOSIS — Z13220 Encounter for screening for lipoid disorders: Secondary | ICD-10-CM

## 2016-09-24 DIAGNOSIS — Z79899 Other long term (current) drug therapy: Secondary | ICD-10-CM

## 2016-09-24 DIAGNOSIS — Z125 Encounter for screening for malignant neoplasm of prostate: Secondary | ICD-10-CM

## 2016-09-24 DIAGNOSIS — E119 Type 2 diabetes mellitus without complications: Secondary | ICD-10-CM

## 2016-09-24 DIAGNOSIS — I10 Essential (primary) hypertension: Secondary | ICD-10-CM

## 2016-09-24 DIAGNOSIS — Z0000 Encounter for general adult medical examination without abnormal findings: Secondary | ICD-10-CM

## 2016-09-24 LAB — CUP PACEART REMOTE DEVICE CHECK
MDC IDC PG IMPLANT DT: 20161229
MDC IDC SESS DTM: 20180523180747

## 2016-09-24 NOTE — Telephone Encounter (Signed)
Orders ready. Pt notified on voicemail.

## 2016-09-24 NOTE — Telephone Encounter (Signed)
Lipid, liver, metabolic 7, urine ACR, PSA

## 2016-09-28 ENCOUNTER — Ambulatory Visit: Payer: 59 | Admitting: Family Medicine

## 2016-09-30 ENCOUNTER — Ambulatory Visit (INDEPENDENT_AMBULATORY_CARE_PROVIDER_SITE_OTHER): Payer: 59 | Admitting: Family Medicine

## 2016-09-30 ENCOUNTER — Ambulatory Visit (HOSPITAL_COMMUNITY)
Admission: RE | Admit: 2016-09-30 | Discharge: 2016-09-30 | Disposition: A | Discharge status: Home / Self Care | Payer: 59 | Source: Ambulatory Visit | Attending: Family Medicine | Admitting: Family Medicine

## 2016-09-30 ENCOUNTER — Encounter: Payer: Self-pay | Admitting: Family Medicine

## 2016-09-30 ENCOUNTER — Other Ambulatory Visit: Payer: Self-pay | Admitting: Family Medicine

## 2016-09-30 VITALS — BP 134/90 | Ht 73.0 in | Wt 161.5 lb

## 2016-09-30 DIAGNOSIS — R634 Abnormal weight loss: Secondary | ICD-10-CM

## 2016-09-30 DIAGNOSIS — R059 Cough, unspecified: Secondary | ICD-10-CM

## 2016-09-30 DIAGNOSIS — Z981 Arthrodesis status: Secondary | ICD-10-CM | POA: Diagnosis not present

## 2016-09-30 DIAGNOSIS — Z0000 Encounter for general adult medical examination without abnormal findings: Secondary | ICD-10-CM | POA: Diagnosis not present

## 2016-09-30 DIAGNOSIS — I480 Paroxysmal atrial fibrillation: Secondary | ICD-10-CM | POA: Diagnosis not present

## 2016-09-30 DIAGNOSIS — R05 Cough: Secondary | ICD-10-CM

## 2016-09-30 DIAGNOSIS — R350 Frequency of micturition: Secondary | ICD-10-CM

## 2016-09-30 DIAGNOSIS — G6289 Other specified polyneuropathies: Secondary | ICD-10-CM | POA: Diagnosis not present

## 2016-09-30 DIAGNOSIS — E119 Type 2 diabetes mellitus without complications: Secondary | ICD-10-CM

## 2016-09-30 DIAGNOSIS — R413 Other amnesia: Secondary | ICD-10-CM

## 2016-09-30 LAB — POCT URINALYSIS DIPSTICK
Glucose, UA: 500
Leukocytes, UA: NEGATIVE
SPEC GRAV UA: 1.02 (ref 1.010–1.025)
pH, UA: 5 (ref 5.0–8.0)

## 2016-09-30 MED FILL — ELIQUIS 5 MG TABLET: 5 | 30 days supply | Qty: 60 | Fill #5

## 2016-09-30 MED FILL — valACYclovir HCL 1 GM TABS: 1 | 30 days supply | Qty: 30 | Fill #3

## 2016-09-30 MED FILL — metFORMIN HCL 500 MG TABS: 500 | 90 days supply | Qty: 180 | Fill #1

## 2016-09-30 NOTE — Progress Notes (Signed)
Subjective:    Patient ID: Travis [name], male    DOB: 1960/09/06, 56 y.o.   MRN: [id number]  HPI The patient comes in today for a wellness visit.    A review of their health history was completed.  A review of medications was also completed.  Any needed refills;Yes  Eating habits:  Has currently been eating soft diet due to dental work.  Falls/  MVA accidents in past few months: No Regular exercise: Patient states he tries to do yard work to help with staying physical activity.   Specialist pt sees on regular basis: Cardiologist, Neurologist   Preventative health issues were discussed.   Additional concerns:  Has concerns of frequent urination for the past few months and weight loss.   Also   patient's wife left a note being concerned of patient's memory. She stated patient can not remember anything.   Results for orders placed or performed in visit on 09/30/16  POCT urinalysis dipstick  Result Value Ref Range   Color, UA Yellow    Clarity, UA     Glucose, UA 500    Bilirubin, UA     Ketones, UA     Spec Grav, UA 1.020 1.010 - 1.025   Blood, UA Moderate    pH, UA 5.0 5.0 - 8.0   Protein, UA     Urobilinogen, UA  0.2 or 1.0 E.U./dL   Nitrite, UA     Leukocytes, UA Negative Negative    Review of Systems  Constitutional: Negative for activity change, appetite change and fever.  HENT: Negative for congestion and rhinorrhea.   Eyes: Negative for discharge.  Respiratory: Negative for cough and wheezing.   Cardiovascular: Negative for chest pain.  Gastrointestinal: Negative for abdominal pain, blood in stool and vomiting.  Genitourinary: Positive for frequency and urgency. Negative for difficulty urinating.  Musculoskeletal: Negative for neck pain.  Skin: Negative for rash.  Allergic/Immunologic: Negative for environmental allergies and food allergies.  Neurological: Negative for weakness and headaches.  Psychiatric/Behavioral: Negative for agitation.         Objective:   Physical Exam  Constitutional: He appears well-developed and well-nourished.  HENT:  Head: Normocephalic and atraumatic.  Right Ear: External ear normal.  Left Ear: External ear normal.  Nose: Nose normal.  Mouth/Throat: Oropharynx is clear and moist.  Eyes: EOM are normal. Pupils are equal, round, and reactive to light.  Neck: Normal range of motion. Neck supple. No thyromegaly present.  Cardiovascular: Normal rate, regular rhythm and normal heart sounds.   No murmur heard. Pulmonary/Chest: Effort normal and breath sounds normal. No respiratory distress. He has no wheezes.  Abdominal: Soft. Bowel sounds are normal. He exhibits no distension and no mass. There is no tenderness.  Genitourinary: Penis normal.  Musculoskeletal: Normal range of motion. He exhibits no edema.  Lymphadenopathy:    He has no cervical adenopathy.  Neurological: He is alert. He exhibits normal muscle tone.  Skin: Skin is warm and dry. No erythema.  Psychiatric: He has a normal mood and affect. His behavior is normal. Judgment normal.          Assessment & Plan:  Increase urinary frequency along with weight loss we will conduct some labs. Await the results Cough-will go ahead and do chest x-ray especially with patient having some weight loss Patient will follow-up in several weeks if still having weight loss may need to have further testing  Adult wellness-complete.wellness physical was conducted today. Importance of diet and  exercise were discussed in detail. In addition to this a discussion regarding safety was also covered. We also reviewed over immunizations and gave recommendations regarding current immunization needed for age. In addition to this additional areas were also touched on including: Preventative health exams needed: Colonoscopy patient up-to-date on colonoscopy  Patient was advised yearly wellness exam Stool testing for blood ordered Patient does have diabetes states his  sugars been under good control we will check an A1c  It is possible his weight reduction was related to the Topamax he is stop taking this Patient very worried about lisinopril he thinks it's causing his cough although his cough has phlegm no hemoptysis we will stop the lisinopril patient may well need to have further medication for this we will wait on lab work  1 skin patient is going to be following up in the next few weeks

## 2016-09-30 NOTE — Telephone Encounter (Signed)
May have this plus for additional refills

## 2016-10-01 NOTE — Addendum Note (Signed)
Addended by: Launa Grill on: 10/01/2016 08:54 AM   Modules accepted: Orders

## 2016-10-04 ENCOUNTER — Other Ambulatory Visit: Payer: Self-pay | Admitting: Family Medicine

## 2016-10-04 MED FILL — FOLIC ACID 1 MG TABLET: 1 | 90 days supply | Qty: 90 | Fill #0

## 2016-10-07 DIAGNOSIS — R634 Abnormal weight loss: Secondary | ICD-10-CM | POA: Diagnosis not present

## 2016-10-08 LAB — TSH: TSH: 1.46 u[IU]/mL (ref 0.450–4.500)

## 2016-10-08 LAB — T4, FREE: Free T4: 1.21 ng/dL (ref 0.82–1.77)

## 2016-10-11 ENCOUNTER — Ambulatory Visit: Payer: 59 | Admitting: Neurology

## 2016-10-20 MED FILL — LYRICA 100 MG CAPSULE: 100 | 30 days supply | Qty: 90 | Fill #1

## 2016-10-22 ENCOUNTER — Ambulatory Visit (INDEPENDENT_AMBULATORY_CARE_PROVIDER_SITE_OTHER): Payer: 59 | Admitting: *Deleted

## 2016-10-22 DIAGNOSIS — I63443 Cerebral infarction due to embolism of bilateral cerebellar arteries: Secondary | ICD-10-CM

## 2016-10-25 NOTE — Progress Notes (Signed)
[facility] Summary Report / Loop Recorder 

## 2016-10-27 ENCOUNTER — Telehealth: Payer: Self-pay | Admitting: Family Medicine

## 2016-10-27 NOTE — Telephone Encounter (Signed)
Pt is needing a letter stating that he is medically unable to look after a toddler. Pt and wife recently got custody of their grandchild and in order for them to be able to get daycare assistance they are needing this letter stating that he is unable to take care of her while his wife is at work. Please advise.

## 2016-10-29 LAB — CUP PACEART REMOTE DEVICE CHECK
Date Time Interrogation Session: 20180622184120
Implantable Pulse Generator Implant Date: 20161229

## 2016-10-31 ENCOUNTER — Encounter: Payer: Self-pay | Admitting: Family Medicine

## 2016-10-31 NOTE — Telephone Encounter (Signed)
Letter was dictated and signed please forward to the patient

## 2016-11-01 ENCOUNTER — Other Ambulatory Visit: Payer: Self-pay | Admitting: Neurology

## 2016-11-01 MED FILL — MECLIZINE 25 MG TABLET: 25 | 10 days supply | Qty: 30 | Fill #2

## 2016-11-01 MED FILL — valACYclovir HCL 1 GM TABS: 1 | 30 days supply | Qty: 30 | Fill #4

## 2016-11-01 MED FILL — ELIQUIS 5 MG TABLET: 5 | 30 days supply | Qty: 60 | Fill #6

## 2016-11-04 ENCOUNTER — Other Ambulatory Visit: Payer: Self-pay | Admitting: Neurology

## 2016-11-10 ENCOUNTER — Telehealth: Payer: Self-pay | Admitting: Cardiology

## 2016-11-10 NOTE — Telephone Encounter (Signed)
LMOVM requesting that pt send manual transmission b/c home monitor has not updated in at least 14 days.    

## 2016-11-22 ENCOUNTER — Ambulatory Visit (INDEPENDENT_AMBULATORY_CARE_PROVIDER_SITE_OTHER): Payer: 59 | Admitting: *Deleted

## 2016-11-22 DIAGNOSIS — I63443 Cerebral infarction due to embolism of bilateral cerebellar arteries: Secondary | ICD-10-CM

## 2016-11-23 NOTE — Progress Notes (Signed)
[facility] Summary Report / Loop Recorder 

## 2016-11-24 ENCOUNTER — Telehealth: Payer: Self-pay | Admitting: Cardiology

## 2016-11-24 ENCOUNTER — Ambulatory Visit (INDEPENDENT_AMBULATORY_CARE_PROVIDER_SITE_OTHER): Payer: 59 | Admitting: Family Medicine

## 2016-11-24 ENCOUNTER — Encounter: Payer: Self-pay | Admitting: Family Medicine

## 2016-11-24 VITALS — BP 118/78 | Ht 73.0 in | Wt 166.0 lb

## 2016-11-24 DIAGNOSIS — E784 Other hyperlipidemia: Secondary | ICD-10-CM | POA: Diagnosis not present

## 2016-11-24 DIAGNOSIS — I10 Essential (primary) hypertension: Secondary | ICD-10-CM | POA: Diagnosis not present

## 2016-11-24 DIAGNOSIS — E7849 Other hyperlipidemia: Secondary | ICD-10-CM

## 2016-11-24 IMAGING — MR MR CERVICAL SPINE W/O CM
4 of 7 series · 17 of 48 positions shown · non-contrast
Comparison: Cervical MRI 11/15/2005

CLINICAL DATA: Vertigo with neck movement.  Cervical fusion

EXAM:
MRI CERVICAL SPINE WITHOUT CONTRAST
TECHNIQUE: Multiplanar, multisequence MR imaging of the cervical spine was
performed. No intravenous contrast was administered.

[Series 3: T2 · sagittal · 3.0mm · 0.41mm/px · 4 of 13 slices shown (1 of 3)]
[im 1/13]
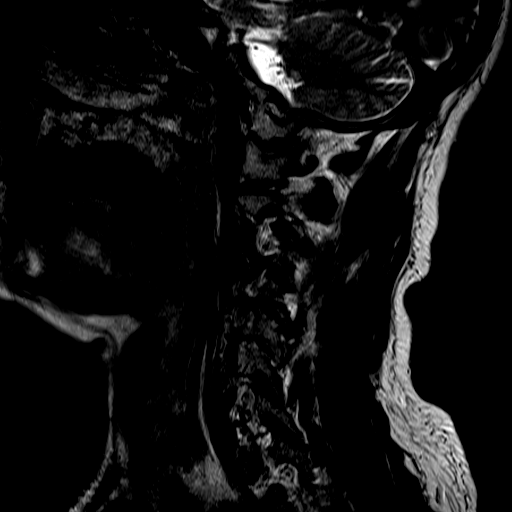
[im 5/13]
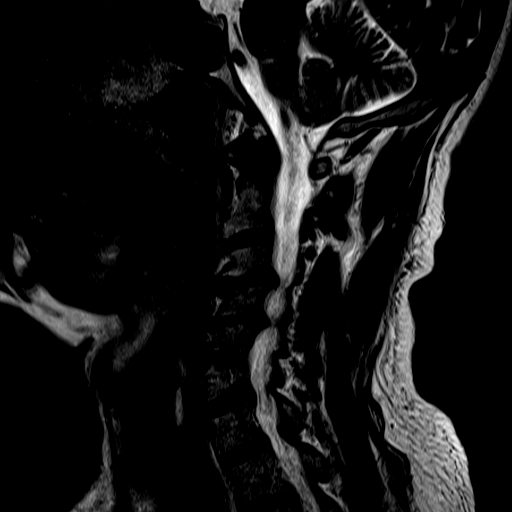
[im 9/13]
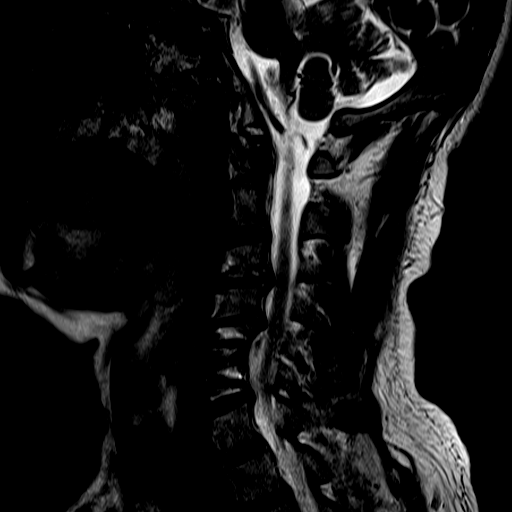
[im 13/13]
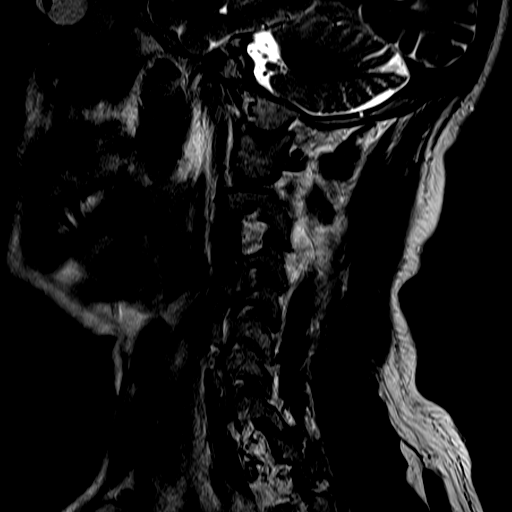

[Series 7: T2 · axial · 3.0mm · 0.27mm/px · z∈[-82,+21]mm · 7 of 40 slices shown (2 of 3)]
[im 1/40]
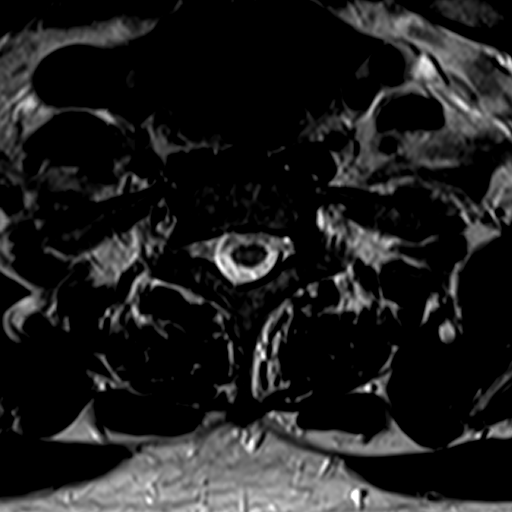
[im 7/40]
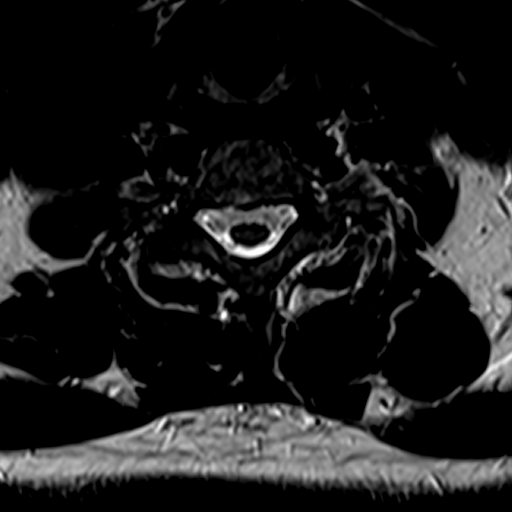
[im 14/40]
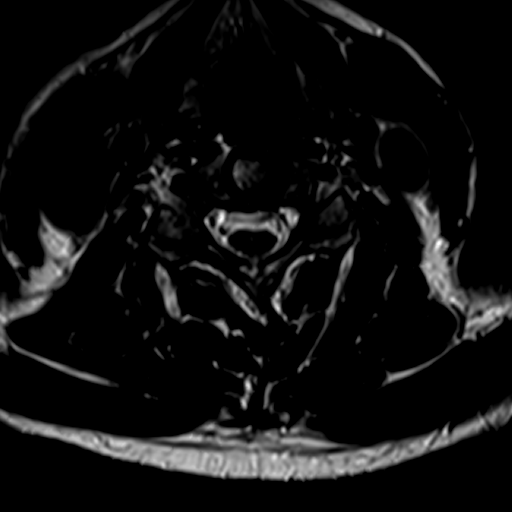
[im 17/40]
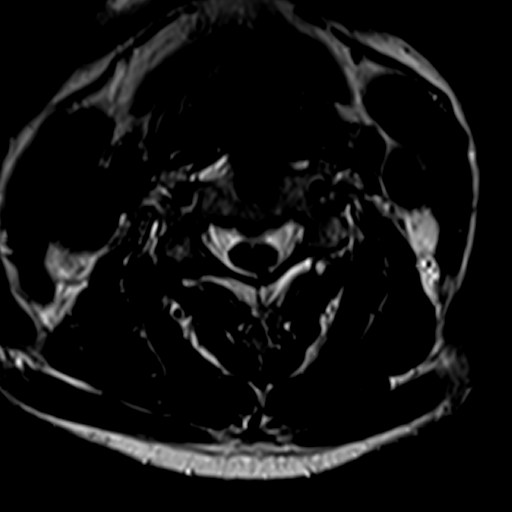
[im 20/40]
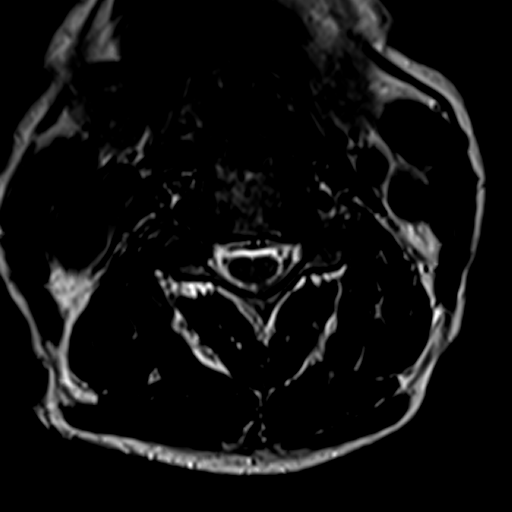
[im 23/40]
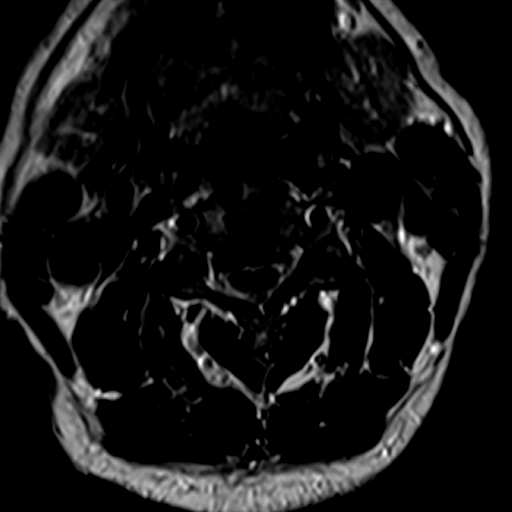
[im 33/40]
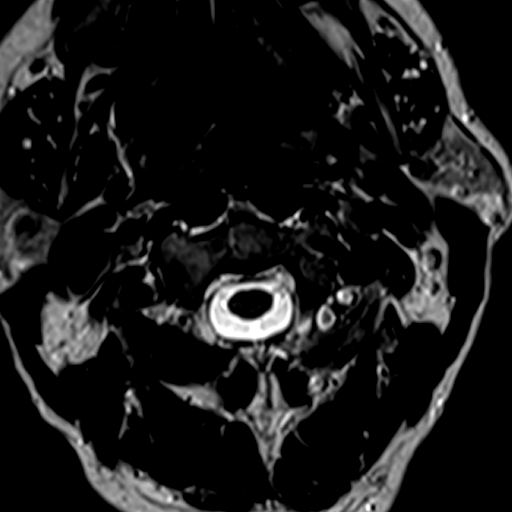

[Series 8: T2 · sagittal · 3.0mm · 0.34mm/px · 3 of 15 slices shown (3 of 3)]
[im 1/15]
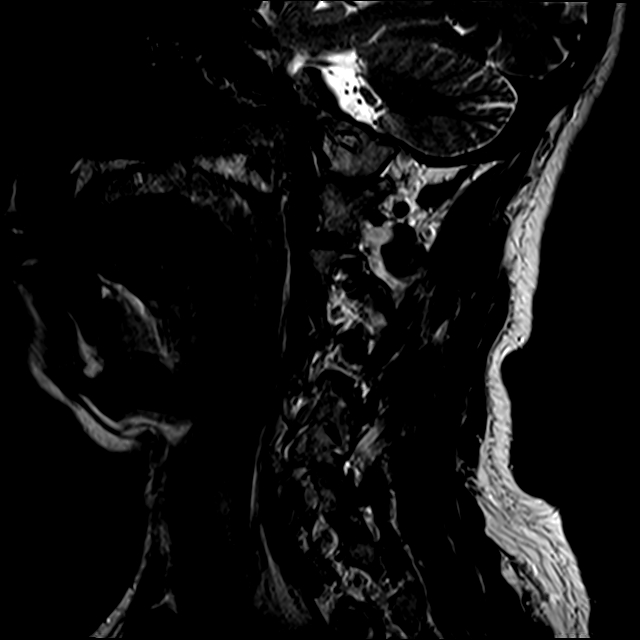
[im 8/15]
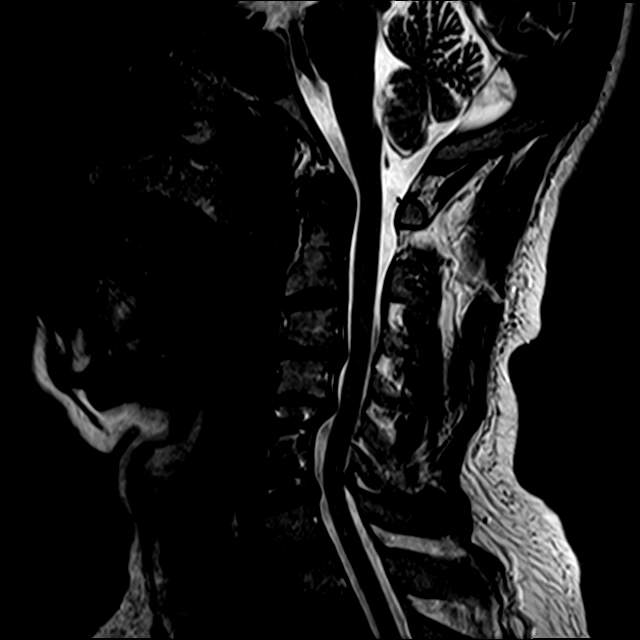
[im 15/15]
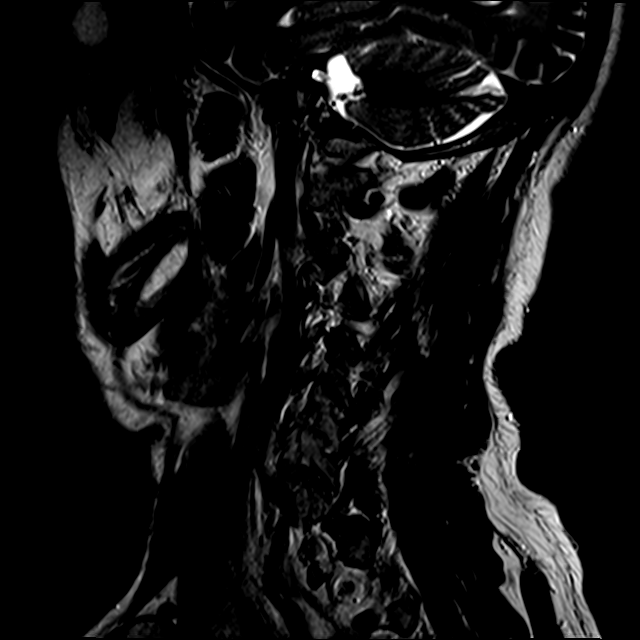

[Series 9: T1 · sagittal · 3.0mm · 0.47mm/px · 3 of 15 slices shown]
[im 1/15]
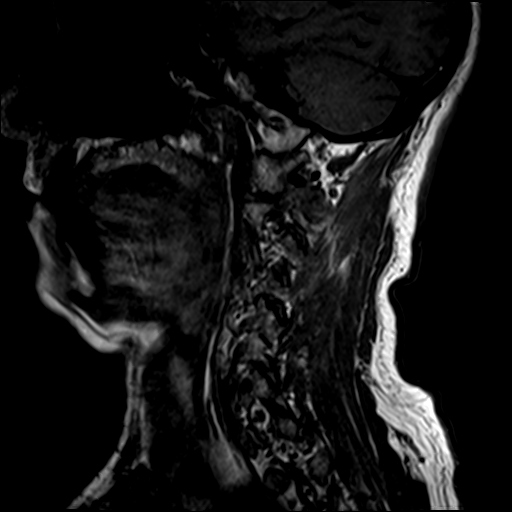
[im 8/15]
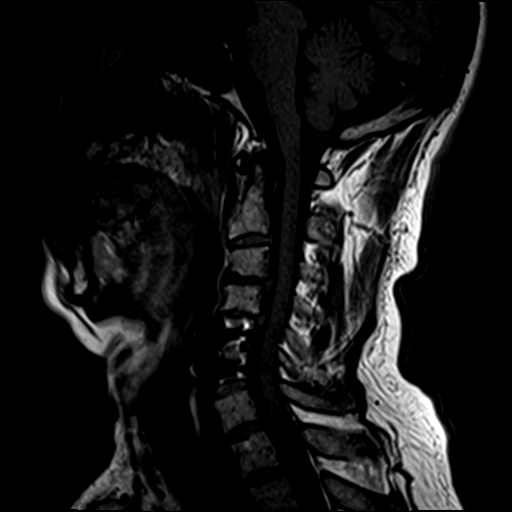
[im 15/15]
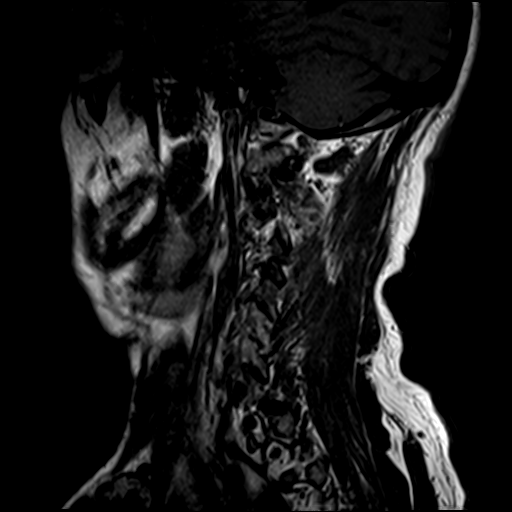

[17 of 48 positions shown; findings below may reference images not displayed]

FINDINGS: Interval ACDF at C5-6. Solid fusion. Straightening of the cervical
lordosis. Negative for fracture or mass. Spinal cord signal normal.
Cervical medullary junction normal.

C2-3: Mild facet degeneration bilaterally without significant spinal
stenosis. Mild foraminal narrowing bilaterally

C3-4: Advanced disc degeneration and spondylosis with progression
from the prior study. Diffuse uncinate spurring causing mild spinal
stenosis and moderate foraminal encroachment bilaterally

C4-5: Advanced disc degeneration and spondylosis with progression
since the prior study. Mild spinal stenosis and moderate foraminal
encroachment bilaterally. Bilateral facet hypertrophy.

C5-6:  Solid ACDF without stenosis

C6-7: Disc degeneration and spondylosis left greater than right has
progressed. Left foraminal encroachment due to disc protrusion and
osteophyte. Bilateral facet hypertrophy. Mild spinal stenosis.

Vertebral artery appears patent bilaterally. Carotid artery appears
patent bilaterally. No mass in the neck.
IMPRESSION: Solid fusion at C5-6 with ACDF plate noted.

Progressive disc degeneration and spondylosis at C3-4 and C4-5 with
spinal and foraminal encroachment

Left foraminal encroachment at C6-7 related to disc protrusion and
osteophyte. This could cause left C7 nerve root symptoms.

## 2016-11-24 NOTE — Telephone Encounter (Signed)
Spoke w/ pt wife and requested that patient send a manual transmission w/ his home monitor b/c his home monitor has not updated in 14 days. Pt wife verbalized understanding.

## 2016-11-24 NOTE — Progress Notes (Signed)
   Subjective:    Patient ID: LETCHER [name], male    DOB: 1961-03-20, 56 y.o.   MRN: [id number]  Hypertension  This is a chronic problem. The current episode started more than 1 year ago. Pertinent negatives include no chest pain.   States he eats healthy, he keeps active by doing yard work. States he thinks his blood pressures have been doing pretty good as of late.  No other concerns.  Review of Systems  Constitutional: Negative for activity change, appetite change and fatigue.  HENT: Negative for congestion.   Respiratory: Negative for cough.   Cardiovascular: Negative for chest pain.  Gastrointestinal: Negative for abdominal pain.  Endocrine: Negative for polydipsia and polyphagia.  Neurological: Negative for weakness.  Psychiatric/Behavioral: Negative for confusion.       Objective:   Physical Exam  Constitutional: He appears well-nourished. No distress.  Cardiovascular: Normal rate, regular rhythm and normal heart sounds.   No murmur heard. Pulmonary/Chest: Effort normal and breath sounds normal. No respiratory distress.  Musculoskeletal: He exhibits no edema.  Lymphadenopathy:    He has no cervical adenopathy.  Neurological: He is alert.  Psychiatric: His behavior is normal.  Vitals reviewed.         Assessment & Plan:  Diabetes decent control Patient has not done his lab work he was encouraged to do so Blood pressure good control History of stroke.  See patient back in approximately 4 months patient encouraged to get his lab work completed

## 2016-11-29 MED FILL — valACYclovir HCL 1 GM TABS: 1 | 30 days supply | Qty: 30 | Fill #5

## 2016-11-29 MED FILL — ELIQUIS 5 MG TABLET: 5 | 30 days supply | Qty: 60 | Fill #0

## 2016-11-29 MED FILL — MECLIZINE 25 MG TABLET: 25 | 10 days supply | Qty: 30 | Fill #3

## 2016-11-29 MED FILL — LYRICA 100 MG CAPSULE: 100 | 30 days supply | Qty: 90 | Fill #2

## 2016-12-02 ENCOUNTER — Other Ambulatory Visit: Payer: Self-pay | Admitting: *Deleted

## 2016-12-02 DIAGNOSIS — R634 Abnormal weight loss: Secondary | ICD-10-CM

## 2016-12-02 DIAGNOSIS — Z0000 Encounter for general adult medical examination without abnormal findings: Secondary | ICD-10-CM

## 2016-12-02 LAB — IFOBT (OCCULT BLOOD): IFOBT: NEGATIVE

## 2016-12-03 ENCOUNTER — Telehealth: Payer: Self-pay | Admitting: Cardiology

## 2016-12-03 NOTE — Telephone Encounter (Signed)
Spoke w/ pt wife and requested that he send a manual transmission b/c his home monitor has not updated in at least 7 days.   

## 2016-12-06 LAB — CUP PACEART REMOTE DEVICE CHECK
Implantable Pulse Generator Implant Date: 20161229
MDC IDC SESS DTM: 20180722193951

## 2016-12-06 NOTE — Progress Notes (Signed)
Carelink summary report received. Battery status OK. Normal device function. No new symptom episodes, tachy episodes, brady, or pause episodes. No new AF episodes. Monthly summary reports and ROV/PRN 

## 2016-12-08 ENCOUNTER — Ambulatory Visit: Payer: 59 | Admitting: Family Medicine

## 2016-12-08 ENCOUNTER — Encounter: Payer: Self-pay | Admitting: Cardiology

## 2016-12-10 ENCOUNTER — Other Ambulatory Visit: Payer: Self-pay | Admitting: Family Medicine

## 2016-12-13 ENCOUNTER — Telehealth: Payer: Self-pay | Admitting: Family Medicine

## 2016-12-13 MED ORDER — ATORVASTATIN CALCIUM 20 MG PO TABS: 20.0000 mg | ORAL_TABLET | Freq: Every day | ORAL | 0 refills | Status: DC

## 2016-12-13 MED FILL — ATORVASTATIN 20 MG TABLET: 20 | 90 days supply | Qty: 90 | Fill #0

## 2016-12-13 NOTE — Telephone Encounter (Signed)
Pt is needing refills on  atorvastatin (LIPITOR) 20 MG tablet    [facility]

## 2016-12-13 NOTE — Telephone Encounter (Signed)
Prescription sent electronically to pharmacy. Patient notified. 

## 2016-12-15 ENCOUNTER — Other Ambulatory Visit: Payer: Self-pay | Admitting: Family Medicine

## 2016-12-15 NOTE — Telephone Encounter (Signed)
May do this +5 refills

## 2016-12-16 MED FILL — ALPRAZolam 0.25 MG TABS: 0.25 | 30 days supply | Qty: 60 | Fill #0

## 2016-12-21 ENCOUNTER — Ambulatory Visit (INDEPENDENT_AMBULATORY_CARE_PROVIDER_SITE_OTHER): Payer: 59 | Admitting: *Deleted

## 2016-12-21 DIAGNOSIS — I63443 Cerebral infarction due to embolism of bilateral cerebellar arteries: Secondary | ICD-10-CM | POA: Diagnosis not present

## 2016-12-22 NOTE — Progress Notes (Signed)
[facility] Summary Report / Loop Recorder 

## 2016-12-26 LAB — CUP PACEART REMOTE DEVICE CHECK
Date Time Interrogation Session: 20180821193902
MDC IDC PG IMPLANT DT: 20161229

## 2016-12-29 MED FILL — ELIQUIS 5 MG TABLET: 5 | 30 days supply | Qty: 60 | Fill #1

## 2016-12-29 MED FILL — FOLIC ACID 1 MG TABLET: 1 | 90 days supply | Qty: 90 | Fill #1

## 2016-12-29 MED FILL — valACYclovir HCL 1 GM TABS: 1 | 30 days supply | Qty: 30 | Fill #6

## 2017-01-05 ENCOUNTER — Telehealth: Payer: Self-pay | Admitting: Family Medicine

## 2017-01-05 ENCOUNTER — Other Ambulatory Visit: Payer: Self-pay | Admitting: Family Medicine

## 2017-01-05 MED ORDER — TRAMADOL HCL 50 MG PO TABS: ORAL_TABLET | ORAL | 0 refills | Status: DC

## 2017-01-05 NOTE — Telephone Encounter (Signed)
Last filled 10/2016 last seen 10/2016

## 2017-01-05 NOTE — Telephone Encounter (Signed)
Requesting Rx for Tramadol to [facility].

## 2017-01-05 NOTE — Telephone Encounter (Signed)
Prescription faxed to pharmacy.

## 2017-01-05 NOTE — Telephone Encounter (Signed)
Ok times one 

## 2017-01-05 NOTE — Telephone Encounter (Signed)
Last seen 11/24/16

## 2017-01-18 ENCOUNTER — Other Ambulatory Visit: Payer: Self-pay | Admitting: Family Medicine

## 2017-01-18 MED FILL — MECLIZINE 25 MG TABLET: 25 | 10 days supply | Qty: 30 | Fill #0

## 2017-01-18 MED FILL — metFORMIN HCL 500 MG TABS: 500 | 90 days supply | Qty: 180 | Fill #0

## 2017-01-18 MED FILL — ALPRAZolam 0.25 MG TABS: 0.25 | 30 days supply | Qty: 60 | Fill #1

## 2017-01-19 ENCOUNTER — Other Ambulatory Visit: Payer: Self-pay

## 2017-01-19 MED ORDER — PREGABALIN 100 MG PO CAPS: 100.0000 mg | ORAL_CAPSULE | Freq: Three times a day (TID) | ORAL | 1 refills | Status: DC

## 2017-01-19 MED FILL — LYRICA 100 MG CAPSULE: 100 | 90 days supply | Qty: 270 | Fill #0

## 2017-01-19 NOTE — Progress Notes (Signed)
Medication sent into pharmacy  

## 2017-01-19 NOTE — Progress Notes (Signed)
This with 3 refills

## 2017-01-20 ENCOUNTER — Ambulatory Visit (INDEPENDENT_AMBULATORY_CARE_PROVIDER_SITE_OTHER): Payer: 59 | Admitting: *Deleted

## 2017-01-20 DIAGNOSIS — I63443 Cerebral infarction due to embolism of bilateral cerebellar arteries: Secondary | ICD-10-CM | POA: Diagnosis not present

## 2017-01-21 LAB — CUP PACEART REMOTE DEVICE CHECK
Date Time Interrogation Session: 20180920200952
MDC IDC PG IMPLANT DT: 20161229

## 2017-01-21 NOTE — Progress Notes (Signed)
[facility] Summary Report / Loop Recorder 

## 2017-01-24 ENCOUNTER — Other Ambulatory Visit: Payer: Self-pay | Admitting: Family Medicine

## 2017-01-24 NOTE — Telephone Encounter (Signed)
This plus 2 refills

## 2017-01-26 IMAGING — CT CT HEAD W/O CM
1 series · 16 of 30 positions shown, 20 images · non-contrast
Comparison: CT scan of October 14, 2014.

CLINICAL DATA: Slurred speech.

EXAM:
CT HEAD WITHOUT CONTRAST
TECHNIQUE: Contiguous axial images were obtained from the base of the skull
through the vertex without intravenous contrast.

[Series 2: headtrauma 4.8 h37s · axial · 0.49mm/px · z∈[+166,+328]mm · 16 of 36 slices shown, 20 images]
[im 2/36  brain]
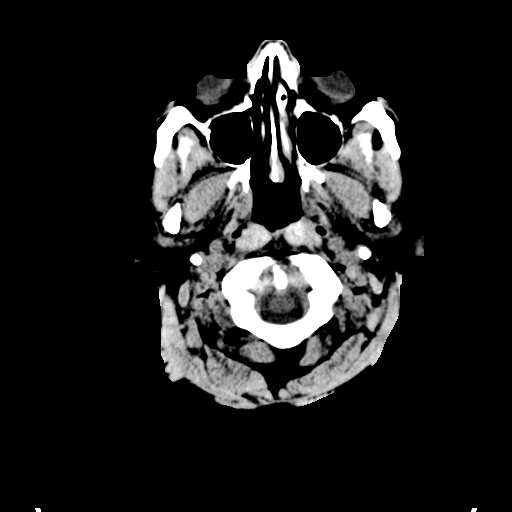
[im 2/36  bone]
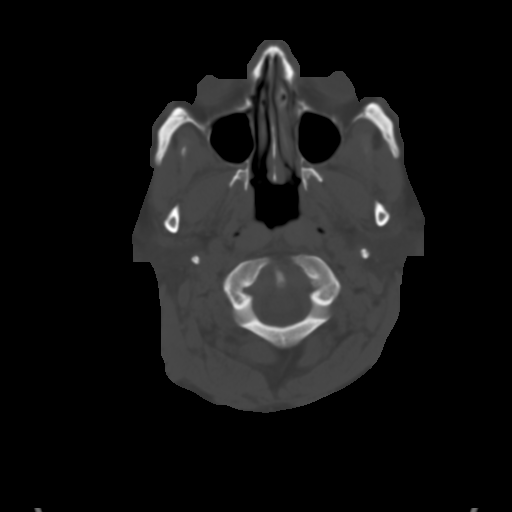
[im 4/36  brain]
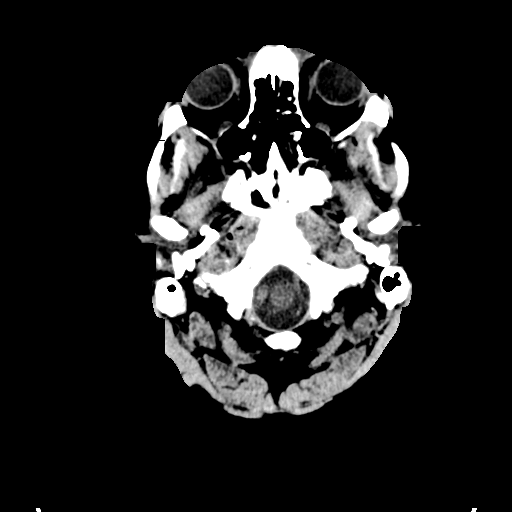
[im 7/36  brain]
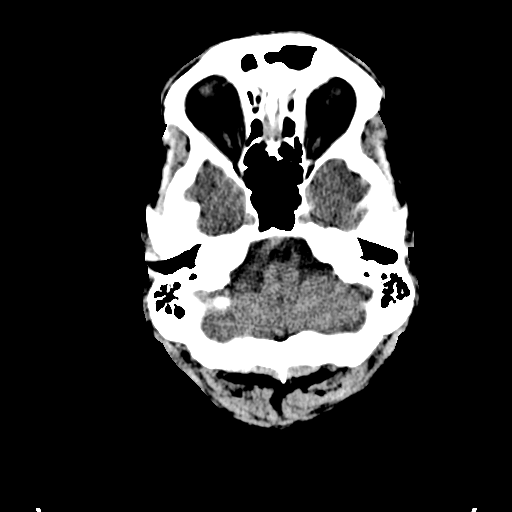
[im 9/36  brain]
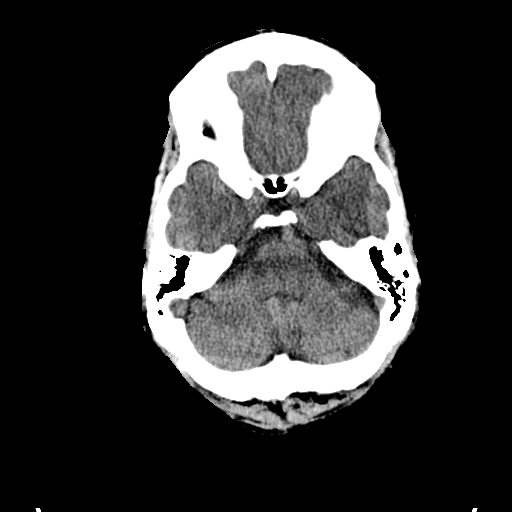
[im 10/36  brain]
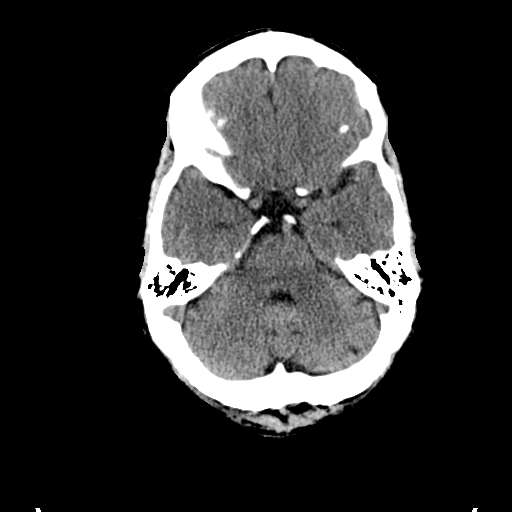
[im 10/36  bone]
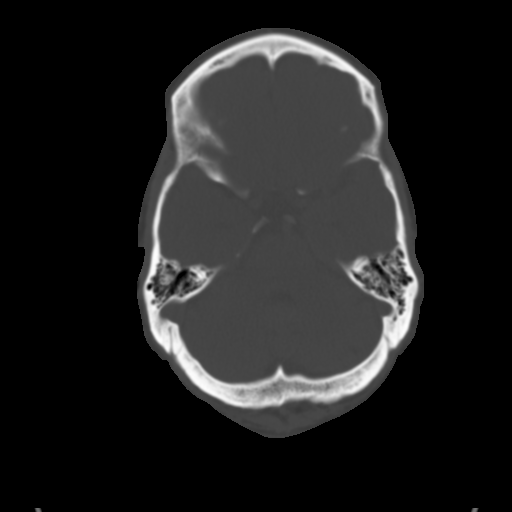
[im 13/36  brain]
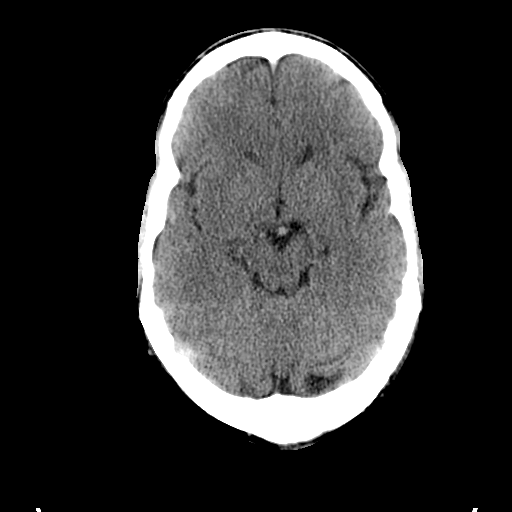
[im 15/36  brain]
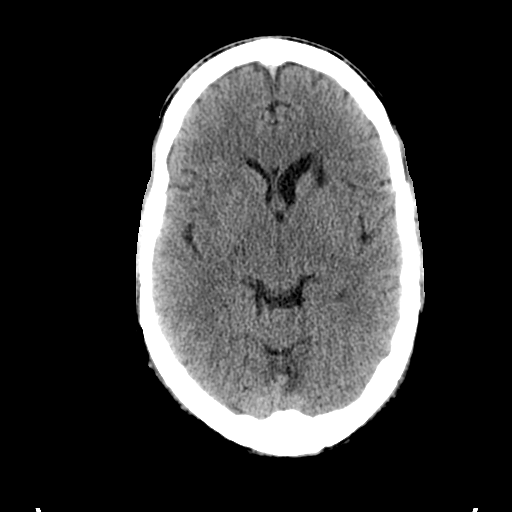
[im 17/36  brain]
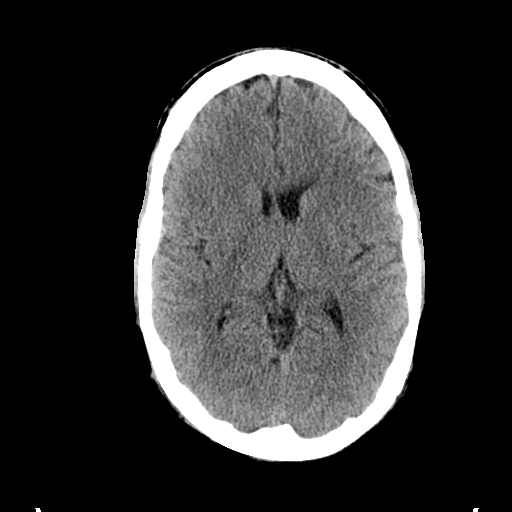
[im 19/36  brain]
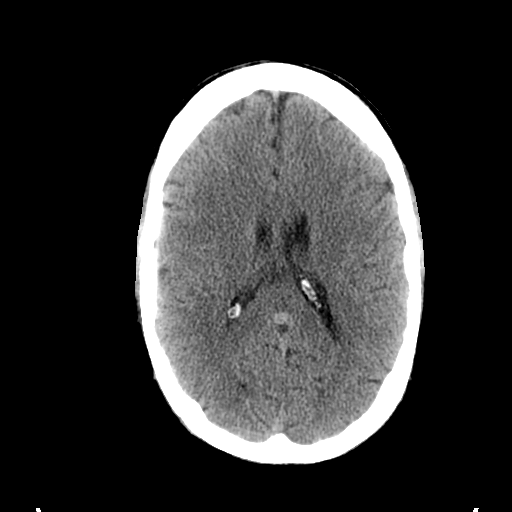
[im 19/36  bone]
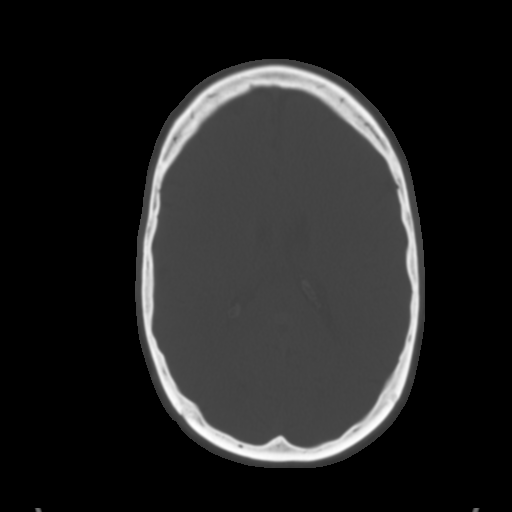
[im 21/36  brain]
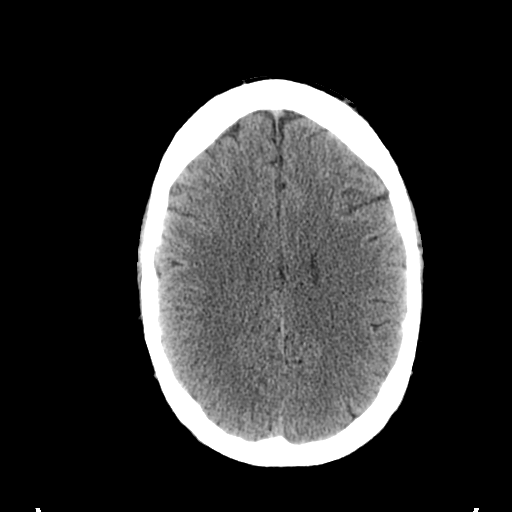
[im 23/36  brain]
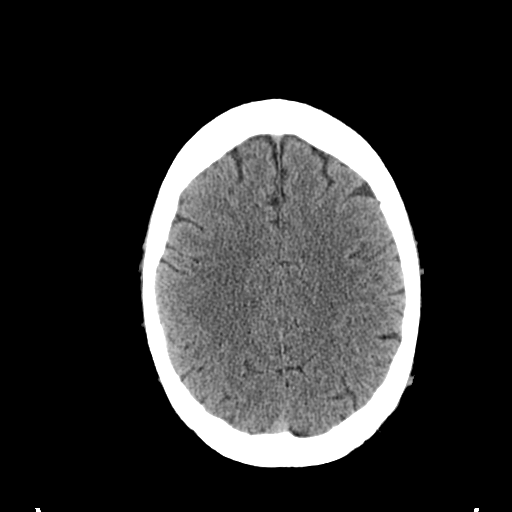
[im 26/36  brain]
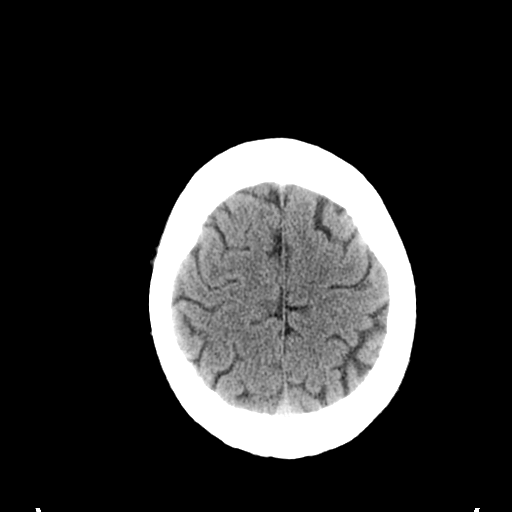
[im 27/36  brain]
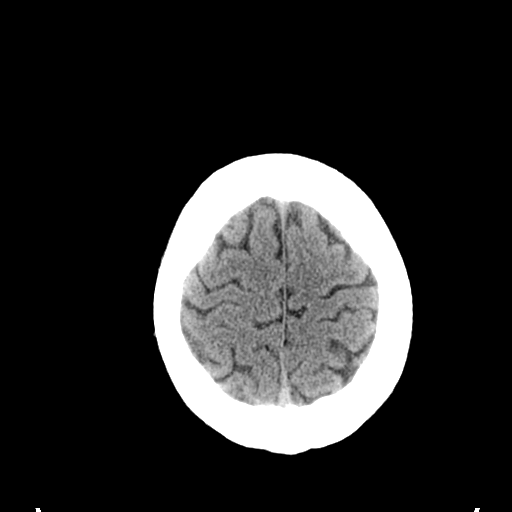
[im 27/36  bone]
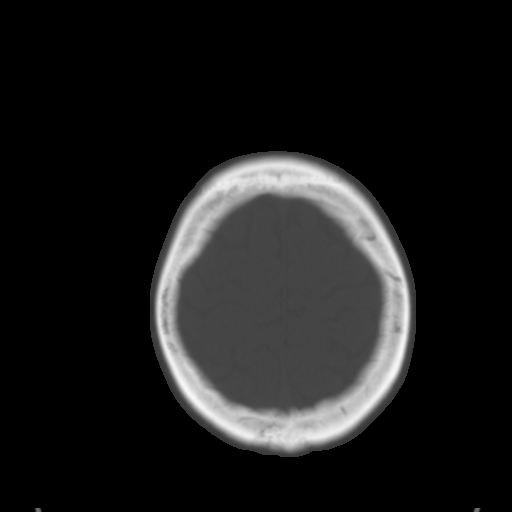
[im 29/36  brain]
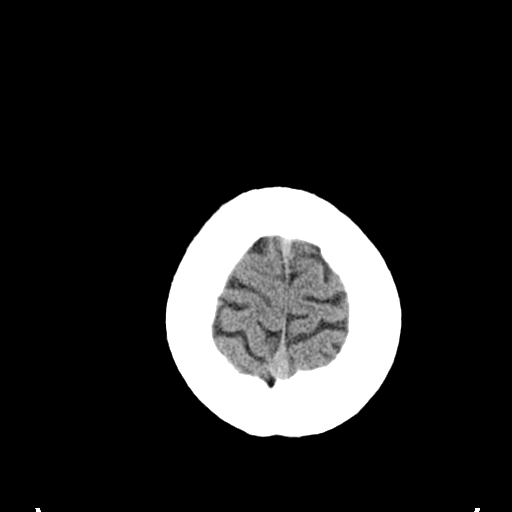
[im 32/36  brain]
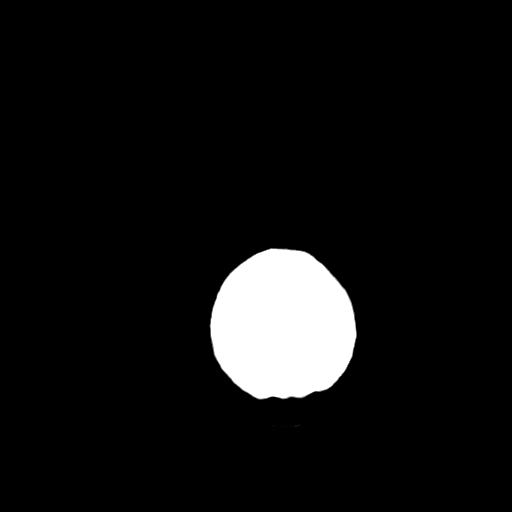
[im 34/36  brain]
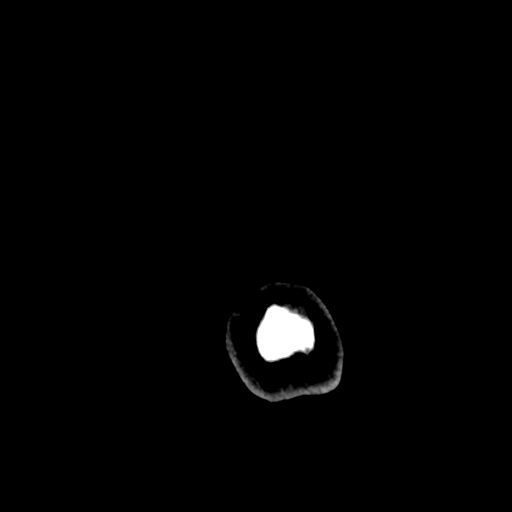

[16 of 30 positions shown; findings below may reference images not displayed]

FINDINGS: Bony calvarium appears intact. Old infarction involving the left
caudate nucleus is again noted. Stable old infarctions involving
both cerebellar hemispheres are noted as well. No mass effect or
midline shift is noted. Ventricular size is within normal limits.
There is no evidence of mass lesion, hemorrhage or acute infarction.
IMPRESSION: Stable old left caudate nucleus and bilateral cerebellar
infarctions. No acute intracranial abnormality seen. These results
were called by telephone at the time of interpretation on 02/28/2015
at [DATE] to Dr. BLAIN JUMPER , who verbally acknowledged these
results.

## 2017-01-31 MED FILL — ELIQUIS 5 MG TABLET: 5 | 30 days supply | Qty: 60 | Fill #2

## 2017-02-11 ENCOUNTER — Telehealth: Payer: Self-pay | Admitting: Family Medicine

## 2017-02-11 ENCOUNTER — Other Ambulatory Visit: Payer: Self-pay | Admitting: Family Medicine

## 2017-02-11 MED ORDER — TRAMADOL HCL 50 MG PO TABS: ORAL_TABLET | ORAL | 0 refills | Status: DC

## 2017-02-11 MED FILL — traMADol HCL 50 MG TABS: 50 | 11 days supply | Qty: 35 | Fill #0

## 2017-02-11 NOTE — Telephone Encounter (Signed)
Form faxed

## 2017-02-11 NOTE — Telephone Encounter (Signed)
Last seen 11/24/2016 for HTN. Please advise.

## 2017-02-11 NOTE — Telephone Encounter (Signed)
Requesting Rx Tramadol to [facility].

## 2017-02-11 NOTE — Telephone Encounter (Signed)
Ok times one 

## 2017-02-11 NOTE — Telephone Encounter (Signed)
Patient is aware rx faxed to pharmacy.

## 2017-02-11 NOTE — Telephone Encounter (Signed)
Awaiting signature and will fax 

## 2017-02-16 ENCOUNTER — Other Ambulatory Visit: Payer: Self-pay | Admitting: *Deleted

## 2017-02-16 MED ORDER — TRAMADOL HCL 50 MG PO TABS: ORAL_TABLET | ORAL | 4 refills | Status: DC

## 2017-02-16 NOTE — Telephone Encounter (Signed)
May have 4 refills 

## 2017-02-17 ENCOUNTER — Other Ambulatory Visit: Payer: Self-pay | Admitting: Family Medicine

## 2017-02-17 MED FILL — MECLIZINE 25 MG TABLET: 25 | 10 days supply | Qty: 30 | Fill #1

## 2017-02-17 MED FILL — ALPRAZolam 0.25 MG TABS: 0.25 | 30 days supply | Qty: 60 | Fill #2

## 2017-02-17 NOTE — Telephone Encounter (Signed)
Last seen 11/24/2016 for HTN.

## 2017-02-18 MED FILL — valACYclovir HCL 1 GM TABS: 1 | 30 days supply | Qty: 90 | Fill #0

## 2017-02-21 ENCOUNTER — Ambulatory Visit (INDEPENDENT_AMBULATORY_CARE_PROVIDER_SITE_OTHER): Payer: 59 | Admitting: *Deleted

## 2017-02-21 DIAGNOSIS — I63443 Cerebral infarction due to embolism of bilateral cerebellar arteries: Secondary | ICD-10-CM

## 2017-02-21 NOTE — Progress Notes (Signed)
[facility] Summary Report / Loop Recorder 

## 2017-02-22 ENCOUNTER — Ambulatory Visit: Payer: 59

## 2017-02-23 ENCOUNTER — Ambulatory Visit (INDEPENDENT_AMBULATORY_CARE_PROVIDER_SITE_OTHER): Payer: 59 | Admitting: *Deleted

## 2017-02-23 DIAGNOSIS — Z23 Encounter for immunization: Secondary | ICD-10-CM | POA: Diagnosis not present

## 2017-02-24 LAB — CUP PACEART REMOTE DEVICE CHECK
Implantable Pulse Generator Implant Date: 20161229
MDC IDC SESS DTM: 20181020204121

## 2017-03-01 ENCOUNTER — Other Ambulatory Visit: Payer: Self-pay | Admitting: Family Medicine

## 2017-03-01 MED FILL — ELIQUIS 5 MG TABLET: 5 | 30 days supply | Qty: 60 | Fill #3

## 2017-03-01 MED FILL — MECLIZINE 25 MG TABLET: 25 | 10 days supply | Qty: 30 | Fill #2

## 2017-03-02 ENCOUNTER — Other Ambulatory Visit: Payer: Self-pay | Admitting: *Deleted

## 2017-03-02 NOTE — Telephone Encounter (Signed)
They had this +1 refill needs follow-up visit by the end of the year

## 2017-03-03 MED ORDER — TRAMADOL HCL 50 MG PO TABS: ORAL_TABLET | ORAL | 1 refills | Status: DC

## 2017-03-03 MED FILL — traMADol HCL 50 MG TABS: 50 | 11 days supply | Qty: 35 | Fill #0

## 2017-03-10 ENCOUNTER — Encounter: Payer: Self-pay | Admitting: Family Medicine

## 2017-03-10 ENCOUNTER — Ambulatory Visit (INDEPENDENT_AMBULATORY_CARE_PROVIDER_SITE_OTHER): Payer: 59 | Admitting: Family Medicine

## 2017-03-10 VITALS — BP 116/78 | Ht 73.0 in | Wt 168.4 lb

## 2017-03-10 DIAGNOSIS — I480 Paroxysmal atrial fibrillation: Secondary | ICD-10-CM

## 2017-03-10 DIAGNOSIS — R5383 Other fatigue: Secondary | ICD-10-CM | POA: Diagnosis not present

## 2017-03-10 DIAGNOSIS — I10 Essential (primary) hypertension: Secondary | ICD-10-CM

## 2017-03-10 DIAGNOSIS — Z125 Encounter for screening for malignant neoplasm of prostate: Secondary | ICD-10-CM

## 2017-03-10 DIAGNOSIS — E7849 Other hyperlipidemia: Secondary | ICD-10-CM

## 2017-03-10 DIAGNOSIS — E119 Type 2 diabetes mellitus without complications: Secondary | ICD-10-CM

## 2017-03-10 DIAGNOSIS — Z79899 Other long term (current) drug therapy: Secondary | ICD-10-CM | POA: Diagnosis not present

## 2017-03-10 NOTE — Progress Notes (Signed)
   Subjective:    Patient ID: Travis [name], male    DOB: 08/11/1960, 56 y.o.   MRN: [id number]  Hypertension  This is a chronic problem. The current episode started more than 1 year ago. Pertinent negatives include no chest pain. Risk factors for coronary artery disease include male gender and dyslipidemia. Past treatments include nothing. There are no compliance problems.    Patient has not had blood work done but plans to go soon. Left fingers and hands bothering him and getting tight. This patient's had previous stroke he finds himself at times feeling like his hands a little clumsy denies any numbness or tingling.  Patient watch his diet takes his cholesterol medicine regular basis Patient does take his diabetes medicine watch his diet as best he can does not check sugars frequently Does take blood thinner because of atrial fibrillation denies any rapid ventricular response   Review of Systems  Constitutional: Negative for activity change, appetite change and fatigue.  HENT: Negative for congestion.   Respiratory: Negative for cough.   Cardiovascular: Negative for chest pain.  Gastrointestinal: Negative for abdominal pain.  Endocrine: Negative for polydipsia and polyphagia.  Neurological: Negative for weakness.  Psychiatric/Behavioral: Negative for confusion.       Objective:   Physical Exam  Constitutional: He appears well-nourished. No distress.  Cardiovascular: Normal rate, regular rhythm and normal heart sounds.  No murmur heard. Pulmonary/Chest: Effort normal and breath sounds normal. No respiratory distress.  Musculoskeletal: He exhibits no edema.  Lymphadenopathy:    He has no cervical adenopathy.  Neurological: He is alert.  Psychiatric: His behavior is normal.  Vitals reviewed.  Diabetic foot exam normal   25 minutes was spent with the patient. Greater than half the time was spent in discussion and answering questions and counseling regarding the issues that  the patient came in for today.     Assessment & Plan:  Blood pressure good control watch diet stay active continue medication  Atrial fibrillation under good control continue Eliquis to prevent strokes  Hyperlipidemia needs to do his lab work previous labs reviewed continue current measures  Diabetes needs to do his lab work continue current measures watch diet closely stay active  Recheck in 6 months sooner problems will need wellness's months with diabetic check up  I recommend use Ultram only to help with discomfort avoid stronger pain medicine at this point for his back

## 2017-03-11 MED FILL — ATORVASTATIN 20 MG TABLET: 20 | 90 days supply | Qty: 90 | Fill #0

## 2017-03-12 DIAGNOSIS — E119 Type 2 diabetes mellitus without complications: Secondary | ICD-10-CM | POA: Diagnosis not present

## 2017-03-12 DIAGNOSIS — Z125 Encounter for screening for malignant neoplasm of prostate: Secondary | ICD-10-CM | POA: Diagnosis not present

## 2017-03-12 DIAGNOSIS — R5383 Other fatigue: Secondary | ICD-10-CM | POA: Diagnosis not present

## 2017-03-12 DIAGNOSIS — Z79899 Other long term (current) drug therapy: Secondary | ICD-10-CM | POA: Diagnosis not present

## 2017-03-12 DIAGNOSIS — E7849 Other hyperlipidemia: Secondary | ICD-10-CM | POA: Diagnosis not present

## 2017-03-14 LAB — BASIC METABOLIC PANEL
BUN/Creatinine Ratio: 14 (ref 9–20)
BUN: 15 mg/dL (ref 6–24)
CALCIUM: 9 mg/dL (ref 8.7–10.2)
CHLORIDE: 102 mmol/L (ref 96–106)
CO2: 23 mmol/L (ref 20–29)
Creatinine, Ser: 1.04 mg/dL (ref 0.76–1.27)
GFR calc Af Amer: 91 mL/min/{1.73_m2} (ref >59)
GFR, EST NON AFRICAN AMERICAN: 79 mL/min/{1.73_m2} (ref >59)
GLUCOSE: 390 mg/dL — AB (ref 65–99)
POTASSIUM: 4.3 mmol/L (ref 3.5–5.2)
Sodium: 137 mmol/L (ref 134–144)

## 2017-03-14 LAB — HEMOGLOBIN A1C
ESTIMATED AVERAGE GLUCOSE: 372 mg/dL
HEMOGLOBIN A1C: 14.6 % — AB (ref 4.8–5.6)

## 2017-03-14 LAB — TSH: TSH: 1.96 u[IU]/mL (ref 0.450–4.500)

## 2017-03-14 LAB — MICROALBUMIN / CREATININE URINE RATIO
CREATININE, UR: 56.3 mg/dL
MICROALBUM., U, RANDOM: 10.4 ug/mL
Microalb/Creat Ratio: 18.5 mg/g creat (ref 0.0–30.0)

## 2017-03-14 LAB — LIPID PANEL
CHOL/HDL RATIO: 3.8 ratio (ref 0.0–5.0)
Cholesterol, Total: 106 mg/dL (ref 100–199)
HDL: 28 mg/dL — ABNORMAL LOW (ref >39)
LDL CALC: 40 mg/dL (ref 0–99)
Triglycerides: 189 mg/dL — ABNORMAL HIGH (ref 0–149)
VLDL Cholesterol Cal: 38 mg/dL (ref 5–40)

## 2017-03-14 LAB — HEPATIC FUNCTION PANEL
ALBUMIN: 4 g/dL (ref 3.5–5.5)
ALK PHOS: 86 IU/L (ref 39–117)
ALT: 9 IU/L (ref 0–44)
AST: 17 IU/L (ref 0–40)
Bilirubin Total: 0.5 mg/dL (ref 0.0–1.2)
Bilirubin, Direct: 0.15 mg/dL (ref 0.00–0.40)
TOTAL PROTEIN: 6.7 g/dL (ref 6.0–8.5)

## 2017-03-14 LAB — PSA: Prostate Specific Ag, Serum: 1.5 ng/mL (ref 0.0–4.0)

## 2017-03-15 ENCOUNTER — Telehealth: Payer: Self-pay | Admitting: Family Medicine

## 2017-03-15 ENCOUNTER — Emergency Department (HOSPITAL_COMMUNITY)
Admission: EM | Admit: 2017-03-15 | Discharge: 2017-03-15 | Disposition: A | Discharge status: Home / Self Care | Payer: 59 | Attending: Emergency Medicine | Admitting: Emergency Medicine

## 2017-03-15 ENCOUNTER — Encounter (HOSPITAL_COMMUNITY): Payer: Self-pay | Admitting: Emergency Medicine

## 2017-03-15 DIAGNOSIS — Z87891 Personal history of nicotine dependence: Secondary | ICD-10-CM | POA: Diagnosis not present

## 2017-03-15 DIAGNOSIS — Z79899 Other long term (current) drug therapy: Secondary | ICD-10-CM | POA: Diagnosis not present

## 2017-03-15 DIAGNOSIS — I10 Essential (primary) hypertension: Secondary | ICD-10-CM | POA: Insufficient documentation

## 2017-03-15 DIAGNOSIS — E860 Dehydration: Secondary | ICD-10-CM | POA: Diagnosis not present

## 2017-03-15 DIAGNOSIS — Z7984 Long term (current) use of oral hypoglycemic drugs: Secondary | ICD-10-CM | POA: Diagnosis not present

## 2017-03-15 DIAGNOSIS — Z7982 Long term (current) use of aspirin: Secondary | ICD-10-CM | POA: Insufficient documentation

## 2017-03-15 DIAGNOSIS — E1165 Type 2 diabetes mellitus with hyperglycemia: Secondary | ICD-10-CM | POA: Diagnosis not present

## 2017-03-15 DIAGNOSIS — R739 Hyperglycemia, unspecified: Secondary | ICD-10-CM

## 2017-03-15 LAB — URINALYSIS, ROUTINE W REFLEX MICROSCOPIC
BILIRUBIN URINE: NEGATIVE
Bacteria, UA: NONE SEEN
Glucose, UA: >=500 mg/dL — AB
HGB URINE DIPSTICK: NEGATIVE
KETONES UR: NEGATIVE mg/dL
LEUKOCYTES UA: NEGATIVE
NITRITE: NEGATIVE
Protein, ur: NEGATIVE mg/dL
RBC / HPF: NONE SEEN RBC/hpf (ref 0–5)
SPECIFIC GRAVITY, URINE: 1.021 (ref 1.005–1.030)
Squamous Epithelial / LPF: NONE SEEN
pH: 5 (ref 5.0–8.0)

## 2017-03-15 LAB — BASIC METABOLIC PANEL
Anion gap: 8 (ref 5–15)
BUN: 18 mg/dL (ref 6–20)
CHLORIDE: 95 mmol/L — AB (ref 101–111)
CO2: 28 mmol/L (ref 22–32)
Calcium: 9.2 mg/dL (ref 8.9–10.3)
Creatinine, Ser: 0.82 mg/dL (ref 0.61–1.24)
GFR calc Af Amer: >60 mL/min (ref >60)
GFR calc non Af Amer: >60 mL/min (ref >60)
GLUCOSE: 403 mg/dL — AB (ref 65–99)
POTASSIUM: 4.5 mmol/L (ref 3.5–5.1)
Sodium: 131 mmol/L — ABNORMAL LOW (ref 135–145)

## 2017-03-15 LAB — CBC
HEMATOCRIT: 39.4 % (ref 39.0–52.0)
HEMOGLOBIN: 14 g/dL (ref 13.0–17.0)
MCH: 32.6 pg (ref 26.0–34.0)
MCHC: 35.5 g/dL (ref 30.0–36.0)
MCV: 91.8 fL (ref 78.0–100.0)
Platelets: 158 10*3/uL (ref 150–400)
RBC: 4.29 MIL/uL (ref 4.22–5.81)
RDW: 12.2 % (ref 11.5–15.5)
WBC: 5.5 10*3/uL (ref 4.0–10.5)

## 2017-03-15 LAB — CBG MONITORING, ED
GLUCOSE-CAPILLARY: 455 mg/dL — AB (ref 65–99)
Glucose-Capillary: 287 mg/dL — ABNORMAL HIGH (ref 65–99)

## 2017-03-15 MED ORDER — INSULIN ASPART 100 UNIT/ML IV SOLN
10.0000 [IU] | Freq: Once | INTRAVENOUS | Status: AC
  Administered 2017-03-15: 10 [IU] via INTRAVENOUS

## 2017-03-15 MED ORDER — BLOOD GLUCOSE MONITOR SYSTEM W/DEVICE KIT: PACK | 5 refills | Status: DC

## 2017-03-15 MED ORDER — SODIUM CHLORIDE 0.9 % IV BOLUS (SEPSIS)
1000.0000 mL | Freq: Once | INTRAVENOUS | Status: AC
  Administered 2017-03-15: 1000 mL via INTRAVENOUS

## 2017-03-15 MED FILL — FREESTYLE LANCETS: 90 days supply | Qty: 100 | Fill #0

## 2017-03-15 MED FILL — FREESTYLE LITE METER: 1 days supply | Qty: 1 | Fill #0

## 2017-03-15 MED FILL — FREESTYLE LITE TEST STRIP: 90 days supply | Qty: 100 | Fill #0

## 2017-03-15 NOTE — Telephone Encounter (Signed)
May have prescription for glucometer with strips and supplies test once daily as recommended/as needed diabetes type 2 not on insulin

## 2017-03-15 NOTE — Addendum Note (Signed)
Addended by: Ofilia Neas R on: 03/15/2017 01:21 PM   Modules accepted: Orders

## 2017-03-15 NOTE — ED Provider Notes (Signed)
[facility] EMERGENCY DEPARTMENT Provider Note   CSN: [id number] Arrival date & time: 03/15/17  1458     History   Chief Complaint Chief Complaint  Patient presents with  . Hyperglycemia    HPI Travis [name] is a 56 y.o. male.  HPI Presents with poorly controlled blood sugar.  His A1c was 14.6.  States that several months ago his metformin dosage was decreased in half.  Patient states he has had frequent urination and has been drinking plenty of fluids.  He also has increased lightheadedness with changing positions.  He has lost roughly 60 pounds over the last 2 years.  Denies any recent fever or chills.  Denies any new pain including chest pain or abdominal pain. Past Medical History:  Diagnosis Date  . A-fib ([place]) 02/2016   found on loop recorder  . 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 Anxiety   . Arthritis   . Blood transfusion without reported diagnosis   . Chronic headaches   . Complication of anesthesia    bleed after last shoulder-had to stay overnight  . Depression   . Diabetes mellitus   . Diabetic peripheral neuropathy ([place]) 03/28/2013  . Diverticulosis   . Dizziness   . Hernia, inguinal   . Hyperlipidemia   . Hypertension   . Impingement syndrome of left shoulder 08/02/2014  . 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 Neuropathy of lower extremity   . Night sweats    every once in a while  . Sleep apnea    no CPAP  . Stroke ([place]) 02/2015  . Syncope and collapse     Patient Active Problem List   Diagnosis Date Noted  . Generalized anxiety disorder 05/15/2016  . Acute bronchiolitis due to respiratory syncytial virus (RSV)   . Acute CVA (cerebrovascular accident) ([place]) 05/04/2016  . Dizziness 05/02/2016  . Anxiety and depression 05/02/2016  . Paroxysmal atrial fibrillation ([place]) 03/02/2016  . PFO (patent foramen ovale) 07/24/2015  . Chronic deep vein thrombosis (DVT) of popliteal vein of left lower extremity ([place]) 07/24/2015  . OSA  (obstructive sleep apnea) 04/18/2015  . 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 artery ([place]) 04/18/2015  . Cerebellar stroke ([place]) 02/28/2015  . Snoring 01/23/2015  . Degeneration of cervical intervertebral disc 01/23/2015  . Essential hypertension 12/14/2014  . Diabetes mellitus type II, controlled ([place]) 12/14/2014  . HLD (hyperlipidemia) 12/14/2014  . Neck pain 12/14/2014  . Cerebral infarction, chronic   . Vertigo 10/14/2014  . 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 Impingement syndrome of left shoulder 08/02/2014  . 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 Abdominal pain 06/13/2014  . Rectal bleeding 03/13/2014  . Diabetes type 2, controlled ([place]) 02/18/2014  . Hyperlipidemia 02/18/2014  . Diabetic peripheral neuropathy ([place]) 03/28/2013  . Irreducible incisional hernia 11/11/2010    Past Surgical History:  Procedure Laterality Date  . CERVICAL DISC SURGERY    . COLON SURGERY  2003   1/3 removed for diverticulitis  . COLONOSCOPY    . INGUINAL HERNIA REPAIR Bilateral   . KNEE ARTHROSCOPY Right   . SHOULDER ARTHROSCOPY Right    1  . UMBILICAL HERNIA REPAIR     with other hernia repair with mesh  . WRIST SURGERY Right    fusion       Home Medications    Prior to Admission medications   Medication Sig Start Date End Date Taking? Authorizing Provider  ALPRAZolam (XANAX) 0.25 MG tablet TAKE 1 TABLET BY MOUTH  TWICE DAILY AS NEEDED 12/16/16  Yes Luking, Elayne Snare, MD  apixaban (ELIQUIS) 5 MG TABS tablet Take 1 tablet (5 mg total) by mouth 2 (two) times daily. 04/22/16  Yes Sherran Needs, NP  aspirin EC 81 MG tablet Take 1 tablet (81 mg total) by mouth daily. 05/12/16  Yes Rosalin Hawking, MD  atorvastatin (LIPITOR) 20 MG tablet TAKE 1 TABLET BY MOUTH DAILY. 03/01/17  Yes Kathyrn Drown, MD  Cholecalciferol (VITAMIN D) 400 UNITS capsule Take 400 Units by mouth daily.     Yes [provider]  folic acid (FOLVITE) 1 MG tablet TAKE 1 TABLET BY  MOUTH DAILY. 10/04/16  Yes Mikey Kirschner, MD  meclizine (ANTIVERT) 25 MG tablet TAKE 1 TABLET BY MOUTH 3 TIMES DAILY AS NEEDED FOR DIZZINESS. 01/18/17  Yes Luking, Elayne Snare, MD  metFORMIN (GLUCOPHAGE) 500 MG tablet TAKE 1 TABLETS BY MOUTH 2 TIMES DAILY WITH A MEAL. 01/18/17  Yes Kathyrn Drown, MD  Multiple Vitamin (MULTIVITAMIN WITH MINERALS) TABS tablet Take 1 tablet daily by mouth.   Yes [provider]  pregabalin (LYRICA) 100 MG capsule Take 1 capsule (100 mg total) by mouth 3 (three) times daily. 01/19/17  Yes Luking, Elayne Snare, MD  traMADol (ULTRAM) 50 MG tablet TAKE 1 TABLET BY MOUTH THREE TIMES DAILY AS NEEDED FOR MODREATE PAIN 03/03/17  Yes Kathyrn Drown, MD  valACYclovir (VALTREX) 1000 MG tablet TAKE 1 TABLET BY MOUTH ONCE DAILY 02/17/17  Yes Luking, Scott A, MD  Blood Glucose Monitoring Suppl (BLOOD GLUCOSE MONITOR SYSTEM) w/Device KIT Test glucose 1 time daily. 03/15/17   Kathyrn Drown, MD    Family History Family History  Problem Relation Age of Onset  . Stroke Father   . Hyperlipidemia Father   . Heart attack Sister 7  . Dementia Mother   . Diabetes Maternal Grandfather   . ALS Brother 74  . Colon cancer Neg Hx   . Esophageal cancer Neg Hx   . Stomach cancer Neg Hx   . Rectal cancer Neg Hx     Social History Social History   Tobacco Use  . Smoking status: Former Smoker    Packs/day: 1.00    Years: 8.00    Pack years: 8.00    Types: Cigarettes    Start date: 06/07/1970    Last attempt to quit: 05/03/1978    Years since quitting: 38.8  . Smokeless tobacco: Former Systems developer    Types: [place] date: 05/03/1978  Substance Use Topics  . Alcohol use: No    Alcohol/week: 0.6 oz    Types: 1 Cans of beer per week    Comment: h/o heavy use in the past  . Drug use: No     Allergies   Lisinopril   Review of Systems Review of Systems  Constitutional: Positive for fatigue and unexpected weight change. Negative for chills and fever.  HENT: Negative for  congestion, trouble swallowing and voice change.   Eyes: Negative for visual disturbance.  Respiratory: Negative for cough and shortness of breath.   Cardiovascular: Negative for chest pain and leg swelling.  Gastrointestinal: Negative for abdominal pain, diarrhea and nausea.  Endocrine: Positive for polydipsia and polyuria.  Genitourinary: Positive for frequency. Negative for dysuria, flank pain and hematuria.  Musculoskeletal: Negative for arthralgias, back pain, myalgias and neck pain.  Skin: Negative for rash and wound.  Neurological: Positive for light-headedness. Negative for dizziness, weakness, numbness and headaches.  All other systems  reviewed and are negative.    Physical Exam Updated Vital Signs BP 117/89 (BP Location: Left Arm)   Pulse 78   Temp 98.2 F (36.8 C) (Oral)   Resp 16   Ht _0  (1.854 m)   Wt 76.2 kg (168 lb)   SpO2 97%   BMI 22.16 kg/m   Physical Exam  Constitutional: He is oriented to person, place, and time. He appears well-developed and well-nourished. No distress.  HENT:  Head: Normocephalic and atraumatic.  Mouth/Throat: Oropharynx is clear and moist. No oropharyngeal exudate.  Eyes: EOM are normal. Pupils are equal, round, and reactive to light.  Neck: Normal range of motion. Neck supple.  Cardiovascular: Normal rate and regular rhythm. Exam reveals no gallop and no friction rub.  No murmur heard. Pulmonary/Chest: Effort normal and breath sounds normal. No stridor. No respiratory distress. He has no wheezes. He has no rales. He exhibits no tenderness.  Abdominal: Soft. Bowel sounds are normal. There is no tenderness. There is no rebound and no guarding.  Musculoskeletal: Normal range of motion. He exhibits no edema or tenderness.  No lower extremity swelling, asymmetry or tenderness.  Neurological: He is alert and oriented to person, place, and time.  Moves all extremities without focal deficit.  Sensation fully intact.  Skin: Skin is warm and  dry. No rash noted. No erythema.  Psychiatric: He has a normal mood and affect. His behavior is normal.  Nursing note and vitals reviewed.    ED Treatments / Results  Labs (all labs ordered are listed, but only abnormal results are displayed) Labs Reviewed  BASIC METABOLIC PANEL - Abnormal; Notable for the following components:      Result Value   Sodium 131 (*)    Chloride 95 (*)    Glucose, Bld 403 (*)    All other components within normal limits  URINALYSIS, ROUTINE W REFLEX MICROSCOPIC - Abnormal; Notable for the following components:   Glucose, UA >=500 (*)    All other components within normal limits  CBG MONITORING, ED - Abnormal; Notable for the following components:   Glucose-Capillary 455 (*)    All other components within normal limits  CBG MONITORING, ED - Abnormal; Notable for the following components:   Glucose-Capillary 287 (*)    All other components within normal limits  CBC    EKG  EKG Interpretation None       Radiology No results found.  Procedures Procedures (including critical care time)  Medications Ordered in ED Medications  sodium chloride 0.9 % bolus 1,000 mL (0 mLs Intravenous Stopped 03/15/17 1919)  insulin aspart (novoLOG) injection 10 Units (10 Units Intravenous Given 03/15/17 1818)     Initial Impression / Assessment and Plan / ED Course  I have reviewed the triage vital signs and the nursing notes.  Pertinent labs & imaging results that were available during my care of the patient were reviewed by me and considered in my medical decision making (see chart for details).    Patient states he is feeling better after IV fluids.  Blood sugar is improved.  Will discharge home to follow-up closely with his primary physician.   Final Clinical Impressions(s) / ED Diagnoses   Final diagnoses:  Hyperglycemia  Dehydration    ED Discharge Orders    None       Julianne Rice, MD 03/15/17 2334

## 2017-03-15 NOTE — Telephone Encounter (Signed)
Please call patient- due to his high glucose and ER visit I highly recommend follow up this week. I can see him Weds or Thursday and discuss his diabetes with him. Schedule ER follow up as stated above

## 2017-03-15 NOTE — Telephone Encounter (Signed)
Prescription for glucometer ordered in epic and sent to [facility].

## 2017-03-15 NOTE — Telephone Encounter (Signed)
Patient's wife called in stating that the patient does not have a glucometer to check glucose. May we send in a prescription.

## 2017-03-15 NOTE — ED Triage Notes (Signed)
Pt's daughter states he recently got his blood work back and his A1C 14.6, has not been checking his blood sugar. CBG was reading HIGH earlier today, has been having increased thirst and urination.

## 2017-03-16 ENCOUNTER — Ambulatory Visit (INDEPENDENT_AMBULATORY_CARE_PROVIDER_SITE_OTHER): Payer: 59 | Admitting: Family Medicine

## 2017-03-16 ENCOUNTER — Encounter: Payer: Self-pay | Admitting: Family Medicine

## 2017-03-16 VITALS — BP 120/80 | Ht 73.0 in | Wt 170.0 lb

## 2017-03-16 DIAGNOSIS — E1165 Type 2 diabetes mellitus with hyperglycemia: Secondary | ICD-10-CM

## 2017-03-16 DIAGNOSIS — E119 Type 2 diabetes mellitus without complications: Secondary | ICD-10-CM

## 2017-03-16 LAB — POCT GLUCOSE (DEVICE FOR HOME USE): POC GLUCOSE: 263 mg/dL — AB (ref 70–99)

## 2017-03-16 MED ORDER — INSULIN DETEMIR 100 UNIT/ML FLEXPEN: PEN_INJECTOR | SUBCUTANEOUS | 0 refills | Status: DC

## 2017-03-16 NOTE — Progress Notes (Signed)
   Subjective:    Patient ID: Travis [name], male    DOB: January 13, 1961, 56 y.o.   MRN: [id number]  HPI  Patient is here  Today to follow up on his hospitalization from yesterday having some high blood sugars. Leading up to this he was feeling tired and dizzy headed and irritable. Last A1c was 14.6 03/12/2017. He is taking Metformin 500 mg one bid. He seems uninterested in talking about it ,but wife is concerned. Review of Systems Patient relates excessive thirst urination feels tired he did go to the ER yesterday received some fluids along with insulin sugars were doing better for a while but this morning were over 300 and currently to 87    Objective:   Physical Exam Neck no masses lungs are clear no crackles heart regular no murmurs not tachycardic pulses normal extremities no edema skin warm dry  Patient wonders if he can be on a medication like Trulicity Patient is hesitant to be on insulin but we did talk about pancreatic fatigue and lack of adequate insulin production.  At that using the Metformin to help with insulin resistance.     Assessment & Plan:  Diabetes Worsening condition With the level of his fasting glucoses a midday glucoses I believe the patient is now not producing enough insulin to maintain his homeostasis. He may continue with the Metformin at the higher dose 2 pills twice daily I believe the patient needs long-acting insulin We will also do endocrinology consultation and diabetic educator education Start off with Levemir 6 units each evening the wife will call us with a glucose reading tomorrow morning we will adjust accordingly Dietary measures were discussed in detail reinforced patient noncompliant with diet  Patient has a history of strokes and patient was told very frankly that if he does not get this under better control it dramatically increases the risk of more strokes which could be life altering or cause death  He will bump up Metformin to take 2 in the  morning 2 in the evening

## 2017-03-16 NOTE — Telephone Encounter (Signed)
Scheduled appointment for this afternoon. 

## 2017-03-16 NOTE — Patient Instructions (Signed)
Diabetes Mellitus and Food It is important for you to manage your blood sugar (glucose) level. Your blood glucose level can be greatly affected by what you eat. Eating healthier foods in the appropriate amounts throughout the day at about the same time each day will help you control your blood glucose level. It can also help slow or prevent worsening of your diabetes mellitus. Healthy eating may even help you improve the level of your blood pressure and reach or maintain a healthy weight. General recommendations for healthful eating and cooking habits include:  Eating meals and snacks regularly. Avoid going long periods of time without eating to lose weight.  Eating a diet that consists mainly of plant-based foods, such as fruits, vegetables, nuts, legumes, and whole grains.  Using low-heat cooking methods, such as baking, instead of high-heat cooking methods, such as deep frying.  Work with your dietitian to make sure you understand how to use the Nutrition Facts information on food labels. How can food affect me? Carbohydrates Carbohydrates affect your blood glucose level more than any other type of food. Your dietitian will help you determine how many carbohydrates to eat at each meal and teach you how to count carbohydrates. Counting carbohydrates is important to keep your blood glucose at a healthy level, especially if you are using insulin or taking certain medicines for diabetes mellitus. Alcohol Alcohol can cause sudden decreases in blood glucose (hypoglycemia), especially if you use insulin or take certain medicines for diabetes mellitus. Hypoglycemia can be a life-threatening condition. Symptoms of hypoglycemia (sleepiness, dizziness, and disorientation) are similar to symptoms of having too much alcohol. If your health care provider has given you approval to drink alcohol, do so in moderation and use the following guidelines:  Women should not have more than one drink per day, and men  should not have more than two drinks per day. One drink is equal to: ? 12 oz of beer. ? 5 oz of wine. ? 1 oz of hard liquor.  Do not drink on an empty stomach.  Keep yourself hydrated. Have water, diet soda, or unsweetened iced tea.  Regular soda, juice, and other mixers might contain a lot of carbohydrates and should be counted.  What foods are not recommended? As you make food choices, it is important to remember that all foods are not the same. Some foods have fewer nutrients per serving than other foods, even though they might have the same number of calories or carbohydrates. It is difficult to get your body what it needs when you eat foods with fewer nutrients. Examples of foods that you should avoid that are high in calories and carbohydrates but low in nutrients include:  Trans fats (most processed foods list trans fats on the Nutrition Facts label).  Regular soda.  Juice.  Candy.  Sweets, such as cake, pie, doughnuts, and cookies.  Fried foods.  What foods can I eat? Eat nutrient-rich foods, which will nourish your body and keep you healthy. The food you should eat also will depend on several factors, including:  The calories you need.  The medicines you take.  Your weight.  Your blood glucose level.  Your blood pressure level.  Your cholesterol level.  You should eat a variety of foods, including:  Protein. ? Lean cuts of meat. ? Proteins low in saturated fats, such as fish, egg whites, and beans. Avoid processed meats.  Fruits and vegetables. ? Fruits and vegetables that may help control blood glucose levels, such as apples,   mangoes, and yams.  Dairy products. ? Choose fat-free or low-fat dairy products, such as milk, yogurt, and cheese.  Grains, bread, pasta, and rice. ? Choose whole grain products, such as multigrain bread, whole oats, and brown rice. These foods may help control blood pressure.  Fats. ? Foods containing healthful fats, such as  nuts, avocado, olive oil, canola oil, and fish.  Does everyone with diabetes mellitus have the same meal plan? Because every person with diabetes mellitus is different, there is not one meal plan that works for everyone. It is very important that you meet with a dietitian who will help you create a meal plan that is just right for you. This information is not intended to replace advice given to you by your health care provider. Make sure you discuss any questions you have with your health care provider. Document Released: 01/14/2005 Document Revised: 09/25/2015 Document Reviewed: 03/16/2013 Elsevier Interactive Patient Education  2017 Elsevier Inc.  

## 2017-03-17 ENCOUNTER — Encounter: Payer: Self-pay | Admitting: Family Medicine

## 2017-03-17 ENCOUNTER — Telehealth: Payer: Self-pay | Admitting: Family Medicine

## 2017-03-17 MED ORDER — METFORMIN HCL 500 MG PO TABS: ORAL_TABLET | ORAL | 5 refills | Status: DC

## 2017-03-17 MED FILL — UNIFINE PENTIPS 8MM 31G: 31G X 8 MM | 90 days supply | Qty: 100 | Fill #0

## 2017-03-17 MED FILL — metFORMIN HCL 500 MG TABS: 500 | 30 days supply | Qty: 120 | Fill #0

## 2017-03-17 NOTE — Telephone Encounter (Signed)
Spoke with patient's wife and informed her per Dr.Scott Luking-  go ahead and start using 10 units each evening.  You can send [place] some readings through my chart in several days or you can call on Monday morning to let us know what the readings are. Patient's wife verbalized understanding and stated that she will call us on Monday with readings.

## 2017-03-17 NOTE — Addendum Note (Signed)
Addended by: Launa Grill on: 03/17/2017 09:38 AM   Modules accepted: Orders

## 2017-03-17 NOTE — Progress Notes (Signed)
Metformin with new directions to take 2 tablets BID was sent into pharmacy for patient.

## 2017-03-17 NOTE — Telephone Encounter (Signed)
Please connect with Travis [name].  Let her know to go ahead and start using 10 units each evening.  She can send me some readings through my chart in several days or she can call on Monday morning to let us know what the readings are

## 2017-03-17 NOTE — Telephone Encounter (Signed)
BS this morning was 284.  He did the 6 units of insulin last night.

## 2017-03-21 ENCOUNTER — Ambulatory Visit (INDEPENDENT_AMBULATORY_CARE_PROVIDER_SITE_OTHER): Payer: 59 | Admitting: *Deleted

## 2017-03-21 ENCOUNTER — Telehealth: Payer: Self-pay | Admitting: Family Medicine

## 2017-03-21 DIAGNOSIS — I63443 Cerebral infarction due to embolism of bilateral cerebellar arteries: Secondary | ICD-10-CM | POA: Diagnosis not present

## 2017-03-21 MED FILL — traMADol HCL 50 MG TABS: 50 | 11 days supply | Qty: 35 | Fill #1

## 2017-03-21 MED FILL — ALPRAZolam 0.25 MG TABS: 0.25 | 30 days supply | Qty: 60 | Fill #3

## 2017-03-21 MED FILL — LEVEMIR FLEXTOUCH 100 UNITS: 100 | 30 days supply | Qty: 12 | Fill #0

## 2017-03-21 MED FILL — valACYclovir HCL 1 GM TABS: 1 | 30 days supply | Qty: 90 | Fill #1

## 2017-03-21 NOTE — Telephone Encounter (Signed)
I recommend that we increase the long-acting insulin to 14 units, please check sugars twice a day and if morning sugar is above 160 on Wednesday they can increase the long-acting insulin to 16 units.  Plus also please call us on Friday morning to give update regarding glucose readings

## 2017-03-21 NOTE — Telephone Encounter (Signed)
Pt's glucose readings for this weekend were:   248  [phone] 348 [phone]  [phone]  Went to ER

## 2017-03-21 NOTE — Telephone Encounter (Signed)
Left message to return call 

## 2017-03-22 MED ORDER — INSULIN DETEMIR 100 UNIT/ML FLEXPEN: PEN_INJECTOR | SUBCUTANEOUS | 0 refills | Status: DC

## 2017-03-22 NOTE — Progress Notes (Signed)
[facility] Summary Report / Loop Recorder 

## 2017-03-22 NOTE — Telephone Encounter (Signed)
Wife is aware

## 2017-03-30 ENCOUNTER — Ambulatory Visit: Payer: 59 | Admitting: Neurology

## 2017-03-31 ENCOUNTER — Other Ambulatory Visit (HOSPITAL_COMMUNITY): Payer: Self-pay | Admitting: Nurse Practitioner

## 2017-03-31 ENCOUNTER — Telehealth: Payer: Self-pay | Admitting: Cardiology

## 2017-03-31 ENCOUNTER — Other Ambulatory Visit: Payer: Self-pay | Admitting: Family Medicine

## 2017-03-31 NOTE — Telephone Encounter (Signed)
LMOVM requesting that pt send manual transmission b/c home monitor has not updated in at least 14 days.    

## 2017-04-01 MED FILL — FOLIC ACID 1 MG TABLET: 1 | 90 days supply | Qty: 90 | Fill #0

## 2017-04-01 MED FILL — ELIQUIS 5 MG TABLET: 5 | 30 days supply | Qty: 60 | Fill #0

## 2017-04-06 ENCOUNTER — Encounter: Payer: Self-pay | Admitting: Cardiology

## 2017-04-07 LAB — CUP PACEART REMOTE DEVICE CHECK
MDC IDC PG IMPLANT DT: 20161229
MDC IDC SESS DTM: 20181119211043

## 2017-04-11 ENCOUNTER — Ambulatory Visit: Payer: 59 | Admitting: Neurology

## 2017-04-12 ENCOUNTER — Encounter: Payer: 59 | Attending: Family Medicine | Admitting: Nutrition

## 2017-04-12 VITALS — Ht 73.0 in | Wt 168.0 lb

## 2017-04-12 DIAGNOSIS — IMO0002 Reserved for concepts with insufficient information to code with codable children: Secondary | ICD-10-CM

## 2017-04-12 DIAGNOSIS — E118 Type 2 diabetes mellitus with unspecified complications: Secondary | ICD-10-CM

## 2017-04-12 DIAGNOSIS — E1165 Type 2 diabetes mellitus with hyperglycemia: Secondary | ICD-10-CM

## 2017-04-12 NOTE — Progress Notes (Signed)
  Medical Nutrition Therapy:  Appt start time: 1500 end time:  1630.   Assessment:  Primary concerns today: Type 2 DM. Lives with his wife.  He and his wife shop and he does all the cooking. Has lost 8 lbs. Has not been urinating at much recently since going on insulin. Taking 16 units of Levemir at night. Had frequent urination, thirsty and blurred vision but is doing much better recently since BS are better and now taking insulin. MEtformin 1000 mg BID.   Sees dentist and eye dr. Had stoke over 6 months ago.  Recent A1C 14.6%. Had been 7% or below in the last few years. BS meter assessed. Avg BS is now 176 mg/dl. Doing much better. Feels better.  Has a wobbly walk due to knee issues and previous stroke. Denies falls. Working on addressing knee issues with PCP. He is eating more steamed vegetables and grilled meats now.    Preferred Learning Style:   Auditory  Hands on  Learning Readiness:   Ready  Change in progress   MEDICATIONS: See list   DIETARY INTAKE:  24-hr recall:  B ( AM): 2 slices ww toast or cheese and [place], 2 cups coffee-splenda and 2% milk  And jimmy dean sausage and eggs. Cheese, mustard Snk ( AM): 1 slice bread and pb , diet soda L ( PM):  [place] sandwich with cheese, lite mayo, diet soda, chex mix Snk ( PM): 1/2 pb sandwich, or snack bag-cheese balls or lays baked chips D ( PM): Heathy Choice Chicken, noodles meal, sanwich [place] sandwich, water Snk ( PM): water, Beverages: diet sodas,  Usual physical activity: yard work.  Estimated energy needs: 2000  calories 225 g carbohydrates 150 g protein 56 g fat  Progress Towards Goal(s):  In progress.   Nutritional Diagnosis:  NB-1.1 Food and nutrition-related knowledge deficit As related to Diabetes Type 2.  As evidenced by A1C >14%.    Intervention:  Nutrition and Diabetes education provided on My Plate, CHO counting, meal planning, portion sizes, timing of meals, avoiding snacks between meals unless  having a low blood sugar, target ranges for A1C and blood sugars, signs/symptoms and treatment of hyper/hypoglycemia, monitoring blood sugars, taking medications as prescribed, benefits of exercising 30 minutes per day and prevention of complications of DM.  Goals 1. Follow My Plate 2. Eat 45-60 grams of carbs per meal- 3-4 carb choices per meal. If low blood sugars, increase to 75 grams of carbs and/or call Dr. Dorris Fetch to adjust insulin if happens 2-3 times in a row. 3. Cut out diet sodas 4. Drink water 5. Increase fiber and fresh fruits and vegetables 6. Test blood sugars twice a day; am and before bed. 7. Take insulin at night around 9 pm. Get A1C down to 7% or below.   Teaching Method Utilized:  Visual Auditory Hands on  Handouts given during visit include:  The Plate Method   Meal Plan Card  Diabetes Instructions.   Barriers to learning/adherence to lifestyle change: none  Demonstrated degree of understanding via:  Teach Back   Monitoring/Evaluation:  Dietary intake, exercise, meal planning,SBG, and body weight in 1 month(s).

## 2017-04-13 ENCOUNTER — Encounter: Payer: Self-pay | Admitting: Family Medicine

## 2017-04-13 ENCOUNTER — Encounter: Payer: Self-pay | Admitting: Nutrition

## 2017-04-13 ENCOUNTER — Ambulatory Visit (INDEPENDENT_AMBULATORY_CARE_PROVIDER_SITE_OTHER): Payer: 59 | Admitting: Family Medicine

## 2017-04-13 VITALS — BP 118/78 | Ht 73.0 in | Wt 170.0 lb

## 2017-04-13 DIAGNOSIS — Z79899 Other long term (current) drug therapy: Secondary | ICD-10-CM

## 2017-04-13 DIAGNOSIS — E119 Type 2 diabetes mellitus without complications: Secondary | ICD-10-CM | POA: Diagnosis not present

## 2017-04-13 DIAGNOSIS — I480 Paroxysmal atrial fibrillation: Secondary | ICD-10-CM | POA: Diagnosis not present

## 2017-04-13 NOTE — Progress Notes (Signed)
   Subjective:    Patient ID: Travis [name], male    DOB: 12/11/1960, 56 y.o.   MRN: [id number]  HPI Patient is here today to follow up on his DM.He was started on Metformin 500 mg 2 pill BID.He is on Levemir 16 units Q evening arround 7-8 pm Patient states he is doing better with taking his insulin taking his medication and getting things under better control denies any high fever chills sweats denies nausea vomiting diarrhea  Review of Systems  Constitutional: Negative for activity change, appetite change and fatigue.  Endocrine: Negative for polydipsia and polyphagia.  Neurological: Negative for weakness.  Psychiatric/Behavioral: Negative for confusion.       Objective:   Physical Exam  Constitutional: He appears well-nourished. No distress.  Cardiovascular: Normal rate and normal heart sounds.  No murmur heard. Pulmonary/Chest: Effort normal and breath sounds normal. No respiratory distress.  Musculoskeletal: He exhibits no edema.  Lymphadenopathy:    He has no cervical adenopathy.  Neurological: He is alert.  Psychiatric: His behavior is normal.  Vitals reviewed.  Patient does have atrial fibrillation he is on anticoagulant he understands the importance of taking it no bleeding complications       Assessment & Plan:  Diabetes improved control he would do lab work in mid February with a follow-up office visit he will report to [place] if any low sugar spells he will record his readings and forward them back to [place] no stroke symptoms currently

## 2017-04-13 NOTE — Patient Instructions (Signed)
Goals 1. Follow My Plate 2. Eat 45-60 grams of carbs per meal- 3-4 carb choices per meal. If low blood sugars, increase to 75 grams of carbs and/or call Dr. Dorris Fetch to adjust insulin if happens 2-3 times in a row. 3. Cut out diet sodas 4. Drink water 5. Increase fiber and fresh fruits and vegetables 6. Test blood sugars twice a day; am and before bed. 7. Take insulin at night around 9 pm. Get A1C down to 7% or below.

## 2017-04-13 NOTE — Patient Instructions (Signed)
Blood work in Feb.2019 and follow up office visit

## 2017-04-14 ENCOUNTER — Encounter: Payer: Self-pay | Admitting: Neurology

## 2017-04-14 ENCOUNTER — Ambulatory Visit: Payer: 59 | Admitting: Neurology

## 2017-04-14 VITALS — BP 123/70 | HR 75 | Wt 170.8 lb

## 2017-04-14 DIAGNOSIS — I10 Essential (primary) hypertension: Secondary | ICD-10-CM | POA: Diagnosis not present

## 2017-04-14 DIAGNOSIS — G8929 Other chronic pain: Secondary | ICD-10-CM | POA: Insufficient documentation

## 2017-04-14 DIAGNOSIS — I6340 Cerebral infarction due to embolism of unspecified cerebral artery: Secondary | ICD-10-CM

## 2017-04-14 DIAGNOSIS — M25561 Pain in right knee: Secondary | ICD-10-CM | POA: Diagnosis not present

## 2017-04-14 DIAGNOSIS — G4733 Obstructive sleep apnea (adult) (pediatric): Secondary | ICD-10-CM | POA: Diagnosis not present

## 2017-04-14 DIAGNOSIS — E1159 Type 2 diabetes mellitus with other circulatory complications: Secondary | ICD-10-CM | POA: Diagnosis not present

## 2017-04-14 DIAGNOSIS — I480 Paroxysmal atrial fibrillation: Secondary | ICD-10-CM

## 2017-04-14 MED FILL — LYRICA 100 MG CAPSULE: 100 | 90 days supply | Qty: 270 | Fill #1
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
## 2017-04-14 NOTE — Patient Instructions (Addendum)
-   continue eliquis, ASA and lipitor for stroke prevention  - follow up with OSA treatment. - continue home exercise - Follow up with your primary care physician for stroke risk factor modification. Recommend maintain blood pressure goal <130/80, diabetes with hemoglobin A1c goal below 6.5% and lipids with LDL cholesterol goal below 70 mg/dL.  - check BP and glucose at home and record - follow up in one year.

## 2017-04-15 ENCOUNTER — Encounter: Payer: Self-pay | Admitting: Cardiology

## 2017-04-20 ENCOUNTER — Encounter: Payer: 59 | Admitting: *Deleted

## 2017-04-21 ENCOUNTER — Telehealth: Payer: Self-pay | Admitting: Family Medicine

## 2017-04-21 ENCOUNTER — Encounter: Payer: Self-pay | Admitting: Family Medicine

## 2017-04-21 NOTE — Telephone Encounter (Signed)
Left message to return call 

## 2017-04-21 NOTE — Telephone Encounter (Signed)
I received a phone call from the patient's dentist office Dr. Aquilla Solian.  1 of their doctors will be doing extraction.  They requested permission for the patient to come off of his blood thinner.  I dictated a letter detailing out how he should stop his aspirin 5 days before hand.  Then he would stop his Eliquis 2 days before the extraction then he could restart the Eliquis and the aspirin the following day after the extraction as long as he is not having bleeding.  Theoretically there is a very very small risk that he could have a stroke while off of the medication.  He should be aware of this.  But he cannot get the extraction without being off of the medicine.  Please inform the patient of the previous.  This message will also be forwarded to Danae Chen so she can print the letter and fax it to Dr. Aquilla Solian office at [phone]

## 2017-04-22 MED FILL — MECLIZINE 25 MG TABLET: 25 | 10 days supply | Qty: 30 | Fill #3

## 2017-04-22 MED FILL — ALPRAZolam 0.25 MG TABS: 0.25 | 30 days supply | Qty: 60 | Fill #4

## 2017-04-22 NOTE — Telephone Encounter (Signed)
Advised Per [name]:  [name] received a phone call from the patient's dentist office Dr. Aquilla Solian.  1 of their doctors will be doing extraction.  They requested permission for the patient to come off of his blood thinner.  [name] dictated a letter detailing out how he should stop his aspirin 5 days before hand.  Then he would stop his Eliquis 2 days before the extraction then he could restart the Eliquis and the aspirin the following day after the extraction as long as he is not having bleeding.  Theoretically there is a very very small risk that he could have a stroke while off of the medication.  He should be aware of this.  But he cannot get the extraction without being off of the medicine.  Please inform the patient of the previous.  This message will also be forwarded to Danae Chen so she can print the letter and fax it to Dr. Aquilla Solian office at [phone]. Verbalized understanding and coming by to pick up a copy of the dictated letter.

## 2017-04-27 ENCOUNTER — Ambulatory Visit: Payer: 59 | Admitting: "Endocrinology

## 2017-05-02 MED FILL — ELIQUIS 5 MG TABLET: 5 | 30 days supply | Qty: 60 | Fill #1

## 2017-05-05 ENCOUNTER — Encounter: Payer: Self-pay | Admitting: "Endocrinology

## 2017-05-05 ENCOUNTER — Ambulatory Visit: Payer: 59 | Admitting: "Endocrinology

## 2017-05-05 VITALS — BP 121/81 | HR 86 | Ht 73.0 in | Wt 176.0 lb

## 2017-05-05 DIAGNOSIS — E1159 Type 2 diabetes mellitus with other circulatory complications: Secondary | ICD-10-CM

## 2017-05-05 DIAGNOSIS — E782 Mixed hyperlipidemia: Secondary | ICD-10-CM

## 2017-05-05 NOTE — Progress Notes (Signed)
Consult Note       05/05/2017, 1:17 PM   Subjective:    Patient ID: Travis [name], male    DOB: [birth year].  Travis [name] is being seen in consultation for management of currently uncontrolled symptomatic diabetes requested by  Kathyrn Drown, MD.   Past Medical History:  Diagnosis Date  . A-fib ([place]) 02/2016   found on loop recorder  . 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 Anxiety   . Arthritis   . Blood transfusion without reported diagnosis   . Chronic headaches   . Complication of anesthesia    bleed after last shoulder-had to stay overnight  . Depression   . Diabetes mellitus   . Diabetic peripheral neuropathy ([place]) 03/28/2013  . Diverticulosis   . Dizziness   . Hernia, inguinal   . Hyperlipidemia   . Hypertension   . Impingement syndrome of left shoulder 08/02/2014  . 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 Neuropathy of lower extremity   . Night sweats    every once in a while  . Sleep apnea    no CPAP  . Stroke ([place]) 02/2015  . Syncope and collapse    Past Surgical History:  Procedure Laterality Date  . CERVICAL DISC SURGERY    . COLON SURGERY  2003   1/3 removed for diverticulitis  . COLONOSCOPY    . EP IMPLANTABLE DEVICE N/A 05/01/2015   Procedure: Loop Recorder Insertion;  Surgeon: [name], MD;  Location: [facility];  Service: Cardiovascular;  Laterality: N/A;  . INGUINAL HERNIA REPAIR Bilateral   . KNEE ARTHROSCOPY Right   . SHOULDER ARTHROSCOPY Right    1  . SHOULDER ARTHROSCOPY Left 08/02/2014   Procedure: LEFT SHOULDER SCOPE DEBRIDEMENT/ACROMIOPLASTY;  Surgeon: [name], MD;  Location: [facility];  Service: Orthopedics;  Laterality: Left;  ANESTHESIA: GENERAL, PRE/POST OP SCALENE  . TEE WITHOUT CARDIOVERSION N/A 03/03/2015   Procedure: TRANSESOPHAGEAL ECHOCARDIOGRAM (TEE);  Surgeon: [name], MD;  [facility];  Service: Cardiology;  Laterality: N/A;  . UMBILICAL HERNIA REPAIR     with other hernia repair with mesh  . WRIST SURGERY Right    fusion   Social History   Socioeconomic History  . Marital status: Married    Spouse name: Not on file  . Number of children: 2  . Years of education: College  . Highest education level: Not on file  Social Needs  . Financial resource strain: Not on file  . Food insecurity - worry: Not on file  . Food insecurity - inability: Not on file  . Transportation needs - medical: Not on file  . Transportation needs - non-medical: Not on file  Occupational History  . Occupation: disabled  Tobacco Use  . Smoking status: Former Smoker    Packs/day: 1.00    Years: 8.00    Pack years: 8.00    Types: Cigarettes    Start date: 06/07/1970    Last attempt to quit: 05/03/1978    Years since quitting: 39.0  . Smokeless tobacco: Former Systems developer    Types: Loss adjuster, chartered  Quit date: 05/03/1978  Substance and Sexual Activity  . Alcohol use: No    Alcohol/week: 0.6 oz    Types: 1 Cans of beer per week    Comment: h/o heavy use in the past  . Drug use: No  . Sexual activity: Not on file  Other Topics Concern  . Not on file  Social History Narrative   Drinks some coffee, Drink diet sodas and tea   Outpatient Encounter Medications as of 05/05/2017  Medication Sig  . Insulin Detemir (LEVEMIR) 100 UNIT/ML Pen Inject 15 Units into the skin daily at 10 pm.  . ALPRAZolam (XANAX) 0.25 MG tablet TAKE 1 TABLET BY MOUTH TWICE DAILY AS NEEDED  . apixaban (ELIQUIS) 5 MG TABS tablet Take 1 tablet (5 mg total) by mouth 2 (two) times daily.  Marland Kitchen aspirin EC 81 MG tablet Take 1 tablet (81 mg total) by mouth daily.  Marland Kitchen atorvastatin (LIPITOR) 20 MG tablet TAKE 1 TABLET BY MOUTH DAILY.  Marland Kitchen Blood Glucose Monitoring Suppl (BLOOD GLUCOSE MONITOR SYSTEM) w/Device KIT Test glucose 1 time daily.  . Cholecalciferol (VITAMIN D) 400 UNITS capsule Take 400 Units by mouth daily.    . folic acid (FOLVITE) 1  MG tablet TAKE 1 TABLET BY MOUTH DAILY.  . meclizine (ANTIVERT) 25 MG tablet TAKE 1 TABLET BY MOUTH 3 TIMES DAILY AS NEEDED FOR DIZZINESS.  . metFORMIN (GLUCOPHAGE) 500 MG tablet Take 2 tablets by mouth twice daily  . Multiple Vitamin (MULTIVITAMIN WITH MINERALS) TABS tablet Take 1 tablet daily by mouth.  . pregabalin (LYRICA) 100 MG capsule Take 1 capsule (100 mg total) by mouth 3 (three) times daily.  . traMADol (ULTRAM) 50 MG tablet TAKE 1 TABLET BY MOUTH THREE TIMES DAILY AS NEEDED FOR MODREATE PAIN  . valACYclovir (VALTREX) 1000 MG tablet TAKE 1 TABLET BY MOUTH ONCE DAILY  . [DISCONTINUED] Insulin Detemir (LEVEMIR FLEXPEN) 100 UNIT/ML Pen 14 units at bedtime. May titrate up to 40 units as directed (Patient taking differently: 18 Units at bedtime. 14 units at bedtime. May titrate up to 40 units as directed)   No facility-administered encounter medications on file as of 05/05/2017.     ALLERGIES: Allergies  Allergen Reactions  . Lisinopril Cough    Patient/spouse is not aware/familiar with why this is listed as an allergy    VACCINATION STATUS: Immunization History  Administered Date(s) Administered  . Influenza Split 03/28/2013  . Influenza,inj,Quad PF,6+ Mos 02/18/2014, 01/23/2015, 02/06/2016, 02/23/2017  . Influenza-Unspecified 01/02/2011  . Pneumococcal Polysaccharide-23 01/31/2010, 03/01/2015  . Tdap 02/18/2014    Diabetes  He presents for his initial diabetic visit. He has type 2 diabetes mellitus. Onset time: He was diagnosed at approximate age of 4 years. His disease course has been worsening (He has prior history of heavy alcohol use/abuse.). There are no hypoglycemic associated symptoms. Pertinent negatives for hypoglycemia include no confusion, headaches, pallor or seizures. Associated symptoms include polydipsia and polyuria. Pertinent negatives for diabetes include no chest pain, no fatigue, no polyphagia and no weakness. There are no hypoglycemic complications. Symptoms  are worsening. Diabetic complications include a CVA. Risk factors for coronary artery disease include dyslipidemia, diabetes mellitus, hypertension, family history, male sex and tobacco exposure. Current diabetic treatment includes insulin injections and oral agent (monotherapy). He is compliant with treatment most of the time. His weight is stable. He is following a generally unhealthy diet. When asked about meal planning, he reported none. He has had a previous visit with a dietitian. He participates in exercise  intermittently. His overall blood glucose range is 140-180 mg/dl. (His A1c was high at 14.6% on 03/12/2017, however his most recent blood glucose averages 162 for the last 30 days.) An ACE inhibitor/angiotensin II receptor blocker is contraindicated.  Hyperlipidemia  This is a chronic problem. The current episode started more than 1 year ago. The problem is controlled. Recent lipid tests were reviewed and are normal. Exacerbating diseases include diabetes. Factors aggravating his hyperlipidemia include smoking. Pertinent negatives include no chest pain, myalgias or shortness of breath. Current antihyperlipidemic treatment includes statins. The current treatment provides moderate improvement of lipids. Risk factors for coronary artery disease include dyslipidemia, diabetes mellitus, hypertension, male sex, family history and a sedentary lifestyle.  Hypertension  This is a chronic problem. The current episode started more than 1 year ago. The problem is controlled. Pertinent negatives include no chest pain, headaches, neck pain, palpitations or shortness of breath. Risk factors for coronary artery disease include dyslipidemia, diabetes mellitus, family history, male gender, sedentary lifestyle and smoking/tobacco exposure. Hypertensive end-organ damage includes CVA.    Review of Systems  Constitutional: Negative for chills, fatigue, fever and unexpected weight change.  HENT: Negative for dental  problem, mouth sores and trouble swallowing.   Eyes: Negative for visual disturbance.  Respiratory: Negative for cough, choking, chest tightness, shortness of breath and wheezing.   Cardiovascular: Negative for chest pain, palpitations and leg swelling.  Gastrointestinal: Negative for abdominal distention, abdominal pain, constipation, diarrhea, nausea and vomiting.  Endocrine: Positive for polydipsia and polyuria. Negative for polyphagia.  Genitourinary: Negative for dysuria, flank pain, hematuria and urgency.  Musculoskeletal: Negative for back pain, gait problem, myalgias and neck pain.  Skin: Negative for pallor, rash and wound.  Neurological: Negative for seizures, syncope, weakness, numbness and headaches.  Psychiatric/Behavioral: Negative.  Negative for confusion and dysphoric mood.    Objective:    BP 121/81   Pulse 86   Ht '6\' 1"'  (1.854 m)   Wt 176 lb (79.8 kg)   BMI 23.22 kg/m   Wt Readings from Last 3 Encounters:  05/05/17 176 lb (79.8 kg)  04/14/17 170 lb 12.8 oz (77.5 kg)  04/13/17 170 lb (77.1 kg)     Physical Exam  Constitutional: He is oriented to person, place, and time. He appears well-developed. He is cooperative. No distress.  HENT:  Head: Normocephalic and atraumatic.  Eyes: EOM are normal.  Neck: Normal range of motion. Neck supple. No tracheal deviation present. No thyromegaly present.  Cardiovascular: Normal rate, S1 normal, S2 normal and normal heart sounds. Exam reveals no gallop.  No murmur heard. Pulses:      Dorsalis pedis pulses are 1+ on the right side, and 1+ on the left side.       Posterior tibial pulses are 1+ on the right side, and 1+ on the left side.  Pulmonary/Chest: Breath sounds normal. No respiratory distress. He has no wheezes.  Abdominal: Soft. Bowel sounds are normal. He exhibits no distension. There is no tenderness. There is no guarding and no CVA tenderness.  Musculoskeletal: He exhibits no edema.       Right shoulder: He  exhibits no swelling and no deformity.  Neurological: He is alert and oriented to person, place, and time. He has normal strength and normal reflexes. No cranial nerve deficit or sensory deficit. Gait normal.  Skin: Skin is warm and dry. No rash noted. No cyanosis. Nails show no clubbing.  Psychiatric: He has a normal mood and affect. His speech is normal. Cognition  and memory are normal.    CMP ( most recent) CMP     Component Value Date/Time   NA 131 (L) 03/15/2017 1544   NA 137 03/12/2017 1021   K 4.5 03/15/2017 1544   CL 95 (L) 03/15/2017 1544   CO2 28 03/15/2017 1544   GLUCOSE 403 (H) 03/15/2017 1544   BUN 18 03/15/2017 1544   BUN 15 03/12/2017 1021   CREATININE 0.82 03/15/2017 1544   CREATININE 0.82 02/16/2014 1053   CALCIUM 9.2 03/15/2017 1544   PROT 6.7 03/12/2017 1021   ALBUMIN 4.0 03/12/2017 1021   AST 17 03/12/2017 1021   ALT 9 03/12/2017 1021   ALKPHOS 86 03/12/2017 1021   BILITOT 0.5 03/12/2017 1021   GFRNONAA >60 03/15/2017 1544   GFRAA >60 03/15/2017 1544     Diabetic Labs (most recent): Lab Results  Component Value Date   HGBA1C 14.6 (H) 03/12/2017   HGBA1C 5.4 04/19/2016   HGBA1C 5.3 12/31/2015     Lipid Panel ( most recent) Lipid Panel     Component Value Date/Time   CHOL 106 03/12/2017 1021   TRIG 189 (H) 03/12/2017 1021   HDL 28 (L) 03/12/2017 1021   CHOLHDL 3.8 03/12/2017 1021   CHOLHDL 3.2 05/03/2016 0545   VLDL 16 05/03/2016 0545   LDLCALC 40 03/12/2017 1021      Lab Results  Component Value Date   TSH 1.960 03/12/2017   TSH 1.460 10/07/2016   TSH 0.720 05/02/2016   TSH 1.895 10/14/2014   FREET4 1.21 10/07/2016      Assessment & Plan:   1. DM type 2 causing vascular disease (Travis [name]) - Travis [name] has currently uncontrolled symptomatic type 2 DM since  57 years of age,  with most recent A1c of 14.6 %. His most recent blood glucose readings are 100 6012 regimen for the last 30 days of 40 readings. Recent labs reviewed.  -his  diabetes is complicated by CVA, history of smoking, history of heavy alcohol use/abuse in the past and Travis [name] remains at a high risk for more acute and chronic complications which include CAD, CVA, CKD, retinopathy, and neuropathy. These are all discussed in detail with the patient.  - I have counseled him on diet management  by adopting a carbohydrate restricted/protein rich diet.  - Suggestion is made for him to avoid simple carbohydrates  from his diet including Cakes, Sweet Desserts, Ice Cream, Soda (diet and regular), Sweet Tea, Candies, Chips, Cookies, Store Bought Juices, Alcohol in Excess of  1-2 drinks a day, Artificial Sweeteners, and "Sugar-free" Products. This will help patient to have stable blood glucose profile and potentially avoid unintended weight gain.  - I encouraged him to switch to  unprocessed or minimally processed complex starch and increased protein intake (animal or plant source), fruits, and vegetables.  - he is advised to stick to a routine mealtimes to eat 3 meals  a day and avoid unnecessary snacks ( to snack only to correct hypoglycemia).   - He is already following with Travis [name], CDE.  - I have approached him with the following individualized plan to manage diabetes and patient agrees:   - There could be a component of pancreatic endocrine and exocrine insufficiency given his prior history of heavy alcohol use, which would make it necessary for him to eventually use insulin basal/bolus control diabetes to target. - Avoiding hypoglycemia will be important component of his treatment with insulin. -  Given his recent improvement in his  general glycemic burden, I will proceed with only basal insulin Levemir at 15 units daily at bedtime, associated with strict monitoring of blood glucose 2 times a day before breakfast and at bedtime until his next lab work  and office visit in 8 weeks.  -Patient is encouraged to call clinic for blood glucose levels less  than 70 or above 300 mg /dl. - I will continue metformin 500 mg by mouth twice a day with meals, therapeutically suitable for patient .  -Patient is not a candidate for SGLT2 inhibitors, nor incretin therapy due to his body habitus.  - Patient specific target  A1c;  LDL, HDL, Triglycerides, and  Waist Circumference were discussed in detail.  2) BP/HTN: Controlled. History records indicate intolerance to lisinopril. His blood pressure is controlled to target at this time was normal medications. 3) Lipids/HPL:   Controlled with LDL at 40.   Patient is advised to continue statins. 4)  Weight/Diet: CDE Consult  is in progress , exercise, and detailed carbohydrates information provided.  5) Chronic Care/Health Maintenance:  -he  is on Statin medications and  is encouraged to continue to follow up with Ophthalmology, Dentist,  Podiatrist at least yearly or according to recommendations, and advised to  stay away from smoking. I have recommended yearly flu vaccine and pneumonia vaccination at least every 5 years; moderate intensity exercise for up to 150 minutes weekly; and  sleep for at least 7 hours a day.  - I advised patient to maintain close follow up with Kathyrn Drown, MD for primary care needs.  - Time spent with the patient: 1 hour, of which >50% was spent in obtaining information about his symptoms, reviewing his previous labs, evaluations, and treatments, counseling him about his   currently uncontrolled complicated type 2 diabetes, hyperlipidemia, and developing a plan for long term treatment; his  questions were answered to his satisfaction.  Follow up plan: - Return in about 8 weeks (around 06/30/2017) for follow up with pre-visit labs, meter, and logs.  Travis Lloyd, MD [facility] [address] [place], [place] [address] Phone: [phone]  Fax: [phone]    05/05/2017, 1:17 PM  This note was partially dictated with voice  recognition software. Similar sounding words can be transcribed inadequately or may not  be corrected upon review.

## 2017-05-05 NOTE — Patient Instructions (Signed)

## 2017-05-19 ENCOUNTER — Encounter: Payer: Self-pay | Admitting: Nutrition

## 2017-05-19 ENCOUNTER — Encounter: Payer: No Typology Code available for payment source | Attending: Family Medicine | Admitting: Nutrition

## 2017-05-19 ENCOUNTER — Other Ambulatory Visit: Payer: Self-pay | Admitting: Family Medicine

## 2017-05-19 VITALS — Wt 175.0 lb

## 2017-05-19 DIAGNOSIS — IMO0002 Reserved for concepts with insufficient information to code with codable children: Secondary | ICD-10-CM

## 2017-05-19 DIAGNOSIS — E1165 Type 2 diabetes mellitus with hyperglycemia: Secondary | ICD-10-CM

## 2017-05-19 DIAGNOSIS — Z8673 Personal history of transient ischemic attack (TIA), and cerebral infarction without residual deficits: Secondary | ICD-10-CM

## 2017-05-19 DIAGNOSIS — E118 Type 2 diabetes mellitus with unspecified complications: Principal | ICD-10-CM

## 2017-05-19 MED FILL — metFORMIN HCL 500 MG TABS: 500 | 30 days supply | Qty: 120 | Fill #1

## 2017-05-19 MED FILL — ATORVASTATIN 20 MG TABLET: 20 | 90 days supply | Qty: 90 | Fill #0

## 2017-05-19 MED FILL — valACYclovir HCL 1 GM TABS: 1 | 30 days supply | Qty: 90 | Fill #2

## 2017-05-19 NOTE — Patient Instructions (Addendum)
Goals 1 Cut down on the jimmy dean fritittas due to high fat and salt content. 2. Choose more eggs and toast and fruit for breakfast. 3. Increase lower carb vegetables with lunch and dinner 4. Keep drinking water. 5. Increase higher fiber foods- dried beans, whole wheat bread, wheat pasta, whole grain cereal.

## 2017-05-19 NOTE — Progress Notes (Signed)
  Medical Nutrition Therapy:  Appt start time: 1100  end time:  1115   Assessment:  Primary concerns today: Type 2 DM. Lives with his wife.   Has forgotten his insulin occasionally. 14 days  Avgs..184 mg/dl 7 days 176 mg/dl. 30 day 174 mg/dl. Has been eating some [place] Fritittas which are high in fat and sodium but lower carb.BS still up and down. Carb intake inconsistent. His wife doesn't cook much and picks up take out a lot. Has cut out using salt. Trying to eat more lower carb vegetables and reducing carbs, but may be too restrictive at times.       Taking 15 units of Levemir daily when he doesn't forget. Metformin 500 mg BID.   Testing 1-2 times per day. Doesn't remember to write down BS readings on sheet. Scheduled to see Dr. Dorris Fetch, Endocrinology Feb 25th, 2018.  Preferred Learning Style:   Auditory  Hands on  Learning Readiness:   Ready  Change in progress   MEDICATIONS: See list   DIETARY INTAKE:  24-hr recall:  B ( AM): [place] sausage jimmy dean fajitia  Snk ( AM):  L ( PM): Steamed vegetables, chicken,  Snk ( PM): 1/2 pb sandwich, or snack bag-cheese balls or lays baked chips D ( PM): Heathy Choice Chicken, noodles meal, sanwich [place] sandwich, water Snk ( PM): water, Beverages: diet sodas,  Usual physical activity: yard work.  Estimated energy needs: 2000  calories 225 g carbohydrates 150 g protein 56 g fat  Progress Towards Goal(s):  In progress.   Nutritional Diagnosis:  NB-1.1 Food and nutrition-related knowledge deficit As related to Diabetes Type 2.  As evidenced by A1C >14%.    Intervention:  Nutrition and Diabetes education provided on My Plate, CHO counting, meal planning, portion sizes, timing of meals, avoiding snacks between meals unless having a low blood sugar, target ranges for A1C and blood sugars, signs/symptoms and treatment of hyper/hypoglycemia, monitoring blood sugars, taking medications as prescribed, benefits of exercising 30  minutes per day and prevention of complications of DM.  Goals 1 Cut down on the jimmy dean fritittas due to high fat and salt content. 2. Choose more eggs and toast and fruit for breakfast. 3. Increase lower carb vegetables with lunch and dinner 4. Keep drinking water. 5. Increase higher fiber foods- dried beans, whole wheat bread, wheat pasta, whole grain cereal.  Teaching Method Utilized:  Visual Auditory Hands on  Handouts given during visit include:  The Plate Method   Meal Plan Card  Diabetes Instructions.   Barriers to learning/adherence to lifestyle change: none  Demonstrated degree of understanding via:  Teach Back   Monitoring/Evaluation:  Dietary intake, exercise, meal planning,SBG, and body weight in 1 month(s).

## 2017-05-20 ENCOUNTER — Ambulatory Visit (INDEPENDENT_AMBULATORY_CARE_PROVIDER_SITE_OTHER): Payer: No Typology Code available for payment source | Admitting: *Deleted

## 2017-05-20 DIAGNOSIS — I63443 Cerebral infarction due to embolism of bilateral cerebellar arteries: Secondary | ICD-10-CM | POA: Diagnosis not present

## 2017-05-20 MED FILL — ALPRAZolam 0.25 MG TABS: 0.25 | 30 days supply | Qty: 60 | Fill #5

## 2017-05-23 NOTE — Progress Notes (Signed)
[facility] Summary Report / Loop Recorder 

## 2017-05-24 LAB — CUP PACEART REMOTE DEVICE CHECK
Date Time Interrogation Session: 20190118213859
MDC IDC PG IMPLANT DT: 20161229

## 2017-05-31 MED FILL — ELIQUIS 5 MG TABLET: 5 | 30 days supply | Qty: 60 | Fill #2

## 2017-06-02 MED FILL — LEVEMIR FLEXTOUCH 100 UNITS: 100 | 30 days supply | Qty: 12 | Fill #1

## 2017-06-02 MED FILL — FREESTYLE LITE TEST STRIP: 90 days supply | Qty: 100 | Fill #1

## 2017-06-14 ENCOUNTER — Other Ambulatory Visit: Payer: Self-pay | Admitting: Family Medicine

## 2017-06-14 MED FILL — metFORMIN HCL 500 MG TABS: 500 | 30 days supply | Qty: 120 | Fill #2

## 2017-06-15 MED FILL — MECLIZINE 25 MG TABLET: 25 | 10 days supply | Qty: 30 | Fill #0

## 2017-06-20 ENCOUNTER — Ambulatory Visit (INDEPENDENT_AMBULATORY_CARE_PROVIDER_SITE_OTHER): Payer: No Typology Code available for payment source | Admitting: *Deleted

## 2017-06-20 DIAGNOSIS — I63443 Cerebral infarction due to embolism of bilateral cerebellar arteries: Secondary | ICD-10-CM | POA: Diagnosis not present

## 2017-06-21 NOTE — Progress Notes (Signed)
[facility] Summary Report / Loop Recorder 

## 2017-06-23 ENCOUNTER — Other Ambulatory Visit: Payer: Self-pay | Admitting: Family Medicine

## 2017-06-23 MED FILL — ELIQUIS 5 MG TABLET: 5 | 30 days supply | Qty: 60 | Fill #3

## 2017-06-23 MED FILL — FOLIC ACID 1 MG TABLET: 1 | 90 days supply | Qty: 90 | Fill #1

## 2017-06-24 ENCOUNTER — Other Ambulatory Visit: Payer: Self-pay | Admitting: Family Medicine

## 2017-06-24 MED FILL — ALPRAZolam 0.25 MG TABS: 0.25 | 30 days supply | Qty: 60 | Fill #0

## 2017-06-24 NOTE — Telephone Encounter (Signed)
May have this +4 refills 

## 2017-06-27 ENCOUNTER — Ambulatory Visit: Payer: No Typology Code available for payment source | Admitting: "Endocrinology

## 2017-06-27 ENCOUNTER — Ambulatory Visit: Payer: Self-pay | Admitting: Nutrition

## 2017-06-29 ENCOUNTER — Ambulatory Visit: Payer: 59 | Admitting: Family Medicine

## 2017-07-12 ENCOUNTER — Other Ambulatory Visit: Payer: Self-pay | Admitting: "Endocrinology

## 2017-07-12 LAB — HEMOGLOBIN A1C: Hemoglobin A1C: 7.6

## 2017-07-12 LAB — VITAMIN D 25 HYDROXY (VIT D DEFICIENCY, FRACTURES): VIT D 25 HYDROXY: 102

## 2017-07-12 LAB — BASIC METABOLIC PANEL
BUN: 19 (ref 4–21)
Creatinine: 1 (ref <=1.3)

## 2017-07-13 ENCOUNTER — Encounter: Payer: Self-pay | Admitting: "Endocrinology

## 2017-07-13 ENCOUNTER — Ambulatory Visit (INDEPENDENT_AMBULATORY_CARE_PROVIDER_SITE_OTHER): Payer: No Typology Code available for payment source | Admitting: "Endocrinology

## 2017-07-13 ENCOUNTER — Encounter: Payer: No Typology Code available for payment source | Attending: Family Medicine | Admitting: Nutrition

## 2017-07-13 ENCOUNTER — Encounter: Payer: Self-pay | Admitting: Nutrition

## 2017-07-13 VITALS — BP 133/80 | HR 76 | Ht 73.0 in | Wt 180.0 lb

## 2017-07-13 VITALS — Ht 73.0 in | Wt 179.0 lb

## 2017-07-13 DIAGNOSIS — E782 Mixed hyperlipidemia: Secondary | ICD-10-CM

## 2017-07-13 DIAGNOSIS — E118 Type 2 diabetes mellitus with unspecified complications: Principal | ICD-10-CM

## 2017-07-13 DIAGNOSIS — E673 Hypervitaminosis D: Secondary | ICD-10-CM | POA: Diagnosis not present

## 2017-07-13 DIAGNOSIS — E1159 Type 2 diabetes mellitus with other circulatory complications: Secondary | ICD-10-CM | POA: Diagnosis not present

## 2017-07-13 DIAGNOSIS — E1165 Type 2 diabetes mellitus with hyperglycemia: Secondary | ICD-10-CM

## 2017-07-13 DIAGNOSIS — IMO0002 Reserved for concepts with insufficient information to code with codable children: Secondary | ICD-10-CM

## 2017-07-13 LAB — COMPREHENSIVE METABOLIC PANEL
ALT: 14 IU/L (ref 0–44)
AST: 27 IU/L (ref 0–40)
Albumin/Globulin Ratio: 1.7 (ref 1.2–2.2)
Albumin: 4.3 g/dL (ref 3.5–5.5)
Alkaline Phosphatase: 77 IU/L (ref 39–117)
BUN/Creatinine Ratio: 19 (ref 9–20)
BUN: 19 mg/dL (ref 6–24)
Bilirubin Total: 0.5 mg/dL (ref 0.0–1.2)
CALCIUM: 9.3 mg/dL (ref 8.7–10.2)
CO2: 23 mmol/L (ref 20–29)
CREATININE: 0.98 mg/dL (ref 0.76–1.27)
Chloride: 105 mmol/L (ref 96–106)
GFR calc Af Amer: 97 mL/min/{1.73_m2} (ref >59)
GFR, EST NON AFRICAN AMERICAN: 84 mL/min/{1.73_m2} (ref >59)
Globulin, Total: 2.5 g/dL (ref 1.5–4.5)
Glucose: 205 mg/dL — ABNORMAL HIGH (ref 65–99)
Potassium: 4.3 mmol/L (ref 3.5–5.2)
SODIUM: 142 mmol/L (ref 134–144)
Total Protein: 6.8 g/dL (ref 6.0–8.5)

## 2017-07-13 LAB — VITAMIN D 25 HYDROXY (VIT D DEFICIENCY, FRACTURES): VIT D 25 HYDROXY: 102 ng/mL — AB (ref 30.0–100.0)

## 2017-07-13 LAB — MICROALBUMIN / CREATININE URINE RATIO
Creatinine, Urine: 130.3 mg/dL
MICROALB/CREAT RATIO: 5.6 mg/g{creat} (ref 0.0–30.0)
MICROALBUM., U, RANDOM: 7.3 ug/mL

## 2017-07-13 LAB — HGB A1C W/O EAG: HEMOGLOBIN A1C: 7.6 % — AB (ref 4.8–5.6)

## 2017-07-13 LAB — SPECIMEN STATUS REPORT

## 2017-07-13 NOTE — Progress Notes (Signed)
  Medical Nutrition Therapy:  Appt start time: 2376  end time: 1600   Assessment:  Primary concerns today: Type 2 DM. Lives with his wife.   He is doing excellent. A1C down to 7.6% from 14%.   Has forgotten his insulin occasionally. 14 days  Avgs..184 mg/dl 7 days 176 mg/dl. 30 day 174 mg/dl. Metformin 500 mg BID and Levemir 15 units daily.   Testing 1-2 times per day. Doesn't remember to write down BS readings on sheet. Sees Dr.  Dorris Fetch, Endocrinology. Wt stable.  Preferred Learning Style:   Auditory  Hands on  Learning Readiness:   Ready  Change in progress   MEDICATIONS: See list   DIETARY INTAKE:  24-hr recall:  B ( AM): [place] sausage jimmy dean fajitia  Snk ( AM):  L ( PM): Steamed vegetables, chicken,  Snk ( PM): 1/2 pb sandwich, or snack bag-cheese balls or lays baked chips D ( PM): Heathy Choice Chicken, noodles meal, sanwich [place] sandwich, water Snk ( PM): water, Beverages: diet sodas,  Usual physical activity: yard work.  Estimated energy needs: 2000  calories 225 g carbohydrates 150 g protein 56 g fat  Progress Towards Goal(s):  In progress.   Nutritional Diagnosis:  NB-1.1 Food and nutrition-related knowledge deficit As related to Diabetes Type 2.  As evidenced by A1C >14%.    Intervention:  Nutrition and Diabetes education provided on My Plate, CHO counting, meal planning, portion sizes, timing of meals, avoiding snacks between meals unless having a low blood sugar, target ranges for A1C and blood sugars, signs/symptoms and treatment of hyper/hypoglycemia, monitoring blood sugars, taking medications as prescribed, benefits of exercising 30 minutes per day and prevention of complications of DM.  Goals Keep up the great job!! Prevent low blood sugars Increae fresh fruits and vegetables. Keep drinking water Keep staying active. Keep A1C to 7.0% or less Teaching Method Utilized:  Visual Auditory Hands on  Handouts given during visit  include:  The Plate Method   Meal Plan Card  Diabetes Instructions.   Barriers to learning/adherence to lifestyle change: none  Demonstrated degree of understanding via:  Teach Back   Monitoring/Evaluation:  Dietary intake, exercise, meal planning,SBG, and body weight in 3 month(s).

## 2017-07-13 NOTE — Progress Notes (Signed)
Consult Note       07/13/2017, 9:14 PM   Subjective:    Patient ID: Travis [name], male    DOB: 1960/06/22.  Travis [name] is being seen in consultation for management of currently uncontrolled symptomatic diabetes requested by  Kathyrn Drown, MD.   Past Medical History:  Diagnosis Date  . A-fib ([place]) 02/2016   found on loop recorder  . 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 Anxiety   . Arthritis   . Blood transfusion without reported diagnosis   . Chronic headaches   . Complication of anesthesia    bleed after last shoulder-had to stay overnight  . Depression   . Diabetes mellitus   . Diabetic peripheral neuropathy ([place]) 03/28/2013  . Diverticulosis   . Dizziness   . Hernia, inguinal   . Hyperlipidemia   . Hypertension   . Impingement syndrome of left shoulder 08/02/2014  . 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 Neuropathy of lower extremity   . Night sweats    every once in a while  . Sleep apnea    no CPAP  . Stroke ([place]) 02/2015  . Syncope and collapse    Past Surgical History:  Procedure Laterality Date  . CERVICAL DISC SURGERY    . COLON SURGERY  2003   1/3 removed for diverticulitis  . COLONOSCOPY    . EP IMPLANTABLE DEVICE N/A 05/01/2015   Procedure: Loop Recorder Insertion;  Surgeon: [name], MD;  Location: [facility];  Service: Cardiovascular;  Laterality: N/A;  . INGUINAL HERNIA REPAIR Bilateral   . KNEE ARTHROSCOPY Right   . SHOULDER ARTHROSCOPY Right    1  . SHOULDER ARTHROSCOPY Left 08/02/2014   Procedure: LEFT SHOULDER SCOPE DEBRIDEMENT/ACROMIOPLASTY;  Surgeon: [name], MD;  Location: [facility];  Service: Orthopedics;  Laterality: Left;  ANESTHESIA: GENERAL, PRE/POST OP SCALENE  . TEE WITHOUT CARDIOVERSION N/A 03/03/2015   Procedure: TRANSESOPHAGEAL ECHOCARDIOGRAM (TEE);  Surgeon: [name], MD;  [facility];  Service: Cardiology;  Laterality: N/A;  . UMBILICAL HERNIA REPAIR     with other hernia repair with mesh  . WRIST SURGERY Right    fusion   Social History   Socioeconomic History  . Marital status: Married    Spouse name: None  . Number of children: 2  . Years of education: College  . Highest education level: None  Social Needs  . Financial resource strain: None  . Food insecurity - worry: None  . Food insecurity - inability: None  . Transportation needs - medical: None  . Transportation needs - non-medical: None  Occupational History  . Occupation: disabled  Tobacco Use  . Smoking status: Former Smoker    Packs/day: 1.00    Years: 8.00    Pack years: 8.00    Types: Cigarettes    Start date: 06/07/1970    Last attempt to quit: 05/03/1978    Years since quitting: 39.2  . Smokeless tobacco: Former Systems developer    Types: [place] date: 05/03/1978  Substance and Sexual Activity  . Alcohol use:  No    Alcohol/week: 0.6 oz    Types: 1 Cans of beer per week    Comment: h/o heavy use in the past  . Drug use: No  . Sexual activity: None  Other Topics Concern  . None  Social History Narrative   Drinks some coffee, Drink diet sodas and tea   Outpatient Encounter Medications as of 07/13/2017  Medication Sig  . ALPRAZolam (XANAX) 0.25 MG tablet TAKE 1 TABLET BY MOUTH TWICE DAILY AS NEEDED  . apixaban (ELIQUIS) 5 MG TABS tablet Take 1 tablet (5 mg total) by mouth 2 (two) times daily.  Marland Kitchen aspirin EC 81 MG tablet Take 1 tablet (81 mg total) by mouth daily.  Marland Kitchen atorvastatin (LIPITOR) 20 MG tablet TAKE 1 TABLET BY MOUTH DAILY.  Marland Kitchen Blood Glucose Monitoring Suppl (BLOOD GLUCOSE MONITOR SYSTEM) w/Device KIT Test glucose 1 time daily.  . folic acid (FOLVITE) 1 MG tablet TAKE 1 TABLET BY MOUTH DAILY.  Marland Kitchen Insulin Detemir (LEVEMIR) 100 UNIT/ML Pen Inject 15 Units into the skin daily at 10 pm.  . meclizine (ANTIVERT) 25 MG tablet TAKE 1 TABLET BY MOUTH 3 TIMES DAILY AS NEEDED FOR DIZZINESS.  .  metFORMIN (GLUCOPHAGE) 500 MG tablet Take 2 tablets by mouth twice daily  . Multiple Vitamin (MULTIVITAMIN WITH MINERALS) TABS tablet Take 1 tablet daily by mouth.  . pregabalin (LYRICA) 100 MG capsule Take 1 capsule (100 mg total) by mouth 3 (three) times daily.  . traMADol (ULTRAM) 50 MG tablet TAKE 1 TABLET BY MOUTH THREE TIMES DAILY AS NEEDED FOR MODREATE PAIN (Patient not taking: Reported on 07/13/2017)  . valACYclovir (VALTREX) 1000 MG tablet TAKE 1 TABLET BY MOUTH ONCE DAILY  . [DISCONTINUED] Cholecalciferol (VITAMIN D) 400 UNITS capsule Take 400 Units by mouth daily.     No facility-administered encounter medications on file as of 07/13/2017.     ALLERGIES: Allergies  Allergen Reactions  . Lisinopril Cough    Patient/spouse is not aware/familiar with why this is listed as an allergy    VACCINATION STATUS: Immunization History  Administered Date(s) Administered  . Influenza Split 03/28/2013  . Influenza,inj,Quad PF,6+ Mos 02/18/2014, 01/23/2015, 02/06/2016, 02/23/2017  . Influenza-Unspecified 01/02/2011  . Pneumococcal Polysaccharide-23 01/31/2010, 03/01/2015  . Tdap 02/18/2014    Diabetes  He presents for his follow-up diabetic visit. He has type 2 diabetes mellitus. Onset time: He was diagnosed at approximate age of 46 years. His disease course has been improving (He has prior history of heavy alcohol use/abuse.). There are no hypoglycemic associated symptoms. Pertinent negatives for hypoglycemia include no confusion, headaches, pallor or seizures. Pertinent negatives for diabetes include no chest pain, no fatigue, no polydipsia, no polyphagia, no polyuria and no weakness. There are no hypoglycemic complications. Symptoms are improving. Diabetic complications include a CVA. Risk factors for coronary artery disease include dyslipidemia, diabetes mellitus, hypertension, family history, male sex and tobacco exposure. Current diabetic treatment includes insulin injections and oral  agent (monotherapy). He is compliant with treatment most of the time. His weight is stable. He is following a generally unhealthy diet. When asked about meal planning, he reported none. He has had a previous visit with a dietitian. He participates in exercise intermittently. His breakfast blood glucose range is generally 140-180 mg/dl. His overall blood glucose range is 140-180 mg/dl. An ACE inhibitor/angiotensin II receptor blocker is contraindicated.  Hyperlipidemia  This is a chronic problem. The current episode started more than 1 year ago. The problem is controlled. Recent lipid tests were reviewed  and are normal. Exacerbating diseases include diabetes. Factors aggravating his hyperlipidemia include smoking. Pertinent negatives include no chest pain, myalgias or shortness of breath. Current antihyperlipidemic treatment includes statins. The current treatment provides moderate improvement of lipids. Risk factors for coronary artery disease include dyslipidemia, diabetes mellitus, hypertension, male sex, family history and a sedentary lifestyle.  Hypertension  This is a chronic problem. The current episode started more than 1 year ago. The problem is controlled. Pertinent negatives include no chest pain, headaches, neck pain, palpitations or shortness of breath. Risk factors for coronary artery disease include dyslipidemia, diabetes mellitus, family history, male gender, sedentary lifestyle and smoking/tobacco exposure. Hypertensive end-organ damage includes CVA.    Review of Systems  Constitutional: Negative for chills, fatigue, fever and unexpected weight change.  HENT: Negative for dental problem, mouth sores and trouble swallowing.   Eyes: Negative for visual disturbance.  Respiratory: Negative for cough, choking, chest tightness, shortness of breath and wheezing.   Cardiovascular: Negative for chest pain, palpitations and leg swelling.  Gastrointestinal: Negative for abdominal distention,  abdominal pain, constipation, diarrhea, nausea and vomiting.  Endocrine: Negative for polydipsia, polyphagia and polyuria.  Genitourinary: Negative for dysuria, flank pain, hematuria and urgency.  Musculoskeletal: Negative for back pain, gait problem, myalgias and neck pain.  Skin: Negative for pallor, rash and wound.  Neurological: Negative for seizures, syncope, weakness, numbness and headaches.  Psychiatric/Behavioral: Negative.  Negative for confusion and dysphoric mood.    Objective:    BP 133/80   Pulse 76   Ht '6\' 1"'  (1.854 m)   Wt 180 lb (81.6 kg)   BMI 23.75 kg/m   Wt Readings from Last 3 Encounters:  07/13/17 179 lb (81.2 kg)  07/13/17 180 lb (81.6 kg)  05/19/17 175 lb (79.4 kg)     Physical Exam  Constitutional: He is oriented to person, place, and time. He appears well-developed. He is cooperative. No distress.  HENT:  Head: Normocephalic and atraumatic.  Eyes: EOM are normal.  Neck: Normal range of motion. Neck supple. No tracheal deviation present. No thyromegaly present.  Cardiovascular: Normal rate, S1 normal, S2 normal and normal heart sounds. Exam reveals no gallop.  No murmur heard. Pulses:      Dorsalis pedis pulses are 1+ on the right side, and 1+ on the left side.       Posterior tibial pulses are 1+ on the right side, and 1+ on the left side.  Pulmonary/Chest: Breath sounds normal. No respiratory distress. He has no wheezes.  Abdominal: Soft. Bowel sounds are normal. He exhibits no distension. There is no tenderness. There is no guarding and no CVA tenderness.  Musculoskeletal: He exhibits no edema.       Right shoulder: He exhibits no swelling and no deformity.  Neurological: He is alert and oriented to person, place, and time. He has normal strength and normal reflexes. No cranial nerve deficit or sensory deficit. Gait normal.  Skin: Skin is warm and dry. No rash noted. No cyanosis. Nails show no clubbing.  Psychiatric: He has a normal mood and affect.  His speech is normal. Cognition and memory are normal.    CMP ( most recent) CMP     Component Value Date/Time   NA 131 (L) 03/15/2017 1544   NA 137 03/12/2017 1021   K 4.5 03/15/2017 1544   CL 95 (L) 03/15/2017 1544   CO2 28 03/15/2017 1544   GLUCOSE 403 (H) 03/15/2017 1544   BUN 19 07/12/2017   CREATININE 1.0 07/12/2017  CREATININE 0.82 03/15/2017 1544   CREATININE 0.82 02/16/2014 1053   CALCIUM 9.2 03/15/2017 1544   PROT 6.7 03/12/2017 1021   ALBUMIN 4.0 03/12/2017 1021   AST 17 03/12/2017 1021   ALT 9 03/12/2017 1021   ALKPHOS 86 03/12/2017 1021   BILITOT 0.5 03/12/2017 1021   GFRNONAA >60 03/15/2017 1544   GFRAA >60 03/15/2017 1544     Diabetic Labs (most recent): Lab Results  Component Value Date   HGBA1C 7.6 07/12/2017   HGBA1C 14.6 (H) 03/12/2017   HGBA1C 5.4 04/19/2016     Lipid Panel ( most recent) Lipid Panel     Component Value Date/Time   CHOL 106 03/12/2017 1021   TRIG 189 (H) 03/12/2017 1021   HDL 28 (L) 03/12/2017 1021   CHOLHDL 3.8 03/12/2017 1021   CHOLHDL 3.2 05/03/2016 0545   VLDL 16 05/03/2016 0545   LDLCALC 40 03/12/2017 1021      Lab Results  Component Value Date   TSH 1.960 03/12/2017   TSH 1.460 10/07/2016   TSH 0.720 05/02/2016   TSH 1.895 10/14/2014   FREET4 1.21 10/07/2016      Assessment & Plan:   1. DM type 2 causing vascular disease ([place]) - Jackey Loge has currently uncontrolled symptomatic type 2 DM since  57 years of age. He came with significantly improved a1c of 7.6% from 14.6 %. His most recent blood glucose readings are near target. Recent labs reviewed , showing hypervitaminosis D. He is taking vitamin supplements from OTC.  -his diabetes is complicated by CVA, history of smoking, history of heavy alcohol use/abuse in the past and REGINALD WEIDA remains at a high risk for more acute and chronic complications which include CAD, CVA, CKD, retinopathy, and neuropathy. These are all discussed in detail with  the patient.  - I have counseled him on diet management  by adopting a carbohydrate restricted/protein rich diet.  -  Suggestion is made for him to avoid simple carbohydrates  from his diet including Cakes, Sweet Desserts / Pastries, Ice Cream, Soda (diet and regular), Sweet Tea, Candies, Chips, Cookies, Store Bought Juices, Alcohol in Excess of  1-2 drinks a day, Artificial Sweeteners, and "Sugar-free" Products. This will help patient to have stable blood glucose profile and potentially avoid unintended weight gain.   - I encouraged him to switch to  unprocessed or minimally processed complex starch and increased protein intake (animal or plant source), fruits, and vegetables.  - he is advised to stick to a routine mealtimes to eat 3 meals  a day and avoid unnecessary snacks ( to snack only to correct hypoglycemia).   - He is already following with Kieth Brightly crumpton, CDE.  - I have approached him with the following individualized plan to manage diabetes and patient agrees:   - There could be a component of pancreatic endocrine and exocrine insufficiency given his prior history of heavy alcohol use, which would make it necessary for him to eventually use insulin basal/bolus to  control diabetes to target. - Avoiding hypoglycemia will be important component of his treatment with insulin. -  For now, he is doing very well on basal insulin alone. -  I will proceed with only basal insulin Levemir at 15 units daily at bedtime, associated with strict monitoring of blood glucose 2 times a day before breakfast and at bedtime until his next lab work  and office visit in 8 weeks.  -Patient is encouraged to call clinic for blood glucose levels less  than 70 or above 300 mg /dl. - I will continue metformin 500 mg by mouth twice a day with meals, therapeutically suitable for patient .  -Patient is not a candidate for SGLT2 inhibitors, nor incretin therapy due to his body habitus.  - Patient specific target   A1c;  LDL, HDL, Triglycerides, and  Waist Circumference were discussed in detail.  2) BP/HTN:  History records indicate intolerance to lisinopril. His blood pressure is controlled to target at this time was normal medications. 3) Lipids/HPL:   Controlled with LDL at 40.   Patient is advised to continue statins. 4)  Weight/Diet: CDE Consult  is in progress , exercise, and detailed carbohydrates information provided.   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 He is advised to d/c all vitamin D supplements.  6) Chronic Care/Health Maintenance:  -he  is on Statin medications and  is encouraged to continue to follow up with Ophthalmology, Dentist,  Podiatrist at least yearly or according to recommendations, and advised to  stay away from smoking. I have recommended yearly flu vaccine and pneumonia vaccination at least every 5 years; moderate intensity exercise for up to 150 minutes weekly; and  sleep for at least 7 hours a day.  - I advised patient to maintain close follow up with Kathyrn Drown, MD for primary care needs.  - Time spent with the patient: 25 min, of which >50% was spent in reviewing his blood glucose logs , discussing his hypo- and hyper-glycemic episodes, reviewing his current and  previous labs and insulin doses and developing a plan to avoid hypo- and hyper-glycemia. Please refer to Patient Instructions for Blood Glucose Monitoring and Insulin/Medications Dosing Guide"  in media tab for additional information. Jackey Loge participated in the discussions, expressed understanding, and voiced agreement with the above plans.  All questions were answered to his satisfaction. he is encouraged to contact clinic should he have any questions or concerns prior to his return visit.   Follow up plan: - Return in about 3 months (around 10/13/2017) for follow up with pre-visit labs, meter, and logs.  Glade Lloyd, MD [facility] [address] [place], [place] [address] Phone: [phone]  Fax: [phone]    07/13/2017, 9:14 PM  This note was partially dictated with voice recognition software. Similar sounding words can be transcribed inadequately or may not  be corrected upon review.

## 2017-07-13 NOTE — Patient Instructions (Signed)
Goals Keep up the great job!! Prevent low blood sugars Increae fresh fruits and vegetables. Keep drinking water Keep staying active. Keep A1C to 7.0% or less.

## 2017-07-18 LAB — CUP PACEART REMOTE DEVICE CHECK
Implantable Pulse Generator Implant Date: 20161229
MDC IDC SESS DTM: 20190218205935

## 2017-07-21 ENCOUNTER — Encounter: Payer: Self-pay | Admitting: Family Medicine

## 2017-07-21 ENCOUNTER — Ambulatory Visit (INDEPENDENT_AMBULATORY_CARE_PROVIDER_SITE_OTHER): Payer: No Typology Code available for payment source | Admitting: Family Medicine

## 2017-07-21 VITALS — BP 120/76 | Ht 73.0 in | Wt 177.2 lb

## 2017-07-21 DIAGNOSIS — B9689 Other specified bacterial agents as the cause of diseases classified elsewhere: Secondary | ICD-10-CM

## 2017-07-21 DIAGNOSIS — J019 Acute sinusitis, unspecified: Secondary | ICD-10-CM

## 2017-07-21 MED ORDER — AMOXICILLIN 500 MG PO TABS: 500.0000 mg | ORAL_TABLET | Freq: Three times a day (TID) | ORAL | 0 refills | Status: DC

## 2017-07-21 MED FILL — MECLIZINE 25 MG TABLET: 25 | 10 days supply | Qty: 30 | Fill #1

## 2017-07-21 MED FILL — metFORMIN HCL 500 MG TABS: 500 | 30 days supply | Qty: 120 | Fill #3

## 2017-07-21 MED FILL — ELIQUIS 5 MG TABLET: 5 | 30 days supply | Qty: 60 | Fill #4

## 2017-07-21 NOTE — Progress Notes (Signed)
   Subjective:    Patient ID: Travis [name], male    DOB: 10/01/1960, 57 y.o.   MRN: [id number]  Diabetes  He presents for his follow-up diabetic visit. He has type 2 diabetes mellitus. Pertinent negatives for diabetes include no chest pain.  A1C checked last week by Dr. Dorris Fetch Pt is having sinus issues Significant sinus drainage coughing not feeling good denies wheezing difficulty breathing relates compliance with his medications.  Sees Dr. Dorris Fetch for diabetes denies any cardiac symptoms does have neuropathy in his feet  Review of Systems  Constitutional: Negative for activity change, chills and fever.  HENT: Positive for congestion and rhinorrhea. Negative for ear pain.   Eyes: Negative for discharge.  Respiratory: Positive for cough. Negative for wheezing.   Cardiovascular: Negative for chest pain.  Gastrointestinal: Negative for nausea and vomiting.  Musculoskeletal: Negative for arthralgias.       Objective:   Physical Exam  Constitutional: He appears well-developed.  HENT:  Head: Normocephalic and atraumatic.  Mouth/Throat: Oropharynx is clear and moist. No oropharyngeal exudate.  Eyes: Right eye exhibits no discharge. Left eye exhibits no discharge.  Neck: Normal range of motion.  Cardiovascular: Normal rate, regular rhythm and normal heart sounds.  No murmur heard. Pulmonary/Chest: Effort normal and breath sounds normal. No respiratory distress. He has no wheezes. He has no rales.  Lymphadenopathy:    He has no cervical adenopathy.  Neurological: He exhibits normal muscle tone.  Skin: Skin is warm and dry.  Nursing note and vitals reviewed.   Diabetic foot exam completed      Assessment & Plan:  Patient was seen today for upper respiratory illness. It is felt that the patient is dealing with sinusitis. Antibiotics were prescribed today. Importance of compliance with medication was discussed. Symptoms should gradually resolve over the course of the next several days.  If high fevers, progressive illness, difficulty breathing, worsening condition or failure for symptoms to improve over the next several days then the patient is to follow-up. If any emergent conditions the patient is to follow-up in the emergency department otherwise to follow-up in the office. Continue other chronic medications. Keep follow-ups with Dr. Dorris Fetch Follow-up 6 months sooner problems

## 2017-07-22 MED FILL — ALPRAZolam 0.25 MG TABS: 0.25 | 30 days supply | Qty: 60 | Fill #1

## 2017-07-25 ENCOUNTER — Ambulatory Visit (INDEPENDENT_AMBULATORY_CARE_PROVIDER_SITE_OTHER): Payer: No Typology Code available for payment source | Admitting: *Deleted

## 2017-07-25 DIAGNOSIS — I63443 Cerebral infarction due to embolism of bilateral cerebellar arteries: Secondary | ICD-10-CM

## 2017-07-25 NOTE — Progress Notes (Signed)
[facility] Summary Report / Loop Recorder 

## 2017-08-18 ENCOUNTER — Other Ambulatory Visit: Payer: Self-pay | Admitting: Family Medicine

## 2017-08-18 MED FILL — LEVEMIR FLEXTOUCH 100 UNITS: 100 | 30 days supply | Qty: 12 | Fill #2

## 2017-08-18 MED FILL — metFORMIN HCL 500 MG TABS: 500 | 30 days supply | Qty: 120 | Fill #4

## 2017-08-18 MED FILL — valACYclovir HCL 1 GM TABS: 1 | 30 days supply | Qty: 90 | Fill #3

## 2017-08-18 MED FILL — ELIQUIS 5 MG TABLET: 5 | 30 days supply | Qty: 60 | Fill #5

## 2017-08-18 MED FILL — FREESTYLE LITE TEST STRIP: 90 days supply | Qty: 100 | Fill #2

## 2017-08-18 MED FILL — ATORVASTATIN 20 MG TABLET: 20 | 90 days supply | Qty: 90 | Fill #0

## 2017-08-18 NOTE — Telephone Encounter (Signed)
Ok for 90 days and 1 refill

## 2017-08-19 MED FILL — ALPRAZolam 0.25 MG TABS: 0.25 | 30 days supply | Qty: 60 | Fill #2

## 2017-08-19 MED FILL — LYRICA 100 MG CAPSULE: 100 | 30 days supply | Qty: 90 | Fill #0

## 2017-08-25 ENCOUNTER — Ambulatory Visit (INDEPENDENT_AMBULATORY_CARE_PROVIDER_SITE_OTHER): Payer: No Typology Code available for payment source | Admitting: *Deleted

## 2017-08-25 DIAGNOSIS — I63443 Cerebral infarction due to embolism of bilateral cerebellar arteries: Secondary | ICD-10-CM | POA: Diagnosis not present

## 2017-08-26 NOTE — Progress Notes (Signed)
[facility] Summary Report / Loop Recorder 

## 2017-08-31 LAB — CUP PACEART REMOTE DEVICE CHECK
Date Time Interrogation Session: 20190323200929
MDC IDC PG IMPLANT DT: 20161229

## 2017-09-06 ENCOUNTER — Encounter: Payer: Self-pay | Admitting: Family Medicine

## 2017-09-06 ENCOUNTER — Telehealth: Payer: Self-pay | Admitting: Family Medicine

## 2017-09-06 ENCOUNTER — Ambulatory Visit (INDEPENDENT_AMBULATORY_CARE_PROVIDER_SITE_OTHER): Payer: No Typology Code available for payment source | Admitting: Family Medicine

## 2017-09-06 VITALS — BP 112/72 | Ht 73.0 in | Wt 177.0 lb

## 2017-09-06 DIAGNOSIS — B351 Tinea unguium: Secondary | ICD-10-CM

## 2017-09-06 MED FILL — MECLIZINE 25 MG TABLET: 25 | 10 days supply | Qty: 30 | Fill #2

## 2017-09-06 NOTE — Telephone Encounter (Signed)
Yes it is fine if they prefer that please let the nurses know and we can put in the referral for [facility]

## 2017-09-06 NOTE — Progress Notes (Signed)
   Subjective:    Patient ID: Travis [name], male    DOB: 1960-07-29, 57 y.o.   MRN: [id number]  HPI Patient arrives with pain in left great toe. Patient relates thickness of the toenail relates pain discomfort relates darkening area denies any other type of injury Denies drainage pus fevers chills he is a diabetic  Review of Systems Please see above denies ankle pain Pain knee pain fever chills sweats rash does relate some great toe pain    Objective:   Physical Exam Lungs are clear respiratory rate normal heart is regular no murmurs lower calf nontender ankle nontender great toe does have thickening consistent with possible tinea but also has a dark streak in a dark area need to rule out melanoma   Patient is to keep all regular follow-up visits for diabetes    Assessment & Plan:  I believe this patient needs referral More than likely will have to have the great toenail removed The [place] area should be examined to make sure it is not melanoma The patient was informed that this More than likely tinea Patient will gather some clippings to send for culture

## 2017-09-06 NOTE — Telephone Encounter (Signed)
I called and left a message to r/c. 

## 2017-09-06 NOTE — Telephone Encounter (Signed)
Patients spouse, Lynelle Smoke, called and said that it is a long waiting list to get into dermatology because the list of in-network providers is so limited for the [facility] Focus Plan.  She wants to know if this is something that can be handled at [facility]?

## 2017-09-07 ENCOUNTER — Other Ambulatory Visit: Payer: Self-pay | Admitting: Family Medicine

## 2017-09-07 ENCOUNTER — Encounter (INDEPENDENT_AMBULATORY_CARE_PROVIDER_SITE_OTHER): Payer: Self-pay

## 2017-09-07 DIAGNOSIS — L989 Disorder of the skin and subcutaneous tissue, unspecified: Secondary | ICD-10-CM

## 2017-09-07 NOTE — Telephone Encounter (Signed)
Pts wife returned call. Referral placed to [facility]

## 2017-09-08 ENCOUNTER — Other Ambulatory Visit: Payer: Self-pay | Admitting: Family Medicine

## 2017-09-08 DIAGNOSIS — B351 Tinea unguium: Secondary | ICD-10-CM

## 2017-09-15 ENCOUNTER — Encounter: Payer: Self-pay | Admitting: Podiatry

## 2017-09-15 ENCOUNTER — Ambulatory Visit: Payer: No Typology Code available for payment source | Admitting: Podiatry

## 2017-09-15 VITALS — BP 133/73 | HR 85 | Resp 16

## 2017-09-15 DIAGNOSIS — D492 Neoplasm of unspecified behavior of bone, soft tissue, and skin: Secondary | ICD-10-CM

## 2017-09-15 NOTE — Progress Notes (Signed)
Subjective:   Patient ID: Travis [name], male   DOB: 57 y.o.   MRN: [id number]   HPI Patient presents stating that he had a fungal culture which was negative and he has a black line within the middle of his left nail that he was concerned about.  States is been present for several months but is not a good historian.  Patient does not smoke and is marginally active   Review of Systems  All other systems reviewed and are negative.       Objective:  Physical Exam  Constitutional: He appears well-developed and well-nourished.  Cardiovascular: Intact distal pulses.  Pulmonary/Chest: Effort normal.  Musculoskeletal: Normal range of motion.  Neurological: He is alert.  Skin: Skin is warm.  Nursing note and vitals reviewed.   Neurovascular status intact muscle strength is adequate range of motion within normal limits with patient noted to have a small area of black discoloration with alignment measuring about 6 mm in length 3 mm in width in the left hallux that is not tender but has been present for a while.  Patient has good digital perfusion well oriented x3     Assessment:  Possibility for unknown condition with probability that this is trauma but cannot rule out melanoma     Plan:  H&P condition reviewed explained and today I did a proximal nerve block and I then went ahead and using sterile instrumentation I took out the central portion of the nailbed and surrounding tissue and nail and I am sending this in for pathological evaluation.  I gave instructions on soaks and applied sterile dressing and reappoint 3 weeks to review the results and I will call him if I see anything of difference

## 2017-09-15 NOTE — Progress Notes (Signed)
   Subjective:    Patient ID: Travis [name], male    DOB: 1960-06-08, 57 y.o.   MRN: [id number]  HPI    Review of Systems  All other systems reviewed and are negative.      Objective:   Physical Exam        Assessment & Plan:

## 2017-09-15 NOTE — Patient Instructions (Signed)

## 2017-09-16 ENCOUNTER — Telehealth: Payer: Self-pay | Admitting: Family Medicine

## 2017-09-16 MED FILL — metFORMIN HCL 500 MG TABS: 500 | 30 days supply | Qty: 120 | Fill #5

## 2017-09-16 MED FILL — FREESTYLE LITE TEST STRIP: 90 days supply | Qty: 300 | Fill #0

## 2017-09-16 NOTE — Telephone Encounter (Signed)
Called [facility] pharm and gave verbal order for test strips since pt did not know the name of strips per [name].  Test 3 times daily since he is on insulin. Pt notified.

## 2017-09-16 NOTE — Telephone Encounter (Signed)
Travis [name] has been having to tests his sugars more often than once a day because at one point it was running high. Original rx for test strips says once a day so he ran out early.  Travis [name] said Travis [name] just called her and said he didn't have enough to last through the weekend and she wanted to get a new rx sent to [facility] to test at least 2 times per day.  She has to be a [facility] for a meeting today and said she will pick up the rx while she is in  if we can do it.

## 2017-09-20 ENCOUNTER — Telehealth: Payer: Self-pay | Admitting: Cardiology

## 2017-09-20 LAB — CUP PACEART REMOTE DEVICE CHECK
Date Time Interrogation Session: 20190425203900
Implantable Pulse Generator Implant Date: 20161229

## 2017-09-20 NOTE — Telephone Encounter (Signed)
LMOVM requesting that pt send manual transmission b/c home monitor has not updated in at least 14 days.    

## 2017-09-23 MED FILL — ALPRAZolam 0.25 MG TABS: 0.25 | 30 days supply | Qty: 60 | Fill #3

## 2017-09-25 ENCOUNTER — Telehealth: Payer: Self-pay | Admitting: Family Medicine

## 2017-09-25 NOTE — Telephone Encounter (Signed)
We will send communication to his cardiologist to find out if they want him to continue Eliquis

## 2017-09-27 ENCOUNTER — Ambulatory Visit (INDEPENDENT_AMBULATORY_CARE_PROVIDER_SITE_OTHER): Payer: No Typology Code available for payment source | Admitting: *Deleted

## 2017-09-27 DIAGNOSIS — I63443 Cerebral infarction due to embolism of bilateral cerebellar arteries: Secondary | ICD-10-CM

## 2017-09-28 NOTE — Progress Notes (Signed)
[facility] Summary Report / Loop Recorder 

## 2017-09-28 NOTE — Telephone Encounter (Signed)
It should be noted that I reviewed the neurologist note from December 2018 based upon their evaluation and thorough documentation it is best for the patient to continue his Eliquis.

## 2017-09-29 ENCOUNTER — Other Ambulatory Visit: Payer: Self-pay | Admitting: Family Medicine

## 2017-09-29 MED FILL — ELIQUIS 5 MG TABLET: 5 | 30 days supply | Qty: 60 | Fill #6

## 2017-09-29 MED FILL — FOLIC ACID 1 MG TABS: 1 | 90 days supply | Qty: 90 | Fill #0

## 2017-09-30 ENCOUNTER — Encounter: Payer: Self-pay | Admitting: Cardiology

## 2017-10-06 ENCOUNTER — Ambulatory Visit (INDEPENDENT_AMBULATORY_CARE_PROVIDER_SITE_OTHER): Payer: No Typology Code available for payment source | Admitting: Podiatry

## 2017-10-06 ENCOUNTER — Encounter: Payer: Self-pay | Admitting: Podiatry

## 2017-10-06 DIAGNOSIS — B351 Tinea unguium: Secondary | ICD-10-CM | POA: Diagnosis not present

## 2017-10-06 DIAGNOSIS — M79674 Pain in right toe(s): Secondary | ICD-10-CM

## 2017-10-06 DIAGNOSIS — M79675 Pain in left toe(s): Secondary | ICD-10-CM | POA: Diagnosis not present

## 2017-10-06 DIAGNOSIS — D492 Neoplasm of unspecified behavior of bone, soft tissue, and skin: Secondary | ICD-10-CM

## 2017-10-09 NOTE — Progress Notes (Signed)
Subjective:   Patient ID: Travis [name], male   DOB: 57 y.o.   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  I did discuss MRI and he denies wanting this currently and we will watch this and if changes occur he will need to have pathological evaluation  X-rays were negative for signs of calcification lesion or other pathology

## 2017-10-13 ENCOUNTER — Encounter: Payer: No Typology Code available for payment source | Attending: Family Medicine | Admitting: Nutrition

## 2017-10-13 ENCOUNTER — Encounter: Payer: Self-pay | Admitting: "Endocrinology

## 2017-10-13 ENCOUNTER — Other Ambulatory Visit: Payer: Self-pay | Admitting: "Endocrinology

## 2017-10-13 ENCOUNTER — Encounter: Payer: Self-pay | Admitting: Nutrition

## 2017-10-13 ENCOUNTER — Ambulatory Visit (INDEPENDENT_AMBULATORY_CARE_PROVIDER_SITE_OTHER): Payer: No Typology Code available for payment source | Admitting: "Endocrinology

## 2017-10-13 VITALS — Ht 73.0 in | Wt 176.0 lb

## 2017-10-13 VITALS — BP 121/78 | HR 87 | Ht 73.0 in | Wt 173.0 lb

## 2017-10-13 DIAGNOSIS — E673 Hypervitaminosis D: Secondary | ICD-10-CM

## 2017-10-13 DIAGNOSIS — E1159 Type 2 diabetes mellitus with other circulatory complications: Secondary | ICD-10-CM

## 2017-10-13 DIAGNOSIS — IMO0002 Reserved for concepts with insufficient information to code with codable children: Secondary | ICD-10-CM

## 2017-10-13 DIAGNOSIS — E782 Mixed hyperlipidemia: Secondary | ICD-10-CM | POA: Diagnosis not present

## 2017-10-13 DIAGNOSIS — E118 Type 2 diabetes mellitus with unspecified complications: Principal | ICD-10-CM

## 2017-10-13 DIAGNOSIS — E1165 Type 2 diabetes mellitus with hyperglycemia: Secondary | ICD-10-CM

## 2017-10-13 NOTE — Progress Notes (Signed)
Endocrinology follow-up note       10/13/2017, 3:51 PM   Subjective:    Patient ID: Travis [name], male    DOB: 1960/09/01.  Travis [name] is being seen in consultation for management of currently uncontrolled symptomatic diabetes requested by  Kathyrn Drown, MD.   Past Medical History:  Diagnosis Date  . A-fib ([place]) 02/2016   found on loop recorder  . 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 Anxiety   . Arthritis   . Blood transfusion without reported diagnosis   . Chronic headaches   . Complication of anesthesia    bleed after last shoulder-had to stay overnight  . Depression   . Diabetes mellitus   . Diabetic peripheral neuropathy ([place]) 03/28/2013  . Diverticulosis   . Dizziness   . Hernia, inguinal   . Hyperlipidemia   . Hypertension   . Impingement syndrome of left shoulder 08/02/2014  . 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 Neuropathy of lower extremity   . Night sweats    every once in a while  . Sleep apnea    no CPAP  . Stroke ([place]) 02/2015  . Syncope and collapse    Past Surgical History:  Procedure Laterality Date  . CERVICAL DISC SURGERY    . COLON SURGERY  2003   1/3 removed for diverticulitis  . COLONOSCOPY    . EP IMPLANTABLE DEVICE N/A 05/01/2015   Procedure: Loop Recorder Insertion;  Surgeon: [name], MD;  Location: [facility];  Service: Cardiovascular;  Laterality: N/A;  . INGUINAL HERNIA REPAIR Bilateral   . KNEE ARTHROSCOPY Right   . SHOULDER ARTHROSCOPY Right    1  . SHOULDER ARTHROSCOPY Left 08/02/2014   Procedure: LEFT SHOULDER SCOPE DEBRIDEMENT/ACROMIOPLASTY;  Surgeon: [name], MD;  Location: [facility];  Service: Orthopedics;  Laterality: Left;  ANESTHESIA: GENERAL, PRE/POST OP SCALENE  . TEE WITHOUT CARDIOVERSION N/A 03/03/2015   Procedure: TRANSESOPHAGEAL ECHOCARDIOGRAM (TEE);  Surgeon: [name], MD;  Location: [facility];  Service: Cardiology;  Laterality: N/A;  . UMBILICAL HERNIA REPAIR     with other hernia repair with mesh  . WRIST SURGERY Right    fusion   Social History   Socioeconomic History  . Marital status: Married    Spouse name: Not on file  . Number of children: 2  . Years of education: College  . Highest education level: Not on file  Occupational History  . Occupation: disabled  Social Needs  . Financial resource strain: Not on file  . Food insecurity:    Worry: Not on file    Inability: Not on file  . Transportation needs:    Medical: Not on file    Non-medical: Not on file  Tobacco Use  . Smoking status: Former Smoker    Packs/day: 1.00    Years: 8.00    Pack years: 8.00    Types: Cigarettes    Start date: 06/07/1970    Last attempt to quit: 05/03/1978    Years since quitting: 39.4  . Smokeless tobacco: Former Systems developer    Types: Hooks date: 05/03/1978  Substance and Sexual  Activity  . Alcohol use: No    Alcohol/week: 0.6 oz    Types: 1 Cans of beer per week    Comment: h/o heavy use in the past  . Drug use: No  . Sexual activity: Not on file  Lifestyle  . Physical activity:    Days per week: Not on file    Minutes per session: Not on file  . Stress: Not on file  Relationships  . Social connections:    Talks on phone: Not on file    Gets together: Not on file    Attends religious service: Not on file    Active member of club or organization: Not on file    Attends meetings of clubs or organizations: Not on file    Relationship status: Not on file  Other Topics Concern  . Not on file  Social History Narrative   Drinks some coffee, Drink diet sodas and tea   Outpatient Encounter Medications as of 10/13/2017  Medication Sig  . ALPRAZolam (XANAX) 0.25 MG tablet TAKE 1 TABLET BY MOUTH TWICE DAILY AS NEEDED  . apixaban (ELIQUIS) 5 MG TABS tablet Take 1 tablet (5 mg total) by mouth 2 (two) times daily.  Marland Kitchen aspirin EC 81 MG tablet Take 1 tablet (81 mg  total) by mouth daily.  Marland Kitchen atorvastatin (LIPITOR) 20 MG tablet TAKE 1 TABLET BY MOUTH DAILY.  Marland Kitchen Blood Glucose Monitoring Suppl (BLOOD GLUCOSE MONITOR SYSTEM) w/Device KIT Test glucose 1 time daily.  . folic acid (FOLVITE) 1 MG tablet TAKE 1 TABLET BY MOUTH DAILY.  Marland Kitchen Insulin Detemir (LEVEMIR) 100 UNIT/ML Pen Inject 16 Units into the skin daily at 10 pm.   . LYRICA 100 MG capsule TAKE ONE CAPSULE BY MOUTH THREE TIMES DAILY  . meclizine (ANTIVERT) 25 MG tablet TAKE 1 TABLET BY MOUTH 3 TIMES DAILY AS NEEDED FOR DIZZINESS.  . metFORMIN (GLUCOPHAGE) 500 MG tablet Take 2 tablets by mouth twice daily  . Multiple Vitamin (MULTIVITAMIN WITH MINERALS) TABS tablet Take 1 tablet daily by mouth.  . traMADol (ULTRAM) 50 MG tablet TAKE 1 TABLET BY MOUTH THREE TIMES DAILY AS NEEDED FOR MODREATE PAIN  . valACYclovir (VALTREX) 1000 MG tablet TAKE 1 TABLET BY MOUTH ONCE DAILY  . [DISCONTINUED] amoxicillin (AMOXIL) 500 MG tablet Take 1 tablet (500 mg total) by mouth 3 (three) times daily.   No facility-administered encounter medications on file as of 10/13/2017.     ALLERGIES: Allergies  Allergen Reactions  . Lisinopril Cough    Patient/spouse is not aware/familiar with why this is listed as an allergy    VACCINATION STATUS: Immunization History  Administered Date(s) Administered  . Influenza Split 03/28/2013  . Influenza,inj,Quad PF,6+ Mos 02/18/2014, 01/23/2015, 02/06/2016, 02/23/2017  . Influenza-Unspecified 01/02/2011  . Pneumococcal Polysaccharide-23 01/31/2010, 03/01/2015  . Tdap 02/18/2014    Diabetes  He presents for his follow-up diabetic visit. He has type 2 diabetes mellitus. Onset time: He was diagnosed at approximate age of 54 years. His disease course has been worsening (He has prior history of heavy alcohol use/abuse.). There are no hypoglycemic associated symptoms. Pertinent negatives for hypoglycemia include no confusion, headaches, pallor or seizures. Pertinent negatives for diabetes  include no chest pain, no fatigue, no polydipsia, no polyphagia, no polyuria and no weakness. There are no hypoglycemic complications. Symptoms are improving. Diabetic complications include a CVA. Risk factors for coronary artery disease include dyslipidemia, diabetes mellitus, hypertension, family history, male sex and tobacco exposure. Current diabetic treatment includes insulin injections and  oral agent (monotherapy). He is compliant with treatment most of the time. His weight is stable. He is following a generally unhealthy diet. When asked about meal planning, he reported none. He has had a previous visit with a dietitian. He participates in exercise intermittently. His breakfast blood glucose range is generally 180-200 mg/dl. His overall blood glucose range is 180-200 mg/dl. An ACE inhibitor/angiotensin II receptor blocker is contraindicated.  Hyperlipidemia  This is a chronic problem. The current episode started more than 1 year ago. The problem is controlled. Recent lipid tests were reviewed and are normal. Exacerbating diseases include diabetes. Factors aggravating his hyperlipidemia include smoking. Pertinent negatives include no chest pain, myalgias or shortness of breath. Current antihyperlipidemic treatment includes statins. The current treatment provides moderate improvement of lipids. Risk factors for coronary artery disease include dyslipidemia, diabetes mellitus, hypertension, male sex, family history and a sedentary lifestyle.  Hypertension  This is a chronic problem. The current episode started more than 1 year ago. The problem is controlled. Pertinent negatives include no chest pain, headaches, neck pain, palpitations or shortness of breath. Risk factors for coronary artery disease include dyslipidemia, diabetes mellitus, family history, male gender, sedentary lifestyle and smoking/tobacco exposure. Hypertensive end-organ damage includes CVA.    Review of Systems  Constitutional: Negative  for chills, fatigue, fever and unexpected weight change.  HENT: Negative for dental problem, mouth sores and trouble swallowing.   Eyes: Negative for visual disturbance.  Respiratory: Negative for cough, choking, chest tightness, shortness of breath and wheezing.   Cardiovascular: Negative for chest pain, palpitations and leg swelling.  Gastrointestinal: Negative for abdominal distention, abdominal pain, constipation, diarrhea, nausea and vomiting.  Endocrine: Negative for polydipsia, polyphagia and polyuria.  Genitourinary: Negative for dysuria, flank pain, hematuria and urgency.  Musculoskeletal: Negative for back pain, gait problem, myalgias and neck pain.  Skin: Negative for pallor, rash and wound.  Neurological: Negative for seizures, syncope, weakness, numbness and headaches.  Psychiatric/Behavioral: Negative.  Negative for confusion and dysphoric mood.    Objective:    BP 121/78   Pulse 87   Ht '6\' 1"'  (1.854 m)   Wt 173 lb (78.5 kg)   BMI 22.82 kg/m   Wt Readings from Last 3 Encounters:  10/13/17 176 lb (79.8 kg)  10/13/17 173 lb (78.5 kg)  09/06/17 177 lb (80.3 kg)     Physical Exam  Constitutional: He is oriented to person, place, and time. He appears well-developed. He is cooperative. No distress.  HENT:  Head: Normocephalic and atraumatic.  Eyes: EOM are normal.  Neck: Normal range of motion. Neck supple. No tracheal deviation present. No thyromegaly present.  Cardiovascular: Normal rate, S1 normal, S2 normal and normal heart sounds. Exam reveals no gallop.  No murmur heard. Pulses:      Dorsalis pedis pulses are 1+ on the right side, and 1+ on the left side.       Posterior tibial pulses are 1+ on the right side, and 1+ on the left side.  Pulmonary/Chest: Breath sounds normal. No respiratory distress. He has no wheezes.  Abdominal: Soft. Bowel sounds are normal. He exhibits no distension. There is no tenderness. There is no guarding and no CVA tenderness.   Musculoskeletal: He exhibits no edema.       Right shoulder: He exhibits no swelling and no deformity.  Neurological: He is alert and oriented to person, place, and time. He has normal strength and normal reflexes. No cranial nerve deficit or sensory deficit. Gait normal.  Skin:  Skin is warm and dry. No rash noted. No cyanosis. Nails show no clubbing.  Psychiatric: He has a normal mood and affect. His speech is normal. Cognition and memory are normal.    CMP ( most recent) CMP     Component Value Date/Time   NA 142 07/12/2017 1008   K 4.3 07/12/2017 1008   CL 105 07/12/2017 1008   CO2 23 07/12/2017 1008   GLUCOSE 205 (H) 07/12/2017 1008   GLUCOSE 403 (H) 03/15/2017 1544   BUN 19 07/12/2017 1008   CREATININE 0.98 07/12/2017 1008   CREATININE 0.82 02/16/2014 1053   CALCIUM 9.3 07/12/2017 1008   PROT 6.8 07/12/2017 1008   ALBUMIN 4.3 07/12/2017 1008   AST 27 07/12/2017 1008   ALT 14 07/12/2017 1008   ALKPHOS 77 07/12/2017 1008   BILITOT 0.5 07/12/2017 1008   GFRNONAA 84 07/12/2017 1008   GFRAA 97 07/12/2017 1008     Diabetic Labs (most recent): Lab Results  Component Value Date   HGBA1C 7.6 (H) 07/12/2017   HGBA1C 7.6 07/12/2017   HGBA1C 14.6 (H) 03/12/2017     Lipid Panel ( most recent) Lipid Panel     Component Value Date/Time   CHOL 106 03/12/2017 1021   TRIG 189 (H) 03/12/2017 1021   HDL 28 (L) 03/12/2017 1021   CHOLHDL 3.8 03/12/2017 1021   CHOLHDL 3.2 05/03/2016 0545   VLDL 16 05/03/2016 0545   LDLCALC 40 03/12/2017 1021      Lab Results  Component Value Date   TSH 1.960 03/12/2017   TSH 1.460 10/07/2016   TSH 0.720 05/02/2016   TSH 1.895 10/14/2014   FREET4 1.21 10/07/2016      Assessment & Plan:   1. DM type 2 causing vascular disease ([place]) - Jackey Loge has currently uncontrolled symptomatic type 2 DM since  57 years of age. He came with no previsit labs.  He did not bring any logs, his meter average is 170-180 fasting for the last 30  days.  His last visit A1c was 7.6% improving from 14.6 %.  -He still admits to dietary indiscretion. -Last visit labs showed hypervitaminosis D.  He was taken off of all vitamin D supplements.    -his diabetes is complicated by CVA, history of smoking, history of heavy alcohol use/abuse in the past and FORNEY KLEINPETER remains at a high risk for more acute and chronic complications which include CAD, CVA, CKD, retinopathy, and neuropathy. These are all discussed in detail with the patient.  - I have counseled him on diet management  by adopting a carbohydrate restricted/protein rich diet.  -  Suggestion is made for him to avoid simple carbohydrates  from his diet including Cakes, Sweet Desserts / Pastries, Ice Cream, Soda (diet and regular), Sweet Tea, Candies, Chips, Cookies, Store Bought Juices, Alcohol in Excess of  1-2 drinks a day, Artificial Sweeteners, and "Sugar-free" Products. This will help patient to have stable blood glucose profile and potentially avoid unintended weight gain.  - I encouraged him to switch to  unprocessed or minimally processed complex starch and increased protein intake (animal or plant source), fruits, and vegetables.  - he is advised to stick to a routine mealtimes to eat 3 meals  a day and avoid unnecessary snacks ( to snack only to correct hypoglycemia).   - He is already following with Kieth Brightly crumpton, CDE.  - I have approached him with the following individualized plan to manage diabetes and patient agrees:   -  There could be a component of pancreatic endocrine and exocrine insufficiency given his prior history of heavy alcohol use, which would make it necessary for him to eventually use insulin basal/bolus to  control diabetes to target. -#1 priority in this patient is to avoid hypoglycemia.   -He is asked to go for labs today and return in 4 weeks for reevaluation. -In the meantime, I advised him to continue only with basal insulin Levemir 15 units daily at  bedtime, associated with strict monitoring of blood glucose 2 times a day before breakfast and at bedtime.  -Patient is encouraged to call clinic for blood glucose levels less than 70 or above 300 mg /dl. - I will continue metformin 500 mg by mouth twice a day with meals, therapeutically suitable for patient .  -Patient is not a candidate for SGLT2 inhibitors, nor incretin therapy due to his body habitus.  - Patient specific target  A1c;  LDL, HDL, Triglycerides, and  Waist Circumference were discussed in detail.  2) BP/HTN:  His records indicate intolerance to lisinopril. His blood pressure is controlled to target at this time was normal medications. 3) Lipids/HPL:   Controlled with LDL at 40.  He is advised to continue atorvastatin 20 mg p.o. Nightly.  4)  Weight/Diet: CDE Consult  is in progress , exercise, and detailed carbohydrates information provided.   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 He is advised to d/c all vitamin D supplements.  We will recheck vitamin D level today.  6) Chronic Care/Health Maintenance:  -he  is on Statin medications and  is encouraged to continue to follow up with Ophthalmology, Dentist,  Podiatrist at least yearly or according to recommendations, and advised to  stay away from smoking. I have recommended yearly flu vaccine and pneumonia vaccination at least every 5 years; moderate intensity exercise for up to 150 minutes weekly; and  sleep for at least 7 hours a day.  - I advised patient to maintain close follow up with Kathyrn Drown, MD for primary care needs.  - Time spent with the patient: 25 min, of which >50% was spent in reviewing his blood glucose logs , discussing his hypo- and hyper-glycemic episodes, reviewing his current and  previous labs and insulin doses and developing a plan to avoid hypo- and hyper-glycemia. Please refer to Patient Instructions for Blood Glucose Monitoring and Insulin/Medications Dosing Guide"  in media tab for additional  information. Jackey Loge participated in the discussions, expressed understanding, and voiced agreement with the above plans.  All questions were answered to his satisfaction. he is encouraged to contact clinic should he have any questions or concerns prior to his return visit.  Follow up plan: - Return in about 1 month (around 11/10/2017) for meter, and logs, labs today.  Glade Lloyd, MD [facility] [address] [place], [place] [address] Phone: [phone]  Fax: [phone]    10/13/2017, 3:51 PM  This note was partially dictated with voice recognition software. Similar sounding words can be transcribed inadequately or may not  be corrected upon review.

## 2017-10-13 NOTE — Progress Notes (Addendum)
  Medical Nutrition Therapy:  Appt start time: 1530   end time: 1600   Assessment:  Third VIsit:     Primary concerns today: Type 2 DM. Lives with his wife.  BS are avg 170 mg/dl last 7 days. 130-140's in am. Admits to enjoying debbie cakes at times. Eats PB sandwich sometimes for snacks. Drinking water. Cooks some but gets take out often.  Going to get labs done today. Last A1C  7.6% 3/19. Likes eating low carb veggies. No low blood sugars. Lost 4 lbs.   Sees Dr.  Dorris Fetch, Endocrinology. Metfformin 1500 mg  BID. Levemir 16 units a day.  Wt stable.  Preferred Learning Style:   Auditory  Hands on  Learning Readiness:   Ready  Change in progress   MEDICATIONS: See list   DIETARY INTAKE:  24-hr recall:  B ( AM): [place] sausage jimmy dean fajitia  Snk ( AM):  L ( PM): Steamed vegetables, chicken,  Snk ( PM): almonds  D ( PM): Toss salad, [place]  Snk ( PM): water, Beverages: diet sodas,  Usual physical activity: yard work.  Estimated energy needs: 2000  calories 225 g carbohydrates 150 g protein 56 g fat  Progress Towards Goal(s):  In progress.   Nutritional Diagnosis:  NB-1.1 Food and nutrition-related knowledge deficit As related to Diabetes Type 2.  As evidenced by A1C >14%.    Intervention:  Nutrition and Diabetes education provided on My Plate, CHO counting, meal planning, portion sizes, timing of meals, avoiding snacks between meals unless having a low blood sugar, target ranges for A1C and blood sugars, signs/symptoms and treatment of hyper/hypoglycemia, monitoring blood sugars, taking medications as prescribed, benefits of exercising 30 minutes per day and prevention of complications of DM. Keep up the great job! Avoid snacks between meals Increase lower carb vegetables. Make sure eating 3-4 carb choices per meal Keep drinking water.  Teaching Method Utilized:  Visual Auditory Hands on  Handouts given during visit include:  The Plate Method    Meal Plan Card  Diabetes Instructions.   Barriers to learning/adherence to lifestyle change: none  Demonstrated degree of understanding via:  Teach Back   Monitoring/Evaluation:  Dietary intake, exercise, meal planning,SBG, and body weight in 3 month(s).

## 2017-10-13 NOTE — Patient Instructions (Signed)
Keep up the great job! Avoid snacks between meals Increase lower carb vegetables. Make sure eating 3-4 carb choices per meal Keep drinking water.

## 2017-10-14 LAB — COMPREHENSIVE METABOLIC PANEL
ALT: 9 IU/L (ref 0–44)
AST: 17 IU/L (ref 0–40)
Albumin/Globulin Ratio: 1.8 (ref 1.2–2.2)
Albumin: 4.2 g/dL (ref 3.5–5.5)
Alkaline Phosphatase: 76 IU/L (ref 39–117)
BUN/Creatinine Ratio: 16 (ref 9–20)
BUN: 15 mg/dL (ref 6–24)
Bilirubin Total: 0.4 mg/dL (ref 0.0–1.2)
CALCIUM: 9.2 mg/dL (ref 8.7–10.2)
CO2: 22 mmol/L (ref 20–29)
CREATININE: 0.93 mg/dL (ref 0.76–1.27)
Chloride: 105 mmol/L (ref 96–106)
GFR calc Af Amer: 104 mL/min/{1.73_m2} (ref >59)
GFR, EST NON AFRICAN AMERICAN: 90 mL/min/{1.73_m2} (ref >59)
Globulin, Total: 2.4 g/dL (ref 1.5–4.5)
Glucose: 335 mg/dL — ABNORMAL HIGH (ref 65–99)
Potassium: 4.3 mmol/L (ref 3.5–5.2)
Sodium: 143 mmol/L (ref 134–144)
Total Protein: 6.6 g/dL (ref 6.0–8.5)

## 2017-10-14 LAB — VITAMIN D 25 HYDROXY (VIT D DEFICIENCY, FRACTURES): VIT D 25 HYDROXY: 46.1 ng/mL (ref 30.0–100.0)

## 2017-10-14 LAB — HGB A1C W/O EAG: Hgb A1c MFr Bld: 7.9 % — ABNORMAL HIGH (ref 4.8–5.6)

## 2017-10-14 LAB — SPECIMEN STATUS REPORT

## 2017-10-17 ENCOUNTER — Ambulatory Visit (INDEPENDENT_AMBULATORY_CARE_PROVIDER_SITE_OTHER): Payer: No Typology Code available for payment source | Admitting: Family Medicine

## 2017-10-17 ENCOUNTER — Encounter: Payer: Self-pay | Admitting: Family Medicine

## 2017-10-17 VITALS — BP 130/84 | Temp 98.2°F | Ht 73.0 in | Wt 176.2 lb

## 2017-10-17 DIAGNOSIS — B9689 Other specified bacterial agents as the cause of diseases classified elsewhere: Secondary | ICD-10-CM

## 2017-10-17 DIAGNOSIS — J019 Acute sinusitis, unspecified: Secondary | ICD-10-CM

## 2017-10-17 MED ORDER — AMOXICILLIN-POT CLAVULANATE 875-125 MG PO TABS: ORAL_TABLET | ORAL | 0 refills | Status: DC

## 2017-10-17 NOTE — Progress Notes (Signed)
   Subjective:    Patient ID: Travis [name], male    DOB: 1960-10-07, 57 y.o.   MRN: [id number]  Cough  This is a new problem. The current episode started in the past 7 days. Associated symptoms include headaches, nasal congestion, a sore throat and wheezing. Treatments tried: otc cold med.    Some hi expsoure to very irty environment on fri  Soon after started   Pos clearing of throat and dcogestion  Real bad headache   Ongoing cough and headache and shouder aching  Cong   Very tired  And achey  Not prod cough     Review of Systems  HENT: Positive for sore throat.   Respiratory: Positive for cough and wheezing.   Neurological: Positive for headaches.       Objective:   Physical Exam  Alert, mild malaise. Hydration good Vitals stable. frontal/ maxillary tenderness evident positive nasal congestion. pharynx normal neck supple  lungs clear/no crackles or wheezes. heart regular in rhythm       Assessment & Plan:  Impression rhinosinusitis likely post expsousure to envir irritants, discussed with patient. plan antibiotics prescribed. Questions answered. Symptomatic care discussed. warning signs discussed. WSL

## 2017-10-19 ENCOUNTER — Telehealth: Payer: Self-pay | Admitting: Cardiology

## 2017-10-19 ENCOUNTER — Telehealth: Payer: Self-pay | Admitting: Family Medicine

## 2017-10-19 LAB — CUP PACEART REMOTE DEVICE CHECK
Date Time Interrogation Session: 20190528204053
Implantable Pulse Generator Implant Date: 20161229

## 2017-10-19 NOTE — Telephone Encounter (Signed)
LMOVM requesting that pt send manual transmission b/c home monitor has not updated in at least 14 days.    

## 2017-10-19 NOTE — Telephone Encounter (Signed)
Travis [name] dropped off last year's letter that she needed to get help with daycare assistance since Juriel can't watch grandchild.  She just needs the exact same thing as last year just needs it dated for this year. Letter in your box.

## 2017-10-20 ENCOUNTER — Other Ambulatory Visit (HOSPITAL_COMMUNITY): Payer: Self-pay | Admitting: Nurse Practitioner

## 2017-10-20 ENCOUNTER — Encounter: Payer: No Typology Code available for payment source | Admitting: Family Medicine

## 2017-10-20 MED FILL — MECLIZINE 25 MG TABLET: 25 | 10 days supply | Qty: 30 | Fill #3

## 2017-10-20 MED FILL — metFORMIN HCL 500 MG TABS: 500 | 90 days supply | Qty: 180 | Fill #1

## 2017-10-20 MED FILL — FREESTYLE LANCETS: 90 days supply | Qty: 100 | Fill #1

## 2017-10-21 MED FILL — ELIQUIS 5 MG TABLET: 5 | 30 days supply | Qty: 60 | Fill #0

## 2017-10-21 MED FILL — ALPRAZolam 0.25 MG TABS: 0.25 | 30 days supply | Qty: 60 | Fill #4

## 2017-10-23 ENCOUNTER — Encounter: Payer: Self-pay | Admitting: Family Medicine

## 2017-10-23 NOTE — Telephone Encounter (Signed)
A letter was dictated as requested

## 2017-10-24 ENCOUNTER — Ambulatory Visit (INDEPENDENT_AMBULATORY_CARE_PROVIDER_SITE_OTHER): Payer: No Typology Code available for payment source | Admitting: Family Medicine

## 2017-10-24 ENCOUNTER — Encounter: Payer: Self-pay | Admitting: Family Medicine

## 2017-10-24 VITALS — BP 122/74 | Ht 73.0 in | Wt 177.0 lb

## 2017-10-24 DIAGNOSIS — Z13220 Encounter for screening for lipoid disorders: Secondary | ICD-10-CM | POA: Diagnosis not present

## 2017-10-24 DIAGNOSIS — Z0000 Encounter for general adult medical examination without abnormal findings: Secondary | ICD-10-CM

## 2017-10-24 DIAGNOSIS — Z1159 Encounter for screening for other viral diseases: Secondary | ICD-10-CM

## 2017-10-24 NOTE — Progress Notes (Signed)
   Subjective:    Patient ID: Travis [name], male    DOB: 1960-10-11, 57 y.o.   MRN: [id number]  HPI The patient comes in today for a wellness visit.    A review of their health history was completed.  A review of medications was also completed.  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   Additional concerns: Shoulder hurt all the time.  Sees Dr. Dorris Fetch last A1c was 10/13/2017 at 7.9  Review of Systems  Constitutional: Negative for activity change, appetite change and fever.  HENT: Negative for congestion and rhinorrhea.   Eyes: Negative for discharge.  Respiratory: Negative for cough and wheezing.   Cardiovascular: Negative for chest pain.  Gastrointestinal: Negative for abdominal pain, blood in stool and vomiting.  Genitourinary: Negative for difficulty urinating and frequency.  Musculoskeletal: Negative for neck pain.  Skin: Negative for rash.  Allergic/Immunologic: Negative for environmental allergies and food allergies.  Neurological: Negative for weakness and headaches.  Psychiatric/Behavioral: Negative for agitation.       Objective:   Physical Exam  Constitutional: He appears well-developed and well-nourished.  HENT:  Head: Normocephalic and atraumatic.  Right Ear: External ear normal.  Left Ear: External ear normal.  Nose: Nose normal.  Mouth/Throat: Oropharynx is clear and moist.  Eyes: Pupils are equal, round, and reactive to light. EOM are normal.  Neck: Normal range of motion. Neck supple. No thyromegaly present.  Cardiovascular: Normal rate, regular rhythm and normal heart sounds.  No murmur heard. Pulmonary/Chest: Effort normal and breath sounds normal. No respiratory distress. He has no wheezes.  Abdominal: Soft. Bowel sounds are  normal. He exhibits no distension and no mass. There is no tenderness.  Genitourinary: Penis normal.  Musculoskeletal: Normal range of motion. He exhibits no edema.  Lymphadenopathy:    He has no cervical adenopathy.  Neurological: He is alert. He exhibits normal muscle tone.  Skin: Skin is warm and dry. No erythema.  Psychiatric: He has a normal mood and affect. His behavior is normal. Judgment normal.    Prostate exam normal GU normal      Assessment & Plan:  Adult wellness-complete.wellness physical was conducted today. Importance of diet and exercise were discussed in detail.  In addition to this a discussion regarding safety was also covered. We also reviewed over immunizations and gave recommendations regarding current immunization needed for age.  In addition to this additional areas were also touched on including: Preventative health exams needed:  Colonoscopy 2027   Patient was advised yearly wellness exam  The patient sees endocrinology on a regular basis Tolerating his medications well currently

## 2017-10-27 ENCOUNTER — Other Ambulatory Visit: Payer: Self-pay | Admitting: Family Medicine

## 2017-10-27 MED FILL — UNIFINE PENTIPS 8MM 31G: 31G X 8 MM | 90 days supply | Qty: 100 | Fill #0

## 2017-10-31 ENCOUNTER — Encounter: Payer: No Typology Code available for payment source | Admitting: *Deleted

## 2017-11-02 ENCOUNTER — Telehealth: Payer: Self-pay | Admitting: Cardiology

## 2017-11-02 NOTE — Telephone Encounter (Signed)
Spoke w/ pt wife and requested that he send a manual transmission b/c his home monitor has not updated in at least 14 days.   

## 2017-11-09 ENCOUNTER — Encounter: Payer: Self-pay | Admitting: Cardiology

## 2017-11-10 ENCOUNTER — Ambulatory Visit: Payer: No Typology Code available for payment source | Admitting: "Endocrinology

## 2017-11-10 ENCOUNTER — Ambulatory Visit: Payer: No Typology Code available for payment source | Admitting: Nutrition

## 2017-11-10 ENCOUNTER — Ambulatory Visit: Payer: Self-pay | Admitting: Nutrition

## 2017-11-10 MED FILL — LEVEMIR FLEXTOUCH 100 UNITS: 100 | 22 days supply | Qty: 9 | Fill #3

## 2017-11-11 ENCOUNTER — Other Ambulatory Visit: Payer: Self-pay

## 2017-11-11 ENCOUNTER — Other Ambulatory Visit: Payer: Self-pay | Admitting: Family Medicine

## 2017-11-11 MED ORDER — INSULIN DETEMIR 100 UNIT/ML FLEXPEN: 16.0000 [IU] | PEN_INJECTOR | Freq: Every day | SUBCUTANEOUS | 2 refills | Status: DC

## 2017-11-11 MED FILL — MECLIZINE 25 MG TABLET: 25 | 10 days supply | Qty: 30 | Fill #0

## 2017-11-14 ENCOUNTER — Encounter: Payer: Self-pay | Admitting: "Endocrinology

## 2017-11-14 ENCOUNTER — Ambulatory Visit (INDEPENDENT_AMBULATORY_CARE_PROVIDER_SITE_OTHER): Payer: No Typology Code available for payment source | Admitting: "Endocrinology

## 2017-11-14 ENCOUNTER — Encounter: Payer: No Typology Code available for payment source | Attending: Family Medicine | Admitting: Nutrition

## 2017-11-14 ENCOUNTER — Encounter: Payer: Self-pay | Admitting: Nutrition

## 2017-11-14 VITALS — BP 119/87 | HR 92 | Ht 73.0 in | Wt 170.0 lb

## 2017-11-14 VITALS — Wt 170.0 lb

## 2017-11-14 DIAGNOSIS — E782 Mixed hyperlipidemia: Secondary | ICD-10-CM | POA: Diagnosis not present

## 2017-11-14 DIAGNOSIS — E1165 Type 2 diabetes mellitus with hyperglycemia: Secondary | ICD-10-CM

## 2017-11-14 DIAGNOSIS — E118 Type 2 diabetes mellitus with unspecified complications: Principal | ICD-10-CM

## 2017-11-14 DIAGNOSIS — E1159 Type 2 diabetes mellitus with other circulatory complications: Secondary | ICD-10-CM

## 2017-11-14 DIAGNOSIS — IMO0002 Reserved for concepts with insufficient information to code with codable children: Secondary | ICD-10-CM

## 2017-11-14 MED ORDER — INSULIN DETEMIR 100 UNIT/ML FLEXPEN: 20.0000 [IU] | PEN_INJECTOR | Freq: Every day | SUBCUTANEOUS | 2 refills | Status: DC

## 2017-11-14 NOTE — Patient Instructions (Signed)
Goals 1. Take Metformin after breakfast instead of before 2. Increase higher fiber foods 3. Drink more water 4. Avoid debbie cakes. No snacks. Don't eat past 7 pm

## 2017-11-14 NOTE — Progress Notes (Signed)
  Medical Nutrition Therapy:  Appt start time: 1530   end time: 1600   Assessment:  [place] visit.     Primary concerns today: Type 2 DM. Lives with his wife.  A1C 7.9%. Up from 7.6%. Has been eating more with family gatherings the last few weeks. admits to enjoying debbie cakes at times. Trying to eat more fruits and vegetables. Wants to ride a bike for exercise but can't. Drinking water. Cooks some but gets take out often.   Lost 7 lbs. Likes eating low carb veggies. No low blood sugars.   Sees Dr.  Dorris Fetch, Endocrinology. Metfformin 1500 mg  BID. Levemir increased to 20 units a day.   Preferred Learning Style:   Auditory  Hands on  Learning Readiness:   Ready  Change in progress   MEDICATIONS: See list   DIETARY INTAKE:  24-hr recall:  B ( AM): [place] sausage jimmy dean fajitia  Snk ( AM):  L ( PM): Steamed vegetables, chicken,  Snk ( PM): almonds  D ( PM): Toss salad, [place]  Snk ( PM): water, Beverages: diet sodas,  Usual physical activity: yard work.  Estimated energy needs: 2000  calories 225 g carbohydrates 150 g protein 56 g fat  Progress Towards Goal(s):  In progress.   Nutritional Diagnosis:  NB-1.1 Food and nutrition-related knowledge deficit As related to Diabetes Type 2.  As evidenced by A1C >14%.    Intervention:  Nutrition and Diabetes education provided on My Plate, CHO counting, meal planning, portion sizes, timing of meals, avoiding snacks between meals unless having a low blood sugar, target ranges for A1C and blood sugars, signs/symptoms and treatment of hyper/hypoglycemia, monitoring blood sugars, taking medications as prescribed, benefits of exercising 30 minutes per day and prevention of complications of DM.  Goals 1. Take Metformin after breakfast instead of before 2. Increase higher fiber foods 3. Drink more water 4. Avoid debbie cakes. No snacks. Don't eat past 7 pm   Teaching Method Utilized:  Visual Auditory Hands  on  Handouts given during visit include:  The Plate Method   Meal Plan Card  Diabetes Instructions.   Barriers to learning/adherence to lifestyle change: none  Demonstrated degree of understanding via:  Teach Back   Monitoring/Evaluation:  Dietary intake, exercise, meal planning,SBG, and body weight in 3 month(s).

## 2017-11-14 NOTE — Progress Notes (Signed)
Endocrinology follow-up note       11/14/2017, 5:09 PM   Subjective:    Patient ID: Travis [name], male    DOB: [birth year].  Travis [name] is being seen in consultation for management of currently uncontrolled symptomatic diabetes requested by  Kathyrn Drown, MD.   Past Medical History:  Diagnosis Date  . A-fib ([place]) 02/2016   found on loop recorder  . 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 Anxiety   . Arthritis   . Blood transfusion without reported diagnosis   . Chronic headaches   . Complication of anesthesia    bleed after last shoulder-had to stay overnight  . Depression   . Diabetes mellitus   . Diabetic peripheral neuropathy ([place]) 03/28/2013  . Diverticulosis   . Dizziness   . Hernia, inguinal   . Hyperlipidemia   . Hypertension   . Impingement syndrome of left shoulder 08/02/2014  . 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 Neuropathy of lower extremity   . Night sweats    every once in a while  . Sleep apnea    no CPAP  . Stroke ([place]) 02/2015  . Syncope and collapse    Past Surgical History:  Procedure Laterality Date  . CERVICAL DISC SURGERY    . COLON SURGERY  2003   1/3 removed for diverticulitis  . COLONOSCOPY    . EP IMPLANTABLE DEVICE N/A 05/01/2015   Procedure: Loop Recorder Insertion;  Surgeon: [name], MD;  Location: [facility];  Service: Cardiovascular;  Laterality: N/A;  . INGUINAL HERNIA REPAIR Bilateral   . KNEE ARTHROSCOPY Right   . SHOULDER ARTHROSCOPY Right    1  . SHOULDER ARTHROSCOPY Left 08/02/2014   Procedure: LEFT SHOULDER SCOPE DEBRIDEMENT/ACROMIOPLASTY;  Surgeon: [name], MD;  Location: [facility];  Service: Orthopedics;  Laterality: Left;  ANESTHESIA: GENERAL, PRE/POST OP SCALENE  . TEE WITHOUT CARDIOVERSION N/A 03/03/2015   Procedure: TRANSESOPHAGEAL ECHOCARDIOGRAM (TEE);  Surgeon: [name], MD;  Location: [facility];  Service: Cardiology;  Laterality: N/A;  . UMBILICAL HERNIA REPAIR     with other hernia repair with mesh  . WRIST SURGERY Right    fusion   Social History   Socioeconomic History  . Marital status: Married    Spouse name: Not on file  . Number of children: 2  . Years of education: College  . Highest education level: Not on file  Occupational History  . Occupation: disabled  Social Needs  . Financial resource strain: Not on file  . Food insecurity:    Worry: Not on file    Inability: Not on file  . Transportation needs:    Medical: Not on file    Non-medical: Not on file  Tobacco Use  . Smoking status: Former Smoker    Packs/day: 1.00    Years: 8.00    Pack years: 8.00    Types: Cigarettes    Start date: 06/07/1970    Last attempt to quit: 05/03/1978    Years since quitting: 39.5  . Smokeless tobacco: Former Systems developer    Types: [place] date: 05/03/1978  Substance and Sexual  Activity  . Alcohol use: No    Alcohol/week: 0.6 oz    Types: 1 Cans of beer per week    Comment: h/o heavy use in the past  . Drug use: No  . Sexual activity: Not on file  Lifestyle  . Physical activity:    Days per week: Not on file    Minutes per session: Not on file  . Stress: Not on file  Relationships  . Social connections:    Talks on phone: Not on file    Gets together: Not on file    Attends religious service: Not on file    Active member of club or organization: Not on file    Attends meetings of clubs or organizations: Not on file    Relationship status: Not on file  Other Topics Concern  . Not on file  Social History Narrative   Drinks some coffee, Drink diet sodas and tea   Outpatient Encounter Medications as of 11/14/2017  Medication Sig  . ALPRAZolam (XANAX) 0.25 MG tablet TAKE 1 TABLET BY MOUTH TWICE DAILY AS NEEDED  . apixaban (ELIQUIS) 5 MG TABS tablet Take 1 tablet (5 mg total) by mouth 2 (two) times daily.  Marland Kitchen aspirin EC 81 MG tablet Take 1 tablet (81 mg  total) by mouth daily.  Marland Kitchen atorvastatin (LIPITOR) 20 MG tablet TAKE 1 TABLET BY MOUTH DAILY.  Marland Kitchen Blood Glucose Monitoring Suppl (BLOOD GLUCOSE MONITOR SYSTEM) w/Device KIT Test glucose 1 time daily.  . folic acid (FOLVITE) 1 MG tablet TAKE 1 TABLET BY MOUTH DAILY.  Marland Kitchen Insulin Detemir (LEVEMIR) 100 UNIT/ML Pen Inject 20 Units into the skin at bedtime.  Marland Kitchen LYRICA 100 MG capsule TAKE ONE CAPSULE BY MOUTH THREE TIMES DAILY  . meclizine (ANTIVERT) 25 MG tablet TAKE 1 TABLET BY MOUTH 3 TIMES DAILY AS NEEDED FOR DIZZINESS.  . metFORMIN (GLUCOPHAGE) 500 MG tablet Take 2 tablets by mouth twice daily  . Multiple Vitamin (MULTIVITAMIN WITH MINERALS) TABS tablet Take 1 tablet daily by mouth.  . traMADol (ULTRAM) 50 MG tablet TAKE 1 TABLET BY MOUTH THREE TIMES DAILY AS NEEDED FOR MODREATE PAIN  . UNIFINE PENTIPS 31G X 8 MM MISC USE AS DIRECTED WITH LEVEMIR INJECTIONS DAILY  . valACYclovir (VALTREX) 1000 MG tablet TAKE 1 TABLET BY MOUTH ONCE DAILY  . [DISCONTINUED] ELIQUIS 5 MG TABS tablet TAKE 1 TABLET BY MOUTH TWICE DAILY  . [DISCONTINUED] Insulin Detemir (LEVEMIR) 100 UNIT/ML Pen Inject 16 Units into the skin daily at 10 pm.   No facility-administered encounter medications on file as of 11/14/2017.     ALLERGIES: Allergies  Allergen Reactions  . Lisinopril Cough    Patient/spouse is not aware/familiar with why this is listed as an allergy    VACCINATION STATUS: Immunization History  Administered Date(s) Administered  . Influenza Split 03/28/2013  . Influenza,inj,Quad PF,6+ Mos 02/18/2014, 01/23/2015, 02/06/2016, 02/23/2017  . Influenza-Unspecified 01/02/2011  . Pneumococcal Polysaccharide-23 01/31/2010, 03/01/2015  . Tdap 02/18/2014    Diabetes  He presents for his follow-up diabetic visit. He has type 2 diabetes mellitus. Onset time: He was diagnosed at approximate age of 55 years. His disease course has been worsening (He has prior history of heavy alcohol use/abuse.). There are no hypoglycemic  associated symptoms. Pertinent negatives for hypoglycemia include no confusion, headaches, pallor or seizures. Pertinent negatives for diabetes include no chest pain, no fatigue, no polydipsia, no polyphagia, no polyuria and no weakness. There are no hypoglycemic complications. Symptoms are worsening. Diabetic complications include  a CVA. Risk factors for coronary artery disease include dyslipidemia, diabetes mellitus, hypertension, family history, male sex and tobacco exposure. Current diabetic treatment includes insulin injections and oral agent (monotherapy). He is compliant with treatment most of the time. His weight is decreasing steadily. He is following a generally unhealthy diet. When asked about meal planning, he reported none. He has had a previous visit with a dietitian. He participates in exercise intermittently. His breakfast blood glucose range is generally >200 mg/dl. His overall blood glucose range is >200 mg/dl. An ACE inhibitor/angiotensin II receptor blocker is contraindicated.  Hyperlipidemia  This is a chronic problem. The current episode started more than 1 year ago. The problem is controlled. Recent lipid tests were reviewed and are normal. Exacerbating diseases include diabetes. Factors aggravating his hyperlipidemia include smoking. Pertinent negatives include no chest pain, myalgias or shortness of breath. Current antihyperlipidemic treatment includes statins. The current treatment provides moderate improvement of lipids. Risk factors for coronary artery disease include dyslipidemia, diabetes mellitus, hypertension, male sex, family history and a sedentary lifestyle.  Hypertension  This is a chronic problem. The current episode started more than 1 year ago. The problem is controlled. Pertinent negatives include no chest pain, headaches, neck pain, palpitations or shortness of breath. Risk factors for coronary artery disease include dyslipidemia, diabetes mellitus, family history, male  gender, sedentary lifestyle and smoking/tobacco exposure. Hypertensive end-organ damage includes CVA.    Review of Systems  Constitutional: Negative for chills, fatigue, fever and unexpected weight change.  HENT: Negative for dental problem, mouth sores and trouble swallowing.   Eyes: Negative for visual disturbance.  Respiratory: Negative for cough, choking, chest tightness, shortness of breath and wheezing.   Cardiovascular: Negative for chest pain, palpitations and leg swelling.  Gastrointestinal: Negative for abdominal distention, abdominal pain, constipation, diarrhea, nausea and vomiting.  Endocrine: Negative for polydipsia, polyphagia and polyuria.  Genitourinary: Negative for dysuria, flank pain, hematuria and urgency.  Musculoskeletal: Negative for back pain, gait problem, myalgias and neck pain.  Skin: Negative for pallor, rash and wound.  Neurological: Negative for seizures, syncope, weakness, numbness and headaches.  Psychiatric/Behavioral: Negative.  Negative for confusion and dysphoric mood.    Objective:    BP 119/87   Pulse 92   Ht '6\' 1"'  (1.854 m)   Wt 170 lb (77.1 kg)   BMI 22.43 kg/m   Wt Readings from Last 3 Encounters:  11/14/17 170 lb (77.1 kg)  11/14/17 170 lb (77.1 kg)  10/24/17 177 lb (80.3 kg)     Physical Exam  Constitutional: He is oriented to person, place, and time. He appears well-developed. He is cooperative. No distress.  HENT:  Head: Normocephalic and atraumatic.  Eyes: EOM are normal.  Neck: Normal range of motion. Neck supple. No tracheal deviation present. No thyromegaly present.  Cardiovascular: Normal rate, S1 normal, S2 normal and normal heart sounds. Exam reveals no gallop.  No murmur heard. Pulses:      Dorsalis pedis pulses are 1+ on the right side, and 1+ on the left side.       Posterior tibial pulses are 1+ on the right side, and 1+ on the left side.  Pulmonary/Chest: Breath sounds normal. No respiratory distress. He has no  wheezes.  Abdominal: Soft. Bowel sounds are normal. He exhibits no distension. There is no tenderness. There is no guarding and no CVA tenderness.  Musculoskeletal: He exhibits no edema.       Right shoulder: He exhibits no swelling and no deformity.  Neurological:  He is alert and oriented to person, place, and time. He has normal strength and normal reflexes. No cranial nerve deficit or sensory deficit. Gait normal.  Skin: Skin is warm and dry. No rash noted. No cyanosis. Nails show no clubbing.  Psychiatric: He has a normal mood and affect. His speech is normal. Cognition and memory are normal.    CMP ( most recent) CMP     Component Value Date/Time   NA 143 10/13/2017 1548   K 4.3 10/13/2017 1548   CL 105 10/13/2017 1548   CO2 22 10/13/2017 1548   GLUCOSE 335 (H) 10/13/2017 1548   GLUCOSE 403 (H) 03/15/2017 1544   BUN 15 10/13/2017 1548   CREATININE 0.93 10/13/2017 1548   CREATININE 0.82 02/16/2014 1053   CALCIUM 9.2 10/13/2017 1548   PROT 6.6 10/13/2017 1548   ALBUMIN 4.2 10/13/2017 1548   AST 17 10/13/2017 1548   ALT 9 10/13/2017 1548   ALKPHOS 76 10/13/2017 1548   BILITOT 0.4 10/13/2017 1548   GFRNONAA 90 10/13/2017 1548   GFRAA 104 10/13/2017 1548     Diabetic Labs (most recent): Lab Results  Component Value Date   HGBA1C 7.9 (H) 10/13/2017   HGBA1C 7.6 (H) 07/12/2017   HGBA1C 7.6 07/12/2017     Lipid Panel ( most recent) Lipid Panel     Component Value Date/Time   CHOL 106 03/12/2017 1021   TRIG 189 (H) 03/12/2017 1021   HDL 28 (L) 03/12/2017 1021   CHOLHDL 3.8 03/12/2017 1021   CHOLHDL 3.2 05/03/2016 0545   VLDL 16 05/03/2016 0545   LDLCALC 40 03/12/2017 1021      Lab Results  Component Value Date   TSH 1.960 03/12/2017   TSH 1.460 10/07/2016   TSH 0.720 05/02/2016   TSH 1.895 10/14/2014   FREET4 1.21 10/07/2016      Assessment & Plan:   1. DM type 2 causing vascular disease ([place]) - Travis [name] has currently uncontrolled symptomatic  type 2 DM since  57 years of age. He came with previsit labs showing A1c of 7.9%.  This is a general improvement from A1c of 14.6%.    -His meter shows average blood glucose still above 200 mg/dL. -He still admits to dietary indiscretion. -Last visit labs showed hypervitaminosis D.  He was taken off of all vitamin D supplements.    -his diabetes is complicated by CVA, history of smoking, history of heavy alcohol use/abuse in the past and Travis [name] remains at a high risk for more acute and chronic complications which include CAD, CVA, CKD, retinopathy, and neuropathy. These are all discussed in detail with the patient.  - I have counseled him on diet management  by adopting a carbohydrate restricted/protein rich diet.  -  Suggestion is made for him to avoid simple carbohydrates  from his diet including Cakes, Sweet Desserts / Pastries, Ice Cream, Soda (diet and regular), Sweet Tea, Candies, Chips, Cookies, Store Bought Juices, Alcohol in Excess of  1-2 drinks a day, Artificial Sweeteners, and "Sugar-free" Products. This will help patient to have stable blood glucose profile and potentially avoid unintended weight gain.   - I encouraged him to switch to  unprocessed or minimally processed complex starch and increased protein intake (animal or plant source), fruits, and vegetables.  - he is advised to stick to a routine mealtimes to eat 3 meals  a day and avoid unnecessary snacks ( to snack only to correct hypoglycemia).   - He is  already following with Kieth Brightly crumpton, CDE.  - I have approached him with the following individualized plan to manage diabetes and patient agrees:   - There could be a component of pancreatic endocrine and exocrine insufficiency given his prior history of heavy alcohol use, which would make it necessary for him to eventually use insulin basal/bolus to  control diabetes to target. -#1 priority in this patient is to avoid hypoglycemia.   -I have advised him to  increase his Levemir to 20 units at bedtime,  associated with strict monitoring of blood glucose 2 times a day before breakfast and at bedtime.  -Patient is encouraged to call clinic for blood glucose levels less than 70 or above 300 mg /dl. - I will continue metformin 500 mg by mouth twice a day with meals, therapeutically suitable for patient .  -Patient is not a candidate for SGLT2 inhibitors, nor incretin therapy due to his body habitus.  - Patient specific target  A1c;  LDL, HDL, Triglycerides, and  Waist Circumference were discussed in detail.  2) BP/HTN:  His records indicate intolerance to lisinopril. His blood pressure is controlled to target at this time was normal medications. 3) Lipids/HPL:   Controlled with LDL at 40.  He is advised to continue atorvastatin 20 mg p.o. Nightly.  4)  Weight/Diet: CDE Consult  is in progress , exercise, and detailed carbohydrates information provided.  He has lost 7 pounds since June 2019.  If he continues to have unintentional weight loss, he will be considered for Creon therapy.   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 He is advised to d/c all vitamin D supplements.  We will recheck vitamin D level today.  6) Chronic Care/Health Maintenance:  -he  is on Statin medications and  is encouraged to continue to follow up with Ophthalmology, Dentist,  Podiatrist at least yearly or according to recommendations, and advised to  stay away from smoking. I have recommended yearly flu vaccine and pneumonia vaccination at least every 5 years; moderate intensity exercise for up to 150 minutes weekly; and  sleep for at least 7 hours a day.  - I advised patient to maintain close follow up with Kathyrn Drown, MD for primary care needs.  - Time spent with the patient: 25 min, of which >50% was spent in reviewing his blood glucose logs , discussing his hypo- and hyper-glycemic episodes, reviewing his current and  previous labs and insulin doses and developing a plan to avoid  hypo- and hyper-glycemia. Please refer to Patient Instructions for Blood Glucose Monitoring and Insulin/Medications Dosing Guide"  in media tab for additional information. Travis [name] participated in the discussions, expressed understanding, and voiced agreement with the above plans.  All questions were answered to his satisfaction. he is encouraged to contact clinic should he have any questions or concerns prior to his return visit.  Follow up plan: - Return in about 2 months (around 01/23/2018) for meter, and logs.  Travis Lloyd, MD [facility] [address] [place], [place] [address] Phone: [phone]  Fax: [phone]    11/14/2017, 5:09 PM  This note was partially dictated with voice recognition software. Similar sounding words can be transcribed inadequately or may not  be corrected upon review.

## 2017-11-18 ENCOUNTER — Other Ambulatory Visit: Payer: Self-pay | Admitting: Family Medicine

## 2017-11-18 MED FILL — ELIQUIS 5 MG TABLET: 5 | 30 days supply | Qty: 60 | Fill #1

## 2017-11-18 MED FILL — ALPRAZolam 0.25 MG TABS: 0.25 | 30 days supply | Qty: 60 | Fill #0

## 2017-11-18 MED FILL — MECLIZINE 25 MG TABLET: 25 | 10 days supply | Qty: 30 | Fill #1

## 2017-11-18 NOTE — Telephone Encounter (Signed)
May refill x4 

## 2017-12-01 MED FILL — PREGABALIN 100 MG CAPS: 100 | 30 days supply | Qty: 90 | Fill #1

## 2017-12-02 LAB — FUNGUS CULTURE W SMEAR

## 2017-12-08 ENCOUNTER — Other Ambulatory Visit: Payer: Self-pay | Admitting: Family Medicine

## 2017-12-08 ENCOUNTER — Other Ambulatory Visit: Payer: Self-pay

## 2017-12-08 MED ORDER — ATORVASTATIN CALCIUM 20 MG PO TABS: 20.0000 mg | ORAL_TABLET | Freq: Every day | ORAL | 1 refills | Status: DC

## 2017-12-08 MED FILL — valACYclovir HCL 1 GM TABS: 1 | 30 days supply | Qty: 90 | Fill #4

## 2017-12-08 MED FILL — ATORVASTATIN CALCIUM 20 MG: 20 | 90 days supply | Qty: 90 | Fill #0

## 2017-12-08 NOTE — Telephone Encounter (Signed)
May have 2 refills on cholesterol medicine Please refuse metformin to have him get this through Dr. Dorris Fetch

## 2017-12-13 ENCOUNTER — Other Ambulatory Visit: Payer: Self-pay | Admitting: Family Medicine

## 2017-12-16 MED FILL — LEVEMIR FLEXTOUCH 100 UNITS: 100 | 19 days supply | Qty: 3 | Fill #0

## 2017-12-23 ENCOUNTER — Other Ambulatory Visit: Payer: Self-pay | Admitting: Family Medicine

## 2017-12-23 MED FILL — ALPRAZolam 0.25 MG TABS: 0.25 | 30 days supply | Qty: 60 | Fill #1

## 2017-12-23 MED FILL — ELIQUIS 5 MG TABLET: 5 | 30 days supply | Qty: 60 | Fill #2

## 2017-12-23 MED FILL — FOLIC ACID 1 MG TABS: 1 | 90 days supply | Qty: 90 | Fill #1

## 2017-12-27 ENCOUNTER — Other Ambulatory Visit: Payer: Self-pay

## 2017-12-27 MED ORDER — METFORMIN HCL 500 MG PO TABS: ORAL_TABLET | ORAL | 0 refills | Status: DC

## 2017-12-28 MED FILL — metFORMIN HCL 500 MG TABS: 500 | 30 days supply | Qty: 120 | Fill #0

## 2017-12-30 ENCOUNTER — Telehealth: Payer: Self-pay

## 2017-12-30 NOTE — Telephone Encounter (Signed)
LMOVM requesting that pt send manual transmission b/c home monitor has not updated in at least 14 days.   Spoke w/ wife and requested that he send a manual transmission b/c his home monitor has not updated in at least 14 days.

## 2018-01-04 ENCOUNTER — Ambulatory Visit (INDEPENDENT_AMBULATORY_CARE_PROVIDER_SITE_OTHER): Payer: No Typology Code available for payment source | Admitting: *Deleted

## 2018-01-04 ENCOUNTER — Encounter: Payer: Self-pay | Admitting: Cardiology

## 2018-01-04 DIAGNOSIS — I63443 Cerebral infarction due to embolism of bilateral cerebellar arteries: Secondary | ICD-10-CM

## 2018-01-05 NOTE — Progress Notes (Signed)
[facility] Summary Report / Loop Recorder 

## 2018-01-10 ENCOUNTER — Other Ambulatory Visit: Payer: Self-pay | Admitting: Family Medicine

## 2018-01-10 MED FILL — LEVEMIR FLEXTOUCH 100 UNITS: 100 | 90 days supply | Qty: 15 | Fill #1

## 2018-01-12 ENCOUNTER — Other Ambulatory Visit: Payer: Self-pay | Admitting: Family Medicine

## 2018-01-12 MED FILL — UNIFINE PENTIPS 8MM 31G: 31G X 8 MM | 90 days supply | Qty: 100 | Fill #0

## 2018-01-16 ENCOUNTER — Other Ambulatory Visit: Payer: Self-pay | Admitting: Family Medicine

## 2018-01-16 ENCOUNTER — Encounter: Payer: No Typology Code available for payment source | Attending: Family Medicine | Admitting: Nutrition

## 2018-01-16 ENCOUNTER — Ambulatory Visit: Payer: No Typology Code available for payment source | Admitting: "Endocrinology

## 2018-01-16 VITALS — Wt 170.8 lb

## 2018-01-16 DIAGNOSIS — E118 Type 2 diabetes mellitus with unspecified complications: Principal | ICD-10-CM

## 2018-01-16 DIAGNOSIS — E1165 Type 2 diabetes mellitus with hyperglycemia: Secondary | ICD-10-CM

## 2018-01-16 DIAGNOSIS — IMO0002 Reserved for concepts with insufficient information to code with codable children: Secondary | ICD-10-CM

## 2018-01-16 MED FILL — ELIQUIS 5 MG TABLET: 5 | 30 days supply | Qty: 60 | Fill #3

## 2018-01-16 MED FILL — valACYclovir HCL 1 GM TABS: 1 | 90 days supply | Qty: 90 | Fill #5

## 2018-01-16 NOTE — Telephone Encounter (Signed)
May have this +2 additional refills 

## 2018-01-16 NOTE — Progress Notes (Signed)
  Medical Nutrition Therapy:  Appt start time: 1530   end time: 1600   Assessment:  Travis [name] visit.     Primary concerns today: Type 2 DM. Lives with his wife. BS avg 7 days 201 mg/dl. Eating bluebird [employer].  Hasnt gotten new blood work yet. Last A1C A1C 7.9%. Up from 7.6%.   Trying to eat more fruits and vegetables. Drinking water.   BS Avg 200. Mg/dl. Has been eating later than 7 pm Hasnt been taking insulin til 2-3 in am occasionally due to falling asleep.  Likes to snack at night,  Likes eating low carb veggies. No low blood sugars.  Testing bloods igar 1-2 times per day . Sees Dr.  Dorris Fetch, Endocrinology. Metfformin 500 mg  BID. Levemir  20 units a day. Needs continued support to make changes. H/o CVA. No prpbles chewing, swallowing. Unsteady gait.  Preferred Learning Style:   Auditory  Hands on  Learning Readiness:   Ready  Change in progress   MEDICATIONS: See list   DIETARY INTAKE:  24-hr recall:  B ( AM): Turkeys sausage,  Snk ( AM):  L ( PM): chicken salad, fruit,  Water, or Snk ( PM): almonds  D ( PM): Toss salad, [place]  Snk ( PM): water, Beverages: diet sodas,  Usual physical activity: yard work.  Estimated energy needs: 2000  calories 225 g carbohydrates 150 g protein 56 g fat  Progress Towards Goal(s):  In progress.   Nutritional Diagnosis:  NB-1.1 Food and nutrition-related knowledge deficit As related to Diabetes Type 2.  As evidenced by A1C >14%.    Intervention:  Nutrition and Diabetes education provided on My Plate, CHO counting, meal planning, portion sizes, timing of meals, avoiding snacks between meals unless having a low blood sugar, target ranges for A1C and blood sugars, signs/symptoms and treatment of hyper/hypoglycemia, monitoring blood sugars, taking medications as prescribed, benefits of exercising 30 minutes per day and prevention of complications of DM.  Goals Get bloodwork Eat meals on times. Drink more water and  cut out diet sodas. Take insulin before 9 pm.. Get signed up with [facility]   Teaching Method Utilized:  Visual Auditory Hands on  Handouts given during visit include:  The Plate Method   Meal Plan Card  Diabetes Instructions.   Barriers to learning/adherence to lifestyle change: none  Demonstrated degree of understanding via:  Teach Back   Monitoring/Evaluation:  Dietary intake, exercise, meal planning,SBG, and body weight in 3 month(s).

## 2018-01-16 NOTE — Patient Instructions (Addendum)
Goals Get bloodwork Eat meals on times. Drink more water and cut out diet sodas. Take insulin before 9 pm.. Get signed up with [facility]

## 2018-01-17 MED FILL — PREGABALIN 100 MG CAPS: 100 | 30 days supply | Qty: 90 | Fill #0

## 2018-01-17 NOTE — Telephone Encounter (Signed)
May refill this as requested

## 2018-01-20 MED FILL — ALPRAZolam 0.25 MG TABS: 0.25 | 30 days supply | Qty: 60 | Fill #2

## 2018-01-24 ENCOUNTER — Other Ambulatory Visit: Payer: Self-pay | Admitting: "Endocrinology

## 2018-01-25 LAB — COMPREHENSIVE METABOLIC PANEL
ALT: 5 IU/L (ref 0–44)
AST: 15 IU/L (ref 0–40)
Albumin/Globulin Ratio: 1.6 (ref 1.2–2.2)
Albumin: 4.1 g/dL (ref 3.5–5.5)
Alkaline Phosphatase: 75 IU/L (ref 39–117)
BUN/Creatinine Ratio: 18 (ref 9–20)
BUN: 15 mg/dL (ref 6–24)
Bilirubin Total: 0.5 mg/dL (ref 0.0–1.2)
CALCIUM: 9.2 mg/dL (ref 8.7–10.2)
CO2: 23 mmol/L (ref 20–29)
CREATININE: 0.83 mg/dL (ref 0.76–1.27)
Chloride: 108 mmol/L — ABNORMAL HIGH (ref 96–106)
GFR calc Af Amer: 111 mL/min/{1.73_m2} (ref >59)
GFR, EST NON AFRICAN AMERICAN: 96 mL/min/{1.73_m2} (ref >59)
GLOBULIN, TOTAL: 2.5 g/dL (ref 1.5–4.5)
Glucose: 198 mg/dL — ABNORMAL HIGH (ref 65–99)
Potassium: 4.2 mmol/L (ref 3.5–5.2)
Sodium: 145 mmol/L — ABNORMAL HIGH (ref 134–144)
Total Protein: 6.6 g/dL (ref 6.0–8.5)

## 2018-01-25 LAB — LIPID PANEL
CHOL/HDL RATIO: 2.6 ratio (ref 0.0–5.0)
Cholesterol, Total: 102 mg/dL (ref 100–199)
HDL: 39 mg/dL — ABNORMAL LOW (ref >39)
LDL CALC: 55 mg/dL (ref 0–99)
TRIGLYCERIDES: 41 mg/dL (ref 0–149)
VLDL CHOLESTEROL CAL: 8 mg/dL (ref 5–40)

## 2018-01-25 LAB — HGB A1C W/O EAG: Hgb A1c MFr Bld: 8.6 % — ABNORMAL HIGH (ref 4.8–5.6)

## 2018-01-25 LAB — HEPATITIS C ANTIBODY: Hep C Virus Ab: <0.1 s/co ratio (ref 0.0–0.9)

## 2018-01-26 ENCOUNTER — Encounter: Payer: Self-pay | Admitting: "Endocrinology

## 2018-01-26 ENCOUNTER — Ambulatory Visit (INDEPENDENT_AMBULATORY_CARE_PROVIDER_SITE_OTHER): Payer: No Typology Code available for payment source | Admitting: "Endocrinology

## 2018-01-26 VITALS — BP 109/72 | HR 86 | Ht 73.0 in | Wt 171.0 lb

## 2018-01-26 DIAGNOSIS — E782 Mixed hyperlipidemia: Secondary | ICD-10-CM

## 2018-01-26 DIAGNOSIS — E673 Hypervitaminosis D: Secondary | ICD-10-CM

## 2018-01-26 DIAGNOSIS — E1159 Type 2 diabetes mellitus with other circulatory complications: Secondary | ICD-10-CM | POA: Diagnosis not present

## 2018-01-26 MED ORDER — INSULIN DETEMIR 100 UNIT/ML FLEXPEN: 30.0000 [IU] | PEN_INJECTOR | Freq: Every day | SUBCUTANEOUS | 2 refills | Status: DC

## 2018-01-26 NOTE — Progress Notes (Signed)
Endocrinology follow-up note       01/26/2018, 5:19 PM   Subjective:    Patient ID: Travis [name], male    DOB: [birth year].  Travis [name] is being seen in consultation for management of currently uncontrolled symptomatic diabetes requested by  Kathyrn Drown, MD.   Past Medical History:  Diagnosis Date  . A-fib ([place]) 02/2016   found on loop recorder  . 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 Anxiety   . Arthritis   . Blood transfusion without reported diagnosis   . Chronic headaches   . Complication of anesthesia    bleed after last shoulder-had to stay overnight  . Depression   . Diabetes mellitus   . Diabetic peripheral neuropathy (Travis [name]) 03/28/2013  . Diverticulosis   . Dizziness   . Hernia, inguinal   . Hyperlipidemia   . Hypertension   . Impingement syndrome of left shoulder 08/02/2014  . 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 Neuropathy of lower extremity   . Night sweats    every once in a while  . Sleep apnea    no CPAP  . Stroke ([place]) 02/2015  . Syncope and collapse    Past Surgical History:  Procedure Laterality Date  . CERVICAL DISC SURGERY    . COLON SURGERY  2003   1/3 removed for diverticulitis  . COLONOSCOPY    . EP IMPLANTABLE DEVICE N/A 05/01/2015   Procedure: Loop Recorder Insertion;  Surgeon: [name], MD;  Location: [facility];  Service: Cardiovascular;  Laterality: N/A;  . INGUINAL HERNIA REPAIR Bilateral   . KNEE ARTHROSCOPY Right   . SHOULDER ARTHROSCOPY Right    1  . SHOULDER ARTHROSCOPY Left 08/02/2014   Procedure: LEFT SHOULDER SCOPE DEBRIDEMENT/ACROMIOPLASTY;  Surgeon: [name], MD;  Location: [facility];  Service: Orthopedics;  Laterality: Left;  ANESTHESIA: GENERAL, PRE/POST OP SCALENE  . TEE WITHOUT CARDIOVERSION N/A 03/03/2015   Procedure: TRANSESOPHAGEAL ECHOCARDIOGRAM (TEE);  Surgeon: [name], MD;  Location: [facility];  Service: Cardiology;  Laterality: N/A;  . UMBILICAL HERNIA REPAIR     with other hernia repair with mesh  . WRIST SURGERY Right    fusion   Social History   Socioeconomic History  . Marital status: Married    Spouse name: Not on file  . Number of children: 2  . Years of education: College  . Highest education level: Not on file  Occupational History  . Occupation: disabled  Social Needs  . Financial resource strain: Not on file  . Food insecurity:    Worry: Not on file    Inability: Not on file  . Transportation needs:    Medical: Not on file    Non-medical: Not on file  Tobacco Use  . Smoking status: Former Smoker    Packs/day: 1.00    Years: 8.00    Pack years: 8.00    Types: Cigarettes    Start date: 06/07/1970    Last attempt to quit: 05/03/1978    Years since quitting: 39.7  . Smokeless tobacco: Former Systems developer    Types: [place] date: 05/03/1978  Substance and Sexual  Activity  . Alcohol use: No    Alcohol/week: 1.0 standard drinks    Types: 1 Cans of beer per week    Comment: h/o heavy use in the past  . Drug use: No  . Sexual activity: Not on file  Lifestyle  . Physical activity:    Days per week: Not on file    Minutes per session: Not on file  . Stress: Not on file  Relationships  . Social connections:    Talks on phone: Not on file    Gets together: Not on file    Attends religious service: Not on file    Active member of club or organization: Not on file    Attends meetings of clubs or organizations: Not on file    Relationship status: Not on file  Other Topics Concern  . Not on file  Social History Narrative   Drinks some coffee, Drink diet sodas and tea   Outpatient Encounter Medications as of 01/26/2018  Medication Sig  . ALPRAZolam (XANAX) 0.25 MG tablet TAKE 1 TABLET BY MOUTH TWICE DAILY AS NEEDED  . apixaban (ELIQUIS) 5 MG TABS tablet Take 1 tablet (5 mg total) by mouth 2 (two) times daily.  Marland Kitchen aspirin EC 81 MG tablet Take 1  tablet (81 mg total) by mouth daily.  Marland Kitchen atorvastatin (LIPITOR) 20 MG tablet Take 1 tablet (20 mg total) by mouth daily.  . Blood Glucose Monitoring Suppl (BLOOD GLUCOSE MONITOR SYSTEM) w/Device KIT Test glucose 1 time daily.  . folic acid (FOLVITE) 1 MG tablet TAKE 1 TABLET BY MOUTH DAILY.  Marland Kitchen Insulin Detemir (LEVEMIR) 100 UNIT/ML Pen Inject 30 Units into the skin at bedtime.  . meclizine (ANTIVERT) 25 MG tablet TAKE 1 TABLET BY MOUTH 3 TIMES DAILY AS NEEDED FOR DIZZINESS.  . metFORMIN (GLUCOPHAGE) 500 MG tablet Take 2 tablets by mouth twice daily  . Multiple Vitamin (MULTIVITAMIN WITH MINERALS) TABS tablet Take 1 tablet daily by mouth.  . pregabalin (LYRICA) 100 MG capsule TAKE 1 CAPSULE BY MOUTH 3 TIMES DAILY  . traMADol (ULTRAM) 50 MG tablet TAKE 1 TABLET BY MOUTH THREE TIMES DAILY AS NEEDED FOR MODREATE PAIN  . UNIFINE PENTIPS 31G X 8 MM MISC USE AS DIRECTED WITH LEVEMIR INJECTIONS DAILY  . valACYclovir (VALTREX) 1000 MG tablet TAKE 1 TABLET BY MOUTH ONCE DAILY  . [DISCONTINUED] Insulin Detemir (LEVEMIR) 100 UNIT/ML Pen Inject 20 Units into the skin at bedtime.   No facility-administered encounter medications on file as of 01/26/2018.     ALLERGIES: Allergies  Allergen Reactions  . Lisinopril Cough    Patient/spouse is not aware/familiar with why this is listed as an allergy    VACCINATION STATUS: Immunization History  Administered Date(s) Administered  . Influenza Split 03/28/2013  . Influenza,inj,Quad PF,6+ Mos 02/18/2014, 01/23/2015, 02/06/2016, 02/23/2017  . Influenza-Unspecified 01/02/2011  . Pneumococcal Polysaccharide-23 01/31/2010, 03/01/2015  . Tdap 02/18/2014    Diabetes  He presents for his follow-up diabetic visit. He has type 2 diabetes mellitus. Onset time: He was diagnosed at approximate age of 21 years. His disease course has been worsening (He has prior history of heavy alcohol use/abuse.). There are no hypoglycemic associated symptoms. Pertinent negatives for  hypoglycemia include no confusion, headaches, pallor or seizures. Pertinent negatives for diabetes include no chest pain, no fatigue, no polydipsia, no polyphagia, no polyuria and no weakness. There are no hypoglycemic complications. Symptoms are worsening. Diabetic complications include a CVA. Risk factors for coronary artery disease include dyslipidemia, diabetes mellitus,  hypertension, family history, male sex and tobacco exposure. Current diabetic treatment includes insulin injections and oral agent (monotherapy). He is compliant with treatment most of the time. His weight is stable. He is following a generally unhealthy diet. When asked about meal planning, he reported none. He has had a previous visit with a dietitian. He participates in exercise intermittently. His breakfast blood glucose range is generally >200 mg/dl. His overall blood glucose range is >200 mg/dl. An ACE inhibitor/angiotensin II receptor blocker is contraindicated.  Hyperlipidemia  This is a chronic problem. The current episode started more than 1 year ago. The problem is controlled. Recent lipid tests were reviewed and are normal. Exacerbating diseases include diabetes. Factors aggravating his hyperlipidemia include smoking. Pertinent negatives include no chest pain, myalgias or shortness of breath. Current antihyperlipidemic treatment includes statins. The current treatment provides moderate improvement of lipids. Risk factors for coronary artery disease include dyslipidemia, diabetes mellitus, hypertension, male sex, family history and a sedentary lifestyle.  Hypertension  This is a chronic problem. The current episode started more than 1 year ago. The problem is controlled. Pertinent negatives include no chest pain, headaches, neck pain, palpitations or shortness of breath. Risk factors for coronary artery disease include dyslipidemia, diabetes mellitus, family history, male gender, sedentary lifestyle and smoking/tobacco exposure.  Hypertensive end-organ damage includes CVA.    Review of Systems  Constitutional: Negative for chills, fatigue, fever and unexpected weight change.  HENT: Negative for dental problem, mouth sores and trouble swallowing.   Eyes: Negative for visual disturbance.  Respiratory: Negative for cough, choking, chest tightness, shortness of breath and wheezing.   Cardiovascular: Negative for chest pain, palpitations and leg swelling.  Gastrointestinal: Negative for abdominal distention, abdominal pain, constipation, diarrhea, nausea and vomiting.  Endocrine: Negative for polydipsia, polyphagia and polyuria.  Genitourinary: Negative for dysuria, flank pain, hematuria and urgency.  Musculoskeletal: Negative for back pain, gait problem, myalgias and neck pain.  Skin: Negative for pallor, rash and wound.  Neurological: Negative for seizures, syncope, weakness, numbness and headaches.  Psychiatric/Behavioral: Negative.  Negative for confusion and dysphoric mood.    Objective:    BP 109/72   Pulse 86   Ht '6\' 1"'$  (1.854 m)   Wt 171 lb (77.6 kg)   BMI 22.56 kg/m   Wt Readings from Last 3 Encounters:  01/26/18 171 lb (77.6 kg)  01/16/18 170 lb 12.8 oz (77.5 kg)  11/14/17 170 lb (77.1 kg)     Physical Exam  Constitutional: He is oriented to person, place, and time. He appears well-developed. He is cooperative. No distress.  HENT:  Head: Normocephalic and atraumatic.  Eyes: EOM are normal.  Neck: Normal range of motion. Neck supple. No tracheal deviation present. No thyromegaly present.  Cardiovascular: Normal rate, S1 normal, S2 normal and normal heart sounds. Exam reveals no gallop.  No murmur heard. Pulses:      Dorsalis pedis pulses are 1+ on the right side, and 1+ on the left side.       Posterior tibial pulses are 1+ on the right side, and 1+ on the left side.  Pulmonary/Chest: Breath sounds normal. No respiratory distress. He has no wheezes.  Abdominal: Soft. Bowel sounds are normal. He  exhibits no distension. There is no tenderness. There is no guarding and no CVA tenderness.  Musculoskeletal: He exhibits no edema.       Right shoulder: He exhibits no swelling and no deformity.  Neurological: He is alert and oriented to person, place, and time. He  has normal strength and normal reflexes. No cranial nerve deficit or sensory deficit. Gait normal.  Skin: Skin is warm and dry. No rash noted. No cyanosis. Nails show no clubbing.  Psychiatric: He has a normal mood and affect. His speech is normal. Cognition and memory are normal.    CMP ( most recent) CMP     Component Value Date/Time   NA 145 (H) 01/24/2018 0819   K 4.2 01/24/2018 0819   CL 108 (H) 01/24/2018 0819   CO2 23 01/24/2018 0819   GLUCOSE 198 (H) 01/24/2018 0819   GLUCOSE 403 (H) 03/15/2017 1544   BUN 15 01/24/2018 0819   CREATININE 0.83 01/24/2018 0819   CREATININE 0.82 02/16/2014 1053   CALCIUM 9.2 01/24/2018 0819   PROT 6.6 01/24/2018 0819   ALBUMIN 4.1 01/24/2018 0819   AST 15 01/24/2018 0819   ALT 5 01/24/2018 0819   ALKPHOS 75 01/24/2018 0819   BILITOT 0.5 01/24/2018 0819   GFRNONAA 96 01/24/2018 0819   GFRAA 111 01/24/2018 0819     Diabetic Labs (most recent): Lab Results  Component Value Date   HGBA1C 8.6 (H) 01/24/2018   HGBA1C 7.9 (H) 10/13/2017   HGBA1C 7.6 (H) 07/12/2017     Lipid Panel ( most recent) Lipid Panel     Component Value Date/Time   CHOL 102 01/24/2018 0818   TRIG 41 01/24/2018 0818   HDL 39 (L) 01/24/2018 0818   CHOLHDL 2.6 01/24/2018 0818   CHOLHDL 3.2 05/03/2016 0545   VLDL 16 05/03/2016 0545   LDLCALC 55 01/24/2018 0818      Lab Results  Component Value Date   TSH 1.960 03/12/2017   TSH 1.460 10/07/2016   TSH 0.720 05/02/2016   TSH 1.895 10/14/2014   FREET4 1.21 10/07/2016      Assessment & Plan:   1. DM type 2 causing vascular disease ([place]) - Travis [name] has currently uncontrolled symptomatic type 2 DM since  57 years of age. He came with  increased A1c of 8.6%, average blood glucose of 194 since last visit.  Overall improvement of A1c from 14.6%.   -He still admits to dietary indiscretion.  -his diabetes is complicated by CVA, history of smoking, history of heavy alcohol use/abuse in the past and Travis [name] remains at a high risk for more acute and chronic complications which include CAD, CVA, CKD, retinopathy, and neuropathy. These are all discussed in detail with the patient.  - I have counseled him on diet management  by adopting a carbohydrate restricted/protein rich diet. -He still admits to dietary indiscretion including consumption of sweetened beverages.  -  Suggestion is made for him to avoid simple carbohydrates  from his diet including Cakes, Sweet Desserts / Pastries, Ice Cream, Soda (diet and regular), Sweet Tea, Candies, Chips, Cookies, Store Bought Juices, Alcohol in Excess of  1-2 drinks a day, Artificial Sweeteners, and "Sugar-free" Products. This will help patient to have stable blood glucose profile and potentially avoid unintended weight gain.  - I encouraged him to switch to  unprocessed or minimally processed complex starch and increased protein intake (animal or plant source), fruits, and vegetables.  - he is advised to stick to a routine mealtimes to eat 3 meals  a day and avoid unnecessary snacks ( to snack only to correct hypoglycemia).   - He is already following with Travis [name], Travis [name].  - I have approached him with the following individualized plan to manage diabetes and patient agrees:   -  There could be a component of pancreatic endocrine and exocrine insufficiency given his prior history of heavy alcohol use, which would make it necessary for him to eventually use insulin basal/bolus to  control diabetes to target. -#1 priority in this patient is to avoid hypoglycemia.   -He is advised to increase Levemir to 30 units at bedtime,  associated with strict monitoring of blood glucose 2 times a  day before breakfast and at bedtime.  -Patient is encouraged to call clinic for blood glucose levels less than 70 or above 300 mg /dl. - I will continue metformin 500 mg by mouth twice a day with meals, therapeutically suitable for patient .  -Patient is not a candidate for SGLT2 inhibitors, nor incretin therapy due to his body habitus.  - Patient specific target  A1c;  LDL, HDL, Triglycerides, and  Waist Circumference were discussed in detail.  2) BP/HTN:  His records indicate intolerance to lisinopril. His blood pressure is controlled to target at this time. 3) Lipids/HPL: His recent lipid panel showed controlled  LDL at 40.  He is advised to continue atorvastatin 20 mg p.o. nightly.    4)  Weight/Diet: Travis [name] Consult  is in progress , exercise, and detailed carbohydrates information provided.  He has lost 7 pounds since June 2019.  If he continues to have unintentional weight loss, he will be considered for Creon therapy.   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 He is advised to d/c all vitamin D supplements.  We will recheck vitamin D for his next visit.  6) Chronic Care/Health Maintenance:  -he  is on Statin medications and  is encouraged to continue to follow up with Ophthalmology, Dentist,  Podiatrist at least yearly or according to recommendations, and advised to  stay away from smoking. I have recommended yearly flu vaccine and pneumonia vaccination at least every 5 years; moderate intensity exercise for up to 150 minutes weekly; and  sleep for at least 7 hours a day.  - I advised patient to maintain close follow up with Kathyrn Drown, MD for primary care needs.  - Time spent with the patient: 25 min, of which >50% was spent in reviewing his blood glucose logs , discussing his hypo- and hyper-glycemic episodes, reviewing his current and  previous labs and insulin doses and developing a plan to avoid hypo- and hyper-glycemia. Please refer to Patient Instructions for Blood Glucose Monitoring and  Insulin/Medications Dosing Guide"  in media tab for additional information. Travis [name] participated in the discussions, expressed understanding, and voiced agreement with the above plans.  All questions were answered to his satisfaction. he is encouraged to contact clinic should he have any questions or concerns prior to his return visit.  Follow up plan: - Return in about 3 months (around 04/27/2018) for Follow up with Pre-visit Labs, Meter, and Logs.  Travis Lloyd, MD [facility] [address] [place], [place] [address] Phone: [phone]  Fax: [phone]    01/26/2018, 5:19 PM  This note was partially dictated with voice recognition software. Similar sounding words can be transcribed inadequately or may not  be corrected upon review.

## 2018-01-27 LAB — CUP PACEART REMOTE DEVICE CHECK
Date Time Interrogation Session: 20190904220745
MDC IDC PG IMPLANT DT: 20161229

## 2018-02-06 ENCOUNTER — Ambulatory Visit (INDEPENDENT_AMBULATORY_CARE_PROVIDER_SITE_OTHER): Payer: No Typology Code available for payment source | Admitting: *Deleted

## 2018-02-06 DIAGNOSIS — I63443 Cerebral infarction due to embolism of bilateral cerebellar arteries: Secondary | ICD-10-CM | POA: Diagnosis not present

## 2018-02-08 NOTE — Progress Notes (Signed)
[facility] Summary Report / Loop Recorder 

## 2018-02-09 ENCOUNTER — Ambulatory Visit: Payer: No Typology Code available for payment source

## 2018-02-20 ENCOUNTER — Other Ambulatory Visit: Payer: Self-pay

## 2018-02-20 LAB — CUP PACEART REMOTE DEVICE CHECK
Date Time Interrogation Session: 20191007223943
MDC IDC PG IMPLANT DT: 20161229

## 2018-02-20 MED ORDER — INSULIN DETEMIR 100 UNIT/ML FLEXPEN: 30.0000 [IU] | PEN_INJECTOR | Freq: Every day | SUBCUTANEOUS | 2 refills | Status: DC

## 2018-02-20 MED FILL — metFORMIN HCL 500 MG TABS: 500 | 15 days supply | Qty: 60 | Fill #1

## 2018-02-20 MED FILL — LEVEMIR FLEXTOUCH 100 UNITS: 100 | 50 days supply | Qty: 15 | Fill #0

## 2018-02-20 MED FILL — PREGABALIN 100 MG CAPS: 100 | 30 days supply | Qty: 90 | Fill #1

## 2018-02-20 MED FILL — FREESTYLE LANCETS: 90 days supply | Qty: 100 | Fill #2

## 2018-02-20 MED FILL — ELIQUIS 5 MG TABLET: 5 | 30 days supply | Qty: 60 | Fill #4

## 2018-02-20 MED FILL — ALPRAZolam 0.25 MG TABS: 0.25 | 30 days supply | Qty: 60 | Fill #3

## 2018-02-23 MED FILL — MECLIZINE 25 MG TABLET: 25 | 10 days supply | Qty: 30 | Fill #2

## 2018-02-24 ENCOUNTER — Ambulatory Visit (INDEPENDENT_AMBULATORY_CARE_PROVIDER_SITE_OTHER): Payer: No Typology Code available for payment source

## 2018-02-24 DIAGNOSIS — Z23 Encounter for immunization: Secondary | ICD-10-CM

## 2018-03-10 MED FILL — ATORVASTATIN CALCIUM 20 MG: 20 | 90 days supply | Qty: 90 | Fill #1

## 2018-03-13 ENCOUNTER — Ambulatory Visit (INDEPENDENT_AMBULATORY_CARE_PROVIDER_SITE_OTHER): Payer: No Typology Code available for payment source | Admitting: *Deleted

## 2018-03-13 DIAGNOSIS — I63443 Cerebral infarction due to embolism of bilateral cerebellar arteries: Secondary | ICD-10-CM | POA: Diagnosis not present

## 2018-03-13 NOTE — Progress Notes (Signed)
[facility] Summary Report / Loop Recorder 

## 2018-03-20 MED FILL — ALPRAZolam 0.25 MG TABS: 0.25 | 30 days supply | Qty: 60 | Fill #4

## 2018-03-27 ENCOUNTER — Other Ambulatory Visit: Payer: Self-pay | Admitting: Family Medicine

## 2018-03-27 ENCOUNTER — Other Ambulatory Visit: Payer: Self-pay | Admitting: "Endocrinology

## 2018-03-27 MED FILL — FOLIC ACID 1 MG TABS: 1 | 90 days supply | Qty: 90 | Fill #0

## 2018-03-27 MED FILL — ELIQUIS 5 MG TABLET: 5 | 30 days supply | Qty: 60 | Fill #5

## 2018-03-28 MED FILL — metFORMIN HCL 500 MG TABS: 500 | 90 days supply | Qty: 360 | Fill #0

## 2018-04-04 ENCOUNTER — Other Ambulatory Visit: Payer: Self-pay | Admitting: Family Medicine

## 2018-04-04 NOTE — Telephone Encounter (Signed)
May have 6 months refill 

## 2018-04-06 MED FILL — PREGABALIN 100 MG CAPS: 100 | 30 days supply | Qty: 90 | Fill #0

## 2018-04-13 ENCOUNTER — Ambulatory Visit (INDEPENDENT_AMBULATORY_CARE_PROVIDER_SITE_OTHER): Payer: No Typology Code available for payment source

## 2018-04-13 DIAGNOSIS — I63443 Cerebral infarction due to embolism of bilateral cerebellar arteries: Secondary | ICD-10-CM

## 2018-04-14 NOTE — Progress Notes (Signed)
[facility] Summary Report / Loop Recorder 

## 2018-04-18 ENCOUNTER — Encounter: Payer: Self-pay | Admitting: Family Medicine

## 2018-04-18 ENCOUNTER — Ambulatory Visit (INDEPENDENT_AMBULATORY_CARE_PROVIDER_SITE_OTHER): Payer: No Typology Code available for payment source | Admitting: Family Medicine

## 2018-04-18 ENCOUNTER — Telehealth: Payer: Self-pay | Admitting: Cardiology

## 2018-04-18 ENCOUNTER — Other Ambulatory Visit: Payer: Self-pay | Admitting: Family Medicine

## 2018-04-18 ENCOUNTER — Other Ambulatory Visit: Payer: Self-pay

## 2018-04-18 VITALS — BP 118/76 | Ht 73.0 in | Wt 177.0 lb

## 2018-04-18 DIAGNOSIS — E7849 Other hyperlipidemia: Secondary | ICD-10-CM

## 2018-04-18 DIAGNOSIS — M25511 Pain in right shoulder: Secondary | ICD-10-CM | POA: Diagnosis not present

## 2018-04-18 DIAGNOSIS — G5692 Unspecified mononeuropathy of left upper limb: Secondary | ICD-10-CM | POA: Diagnosis not present

## 2018-04-18 DIAGNOSIS — M25512 Pain in left shoulder: Secondary | ICD-10-CM

## 2018-04-18 MED ORDER — INSULIN GLARGINE 100 UNIT/ML SOLOSTAR PEN: 30.0000 [IU] | PEN_INJECTOR | Freq: Every day | SUBCUTANEOUS | 2 refills | Status: DC

## 2018-04-18 MED ORDER — ALPRAZOLAM 0.25 MG PO TABS: 0.2500 mg | ORAL_TABLET | Freq: Two times a day (BID) | ORAL | 5 refills | Status: DC | PRN

## 2018-04-18 MED FILL — LANTUS SOLOSTAR 100 UNITS/M: 100 | 30 days supply | Qty: 9 | Fill #0

## 2018-04-18 MED FILL — ALPRAZolam 0.25 MG TABS: 0.25 | 30 days supply | Qty: 60 | Fill #0

## 2018-04-18 NOTE — Telephone Encounter (Signed)
Spoke w/ pt wife and requested that he send a manual transmission b/c his home monitor has not updated in at least 14 days.   

## 2018-04-18 NOTE — Progress Notes (Signed)
   Subjective:    Patient ID: Travis [name], male    DOB: Sep 29, 1960, 57 y.o.   MRN: [id number]  Hyperlipidemia  This is a chronic problem. The current episode started more than 1 year ago. Pertinent negatives include no chest pain or shortness of breath. Treatments tried: atorvastatin 20mg . There are no compliance problems (takes meds every day, eats healthy except this time of year, walks and yard work for exercise).    Bilateral shoulder and arm pain. Started 3 years ago.  He relates a lot of aching in his shoulders He also relates some numbness into the hand This been going on over the past few months Seems to occur with a lot of activity Denies any other particular troubles   Sees Dr. Dorris Fetch for diabetes.   Needs refill on xanax.  He does uses Xanax intermittently Denies abusing it  Does take his cholesterol medicine on a regular basis needs refills Recent labs reviewed  Review of Systems  Constitutional: Negative for diaphoresis and fatigue.  HENT: Negative for congestion and rhinorrhea.   Respiratory: Negative for cough and shortness of breath.   Cardiovascular: Negative for chest pain and leg swelling.  Gastrointestinal: Negative for abdominal pain and diarrhea.  Skin: Negative for color change and rash.  Neurological: Negative for dizziness and headaches.  Psychiatric/Behavioral: Negative for behavioral problems and confusion.       Objective:   Physical Exam Vitals signs reviewed.  Constitutional:      General: He is not in acute distress. HENT:     Head: Normocephalic and atraumatic.  Eyes:     General:        Right eye: No discharge.        Left eye: No discharge.  Neck:     Trachea: No tracheal deviation.  Cardiovascular:     Rate and Rhythm: Normal rate and regular rhythm.     Heart sounds: Normal heart sounds. No murmur.  Pulmonary:     Effort: Pulmonary effort is normal. No respiratory distress.     Breath sounds: Normal breath sounds.    Lymphadenopathy:     Cervical: No cervical adenopathy.  Skin:    General: Skin is warm and dry.  Neurological:     Mental Status: He is alert.     Coordination: Coordination normal.  Psychiatric:        Behavior: Behavior normal.           Assessment & Plan:  Stress related issues Anxiety related issues Xanax uses intermittently refills given  Hyperlipidemia previous labs look good lab work ordered by Dr. Jordan Hawks given  Bilateral shoulder pain no findings of any type of rotator tear but more than likely has some arthritis and rotator cuff tendinitis I told the patient and we can help set him up with orthopedics when he is ready  Left hand neuralgia referral to neurology for nerve conduction study

## 2018-04-19 ENCOUNTER — Telehealth: Payer: Self-pay | Admitting: Family Medicine

## 2018-04-19 NOTE — Telephone Encounter (Signed)
Please advise. Thank you
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
## 2018-04-19 NOTE — Telephone Encounter (Signed)
Discussed with pt. Pt verbalized understanding and scheduled appt for tomorrow.

## 2018-04-19 NOTE — Telephone Encounter (Signed)
Please send him notification I already took care of this

## 2018-04-19 NOTE — Telephone Encounter (Signed)
It is impossible to diagnose this given this information I recommend consultation/office visit tomorrow

## 2018-04-19 NOTE — Progress Notes (Signed)
Referral ordered in Epic. 
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
## 2018-04-19 NOTE — Addendum Note (Signed)
Addended by: Dairl Ponder on: 04/19/2018 09:18 AM   Modules accepted: Orders

## 2018-04-20 ENCOUNTER — Encounter: Payer: Self-pay | Admitting: Nurse Practitioner

## 2018-04-20 ENCOUNTER — Encounter: Payer: Self-pay | Admitting: Family Medicine

## 2018-04-20 ENCOUNTER — Ambulatory Visit (INDEPENDENT_AMBULATORY_CARE_PROVIDER_SITE_OTHER): Payer: No Typology Code available for payment source | Admitting: Nurse Practitioner

## 2018-04-20 ENCOUNTER — Ambulatory Visit (INDEPENDENT_AMBULATORY_CARE_PROVIDER_SITE_OTHER): Payer: No Typology Code available for payment source | Admitting: Family Medicine

## 2018-04-20 VITALS — BP 140/80 | Temp 98.4°F | Ht 73.0 in | Wt 176.0 lb

## 2018-04-20 VITALS — BP 121/80 | HR 94 | Ht 73.0 in | Wt 175.6 lb

## 2018-04-20 DIAGNOSIS — E782 Mixed hyperlipidemia: Secondary | ICD-10-CM | POA: Diagnosis not present

## 2018-04-20 DIAGNOSIS — G4733 Obstructive sleep apnea (adult) (pediatric): Secondary | ICD-10-CM | POA: Diagnosis not present

## 2018-04-20 DIAGNOSIS — J111 Influenza due to unidentified influenza virus with other respiratory manifestations: Secondary | ICD-10-CM | POA: Diagnosis not present

## 2018-04-20 DIAGNOSIS — Z8673 Personal history of transient ischemic attack (TIA), and cerebral infarction without residual deficits: Secondary | ICD-10-CM | POA: Diagnosis not present

## 2018-04-20 DIAGNOSIS — I10 Essential (primary) hypertension: Secondary | ICD-10-CM | POA: Diagnosis not present

## 2018-04-20 MED ORDER — OSELTAMIVIR PHOSPHATE 75 MG PO CAPS: 75.0000 mg | ORAL_CAPSULE | Freq: Two times a day (BID) | ORAL | 0 refills | Status: AC

## 2018-04-20 MED FILL — OSELTAMIVIR PHOSPHATE 75 MG: 75 | 5 days supply | Qty: 10 | Fill #0

## 2018-04-20 NOTE — Patient Instructions (Addendum)
Continue eliquis, ASA  for stroke prevention Continue Lipitor for hyperlipidemia Continue  home exercise - Follow up with your primary care physician for stroke risk factor modification. Recommend maintain blood pressure goal <130/80, diabetes with hemoglobin A1c goal below 6.5% and lipids with LDL cholesterol goal below 70 mg/dL.  - check BP and glucose at home and record most recent hemoglobin A1c 8.6 Blood pressure in the office today 121/80 Follow up in one year as per pt wishes.  Stroke Prevention Some medical conditions and lifestyle choices can lead to a higher risk for a stroke. You can help to prevent a stroke by making nutrition, lifestyle, and other changes. What nutrition changes can be made?   Eat healthy foods. ? Choose foods that are high in fiber. These include:  Fresh fruits.  Fresh vegetables.  Whole grains. ? Eat at least 5 or more servings of fruits and vegetables each day. Try to fill half of your plate at each meal with fruits and vegetables. ? Choose lean protein foods. These include:  Lowfat (lean) cuts of meat.  Chicken without skin.  Fish.  Tofu.  Beans.  Nuts. ? Eat low-fat dairy products. ? Avoid foods that:  Are high in salt (sodium).  Have saturated fat.  Have trans fat.  Have cholesterol.  Are processed.  Are premade.  Follow eating guidelines as told by your doctor. These may include: ? Reducing how many calories you eat and drink each day. ? Limiting how much salt you eat or drink each day to 1,500 milligrams (mg). ? Using only healthy fats for cooking. These include:  Olive oil.  Canola oil.  Sunflower oil. ? Counting how many carbohydrates you eat and drink each day. What lifestyle changes can be made?  Try to stay at a healthy weight. Talk to your doctor about what a good weight is for you.  Get at least 30 minutes of moderate physical activity at least 5 days a week. This can include: ? Fast  walking. ? Biking. ? Swimming.  Do not use any products that have nicotine or tobacco. This includes cigarettes and e-cigarettes. If you need help quitting, ask your doctor. Avoid being around tobacco smoke in general.  Limit how much alcohol you drink to no more than 1 drink a day for nonpregnant women and 2 drinks a day for men. One drink equals 12 oz of beer, 5 oz of wine, or 1 oz of hard liquor.  Do not use drugs.  Avoid taking birth control pills. Talk to your doctor about the risks of taking birth control pills if: ? You are over 45 years old. ? You smoke. ? You get migraines. ? You have had a blood clot. What other changes can be made?  Manage your cholesterol. ? It is important to eat a healthy diet. ? If your cholesterol cannot be managed through your diet, you may also need to take medicines. Take medicines as told by your doctor.  Manage your diabetes. ? It is important to eat a healthy diet and to exercise regularly. ? If your blood sugar cannot be managed through diet and exercise, you may need to take medicines. Take medicines as told by your doctor.  Control your high blood pressure (hypertension). ? Try to keep your blood pressure below 130/80. This can help lower your risk of stroke. ? It is important to eat a healthy diet and to exercise regularly. ? If your blood pressure cannot be managed through diet and exercise,  you may need to take medicines. Take medicines as told by your doctor. ? Ask your doctor if you should check your blood pressure at home. ? Have your blood pressure checked every year. Do this even if your blood pressure is normal.  Talk to your doctor about getting checked for a sleep disorder. Signs of this can include: ? Snoring a lot. ? Feeling very tired.  Take over-the-counter and prescription medicines only as told by your doctor. These may include aspirin or blood thinners (antiplatelets or anticoagulants).  Make sure that any other  medical conditions you have are managed. Where to find more information  American Stroke Association: www.strokeassociation.org  National Stroke Association: www.stroke.org Get help right away if:  You have any symptoms of stroke. "BE FAST" is an easy way to remember the main warning signs: ? B - Balance. Signs are dizziness, sudden trouble walking, or loss of balance. ? E - Eyes. Signs are trouble seeing or a sudden change in how you see. ? F - Face. Signs are sudden weakness or loss of feeling of the face, or the face or eyelid drooping on one side. ? A - Arms. Signs are weakness or loss of feeling in an arm. This happens suddenly and usually on one side of the body. ? S - Speech. Signs are sudden trouble speaking, slurred speech, or trouble understanding what people say. ? T - Time. Time to call emergency services. Write down what time symptoms started.  You have other signs of stroke, such as: ? A sudden, very bad headache with no known cause. ? Feeling sick to your stomach (nausea). ? Throwing up (vomiting). ? Jerky movements you cannot control (seizure). These symptoms may represent a serious problem that is an emergency. Do not wait to see if the symptoms will go away. Get medical help right away. Call your local emergency services (911 in the [place] Do not drive yourself to the hospital. Summary  You can prevent a stroke by eating healthy, exercising, not smoking, drinking less alcohol, and treating other health problems, such as diabetes, high blood pressure, or high cholesterol.  Do not use any products that contain nicotine or tobacco, such as cigarettes and e-cigarettes.  Get help right away if you have any signs or symptoms of a stroke. This information is not intended to replace advice given to you by your health care provider. Make sure you discuss any questions you have with your health care provider. Document Released: 10/19/2011 Document Revised: 07/21/2016 Document  Reviewed: 07/21/2016 Elsevier Interactive Patient Education  2019 [employer].

## 2018-04-20 NOTE — Progress Notes (Signed)
I have read the note, and I agree with the clinical assessment and plan.  Travis [name]   

## 2018-04-20 NOTE — Progress Notes (Signed)
   Subjective:    Patient ID: Travis [name], male    DOB: 04/14/1961, 57 y.o.   MRN: [id number]  Cough  This is a new problem. Episode onset: 2 days. Associated symptoms comments: Congestion, headache, wheezing, body aches, . He has tried nothing for the symptoms.  Positive malaise.  Rather sudden onset.  Impressive severe achiness throughout the body.  No obvious fever  Cough intermittent dry in nature.  Headache diffuse entire head worse with cough worse with change in position    Review of Systems  Respiratory: Positive for cough.        Objective:   Physical Exam Alert vitals reviewed, moderate malaise. Hydration good. Positive nasal congestion lungs no crackles or wheezes, no tachypnea, intermittent bronchial cough during exam heart regular rate and rhythm.        Assessment & Plan:  Impression influenza discussed at length. Petra Kuba of illness and potential sequela discussed. Plan Tamiflu prescribed if indicated and timing appropriate. Symptom care discussed. Warning signs discussed. WSL

## 2018-04-22 ENCOUNTER — Other Ambulatory Visit: Payer: Self-pay | Admitting: "Endocrinology

## 2018-04-24 LAB — COMPREHENSIVE METABOLIC PANEL
ALT: 10 IU/L (ref 0–44)
AST: 19 IU/L (ref 0–40)
Albumin/Globulin Ratio: 1.5 (ref 1.2–2.2)
Albumin: 4.3 g/dL (ref 3.5–5.5)
Alkaline Phosphatase: 80 IU/L (ref 39–117)
BUN/Creatinine Ratio: 14 (ref 9–20)
BUN: 14 mg/dL (ref 6–24)
Bilirubin Total: 0.7 mg/dL (ref 0.0–1.2)
CALCIUM: 9.3 mg/dL (ref 8.7–10.2)
CO2: 22 mmol/L (ref 20–29)
Chloride: 101 mmol/L (ref 96–106)
Creatinine, Ser: 1.03 mg/dL (ref 0.76–1.27)
GFR calc Af Amer: 91 mL/min/{1.73_m2} (ref >59)
GFR, EST NON AFRICAN AMERICAN: 79 mL/min/{1.73_m2} (ref >59)
GLOBULIN, TOTAL: 2.8 g/dL (ref 1.5–4.5)
Glucose: 227 mg/dL — ABNORMAL HIGH (ref 65–99)
Potassium: 4.1 mmol/L (ref 3.5–5.2)
Sodium: 140 mmol/L (ref 134–144)
Total Protein: 7.1 g/dL (ref 6.0–8.5)

## 2018-04-24 LAB — HGB A1C W/O EAG: Hgb A1c MFr Bld: 8.5 % — ABNORMAL HIGH (ref 4.8–5.6)

## 2018-04-24 LAB — VITAMIN D 25 HYDROXY (VIT D DEFICIENCY, FRACTURES): Vit D, 25-Hydroxy: 42 ng/mL (ref 30.0–100.0)

## 2018-04-24 LAB — SPECIMEN STATUS REPORT

## 2018-04-27 ENCOUNTER — Ambulatory Visit: Payer: No Typology Code available for payment source | Admitting: "Endocrinology

## 2018-04-28 MED FILL — ELIQUIS 5 MG TABLET: 5 | 30 days supply | Qty: 60 | Fill #6

## 2018-05-02 LAB — CUP PACEART REMOTE DEVICE CHECK
Date Time Interrogation Session: 20191110010841
Implantable Pulse Generator Implant Date: 20161229

## 2018-05-02 MED FILL — MECLIZINE 25 MG TABLET: 25 | 10 days supply | Qty: 30 | Fill #3

## 2018-05-05 ENCOUNTER — Encounter: Payer: Self-pay | Admitting: Family Medicine

## 2018-05-08 ENCOUNTER — Telehealth: Payer: Self-pay | Admitting: Family Medicine

## 2018-05-08 NOTE — Telephone Encounter (Signed)
Wife [name] calling because she thought Brock was going to be referred to [facility] for his nerve conduction study not Dr. Merlene Laughter.  She said he is an active patient at [facility] and just saw them in December.  Would like to have the study done there if we could do the referral to them. (Has the [facility] plan ins.) Izora Ribas is redoing the referral to [place] and [name] notified.

## 2018-05-10 MED FILL — PREGABALIN 100 MG CAPS: 100 | 30 days supply | Qty: 90 | Fill #1

## 2018-05-16 ENCOUNTER — Ambulatory Visit (INDEPENDENT_AMBULATORY_CARE_PROVIDER_SITE_OTHER): Payer: No Typology Code available for payment source

## 2018-05-16 DIAGNOSIS — I63443 Cerebral infarction due to embolism of bilateral cerebellar arteries: Secondary | ICD-10-CM

## 2018-05-17 ENCOUNTER — Ambulatory Visit (INDEPENDENT_AMBULATORY_CARE_PROVIDER_SITE_OTHER): Payer: No Typology Code available for payment source | Admitting: "Endocrinology

## 2018-05-17 ENCOUNTER — Encounter: Payer: Self-pay | Admitting: "Endocrinology

## 2018-05-17 ENCOUNTER — Encounter: Payer: No Typology Code available for payment source | Admitting: Nutrition

## 2018-05-17 VITALS — BP 121/77 | HR 81 | Ht 73.0 in | Wt 181.0 lb

## 2018-05-17 DIAGNOSIS — E673 Hypervitaminosis D: Secondary | ICD-10-CM

## 2018-05-17 DIAGNOSIS — E782 Mixed hyperlipidemia: Secondary | ICD-10-CM

## 2018-05-17 DIAGNOSIS — E1159 Type 2 diabetes mellitus with other circulatory complications: Secondary | ICD-10-CM

## 2018-05-17 MED ORDER — BLOOD GLUCOSE MONITOR SYSTEM W/DEVICE KIT: PACK | 5 refills | Status: DC

## 2018-05-17 MED ORDER — INSULIN GLARGINE 100 UNIT/ML SOLOSTAR PEN: 35.0000 [IU] | PEN_INJECTOR | Freq: Every day | SUBCUTANEOUS | 2 refills | Status: DC

## 2018-05-17 MED ORDER — CHOLECALCIFEROL 50 MCG (2000 UT) PO CAPS: 1.0000 | ORAL_CAPSULE | Freq: Every day | ORAL | 6 refills | Status: AC

## 2018-05-17 NOTE — Progress Notes (Signed)
[facility] Summary Report / Loop Recorder 

## 2018-05-17 NOTE — Progress Notes (Signed)
Endocrinology follow-up note       05/17/2018, 3:57 PM   Subjective:    Patient ID: Travis [name], male    DOB: [birth year].  [name] is being seen in consultation for management of currently uncontrolled symptomatic diabetes requested by  Kathyrn Drown, MD.   Past Medical History:  Diagnosis Date  . A-fib ([place]) 02/2016   found on loop recorder  . 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 Anxiety   . Arthritis   . Blood transfusion without reported diagnosis   . Chronic headaches   . Complication of anesthesia    bleed after last shoulder-had to stay overnight  . Depression   . Diabetes mellitus   . Diabetic peripheral neuropathy ([place]) 03/28/2013  . Diverticulosis   . Dizziness   . Hernia, inguinal   . Hyperlipidemia   . Hypertension   . Impingement syndrome of left shoulder 08/02/2014  . 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 Neuropathy of lower extremity   . Night sweats    every once in a while  . Sleep apnea    no CPAP  . Stroke ([place]) 02/2015  . Syncope and collapse    Past Surgical History:  Procedure Laterality Date  . CERVICAL DISC SURGERY    . COLON SURGERY  2003   1/3 removed for diverticulitis  . COLONOSCOPY    . EP IMPLANTABLE DEVICE N/A 05/01/2015   Procedure: Loop Recorder Insertion;  Surgeon: [name], MD;  Location: [facility];  Service: Cardiovascular;  Laterality: N/A;  . INGUINAL HERNIA REPAIR Bilateral   . KNEE ARTHROSCOPY Right   . SHOULDER ARTHROSCOPY Right    1  . SHOULDER ARTHROSCOPY Left 08/02/2014   Procedure: LEFT SHOULDER SCOPE DEBRIDEMENT/ACROMIOPLASTY;  Surgeon: [name], MD;  Location: [facility];  Service: Orthopedics;  Laterality: Left;  ANESTHESIA: GENERAL, PRE/POST OP SCALENE  . TEE WITHOUT CARDIOVERSION N/A 03/03/2015   Procedure: TRANSESOPHAGEAL ECHOCARDIOGRAM (TEE);  Surgeon: [name], MD;  [facility]: [facility];  Service: Cardiology;  Laterality: N/A;  . UMBILICAL HERNIA REPAIR     with other hernia repair with mesh  . WRIST SURGERY Right    fusion   Social History   Socioeconomic History  . Marital status: Married    Spouse name: Not on file  . Number of children: 2  . Years of education: College  . Highest education level: Not on file  Occupational History  . Occupation: disabled  Social Needs  . Financial resource strain: Not on file  . Food insecurity:    Worry: Not on file    Inability: Not on file  . Transportation needs:    Medical: Not on file    Non-medical: Not on file  Tobacco Use  . Smoking status: Former Smoker    Packs/day: 1.00    Years: 8.00    Pack years: 8.00    Types: Cigarettes    Start date: 06/07/1970    Last attempt to quit: 05/03/1978    Years since quitting: 40.0  . Smokeless tobacco: Former Systems developer    Types: [place] date: 05/03/1978  Substance and Sexual  Activity  . Alcohol use: No    Alcohol/week: 1.0 standard drinks    Types: 1 Cans of beer per week    Comment: h/o heavy use in the past  . Drug use: No  . Sexual activity: Not on file  Lifestyle  . Physical activity:    Days per week: Not on file    Minutes per session: Not on file  . Stress: Not on file  Relationships  . Social connections:    Talks on phone: Not on file    Gets together: Not on file    Attends religious service: Not on file    Active member of club or organization: Not on file    Attends meetings of clubs or organizations: Not on file    Relationship status: Not on file  Other Topics Concern  . Not on file  Social History Narrative   Drinks some coffee, Drink diet sodas and tea   Outpatient Encounter Medications as of 05/17/2018  Medication Sig  . ALPRAZolam (XANAX) 0.25 MG tablet Take 1 tablet (0.25 mg total) by mouth 2 (two) times daily as needed.  Marland Kitchen apixaban (ELIQUIS) 5 MG TABS tablet Take 1 tablet (5 mg total) by mouth 2 (two) times daily.  Marland Kitchen aspirin EC  81 MG tablet Take 1 tablet (81 mg total) by mouth daily.  Marland Kitchen atorvastatin (LIPITOR) 20 MG tablet Take 1 tablet (20 mg total) by mouth daily.  . Blood Glucose Monitoring Suppl (BLOOD GLUCOSE MONITOR SYSTEM) w/Device KIT Test glucose 2 time daily. Accu-Chek Guide Me. meter/strips/lancets  . Cholecalciferol 50 MCG (2000 UT) CAPS Take 1 capsule (2,000 Units total) by mouth daily with breakfast.  . folic acid (FOLVITE) 1 MG tablet TAKE 1 TABLET BY MOUTH DAILY.  Marland Kitchen Insulin Glargine (LANTUS) 100 UNIT/ML Solostar Pen Inject 35 Units into the skin at bedtime.  . meclizine (ANTIVERT) 25 MG tablet TAKE 1 TABLET BY MOUTH 3 TIMES DAILY AS NEEDED FOR DIZZINESS.  . metFORMIN (GLUCOPHAGE) 500 MG tablet TAKE 2 TABLETS BY MOUTH TWICE DAILY  . Multiple Vitamin (MULTIVITAMIN WITH MINERALS) TABS tablet Take 1 tablet daily by mouth.  . pregabalin (LYRICA) 100 MG capsule TAKE 1 CAPSULE BY MOUTH 3 TIMES A DAY  . traMADol (ULTRAM) 50 MG tablet TAKE 1 TABLET BY MOUTH THREE TIMES DAILY AS NEEDED FOR MODREATE PAIN  . UNIFINE PENTIPS 31G X 8 MM MISC USE AS DIRECTED WITH LEVEMIR INJECTIONS DAILY  . valACYclovir (VALTREX) 1000 MG tablet TAKE 1 TABLET BY MOUTH ONCE DAILY  . [DISCONTINUED] Blood Glucose Monitoring Suppl (BLOOD GLUCOSE MONITOR SYSTEM) w/Device KIT Test glucose 1 time daily.  . [DISCONTINUED] Insulin Detemir (LEVEMIR) 100 UNIT/ML Pen Inject 30 Units into the skin at bedtime.  . [DISCONTINUED] Insulin Glargine (LANTUS) 100 UNIT/ML Solostar Pen Inject 30 Units into the skin at bedtime.   No facility-administered encounter medications on file as of 05/17/2018.     ALLERGIES: Allergies  Allergen Reactions  . Lisinopril Cough    Patient/spouse is not aware/familiar with why this is listed as an allergy    VACCINATION STATUS: Immunization History  Administered Date(s) Administered  . Influenza Split 03/28/2013  . Influenza,inj,Quad PF,6+ Mos 02/18/2014, 01/23/2015, 02/06/2016, 02/23/2017, 02/24/2018  .  Influenza-Unspecified 01/02/2011  . Pneumococcal Polysaccharide-23 01/31/2010, 03/01/2015  . Tdap 02/18/2014    Diabetes  He presents for his follow-up diabetic visit. He has type 2 diabetes mellitus. Onset time: He was diagnosed at approximate age of 58 years. His disease course has been stable (  He has prior history of heavy alcohol use/abuse.). There are no hypoglycemic associated symptoms. Pertinent negatives for hypoglycemia include no confusion, headaches, pallor or seizures. Pertinent negatives for diabetes include no chest pain, no fatigue, no polydipsia, no polyphagia, no polyuria and no weakness. There are no hypoglycemic complications. Symptoms are improving. Diabetic complications include a CVA. Risk factors for coronary artery disease include dyslipidemia, diabetes mellitus, hypertension, family history, male sex and tobacco exposure. Current diabetic treatment includes insulin injections and oral agent (monotherapy). He is compliant with treatment most of the time. His weight is increasing steadily. He is following a generally unhealthy diet. When asked about meal planning, he reported none. He has had a previous visit with a dietitian. He participates in exercise intermittently. His breakfast blood glucose range is generally 180-200 mg/dl. His overall blood glucose range is 180-200 mg/dl. (He returns with average blood glucose of 179 over the last 30 days.  His A1c is 8.5%, slowly improving.) An ACE inhibitor/angiotensin II receptor blocker is contraindicated.  Hyperlipidemia  This is a chronic problem. The current episode started more than 1 year ago. The problem is controlled. Recent lipid tests were reviewed and are normal. Exacerbating diseases include diabetes. Factors aggravating his hyperlipidemia include smoking. Pertinent negatives include no chest pain, myalgias or shortness of breath. Current antihyperlipidemic treatment includes statins. The current treatment provides moderate  improvement of lipids. Risk factors for coronary artery disease include dyslipidemia, diabetes mellitus, hypertension, male sex, family history and a sedentary lifestyle.  Hypertension  This is a chronic problem. The current episode started more than 1 year ago. The problem is controlled. Pertinent negatives include no chest pain, headaches, neck pain, palpitations or shortness of breath. Risk factors for coronary artery disease include dyslipidemia, diabetes mellitus, family history, male gender, sedentary lifestyle and smoking/tobacco exposure. Hypertensive end-organ damage includes CVA.    Review of Systems  Constitutional: Negative for chills, fatigue, fever and unexpected weight change.  HENT: Negative for dental problem, mouth sores and trouble swallowing.   Eyes: Negative for visual disturbance.  Respiratory: Negative for cough, choking, chest tightness, shortness of breath and wheezing.   Cardiovascular: Negative for chest pain, palpitations and leg swelling.  Gastrointestinal: Negative for abdominal distention, abdominal pain, constipation, diarrhea, nausea and vomiting.  Endocrine: Negative for polydipsia, polyphagia and polyuria.  Genitourinary: Negative for dysuria, flank pain, hematuria and urgency.  Musculoskeletal: Negative for back pain, gait problem, myalgias and neck pain.  Skin: Negative for pallor, rash and wound.  Neurological: Negative for seizures, syncope, weakness, numbness and headaches.  Psychiatric/Behavioral: Negative.  Negative for confusion and dysphoric mood.    Objective:    BP 121/77   Pulse 81   Ht _0  (1.854 m)   Wt 181 lb (82.1 kg)   BMI 23.88 kg/m   Wt Readings from Last 3 Encounters:  05/17/18 181 lb (82.1 kg)  04/20/18 175 lb 9.6 oz (79.7 kg)  04/20/18 176 lb (79.8 kg)     Physical Exam  Constitutional: He is oriented to person, place, and time. He appears well-developed. He is cooperative. No distress.  HENT:  Head: Normocephalic and  atraumatic.  Eyes: EOM are normal.  Neck: Normal range of motion. Neck supple. No tracheal deviation present. No thyromegaly present.  Cardiovascular: Normal rate, S1 normal, S2 normal and normal heart sounds. Exam reveals no gallop.  No murmur heard. Pulses:      Dorsalis pedis pulses are 1+ on the right side and 1+ on the left side.  Posterior tibial pulses are 1+ on the right side and 1+ on the left side.  Pulmonary/Chest: Breath sounds normal. No respiratory distress. He has no wheezes.  Abdominal: Soft. Bowel sounds are normal. He exhibits no distension. There is no abdominal tenderness. There is no guarding and no CVA tenderness.  Musculoskeletal:        General: No edema.     Right shoulder: He exhibits no swelling and no deformity.  Neurological: He is alert and oriented to person, place, and time. He has normal strength and normal reflexes. No cranial nerve deficit or sensory deficit. Gait normal.  Skin: Skin is warm and dry. No rash noted. No cyanosis. Nails show no clubbing.  Psychiatric: He has a normal mood and affect. His speech is normal. Cognition and memory are normal.    CMP ( most recent) CMP     Component Value Date/Time   NA 140 04/22/2018 0836   K 4.1 04/22/2018 0836   CL 101 04/22/2018 0836   CO2 22 04/22/2018 0836   GLUCOSE 227 (H) 04/22/2018 0836   GLUCOSE 403 (H) 03/15/2017 1544   BUN 14 04/22/2018 0836   CREATININE 1.03 04/22/2018 0836   CREATININE 0.82 02/16/2014 1053   CALCIUM 9.3 04/22/2018 0836   PROT 7.1 04/22/2018 0836   ALBUMIN 4.3 04/22/2018 0836   AST 19 04/22/2018 0836   ALT 10 04/22/2018 0836   ALKPHOS 80 04/22/2018 0836   BILITOT 0.7 04/22/2018 0836   GFRNONAA 79 04/22/2018 0836   GFRAA 91 04/22/2018 0836     Diabetic Labs (most recent): Lab Results  Component Value Date   HGBA1C 8.5 (H) 04/22/2018   HGBA1C 8.6 (H) 01/24/2018   HGBA1C 7.9 (H) 10/13/2017     Lipid Panel ( most recent) Lipid Panel     Component Value  Date/Time   CHOL 102 01/24/2018 0818   TRIG 41 01/24/2018 0818   HDL 39 (L) 01/24/2018 0818   CHOLHDL 2.6 01/24/2018 0818   CHOLHDL 3.2 05/03/2016 0545   VLDL 16 05/03/2016 0545   LDLCALC 55 01/24/2018 0818      Lab Results  Component Value Date   TSH 1.960 03/12/2017   TSH 1.460 10/07/2016   TSH 0.720 05/02/2016   TSH 1.895 10/14/2014   FREET4 1.21 10/07/2016      Assessment & Plan:   1. DM type 2 causing vascular disease ([place]) - Jackey Loge has currently uncontrolled symptomatic type 2 DM since  58 years of age. He came with slightly improved A1c of 8.5%, overall improving from 14.6%.  -his diabetes is complicated by CVA, history of smoking, history of heavy alcohol use/abuse in the past and DEONDRICK SEARLS remains at a high risk for more acute and chronic complications which include CAD, CVA, CKD, retinopathy, and neuropathy. These are all discussed in detail with the patient.  - I have counseled him on diet management  by adopting a carbohydrate restricted/protein rich diet. -He still admits to dietary indiscretion including consumption of sweetened beverages.  -  Suggestion is made for him to avoid simple carbohydrates  from his diet including Cakes, Sweet Desserts / Pastries, Ice Cream, Soda (diet and regular), Sweet Tea, Candies, Chips, Cookies, Store Bought Juices, Alcohol in Excess of  1-2 drinks a day, Artificial Sweeteners, and "Sugar-free" Products. This will help patient to have stable blood glucose profile and potentially avoid unintended weight gain.   - I encouraged him to switch to  unprocessed or minimally processed complex starch  and increased protein intake (animal or plant source), fruits, and vegetables.  - he is advised to stick to a routine mealtimes to eat 3 meals  a day and avoid unnecessary snacks ( to snack only to correct hypoglycemia).   - He is already following with Kieth Brightly crumpton, CDE.  - I have approached him with the following  individualized plan to manage diabetes and patient agrees:   - There could be a component of pancreatic endocrine and exocrine insufficiency given his prior history of heavy alcohol use, which would make it necessary for him to eventually use insulin basal/bolus to  control diabetes to target. -#1 priority in this patient is to avoid hypoglycemia.   -He is advised to increase Lantus to 35 units at bedtime, associated with strict monitoring of blood glucose 2 times a day before breakfast and at bedtime.  -Patient is encouraged to call clinic for blood glucose levels less than 70 or above 300 mg /dl. -Is advised to continue metformin  500 mg by mouth twice a day with meals, therapeutically suitable for patient .  -Patient is not a candidate for SGLT2 inhibitors, nor incretin therapy due to his body habitus.  - Patient specific target  A1c;  LDL, HDL, Triglycerides, and  Waist Circumference were discussed in detail.  2) BP/HTN:  His records indicate intolerance to lisinopril. His blood pressure is controlled to target at this time. 3) Lipids/HPL: His recent lipid panel showed controlled  LDL at 40.  He is advised to continue atorvastatin 20 mg p.o. nightly.    4)  Weight/Diet: CDE Consult  is in progress , exercise, and detailed carbohydrates information provided.  Weight loss is not advisable for him.  If he continues to lose weight unintentionally, he will be considered for Creon therapy.      4) Hypervitaminosis D-  His vitamin D level as stabilized at 42, may need maintenance supplement.  I discussed and added vitamin D3 2000 units daily.  6) Chronic Care/Health Maintenance:  -he  is on Statin medications and  is encouraged to continue to follow up with Ophthalmology, Dentist,  Podiatrist at least yearly or according to recommendations, and advised to  stay away from smoking. I have recommended yearly flu vaccine and pneumonia vaccination at least every 5 years; moderate intensity exercise  for up to 150 minutes weekly; and  sleep for at least 7 hours a day.  - I advised patient to maintain close follow up with Kathyrn Drown, MD for primary care needs.  - Time spent with the patient: 25 min, of which >50% was spent in reviewing his blood glucose logs , discussing his hypo- and hyper-glycemic episodes, reviewing his current and  previous labs and insulin doses and developing a plan to avoid hypo- and hyper-glycemia. Please refer to Patient Instructions for Blood Glucose Monitoring and Insulin/Medications Dosing Guide"  in media tab for additional information. Jackey Loge participated in the discussions, expressed understanding, and voiced agreement with the above plans.  All questions were answered to his satisfaction. he is encouraged to contact clinic should he have any questions or concerns prior to his return visit.  Follow up plan: - Return in about 6 months (around 11/15/2018) for Follow up with Pre-visit Labs, Meter, and Logs.  Glade Lloyd, MD [facility] [address] [place], [place] [address] Phone: [phone]  Fax: [phone]    05/17/2018, 3:57 PM  This note was partially dictated with voice recognition software.  Similar sounding words can be transcribed inadequately or may not  be corrected upon review.

## 2018-05-18 ENCOUNTER — Other Ambulatory Visit: Payer: Self-pay

## 2018-05-18 MED ORDER — INSULIN GLARGINE 100 UNIT/ML SOLOSTAR PEN: 35.0000 [IU] | PEN_INJECTOR | Freq: Every day | SUBCUTANEOUS | 2 refills | Status: DC

## 2018-05-18 MED ORDER — BLOOD GLUCOSE MONITOR SYSTEM W/DEVICE KIT: PACK | 5 refills | Status: DC

## 2018-05-18 MED FILL — FREESTYLE LITE TEST STRIP: 50 days supply | Qty: 100 | Fill #0

## 2018-05-18 MED FILL — FREESTYLE LITE METER: 25 days supply | Qty: 1 | Fill #0

## 2018-05-18 MED FILL — FREESTYLE LANCETS: 50 days supply | Qty: 100 | Fill #0

## 2018-05-19 LAB — CUP PACEART REMOTE DEVICE CHECK
Date Time Interrogation Session: 20200115013926
Implantable Pulse Generator Implant Date: 20161229

## 2018-05-19 MED FILL — LANTUS SOLOSTAR 100 UNITS/M: 100 | 30 days supply | Qty: 9 | Fill #1

## 2018-05-22 ENCOUNTER — Other Ambulatory Visit: Payer: Self-pay

## 2018-05-22 ENCOUNTER — Telehealth: Payer: Self-pay

## 2018-05-22 DIAGNOSIS — E1159 Type 2 diabetes mellitus with other circulatory complications: Secondary | ICD-10-CM

## 2018-05-22 MED ORDER — INSULIN GLARGINE 100 UNIT/ML SOLOSTAR PEN: 35.0000 [IU] | PEN_INJECTOR | Freq: Every day | SUBCUTANEOUS | 1 refills | Status: DC

## 2018-05-22 MED ORDER — ACCU-CHEK FASTCLIX LANCETS MISC: 1.0000 | Freq: Four times a day (QID) | 3 refills | Status: DC

## 2018-05-22 MED FILL — ACCU-CHEK FASTCLIX LANCETS: 26 days supply | Qty: 102 | Fill #0

## 2018-05-22 NOTE — Telephone Encounter (Signed)
SIGNED

## 2018-05-23 ENCOUNTER — Other Ambulatory Visit: Payer: Self-pay | Admitting: Family Medicine

## 2018-05-23 MED FILL — valACYclovir HCL 1 GM TABS: 1 | 90 days supply | Qty: 90 | Fill #0

## 2018-05-23 MED FILL — ALPRAZolam 0.25 MG TABS: 0.25 | 30 days supply | Qty: 60 | Fill #1

## 2018-05-25 ENCOUNTER — Encounter (INDEPENDENT_AMBULATORY_CARE_PROVIDER_SITE_OTHER): Payer: No Typology Code available for payment source | Admitting: Diagnostic Neuroimaging

## 2018-05-25 ENCOUNTER — Ambulatory Visit (INDEPENDENT_AMBULATORY_CARE_PROVIDER_SITE_OTHER): Payer: No Typology Code available for payment source | Admitting: Diagnostic Neuroimaging

## 2018-05-25 DIAGNOSIS — R200 Anesthesia of skin: Secondary | ICD-10-CM | POA: Diagnosis not present

## 2018-05-25 DIAGNOSIS — G5600 Carpal tunnel syndrome, unspecified upper limb: Secondary | ICD-10-CM

## 2018-05-25 DIAGNOSIS — G542 Cervical root disorders, not elsewhere classified: Secondary | ICD-10-CM

## 2018-05-25 DIAGNOSIS — Z0289 Encounter for other administrative examinations: Secondary | ICD-10-CM

## 2018-05-25 DIAGNOSIS — R202 Paresthesia of skin: Secondary | ICD-10-CM

## 2018-05-25 LAB — CUP PACEART REMOTE DEVICE CHECK
Date Time Interrogation Session: 20191213011003
Implantable Pulse Generator Implant Date: 20161229

## 2018-05-26 ENCOUNTER — Other Ambulatory Visit (HOSPITAL_COMMUNITY): Payer: Self-pay | Admitting: Nurse Practitioner

## 2018-05-26 MED FILL — ELIQUIS 5 MG TABLET: 5 | 30 days supply | Qty: 60 | Fill #0

## 2018-05-26 NOTE — Telephone Encounter (Signed)
Pts wife, [name], [place] on file, called to request a refill of pts eliquis. She has been made aware that pt hadn't been seen since 04/2016 and pt would need to make an appt. Scheduled pt for 05/31/2018 3:30 and send in 30 days worth of Eliquis to [facility].

## 2018-05-31 ENCOUNTER — Encounter (HOSPITAL_COMMUNITY): Payer: Self-pay | Admitting: Nurse Practitioner

## 2018-05-31 ENCOUNTER — Other Ambulatory Visit (HOSPITAL_COMMUNITY): Payer: Self-pay | Admitting: *Deleted

## 2018-05-31 ENCOUNTER — Ambulatory Visit (HOSPITAL_COMMUNITY)
Admission: RE | Admit: 2018-05-31 | Discharge: 2018-05-31 | Disposition: A | Discharge status: Home / Self Care | Payer: No Typology Code available for payment source | Source: Ambulatory Visit | Attending: Nurse Practitioner | Admitting: Nurse Practitioner

## 2018-05-31 VITALS — BP 126/64 | HR 78 | Ht 73.0 in | Wt 180.6 lb

## 2018-05-31 DIAGNOSIS — E1142 Type 2 diabetes mellitus with diabetic polyneuropathy: Secondary | ICD-10-CM | POA: Diagnosis not present

## 2018-05-31 DIAGNOSIS — Z7901 Long term (current) use of anticoagulants: Secondary | ICD-10-CM | POA: Diagnosis not present

## 2018-05-31 DIAGNOSIS — Z8673 Personal history of transient ischemic attack (TIA), and cerebral infarction without residual deficits: Secondary | ICD-10-CM | POA: Insufficient documentation

## 2018-05-31 DIAGNOSIS — E785 Hyperlipidemia, unspecified: Secondary | ICD-10-CM | POA: Insufficient documentation

## 2018-05-31 DIAGNOSIS — Z79899 Other long term (current) drug therapy: Secondary | ICD-10-CM | POA: Diagnosis not present

## 2018-05-31 DIAGNOSIS — Z87891 Personal history of nicotine dependence: Secondary | ICD-10-CM | POA: Diagnosis not present

## 2018-05-31 DIAGNOSIS — Z794 Long term (current) use of insulin: Secondary | ICD-10-CM | POA: Insufficient documentation

## 2018-05-31 DIAGNOSIS — I63443 Cerebral infarction due to embolism of bilateral cerebellar arteries: Secondary | ICD-10-CM | POA: Diagnosis not present

## 2018-05-31 DIAGNOSIS — Z7982 Long term (current) use of aspirin: Secondary | ICD-10-CM | POA: Insufficient documentation

## 2018-05-31 DIAGNOSIS — I480 Paroxysmal atrial fibrillation: Secondary | ICD-10-CM | POA: Diagnosis not present

## 2018-05-31 DIAGNOSIS — F419 Anxiety disorder, unspecified: Secondary | ICD-10-CM | POA: Diagnosis not present

## 2018-05-31 DIAGNOSIS — Z823 Family history of stroke: Secondary | ICD-10-CM | POA: Diagnosis not present

## 2018-05-31 DIAGNOSIS — I10 Essential (primary) hypertension: Secondary | ICD-10-CM | POA: Insufficient documentation

## 2018-05-31 DIAGNOSIS — Z8249 Family history of ischemic heart disease and other diseases of the circulatory system: Secondary | ICD-10-CM | POA: Insufficient documentation

## 2018-05-31 DIAGNOSIS — I4891 Unspecified atrial fibrillation: Secondary | ICD-10-CM | POA: Diagnosis present

## 2018-05-31 DIAGNOSIS — Z888 Allergy status to other drugs, medicaments and biological substances status: Secondary | ICD-10-CM | POA: Diagnosis not present

## 2018-05-31 MED ORDER — APIXABAN 5 MG PO TABS: 5.0000 mg | ORAL_TABLET | Freq: Two times a day (BID) | ORAL | 11 refills | Status: DC

## 2018-05-31 NOTE — Progress Notes (Signed)
Primary Care Physician: Kathyrn Drown, MD Referring Physician: Dr. Andee Poles is a 58 y.o. male with a h/o CVA, s/p linq with transient afib noted that was seen by me in 2017 for start of eliquis. I have not seen him since then. He is here today to get refills on his eliquis. He has SR on his EKG and has not noted any irregular HR's. Review of his Paceart  device checks  have shown absence of afib. He is doing well. No bleeding issues. Last cmet  In December showed normal creatinine at 1.03.   Today, he denies symptoms of palpitations, chest pain, shortness of breath, orthopnea, PND, lower extremity edema, dizziness, presyncope, syncope, or neurologic sequela. The patient is tolerating medications without difficulties and is otherwise without complaint today.   Past Medical History:  Diagnosis Date  . A-fib ([place]) 02/2016   found on loop recorder  . Adhesive capsulitis of left shoulder 08/02/2014  . Anxiety   . Arthritis   . Blood transfusion without reported diagnosis   . Chronic headaches   . Complication of anesthesia    bleed after last shoulder-had to stay overnight  . Depression   . Diabetes mellitus   . Diabetic peripheral neuropathy ([place]) 03/28/2013  . Diverticulosis   . Dizziness   . Hernia, inguinal   . Hyperlipidemia   . Hypertension   . Impingement syndrome of left shoulder 08/02/2014  . 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 Neuropathy of lower extremity   . Night sweats    every once in a while  . Sleep apnea    no CPAP  . Stroke ([place]) 02/2015  . Syncope and collapse    Past Surgical History:  Procedure Laterality Date  . CERVICAL DISC SURGERY    . COLON SURGERY  2003   1/3 removed for diverticulitis  . COLONOSCOPY    . EP IMPLANTABLE DEVICE N/A 05/01/2015   Procedure: Loop Recorder Insertion;  Surgeon: [name], MD;  Location: [facility];  Service: Cardiovascular;  Laterality: N/A;  . INGUINAL HERNIA REPAIR Bilateral   . KNEE  ARTHROSCOPY Right   . SHOULDER ARTHROSCOPY Right    1  . SHOULDER ARTHROSCOPY Left 08/02/2014   Procedure: LEFT SHOULDER SCOPE DEBRIDEMENT/ACROMIOPLASTY;  Surgeon: [name], MD;  Location: [facility];  Service: Orthopedics;  Laterality: Left;  ANESTHESIA: GENERAL, PRE/POST OP SCALENE  . TEE WITHOUT CARDIOVERSION N/A 03/03/2015   Procedure: TRANSESOPHAGEAL ECHOCARDIOGRAM (TEE);  Surgeon: [name], MD;  Location: [facility];  Service: Cardiology;  Laterality: N/A;  . UMBILICAL HERNIA REPAIR     with other hernia repair with mesh  . WRIST SURGERY Right    fusion    Current Outpatient Medications  Medication Sig Dispense Refill  . ACCU-CHEK FASTCLIX LANCETS MISC 1 Dose by Does not apply route 4 (four) times daily. 100 each 3  . ALPRAZolam (XANAX) 0.25 MG tablet Take 1 tablet (0.25 mg total) by mouth 2 (two) times daily as needed. 60 tablet 5  . apixaban (ELIQUIS) 5 MG TABS tablet Take 1 tablet (5 mg total) by mouth 2 (two) times daily. 60 tablet 11  . aspirin EC 81 MG tablet Take 1 tablet (81 mg total) by mouth daily.    Marland Kitchen atorvastatin (LIPITOR) 20 MG tablet Take 1 tablet (20 mg total) by mouth daily. 90 tablet 1  . Blood Glucose Monitoring Suppl (BLOOD GLUCOSE MONITOR SYSTEM) w/Device KIT Test glucose 2  time daily.  Meter/strips/lancets. Please dispense per patient/insurance preferrence 1 each 5  . Cholecalciferol 50 MCG (2000 UT) CAPS Take 1 capsule (2,000 Units total) by mouth daily with breakfast. 30 each 6  . folic acid (FOLVITE) 1 MG tablet TAKE 1 TABLET BY MOUTH DAILY. 90 tablet 1  . Insulin Glargine (LANTUS) 100 UNIT/ML Solostar Pen Inject 35 Units into the skin at bedtime. 45 mL 1  . meclizine (ANTIVERT) 25 MG tablet TAKE 1 TABLET BY MOUTH 3 TIMES DAILY AS NEEDED FOR DIZZINESS. 30 tablet 3  . metFORMIN (GLUCOPHAGE) 500 MG tablet TAKE 2 TABLETS BY MOUTH TWICE DAILY 360 tablet 0  . Multiple Vitamin (MULTIVITAMIN WITH MINERALS) TABS tablet Take 1 tablet  daily by mouth.    . pregabalin (LYRICA) 100 MG capsule TAKE 1 CAPSULE BY MOUTH 3 TIMES A DAY 90 capsule 5  . UNIFINE PENTIPS 31G X 8 MM MISC USE AS DIRECTED WITH LEVEMIR INJECTIONS DAILY 100 each 0  . valACYclovir (VALTREX) 1000 MG tablet TAKE 1 TABLET BY MOUTH ONCE DAILY 90 tablet PRN   No current facility-administered medications for this encounter.     Allergies  Allergen Reactions  . Lisinopril Cough    Patient/spouse is not aware/familiar with why this is listed as an allergy    Social History   Socioeconomic History  . Marital status: Married    Spouse name: Not on file  . Number of children: 2  . Years of education: College  . Highest education level: Not on file  Occupational History  . Occupation: disabled  Social Needs  . Financial resource strain: Not on file  . Food insecurity:    Worry: Not on file    Inability: Not on file  . Transportation needs:    Medical: Not on file    Non-medical: Not on file  Tobacco Use  . Smoking status: Former Smoker    Packs/day: 1.00    Years: 8.00    Pack years: 8.00    Types: Cigarettes    Start date: 06/07/1970    Last attempt to quit: 05/03/1978    Years since quitting: 40.1  . Smokeless tobacco: Former Systems developer    Types: [place] date: 05/03/1978  Substance and Sexual Activity  . Alcohol use: No    Alcohol/week: 1.0 standard drinks    Types: 1 Cans of beer per week    Comment: h/o heavy use in the past  . Drug use: No  . Sexual activity: Not on file  Lifestyle  . Physical activity:    Days per week: Not on file    Minutes per session: Not on file  . Stress: Not on file  Relationships  . Social connections:    Talks on phone: Not on file    Gets together: Not on file    Attends religious service: Not on file    Active member of club or organization: Not on file    Attends meetings of clubs or organizations: Not on file    Relationship status: Not on file  . Intimate partner violence:    Fear of current or ex  partner: Not on file    Emotionally abused: Not on file    Physically abused: Not on file    Forced sexual activity: Not on file  Other Topics Concern  . Not on file  Social History Narrative   Drinks some coffee, Drink diet sodas and tea    Family History  Problem Relation  Age of Onset  . Stroke Father   . Hyperlipidemia Father   . Heart attack Sister 24  . Stroke Sister   . Dementia Mother   . ALS Brother        age 62  . Diabetes Maternal Grandfather   . Colon cancer Neg Hx   . Esophageal cancer Neg Hx   . Stomach cancer Neg Hx   . Rectal cancer Neg Hx     ROS- All systems are reviewed and negative except as per the HPI above  Physical Exam: Vitals:   05/31/18 1547  BP: 126/64  Pulse: 78  Weight: 81.9 kg  Height: '6\' 1"'  (1.854 m)   Wt Readings from Last 3 Encounters:  05/31/18 81.9 kg  05/17/18 82.1 kg  04/20/18 79.7 kg    Labs: Lab Results  Component Value Date   NA 140 04/22/2018   K 4.1 04/22/2018   CL 101 04/22/2018   CO2 22 04/22/2018   GLUCOSE 227 (H) 04/22/2018   BUN 14 04/22/2018   CREATININE 1.03 04/22/2018   CALCIUM 9.3 04/22/2018   Lab Results  Component Value Date   INR 1.15 05/02/2016   Lab Results  Component Value Date   CHOL 102 01/24/2018   HDL 39 (L) 01/24/2018   LDLCALC 55 01/24/2018   TRIG 41 01/24/2018     GEN- The patient is well appearing, alert and oriented x 3 today.   Head- normocephalic, atraumatic Eyes-  Sclera clear, conjunctiva pink Ears- hearing intact Oropharynx- clear Neck- supple, no JVP Lymph- no cervical lymphadenopathy Lungs- Clear to ausculation bilaterally, normal work of breathing Heart- Regular rate and rhythm, no murmurs, rubs or gallops, PMI not laterally displaced GI- soft, NT, ND, + BS Extremities- no clubbing, cyanosis, or edema MS- no significant deformity or atrophy Skin- no rash or lesion Psych- euthymic mood, full affect Neuro- strength and sensation are intact  EKG-NSR at 78 bpm, PR  int 160 ms, qrs int 94 ms, qtc 392 ms Epic records reviewed    Assessment and Plan: 1. Paroxysmal afib Seen on LInq after stroke 2017 Afib very low burden  None seen on linq reports for some time Continue eliquis 5 mg bid for a CHA2DS2VASc score of at least 5, refills sent in  Appropriately dosed with normal creatinine  2. HTN Stable   F/u in one year  Travis [name]. Travis [name], [facility] [address] [place], [place] [address] [phone]

## 2018-06-01 NOTE — Procedures (Signed)
[facility]  NCS (NERVE CONDUCTION STUDY) WITH EMG (ELECTROMYOGRAPHY) REPORT   STUDY DATE: 05/25/18 PATIENT NAME: Travis [name] DOB: May 23, 1960 MRN: [id number]  ORDERING CLINICIAN: Lance Sell  TECHNOLOGIST: Sherre Scarlet ELECTROMYOGRAPHER: Earlean Polka. Swade Shonka, MD  CLINICAL INFORMATION: 58 year old male with left hand numbness.  FINDINGS: NERVE CONDUCTION STUDY:  Left median and left ulnar motor responses are normal.  Left median and left ulnar sensory sponsors have normal peak latencies and decreased amplitudes.  Left ulnar F wave latency is normal.   NEEDLE ELECTROMYOGRAPHY:  Needle examination of left upper extremity shows decreased motor unit recruitment in left deltoid, left biceps and left triceps muscles.  Occasional fasciculations noted in left deltoid.  Left flexor carpi radialis and left first dorsal interosseous muscles are unremarkable.  Cervical paraspinal muscles were deferred as patient is on anticoagulation.   IMPRESSION:   Abnormal study demonstrating: - Left median and left ulnar sensory responses are mildly abnormal, and suggest mild neuropathies at the left wrist. - In addition, there are chronic denervation in left deltoid, left biceps and left triceps muscles. These could be seen in a left cervical polyradiculopathy (C5, C6, C7). Further needle exam testing was limited due to patient anti-coagulation. Consider correlation with clinical and neuroimaging studies (MRI or CT cervical spine).       INTERPRETING PHYSICIAN:  Penni Bombard, MD Certified in Neurology, Neurophysiology and Neuroimaging  [facility] [address], [place], [place] [address] [phone]   [facility]    Nerve / Sites Muscle Latency Ref. Amplitude Ref. Rel Amp Segments Distance Velocity Ref. Area    ms ms mV mV %  cm m/s m/s mVms  L Median - APB     Wrist APB 3.5 ?4.4 5.9 ?4.0 100 Wrist - APB 7   23.0     Upper arm APB 8.2  5.5   92.4 Upper arm - Wrist 23 50 ?49 21.5  L Ulnar - ADM     Wrist ADM 3.0 ?3.3 9.7 ?6.0 100 Wrist - ADM 7   25.0     B.Elbow ADM 6.5  8.6  88.4 B.Elbow - Wrist 19 54 ?49 23.8     A.Elbow ADM 8.4  8.8  102 A.Elbow - B.Elbow 10 51 ?49 30.2         A.Elbow - Wrist             SNC    Nerve / Sites Rec. Site Peak Lat Ref.  Amp Ref. Segments Distance    ms ms V V  cm  L Median - Orthodromic (Dig II, Mid palm)     Dig II Wrist 3.1 ?3.4 4 ?10 Dig II - Wrist 13  L Ulnar - Orthodromic, (Dig V, Mid palm)     Dig V Wrist 3.1 ?3.1 4 ?5 Dig V - Wrist 54         F  Wave    Nerve F Lat Ref.   ms ms  L Ulnar - ADM 26.6 ?32.0       EMG full       EMG Summary Table    Spontaneous MUAP Recruitment  Muscle IA Fib PSW Fasc Other Amp Dur. Poly Pattern  L. Deltoid Normal None None Occasional _______ Increased Normal Normal Reduced  L. Biceps brachii Normal None None None _______ Normal Increased Normal Reduced  L. Triceps brachii Normal None None None _______ Increased Normal Normal Reduced  L. Flexor carpi radialis Normal None  None None _______ Normal Normal Normal Normal  L. First dorsal interosseous Normal None None None _______ Normal Normal Normal Normal

## 2018-06-05 MED FILL — UNIFINE PENTIPS 8MM 31G: 31G X 8 MM | 90 days supply | Qty: 100 | Fill #1 | Status: TO

## 2018-06-06 ENCOUNTER — Other Ambulatory Visit: Payer: Self-pay | Admitting: Family Medicine

## 2018-06-06 MED FILL — ACCU-CHEK GUIDE STRP: 50 days supply | Qty: 100 | Fill #0 | Status: TO

## 2018-06-06 MED FILL — ATORVASTATIN CALCIUM 20 MG: 20 | 90 days supply | Qty: 90 | Fill #0

## 2018-06-08 NOTE — Addendum Note (Signed)
Addended by: Karle Barr on: 06/08/2018 02:48 PM   Modules accepted: Orders

## 2018-06-12 ENCOUNTER — Other Ambulatory Visit: Payer: Self-pay | Admitting: Family Medicine

## 2018-06-12 MED FILL — MECLIZINE 25 MG TABLET: 25 | 10 days supply | Qty: 30 | Fill #0

## 2018-06-12 MED FILL — LANTUS SOLOSTAR 100 UNITS/M: 100 | 86 days supply | Qty: 30 | Fill #0 | Status: TO

## 2018-06-19 ENCOUNTER — Ambulatory Visit (INDEPENDENT_AMBULATORY_CARE_PROVIDER_SITE_OTHER): Payer: No Typology Code available for payment source

## 2018-06-19 DIAGNOSIS — I63443 Cerebral infarction due to embolism of bilateral cerebellar arteries: Secondary | ICD-10-CM | POA: Diagnosis not present

## 2018-06-19 LAB — CUP PACEART REMOTE DEVICE CHECK
Date Time Interrogation Session: 20200217023957
Implantable Pulse Generator Implant Date: 20161229

## 2018-06-19 MED FILL — FOLIC ACID 1 MG TABS: 1 | 90 days supply | Qty: 90 | Fill #1

## 2018-06-19 MED FILL — PREGABALIN 100 MG CAPS: 100 | 30 days supply | Qty: 90 | Fill #2

## 2018-06-20 MED FILL — ELIQUIS 5 MG TABLET: 5 | 30 days supply | Qty: 60 | Fill #0

## 2018-06-21 MED FILL — ALPRAZolam 0.25 MG TABS: 0.25 | 30 days supply | Qty: 60 | Fill #2

## 2018-06-28 NOTE — Progress Notes (Signed)
[facility] Summary Report / Loop Recorder 

## 2018-07-13 ENCOUNTER — Other Ambulatory Visit: Payer: Self-pay | Admitting: "Endocrinology

## 2018-07-13 MED FILL — ACCU-CHEK FASTCLIX LANCETS: 26 days supply | Qty: 102 | Fill #1 | Status: TO

## 2018-07-13 MED FILL — metFORMIN HCL 500 MG TABS: 500 | 90 days supply | Qty: 360 | Fill #0

## 2018-07-19 MED FILL — MECLIZINE 25 MG TABLET: 25 | 10 days supply | Qty: 30 | Fill #1 | Status: TO

## 2018-07-19 MED FILL — PREGABALIN 100 MG CAPS: 100 | 30 days supply | Qty: 90 | Fill #3 | Status: TO

## 2018-07-19 MED FILL — ELIQUIS 5 MG TABLET: 5 | 30 days supply | Qty: 60 | Fill #1 | Status: TO

## 2018-07-19 MED FILL — ALPRAZolam 0.25 MG TABS: 0.25 | 30 days supply | Qty: 60 | Fill #3 | Status: TO

## 2018-07-21 ENCOUNTER — Ambulatory Visit (INDEPENDENT_AMBULATORY_CARE_PROVIDER_SITE_OTHER): Payer: No Typology Code available for payment source | Admitting: *Deleted

## 2018-07-21 ENCOUNTER — Other Ambulatory Visit: Payer: Self-pay

## 2018-07-21 DIAGNOSIS — I63443 Cerebral infarction due to embolism of bilateral cerebellar arteries: Secondary | ICD-10-CM

## 2018-07-22 LAB — CUP PACEART REMOTE DEVICE CHECK
Date Time Interrogation Session: 20200321033735
Implantable Pulse Generator Implant Date: 20161229

## 2018-07-26 MED FILL — ACCU-CHEK GUIDE TEST STRIP: 50 days supply | Qty: 100 | Fill #0

## 2018-07-28 NOTE — Progress Notes (Signed)
[facility] Summary Report / Loop Recorder 

## 2018-08-09 MED FILL — ELIQUIS 5 MG TABLET: 5 | 30 days supply | Qty: 60 | Fill #0

## 2018-08-09 MED FILL — valACYclovir HCL 1 GM TABS: 1 | 90 days supply | Qty: 90 | Fill #0

## 2018-08-09 MED FILL — MECLIZINE 25 MG TABLET: 25 | 10 days supply | Qty: 30 | Fill #0

## 2018-08-15 MED FILL — ALPRAZolam 0.25 MG TABS: 0.25 | 30 days supply | Qty: 60 | Fill #0

## 2018-08-18 ENCOUNTER — Telehealth: Payer: Self-pay

## 2018-08-18 NOTE — Telephone Encounter (Signed)
Left message for patient to remind of missed remote transmission.  

## 2018-08-21 MED FILL — LANTUS SOLOSTAR 100 UNITS/M: 100 | 86 days supply | Qty: 30 | Fill #0

## 2018-08-23 ENCOUNTER — Ambulatory Visit (INDEPENDENT_AMBULATORY_CARE_PROVIDER_SITE_OTHER): Payer: No Typology Code available for payment source | Admitting: *Deleted

## 2018-08-23 ENCOUNTER — Other Ambulatory Visit: Payer: Self-pay

## 2018-08-23 DIAGNOSIS — I63443 Cerebral infarction due to embolism of bilateral cerebellar arteries: Secondary | ICD-10-CM | POA: Diagnosis not present

## 2018-08-24 LAB — CUP PACEART REMOTE DEVICE CHECK
Date Time Interrogation Session: 20200423101018
Implantable Pulse Generator Implant Date: 20161229

## 2018-08-31 MED FILL — ELIQUIS 5 MG TABLET: 5 | 30 days supply | Qty: 60 | Fill #1

## 2018-08-31 MED FILL — UNIFINE PENTIPS 8MM 31G: 31G X 8 MM | 90 days supply | Qty: 100 | Fill #0

## 2018-08-31 MED FILL — MECLIZINE 25 MG TABLET: 25 | 10 days supply | Qty: 30 | Fill #1

## 2018-08-31 MED FILL — PREGABALIN 100 MG CAPS: 100 | 30 days supply | Qty: 90 | Fill #0

## 2018-08-31 NOTE — Progress Notes (Signed)
[facility] Summary Report / Loop Recorder 

## 2018-09-08 ENCOUNTER — Other Ambulatory Visit: Payer: Self-pay | Admitting: Family Medicine

## 2018-09-08 MED FILL — ALPRAZolam 0.25 MG TABS: 0.25 | 30 days supply | Qty: 60 | Fill #1

## 2018-09-08 MED FILL — ATORVASTATIN 20 MG TABLET: 20 | 90 days supply | Qty: 90 | Fill #0

## 2018-09-11 MED FILL — FOLIC ACID 1 MG TABS: 1 | 90 days supply | Qty: 90 | Fill #0

## 2018-09-22 MED FILL — ACCU-CHEK GUIDE TEST STRIP: 50 days supply | Qty: 100 | Fill #1

## 2018-09-22 MED FILL — ACCU-CHEK FASTCLIX LANCETS: 26 days supply | Qty: 102 | Fill #0

## 2018-09-24 MED FILL — ELIQUIS 5 MG TABLET: 5 | 30 days supply | Qty: 60 | Fill #2

## 2018-09-26 ENCOUNTER — Ambulatory Visit (INDEPENDENT_AMBULATORY_CARE_PROVIDER_SITE_OTHER): Payer: No Typology Code available for payment source | Admitting: *Deleted

## 2018-09-26 DIAGNOSIS — I63443 Cerebral infarction due to embolism of bilateral cerebellar arteries: Secondary | ICD-10-CM

## 2018-09-26 DIAGNOSIS — I480 Paroxysmal atrial fibrillation: Secondary | ICD-10-CM

## 2018-09-27 LAB — CUP PACEART REMOTE DEVICE CHECK
Date Time Interrogation Session: 20200526100627
Implantable Pulse Generator Implant Date: 20161229

## 2018-10-06 NOTE — Progress Notes (Signed)
[facility] Summary Report / Loop Recorder 

## 2018-10-09 ENCOUNTER — Other Ambulatory Visit: Payer: Self-pay | Admitting: Family Medicine

## 2018-10-09 MED FILL — PREGABALIN 100 MG CAPS: 100 | 30 days supply | Qty: 90 | Fill #0

## 2018-10-11 ENCOUNTER — Other Ambulatory Visit: Payer: Self-pay | Admitting: Family Medicine

## 2018-10-12 ENCOUNTER — Other Ambulatory Visit: Payer: Self-pay | Admitting: Family Medicine

## 2018-10-12 MED FILL — ALPRAZolam 0.25 MG TABS: 0.25 | 30 days supply | Qty: 60 | Fill #0

## 2018-10-12 MED FILL — MECLIZINE 25 MG TABLET: 25 | 10 days supply | Qty: 30 | Fill #0

## 2018-10-12 NOTE — Telephone Encounter (Signed)
One mo ea ok

## 2018-10-26 ENCOUNTER — Encounter: Payer: Self-pay | Admitting: Family Medicine

## 2018-10-26 ENCOUNTER — Other Ambulatory Visit: Payer: Self-pay

## 2018-10-26 ENCOUNTER — Ambulatory Visit (INDEPENDENT_AMBULATORY_CARE_PROVIDER_SITE_OTHER): Payer: No Typology Code available for payment source | Admitting: Family Medicine

## 2018-10-26 VITALS — BP 126/80 | Temp 98.7°F | Ht 73.0 in | Wt 186.0 lb

## 2018-10-26 DIAGNOSIS — I480 Paroxysmal atrial fibrillation: Secondary | ICD-10-CM

## 2018-10-26 DIAGNOSIS — Z0000 Encounter for general adult medical examination without abnormal findings: Secondary | ICD-10-CM | POA: Diagnosis not present

## 2018-10-26 DIAGNOSIS — E1159 Type 2 diabetes mellitus with other circulatory complications: Secondary | ICD-10-CM

## 2018-10-26 DIAGNOSIS — I10 Essential (primary) hypertension: Secondary | ICD-10-CM | POA: Diagnosis not present

## 2018-10-26 DIAGNOSIS — Z125 Encounter for screening for malignant neoplasm of prostate: Secondary | ICD-10-CM

## 2018-10-26 NOTE — Patient Instructions (Addendum)
  May try Voltaren gel -OTC-apply small amount two times a day as needed for the right wrist pain  Shingrix and shingles prevention: know the facts!   Shingrix is a very effective vaccine to prevent shingles.   Shingles is a reactivation of chickenpox -more than 99% of Americans born before 1980 have had chickenpox even if they do not remember it. One in every 10 people who get shingles have severe long-lasting nerve pain as a result.   33 out of a 100 older adults will get shingles if they are unvaccinated.     This vaccine is very important for your health This vaccine is indicated for anyone 50 years or older. You can get this vaccine even if you have already had shingles because you can get the disease more than once in a lifetime.  Your risk for shingles and its complications increases with age.  This vaccine has 2 doses.  The second dose would be 2 to 6 months after the first dose.  If you had Zostavax vaccine in the past you should still get Shingrix. ( Zostavax is only 70% effective and it loses significant strength over a few years .)  This vaccine is given through the pharmacy.  The cost of the vaccine is through your insurance. The pharmacy can inform you of the total costs.  Common side effects including soreness in the arm, some redness and swelling, also some feel fatigue muscle soreness headache low-grade fever.  Side effects typically go away within 2 to 3 days. Remember-the pain from shingles can last a lifetime but these side effects of the vaccine will only last a few days at most. It is very important to get both doses in order to protect yourself fully.   Please get this vaccine at your earliest convenience at your trusted pharmacy.

## 2018-10-26 NOTE — Progress Notes (Signed)
Subjective:    Patient ID: Travis [name], male    DOB: [birth year], 58 y.o.   MRN: [id number]  HPI The patient comes in today for a wellness visit.  Overall nice patient has some underlying health issues including previous strokes as well as atrial fibrillation as well as diabetes and at times it is subpar control.  He states he is trying to do the best he can at trying to take his medications watch his diet and stay active  Patient here for follow-up regarding cholesterol.  The patient does have hyperlipidemia.  Patient does try to maintain a reasonable diet.  Patient does take the medication on a regular basis.  Denies missing a dose.  The patient denies any obvious side effects.  Prior blood work results reviewed with the patient.  The patient is aware of his cholesterol goals and the need to keep it under good control to lessen the risk of disease.    A review of their health history was completed.  A review of medications was also completed.  Any needed refills; none  Eating habits: most of the time healthy diet  Falls/  MVA accidents in past few months: none  Regular exercise: yard work and Oncologist pt sees on regular basis: none  Preventative health issues were discussed.   Additional concerns: right hand pain.      Review of Systems  Constitutional: Negative for activity change, appetite change and fever.  HENT: Negative for congestion and rhinorrhea.   Eyes: Negative for discharge.  Respiratory: Negative for cough and wheezing.   Cardiovascular: Negative for chest pain.  Gastrointestinal: Negative for abdominal pain, blood in stool and vomiting.  Genitourinary: Negative for difficulty urinating and frequency.  Musculoskeletal: Negative for neck pain.  Skin: Negative for rash.  Allergic/Immunologic: Negative for environmental allergies and food allergies.  Neurological: Negative for weakness and headaches.  Psychiatric/Behavioral: Negative for  agitation.       Objective:   Physical Exam Constitutional:      Appearance: He is well-developed.  HENT:     Head: Normocephalic and atraumatic.     Right Ear: External ear normal.     Left Ear: External ear normal.     Nose: Nose normal.  Eyes:     Pupils: Pupils are equal, round, and reactive to light.  Neck:     Musculoskeletal: Normal range of motion and neck supple.     Thyroid: No thyromegaly.  Cardiovascular:     Rate and Rhythm: Normal rate and regular rhythm.     Heart sounds: Normal heart sounds. No murmur.  Pulmonary:     Effort: Pulmonary effort is normal. No respiratory distress.     Breath sounds: Normal breath sounds. No wheezing.  Abdominal:     General: Bowel sounds are normal. There is no distension.     Palpations: Abdomen is soft. There is no mass.     Tenderness: There is no abdominal tenderness.  Genitourinary:    Penis: Normal.   Musculoskeletal: Normal range of motion.  Lymphadenopathy:     Cervical: No cervical adenopathy.  Skin:    General: Skin is warm and dry.     Findings: No erythema.  Neurological:     Mental Status: He is alert.     Motor: No abnormal muscle tone.  Psychiatric:        Behavior: Behavior normal.        Judgment: Judgment normal.  Assessment & Plan:  His right hand pain is from previous injury and surgery I believe the patient would benefit from Voltaren gel over-the-counter that he can try  Vernard Gambles Grix vaccine recommended patient will consider  Adult wellness-complete.wellness physical was conducted today. Importance of diet and exercise were discussed in detail.  In addition to this a discussion regarding safety was also covered. We also reviewed over immunizations and gave recommendations regarding current immunization needed for age.  In addition to this additional areas were also touched on including: Preventative health exams needed:  Colonoscopy 2027  Patient was advised yearly wellness exam   The patient was seen today as part of an evaluation regarding hyperlipidemia.  Recent lab work has been reviewed with the patient as well as the goals for good cholesterol care.  In addition to this medications have been discussed the importance of compliance with diet and medications discussed as well.  Finally the patient is aware that poor control of cholesterol, noncompliance can dramatically increase the risk of complications. The patient will keep regular office visits and the patient does agreed to periodic lab work. Prostate exam normal

## 2018-10-29 LAB — CUP PACEART REMOTE DEVICE CHECK
Date Time Interrogation Session: 20200628103719
Implantable Pulse Generator Implant Date: 20161229

## 2018-10-30 ENCOUNTER — Ambulatory Visit (INDEPENDENT_AMBULATORY_CARE_PROVIDER_SITE_OTHER): Payer: No Typology Code available for payment source | Admitting: *Deleted

## 2018-10-30 DIAGNOSIS — I6340 Cerebral infarction due to embolism of unspecified cerebral artery: Secondary | ICD-10-CM | POA: Diagnosis not present

## 2018-11-02 ENCOUNTER — Telehealth: Payer: Self-pay

## 2018-11-02 ENCOUNTER — Other Ambulatory Visit: Payer: Self-pay | Admitting: Family Medicine

## 2018-11-02 ENCOUNTER — Other Ambulatory Visit: Payer: Self-pay | Admitting: "Endocrinology

## 2018-11-02 MED FILL — ACCU-CHEK GUIDE TEST STRIP: 50 days supply | Qty: 100 | Fill #2

## 2018-11-02 MED FILL — LANTUS SOLOSTAR 100 UNITS/M: 100 | 86 days supply | Qty: 30 | Fill #1

## 2018-11-02 MED FILL — metFORMIN HCL 500 MG TABS: 500 | 30 days supply | Qty: 120 | Fill #0

## 2018-11-02 MED FILL — ELIQUIS 5 MG TABLET: 5 | 30 days supply | Qty: 60 | Fill #3

## 2018-11-02 MED FILL — ACCU-CHEK FASTCLIX LANCETS: 26 days supply | Qty: 102 | Fill #1

## 2018-11-02 MED FILL — valACYclovir HCL 1 GM TABS: 1 | 90 days supply | Qty: 90 | Fill #1

## 2018-11-02 MED FILL — MECLIZINE 25 MG TABLET: 25 | 10 days supply | Qty: 30 | Fill #0

## 2018-11-02 NOTE — Telephone Encounter (Signed)
LMOVM regarding LINQ alert for RRT 10/31/18.

## 2018-11-05 MED FILL — PREGABALIN 100 MG CAPS: 100 | 30 days supply | Qty: 90 | Fill #1

## 2018-11-07 NOTE — Telephone Encounter (Signed)
Spoke with pt wife about pt reaching RRT. I let the pt wife speak with Sheran Luz.

## 2018-11-07 NOTE — Telephone Encounter (Signed)
Spoke with pt's wife. Discussed various options, she is unsure if pt will want to have LINQ removed. Agreeable to receiving return kit for monitor. Unenrolled from [place], return kit ordered to home address on file. Pt's wife aware to call back if pt wishes to schedule appointment. She denies further questions or concerns at this time.

## 2018-11-09 NOTE — Progress Notes (Signed)
[facility] Summary Report / Loop Recorder 

## 2018-11-10 ENCOUNTER — Telehealth: Payer: Self-pay | Admitting: Family Medicine

## 2018-11-10 NOTE — Telephone Encounter (Signed)
Travis [name] needs a letter for this year for daycare stating Travis [name] can not take care of their grandchild due to his health.  If ok, I can just copy and past last year's letter and change the date to this year?

## 2018-11-11 NOTE — Telephone Encounter (Signed)
That would be fine but please add that the patient Travis [name] is at high risk of COVID complications and therefore cannot take care of her grandchildren

## 2018-11-13 ENCOUNTER — Encounter: Payer: Self-pay | Admitting: Family Medicine

## 2018-11-13 NOTE — Telephone Encounter (Signed)
Letter printed and wife notified.

## 2018-11-14 ENCOUNTER — Telehealth: Payer: Self-pay

## 2018-11-14 ENCOUNTER — Telehealth: Payer: Self-pay | Admitting: "Endocrinology

## 2018-11-14 DIAGNOSIS — I10 Essential (primary) hypertension: Secondary | ICD-10-CM

## 2018-11-14 DIAGNOSIS — E1159 Type 2 diabetes mellitus with other circulatory complications: Secondary | ICD-10-CM

## 2018-11-14 NOTE — Telephone Encounter (Signed)
Done

## 2018-11-14 NOTE — Telephone Encounter (Signed)
Travis [name], CMA  

## 2018-11-14 NOTE — Telephone Encounter (Signed)
Patient needs updated lab order

## 2018-11-15 ENCOUNTER — Other Ambulatory Visit: Payer: Self-pay | Admitting: "Endocrinology

## 2018-11-15 ENCOUNTER — Telehealth: Payer: Self-pay | Admitting: Family Medicine

## 2018-11-15 DIAGNOSIS — E1159 Type 2 diabetes mellitus with other circulatory complications: Secondary | ICD-10-CM

## 2018-11-15 NOTE — Telephone Encounter (Signed)
Pharmacy requesting refill on Alpraxolam 0.25 mg tablet. Take one tablet by mouth twice daily prn. Please advise. Thank you

## 2018-11-16 ENCOUNTER — Other Ambulatory Visit: Payer: Self-pay

## 2018-11-16 ENCOUNTER — Other Ambulatory Visit: Payer: Self-pay | Admitting: Family Medicine

## 2018-11-16 ENCOUNTER — Encounter: Payer: No Typology Code available for payment source | Attending: Family Medicine | Admitting: Nutrition

## 2018-11-16 ENCOUNTER — Ambulatory Visit: Payer: No Typology Code available for payment source | Admitting: "Endocrinology

## 2018-11-16 DIAGNOSIS — E118 Type 2 diabetes mellitus with unspecified complications: Secondary | ICD-10-CM | POA: Insufficient documentation

## 2018-11-16 DIAGNOSIS — I639 Cerebral infarction, unspecified: Secondary | ICD-10-CM | POA: Insufficient documentation

## 2018-11-16 DIAGNOSIS — E1165 Type 2 diabetes mellitus with hyperglycemia: Secondary | ICD-10-CM | POA: Insufficient documentation

## 2018-11-16 DIAGNOSIS — E1121 Type 2 diabetes mellitus with diabetic nephropathy: Secondary | ICD-10-CM | POA: Insufficient documentation

## 2018-11-16 DIAGNOSIS — IMO0002 Reserved for concepts with insufficient information to code with codable children: Secondary | ICD-10-CM

## 2018-11-16 DIAGNOSIS — E1142 Type 2 diabetes mellitus with diabetic polyneuropathy: Secondary | ICD-10-CM | POA: Insufficient documentation

## 2018-11-16 MED ORDER — ALPRAZOLAM 0.25 MG PO TABS: ORAL_TABLET | ORAL | 2 refills | Status: DC

## 2018-11-16 MED FILL — ALPRAZolam 0.25 MG TABS: 0.25 | 30 days supply | Qty: 60 | Fill #0

## 2018-11-16 NOTE — Patient Instructions (Signed)
Goals Keep up the great job Keep FBS less than 130 and bedtime less than 150 mg/dl Keep eating more fresh fruits and vegetables Get A1C down to 7%

## 2018-11-16 NOTE — Telephone Encounter (Signed)
Refills were sent message can be closed

## 2018-11-16 NOTE — Progress Notes (Signed)
  Medical Nutrition Therapy:  Appt start time: 8366   end time: 1545   Assessment:  [place] visit.     Primary concerns today: Type 2 DM. Lives with his wife. BS in Am 100-140's  Evenings: 150's Eating meals on time, cutting out snacks. Still drinking on diet soda some and drinking more water.  Preferred Learning Style:   Auditory  Hands on  Learning Readiness:   Ready  Change in progress   MEDICATIONS: See list   DIETARY INTAKE:  24-hr recall:  B ( AM): Turkeys sausage,  Snk ( AM):  L ( PM): chicken salad, fruit,  Water, or Snk ( PM): almonds  D ( PM): Toss salad, [place]  Snk ( PM): water, Beverages water  Usual physical activity: yard work.  Estimated energy needs: 2000  calories 225 g carbohydrates 150 g protein 56 g fat  Progress Towards Goal(s):  In progress.   Nutritional Diagnosis:  NB-1.1 Food and nutrition-related knowledge deficit As related to Diabetes Type 2.  As evidenced by A1C >14%.    Intervention:  Nutrition and Diabetes education provided on My Plate, CHO counting, meal planning, portion sizes, timing of meals, avoiding snacks between meals unless having a low blood sugar, target ranges for A1C and blood sugars, signs/symptoms and treatment of hyper/hypoglycemia, monitoring blood sugars, taking medications as prescribed, benefits of exercising 30 minutes per day and prevention of complications of DM.   Goals Keep up the great job Keep FBS less than 130 and bedtime less than 150 mg/dl Keep eating more fresh fruits and vegetables Get A1C down to 7%   Teaching Method Utilized:  Visual Auditory Hands on  Handouts given during visit include:  The Plate Method   Meal Plan Card  Diabetes Instructions.   Barriers to learning/adherence to lifestyle change: none  Demonstrated degree of understanding via:  Teach Back   Monitoring/Evaluation:  Dietary intake, exercise, meal planning,SBG, and body weight in 3 month(s).

## 2018-11-29 ENCOUNTER — Other Ambulatory Visit: Payer: Self-pay | Admitting: Family Medicine

## 2018-11-29 ENCOUNTER — Other Ambulatory Visit: Payer: Self-pay | Admitting: "Endocrinology

## 2018-11-29 MED FILL — FOLIC ACID 1 MG TABS: 1 | 90 days supply | Qty: 90 | Fill #1

## 2018-11-29 MED FILL — ELIQUIS 5 MG TABLET: 5 | 30 days supply | Qty: 60 | Fill #4

## 2018-11-29 MED FILL — ATORVASTATIN 20 MG TABLET: 20 | 90 days supply | Qty: 90 | Fill #0

## 2018-11-29 MED FILL — metFORMIN HCL 500 MG TABS: 500 | 15 days supply | Qty: 60 | Fill #0

## 2018-12-01 MED FILL — PREGABALIN 100 MG CAPS: 100 | 30 days supply | Qty: 90 | Fill #0

## 2018-12-07 ENCOUNTER — Ambulatory Visit: Payer: No Typology Code available for payment source | Admitting: "Endocrinology

## 2018-12-09 LAB — LIPID PANEL
Chol/HDL Ratio: 2.8 ratio (ref 0.0–5.0)
Cholesterol, Total: 95 mg/dL — ABNORMAL LOW (ref 100–199)
HDL: 34 mg/dL — ABNORMAL LOW (ref >39)
LDL Calculated: 43 mg/dL (ref 0–99)
Triglycerides: 91 mg/dL (ref 0–149)
VLDL Cholesterol Cal: 18 mg/dL (ref 5–40)

## 2018-12-09 LAB — BASIC METABOLIC PANEL
BUN/Creatinine Ratio: 11 (ref 10–24)
BUN: 10 mg/dL (ref 8–27)
CO2: 24 mmol/L (ref 20–29)
Calcium: 9.2 mg/dL (ref 8.6–10.2)
Chloride: 102 mmol/L (ref 96–106)
Creatinine, Ser: 0.93 mg/dL (ref 0.76–1.27)
GFR calc Af Amer: 103 mL/min/{1.73_m2} (ref >59)
GFR calc non Af Amer: 89 mL/min/{1.73_m2} (ref >59)
Glucose: 155 mg/dL — ABNORMAL HIGH (ref 65–99)
Potassium: 4.4 mmol/L (ref 3.5–5.2)
Sodium: 139 mmol/L (ref 134–144)

## 2018-12-09 LAB — HEMOGLOBIN A1C
Est. average glucose Bld gHb Est-mCnc: 174 mg/dL
Hgb A1c MFr Bld: 7.7 % — ABNORMAL HIGH (ref 4.8–5.6)

## 2018-12-09 LAB — PSA: Prostate Specific Ag, Serum: 1.3 ng/mL (ref 0.0–4.0)

## 2018-12-09 LAB — HEPATIC FUNCTION PANEL
ALT: 7 IU/L (ref 0–44)
AST: 16 IU/L (ref 0–40)
Albumin: 4.3 g/dL (ref 3.8–4.9)
Alkaline Phosphatase: 68 IU/L (ref 39–117)
Bilirubin Total: 0.4 mg/dL (ref 0.0–1.2)
Bilirubin, Direct: 0.12 mg/dL (ref 0.00–0.40)
Total Protein: 6.6 g/dL (ref 6.0–8.5)

## 2018-12-09 LAB — MICROALBUMIN / CREATININE URINE RATIO
Creatinine, Urine: 74.9 mg/dL
Microalb/Creat Ratio: 12 mg/g creat (ref 0–29)
Microalbumin, Urine: 9 ug/mL

## 2018-12-12 ENCOUNTER — Encounter: Payer: Self-pay | Admitting: Nutrition

## 2018-12-14 ENCOUNTER — Encounter: Payer: Self-pay | Admitting: "Endocrinology

## 2018-12-14 ENCOUNTER — Other Ambulatory Visit: Payer: Self-pay

## 2018-12-14 ENCOUNTER — Ambulatory Visit (INDEPENDENT_AMBULATORY_CARE_PROVIDER_SITE_OTHER): Payer: No Typology Code available for payment source | Admitting: "Endocrinology

## 2018-12-14 DIAGNOSIS — E673 Hypervitaminosis D: Secondary | ICD-10-CM

## 2018-12-14 DIAGNOSIS — E782 Mixed hyperlipidemia: Secondary | ICD-10-CM | POA: Diagnosis not present

## 2018-12-14 DIAGNOSIS — E1159 Type 2 diabetes mellitus with other circulatory complications: Secondary | ICD-10-CM | POA: Diagnosis not present

## 2018-12-14 NOTE — Progress Notes (Signed)
12/14/2018, 1:50 PM                                                    Endocrinology Telehealth Visit Follow up Note -During COVID -19 Pandemic  This visit type was conducted due to national recommendations for restrictions regarding the COVID-19 Pandemic  in an effort to limit this patient's exposure and mitigate transmission of the corona virus.  Due to his co-morbid illnesses, Travis [name] is at  moderate to high risk for complications without adequate follow up.  This format is felt to be most appropriate for him at this time.  I connected with this patient on 12/14/2018   by telephone and verified that I am speaking with the correct person using two identifiers. Travis [name], 1961/04/16. he has verbally consented to this visit. All issues noted in this document were discussed and addressed. The format was not optimal for physical exam.    Subjective:    Patient ID: Travis [name], male    DOB: [birth year].  Travis [name] is being gauge in telehealth via telephone for follow-up  for management of currently uncontrolled symptomatic diabetes requested by  Kathyrn Drown, MD.   Past Medical History:  Diagnosis Date  . A-fib ([place]) 02/2016   found on loop recorder  . 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 Anxiety   . Arthritis   . Blood transfusion without reported diagnosis   . Chronic headaches   . Complication of anesthesia    bleed after last shoulder-had to stay overnight  . Depression   . Diabetes mellitus   . Diabetic peripheral neuropathy ([place]) 03/28/2013  . Diverticulosis   . Dizziness   . Hernia, inguinal   . Hyperlipidemia   . Hypertension   . Impingement syndrome of left shoulder 08/02/2014  . 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 Neuropathy of lower extremity   . Night sweats    every once in a while  . Sleep apnea    no CPAP  . Stroke ([place]) 02/2015  . Syncope and collapse    Past  Surgical History:  Procedure Laterality Date  . CERVICAL DISC SURGERY    . COLON SURGERY  2003   1/3 removed for diverticulitis  . COLONOSCOPY    . EP IMPLANTABLE DEVICE N/A 05/01/2015   Procedure: Loop Recorder Insertion;  Surgeon: [name], MD;  Location: [facility];  Service: Cardiovascular;  Laterality: N/A;  . INGUINAL HERNIA REPAIR Bilateral   . KNEE ARTHROSCOPY Right   . SHOULDER ARTHROSCOPY Right    1  . SHOULDER ARTHROSCOPY Left 08/02/2014   Procedure: LEFT SHOULDER SCOPE DEBRIDEMENT/ACROMIOPLASTY;  Surgeon: [name], MD;  Location: [facility];  Service: Orthopedics;  Laterality: Left;  ANESTHESIA: GENERAL, PRE/POST OP SCALENE  . TEE WITHOUT CARDIOVERSION N/A 03/03/2015   Procedure: TRANSESOPHAGEAL ECHOCARDIOGRAM (TEE);  Surgeon: [name], MD;  [facility];  Service: Cardiology;  Laterality: N/A;  . UMBILICAL HERNIA REPAIR     with other hernia repair with mesh  .  WRIST SURGERY Right    fusion   Social History   Socioeconomic History  . Marital status: Married    Spouse name: Not on file  . Number of children: 2  . Years of education: College  . Highest education level: Not on file  Occupational History  . Occupation: disabled  Social Needs  . Financial resource strain: Not on file  . Food insecurity    Worry: Not on file    Inability: Not on file  . Transportation needs    Medical: Not on file    Non-medical: Not on file  Tobacco Use  . Smoking status: Former Smoker    Packs/day: 1.00    Years: 8.00    Pack years: 8.00    Types: Cigarettes    Start date: 06/07/1970    Quit date: 05/03/1978    Years since quitting: 40.6  . Smokeless tobacco: Former Systems developer    Types: [place] date: 05/03/1978  Substance and Sexual Activity  . Alcohol use: No    Alcohol/week: 1.0 standard drinks    Types: 1 Cans of beer per week    Comment: h/o heavy use in the past  . Drug use: No  . Sexual activity: Not on file   Lifestyle  . Physical activity    Days per week: Not on file    Minutes per session: Not on file  . Stress: Not on file  Relationships  . Social Herbalist on phone: Not on file    Gets together: Not on file    Attends religious service: Not on file    Active member of club or organization: Not on file    Attends meetings of clubs or organizations: Not on file    Relationship status: Not on file  Other Topics Concern  . Not on file  Social History Narrative   Drinks some coffee, Drink diet sodas and tea   Outpatient Encounter Medications as of 12/14/2018  Medication Sig  . ACCU-CHEK FASTCLIX LANCETS MISC 1 Dose by Does not apply route 4 (four) times daily.  Marland Kitchen ALPRAZolam (XANAX) 0.25 MG tablet TAKE 1 TABLET BY MOUTH 2 TIMES DAILY AS NEEDED.  Marland Kitchen apixaban (ELIQUIS) 5 MG TABS tablet Take 1 tablet (5 mg total) by mouth 2 (two) times daily.  Marland Kitchen aspirin EC 81 MG tablet Take 1 tablet (81 mg total) by mouth daily.  Marland Kitchen atorvastatin (LIPITOR) 20 MG tablet TAKE 1 TABLET (20 MG TOTAL) BY MOUTH DAILY.  Marland Kitchen Blood Glucose Monitoring Suppl (BLOOD GLUCOSE MONITOR SYSTEM) w/Device KIT Test glucose 2 time daily.  Meter/strips/lancets. Please dispense per patient/insurance preferrence  . Cholecalciferol 50 MCG (2000 UT) CAPS Take 1 capsule (2,000 Units total) by mouth daily with breakfast.  . folic acid (FOLVITE) 1 MG tablet TAKE 1 TABLET BY MOUTH DAILY.  Marland Kitchen Insulin Glargine (LANTUS) 100 UNIT/ML Solostar Pen Inject 35 Units into the skin at bedtime.  . meclizine (ANTIVERT) 25 MG tablet TAKE 1 TABLET BY MOUTH 3 TIMES DAILY AS NEEDED FOR DIZZINESS.  . metFORMIN (GLUCOPHAGE) 500 MG tablet TAKE 2 TABLETS BY MOUTH TWICE A DAY  . Multiple Vitamin (MULTIVITAMIN WITH MINERALS) TABS tablet Take 1 tablet daily by mouth.  . pregabalin (LYRICA) 100 MG capsule TAKE 1 CAPSULE BY MOUTH 3 TIMES DAILY  . UNIFINE PENTIPS 31G X 8 MM MISC USE AS DIRECTED WITH LEVEMIR INJECTIONS DAILY  . valACYclovir (VALTREX) 1000 MG  tablet TAKE 1 TABLET BY MOUTH ONCE  DAILY   No facility-administered encounter medications on file as of 12/14/2018.     ALLERGIES: Allergies  Allergen Reactions  . Lisinopril Cough    Patient/spouse is not aware/familiar with why this is listed as an allergy    VACCINATION STATUS: Immunization History  Administered Date(s) Administered  . Influenza Split 03/28/2013  . Influenza,inj,Quad PF,6+ Mos 02/18/2014, 01/23/2015, 02/06/2016, 02/23/2017, 02/24/2018  . Influenza-Unspecified 01/02/2011  . Pneumococcal Polysaccharide-23 01/31/2010, 03/01/2015  . Tdap 02/18/2014    Diabetes He presents for his follow-up diabetic visit. He has type 2 diabetes mellitus. Onset time: He was diagnosed at approximate age of 26 years. His disease course has been stable (He has prior history of heavy alcohol use/abuse.). There are no hypoglycemic associated symptoms. Pertinent negatives for hypoglycemia include no confusion, headaches, pallor or seizures. Pertinent negatives for diabetes include no chest pain, no fatigue, no polydipsia, no polyphagia, no polyuria and no weakness. There are no hypoglycemic complications. Symptoms are improving. Diabetic complications include a CVA. Risk factors for coronary artery disease include dyslipidemia, diabetes mellitus, hypertension, family history, male sex and tobacco exposure. Current diabetic treatment includes insulin injections and oral agent (monotherapy). He is compliant with treatment most of the time. His weight is increasing steadily. He is following a generally unhealthy diet. When asked about meal planning, he reported none. He has had a previous visit with a dietitian. He participates in exercise intermittently. His breakfast blood glucose range is generally 180-200 mg/dl. His overall blood glucose range is 180-200 mg/dl. (He returns with average blood glucose of 179 over the last 30 days.  His A1c is 8.5%, slowly improving.) An ACE inhibitor/angiotensin II  receptor blocker is contraindicated.  Hyperlipidemia This is a chronic problem. The current episode started more than 1 year ago. The problem is controlled. Recent lipid tests were reviewed and are normal. Exacerbating diseases include diabetes. Factors aggravating his hyperlipidemia include smoking. Pertinent negatives include no chest pain, myalgias or shortness of breath. Current antihyperlipidemic treatment includes statins. The current treatment provides moderate improvement of lipids. Risk factors for coronary artery disease include dyslipidemia, diabetes mellitus, hypertension, male sex, family history and a sedentary lifestyle.  Hypertension This is a chronic problem. The current episode started more than 1 year ago. The problem is controlled. Pertinent negatives include no chest pain, headaches, neck pain, palpitations or shortness of breath. Risk factors for coronary artery disease include dyslipidemia, diabetes mellitus, family history, male gender, sedentary lifestyle and smoking/tobacco exposure. Hypertensive end-organ damage includes CVA.    Review of Systems  Constitutional: Negative for chills, fatigue, fever and unexpected weight change.  HENT: Negative for dental problem, mouth sores and trouble swallowing.   Eyes: Negative for visual disturbance.  Respiratory: Negative for cough, choking, chest tightness, shortness of breath and wheezing.   Cardiovascular: Negative for chest pain, palpitations and leg swelling.  Gastrointestinal: Negative for abdominal distention, abdominal pain, constipation, diarrhea, nausea and vomiting.  Endocrine: Negative for polydipsia, polyphagia and polyuria.  Genitourinary: Negative for dysuria, flank pain, hematuria and urgency.  Musculoskeletal: Negative for back pain, gait problem, myalgias and neck pain.  Skin: Negative for pallor, rash and wound.  Neurological: Negative for seizures, syncope, weakness, numbness and headaches.   Psychiatric/Behavioral: Negative.  Negative for confusion and dysphoric mood.    Objective:    There were no vitals taken for this visit.  Wt Readings from Last 3 Encounters:  10/26/18 186 lb (84.4 kg)  05/31/18 180 lb 9.6 oz (81.9 kg)  05/17/18 181 lb (82.1 kg)  CMP ( most recent) CMP     Component Value Date/Time   NA 139 12/08/2018 1023   K 4.4 12/08/2018 1023   CL 102 12/08/2018 1023   CO2 24 12/08/2018 1023   GLUCOSE 155 (H) 12/08/2018 1023   GLUCOSE 403 (H) 03/15/2017 1544   BUN 10 12/08/2018 1023   CREATININE 0.93 12/08/2018 1023   CREATININE 0.82 02/16/2014 1053   CALCIUM 9.2 12/08/2018 1023   PROT 6.6 12/08/2018 1023   ALBUMIN 4.3 12/08/2018 1023   AST 16 12/08/2018 1023   ALT 7 12/08/2018 1023   ALKPHOS 68 12/08/2018 1023   BILITOT 0.4 12/08/2018 1023   GFRNONAA 89 12/08/2018 1023   GFRAA 103 12/08/2018 1023     Diabetic Labs (most recent): Lab Results  Component Value Date   HGBA1C 7.7 (H) 12/08/2018   HGBA1C 8.5 (H) 04/22/2018   HGBA1C 8.6 (H) 01/24/2018     Lipid Panel ( most recent) Lipid Panel     Component Value Date/Time   CHOL 95 (L) 12/08/2018 1023   TRIG 91 12/08/2018 1023   HDL 34 (L) 12/08/2018 1023   CHOLHDL 2.8 12/08/2018 1023   CHOLHDL 3.2 05/03/2016 0545   VLDL 16 05/03/2016 0545   LDLCALC 43 12/08/2018 1023      Lab Results  Component Value Date   TSH 1.960 03/12/2017   TSH 1.460 10/07/2016   TSH 0.720 05/02/2016   TSH 1.895 10/14/2014   FREET4 1.21 10/07/2016      Assessment & Plan:   1. DM type 2 causing vascular disease ([place]) - Travis [name] has currently uncontrolled symptomatic type 2 DM since  58 years of age. He reports near target glycemic profile both fasting and postprandial.  His previsit labs show A1c of 7.7%, overall improving from 14.6%.   -his diabetes is complicated by CVA, history of smoking, history of heavy alcohol use/abuse in the past and ALFIO LOESCHER remains at a high risk for more  acute and chronic complications which include CAD, CVA, CKD, retinopathy, and neuropathy. These are all discussed in detail with the patient.  - I have counseled him on diet management  by adopting a carbohydrate restricted/protein rich diet.  - he  admits there is a room for improvement in his diet and drink choices. -  Suggestion is made for him to avoid simple carbohydrates  from his diet including Cakes, Sweet Desserts / Pastries, Ice Cream, Soda (diet and regular), Sweet Tea, Candies, Chips, Cookies, Sweet Pastries,  Store Bought Juices, Alcohol in Excess of  1-2 drinks a day, Artificial Sweeteners, Coffee Creamer, and "Sugar-free" Products. This will help patient to have stable blood glucose profile and potentially avoid unintended weight gain.   - I encouraged him to switch to  unprocessed or minimally processed complex starch and increased protein intake (animal or plant source), fruits, and vegetables.  - he is advised to stick to a routine mealtimes to eat 3 meals  a day and avoid unnecessary snacks ( to snack only to correct hypoglycemia).   - He is already following with Kieth Brightly crumpton, CDE.  - I have approached him with the following individualized plan to manage diabetes and patient agrees:   - There could be a component of pancreatic endocrine and exocrine insufficiency given his prior history of heavy alcohol use, which would make it necessary for him to eventually need insulin basal/bolus in order for him to control diabetes to target.    -#1 priority in this patient  is to avoid hypoglycemia.   -He is advised to continue Lantus 35 units at bedtime,  associated with strict monitoring of blood glucose 2 times a day before breakfast and at bedtime.  -Patient is encouraged to call clinic for blood glucose levels less than 70 or above 300 mg /dl. -Is advised to continue metformin 500 mg p.o.  twice a day with meals, therapeutically suitable for patient .  -Patient is not a  candidate for SGLT2 inhibitors, nor incretin therapy due to his body habitus.  - Patient specific target  A1c;  LDL, HDL, Triglycerides, and  Waist Circumference were discussed in detail.  2) BP/HTN:  His records indicate intolerance to lisinopril. His blood pressure is controlled to target at this time. 3) Lipids/HPL: His recent lipid panel showed controlled  LDL at 40.  He is advised to continue atorvastatin 20 mg p.o. nightly.     4)  Weight/Diet: CDE Consult  is in progress , exercise, and detailed carbohydrates information provided.  Weight loss is not advisable for him.  If he continues to lose weight unintentionally, he will be considered for Creon therapy.   5) Chronic Care/Health Maintenance:  -he  is on Statin medications and  is encouraged to continue to follow up with Ophthalmology, Dentist,  Podiatrist at least yearly or according to recommendations, and advised to  stay away from smoking. I have recommended yearly flu vaccine and pneumonia vaccination at least every 5 years; moderate intensity exercise for up to 150 minutes weekly; and  sleep for at least 7 hours a day.  - I advised patient to maintain close follow up with Kathyrn Drown, MD for primary care needs.  - Patient Care Time Today:  25 min, of which >50% was spent in  counseling and the rest reviewing his  current and  previous labs/studies, previous treatments, his blood glucose readings, and medications' doses and developing a plan for long-term care based on the latest recommendations for standards of care.   Travis [name] participated in the discussions, expressed understanding, and voiced agreement with the above plans.  All questions were answered to his satisfaction. he is encouraged to contact clinic should he have any questions or concerns prior to his return visit.   Follow up plan: - No follow-ups on file.  Glade Lloyd, MD [facility] [address] [place], [place] [address] Phone: [phone]  Fax: [phone]    12/14/2018, 1:50 PM  This note was partially dictated with voice recognition software. Similar sounding words can be transcribed inadequately or may not  be corrected upon review.

## 2018-12-15 MED FILL — ALPRAZolam 0.25 MG TABS: 0.25 | 30 days supply | Qty: 60 | Fill #0

## 2018-12-21 ENCOUNTER — Other Ambulatory Visit: Payer: Self-pay | Admitting: "Endocrinology

## 2018-12-21 MED FILL — metFORMIN HCL 500 MG TABS: 500 | 15 days supply | Qty: 60 | Fill #0

## 2018-12-27 ENCOUNTER — Other Ambulatory Visit: Payer: Self-pay | Admitting: Family Medicine

## 2018-12-27 MED FILL — ACCU-CHEK FASTCLIX LANCETS: 90 days supply | Qty: 204 | Fill #0

## 2018-12-27 MED FILL — PREGABALIN 100 MG CAPS: 100 | 30 days supply | Qty: 90 | Fill #1

## 2018-12-27 MED FILL — UNIFINE PENTIPS 8MM 31G: 31G X 8 MM | 90 days supply | Qty: 100 | Fill #1

## 2018-12-27 MED FILL — ELIQUIS 5 MG TABLET: 5 | 30 days supply | Qty: 60 | Fill #5

## 2018-12-27 MED FILL — MECLIZINE 25 MG TABLET: 25 | 10 days supply | Qty: 30 | Fill #0

## 2018-12-27 MED FILL — ACCU-CHEK GUIDE TEST STRIP: 50 days supply | Qty: 100 | Fill #3

## 2019-01-10 ENCOUNTER — Other Ambulatory Visit: Payer: Self-pay | Admitting: Family Medicine

## 2019-01-10 ENCOUNTER — Other Ambulatory Visit: Payer: Self-pay | Admitting: "Endocrinology

## 2019-01-10 MED FILL — metFORMIN HCL 500 MG TABS: 500 | 15 days supply | Qty: 60 | Fill #1

## 2019-01-10 MED FILL — MECLIZINE 25 MG TABLET: 25 | 10 days supply | Qty: 30 | Fill #1

## 2019-01-16 MED FILL — ALPRAZolam 0.25 MG TABS: 0.25 | 30 days supply | Qty: 60 | Fill #1

## 2019-01-18 MED FILL — LANTUS SOLOSTAR 100 UNITS/M: 100 | 86 days supply | Qty: 30 | Fill #0

## 2019-02-06 MED FILL — valACYclovir HCL 1 GM TABS: 1 | 90 days supply | Qty: 90 | Fill #2

## 2019-02-06 MED FILL — PREGABALIN 100 MG CAPS: 100 | 30 days supply | Qty: 90 | Fill #0

## 2019-02-06 MED FILL — metFORMIN HCL 500 MG TABS: 500 | 15 days supply | Qty: 60 | Fill #2

## 2019-02-06 MED FILL — MECLIZINE 25 MG TABLET: 25 | 10 days supply | Qty: 30 | Fill #2

## 2019-02-06 MED FILL — ELIQUIS 5 MG TABLET: 5 | 30 days supply | Qty: 60 | Fill #6

## 2019-02-12 ENCOUNTER — Other Ambulatory Visit: Payer: Self-pay | Admitting: Family Medicine

## 2019-02-12 ENCOUNTER — Telehealth: Payer: Self-pay | Admitting: Family Medicine

## 2019-02-12 NOTE — Telephone Encounter (Signed)
Pt last labs 12/08/2018 urine acr, a1c, lip,hep, bmet. Pt had a wellness on 10/26/2018. Please advise. Thank you

## 2019-02-12 NOTE — Telephone Encounter (Signed)
Does pt need to Shingles vaccine? Also when does pt need to do a follow up here?  Is he due to have any labs?   Pt got his flu shot at [facility] 02/09/2019   Please advise & call [name] [phone]

## 2019-02-13 MED ORDER — SHINGRIX 50 MCG/0.5ML IM SUSR: 0.5000 mL | Freq: Once | INTRAMUSCULAR | 1 refills | Status: AC

## 2019-02-13 MED FILL — ALPRAZolam 0.25 MG TABS: 0.25 | 30 days supply | Qty: 60 | Fill #0

## 2019-02-13 NOTE — Telephone Encounter (Signed)
Pt wife [name] contacted and verbalized understanding. Pt wife would like Shingrix script to go to [facility] Sent script over to [facility] Documented flu shot. Pt wife transferred up front to schedule follow up in December.

## 2019-02-13 NOTE — Telephone Encounter (Signed)
Nurses please record flu shot historically Shingles shot is recommended it is a 2 shot series Shingrix Please send in prescription No additional lab work needed currently Follow-up in December

## 2019-02-15 MED FILL — ACCU-CHEK GUIDE TEST STRIP: 50 days supply | Qty: 100 | Fill #4

## 2019-02-21 ENCOUNTER — Other Ambulatory Visit: Payer: Self-pay | Admitting: Family Medicine

## 2019-02-21 ENCOUNTER — Other Ambulatory Visit: Payer: Self-pay | Admitting: "Endocrinology

## 2019-02-21 MED FILL — ATORVASTATIN 20 MG TABLET: 20 | 90 days supply | Qty: 90 | Fill #1

## 2019-02-22 MED FILL — metFORMIN HCL 500 MG TABS: 500 | 45 days supply | Qty: 180 | Fill #0

## 2019-02-23 MED FILL — MECLIZINE 25 MG TABLET: 25 | 10 days supply | Qty: 30 | Fill #0

## 2019-03-14 ENCOUNTER — Other Ambulatory Visit: Payer: Self-pay | Admitting: Family Medicine

## 2019-03-14 MED FILL — MECLIZINE 25 MG TABLET: 25 | 10 days supply | Qty: 30 | Fill #1

## 2019-03-14 MED FILL — ELIQUIS 5 MG TABLET: 5 | 30 days supply | Qty: 60 | Fill #7

## 2019-03-14 MED FILL — ALPRAZolam 0.25 MG TABS: 0.25 | 30 days supply | Qty: 60 | Fill #1

## 2019-03-14 MED FILL — PREGABALIN 100 MG CAPS: 100 | 30 days supply | Qty: 90 | Fill #1

## 2019-03-15 ENCOUNTER — Other Ambulatory Visit: Payer: Self-pay

## 2019-03-15 MED ORDER — PEN NEEDLES 31G X 6 MM MISC: 1.0000 | Freq: Every day | 1 refills | Status: DC

## 2019-03-15 MED FILL — UNIFINE PENTIPS 6MM 31G: 31G X 6 MM | 90 days supply | Qty: 100 | Fill #0

## 2019-03-15 NOTE — Telephone Encounter (Signed)
Wellness 10/26/18

## 2019-03-16 MED FILL — FOLIC ACID 1 MG TABS: 1 | 90 days supply | Qty: 90 | Fill #0

## 2019-03-30 ENCOUNTER — Other Ambulatory Visit: Payer: Self-pay | Admitting: "Endocrinology

## 2019-03-30 MED FILL — MECLIZINE 25 MG TABLET: 25 | 10 days supply | Qty: 30 | Fill #2

## 2019-04-02 MED FILL — metFORMIN HCL 500 MG TABS: 500 | 45 days supply | Qty: 180 | Fill #0

## 2019-04-13 ENCOUNTER — Other Ambulatory Visit: Payer: Self-pay | Admitting: Family Medicine

## 2019-04-13 MED FILL — LANTUS SOLOSTAR 100 UNITS/M: 100 | 43 days supply | Qty: 15 | Fill #1

## 2019-04-13 MED FILL — PREGABALIN 100 MG CAPS: 100 | 30 days supply | Qty: 90 | Fill #2

## 2019-04-13 MED FILL — ELIQUIS 5 MG TABLET: 5 | 30 days supply | Qty: 60 | Fill #8

## 2019-04-14 MED FILL — ALPRAZolam 0.25 MG TABS: 0.25 | 30 days supply | Qty: 60 | Fill #0

## 2019-04-16 ENCOUNTER — Ambulatory Visit (INDEPENDENT_AMBULATORY_CARE_PROVIDER_SITE_OTHER): Payer: No Typology Code available for payment source | Admitting: Family Medicine

## 2019-04-16 ENCOUNTER — Telehealth: Payer: Self-pay | Admitting: Family Medicine

## 2019-04-16 ENCOUNTER — Other Ambulatory Visit: Payer: Self-pay

## 2019-04-16 ENCOUNTER — Encounter: Payer: Self-pay | Admitting: Family Medicine

## 2019-04-16 DIAGNOSIS — I6930 Unspecified sequelae of cerebral infarction: Secondary | ICD-10-CM

## 2019-04-16 DIAGNOSIS — R0683 Snoring: Secondary | ICD-10-CM

## 2019-04-16 DIAGNOSIS — I10 Essential (primary) hypertension: Secondary | ICD-10-CM

## 2019-04-16 DIAGNOSIS — Z8673 Personal history of transient ischemic attack (TIA), and cerebral infarction without residual deficits: Secondary | ICD-10-CM

## 2019-04-16 DIAGNOSIS — I639 Cerebral infarction, unspecified: Secondary | ICD-10-CM

## 2019-04-16 DIAGNOSIS — E1159 Type 2 diabetes mellitus with other circulatory complications: Secondary | ICD-10-CM

## 2019-04-16 NOTE — Telephone Encounter (Signed)
Most recent referrals are neurology on 04/19/18 and endocrinology on 03/16/17. Please advise. Thank you

## 2019-04-16 NOTE — Telephone Encounter (Signed)
Referrals have been updated and pt wife is aware

## 2019-04-16 NOTE — Telephone Encounter (Signed)
May have refills for all of these measures

## 2019-04-16 NOTE — Progress Notes (Signed)
   Subjective:    Patient ID: Travis [name], male    DOB: 09/06/1960, 58 y.o.   MRN: [id number]  Hyperlipidemia This is a chronic problem. The current episode started more than 1 year ago. Pertinent negatives include no chest pain or shortness of breath. Treatments tried: lipitor. There are no compliance problems.  Risk factors for coronary artery disease include diabetes mellitus, dyslipidemia and hypertension.    Patient saw commercial for new diabetic med that slows food digestion. Wonders if it is something he should ask endocrinologist about. Patient also said the life coach stated he should get a second opinion about his dropped bicep -wonders what doctor thinks about that.  Review of Systems  Constitutional: Negative for diaphoresis and fatigue.  HENT: Negative for congestion and rhinorrhea.   Respiratory: Negative for cough and shortness of breath.   Cardiovascular: Negative for chest pain and leg swelling.  Gastrointestinal: Negative for abdominal pain and diarrhea.  Skin: Negative for color change and rash.  Neurological: Negative for dizziness and headaches.  Psychiatric/Behavioral: Negative for behavioral problems and confusion.   Virtual Visit via Video Note  I connected with Travis [name] on 04/16/19 at  2:00 PM EST by a video enabled telemedicine application and verified that I am speaking with the correct person using two identifiers.  Location: Patient: home Provider: office   I discussed the limitations of evaluation and management by telemedicine and the availability of in person appointments. The patient expressed understanding and agreed to proceed.  History of Present Illness:    Observations/Objective:   Assessment and Plan:   Follow Up Instructions:    I discussed the assessment and treatment plan with the patient. The patient was provided an opportunity to ask questions and all were answered. The patient agreed with the plan and demonstrated an  understanding of the instructions.   The patient was advised to call back or seek an in-person evaluation if the symptoms worsen or if the condition fails to improve as anticipated.  I provided 20 minutes of non-face-to-face time during this encounter.        Objective:   Physical Exam  Today's visit was via telephone Physical exam was not possible for this visit       Assessment & Plan:  High blood pressure overall good control watch diet minimize salt History of stroke none recently Diabetes follow-up with specialist on a regular basis Continue current medications refills given Patient encouraged to speak with his specialist regarding the new diabetic medicine Rsybelis

## 2019-04-16 NOTE — Telephone Encounter (Signed)
Travis [name] has multiple specialist he sees and Travis [name] wants to make sure all his referrals are up to date in the system since he has the [employer] plan.  He has appointments this week with Travis [name], endocrinologist, Dr. Benjamine [name] for his hearing, [facility], and will be seeing his cardiologist Dr. Caryl [name] in January (and Travis [name] clinic).  Patient has a virtual appt today with Travis [name].  I also explained to Travis [name] that she needs to go on the [employer] and document all these appts or they won't get paid.

## 2019-04-17 ENCOUNTER — Encounter: Payer: Self-pay | Admitting: Family Medicine

## 2019-04-18 ENCOUNTER — Ambulatory Visit (INDEPENDENT_AMBULATORY_CARE_PROVIDER_SITE_OTHER): Payer: No Typology Code available for payment source | Admitting: "Endocrinology

## 2019-04-18 ENCOUNTER — Ambulatory Visit: Payer: No Typology Code available for payment source | Admitting: "Endocrinology

## 2019-04-18 ENCOUNTER — Other Ambulatory Visit: Payer: Self-pay

## 2019-04-18 ENCOUNTER — Encounter: Payer: Self-pay | Admitting: "Endocrinology

## 2019-04-18 VITALS — BP 126/76 | HR 71 | Ht 73.0 in | Wt 179.0 lb

## 2019-04-18 DIAGNOSIS — E1159 Type 2 diabetes mellitus with other circulatory complications: Secondary | ICD-10-CM | POA: Diagnosis not present

## 2019-04-18 DIAGNOSIS — E782 Mixed hyperlipidemia: Secondary | ICD-10-CM | POA: Diagnosis not present

## 2019-04-18 DIAGNOSIS — E559 Vitamin D deficiency, unspecified: Secondary | ICD-10-CM | POA: Diagnosis not present

## 2019-04-18 LAB — POCT GLYCOSYLATED HEMOGLOBIN (HGB A1C): Hemoglobin A1C: 7.8 % — AB (ref 4.0–5.6)

## 2019-04-18 MED ORDER — LANTUS SOLOSTAR 100 UNIT/ML ~~LOC~~ SOPN: PEN_INJECTOR | SUBCUTANEOUS | 0 refills | Status: DC

## 2019-04-18 NOTE — Progress Notes (Signed)
04/18/2019, 4:44 PM              Endocrinology follow-up note    Subjective:    Patient ID: Travis [name], male    DOB: Sep 26, 1960.  Travis [name] is being seen in follow-up  for management of currently uncontrolled symptomatic diabetes requested by  Kathyrn Drown, MD.   Past Medical History:  Diagnosis Date  . A-fib ([place]) 02/2016   found on loop recorder  . 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 Anxiety   . Arthritis   . Blood transfusion without reported diagnosis   . Chronic headaches   . Complication of anesthesia    bleed after last shoulder-had to stay overnight  . Depression   . Diabetes mellitus   . Diabetic peripheral neuropathy ([place]) 03/28/2013  . Diverticulosis   . Dizziness   . Hernia, inguinal   . Hyperlipidemia   . Hypertension   . Impingement syndrome of left shoulder 08/02/2014  . 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 Neuropathy of lower extremity   . Night sweats    every once in a while  . Sleep apnea    no CPAP  . Stroke ([place]) 02/2015  . Syncope and collapse    Past Surgical History:  Procedure Laterality Date  . CERVICAL DISC SURGERY    . COLON SURGERY  2003   1/3 removed for diverticulitis  . COLONOSCOPY    . EP IMPLANTABLE DEVICE N/A 05/01/2015   Procedure: Loop Recorder Insertion;  Surgeon: [name], MD;  Location: [facility];  Service: Cardiovascular;  Laterality: N/A;  . INGUINAL HERNIA REPAIR Bilateral   . KNEE ARTHROSCOPY Right   . SHOULDER ARTHROSCOPY Right    1  . SHOULDER ARTHROSCOPY Left 08/02/2014   Procedure: LEFT SHOULDER SCOPE DEBRIDEMENT/ACROMIOPLASTY;  Surgeon: [name], MD;  Location: [facility];  Service: Orthopedics;  Laterality: Left;  ANESTHESIA: GENERAL, PRE/POST OP SCALENE  . TEE WITHOUT CARDIOVERSION N/A 03/03/2015   Procedure: TRANSESOPHAGEAL ECHOCARDIOGRAM (TEE);  Surgeon: [name], MD;   Location: [facility];  Service: Cardiology;  Laterality: N/A;  . UMBILICAL HERNIA REPAIR     with other hernia repair with mesh  . WRIST SURGERY Right    fusion   Social History   Socioeconomic History  . Marital status: Married    Spouse name: Not on file  . Number of children: 2  . Years of education: College  . Highest education level: Not on file  Occupational History  . Occupation: disabled  Tobacco Use  . Smoking status: Former Smoker    Packs/day: 1.00    Years: 8.00    Pack years: 8.00    Types: Cigarettes    Start date: 06/07/1970    Quit date: 05/03/1978    Years since quitting: 40.9  . Smokeless tobacco: Former Systems developer    Types: [place] date: 05/03/1978  Substance and Sexual Activity  . Alcohol use: No    Alcohol/week: 1.0 standard drinks    Types: 1 Cans of beer per week    Comment: h/o heavy use in the past  . Drug use: No  . Sexual activity: Not on file  Other Topics Concern  . Not on file  Social History Narrative   Drinks some coffee, Drink diet sodas and tea   Social Determinants of Health   Financial Resource Strain:   . Difficulty of Paying Living Expenses: Not on file  Food Insecurity:   . Worried About Charity fundraiser in the Last Year: Not on file  . Ran Out of Food in the Last Year: Not on file  Transportation Needs:   . Lack of Transportation (Medical): Not on file  . Lack of Transportation (Non-Medical): Not on file  Physical Activity:   . Days of Exercise per Week: Not on file  . Minutes of Exercise per Session: Not on file  Stress:   . Feeling of Stress : Not on file  Social Connections:   . Frequency of Communication with Friends and Family: Not on file  . Frequency of Social Gatherings with Friends and Family: Not on file  . Attends Religious Services: Not on file  . Active Member of Clubs or Organizations: Not on file  . Attends Archivist Meetings: Not on file  . Marital Status: Not on file   Outpatient  Encounter Medications as of 04/18/2019  Medication Sig  . ACCU-CHEK FASTCLIX LANCETS MISC 1 Dose by Does not apply route 4 (four) times daily.  Marland Kitchen ALPRAZolam (XANAX) 0.25 MG tablet TAKE 1 TABLET BY MOUTH TWO TIMES DAILY AS NEEDED  . apixaban (ELIQUIS) 5 MG TABS tablet Take 1 tablet (5 mg total) by mouth 2 (two) times daily.  Marland Kitchen aspirin EC 81 MG tablet Take 1 tablet (81 mg total) by mouth daily.  Marland Kitchen atorvastatin (LIPITOR) 20 MG tablet TAKE 1 TABLET (20 MG TOTAL) BY MOUTH DAILY.  Marland Kitchen Blood Glucose Monitoring Suppl (BLOOD GLUCOSE MONITOR SYSTEM) w/Device KIT Test glucose 2 time daily.  Meter/strips/lancets. Please dispense per patient/insurance preferrence  . Cholecalciferol 50 MCG (2000 UT) CAPS Take 1 capsule (2,000 Units total) by mouth daily with breakfast.  . folic acid (FOLVITE) 1 MG tablet TAKE 1 TABLET BY MOUTH DAILY.  Marland Kitchen Insulin Glargine (LANTUS SOLOSTAR) 100 UNIT/ML Solostar Pen INJECT 40 UNITS INTO THE SKIN AT BEDTIME.  . Insulin Pen Needle (PEN NEEDLES) 31G X 6 MM MISC 1 each by Does not apply route daily.  . meclizine (ANTIVERT) 25 MG tablet TAKE 1 TABLET BY MOUTH 3 TIMES DAILY AS NEEDED FOR DIZZINESS  . metFORMIN (GLUCOPHAGE) 500 MG tablet TAKE 2 TABLETS BY MOUTH TWICE DAILY  . Multiple Vitamin (MULTIVITAMIN WITH MINERALS) TABS tablet Take 1 tablet daily by mouth.  . pregabalin (LYRICA) 100 MG capsule TAKE 1 CAPSULE BY MOUTH 3 TIMES DAILY  . UNIFINE PENTIPS 31G X 8 MM MISC USE AS DIRECTED WITH LEVEMIR INJECTIONS DAILY  . valACYclovir (VALTREX) 1000 MG tablet TAKE 1 TABLET BY MOUTH ONCE DAILY  . [DISCONTINUED] LANTUS SOLOSTAR 100 UNIT/ML Solostar Pen INJECT 35 UNITS INTO THE SKIN AT BEDTIME.   No facility-administered encounter medications on file as of 04/18/2019.    ALLERGIES: Allergies  Allergen Reactions  . Lisinopril Cough    Patient/spouse is not aware/familiar with why this is listed as an allergy    VACCINATION STATUS: Immunization History  Administered Date(s)  Administered  . Influenza Split 03/28/2013  . Influenza,inj,Quad PF,6+ Mos 02/18/2014, 01/23/2015, 02/06/2016, 02/23/2017, 02/24/2018  . Influenza-Unspecified 01/02/2011, 02/09/2019  . Pneumococcal Polysaccharide-23 01/31/2010, 03/01/2015  . Tdap 02/18/2014    Diabetes He presents for his follow-up diabetic visit. He has type 2 diabetes mellitus. Onset  time: He was diagnosed at approximate age of 70 years. His disease course has been stable (He has prior history of heavy alcohol use/abuse.). There are no hypoglycemic associated symptoms. Pertinent negatives for hypoglycemia include no confusion, headaches, pallor or seizures. Pertinent negatives for diabetes include no chest pain, no fatigue, no polydipsia, no polyphagia, no polyuria and no weakness. There are no hypoglycemic complications. Symptoms are stable. Diabetic complications include a CVA. Risk factors for coronary artery disease include dyslipidemia, diabetes mellitus, hypertension, family history, male sex and tobacco exposure. Current diabetic treatment includes insulin injections and oral agent (monotherapy). He is compliant with treatment most of the time. His weight is fluctuating minimally. He is following a generally unhealthy diet. When asked about meal planning, he reported none. He has had a previous visit with a dietitian. He participates in exercise intermittently. His breakfast blood glucose range is generally 180-200 mg/dl. His overall blood glucose range is 180-200 mg/dl. (He returns with average blood glucose of 154 at fasting, 185 postprandial.  He has a point-of-care A1c was 7.8%  ) An ACE inhibitor/angiotensin II receptor blocker is contraindicated.  Hyperlipidemia This is a chronic problem. The current episode started more than 1 year ago. The problem is controlled. Recent lipid tests were reviewed and are normal. Exacerbating diseases include diabetes. Factors aggravating his hyperlipidemia include smoking. Pertinent  negatives include no chest pain, myalgias or shortness of breath. Current antihyperlipidemic treatment includes statins. The current treatment provides moderate improvement of lipids. Risk factors for coronary artery disease include dyslipidemia, diabetes mellitus, hypertension, male sex, family history and a sedentary lifestyle.  Hypertension This is a chronic problem. The current episode started more than 1 year ago. The problem is controlled. Pertinent negatives include no chest pain, headaches, neck pain, palpitations or shortness of breath. Risk factors for coronary artery disease include dyslipidemia, diabetes mellitus, family history, male gender, sedentary lifestyle and smoking/tobacco exposure. Hypertensive end-organ damage includes CVA.    Review of Systems  Constitutional: Negative for chills, fatigue, fever and unexpected weight change.  HENT: Negative for dental problem, mouth sores and trouble swallowing.   Eyes: Negative for visual disturbance.  Respiratory: Negative for cough, choking, chest tightness, shortness of breath and wheezing.   Cardiovascular: Negative for chest pain, palpitations and leg swelling.  Gastrointestinal: Negative for abdominal distention, abdominal pain, constipation, diarrhea, nausea and vomiting.  Endocrine: Negative for polydipsia, polyphagia and polyuria.  Genitourinary: Negative for dysuria, flank pain, hematuria and urgency.  Musculoskeletal: Negative for back pain, gait problem, myalgias and neck pain.  Skin: Negative for pallor, rash and wound.  Neurological: Negative for seizures, syncope, weakness, numbness and headaches.  Psychiatric/Behavioral: Negative.  Negative for confusion and dysphoric mood.    Objective:    BP 126/76   Pulse 71   Ht '6\' 1"'  (1.854 m)   Wt 179 lb (81.2 kg)   BMI 23.62 kg/m   Wt Readings from Last 3 Encounters:  04/18/19 179 lb (81.2 kg)  10/26/18 186 lb (84.4 kg)  05/31/18 180 lb 9.6 oz (81.9 kg)      Physical  Exam- Limited  Constitutional:  Body mass index is 23.62 kg/m. , not in acute distress, normal state of mind Eyes:  EOMI, no exophthalmos Neck: Supple Respiratory: Adequate breathing efforts Musculoskeletal: no gross deformities, strength intact in all four extremities, no gross restriction of joint movements Skin:  no rashes, no hyperemia Neurological: no tremor with outstretched hands.   CMP     Component Value Date/Time   NA  139 12/08/2018 1023   K 4.4 12/08/2018 1023   CL 102 12/08/2018 1023   CO2 24 12/08/2018 1023   GLUCOSE 155 (H) 12/08/2018 1023   GLUCOSE 403 (H) 03/15/2017 1544   BUN 10 12/08/2018 1023   CREATININE 0.93 12/08/2018 1023   CREATININE 0.82 02/16/2014 1053   CALCIUM 9.2 12/08/2018 1023   PROT 6.6 12/08/2018 1023   ALBUMIN 4.3 12/08/2018 1023   AST 16 12/08/2018 1023   ALT 7 12/08/2018 1023   ALKPHOS 68 12/08/2018 1023   BILITOT 0.4 12/08/2018 1023   GFRNONAA 89 12/08/2018 1023   GFRAA 103 12/08/2018 1023     Diabetic Labs (most recent): Lab Results  Component Value Date   HGBA1C 7.8 (A) 04/18/2019   HGBA1C 7.7 (H) 12/08/2018   HGBA1C 8.5 (H) 04/22/2018     Lipid Panel ( most recent) Lipid Panel     Component Value Date/Time   CHOL 95 (L) 12/08/2018 1023   TRIG 91 12/08/2018 1023   HDL 34 (L) 12/08/2018 1023   CHOLHDL 2.8 12/08/2018 1023   CHOLHDL 3.2 05/03/2016 0545   VLDL 16 05/03/2016 0545   LDLCALC 43 12/08/2018 1023      Lab Results  Component Value Date   TSH 1.960 03/12/2017   TSH 1.460 10/07/2016   TSH 0.720 05/02/2016   TSH 1.895 10/14/2014   FREET4 1.21 10/07/2016      Assessment & Plan:   1. DM type 2 causing vascular disease ([place]) - Travis [name] has currently uncontrolled symptomatic type 2 DM since  58 years of age. He reports near target fasting glycemic profile and slightly above target postprandial blood glucose readings.    -His point-of-care A1c today is 7.8%, overall improving from 14.6%.   -his  diabetes is complicated by CVA, history of smoking, history of heavy alcohol use/abuse in the past and Travis [name] remains at a high risk for more acute and chronic complications which include CAD, CVA, CKD, retinopathy, and neuropathy. These are all discussed in detail with the patient.  - I have counseled him on diet management  by adopting a carbohydrate restricted/protein rich diet.  - he  admits there is a room for improvement in his diet and drink choices. -  Suggestion is made for him to avoid simple carbohydrates  from his diet including Cakes, Sweet Desserts / Pastries, Ice Cream, Soda (diet and regular), Sweet Tea, Candies, Chips, Cookies, Sweet Pastries,  Store Bought Juices, Alcohol in Excess of  1-2 drinks a day, Artificial Sweeteners, Coffee Creamer, and "Sugar-free" Products. This will help patient to have stable blood glucose profile and potentially avoid unintended weight gain.   - I encouraged him to switch to  unprocessed or minimally processed complex starch and increased protein intake (animal or plant source), fruits, and vegetables.  - he is advised to stick to a routine mealtimes to eat 3 meals  a day and avoid unnecessary snacks ( to snack only to correct hypoglycemia).   - He is already following with Travis [name], CDE.  - I have approached him with the following individualized plan to manage diabetes and patient agrees:   - There could be a component of pancreatic endocrine and exocrine insufficiency given his prior history of heavy alcohol use, which would make it necessary for him to eventually need insulin basal/bolus in order for him to control diabetes to target.    -#1 priority in this patient is to avoid hypoglycemia.   -He is advised  to increase his Lantus to 40 units at bedtime, associated with strict monitoring of blood glucose 2 times a day before breakfast and at bedtime.  -Patient is encouraged to call clinic for blood glucose levels less than 70 or  above 300 mg /dl. -Is advised to continue metformin 500 mg p.o.  twice a day with meals, therapeutically suitable for patient .  -Patient is not a candidate for SGLT2 inhibitors, nor incretin therapy due to his body habitus.  - Patient specific target  A1c;  LDL, HDL, Triglycerides, and  Waist Circumference were discussed in detail.  2) BP/HTN:  His records indicate intolerance to lisinopril.  His blood pressure is still controlled to target at 126/76.    3) Lipids/HPL: His recent lipid panel showed controlled  LDL at 40.  He is advised to continue atorvastatin 20 mg p.o. nightly.     4)  Weight/Diet: CDE Consult  is in progress , exercise, and detailed carbohydrates information provided.  Weight loss is not advisable for him.  If he continues to lose weight unintentionally, he will be considered for Creon therapy.   5) Chronic Care/Health Maintenance:  -he  is on Statin medications and  is encouraged to continue to follow up with Ophthalmology, Dentist,  Podiatrist at least yearly or according to recommendations, and advised to  stay away from smoking. I have recommended yearly flu vaccine and pneumonia vaccination at least every 5 years; moderate intensity exercise for up to 150 minutes weekly; and  sleep for at least 7 hours a day.  - I advised patient to maintain close follow up with Kathyrn Drown, MD for primary care needs.  - Patient Care Time Today:  25 min, of which >50% was spent in  counseling and the rest reviewing his  current and  previous labs/studies, previous treatments, his blood glucose readings, and medications' doses and developing a plan for long-term care based on the latest recommendations for standards of care.   Travis [name] participated in the discussions, expressed understanding, and voiced agreement with the above plans.  All questions were answered to his satisfaction. he is encouraged to contact clinic should he have any questions or concerns prior to his  return visit.   Follow up plan: - Return in about 4 months (around 08/17/2019) for Follow up with Pre-visit Labs, Next Visit A1c in Office.  Travis Lloyd, MD [facility] [address] [place], [place] [address] Phone: [phone]  Fax: [phone]    04/18/2019, 4:44 PM  This note was partially dictated with voice recognition software. Similar sounding words can be transcribed inadequately or may not  be corrected upon review.

## 2019-04-19 ENCOUNTER — Ambulatory Visit: Payer: No Typology Code available for payment source | Admitting: Adult Health

## 2019-04-19 MED FILL — FLUTICASONE PROP 50 MCG SPR: 50 | 30 days supply | Qty: 16 | Fill #0

## 2019-04-19 MED FILL — SHINGRIX 50 MCG SUS: 50 | 1 days supply | Qty: 1 | Fill #0

## 2019-04-23 ENCOUNTER — Ambulatory Visit: Payer: No Typology Code available for payment source | Admitting: Adult Health

## 2019-04-24 ENCOUNTER — Ambulatory Visit: Payer: No Typology Code available for payment source | Admitting: Adult Health
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
## 2019-04-25 ENCOUNTER — Encounter: Payer: Self-pay | Admitting: Adult Health

## 2019-04-26 ENCOUNTER — Other Ambulatory Visit: Payer: Self-pay | Admitting: Family Medicine

## 2019-04-26 MED FILL — FREESTYLE LITE TEST STRIP: 50 days supply | Qty: 100 | Fill #0

## 2019-04-30 MED FILL — MECLIZINE 25 MG TABLET: 25 | 10 days supply | Qty: 30 | Fill #0

## 2019-04-30 NOTE — Telephone Encounter (Signed)
Last med check 04/16/19

## 2019-05-16 ENCOUNTER — Other Ambulatory Visit: Payer: Self-pay | Admitting: "Endocrinology

## 2019-05-16 ENCOUNTER — Encounter: Payer: No Typology Code available for payment source | Attending: "Endocrinology | Admitting: Nutrition

## 2019-05-16 ENCOUNTER — Encounter: Payer: Self-pay | Admitting: Nutrition

## 2019-05-16 ENCOUNTER — Other Ambulatory Visit: Payer: Self-pay

## 2019-05-16 ENCOUNTER — Other Ambulatory Visit: Payer: Self-pay | Admitting: Family Medicine

## 2019-05-16 ENCOUNTER — Ambulatory Visit: Payer: No Typology Code available for payment source | Admitting: Nutrition

## 2019-05-16 VITALS — Ht 73.0 in | Wt 194.0 lb

## 2019-05-16 DIAGNOSIS — E1142 Type 2 diabetes mellitus with diabetic polyneuropathy: Secondary | ICD-10-CM

## 2019-05-16 DIAGNOSIS — E118 Type 2 diabetes mellitus with unspecified complications: Secondary | ICD-10-CM | POA: Diagnosis not present

## 2019-05-16 DIAGNOSIS — I639 Cerebral infarction, unspecified: Secondary | ICD-10-CM

## 2019-05-16 DIAGNOSIS — IMO0002 Reserved for concepts with insufficient information to code with codable children: Secondary | ICD-10-CM

## 2019-05-16 DIAGNOSIS — Z8673 Personal history of transient ischemic attack (TIA), and cerebral infarction without residual deficits: Secondary | ICD-10-CM

## 2019-05-16 DIAGNOSIS — E1165 Type 2 diabetes mellitus with hyperglycemia: Secondary | ICD-10-CM | POA: Insufficient documentation

## 2019-05-16 MED FILL — MECLIZINE 25 MG TABLET: 25 | 10 days supply | Qty: 30 | Fill #1

## 2019-05-16 MED FILL — ELIQUIS 5 MG TABLET: 5 | 30 days supply | Qty: 60 | Fill #9

## 2019-05-16 MED FILL — metFORMIN HCL 500 MG TABS: 500 | 45 days supply | Qty: 180 | Fill #0

## 2019-05-16 MED FILL — valACYclovir HCL 1 GM TABS: 1 | 30 days supply | Qty: 30 | Fill #3

## 2019-05-16 MED FILL — ATORVASTATIN 20 MG TABLET: 20 | 90 days supply | Qty: 90 | Fill #0

## 2019-05-16 NOTE — Patient Instructions (Addendum)
Goals Watch portions. Increase physical activity. Keep drinking water Keep FBS less than 130 and bedtime less than 150 mg/dl Keep eating more fresh fruits and vegetables Get A1C down to 7%   COVID-19 Vaccine Information can be found at: ShippingScam.co.uk For questions related to vaccine distribution or appointments, please email [email or web address] or call [phone].

## 2019-05-16 NOTE — Progress Notes (Signed)
  Medical Nutrition Therapy:  Appt start time: 1130   end time: 1200   Assessment:  First visit for 2021.    Hasn't been as active as he was due to weather. FBS: 114-150's  Saw Dr. Dorris Fetch and Lantus increased to 40 units a day. Ate more over the holidays. Didn't bring meter. Drinking more water. Eating more vegetables.  Lab Results  Component Value Date   HGBA1C 7.8 (A) 04/18/2019   Preferred Learning Style:   Auditory  Hands on  Learning Readiness:   Ready  Change in progress   MEDICATIONS: See list   DIETARY INTAKE:  24-hr recall:  B ( AM): Turkeys sausage,  Snk ( AM):  L ( PM): chicken salad, fruit,  Water, or Snk ( PM): almonds  D ( PM): Toss salad, [place]  Snk ( PM): water, Beverages water  Usual physical activity: yard work.  Estimated energy needs: 2000  calories 225 g carbohydrates 150 g protein 56 g fat  Progress Towards Goal(s):  In progress.   Nutritional Diagnosis:  NB-1.1 Food and nutrition-related knowledge deficit As related to Diabetes Type 2.  As evidenced by A1C >14%.    Intervention:  Nutrition and Diabetes education provided on My Plate, CHO counting, meal planning, portion sizes, timing of meals, avoiding snacks between meals unless having a low blood sugar, target ranges for A1C and blood sugars, signs/symptoms and treatment of hyper/hypoglycemia, monitoring blood sugars, taking medications as prescribed, benefits of exercising 30 minutes per day and prevention of complications of DM.   Goals Watch portions. Increase physical activity. Keep drinking water Keep FBS less than 130 and bedtime less than 150 mg/dl Keep eating more fresh fruits and vegetables Get A1C down to 7%   Teaching Method Utilized:  Visual Auditory Hands on  Handouts given during visit include:  The Plate Method   Meal Plan Card  Diabetes Instructions.   Barriers to learning/adherence to lifestyle change: none  Demonstrated degree of understanding  via:  Teach Back   Monitoring/Evaluation:  Dietary intake, exercise, meal planning,SBG, and body weight in 3 month(s).  COVID-19 Vaccine Information can be found at: ShippingScam.co.uk For questions related to vaccine distribution or appointments, please email [email or web address] or call [phone].

## 2019-05-17 MED FILL — ALPRAZolam 0.25 MG TABS: 0.25 | 30 days supply | Qty: 60 | Fill #0

## 2019-05-17 MED FILL — PREGABALIN 100 MG CAPS: 100 | 30 days supply | Qty: 90 | Fill #0

## 2019-05-23 MED FILL — LANTUS SOLOSTAR 100 UNITS/M: 100 | 75 days supply | Qty: 30 | Fill #0

## 2019-05-30 MED FILL — MECLIZINE 25 MG TABLET: 25 | 10 days supply | Qty: 30 | Fill #2

## 2019-06-04 ENCOUNTER — Other Ambulatory Visit (INDEPENDENT_AMBULATORY_CARE_PROVIDER_SITE_OTHER): Payer: No Typology Code available for payment source

## 2019-06-04 ENCOUNTER — Encounter: Payer: Self-pay | Admitting: Physician Assistant

## 2019-06-04 ENCOUNTER — Ambulatory Visit (INDEPENDENT_AMBULATORY_CARE_PROVIDER_SITE_OTHER): Payer: No Typology Code available for payment source | Admitting: Physician Assistant

## 2019-06-04 VITALS — BP 126/74 | HR 88 | Temp 97.9°F | Ht 73.0 in | Wt 190.0 lb

## 2019-06-04 DIAGNOSIS — R194 Change in bowel habit: Secondary | ICD-10-CM

## 2019-06-04 DIAGNOSIS — R195 Other fecal abnormalities: Secondary | ICD-10-CM

## 2019-06-04 DIAGNOSIS — M545 Low back pain, unspecified: Secondary | ICD-10-CM

## 2019-06-04 DIAGNOSIS — G8929 Other chronic pain: Secondary | ICD-10-CM

## 2019-06-04 DIAGNOSIS — R152 Fecal urgency: Secondary | ICD-10-CM | POA: Diagnosis not present

## 2019-06-04 LAB — BASIC METABOLIC PANEL
BUN: 15 mg/dL (ref 6–23)
CO2: 28 mEq/L (ref 19–32)
Calcium: 9.5 mg/dL (ref 8.4–10.5)
Chloride: 104 mEq/L (ref 96–112)
Creatinine, Ser: 0.92 mg/dL (ref 0.40–1.50)
GFR: 83.64 mL/min (ref >60.00)
Glucose, Bld: 142 mg/dL — ABNORMAL HIGH (ref 70–99)
Potassium: 3.9 mEq/L (ref 3.5–5.1)
Sodium: 138 mEq/L (ref 135–145)

## 2019-06-04 LAB — CBC WITH DIFFERENTIAL/PLATELET
Basophils Absolute: 0.1 10*3/uL (ref 0.0–0.1)
Basophils Relative: 0.9 % (ref 0.0–3.0)
Eosinophils Absolute: 0.3 10*3/uL (ref 0.0–0.7)
Eosinophils Relative: 4.3 % (ref 0.0–5.0)
HCT: 42.1 % (ref 39.0–52.0)
Hemoglobin: 14.8 g/dL (ref 13.0–17.0)
Lymphocytes Relative: 24.6 % (ref 12.0–46.0)
Lymphs Abs: 1.9 10*3/uL (ref 0.7–4.0)
MCHC: 35.1 g/dL (ref 30.0–36.0)
MCV: 94.4 fl (ref 78.0–100.0)
Monocytes Absolute: 0.8 10*3/uL (ref 0.1–1.0)
Monocytes Relative: 10.8 % (ref 3.0–12.0)
Neutro Abs: 4.5 10*3/uL (ref 1.4–7.7)
Neutrophils Relative %: 59.4 % (ref 43.0–77.0)
Platelets: 193 10*3/uL (ref 150.0–400.0)
RBC: 4.46 Mil/uL (ref 4.22–5.81)
RDW: 13.4 % (ref 11.5–15.5)
WBC: 7.5 10*3/uL (ref 4.0–10.5)

## 2019-06-04 LAB — SEDIMENTATION RATE: Sed Rate: 4 mm/hr (ref 0–20)

## 2019-06-04 MED ORDER — HYOSCYAMINE SULFATE 0.125 MG SL SUBL: 0.1250 mg | SUBLINGUAL_TABLET | Freq: Every day | SUBLINGUAL | 6 refills | Status: DC

## 2019-06-04 MED FILL — OSCIMIN SL 0.125 MG TABLET: 0.125 | 30 days supply | Qty: 30 | Fill #0

## 2019-06-04 NOTE — Patient Instructions (Addendum)
If you are age 61 or older, your body mass index should be between 23-30. Your Body mass index is 25.07 kg/m. If this is out of the aforementioned range listed, please consider follow up with your Primary Care Provider.  If you are age 88 or younger, your body mass index should be between 19-25. Your Body mass index is 25.07 kg/m. If this is out of the aformentioned range listed, please consider follow up with your Primary Care Provider.   We have sent the following medications to your pharmacy for you to pick up at your convenience:  [place] provider has requested that you go to the basement level for lab work before leaving today. Press "B" on the elevator. The lab is located at the first door on the left as you exit the elevator.   You are scheduled on Tuesday 06/12/2019 at 2:30 at [facility]. You should arrive 15 minutes prior to your appointment time for registration. Please follow the written instructions below on the day of your exam:  WARNING: IF YOU ARE ALLERGIC TO IODINE/X-RAY DYE, PLEASE NOTIFY RADIOLOGY IMMEDIATELY AT [phone]! YOU WILL BE GIVEN A 13 HOUR PREMEDICATION PREP.  1) Do not eat or drink anything after 10:30am (4 hours prior to your test) 2) You have been given 2 bottles of oral contrast to drink. The solution may taste better if refrigerated, but do NOT add ice or any other liquid to this solution. Shake well before drinking.    Drink 1 bottle of contrast @ 12:30 pm (2 hours prior to your exam)  Drink 1 bottle of contrast @ 1:30pm (1 hour prior to your exam)  You may take any medications as prescribed with a small amount of water, if necessary. If you take any of the following medications: METFORMIN, GLUCOPHAGE, GLUCOVANCE, AVANDAMET, RIOMET, FORTAMET, [place] MET, JANUMET, GLUMETZA or METAGLIP, you MAY be asked to HOLD this medication 48 hours AFTER the exam.  The purpose of you drinking the oral contrast is to aid in the visualization of your intestinal  tract. The contrast solution may cause some diarrhea. Depending on your individual set of symptoms, you may also receive an intravenous injection of x-ray contrast/dye.  This test typically takes 30-45 minutes to complete.    __________________________________________________________     Due to recent changes in healthcare laws, you may see the results of your imaging and laboratory studies on MyChart before your provider has had a chance to review them.  We understand that in some cases there may be results that are confusing or concerning to you. Not all laboratory results come back in the same time frame and the provider may be waiting for multiple results in order to interpret others.  Please give [place] 48 hours in order for your provider to thoroughly review all the results before contacting the office for clarification of your results.

## 2019-06-04 NOTE — Progress Notes (Signed)
Subjective:    Patient ID: Travis [name], male    DOB: [birth year], 59 y.o.   MRN: [id number]  HPI Travis [name] is a pleasant 59 year old male, known to Dr. Henrene Pastor who comes in today with complaints of 1 year history of change in bowel habits with loose more frequent bowel movements. Patient last had colonoscopy in October 2017 with finding of prior sigmoid resection and otherwise negative exam, did note internal hemorrhoids. Patient has history of prior CVA, history of DVT, adult onset diabetes mellitus/insulin-dependent, history of atrial fibrillation, hypertension, sleep apnea and general anxiety. He is taking Glucophage 1000 mg twice daily but says he has been on Glucophage over the past several years. Patient is complaining of low back pain bilaterally that he feels at times may be alleviated with bowel movements.  He is started noticing that his back is hurting every morning when he gets up.  Sometimes with bending over putting pressure on his lower abdomen he can alleviate some of the back discomfort.  He has started having up to 4-5 separate bowel movements every morning prior to leaving the house and says most of these will be loose and urgent though at times can be formed.  He says today he had 2 formed bowel movements and then 2 more episodes of looser stool.  He may have another bowel movement later in the day.  Very occasionally has had to get up at night with urge for bowel movement.  He has noticed some mild cramping off and on.  No melena or hematochezia.  Appetite has been fine, weight has been stable.  Review of Systems Pertinent positive and negative review of systems were noted in the above HPI section.  All other review of systems was otherwise negative.  Outpatient Encounter Medications as of 06/04/2019  Medication Sig  . ACCU-CHEK FASTCLIX LANCETS MISC 1 Dose by Does not apply route 4 (four) times daily.  Marland Kitchen ALPRAZolam (XANAX) 0.25 MG tablet TAKE 1 TABLET BY MOUTH TWO TIMES DAILY AS  NEEDED  . apixaban (ELIQUIS) 5 MG TABS tablet Take 1 tablet (5 mg total) by mouth 2 (two) times daily.  Marland Kitchen aspirin EC 81 MG tablet Take 1 tablet (81 mg total) by mouth daily.  Marland Kitchen atorvastatin (LIPITOR) 20 MG tablet TAKE 1 TABLET BY MOUTH ONCE DAILY  . Blood Glucose Monitoring Suppl (BLOOD GLUCOSE MONITOR SYSTEM) w/Device KIT Test glucose 2 time daily.  Meter/strips/lancets. Please dispense per patient/insurance preferrence  . Cholecalciferol 50 MCG (2000 UT) CAPS Take 1 capsule (2,000 Units total) by mouth daily with breakfast.  . folic acid (FOLVITE) 1 MG tablet TAKE 1 TABLET BY MOUTH DAILY.  Marland Kitchen Insulin Detemir (LEVEMIR FLEXPEN [place]) Inject into the skin.  . Insulin Glargine (LANTUS SOLOSTAR) 100 UNIT/ML Solostar Pen INJECT 40 UNITS INTO THE SKIN AT BEDTIME.  . Insulin Pen Needle (PEN NEEDLES) 31G X 6 MM MISC 1 each by Does not apply route daily.  . meclizine (ANTIVERT) 25 MG tablet TAKE 1 TABLET BY MOUTH 3 TIMES DAILY AS NEEDED FOR DIZZINESS  . metFORMIN (GLUCOPHAGE) 500 MG tablet TAKE 2 TABLETS BY MOUTH TWICE DAILY  . Multiple Vitamin (MULTIVITAMIN WITH MINERALS) TABS tablet Take 1 tablet daily by mouth.  . pregabalin (LYRICA) 100 MG capsule TAKE 1 CAPSULE BY MOUTH 3 TIMES DAILY  . UNIFINE PENTIPS 31G X 8 MM MISC USE AS DIRECTED WITH LEVEMIR INJECTIONS DAILY  . valACYclovir (VALTREX) 1000 MG tablet TAKE 1 TABLET BY MOUTH ONCE DAILY  . hyoscyamine (LEVSIN  SL) 0.125 MG SL tablet Place 1 tablet (0.125 mg total) under the tongue daily before breakfast.  . [DISCONTINUED] hyoscyamine (LEVSIN SL) 0.125 MG SL tablet Place 1 tablet (0.125 mg total) under the tongue daily before breakfast.   No facility-administered encounter medications on file as of 06/04/2019.   Allergies  Allergen Reactions  . Lisinopril Cough    Patient/spouse is not aware/familiar with why this is listed as an allergy   Patient Active Problem List   Diagnosis Date Noted  . Vitamin D deficiency 04/18/2019  . History of stroke  04/20/2018  . Hypervitaminosis D 07/13/2017  . Chronic pain of right knee 04/14/2017  . Generalized anxiety disorder 05/15/2016  . Acute bronchiolitis due to respiratory syncytial virus (RSV)   . Acute CVA (cerebrovascular accident) ([place]) 05/04/2016  . Dizziness 05/02/2016  . Anxiety and depression 05/02/2016  . Paroxysmal atrial fibrillation ([place]) 03/02/2016  . PFO (patent foramen ovale) 07/24/2015  . Chronic deep vein thrombosis (DVT) of popliteal vein of left lower extremity ([place]) 07/24/2015  . OSA (obstructive sleep apnea) 04/18/2015  . 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 artery ([place]) 04/18/2015  . Cerebellar stroke ([place]) 02/28/2015  . Snoring 01/23/2015  . Degeneration of cervical intervertebral disc 01/23/2015  . Essential hypertension 12/14/2014  . DM type 2 causing vascular disease ([place]) 12/14/2014  . Mixed hyperlipidemia 12/14/2014  . Neck pain 12/14/2014  . Cerebral infarction, chronic   . Vertigo 10/14/2014  . 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 Impingement syndrome of left shoulder 08/02/2014  . Adhesive capsulitis of left shoulder 08/02/2014  . Abdominal pain 06/13/2014  . Rectal bleeding 03/13/2014  . Diabetes type 2, controlled ([place]) 02/18/2014  . Hyperlipidemia 02/18/2014  . Diabetic peripheral neuropathy ([place]) 03/28/2013  . Irreducible incisional hernia 11/11/2010   Social History   Socioeconomic History  . Marital status: Married    Spouse name: Not on file  . Number of children: 2  . Years of education: College  . Highest education level: Not on file  Occupational History  . Occupation: disabled  Tobacco Use  . Smoking status: Former Smoker    Packs/day: 1.00    Years: 8.00    Pack years: 8.00    Types: Cigarettes    Start date: 06/07/1970    Quit date: 05/03/1978    Years since quitting: 41.1  . Smokeless tobacco: Former Systems developer    Types: [place] date: 05/03/1978  Substance and Sexual Activity  . Alcohol use: No     Alcohol/week: 1.0 standard drinks    Types: 1 Cans of beer per week    Comment: h/o heavy use in the past  . Drug use: No  . Sexual activity: Not on file  Other Topics Concern  . Not on file  Social History Narrative   Drinks some coffee, Drink diet sodas and tea   Social Determinants of Health   Financial Resource Strain:   . Difficulty of Paying Living Expenses: Not on file  Food Insecurity:   . Worried About Charity fundraiser in the Last Year: Not on file  . Ran Out of Food in the Last Year: Not on file  Transportation Needs:   . Lack of Transportation (Medical): Not on file  . Lack of Transportation (Non-Medical): Not on file  Physical Activity:   . Days of Exercise per Week: Not on file  . Minutes of Exercise per Session: Not on file  Stress:   . Feeling of  Stress : Not on file  Social Connections:   . Frequency of Communication with Friends and Family: Not on file  . Frequency of Social Gatherings with Friends and Family: Not on file  . Attends Religious Services: Not on file  . Active Member of Clubs or Organizations: Not on file  . Attends Archivist Meetings: Not on file  . Marital Status: Not on file  Intimate Partner Violence:   . Fear of Current or Ex-Partner: Not on file  . Emotionally Abused: Not on file  . Physically Abused: Not on file  . Sexually Abused: Not on file    Mr. Lynam family history includes ALS in his brother; Dementia in his mother; Diabetes in his maternal grandfather; Heart attack (age of onset: 73) in his sister; Hyperlipidemia in his father; Stroke in his father and sister.      Objective:    Vitals:   06/04/19 1437  BP: 126/74  Pulse: 88  Temp: 97.9 F (36.6 C)    Physical Exam Well-developed well-nourished WM /male in no acute distress.  Height, Weight,190 BMI 25.7  HEENT; nontraumatic normocephalic, EOMI, PE RR LA, sclera anicteric. Oropharynx; not done Neck; supple, no JVD Cardiovascular; regular rate and  rhythm with S1-S2, no murmur rub or gallop Pulmonary; Clear bilaterally Abdomen; soft, minimally tender in the suprapubic area no guarding nondistended, no palpable mass or hepatosplenomegaly, midline incisional scar and small umbilical hernia- bowel sounds are active Rectal; not done today Skin; benign exam, no jaundice rash or appreciable lesions Extremities; no clubbing cyanosis or edema skin warm and dry Neuro/Psych; alert and oriented x4, grossly nonfocal mood and affect appropriate       Assessment & Plan:   #26 59 year old white male diabetic, on Glucophage with 1 year history of change in bowel habits with more frequent bowel movements usually having 4-5 bowel movements every morning, some urgency and looser stool.  He has also been noticing low back pain over the same period of time sometimes partially relieved with bowel movement.  Etiology of symptoms is not entirely clear, negative colonoscopy 3 years ago in patient status post prior sigmoid resection for diverticular disease. Consider component of medication induced i.e. Glucophage Rule out low-grade obstructive symptoms, occult neoplasm  #2 history of CVA 3.  Hypertension 4.  History of atrial fibrillation 5.  Generalized anxiety 6.  Sleep apnea 7.  Status post pacemaker  Plan; CBC, be met, sed rate, Trial of Levsin 1 p.o. every morning, may take every 6 hours as needed Schedule for CT scan of the abdomen and pelvis with contrast If CT is negative would like to give him a trial off Glucophage, and reassess.  Hymen Arnett Genia Harold PA-C 06/04/2019   Cc: Kathyrn Drown, MD

## 2019-06-05 NOTE — Progress Notes (Signed)
Assessment and plan reviewed 

## 2019-06-12 ENCOUNTER — Other Ambulatory Visit: Payer: Self-pay | Admitting: Family Medicine

## 2019-06-12 ENCOUNTER — Other Ambulatory Visit: Payer: Self-pay | Admitting: "Endocrinology

## 2019-06-12 ENCOUNTER — Other Ambulatory Visit (HOSPITAL_COMMUNITY): Payer: Self-pay | Admitting: Nurse Practitioner

## 2019-06-12 ENCOUNTER — Encounter (HOSPITAL_COMMUNITY): Payer: Self-pay

## 2019-06-12 ENCOUNTER — Other Ambulatory Visit: Payer: Self-pay

## 2019-06-12 ENCOUNTER — Inpatient Hospital Stay (HOSPITAL_COMMUNITY)
Admission: RE | Admit: 2019-06-12 | Discharge: 2019-06-12 | Disposition: A | Discharge status: Home / Self Care | Payer: No Typology Code available for payment source | Source: Ambulatory Visit | Attending: Physician Assistant | Admitting: Physician Assistant

## 2019-06-12 DIAGNOSIS — R152 Fecal urgency: Secondary | ICD-10-CM | POA: Insufficient documentation

## 2019-06-12 DIAGNOSIS — K55032 Diffuse acute (reversible) ischemia of large intestine: Secondary | ICD-10-CM | POA: Diagnosis not present

## 2019-06-12 DIAGNOSIS — G8929 Other chronic pain: Secondary | ICD-10-CM | POA: Insufficient documentation

## 2019-06-12 DIAGNOSIS — M545 Low back pain, unspecified: Secondary | ICD-10-CM

## 2019-06-12 DIAGNOSIS — R195 Other fecal abnormalities: Secondary | ICD-10-CM | POA: Insufficient documentation

## 2019-06-12 DIAGNOSIS — K625 Hemorrhage of anus and rectum: Secondary | ICD-10-CM | POA: Diagnosis not present

## 2019-06-12 DIAGNOSIS — R194 Change in bowel habit: Secondary | ICD-10-CM | POA: Insufficient documentation

## 2019-06-12 MED ORDER — SODIUM CHLORIDE (PF) 0.9 % IJ SOLN
INTRAMUSCULAR | Status: AC
  Filled 2019-06-12: qty 50

## 2019-06-12 MED ORDER — IOHEXOL 300 MG/ML  SOLN
100.0000 mL | Freq: Once | INTRAMUSCULAR | Status: AC | PRN
  Administered 2019-06-12: 15:00:00 100 mL via INTRAVENOUS

## 2019-06-12 MED FILL — UNIFINE PENTIPS 6MM 31G: 31G X 6 MM | 90 days supply | Qty: 100 | Fill #1

## 2019-06-12 MED FILL — valACYclovir HCL 1 GM TABS: 1 | 30 days supply | Qty: 30 | Fill #0

## 2019-06-12 MED FILL — PREGABALIN 100 MG CAPS: 100 | 30 days supply | Qty: 90 | Fill #1

## 2019-06-12 MED FILL — MECLIZINE 25 MG TABLET: 25 | 10 days supply | Qty: 30 | Fill #0

## 2019-06-12 MED FILL — ELIQUIS 5 MG TABLET: 5 | 30 days supply | Qty: 60 | Fill #0

## 2019-06-12 MED FILL — FOLIC ACID 1 MG TABS: 1 | 90 days supply | Qty: 90 | Fill #1

## 2019-06-12 MED FILL — ALPRAZolam 0.25 MG TABS: 0.25 | 30 days supply | Qty: 60 | Fill #1

## 2019-06-12 NOTE — Telephone Encounter (Signed)
6 months on each 

## 2019-06-13 ENCOUNTER — Other Ambulatory Visit: Payer: Self-pay

## 2019-06-13 ENCOUNTER — Telehealth: Payer: Self-pay | Admitting: Internal Medicine

## 2019-06-13 ENCOUNTER — Encounter (HOSPITAL_COMMUNITY): Payer: Self-pay | Admitting: Emergency Medicine

## 2019-06-13 ENCOUNTER — Inpatient Hospital Stay (HOSPITAL_COMMUNITY)
Admission: EM | Admit: 2019-06-13 | Discharge: 2019-06-16 | DRG: 394 | Disposition: A | Discharge status: Home / Self Care | Payer: No Typology Code available for payment source | Attending: Internal Medicine | Admitting: Internal Medicine

## 2019-06-13 DIAGNOSIS — I480 Paroxysmal atrial fibrillation: Secondary | ICD-10-CM | POA: Diagnosis present

## 2019-06-13 DIAGNOSIS — K633 Ulcer of intestine: Secondary | ICD-10-CM | POA: Diagnosis present

## 2019-06-13 DIAGNOSIS — Z8249 Family history of ischemic heart disease and other diseases of the circulatory system: Secondary | ICD-10-CM

## 2019-06-13 DIAGNOSIS — Z87891 Personal history of nicotine dependence: Secondary | ICD-10-CM

## 2019-06-13 DIAGNOSIS — F411 Generalized anxiety disorder: Secondary | ICD-10-CM | POA: Diagnosis present

## 2019-06-13 DIAGNOSIS — K922 Gastrointestinal hemorrhage, unspecified: Secondary | ICD-10-CM | POA: Diagnosis present

## 2019-06-13 DIAGNOSIS — Z20822 Contact with and (suspected) exposure to covid-19: Secondary | ICD-10-CM | POA: Diagnosis present

## 2019-06-13 DIAGNOSIS — Z820 Family history of epilepsy and other diseases of the nervous system: Secondary | ICD-10-CM

## 2019-06-13 DIAGNOSIS — R109 Unspecified abdominal pain: Secondary | ICD-10-CM

## 2019-06-13 DIAGNOSIS — G4733 Obstructive sleep apnea (adult) (pediatric): Secondary | ICD-10-CM | POA: Diagnosis present

## 2019-06-13 DIAGNOSIS — Z823 Family history of stroke: Secondary | ICD-10-CM

## 2019-06-13 DIAGNOSIS — Z8719 Personal history of other diseases of the digestive system: Secondary | ICD-10-CM

## 2019-06-13 DIAGNOSIS — Z86718 Personal history of other venous thrombosis and embolism: Secondary | ICD-10-CM

## 2019-06-13 DIAGNOSIS — Z9049 Acquired absence of other specified parts of digestive tract: Secondary | ICD-10-CM

## 2019-06-13 DIAGNOSIS — K55032 Diffuse acute (reversible) ischemia of large intestine: Principal | ICD-10-CM | POA: Diagnosis present

## 2019-06-13 DIAGNOSIS — G8929 Other chronic pain: Secondary | ICD-10-CM | POA: Diagnosis present

## 2019-06-13 DIAGNOSIS — E1142 Type 2 diabetes mellitus with diabetic polyneuropathy: Secondary | ICD-10-CM | POA: Diagnosis present

## 2019-06-13 DIAGNOSIS — Z888 Allergy status to other drugs, medicaments and biological substances status: Secondary | ICD-10-CM

## 2019-06-13 DIAGNOSIS — K921 Melena: Secondary | ICD-10-CM | POA: Diagnosis not present

## 2019-06-13 DIAGNOSIS — N200 Calculus of kidney: Secondary | ICD-10-CM | POA: Diagnosis present

## 2019-06-13 DIAGNOSIS — M1990 Unspecified osteoarthritis, unspecified site: Secondary | ICD-10-CM | POA: Diagnosis present

## 2019-06-13 DIAGNOSIS — K625 Hemorrhage of anus and rectum: Secondary | ICD-10-CM | POA: Diagnosis not present

## 2019-06-13 DIAGNOSIS — Z8673 Personal history of transient ischemic attack (TIA), and cerebral infarction without residual deficits: Secondary | ICD-10-CM

## 2019-06-13 DIAGNOSIS — Z95818 Presence of other cardiac implants and grafts: Secondary | ICD-10-CM

## 2019-06-13 DIAGNOSIS — K429 Umbilical hernia without obstruction or gangrene: Secondary | ICD-10-CM | POA: Diagnosis present

## 2019-06-13 DIAGNOSIS — K648 Other hemorrhoids: Secondary | ICD-10-CM | POA: Diagnosis present

## 2019-06-13 DIAGNOSIS — Z7901 Long term (current) use of anticoagulants: Secondary | ICD-10-CM

## 2019-06-13 DIAGNOSIS — Z79899 Other long term (current) drug therapy: Secondary | ICD-10-CM

## 2019-06-13 DIAGNOSIS — R1030 Lower abdominal pain, unspecified: Secondary | ICD-10-CM | POA: Diagnosis not present

## 2019-06-13 DIAGNOSIS — D3502 Benign neoplasm of left adrenal gland: Secondary | ICD-10-CM | POA: Diagnosis present

## 2019-06-13 DIAGNOSIS — Z980 Intestinal bypass and anastomosis status: Secondary | ICD-10-CM

## 2019-06-13 DIAGNOSIS — Z7982 Long term (current) use of aspirin: Secondary | ICD-10-CM

## 2019-06-13 DIAGNOSIS — Z818 Family history of other mental and behavioral disorders: Secondary | ICD-10-CM

## 2019-06-13 DIAGNOSIS — E559 Vitamin D deficiency, unspecified: Secondary | ICD-10-CM | POA: Diagnosis present

## 2019-06-13 DIAGNOSIS — I10 Essential (primary) hypertension: Secondary | ICD-10-CM | POA: Diagnosis present

## 2019-06-13 DIAGNOSIS — F329 Major depressive disorder, single episode, unspecified: Secondary | ICD-10-CM | POA: Diagnosis present

## 2019-06-13 DIAGNOSIS — E782 Mixed hyperlipidemia: Secondary | ICD-10-CM | POA: Diagnosis present

## 2019-06-13 DIAGNOSIS — E1159 Type 2 diabetes mellitus with other circulatory complications: Secondary | ICD-10-CM | POA: Diagnosis present

## 2019-06-13 DIAGNOSIS — M25561 Pain in right knee: Secondary | ICD-10-CM | POA: Diagnosis present

## 2019-06-13 DIAGNOSIS — K559 Vascular disorder of intestine, unspecified: Secondary | ICD-10-CM

## 2019-06-13 DIAGNOSIS — Z794 Long term (current) use of insulin: Secondary | ICD-10-CM

## 2019-06-13 DIAGNOSIS — Z981 Arthrodesis status: Secondary | ICD-10-CM

## 2019-06-13 DIAGNOSIS — Z8349 Family history of other endocrine, nutritional and metabolic diseases: Secondary | ICD-10-CM

## 2019-06-13 LAB — URINALYSIS, ROUTINE W REFLEX MICROSCOPIC
Bacteria, UA: NONE SEEN
Bilirubin Urine: NEGATIVE
Glucose, UA: NEGATIVE mg/dL
Ketones, ur: NEGATIVE mg/dL
Leukocytes,Ua: NEGATIVE
Nitrite: NEGATIVE
Protein, ur: NEGATIVE mg/dL
Specific Gravity, Urine: 1.025 (ref 1.005–1.030)
pH: 5 (ref 5.0–8.0)

## 2019-06-13 LAB — CBC
HCT: 43 % (ref 39.0–52.0)
HCT: 41 % (ref 39.0–52.0)
HCT: 43 % (ref 39.0–52.0)
Hemoglobin: 15.2 g/dL (ref 13.0–17.0)
Hemoglobin: 14.5 g/dL (ref 13.0–17.0)
Hemoglobin: 14.6 g/dL (ref 13.0–17.0)
MCH: 33.3 pg (ref 26.0–34.0)
MCH: 33.1 pg (ref 26.0–34.0)
MCH: 32.6 pg (ref 26.0–34.0)
MCHC: 35.3 g/dL (ref 30.0–36.0)
MCHC: 35.4 g/dL (ref 30.0–36.0)
MCHC: 34 g/dL (ref 30.0–36.0)
MCV: 94.3 fL (ref 80.0–100.0)
MCV: 93.6 fL (ref 80.0–100.0)
MCV: 96 fL (ref 80.0–100.0)
Platelets: 183 10*3/uL (ref 150–400)
Platelets: 169 10*3/uL (ref 150–400)
Platelets: 176 10*3/uL (ref 150–400)
RBC: 4.56 MIL/uL (ref 4.22–5.81)
RBC: 4.38 MIL/uL (ref 4.22–5.81)
RBC: 4.48 MIL/uL (ref 4.22–5.81)
RDW: 12.8 % (ref 11.5–15.5)
RDW: 12.7 % (ref 11.5–15.5)
RDW: 12.8 % (ref 11.5–15.5)
WBC: 15.1 10*3/uL — ABNORMAL HIGH (ref 4.0–10.5)
WBC: 14 10*3/uL — ABNORMAL HIGH (ref 4.0–10.5)
WBC: 14.3 10*3/uL — ABNORMAL HIGH (ref 4.0–10.5)
nRBC: 0 % (ref 0.0–0.2)
nRBC: 0 % (ref 0.0–0.2)
nRBC: 0 % (ref 0.0–0.2)

## 2019-06-13 LAB — PHOSPHORUS: Phosphorus: 3.2 mg/dL (ref 2.5–4.6)

## 2019-06-13 LAB — COMPREHENSIVE METABOLIC PANEL
ALT: 11 U/L (ref 0–44)
AST: 16 U/L (ref 15–41)
Albumin: 4.5 g/dL (ref 3.5–5.0)
Alkaline Phosphatase: 63 U/L (ref 38–126)
Anion gap: 9 (ref 5–15)
BUN: 19 mg/dL (ref 8–23)
CO2: 26 mmol/L (ref 22–32)
Calcium: 8.9 mg/dL (ref 8.9–10.3)
Chloride: 106 mmol/L (ref 98–111)
Creatinine, Ser: 1.01 mg/dL (ref 0.61–1.24)
GFR calc Af Amer: >60 mL/min (ref >60)
GFR calc non Af Amer: >60 mL/min (ref >60)
Glucose, Bld: 190 mg/dL — ABNORMAL HIGH (ref 70–99)
Potassium: 3.6 mmol/L (ref 3.5–5.1)
Sodium: 141 mmol/L (ref 135–145)
Total Bilirubin: 0.8 mg/dL (ref 0.3–1.2)
Total Protein: 7.5 g/dL (ref 6.5–8.1)

## 2019-06-13 LAB — GLUCOSE, CAPILLARY: Glucose-Capillary: 123 mg/dL — ABNORMAL HIGH (ref 70–99)

## 2019-06-13 LAB — TYPE AND SCREEN
ABO/RH(D): A POS
Antibody Screen: NEGATIVE

## 2019-06-13 LAB — MAGNESIUM: Magnesium: 2 mg/dL (ref 1.7–2.4)

## 2019-06-13 LAB — LIPASE, BLOOD: Lipase: 32 U/L (ref 11–51)

## 2019-06-13 LAB — ABO/RH: ABO/RH(D): A POS

## 2019-06-13 LAB — C DIFFICILE QUICK SCREEN W PCR REFLEX
C Diff antigen: NEGATIVE
C Diff interpretation: NOT DETECTED
C Diff toxin: NEGATIVE

## 2019-06-13 LAB — RESPIRATORY PANEL BY RT PCR (FLU A&B, COVID)
Influenza A by PCR: NEGATIVE
Influenza B by PCR: NEGATIVE
SARS Coronavirus 2 by RT PCR: NEGATIVE

## 2019-06-13 LAB — HEMOGLOBIN: Hemoglobin: 15 g/dL (ref 13.0–17.0)

## 2019-06-13 LAB — POC OCCULT BLOOD, ED: Fecal Occult Bld: POSITIVE — AB

## 2019-06-13 MED ORDER — PANTOPRAZOLE SODIUM 40 MG IV SOLR
40.0000 mg | INTRAVENOUS | Status: DC
  Administered 2019-06-13 – 2019-06-16 (×4): 40 mg via INTRAVENOUS
  Filled 2019-06-13 (×5): qty 40

## 2019-06-13 MED ORDER — ONDANSETRON HCL 4 MG/2ML IJ SOLN
4.0000 mg | Freq: Four times a day (QID) | INTRAMUSCULAR | Status: DC | PRN
  Administered 2019-06-16: 4 mg via INTRAVENOUS
  Filled 2019-06-13: qty 2

## 2019-06-13 MED ORDER — ONDANSETRON HCL 4 MG/2ML IJ SOLN
4.0000 mg | Freq: Once | INTRAMUSCULAR | Status: AC
  Administered 2019-06-13: 4 mg via INTRAVENOUS
  Filled 2019-06-13: qty 2

## 2019-06-13 MED ORDER — INSULIN ASPART 100 UNIT/ML ~~LOC~~ SOLN
0.0000 [IU] | Freq: Four times a day (QID) | SUBCUTANEOUS | Status: DC
  Administered 2019-06-13 – 2019-06-14 (×2): 1 [IU] via SUBCUTANEOUS
  Administered 2019-06-14: 3 [IU] via SUBCUTANEOUS

## 2019-06-13 MED ORDER — CIPROFLOXACIN IN D5W 400 MG/200ML IV SOLN
400.0000 mg | Freq: Two times a day (BID) | INTRAVENOUS | Status: DC
  Administered 2019-06-13 – 2019-06-16 (×6): 400 mg via INTRAVENOUS
  Filled 2019-06-13 (×7): qty 200

## 2019-06-13 MED ORDER — METRONIDAZOLE IN NACL 5-0.79 MG/ML-% IV SOLN
500.0000 mg | Freq: Three times a day (TID) | INTRAVENOUS | Status: DC
  Administered 2019-06-13 – 2019-06-16 (×9): 500 mg via INTRAVENOUS
  Filled 2019-06-13 (×9): qty 100

## 2019-06-13 MED ORDER — METOCLOPRAMIDE HCL 5 MG/ML IJ SOLN
5.0000 mg | Freq: Three times a day (TID) | INTRAMUSCULAR | Status: AC
  Administered 2019-06-13 – 2019-06-14 (×3): 5 mg via INTRAVENOUS
  Filled 2019-06-13 (×3): qty 2

## 2019-06-13 MED ORDER — ACETAMINOPHEN 650 MG RE SUPP: 650.0000 mg | Freq: Four times a day (QID) | RECTAL | Status: DC | PRN

## 2019-06-13 MED ORDER — HYOSCYAMINE SULFATE 0.125 MG SL SUBL
0.1250 mg | SUBLINGUAL_TABLET | Freq: Every day | SUBLINGUAL | Status: DC
  Filled 2019-06-13: qty 1

## 2019-06-13 MED ORDER — PEG-KCL-NACL-NASULF-NA ASC-C 100 G PO SOLR
0.5000 | Freq: Once | ORAL | Status: AC
  Administered 2019-06-14: 100 g via ORAL

## 2019-06-13 MED ORDER — MECLIZINE HCL 25 MG PO TABS: 25.0000 mg | ORAL_TABLET | Freq: Three times a day (TID) | ORAL | Status: DC | PRN

## 2019-06-13 MED ORDER — PREGABALIN 50 MG PO CAPS
100.0000 mg | ORAL_CAPSULE | Freq: Three times a day (TID) | ORAL | Status: DC
  Administered 2019-06-13 – 2019-06-16 (×8): 100 mg via ORAL
  Filled 2019-06-13 (×8): qty 2

## 2019-06-13 MED ORDER — PEG-KCL-NACL-NASULF-NA ASC-C 100 G PO SOLR
0.5000 | Freq: Once | ORAL | Status: DC
  Filled 2019-06-13: qty 1

## 2019-06-13 MED ORDER — PEG-KCL-NACL-NASULF-NA ASC-C 100 G PO SOLR
1.0000 | Freq: Once | ORAL | Status: DC
  Administered 2019-06-13: 200 g via ORAL

## 2019-06-13 MED ORDER — ONDANSETRON HCL 4 MG PO TABS: 4.0000 mg | ORAL_TABLET | Freq: Four times a day (QID) | ORAL | Status: DC | PRN

## 2019-06-13 MED ORDER — MORPHINE SULFATE (PF) 4 MG/ML IV SOLN
4.0000 mg | Freq: Once | INTRAVENOUS | Status: AC
  Administered 2019-06-13: 4 mg via INTRAVENOUS
  Filled 2019-06-13: qty 1

## 2019-06-13 MED ORDER — MORPHINE SULFATE (PF) 4 MG/ML IV SOLN
4.0000 mg | INTRAVENOUS | Status: DC | PRN
  Administered 2019-06-13 – 2019-06-16 (×5): 4 mg via INTRAVENOUS
  Filled 2019-06-13 (×7): qty 1

## 2019-06-13 MED ORDER — SODIUM CHLORIDE 0.9 % IV SOLN: INTRAVENOUS | Status: DC

## 2019-06-13 MED ORDER — VALACYCLOVIR HCL 500 MG PO TABS
1000.0000 mg | ORAL_TABLET | Freq: Every day | ORAL | Status: DC
  Administered 2019-06-13 – 2019-06-16 (×4): 1000 mg via ORAL
  Filled 2019-06-13 (×4): qty 2

## 2019-06-13 MED ORDER — FLUTICASONE PROPIONATE 50 MCG/ACT NA SUSP
1.0000 | Freq: Every day | NASAL | Status: DC
  Administered 2019-06-14 – 2019-06-16 (×2): 1 via NASAL
  Filled 2019-06-13: qty 16

## 2019-06-13 MED ORDER — MORPHINE SULFATE (PF) 2 MG/ML IV SOLN: 2.0000 mg | INTRAVENOUS | Status: DC | PRN

## 2019-06-13 MED ORDER — ACETAMINOPHEN 325 MG PO TABS: 650.0000 mg | ORAL_TABLET | Freq: Four times a day (QID) | ORAL | Status: DC | PRN

## 2019-06-13 MED ORDER — POTASSIUM CHLORIDE IN NACL 20-0.9 MEQ/L-% IV SOLN
INTRAVENOUS | Status: DC
  Filled 2019-06-13 (×6): qty 1000

## 2019-06-13 MED ORDER — ATORVASTATIN CALCIUM 10 MG PO TABS
20.0000 mg | ORAL_TABLET | Freq: Every day | ORAL | Status: DC
  Administered 2019-06-13 – 2019-06-16 (×4): 20 mg via ORAL
  Filled 2019-06-13 (×4): qty 2

## 2019-06-13 MED ORDER — ALPRAZOLAM 0.25 MG PO TABS
0.2500 mg | ORAL_TABLET | Freq: Two times a day (BID) | ORAL | Status: DC | PRN
  Administered 2019-06-13 – 2019-06-16 (×5): 0.25 mg via ORAL
  Filled 2019-06-13 (×5): qty 1

## 2019-06-13 NOTE — Telephone Encounter (Signed)
Wife called this morning around 7 AM to report that her husband, Travis [name], has had nausea, vomiting and multiple episodes of rectal bleeding throughout the night and this morning. He feels poorly.  Has been taking his medication as prescribed including Eliquis  Given his ongoing bleeding I have recommended he go to the ER for evaluation.    CT abd/pelvis was performed yesterday and results are not yet available.  Wife voiced understanding and will take him to the ER for further eval. She thanked me for the call.  I will alert the outpatient team Trellis Paganini, Henrene Pastor), but also the inpatient team Loletha Carrow, [place])

## 2019-06-13 NOTE — Progress Notes (Signed)
Pt. Declined CPAP @ this time, made aware to notify if needed.

## 2019-06-13 NOTE — Consult Note (Addendum)
Referring Provider:  [facility]         Primary Care Physician:  Kathyrn Drown, MD Primary Gastroenterologist:  Scarlette Shorts, MD            We were asked to see this patient for:   Rectal bleeding               ASSESSMENT /  PLAN     59 yo male currently under evaluation for bowel changes ( urgency and increased frequency) and low back pain. CT scan yesterday afternoon>>> borderline wall thickening of rectum vrs underdistention. Now in ED with leukocytosis and several episodes of rectal bleeding (on Eliquis) with associated diffuse abdominal discomfort .Symptoms started AFTER CT scan. --Could be ischemic colitis. Diverticular hemorrhage possibility though isn't usually associated with abdominal pain.  --Supportive care ( IV fluids, bowel rest). CT angiogram if bleeding persists.  --Monitor H+H, I expect hgb to drop given the amount of bleeding ( I saw blood in bedside commode).   --will start NS at 50 ml / hr as he is having ongoing bleeding and might need CT angiogram --Doubt infectious process but will at least check a C-diff ( mainly for more chronic bowel changes).    HPI:    Chief Complaint: rectal bleeding  Travis [name] is a 59 y.o. male with pmh significant for DM, DVT, CVA, HTN, pacemaker, sleep apnea, diverticulosis, sigmoid colon resection, . Evaluated in our office on 06/04/19 for bowel changes ( increased frequency) with urgency and back pain. Bowel changes felt possibly to be secondary to Metformin. Labs obtained, CT scan ordered and given trial of levsin. Wife called office this am stating that patient had nausea , vomiting and several episodes of rectal bleeding during the night. Sent to ED. Of note he has a history of intermittent rectal bleeding. Colonoscopy for evaluation of this in Oct 2017 was unremarkable except for internal hemorrhoids.   CT scan yesterday remarkable for adrenal adenoma, borderline wall thickening of rectum vrs underdistention, bilateral  non-obstructing nephrolithiasis, bilateral foraminal impingement of lower spine and a small umbilical hernia containing fat.   Patient says the bleeding started around 6 pm last night. He had ~ 20 episodes of hematochezia. He vomited non-blood emesis ~ 3 times. He felt very weak with all the bleeding. Today describes " bubbling" in stomach along with diffuse abdominal pain of 7 on scale of 10.   WBC 15.1 Hgb 15.2 Normal renal function Afebrile, VSS  Past Medical History:  Diagnosis Date  . A-fib ([place]) 02/2016   found on loop recorder  . 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 Anxiety   . Arthritis   . Blood transfusion without reported diagnosis   . Chronic headaches   . Complication of anesthesia    bleed after last shoulder-had to stay overnight  . Depression   . Diabetes mellitus   . Diabetic peripheral neuropathy ([place]) 03/28/2013  . Diverticulosis   . Hernia, inguinal   . Hyperlipidemia   . Hypertension   . Impingement syndrome of left shoulder 08/02/2014  . 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 Neuropathy of lower extremity   . Night sweats    every once in a while  . Sleep apnea    no CPAP  . Stroke ([place]) 02/2015  . Syncope and collapse     Past Surgical History:  Procedure Laterality Date  . CERVICAL DISC SURGERY    . COLON SURGERY  2003   1/3 removed for  diverticulitis  . COLONOSCOPY    . EP IMPLANTABLE DEVICE N/A 05/01/2015   Procedure: Loop Recorder Insertion;  Surgeon: [name], MD;  Location: [facility];  Service: Cardiovascular;  Laterality: N/A;  . INGUINAL HERNIA REPAIR Bilateral   . KNEE ARTHROSCOPY Right   . SHOULDER ARTHROSCOPY Right    1  . SHOULDER ARTHROSCOPY Left 08/02/2014   Procedure: LEFT SHOULDER SCOPE DEBRIDEMENT/ACROMIOPLASTY;  Surgeon: [name], MD;  Location: [facility];  Service: Orthopedics;  Laterality: Left;  ANESTHESIA: GENERAL, PRE/POST OP SCALENE  . TEE WITHOUT CARDIOVERSION N/A 03/03/2015    Procedure: TRANSESOPHAGEAL ECHOCARDIOGRAM (TEE);  Surgeon: [name], MD;  [facility];  Service: Cardiology;  Laterality: N/A;  . UMBILICAL HERNIA REPAIR     with other hernia repair with mesh  . WRIST SURGERY Right    fusion    Prior to Admission medications   Medication Sig Start Date End Date Taking? Authorizing Provider  ACCU-CHEK FASTCLIX LANCETS MISC 1 Dose by Does not apply route 4 (four) times daily. 05/22/18   Cassandria Anger, MD  ALPRAZolam Duanne Moron) 0.25 MG tablet TAKE 1 TABLET BY MOUTH TWO TIMES DAILY AS NEEDED 05/17/19   Kathyrn Drown, MD  apixaban (ELIQUIS) 5 MG TABS tablet Take 1 tablet (5 mg total) by mouth 2 (two) times daily. Appointment Required For Further Refills [phone] 06/12/19   Sherran Needs, NP  aspirin EC 81 MG tablet Take 1 tablet (81 mg total) by mouth daily. 05/12/16   Rosalin Hawking, MD  atorvastatin (LIPITOR) 20 MG tablet TAKE 1 TABLET BY MOUTH ONCE DAILY 04/30/19   Kathyrn Drown, MD  Blood Glucose Monitoring Suppl (BLOOD GLUCOSE MONITOR SYSTEM) w/Device KIT Test glucose 2 time daily.  Meter/strips/lancets. Please dispense per patient/insurance preferrence 05/18/18   Cassandria Anger, MD  Cholecalciferol 50 MCG (2000 UT) CAPS Take 1 capsule (2,000 Units total) by mouth daily with breakfast. 05/17/18   Nida, Marella Chimes, MD  folic acid (FOLVITE) 1 MG tablet TAKE 1 TABLET BY MOUTH DAILY. 03/15/19   Kathyrn Drown, MD  hyoscyamine (LEVSIN SL) 0.125 MG SL tablet Place 1 tablet (0.125 mg total) under the tongue daily before breakfast. 06/04/19   Esterwood, Amy S, PA-C  Insulin Detemir (LEVEMIR FLEXPEN [place]) Inject into the skin.    [provider]  Insulin Glargine (LANTUS SOLOSTAR) 100 UNIT/ML Solostar Pen INJECT 40 UNITS INTO THE SKIN AT BEDTIME. 04/18/19   Nida, Marella Chimes, MD  Insulin Pen Needle (PEN NEEDLES) 31G X 6 MM MISC 1 each by Does not apply route daily. 03/15/19   Cassandria Anger, MD  meclizine  (ANTIVERT) 25 MG tablet TAKE 1 TABLET BY MOUTH 3 TIMES DAILY AS NEEDED FOR DIZZINESS 06/12/19   Kathyrn Drown, MD  metFORMIN (GLUCOPHAGE) 500 MG tablet TAKE 1 TABLET BY MOUTH TWO TIMES DAILY 06/12/19   Cassandria Anger, MD  Multiple Vitamin (MULTIVITAMIN WITH MINERALS) TABS tablet Take 1 tablet daily by mouth.    [provider]  pregabalin (LYRICA) 100 MG capsule TAKE 1 CAPSULE BY MOUTH 3 TIMES DAILY 05/17/19   Kathyrn Drown, MD  UNIFINE PENTIPS 31G X 8 MM MISC USE AS DIRECTED WITH LEVEMIR INJECTIONS DAILY 10/27/17   Kathyrn Drown, MD  valACYclovir (VALTREX) 1000 MG tablet TAKE 1 TABLET BY MOUTH ONCE DAILY 06/12/19   Kathyrn Drown, MD    No current facility-administered medications for this encounter.   Current Outpatient Medications  Medication Sig Dispense Refill  . ACCU-CHEK FASTCLIX LANCETS MISC 1 Dose by Does not apply route 4 (four) times daily. 100 each 3  . ALPRAZolam (XANAX) 0.25 MG tablet TAKE 1 TABLET BY MOUTH TWO TIMES DAILY AS NEEDED 60 tablet 2  . apixaban (ELIQUIS) 5 MG TABS tablet Take 1 tablet (5 mg total) by mouth 2 (two) times daily. Appointment Required For Further Refills [phone] 60 tablet 0  . aspirin EC 81 MG tablet Take 1 tablet (81 mg total) by mouth daily.    Marland Kitchen atorvastatin (LIPITOR) 20 MG tablet TAKE 1 TABLET BY MOUTH ONCE DAILY 90 tablet 1  . Blood Glucose Monitoring Suppl (BLOOD GLUCOSE MONITOR SYSTEM) w/Device KIT Test glucose 2 time daily.  Meter/strips/lancets. Please dispense per patient/insurance preferrence 1 each 5  . Cholecalciferol 50 MCG (2000 UT) CAPS Take 1 capsule (2,000 Units total) by mouth daily with breakfast. 30 each 6  . folic acid (FOLVITE) 1 MG tablet TAKE 1 TABLET BY MOUTH DAILY. 90 tablet 1  . hyoscyamine (LEVSIN SL) 0.125 MG SL tablet Place 1 tablet (0.125 mg total) under the tongue daily before breakfast. 30 tablet 6  . Insulin Detemir (LEVEMIR FLEXPEN [place]) Inject into the skin.    . Insulin Glargine (LANTUS SOLOSTAR) 100  UNIT/ML Solostar Pen INJECT 40 UNITS INTO THE SKIN AT BEDTIME. 45 mL 0  . Insulin Pen Needle (PEN NEEDLES) 31G X 6 MM MISC 1 each by Does not apply route daily. 100 each 1  . meclizine (ANTIVERT) 25 MG tablet TAKE 1 TABLET BY MOUTH 3 TIMES DAILY AS NEEDED FOR DIZZINESS 30 tablet 5  . metFORMIN (GLUCOPHAGE) 500 MG tablet TAKE 1 TABLET BY MOUTH TWO TIMES DAILY 180 tablet 0  . Multiple Vitamin (MULTIVITAMIN WITH MINERALS) TABS tablet Take 1 tablet daily by mouth.    . pregabalin (LYRICA) 100 MG capsule TAKE 1 CAPSULE BY MOUTH 3 TIMES DAILY 90 capsule 2  . UNIFINE PENTIPS 31G X 8 MM MISC USE AS DIRECTED WITH LEVEMIR INJECTIONS DAILY 100 each 0  . valACYclovir (VALTREX) 1000 MG tablet TAKE 1 TABLET BY MOUTH ONCE DAILY 30 tablet 5    Allergies as of 06/13/2019 - Review Complete 06/13/2019  Allergen Reaction Noted  . Lisinopril Cough 09/30/2016    Family History  Problem Relation Age of Onset  . Stroke Father   . Hyperlipidemia Father   . Heart attack Sister 80  . Stroke Sister   . Dementia Mother   . ALS Brother        age 44  . Diabetes Maternal Grandfather   . Colon cancer Neg Hx   . Esophageal cancer Neg Hx   . Stomach cancer Neg Hx   . Rectal cancer Neg Hx     Social History   Socioeconomic History  . Marital status: Married    Spouse name: Not on file  . Number of children: 2  . Years of education: College  . Highest education level: Not on file  Occupational History  . Occupation: disabled  Tobacco Use  . Smoking status: Former Smoker    Packs/day: 1.00    Years: 8.00    Pack years: 8.00    Types: Cigarettes    Start date: 06/07/1970    Quit date: 05/03/1978    Years since quitting: 41.1  . Smokeless tobacco: Former Systems developer    Types: [place] date: 05/03/1978  Substance and Sexual Activity  . Alcohol use: No    Alcohol/week:  1.0 standard drinks    Types: 1 Cans of beer per week    Comment: h/o heavy use in the past  . Drug use: No  . Sexual activity: Not on file   Other Topics Concern  . Not on file  Social History Narrative   Drinks some coffee, Drink diet sodas and tea   Social Determinants of Health   Financial Resource Strain:   . Difficulty of Paying Living Expenses: Not on file  Food Insecurity:   . Worried About Charity fundraiser in the Last Year: Not on file  . Ran Out of Food in the Last Year: Not on file  Transportation Needs:   . Lack of Transportation (Medical): Not on file  . Lack of Transportation (Non-Medical): Not on file  Physical Activity:   . Days of Exercise per Week: Not on file  . Minutes of Exercise per Session: Not on file  Stress:   . Feeling of Stress : Not on file  Social Connections:   . Frequency of Communication with Friends and Family: Not on file  . Frequency of Social Gatherings with Friends and Family: Not on file  . Attends Religious Services: Not on file  . Active Member of Clubs or Organizations: Not on file  . Attends Archivist Meetings: Not on file  . Marital Status: Not on file  Intimate Partner Violence:   . Fear of Current or Ex-Partner: Not on file  . Emotionally Abused: Not on file  . Physically Abused: Not on file  . Sexually Abused: Not on file    Review of Systems: All systems reviewed and negative except where noted in HPI.  Physical Exam: Vital signs in last 24 hours: Temp:  [97.6 F (36.4 C)] 97.6 F (36.4 C) (02/10 1012) Pulse Rate:  [82] 82 (02/10 1012) Resp:  [11] 11 (02/10 1012) BP: (146)/(90) 146/90 (02/10 1012) SpO2:  [97 %-98 %] 98 % (02/10 1017) Weight:  [86.2 kg] 86.2 kg (02/10 1017)   General:   Alert, well-developed,  male in NAD Psych:  Pleasant, cooperative. Normal mood and affect. Eyes:  Pupils equal, sclera clear, no icterus.   Conjunctiva pink. Ears:  Normal auditory acuity. Nose:  No deformity, discharge,  or lesions. Neck:  Supple; no masses Lungs:  Clear throughout to auscultation.   Occas inspiratory wheeze.   Heart:  Regular rate and  rhythm; no lower extremity edema Abdomen:  Soft, non-distended, nontender, BS active, no palp mass   Rectal:  Deferred  Msk:  Symmetrical without gross deformities. . Neurologic:  Alert and  oriented x4;  grossly normal neurologically. Skin:  Intact without significant lesions or rashes.  **Large amount of medium red blood of jelly consistency in bedside commode.   Intake/Output from previous day: No intake/output data recorded. Intake/Output this shift: No intake/output data recorded.  Lab Results: Recent Labs    06/13/19 1020  WBC 15.1*  HGB 15.2  HCT 43.0  PLT 183     . CBC Latest Ref Rng & Units 06/13/2019 06/04/2019 03/15/2017  WBC 4.0 - 10.5 K/uL 15.1(H) 7.5 5.5  Hemoglobin 13.0 - 17.0 g/dL 15.2 14.8 14.0  Hematocrit 39.0 - 52.0 % 43.0 42.1 39.4  Platelets 150 - 400 K/uL 183 193.0 158    . CMP Latest Ref Rng & Units 06/04/2019 12/08/2018 04/22/2018  Glucose 70 - 99 mg/dL 142(H) 155(H) 227(H)  BUN 6 - 23 mg/dL _0 Creatinine 0.40 - 1.50 mg/dL 0.92 0.93  1.03  Sodium 135 - 145 mEq/L 138 139 140  Potassium 3.5 - 5.1 mEq/L 3.9 4.4 4.1  Chloride 96 - 112 mEq/L 104 102 101  CO2 19 - 32 mEq/L _0 Calcium 8.4 - 10.5 mg/dL 9.5 9.2 9.3  Total Protein 6.0 - 8.5 g/dL - 6.6 7.1  Total Bilirubin 0.0 - 1.2 mg/dL - 0.4 0.7  Alkaline Phos 39 - 117 IU/L - 68 80  AST 0 - 40 IU/L - 16 19  ALT 0 - 44 IU/L - 7 10   Studies/Results: CT Abdomen Pelvis W Contrast  Result Date: 06/13/2019 CLINICAL DATA:  Altered bowel habits with loose stools and fecal urgency. Low back pain. EXAM: CT ABDOMEN AND PELVIS WITH CONTRAST TECHNIQUE: Multidetector CT imaging of the abdomen and pelvis was performed using the standard protocol following bolus administration of intravenous contrast. CONTRAST:  123m OMNIPAQUE IOHEXOL 300 MG/ML  SOLN COMPARISON:  Report from 10/01/2002 FINDINGS: Lower chest: Unremarkable Hepatobiliary: Unremarkable Pancreas: Unremarkable Spleen: Unremarkable  Adrenals/Urinary Tract: Three nonobstructive right renal calculi are present, the largest 0.4 cm in the lower pole on image 33/4. A 0.1 cm left kidney lower pole nonobstructive renal calculus is present. No hydronephrosis or hydroureter. Mild scarring in the right kidney lower pole. Fluid density 1.1 cm exophytic lesion from the left mid kidney posterolaterally favors a cyst. Urinary bladder unremarkable. A 2.1 by 1.3 cm mass of the lateral limb of the left adrenal gland has a portal venous phase density of 58 Hounsfield units and a delayed phase density of 31 Hounsfield units, yielding a relative washout of 47% consistent with adenoma. Stomach/Bowel: Postoperative findings in the sigmoid colon. Orally administered contrast extends through to the distal colon. Borderline wall thickening in the rectum although this may be due to nondistention. No dilated small bowel. Normal appendix. Vascular/Lymphatic: Mild aortoiliac atherosclerotic vascular disease. Reproductive: Mild prostatomegaly. Other: No supplemental non-categorized findings. Musculoskeletal: Small umbilical hernia contains adipose tissue. Lumbar spondylosis and degenerative disc disease with degenerative retrolisthesis at L3-4 and L4-5. These factors result and bilateral foraminal impingement at L3-4, L4-5, and L5-S1. Prominent loss of disc height at each of these 3 levels as well. IMPRESSION: 1. Borderline wall thickening in the rectum may be due to nondistention. Postoperative findings in the sigmoid colon without complicating feature. 2. Bilateral nonobstructive nephrolithiasis. 3. Mild prostatomegaly. 4. Lumbar spondylosis and degenerative disc disease causing bilateral foraminal impingement at L3-4, L4-5, and L5-S1. 5. Small left adrenal adenoma. 6. Small umbilical hernia contains adipose tissue. Aortic Atherosclerosis (ICD10-I70.0). Electronically Signed   By: WVan ClinesM.D.   On: 06/13/2019 09:44    Covid and influenza negative today   Active Problems:   * No active hospital problems. *Tye Savoy NP-C @  06/13/2019, 10:35 AM   I have reviewed the entire case in detail with the above APP and discussed the plan in detail.  Therefore, I agree with the diagnoses recorded above. In addition,  I have personally interviewed and examined the patient and have personally reviewed any abdominal/pelvic CT scan images.  My additional thoughts are as follows:  Patient seen earlier today when he was still in the emergency department. Chronic reports of loose stool going on for perhaps a year, recent office visit was reviewed, suspicions that maybe it was medication related.  He was having some abdominal pain as well, and after outpatient CT scan yesterday with oral and IV contrast, patient apparently had severe generalized but mostly lower abdominal pain,  nausea and vomiting, diarrhea (or perhaps passage of the oral contrast) and hematochezia.  He continued to have hematochezia in the emergency department, however his blood pressures remained stable, hemoglobin has gone from 15.2 to 14.6.  At this point, it is unclear how or if the acute symptoms yesterday are related to his more chronic GI complaints described at the recent office visit.  He is known to have internal hemorrhoids from last colonoscopy in 2017, but the reported blood he had today seemed more than would be expected for that. Perhaps he had an unusual reaction to the contrast.  Consider ischemic colitis.  I recommended the patient undergo colonoscopy with me tomorrow.  He was initially reluctant, with concerns he might not be able to tolerate the bowel preparation.  However, after I stressed the importance, he said he would do his best with it so we could evaluate the symptoms.  He was agreeable to a colonoscopy after discussion of procedure and risks.  The benefits and risks of the planned procedure were described in detail with the patient or (when appropriate) their  health care proxy.  Risks were outlined as including, but not limited to, bleeding, infection, perforation, adverse medication reaction leading to cardiac or pulmonary decompensation, pancreatitis (if ERCP).  The limitation of incomplete mucosal visualization was also discussed.  No guarantees or warranties were given.  I updated his wife by phone.  [name] Office:[phone]

## 2019-06-13 NOTE — ED Provider Notes (Signed)
[facility] DEPT Provider Note   CSN: [id number] Arrival date & time: 06/13/19  9937     History Chief Complaint  Patient presents with   Rectal Bleeding    Travis [name] is a 59 y.o. male.  59 yo male presenting with acute onset rectal bleeding. He has been following with GI for abdominal pain and loose stools for the past year. At last appointment with them the beginning of this month, they ordered a CT scan which he underwent yesterday. CT did not reveal a particular source of the bleed however did show some possible rectal wall thickening. Following the CT, patient began to feel bloated and then developed large volume rectal bleeding overnight with progressively worsening pain. Pain is primarily located in the lower quadrants and is described as cramping. Patient denies any recent fever, chills,  N/v. No exacerbation in pain with bowel movements. He does admit to history of hemrrhoids in the past however has not had an issue with constipation lately.  Of note, patient has underwent a number of abdominal surgeries in the past including partial sigmoidectomy and hernia repairs.        Past Medical History:  Diagnosis Date   A-fib ([place]) 02/2016   found on loop recorder   Adhesive capsulitis of left shoulder 08/02/2014   Anxiety    Arthritis    Blood transfusion without reported diagnosis    Chronic headaches    Complication of anesthesia    bleed after last shoulder-had to stay overnight   Depression    Diabetes mellitus    Diabetic peripheral neuropathy ([place]) 03/28/2013   Diverticulosis    Dizziness    Hernia, inguinal    Hyperlipidemia    Hypertension    Impingement syndrome of left shoulder 08/02/2014   Multi-infarct state 10/14/2014   Neuropathy of lower extremity    Night sweats    every once in a while   Sleep apnea    no CPAP   Stroke ([place]) 02/2015   Syncope and collapse     Patient Active Problem List   Diagnosis Date Noted   Vitamin D deficiency 04/18/2019   History of stroke 04/20/2018   Hypervitaminosis D 07/13/2017   Chronic pain of right knee 04/14/2017   Generalized anxiety disorder 05/15/2016   Acute bronchiolitis due to respiratory syncytial virus (RSV)    Acute CVA (cerebrovascular accident) ([place]) 05/04/2016   Dizziness 05/02/2016   Anxiety and depression 05/02/2016   Paroxysmal atrial fibrillation ([place]) 03/02/2016   PFO (patent foramen ovale) 07/24/2015   Chronic deep vein thrombosis (DVT) of popliteal vein of left lower extremity ([place]) 07/24/2015   OSA (obstructive sleep apnea) 04/18/2015   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 artery ([place]) 04/18/2015   Cerebellar stroke ([place]) 02/28/2015   Snoring 01/23/2015   Degeneration of cervical intervertebral disc 01/23/2015   Essential hypertension 12/14/2014   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   Mixed hyperlipidemia 12/14/2014   Neck pain 12/14/2014   Cerebral infarction, chronic    Vertigo 10/14/2014   Multi-infarct state 10/14/2014   Impingement syndrome of left shoulder 08/02/2014   Adhesive capsulitis of left shoulder 08/02/2014   Abdominal pain 06/13/2014   Rectal bleeding 03/13/2014   Diabetes type 2, controlled ([place]) 02/18/2014   Hyperlipidemia 02/18/2014   Diabetic peripheral neuropathy ([place]) 03/28/2013   Irreducible incisional hernia 11/11/2010    Past Surgical History:  Procedure Laterality Date   CERVICAL DISC SURGERY  COLON SURGERY  2003   1/3 removed for diverticulitis   COLONOSCOPY     EP IMPLANTABLE DEVICE N/A 05/01/2015   Procedure: Loop Recorder Insertion;  Surgeon: [name], MD;  Location: [facility];  Service: Cardiovascular;  Laterality: N/A;   INGUINAL HERNIA REPAIR Bilateral    KNEE ARTHROSCOPY Right    SHOULDER ARTHROSCOPY Right    1   SHOULDER ARTHROSCOPY Left 08/02/2014   Procedure: LEFT SHOULDER  SCOPE DEBRIDEMENT/ACROMIOPLASTY;  Surgeon: [name], MD;  Location: [facility];  Service: Orthopedics;  Laterality: Left;  ANESTHESIA: GENERAL, PRE/POST OP SCALENE   TEE WITHOUT CARDIOVERSION N/A 03/03/2015   Procedure: TRANSESOPHAGEAL ECHOCARDIOGRAM (TEE);  Surgeon: [name], MD;  [facility];  Service: Cardiology;  Laterality: N/A;   UMBILICAL HERNIA REPAIR     with other hernia repair with mesh   WRIST SURGERY Right    fusion       Family History  Problem Relation Age of Onset   Stroke Father    Hyperlipidemia Father    Heart attack Sister 47   Stroke Sister    Dementia Mother    ALS Brother        age 40   Diabetes Maternal Grandfather    Colon cancer Neg Hx    Esophageal cancer Neg Hx    Stomach cancer Neg Hx    Rectal cancer Neg Hx     Social History   Tobacco Use   Smoking status: Former Smoker    Packs/day: 1.00    Years: 8.00    Pack years: 8.00    Types: Cigarettes    Start date: 06/07/1970    Quit date: 05/03/1978    Years since quitting: 41.1   Smokeless tobacco: Former Systems developer    Types: Chew    Quit date: 05/03/1978  Substance Use Topics   Alcohol use: No    Alcohol/week: 1.0 standard drinks    Types: 1 Cans of beer per week    Comment: h/o heavy use in the past   Drug use: No    Home Medications Prior to Admission medications   Medication Sig Start Date End Date Taking? Authorizing Provider  ACCU-CHEK FASTCLIX LANCETS MISC 1 Dose by Does not apply route 4 (four) times daily. 05/22/18   Cassandria Anger, MD  ALPRAZolam Duanne Moron) 0.25 MG tablet TAKE 1 TABLET BY MOUTH TWO TIMES DAILY AS NEEDED 05/17/19   Kathyrn Drown, MD  apixaban (ELIQUIS) 5 MG TABS tablet Take 1 tablet (5 mg total) by mouth 2 (two) times daily. Appointment Required For Further Refills [phone] 06/12/19   Sherran Needs, NP  aspirin EC 81 MG tablet Take 1 tablet (81 mg total) by mouth daily. 05/12/16   Rosalin Hawking, MD    atorvastatin (LIPITOR) 20 MG tablet TAKE 1 TABLET BY MOUTH ONCE DAILY 04/30/19   Kathyrn Drown, MD  Blood Glucose Monitoring Suppl (BLOOD GLUCOSE MONITOR SYSTEM) w/Device KIT Test glucose 2 time daily.  Meter/strips/lancets. Please dispense per patient/insurance preferrence 05/18/18   Cassandria Anger, MD  Cholecalciferol 50 MCG (2000 UT) CAPS Take 1 capsule (2,000 Units total) by mouth daily with breakfast. 05/17/18   Nida, Marella Chimes, MD  folic acid (FOLVITE) 1 MG tablet TAKE 1 TABLET BY MOUTH DAILY. 03/15/19   Kathyrn Drown, MD  hyoscyamine (LEVSIN SL) 0.125 MG SL tablet Place 1 tablet (0.125 mg total) under the tongue daily before breakfast. 06/04/19   [place], Amy  S, PA-C  Insulin Detemir (LEVEMIR FLEXPEN [place]) Inject into the skin.    [provider]  Insulin Glargine (LANTUS SOLOSTAR) 100 UNIT/ML Solostar Pen INJECT 40 UNITS INTO THE SKIN AT BEDTIME. 04/18/19   Nida, Marella Chimes, MD  Insulin Pen Needle (PEN NEEDLES) 31G X 6 MM MISC 1 each by Does not apply route daily. 03/15/19   Cassandria Anger, MD  meclizine (ANTIVERT) 25 MG tablet TAKE 1 TABLET BY MOUTH 3 TIMES DAILY AS NEEDED FOR DIZZINESS 06/12/19   Kathyrn Drown, MD  metFORMIN (GLUCOPHAGE) 500 MG tablet TAKE 1 TABLET BY MOUTH TWO TIMES DAILY 06/12/19   Cassandria Anger, MD  Multiple Vitamin (MULTIVITAMIN WITH MINERALS) TABS tablet Take 1 tablet daily by mouth.    [provider]  pregabalin (LYRICA) 100 MG capsule TAKE 1 CAPSULE BY MOUTH 3 TIMES DAILY 05/17/19   Kathyrn Drown, MD  UNIFINE PENTIPS 31G X 8 MM MISC USE AS DIRECTED WITH LEVEMIR INJECTIONS DAILY 10/27/17   Kathyrn Drown, MD  valACYclovir (VALTREX) 1000 MG tablet TAKE 1 TABLET BY MOUTH ONCE DAILY 06/12/19   Kathyrn Drown, MD    Allergies    Lisinopril  Review of Systems   Review of Systems  Constitutional: Negative for chills and fever.  HENT: Negative.   Eyes: Negative.   Respiratory: Negative.   Cardiovascular: Negative.    Gastrointestinal: Positive for abdominal pain, blood in stool and diarrhea. Negative for constipation, nausea and rectal pain.  Genitourinary: Negative.   Musculoskeletal: Negative.   Skin: Negative.   Neurological: Negative.   Psychiatric/Behavioral: Negative.     Physical Exam Updated Vital Signs BP (!) 142/88    Pulse 74    Temp 97.6 F (36.4 C) (Oral)    Resp 16    Ht '6\' 1"'  (1.854 m)    Wt 86.2 kg    SpO2 95%    BMI 25.07 kg/m   Physical Exam Constitutional:      General: He is not in acute distress.    Appearance: He is not toxic-appearing.  HENT:     Head: Normocephalic.  Cardiovascular:     Rate and Rhythm: Normal rate and regular rhythm.  Pulmonary:     Effort: Pulmonary effort is normal.     Breath sounds: Normal breath sounds.  Abdominal:     General: Bowel sounds are normal. There is no distension.     Palpations: Abdomen is soft. There is no hepatomegaly or splenomegaly.     Tenderness: There is abdominal tenderness in the periumbilical area, left upper quadrant and left lower quadrant. There is left CVA tenderness. There is no rebound.  Musculoskeletal:        General: No signs of injury.  Skin:    General: Skin is warm and dry.  Neurological:     General: No focal deficit present.     Mental Status: He is alert.  Psychiatric:        Mood and Affect: Mood normal.     ED Results / Procedures / Treatments   Labs (all labs ordered are listed, but only abnormal results are displayed) Labs Reviewed  COMPREHENSIVE METABOLIC PANEL - Abnormal; Notable for the following components:      Result Value   Glucose, Bld 190 (*)    All other components within normal limits  CBC - Abnormal; Notable for the following components:   WBC 15.1 (*)    All other components within normal limits  POC OCCULT  BLOOD, ED - Abnormal; Notable for the following components:   Fecal Occult Bld POSITIVE (*)    All other components within normal limits  C DIFFICILE QUICK SCREEN W PCR  REFLEX  RESPIRATORY PANEL BY RT PCR (FLU A&B, COVID)  LIPASE, BLOOD  URINALYSIS, ROUTINE W REFLEX MICROSCOPIC  CBC  CBC  TYPE AND SCREEN  ABO/RH    EKG None  Radiology CT Abdomen Pelvis W Contrast  Result Date: 06/13/2019 CLINICAL DATA:  Altered bowel habits with loose stools and fecal urgency. Low back pain. EXAM: CT ABDOMEN AND PELVIS WITH CONTRAST TECHNIQUE: Multidetector CT imaging of the abdomen and pelvis was performed using the standard protocol following bolus administration of intravenous contrast. CONTRAST:  143m OMNIPAQUE IOHEXOL 300 MG/ML  SOLN COMPARISON:  Report from 10/01/2002 FINDINGS: Lower chest: Unremarkable Hepatobiliary: Unremarkable Pancreas: Unremarkable Spleen: Unremarkable Adrenals/Urinary Tract: Three nonobstructive right renal calculi are present, the largest 0.4 cm in the lower pole on image 33/4. A 0.1 cm left kidney lower pole nonobstructive renal calculus is present. No hydronephrosis or hydroureter. Mild scarring in the right kidney lower pole. Fluid density 1.1 cm exophytic lesion from the left mid kidney posterolaterally favors a cyst. Urinary bladder unremarkable. A 2.1 by 1.3 cm mass of the lateral limb of the left adrenal gland has a portal venous phase density of 58 Hounsfield units and a delayed phase density of 31 Hounsfield units, yielding a relative washout of 47% consistent with adenoma. Stomach/Bowel: Postoperative findings in the sigmoid colon. Orally administered contrast extends through to the distal colon. Borderline wall thickening in the rectum although this may be due to nondistention. No dilated small bowel. Normal appendix. Vascular/Lymphatic: Mild aortoiliac atherosclerotic vascular disease. Reproductive: Mild prostatomegaly. Other: No supplemental non-categorized findings. Musculoskeletal: Small umbilical hernia contains adipose tissue. Lumbar spondylosis and degenerative disc disease with degenerative retrolisthesis at L3-4 and L4-5. These  factors result and bilateral foraminal impingement at L3-4, L4-5, and L5-S1. Prominent loss of disc height at each of these 3 levels as well. IMPRESSION: 1. Borderline wall thickening in the rectum may be due to nondistention. Postoperative findings in the sigmoid colon without complicating feature. 2. Bilateral nonobstructive nephrolithiasis. 3. Mild prostatomegaly. 4. Lumbar spondylosis and degenerative disc disease causing bilateral foraminal impingement at L3-4, L4-5, and L5-S1. 5. Small left adrenal adenoma. 6. Small umbilical hernia contains adipose tissue. Aortic Atherosclerosis (ICD10-I70.0). Electronically Signed   By: WVan ClinesM.D.   On: 06/13/2019 09:44    Procedures Procedures (including critical care time)  Medications Ordered in ED Medications  0.9 %  sodium chloride infusion ( Intravenous New Bag/Given 06/13/19 1244)  ondansetron (ZOFRAN) injection 4 mg (4 mg Intravenous Given 06/13/19 1112)  morphine 4 MG/ML injection 4 mg (4 mg Intravenous Given 06/13/19 1112)    ED Course  I have reviewed the triage vital signs and the nursing notes.  Pertinent labs & imaging results that were available during my care of the patient were reviewed by me and considered in my medical decision making (see chart for details).  Clinical Course as of Jun 12 1248  Wed Jun 13, 2019  1039 Initial assessment. Year + hx of abdominal pain and diarrhea. Rectal bleeding began last night. Obtained read from yesterdays a/p CT which showed some possible rectal wall thickening vs nondistension as well as bilateral nonobstructive nephrolithiasis and a small left adrenal adenoma. No prior studies available for comparison.  Ddx includes diverticulitis vs internal hemrrhoid vs AVM. No concerning findings on CT for colorectal mass.   [  RC]  1102 Reassessed. Pain improved with morphine. Seen by GI who recommend admission and possible CTA. Hospitalist consulted.   [RC]  1115 Elevated white count raises concern  for diverticulitis. Fortunately hgb is stable at this time. GI consulted.    [RC]  95 Spoke with hospitalist team who agrees to admit. Covid test ordered.   [RC]    Clinical Course User Index [RC] Mitzi Hansen, MD   MDM Rules/Calculators/A&P                      Please see ED course for MDM.  Final Clinical Impression(s) / ED Diagnoses Final diagnoses:  Rectal bleeding  Abdominal pain, unspecified abdominal location    Rx / DC Orders ED Discharge Orders    None       Mitzi Hansen, MD 06/13/19 1250    Blanchie Dessert, MD 06/13/19 1315

## 2019-06-13 NOTE — H&P (View-Only) (Signed)
 Referring Provider:  [facility]         Primary Care Physician:  Luking, Scott A, MD Primary Gastroenterologist:  John Perry, MD            We were asked to see this patient for:   Rectal bleeding               ASSESSMENT /  PLAN     59 yo male currently under evaluation for bowel changes ( urgency and increased frequency) and low back pain. CT scan yesterday afternoon>>> borderline wall thickening of rectum vrs underdistention. Now in ED with leukocytosis and several episodes of rectal bleeding (on Eliquis) with associated diffuse abdominal discomfort .Symptoms started AFTER CT scan. --Could be ischemic colitis. Diverticular hemorrhage possibility though isn't usually associated with abdominal pain.  --Supportive care ( IV fluids, bowel rest). CT angiogram if bleeding persists.  --Monitor H+H, I expect hgb to drop given the amount of bleeding ( I saw blood in bedside commode).   --will start NS at 50 ml / hr as he is having ongoing bleeding and might need CT angiogram --Doubt infectious process but will at least check a C-diff ( mainly for more chronic bowel changes).    HPI:    Chief Complaint: rectal bleeding  Travis [name] is a 59 y.o. male with pmh significant for DM, DVT, CVA, HTN, pacemaker, sleep apnea, diverticulosis, sigmoid colon resection, . Evaluated in our office on 06/04/19 for bowel changes ( increased frequency) with urgency and back pain. Bowel changes felt possibly to be secondary to Metformin. Labs obtained, CT scan ordered and given trial of levsin. Wife called office this am stating that patient had nausea , vomiting and several episodes of rectal bleeding during the night. Sent to ED. Of note he has a history of intermittent rectal bleeding. Colonoscopy for evaluation of this in Oct 2017 was unremarkable except for internal hemorrhoids.   CT scan yesterday remarkable for adrenal adenoma, borderline wall thickening of rectum vrs underdistention, bilateral  non-obstructing nephrolithiasis, bilateral foraminal impingement of lower spine and a small umbilical hernia containing fat.   Patient says the bleeding started around 6 pm last night. He had ~ 20 episodes of hematochezia. He vomited non-blood emesis ~ 3 times. He felt very weak with all the bleeding. Today describes " bubbling" in stomach along with diffuse abdominal pain of 7 on scale of 10.   WBC 15.1 Hgb 15.2 Normal renal function Afebrile, VSS  Past Medical History:  Diagnosis Date  . A-fib (HCC) 02/2016   found on loop recorder  . 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 Anxiety   . Arthritis   . Blood transfusion without reported diagnosis   . Chronic headaches   . Complication of anesthesia    bleed after last shoulder-had to stay overnight  . Depression   . Diabetes mellitus   . Diabetic peripheral neuropathy (HCC) 03/28/2013  . Diverticulosis   . Hernia, inguinal   . Hyperlipidemia   . Hypertension   . Impingement syndrome of left shoulder 08/02/2014  . 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 Neuropathy of lower extremity   . Night sweats    every once in a while  . Sleep apnea    no CPAP  . Stroke (HCC) 02/2015  . Syncope and collapse     Past Surgical History:  Procedure Laterality Date  . CERVICAL DISC SURGERY    . COLON SURGERY  2003   1/3 removed for   diverticulitis  . COLONOSCOPY    . EP IMPLANTABLE DEVICE N/A 05/01/2015   Procedure: Loop Recorder Insertion;  Surgeon: Steven C Klein, MD;  Location: [facility];  Service: Cardiovascular;  Laterality: N/A;  . INGUINAL HERNIA REPAIR Bilateral   . KNEE ARTHROSCOPY Right   . SHOULDER ARTHROSCOPY Right    1  . SHOULDER ARTHROSCOPY Left 08/02/2014   Procedure: LEFT SHOULDER SCOPE DEBRIDEMENT/ACROMIOPLASTY;  Surgeon: Joshua Landau, MD;  Location: [facility];  Service: Orthopedics;  Laterality: Left;  ANESTHESIA: GENERAL, PRE/POST OP SCALENE  . TEE WITHOUT CARDIOVERSION N/A 03/03/2015    Procedure: TRANSESOPHAGEAL ECHOCARDIOGRAM (TEE);  Surgeon: Suresh A Koneswaran, MD;  Location: [facility];  Service: Cardiology;  Laterality: N/A;  . UMBILICAL HERNIA REPAIR     with other hernia repair with mesh  . WRIST SURGERY Right    fusion    Prior to Admission medications   Medication Sig Start Date End Date Taking? Authorizing Provider  ACCU-CHEK FASTCLIX LANCETS MISC 1 Dose by Does not apply route 4 (four) times daily. 05/22/18   Nida, Gebreselassie W, MD  ALPRAZolam (XANAX) 0.25 MG tablet TAKE 1 TABLET BY MOUTH TWO TIMES DAILY AS NEEDED 05/17/19   Luking, Scott A, MD  apixaban (ELIQUIS) 5 MG TABS tablet Take 1 tablet (5 mg total) by mouth 2 (two) times daily. Appointment Required For Further Refills [phone] 06/12/19   Carroll, Donna C, NP  aspirin EC 81 MG tablet Take 1 tablet (81 mg total) by mouth daily. 05/12/16   Xu, Jindong, MD  atorvastatin (LIPITOR) 20 MG tablet TAKE 1 TABLET BY MOUTH ONCE DAILY 04/30/19   Luking, Scott A, MD  Blood Glucose Monitoring Suppl (BLOOD GLUCOSE MONITOR SYSTEM) w/Device KIT Test glucose 2 time daily.  Meter/strips/lancets. Please dispense per patient/insurance preferrence 05/18/18   Nida, Gebreselassie W, MD  Cholecalciferol 50 MCG (2000 UT) CAPS Take 1 capsule (2,000 Units total) by mouth daily with breakfast. 05/17/18   Nida, Gebreselassie W, MD  folic acid (FOLVITE) 1 MG tablet TAKE 1 TABLET BY MOUTH DAILY. 03/15/19   Luking, Scott A, MD  hyoscyamine (LEVSIN SL) 0.125 MG SL tablet Place 1 tablet (0.125 mg total) under the tongue daily before breakfast. 06/04/19   Esterwood, Amy S, PA-C  Insulin Detemir (LEVEMIR FLEXPEN [place]) Inject into the skin.    [provider]  Insulin Glargine (LANTUS SOLOSTAR) 100 UNIT/ML Solostar Pen INJECT 40 UNITS INTO THE SKIN AT BEDTIME. 04/18/19   Nida, Gebreselassie W, MD  Insulin Pen Needle (PEN NEEDLES) 31G X 6 MM MISC 1 each by Does not apply route daily. 03/15/19   Nida, Gebreselassie W, MD  meclizine  (ANTIVERT) 25 MG tablet TAKE 1 TABLET BY MOUTH 3 TIMES DAILY AS NEEDED FOR DIZZINESS 06/12/19   Luking, Scott A, MD  metFORMIN (GLUCOPHAGE) 500 MG tablet TAKE 1 TABLET BY MOUTH TWO TIMES DAILY 06/12/19   Nida, Gebreselassie W, MD  Multiple Vitamin (MULTIVITAMIN WITH MINERALS) TABS tablet Take 1 tablet daily by mouth.    [provider]  pregabalin (LYRICA) 100 MG capsule TAKE 1 CAPSULE BY MOUTH 3 TIMES DAILY 05/17/19   Luking, Scott A, MD  UNIFINE PENTIPS 31G X 8 MM MISC USE AS DIRECTED WITH LEVEMIR INJECTIONS DAILY 10/27/17   Luking, Scott A, MD  valACYclovir (VALTREX) 1000 MG tablet TAKE 1 TABLET BY MOUTH ONCE DAILY 06/12/19   Luking, Scott A, MD    No current facility-administered medications for this encounter.   Current Outpatient Medications    Medication Sig Dispense Refill  . ACCU-CHEK FASTCLIX LANCETS MISC 1 Dose by Does not apply route 4 (four) times daily. 100 each 3  . ALPRAZolam (XANAX) 0.25 MG tablet TAKE 1 TABLET BY MOUTH TWO TIMES DAILY AS NEEDED 60 tablet 2  . apixaban (ELIQUIS) 5 MG TABS tablet Take 1 tablet (5 mg total) by mouth 2 (two) times daily. Appointment Required For Further Refills [phone] 60 tablet 0  . aspirin EC 81 MG tablet Take 1 tablet (81 mg total) by mouth daily.    Marland Kitchen atorvastatin (LIPITOR) 20 MG tablet TAKE 1 TABLET BY MOUTH ONCE DAILY 90 tablet 1  . Blood Glucose Monitoring Suppl (BLOOD GLUCOSE MONITOR SYSTEM) w/Device KIT Test glucose 2 time daily.  Meter/strips/lancets. Please dispense per patient/insurance preferrence 1 each 5  . Cholecalciferol 50 MCG (2000 UT) CAPS Take 1 capsule (2,000 Units total) by mouth daily with breakfast. 30 each 6  . folic acid (FOLVITE) 1 MG tablet TAKE 1 TABLET BY MOUTH DAILY. 90 tablet 1  . hyoscyamine (LEVSIN SL) 0.125 MG SL tablet Place 1 tablet (0.125 mg total) under the tongue daily before breakfast. 30 tablet 6  . Insulin Detemir (LEVEMIR FLEXPEN [place]) Inject into the skin.    . Insulin Glargine (LANTUS SOLOSTAR) 100  UNIT/ML Solostar Pen INJECT 40 UNITS INTO THE SKIN AT BEDTIME. 45 mL 0  . Insulin Pen Needle (PEN NEEDLES) 31G X 6 MM MISC 1 each by Does not apply route daily. 100 each 1  . meclizine (ANTIVERT) 25 MG tablet TAKE 1 TABLET BY MOUTH 3 TIMES DAILY AS NEEDED FOR DIZZINESS 30 tablet 5  . metFORMIN (GLUCOPHAGE) 500 MG tablet TAKE 1 TABLET BY MOUTH TWO TIMES DAILY 180 tablet 0  . Multiple Vitamin (MULTIVITAMIN WITH MINERALS) TABS tablet Take 1 tablet daily by mouth.    . pregabalin (LYRICA) 100 MG capsule TAKE 1 CAPSULE BY MOUTH 3 TIMES DAILY 90 capsule 2  . UNIFINE PENTIPS 31G X 8 MM MISC USE AS DIRECTED WITH LEVEMIR INJECTIONS DAILY 100 each 0  . valACYclovir (VALTREX) 1000 MG tablet TAKE 1 TABLET BY MOUTH ONCE DAILY 30 tablet 5    Allergies as of 06/13/2019 - Review Complete 06/13/2019  Allergen Reaction Noted  . Lisinopril Cough 09/30/2016    Family History  Problem Relation Age of Onset  . Stroke Father   . Hyperlipidemia Father   . Heart attack Sister 80  . Stroke Sister   . Dementia Mother   . ALS Brother        age 44  . Diabetes Maternal Grandfather   . Colon cancer Neg Hx   . Esophageal cancer Neg Hx   . Stomach cancer Neg Hx   . Rectal cancer Neg Hx     Social History   Socioeconomic History  . Marital status: Married    Spouse name: Not on file  . Number of children: 2  . Years of education: College  . Highest education level: Not on file  Occupational History  . Occupation: disabled  Tobacco Use  . Smoking status: Former Smoker    Packs/day: 1.00    Years: 8.00    Pack years: 8.00    Types: Cigarettes    Start date: 06/07/1970    Quit date: 05/03/1978    Years since quitting: 41.1  . Smokeless tobacco: Former Systems developer    Types: [place] date: 05/03/1978  Substance and Sexual Activity  . Alcohol use: No    Alcohol/week:  1.0 standard drinks    Types: 1 Cans of beer per week    Comment: h/o heavy use in the past  . Drug use: No  . Sexual activity: Not on file   Other Topics Concern  . Not on file  Social History Narrative   Drinks some coffee, Drink diet sodas and tea   Social Determinants of Health   Financial Resource Strain:   . Difficulty of Paying Living Expenses: Not on file  Food Insecurity:   . Worried About Running Out of Food in the Last Year: Not on file  . Ran Out of Food in the Last Year: Not on file  Transportation Needs:   . Lack of Transportation (Medical): Not on file  . Lack of Transportation (Non-Medical): Not on file  Physical Activity:   . Days of Exercise per Week: Not on file  . Minutes of Exercise per Session: Not on file  Stress:   . Feeling of Stress : Not on file  Social Connections:   . Frequency of Communication with Friends and Family: Not on file  . Frequency of Social Gatherings with Friends and Family: Not on file  . Attends Religious Services: Not on file  . Active Member of Clubs or Organizations: Not on file  . Attends Club or Organization Meetings: Not on file  . Marital Status: Not on file  Intimate Partner Violence:   . Fear of Current or Ex-Partner: Not on file  . Emotionally Abused: Not on file  . Physically Abused: Not on file  . Sexually Abused: Not on file    Review of Systems: All systems reviewed and negative except where noted in HPI.  Physical Exam: Vital signs in last 24 hours: Temp:  [97.6 F (36.4 C)] 97.6 F (36.4 C) (02/10 1012) Pulse Rate:  [82] 82 (02/10 1012) Resp:  [11] 11 (02/10 1012) BP: (146)/(90) 146/90 (02/10 1012) SpO2:  [97 %-98 %] 98 % (02/10 1017) Weight:  [86.2 kg] 86.2 kg (02/10 1017)   General:   Alert, well-developed,  male in NAD Psych:  Pleasant, cooperative. Normal mood and affect. Eyes:  Pupils equal, sclera clear, no icterus.   Conjunctiva pink. Ears:  Normal auditory acuity. Nose:  No deformity, discharge,  or lesions. Neck:  Supple; no masses Lungs:  Clear throughout to auscultation.   Occas inspiratory wheeze.   Heart:  Regular rate and  rhythm; no lower extremity edema Abdomen:  Soft, non-distended, nontender, BS active, no palp mass   Rectal:  Deferred  Msk:  Symmetrical without gross deformities. . Neurologic:  Alert and  oriented x4;  grossly normal neurologically. Skin:  Intact without significant lesions or rashes.  **Large amount of medium red blood of jelly consistency in bedside commode.   Intake/Output from previous day: No intake/output data recorded. Intake/Output this shift: No intake/output data recorded.  Lab Results: Recent Labs    06/13/19 1020  WBC 15.1*  HGB 15.2  HCT 43.0  PLT 183     . CBC Latest Ref Rng & Units 06/13/2019 06/04/2019 03/15/2017  WBC 4.0 - 10.5 K/uL 15.1(H) 7.5 5.5  Hemoglobin 13.0 - 17.0 g/dL 15.2 14.8 14.0  Hematocrit 39.0 - 52.0 % 43.0 42.1 39.4  Platelets 150 - 400 K/uL 183 193.0 158    . CMP Latest Ref Rng & Units 06/04/2019 12/08/2018 04/22/2018  Glucose 70 - 99 mg/dL 142(H) 155(H) 227(H)  BUN 6 - 23 mg/dL 15 10 14  Creatinine 0.40 - 1.50 mg/dL 0.92 0.93   1.03  Sodium 135 - 145 mEq/L 138 139 140  Potassium 3.5 - 5.1 mEq/L 3.9 4.4 4.1  Chloride 96 - 112 mEq/L 104 102 101  CO2 19 - 32 mEq/L 28 24 22  Calcium 8.4 - 10.5 mg/dL 9.5 9.2 9.3  Total Protein 6.0 - 8.5 g/dL - 6.6 7.1  Total Bilirubin 0.0 - 1.2 mg/dL - 0.4 0.7  Alkaline Phos 39 - 117 IU/L - 68 80  AST 0 - 40 IU/L - 16 19  ALT 0 - 44 IU/L - 7 10   Studies/Results: CT Abdomen Pelvis W Contrast  Result Date: 06/13/2019 CLINICAL DATA:  Altered bowel habits with loose stools and fecal urgency. Low back pain. EXAM: CT ABDOMEN AND PELVIS WITH CONTRAST TECHNIQUE: Multidetector CT imaging of the abdomen and pelvis was performed using the standard protocol following bolus administration of intravenous contrast. CONTRAST:  100mL OMNIPAQUE IOHEXOL 300 MG/ML  SOLN COMPARISON:  Report from 10/01/2002 FINDINGS: Lower chest: Unremarkable Hepatobiliary: Unremarkable Pancreas: Unremarkable Spleen: Unremarkable  Adrenals/Urinary Tract: Three nonobstructive right renal calculi are present, the largest 0.4 cm in the lower pole on image 33/4. A 0.1 cm left kidney lower pole nonobstructive renal calculus is present. No hydronephrosis or hydroureter. Mild scarring in the right kidney lower pole. Fluid density 1.1 cm exophytic lesion from the left mid kidney posterolaterally favors a cyst. Urinary bladder unremarkable. A 2.1 by 1.3 cm mass of the lateral limb of the left adrenal gland has a portal venous phase density of 58 Hounsfield units and a delayed phase density of 31 Hounsfield units, yielding a relative washout of 47% consistent with adenoma. Stomach/Bowel: Postoperative findings in the sigmoid colon. Orally administered contrast extends through to the distal colon. Borderline wall thickening in the rectum although this may be due to nondistention. No dilated small bowel. Normal appendix. Vascular/Lymphatic: Mild aortoiliac atherosclerotic vascular disease. Reproductive: Mild prostatomegaly. Other: No supplemental non-categorized findings. Musculoskeletal: Small umbilical hernia contains adipose tissue. Lumbar spondylosis and degenerative disc disease with degenerative retrolisthesis at L3-4 and L4-5. These factors result and bilateral foraminal impingement at L3-4, L4-5, and L5-S1. Prominent loss of disc height at each of these 3 levels as well. IMPRESSION: 1. Borderline wall thickening in the rectum may be due to nondistention. Postoperative findings in the sigmoid colon without complicating feature. 2. Bilateral nonobstructive nephrolithiasis. 3. Mild prostatomegaly. 4. Lumbar spondylosis and degenerative disc disease causing bilateral foraminal impingement at L3-4, L4-5, and L5-S1. 5. Small left adrenal adenoma. 6. Small umbilical hernia contains adipose tissue. Aortic Atherosclerosis (ICD10-I70.0). Electronically Signed   By: Walter  Liebkemann M.D.   On: 06/13/2019 09:44    Covid and influenza negative today   Active Problems:   * No active hospital problems. *    Paula Guenther, NP-C @  06/13/2019, 10:35 AM   I have reviewed the entire case in detail with the above APP and discussed the plan in detail.  Therefore, I agree with the diagnoses recorded above. In addition,  I have personally interviewed and examined the patient and have personally reviewed any abdominal/pelvic CT scan images.  My additional thoughts are as follows:  Patient seen earlier today when he was still in the emergency department. Chronic reports of loose stool going on for perhaps a year, recent office visit was reviewed, suspicions that maybe it was medication related.  He was having some abdominal pain as well, and after outpatient CT scan yesterday with oral and IV contrast, patient apparently had severe generalized but mostly lower abdominal pain,   nausea and vomiting, diarrhea (or perhaps passage of the oral contrast) and hematochezia.  He continued to have hematochezia in the emergency department, however his blood pressures remained stable, hemoglobin has gone from 15.2 to 14.6.  At this point, it is unclear how or if the acute symptoms yesterday are related to his more chronic GI complaints described at the recent office visit.  He is known to have internal hemorrhoids from last colonoscopy in 2017, but the reported blood he had today seemed more than would be expected for that. Perhaps he had an unusual reaction to the contrast.  Consider ischemic colitis.  I recommended the patient undergo colonoscopy with me tomorrow.  He was initially reluctant, with concerns he might not be able to tolerate the bowel preparation.  However, after I stressed the importance, he said he would do his best with it so we could evaluate the symptoms.  He was agreeable to a colonoscopy after discussion of procedure and risks.  The benefits and risks of the planned procedure were described in detail with the patient or (when appropriate) their  health care proxy.  Risks were outlined as including, but not limited to, bleeding, infection, perforation, adverse medication reaction leading to cardiac or pulmonary decompensation, pancreatitis (if ERCP).  The limitation of incomplete mucosal visualization was also discussed.  No guarantees or warranties were given.  I updated his wife by phone.  Rogan Ecklund L Danis III Office:[phone]   

## 2019-06-13 NOTE — Progress Notes (Signed)
The patient's wife came to our office, across the street from the [place] because she was having a difficult time getting information from the emergency room staff.  She was clearly worried about his condition given the blood loss that he had experienced earlier today.  I met with her briefly after having reviewed the hospital data from today as well as the consult note from Tye Savoy, NP.  She is listed as contact in the medical record and has been granted prior access to his records with his consent. I shared with her that it looks as he will be admitted for observation for presumed lower GI bleed of unclear etiology at this moment. We did review briefly the CT findings which was performed prior to onset of more acute symptoms overnight. I let her know that she would likely hear from the physicians taking care of him in the hospital and that GI would follow along during this admission until his GI issues are stable.  She thanked me for the time

## 2019-06-13 NOTE — ED Triage Notes (Signed)
Patient states he has had multiple episodes of bloody bowel movements since last night. endorses gas, abdominal pain and bright red blood.

## 2019-06-13 NOTE — ED Notes (Signed)
Attempted to call report, no response. RN will call back when available.

## 2019-06-13 NOTE — H&P (Signed)
History and Physical    Travis [name]:[id number] DOB: 04/10/1961 DOA: 06/13/2019  PCP: Kathyrn Drown, MD  Patient coming from: Home.  I have personally briefly reviewed patient's old medical records in [place]  Chief Complaint: Bloody bowel movements.  HPI: Travis [name] is a 59 y.o. male with medical history significant paroxysmal atrial fibrillation, anxiety/depression, osteoarthritis, type 2 diabetes, diabetic neuropathy, diverticulosis, inguinal hernia, hyperlipidemia, hypertension, sleep apnea on CPAP, history of CVA, history of syncope and collapse who is coming to the emergency department with complaints of rectal bleeding associated with abdominal pain and flatulence since yesterday evening.  The pain has been so intense, but he has been unable to sleep.  He had one episode of emesis early morning, but denies hematemesis.  No dysuria, frequency or hematuria.  He denies fever, chills, sore throat or rhinorrhea.  No dyspnea, wheezing or hemoptysis.  No chest pain, palpitations, dizziness, diaphoresis, PND, orthopnea or pitting edema lower extremities.  Denies polyuria, polydipsia, polyphagia or blurred vision.  ED Course: Initial vital signs temperature 97.6 F, pulse 80, respiration 11, blood pressure 146/90 mmHg O2 sat 97% on room air.  The patient received morphine and ondansetron in the emergency department.  Fecal occult blood was positive.  White count was 15.1, hemoglobin 15.2 g/dL and platelets 183.  CMP showed a glucose of 190, was otherwise normal.  Lipase was normal as well.  SARS coronavirus and influenza a and B by PCR was negative.  C. difficile toxin was negative.  CT abdomen shows borderline wall thickening in the rectum may be due to known distention.  Review of Systems: As per HPI otherwise 10 point review of systems negative.   Past Medical History:  Diagnosis Date  . A-fib ([place]) 02/2016   found on loop recorder  . Adhesive capsulitis of left  shoulder 08/02/2014  . Anxiety   . Arthritis   . Blood transfusion without reported diagnosis   . Chronic headaches   . Complication of anesthesia    bleed after last shoulder-had to stay overnight  . Depression   . Diabetes mellitus   . Diabetic peripheral neuropathy ([place]) 03/28/2013  . Diverticulosis   . Dizziness   . Hernia, inguinal   . Hyperlipidemia   . Hypertension   . Impingement syndrome of left shoulder 08/02/2014  . 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 Neuropathy of lower extremity   . Night sweats    every once in a while  . Sleep apnea    no CPAP  . Stroke ([place]) 02/2015  . Syncope and collapse     Past Surgical History:  Procedure Laterality Date  . CERVICAL DISC SURGERY    . COLON SURGERY  2003   1/3 removed for diverticulitis  . COLONOSCOPY    . EP IMPLANTABLE DEVICE N/A 05/01/2015   Procedure: Loop Recorder Insertion;  Surgeon: [name], MD;  Location: [facility];  Service: Cardiovascular;  Laterality: N/A;  . INGUINAL HERNIA REPAIR Bilateral   . KNEE ARTHROSCOPY Right   . SHOULDER ARTHROSCOPY Right    1  . SHOULDER ARTHROSCOPY Left 08/02/2014   Procedure: LEFT SHOULDER SCOPE DEBRIDEMENT/ACROMIOPLASTY;  Surgeon: [name], MD;  Location: [facility];  Service: Orthopedics;  Laterality: Left;  ANESTHESIA: GENERAL, PRE/POST OP SCALENE  . TEE WITHOUT CARDIOVERSION N/A 03/03/2015   Procedure: TRANSESOPHAGEAL ECHOCARDIOGRAM (TEE);  Surgeon: [name], MD;  [facility];  Service: Cardiology;  Laterality: N/A;  . UMBILICAL  HERNIA REPAIR     with other hernia repair with mesh  . WRIST SURGERY Right    fusion     reports that he quit smoking about 41 years ago. His smoking use included cigarettes. He started smoking about 49 years ago. He has a 8.00 pack-year smoking history. He quit smokeless tobacco use about 41 years ago.  His smokeless tobacco use included chew. He reports that he does not drink alcohol or use  drugs.  Allergies  Allergen Reactions  . Lisinopril Cough    Patient/spouse is not aware/familiar with why this is listed as an allergy    Family History  Problem Relation Age of Onset  . Stroke Father   . Hyperlipidemia Father   . Heart attack Sister 57  . Stroke Sister   . Dementia Mother   . ALS Brother        age 24  . Diabetes Maternal Grandfather   . Colon cancer Neg Hx   . Esophageal cancer Neg Hx   . Stomach cancer Neg Hx   . Rectal cancer Neg Hx    Prior to Admission medications   Medication Sig Start Date End Date Taking? Authorizing Provider  ACCU-CHEK FASTCLIX LANCETS MISC 1 Dose by Does not apply route 4 (four) times daily. 05/22/18   Cassandria Anger, MD  ALPRAZolam Duanne Moron) 0.25 MG tablet TAKE 1 TABLET BY MOUTH TWO TIMES DAILY AS NEEDED 05/17/19   Kathyrn Drown, MD  apixaban (ELIQUIS) 5 MG TABS tablet Take 1 tablet (5 mg total) by mouth 2 (two) times daily. Appointment Required For Further Refills [phone] 06/12/19   Sherran Needs, NP  aspirin EC 81 MG tablet Take 1 tablet (81 mg total) by mouth daily. 05/12/16   Rosalin Hawking, MD  atorvastatin (LIPITOR) 20 MG tablet TAKE 1 TABLET BY MOUTH ONCE DAILY 04/30/19   Kathyrn Drown, MD  Blood Glucose Monitoring Suppl (BLOOD GLUCOSE MONITOR SYSTEM) w/Device KIT Test glucose 2 time daily.  Meter/strips/lancets. Please dispense per patient/insurance preferrence 05/18/18   Cassandria Anger, MD  Cholecalciferol 50 MCG (2000 UT) CAPS Take 1 capsule (2,000 Units total) by mouth daily with breakfast. 05/17/18   Nida, Marella Chimes, MD  folic acid (FOLVITE) 1 MG tablet TAKE 1 TABLET BY MOUTH DAILY. 03/15/19   Kathyrn Drown, MD  hyoscyamine (LEVSIN SL) 0.125 MG SL tablet Place 1 tablet (0.125 mg total) under the tongue daily before breakfast. 06/04/19   Esterwood, Amy S, PA-C  Insulin Detemir (LEVEMIR FLEXPEN [place]) Inject into the skin.    [provider]  Insulin Glargine (LANTUS SOLOSTAR) 100 UNIT/ML Solostar  Pen INJECT 40 UNITS INTO THE SKIN AT BEDTIME. 04/18/19   Nida, Marella Chimes, MD  Insulin Pen Needle (PEN NEEDLES) 31G X 6 MM MISC 1 each by Does not apply route daily. 03/15/19   Cassandria Anger, MD  meclizine (ANTIVERT) 25 MG tablet TAKE 1 TABLET BY MOUTH 3 TIMES DAILY AS NEEDED FOR DIZZINESS 06/12/19   Kathyrn Drown, MD  metFORMIN (GLUCOPHAGE) 500 MG tablet TAKE 1 TABLET BY MOUTH TWO TIMES DAILY 06/12/19   Cassandria Anger, MD  Multiple Vitamin (MULTIVITAMIN WITH MINERALS) TABS tablet Take 1 tablet daily by mouth.    [provider]  pregabalin (LYRICA) 100 MG capsule TAKE 1 CAPSULE BY MOUTH 3 TIMES DAILY 05/17/19   Kathyrn Drown, MD  UNIFINE PENTIPS 31G X 8 MM MISC USE AS DIRECTED WITH LEVEMIR INJECTIONS DAILY 10/27/17   Luking,  Elayne Snare, MD  valACYclovir (VALTREX) 1000 MG tablet TAKE 1 TABLET BY MOUTH ONCE DAILY 06/12/19   Kathyrn Drown, MD    Physical Exam: Vitals:   06/13/19 1231 06/13/19 1236 06/13/19 1300 06/13/19 1448  BP:  136/89 (!) 148/90 (!) 140/98  Pulse: 74 78 67 64  Resp: _0 Temp:    98.4 F (36.9 C)  TempSrc:    Oral  SpO2: 95% 95% 99% 98%  Weight:      Height:        Constitutional: NAD, calm, comfortable Eyes: PERRL, lids and conjunctivae normal ENMT: Mucous membranes are moist. Posterior pharynx clear of any exudate or lesions. Neck: normal, supple, no masses, no thyromegaly Respiratory: clear to auscultation bilaterally, no wheezing, no crackles. Normal respiratory effort. No accessory muscle use.  Cardiovascular: Regular rate and rhythm, no murmurs / rubs / gallops. No extremity edema. 2+ pedal pulses. No carotid bruits.  Abdomen: Nondistended.  BS positive.  Soft, positive LLQ tenderness, no guarding or rebound, no masses palpated. No hepatosplenomegaly. Musculoskeletal: no clubbing / cyanosis.  Good ROM, no contractures. Normal muscle tone.  Skin: no significant rashes, lesions, ulcers on limited dermatological  examination. Neurologic: CN 2-12 grossly intact. Sensation intact, DTR normal. Strength 5/5 in all 4.  Psychiatric: Normal judgment and insight. Alert and oriented x 3. Normal mood.   Labs on Admission: I have personally reviewed following labs and imaging studies  CBC: Recent Labs  Lab 06/13/19 1020 06/13/19 1237  WBC 15.1* 14.0*  HGB 15.2 14.5  HCT 43.0 41.0  MCV 94.3 93.6  PLT 183 106   Basic Metabolic Panel: Recent Labs  Lab 06/13/19 1020  NA 141  K 3.6  CL 106  CO2 26  GLUCOSE 190*  BUN 19  CREATININE 1.01  CALCIUM 8.9  MG 2.0  PHOS 3.2   GFR: Estimated Creatinine Clearance: 86.8 mL/min (by C-G formula based on SCr of 1.01 mg/dL). Liver Function Tests: Recent Labs  Lab 06/13/19 1020  AST 16  ALT 11  ALKPHOS 63  BILITOT 0.8  PROT 7.5  ALBUMIN 4.5   Recent Labs  Lab 06/13/19 1020  LIPASE 32   No results for input(s): AMMONIA in the last 168 hours. Coagulation Profile: No results for input(s): INR, PROTIME in the last 168 hours. Cardiac Enzymes: No results for input(s): CKTOTAL, CKMB, CKMBINDEX, TROPONINI in the last 168 hours. BNP (last 3 results) No results for input(s): PROBNP in the last 8760 hours. HbA1C: No results for input(s): HGBA1C in the last 72 hours. CBG: No results for input(s): GLUCAP in the last 168 hours. Lipid Profile: No results for input(s): CHOL, HDL, LDLCALC, TRIG, CHOLHDL, LDLDIRECT in the last 72 hours. Thyroid Function Tests: No results for input(s): TSH, T4TOTAL, FREET4, T3FREE, THYROIDAB in the last 72 hours. Anemia Panel: No results for input(s): VITAMINB12, FOLATE, FERRITIN, TIBC, IRON, RETICCTPCT in the last 72 hours. Urine analysis:    Component Value Date/Time   COLORURINE YELLOW 03/15/2017 1508   APPEARANCEUR CLEAR 03/15/2017 1508   LABSPEC 1.021 03/15/2017 1508   PHURINE 5.0 03/15/2017 1508   GLUCOSEU >=500 (A) 03/15/2017 1508   HGBUR NEGATIVE 03/15/2017 1508   BILIRUBINUR NEGATIVE 03/15/2017 1508    KETONESUR NEGATIVE 03/15/2017 1508   PROTEINUR NEGATIVE 03/15/2017 1508   UROBILINOGEN 0.2 02/28/2015 1750   NITRITE NEGATIVE 03/15/2017 1508   LEUKOCYTESUR NEGATIVE 03/15/2017 1508    Radiological Exams on Admission: CT Abdomen Pelvis W Contrast  Result Date: 06/13/2019 CLINICAL  DATA:  Altered bowel habits with loose stools and fecal urgency. Low back pain. EXAM: CT ABDOMEN AND PELVIS WITH CONTRAST TECHNIQUE: Multidetector CT imaging of the abdomen and pelvis was performed using the standard protocol following bolus administration of intravenous contrast. CONTRAST:  114m OMNIPAQUE IOHEXOL 300 MG/ML  SOLN COMPARISON:  Report from 10/01/2002 FINDINGS: Lower chest: Unremarkable Hepatobiliary: Unremarkable Pancreas: Unremarkable Spleen: Unremarkable Adrenals/Urinary Tract: Three nonobstructive right renal calculi are present, the largest 0.4 cm in the lower pole on image 33/4. A 0.1 cm left kidney lower pole nonobstructive renal calculus is present. No hydronephrosis or hydroureter. Mild scarring in the right kidney lower pole. Fluid density 1.1 cm exophytic lesion from the left mid kidney posterolaterally favors a cyst. Urinary bladder unremarkable. A 2.1 by 1.3 cm mass of the lateral limb of the left adrenal gland has a portal venous phase density of 58 Hounsfield units and a delayed phase density of 31 Hounsfield units, yielding a relative washout of 47% consistent with adenoma. Stomach/Bowel: Postoperative findings in the sigmoid colon. Orally administered contrast extends through to the distal colon. Borderline wall thickening in the rectum although this may be due to nondistention. No dilated small bowel. Normal appendix. Vascular/Lymphatic: Mild aortoiliac atherosclerotic vascular disease. Reproductive: Mild prostatomegaly. Other: No supplemental non-categorized findings. Musculoskeletal: Small umbilical hernia contains adipose tissue. Lumbar spondylosis and degenerative disc disease with degenerative  retrolisthesis at L3-4 and L4-5. These factors result and bilateral foraminal impingement at L3-4, L4-5, and L5-S1. Prominent loss of disc height at each of these 3 levels as well. IMPRESSION: 1. Borderline wall thickening in the rectum may be due to nondistention. Postoperative findings in the sigmoid colon without complicating feature. 2. Bilateral nonobstructive nephrolithiasis. 3. Mild prostatomegaly. 4. Lumbar spondylosis and degenerative disc disease causing bilateral foraminal impingement at L3-4, L4-5, and L5-S1. 5. Small left adrenal adenoma. 6. Small umbilical hernia contains adipose tissue. Aortic Atherosclerosis (ICD10-I70.0). Electronically Signed   By: WVan ClinesM.D.   On: 06/13/2019 09:44    EKG: Independently reviewed.  Assessment/Plan Principal Problem:   Rectal bleeding Observation/telemetry. Keep n.p.o. Monitor H&H serially. Analgesics as needed. Antiemetics as needed. Suspect diverticulitis. Ciprofloxacin 400 mg IVPB every 12 hours. Flagyl 500 mg IVPB every 8 hours. GI is following.  Active Problems:   Diabetic peripheral neuropathy (HCC) Continue Lyrica 100 mg p.o. 3 times daily.    Essential hypertension Monitor blood pressure.    DM type 2 causing vascular disease (HCC) Currently n.p.o. Hold Lantus. Hold Metformin. CBG monitoring every 6 hours with R ISS.    Mixed hyperlipidemia On atorvastatin.    OSA (obstructive sleep apnea) Continue CPAP at bedtime.    Paroxysmal atrial fibrillation (HCC) CHA?DS?-VASc Score of at least 5. Hold apixaban and aspirin.   DVT prophylaxis: SCDs. Code Status: Full code. Family Communication: Disposition Plan: Admit for pain control, IV antibiotics, GI evaluation. Consults called: Gastroenterology (Dr. JScarlette Shorts Admission status: Telemetry/observation.   DReubin MilanMD [facility]  If 7PM-7AM, please contact night-coverage [email or web address]  06/13/2019, 3:03 PM   This document was  prepared using Dragon voice recognition software and may contain some unintended transcription errors.

## 2019-06-14 ENCOUNTER — Observation Stay (HOSPITAL_COMMUNITY): Payer: No Typology Code available for payment source | Admitting: Registered Nurse

## 2019-06-14 ENCOUNTER — Encounter (HOSPITAL_COMMUNITY)
Admission: EM | Disposition: A | Discharge status: Home / Self Care | Payer: Self-pay | Source: Home / Self Care | Attending: Internal Medicine

## 2019-06-14 ENCOUNTER — Encounter (HOSPITAL_COMMUNITY): Payer: Self-pay | Admitting: Internal Medicine

## 2019-06-14 DIAGNOSIS — R109 Unspecified abdominal pain: Secondary | ICD-10-CM

## 2019-06-14 DIAGNOSIS — Z981 Arthrodesis status: Secondary | ICD-10-CM | POA: Diagnosis not present

## 2019-06-14 DIAGNOSIS — F329 Major depressive disorder, single episode, unspecified: Secondary | ICD-10-CM | POA: Diagnosis present

## 2019-06-14 DIAGNOSIS — G8929 Other chronic pain: Secondary | ICD-10-CM | POA: Diagnosis present

## 2019-06-14 DIAGNOSIS — M1990 Unspecified osteoarthritis, unspecified site: Secondary | ICD-10-CM | POA: Diagnosis present

## 2019-06-14 DIAGNOSIS — M25561 Pain in right knee: Secondary | ICD-10-CM | POA: Diagnosis present

## 2019-06-14 DIAGNOSIS — E1159 Type 2 diabetes mellitus with other circulatory complications: Secondary | ICD-10-CM

## 2019-06-14 DIAGNOSIS — R1084 Generalized abdominal pain: Secondary | ICD-10-CM | POA: Diagnosis not present

## 2019-06-14 DIAGNOSIS — Z8719 Personal history of other diseases of the digestive system: Secondary | ICD-10-CM | POA: Diagnosis not present

## 2019-06-14 DIAGNOSIS — K625 Hemorrhage of anus and rectum: Secondary | ICD-10-CM | POA: Diagnosis present

## 2019-06-14 DIAGNOSIS — E559 Vitamin D deficiency, unspecified: Secondary | ICD-10-CM | POA: Diagnosis present

## 2019-06-14 DIAGNOSIS — K648 Other hemorrhoids: Secondary | ICD-10-CM | POA: Diagnosis present

## 2019-06-14 DIAGNOSIS — I10 Essential (primary) hypertension: Secondary | ICD-10-CM | POA: Diagnosis present

## 2019-06-14 DIAGNOSIS — E1142 Type 2 diabetes mellitus with diabetic polyneuropathy: Secondary | ICD-10-CM | POA: Diagnosis present

## 2019-06-14 DIAGNOSIS — D3502 Benign neoplasm of left adrenal gland: Secondary | ICD-10-CM | POA: Diagnosis present

## 2019-06-14 DIAGNOSIS — K922 Gastrointestinal hemorrhage, unspecified: Secondary | ICD-10-CM | POA: Diagnosis present

## 2019-06-14 DIAGNOSIS — K55032 Diffuse acute (reversible) ischemia of large intestine: Secondary | ICD-10-CM | POA: Diagnosis present

## 2019-06-14 DIAGNOSIS — N200 Calculus of kidney: Secondary | ICD-10-CM | POA: Diagnosis present

## 2019-06-14 DIAGNOSIS — I480 Paroxysmal atrial fibrillation: Secondary | ICD-10-CM | POA: Diagnosis present

## 2019-06-14 DIAGNOSIS — G4733 Obstructive sleep apnea (adult) (pediatric): Secondary | ICD-10-CM | POA: Diagnosis present

## 2019-06-14 DIAGNOSIS — Z20822 Contact with and (suspected) exposure to covid-19: Secondary | ICD-10-CM | POA: Diagnosis present

## 2019-06-14 DIAGNOSIS — Z95818 Presence of other cardiac implants and grafts: Secondary | ICD-10-CM | POA: Diagnosis not present

## 2019-06-14 DIAGNOSIS — Z9049 Acquired absence of other specified parts of digestive tract: Secondary | ICD-10-CM | POA: Diagnosis not present

## 2019-06-14 DIAGNOSIS — K633 Ulcer of intestine: Secondary | ICD-10-CM | POA: Diagnosis present

## 2019-06-14 DIAGNOSIS — Z980 Intestinal bypass and anastomosis status: Secondary | ICD-10-CM | POA: Diagnosis not present

## 2019-06-14 DIAGNOSIS — K559 Vascular disorder of intestine, unspecified: Secondary | ICD-10-CM | POA: Diagnosis not present

## 2019-06-14 DIAGNOSIS — F411 Generalized anxiety disorder: Secondary | ICD-10-CM | POA: Diagnosis present

## 2019-06-14 DIAGNOSIS — E782 Mixed hyperlipidemia: Secondary | ICD-10-CM | POA: Diagnosis present

## 2019-06-14 DIAGNOSIS — K429 Umbilical hernia without obstruction or gangrene: Secondary | ICD-10-CM | POA: Diagnosis present

## 2019-06-14 DIAGNOSIS — Z7901 Long term (current) use of anticoagulants: Secondary | ICD-10-CM | POA: Diagnosis not present

## 2019-06-14 HISTORY — PX: BIOPSY: SHX5522

## 2019-06-14 HISTORY — PX: COLONOSCOPY WITH PROPOFOL: SHX5780

## 2019-06-14 LAB — COMPREHENSIVE METABOLIC PANEL
ALT: 11 U/L (ref 0–44)
AST: 15 U/L (ref 15–41)
Albumin: 3.8 g/dL (ref 3.5–5.0)
Alkaline Phosphatase: 65 U/L (ref 38–126)
Anion gap: 8 (ref 5–15)
BUN: 13 mg/dL (ref 8–23)
CO2: 21 mmol/L — ABNORMAL LOW (ref 22–32)
Calcium: 8.4 mg/dL — ABNORMAL LOW (ref 8.9–10.3)
Chloride: 110 mmol/L (ref 98–111)
Creatinine, Ser: 0.98 mg/dL (ref 0.61–1.24)
GFR calc Af Amer: >60 mL/min (ref >60)
GFR calc non Af Amer: >60 mL/min (ref >60)
Glucose, Bld: 210 mg/dL — ABNORMAL HIGH (ref 70–99)
Potassium: 3.9 mmol/L (ref 3.5–5.1)
Sodium: 139 mmol/L (ref 135–145)
Total Bilirubin: 1.5 mg/dL — ABNORMAL HIGH (ref 0.3–1.2)
Total Protein: 6.7 g/dL (ref 6.5–8.1)

## 2019-06-14 LAB — GLUCOSE, CAPILLARY
Glucose-Capillary: 119 mg/dL — ABNORMAL HIGH (ref 70–99)
Glucose-Capillary: 147 mg/dL — ABNORMAL HIGH (ref 70–99)
Glucose-Capillary: 166 mg/dL — ABNORMAL HIGH (ref 70–99)
Glucose-Capillary: 196 mg/dL — ABNORMAL HIGH (ref 70–99)
Glucose-Capillary: 213 mg/dL — ABNORMAL HIGH (ref 70–99)

## 2019-06-14 LAB — CBC
HCT: 41.5 % (ref 39.0–52.0)
HCT: 39.5 % (ref 39.0–52.0)
Hemoglobin: 14.5 g/dL (ref 13.0–17.0)
Hemoglobin: 13.7 g/dL (ref 13.0–17.0)
MCH: 33.3 pg (ref 26.0–34.0)
MCH: 33.3 pg (ref 26.0–34.0)
MCHC: 34.9 g/dL (ref 30.0–36.0)
MCHC: 34.7 g/dL (ref 30.0–36.0)
MCV: 95.4 fL (ref 80.0–100.0)
MCV: 95.9 fL (ref 80.0–100.0)
Platelets: 154 10*3/uL (ref 150–400)
Platelets: 149 10*3/uL — ABNORMAL LOW (ref 150–400)
RBC: 4.35 MIL/uL (ref 4.22–5.81)
RBC: 4.12 MIL/uL — ABNORMAL LOW (ref 4.22–5.81)
RDW: 13 % (ref 11.5–15.5)
RDW: 13.1 % (ref 11.5–15.5)
WBC: 15.1 10*3/uL — ABNORMAL HIGH (ref 4.0–10.5)
WBC: 15.2 10*3/uL — ABNORMAL HIGH (ref 4.0–10.5)
nRBC: 0 % (ref 0.0–0.2)
nRBC: 0 % (ref 0.0–0.2)

## 2019-06-14 LAB — HIV ANTIBODY (ROUTINE TESTING W REFLEX): HIV Screen 4th Generation wRfx: NONREACTIVE

## 2019-06-14 LAB — HEMOGLOBIN A1C
Hgb A1c MFr Bld: 8 % — ABNORMAL HIGH (ref 4.8–5.6)
Mean Plasma Glucose: 182.9 mg/dL

## 2019-06-14 SURGERY — COLONOSCOPY WITH PROPOFOL
Anesthesia: Monitor Anesthesia Care

## 2019-06-14 MED ORDER — PROPOFOL 500 MG/50ML IV EMUL
INTRAVENOUS | Status: AC
  Filled 2019-06-14: qty 50

## 2019-06-14 MED ORDER — LACTATED RINGERS IV SOLN
INTRAVENOUS | Status: DC
  Administered 2019-06-14: 1000 mL via INTRAVENOUS

## 2019-06-14 MED ORDER — PROPOFOL 10 MG/ML IV BOLUS
INTRAVENOUS | Status: DC | PRN
  Administered 2019-06-14: 20 mg via INTRAVENOUS
  Administered 2019-06-14: 40 mg via INTRAVENOUS
  Administered 2019-06-14: 30 mg via INTRAVENOUS
  Administered 2019-06-14 (×7): 20 mg via INTRAVENOUS

## 2019-06-14 MED ORDER — INSULIN ASPART 100 UNIT/ML ~~LOC~~ SOLN
0.0000 [IU] | Freq: Three times a day (TID) | SUBCUTANEOUS | Status: DC
  Administered 2019-06-14 – 2019-06-15 (×4): 2 [IU] via SUBCUTANEOUS
  Administered 2019-06-15 – 2019-06-16 (×2): 3 [IU] via SUBCUTANEOUS
  Administered 2019-06-16: 2 [IU] via SUBCUTANEOUS

## 2019-06-14 MED ORDER — SODIUM CHLORIDE 0.9 % IV SOLN: INTRAVENOUS | Status: DC

## 2019-06-14 MED ORDER — INSULIN GLARGINE 100 UNIT/ML ~~LOC~~ SOLN
15.0000 [IU] | Freq: Every day | SUBCUTANEOUS | Status: DC
  Administered 2019-06-14: 15 [IU] via SUBCUTANEOUS
  Filled 2019-06-14: qty 0.15

## 2019-06-14 MED ORDER — LIDOCAINE 2% (20 MG/ML) 5 ML SYRINGE
INTRAMUSCULAR | Status: DC | PRN
  Administered 2019-06-14: 80 mg via INTRAVENOUS

## 2019-06-14 MED ORDER — INSULIN ASPART 100 UNIT/ML ~~LOC~~ SOLN
0.0000 [IU] | Freq: Every day | SUBCUTANEOUS | Status: DC
  Administered 2019-06-15: 2 [IU] via SUBCUTANEOUS

## 2019-06-14 SURGICAL SUPPLY — 22 items

## 2019-06-14 NOTE — Anesthesia Postprocedure Evaluation (Signed)
Anesthesia Post Note  Patient: Travis [name]  Procedure(s) Performed: COLONOSCOPY WITH PROPOFOL (N/A ) BIOPSY     Patient location during evaluation: Endoscopy Anesthesia Type: MAC Level of consciousness: awake and alert Pain management: pain level controlled Vital Signs Assessment: post-procedure vital signs reviewed and stable Respiratory status: spontaneous breathing, nonlabored ventilation and respiratory function stable Cardiovascular status: blood pressure returned to baseline and stable Postop Assessment: no apparent nausea or vomiting Anesthetic complications: no    Last Vitals:  Vitals:   06/14/19 0810 06/14/19 0820  BP: 120/63 113/67  Pulse: 71 69  Resp: 18 14  Temp:    SpO2: 96% 94%    Last Pain:  Vitals:   06/14/19 0923  TempSrc:   PainSc: 0-No pain                 [name]

## 2019-06-14 NOTE — Interval H&P Note (Signed)
History and Physical Interval Note:  06/14/2019 7:29 AM  Travis [name]  has presented today for surgery, with the diagnosis of Diarrhea and rectal bleeding.  The various methods of treatment have been discussed with the patient and family. After consideration of risks, benefits and other options for treatment, the patient has consented to  Procedure(s): COLONOSCOPY WITH PROPOFOL (N/A) as a surgical intervention.  The patient's history has been reviewed, patient examined, no change in status, stable for surgery.  I have reviewed the patient's chart and labs.  Questions were answered to the patient's satisfaction.     [name]

## 2019-06-14 NOTE — Op Note (Addendum)
[facility] Patient Name: Travis [name] Procedure Date: 06/14/2019 MRN: [id number] Attending MD: Estill Cotta. Loletha Carrow , MD Date of Birth: 1960/11/08 CSN: [id number] Age: 59 Admit Type: Inpatient Procedure:                Colonoscopy Indications:              Lower abdominal pain, Hematochezia Providers:                Mallie Mussel L. Loletha Carrow, MD, Elmer Ramp. Tilden Dome, RN, Lina Sar, Technician, Marla Roe, CRNA Referring MD:             [facility] Medicines:                Monitored Anesthesia Care Complications:            No immediate complications. Estimated Blood Loss:     Estimated blood loss was minimal. Procedure:                Pre-Anesthesia Assessment:                           - Prior to the procedure, a History and Physical                            was performed, and patient medications and                            allergies were reviewed. The patient's tolerance of                            previous anesthesia was also reviewed. The risks                            and benefits of the procedure and the sedation                            options and risks were discussed with the patient.                            All questions were answered, and informed consent                            was obtained. Prior Anticoagulants: The patient has                            taken Eliquis (apixaban), last dose was 2 days                            prior to procedure. ASA Grade Assessment: III - A                            patient with severe systemic disease. After  reviewing the risks and benefits, the patient was                            deemed in satisfactory condition to undergo the                            procedure.                           After obtaining informed consent, the colonoscope                            was passed under direct vision. Throughout the                            procedure,  the patient's blood pressure, pulse, and                            oxygen saturations were monitored continuously. The                            CF-HQ190L ([id number]) Olympus colonoscope was                            introduced through the anus and most likely                            advanced to the the cecum, identified by what                            appeared to be the ileocecal valve. If not advanced                            to the cecum, then at least to ascending colon.                            Normal anatomic landmarks were obscured. The                            colonoscopy was performed without difficulty, but                            mucosal exam time purposely limited due to severity                            of findings (to minimize insufflation). The patient                            tolerated the procedure well. The quality of the                            bowel preparation was good. The ileocecal valve and  the rectum were photographed. Scope In: 7:43:41 AM Scope Out: 7:53:56 AM Scope Withdrawal Time: 0 hours 2 minutes 35 seconds  Total Procedure Duration: 0 hours 10 minutes 15 seconds  Findings:      The perianal and digital rectal examinations were normal.      Diffuse severe inflammation characterized by altered vascularity,       congestion (edema), loss of vascularity and deep ulcerations was found       from cecum to mid transverse colon (almost confluent from cecum through       ascending colon, then patchy through mid transverse). Necrotic mucosa       was seen. Biopsies were taken with a cold forceps for histology.      There was evidence of a prior end-to-side colo-colonic anastomosis in       the recto-sigmoid colon. This was patent and was characterized by a       small patch of non-specific erythema. The anastomosis was traversed. Impression:               - Diffuse severe inflammation was found from cecum                             to transverse colon secondary to ischemic colitis.                            Biopsied.                           - Patent end-to-side colo-colonic anastomosis,                            characterized by erythema.                           Looks like ischemic colitis, through trigger for                            this episode unclear. Occurred within hours after                            outpatient CTAP with oral and IV contrast. Moderate Sedation:      MAC sedation used Recommendation:           - Return patient to hospital ward for ongoing care.                           - Clear liquid diet.                           - Cipro (ciprofloxacin) 400 mg IV q 12 hr.                           - Flagyl (metronidazole) 500 mg IV loading dose                            followed by 500 mg IV q 8 hr.                           -  Await pathology results.                           - Hold oral anticoagulation until further notice.                            Recommendations on timing to resume it will be made                            at hospital discharge. Procedure Code(s):        --- Professional ---                           [phone], Colonoscopy, flexible; with biopsy, single                            or multiple Diagnosis Code(s):        --- Professional ---                           K55.9, Vascular disorder of intestine, unspecified                           Z98.0, Intestinal bypass and anastomosis status                           R10.30, Lower abdominal pain, unspecified                           K92.1, Melena (includes Hematochezia) CPT copyright 2019 American Medical Association. All rights reserved. The codes documented in this report are preliminary and upon coder review may  be revised to meet current compliance requirements. Dariana Garbett L. Loletha Carrow, MD 06/14/2019 8:19:57 AM This report has been signed electronically. Number of Addenda: 0

## 2019-06-14 NOTE — Transfer of Care (Signed)
Immediate Anesthesia Transfer of Care Note  Patient: RAMIRO PANGILINAN  Procedure(s) Performed: COLONOSCOPY WITH PROPOFOL (N/A ) BIOPSY  Patient Location: PACU  Anesthesia Type:MAC  Level of Consciousness: sedated  Airway & Oxygen Therapy: Patient Spontanous Breathing and Patient connected to face mask oxygen  Post-op Assessment: Report given to RN and Post -op Vital signs reviewed and stable  Post vital signs: Reviewed and stable  Last Vitals:  Vitals Value Taken Time  BP    Temp    Pulse 78 06/14/19 0758  Resp    SpO2 100 % 06/14/19 0758  Vitals shown include unvalidated device data.  Last Pain:  Vitals:   06/14/19 0704  TempSrc: Oral  PainSc: 0-No pain         Complications: No apparent anesthesia complications

## 2019-06-14 NOTE — Anesthesia Preprocedure Evaluation (Addendum)
Anesthesia Evaluation  Patient identified by MRN, date of birth, ID band Patient awake    Reviewed: Allergy & Precautions, NPO status , Patient's Chart, lab work & pertinent test results  History of Anesthesia Complications Negative for: history of anesthetic complications  Airway Mallampati: III  TM Distance: >3 FB Neck ROM: Full    Dental  (+) Teeth Intact   Pulmonary sleep apnea , former smoker,    Pulmonary exam normal        Cardiovascular hypertension, Normal cardiovascular exam+ dysrhythmias Atrial Fibrillation      Neuro/Psych PSYCHIATRIC DISORDERS Anxiety Depression CVA    GI/Hepatic negative GI ROS, Neg liver ROS,   Endo/Other  diabetes, Insulin Dependent, Oral Hypoglycemic Agents  Renal/GU negative Renal ROS  negative genitourinary   Musculoskeletal  (+) Arthritis ,   Abdominal   Peds  Hematology negative hematology ROS (+)   Anesthesia Other Findings   Reproductive/Obstetrics                           Anesthesia Physical Anesthesia Plan  ASA: III  Anesthesia Plan: MAC   Post-op Pain Management:    Induction: Intravenous  PONV Risk Score and Plan: Propofol infusion, TIVA and Treatment may vary due to age or medical condition  Airway Management Planned: Natural Airway, Nasal Cannula and Simple Face Mask  Additional Equipment: None  Intra-op Plan:   Post-operative Plan:   Informed Consent: I have reviewed the patients History and Physical, chart, labs and discussed the procedure including the risks, benefits and alternatives for the proposed anesthesia with the patient or authorized representative who has indicated his/her understanding and acceptance.       Plan Discussed with:   Anesthesia Plan Comments:        Anesthesia Quick Evaluation

## 2019-06-14 NOTE — Progress Notes (Signed)
[facility]                                                                              Patient Demographics  Travis [name], is a 59 y.o. male, DOB - 1960/10/16, ZPS:[id number]  Admit date - 06/13/2019   Admitting Physician Reubin Milan, MD  Outpatient Primary MD for the patient is Kathyrn Drown, MD  Outpatient specialists:   LOS - 0  days   Medical records reviewed and are as summarized below:    Chief Complaint  Patient presents with  . Rectal Bleeding       Brief summary   Patient is a 59 year old male with paroxysmal A. fib, anxiety/depression, osteoarthritis, diabetes mellitus type 2, neuropathy, diverticulosis, hyperlipidemia, hypertension, OSA on CPAP, history of CVA presented to ED with rectal bleeding associated with abdominal pain, flatulence.  Per patient pain had been so intense that he was unable to sleep, had 1 episode of emesis on the morning of admission, denied hematemesis. COVID-19 test negative CT abdomen pelvis showed borderline wall thickening in the rectum.  C. difficile negative.  Assessment & Plan    Principal Problem: Abdominal pain with rectal bleeding secondary to acute ischemic colitis -GI consulted.  Patient underwent colonoscopy today which showed diffuse severe inflammation from cecum to the transverse colon secondary to ischemic colitis.  Biopsies performed. -Placed on ciprofloxacin 400 mg every 12 hours, Flagyl 500 mg every 8 hours -Clear liquid diet, IV fluids, pain control -Hold anticoagulation   Active Problems: Diabetes mellitus type 2, with complications, neuropathy, IDDM -Change to sliding scale insulin, add Lantus, hold Metformin -Obtain hemoglobin A1c, added Lantus 15 units at bedtime (takes 40 units at bedtime, however now on clear liquid diet only    Essential hypertension -BP currently stable     Mixed hyperlipidemia -Continue statin    OSA (obstructive sleep apnea) -CPAP qhs     Paroxysmal  atrial fibrillation (HCC) -Rate controlled, hold anticoagulation due to #1  Code Status: Full code DVT Prophylaxis:   SCD's Family Communication: Discussed all imaging results, lab results, explained to the patient's wife at the bedside   Disposition Plan: Patient from home, anticipated discharge home once tolerating solid diet, cleared from GI   Time Spent in minutes   35 minutes  Procedures:  Colonoscopy  Consultants:   Gastroenterology  Antimicrobials:   Anti-infectives (From admission, onward)   Start     Dose/Rate Route Frequency Ordered Stop   06/13/19 2200  valACYclovir (VALTREX) tablet 1,000 mg     1,000 mg Oral Daily 06/13/19 1846     06/13/19 1400  ciprofloxacin (CIPRO) IVPB 400 mg     400 mg 200 mL/hr over 60 Minutes Intravenous Every 12 hours 06/13/19 1317     06/13/19 1400  metroNIDAZOLE (FLAGYL) IVPB 500 mg     500 mg 100 mL/hr over 60 Minutes Intravenous Every 8 hours 06/13/19 1317            Medications  Scheduled Meds: . atorvastatin  20 mg Oral Daily  . fluticasone  1 spray Each Nare Daily  . hyoscyamine  0.125 mg Sublingual QAC breakfast  .  insulin aspart  0-9 Units Subcutaneous Q6H  . pantoprazole (PROTONIX) IV  40 mg Intravenous Q24H  . peg 3350 powder  0.5 kit Oral Once  . pregabalin  100 mg Oral TID  . valACYclovir  1,000 mg Oral Daily   Continuous Infusions: . 0.9 % NaCl with KCl 20 mEq / L 100 mL/hr at 06/14/19 0954  . ciprofloxacin 400 mg (06/14/19 0147)  . metronidazole 500 mg (06/14/19 0603)   PRN Meds:.acetaminophen **OR** acetaminophen, ALPRAZolam, meclizine, morphine injection, ondansetron **OR** ondansetron (ZOFRAN) IV      Subjective:   Travis [name] was seen and examined today.  Just returned from colonoscopy, feels miserable, has diffuse abdominal pain.  No fevers or chills.  Patient denies dizziness, chest pain, shortness of breath,  new weakness, numbess, tingling. No acute events overnight.    Objective:    Vitals:   06/14/19 0704 06/14/19 0800 06/14/19 0810 06/14/19 0820  BP: (!) 153/96 (!) 109/57 120/63 113/67  Pulse: 77 80 71 69  Resp: '12 14 18 14  ' Temp: 98.2 F (36.8 C) 98.3 F (36.8 C)    TempSrc: Oral Axillary    SpO2: 94% 100% 96% 94%  Weight:      Height:        Intake/Output Summary (Last 24 hours) at 06/14/2019 1039 Last data filed at 06/14/2019 0754 Gross per 24 hour  Intake 2321.54 ml  Output --  Net 2321.54 ml     Wt Readings from Last 3 Encounters:  06/13/19 86.2 kg  06/04/19 86.2 kg  05/16/19 88 kg     Exam  General: Somewhat sleepy but arousable and oriented, NAD  Cardiovascular: S1 S2 auscultated, no murmurs, RRR  Respiratory: Clear to auscultation bilaterally, no wheezing, rales or rhonchi  Gastrointestinal: Soft, mild diffuse TTP, nondistended, + bowel sounds  Ext: no pedal edema bilaterally  Neuro: No new deficits  Musculoskeletal: No digital cyanosis, clubbing  Skin: No rashes  Psych: Somewhat sleepy   Data Reviewed:  I have personally reviewed following labs and imaging studies  Micro Results Recent Results (from the past 240 hour(s))  C difficile quick scan w PCR reflex     Status: None   Collection Time: 06/13/19 12:28 PM   Specimen: STOOL  Result Value Ref Range Status   C Diff antigen NEGATIVE NEGATIVE Final   C Diff toxin NEGATIVE NEGATIVE Final   C Diff interpretation No C. difficile detected.  Final    Comment: Performed at [facility], [place] [address] [place], [place] [address]  Respiratory Panel by RT PCR (Flu A&B, Covid) - STOOL     Status: None   Collection Time: 06/13/19 12:32 PM   Specimen: STOOL  Result Value Ref Range Status   SARS Coronavirus 2 by RT PCR NEGATIVE NEGATIVE Final    Comment: (NOTE) SARS-CoV-2 target nucleic acids are NOT DETECTED. The SARS-CoV-2 RNA is generally detectable in upper respiratoy specimens during the acute phase of infection. The lowest concentration of  SARS-CoV-2 viral copies this assay can detect is 131 copies/mL. A negative result does not preclude SARS-Cov-2 infection and should not be used as the sole basis for treatment or other patient management decisions. A negative result may occur with  improper specimen collection/handling, submission of specimen other than nasopharyngeal swab, presence of viral mutation(s) within the areas targeted by this assay, and inadequate number of viral copies (<131 copies/mL). A negative result must be combined with clinical observations, patient history, and epidemiological information. The expected result  is Negative. Fact Sheet for Patients:  PinkCheek.be Fact Sheet for Healthcare Providers:  GravelBags.it This test is not yet ap proved or cleared by the [place] FDA and  has been authorized for detection and/or diagnosis of SARS-CoV-2 by FDA under an Emergency Use Authorization (EUA). This EUA will remain  in effect (meaning this test can be used) for the duration of the COVID-19 declaration under Section 564(b)(1) of the Act, 21 U.S.C. section 360bbb-3(b)(1), unless the authorization is terminated or revoked sooner.    Influenza A by PCR NEGATIVE NEGATIVE Final   Influenza B by PCR NEGATIVE NEGATIVE Final    Comment: (NOTE) The Xpert Xpress SARS-CoV-2/FLU/RSV assay is intended as an aid in  the diagnosis of influenza from Nasopharyngeal swab specimens and  should not be used as a sole basis for treatment. Nasal washings and  aspirates are unacceptable for Xpert Xpress SARS-CoV-2/FLU/RSV  testing. Fact Sheet for Patients: PinkCheek.be Fact Sheet for Healthcare Providers: GravelBags.it This test is not yet approved or cleared by the [place] FDA and  has been authorized for detection and/or diagnosis of SARS-CoV-2 by  FDA under an Emergency Use Authorization (EUA).  This EUA will remain  in effect (meaning this test can be used) for the duration of the  Covid-19 declaration under Section 564(b)(1) of the Act, 21  U.S.C. section 360bbb-3(b)(1), unless the authorization is  terminated or revoked. Performed at [facility], [place] [address], [place], [place] [address]     Radiology Reports CT Abdomen Pelvis W Contrast  Result Date: 06/13/2019 CLINICAL DATA:  Altered bowel habits with loose stools and fecal urgency. Low back pain. EXAM: CT ABDOMEN AND PELVIS WITH CONTRAST TECHNIQUE: Multidetector CT imaging of the abdomen and pelvis was performed using the standard protocol following bolus administration of intravenous contrast. CONTRAST:  18m OMNIPAQUE IOHEXOL 300 MG/ML  SOLN COMPARISON:  Report from 10/01/2002 FINDINGS: Lower chest: Unremarkable Hepatobiliary: Unremarkable Pancreas: Unremarkable Spleen: Unremarkable Adrenals/Urinary Tract: Three nonobstructive right renal calculi are present, the largest 0.4 cm in the lower pole on image 33/4. A 0.1 cm left kidney lower pole nonobstructive renal calculus is present. No hydronephrosis or hydroureter. Mild scarring in the right kidney lower pole. Fluid density 1.1 cm exophytic lesion from the left mid kidney posterolaterally favors a cyst. Urinary bladder unremarkable. A 2.1 by 1.3 cm mass of the lateral limb of the left adrenal gland has a portal venous phase density of 58 Hounsfield units and a delayed phase density of 31 Hounsfield units, yielding a relative washout of 47% consistent with adenoma. Stomach/Bowel: Postoperative findings in the sigmoid colon. Orally administered contrast extends through to the distal colon. Borderline wall thickening in the rectum although this may be due to nondistention. No dilated small bowel. Normal appendix. Vascular/Lymphatic: Mild aortoiliac atherosclerotic vascular disease. Reproductive: Mild prostatomegaly. Other: No supplemental non-categorized findings.  Musculoskeletal: Small umbilical hernia contains adipose tissue. Lumbar spondylosis and degenerative disc disease with degenerative retrolisthesis at L3-4 and L4-5. These factors result and bilateral foraminal impingement at L3-4, L4-5, and L5-S1. Prominent loss of disc height at each of these 3 levels as well. IMPRESSION: 1. Borderline wall thickening in the rectum may be due to nondistention. Postoperative findings in the sigmoid colon without complicating feature. 2. Bilateral nonobstructive nephrolithiasis. 3. Mild prostatomegaly. 4. Lumbar spondylosis and degenerative disc disease causing bilateral foraminal impingement at L3-4, L4-5, and L5-S1. 5. Small left adrenal adenoma. 6. Small umbilical hernia contains adipose tissue. Aortic Atherosclerosis (ICD10-I70.0). Electronically Signed   By:  Van Clines M.D.   On: 06/13/2019 09:44    Lab Data:  CBC: Recent Labs  Lab 06/13/19 1020 06/13/19 1237 06/13/19 1832 06/13/19 2051 06/14/19 0525  WBC 15.1* 14.0*  --  14.3* 15.1*  HGB 15.2 14.5 15.0 14.6 14.5  HCT 43.0 41.0  --  43.0 41.5  MCV 94.3 93.6  --  96.0 95.4  PLT 183 169  --  176 315   Basic Metabolic Panel: Recent Labs  Lab 06/13/19 1020 06/14/19 0525  NA 141 139  K 3.6 3.9  CL 106 110  CO2 26 21*  GLUCOSE 190* 210*  BUN 19 13  CREATININE 1.01 0.98  CALCIUM 8.9 8.4*  MG 2.0  --   PHOS 3.2  --    GFR: Estimated Creatinine Clearance: 89.5 mL/min (by C-G formula based on SCr of 0.98 mg/dL). Liver Function Tests: Recent Labs  Lab 06/13/19 1020 06/14/19 0525  AST 16 15  ALT 11 11  ALKPHOS 63 65  BILITOT 0.8 1.5*  PROT 7.5 6.7  ALBUMIN 4.5 3.8   Recent Labs  Lab 06/13/19 1020  LIPASE 32   No results for input(s): AMMONIA in the last 168 hours. Coagulation Profile: No results for input(s): INR, PROTIME in the last 168 hours. Cardiac Enzymes: No results for input(s): CKTOTAL, CKMB, CKMBINDEX, TROPONINI in the last 168 hours. BNP (last 3 results) No  results for input(s): PROBNP in the last 8760 hours. HbA1C: No results for input(s): HGBA1C in the last 72 hours. CBG: Recent Labs  Lab 06/13/19 1759 06/14/19 0006 06/14/19 0542  GLUCAP 123* 147* 213*   Lipid Profile: No results for input(s): CHOL, HDL, LDLCALC, TRIG, CHOLHDL, LDLDIRECT in the last 72 hours. Thyroid Function Tests: No results for input(s): TSH, T4TOTAL, FREET4, T3FREE, THYROIDAB in the last 72 hours. Anemia Panel: No results for input(s): VITAMINB12, FOLATE, FERRITIN, TIBC, IRON, RETICCTPCT in the last 72 hours. Urine analysis:    Component Value Date/Time   COLORURINE YELLOW 06/13/2019 1052   APPEARANCEUR CLEAR 06/13/2019 1052   LABSPEC 1.025 06/13/2019 1052   PHURINE 5.0 06/13/2019 1052   GLUCOSEU NEGATIVE 06/13/2019 1052   HGBUR SMALL (A) 06/13/2019 [place] 06/13/2019 [place] 06/13/2019 1052   PROTEINUR NEGATIVE 06/13/2019 1052   UROBILINOGEN 0.2 02/28/2015 1750   NITRITE NEGATIVE 06/13/2019 [place] 06/13/2019 1052     Makhi Muzquiz M.D. [facility] 06/14/2019, 10:39 AM   Call night coverage person covering after 7pm

## 2019-06-15 DIAGNOSIS — R1084 Generalized abdominal pain: Secondary | ICD-10-CM

## 2019-06-15 DIAGNOSIS — K559 Vascular disorder of intestine, unspecified: Secondary | ICD-10-CM

## 2019-06-15 DIAGNOSIS — K625 Hemorrhage of anus and rectum: Secondary | ICD-10-CM

## 2019-06-15 DIAGNOSIS — Z7901 Long term (current) use of anticoagulants: Secondary | ICD-10-CM

## 2019-06-15 LAB — BASIC METABOLIC PANEL
Anion gap: 7 (ref 5–15)
BUN: 9 mg/dL (ref 8–23)
CO2: 22 mmol/L (ref 22–32)
Calcium: 8.2 mg/dL — ABNORMAL LOW (ref 8.9–10.3)
Chloride: 107 mmol/L (ref 98–111)
Creatinine, Ser: 0.93 mg/dL (ref 0.61–1.24)
GFR calc Af Amer: >60 mL/min (ref >60)
GFR calc non Af Amer: >60 mL/min (ref >60)
Glucose, Bld: 200 mg/dL — ABNORMAL HIGH (ref 70–99)
Potassium: 3.9 mmol/L (ref 3.5–5.1)
Sodium: 136 mmol/L (ref 135–145)

## 2019-06-15 LAB — CBC
HCT: 41.5 % (ref 39.0–52.0)
Hemoglobin: 14.4 g/dL (ref 13.0–17.0)
MCH: 33.2 pg (ref 26.0–34.0)
MCHC: 34.7 g/dL (ref 30.0–36.0)
MCV: 95.6 fL (ref 80.0–100.0)
Platelets: 178 10*3/uL (ref 150–400)
RBC: 4.34 MIL/uL (ref 4.22–5.81)
RDW: 13.1 % (ref 11.5–15.5)
WBC: 15.7 10*3/uL — ABNORMAL HIGH (ref 4.0–10.5)
nRBC: 0 % (ref 0.0–0.2)

## 2019-06-15 LAB — GLUCOSE, CAPILLARY
Glucose-Capillary: 154 mg/dL — ABNORMAL HIGH (ref 70–99)
Glucose-Capillary: 224 mg/dL — ABNORMAL HIGH (ref 70–99)
Glucose-Capillary: 170 mg/dL — ABNORMAL HIGH (ref 70–99)
Glucose-Capillary: 244 mg/dL — ABNORMAL HIGH (ref 70–99)

## 2019-06-15 LAB — SURGICAL PATHOLOGY

## 2019-06-15 MED ORDER — INSULIN GLARGINE 100 UNIT/ML ~~LOC~~ SOLN
25.0000 [IU] | Freq: Every day | SUBCUTANEOUS | Status: DC
  Administered 2019-06-15: 25 [IU] via SUBCUTANEOUS
  Filled 2019-06-15 (×2): qty 0.25

## 2019-06-15 MED ORDER — INSULIN ASPART 100 UNIT/ML ~~LOC~~ SOLN
3.0000 [IU] | Freq: Three times a day (TID) | SUBCUTANEOUS | Status: DC
  Administered 2019-06-15 – 2019-06-16 (×5): 3 [IU] via SUBCUTANEOUS

## 2019-06-15 NOTE — Progress Notes (Signed)
Inpatient Diabetes Program Recommendations  AACE/ADA: New Consensus Statement on Inpatient Glycemic Control (2015)  Target Ranges:  Prepandial:   less than 140 mg/dL      Peak postprandial:   less than 180 mg/dL (1-2 hours)      Critically ill patients:  140 - 180 mg/dL   Lab Results  Component Value Date   GLUCAP 224 (H) 06/15/2019   HGBA1C 8.0 (H) 06/14/2019    Review of Glycemic Control  Diabetes history: DM2 Outpatient Diabetes medications: Lantus 40 units QHS, metformin 500 mg bid Current orders for Inpatient glycemic control: Lantus 25 units QHS, Novolog 0-9 units tidwc and 0-5 units QHS  Inpatient Diabetes Program Recommendations:     Add Novolog 3 units tidwc for meal coverage insulin if pt eats > 50% meal. Add CHO mod med to heart-healthy diet.  Will continue to follow.  Thank you. Lorenda Peck, RD, LDN, CDE Inpatient Diabetes Coordinator [phone]

## 2019-06-15 NOTE — Progress Notes (Addendum)
[facility] Progress Note  CC:  Rectal bleeding  Subjective:  Patient feeling better.  Had a couple of episodes of rectal bleeding overnight, but sound like much less.  Colonoscopy 2/11 as follows:  - Diffuse severe inflammation was found from cecum to transverse colon secondary to ischemic colitis. Biopsied. - Patent end-to-side colo-colonic anastomosis, characterized by erythema. Looks like ischemic colitis, through trigger for this episode unclear. Occurred within hours after outpatient CTAP with oral and IV contrast.  **Biopsies show active colitis with findings c/w ischemic colitis.  Hgb stable/normal.  WBC count still elevated at 15.7.  On cipro and Flagyl IV.  Tolerated some grits and coffee for breakfast.   Objective:  Vital signs in last 24 hours: Temp:  [98 F (36.7 C)-98.6 F (37 C)] 98.6 F (37 C) (02/12 0618) Pulse Rate:  [78-83] 78 (02/12 0618) Resp:  [16-18] 16 (02/12 0618) BP: (104-123)/(64-80) 113/64 (02/12 0618) SpO2:  [96 %-98 %] 97 % (02/12 0618) Last BM Date: 06/14/19 General:  Alert, Well-developed, in NAD Heart:  Regular rate and rhythm; no murmurs Pulm:  CTAB. No increased WOB. Abdomen:  Soft, non-distended.  BS present.  RLQ TTP. Extremities:  Without edema. Neurologic:  Alert and oriented x 4;  grossly normal neurologically. Psych:  Alert and cooperative. Normal mood and affect.  Intake/Output from previous day: 02/11 0701 - 02/12 0700 In: 960 [P.O.:360; I.V.:600] Out: -   Lab Results: Recent Labs    06/14/19 0525 06/14/19 1238 06/15/19 0922  WBC 15.1* 15.2* 15.7*  HGB 14.5 13.7 14.4  HCT 41.5 39.5 41.5  PLT 154 149* 178   BMET Recent Labs    06/13/19 1020 06/14/19 0525 06/15/19 0445  NA 141 139 136  K 3.6 3.9 3.9  CL 106 110 107  CO2 26 21* 22  GLUCOSE 190* 210* 200*  BUN 19 13 9   CREATININE 1.01 0.98 0.93  CALCIUM 8.9 8.4* 8.2*   LFT Recent Labs    06/14/19 0525  PROT 6.7  ALBUMIN 3.8  AST 15    ALT 11  ALKPHOS 65  BILITOT 1.5*   Assessment / Plan: *59 yo male currently under evaluation for bowel changes (urgency and increased frequency) and low back pain. CT scan as outpatient>>> borderline wall thickening of rectum vrs underdistention. Then presented to the ED with leukocytosis and several episodes of rectal bleeding (on Eliquis) with associated diffuse abdominal discomfort   Symptoms started AFTER CT scan.  Colonoscopy 2/11 showed changes suspicious for ischemic colitis in the right colon and biopsies returned confirming this as well.  Clinically improving. *History of DVT/Afib/TIA requiring Eliquis:  Currently on hold.  **Continue IV antibiotics while he is here, but no need for further antibiotics upon discharge. **Continue to hold Eliquis for now. **Will get patient follow-up appt in a few weeks (and put in discharge info), but expect that he will be here at least through tomorrow. **Ok to advance to soft/low residue diet.    LOS: 1 day   Laban Emperor. Zehr  06/15/2019, 11:33 AM  GI ATTENDING  History, laboratories, x-rays, pathology, endoscopy report reviewed.  Patient personally seen and examined.  Case discussed with Dr. Loletha Carrow.  Agree with interval comprehensive progress note as outlined.  Patient's wife is at the bedside.  Impression: 1.  Acute ischemic colitis (endoscopy and biopsies quite consistent).  Right-sided disease, which raises unique concerns or questions typically not considered with classic segmental disease in the watershed region.  Such considerations include acute  embolic disease or superior mesenteric vascular disease or insult.  He has a history of atrial fibrillation but currently in sinus rhythm.  On chronic anticoagulation.  No suspicion for embolic disease.  CT does show atherosclerotic disease.  Need to review for any evidence of mesenteric arterial stenosis.  Clinically the patient is stable and slowly improving.  Hemoglobin stable.  Still with  leukocytosis and moderate abdominal tenderness on exam.  Has not moved bowels today.  Yet to start regular diet.  He did have some mild discomfort with coffee and grits. 2.  History of complicated diverticular disease status post segmental sigmoid resection 3.  Multiple medical problems including DVT and atrial fibrillation with TIA-on chronic Eliquis.  Recommendations: 1.  Continue antibiotics through hospitalization.  He does not need outpatient antibiotics. 2.  Advancing diet.  Continue to monitor abdominal exam and leukocytosis.  If he is tolerating diet well and having only minimal to no pain and no fevers, then he will be ready for discharge.  This is not today.  I discussed this with the patient and his wife.  They understand. 3.  Resume anticoagulation therapy at discharge 4.  We will arrange for patient to see me in follow-up in the GI clinic in about 4 weeks. GI will follow until the patient's discharge.  Docia Chuck. Geri Seminole., M.D. [facility] Division of Gastroenterology

## 2019-06-15 NOTE — Progress Notes (Signed)
[facility]                                                                              Patient Demographics  Travis [name], is a 59 y.o. male, DOB - [birth year], QMG:[id number]  Admit date - 06/13/2019   Admitting Physician Reubin Milan, MD  Outpatient Primary MD for the patient is Kathyrn Drown, MD  Outpatient specialists:   LOS - 1  days   Medical records reviewed and are as summarized below:    Chief Complaint  Patient presents with   Rectal Bleeding       Brief summary   Patient is a 59 year old male with paroxysmal A. fib, anxiety/depression, osteoarthritis, diabetes mellitus type 2, neuropathy, diverticulosis, hyperlipidemia, hypertension, OSA on CPAP, history of CVA presented to ED with rectal bleeding associated with abdominal pain, flatulence.  Per patient pain had been so intense that he was unable to sleep, had 1 episode of emesis on the morning of admission, denied hematemesis. COVID-19 test negative CT abdomen pelvis showed borderline wall thickening in the rectum.  C. difficile negative.  Assessment & Plan    Principal Problem: Abdominal pain with rectal bleeding secondary to acute ischemic colitis -GI consulted.  Patient underwent colonoscopy today which showed diffuse severe inflammation from cecum to the transverse colon secondary to ischemic colitis.  Biopsies performed. -Continue IV ciprofloxacin and Flagyl.  Still mildly tender, leukocytosis -Placed on full liquids in the morning, advance to soft solid if tolerated -No rectal bleeding this morning per patient -GI recommendations, will hold Eliquis for 1 week   Active Problems: Diabetes mellitus type 2, with complications, neuropathy, IDDM, uncontrolled with hyperglycemia -Increase Lantus to 25 units at bedtime, NovoLog meal coverage 3 units 3 times daily AC -Continue sliding scale insulin -IMA globin A1c 8.0    Essential hypertension -BP currently stable    Mixed  hyperlipidemia -Continue statin    OSA (obstructive sleep apnea) -CPAP qhs     Paroxysmal atrial fibrillation (HCC) -Rate controlled, continue to hold Eliquis  Code Status: Full code DVT Prophylaxis:   SCD's Family Communication: Discussed all imaging results, lab results, explained to the patient's wife at the bedside   Disposition Plan: Patient from home, anticipated discharge home once tolerating solid diet, cleared from GI.  Anticipate discharge home in a.m.   Time Spent in minutes   35 minutes  Procedures:  Colonoscopy  Consultants:   Gastroenterology  Antimicrobials:   Anti-infectives (From admission, onward)   Start     Dose/Rate Route Frequency Ordered Stop   06/13/19 2200  valACYclovir (VALTREX) tablet 1,000 mg     1,000 mg Oral Daily 06/13/19 1846     06/13/19 1400  ciprofloxacin (CIPRO) IVPB 400 mg     400 mg 200 mL/hr over 60 Minutes Intravenous Every 12 hours 06/13/19 1317     06/13/19 1400  metroNIDAZOLE (FLAGYL) IVPB 500 mg     500 mg 100 mL/hr over 60 Minutes Intravenous Every 8 hours 06/13/19 1317           Medications  Scheduled Meds:  atorvastatin  20 mg Oral Daily   fluticasone  1 spray Each Nare Daily   insulin aspart  0-5 Units Subcutaneous QHS   insulin aspart  0-9 Units Subcutaneous TID WC   insulin aspart  3 Units Subcutaneous TID WC   insulin glargine  25 Units Subcutaneous QHS   pantoprazole (PROTONIX) IV  40 mg Intravenous Q24H   peg 3350 powder  0.5 kit Oral Once   pregabalin  100 mg Oral TID   valACYclovir  1,000 mg Oral Daily   Continuous Infusions:  0.9 % NaCl with KCl 20 mEq / L 100 mL/hr at 06/14/19 1838   ciprofloxacin 400 mg (06/15/19 1330)   metronidazole 500 mg (06/15/19 0627)   PRN Meds:.acetaminophen **OR** acetaminophen, ALPRAZolam, meclizine, morphine injection, ondansetron **OR** ondansetron (ZOFRAN) IV      Subjective:   [place] Code was seen and examined today.  Feeling a lot better  today, no fevers or chills.  Abdominal pain improving.  No nausea or vomiting.  No rectal bleeding this morning.   Objective:   Vitals:   06/14/19 1409 06/14/19 2107 06/15/19 0618 06/15/19 1353  BP: 104/80 123/73 113/64 (!) 144/78  Pulse: 83 79 78 82  Resp: _0 Temp: 98 F (36.7 C) 98.4 F (36.9 C) 98.6 F (37 C) 99.1 F (37.3 C)  TempSrc: Oral Oral  Oral  SpO2: 96% 98% 97% 98%  Weight:      Height:        Intake/Output Summary (Last 24 hours) at 06/15/2019 1432 Last data filed at 06/14/2019 1800 Gross per 24 hour  Intake 120 ml  Output --  Net 120 ml     Wt Readings from Last 3 Encounters:  06/13/19 86.2 kg  06/04/19 86.2 kg  05/16/19 88 kg    Physical Exam  General: Alert and oriented x 3, NAD  Cardiovascular: S1 S2 clear, RRR. No pedal edema b/l  Respiratory: CTAB, no wheezing, rales or rhonchi  Gastrointestinal: Soft, nontender, nondistended, NBS  Ext: no pedal edema bilaterally  Neuro: no new deficits  Musculoskeletal: No cyanosis, clubbing  Skin: No rashes  Psych: Normal affect and demeanor, alert and oriented x3    Data Reviewed:  I have personally reviewed following labs and imaging studies  Micro Results Recent Results (from the past 240 hour(s))  C difficile quick scan w PCR reflex     Status: None   Collection Time: 06/13/19 12:28 PM   Specimen: STOOL  Result Value Ref Range Status   C Diff antigen NEGATIVE NEGATIVE Final   C Diff toxin NEGATIVE NEGATIVE Final   C Diff interpretation No C. difficile detected.  Final    Comment: Performed at [facility], [place] [address] [place], [place] [address]  Respiratory Panel by RT PCR (Flu A&B, Covid) - STOOL     Status: None   Collection Time: 06/13/19 12:32 PM   Specimen: STOOL  Result Value Ref Range Status   SARS Coronavirus 2 by RT PCR NEGATIVE NEGATIVE Final    Comment: (NOTE) SARS-CoV-2 target nucleic acids are NOT DETECTED. The SARS-CoV-2 RNA is generally  detectable in upper respiratoy specimens during the acute phase of infection. The lowest concentration of SARS-CoV-2 viral copies this assay can detect is 131 copies/mL. A negative result does not preclude SARS-Cov-2 infection and should not be used as the sole basis for treatment or other patient management decisions. A negative result may occur with  improper specimen collection/handling, submission of specimen other than nasopharyngeal swab, presence of viral mutation(s) within  the areas targeted by this assay, and inadequate number of viral copies (<131 copies/mL). A negative result must be combined with clinical observations, patient history, and epidemiological information. The expected result is Negative. Fact Sheet for Patients:  PinkCheek.be Fact Sheet for Healthcare Providers:  GravelBags.it This test is not yet ap proved or cleared by the [place] FDA and  has been authorized for detection and/or diagnosis of SARS-CoV-2 by FDA under an Emergency Use Authorization (EUA). This EUA will remain  in effect (meaning this test can be used) for the duration of the COVID-19 declaration under Section 564(b)(1) of the Act, 21 U.S.C. section 360bbb-3(b)(1), unless the authorization is terminated or revoked sooner.    Influenza A by PCR NEGATIVE NEGATIVE Final   Influenza B by PCR NEGATIVE NEGATIVE Final    Comment: (NOTE) The Xpert Xpress SARS-CoV-2/FLU/RSV assay is intended as an aid in  the diagnosis of influenza from Nasopharyngeal swab specimens and  should not be used as a sole basis for treatment. Nasal washings and  aspirates are unacceptable for Xpert Xpress SARS-CoV-2/FLU/RSV  testing. Fact Sheet for Patients: PinkCheek.be Fact Sheet for Healthcare Providers: GravelBags.it This test is not yet approved or cleared by the [place] FDA and  has been  authorized for detection and/or diagnosis of SARS-CoV-2 by  FDA under an Emergency Use Authorization (EUA). This EUA will remain  in effect (meaning this test can be used) for the duration of the  Covid-19 declaration under Section 564(b)(1) of the Act, 21  U.S.C. section 360bbb-3(b)(1), unless the authorization is  terminated or revoked. Performed at [facility], [place] [address] [place], [place] [address]     Radiology Reports CT Abdomen Pelvis W Contrast  Result Date: 06/13/2019 CLINICAL DATA:  Altered bowel habits with loose stools and fecal urgency. Low back pain. EXAM: CT ABDOMEN AND PELVIS WITH CONTRAST TECHNIQUE: Multidetector CT imaging of the abdomen and pelvis was performed using the standard protocol following bolus administration of intravenous contrast. CONTRAST:  181m OMNIPAQUE IOHEXOL 300 MG/ML  SOLN COMPARISON:  Report from 10/01/2002 FINDINGS: Lower chest: Unremarkable Hepatobiliary: Unremarkable Pancreas: Unremarkable Spleen: Unremarkable Adrenals/Urinary Tract: Three nonobstructive right renal calculi are present, the largest 0.4 cm in the lower pole on image 33/4. A 0.1 cm left kidney lower pole nonobstructive renal calculus is present. No hydronephrosis or hydroureter. Mild scarring in the right kidney lower pole. Fluid density 1.1 cm exophytic lesion from the left mid kidney posterolaterally favors a cyst. Urinary bladder unremarkable. A 2.1 by 1.3 cm mass of the lateral limb of the left adrenal gland has a portal venous phase density of 58 Hounsfield units and a delayed phase density of 31 Hounsfield units, yielding a relative washout of 47% consistent with adenoma. Stomach/Bowel: Postoperative findings in the sigmoid colon. Orally administered contrast extends through to the distal colon. Borderline wall thickening in the rectum although this may be due to nondistention. No dilated small bowel. Normal appendix. Vascular/Lymphatic: Mild aortoiliac  atherosclerotic vascular disease. Reproductive: Mild prostatomegaly. Other: No supplemental non-categorized findings. Musculoskeletal: Small umbilical hernia contains adipose tissue. Lumbar spondylosis and degenerative disc disease with degenerative retrolisthesis at L3-4 and L4-5. These factors result and bilateral foraminal impingement at L3-4, L4-5, and L5-S1. Prominent loss of disc height at each of these 3 levels as well. IMPRESSION: 1. Borderline wall thickening in the rectum may be due to nondistention. Postoperative findings in the sigmoid colon without complicating feature. 2. Bilateral nonobstructive nephrolithiasis. 3. Mild prostatomegaly. 4. Lumbar spondylosis and degenerative  disc disease causing bilateral foraminal impingement at L3-4, L4-5, and L5-S1. 5. Small left adrenal adenoma. 6. Small umbilical hernia contains adipose tissue. Aortic Atherosclerosis (ICD10-I70.0). Electronically Signed   By: Van Clines M.D.   On: 06/13/2019 09:44    Lab Data:  CBC: Recent Labs  Lab 06/13/19 1237 06/13/19 1237 06/13/19 1832 06/13/19 2051 06/14/19 0525 06/14/19 1238 06/15/19 0922  WBC 14.0*  --   --  14.3* 15.1* 15.2* 15.7*  HGB 14.5   < > 15.0 14.6 14.5 13.7 14.4  HCT 41.0  --   --  43.0 41.5 39.5 41.5  MCV 93.6  --   --  96.0 95.4 95.9 95.6  PLT 169  --   --  176 154 149* 178   < > = values in this interval not displayed.   Basic Metabolic Panel: Recent Labs  Lab 06/13/19 1020 06/14/19 0525 06/15/19 0445  NA 141 139 136  K 3.6 3.9 3.9  CL 106 110 107  CO2 26 21* 22  GLUCOSE 190* 210* 200*  BUN _0 CREATININE 1.01 0.98 0.93  CALCIUM 8.9 8.4* 8.2*  MG 2.0  --   --   PHOS 3.2  --   --    GFR: Estimated Creatinine Clearance: 94.3 mL/min (by C-G formula based on SCr of 0.93 mg/dL). Liver Function Tests: Recent Labs  Lab 06/13/19 1020 06/14/19 0525  AST 16 15  ALT 11 11  ALKPHOS 63 65  BILITOT 0.8 1.5*  PROT 7.5 6.7  ALBUMIN 4.5 3.8   Recent Labs  Lab  06/13/19 1020  LIPASE 32   No results for input(s): AMMONIA in the last 168 hours. Coagulation Profile: No results for input(s): INR, PROTIME in the last 168 hours. Cardiac Enzymes: No results for input(s): CKTOTAL, CKMB, CKMBINDEX, TROPONINI in the last 168 hours. BNP (last 3 results) No results for input(s): PROBNP in the last 8760 hours. HbA1C: Recent Labs    06/14/19 1238  HGBA1C 8.0*   CBG: Recent Labs  Lab 06/14/19 1153 06/14/19 1656 06/14/19 2101 06/15/19 0723 06/15/19 1129  GLUCAP 166* 196* 119* 154* 224*   Lipid Profile: No results for input(s): CHOL, HDL, LDLCALC, TRIG, CHOLHDL, LDLDIRECT in the last 72 hours. Thyroid Function Tests: No results for input(s): TSH, T4TOTAL, FREET4, T3FREE, THYROIDAB in the last 72 hours. Anemia Panel: No results for input(s): VITAMINB12, FOLATE, FERRITIN, TIBC, IRON, RETICCTPCT in the last 72 hours. Urine analysis:    Component Value Date/Time   COLORURINE YELLOW 06/13/2019 1052   APPEARANCEUR CLEAR 06/13/2019 1052   LABSPEC 1.025 06/13/2019 1052   PHURINE 5.0 06/13/2019 1052   GLUCOSEU NEGATIVE 06/13/2019 1052   HGBUR SMALL (A) 06/13/2019 [place] 06/13/2019 [place] 06/13/2019 1052   PROTEINUR NEGATIVE 06/13/2019 1052   UROBILINOGEN 0.2 02/28/2015 1750   NITRITE NEGATIVE 06/13/2019 [place] 06/13/2019 1052       M.D. [facility] 06/15/2019, 2:32 PM   Call night coverage person covering after 7pm

## 2019-06-16 ENCOUNTER — Telehealth: Payer: Self-pay | Admitting: Family Medicine

## 2019-06-16 LAB — CBC
HCT: 35.2 % — ABNORMAL LOW (ref 39.0–52.0)
Hemoglobin: 12.2 g/dL — ABNORMAL LOW (ref 13.0–17.0)
MCH: 32.8 pg (ref 26.0–34.0)
MCHC: 34.7 g/dL (ref 30.0–36.0)
MCV: 94.6 fL (ref 80.0–100.0)
Platelets: 147 10*3/uL — ABNORMAL LOW (ref 150–400)
RBC: 3.72 MIL/uL — ABNORMAL LOW (ref 4.22–5.81)
RDW: 12.8 % (ref 11.5–15.5)
WBC: 9.6 10*3/uL (ref 4.0–10.5)
nRBC: 0 % (ref 0.0–0.2)

## 2019-06-16 LAB — BASIC METABOLIC PANEL
Anion gap: 7 (ref 5–15)
BUN: 13 mg/dL (ref 8–23)
CO2: 24 mmol/L (ref 22–32)
Calcium: 8.3 mg/dL — ABNORMAL LOW (ref 8.9–10.3)
Chloride: 107 mmol/L (ref 98–111)
Creatinine, Ser: 0.97 mg/dL (ref 0.61–1.24)
GFR calc Af Amer: >60 mL/min (ref >60)
GFR calc non Af Amer: >60 mL/min (ref >60)
Glucose, Bld: 217 mg/dL — ABNORMAL HIGH (ref 70–99)
Potassium: 4 mmol/L (ref 3.5–5.1)
Sodium: 138 mmol/L (ref 135–145)

## 2019-06-16 LAB — GLUCOSE, CAPILLARY
Glucose-Capillary: 176 mg/dL — ABNORMAL HIGH (ref 70–99)
Glucose-Capillary: 238 mg/dL — ABNORMAL HIGH (ref 70–99)

## 2019-06-16 MED ORDER — ONDANSETRON 4 MG PO TBDP: 4.0000 mg | ORAL_TABLET | Freq: Three times a day (TID) | ORAL | 0 refills | Status: DC | PRN

## 2019-06-16 MED ORDER — METRONIDAZOLE 500 MG PO TABS: 500.0000 mg | ORAL_TABLET | Freq: Three times a day (TID) | ORAL | 0 refills | Status: AC

## 2019-06-16 MED ORDER — APIXABAN 5 MG PO TABS: 5.0000 mg | ORAL_TABLET | Freq: Two times a day (BID) | ORAL | 0 refills | Status: DC

## 2019-06-16 MED ORDER — CIPROFLOXACIN HCL 500 MG PO TABS: 500.0000 mg | ORAL_TABLET | Freq: Two times a day (BID) | ORAL | 0 refills | Status: AC

## 2019-06-16 NOTE — Discharge Summary (Addendum)
Physician Discharge Summary   Patient ID: Travis [name] MRN: [id number] DOB/AGE: [birth year] 61 y.o.  Admit date: 06/13/2019 Discharge date: 06/16/2019  Primary Care Physician:  Kathyrn Drown, MD   Recommendations for Outpatient Follow-up:  1. Follow up with PCP in 1-2 weeks 2. Continue ciprofloxacin 500 mg twice daily and metronidazole 500 mg 3 times daily for 7 days 3. Follow-up with gastroenterology scheduled  Home Health: None, currently at baseline Equipment/Devices: None  Discharge Condition: stable  CODE STATUS: FULL    Diet recommendation: Soft, low residue diet   Discharge Diagnoses:   Acute ischemic colitis . GI bleed . Paroxysmal atrial fibrillation (HCC) . OSA (obstructive sleep apnea) . Mixed hyperlipidemia . Essential hypertension . Diabetic peripheral neuropathy ([place]) . DM type 2 causing vascular disease ([place])    Consults: Gastroenterology    Allergies:   Allergies  Allergen Reactions  . Lisinopril Cough    Patient/spouse is not aware/familiar with why this is listed as an allergy     DISCHARGE MEDICATIONS: Allergies as of 06/16/2019      Reactions   Lisinopril Cough   Patient/spouse is not aware/familiar with why this is listed as an allergy      Medication List    STOP taking these medications   aspirin EC 81 MG tablet     TAKE these medications   Accu-Chek FastClix Lancets Misc 1 Dose by Does not apply route 4 (four) times daily.   ALPRAZolam 0.25 MG tablet Commonly known as: XANAX TAKE 1 TABLET BY MOUTH TWO TIMES DAILY AS NEEDED What changed:   how much to take  how to take this  when to take this  reasons to take this   apixaban 5 MG Tabs tablet Commonly known as: Eliquis Take 1 tablet (5 mg total) by mouth 2 (two) times daily. Appointment Required For Further Refills [phone] Start taking on: June 17, 2019   atorvastatin 20 MG tablet Commonly known as: LIPITOR TAKE 1 TABLET BY MOUTH ONCE DAILY   Blood  Glucose Monitor System w/Device Kit Test glucose 2 time daily.  Meter/strips/lancets. Please dispense per patient/insurance preferrence   Cholecalciferol 50 MCG (2000 UT) Caps Take 1 capsule (2,000 Units total) by mouth daily with breakfast.   ciprofloxacin 500 MG tablet Commonly known as: CIPRO Take 1 tablet (500 mg total) by mouth 2 (two) times daily for 7 days.   fluticasone 50 MCG/ACT nasal spray Commonly known as: FLONASE Place 1 spray into both nostrils daily.   folic acid 1 MG tablet Commonly known as: FOLVITE TAKE 1 TABLET BY MOUTH DAILY.   hyoscyamine 0.125 MG SL tablet Commonly known as: LEVSIN SL Place 1 tablet (0.125 mg total) under the tongue daily before breakfast.   Lantus SoloStar 100 UNIT/ML Solostar Pen Generic drug: Insulin Glargine INJECT 40 UNITS INTO THE SKIN AT BEDTIME.   meclizine 25 MG tablet Commonly known as: ANTIVERT TAKE 1 TABLET BY MOUTH 3 TIMES DAILY AS NEEDED FOR DIZZINESS What changed: See the new instructions.   metFORMIN 500 MG tablet Commonly known as: GLUCOPHAGE TAKE 1 TABLET BY MOUTH TWO TIMES DAILY   metroNIDAZOLE 500 MG tablet Commonly known as: Flagyl Take 1 tablet (500 mg total) by mouth 3 (three) times daily for 7 days.   multivitamin with minerals Tabs tablet Take 1 tablet daily by mouth.   ondansetron 4 MG disintegrating tablet Commonly known as: Zofran ODT Take 1 tablet (4 mg total) by mouth every 8 (eight) hours as needed for nausea  or vomiting.   pregabalin 100 MG capsule Commonly known as: LYRICA TAKE 1 CAPSULE BY MOUTH 3 TIMES DAILY   Unifine Pentips 31G X 8 MM Misc Generic drug: Insulin Pen Needle USE AS DIRECTED WITH LEVEMIR INJECTIONS DAILY   Pen Needles 31G X 6 MM Misc 1 each by Does not apply route daily.   valACYclovir 1000 MG tablet Commonly known as: VALTREX TAKE 1 TABLET BY MOUTH ONCE DAILY        Brief H and P: For complete details please refer to admission H and P, but in brief *Patient is a  59 year old male with paroxysmal A. fib, anxiety/depression, osteoarthritis, diabetes mellitus type 2, neuropathy, diverticulosis, hyperlipidemia, hypertension, OSA on CPAP, history of CVA presented to ED with rectal bleeding associated with abdominal pain, flatulence.  Per patient pain had been so intense that he was unable to sleep, had 1 episode of emesis on the morning of admission, denied hematemesis. COVID-19 test negative CT abdomen pelvis showed borderline wall thickening in the rectum.  C. difficile negative.  Hospital Course:   Abdominal pain with rectal bleeding secondary to acute ischemic colitis -GI consulted.  Patient underwent colonoscopy today which showed diffuse severe inflammation from cecum to the transverse colon secondary to ischemic colitis.   - Biopsies performed, confirmed ischemic colitis. -Patient was placed on IV ciprofloxacin and Flagyl, leukocytosis, abdominal pain improved -Tolerating solids -Per GI, Dr. Loletha Carrow, continue oral ciprofloxacin and Flagyl for 7 days, outpatient follow-up with primary GI, Dr. Henrene Pastor has been scheduled.   Diabetes mellitus type 2, with complications, neuropathy, IDDM, uncontrolled with hyperglycemia -Hemoglobin A1c 8.0 -Continue insulin regimen at home    Essential hypertension -BP currently stable    Mixed hyperlipidemia -Continue statin    OSA (obstructive sleep apnea) -CPAP qhs     Paroxysmal atrial fibrillation ([place]) -Per GI recommendations, Dr. Henrene Pastor, resume anticoagulation at discharge Currently normal sinus rhythm  Management and discharge instructions were discussed with patient's wife on the phone.  Day of Discharge S: Feels a lot better today, abdominal pain improved, no rectal bleeding this morning.  Hoping to go home.  No fevers.  BP 133/87 (BP Location: Right Arm)   Pulse 75   Temp 98.4 F (36.9 C) (Oral)   Resp 19   Ht _0  (1.854 m)   Wt 86.2 kg   SpO2 97%   BMI 25.07 kg/m   Physical  Exam: General: Alert and awake oriented x3 not in any acute distress. HEENT: anicteric sclera, pupils reactive to light and accommodation CVS: S1-S2 clear no murmur rubs or gallops Chest: clear to auscultation bilaterally, no wheezing rales or rhonchi Abdomen: soft nontender, nondistended, normal bowel sounds Extremities: no cyanosis, clubbing or edema noted bilaterally Neuro: Cranial nerves II-XII intact, no focal neurological deficits   Please note  You were cared for by a hospitalist during your hospital stay. Once you are discharged, your primary care physician will handle any further medical issues. Please note that NO REFILLS for any discharge medications will be authorized once you are discharged, as it is imperative that you return to your primary care physician (or establish a relationship with a primary care physician if you do not have one) for your aftercare needs so that they can reassess your need for medications and monitor your lab values.   The results of significant diagnostics from this hospitalization (including imaging, microbiology, ancillary and laboratory) are listed below for reference.      Procedures/Studies:  CT Abdomen Pelvis W Contrast  Result Date: 06/13/2019 CLINICAL DATA:  Altered bowel habits with loose stools and fecal urgency. Low back pain. EXAM: CT ABDOMEN AND PELVIS WITH CONTRAST TECHNIQUE: Multidetector CT imaging of the abdomen and pelvis was performed using the standard protocol following bolus administration of intravenous contrast. CONTRAST:  161m OMNIPAQUE IOHEXOL 300 MG/ML  SOLN COMPARISON:  Report from 10/01/2002 FINDINGS: Lower chest: Unremarkable Hepatobiliary: Unremarkable Pancreas: Unremarkable Spleen: Unremarkable Adrenals/Urinary Tract: Three nonobstructive right renal calculi are present, the largest 0.4 cm in the lower pole on image 33/4. A 0.1 cm left kidney lower pole nonobstructive renal calculus is present. No hydronephrosis or  hydroureter. Mild scarring in the right kidney lower pole. Fluid density 1.1 cm exophytic lesion from the left mid kidney posterolaterally favors a cyst. Urinary bladder unremarkable. A 2.1 by 1.3 cm mass of the lateral limb of the left adrenal gland has a portal venous phase density of 58 Hounsfield units and a delayed phase density of 31 Hounsfield units, yielding a relative washout of 47% consistent with adenoma. Stomach/Bowel: Postoperative findings in the sigmoid colon. Orally administered contrast extends through to the distal colon. Borderline wall thickening in the rectum although this may be due to nondistention. No dilated small bowel. Normal appendix. Vascular/Lymphatic: Mild aortoiliac atherosclerotic vascular disease. Reproductive: Mild prostatomegaly. Other: No supplemental non-categorized findings. Musculoskeletal: Small umbilical hernia contains adipose tissue. Lumbar spondylosis and degenerative disc disease with degenerative retrolisthesis at L3-4 and L4-5. These factors result and bilateral foraminal impingement at L3-4, L4-5, and L5-S1. Prominent loss of disc height at each of these 3 levels as well. IMPRESSION: 1. Borderline wall thickening in the rectum may be due to nondistention. Postoperative findings in the sigmoid colon without complicating feature. 2. Bilateral nonobstructive nephrolithiasis. 3. Mild prostatomegaly. 4. Lumbar spondylosis and degenerative disc disease causing bilateral foraminal impingement at L3-4, L4-5, and L5-S1. 5. Small left adrenal adenoma. 6. Small umbilical hernia contains adipose tissue. Aortic Atherosclerosis (ICD10-I70.0). Electronically Signed   By: WVan ClinesM.D.   On: 06/13/2019 09:44       LAB RESULTS: Basic Metabolic Panel: Recent Labs  Lab 06/13/19 1020 06/14/19 0525 06/15/19 0445 06/16/19 0520  NA 141   < > 136 138  K 3.6   < > 3.9 4.0  CL 106   < > 107 107  CO2 26   < > 22 24  GLUCOSE 190*   < > 200* 217*  BUN 19   < > 9 13   CREATININE 1.01   < > 0.93 0.97  CALCIUM 8.9   < > 8.2* 8.3*  MG 2.0  --   --   --   PHOS 3.2  --   --   --    < > = values in this interval not displayed.   Liver Function Tests: Recent Labs  Lab 06/13/19 1020 06/14/19 0525  AST 16 15  ALT 11 11  ALKPHOS 63 65  BILITOT 0.8 1.5*  PROT 7.5 6.7  ALBUMIN 4.5 3.8   Recent Labs  Lab 06/13/19 1020  LIPASE 32   No results for input(s): AMMONIA in the last 168 hours. CBC: Recent Labs  Lab 06/15/19 0922 06/15/19 0922 06/16/19 0520  WBC 15.7*  --  9.6  HGB 14.4  --  12.2*  HCT 41.5  --  35.2*  MCV 95.6   < > 94.6  PLT 178  --  147*   < > = values in this interval not displayed.   Cardiac Enzymes: No results for  input(s): CKTOTAL, CKMB, CKMBINDEX, TROPONINI in the last 168 hours. BNP: Invalid input(s): POCBNP CBG: Recent Labs  Lab 06/15/19 2135 06/16/19 0827  GLUCAP 244* 176*       Disposition and Follow-up: Discharge Instructions    Diet Carb Modified   Complete by: As directed    Discharge instructions   Complete by: As directed    LOW RESIDUE, SOFT DIET   Increase activity slowly   Complete by: As directed        DISPOSITION: Home   DISCHARGE FOLLOW-UP Follow-up Information    Irene Shipper, MD Follow up on 07/12/2019.   Specialty: Gastroenterology Why: 9:20 AM Contact information: [address] [place] [address] [phone]        Kathyrn Drown, MD. Schedule an appointment as soon as possible for a visit in 2 week(s).   Specialty: Family Medicine Contact information: [address] [place] [address] [phone]            Time coordinating discharge:  35 minutes  Signed:   Estill Cotta M.D. [facility] 06/16/2019, 10:18 AM

## 2019-06-16 NOTE — Progress Notes (Signed)
Nsg Discharge Note  Admit Date:  06/13/2019 Discharge date: 06/16/2019   Jackey Loge to be D/C'd Home per MD order.  AVS completed.  Copy for chart, and copy for patient signed, and dated. Patient/caregiver able to verbalize understanding.  Discharge Medication: Allergies as of 06/16/2019      Reactions   Lisinopril Cough   Patient/spouse is not aware/familiar with why this is listed as an allergy      Medication List    STOP taking these medications   aspirin EC 81 MG tablet     TAKE these medications   Accu-Chek FastClix Lancets Misc 1 Dose by Does not apply route 4 (four) times daily.   ALPRAZolam 0.25 MG tablet Commonly known as: XANAX TAKE 1 TABLET BY MOUTH TWO TIMES DAILY AS NEEDED What changed:   how much to take  how to take this  when to take this  reasons to take this   apixaban 5 MG Tabs tablet Commonly known as: Eliquis Take 1 tablet (5 mg total) by mouth 2 (two) times daily. Appointment Required For Further Refills [phone] Start taking on: June 17, 2019   atorvastatin 20 MG tablet Commonly known as: LIPITOR TAKE 1 TABLET BY MOUTH ONCE DAILY   Blood Glucose Monitor System w/Device Kit Test glucose 2 time daily.  Meter/strips/lancets. Please dispense per patient/insurance preferrence   Cholecalciferol 50 MCG (2000 UT) Caps Take 1 capsule (2,000 Units total) by mouth daily with breakfast.   ciprofloxacin 500 MG tablet Commonly known as: CIPRO Take 1 tablet (500 mg total) by mouth 2 (two) times daily for 7 days.   fluticasone 50 MCG/ACT nasal spray Commonly known as: FLONASE Place 1 spray into both nostrils daily.   folic acid 1 MG tablet Commonly known as: FOLVITE TAKE 1 TABLET BY MOUTH DAILY.   hyoscyamine 0.125 MG SL tablet Commonly known as: LEVSIN SL Place 1 tablet (0.125 mg total) under the tongue daily before breakfast.   Lantus SoloStar 100 UNIT/ML Solostar Pen Generic drug: Insulin Glargine INJECT 40 UNITS INTO THE  SKIN AT BEDTIME.   meclizine 25 MG tablet Commonly known as: ANTIVERT TAKE 1 TABLET BY MOUTH 3 TIMES DAILY AS NEEDED FOR DIZZINESS What changed: See the new instructions.   metFORMIN 500 MG tablet Commonly known as: GLUCOPHAGE TAKE 1 TABLET BY MOUTH TWO TIMES DAILY   metroNIDAZOLE 500 MG tablet Commonly known as: Flagyl Take 1 tablet (500 mg total) by mouth 3 (three) times daily for 7 days.   multivitamin with minerals Tabs tablet Take 1 tablet daily by mouth.   ondansetron 4 MG disintegrating tablet Commonly known as: Zofran ODT Take 1 tablet (4 mg total) by mouth every 8 (eight) hours as needed for nausea or vomiting.   pregabalin 100 MG capsule Commonly known as: LYRICA TAKE 1 CAPSULE BY MOUTH 3 TIMES DAILY   Unifine Pentips 31G X 8 MM Misc Generic drug: Insulin Pen Needle USE AS DIRECTED WITH LEVEMIR INJECTIONS DAILY   Pen Needles 31G X 6 MM Misc 1 each by Does not apply route daily.   valACYclovir 1000 MG tablet Commonly known as: VALTREX TAKE 1 TABLET BY MOUTH ONCE DAILY       Discharge Assessment: Vitals:   06/15/19 2132 06/16/19 0538  BP: (!) 138/98 133/87  Pulse: 83 75  Resp: 20 19  Temp: 98.6 F (37 C) 98.4 F (36.9 C)  SpO2: 98% 97%   Skin clean, dry and intact without evidence of skin break down, no  evidence of skin tears noted. IV catheter discontinued intact. Site without signs and symptoms of complications - no redness or edema noted at insertion site, patient denies c/o pain - only slight tenderness at site.  Dressing with slight pressure applied.  D/c Instructions-Education: Discharge instructions given to patient/family with verbalized understanding. D/c education completed with patient/family including follow up instructions, medication list, d/c activities limitations if indicated, with other d/c instructions as indicated by MD - patient able to verbalize understanding, all questions fully answered. Patient instructed to return to ED, call  911, or call MD for any changes in condition.  Patient escorted via [place], and D/C home via private auto.  Eustace Pen, RN 06/16/2019 1:50 PM

## 2019-06-16 NOTE — Telephone Encounter (Signed)
Nurses Patient was recently in the hospital with GI bleed It was recommended for him to follow-up with [place] within 7 to 14 days but also follow-up with gastroenterology It does appear he has a follow-up with gastroenterology in several weeks I would recommend the patient do a follow-up with [place] in 7 to 10 days for office visit in office we can check a fingerstick hemoglobin If patient defers on follow-up visit with [place] document accordingly

## 2019-06-16 NOTE — Progress Notes (Signed)
I will be at [facility] hospital much of the day, so chart check done.  WBC normal, no reported bleeding ovn. Colon biopsies confirmed ischemic colitis  If patient still feels as well as yesterday, he can be discharged home today.  Please prescribe him 7 days of ciprofloxacin 500 mg Arthor Captain daily and metronidazole 500 mg 3 times daily. His primary GI physician, Dr. Scarlette Shorts, had seen him yesterday and is already aware patient will likely be discharged, so follow-up outpatient plans can be made.

## 2019-06-18 ENCOUNTER — Encounter: Payer: Self-pay | Admitting: *Deleted

## 2019-06-18 ENCOUNTER — Other Ambulatory Visit: Payer: Self-pay | Admitting: *Deleted

## 2019-06-18 NOTE — Patient Outreach (Signed)
[name]) Care Management  06/18/2019  [name] 1960-12-05 [id number]   Transition of care call/case closure   Referral received:06/15/19 Initial outreach:06/18/19 Insurance: [facility] Focus plan   Subjective: Initial successful telephone call to patient's preferred number in order to complete transition of care assessment; Patient wife answers phone, Travis [name] , [place] states they are on speaker phone,  2 HIPAA identifiers verified. Explained purpose of call and completed transition of care assessment.  Spoke with patient, he states feeling better but still weak. He discussed abdominal discomfort is subsiding. He reports tolerating diet,described one brief episode of nausea on vomiting did not need to take zofran.. Wife request additional education material on low residue diet, information foods to limit in diet such as raw fruits, vegetables, popcorn fried foods, as examples.  He reports having bowel movements no noted blood in stool reinforced notifying MD of noted blood in stool and he verified that is his plan.  denies bowel or bladder problems.  Spouse/children are assisting with his/her recovery.  Patient states he is active with active health management health coach for chronic condition management of diabetes, hypertension. He is also followed  [facility].  Wife states that she does have hospital indemnity plan, provided contact number to file a claim, also provided information on [employer] absence , as she is interested in having intermittent FMLA in place on her spouse behalf as she attends appointments with him. She voiced appreciation of information provided on today.  He uses a [facility] outpatient pharmacy at [employer]  They  deny educational needs related to staying safe during the [place] 19 pandemic.    Objective:  [name] was hospitalized at [facility]  From 2/10-2/13/2021 for Acute Ischemic colitis  Comorbidities  include:  Diabetes type 2, hypertension, peripheral neuropathy, stroke, atrial fib  He was discharged to home on 06/16/19 without the need for home health services or DME.   Assessment:  Patient and wife  voices good understanding of all discharge instructions.  See transition of care flowsheet for assessment details.   Plan:  Reviewed hospital discharge diagnosis of Acute Ischemic colitis   and discharge treatment plan using hospital discharge instructions, assessing medication adherence, reviewing problems requiring provider notification, and discussing the importance of follow up with  primary care provider and/or specialists as directed. Reviewed [place] healthy lifestyle program information to receive discounted premium for  2022  Step 1: Get annual physical between May 03, 2018 and November 01, 2019; Step 2: Complete your health assessment between May 04, 2019 and January 02, 2020 at [email or web address] Step 3:Identify your current health status and complete the corresponding action step between January 1, and January 02, 2020.   Using [place] website, verified that patient is an active participate in [place] [employer] [facility] [employer] chronic disease management program.   Will send EMMI education handout on low residue diet , soft diet and low fiber diet.   No ongoing care management needs identified so will close case to [place] [facility] services and route successful outreach letter with [place] Management pamphlet and 24 Hour Nurse Line Magnet to [place] Management clinical pool to be mailed to patient's home address.     Joylene Draft, RN, BSN  [place] Management Coordinator  [phone]- Mobile [phone]- Toll Free Main Office

## 2019-06-18 NOTE — Telephone Encounter (Signed)
Spoke with patient wife. Pt wife verbalized understanding. Pt wife transferred up front to set up appt.

## 2019-06-20 ENCOUNTER — Other Ambulatory Visit: Payer: Self-pay

## 2019-06-20 MED ORDER — BASAGLAR KWIKPEN 100 UNIT/ML ~~LOC~~ SOPN: 40.0000 [IU] | PEN_INJECTOR | Freq: Every day | SUBCUTANEOUS | 0 refills | Status: DC

## 2019-06-22 ENCOUNTER — Other Ambulatory Visit (HOSPITAL_COMMUNITY): Payer: Self-pay | Admitting: *Deleted

## 2019-06-22 MED ORDER — APIXABAN 5 MG PO TABS: 5.0000 mg | ORAL_TABLET | Freq: Two times a day (BID) | ORAL | 3 refills | Status: DC

## 2019-06-25 ENCOUNTER — Ambulatory Visit: Payer: No Typology Code available for payment source | Admitting: Adult Health

## 2019-06-25 MED FILL — SHINGRIX 50 MCG SUS: 50 | 1 days supply | Qty: 1 | Fill #1

## 2019-06-26 ENCOUNTER — Other Ambulatory Visit: Payer: Self-pay

## 2019-06-26 ENCOUNTER — Encounter: Payer: Self-pay | Admitting: Family Medicine

## 2019-06-26 ENCOUNTER — Ambulatory Visit (INDEPENDENT_AMBULATORY_CARE_PROVIDER_SITE_OTHER): Payer: No Typology Code available for payment source | Admitting: Family Medicine

## 2019-06-26 VITALS — BP 136/90 | Temp 95.5°F | Wt 186.2 lb

## 2019-06-26 DIAGNOSIS — I700 Atherosclerosis of aorta: Secondary | ICD-10-CM

## 2019-06-26 DIAGNOSIS — E1159 Type 2 diabetes mellitus with other circulatory complications: Secondary | ICD-10-CM | POA: Diagnosis not present

## 2019-06-26 DIAGNOSIS — K559 Vascular disorder of intestine, unspecified: Secondary | ICD-10-CM | POA: Diagnosis not present

## 2019-06-26 DIAGNOSIS — D509 Iron deficiency anemia, unspecified: Secondary | ICD-10-CM | POA: Diagnosis not present

## 2019-06-26 DIAGNOSIS — I480 Paroxysmal atrial fibrillation: Secondary | ICD-10-CM

## 2019-06-26 DIAGNOSIS — D3502 Benign neoplasm of left adrenal gland: Secondary | ICD-10-CM

## 2019-06-26 NOTE — Progress Notes (Signed)
Subjective:    Patient ID: Travis [name], male    DOB: 10-09-1960, 59 y.o.   MRN: [id number] Transitional care Recently in the hospital for severe bleeding Does need some follow-up lab work Patient was connected with within 2 days of discharge  HPI Pt here for hospital follow up. Pt was in ER from 06/13/19-06/16/19 for rectal bleeding. Pt had CT scan and that night he began to have bright red stools and was unable to get out of bathroom. Pt states that during the CT scan he "felt something". Pt states he felt the warmth of the dye but then he felt something else. Pt has not been taking Levisn since being out of hospital. Pt has not had any blood in stool. Pt has been weak and sore but is trying to move around.    Review of Systems  Constitutional: Negative for diaphoresis and fatigue.  HENT: Negative for congestion and rhinorrhea.   Respiratory: Negative for cough and shortness of breath.   Cardiovascular: Negative for chest pain and leg swelling.  Gastrointestinal: Negative for abdominal pain and diarrhea.  Skin: Negative for color change and rash.  Neurological: Negative for dizziness and headaches.  Psychiatric/Behavioral: Negative for behavioral problems and confusion.       Objective:   Physical Exam Constitutional:      Appearance: He is well-developed.  HENT:     Head: Normocephalic and atraumatic.     Right Ear: External ear normal.     Left Ear: External ear normal.     Nose: Nose normal.  Eyes:     Pupils: Pupils are equal, round, and reactive to light.  Neck:     Thyroid: No thyromegaly.  Cardiovascular:     Rate and Rhythm: Normal rate and regular rhythm.     Heart sounds: Normal heart sounds. No murmur.  Pulmonary:     Effort: Pulmonary effort is normal. No respiratory distress.     Breath sounds: Normal breath sounds. No wheezing.  Abdominal:     General: Bowel sounds are normal. There is no distension.     Palpations: Abdomen is soft. There is no mass.     Tenderness: There is no abdominal tenderness.  Genitourinary:    Penis: Normal.   Musculoskeletal:        General: Normal range of motion.     Cervical back: Normal range of motion and neck supple.  Lymphadenopathy:     Cervical: No cervical adenopathy.  Skin:    General: Skin is warm and dry.     Findings: No erythema.  Neurological:     Mental Status: He is alert.     Motor: No abnormal muscle tone.  Psychiatric:        Behavior: Behavior normal.        Judgment: Judgment normal.           Assessment & Plan:  1. Ischemic colitis ([place]) So there is concern that this could happen again to him we did discuss warning signs what to watch for also discussed the importance of keeping diabetes blood pressure and cholesterol under good control we will check blood count because of the concern of the possibility of anemia with his blood loss - CBC with Differential - Ferritin - Basic Metabolic Panel (BMET) - Iron Binding Cap (TIBC)([facility])  2. DM type 2 causing vascular disease ([place]) Diabetes very important to get this under good control follow-up with endocrinology - CBC with Differential - Ferritin - Basic Metabolic  Panel (BMET) - Iron Binding Cap (TIBC)([facility])  3. Paroxysmal atrial fibrillation (H[facility]) Atrial fibrillation stable currently on anticoagulant not having any bleeding problems currently - CBC with Differential - Ferritin - Basic Metabolic Panel (BMET) - Iron Binding Cap (TIBC)([facility])  4. Iron deficiency anemia, unspecified iron deficiency anemia type We will check lab work to see what his iron storage is doing - CBC with Differential - Ferritin - Basic Metabolic Panel (BMET) - Iron Binding Cap (TIBC)([facility])  5. Hypocalcemia Patient had hypocalcemia while in the hospital we will do a follow-up on this - CBC with Differential - Ferritin - Basic Metabolic Panel (BMET) - Iron Binding Cap  (TIBC)([facility])  Adrenal adenoma will bring this to the attention of endocrinology so that they can do work-up to make sure that it is not a active adenoma  Aortic atherosclerosis we did discuss importance keeping cholesterol under good control get an A1c under better control patient will work hard at this and follow-up with endocrinology patient will continue cholesterol medicine and will do appropriate lab work through [place] on a regular basis  We will have the patient follow-up come summer

## 2019-06-27 ENCOUNTER — Telehealth: Payer: Self-pay | Admitting: "Endocrinology

## 2019-06-27 ENCOUNTER — Other Ambulatory Visit: Payer: Self-pay | Admitting: "Endocrinology

## 2019-06-27 DIAGNOSIS — D3502 Benign neoplasm of left adrenal gland: Secondary | ICD-10-CM

## 2019-06-27 LAB — BASIC METABOLIC PANEL
BUN/Creatinine Ratio: 16 (ref 10–24)
BUN: 15 mg/dL (ref 8–27)
CO2: 23 mmol/L (ref 20–29)
Calcium: 9.4 mg/dL (ref 8.6–10.2)
Chloride: 107 mmol/L — ABNORMAL HIGH (ref 96–106)
Creatinine, Ser: 0.96 mg/dL (ref 0.76–1.27)
GFR calc Af Amer: 98 mL/min/{1.73_m2} (ref >59)
GFR calc non Af Amer: 85 mL/min/{1.73_m2} (ref >59)
Glucose: 85 mg/dL (ref 65–99)
Potassium: 4.4 mmol/L (ref 3.5–5.2)
Sodium: 142 mmol/L (ref 134–144)

## 2019-06-27 LAB — CBC WITH DIFFERENTIAL/PLATELET
Basophils Absolute: 0.1 10*3/uL (ref 0.0–0.2)
Basos: 1 %
EOS (ABSOLUTE): 0.3 10*3/uL (ref 0.0–0.4)
Eos: 4 %
Hematocrit: 41.8 % (ref 37.5–51.0)
Hemoglobin: 14.4 g/dL (ref 13.0–17.7)
Immature Grans (Abs): 0 10*3/uL (ref 0.0–0.1)
Immature Granulocytes: 0 %
Lymphocytes Absolute: 1.8 10*3/uL (ref 0.7–3.1)
Lymphs: 25 %
MCH: 32.5 pg (ref 26.6–33.0)
MCHC: 34.4 g/dL (ref 31.5–35.7)
MCV: 94 fL (ref 79–97)
Monocytes Absolute: 0.8 10*3/uL (ref 0.1–0.9)
Monocytes: 11 %
Neutrophils Absolute: 4.2 10*3/uL (ref 1.4–7.0)
Neutrophils: 59 %
Platelets: 274 10*3/uL (ref 150–450)
RBC: 4.43 x10E6/uL (ref 4.14–5.80)
RDW: 13.1 % (ref 11.6–15.4)
WBC: 7.1 10*3/uL (ref 3.4–10.8)

## 2019-06-27 LAB — IRON AND TIBC
Iron Saturation: 24 % (ref 15–55)
Iron: 71 ug/dL (ref 38–169)
Total Iron Binding Capacity: 294 ug/dL (ref 250–450)
UIBC: 223 ug/dL (ref 111–343)

## 2019-06-27 LAB — FERRITIN: Ferritin: 58 ng/mL (ref 30–400)

## 2019-06-27 NOTE — Telephone Encounter (Signed)
I reviewed the CT. We can discuss it during his next visit, but would like to add some tests to his previsit labs. See orders.

## 2019-06-27 NOTE — Telephone Encounter (Signed)
Pt's wife called and said that he recently was in the hospital and had a CT scan. They seen that he had an enlarged adrenal gland. Do you want to see him?

## 2019-06-28 NOTE — Telephone Encounter (Signed)
Spoke w/ [name] (wife)

## 2019-06-29 ENCOUNTER — Other Ambulatory Visit: Payer: Self-pay | Admitting: "Endocrinology

## 2019-06-29 MED FILL — MECLIZINE 25 MG TABLET: 25 | 10 days supply | Qty: 30 | Fill #1

## 2019-06-29 MED FILL — OSCIMIN SL 0.125 MG TABLET: 0.125 | 30 days supply | Qty: 30 | Fill #1

## 2019-07-02 MED FILL — metFORMIN HCL 500 MG TABS: 500 | 45 days supply | Qty: 90 | Fill #0

## 2019-07-04 ENCOUNTER — Telehealth: Payer: Self-pay

## 2019-07-04 NOTE — Telephone Encounter (Signed)
I wan to him to take Metformin 500 mg po twice a day.

## 2019-07-04 NOTE — Telephone Encounter (Signed)
[place] with clarification order.

## 2019-07-04 NOTE — Telephone Encounter (Signed)
Pt called stating he has been taking metformin 1000mg  b.i.d at his last visit his note states to continue metformin 500mg  b.i.d. the pharmacy just wants to clarify dose.

## 2019-07-05 ENCOUNTER — Telehealth: Payer: Self-pay | Admitting: Family Medicine

## 2019-07-05 ENCOUNTER — Other Ambulatory Visit: Payer: Self-pay

## 2019-07-05 ENCOUNTER — Ambulatory Visit (INDEPENDENT_AMBULATORY_CARE_PROVIDER_SITE_OTHER): Payer: No Typology Code available for payment source | Admitting: Adult Health

## 2019-07-05 ENCOUNTER — Encounter: Payer: Self-pay | Admitting: Adult Health

## 2019-07-05 VITALS — BP 136/81 | HR 94 | Ht 73.0 in | Wt 187.0 lb

## 2019-07-05 DIAGNOSIS — M5412 Radiculopathy, cervical region: Secondary | ICD-10-CM

## 2019-07-05 DIAGNOSIS — E782 Mixed hyperlipidemia: Secondary | ICD-10-CM | POA: Diagnosis not present

## 2019-07-05 DIAGNOSIS — I480 Paroxysmal atrial fibrillation: Secondary | ICD-10-CM

## 2019-07-05 DIAGNOSIS — E1142 Type 2 diabetes mellitus with diabetic polyneuropathy: Secondary | ICD-10-CM

## 2019-07-05 DIAGNOSIS — I10 Essential (primary) hypertension: Secondary | ICD-10-CM | POA: Diagnosis not present

## 2019-07-05 DIAGNOSIS — Z8673 Personal history of transient ischemic attack (TIA), and cerebral infarction without residual deficits: Secondary | ICD-10-CM | POA: Diagnosis not present

## 2019-07-05 MED ORDER — GABAPENTIN 300 MG PO CAPS: 300.0000 mg | ORAL_CAPSULE | Freq: Three times a day (TID) | ORAL | 0 refills | Status: DC

## 2019-07-05 MED FILL — metFORMIN HCL 500 MG TABS: 500 | 90 days supply | Qty: 180 | Fill #0

## 2019-07-05 MED FILL — GABAPENTIN 300 MG CAPSULE: 300 | 90 days supply | Qty: 270 | Fill #0

## 2019-07-05 NOTE — Telephone Encounter (Signed)
Patient's wife had FMLA dropped off to be completed I filled in what I could please review and fill in. In your folder.
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
## 2019-07-05 NOTE — Patient Instructions (Addendum)
Recommend starting gabapentin 300mg  three times daily - try using at night first and then slowly start taking day time - may cause increased fatigue - additional information provided below. Please call office with any questions or concerns. Please call after 2 weeks if dosage needs to be increased  Recommend obtaining imaging of your cervical neck for ongoing left arm pain - this can also be done by your PCP when you are ready  Continue Eliquis (apixaban) daily  and Lipitor for secondary stroke prevention  Continue to follow up with PCP regarding cholesterol, blood pressure and diabetes management   Continue to monitor blood pressure at home  Maintain strict control of hypertension with blood pressure goal below 130/90, diabetes with hemoglobin A1c goal below 6.5% and cholesterol with LDL cholesterol (bad cholesterol) goal below 70 mg/dL. I also advised the patient to eat a healthy diet with plenty of whole grains, cereals, fruits and vegetables, exercise regularly and maintain ideal body weight.  Followup in the future with me in 6 months or call earlier if needed       Thank you for coming to see [place] at [facility]. I hope we have been able to provide you high quality care today.  You may receive a patient satisfaction survey over the next few weeks. We would appreciate your feedback and comments so that we may continue to improve ourselves and the health of our patients.    Gabapentin capsules or tablets What is this medicine? GABAPENTIN (GA ba pen tin) is used to control seizures in certain types of epilepsy. It is also used to treat certain types of nerve pain. This medicine may be used for other purposes; ask your health care provider or pharmacist if you have questions. COMMON BRAND NAME(S): Active-PAC with Gabapentin, Gabarone, Neurontin What should I tell my health care provider before I take this medicine? They need to know if you have any of these  conditions:  history of drug abuse or alcohol abuse problem  kidney disease  lung or breathing disease  suicidal thoughts, plans, or attempt; a previous suicide attempt by you or a family member  an unusual or allergic reaction to gabapentin, other medicines, foods, dyes, or preservatives  pregnant or trying to get pregnant  breast-feeding How should I use this medicine? Take this medicine by mouth with a glass of water. Follow the directions on the prescription label. You can take it with or without food. If it upsets your stomach, take it with food. Take your medicine at regular intervals. Do not take it more often than directed. Do not stop taking except on your doctor's advice. If you are directed to break the 600 or 800 mg tablets in half as part of your dose, the extra half tablet should be used for the next dose. If you have not used the extra half tablet within 28 days, it should be thrown away. A special MedGuide will be given to you by the pharmacist with each prescription and refill. Be sure to read this information carefully each time. Talk to your pediatrician regarding the use of this medicine in children. While this drug may be prescribed for children as young as 3 years for selected conditions, precautions do apply. Overdosage: If you think you have taken too much of this medicine contact a poison control center or emergency room at once. NOTE: This medicine is only for you. Do not share this medicine with others. What if I miss a dose? If you  miss a dose, take it as soon as you can. If it is almost time for your next dose, take only that dose. Do not take double or extra doses. What may interact with this medicine? This medicine may interact with the following medications:  alcohol  antihistamines for allergy, cough, and cold  certain medicines for anxiety or sleep  certain medicines for depression like amitriptyline, fluoxetine, sertraline  certain medicines for  seizures like phenobarbital, primidone  certain medicines for stomach problems  general anesthetics like halothane, isoflurane, methoxyflurane, propofol  local anesthetics like lidocaine, pramoxine, tetracaine  medicines that relax muscles for surgery  narcotic medicines for pain  phenothiazines like chlorpromazine, mesoridazine, prochlorperazine, thioridazine This list may not describe all possible interactions. Give your health care provider a list of all the medicines, herbs, non-prescription drugs, or dietary supplements you use. Also tell them if you smoke, drink alcohol, or use illegal drugs. Some items may interact with your medicine. What should I watch for while using this medicine? Visit your doctor or health care provider for regular checks on your progress. You may want to keep a record at home of how you feel your condition is responding to treatment. You may want to share this information with your doctor or health care provider at each visit. You should contact your doctor or health care provider if your seizures get worse or if you have any new types of seizures. Do not stop taking this medicine or any of your seizure medicines unless instructed by your doctor or health care provider. Stopping your medicine suddenly can increase your seizures or their severity. This medicine may cause serious skin reactions. They can happen weeks to months after starting the medicine. Contact your health care provider right away if you notice fevers or flu-like symptoms with a rash. The rash may be red or purple and then turn into blisters or peeling of the skin. Or, you might notice a red rash with swelling of the face, lips or lymph nodes in your neck or under your arms. Wear a medical identification bracelet or chain if you are taking this medicine for seizures, and carry a card that lists all your medications. You may get drowsy, dizzy, or have blurred vision. Do not drive, use machinery, or do  anything that needs mental alertness until you know how this medicine affects you. To reduce dizzy or fainting spells, do not sit or stand up quickly, especially if you are an older patient. Alcohol can increase drowsiness and dizziness. Avoid alcoholic drinks. Your mouth may get dry. Chewing sugarless gum or sucking hard candy, and drinking plenty of water will help. The use of this medicine may increase the chance of suicidal thoughts or actions. Pay special attention to how you are responding while on this medicine. Any worsening of mood, or thoughts of suicide or dying should be reported to your health care provider right away. Women who become pregnant while using this medicine may enroll in the [place] Pregnancy Registry by calling [phone]. This registry collects information about the safety of antiepileptic drug use during pregnancy. What side effects may I notice from receiving this medicine? Side effects that you should report to your doctor or health care professional as soon as possible:  allergic reactions like skin rash, itching or hives, swelling of the face, lips, or tongue  breathing problems  rash, fever, and swollen lymph nodes  redness, blistering, peeling or loosening of the skin, including inside the mouth  suicidal thoughts, mood changes Side effects that usually do not require medical attention (report to your doctor or health care professional if they continue or are bothersome):  dizziness  drowsiness  headache  nausea, vomiting  swelling of ankles, feet, hands  tiredness This list may not describe all possible side effects. Call your doctor for medical advice about side effects. You may report side effects to FDA at 1-800-FDA-1088. Where should I keep my medicine? Keep out of reach of children. This medicine may cause accidental overdose and death if it taken by other adults, children, or pets. Mix any unused medicine with a  substance like cat litter or coffee grounds. Then throw the medicine away in a sealed container like a sealed bag or a coffee can with a lid. Do not use the medicine after the expiration date. Store at room temperature between 15 and 30 degrees C (59 and 86 degrees F). NOTE: This sheet is a summary. It may not cover all possible information. If you have questions about this medicine, talk to your doctor, pharmacist, or health care provider.  2020 Elsevier/Gold Standard (2018-07-21 14:16:43)

## 2019-07-05 NOTE — Progress Notes (Signed)
I agree with the above plan 

## 2019-07-06 MED FILL — ELIQUIS 5 MG TABLET: 5 | 30 days supply | Qty: 60 | Fill #0

## 2019-07-11 NOTE — Telephone Encounter (Signed)
I will complete this this evening thank you

## 2019-07-11 NOTE — Telephone Encounter (Signed)
Wife([name]) is checking on FMLA needing to be faxed in by 3/12

## 2019-07-11 NOTE — Telephone Encounter (Signed)
Please advise. Thank you

## 2019-07-12 ENCOUNTER — Telehealth: Payer: Self-pay | Admitting: Family Medicine

## 2019-07-12 ENCOUNTER — Ambulatory Visit (INDEPENDENT_AMBULATORY_CARE_PROVIDER_SITE_OTHER): Payer: No Typology Code available for payment source | Admitting: Internal Medicine

## 2019-07-12 ENCOUNTER — Other Ambulatory Visit (HOSPITAL_COMMUNITY): Payer: Self-pay | Admitting: Nurse Practitioner

## 2019-07-12 ENCOUNTER — Encounter (HOSPITAL_COMMUNITY): Payer: Self-pay | Admitting: Nurse Practitioner

## 2019-07-12 ENCOUNTER — Encounter: Payer: Self-pay | Admitting: Internal Medicine

## 2019-07-12 ENCOUNTER — Other Ambulatory Visit: Payer: Self-pay

## 2019-07-12 ENCOUNTER — Ambulatory Visit (HOSPITAL_COMMUNITY): Payer: No Typology Code available for payment source | Admitting: Nurse Practitioner

## 2019-07-12 ENCOUNTER — Ambulatory Visit (HOSPITAL_COMMUNITY)
Admission: RE | Admit: 2019-07-12 | Discharge: 2019-07-12 | Disposition: A | Discharge status: Home / Self Care | Payer: No Typology Code available for payment source | Source: Ambulatory Visit | Attending: Nurse Practitioner | Admitting: Nurse Practitioner

## 2019-07-12 VITALS — BP 126/80 | HR 75 | Ht 73.0 in | Wt 189.8 lb

## 2019-07-12 VITALS — BP 112/58 | HR 80 | Temp 97.8°F | Ht 73.0 in | Wt 189.8 lb

## 2019-07-12 DIAGNOSIS — I480 Paroxysmal atrial fibrillation: Secondary | ICD-10-CM | POA: Diagnosis present

## 2019-07-12 DIAGNOSIS — Z7901 Long term (current) use of anticoagulants: Secondary | ICD-10-CM | POA: Diagnosis not present

## 2019-07-12 DIAGNOSIS — Z8673 Personal history of transient ischemic attack (TIA), and cerebral infarction without residual deficits: Secondary | ICD-10-CM | POA: Diagnosis not present

## 2019-07-12 DIAGNOSIS — Z888 Allergy status to other drugs, medicaments and biological substances status: Secondary | ICD-10-CM | POA: Insufficient documentation

## 2019-07-12 DIAGNOSIS — D6869 Other thrombophilia: Secondary | ICD-10-CM

## 2019-07-12 DIAGNOSIS — Z7984 Long term (current) use of oral hypoglycemic drugs: Secondary | ICD-10-CM | POA: Diagnosis not present

## 2019-07-12 DIAGNOSIS — Z87891 Personal history of nicotine dependence: Secondary | ICD-10-CM | POA: Diagnosis not present

## 2019-07-12 DIAGNOSIS — Z794 Long term (current) use of insulin: Secondary | ICD-10-CM | POA: Insufficient documentation

## 2019-07-12 DIAGNOSIS — Z981 Arthrodesis status: Secondary | ICD-10-CM | POA: Diagnosis not present

## 2019-07-12 DIAGNOSIS — K559 Vascular disorder of intestine, unspecified: Secondary | ICD-10-CM | POA: Diagnosis not present

## 2019-07-12 DIAGNOSIS — I10 Essential (primary) hypertension: Secondary | ICD-10-CM | POA: Insufficient documentation

## 2019-07-12 DIAGNOSIS — Z79899 Other long term (current) drug therapy: Secondary | ICD-10-CM | POA: Diagnosis not present

## 2019-07-12 MED ORDER — APIXABAN 5 MG PO TABS: 5.0000 mg | ORAL_TABLET | Freq: Two times a day (BID) | ORAL | 3 refills | Status: DC

## 2019-07-12 NOTE — Progress Notes (Signed)
Primary Care Physician: Kathyrn Drown, MD Referring Physician: Dr. Andee Poles is a 59 y.o. male with a h/o CVA, s/p linq with transient afib noted that was seen by me in 2017 for start of eliquis. I have not seen him since then. He is here today to get refills on his eliquis. He has SR on his EKG and has not noted any irregular HR's. Review of his Paceart  device checks  have shown absence of afib. He is doing well. No bleeding issues. Last cmet  In December showed normal creatinine at 1.03.  F/u in afib clinic, 07/12/19. He is here for f/u afib  s/p Linq insertion after stroke 2017. As far as he is aware, he has had no further afib. He was in the hospital in February with a GI bleed. He was off anticoagulation for a few days  but has resumed. ASA was stopped. No reason for the bleed was identified. No further bleeding. CHA2DS2VASc score is at least 5. His Linq battery has since expired but he prefers to leave in place for now.   Today, he denies symptoms of palpitations, chest pain, shortness of breath, orthopnea, PND, lower extremity edema, dizziness, presyncope, syncope, or neurologic sequela. The patient is tolerating medications without difficulties and is otherwise without complaint today.   Past Medical History:  Diagnosis Date  . A-fib ([place]) 02/2016   found on loop recorder  . Adhesive capsulitis of left shoulder 08/02/2014  . Anxiety   . Arthritis   . Blood transfusion without reported diagnosis   . Chronic headaches   . Complication of anesthesia    bleed after last shoulder-had to stay overnight  . Depression   . Diabetes mellitus   . Diabetic peripheral neuropathy ([place]) 03/28/2013  . Diverticulosis   . Dizziness   . Hernia, inguinal   . Hyperlipidemia   . Hypertension   . Impingement syndrome of left shoulder 08/02/2014  . Ischemic colitis ([place])   . 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 Neuropathy of lower extremity   . Night sweats    every once in a while   . Sleep apnea    no CPAP  . Stroke ([place]) 02/2015  . Syncope and collapse    Past Surgical History:  Procedure Laterality Date  . BIOPSY  06/14/2019   Procedure: BIOPSY;  Surgeon: [name], MD;  Location: [facility];  Service: Gastroenterology;;  . CERVICAL [place]    . COLON SURGERY  2003   1/3 removed for diverticulitis  . COLONOSCOPY    . COLONOSCOPY WITH PROPOFOL N/A 06/14/2019   Procedure: COLONOSCOPY WITH PROPOFOL;  Surgeon: [name], MD;  Location: [facility];  Service: Gastroenterology;  Laterality: N/A;  . EP IMPLANTABLE DEVICE N/A 05/01/2015   Procedure: Loop Recorder Insertion;  Surgeon: [name], MD;  Location: [facility];  Service: Cardiovascular;  Laterality: N/A;  . INGUINAL HERNIA REPAIR Bilateral   . KNEE ARTHROSCOPY Right   . SHOULDER ARTHROSCOPY Right    1  . SHOULDER ARTHROSCOPY Left 08/02/2014   Procedure: LEFT SHOULDER SCOPE DEBRIDEMENT/ACROMIOPLASTY;  Surgeon: [name], MD;  Location: [facility];  Service: Orthopedics;  Laterality: Left;  ANESTHESIA: GENERAL, PRE/POST OP SCALENE  . TEE WITHOUT CARDIOVERSION N/A 03/03/2015   Procedure: TRANSESOPHAGEAL ECHOCARDIOGRAM (TEE);  Surgeon: [name], MD;  Location: [facility];  Service: Cardiology;  Laterality: N/A;  . UMBILICAL HERNIA REPAIR  with other hernia repair with mesh  . WRIST SURGERY Right    fusion    Current Outpatient Medications  Medication Sig Dispense Refill  . ACCU-CHEK FASTCLIX LANCETS MISC 1 Dose by Does not apply route 4 (four) times daily. 100 each 3  . ALPRAZolam (XANAX) 0.25 MG tablet TAKE 1 TABLET BY MOUTH TWO TIMES DAILY AS NEEDED (Patient taking differently: Take 0.25 mg by mouth 2 (two) times daily as needed for anxiety. TAKE 1 TABLET BY MOUTH TWO TIMES DAILY AS NEEDED) 60 tablet 2  . apixaban (ELIQUIS) 5 MG TABS tablet Take 1 tablet (5 mg total) by mouth 2 (two) times daily. 180 tablet 3  . atorvastatin (LIPITOR)  20 MG tablet TAKE 1 TABLET BY MOUTH ONCE DAILY 90 tablet 1  . Blood Glucose Monitoring Suppl (BLOOD GLUCOSE MONITOR SYSTEM) w/Device KIT Test glucose 2 time daily.  Meter/strips/lancets. Please dispense per patient/insurance preferrence 1 each 5  . Cholecalciferol 50 MCG (2000 UT) CAPS Take 1 capsule (2,000 Units total) by mouth daily with breakfast. 30 each 6  . fluticasone (FLONASE) 50 MCG/ACT nasal spray Place 1 spray into both nostrils daily.     . folic acid (FOLVITE) 1 MG tablet TAKE 1 TABLET BY MOUTH DAILY. 90 tablet 1  . gabapentin (NEURONTIN) 300 MG capsule Take 1 capsule (300 mg total) by mouth 3 (three) times daily. 270 capsule 0  . hyoscyamine (LEVSIN SL) 0.125 MG SL tablet Place 1 tablet (0.125 mg total) under the tongue daily before breakfast. 30 tablet 6  . Insulin Glargine (LANTUS SOLOSTAR) 100 UNIT/ML Solostar Pen INJECT 40 UNITS INTO THE SKIN AT BEDTIME. 45 mL 0  . Insulin Pen Needle (PEN NEEDLES) 31G X 6 MM MISC 1 each by Does not apply route daily. 100 each 1  . meclizine (ANTIVERT) 25 MG tablet TAKE 1 TABLET BY MOUTH 3 TIMES DAILY AS NEEDED FOR DIZZINESS (Patient taking differently: Take 25 mg by mouth 3 (three) times daily as needed for dizziness. ) 30 tablet 5  . metFORMIN (GLUCOPHAGE) 500 MG tablet TAKE 1 TABLET BY MOUTH TWO TIMES DAILY 90 tablet 0  . Multiple Vitamin (MULTIVITAMIN WITH MINERALS) TABS tablet Take 1 tablet daily by mouth.    Marland Kitchen UNIFINE PENTIPS 31G X 8 MM MISC USE AS DIRECTED WITH LEVEMIR INJECTIONS DAILY 100 each 0  . valACYclovir (VALTREX) 1000 MG tablet TAKE 1 TABLET BY MOUTH ONCE DAILY 30 tablet 5  . Insulin Glargine (BASAGLAR KWIKPEN) 100 UNIT/ML SOPN Inject 0.4 mLs (40 Units total) into the skin at bedtime. (Patient not taking: Reported on 07/12/2019) 45 mL 0   No current facility-administered medications for this encounter.    Allergies  Allergen Reactions  . Lisinopril Cough    Patient/spouse is not aware/familiar with why this is listed as an  allergy    Social History   Socioeconomic History  . Marital status: Married    Spouse name: Not on file  . Number of children: 2  . Years of education: College  . Highest education level: Not on file  Occupational History  . Occupation: disabled  Tobacco Use  . Smoking status: Former Smoker    Packs/day: 1.00    Years: 8.00    Pack years: 8.00    Types: Cigarettes    Start date: 06/07/1970    Quit date: 05/03/1978    Years since quitting: 41.2  . Smokeless tobacco: Former Systems developer    Types: [place] date: 05/03/1978  Substance and  Sexual Activity  . Alcohol use: No    Alcohol/week: 1.0 standard drinks    Types: 1 Cans of beer per week    Comment: h/o heavy use in the past  . Drug use: No  . Sexual activity: Not on file  Other Topics Concern  . Not on file  Social History Narrative   Drinks some coffee, Drink diet sodas and tea   Social Determinants of Health   Financial Resource Strain:   . Difficulty of Paying Living Expenses:   Food Insecurity:   . Worried About Charity fundraiser in the Last Year:   . Arboriculturist in the Last Year:   Transportation Needs:   . Film/video editor (Medical):   Marland Kitchen Lack of Transportation (Non-Medical):   Physical Activity:   . Days of Exercise per Week:   . Minutes of Exercise per Session:   Stress:   . Feeling of Stress :   Social Connections:   . Frequency of Communication with Friends and Family:   . Frequency of Social Gatherings with Friends and Family:   . Attends Religious Services:   . Active Member of Clubs or Organizations:   . Attends Archivist Meetings:   Marland Kitchen Marital Status:   Intimate Partner Violence:   . Fear of Current or Ex-Partner:   . Emotionally Abused:   Marland Kitchen Physically Abused:   . Sexually Abused:     Family History  Problem Relation Age of Onset  . Stroke Father   . Hyperlipidemia Father   . Heart attack Sister 54  . Stroke Sister   . Dementia Mother   . ALS Brother        age 5   . Diabetes Maternal Grandfather   . Colon cancer Neg Hx   . Esophageal cancer Neg Hx   . Stomach cancer Neg Hx   . Rectal cancer Neg Hx     ROS- All systems are reviewed and negative except as per the HPI above  Physical Exam: Vitals:   07/12/19 1130  BP: 126/80  Pulse: 75  Weight: 86.1 kg  Height: '6\' 1"'  (1.854 m)   Wt Readings from Last 3 Encounters:  07/12/19 86.1 kg  07/12/19 86.1 kg  07/05/19 84.8 kg    Labs: Lab Results  Component Value Date   NA 142 06/26/2019   K 4.4 06/26/2019   CL 107 (H) 06/26/2019   CO2 23 06/26/2019   GLUCOSE 85 06/26/2019   BUN 15 06/26/2019   CREATININE 0.96 06/26/2019   CALCIUM 9.4 06/26/2019   PHOS 3.2 06/13/2019   MG 2.0 06/13/2019   Lab Results  Component Value Date   INR 1.15 05/02/2016   Lab Results  Component Value Date   CHOL 95 (L) 12/08/2018   HDL 34 (L) 12/08/2018   LDLCALC 43 12/08/2018   TRIG 91 12/08/2018     GEN- The patient is well appearing, alert and oriented x 3 today.   Head- normocephalic, atraumatic Eyes-  Sclera clear, conjunctiva pink Ears- hearing intact Oropharynx- clear Neck- supple, no JVP Lymph- no cervical lymphadenopathy Lungs- Clear to ausculation bilaterally, normal work of breathing Heart- Regular rate and rhythm, no murmurs, rubs or gallops, PMI not laterally displaced GI- soft, NT, ND, + BS Extremities- no clubbing, cyanosis, or edema MS- no significant deformity or atrophy Skin- no rash or lesion Psych- euthymic mood, full affect Neuro- strength and sensation are intact  EKG-NSR at 78 bpm, PR int 144  ms, qrs int 92 ms, qtc 410 ms Epic records reviewed  Epic records reviewed  Assessment and Plan: 1. Paroxysmal afib Seen on LInq after stroke 2017 Now with expired battery, does not want replaced or removed at this point afib appears to be quiet  Continue eliquis 5 mg bid for a CHA2DS2VASc score of at least 5, refills sent in  Appropriately dosed    2. HTN Stable   F/u  in one year  Geroge Baseman. Hadassah Rana, [facility] [address] [place], [place] [address] [phone]

## 2019-07-12 NOTE — Telephone Encounter (Signed)
Travis [name] FMLA paperwork filled out as best as I could It has been signed off on and available for pickup in my office thank you in the

## 2019-07-12 NOTE — Patient Instructions (Signed)
Please follow up as needed 

## 2019-07-12 NOTE — Progress Notes (Signed)
HISTORY OF PRESENT ILLNESS:  Travis [name] is a 59 y.o. male with multiple medical problems as listed below including atrial fibrillation, hypertension, hyperlipidemia, and remote history of complicated diverticulitis status post sigmoid colon resection.  Last saw the patient in 2017 for complaints of rectal bleeding.  At that time he underwent colonoscopy on both aspirin and Plavix.  He was found to have hemorrhoids.  No other abnormalities.  About 4 weeks ago he presented to the hospital with acute abdominal pain and rectal bleeding.  CT scan performed the day prior to evaluate altered bowel habits with loose stools and low back pain was unremarkable.  Colonoscopy was performed 1 day after admission on June 14, 2019.  He was found to have severe diffuse inflammation involving the region of the transverse colon to the cecum.  This was felt to be most consistent with ischemic colitis.  Biopsies were consistent with the same.  He was treated with supportive care and antibiotics.  He was discharged home June 16, 2019.  Since that time he has done well.  His abdominal pain has resolved.  No further rectal bleeding.  Posthospitalization blood work obtained June 26, 2019 reveals unremarkable comprehensive metabolic panel.  Normal CBC with white blood cell count 7.1 and hemoglobin 14.4.  He is accompanied today by his wife.  His only complaint is having several bowel movements each morning and occasional urgency when out and about.  This is chronic.  REVIEW OF SYSTEMS:  All non-GI ROS negative unless otherwise stated in the HPI except for anxiety, arthritis  Past Medical History:  Diagnosis Date  . A-fib ([place]) 02/2016   found on loop recorder  . 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 Anxiety   . Arthritis   . Blood transfusion without reported diagnosis   . Chronic headaches   . Complication of anesthesia    bleed after last shoulder-had to stay overnight  . Depression   .  Diabetes mellitus   . Diabetic peripheral neuropathy ([place]) 03/28/2013  . Diverticulosis   . Dizziness   . Hernia, inguinal   . Hyperlipidemia   . Hypertension   . Impingement syndrome of left shoulder 08/02/2014  . Ischemic colitis ([place])   . 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 Neuropathy of lower extremity   . Night sweats    every once in a while  . Sleep apnea    no CPAP  . Stroke ([place]) 02/2015  . Syncope and collapse     Past Surgical History:  Procedure Laterality Date  . BIOPSY  06/14/2019   Procedure: BIOPSY;  Surgeon: [name], MD;  Location: [facility];  Service: Gastroenterology;;  . CERVICAL [place]    . COLON SURGERY  2003   1/3 removed for diverticulitis  . COLONOSCOPY    . COLONOSCOPY WITH PROPOFOL N/A 06/14/2019   Procedure: COLONOSCOPY WITH PROPOFOL;  Surgeon: [name], MD;  Location: [facility];  Service: Gastroenterology;  Laterality: N/A;  . EP IMPLANTABLE DEVICE N/A 05/01/2015   Procedure: Loop Recorder Insertion;  Surgeon: [name], MD;  Location: [facility];  Service: Cardiovascular;  Laterality: N/A;  . INGUINAL HERNIA REPAIR Bilateral   . KNEE ARTHROSCOPY Right   . SHOULDER ARTHROSCOPY Right    1  . SHOULDER ARTHROSCOPY Left 08/02/2014   Procedure: LEFT SHOULDER SCOPE DEBRIDEMENT/ACROMIOPLASTY;  Surgeon: [name], MD;  Location: [facility];  Service: Orthopedics;  Laterality: Left;  ANESTHESIA: GENERAL, PRE/POST  OP SCALENE  . TEE WITHOUT CARDIOVERSION N/A 03/03/2015   Procedure: TRANSESOPHAGEAL ECHOCARDIOGRAM (TEE);  Surgeon: [name], MD;  Location: [facility];  Service: Cardiology;  Laterality: N/A;  . UMBILICAL HERNIA REPAIR     with other hernia repair with mesh  . WRIST SURGERY Right    fusion    Social History JANIS LOACH  reports that he quit smoking about 41 years ago. His smoking use included cigarettes. He started smoking about 49 years ago. He has a 8.00 pack-year  smoking history. He quit smokeless tobacco use about 41 years ago.  His smokeless tobacco use included chew. He reports that he does not drink alcohol or use drugs.  family history includes ALS in his brother; Dementia in his mother; Diabetes in his maternal grandfather; Heart attack (age of onset: 54) in his sister; Hyperlipidemia in his father; Stroke in his father and sister.  Allergies  Allergen Reactions  . Lisinopril Cough    Patient/spouse is not aware/familiar with why this is listed as an allergy       PHYSICAL EXAMINATION: Vital signs: BP (!) 112/58   Pulse 80   Temp 97.8 F (36.6 C)   Ht 6\' 1"  (1.854 m)   Wt 189 lb 12.8 oz (86.1 kg)   BMI 25.04 kg/m   Constitutional: generally well-appearing, no acute distress Psychiatric: alert and oriented x3, cooperative Eyes: extraocular movements intact, anicteric, conjunctiva pink Mouth: oral pharynx moist, no lesions Neck: supple no lymphadenopathy Cardiovascular: heart regular rate and rhythm, no murmur Lungs: clear to auscultation bilaterally Abdomen: soft, nontender, nondistended, no obvious ascites, no peritoneal signs, normal bowel sounds, no organomegaly.  Prior surgical incision is well-healed Rectal: Omitted Extremities: no clubbing, cyanosis, or lower extremity edema bilaterally Skin: no lesions on visible extremities Neuro: No focal deficits.  Cranial nerves intact  ASSESSMENT:  1.  Recent hospitalization with acute ischemic colitis (right-sided) in a gentleman status post remote sigmoid colon resection for diverticulitis.  He has had complete clinical resolution. 2.  Defecation urgency.  Chronic and stable.   PLAN:  1.  Discussed ischemic colitis in detail. 2.  Expectant management 3.  Bulk fiber 4.  Resume general medical care with PCP.  GI follow-up as needed A total time of 30 minutes was spent preparing to see the patient, reviewing laboratories, x-rays, endoscopy report, and pathology.  Also obtaining  and reviewing separately obtained histories, performing comprehensive physical exam, counseling the patient regarding his above listed issues, and documented clinical information in the health record

## 2019-07-19 MED FILL — valACYclovir HCL 1 GM TABS: 1 | 30 days supply | Qty: 30 | Fill #1

## 2019-07-21 ENCOUNTER — Ambulatory Visit: Payer: No Typology Code available for payment source | Attending: Internal Medicine

## 2019-07-21 DIAGNOSIS — Z23 Encounter for immunization: Secondary | ICD-10-CM

## 2019-07-21 NOTE — Progress Notes (Signed)
   Covid-19 Vaccination Clinic  Name:  Travis [name]    MRN: [id number] DOB: November 23, 1960  07/21/2019  [name] was observed post Covid-19 immunization for 15 minutes without incident. He was provided with Vaccine Information Sheet and instruction to access the V-Safe system.   [name] was instructed to call 911 with any severe reactions post vaccine: Marland Kitchen Difficulty breathing  . Swelling of face and throat  . A fast heartbeat  . A bad rash all over body  . Dizziness and weakness   Immunizations Administered    Name Date Dose VIS Date Route   Moderna COVID-19 Vaccine 07/21/2019 11:55 AM 0.5 mL 04/03/2019 Intramuscular   Manufacturer: Moderna   Lot: [id number]   [id number]

## 2019-07-27 ENCOUNTER — Other Ambulatory Visit: Payer: Self-pay | Admitting: "Endocrinology

## 2019-07-27 MED ORDER — BASAGLAR KWIKPEN 100 UNIT/ML ~~LOC~~ SOPN: 40.0000 [IU] | PEN_INJECTOR | Freq: Every day | SUBCUTANEOUS | 2 refills | Status: DC

## 2019-08-04 MED FILL — BASAGLAR 100 UNIT/ML KWIKPE: 100 | 37 days supply | Qty: 15 | Fill #0

## 2019-08-07 ENCOUNTER — Other Ambulatory Visit: Payer: Self-pay | Admitting: Family Medicine

## 2019-08-07 MED FILL — OSCIMIN SL 0.125 MG TABLET: 0.125 | 30 days supply | Qty: 30 | Fill #2

## 2019-08-07 MED FILL — MECLIZINE 25 MG TABLET: 25 | 10 days supply | Qty: 30 | Fill #2

## 2019-08-07 MED FILL — ELIQUIS 5 MG TABLET: 5 | 30 days supply | Qty: 60 | Fill #1

## 2019-08-07 MED FILL — ALPRAZolam 0.25 MG TABS: 0.25 | 30 days supply | Qty: 60 | Fill #2

## 2019-08-09 MED FILL — ATORVASTATIN 20 MG TABLET: 20 | 90 days supply | Qty: 90 | Fill #1

## 2019-08-20 ENCOUNTER — Other Ambulatory Visit: Payer: Self-pay | Admitting: "Endocrinology

## 2019-08-21 ENCOUNTER — Other Ambulatory Visit: Payer: Self-pay

## 2019-08-21 DIAGNOSIS — D3502 Benign neoplasm of left adrenal gland: Secondary | ICD-10-CM

## 2019-08-22 ENCOUNTER — Ambulatory Visit: Payer: No Typology Code available for payment source | Admitting: Nutrition

## 2019-08-22 ENCOUNTER — Ambulatory Visit: Payer: No Typology Code available for payment source | Attending: Internal Medicine

## 2019-08-22 ENCOUNTER — Ambulatory Visit: Payer: No Typology Code available for payment source | Admitting: "Endocrinology

## 2019-08-22 DIAGNOSIS — Z23 Encounter for immunization: Secondary | ICD-10-CM

## 2019-08-22 NOTE — Progress Notes (Signed)
   Covid-19 Vaccination Clinic  Name:  Travis [name]    MRN: [id number] DOB: 31-Aug-1960  08/22/2019  [name] was observed post Covid-19 immunization for 15 minutes without incident. He was provided with Vaccine Information Sheet and instruction to access the V-Safe system.   [name] was instructed to call 911 with any severe reactions post vaccine: Marland Kitchen Difficulty breathing  . Swelling of face and throat  . A fast heartbeat  . A bad rash all over body  . Dizziness and weakness   Immunizations Administered    Name Date Dose VIS Date Route   Moderna COVID-19 Vaccine 08/22/2019 12:23 PM 0.5 mL 04/2019 Intramuscular   Manufacturer: Moderna   Lot: [id number]   [id number]

## 2019-08-24 ENCOUNTER — Other Ambulatory Visit: Payer: Self-pay | Admitting: Family Medicine

## 2019-08-24 MED FILL — FOLIC ACID 1 MG TABS: 1 | 90 days supply | Qty: 90 | Fill #0

## 2019-08-24 MED FILL — valACYclovir HCL 1 GM TABS: 1 | 30 days supply | Qty: 30 | Fill #2

## 2019-08-28 LAB — METANEPHRINES, PLASMA
Metanephrine, Free: 14.3 pg/mL (ref 0.0–88.0)
Normetanephrine, Free: 56.8 pg/mL (ref 0.0–191.8)

## 2019-08-29 LAB — CORTISOL, URINE, FREE
Cortisol (Ur), Free: 22 ug/24 hr (ref 5–64)
Cortisol,F,ug/L,U: 14 ug/L

## 2019-08-29 LAB — CREATININE, URINE, 24 HOUR
Creatinine, 24H Ur: 1526 mg/24 hr (ref 1000–2000)
Creatinine, Urine: 95.4 mg/dL

## 2019-08-29 LAB — ALDOSTERONE, URINE
Aldosterone U,Random: <2.5 ug/L
Aldosterone, 24H Ur: <4 ug/24 hr (ref 0.00–19.00)

## 2019-09-07 ENCOUNTER — Ambulatory Visit (INDEPENDENT_AMBULATORY_CARE_PROVIDER_SITE_OTHER): Payer: No Typology Code available for payment source | Admitting: "Endocrinology

## 2019-09-07 ENCOUNTER — Other Ambulatory Visit: Payer: Self-pay

## 2019-09-07 ENCOUNTER — Encounter: Payer: Self-pay | Admitting: "Endocrinology

## 2019-09-07 ENCOUNTER — Encounter: Payer: Self-pay | Admitting: Family Medicine

## 2019-09-07 VITALS — BP 132/84 | HR 73 | Ht 73.0 in | Wt 189.0 lb

## 2019-09-07 DIAGNOSIS — E1159 Type 2 diabetes mellitus with other circulatory complications: Secondary | ICD-10-CM | POA: Diagnosis not present

## 2019-09-07 DIAGNOSIS — D3502 Benign neoplasm of left adrenal gland: Secondary | ICD-10-CM | POA: Diagnosis not present

## 2019-09-07 DIAGNOSIS — E782 Mixed hyperlipidemia: Secondary | ICD-10-CM | POA: Diagnosis not present

## 2019-09-07 LAB — HEMOGLOBIN A1C: Hemoglobin A1C: 8.6

## 2019-09-07 MED ORDER — BASAGLAR KWIKPEN 100 UNIT/ML ~~LOC~~ SOPN: 50.0000 [IU] | PEN_INJECTOR | Freq: Every day | SUBCUTANEOUS | 2 refills | Status: DC

## 2019-09-07 MED FILL — BASAGLAR 100 UNIT/ML KWIKPE: 100 | 30 days supply | Qty: 15 | Fill #0

## 2019-09-07 NOTE — Progress Notes (Signed)
09/07/2019, 12:15 PM              Endocrinology follow-up note    Subjective:    Patient ID: Travis [name], male    DOB: [birth year].  Travis [name] is being seen in follow-up  for management of currently uncontrolled symptomatic diabetes requested by  Kathyrn Drown, MD.   Past Medical History:  Diagnosis Date  . A-fib ([place]) 02/2016   found on loop recorder  . 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 Anxiety   . Arthritis   . Blood transfusion without reported diagnosis   . Chronic headaches   . Complication of anesthesia    bleed after last shoulder-had to stay overnight  . Depression   . Diabetes mellitus   . Diabetic peripheral neuropathy ([place]) 03/28/2013  . Diverticulosis   . Dizziness   . Hernia, inguinal   . Hyperlipidemia   . Hypertension   . Impingement syndrome of left shoulder 08/02/2014  . Ischemic colitis ([place])   . 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 Neuropathy of lower extremity   . Night sweats    every once in a while  . Sleep apnea    no CPAP  . Stroke ([place]) 02/2015  . Syncope and collapse    Past Surgical History:  Procedure Laterality Date  . BIOPSY  06/14/2019   Procedure: BIOPSY;  Surgeon: [name], MD;  Location: [facility];  Service: Gastroenterology;;  . CERVICAL [place]    . COLON SURGERY  2003   1/3 removed for diverticulitis  . COLONOSCOPY    . COLONOSCOPY WITH PROPOFOL N/A 06/14/2019   Procedure: COLONOSCOPY WITH PROPOFOL;  Surgeon: [name], MD;  Location: [facility];  Service: Gastroenterology;  Laterality: N/A;  . EP IMPLANTABLE DEVICE N/A 05/01/2015   Procedure: Loop Recorder Insertion;  Surgeon: [name], MD;  Location: [facility];  Service: Cardiovascular;  Laterality: N/A;  . INGUINAL HERNIA REPAIR Bilateral   . KNEE ARTHROSCOPY Right   . SHOULDER ARTHROSCOPY Right    1  . SHOULDER ARTHROSCOPY Left 08/02/2014    Procedure: LEFT SHOULDER SCOPE DEBRIDEMENT/ACROMIOPLASTY;  Surgeon: [name], MD;  Location: [facility];  Service: Orthopedics;  Laterality: Left;  ANESTHESIA: GENERAL, PRE/POST OP SCALENE  . TEE WITHOUT CARDIOVERSION N/A 03/03/2015   Procedure: TRANSESOPHAGEAL ECHOCARDIOGRAM (TEE);  Surgeon: [name], MD;  Location: [facility];  Service: Cardiology;  Laterality: N/A;  . UMBILICAL HERNIA REPAIR     with other hernia repair with mesh  . WRIST SURGERY Right    fusion   Social History   Socioeconomic History  . Marital status: Married    Spouse name: Not on file  . Number of children: 2  . Years of education: College  . Highest education level: Not on file  Occupational History  . Occupation: disabled  Tobacco Use  . Smoking status: Former Smoker    Packs/day: 1.00    Years: 8.00    Pack years: 8.00    Types: Cigarettes    Start date: 06/07/1970    Quit date: 05/03/1978    Years since quitting: 41.3  . Smokeless tobacco: Former Systems developer  Types: Sarina Ser    Quit date: 05/03/1978  Substance and Sexual Activity  . Alcohol use: No    Alcohol/week: 1.0 standard drinks    Types: 1 Cans of beer per week    Comment: h/o heavy use in the past  . Drug use: No  . Sexual activity: Not on file  Other Topics Concern  . Not on file  Social History Narrative   Drinks some coffee, Drink diet sodas and tea   Social Determinants of Health   Financial Resource Strain:   . Difficulty of Paying Living Expenses:   Food Insecurity:   . Worried About Charity fundraiser in the Last Year:   . Arboriculturist in the Last Year:   Transportation Needs:   . Film/video editor (Medical):   Travis [name] Kitchen Lack of Transportation (Non-Medical):   Physical Activity:   . Days of Exercise per Week:   . Minutes of Exercise per Session:   Stress:   . Feeling of Stress :   Social Connections:   . Frequency of Communication with Friends and Family:   . Frequency of Social Gatherings with  Friends and Family:   . Attends Religious Services:   . Active Member of Clubs or Organizations:   . Attends Archivist Meetings:   Travis [name] Kitchen Marital Status:    Outpatient Encounter Medications as of 09/07/2019  Medication Sig  . ACCU-CHEK FASTCLIX LANCETS MISC 1 Dose by Does not apply route 4 (four) times daily.  Travis [name] Kitchen ALPRAZolam (XANAX) 0.25 MG tablet TAKE 1 TABLET BY MOUTH TWO TIMES DAILY AS NEEDED  . apixaban (ELIQUIS) 5 MG TABS tablet Take 1 tablet (5 mg total) by mouth 2 (two) times daily.  Travis [name] Kitchen atorvastatin (LIPITOR) 20 MG tablet TAKE 1 TABLET BY MOUTH ONCE DAILY  . Blood Glucose Monitoring Suppl (BLOOD GLUCOSE MONITOR SYSTEM) w/Device KIT Test glucose 2 time daily.  Meter/strips/lancets. Please dispense per patient/insurance preferrence  . Cholecalciferol 50 MCG (2000 UT) CAPS Take 1 capsule (2,000 Units total) by mouth daily with breakfast.  . fluticasone (FLONASE) 50 MCG/ACT nasal spray Place 1 spray into both nostrils daily.   . folic acid (FOLVITE) 1 MG tablet TAKE 1 TABLET BY MOUTH DAILY.  Travis [name] Kitchen gabapentin (NEURONTIN) 300 MG capsule Take 1 capsule (300 mg total) by mouth 3 (three) times daily.  . hyoscyamine (LEVSIN SL) 0.125 MG SL tablet Place 1 tablet (0.125 mg total) under the tongue daily before breakfast.  . Insulin Glargine (BASAGLAR KWIKPEN) 100 UNIT/ML Inject 0.5 mLs (50 Units total) into the skin at bedtime.  . Insulin Pen Needle (PEN NEEDLES) 31G X 6 MM MISC 1 each by Does not apply route daily.  . meclizine (ANTIVERT) 25 MG tablet TAKE 1 TABLET BY MOUTH 3 TIMES DAILY AS NEEDED FOR DIZZINESS (Patient taking differently: Take 25 mg by mouth 3 (three) times daily as needed for dizziness. )  . metFORMIN (GLUCOPHAGE) 500 MG tablet TAKE 1 TABLET BY MOUTH TWO TIMES DAILY  . Multiple Vitamin (MULTIVITAMIN WITH MINERALS) TABS tablet Take 1 tablet daily by mouth.  Travis [name] Kitchen UNIFINE PENTIPS 31G X 8 MM MISC USE AS DIRECTED WITH LEVEMIR INJECTIONS DAILY  . valACYclovir (VALTREX) 1000 MG tablet TAKE 1  TABLET BY MOUTH ONCE DAILY  . [DISCONTINUED] Insulin Glargine (BASAGLAR KWIKPEN) 100 UNIT/ML Inject 0.4 mLs (40 Units total) into the skin at bedtime.   No facility-administered encounter medications on file as of 09/07/2019.    ALLERGIES: Allergies  Allergen Reactions  . Lisinopril  Cough    Patient/spouse is not aware/familiar with why this is listed as an allergy    VACCINATION STATUS: Immunization History  Administered Date(s) Administered  . Influenza Split 03/28/2013  . Influenza,inj,Quad PF,6+ Mos 02/18/2014, 01/23/2015, 02/06/2016, 02/23/2017, 02/24/2018  . Influenza-Unspecified 01/02/2011, 02/09/2019  . Moderna SARS-COVID-2 Vaccination 07/21/2019, 08/22/2019  . Pneumococcal Polysaccharide-23 01/31/2010, 03/01/2015  . Tdap 02/18/2014  . Zoster Recombinat (Shingrix) 04/19/2019, 06/29/2019    Diabetes He presents for his follow-up diabetic visit. He has type 2 diabetes mellitus. Onset time: He was diagnosed at approximate age of 89 years. His disease course has been worsening (He has prior history of heavy alcohol use/abuse.). There are no hypoglycemic associated symptoms. Pertinent negatives for hypoglycemia include no confusion, headaches, pallor or seizures. Pertinent negatives for diabetes include no chest pain, no fatigue, no polydipsia, no polyphagia, no polyuria and no weakness. There are no hypoglycemic complications. Symptoms are worsening. Diabetic complications include a CVA. Risk factors for coronary artery disease include dyslipidemia, diabetes mellitus, hypertension, family history, male sex and tobacco exposure. Current diabetic treatment includes insulin injections and oral agent (monotherapy). He is compliant with treatment most of the time. His weight is increasing steadily. He is following a generally unhealthy diet. When asked about meal planning, he reported none. He has had a previous visit with a dietitian. He participates in exercise intermittently. His home blood  glucose trend is increasing steadily. (He returns with a meter showing average blood glucose of 168 in the last 30 days.  He did not monitor blood glucose in the last 7 days.  His point-of-care A1c is 8.6%, increasing from 8% during his last visit.   ) An ACE inhibitor/angiotensin II receptor blocker is contraindicated.  Hyperlipidemia This is a chronic problem. The current episode started more than 1 year ago. The problem is controlled. Recent lipid tests were reviewed and are normal. Exacerbating diseases include diabetes. Factors aggravating his hyperlipidemia include smoking. Pertinent negatives include no chest pain, myalgias or shortness of breath. Current antihyperlipidemic treatment includes statins. The current treatment provides moderate improvement of lipids. Risk factors for coronary artery disease include dyslipidemia, diabetes mellitus, hypertension, male sex, family history and a sedentary lifestyle.  Hypertension This is a chronic problem. The current episode started more than 1 year ago. The problem is controlled. Pertinent negatives include no chest pain, headaches, neck pain, palpitations or shortness of breath. Risk factors for coronary artery disease include dyslipidemia, diabetes mellitus, family history, male gender, sedentary lifestyle and smoking/tobacco exposure. Hypertensive end-organ damage includes CVA.    Review of Systems  Constitutional: Negative for chills, fatigue, fever and unexpected weight change.  HENT: Negative for dental problem, mouth sores and trouble swallowing.   Eyes: Negative for visual disturbance.  Respiratory: Negative for cough, choking, chest tightness, shortness of breath and wheezing.   Cardiovascular: Negative for chest pain, palpitations and leg swelling.  Gastrointestinal: Negative for abdominal distention, abdominal pain, constipation, diarrhea, nausea and vomiting.  Endocrine: Negative for polydipsia, polyphagia and polyuria.  Genitourinary:  Negative for dysuria, flank pain, hematuria and urgency.  Musculoskeletal: Negative for back pain, gait problem, myalgias and neck pain.  Skin: Negative for pallor, rash and wound.  Neurological: Negative for seizures, syncope, weakness, numbness and headaches.  Psychiatric/Behavioral: Negative.  Negative for confusion and dysphoric mood.    Objective:    BP 132/84   Pulse 73   Ht '6\' 1"'  (1.854 m)   Wt 189 lb (85.7 kg)   BMI 24.94 kg/m   Wt Readings  from Last 3 Encounters:  09/07/19 189 lb (85.7 kg)  07/12/19 189 lb 12.8 oz (86.1 kg)  07/12/19 189 lb 12.8 oz (86.1 kg)      Physical Exam- Limited  Constitutional:  Body mass index is 24.94 kg/m. , not in acute distress, normal state of mind Eyes:  EOMI, no exophthalmos Neck: Supple Thyroid: No gross goiter Respiratory: Adequate breathing efforts Musculoskeletal: no gross deformities, strength intact in all four extremities, no gross restriction of joint movements Skin:  no rashes, no hyperemia Neurological: no tremor with outstretched hands,    CMP     Component Value Date/Time   NA 142 06/26/2019 1547   K 4.4 06/26/2019 1547   CL 107 (H) 06/26/2019 1547   CO2 23 06/26/2019 1547   GLUCOSE 85 06/26/2019 1547   GLUCOSE 217 (H) 06/16/2019 0520   BUN 15 06/26/2019 1547   CREATININE 0.96 06/26/2019 1547   CREATININE 0.82 02/16/2014 1053   CALCIUM 9.4 06/26/2019 1547   PROT 6.7 06/14/2019 0525   PROT 6.6 12/08/2018 1023   ALBUMIN 3.8 06/14/2019 0525   ALBUMIN 4.3 12/08/2018 1023   AST 15 06/14/2019 0525   ALT 11 06/14/2019 0525   ALKPHOS 65 06/14/2019 0525   BILITOT 1.5 (H) 06/14/2019 0525   BILITOT 0.4 12/08/2018 1023   GFRNONAA 85 06/26/2019 1547   GFRAA 98 06/26/2019 1547     Diabetic Labs (most recent): Lab Results  Component Value Date   HGBA1C 8.6 09/07/2019   HGBA1C 8.0 (H) 06/14/2019   HGBA1C 7.8 (A) 04/18/2019     Lipid Panel ( most recent) Lipid Panel     Component Value Date/Time   CHOL 95  (L) 12/08/2018 1023   TRIG 91 12/08/2018 1023   HDL 34 (L) 12/08/2018 1023   CHOLHDL 2.8 12/08/2018 1023   CHOLHDL 3.2 05/03/2016 0545   VLDL 16 05/03/2016 0545   LDLCALC 43 12/08/2018 1023      Lab Results  Component Value Date   TSH 1.960 03/12/2017   TSH 1.460 10/07/2016   TSH 0.720 05/02/2016   TSH 1.895 10/14/2014   FREET4 1.21 10/07/2016      Assessment & Plan:   1. DM type 2 causing vascular disease ([place]) - Travis [name] has currently uncontrolled symptomatic type 2 DM since  59 years of age. He reports near target fasting glycemic profile and slightly above target postprandial blood glucose readings.    -He returns with a meter showing average blood glucose of 168 in the last 30 days.  He did not monitor blood glucose in the last 7 days.  His point-of-care A1c is 8.6%, increasing from 8% during his last visit, after overall improving from 14.6%.   -his diabetes is complicated by CVA, history of smoking, history of heavy alcohol use/abuse in the past and Travis [name] remains at a high risk for more acute and chronic complications which include CAD, CVA, CKD, retinopathy, and neuropathy. These are all discussed in detail with the patient.  - I have counseled him on diet management  by adopting a carbohydrate restricted/protein rich diet.  - he  admits there is a room for improvement in his diet and drink choices. -  Suggestion is made for him to avoid simple carbohydrates  from his diet including Cakes, Sweet Desserts / Pastries, Ice Cream, Soda (diet and regular), Sweet Tea, Candies, Chips, Cookies, Sweet Pastries,  Store Bought Juices, Alcohol in Excess of  1-2 drinks a day, Artificial Sweeteners, Coffee Creamer,  and "Sugar-free" Products. This will help patient to have stable blood glucose profile and potentially avoid unintended weight gain.    - I encouraged him to switch to  unprocessed or minimally processed complex starch and increased protein intake (animal  or plant source), fruits, and vegetables.  - he is advised to stick to a routine mealtimes to eat 3 meals  a day and avoid unnecessary snacks ( to snack only to correct hypoglycemia).   - He is already following with Travis [name], CDE.  - I have approached him with the following individualized plan to manage diabetes and patient agrees:   - There could be a component of pancreatic endocrine and exocrine insufficiency given his prior history of heavy alcohol use. -This makes it likely that he will need multiple daily injections of insulin to control diabetes in the near future.  -#1 priority in this patient is to avoid hypoglycemia.   -He is advised to increase his Lantus to 50 units nightly, associated with  strict monitoring of blood glucose 2 times a day before breakfast and at bedtime.  -Patient is encouraged to call clinic for blood glucose levels less than 70 or above 300 mg /dl. -He has normal renal function, he  will continue to benefit from low-dose Metformin.  He is advised to continue Metformin 500 mg p.o.  twice a day with meals, therapeutically suitable for patient .  -Patient is not a candidate for SGLT2 inhibitors, nor incretin therapy due to his body habitus.  - Patient specific target  A1c;  LDL, HDL, Triglycerides, and  Waist Circumference were discussed in detail.  2) BP/HTN:  His records indicate intolerance to lisinopril.  His blood pressure is controlled to target.     3) Lipids/HPL: His recent lipid panel showed controlled  LDL at 40.  He is advised to continue atorvastatin 20 mg p.o. nightly.  Side effects and precautions discussed with him.    4) left adrenal adenoma-during his recent hospitalization for GI bleed from ischemic colitis a CT scan showed incidental finding of 2.1 cm left adrenal adenoma.  Subsequent plasma metanephrines were within normal limits, considered nonfunctional.  She would not need intervention for it at this time.  Will need repeat labs with  24-hour measurement of cortisol, metanephrines, and catecholamines after his next visit.    5)  Weight/Diet: His BMI is 24.9.  He is not a weight loss candidate.  CDE Consult  is in progress , exercise, and detailed carbohydrates information provided.  If he experiences unintentional weight loss, he will be considered for Creon therapy.   6) Chronic Care/Health Maintenance:  -he  is on Statin medications and  is encouraged to continue to follow up with Ophthalmology, Dentist,  Podiatrist at least yearly or according to recommendations, and advised to  stay away from smoking. I have recommended yearly flu vaccine and pneumonia vaccination at least every 5 years; moderate intensity exercise for up to 150 minutes weekly; and  sleep for at least 7 hours a day.  - I advised patient to maintain close follow up with Kathyrn Drown, MD for primary care needs.  - Time spent on this patient care encounter:  35 min, of which > 50% was spent in  counseling and the rest reviewing his blood glucose logs , discussing his hypoglycemia and hyperglycemia episodes, reviewing his current and  previous labs / studies  ( including abstraction from other facilities) and medications  doses and developing a  long term treatment  plan and documenting his care.   Please refer to Patient Instructions for Blood Glucose Monitoring and Insulin/Medications Dosing Guide"  in media tab for additional information. Please  also refer to " Patient Self Inventory" in the Media  tab for reviewed elements of pertinent patient history.  Travis [name] participated in the discussions, expressed understanding, and voiced agreement with the above plans.  All questions were answered to his satisfaction. he is encouraged to contact clinic should he have any questions or concerns prior to his return visit.    Follow up plan: - Return in about 3 months (around 12/08/2019) for Bring Meter and Logs- A1c in Office, Follow up with Pre-visit  Labs.  Glade Lloyd, MD [facility] [address] [place], [place] [address] Phone: [phone]  Fax: [phone]    09/07/2019, 12:15 PM  This note was partially dictated with voice recognition software. Similar sounding words can be transcribed inadequately or may not  be corrected upon review.

## 2019-09-17 ENCOUNTER — Encounter: Payer: Self-pay | Admitting: Family Medicine

## 2019-09-17 ENCOUNTER — Other Ambulatory Visit: Payer: Self-pay

## 2019-09-17 ENCOUNTER — Ambulatory Visit (INDEPENDENT_AMBULATORY_CARE_PROVIDER_SITE_OTHER): Payer: No Typology Code available for payment source | Admitting: Family Medicine

## 2019-09-17 VITALS — BP 148/88 | Temp 97.5°F | Wt 193.0 lb

## 2019-09-17 DIAGNOSIS — M7711 Lateral epicondylitis, right elbow: Secondary | ICD-10-CM

## 2019-09-17 DIAGNOSIS — R21 Rash and other nonspecific skin eruption: Secondary | ICD-10-CM | POA: Diagnosis not present

## 2019-09-17 DIAGNOSIS — W57XXXA Bitten or stung by nonvenomous insect and other nonvenomous arthropods, initial encounter: Secondary | ICD-10-CM

## 2019-09-17 MED ORDER — DOXYCYCLINE HYCLATE 100 MG PO TABS: 200.0000 mg | ORAL_TABLET | Freq: Once | ORAL | 0 refills | Status: AC

## 2019-09-17 MED ORDER — DOXYCYCLINE HYCLATE 100 MG PO TABS
200.0000 mg | ORAL_TABLET | Freq: Once | ORAL | 0 refills | Status: DC
Start: 2019-09-17 — End: 2019-09-17

## 2019-09-17 NOTE — Progress Notes (Signed)
Patient ID: Travis [name], male    DOB: 07-06-1960, 59 y.o.   MRN: [id number]   Chief Complaint  Patient presents with  . Tick Removal   Subjective:    Patient comes in today to have a spot checked on the back of his right knee checked. Patient states he was bit by something yesterday and has used alcohol on the area. No pain/itching. Spoke with wife and concerned that it might be the "bull's eye rash." in office the back of knee has 3 small red papules. No bull's eye rash. Didn't see a tick, scratched his leg with pants on with a shovel and his hand and never saw a bug/insect yesterday. No fever or bodyaches.   Patient also has concerns of burning pain and weakness in his right arm x 2 weeks.  On outside of the rt arm.  Has had this before and also h/o chronic pain in rt wrist.  Pt stating tylenol or tramadol don't work for his pain.  Has used a tennis elbow brace in past.    Pt brought picture on phone of tick bite, small area of redness encircling the insect bite.  The leg today looking improved with 3 small papules, no surrounding halo or erythema.   Medical History Travis [name] has a past medical history of A-fib ([place]) (02/2016), Adhesive capsulitis of left shoulder (08/02/2014), Anxiety, Arthritis, Blood transfusion without reported diagnosis, Chronic headaches, Complication of anesthesia, Depression, Diabetes mellitus, Diabetic peripheral neuropathy ([place]) (03/28/2013), Diverticulosis, Dizziness, Hernia, inguinal, Hyperlipidemia, Hypertension, Impingement syndrome of left shoulder (08/02/2014), Ischemic colitis ([place]), Multi-infarct state (10/14/2014), Neuropathy of lower extremity, Night sweats, Sleep apnea, Stroke ([place]) (02/2015), and Syncope and collapse.   Outpatient Encounter Medications as of 09/17/2019  Medication Sig  . ACCU-CHEK FASTCLIX LANCETS MISC 1 Dose by Does not apply route 4 (four) times daily.  Marland Kitchen ALPRAZolam (XANAX) 0.25 MG tablet TAKE 1 TABLET BY MOUTH TWO TIMES DAILY AS  NEEDED  . apixaban (ELIQUIS) 5 MG TABS tablet Take 1 tablet (5 mg total) by mouth 2 (two) times daily.  Marland Kitchen atorvastatin (LIPITOR) 20 MG tablet TAKE 1 TABLET BY MOUTH ONCE DAILY  . Blood Glucose Monitoring Suppl (BLOOD GLUCOSE MONITOR SYSTEM) w/Device KIT Test glucose 2 time daily.  Meter/strips/lancets. Please dispense per patient/insurance preferrence  . Cholecalciferol 50 MCG (2000 UT) CAPS Take 1 capsule (2,000 Units total) by mouth daily with breakfast.  . doxycycline (VIBRA-TABS) 100 MG tablet Take 2 tablets (200 mg total) by mouth once for 1 dose.  . fluticasone (FLONASE) 50 MCG/ACT nasal spray Place 1 spray into both nostrils daily.   . folic acid (FOLVITE) 1 MG tablet TAKE 1 TABLET BY MOUTH DAILY.  Marland Kitchen gabapentin (NEURONTIN) 300 MG capsule Take 1 capsule (300 mg total) by mouth 3 (three) times daily.  . hyoscyamine (LEVSIN SL) 0.125 MG SL tablet Place 1 tablet (0.125 mg total) under the tongue daily before breakfast.  . Insulin Glargine (BASAGLAR KWIKPEN) 100 UNIT/ML Inject 0.5 mLs (50 Units total) into the skin at bedtime.  . Insulin Pen Needle (PEN NEEDLES) 31G X 6 MM MISC 1 each by Does not apply route daily.  . meclizine (ANTIVERT) 25 MG tablet TAKE 1 TABLET BY MOUTH 3 TIMES DAILY AS NEEDED FOR DIZZINESS (Patient not taking: No sig reported)  . metFORMIN (GLUCOPHAGE) 500 MG tablet TAKE 1 TABLET BY MOUTH TWO TIMES DAILY  . Multiple Vitamin (MULTIVITAMIN WITH MINERALS) TABS tablet Take 1 tablet daily by mouth.  Marland Kitchen UNIFINE PENTIPS 31G  X 8 MM MISC USE AS DIRECTED WITH LEVEMIR INJECTIONS DAILY  . valACYclovir (VALTREX) 1000 MG tablet TAKE 1 TABLET BY MOUTH ONCE DAILY  . [DISCONTINUED] doxycycline (VIBRA-TABS) 100 MG tablet Take 2 tablets (200 mg total) by mouth once for 1 dose.   No facility-administered encounter medications on file as of 09/17/2019.     Review of Systems  Constitutional: Negative for chills and fever.  Musculoskeletal: Negative for back pain, gait problem, joint swelling  and myalgias.       +rt lateral elbow pain.  Skin: Positive for rash (insect bite/rt posterior knee).  Neurological: Negative for weakness.     Vitals BP (!) 148/88   Temp (!) 97.5 F (36.4 C)   Wt 193 lb (87.5 kg)   BMI 25.46 kg/m   Objective:   Physical Exam Vitals reviewed.  Constitutional:      General: He is not in acute distress.    Appearance: Normal appearance. He is not ill-appearing.  HENT:     Head: Normocephalic.     Nose: Nose normal. No congestion.     Mouth/Throat:     Pharynx: No oropharyngeal exudate.  Eyes:     Extraocular Movements: Extraocular movements intact.     Conjunctiva/sclera: Conjunctivae normal.     Pupils: Pupils are equal, round, and reactive to light.  Pulmonary:     Effort: Pulmonary effort is normal. No respiratory distress.  Musculoskeletal:        General: Tenderness (rt lateral epicondyle, ttp.  normal rom. no swelling/erythema/ecchymosis) present. No swelling or signs of injury. Normal range of motion.     Right lower leg: No edema.     Left lower leg: No edema.  Skin:    General: Skin is warm and dry.     Findings: Rash (3 small erythematous papules on posterior fossa of rt knee., no erythema, warmth, or EM pattern) present. No bruising, erythema or lesion.  Neurological:     General: No focal deficit present.     Mental Status: He is alert and oriented to person, place, and time.  Psychiatric:        Mood and Affect: Mood normal.        Behavior: Behavior normal.      Assessment and Plan   1. Tick bite, initial encounter - doxycycline (VIBRA-TABS) 100 MG tablet; Take 2 tablets (200 mg total) by mouth once for 1 dose.  Dispense: 2 tablet; Refill: 0  2. Lateral epicondylitis of right elbow  -recommending - tennis elbow brace.  Tylenol and ice prn. Offer tramadol since pt on blood thinner and unable to take NSAIDs, pt declined.  Advising to monitor the tick bite/insect bite and return if seeing bull's eye rash. Looks  improved and gave prophylactic tx with doxycycline.   F/u prn.

## 2019-09-17 NOTE — Patient Instructions (Signed)
Tennis Elbow Tennis elbow is swelling (inflammation) in your outer forearm, near your elbow. Swelling affects the tissues that connect muscle to bone (tendons). Tennis elbow can happen in any sport or job in which you use your elbow too much. It is caused by doing the same motion over and over. Tennis elbow can cause:  Pain and tenderness in your forearm and the outer part of your elbow. You may have pain all the time, or only when using the arm.  A burning feeling. This runs from your elbow through your arm.  Weak grip in your hand. Follow these instructions at home: Activity  Rest your elbow and wrist. Avoid activities that cause problems, as told by your doctor.  If told by your doctor, wear an elbow strap to reduce stress on the area.  Do physical therapy exercises as told.  If you lift an object, lift it with your palm facing up. This is easier on your elbow. Lifestyle  If your tennis elbow is caused by sports, check your equipment and make sure that: ? You are using it correctly. ? It fits you well.  If your tennis elbow is caused by work or by using a computer, take breaks often to stretch your arm. Talk with your manager about how you can manage your condition at work. If you have a brace:  Wear the brace as told by your doctor. Remove it only as told by your doctor.  Loosen the brace if your fingers tingle, get numb, or turn cold and blue.  Keep the brace clean.  If the brace is not waterproof, ask your doctor if you may take the brace off for bathing. If you must keep the brace on while bathing: ? Do not let it get wet. ? Cover it with a watertight covering when you take a bath or a shower. General instructions   If told, put ice on the painful area: ? Put ice in a plastic bag. ? Place a towel between your skin and the bag. ? Leave the ice on for 20 minutes, 2-3 times a day.  Take over-the-counter and prescription medicines only as told by your doctor.  Keep  all follow-up visits as told by your doctor. This is important. Contact a doctor if:  Your pain does not get better with treatment.  Your pain gets worse.  You have weakness in your forearm, hand, or fingers.  You cannot feel your forearm, hand, or fingers. Summary  Tennis elbow is swelling (inflammation) in your outer forearm, near your elbow.  Tennis elbow is caused by doing the same motion over and over.  Rest your elbow and wrist. Avoid activities that cause problems, as told by your doctor.  If told, put ice on the painful area for 20 minutes, 2-3 times a day. This information is not intended to replace advice given to you by your health care provider. Make sure you discuss any questions you have with your health care provider. Document Revised: 01/13/2018 Document Reviewed: 02/01/2017 Elsevier Patient Education  2020 [place], Adult  Ticks are insects that can bite. Most ticks live in shrubs and grassy areas. They climb onto people and animals that go by. Then they bite. Some ticks carry germs that can make you sick. How can I prevent tick bites?  Use an insect repellent that has 20% or higher of the ingredients DEET, picaridin, or IR3535. Put this insect repellent on: ? Bare skin. ? The  tops of your boots. ? Your pant legs. ? The ends of your sleeves.  If you use an insect repellent that has the ingredient permethrin, make sure to follow the instructions on the bottle. Treat the following: ? Clothing. ? Supplies. ? Boots. ? Tents.  Wear long sleeves, long pants, and light colors.  Tuck your pant legs into your socks.  Stay in the middle of the trail.  Try not to walk through long grass.  Before going inside your house, check your clothes, hair, and skin for ticks. Make sure to check your head, neck, armpits, waist, groin, and joint areas.  Check for ticks every day.  When you come indoors: ? Wash your clothes right  away. ? Shower right away. ? Dry your clothes in a dryer on high heat for 60 minutes or more. What is the right way to remove a tick? Remove a tick from your skin as soon as possible.  To remove a tick that is crawling on your skin: ? Go outdoors and brush the tick off. ? Use tape or a lint roller.  To remove a tick that is biting: ? Wash your hands. ? If you have latex gloves, put them on. ? Use tweezers, curved forceps, or a tick-removal tool to grasp the tick. Grasp the tick as close to your skin and as close to the tick's head as possible. ? Gently pull up until the tick lets go.  Try to keep the tick's head attached to its body.  Do not twist or jerk the tick.  Do not squeeze or crush the tick. Do not try to remove a tick with heat, alcohol, petroleum jelly, or fingernail polish. How should I get rid of a tick? Here are some ways to get rid of a tick that is alive:  Place the tick in rubbing alcohol.  Place the tick in a bag or container you can close tightly.  Wrap the tick tightly in tape.  Flush the tick down the toilet. Contact a doctor if:  You have symptoms of a disease, such as: ? Pain in a muscle, joint, or bone. ? Trouble walking or moving your legs. ? Numbness in your legs. ? Inability to move (paralysis). ? A red rash that makes a circle (bull's-eye rash). ? Redness and swelling where the tick bit you. ? A fever. ? Throwing up (vomiting) over and over. ? Diarrhea. ? Weight loss. ? Tender and swollen lymph glands. ? Shortness of breath. ? Cough. ? Belly pain (abdominal pain). ? Headache. ? Being more tired than normal. ? A change in how alert (conscious) you are. ? Confusion. Get help right away if:  You cannot remove a tick.  A part of a tick breaks off and gets stuck in your skin.  You are feeling worse. Summary  Ticks may carry germs that can make you sick.  To prevent tick bites, wear long sleeves, long pants, and light colors. Use  insect repellent. Follow the instructions on the bottle.  If the tick is biting, do not try to remove it with heat, alcohol, petroleum jelly, or fingernail polish.  Use tweezers, curved forceps, or a tick-removal tool to grasp the tick. Gently pull up until the tick lets go. Do not twist or jerk the tick. Do not squeeze or crush the tick.  If you have symptoms, contact a doctor. This information is not intended to replace advice given to you by your health care provider. Make sure you discuss  any questions you have with your health care provider. Document Revised: 04/03/2018 Document Reviewed: 07/30/2016 Elsevier Patient Education  [place].

## 2019-09-18 MED FILL — DOXYCYCLINE HYCLATE 100 MG: 100 | 1 days supply | Qty: 2 | Fill #0 | Status: TO

## 2019-09-18 MED FILL — DOXYCYCLINE HYCLATE 100 MG: 100 | 1 days supply | Qty: 2 | Fill #0

## 2019-09-21 ENCOUNTER — Other Ambulatory Visit: Payer: Self-pay | Admitting: "Endocrinology

## 2019-09-21 ENCOUNTER — Other Ambulatory Visit: Payer: Self-pay | Admitting: Adult Health

## 2019-09-21 MED FILL — valACYclovir HCL 1 GM TABS: 1 | 30 days supply | Qty: 30 | Fill #3

## 2019-09-24 MED FILL — METFORMIN HCL 500 MG TABS: 500 | 90 days supply | Qty: 180 | Fill #0

## 2019-09-26 MED FILL — GABAPENTIN 300 MG CAPSULE: 300 | 90 days supply | Qty: 270 | Fill #0

## 2019-09-27 ENCOUNTER — Other Ambulatory Visit: Payer: Self-pay | Admitting: "Endocrinology

## 2019-09-27 MED FILL — UNIFINE PENTIPS 6MM 31G: 31G X 6 MM | 90 days supply | Qty: 100 | Fill #0

## 2019-10-11 ENCOUNTER — Ambulatory Visit (INDEPENDENT_AMBULATORY_CARE_PROVIDER_SITE_OTHER): Payer: No Typology Code available for payment source | Admitting: Family Medicine

## 2019-10-11 ENCOUNTER — Encounter: Payer: Self-pay | Admitting: Family Medicine

## 2019-10-11 ENCOUNTER — Other Ambulatory Visit: Payer: Self-pay

## 2019-10-11 VITALS — BP 140/86 | Temp 98.1°F | Wt 190.6 lb

## 2019-10-11 DIAGNOSIS — Z125 Encounter for screening for malignant neoplasm of prostate: Secondary | ICD-10-CM

## 2019-10-11 DIAGNOSIS — M7711 Lateral epicondylitis, right elbow: Secondary | ICD-10-CM | POA: Diagnosis not present

## 2019-10-11 DIAGNOSIS — I10 Essential (primary) hypertension: Secondary | ICD-10-CM | POA: Diagnosis not present

## 2019-10-11 DIAGNOSIS — E1159 Type 2 diabetes mellitus with other circulatory complications: Secondary | ICD-10-CM | POA: Diagnosis not present

## 2019-10-11 MED ORDER — DICLOFENAC SODIUM 75 MG PO TBEC
DELAYED_RELEASE_TABLET | ORAL | 0 refills | Status: DC
Start: 2019-10-11 — End: 2019-11-13

## 2019-10-11 MED ORDER — METFORMIN HCL 500 MG PO TABS: ORAL_TABLET | ORAL | 0 refills | Status: DC

## 2019-10-11 NOTE — Progress Notes (Signed)
   Subjective:    Patient ID: Travis [name], male    DOB: Oct 21, 1960, 59 y.o.   MRN: [id number]  Diabetes He presents for his follow-up diabetic visit. He has type 2 diabetes mellitus. Pertinent negatives for hypoglycemia include no confusion, dizziness or headaches. Pertinent negatives for diabetes include no chest pain and no fatigue. There are no diabetic complications. Current diabetic treatments: glargine and metformin. Eats fairly healthy. He is compliant with treatment all of the time.   Patient complains of right lower arm pain x 6 weeks. Patient complains of the pain in the right elbow region.  Consistent with lateral epicondylitis No other concerns.  Screening for prostate cancer - Plan: Hemoglobin X5O, Basic Metabolic Panel (BMET), PSA  DM type 2 causing vascular disease ([place]) - Plan: Hemoglobin I3G, Basic Metabolic Panel (BMET), PSA  Essential hypertension - Plan: Hemoglobin P4D, Basic Metabolic Panel (BMET), PSA  Lateral epicondylitis of right elbow   Covid vaccines complete.  Review of Systems  Constitutional: Negative for diaphoresis and fatigue.  HENT: Negative for congestion and rhinorrhea.   Respiratory: Negative for cough and shortness of breath.   Cardiovascular: Negative for chest pain and leg swelling.  Gastrointestinal: Negative for abdominal pain and diarrhea.  Skin: Negative for color change and rash.  Neurological: Negative for dizziness and headaches.  Psychiatric/Behavioral: Negative for behavioral problems and confusion.       Objective:   Physical Exam Vitals reviewed.  Constitutional:      General: He is not in acute distress. HENT:     Head: Normocephalic and atraumatic.  Eyes:     General:        Right eye: No discharge.        Left eye: No discharge.  Neck:     Trachea: No tracheal deviation.  Cardiovascular:     Rate and Rhythm: Normal rate and regular rhythm.     Heart sounds: Normal heart sounds. No murmur heard.   Pulmonary:      Effort: Pulmonary effort is normal. No respiratory distress.     Breath sounds: Normal breath sounds.  Lymphadenopathy:     Cervical: No cervical adenopathy.  Skin:    General: Skin is warm and dry.  Neurological:     Mental Status: He is alert.     Coordination: Coordination normal.  Psychiatric:        Behavior: Behavior normal.           Assessment & Plan:  1. Screening for prostate cancer PSA ordered patient will do lab work before his follow-up wellness exam in July - Hemoglobin I2M - Basic Metabolic Panel (BMET) - PSA  2. DM type 2 causing vascular disease ([place]) Diabetes with some erectile dysfunction very important for the patient to watch diet keep A1c under control exercise - Hemoglobin E1R - Basic Metabolic Panel (BMET) - PSA  3. Essential hypertension Blood pressure good control continue current measures - Hemoglobin A3E - Basic Metabolic Panel (BMET) - PSA  4. Lateral epicondylitis of right elbow Lateral epicondylitis anti-inflammatory for the next 10 days if not doing better recommend injection  Patient will work hard on diet exercise Increase Metformin to in the morning 1 in the evening Hopefully sugars will get under better control. Follow-up with Dr. Dorris Fetch regarding diabetes and adrenal adenoma  30 minutes including documentation and face-to-face

## 2019-10-17 MED FILL — MECLIZINE 25 MG TABLET: 25 | 10 days supply | Qty: 30 | Fill #3

## 2019-10-17 MED FILL — valACYclovir HCL 1 GM TABS: 1 | 30 days supply | Qty: 30 | Fill #4

## 2019-10-17 MED FILL — PREGABALIN 100 MG CAPS: 100 | 30 days supply | Qty: 90 | Fill #2

## 2019-10-17 MED FILL — ALPRAZolam 0.25 MG TABS: 0.25 | 30 days supply | Qty: 60 | Fill #1

## 2019-10-19 MED FILL — ELIQUIS 5 MG TABLET: 5 | 30 days supply | Qty: 60 | Fill #3

## 2019-10-24 ENCOUNTER — Telehealth: Payer: Self-pay | Admitting: Family Medicine

## 2019-10-24 ENCOUNTER — Encounter: Payer: Self-pay | Admitting: Family Medicine

## 2019-10-24 NOTE — Telephone Encounter (Signed)
Please advise. Thank you

## 2019-10-24 NOTE — Telephone Encounter (Signed)
I dictated a letter.  This is within epic.  Please notify family.  If they need to have [place] print the letter please do so otherwise it is available via MyChart

## 2019-10-24 NOTE — Telephone Encounter (Signed)
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 She states Dr. Nicki Reaper has wrote this letter every year for the past few years and she needs it again.

## 2019-10-31 ENCOUNTER — Ambulatory Visit (INDEPENDENT_AMBULATORY_CARE_PROVIDER_SITE_OTHER): Payer: No Typology Code available for payment source | Admitting: Family Medicine

## 2019-10-31 ENCOUNTER — Encounter: Payer: Self-pay | Admitting: Family Medicine

## 2019-10-31 ENCOUNTER — Other Ambulatory Visit: Payer: Self-pay

## 2019-10-31 VITALS — BP 132/80 | Temp 97.9°F | Ht 73.0 in | Wt 189.0 lb

## 2019-10-31 DIAGNOSIS — E1159 Type 2 diabetes mellitus with other circulatory complications: Secondary | ICD-10-CM | POA: Diagnosis not present

## 2019-10-31 DIAGNOSIS — T148XXA Other injury of unspecified body region, initial encounter: Secondary | ICD-10-CM

## 2019-10-31 DIAGNOSIS — M79662 Pain in left lower leg: Secondary | ICD-10-CM

## 2019-10-31 DIAGNOSIS — N200 Calculus of kidney: Secondary | ICD-10-CM | POA: Diagnosis not present

## 2019-10-31 LAB — POCT GLUCOSE (DEVICE FOR HOME USE): POC Glucose: 152 mg/dl — AB (ref 70–99)

## 2019-10-31 MED ORDER — MUPIROCIN 2 % EX OINT: TOPICAL_OINTMENT | CUTANEOUS | 0 refills | Status: AC

## 2019-10-31 NOTE — Progress Notes (Signed)
   Subjective:    Patient ID: Travis [name], male    DOB: 05/04/1960, 59 y.o.   MRN: [id number]  HPIsore on left ankle for 4 days fell several days ago.  Has an abrasion on the left ankle.  Worried about a possible ulcer..    Pt has concerns about kidney stones. Pt states he was told while he was in the hospital back in February that he had kidney stones. States he has back pain all the time.  Pt declined to get urine sample.  Had a CAT scan which showed degenerative disc disease some foraminal stenosis as well as spondylosis The stones are nonobstructing   Review of Systems No hematuria   See above no fever chills sweats vomiting or other problems Objective:   Physical Exam  Lungs clear respiratory rate normal heart regular no murmurs extremities no edema skin warm dry Negative straight leg raise bilateral subjective stiffness in the lower back Abrasion noted on the left ankle    Assessment & Plan:  Patient with abrasion on the left side of his ankle up-to-date on tetanus shot Bactroban recommended should gradually get better without difficulty  Kidney stones that he is concerned about is not going to cause any type of major setbacks or issues I did tell him the warning signs to watch for  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

## 2019-11-01 ENCOUNTER — Other Ambulatory Visit: Payer: Self-pay | Admitting: Family Medicine

## 2019-11-01 MED FILL — FLUTICASONE PROP 50 MCG SPR: 50 | 30 days supply | Qty: 16 | Fill #0

## 2019-11-01 MED FILL — BASAGLAR 100 UNIT/ML KWIKPE: 100 | 30 days supply | Qty: 15 | Fill #1

## 2019-11-01 MED FILL — MECLIZINE 25 MG TABLET: 25 | 10 days supply | Qty: 30 | Fill #4

## 2019-11-01 MED FILL — ATORVASTATIN 20 MG TABLET: 20 | 90 days supply | Qty: 90 | Fill #0

## 2019-11-09 LAB — BASIC METABOLIC PANEL
BUN/Creatinine Ratio: 15 (ref 10–24)
BUN: 15 mg/dL (ref 8–27)
CO2: 22 mmol/L (ref 20–29)
Calcium: 9.4 mg/dL (ref 8.6–10.2)
Chloride: 106 mmol/L (ref 96–106)
Creatinine, Ser: 1.03 mg/dL (ref 0.76–1.27)
GFR calc Af Amer: 90 mL/min/{1.73_m2} (ref >59)
GFR calc non Af Amer: 78 mL/min/{1.73_m2} (ref >59)
Glucose: 216 mg/dL — ABNORMAL HIGH (ref 65–99)
Potassium: 4 mmol/L (ref 3.5–5.2)
Sodium: 141 mmol/L (ref 134–144)

## 2019-11-09 LAB — HEMOGLOBIN A1C
Est. average glucose Bld gHb Est-mCnc: 189 mg/dL
Hgb A1c MFr Bld: 8.2 % — ABNORMAL HIGH (ref 4.8–5.6)

## 2019-11-09 LAB — PSA: Prostate Specific Ag, Serum: 1.4 ng/mL (ref 0.0–4.0)

## 2019-11-10 ENCOUNTER — Other Ambulatory Visit: Payer: Self-pay | Admitting: "Endocrinology

## 2019-11-10 DIAGNOSIS — E1159 Type 2 diabetes mellitus with other circulatory complications: Secondary | ICD-10-CM

## 2019-11-11 MED FILL — METFORMIN HCL 500 MG TABS: 500 | 90 days supply | Qty: 270 | Fill #0

## 2019-11-12 ENCOUNTER — Other Ambulatory Visit (HOSPITAL_COMMUNITY): Payer: Self-pay | Admitting: "Endocrinology

## 2019-11-12 MED FILL — valACYclovir HCL 1 GM TABS: 1 | 30 days supply | Qty: 30 | Fill #5

## 2019-11-13 ENCOUNTER — Other Ambulatory Visit: Payer: Self-pay

## 2019-11-13 ENCOUNTER — Encounter: Payer: Self-pay | Admitting: Family Medicine

## 2019-11-13 ENCOUNTER — Ambulatory Visit (INDEPENDENT_AMBULATORY_CARE_PROVIDER_SITE_OTHER): Payer: No Typology Code available for payment source | Admitting: Family Medicine

## 2019-11-13 VITALS — BP 120/74 | Temp 96.2°F | Ht 73.0 in | Wt 189.6 lb

## 2019-11-13 DIAGNOSIS — Z0000 Encounter for general adult medical examination without abnormal findings: Secondary | ICD-10-CM

## 2019-11-13 NOTE — Progress Notes (Signed)
° °  Subjective:    Patient ID: Travis [name], male    DOB: 1961/02/25, 59 y.o.   MRN: [id number]  HPI The patient comes in today for a wellness visit.    A review of their health history was completed.  A review of medications was also completed.  Any needed refills; none at this time  Eating habits: eats pretty good   Falls/  MVA accidents in past few months: no  Regular exercise: yes  Specialist pt sees on regular basis: none  Preventative health issues were discussed.   Additional concerns: pain in arms, shoulders and knees still    Review of Systems  Constitutional: Negative for diaphoresis and fatigue.  HENT: Negative for congestion and rhinorrhea.   Respiratory: Negative for cough and shortness of breath.   Cardiovascular: Negative for chest pain and leg swelling.  Gastrointestinal: Negative for abdominal pain and diarrhea.  Skin: Negative for color change and rash.  Neurological: Negative for dizziness and headaches.  Psychiatric/Behavioral: Negative for behavioral problems and confusion.       Objective:   Physical Exam Vitals reviewed.  Constitutional:      General: He is not in acute distress. HENT:     Head: Normocephalic and atraumatic.  Eyes:     General:        Right eye: No discharge.        Left eye: No discharge.  Neck:     Trachea: No tracheal deviation.  Cardiovascular:     Rate and Rhythm: Normal rate and regular rhythm.     Heart sounds: Normal heart sounds. No murmur heard.   Pulmonary:     Effort: Pulmonary effort is normal. No respiratory distress.     Breath sounds: Normal breath sounds.  Lymphadenopathy:     Cervical: No cervical adenopathy.  Skin:    General: Skin is warm and dry.  Neurological:     Mental Status: He is alert.     Coordination: Coordination normal.  Psychiatric:        Behavior: Behavior normal.   Prostate exam normal   Patient with some arthralgias in his elbows hips and hands related to normal aging  follow-up if progressive troubles     Assessment & Plan:  Adult wellness-complete.wellness physical was conducted today. Importance of diet and exercise were discussed in detail.  In addition to this a discussion regarding safety was also covered. We also reviewed over immunizations and gave recommendations regarding current immunization needed for age.  In addition to this additional areas were also touched on including: Preventative health exams needed:  Colonoscopy up-to-date on colonoscopy for the next 10 years  Patient was advised yearly wellness exam

## 2019-11-14 MED FILL — ALPRAZolam 0.25 MG TABS: 0.25 | 30 days supply | Qty: 60 | Fill #2

## 2019-11-14 MED FILL — ELIQUIS 5 MG TABLET: 5 | 90 days supply | Qty: 180 | Fill #0

## 2019-11-29 MED FILL — BASAGLAR 100 UNIT/ML KWIKPE: 100 | 30 days supply | Qty: 15 | Fill #0

## 2019-12-07 ENCOUNTER — Other Ambulatory Visit: Payer: Self-pay | Admitting: "Endocrinology

## 2019-12-07 LAB — MICROALBUMIN, URINE: Microalb, Ur: 5.7

## 2019-12-07 LAB — TSH: TSH: 2.12 (ref <=5.90)

## 2019-12-08 LAB — COMPREHENSIVE METABOLIC PANEL
ALT: 8 IU/L (ref 0–44)
AST: 16 IU/L (ref 0–40)
Albumin/Globulin Ratio: 1.9 (ref 1.2–2.2)
Albumin: 4.5 g/dL (ref 3.8–4.8)
Alkaline Phosphatase: 79 IU/L (ref 48–121)
BUN/Creatinine Ratio: 10 (ref 10–24)
BUN: 11 mg/dL (ref 8–27)
Bilirubin Total: 0.5 mg/dL (ref 0.0–1.2)
CO2: 22 mmol/L (ref 20–29)
Calcium: 9.1 mg/dL (ref 8.6–10.2)
Chloride: 106 mmol/L (ref 96–106)
Creatinine, Ser: 1.05 mg/dL (ref 0.76–1.27)
GFR calc Af Amer: 88 mL/min/{1.73_m2} (ref >59)
GFR calc non Af Amer: 76 mL/min/{1.73_m2} (ref >59)
Globulin, Total: 2.4 g/dL (ref 1.5–4.5)
Glucose: 91 mg/dL (ref 65–99)
Potassium: 4.2 mmol/L (ref 3.5–5.2)
Sodium: 141 mmol/L (ref 134–144)
Total Protein: 6.9 g/dL (ref 6.0–8.5)

## 2019-12-08 LAB — SPECIMEN STATUS REPORT

## 2019-12-08 LAB — T4, FREE: Free T4: 1.19 ng/dL (ref 0.82–1.77)

## 2019-12-08 LAB — MICROALBUMIN, URINE: Microalbumin, Urine: 5.7 ug/mL

## 2019-12-08 LAB — TSH: TSH: 2.12 u[IU]/mL (ref 0.450–4.500)

## 2019-12-11 ENCOUNTER — Other Ambulatory Visit: Payer: Self-pay | Admitting: Family Medicine

## 2019-12-11 ENCOUNTER — Other Ambulatory Visit: Payer: Self-pay | Admitting: Adult Health

## 2019-12-11 ENCOUNTER — Other Ambulatory Visit: Payer: Self-pay

## 2019-12-11 ENCOUNTER — Ambulatory Visit (INDEPENDENT_AMBULATORY_CARE_PROVIDER_SITE_OTHER): Payer: No Typology Code available for payment source | Admitting: "Endocrinology

## 2019-12-11 ENCOUNTER — Encounter: Payer: Self-pay | Admitting: "Endocrinology

## 2019-12-11 ENCOUNTER — Other Ambulatory Visit: Payer: Self-pay | Admitting: "Endocrinology

## 2019-12-11 VITALS — BP 110/78 | HR 92 | Ht 73.0 in | Wt 186.2 lb

## 2019-12-11 DIAGNOSIS — D3502 Benign neoplasm of left adrenal gland: Secondary | ICD-10-CM | POA: Diagnosis not present

## 2019-12-11 DIAGNOSIS — E559 Vitamin D deficiency, unspecified: Secondary | ICD-10-CM

## 2019-12-11 DIAGNOSIS — E782 Mixed hyperlipidemia: Secondary | ICD-10-CM

## 2019-12-11 DIAGNOSIS — E1159 Type 2 diabetes mellitus with other circulatory complications: Secondary | ICD-10-CM | POA: Diagnosis not present

## 2019-12-11 MED ORDER — METFORMIN HCL 500 MG PO TABS: 500.0000 mg | ORAL_TABLET | Freq: Two times a day (BID) | ORAL | 1 refills | Status: DC

## 2019-12-11 MED FILL — FLUTICASONE PROP 50 MCG SPR: 50 | 30 days supply | Qty: 16 | Fill #1

## 2019-12-11 MED FILL — FOLIC ACID 1 MG TABS: 1 | 90 days supply | Qty: 90 | Fill #1

## 2019-12-11 NOTE — Progress Notes (Signed)
12/11/2019, 3:50 PM              Endocrinology follow-up note    Subjective:    Patient ID: Travis [name], male    DOB: May 04, 1960.  Travis [name] is being seen in follow-up  for management of currently uncontrolled symptomatic diabetes requested by  Kathyrn Drown, MD.   Past Medical History:  Diagnosis Date  . A-fib ([place]) 02/2016   found on loop recorder  . 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 Anxiety   . Arthritis   . Blood transfusion without reported diagnosis   . Chronic headaches   . Complication of anesthesia    bleed after last shoulder-had to stay overnight  . Depression   . Diabetes mellitus   . Diabetic peripheral neuropathy ([place]) 03/28/2013  . Diverticulosis   . Dizziness   . Hernia, inguinal   . Hyperlipidemia   . Hypertension   . Impingement syndrome of left shoulder 08/02/2014  . Ischemic colitis ([place])   . 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 Neuropathy of lower extremity   . Night sweats    every once in a while  . Sleep apnea    no CPAP  . Stroke ([place]) 02/2015  . Syncope and collapse    Past Surgical History:  Procedure Laterality Date  . BIOPSY  06/14/2019   Procedure: BIOPSY;  Surgeon: [name], MD;  Location: [facility];  Service: Gastroenterology;;  . CERVICAL [place]    . COLON SURGERY  2003   1/3 removed for diverticulitis  . COLONOSCOPY    . COLONOSCOPY WITH PROPOFOL N/A 06/14/2019   Procedure: COLONOSCOPY WITH PROPOFOL;  Surgeon: [name], MD;  Location: [facility];  Service: Gastroenterology;  Laterality: N/A;  . EP IMPLANTABLE DEVICE N/A 05/01/2015   Procedure: Loop Recorder Insertion;  Surgeon: [name], MD;  Location: [facility];  Service: Cardiovascular;  Laterality: N/A;  . INGUINAL HERNIA REPAIR Bilateral   . KNEE ARTHROSCOPY Right   . SHOULDER ARTHROSCOPY Right    1  . SHOULDER ARTHROSCOPY Left 08/02/2014    Procedure: LEFT SHOULDER SCOPE DEBRIDEMENT/ACROMIOPLASTY;  Surgeon: [name], MD;  Location: [facility];  Service: Orthopedics;  Laterality: Left;  ANESTHESIA: GENERAL, PRE/POST OP SCALENE  . TEE WITHOUT CARDIOVERSION N/A 03/03/2015   Procedure: TRANSESOPHAGEAL ECHOCARDIOGRAM (TEE);  Surgeon: [name], MD;  Location: [facility];  Service: Cardiology;  Laterality: N/A;  . UMBILICAL HERNIA REPAIR     with other hernia repair with mesh  . WRIST SURGERY Right    fusion   Social History   Socioeconomic History  . Marital status: Married    Spouse name: Not on file  . Number of children: 2  . Years of education: College  . Highest education level: Not on file  Occupational History  . Occupation: disabled  Tobacco Use  . Smoking status: Former Smoker    Packs/day: 1.00    Years: 8.00    Pack years: 8.00    Types: Cigarettes    Start date: 06/07/1970    Quit date: 05/03/1978    Years since quitting: 41.6  . Smokeless tobacco: Former Systems developer  Types: Sarina Ser    Quit date: 05/03/1978  Vaping Use  . Vaping Use: Never used  Substance and Sexual Activity  . Alcohol use: No    Alcohol/week: 1.0 standard drink    Types: 1 Cans of beer per week    Comment: h/o heavy use in the past  . Drug use: No  . Sexual activity: Not on file  Other Topics Concern  . Not on file  Social History Narrative   Drinks some coffee, Drink diet sodas and tea   Social Determinants of Health   Financial Resource Strain:   . Difficulty of Paying Living Expenses:   Food Insecurity:   . Worried About Charity fundraiser in the Last Year:   . Arboriculturist in the Last Year:   Transportation Needs:   . Film/video editor (Medical):   Marland Kitchen Lack of Transportation (Non-Medical):   Physical Activity:   . Days of Exercise per Week:   . Minutes of Exercise per Session:   Stress:   . Feeling of Stress :   Social Connections:   . Frequency of Communication with Friends and Family:    . Frequency of Social Gatherings with Friends and Family:   . Attends Religious Services:   . Active Member of Clubs or Organizations:   . Attends Archivist Meetings:   Marland Kitchen Marital Status:    Outpatient Encounter Medications as of 12/11/2019  Medication Sig  . Accu-Chek FastClix Lancets MISC USE TO CHECK BLOOD SUGAR 2 TIMES A DAY  . ALPRAZolam (XANAX) 0.25 MG tablet TAKE 1 TABLET BY MOUTH TWO TIMES DAILY AS NEEDED  . apixaban (ELIQUIS) 5 MG TABS tablet Take 1 tablet (5 mg total) by mouth 2 (two) times daily.  Marland Kitchen atorvastatin (LIPITOR) 20 MG tablet TAKE 1 TABLET BY MOUTH ONCE DAILY  . Blood Glucose Monitoring Suppl (BLOOD GLUCOSE MONITOR SYSTEM) w/Device KIT Test glucose 2 time daily.  Meter/strips/lancets. Please dispense per patient/insurance preferrence  . Cholecalciferol 50 MCG (2000 UT) CAPS Take 1 capsule (2,000 Units total) by mouth daily with breakfast.  . fluticasone (FLONASE) 50 MCG/ACT nasal spray Place 1 spray into both nostrils daily.   . folic acid (FOLVITE) 1 MG tablet TAKE 1 TABLET BY MOUTH DAILY.  Marland Kitchen gabapentin (NEURONTIN) 300 MG capsule TAKE 1 CAPSULE BY MOUTH 3 TIMES DAILY  . hyoscyamine (LEVSIN SL) 0.125 MG SL tablet Place 1 tablet (0.125 mg total) under the tongue daily before breakfast.  . Insulin Glargine (BASAGLAR KWIKPEN) 100 UNIT/ML Inject 0.5 mLs (50 Units total) into the skin at bedtime.  . metFORMIN (GLUCOPHAGE) 500 MG tablet Take 1 tablet (500 mg total) by mouth 2 (two) times daily with a meal.  . Multiple Vitamin (MULTIVITAMIN WITH MINERALS) TABS tablet Take 1 tablet daily by mouth.  . mupirocin ointment (BACTROBAN) 2 % Apply to affected area 3 times daily  . UNIFINE PENTIPS 31G X 6 MM MISC USE DAILY AS DIRECTED  . UNIFINE PENTIPS 31G X 8 MM MISC USE AS DIRECTED WITH LEVEMIR INJECTIONS DAILY  . valACYclovir (VALTREX) 1000 MG tablet TAKE 1 TABLET BY MOUTH ONCE DAILY  . [DISCONTINUED] metFORMIN (GLUCOPHAGE) 500 MG tablet Take two tablets po in the morning  and one in the evening   No facility-administered encounter medications on file as of 12/11/2019.    ALLERGIES: Allergies  Allergen Reactions  . Lisinopril Cough    Patient/spouse is not aware/familiar with why this is listed as an allergy  VACCINATION STATUS: Immunization History  Administered Date(s) Administered  . Influenza Split 03/28/2013  . Influenza,inj,Quad PF,6+ Mos 02/18/2014, 01/23/2015, 02/06/2016, 02/23/2017, 02/24/2018  . Influenza-Unspecified 01/02/2011, 02/09/2019  . Moderna SARS-COVID-2 Vaccination 07/21/2019, 08/22/2019  . Pneumococcal Polysaccharide-23 01/31/2010, 03/01/2015  . Tdap 02/18/2014  . Zoster Recombinat (Shingrix) 04/19/2019, 06/29/2019    Diabetes He presents for his follow-up diabetic visit. He has type 2 diabetes mellitus. Onset time: He was diagnosed at approximate age of 38 years. His disease course has been improving (He has prior history of heavy alcohol use/abuse.). There are no hypoglycemic associated symptoms. Pertinent negatives for hypoglycemia include no confusion, headaches, pallor or seizures. Pertinent negatives for diabetes include no chest pain, no fatigue, no polydipsia, no polyphagia, no polyuria and no weakness. There are no hypoglycemic complications. Symptoms are improving. Diabetic complications include a CVA. Risk factors for coronary artery disease include dyslipidemia, diabetes mellitus, hypertension, family history, male sex and tobacco exposure. Current diabetic treatment includes insulin injections and oral agent (monotherapy). He is compliant with treatment most of the time. His weight is fluctuating minimally. He is following a generally unhealthy diet. When asked about meal planning, he reported none. He has had a previous visit with a dietitian. He participates in exercise intermittently. His home blood glucose trend is increasing steadily. His breakfast blood glucose range is generally 140-180 mg/dl. His overall blood glucose  range is 140-180 mg/dl. (He returns with a meter showing average blood glucose of 141 for the last 14 days.  He did not monitor blood glucose regularly.  He is previsit labs show A1c of 8.2%, improving from 8.6%.  He did not document or report hypoglycemia.      ) An ACE inhibitor/angiotensin II receptor blocker is contraindicated.  Hyperlipidemia This is a chronic problem. The current episode started more than 1 year ago. The problem is controlled. Recent lipid tests were reviewed and are normal. Exacerbating diseases include diabetes. Factors aggravating his hyperlipidemia include smoking. Pertinent negatives include no chest pain, myalgias or shortness of breath. Current antihyperlipidemic treatment includes statins. The current treatment provides moderate improvement of lipids. Risk factors for coronary artery disease include dyslipidemia, diabetes mellitus, hypertension, male sex, family history and a sedentary lifestyle.  Hypertension This is a chronic problem. The current episode started more than 1 year ago. The problem is controlled. Pertinent negatives include no chest pain, headaches, neck pain, palpitations or shortness of breath. Risk factors for coronary artery disease include dyslipidemia, diabetes mellitus, family history, male gender, sedentary lifestyle and smoking/tobacco exposure. Hypertensive end-organ damage includes CVA.    Review of Systems  Constitutional: Negative for chills, fatigue, fever and unexpected weight change.  HENT: Negative for dental problem, mouth sores and trouble swallowing.   Eyes: Negative for visual disturbance.  Respiratory: Negative for cough, choking, chest tightness, shortness of breath and wheezing.   Cardiovascular: Negative for chest pain, palpitations and leg swelling.  Gastrointestinal: Negative for abdominal distention, abdominal pain, constipation, diarrhea, nausea and vomiting.  Endocrine: Negative for polydipsia, polyphagia and polyuria.   Genitourinary: Negative for dysuria, flank pain, hematuria and urgency.  Musculoskeletal: Negative for back pain, gait problem, myalgias and neck pain.  Skin: Negative for pallor, rash and wound.  Neurological: Negative for seizures, syncope, weakness, numbness and headaches.  Psychiatric/Behavioral: Negative.  Negative for confusion and dysphoric mood.    Objective:    BP 110/78   Pulse 92   Ht _0  (1.854 m)   Wt 186 lb 3.2 oz (84.5 kg)   BMI  24.57 kg/m   Wt Readings from Last 3 Encounters:  12/11/19 186 lb 3.2 oz (84.5 kg)  11/13/19 189 lb 9.6 oz (86 kg)  10/31/19 189 lb (85.7 kg)      Physical Exam- Limited  Constitutional:  Body mass index is 24.57 kg/m. , not in acute distress, normal state of mind Eyes:  EOMI, no exophthalmos Neck: Supple Thyroid: No gross goiter Respiratory: Adequate breathing efforts Musculoskeletal: no gross deformities, strength intact in all four extremities, no gross restriction of joint movements Skin:  no rashes, no hyperemia Neurological: no tremor with outstretched hands,    CMP     Component Value Date/Time   NA 141 12/07/2019 1531   K 4.2 12/07/2019 1531   CL 106 12/07/2019 1531   CO2 22 12/07/2019 1531   GLUCOSE 91 12/07/2019 1531   GLUCOSE 217 (H) 06/16/2019 0520   BUN 11 12/07/2019 1531   CREATININE 1.05 12/07/2019 1531   CREATININE 0.82 02/16/2014 1053   CALCIUM 9.1 12/07/2019 1531   PROT 6.9 12/07/2019 1531   ALBUMIN 4.5 12/07/2019 1531   AST 16 12/07/2019 1531   ALT 8 12/07/2019 1531   ALKPHOS 79 12/07/2019 1531   BILITOT 0.5 12/07/2019 1531   GFRNONAA 76 12/07/2019 1531   GFRAA 88 12/07/2019 1531     Diabetic Labs (most recent): Lab Results  Component Value Date   HGBA1C 8.2 (H) 11/08/2019   HGBA1C 8.6 09/07/2019   HGBA1C 8.0 (H) 06/14/2019     Lipid Panel ( most recent) Lipid Panel     Component Value Date/Time   CHOL 95 (L) 12/08/2018 1023   TRIG 91 12/08/2018 1023   HDL 34 (L) 12/08/2018 1023    CHOLHDL 2.8 12/08/2018 1023   CHOLHDL 3.2 05/03/2016 0545   VLDL 16 05/03/2016 0545   LDLCALC 43 12/08/2018 1023      Lab Results  Component Value Date   TSH 2.120 12/07/2019   TSH 2.12 12/07/2019   TSH 1.960 03/12/2017   TSH 1.460 10/07/2016   TSH 0.720 05/02/2016   TSH 1.895 10/14/2014   FREET4 1.19 12/07/2019   FREET4 1.21 10/07/2016      Assessment & Plan:   1. DM type 2 causing vascular disease ([place]) - Jackey Loge has currently uncontrolled symptomatic type 2 DM since  59 years of age. He reports near target fasting glycemic profile and slightly above target postprandial blood glucose readings.    He returns with a meter showing average blood glucose of 141 for the last 14 days.  He did not monitor blood glucose regularly.  He is previsit labs show A1c of 8.2%, improving from 8.6%-overall improving from 14.6%.  He did not document or report hypoglycemia.    -his diabetes is complicated by CVA, history of smoking, history of heavy alcohol use/abuse in the past and ALAN DRUMMER remains at a high risk for more acute and chronic complications which include CAD, CVA, CKD, retinopathy, and neuropathy. These are all discussed in detail with the patient.  - I have counseled him on diet management  by adopting a carbohydrate restricted/protein rich diet.  - he  admits there is a room for improvement in his diet and drink choices. -  Suggestion is made for him to avoid simple carbohydrates  from his diet including Cakes, Sweet Desserts / Pastries, Ice Cream, Soda (diet and regular), Sweet Tea, Candies, Chips, Cookies, Sweet Pastries,  Store Bought Juices, Alcohol in Excess of  1-2 drinks a day, Artificial  Sweeteners, Coffee Creamer, and "Sugar-free" Products. This will help patient to have stable blood glucose profile and potentially avoid unintended weight gain.   - I encouraged him to switch to  unprocessed or minimally processed complex starch and increased protein intake  (animal or plant source), fruits, and vegetables.  - he is advised to stick to a routine mealtimes to eat 3 meals  a day and avoid unnecessary snacks ( to snack only to correct hypoglycemia).   - He is already following with Boyd Kerbs crumpton, CDE.  - I have approached him with the following individualized plan to manage diabetes and patient agrees:   - There could be a component of pancreatic endocrine and exocrine insufficiency given his prior history of heavy alcohol use. -This makes it likely that he will need multiple daily injections of insulin to control diabetes in the near future.  -#1 priority in this patient is to avoid hypoglycemia.   -He is advised to continue Basaglar  50 units nightly, associated with  strict monitoring of blood glucose 2 times a day before breakfast and at bedtime.  -Patient is encouraged to call clinic for blood glucose levels less than 70 or above 300 mg /dl. -He has normal renal function, he  will continue to benefit from low-dose Metformin.  He is advised to continue Metformin 500 mg p.o.  twice a day with meals, therapeutically suitable for patient .  -Patient is not a candidate for SGLT2 inhibitors, nor incretin therapy due to his body habitus.  - Patient specific target  A1c;  LDL, HDL, Triglycerides, and  Waist Circumference were discussed in detail.  2) BP/HTN:  His records indicate intolerance to lisinopril.  His blood pressure is controlled to target.    3) Lipids/HPL: His recent lipid panel showed controlled  LDL at 40.  He is advised to continue atorvastatin 20 mg p.o. nightly.   Side effects and precautions discussed with him.    4) left adrenal adenoma-during his recent hospitalization for GI bleed from ischemic colitis a CT scan showed incidental finding of 2.1 cm left adrenal adenoma.  Subsequent plasma metanephrines were within normal limits, considered nonfunctional.  he will not need intervention for it at this time.  He will be considered  for  repeat labs with 24-hour measurement of cortisol, metanephrines, and catecholamines after his next visit.    5)  Weight/Diet: His BMI is 24.5.  He is not a weight loss candidate.  CDE Consult  is in progress , exercise, and detailed carbohydrates information provided.  If he experiences unintentional weight loss, he will be considered for Creon therapy.   6) Chronic Care/Health Maintenance:  -he  is on Statin medications and  is encouraged to continue to follow up with Ophthalmology, Dentist,  Podiatrist at least yearly or according to recommendations, and advised to  stay away from smoking. I have recommended yearly flu vaccine and pneumonia vaccination at least every 5 years; moderate intensity exercise for up to 150 minutes weekly; and  sleep for at least 7 hours a day.  - I advised patient to maintain close follow up with Babs Sciara, MD for primary care needs.  - Time spent on this patient care encounter:  35 min, of which > 50% was spent in  counseling and the rest reviewing his blood glucose logs , discussing his hypoglycemia and hyperglycemia episodes, reviewing his current and  previous labs / studies  ( including abstraction from other facilities) and medications  doses and developing  a  long term treatment plan and documenting his care.   Please refer to Patient Instructions for Blood Glucose Monitoring and Insulin/Medications Dosing Guide"  in media tab for additional information. Please  also refer to " Patient Self Inventory" in the Media  tab for reviewed elements of pertinent patient history.  Jackey Loge participated in the discussions, expressed understanding, and voiced agreement with the above plans.  All questions were answered to his satisfaction. he is encouraged to contact clinic should he have any questions or concerns prior to his return visit.  Follow up plan: - Return in about 3 months (around 03/12/2020) for NV with Whitney, Bring Meter and Logs- A1c in  Office.  Glade Lloyd, MD [facility] [address] [place], [place] [address] Phone: [phone]  Fax: [phone]    12/11/2019, 3:50 PM  This note was partially dictated with voice recognition software. Similar sounding words can be transcribed inadequately or may not  be corrected upon review.

## 2019-12-11 NOTE — Patient Instructions (Signed)

## 2019-12-12 ENCOUNTER — Other Ambulatory Visit: Payer: Self-pay | Admitting: Family Medicine

## 2019-12-12 MED FILL — ALPRAZolam 0.25 MG TABS: 0.25 | 30 days supply | Qty: 60 | Fill #0

## 2019-12-12 MED FILL — valACYclovir HCL 1 GM TABS: 1 | 30 days supply | Qty: 30 | Fill #0

## 2019-12-27 ENCOUNTER — Other Ambulatory Visit: Payer: Self-pay | Admitting: "Endocrinology

## 2019-12-27 MED FILL — BASAGLAR 100 UNIT/ML KWIKPE: 100 | 90 days supply | Qty: 45 | Fill #0

## 2019-12-27 MED FILL — UNIFINE PENTIPS 6MM 31G: 31G X 6 MM | 90 days supply | Qty: 100 | Fill #1

## 2019-12-27 MED FILL — GABAPENTIN 300 MG CAPSULE: 300 | 90 days supply | Qty: 270 | Fill #0

## 2020-01-08 MED FILL — ALPRAZolam 0.25 MG TABS: 0.25 | 30 days supply | Qty: 60 | Fill #1

## 2020-01-10 ENCOUNTER — Encounter: Payer: Self-pay | Admitting: Adult Health

## 2020-01-10 ENCOUNTER — Other Ambulatory Visit: Payer: Self-pay | Admitting: Adult Health

## 2020-01-10 ENCOUNTER — Ambulatory Visit: Payer: No Typology Code available for payment source | Admitting: Adult Health

## 2020-01-10 ENCOUNTER — Other Ambulatory Visit: Payer: Self-pay

## 2020-01-10 VITALS — BP 108/73 | HR 95 | Ht 73.0 in | Wt 186.0 lb

## 2020-01-10 DIAGNOSIS — M5412 Radiculopathy, cervical region: Secondary | ICD-10-CM | POA: Diagnosis not present

## 2020-01-10 DIAGNOSIS — Z8673 Personal history of transient ischemic attack (TIA), and cerebral infarction without residual deficits: Secondary | ICD-10-CM | POA: Diagnosis not present

## 2020-01-10 DIAGNOSIS — E782 Mixed hyperlipidemia: Secondary | ICD-10-CM

## 2020-01-10 DIAGNOSIS — I480 Paroxysmal atrial fibrillation: Secondary | ICD-10-CM

## 2020-01-10 DIAGNOSIS — I10 Essential (primary) hypertension: Secondary | ICD-10-CM

## 2020-01-10 DIAGNOSIS — E1142 Type 2 diabetes mellitus with diabetic polyneuropathy: Secondary | ICD-10-CM

## 2020-01-10 MED ORDER — GABAPENTIN 300 MG PO CAPS
ORAL_CAPSULE | ORAL | 6 refills | Status: DC
Start: 2020-01-10 — End: 2020-01-10

## 2020-01-10 NOTE — Patient Instructions (Addendum)
Your Plan:  Continue gabapentin 300mg  AM, 300mg  afternoon, and increase night time dose to 600mg  nightly  Recommend obtaining MRI cervical spine for further evaluation of continued neck and arm symptoms  Continue eliquis and atorvastatin for secondary stroke prevention  Continue to follow with your PCP for blood pressure, cholesterol, diabetic management   Follow up in 6 months or call earlier if needed     Thank you for coming to see [place] at [facility]. I hope we have been able to provide you high quality care today.  You may receive a patient satisfaction survey over the next few weeks. We would appreciate your feedback and comments so that we may continue to improve ourselves and the health of our patients.

## 2020-01-10 NOTE — Progress Notes (Signed)
I agree with the above plan 
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
## 2020-01-14 ENCOUNTER — Telehealth: Payer: Self-pay | Admitting: Adult Health

## 2020-01-14 MED ORDER — LORAZEPAM 1 MG PO TABS: 1.0000 mg | ORAL_TABLET | Freq: Once | ORAL | 0 refills | Status: AC

## 2020-01-14 NOTE — Telephone Encounter (Signed)
unable to leave vmail the mail box is full  [facility] auth: [id number] (exp. 01/16/20 to 02/15/20)

## 2020-01-14 NOTE — Telephone Encounter (Signed)
Spoke to [place], aware of medication sent to pharmacy

## 2020-01-14 NOTE — Addendum Note (Signed)
Addended by: Mal Misty on: 01/14/2020 02:27 PM   Modules accepted: Orders

## 2020-01-14 NOTE — Telephone Encounter (Signed)
Patient called me back he is scheduled at [facility] for 01/22/20. He states he is claustrophobic but Xanax does not work him but wants something that will work for him. I told him if he takes anything he will have to have a driver.

## 2020-01-16 ENCOUNTER — Encounter: Payer: Self-pay | Admitting: Family Medicine

## 2020-01-17 MED FILL — LORazepam 1 MG TABS: 1 | 1 days supply | Qty: 2 | Fill #0

## 2020-01-17 MED FILL — valACYclovir HCL 1 GM TABS: 1 | 30 days supply | Qty: 30 | Fill #1

## 2020-01-22 ENCOUNTER — Ambulatory Visit: Payer: No Typology Code available for payment source

## 2020-01-22 ENCOUNTER — Other Ambulatory Visit: Payer: Self-pay

## 2020-01-22 DIAGNOSIS — M5412 Radiculopathy, cervical region: Secondary | ICD-10-CM

## 2020-01-28 ENCOUNTER — Telehealth: Payer: Self-pay

## 2020-01-28 DIAGNOSIS — M5412 Radiculopathy, cervical region: Secondary | ICD-10-CM

## 2020-01-28 MED FILL — MECLIZINE HCL 25 MG TABS: 25 | 10 days supply | Qty: 30 | Fill #5

## 2020-01-28 NOTE — Telephone Encounter (Signed)
Wife of Travis [name] is a 59 y.o. male [name] was contacted and notified of the message below. She has no additional questions but would like to f/u with a neurosurgeon

## 2020-01-28 NOTE — Telephone Encounter (Signed)
-----   Message from Frann Rider, NP sent at 01/28/2020 11:47 AM EDT ----- Please advise patient that recent imaging showed stable appearance compared to prior MRI in 2016 but due to worsening symptoms, recommend referral to neurosurgery for further evaluation.  If patient agreeable, order will be placed.  Thank you.

## 2020-01-31 ENCOUNTER — Ambulatory Visit (INDEPENDENT_AMBULATORY_CARE_PROVIDER_SITE_OTHER): Payer: No Typology Code available for payment source | Admitting: Family Medicine

## 2020-01-31 ENCOUNTER — Other Ambulatory Visit: Payer: Self-pay

## 2020-01-31 ENCOUNTER — Encounter: Payer: Self-pay | Admitting: Family Medicine

## 2020-01-31 ENCOUNTER — Other Ambulatory Visit: Payer: Self-pay | Admitting: Family Medicine

## 2020-01-31 VITALS — BP 130/68 | HR 85 | Temp 97.6°F | Wt 187.0 lb

## 2020-01-31 DIAGNOSIS — G4700 Insomnia, unspecified: Secondary | ICD-10-CM | POA: Diagnosis not present

## 2020-01-31 DIAGNOSIS — Z23 Encounter for immunization: Secondary | ICD-10-CM

## 2020-01-31 DIAGNOSIS — F411 Generalized anxiety disorder: Secondary | ICD-10-CM

## 2020-01-31 MED ORDER — ALPRAZOLAM 0.25 MG PO TABS
ORAL_TABLET | ORAL | 3 refills | Status: DC
Start: 2020-01-31 — End: 2020-03-03

## 2020-01-31 MED ORDER — FLUOXETINE HCL 10 MG PO CAPS: 10.0000 mg | ORAL_CAPSULE | Freq: Every day | ORAL | 1 refills | Status: DC

## 2020-01-31 MED FILL — FLUoxetine HCL 10 MG CAPS: 10 | 90 days supply | Qty: 90 | Fill #0

## 2020-01-31 NOTE — Progress Notes (Signed)
° °  Subjective:    Patient ID: Travis [name], male    DOB: 1961/02/19, 59 y.o.   MRN: [id number]  HPI Patient comes in today to discuss increasing his xanax. Complains of being being tired and wore out at night but unable to sleep. Also getting up 1-3 x per night to urinate.  GAD, PHQ-9 reviewed, patient not suicidal Having a lot of stress and anxiety. He gets worked up over multiple different items worries about his health as well. Denies any high fevers chills sweats denies any acute sickness No dysuria hematuria no rectal bleeding. Patient would like to increase the dose of his Xanax. We did discuss the benefits and the risk of this including risk of dementia with over usage.  Review of Systems  Constitutional: Negative for activity change, appetite change and fatigue.  HENT: Negative for congestion and rhinorrhea.   Respiratory: Negative for cough and shortness of breath.   Cardiovascular: Negative for chest pain and leg swelling.  Gastrointestinal: Negative for abdominal pain, nausea and vomiting.  Neurological: Negative for dizziness and headaches.  Psychiatric/Behavioral: Negative for agitation and behavioral problems.       Objective:   Physical Exam Constitutional:      General: He is not in acute distress.    Appearance: He is well-developed.  HENT:     Head: Normocephalic.  Cardiovascular:     Rate and Rhythm: Normal rate and regular rhythm.     Heart sounds: Normal heart sounds. No murmur heard.   Pulmonary:     Effort: Pulmonary effort is normal.     Breath sounds: Normal breath sounds.  Skin:    General: Skin is warm and dry.  Neurological:     Mental Status: He is alert.  Psychiatric:        Behavior: Behavior normal.      30 minutes spent with patient with discussion chart review and documentation     Assessment & Plan:  Diabetes under the direction of endocrinology continue with watching diet taking medication  Recent PSA looks good. I believe if  he gets his diabetes under better control he will have less urination at night  Chronic insomnia it is okay for him to take the Xanax at night. Obviously prefer there for him not to be on any medicine but the reality of the situation is low-dose Xanax 0.25 mg 1 in the evening may take 1 later if it does not help him enough not to drive when taking multiple Xanax Patient also has significant anxiety and worry I recommend starting Prozac also recommend a follow-up in 4 to 6 weeks he may take 1 Xanax during the day

## 2020-01-31 NOTE — Patient Instructions (Signed)
Start prozac one daily This can help decrease anxiousness Expect some nausea the first week then that should go away  As for the xanax may use one during the day And may use one near bedtime and if needing second xanax during the night may do so  Recheck here in 4 to 6 weeks here with me

## 2020-02-14 MED FILL — METFORMIN HCL 500 MG TABS: 500 | 90 days supply | Qty: 180 | Fill #0

## 2020-02-14 MED FILL — ALPRAZolam 0.25 MG TABS: 0.25 | 30 days supply | Qty: 60 | Fill #2

## 2020-02-14 MED FILL — valACYclovir HCL 1 GM TABS: 1 | 30 days supply | Qty: 30 | Fill #2

## 2020-02-14 MED FILL — FLUTICASONE PROP 50 MCG SPR: 50 | 30 days supply | Qty: 16 | Fill #2

## 2020-02-14 MED FILL — ATORVASTATIN CALCIUM 20 MG: 20 | 90 days supply | Qty: 90 | Fill #1

## 2020-02-14 MED FILL — ELIQUIS 5 MG TABLET: 5 | 90 days supply | Qty: 180 | Fill #1

## 2020-02-22 MED FILL — GABAPENTIN 300 MG CAPSULE: 300 | 30 days supply | Qty: 120 | Fill #0

## 2020-03-03 ENCOUNTER — Encounter: Payer: Self-pay | Admitting: Family Medicine

## 2020-03-03 ENCOUNTER — Other Ambulatory Visit: Payer: Self-pay

## 2020-03-03 ENCOUNTER — Other Ambulatory Visit: Payer: Self-pay | Admitting: Family Medicine

## 2020-03-03 ENCOUNTER — Ambulatory Visit (INDEPENDENT_AMBULATORY_CARE_PROVIDER_SITE_OTHER): Payer: No Typology Code available for payment source | Admitting: Family Medicine

## 2020-03-03 VITALS — BP 122/78 | Temp 97.3°F | Ht 73.0 in | Wt 183.2 lb

## 2020-03-03 DIAGNOSIS — F411 Generalized anxiety disorder: Secondary | ICD-10-CM | POA: Diagnosis not present

## 2020-03-03 MED ORDER — ALPRAZOLAM 0.25 MG PO TABS
ORAL_TABLET | ORAL | 3 refills | Status: DC
Start: 2020-03-03 — End: 2020-07-23

## 2020-03-03 MED ORDER — FLUOXETINE HCL 20 MG PO CAPS: 20.0000 mg | ORAL_CAPSULE | Freq: Every day | ORAL | 1 refills | Status: DC

## 2020-03-03 MED FILL — FLUoxetine HCL 20 MG CAPS: 20 | 90 days supply | Qty: 90 | Fill #0

## 2020-03-03 NOTE — Progress Notes (Signed)
   Subjective:    Patient ID: Travis [name], male    DOB: Jul 19, 1960, 59 y.o.   MRN: [id number]  HPI  Patient arrives for a follow up on anxiety and insomnia. Patient states he feels like he needs an increase in his xanax because he is running out. This gentleman recently has been having a difficult time with feeling stressed because of some situational issues but overall his overall health is hanging in there.  He relates he thought we had increased his medication but the last time he got it filled it was only twice per day when I checked the drug registry in fact it was filled twice per day but our prescription that we sent was 3 times a day Review of Systems  Constitutional: Negative for activity change, appetite change and fatigue.  HENT: Negative for congestion and rhinorrhea.   Respiratory: Negative for cough and shortness of breath.   Cardiovascular: Negative for chest pain and leg swelling.  Gastrointestinal: Negative for abdominal pain, nausea and vomiting.  Neurological: Negative for dizziness and headaches.  Psychiatric/Behavioral: Negative for agitation and behavioral problems.       Objective:   Physical Exam Constitutional:      General: He is not in acute distress.    Appearance: He is well-developed.  HENT:     Head: Normocephalic.  Cardiovascular:     Rate and Rhythm: Normal rate and regular rhythm.     Heart sounds: Normal heart sounds. No murmur heard.   Pulmonary:     Effort: Pulmonary effort is normal.     Breath sounds: Normal breath sounds.  Skin:    General: Skin is warm and dry.  Neurological:     Mental Status: He is alert.  Psychiatric:        Behavior: Behavior normal.     20 minutes spent with patient between chart review discussion with patient and documentation      Assessment & Plan:  Still has difficult time sleeping but is trying to work on things lessening stress lessening caffeine uses 1 Xanax at nighttime to help him rest  GAD-not  under good control currently still under a lot of stress issues.  Maximum use of Xanax is 3 times per day but preferred to be at 2 or less per day.  We will go ahead and bump up the dose of the Prozac 20 mg daily.  Patient should follow-up in 3 months follow-up sooner problems  I have encouraged this patient as he improves with the increase Prozac to try to taper his Xanax back down to 2/day

## 2020-03-05 ENCOUNTER — Other Ambulatory Visit: Payer: Self-pay | Admitting: "Endocrinology

## 2020-03-05 ENCOUNTER — Other Ambulatory Visit: Payer: Self-pay | Admitting: Family Medicine

## 2020-03-05 DIAGNOSIS — E1159 Type 2 diabetes mellitus with other circulatory complications: Secondary | ICD-10-CM

## 2020-03-05 MED FILL — ACCU-CHEK FASTCLIX LANCETS: 51 days supply | Qty: 102 | Fill #0

## 2020-03-06 NOTE — Telephone Encounter (Signed)
Nurses Please connect with patient I do not recommend this medication it causes a lot of drowsiness.  I would recommend discontinuing it.  Obviously if the patient strongly objects please let me know

## 2020-03-10 MED FILL — ALPRAZolam 0.25 MG TABS: 0.25 | 30 days supply | Qty: 90 | Fill #0

## 2020-03-12 ENCOUNTER — Other Ambulatory Visit: Payer: Self-pay

## 2020-03-12 ENCOUNTER — Ambulatory Visit (INDEPENDENT_AMBULATORY_CARE_PROVIDER_SITE_OTHER): Payer: No Typology Code available for payment source | Admitting: Nurse Practitioner

## 2020-03-12 ENCOUNTER — Encounter: Payer: Self-pay | Admitting: Nurse Practitioner

## 2020-03-12 VITALS — BP 125/79 | HR 82 | Ht 73.0 in | Wt 190.0 lb

## 2020-03-12 DIAGNOSIS — E782 Mixed hyperlipidemia: Secondary | ICD-10-CM | POA: Diagnosis not present

## 2020-03-12 DIAGNOSIS — D3502 Benign neoplasm of left adrenal gland: Secondary | ICD-10-CM | POA: Diagnosis not present

## 2020-03-12 DIAGNOSIS — E559 Vitamin D deficiency, unspecified: Secondary | ICD-10-CM

## 2020-03-12 DIAGNOSIS — E1159 Type 2 diabetes mellitus with other circulatory complications: Secondary | ICD-10-CM

## 2020-03-12 LAB — POCT GLYCOSYLATED HEMOGLOBIN (HGB A1C): Hemoglobin A1C: 7.2 % — AB (ref 4.0–5.6)

## 2020-03-12 NOTE — Progress Notes (Signed)
03/12/2020, 2:44 PM              Endocrinology follow-up note    Subjective:    Patient ID: Travis [name], male    DOB: 1960/09/03.  Travis [name] is being seen in follow-up  for management of currently uncontrolled symptomatic diabetes requested by  Kathyrn Drown, MD.   Past Medical History:  Diagnosis Date  . A-fib ([place]) 02/2016   found on loop recorder  . 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 Anxiety   . Arthritis   . Blood transfusion without reported diagnosis   . Chronic headaches   . Complication of anesthesia    bleed after last shoulder-had to stay overnight  . Depression   . Diabetes mellitus   . Diabetic peripheral neuropathy ([place]) 03/28/2013  . Diverticulosis   . Dizziness   . Hernia, inguinal   . Hyperlipidemia   . Hypertension   . Impingement syndrome of left shoulder 08/02/2014  . Ischemic colitis ([place])   . 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 Neuropathy of lower extremity   . Night sweats    every once in a while  . Sleep apnea    no CPAP  . Stroke ([place]) 02/2015  . Syncope and collapse    Past Surgical History:  Procedure Laterality Date  . BIOPSY  06/14/2019   Procedure: BIOPSY;  Surgeon: [name], MD;  Location: [facility];  Service: Gastroenterology;;  . CERVICAL [place]    . COLON SURGERY  2003   1/3 removed for diverticulitis  . COLONOSCOPY    . COLONOSCOPY WITH PROPOFOL N/A 06/14/2019   Procedure: COLONOSCOPY WITH PROPOFOL;  Surgeon: [name], MD;  Location: [facility];  Service: Gastroenterology;  Laterality: N/A;  . EP IMPLANTABLE DEVICE N/A 05/01/2015   Procedure: Loop Recorder Insertion;  Surgeon: [name], MD;  Location: [facility];  Service: Cardiovascular;  Laterality: N/A;  . INGUINAL HERNIA REPAIR Bilateral   . KNEE ARTHROSCOPY Right   . SHOULDER ARTHROSCOPY Right    1  . SHOULDER ARTHROSCOPY Left 08/02/2014    Procedure: LEFT SHOULDER SCOPE DEBRIDEMENT/ACROMIOPLASTY;  Surgeon: [name], MD;  Location: [facility];  Service: Orthopedics;  Laterality: Left;  ANESTHESIA: GENERAL, PRE/POST OP SCALENE  . TEE WITHOUT CARDIOVERSION N/A 03/03/2015   Procedure: TRANSESOPHAGEAL ECHOCARDIOGRAM (TEE);  Surgeon: [name], MD;  Location: [facility];  Service: Cardiology;  Laterality: N/A;  . UMBILICAL HERNIA REPAIR     with other hernia repair with mesh  . WRIST SURGERY Right    fusion   Social History   Socioeconomic History  . Marital status: Married    Spouse name: Not on file  . Number of children: 2  . Years of education: College  . Highest education level: Not on file  Occupational History  . Occupation: disabled  Tobacco Use  . Smoking status: Former Smoker    Packs/day: 1.00    Years: 8.00    Pack years: 8.00    Types: Cigarettes    Start date: 06/07/1970    Quit date: 05/03/1978    Years since quitting: 41.8  . Smokeless tobacco: Former Systems developer  Types: Sarina Ser    Quit date: 05/03/1978  Vaping Use  . Vaping Use: Never used  Substance and Sexual Activity  . Alcohol use: No    Alcohol/week: 1.0 standard drink    Types: 1 Cans of beer per week    Comment: h/o heavy use in the past  . Drug use: No  . Sexual activity: Not on file  Other Topics Concern  . Not on file  Social History Narrative   Drinks some coffee, Drink diet sodas and tea   Social Determinants of Health   Financial Resource Strain:   . Difficulty of Paying Living Expenses: Not on file  Food Insecurity:   . Worried About Charity fundraiser in the Last Year: Not on file  . Ran Out of Food in the Last Year: Not on file  Transportation Needs:   . Lack of Transportation (Medical): Not on file  . Lack of Transportation (Non-Medical): Not on file  Physical Activity:   . Days of Exercise per Week: Not on file  . Minutes of Exercise per Session: Not on file  Stress:   . Feeling of Stress : Not  on file  Social Connections:   . Frequency of Communication with Friends and Family: Not on file  . Frequency of Social Gatherings with Friends and Family: Not on file  . Attends Religious Services: Not on file  . Active Member of Clubs or Organizations: Not on file  . Attends Archivist Meetings: Not on file  . Marital Status: Not on file   Outpatient Encounter Medications as of 03/12/2020  Medication Sig  . Accu-Chek FastClix Lancets MISC USE TO CHECK BLOOD SUGAR 2 TIMES A DAY  . ALPRAZolam (XANAX) 0.25 MG tablet 1 tid prn  . apixaban (ELIQUIS) 5 MG TABS tablet Take 1 tablet (5 mg total) by mouth 2 (two) times daily.  Marland Kitchen atorvastatin (LIPITOR) 20 MG tablet TAKE 1 TABLET BY MOUTH ONCE DAILY  . Blood Glucose Monitoring Suppl (BLOOD GLUCOSE MONITOR SYSTEM) w/Device KIT Test glucose 2 time daily.  Meter/strips/lancets. Please dispense per patient/insurance preferrence  . Cholecalciferol 50 MCG (2000 UT) CAPS Take 1 capsule (2,000 Units total) by mouth daily with breakfast.  . FLUoxetine (PROZAC) 20 MG capsule Take 1 capsule (20 mg total) by mouth daily.  . fluticasone (FLONASE) 50 MCG/ACT nasal spray Place 1 spray into both nostrils daily.   . folic acid (FOLVITE) 1 MG tablet TAKE 1 TABLET BY MOUTH DAILY.  Marland Kitchen gabapentin (NEURONTIN) 300 MG capsule 357m AM, 3079mafternoon and 60057mightly  . hyoscyamine (LEVSIN SL) 0.125 MG SL tablet Place 1 tablet (0.125 mg total) under the tongue daily before breakfast.  . Insulin Glargine (BASAGLAR KWIKPEN) 100 UNIT/ML INJECT 50 UNITS INTO THE SKIN AT BEDTIME.  . metFORMIN (GLUCOPHAGE) 500 MG tablet Take 1 tablet (500 mg total) by mouth 2 (two) times daily with a meal.  . Multiple Vitamin (MULTIVITAMIN WITH MINERALS) TABS tablet Take 1 tablet daily by mouth.  . mupirocin ointment (BACTROBAN) 2 % Apply to affected area 3 times daily  . UNIFINE PENTIPS 31G X 6 MM MISC USE DAILY AS DIRECTED  . UNIFINE PENTIPS 31G X 8 MM MISC USE AS DIRECTED WITH  LEVEMIR INJECTIONS DAILY  . valACYclovir (VALTREX) 1000 MG tablet TAKE 1 TABLET BY MOUTH ONCE DAILY   No facility-administered encounter medications on file as of 03/12/2020.    ALLERGIES: Allergies  Allergen Reactions  . Lisinopril Cough    Patient/spouse is  not aware/familiar with why this is listed as an allergy    VACCINATION STATUS: Immunization History  Administered Date(s) Administered  . Influenza Split 03/28/2013  . Influenza,inj,Quad PF,6+ Mos 02/18/2014, 01/23/2015, 02/06/2016, 02/23/2017, 02/24/2018, 01/31/2020  . Influenza-Unspecified 01/02/2011, 02/09/2019  . Moderna SARS-COVID-2 Vaccination 07/21/2019, 08/22/2019  . Pneumococcal Polysaccharide-23 01/31/2010, 03/01/2015  . Tdap 02/18/2014  . Zoster Recombinat (Shingrix) 04/19/2019, 06/29/2019    Diabetes He presents for his follow-up diabetic visit. He has type 2 diabetes mellitus. Onset time: He was diagnosed at approximate age of 20 years. His disease course has been improving (He has prior history of heavy alcohol use/abuse.). There are no hypoglycemic associated symptoms. Pertinent negatives for hypoglycemia include no confusion, headaches, pallor or seizures. Pertinent negatives for diabetes include no chest pain, no fatigue, no polydipsia, no polyphagia, no polyuria and no weakness. There are no hypoglycemic complications. Symptoms are improving. Diabetic complications include a CVA, nephropathy and peripheral neuropathy. Risk factors for coronary artery disease include dyslipidemia, diabetes mellitus, hypertension, family history, male sex, tobacco exposure and sedentary lifestyle. Current diabetic treatment includes insulin injections and oral agent (monotherapy). He is compliant with treatment most of the time. His weight is decreasing steadily. He is following a generally unhealthy diet. When asked about meal planning, he reported none. He has had a previous visit with a dietitian. He participates in exercise  intermittently. His home blood glucose trend is decreasing steadily. (He presents today with 2 meters (1 meter reading error message), no logs.  His backup meter only has 7 readings in it.  He reports his morning averages are between 90-150.  He has questionable engagement for proper management of his diabetes.  He denies any episodes of hypoglycemia.  His POCT A1c today is 7.2%, improving since last visit of 8.2%.) An ACE inhibitor/angiotensin II receptor blocker is not being taken. He does not see a podiatrist.Eye exam is current.  Hyperlipidemia This is a chronic problem. The current episode started more than 1 year ago. The problem is controlled. Recent lipid tests were reviewed and are normal. Exacerbating diseases include chronic renal disease and diabetes. Factors aggravating his hyperlipidemia include smoking. Pertinent negatives include no chest pain, myalgias or shortness of breath. Current antihyperlipidemic treatment includes statins. The current treatment provides moderate improvement of lipids. Risk factors for coronary artery disease include dyslipidemia, diabetes mellitus, hypertension, male sex, family history and a sedentary lifestyle.  Hypertension This is a chronic problem. The current episode started more than 1 year ago. The problem has been gradually improving since onset. The problem is controlled. Pertinent negatives include no chest pain, headaches, neck pain, palpitations or shortness of breath. There are no associated agents to hypertension. Risk factors for coronary artery disease include dyslipidemia, diabetes mellitus, family history, male gender, sedentary lifestyle and smoking/tobacco exposure. Past treatments include nothing. Compliance problems include diet.  Hypertensive end-organ damage includes kidney disease, CAD/MI and CVA. Identifiable causes of hypertension include chronic renal disease and sleep apnea.    Review of systems  Constitutional: + Minimally fluctuating  body weight,  current Body mass index is 25.07 kg/m. , no fatigue, no subjective hyperthermia, no subjective hypothermia Eyes: no blurry vision, no xerophthalmia ENT: no sore throat, no nodules palpated in throat, no dysphagia/odynophagia, no hoarseness Cardiovascular: no chest pain, no shortness of breath, no palpitations, no leg swelling Respiratory: no cough, no shortness of breath Gastrointestinal: no nausea/vomiting/diarrhea Musculoskeletal: no muscle/joint aches Skin: no rashes, no hyperemia Neurological: no tremors, no numbness, no tingling, no dizziness Psychiatric: no  depression, no anxiety  Objective:    BP 125/79 (BP Location: Left Arm, Patient Position: Sitting)   Pulse 82   Ht '6\' 1"'  (1.854 m)   Wt 190 lb (86.2 kg)   BMI 25.07 kg/m   Wt Readings from Last 3 Encounters:  03/12/20 190 lb (86.2 kg)  03/03/20 183 lb 3.2 oz (83.1 kg)  01/31/20 187 lb (84.8 kg)    BP Readings from Last 3 Encounters:  03/12/20 125/79  03/03/20 122/78  01/31/20 130/68     Physical Exam- Limited  Constitutional:  Body mass index is 25.07 kg/m. , not in acute distress, + inattentive, hyperactive state of mind with rapid/pressured speech Eyes:  EOMI, no exophthalmos Neck: Supple Thyroid: No gross goiter Cardiovascular: RRR, no murmers, rubs, or gallops, no edema Respiratory: Adequate breathing efforts, no crackles, rales, rhonchi, or wheezing Musculoskeletal: no gross deformities, strength intact in all four extremities, no gross restriction of joint movements Skin:  no rashes, no hyperemia Neurological: no tremor with outstretched hands   CMP     Component Value Date/Time   NA 141 12/07/2019 1531   K 4.2 12/07/2019 1531   CL 106 12/07/2019 1531   CO2 22 12/07/2019 1531   GLUCOSE 91 12/07/2019 1531   GLUCOSE 217 (H) 06/16/2019 0520   BUN 11 12/07/2019 1531   CREATININE 1.05 12/07/2019 1531   CREATININE 0.82 02/16/2014 1053   CALCIUM 9.1 12/07/2019 1531   PROT 6.9 12/07/2019  1531   ALBUMIN 4.5 12/07/2019 1531   AST 16 12/07/2019 1531   ALT 8 12/07/2019 1531   ALKPHOS 79 12/07/2019 1531   BILITOT 0.5 12/07/2019 1531   GFRNONAA 76 12/07/2019 1531   GFRAA 88 12/07/2019 1531     Diabetic Labs (most recent): Lab Results  Component Value Date   HGBA1C 8.2 (H) 11/08/2019   HGBA1C 8.6 09/07/2019   HGBA1C 8.0 (H) 06/14/2019     Lipid Panel ( most recent) Lipid Panel     Component Value Date/Time   CHOL 95 (L) 12/08/2018 1023   TRIG 91 12/08/2018 1023   HDL 34 (L) 12/08/2018 1023   CHOLHDL 2.8 12/08/2018 1023   CHOLHDL 3.2 05/03/2016 0545   VLDL 16 05/03/2016 0545   LDLCALC 43 12/08/2018 1023      Lab Results  Component Value Date   TSH 2.120 12/07/2019   TSH 2.12 12/07/2019   TSH 1.960 03/12/2017   TSH 1.460 10/07/2016   TSH 0.720 05/02/2016   TSH 1.895 10/14/2014   FREET4 1.19 12/07/2019   FREET4 1.21 10/07/2016      Assessment & Plan:   1) DM type 2 causing vascular disease ([place]) - Travis [name] has currently uncontrolled symptomatic type 2 DM since  59 years of age. He reports near target fasting glycemic profile and slightly above target postprandial blood glucose readings.    He presents today with 2 meters (1 meter reading error message), no logs.  His backup meter only has 7 readings in it.  He reports his morning averages are between 90-150.  He has questionable engagement for proper management of his diabetes.  He denies any episodes of hypoglycemia.  His POCT A1c today is 7.2%, improving since last visit of 8.2%.    -his diabetes is complicated by CVA, history of smoking, history of heavy alcohol use/abuse in the past and Travis [name] remains at a high risk for more acute and chronic complications which include CAD, CVA, CKD, retinopathy, and neuropathy. These are all  discussed in detail with the patient.  - Nutritional counseling repeated at each appointment due to patients tendency to fall back in to old habits.  - The  patient admits there is a room for improvement in their diet and drink choices. -  Suggestion is made for the patient to avoid simple carbohydrates from their diet including Cakes, Sweet Desserts / Pastries, Ice Cream, Soda (diet and regular), Sweet Tea, Candies, Chips, Cookies, Sweet Pastries,  Store Bought Juices, Alcohol in Excess of  1-2 drinks a day, Artificial Sweeteners, Coffee Creamer, and "Sugar-free" Products. This will help patient to have stable blood glucose profile and potentially avoid unintended weight gain.   - I encouraged the patient to switch to  unprocessed or minimally processed complex starch and increased protein intake (animal or plant source), fruits, and vegetables.   - Patient is advised to stick to a routine mealtimes to eat 3 meals  a day and avoid unnecessary snacks ( to snack only to correct hypoglycemia).   - I have approached him with the following individualized plan to manage diabetes and patient agrees:   - There could be a component of pancreatic endocrine and exocrine insufficiency given his prior history of heavy alcohol use.  This makes it likely that he will need multiple daily injections of insulin to control diabetes in the near future.  -#1 priority in this patient is to avoid hypoglycemia.   -Based on his improved glycemic profile, he is advised to continue on current medication regimen consisting of Basaglar 50 units SQ daily at bedtime and Metformin 500 mg po twice daily with meals.  -He is advised to monitor blood glucose twice daily, before breakfast and before bed, and call the clinic if he has readings less than 70 or greater than 300 for 3 tests in a row.  -Patient is not a candidate for SGLT2 inhibitors, nor incretin therapy due to his body habitus.  - Patient specific target  A1c;  LDL, HDL, Triglycerides, and  Waist Circumference were discussed in detail.  2) BP/HTN:   His blood pressure is controlled to target without the use of  antihypertensive medications.  He will be considered for low dose ARB on subsequent visits if BP becomes elevated over 140/90.  3) Lipids/HPL:  His most recent lipid panel from 12/08/18 shows controlled LDL at 43.  He is advised to continue Lipitor 20 mg po daily at bedtime.  Side effects and precautions discussed with him.  4) Left adrenal adenoma- During a past hospitalization for GI bleed from ischemic colitis a CT scan showed incidental finding of 2.1 cm left adrenal adenoma.  Subsequent plasma metanephrines were within normal limits, considered nonfunctional.  He will not need intervention for it at this time.  He will be considered for repeat labs with 24-hour measurement of cortisol, metanephrines, and catecholamines on subsequent visits.   5)  Weight/Diet: His Body mass index is 25.07 kg/m.  He is not a weight loss candidate.  CDE Consult  is in progress , exercise, and detailed carbohydrates information provided.  If he experiences unintentional weight loss, he will be considered for Creon therapy.   6) Chronic Care/Health Maintenance: -he  is on Statin medications and  is encouraged to continue to follow up with Ophthalmology, Dentist,  Podiatrist at least yearly or according to recommendations, and advised to  stay away from smoking. I have recommended yearly flu vaccine and pneumonia vaccination at least every 5 years; moderate intensity exercise for up to 150  minutes weekly; and  sleep for at least 7 hours a day.  - I advised patient to maintain close follow up with Kathyrn Drown, MD for primary care needs.  - Time spent on this patient care encounter:  35 min, of which > 50% was spent in  counseling and the rest reviewing his blood glucose logs , discussing his hypoglycemia and hyperglycemia episodes, reviewing his current and  previous labs / studies  ( including abstraction from other facilities) and medications  doses and developing a  long term treatment plan and documenting his  care.   Please refer to Patient Instructions for Blood Glucose Monitoring and Insulin/Medications Dosing Guide"  in media tab for additional information. Please  also refer to " Patient Self Inventory" in the Media  tab for reviewed elements of pertinent patient history.  Travis [name] participated in the discussions, expressed understanding, and voiced agreement with the above plans.  All questions were answered to his satisfaction. he is encouraged to contact clinic should he have any questions or concerns prior to his return visit.  Follow up plan: - Return for Diabetes follow up with A1c in office, Previsit labs, Bring glucometer and logs.  Travis [name], [facility] [address] [place], [place] [address] Phone: [phone] Fax: [phone]    03/12/2020, 2:44 PM

## 2020-03-12 NOTE — Patient Instructions (Signed)

## 2020-03-12 NOTE — Telephone Encounter (Signed)
I am definitely sympathetic but there is no other medicine for dizziness that would not cause drowsiness.  I would recommend the The patient is then we discussed this further if he is having more troubles with this moving forward we can always see him sooner thank you

## 2020-03-13 ENCOUNTER — Ambulatory Visit: Payer: No Typology Code available for payment source | Admitting: Nurse Practitioner

## 2020-03-14 NOTE — Telephone Encounter (Signed)
Tried to call no answer

## 2020-03-22 ENCOUNTER — Other Ambulatory Visit: Payer: Self-pay | Admitting: Family Medicine

## 2020-03-22 MED FILL — valACYclovir HCL 1 GM TABS: 1 | 30 days supply | Qty: 30 | Fill #3

## 2020-03-22 MED FILL — FLUTICASONE PROP 50 MCG SPR: 50 | 30 days supply | Qty: 16 | Fill #3

## 2020-03-24 ENCOUNTER — Telehealth: Payer: Self-pay

## 2020-03-24 NOTE — Telephone Encounter (Signed)
Travis [name] dropped off a form to be completed for Disability Parking Placard placed in [name] folder    Pt call back [phone]

## 2020-03-25 ENCOUNTER — Other Ambulatory Visit: Payer: Self-pay | Admitting: Family Medicine

## 2020-03-25 MED FILL — FOLIC ACID 1 MG TABS: 1 | 90 days supply | Qty: 90 | Fill #0

## 2020-03-26 ENCOUNTER — Other Ambulatory Visit: Payer: Self-pay | Admitting: "Endocrinology

## 2020-03-26 MED FILL — GABAPENTIN 300 MG CAPSULE: 300 | 30 days supply | Qty: 120 | Fill #1

## 2020-03-26 MED FILL — BASAGLAR 100 UNIT/ML KWIKPE: 100 | 90 days supply | Qty: 45 | Fill #0

## 2020-03-26 MED FILL — UNIFINE PENTIPS 6MM 31G: 31G X 6 MM | 90 days supply | Qty: 100 | Fill #0

## 2020-03-28 NOTE — Telephone Encounter (Signed)
Form signed Plz fill in office admin stuff

## 2020-04-10 ENCOUNTER — Ambulatory Visit: Payer: No Typology Code available for payment source

## 2020-04-14 MED FILL — ALPRAZolam 0.25 MG TABS: 0.25 | 30 days supply | Qty: 90 | Fill #1

## 2020-04-19 MED FILL — GABAPENTIN 300 MG CAPSULE: 300 | 30 days supply | Qty: 120 | Fill #2

## 2020-04-23 ENCOUNTER — Telehealth: Payer: Self-pay

## 2020-04-23 DIAGNOSIS — E1159 Type 2 diabetes mellitus with other circulatory complications: Secondary | ICD-10-CM

## 2020-04-23 DIAGNOSIS — I639 Cerebral infarction, unspecified: Secondary | ICD-10-CM

## 2020-04-23 DIAGNOSIS — Z8673 Personal history of transient ischemic attack (TIA), and cerebral infarction without residual deficits: Secondary | ICD-10-CM

## 2020-04-23 NOTE — Telephone Encounter (Signed)
May have the referrals

## 2020-04-23 NOTE — Telephone Encounter (Signed)
Please advise. Thank you

## 2020-04-23 NOTE — Telephone Encounter (Signed)
[name] calling because Travis [name] has [employer] plan and his referrals for some of his specialists are expiring this month.  Patient isn't due for a visit with [place] until February.  Needs updated referrals in the system for Dr. Dorris Fetch ( [name] and for Dr. [name]  [name] will go online and do the required notifications through [place].  Patient has appointments with Endo and Neuro in March.

## 2020-04-24 NOTE — Telephone Encounter (Signed)
Referrals ordered in EPIC 

## 2020-04-28 ENCOUNTER — Telehealth: Payer: Self-pay

## 2020-04-28 NOTE — Telephone Encounter (Signed)
error 

## 2020-05-01 ENCOUNTER — Telehealth: Payer: Self-pay | Admitting: Family Medicine

## 2020-05-01 NOTE — Telephone Encounter (Signed)
Patient wife Travis [name] had FMLA faxed over to be updated on spouse. In your folder for review . Please review ,date and sign

## 2020-05-03 NOTE — Telephone Encounter (Signed)
Done. thanks

## 2020-05-06 DIAGNOSIS — Z029 Encounter for administrative examinations, unspecified: Secondary | ICD-10-CM

## 2020-05-07 MED FILL — METFORMIN HCL 500 MG TABS: 500 | 90 days supply | Qty: 180 | Fill #1

## 2020-05-07 MED FILL — valACYclovir HCL 1 GM TABS: 1 | 30 days supply | Qty: 30 | Fill #4

## 2020-05-07 MED FILL — ELIQUIS 5 MG TABLET: 5 | 90 days supply | Qty: 180 | Fill #2

## 2020-05-07 MED FILL — ACCU-CHEK FASTCLIX LANCETS: 51 days supply | Qty: 102 | Fill #1

## 2020-05-15 ENCOUNTER — Ambulatory Visit: Payer: No Typology Code available for payment source | Admitting: Family Medicine

## 2020-05-16 MED FILL — ALPRAZolam 0.25 MG TABS: 0.25 | 30 days supply | Qty: 90 | Fill #2

## 2020-05-21 ENCOUNTER — Other Ambulatory Visit: Payer: Self-pay

## 2020-05-21 ENCOUNTER — Other Ambulatory Visit (HOSPITAL_COMMUNITY): Payer: Self-pay | Admitting: Nurse Practitioner

## 2020-05-21 DIAGNOSIS — E1159 Type 2 diabetes mellitus with other circulatory complications: Secondary | ICD-10-CM

## 2020-05-21 MED ORDER — GLUCOSE BLOOD VI STRP: 1.0000 | ORAL_STRIP | Freq: Two times a day (BID) | 2 refills | Status: DC

## 2020-05-21 MED FILL — FREESTYLE LITE TEST STRIP: 90 days supply | Qty: 200 | Fill #0

## 2020-05-21 MED FILL — FREESTYLE LITE METER: W/DEVICE | 1 days supply | Qty: 1 | Fill #0

## 2020-05-29 ENCOUNTER — Other Ambulatory Visit: Payer: Self-pay | Admitting: Family Medicine

## 2020-05-29 ENCOUNTER — Other Ambulatory Visit: Payer: Self-pay | Admitting: *Deleted

## 2020-05-29 ENCOUNTER — Telehealth: Payer: Self-pay | Admitting: Family Medicine

## 2020-05-29 DIAGNOSIS — I10 Essential (primary) hypertension: Secondary | ICD-10-CM

## 2020-05-29 DIAGNOSIS — E7849 Other hyperlipidemia: Secondary | ICD-10-CM

## 2020-05-29 MED FILL — ATORVASTATIN CALCIUM 20 MG: 20 | 90 days supply | Qty: 90 | Fill #0

## 2020-05-29 NOTE — Telephone Encounter (Signed)
Labs ordered, wife notified

## 2020-05-29 NOTE — Telephone Encounter (Signed)
Last labs from Dr. Nicki Reaper- Psa, met7 and a1c in July.

## 2020-05-29 NOTE — Telephone Encounter (Signed)
Patient wanting to know if he needs labs done for his 3 month follow up on 06/03/2020

## 2020-05-29 NOTE — Telephone Encounter (Signed)
CMP, lipid

## 2020-05-30 ENCOUNTER — Telehealth: Payer: Self-pay | Admitting: Family Medicine

## 2020-05-30 NOTE — Telephone Encounter (Signed)
[employer] faxed over form to be completed please review . In your box to review

## 2020-05-30 NOTE — Telephone Encounter (Signed)
Please advise. Thank you

## 2020-05-31 LAB — COMPREHENSIVE METABOLIC PANEL
ALT: 11 IU/L (ref 0–44)
AST: 23 IU/L (ref 0–40)
Albumin/Globulin Ratio: 1.8 (ref 1.2–2.2)
Albumin: 4.4 g/dL (ref 3.8–4.8)
Alkaline Phosphatase: 85 IU/L (ref 44–121)
BUN/Creatinine Ratio: 9 — ABNORMAL LOW (ref 10–24)
BUN: 9 mg/dL (ref 8–27)
Bilirubin Total: 0.7 mg/dL (ref 0.0–1.2)
CO2: 25 mmol/L (ref 20–29)
Calcium: 9.3 mg/dL (ref 8.6–10.2)
Chloride: 103 mmol/L (ref 96–106)
Creatinine, Ser: 1.01 mg/dL (ref 0.76–1.27)
GFR calc Af Amer: 92 mL/min/{1.73_m2} (ref >59)
GFR calc non Af Amer: 80 mL/min/{1.73_m2} (ref >59)
Globulin, Total: 2.5 g/dL (ref 1.5–4.5)
Glucose: 153 mg/dL — ABNORMAL HIGH (ref 65–99)
Potassium: 4.2 mmol/L (ref 3.5–5.2)
Sodium: 141 mmol/L (ref 134–144)
Total Protein: 6.9 g/dL (ref 6.0–8.5)

## 2020-05-31 LAB — LIPID PANEL
Chol/HDL Ratio: 3.1 ratio (ref 0.0–5.0)
Cholesterol, Total: 116 mg/dL (ref 100–199)
HDL: 37 mg/dL — ABNORMAL LOW (ref >39)
LDL Chol Calc (NIH): 61 mg/dL (ref 0–99)
Triglycerides: 93 mg/dL (ref 0–149)
VLDL Cholesterol Cal: 18 mg/dL (ref 5–40)

## 2020-05-31 MED FILL — valACYclovir HCL 1 GM TABS: 1 | 30 days supply | Qty: 30 | Fill #5

## 2020-06-01 ENCOUNTER — Telehealth: Payer: Self-pay | Admitting: Family Medicine

## 2020-06-01 NOTE — Telephone Encounter (Signed)
Staff Please look over these FMLA papers.  Please write a few notes on a sheet of paper and include with FMLA.  Try to help decipher just what the world are they asking for thank you  This would help my day rather than me trying to figure all of this out

## 2020-06-03 ENCOUNTER — Ambulatory Visit (INDEPENDENT_AMBULATORY_CARE_PROVIDER_SITE_OTHER): Payer: No Typology Code available for payment source | Admitting: Family Medicine

## 2020-06-03 ENCOUNTER — Encounter: Payer: Self-pay | Admitting: Family Medicine

## 2020-06-03 ENCOUNTER — Other Ambulatory Visit: Payer: Self-pay

## 2020-06-03 ENCOUNTER — Other Ambulatory Visit: Payer: Self-pay | Admitting: Family Medicine

## 2020-06-03 VITALS — BP 134/82 | HR 81 | Temp 97.5°F | Wt 189.6 lb

## 2020-06-03 DIAGNOSIS — E1142 Type 2 diabetes mellitus with diabetic polyneuropathy: Secondary | ICD-10-CM

## 2020-06-03 DIAGNOSIS — F411 Generalized anxiety disorder: Secondary | ICD-10-CM

## 2020-06-03 DIAGNOSIS — I10 Essential (primary) hypertension: Secondary | ICD-10-CM | POA: Diagnosis not present

## 2020-06-03 DIAGNOSIS — M7580 Other shoulder lesions, unspecified shoulder: Secondary | ICD-10-CM | POA: Diagnosis not present

## 2020-06-03 DIAGNOSIS — E785 Hyperlipidemia, unspecified: Secondary | ICD-10-CM

## 2020-06-03 DIAGNOSIS — E1169 Type 2 diabetes mellitus with other specified complication: Secondary | ICD-10-CM

## 2020-06-03 MED ORDER — ATORVASTATIN CALCIUM 20 MG PO TABS: 20.0000 mg | ORAL_TABLET | Freq: Every day | ORAL | 1 refills | Status: DC

## 2020-06-03 MED ORDER — VALACYCLOVIR HCL 1 G PO TABS: 1000.0000 mg | ORAL_TABLET | Freq: Every day | ORAL | 5 refills | Status: DC

## 2020-06-03 NOTE — Progress Notes (Signed)
   Subjective:    Patient ID: PARTH MCCORMAC, male    DOB: 13-Jun-1960, 60 y.o.   MRN: [id number]  Diabetes He presents for his follow-up diabetic visit. He has type 2 diabetes mellitus. There are no hypoglycemic associated symptoms. Pertinent negatives for hypoglycemia include no confusion or headaches. There are no diabetic associated symptoms. Pertinent negatives for diabetes include no chest pain and no weakness. There are no hypoglycemic complications. There are no diabetic complications. Risk factors for coronary artery disease include male sex. He is compliant with treatment all of the time. (21-91;112;150-170)   Pt states he is having shoulder pain due to bicep tendon tear in both shoulders. Unable to reach in cabinets and put on belt. Feels a funny sensation in arm if holding arm up.   Essential hypertension  Diabetic peripheral neuropathy (HCC)  Hyperlipidemia associated with type 2 diabetes mellitus ([place])  Rotator cuff tendinitis, unspecified laterality - Plan: Ambulatory referral to Orthopedic Surgery  GAD (generalized anxiety disorder)    Review of Systems  Constitutional: Negative for activity change.  HENT: Negative for congestion and rhinorrhea.   Respiratory: Negative for cough and shortness of breath.   Cardiovascular: Negative for chest pain.  Gastrointestinal: Negative for abdominal pain, diarrhea, nausea and vomiting.  Genitourinary: Negative for dysuria and hematuria.  Musculoskeletal: Positive for arthralgias. Negative for back pain.  Neurological: Negative for weakness and headaches.  Psychiatric/Behavioral: Negative for behavioral problems and confusion.       Objective:   Physical Exam Vitals reviewed.  Constitutional:      Appearance: He is well-nourished.  Cardiovascular:     Rate and Rhythm: Normal rate and regular rhythm.     Heart sounds: Normal heart sounds. No murmur heard.   Pulmonary:     Effort: Pulmonary effort is normal.     Breath  sounds: Normal breath sounds.  Musculoskeletal:        General: No edema.  Lymphadenopathy:     Cervical: No cervical adenopathy.  Neurological:     Mental Status: He is alert.  Psychiatric:        Behavior: Behavior normal.    bilat shoulder pain and decrease range of motion       Assessment & Plan:  1. Essential hypertension Blood pressure decent control continue current medication watch diet stay physically active  2. Diabetic peripheral neuropathy (HCC) Decent control of diabetes currently seeing diabetologist on a regular basis Taking Neurontin for his feet No other intervention necessary  3. Hyperlipidemia associated with type 2 diabetes mellitus ([place]) Cholesterol looks great continue medication watch diet stay active  4. Rotator cuff tendinitis, unspecified laterality Bilateral shoulder problems referral to orthopedics. Further intervention BFM  Patient to follow-up by summertime for wellness 5. GAD (generalized anxiety disorder) Patient does take Xanax I encouraged him to taper it back to twice per day he states he has a very hard time because of severe anxiousness he does take SSRI and states it does help denies being depressed or suicidal

## 2020-06-07 MED FILL — FLUoxetine HCL 20 MG CAPS: 20 | 90 days supply | Qty: 90 | Fill #1

## 2020-06-16 MED FILL — FREESTYLE LANCETS: 50 days supply | Qty: 100 | Fill #0

## 2020-06-20 ENCOUNTER — Other Ambulatory Visit: Payer: Self-pay | Admitting: "Endocrinology

## 2020-06-20 ENCOUNTER — Other Ambulatory Visit: Payer: Self-pay | Admitting: Nurse Practitioner

## 2020-06-20 MED FILL — BASAGLAR 100 UNIT/ML KWIKPE: 100 | 90 days supply | Qty: 45 | Fill #0

## 2020-06-20 MED FILL — ALPRAZolam 0.25 MG TABS: 0.25 | 30 days supply | Qty: 90 | Fill #3

## 2020-06-20 MED FILL — FOLIC ACID 1 MG TABS: 1 | 90 days supply | Qty: 90 | Fill #1

## 2020-07-09 ENCOUNTER — Encounter: Payer: Self-pay | Admitting: Adult Health

## 2020-07-09 ENCOUNTER — Ambulatory Visit (INDEPENDENT_AMBULATORY_CARE_PROVIDER_SITE_OTHER): Payer: No Typology Code available for payment source | Admitting: Adult Health

## 2020-07-09 VITALS — BP 135/84 | HR 74 | Ht 73.0 in | Wt 189.0 lb

## 2020-07-09 DIAGNOSIS — M5412 Radiculopathy, cervical region: Secondary | ICD-10-CM | POA: Diagnosis not present

## 2020-07-09 DIAGNOSIS — Z8673 Personal history of transient ischemic attack (TIA), and cerebral infarction without residual deficits: Secondary | ICD-10-CM | POA: Diagnosis not present

## 2020-07-09 DIAGNOSIS — M79602 Pain in left arm: Secondary | ICD-10-CM | POA: Diagnosis not present

## 2020-07-09 DIAGNOSIS — M79601 Pain in right arm: Secondary | ICD-10-CM

## 2020-07-09 NOTE — Patient Instructions (Signed)
Your Plan:  Continue current treatment plan and ensure follow-up with orthopedics regarding upper extremity pains     Follow-up in 6 months or call earlier if needed     Thank you for coming to see [place] at [facility]. I hope we have been able to provide you high quality care today.  You may receive a patient satisfaction survey over the next few weeks. We would appreciate your feedback and comments so that we may continue to improve ourselves and the health of our patients.

## 2020-07-09 NOTE — Progress Notes (Signed)
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 daily. 30 tablet 5   No facility-administered medications prior to visit.    PAST MEDICAL HISTORY: Past Medical History:  Diagnosis Date  . A-fib ([place]) 02/2016   found on loop recorder  . Adhesive capsulitis of left shoulder 08/02/2014  . Anxiety   . Arthritis   . Blood transfusion without reported diagnosis   . Chronic headaches   . Complication of anesthesia    bleed after last shoulder-had to stay overnight  . Depression   . Diabetes mellitus   . Diabetic peripheral neuropathy ([place]) 03/28/2013  . Diverticulosis   . Dizziness   . Hernia, inguinal   . Hyperlipidemia   . Hypertension   . Impingement syndrome of left shoulder 08/02/2014  . Ischemic colitis ([place])   . 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 Neuropathy of lower extremity   . Night sweats    every once in a while  . Sleep apnea    no CPAP  . Stroke ([place]) 02/2015  . Syncope and collapse     PAST SURGICAL HISTORY: Past  Surgical History:  Procedure Laterality Date  . BIOPSY  06/14/2019   Procedure: BIOPSY;  Surgeon: [name], MD;  Location: [facility];  Service: Gastroenterology;;  . CERVICAL [place]    . COLON SURGERY  2003   1/3 removed for diverticulitis  . COLONOSCOPY    . COLONOSCOPY WITH PROPOFOL N/A 06/14/2019   Procedure: COLONOSCOPY WITH PROPOFOL;  Surgeon: [name], MD;  Location: [facility];  Service: Gastroenterology;  Laterality: N/A;  . EP IMPLANTABLE DEVICE N/A 05/01/2015   Procedure: Loop Recorder Insertion;  Surgeon: [name], MD;  Location: [facility];  Service: Cardiovascular;  Laterality: N/A;  . INGUINAL HERNIA REPAIR Bilateral   . KNEE ARTHROSCOPY Right   . SHOULDER ARTHROSCOPY Right    1  . SHOULDER ARTHROSCOPY Left 08/02/2014   Procedure: LEFT SHOULDER SCOPE DEBRIDEMENT/ACROMIOPLASTY;  Surgeon: [name], MD;  Location: [facility];  Service: Orthopedics;  Laterality: Left;  ANESTHESIA: GENERAL, PRE/POST OP SCALENE  . TEE WITHOUT CARDIOVERSION N/A 03/03/2015   Procedure: TRANSESOPHAGEAL ECHOCARDIOGRAM (TEE);  Surgeon: [name], MD;  Location: [facility];  Service: Cardiology;  Laterality: N/A;  . UMBILICAL HERNIA REPAIR     with other hernia repair with mesh  . WRIST SURGERY Right    fusion    FAMILY HISTORY: Family History  Problem Relation Age of Onset  . Stroke Father   . Hyperlipidemia Father   . Heart attack Sister 11  . Stroke Sister   . Dementia Mother   . ALS Brother        age 33  . Diabetes Maternal Grandfather   . Colon cancer Neg Hx   . Esophageal cancer Neg  Hx   . Stomach cancer Neg Hx   . Rectal cancer Neg Hx     SOCIAL HISTORY: Social History   Socioeconomic History  . Marital status: Married    Spouse name: Not on file  . Number of children: 2  . Years of education: College  . Highest education level: Not on file  Occupational History  . Occupation: disabled  Tobacco  Use  . Smoking status: Former Smoker    Packs/day: 1.00    Years: 8.00    Pack years: 8.00    Types: Cigarettes    Start date: 06/07/1970    Quit date: 05/03/1978    Years since quitting: 42.2  . Smokeless tobacco: Former Systems developer    Types: [place] date: 05/03/1978  Vaping Use  . Vaping Use: Never used  Substance and Sexual Activity  . Alcohol use: No    Alcohol/week: 1.0 standard drink    Types: 1 Cans of beer per week    Comment: h/o heavy use in the past  . Drug use: No  . Sexual activity: Not on file  Other Topics Concern  . Not on file  Social History Narrative   Drinks some coffee, Drink diet sodas and tea   Social Determinants of Health   Financial Resource Strain: Not on file  Food Insecurity: Not on file  Transportation Needs: Not on file  Physical Activity: Not on file  Stress: Not on file  Social Connections: Not on file  Intimate Partner Violence: Not on file     PHYSICAL EXAM  Vitals:   07/09/20 1419  BP: 135/84  Pulse: 74  Weight: 189 lb (85.7 kg)  Height: _0  (1.854 m)   Body mass index is 24.94 kg/m.  General: well developed, well nourished,  pleasant middle-age Caucasian [name], seated, in no evident distress Neck: supple with no carotid or supraclavicular bruits Cardiovascular: regular rate and rhythm, no murmurs Vascular:  Normal pulses all extremities Musculoskeletal: Limited ROM BUE 2/2 pain   Neurologic Exam Mental Status: Awake and fully alert. Occasional speech hesitancy. Oriented to place and time. Recent and remote memory intact. Attention span, concentration and fund of knowledge appropriate. Mood and affect appropriate.  Cranial Nerves: Pupils equal, briskly reactive to light. Extraocular movements full without nystagmus. Visual fields full to confrontation. Hearing intact. Facial sensation intact. Face, tongue, palate moves normally and symmetrically.  Motor: Normal bulk and tone. Normal strength in all tested extremity  muscles. Sensory.:  Decreased sensation to light touch left arm distally compared to right side (chronic) Coordination: Rapid alternating movements normal in all extremities except decreased left hand. Finger-to-nose and heel-to-shin performed accurately bilaterally. Gait and Station: Arises from chair without difficulty. Stance is normal. Gait demonstrates normal stride length and balance Reflexes: 1+ and symmetric. Toes downgoing.      ASSESSMENT AND PLAN Travis [name] is a 60 y.o. 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 On 02/28/15, right SCA cerebellar infarct with evidence of small PFO.  On 05/02/16, left dorsal medulla lacunar infarct and initiated aspirin in addition to Eliquis although discontinued 2021 in setting of GI bleed.  Greatest continued concern is in regards to upper extremity pain currently being evaluated by orthopedics  Hx of stroke -Continue Eliquis and atorvastatin for secondary stroke prevention and history of A. Fib -Continue close PCP follow-up for aggressive stroke risk factor management including HTN with BP goal<130/90, HLD with LDL goal<70 and DM with A1c goal<7.0  BUE shoulder/arm pain -Plans on MRI shoulders next week -per patient, it is believed that his arm pain is more from shoulder or biceps issue  Cervical radiculopathy -Referral placed to neurosurgery - per patient, he was seen by them but they were unable to offer him any further options due to history of multiple cervical procedures -Continue gabapentin 300/300/600 -MR CERVICAL 01/2020: C3-4 and C4-5 severe bilateral foraminal stenosis, C6-7 mild spinal stenosis and severe bilateral foraminal stenosis - per report, similar  appearance compared to 2016 -EMG/NCV 05/2018: Mild neuropathies left wrist, chronic denervation in left deltoid, left biceps and left tricep muscles which could be seen on left cervical polyradiculopathy (C5, C6 and C7).  Recommend correlation with MRI or CT cervical spine     Follow-up in 6 months or call earlier if needed   CC:  [facility] provider: Dr. Christene Slates, Elayne Snare, MD    I spent 40 minutes of face-to-face and non-face-to-face time with patient.  This included previsit chart review, lab review, study review, order entry, electronic health record documentation, patient education regarding prior history of stroke including etiology, importance of managing stroke risk factors, cervical radiculopathy and bilateral upper extremity pain and further evaluation and answered all other questions to patient satisfaction  Frann Rider, AGNP-BC  [facility] [address] [place], [place] [address]  Phone [phone] Fax [phone] Note: This document was prepared with digital dictation and possible smart phrase technology. Any transcriptional errors that result from this process are unintentional.

## 2020-07-11 ENCOUNTER — Encounter: Payer: Self-pay | Admitting: Family Medicine

## 2020-07-11 ENCOUNTER — Ambulatory Visit (INDEPENDENT_AMBULATORY_CARE_PROVIDER_SITE_OTHER): Payer: No Typology Code available for payment source | Admitting: Family Medicine

## 2020-07-11 ENCOUNTER — Other Ambulatory Visit: Payer: Self-pay | Admitting: Family Medicine

## 2020-07-11 ENCOUNTER — Other Ambulatory Visit: Payer: Self-pay

## 2020-07-11 DIAGNOSIS — R0981 Nasal congestion: Secondary | ICD-10-CM

## 2020-07-11 DIAGNOSIS — J301 Allergic rhinitis due to pollen: Secondary | ICD-10-CM | POA: Diagnosis not present

## 2020-07-11 MED ORDER — LORATADINE 10 MG PO TABS
10.0000 mg | ORAL_TABLET | Freq: Every day | ORAL | 2 refills | Status: DC
Start: 2020-07-11 — End: 2020-07-11

## 2020-07-11 MED FILL — LORATADINE 10 MG TABS: 10 | 90 days supply | Qty: 90 | Fill #0

## 2020-07-11 NOTE — Progress Notes (Signed)
   Subjective:    Patient ID: Travis [name], male    DOB: Oct 20, 1960, [age] y.o.   MRN: [id number]  Cough This is a new problem. The current episode started yesterday. Associated symptoms include nasal congestion.  Patient denies high fever chills sweats wheezing difficulty breathing   Review of Systems  Respiratory: Positive for cough.        Objective:   Physical Exam   Eardrums normal throat is normal neck no masses lungs clear respiratory rate normal heart regular no murmurs     Assessment & Plan:  More than likely allergies. Some congestion coughing no sign of pneumonia no need for antibiotics Covid test taken await the results OTC loratadine recommended

## 2020-07-11 NOTE — Telephone Encounter (Signed)
Nurses May work him in today at 4:10 PM

## 2020-07-12 LAB — COMPREHENSIVE METABOLIC PANEL
ALT: 9 IU/L (ref 0–44)
AST: 22 IU/L (ref 0–40)
Albumin/Globulin Ratio: 1.9 (ref 1.2–2.2)
Albumin: 4.5 g/dL (ref 3.8–4.8)
Alkaline Phosphatase: 75 IU/L (ref 44–121)
BUN/Creatinine Ratio: 10 (ref 10–24)
BUN: 10 mg/dL (ref 8–27)
Bilirubin Total: 0.6 mg/dL (ref 0.0–1.2)
CO2: 21 mmol/L (ref 20–29)
Calcium: 8.9 mg/dL (ref 8.6–10.2)
Chloride: 105 mmol/L (ref 96–106)
Creatinine, Ser: 0.98 mg/dL (ref 0.76–1.27)
Globulin, Total: 2.4 g/dL (ref 1.5–4.5)
Glucose: 164 mg/dL — ABNORMAL HIGH (ref 65–99)
Potassium: 4.4 mmol/L (ref 3.5–5.2)
Sodium: 140 mmol/L (ref 134–144)
Total Protein: 6.9 g/dL (ref 6.0–8.5)
eGFR: 87 mL/min/{1.73_m2} (ref >59)

## 2020-07-12 LAB — LIPID PANEL
Chol/HDL Ratio: 3.1 ratio (ref 0.0–5.0)
Cholesterol, Total: 103 mg/dL (ref 100–199)
HDL: 33 mg/dL — ABNORMAL LOW (ref >39)
LDL Chol Calc (NIH): 57 mg/dL (ref 0–99)
Triglycerides: 56 mg/dL (ref 0–149)
VLDL Cholesterol Cal: 13 mg/dL (ref 5–40)

## 2020-07-12 LAB — NOVEL CORONAVIRUS, NAA: SARS-CoV-2, NAA: NOT DETECTED

## 2020-07-12 LAB — SARS-COV-2, NAA 2 DAY TAT

## 2020-07-15 ENCOUNTER — Ambulatory Visit (INDEPENDENT_AMBULATORY_CARE_PROVIDER_SITE_OTHER): Payer: No Typology Code available for payment source | Admitting: Nurse Practitioner

## 2020-07-15 ENCOUNTER — Ambulatory Visit: Payer: No Typology Code available for payment source | Admitting: Family Medicine

## 2020-07-15 ENCOUNTER — Other Ambulatory Visit: Payer: Self-pay

## 2020-07-15 ENCOUNTER — Other Ambulatory Visit: Payer: Self-pay | Admitting: Nurse Practitioner

## 2020-07-15 ENCOUNTER — Encounter: Payer: Self-pay | Admitting: Nurse Practitioner

## 2020-07-15 VITALS — BP 122/84 | HR 82 | Ht 73.0 in | Wt 189.0 lb

## 2020-07-15 DIAGNOSIS — E1159 Type 2 diabetes mellitus with other circulatory complications: Secondary | ICD-10-CM | POA: Diagnosis not present

## 2020-07-15 DIAGNOSIS — E559 Vitamin D deficiency, unspecified: Secondary | ICD-10-CM | POA: Diagnosis not present

## 2020-07-15 DIAGNOSIS — E782 Mixed hyperlipidemia: Secondary | ICD-10-CM | POA: Diagnosis not present

## 2020-07-15 DIAGNOSIS — D3502 Benign neoplasm of left adrenal gland: Secondary | ICD-10-CM | POA: Diagnosis not present

## 2020-07-15 LAB — POCT GLYCOSYLATED HEMOGLOBIN (HGB A1C): HbA1c, POC (controlled diabetic range): 8.2 % — AB (ref 0.0–7.0)

## 2020-07-15 MED ORDER — TRESIBA FLEXTOUCH 100 UNIT/ML ~~LOC~~ SOPN: 50.0000 [IU] | PEN_INJECTOR | Freq: Every day | SUBCUTANEOUS | 3 refills | Status: DC

## 2020-07-15 NOTE — Progress Notes (Signed)
07/15/2020, 2:32 PM              Endocrinology follow-up note    Subjective:    Patient ID: Travis [name], male    DOB: [birth year].  Travis [name] is being seen in follow-up  for management of currently uncontrolled symptomatic diabetes requested by  Kathyrn Drown, MD.   Past Medical History:  Diagnosis Date  . A-fib ([place]) 02/2016   found on loop recorder  . 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 Anxiety   . Arthritis   . Blood transfusion without reported diagnosis   . Chronic headaches   . Complication of anesthesia    bleed after last shoulder-had to stay overnight  . Depression   . Diabetes mellitus   . Diabetic peripheral neuropathy ([place]) 03/28/2013  . Diverticulosis   . Dizziness   . Hernia, inguinal   . Hyperlipidemia   . Hypertension   . Impingement syndrome of left shoulder 08/02/2014  . Ischemic colitis ([place])   . 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 Neuropathy of lower extremity   . Night sweats    every once in a while  . Sleep apnea    no CPAP  . Stroke ([place]) 02/2015  . Syncope and collapse    Past Surgical History:  Procedure Laterality Date  . BIOPSY  06/14/2019   Procedure: BIOPSY;  Surgeon: [name], MD;  Location: [facility];  Service: Gastroenterology;;  . CERVICAL [place]    . COLON SURGERY  2003   1/3 removed for diverticulitis  . COLONOSCOPY    . COLONOSCOPY WITH PROPOFOL N/A 06/14/2019   Procedure: COLONOSCOPY WITH PROPOFOL;  Surgeon: [name], MD;  Location: [facility];  Service: Gastroenterology;  Laterality: N/A;  . EP IMPLANTABLE DEVICE N/A 05/01/2015   Procedure: Loop Recorder Insertion;  Surgeon: [name], MD;  Location: [facility];  Service: Cardiovascular;  Laterality: N/A;  . INGUINAL HERNIA REPAIR Bilateral   . KNEE ARTHROSCOPY Right   . SHOULDER ARTHROSCOPY Right    1  . SHOULDER ARTHROSCOPY Left 08/02/2014    Procedure: LEFT SHOULDER SCOPE DEBRIDEMENT/ACROMIOPLASTY;  Surgeon: [name], MD;  Location: [facility];  Service: Orthopedics;  Laterality: Left;  ANESTHESIA: GENERAL, PRE/POST OP SCALENE  . TEE WITHOUT CARDIOVERSION N/A 03/03/2015   Procedure: TRANSESOPHAGEAL ECHOCARDIOGRAM (TEE);  Surgeon: [name], MD;  Location: [facility];  Service: Cardiology;  Laterality: N/A;  . UMBILICAL HERNIA REPAIR     with other hernia repair with mesh  . WRIST SURGERY Right    fusion   Social History   Socioeconomic History  . Marital status: Married    Spouse name: Not on file  . Number of children: 2  . Years of education: College  . Highest education level: Not on file  Occupational History  . Occupation: disabled  Tobacco Use  . Smoking status: Former Smoker    Packs/day: 1.00    Years: 8.00    Pack years: 8.00    Types: Cigarettes    Start date: 06/07/1970    Quit date: 05/03/1978    Years since quitting: 42.2  . Smokeless tobacco: Former Systems developer  Types: Sarina Ser    Quit date: 05/03/1978  Vaping Use  . Vaping Use: Never used  Substance and Sexual Activity  . Alcohol use: No    Alcohol/week: 1.0 standard drink    Types: 1 Cans of beer per week    Comment: h/o heavy use in the past  . Drug use: No  . Sexual activity: Not on file  Other Topics Concern  . Not on file  Social History Narrative   Drinks some coffee, Drink diet sodas and tea   Social Determinants of Health   Financial Resource Strain: Not on file  Food Insecurity: Not on file  Transportation Needs: Not on file  Physical Activity: Not on file  Stress: Not on file  Social Connections: Not on file   Outpatient Encounter Medications as of 07/15/2020  Medication Sig  . Accu-Chek FastClix Lancets MISC USE TO CHECK BLOOD SUGAR 2 TIMES A DAY  . ALPRAZolam (XANAX) 0.25 MG tablet 1 tid prn  . apixaban (ELIQUIS) 5 MG TABS tablet Take 1 tablet (5 mg total) by mouth 2 (two) times daily.  Marland Kitchen atorvastatin  (LIPITOR) 20 MG tablet Take 1 tablet (20 mg total) by mouth daily.  . Blood Glucose Monitoring Suppl (BLOOD GLUCOSE MONITOR SYSTEM) w/Device KIT Test glucose 2 time daily.  Meter/strips/lancets. Please dispense per patient/insurance preferrence  . Cholecalciferol 50 MCG (2000 UT) CAPS Take 1 capsule (2,000 Units total) by mouth daily with breakfast.  . FLUoxetine (PROZAC) 20 MG capsule Take 1 capsule (20 mg total) by mouth daily.  . fluticasone (FLONASE) 50 MCG/ACT nasal spray Place 1 spray into both nostrils daily.   . folic acid (FOLVITE) 1 MG tablet TAKE 1 TABLET BY MOUTH ONCE A DAY  . gabapentin (NEURONTIN) 300 MG capsule 337m AM, 306mafternoon and 60036mightly  . glucose blood test strip 1 each by Other route 2 (two) times daily. Use as instructed to check blood glucose twice a day.  . hyoscyamine (LEVSIN SL) 0.125 MG SL tablet Place 1 tablet (0.125 mg total) under the tongue daily before breakfast.  . insulin degludec (TRESIBA FLEXTOUCH) 100 UNIT/ML FlexTouch Pen Inject 50 Units into the skin at bedtime.  . lMarland Kitchenratadine (CLARITIN) 10 MG tablet Take 1 tablet (10 mg total) by mouth daily.  . metFORMIN (GLUCOPHAGE) 500 MG tablet Take 1 tablet (500 mg total) by mouth 2 (two) times daily with a meal.  . Multiple Vitamin (MULTIVITAMIN WITH MINERALS) TABS tablet Take 1 tablet daily by mouth.  . mupirocin ointment (BACTROBAN) 2 % Apply to affected area 3 times daily  . UNIFINE PENTIPS 31G X 6 MM MISC USE DAILY AS DIRECTED  . valACYclovir (VALTREX) 1000 MG tablet Take 1 tablet (1,000 mg total) by mouth daily.  . [DISCONTINUED] Insulin Glargine (BASAGLAR KWIKPEN) 100 UNIT/ML INJECT 50 UNITS INTO THE SKIN AT BEDTIME   No facility-administered encounter medications on file as of 07/15/2020.    ALLERGIES: Allergies  Allergen Reactions  . Lisinopril Cough    Patient/spouse is not aware/familiar with why this is listed as an allergy    VACCINATION STATUS: Immunization History  Administered  Date(s) Administered  . Influenza Split 03/28/2013  . Influenza,inj,Quad PF,6+ Mos 02/18/2014, 01/23/2015, 02/06/2016, 02/23/2017, 02/24/2018, 01/31/2020  . Influenza-Unspecified 01/02/2011, 02/09/2019  . Moderna Sars-Covid-2 Vaccination 07/21/2019, 08/22/2019, 05/01/2020  . Pneumococcal Polysaccharide-23 01/31/2010, 03/01/2015  . Tdap 02/21/2003, 02/18/2014  . Zoster Recombinat (Shingrix) 04/19/2019, 06/29/2019    Diabetes He presents for his follow-up diabetic visit. He has type 2 diabetes  mellitus. Onset time: He was diagnosed at approximate age of 7 years. His disease course has been fluctuating (He has prior history of heavy alcohol use/abuse.). Hypoglycemia symptoms include sweats and tremors. Pertinent negatives for hypoglycemia include no confusion, headaches, pallor or seizures. Associated symptoms include foot paresthesias. Pertinent negatives for diabetes include no chest pain, no fatigue, no polydipsia, no polyphagia, no polyuria and no weakness. There are no hypoglycemic complications. Symptoms are stable. Diabetic complications include a CVA, nephropathy and peripheral neuropathy. Risk factors for coronary artery disease include dyslipidemia, diabetes mellitus, hypertension, family history, male sex, tobacco exposure and sedentary lifestyle. Current diabetic treatment includes insulin injections and oral agent (monotherapy). He is compliant with treatment most of the time. His weight is stable. He is following a generally unhealthy diet. When asked about meal planning, he reported none. He has had a previous visit with a dietitian. He participates in exercise intermittently. His home blood glucose trend is fluctuating minimally. His overall blood glucose range is 110-130 mg/dl. (He presents today with his meter, no logs showing some tight fasting glucose readings associated with meal timing.  His POCT A1c today is 8.2%, increasing from previous visit of 7.2%.  Analysis of his meter shows  7-day average of 111 with 5 readings; 14-day average of 117 with 12 readings, and 30-day average of 118 with 26 readings.  He denies any significant hypoglycemia.) An ACE inhibitor/angiotensin II receptor blocker is not being taken. He does not see a podiatrist.Eye exam is current.  Hyperlipidemia This is a chronic problem. The current episode started more than 1 year ago. The problem is controlled. Recent lipid tests were reviewed and are normal. Exacerbating diseases include chronic renal disease and diabetes. Factors aggravating his hyperlipidemia include smoking. Pertinent negatives include no chest pain, myalgias or shortness of breath. Current antihyperlipidemic treatment includes statins. The current treatment provides moderate improvement of lipids. There are no compliance problems.  Risk factors for coronary artery disease include dyslipidemia, diabetes mellitus, hypertension, male sex, family history and a sedentary lifestyle.  Hypertension This is a chronic problem. The current episode started more than 1 year ago. The problem has been gradually improving since onset. The problem is controlled. Associated symptoms include sweats. Pertinent negatives include no chest pain, headaches, neck pain, palpitations or shortness of breath. There are no associated agents to hypertension. Risk factors for coronary artery disease include dyslipidemia, diabetes mellitus, family history, male gender, sedentary lifestyle and smoking/tobacco exposure. Past treatments include nothing. Compliance problems include diet.  Hypertensive end-organ damage includes kidney disease, CAD/MI and CVA. Identifiable causes of hypertension include chronic renal disease and sleep apnea.    Review of systems  Constitutional: + Minimally fluctuating body weight,  current Body mass index is 24.94 kg/m. , no fatigue, no subjective hyperthermia, no subjective hypothermia Eyes: no blurry vision, no xerophthalmia ENT: no sore throat,  no nodules palpated in throat, no dysphagia/odynophagia, no hoarseness Cardiovascular: no chest pain, no shortness of breath, no palpitations, no leg swelling Respiratory: no cough, no shortness of breath Gastrointestinal: no nausea/vomiting/diarrhea Musculoskeletal: no muscle/joint aches, complains of bilateral arm pain (says he may need surgery in the future) Skin: no rashes, no hyperemia Neurological: no tremors, + numbness/tingling to BLE, no dizziness Psychiatric: no depression, no anxiety  Objective:    BP 122/84 (BP Location: Left Arm)   Pulse 82   Ht 6' 1" (1.854 m)   Wt 189 lb (85.7 kg)   BMI 24.94 kg/m   Wt Readings from Last 3 Encounters:  07/15/20 189 lb (85.7 kg)  07/09/20 189 lb (85.7 kg)  06/03/20 189 lb 9.6 oz (86 kg)    BP Readings from Last 3 Encounters:  07/15/20 122/84  07/09/20 135/84  06/03/20 134/82     Physical Exam- Limited  Constitutional:  Body mass index is 24.94 kg/m. , not in acute distress, + inattentive, hyperactive state of mind with rapid/pressured speech Eyes:  EOMI, no exophthalmos Neck: Supple Cardiovascular: RRR, no murmers, rubs, or gallops, no edema Respiratory: Adequate breathing efforts, no crackles, rales, rhonchi, or wheezing Musculoskeletal: no gross deformities, strength intact in all four extremities, no gross restriction of joint movements Skin:  no rashes, no hyperemia Neurological: no tremor with outstretched hands  POCT ABI Results 07/15/20   Right ABI:  1.31      Left ABI:  1.32  Right leg systolic / diastolic: 563/87 mmHg Left leg systolic / diastolic: 564/33 mmHg  Arm systolic / diastolic: 295/18 mmHG  Detailed report will be scanned into patient chart.    CMP     Component Value Date/Time   NA 140 07/11/2020 1114   K 4.4 07/11/2020 1114   CL 105 07/11/2020 1114   CO2 21 07/11/2020 1114   GLUCOSE 164 (H) 07/11/2020 1114   GLUCOSE 217 (H) 06/16/2019 0520   BUN 10 07/11/2020 1114   CREATININE 0.98  07/11/2020 1114   CREATININE 0.82 02/16/2014 1053   CALCIUM 8.9 07/11/2020 1114   PROT 6.9 07/11/2020 1114   ALBUMIN 4.5 07/11/2020 1114   AST 22 07/11/2020 1114   ALT 9 07/11/2020 1114   ALKPHOS 75 07/11/2020 1114   BILITOT 0.6 07/11/2020 1114   GFRNONAA 80 05/30/2020 1156   GFRAA 92 05/30/2020 1156     Diabetic Labs (most recent): Lab Results  Component Value Date   HGBA1C 8.2 (A) 07/15/2020   HGBA1C 7.2 (A) 03/12/2020   HGBA1C 8.2 (H) 11/08/2019     Lipid Panel ( most recent) Lipid Panel     Component Value Date/Time   CHOL 103 07/11/2020 1114   TRIG 56 07/11/2020 1114   HDL 33 (L) 07/11/2020 1114   CHOLHDL 3.1 07/11/2020 1114   CHOLHDL 3.2 05/03/2016 0545   VLDL 16 05/03/2016 0545   LDLCALC 57 07/11/2020 1114      Lab Results  Component Value Date   TSH 2.120 12/07/2019   TSH 2.12 12/07/2019   TSH 1.960 03/12/2017   TSH 1.460 10/07/2016   TSH 0.720 05/02/2016   TSH 1.895 10/14/2014   FREET4 1.19 12/07/2019   FREET4 1.21 10/07/2016      Assessment & Plan:   1) DM type 2 causing vascular disease ([place])  - Travis [name] has currently uncontrolled symptomatic type 2 DM since  60 years of age. He reports near target fasting glycemic profile and slightly above target postprandial blood glucose readings.      He presents today with his meter, no logs showing some tight fasting glucose readings associated with meal timing.  His POCT A1c today is 8.2%, increasing from previous visit of 7.2%.  Analysis of his meter shows 7-day average of 111 with 5 readings; 14-day average of 117 with 12 readings, and 30-day average of 118 with 26 readings.  He denies any significant hypoglycemia.  -his diabetes is complicated by CVA, history of smoking, history of heavy alcohol use/abuse in the past and CLANTON EMANUELSON remains at a high risk for more acute and chronic complications which include CAD, CVA, CKD, retinopathy, and neuropathy.  These are all discussed in detail with  the patient.  - Nutritional counseling repeated at each appointment due to patients tendency to fall back in to old habits.  - The patient admits there is a room for improvement in their diet and drink choices. -  Suggestion is made for the patient to avoid simple carbohydrates from their diet including Cakes, Sweet Desserts / Pastries, Ice Cream, Soda (diet and regular), Sweet Tea, Candies, Chips, Cookies, Sweet Pastries,  Store Bought Juices, Alcohol in Excess of  1-2 drinks a day, Artificial Sweeteners, Coffee Creamer, and "Sugar-free" Products. This will help patient to have stable blood glucose profile and potentially avoid unintended weight gain.   - I encouraged the patient to switch to  unprocessed or minimally processed complex starch and increased protein intake (animal or plant source), fruits, and vegetables.   - Patient is advised to stick to a routine mealtimes to eat 3 meals  a day and avoid unnecessary snacks ( to snack only to correct hypoglycemia).  - I have approached him with the following individualized plan to manage diabetes and patient agrees:   - There could be a component of pancreatic endocrine and exocrine insufficiency given his prior history of heavy alcohol use.  Therefore, he may need multiple daily injections of insulin to control diabetes in the near future.  -#1 priority in this patient is to avoid hypoglycemia.    -Given his stable glycemic profile with tight fasting readings at times, he will not tolerate increase in his insulin.  He is advised to continue Basaglar 50 units SQ nightly (will switch to [place] at same dose due to insurance coverage).  He can continue Metformin 500 mg po twice daily with meals for now.    -He is advised to monitor blood glucose twice daily, before breakfast and before bed, and call the clinic if he has readings less than 70 or greater than 300 for 3 tests in a row.  -Patient is not a candidate for SGLT2 inhibitors, nor incretin  therapy due to his body habitus.  - Patient specific target  A1c;  LDL, HDL, Triglycerides, and  Waist Circumference were discussed in detail.  2) BP/HTN:   His blood pressure is controlled to target without the use of antihypertensive medications.  He will be considered for low dose ARB on subsequent visits if BP becomes elevated over 140/90.  3) Lipids/HPL:  His most recent lipid panel from 07/11/20 shows controlled LDL at 57.  He is advised to continue Lipitor 20 mg po daily at bedtime.  Side effects and precautions discussed with him.  4) Left adrenal adenoma- During a past hospitalization for GI bleed from ischemic colitis a CT scan showed incidental finding of 2.1 cm left adrenal adenoma.  Subsequent plasma metanephrines were within normal limits, considered nonfunctional.  He will not need intervention for it at this time.  He is due for repeat labs.  Will recheck 24-hr urine metanephrines, catecholamines, cortisol, and aldosterone.    5)  Weight/Diet: His Body mass index is 24.94 kg/m.  He is not a weight loss candidate.  CDE Consult  is in progress , exercise, and detailed carbohydrates information provided.  If he experiences unintentional weight loss, he will be considered for Creon therapy.   6) Chronic Care/Health Maintenance: -he is on Statin medications and  is encouraged to continue to follow up with Ophthalmology, Dentist,  Podiatrist at least yearly or according to recommendations, and advised to  stay away from smoking.  I have recommended yearly flu vaccine and pneumonia vaccination at least every 5 years; moderate intensity exercise for up to 150 minutes weekly; and  sleep for at least 7 hours a day.  - I advised patient to maintain close follow up with Kathyrn Drown, MD for primary care needs.  - Time spent on this patient care encounter:  40 min, of which > 50% was spent in  counseling and the rest reviewing his blood glucose logs , discussing his hypoglycemia and  hyperglycemia episodes, reviewing his current and  previous labs / studies  ( including abstraction from other facilities) and medications  doses and developing a  long term treatment plan and documenting his care.   Please refer to Patient Instructions for Blood Glucose Monitoring and Insulin/Medications Dosing Guide"  in media tab for additional information. Please  also refer to " Patient Self Inventory" in the Media  tab for reviewed elements of pertinent patient history.  Travis [name] participated in the discussions, expressed understanding, and voiced agreement with the above plans.  All questions were answered to his satisfaction. he is encouraged to contact clinic should he have any questions or concerns prior to his return visit.  Follow up plan: - Return in about 4 months (around 11/14/2020) for Diabetes follow up- A1c and urine micro in office, Previsit labs, Bring glucometer and logs.  [name], [facility] [id number] [place], [place] [address] Phone: [phone] Fax: [phone]    07/15/2020, 2:32 PM

## 2020-07-15 NOTE — Patient Instructions (Signed)

## 2020-07-16 ENCOUNTER — Ambulatory Visit (HOSPITAL_COMMUNITY)
Admission: RE | Admit: 2020-07-16 | Discharge: 2020-07-16 | Disposition: A | Discharge status: Home / Self Care | Payer: No Typology Code available for payment source | Source: Ambulatory Visit | Attending: Nurse Practitioner | Admitting: Nurse Practitioner

## 2020-07-16 ENCOUNTER — Encounter (HOSPITAL_COMMUNITY): Payer: Self-pay | Admitting: Nurse Practitioner

## 2020-07-16 VITALS — BP 118/74 | HR 89 | Ht 73.0 in | Wt 189.4 lb

## 2020-07-16 DIAGNOSIS — Z794 Long term (current) use of insulin: Secondary | ICD-10-CM | POA: Diagnosis not present

## 2020-07-16 DIAGNOSIS — I480 Paroxysmal atrial fibrillation: Secondary | ICD-10-CM | POA: Insufficient documentation

## 2020-07-16 DIAGNOSIS — I10 Essential (primary) hypertension: Secondary | ICD-10-CM | POA: Insufficient documentation

## 2020-07-16 DIAGNOSIS — Z7901 Long term (current) use of anticoagulants: Secondary | ICD-10-CM | POA: Diagnosis not present

## 2020-07-16 DIAGNOSIS — Z79899 Other long term (current) drug therapy: Secondary | ICD-10-CM | POA: Diagnosis not present

## 2020-07-16 DIAGNOSIS — D6869 Other thrombophilia: Secondary | ICD-10-CM

## 2020-07-16 DIAGNOSIS — Z823 Family history of stroke: Secondary | ICD-10-CM | POA: Diagnosis not present

## 2020-07-16 DIAGNOSIS — Z87891 Personal history of nicotine dependence: Secondary | ICD-10-CM | POA: Insufficient documentation

## 2020-07-16 DIAGNOSIS — Z8673 Personal history of transient ischemic attack (TIA), and cerebral infarction without residual deficits: Secondary | ICD-10-CM | POA: Insufficient documentation

## 2020-07-16 DIAGNOSIS — Z7984 Long term (current) use of oral hypoglycemic drugs: Secondary | ICD-10-CM | POA: Insufficient documentation

## 2020-07-16 DIAGNOSIS — Z8249 Family history of ischemic heart disease and other diseases of the circulatory system: Secondary | ICD-10-CM | POA: Insufficient documentation

## 2020-07-16 LAB — CBC
HCT: 40.4 % (ref 39.0–52.0)
Hemoglobin: 13.8 g/dL (ref 13.0–17.0)
MCH: 33.5 pg (ref 26.0–34.0)
MCHC: 34.2 g/dL (ref 30.0–36.0)
MCV: 98.1 fL (ref 80.0–100.0)
Platelets: 195 10*3/uL (ref 150–400)
RBC: 4.12 MIL/uL — ABNORMAL LOW (ref 4.22–5.81)
RDW: 12.4 % (ref 11.5–15.5)
WBC: 6.3 10*3/uL (ref 4.0–10.5)
nRBC: 0 % (ref 0.0–0.2)

## 2020-07-16 NOTE — Progress Notes (Signed)
Primary Care Physician: Kathyrn Drown, MD Referring Physician: Dr. Andee Poles is a 60 y.o. male with a h/o CVA, s/p linq with transient afib noted that was seen by me in 2017 for start of eliquis. I have not seen him since then. He is here today to get refills on his eliquis. He has SR on his EKG and has not noted any irregular HR's. Review of his Paceart  device checks  have shown absence of afib. He is doing well. No bleeding issues. Last cmet  In December showed normal creatinine at 1.03.  F/u in afib clinic, 07/12/19. He is here for f/u afib  s/p Linq insertion after stroke 2017. As far as he is aware, he has had no further afib. He was in the hospital in February with a GI bleed. He was off anticoagulation for a few days  but has resumed. ASA was stopped. No reason for the bleed was identified. No further bleeding. CHA2DS2VASc score is at least 5. His Linq battery has since expired but he prefers to leave in place for now.  F/u in afib clinic, 07/27/2020. Linq battery is now dead. We discussed if he wanted to have removed, for now, he will continue to let it be.  He reports that he is not noted any afib. Had cmet 3/11 with PCP, but not a CBC, will draw today with ongoing eliquis use, CHA2DS2VASc of at least 6. No bleed issues.    Today, he denies symptoms of palpitations, chest pain, shortness of breath, orthopnea, PND, lower extremity edema, dizziness, presyncope, syncope, or neurologic sequela. The patient is tolerating medications without difficulties and is otherwise without complaint today.   Past Medical History:  Diagnosis Date  . A-fib ([place]) 02/2016   found on loop recorder  . Adhesive capsulitis of left shoulder 08/02/2014  . Anxiety   . Arthritis   . Blood transfusion without reported diagnosis   . Chronic headaches   . Complication of anesthesia    bleed after last shoulder-had to stay overnight  . Depression   . Diabetes mellitus   . Diabetic peripheral  neuropathy ([place]) 03/28/2013  . Diverticulosis   . Dizziness   . Hernia, inguinal   . Hyperlipidemia   . Hypertension   . Impingement syndrome of left shoulder 08/02/2014  . Ischemic colitis ([place])   . 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 Neuropathy of lower extremity   . Night sweats    every once in a while  . Sleep apnea    no CPAP  . Stroke ([place]) 02/2015  . Syncope and collapse    Past Surgical History:  Procedure Laterality Date  . BIOPSY  06/14/2019   Procedure: BIOPSY;  Surgeon: [name], MD;  Location: [facility];  Service: Gastroenterology;;  . CERVICAL [place]    . COLON SURGERY  2003   1/3 removed for diverticulitis  . COLONOSCOPY    . COLONOSCOPY WITH PROPOFOL N/A 06/14/2019   Procedure: COLONOSCOPY WITH PROPOFOL;  Surgeon: [name], MD;  [facility]: [facility];  Service: Gastroenterology;  Laterality: N/A;  . EP IMPLANTABLE DEVICE N/A 05/01/2015   Procedure: Loop Recorder Insertion;  Surgeon: [name], MD;  Location: [facility];  Service: Cardiovascular;  Laterality: N/A;  . INGUINAL HERNIA REPAIR Bilateral   . KNEE ARTHROSCOPY Right   . SHOULDER ARTHROSCOPY Right    1  . SHOULDER ARTHROSCOPY Left 08/02/2014   Procedure: LEFT  SHOULDER SCOPE DEBRIDEMENT/ACROMIOPLASTY;  Surgeon: [name], MD;  Location: [facility];  Service: Orthopedics;  Laterality: Left;  ANESTHESIA: GENERAL, PRE/POST OP SCALENE  . TEE WITHOUT CARDIOVERSION N/A 03/03/2015   Procedure: TRANSESOPHAGEAL ECHOCARDIOGRAM (TEE);  Surgeon: [name], MD;  [facility];  Service: Cardiology;  Laterality: N/A;  . UMBILICAL HERNIA REPAIR     with other hernia repair with mesh  . WRIST SURGERY Right    fusion    Current Outpatient Medications  Medication Sig Dispense Refill  . Accu-Chek FastClix Lancets MISC USE TO CHECK BLOOD SUGAR 2 TIMES A DAY 200 each 2  . ALPRAZolam (XANAX) 0.25 MG tablet 1 tid prn 90 tablet 3  . apixaban  (ELIQUIS) 5 MG TABS tablet Take 1 tablet (5 mg total) by mouth 2 (two) times daily. 180 tablet 3  . atorvastatin (LIPITOR) 20 MG tablet Take 1 tablet (20 mg total) by mouth daily. 90 tablet 1  . Blood Glucose Monitoring Suppl (BLOOD GLUCOSE MONITOR SYSTEM) w/Device KIT Test glucose 2 time daily.  Meter/strips/lancets. Please dispense per patient/insurance preferrence 1 each 5  . Cholecalciferol 50 MCG (2000 UT) CAPS Take 1 capsule (2,000 Units total) by mouth daily with breakfast. 30 each 6  . FLUoxetine (PROZAC) 20 MG capsule Take 1 capsule (20 mg total) by mouth daily. 90 capsule 1  . fluticasone (FLONASE) 50 MCG/ACT nasal spray Place 1 spray into both nostrils daily.     . folic acid (FOLVITE) 1 MG tablet TAKE 1 TABLET BY MOUTH ONCE A DAY 90 tablet 1  . gabapentin (NEURONTIN) 300 MG capsule 354m AM, 3065mafternoon and 60081mightly 120 capsule 6  . glucose blood test strip 1 each by Other route 2 (two) times daily. Use as instructed to check blood glucose twice a day. 200 each 2  . hyoscyamine (LEVSIN SL) 0.125 MG SL tablet Place 1 tablet (0.125 mg total) under the tongue daily before breakfast. 30 tablet 6  . insulin degludec (TRESIBA FLEXTOUCH) 100 UNIT/ML FlexTouch Pen Inject 50 Units into the skin at bedtime. 30 mL 3  . loratadine (CLARITIN) 10 MG tablet Take 1 tablet (10 mg total) by mouth daily. 90 tablet 2  . metFORMIN (GLUCOPHAGE) 500 MG tablet Take 1 tablet (500 mg total) by mouth 2 (two) times daily with a meal. 180 tablet 1  . Multiple Vitamin (MULTIVITAMIN WITH MINERALS) TABS tablet Take 1 tablet daily by mouth.    . mupirocin ointment (BACTROBAN) 2 % Apply to affected area 3 times daily 22 g 0  . UNIFINE PENTIPS 31G X 6 MM MISC USE DAILY AS DIRECTED 100 each 2  . valACYclovir (VALTREX) 1000 MG tablet Take 1 tablet (1,000 mg total) by mouth daily. 30 tablet 5   No current facility-administered medications for this encounter.    Allergies  Allergen Reactions  . Lisinopril Cough     Patient/spouse is not aware/familiar with why this is listed as an allergy    Social History   Socioeconomic History  . Marital status: Married    Spouse name: Not on file  . Number of children: 2  . Years of education: College  . Highest education level: Not on file  Occupational History  . Occupation: disabled  Tobacco Use  . Smoking status: Former Smoker    Packs/day: 1.00    Years: 8.00    Pack years: 8.00    Types: Cigarettes    Start date: 06/07/1970    Quit date: 05/03/1978  Years since quitting: 42.2  . Smokeless tobacco: Former Systems developer    Types: [place] date: 05/03/1978  Vaping Use  . Vaping Use: Never used  Substance and Sexual Activity  . Alcohol use: No    Alcohol/week: 1.0 standard drink    Types: 1 Cans of beer per week    Comment: h/o heavy use in the past  . Drug use: No  . Sexual activity: Not on file  Other Topics Concern  . Not on file  Social History Narrative   Drinks some coffee, Drink diet sodas and tea   Social Determinants of Health   Financial Resource Strain: Not on file  Food Insecurity: Not on file  Transportation Needs: Not on file  Physical Activity: Not on file  Stress: Not on file  Social Connections: Not on file  Intimate Partner Violence: Not on file    Family History  Problem Relation Age of Onset  . Stroke Father   . Hyperlipidemia Father   . Heart attack Sister 80  . Stroke Sister   . Dementia Mother   . ALS Brother        age 51  . Diabetes Maternal Grandfather   . Colon cancer Neg Hx   . Esophageal cancer Neg Hx   . Stomach cancer Neg Hx   . Rectal cancer Neg Hx     ROS- All systems are reviewed and negative except as per the HPI above  Physical Exam: There were no vitals filed for this visit. Wt Readings from Last 3 Encounters:  07/15/20 85.7 kg  07/09/20 85.7 kg  06/03/20 86 kg    Labs: Lab Results  Component Value Date   NA 140 07/11/2020   K 4.4 07/11/2020   CL 105 07/11/2020   CO2 21  07/11/2020   GLUCOSE 164 (H) 07/11/2020   BUN 10 07/11/2020   CREATININE 0.98 07/11/2020   CALCIUM 8.9 07/11/2020   PHOS 3.2 06/13/2019   MG 2.0 06/13/2019   Lab Results  Component Value Date   INR 1.15 05/02/2016   Lab Results  Component Value Date   CHOL 103 07/11/2020   HDL 33 (L) 07/11/2020   LDLCALC 57 07/11/2020   TRIG 56 07/11/2020     GEN- The patient is well appearing, alert and oriented x 3 today.   Head- normocephalic, atraumatic Eyes-  Sclera clear, conjunctiva pink Ears- hearing intact Oropharynx- clear Neck- supple, no JVP Lymph- no cervical lymphadenopathy Lungs- Clear to ausculation bilaterally, normal work of breathing Heart- Regular rate and rhythm, no murmurs, rubs or gallops, PMI not laterally displaced GI- soft, NT, ND, + BS Extremities- no clubbing, cyanosis, or edema MS- no significant deformity or atrophy Skin- no rash or lesion Psych- euthymic mood, full affect Neuro- strength and sensation are intact  EKG-NSR at 89  bpm, PR int 150  ms, qrs int 88  ms, qtc 413 ms Epic records reviewed  Epic records reviewed  Assessment and Plan: 1. Paroxysmal afib Seen on LInq after stroke 2017 Now with expired battery, does not want replaced or removed at this point Afib appears to be quiet  Continue eliquis 5 mg bid for a CHA2DS2VASc score of at least 5  Appropriately dosed  CBC today   2. HTN Stable   F/u in one year  Geroge Baseman. Kashara Blocher, [facility] [address] [place], [place] [address] [phone]

## 2020-07-20 NOTE — Progress Notes (Signed)
I agree with the above plan 

## 2020-07-23 ENCOUNTER — Other Ambulatory Visit: Payer: Self-pay | Admitting: Family Medicine

## 2020-07-23 ENCOUNTER — Encounter: Payer: Self-pay | Admitting: Family Medicine

## 2020-07-23 MED FILL — ALPRAZolam 0.25 MG TABS: 0.25 | 30 days supply | Qty: 90 | Fill #0

## 2020-07-23 MED FILL — GABAPENTIN 300 MG CAPSULE: 300 | 30 days supply | Qty: 120 | Fill #3

## 2020-07-25 ENCOUNTER — Other Ambulatory Visit (HOSPITAL_BASED_OUTPATIENT_CLINIC_OR_DEPARTMENT_OTHER): Payer: Self-pay

## 2020-08-04 ENCOUNTER — Other Ambulatory Visit (HOSPITAL_COMMUNITY): Payer: Self-pay

## 2020-08-04 MED ORDER — PREDNISONE 5 MG (21) PO TBPK
ORAL_TABLET | ORAL | 0 refills | Status: DC
  Filled 2020-08-04: qty 21, 6d supply, fill #0

## 2020-08-06 ENCOUNTER — Other Ambulatory Visit: Payer: Self-pay | Admitting: Family Medicine

## 2020-08-06 ENCOUNTER — Other Ambulatory Visit (HOSPITAL_COMMUNITY): Payer: Self-pay | Admitting: Nurse Practitioner

## 2020-08-06 ENCOUNTER — Other Ambulatory Visit (HOSPITAL_COMMUNITY): Payer: Self-pay

## 2020-08-06 ENCOUNTER — Other Ambulatory Visit: Payer: Self-pay | Admitting: "Endocrinology

## 2020-08-06 MED ORDER — METFORMIN HCL 500 MG PO TABS
ORAL_TABLET | Freq: Two times a day (BID) | ORAL | 1 refills | Status: DC
  Filled 2020-08-06: qty 180, 90d supply, fill #0
  Filled 2020-10-29: qty 180, 90d supply, fill #1

## 2020-08-06 MED ORDER — ELIQUIS 5 MG PO TABS
ORAL_TABLET | Freq: Two times a day (BID) | ORAL | 3 refills | Status: DC
  Filled 2020-08-06: qty 180, 90d supply, fill #0
  Filled 2020-10-29: qty 180, 90d supply, fill #1
  Filled 2021-02-10: qty 180, 90d supply, fill #2
  Filled 2021-04-18: qty 180, 90d supply, fill #3

## 2020-08-06 MED FILL — Valacyclovir HCl Tab 1 GM: ORAL | 30 days supply | Qty: 30 | Fill #0 | Status: AC

## 2020-08-07 ENCOUNTER — Other Ambulatory Visit (HOSPITAL_COMMUNITY): Payer: Self-pay

## 2020-08-09 ENCOUNTER — Other Ambulatory Visit (HOSPITAL_COMMUNITY): Payer: Self-pay

## 2020-08-11 ENCOUNTER — Other Ambulatory Visit (HOSPITAL_COMMUNITY): Payer: Self-pay

## 2020-08-15 ENCOUNTER — Other Ambulatory Visit (HOSPITAL_COMMUNITY): Payer: Self-pay

## 2020-08-18 ENCOUNTER — Telehealth: Payer: Self-pay | Admitting: Family Medicine

## 2020-08-18 ENCOUNTER — Other Ambulatory Visit (HOSPITAL_COMMUNITY): Payer: Self-pay

## 2020-08-18 MED ORDER — FLUTICASONE PROPIONATE 50 MCG/ACT NA SUSP
NASAL | 3 refills | Status: DC
  Filled 2020-08-18: qty 16, 30d supply, fill #0
  Filled 2020-09-12: qty 16, 60d supply, fill #1
  Filled 2020-11-20: qty 16, 60d supply, fill #2
  Filled 2020-12-24 – 2021-02-10 (×2): qty 16, 60d supply, fill #3

## 2020-08-18 NOTE — Telephone Encounter (Signed)
Refill sent to pharmacy.   

## 2020-08-18 NOTE — Telephone Encounter (Signed)
90 day with 3 refills

## 2020-08-18 NOTE — Telephone Encounter (Signed)
[facility] requesting refill on Flonase nasal spray. Last filled 06/13/19 by historical provider. Please advise. Thank you

## 2020-08-18 NOTE — Addendum Note (Signed)
Addended by: Vicente Males on: 08/18/2020 11:54 AM   Modules accepted: Orders

## 2020-08-29 ENCOUNTER — Other Ambulatory Visit (HOSPITAL_COMMUNITY): Payer: Self-pay

## 2020-08-29 MED FILL — Alprazolam Tab 0.25 MG: ORAL | 30 days supply | Qty: 90 | Fill #0 | Status: AC

## 2020-08-30 ENCOUNTER — Other Ambulatory Visit (HOSPITAL_COMMUNITY): Payer: Self-pay

## 2020-08-30 ENCOUNTER — Other Ambulatory Visit: Payer: Self-pay | Admitting: Family Medicine

## 2020-08-30 MED FILL — Atorvastatin Calcium Tab 20 MG (Base Equivalent): ORAL | 90 days supply | Qty: 90 | Fill #0 | Status: AC

## 2020-08-30 MED FILL — Gabapentin Cap 300 MG: ORAL | 30 days supply | Qty: 120 | Fill #0 | Status: AC

## 2020-09-01 ENCOUNTER — Other Ambulatory Visit (HOSPITAL_COMMUNITY): Payer: Self-pay

## 2020-09-01 MED ORDER — FLUOXETINE HCL 20 MG PO CAPS
ORAL_CAPSULE | Freq: Every day | ORAL | 0 refills | Status: DC
  Filled 2020-09-01: qty 90, 90d supply, fill #0

## 2020-09-01 MED ORDER — FOLIC ACID 1 MG PO TABS
ORAL_TABLET | Freq: Every day | ORAL | 0 refills | Status: DC
  Filled 2020-09-01: qty 90, 90d supply, fill #0

## 2020-09-09 ENCOUNTER — Other Ambulatory Visit (HOSPITAL_COMMUNITY): Payer: Self-pay

## 2020-09-12 ENCOUNTER — Other Ambulatory Visit (HOSPITAL_COMMUNITY): Payer: Self-pay

## 2020-09-17 ENCOUNTER — Other Ambulatory Visit: Payer: Self-pay

## 2020-09-17 ENCOUNTER — Ambulatory Visit (INDEPENDENT_AMBULATORY_CARE_PROVIDER_SITE_OTHER): Payer: No Typology Code available for payment source | Admitting: Family Medicine

## 2020-09-17 ENCOUNTER — Encounter: Payer: Self-pay | Admitting: Family Medicine

## 2020-09-17 VITALS — BP 124/76 | HR 72 | Temp 97.6°F | Ht 73.0 in | Wt 188.0 lb

## 2020-09-17 DIAGNOSIS — M79622 Pain in left upper arm: Secondary | ICD-10-CM | POA: Diagnosis not present

## 2020-09-17 DIAGNOSIS — M79621 Pain in right upper arm: Secondary | ICD-10-CM

## 2020-09-17 DIAGNOSIS — M25511 Pain in right shoulder: Secondary | ICD-10-CM | POA: Diagnosis not present

## 2020-09-17 DIAGNOSIS — S46219D Strain of muscle, fascia and tendon of other parts of biceps, unspecified arm, subsequent encounter: Secondary | ICD-10-CM

## 2020-09-17 DIAGNOSIS — M25512 Pain in left shoulder: Secondary | ICD-10-CM

## 2020-09-17 NOTE — Progress Notes (Signed)
   Subjective:    Patient ID: Travis [name], male    DOB: [birth year], 60 y.o.   MRN: [id number]  HPI Orthopedic consult follow up   Orthopedic consult in care every where Arthalgia R thumb, knees , back chronic Patient with bilateral shoulder pain He was seen by [facility] they did MRI They were recommending physical therapy possibly prednisone Patient wonders if he may need surgery for the biceps tendon rupture Conservative measures were discussed with the patient He would like to get a second opinion which is understandable    Review of Systems     Objective:   Physical Exam  Lungs are clear respiratory rate normal heart regular pulse normal BP good bilateral bicep tendon rupture is noted      Assessment & Plan:  Referral for bilateral bicep tendon rupture [facility] to see him Patient to follow-up here later in the summer for a wellness

## 2020-09-17 NOTE — Patient Instructions (Signed)
Please get your eye check up soon!!   Goal is to check your blood vessel

## 2020-09-24 ENCOUNTER — Other Ambulatory Visit (HOSPITAL_COMMUNITY): Payer: Self-pay

## 2020-09-24 MED FILL — Gabapentin Cap 300 MG: ORAL | 30 days supply | Qty: 120 | Fill #1 | Status: AC

## 2020-09-24 MED FILL — Glucose Blood Test Strip: 90 days supply | Qty: 200 | Fill #0 | Status: AC

## 2020-09-24 MED FILL — Valacyclovir HCl Tab 1 GM: ORAL | 30 days supply | Qty: 30 | Fill #1 | Status: AC

## 2020-09-24 MED FILL — Lancets: 50 days supply | Qty: 100 | Fill #0 | Status: AC

## 2020-09-25 ENCOUNTER — Other Ambulatory Visit: Payer: Self-pay

## 2020-09-25 ENCOUNTER — Ambulatory Visit: Payer: No Typology Code available for payment source | Admitting: Orthopaedic Surgery

## 2020-09-25 ENCOUNTER — Encounter: Payer: Self-pay | Admitting: Orthopaedic Surgery

## 2020-09-25 VITALS — Ht 73.0 in | Wt 188.0 lb

## 2020-09-25 DIAGNOSIS — G8929 Other chronic pain: Secondary | ICD-10-CM | POA: Diagnosis not present

## 2020-09-25 DIAGNOSIS — M19011 Primary osteoarthritis, right shoulder: Secondary | ICD-10-CM

## 2020-09-25 DIAGNOSIS — M25512 Pain in left shoulder: Secondary | ICD-10-CM

## 2020-09-25 DIAGNOSIS — M67911 Unspecified disorder of synovium and tendon, right shoulder: Secondary | ICD-10-CM | POA: Diagnosis not present

## 2020-09-25 NOTE — Progress Notes (Signed)
Office Visit Note   Patient: Travis [name]           Date of Birth: 1961-04-07           MRN: [id number] Visit Date: 09/25/2020              Requested by: Kathyrn Drown, MD [place],  [place] [address] PCP: Kathyrn Drown, MD   Assessment & Plan: Visit Diagnoses:  1. Tendinopathy of right rotator cuff   2. Arthrosis of right acromioclavicular joint   3. Chronic left shoulder pain     Plan: Based on findings I do believe that his shoulders do cause him significant pain.  I was able to review the MRI report of the right shoulder which shows a significant supraspinatus and diffuse rotator cuff tendinopathy.  He has a ruptured biceps tendon and AC joint arthrosis.  We had a lengthy discussion on realistic expectations for eventual shoulder function and pain relief from shoulder arthroscopy and debridement and possible repair based on intraoperative findings.  He also understands that component of his pain comes from his neck.  He will need to have his A1c less than 8.0 before he is a surgical candidate from my standpoint.  He will need to come off Eliquis before we can do surgery once he is ready for surgery.  Risks including but not limited to infection, adhesive capsulitis, incomplete relief of pain reviewed with the patient in detail.  He will call us back when his A1c level is at an acceptable level for surgery.  We will work on getting the MRI reports for both shoulders as well as the discs themselves.  Follow-Up Instructions: Return if symptoms worsen or fail to improve.   Orders:  No orders of the defined types were placed in this encounter.  No orders of the defined types were placed in this encounter.     Procedures: No procedures performed   Clinical Data: No additional findings.   Subjective: Chief Complaint  Patient presents with  . Left Upper Arm - Pain  . Right Upper Arm - Pain    Travis [name] is a very pleasant 60 year old gentleman comes in  for chronic bilateral shoulder pain.  He has pain throughout the shoulder.  He has had bilateral proximal biceps tendon ruptures in the past.  He states that he is difficulty with daily activities.  He also has numbness and tingling in his fingers.  He does have diabetes and he is on Eliquis for atrial fibrillation.  He is right-hand dominant.  He is very active and tries to remain so but the pain has been extremely limiting.  He has trouble sleeping at night.  He was evaluated by Dr. Onnie Graham at [facility] and he felt that the patient's symptoms were more consistent with cervical radiculopathy.  He comes in today for second opinion.   Review of Systems  Constitutional: Negative.   All other systems reviewed and are negative.    Objective: Vital Signs: Ht 6\' 1"  (1.854 m)   Wt 188 lb (85.3 kg)   BMI 24.80 kg/m   Physical Exam Vitals and nursing note reviewed.  Constitutional:      Appearance: He is well-developed.  HENT:     Head: Normocephalic and atraumatic.  Eyes:     Pupils: Pupils are equal, round, and reactive to light.  Pulmonary:     Effort: Pulmonary effort is normal.  Abdominal:     Palpations: Abdomen is soft.  Musculoskeletal:        General: Normal range of motion.     Cervical back: Neck supple.  Skin:    General: Skin is warm.  Neurological:     Mental Status: He is alert and oriented to person, place, and time.  Psychiatric:        Behavior: Behavior normal.        Thought Content: Thought content normal.        Judgment: Judgment normal.     Ortho Exam Right shoulder shows mild Popeye deformity.  He has moderate pain with attempted passive range of motion which is mildly restricted.  Pain with cross body adduction and pain with empty can.  Strength is 4/5.  Positive Hawkins sign.  Left shoulder shows mild Popeye deformity.  He has moderate pain with attempted passive range of motion which is mildly restricted.  Strength is grossly intact to manual muscle  testing. Specialty Comments:  No specialty comments available.  Imaging: No results found.   PMFS History: Patient Active Problem List   Diagnosis Date Noted  . Tendinopathy of right rotator cuff 09/25/2020  . Arthrosis of right acromioclavicular joint 09/25/2020  . Adrenal adenoma, left 09/07/2019  . Aortic atherosclerosis ([place]) 06/26/2019  . Ischemic colitis ([place])   . Chronic anticoagulation   . GI bleed 06/14/2019  . Vitamin D deficiency 04/18/2019  . History of stroke 04/20/2018  . Hypervitaminosis D 07/13/2017  . Chronic pain of right knee 04/14/2017  . GAD (generalized anxiety disorder) 05/15/2016  . Acute bronchiolitis due to respiratory syncytial virus (RSV)   . Acute CVA (cerebrovascular accident) ([place]) 05/04/2016  . Dizziness 05/02/2016  . Anxiety and depression 05/02/2016  . Paroxysmal atrial fibrillation ([place]) 03/02/2016  . PFO (patent foramen ovale) 07/24/2015  . Chronic deep vein thrombosis (DVT) of popliteal vein of left lower extremity ([place]) 07/24/2015  . OSA (obstructive sleep apnea) 04/18/2015  . 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 artery ([place]) 04/18/2015  . Cerebellar stroke ([place]) 02/28/2015  . Snoring 01/23/2015  . Degeneration of cervical intervertebral disc 01/23/2015  . Essential hypertension 12/14/2014  . DM type 2 causing vascular disease ([place]) 12/14/2014  . Mixed hyperlipidemia 12/14/2014  . Neck pain 12/14/2014  . Cerebral infarction, chronic   . Vertigo 10/14/2014  . 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 Impingement syndrome of left shoulder 08/02/2014  . Adhesive capsulitis of left shoulder 08/02/2014  . Abdominal pain 06/13/2014  . Rectal bleeding 03/13/2014  . Diabetes type 2, controlled ([place]) 02/18/2014  . Hyperlipidemia 02/18/2014  . Diabetic peripheral neuropathy ([place]) 03/28/2013  . Irreducible incisional hernia 11/11/2010   Past Medical History:  Diagnosis Date  . A-fib ([place]) 02/2016   found on loop recorder   . Adhesive capsulitis of left shoulder 08/02/2014  . Anxiety   . Arthritis   . Blood transfusion without reported diagnosis   . Chronic headaches   . Complication of anesthesia    bleed after last shoulder-had to stay overnight  . Depression   . Diabetes mellitus   . Diabetic peripheral neuropathy ([place]) 03/28/2013  . Diverticulosis   . Dizziness   . Hernia, inguinal   . Hyperlipidemia   . Hypertension   . Impingement syndrome of left shoulder 08/02/2014  . Ischemic colitis ([place])   . 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 Neuropathy of lower extremity   . Night sweats    every once in a while  . Sleep apnea    no CPAP  . Stroke (  [place]) 02/2015  . Syncope and collapse     Family History  Problem Relation Age of Onset  . Stroke Father   . Hyperlipidemia Father   . Heart attack Sister 42  . Stroke Sister   . Dementia Mother   . ALS Brother        age 62  . Diabetes Maternal Grandfather   . Colon cancer Neg Hx   . Esophageal cancer Neg Hx   . Stomach cancer Neg Hx   . Rectal cancer Neg Hx     Past Surgical History:  Procedure Laterality Date  . BIOPSY  06/14/2019   Procedure: BIOPSY;  Surgeon: [name], MD;  Location: [facility];  Service: Gastroenterology;;  . CERVICAL [place]    . COLON SURGERY  2003   1/3 removed for diverticulitis  . COLONOSCOPY    . COLONOSCOPY WITH PROPOFOL N/A 06/14/2019   Procedure: COLONOSCOPY WITH PROPOFOL;  Surgeon: [name], MD;  [facility];  Service: Gastroenterology;  Laterality: N/A;  . EP IMPLANTABLE DEVICE N/A 05/01/2015   Procedure: Loop Recorder Insertion;  Surgeon: [name], MD;  Location: [facility];  Service: Cardiovascular;  Laterality: N/A;  . INGUINAL HERNIA REPAIR Bilateral   . KNEE ARTHROSCOPY Right   . SHOULDER ARTHROSCOPY Right    1  . SHOULDER ARTHROSCOPY Left 08/02/2014   Procedure: LEFT SHOULDER SCOPE DEBRIDEMENT/ACROMIOPLASTY;  Surgeon: [name], MD;  Location: [facility];  Service: Orthopedics;  Laterality: Left;  ANESTHESIA: GENERAL, PRE/POST OP SCALENE  . TEE WITHOUT CARDIOVERSION N/A 03/03/2015   Procedure: TRANSESOPHAGEAL ECHOCARDIOGRAM (TEE);  Surgeon: [name], MD;  [facility];  Service: Cardiology;  Laterality: N/A;  . UMBILICAL HERNIA REPAIR     with other hernia repair with mesh  . WRIST SURGERY Right    fusion   Social History   Occupational History  . Occupation: disabled  Tobacco Use  . Smoking status: Former Smoker    Packs/day: 1.00    Years: 8.00    Pack years: 8.00    Types: Cigarettes    Start date: 06/07/1970    Quit date: 05/03/1978    Years since quitting: 42.4  . Smokeless tobacco: Former Systems developer    Types: [place] date: 05/03/1978  Vaping Use  . Vaping Use: Never used  Substance and Sexual Activity  . Alcohol use: No    Alcohol/week: 1.0 standard drink    Types: 1 Cans of beer per week    Comment: h/o heavy use in the past  . Drug use: No  . Sexual activity: Not on file

## 2020-09-30 ENCOUNTER — Other Ambulatory Visit (HOSPITAL_COMMUNITY): Payer: Self-pay

## 2020-09-30 MED FILL — Alprazolam Tab 0.25 MG: ORAL | 30 days supply | Qty: 90 | Fill #1 | Status: AC

## 2020-10-03 ENCOUNTER — Other Ambulatory Visit (HOSPITAL_COMMUNITY): Payer: Self-pay

## 2020-10-03 ENCOUNTER — Telehealth: Payer: Self-pay

## 2020-10-03 NOTE — Telephone Encounter (Signed)
Called patient no answer. LMOM to return call.   Per Dr. Erlinda Hong we need to get MRI Report & CD left shoulder & MRI CD of right shoulder.

## 2020-10-08 NOTE — Telephone Encounter (Signed)
Spoke to wife. She will request records.

## 2020-10-16 ENCOUNTER — Telehealth: Payer: Self-pay

## 2020-10-16 DIAGNOSIS — Z029 Encounter for administrative examinations, unspecified: Secondary | ICD-10-CM

## 2020-10-16 NOTE — Telephone Encounter (Signed)
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  I can copy and paste the letter from last year and change the date if that is the easiest thing to do.  Letter is below:   October 24, 2019     Travis [name] [address] [place] [address]     To whom it may concern,     [name] is a patient of our practice.  This patient has significant health related issues which make it impossible for him to care for a young child or toddler.  He is not capable of providing supervision for any children when his wife is at work. He is also at high risk of JARWP-10 complications and therefore can not take care of a young child.

## 2020-10-17 ENCOUNTER — Encounter: Payer: Self-pay | Admitting: Family Medicine

## 2020-10-17 NOTE — Telephone Encounter (Signed)
Letter was dictated.  Please forward to family thank you

## 2020-10-25 ENCOUNTER — Other Ambulatory Visit (HOSPITAL_COMMUNITY): Payer: Self-pay

## 2020-10-25 MED FILL — Insulin Pen Needle 31 G X 6 MM (1/4" or 15/64"): 90 days supply | Qty: 100 | Fill #0 | Status: AC

## 2020-10-25 MED FILL — Insulin Degludec Soln Pen-Injector 100 Unit/ML: SUBCUTANEOUS | 30 days supply | Qty: 30 | Fill #0 | Status: AC

## 2020-10-25 MED FILL — Valacyclovir HCl Tab 1 GM: ORAL | 30 days supply | Qty: 30 | Fill #2 | Status: AC

## 2020-10-29 ENCOUNTER — Other Ambulatory Visit (HOSPITAL_COMMUNITY): Payer: Self-pay

## 2020-10-29 MED FILL — Alprazolam Tab 0.25 MG: ORAL | 30 days supply | Qty: 90 | Fill #2 | Status: AC

## 2020-10-29 MED FILL — Gabapentin Cap 300 MG: ORAL | 30 days supply | Qty: 120 | Fill #2 | Status: AC

## 2020-11-10 ENCOUNTER — Other Ambulatory Visit (HOSPITAL_COMMUNITY): Payer: Self-pay

## 2020-11-10 ENCOUNTER — Other Ambulatory Visit: Payer: Self-pay | Admitting: Nurse Practitioner

## 2020-11-10 DIAGNOSIS — D3502 Benign neoplasm of left adrenal gland: Secondary | ICD-10-CM

## 2020-11-13 ENCOUNTER — Other Ambulatory Visit: Payer: Self-pay

## 2020-11-13 ENCOUNTER — Ambulatory Visit (INDEPENDENT_AMBULATORY_CARE_PROVIDER_SITE_OTHER): Payer: No Typology Code available for payment source | Admitting: Family Medicine

## 2020-11-13 ENCOUNTER — Other Ambulatory Visit (HOSPITAL_COMMUNITY): Payer: Self-pay

## 2020-11-13 ENCOUNTER — Encounter: Payer: Self-pay | Admitting: Family Medicine

## 2020-11-13 VITALS — BP 136/84 | HR 101 | Temp 98.1°F | Ht 73.0 in | Wt 187.0 lb

## 2020-11-13 DIAGNOSIS — F411 Generalized anxiety disorder: Secondary | ICD-10-CM | POA: Diagnosis not present

## 2020-11-13 DIAGNOSIS — Z125 Encounter for screening for malignant neoplasm of prostate: Secondary | ICD-10-CM

## 2020-11-13 DIAGNOSIS — Z0000 Encounter for general adult medical examination without abnormal findings: Secondary | ICD-10-CM

## 2020-11-13 DIAGNOSIS — Z0001 Encounter for general adult medical examination with abnormal findings: Secondary | ICD-10-CM

## 2020-11-13 DIAGNOSIS — E1169 Type 2 diabetes mellitus with other specified complication: Secondary | ICD-10-CM | POA: Diagnosis not present

## 2020-11-13 DIAGNOSIS — E785 Hyperlipidemia, unspecified: Secondary | ICD-10-CM

## 2020-11-13 MED ORDER — ATORVASTATIN CALCIUM 20 MG PO TABS
20.0000 mg | ORAL_TABLET | Freq: Every day | ORAL | 1 refills | Status: DC
  Filled 2020-11-13 – 2020-11-20 (×2): qty 90, 90d supply, fill #0
  Filled 2021-02-10: qty 90, 90d supply, fill #1

## 2020-11-13 MED ORDER — FLUOXETINE HCL 20 MG PO CAPS
20.0000 mg | ORAL_CAPSULE | Freq: Every day | ORAL | 1 refills | Status: DC
  Filled 2020-11-13 – 2020-11-20 (×2): qty 90, 90d supply, fill #0
  Filled 2021-02-10: qty 90, 90d supply, fill #1

## 2020-11-13 MED ORDER — VALACYCLOVIR HCL 1 G PO TABS
1000.0000 mg | ORAL_TABLET | Freq: Every day | ORAL | 1 refills | Status: DC
  Filled 2020-11-13: qty 90, fill #0
  Filled 2020-11-29: qty 90, 90d supply, fill #0
  Filled 2021-02-10: qty 90, 90d supply, fill #1

## 2020-11-13 NOTE — Progress Notes (Signed)
   Subjective:    Patient ID: Travis [name], male    DOB: Dec 02, 1960, 60 y.o.   MRN: [id number]  HPI The patient comes in today for a wellness visit.  Patient states he does try to eat healthy Tries to stay active Has had strokes in the past trying to avoid more Does not smoke or drink   A review of their health history was completed.  A review of medications was also completed.  Any needed refills; update meds  Eating habits: health conscious  Falls/  MVA accidents in past few months: none  Regular exercise: house work and yard work.   Specialist pt sees on regular basis: none  Preventative health issues were discussed.   Additional concerns: none    Review of Systems     Objective:   Physical Exam General-in no acute distress Eyes-no discharge Lungs-respiratory rate normal, CTA CV-no murmurs,RRR Extremities skin warm dry no edema Neuro grossly normal Behavior normal, alert Rectal exam normal       Assessment & Plan:  1. Routine general medical examination at a health care facility Adult wellness-complete.wellness physical was conducted today. Importance of diet and exercise were discussed in detail.  In addition to this a discussion regarding safety was also covered. We also reviewed over immunizations and gave recommendations regarding current immunization needed for age.  In addition to this additional areas were also touched on including: Preventative health exams needed:  Colonoscopy Next due February 2031  Patient was advised yearly wellness exam   2. GAD (generalized anxiety disorder) May use Xanax I encouraged him to try to cut back to just 2/day currently taking 3/day denies drowsiness.  Finds himself feeling anxious a lot.  Patient taking Prozac denies being depressed  3. Hyperlipidemia associated with type 2 diabetes mellitus ([place]) Does take his cholesterol medicine on a regular basis tries to watch his diet  4. Screening for prostate  cancer Screening ordered - PSA

## 2020-11-13 NOTE — Patient Instructions (Addendum)
Please get your eye exam done in the near future  When you get your blood drawn please make sure you get the PSA test as well-we have given you orders today for this.  If all goes well with your health follow-up here in 6 months  Next wellness exam in 1 year   [email or web address]">    DASH Eating Plan DASH stands for Dietary Approaches to Stop Hypertension. The DASH eating plan is a healthy eating plan that has been shown to: Reduce high blood pressure (hypertension). Reduce your risk for type 2 diabetes, heart disease, and stroke. Help with weight loss. What are tips for following this plan? Reading food labels Check food labels for the amount of salt (sodium) per serving. Choose foods with less than 5 percent of the Daily Value of sodium. Generally, foods with less than 300 milligrams (mg) of sodium per serving fit into this eating plan. To find whole grains, look for the word "whole" as the first word in the ingredient list. Shopping Buy products labeled as "low-sodium" or "no salt added." Buy fresh foods. Avoid canned foods and pre-made or frozen meals. Cooking Avoid adding salt when cooking. Use salt-free seasonings or herbs instead of table salt or sea salt. Check with your health care provider or pharmacist before using salt substitutes. Do not fry foods. Cook foods using healthy methods such as baking, boiling, grilling, roasting, and broiling instead. Cook with heart-healthy oils, such as olive, canola, avocado, soybean, or sunflower oil. Meal planning  Eat a balanced diet that includes: 4 or more servings of fruits and 4 or more servings of vegetables each day. Try to fill one-half of your plate with fruits and vegetables. 6-8 servings of whole grains each day. Less than 6 oz (170 g) of lean meat, poultry, or fish each day. A 3-oz (85-g) serving of meat is about the same size as a deck of cards. One egg equals 1 oz (28 g). 2-3  servings of low-fat dairy each day. One serving is 1 cup (237 mL). 1 serving of nuts, seeds, or beans 5 times each week. 2-3 servings of heart-healthy fats. Healthy fats called omega-3 fatty acids are found in foods such as walnuts, flaxseeds, fortified milks, and eggs. These fats are also found in cold-water fish, such as sardines, salmon, and mackerel. Limit how much you eat of: Canned or prepackaged foods. Food that is high in trans fat, such as some fried foods. Food that is high in saturated fat, such as fatty meat. Desserts and other sweets, sugary drinks, and other foods with added sugar. Full-fat dairy products. Do not salt foods before eating. Do not eat more than 4 egg yolks a week. Try to eat at least 2 vegetarian meals a week. Eat more home-cooked food and less restaurant, buffet, and fast food.  Lifestyle When eating at a restaurant, ask that your food be prepared with less salt or no salt, if possible. If you drink alcohol: Limit how much you use to: 0-1 drink a day for women who are not pregnant. 0-2 drinks a day for men. Be aware of how much alcohol is in your drink. In the [place] one drink equals one 12 oz bottle of beer (355 mL), one 5 oz glass of wine (148 mL), or one 1 oz glass of hard liquor (44 mL). General information Avoid eating more than 2,300 mg of salt a day. If you have hypertension, you may need to reduce your sodium intake to 1,500 mg a day.  Work with your health care provider to maintain a healthy body weight or to lose weight. Ask what an ideal weight is for you. Get at least 30 minutes of exercise that causes your heart to beat faster (aerobic exercise) most days of the week. Activities may include walking, swimming, or biking. Work with your health care provider or dietitian to adjust your eating plan to your individual calorie needs. What foods should I eat? Fruits All fresh, dried, or frozen fruit. Canned fruit in natural juice (without  addedsugar). Vegetables Fresh or frozen vegetables (raw, steamed, roasted, or grilled). Low-sodium or reduced-sodium tomato and vegetable juice. Low-sodium or reduced-sodium tomatosauce and tomato paste. Low-sodium or reduced-sodium canned vegetables. Grains Whole-grain or whole-wheat bread. Whole-grain or whole-wheat pasta. Brown rice. [place]. Bulgur. Whole-grain and low-sodium cereals. Pita bread.Low-fat, low-sodium crackers. Whole-wheat flour tortillas. Meats and other proteins Skinless chicken or [place]. Ground chicken or [place]. Pork with fat trimmed off. Fish and seafood. Egg whites. Dried beans, peas, or lentils. Unsalted nuts, nut butters, and seeds. Unsalted canned beans. Lean cuts of beef with fat trimmed off. Low-sodium, lean precooked or cured meat, such as sausages or meatloaves. Dairy Low-fat (1%) or fat-free (skim) milk. Reduced-fat, low-fat, or fat-free cheeses. Nonfat, low-sodium ricotta or cottage cheese. Low-fat or nonfatyogurt. Low-fat, low-sodium cheese. Fats and oils Soft margarine without trans fats. Vegetable oil. Reduced-fat, low-fat, or light mayonnaise and salad dressings (reduced-sodium). Canola, safflower, olive, avocado, soybean, andsunflower oils. Avocado. Seasonings and condiments Herbs. Spices. Seasoning mixes without salt. Other foods Unsalted popcorn and pretzels. Fat-free sweets. The items listed above may not be a complete list of foods and beverages you can eat. Contact a dietitian for more information. What foods should I avoid? Fruits Canned fruit in a light or heavy syrup. Fried fruit. Fruit in cream or buttersauce. Vegetables Creamed or fried vegetables. Vegetables in a cheese sauce. Regular canned vegetables (not low-sodium or reduced-sodium). Regular canned tomato sauce and paste (not low-sodium or reduced-sodium). Regular tomato and vegetable juice(not low-sodium or reduced-sodium). Angie Fava. Olives. Grains Baked goods made with fat, such as  croissants, muffins, or some breads. Drypasta or rice meal packs. Meats and other proteins Fatty cuts of meat. Ribs. Fried meat. Berniece Salines. Bologna, salami, and other precooked or cured meats, such as sausages or meat loaves. Fat from the back of a pig (fatback). Bratwurst. Salted nuts and seeds. Canned beans with added salt. Canned orsmoked fish. Whole eggs or egg yolks. Chicken or [place] with skin. Dairy Whole or 2% milk, cream, and half-and-half. Whole or full-fat cream cheese. Whole-fat or sweetened yogurt. Full-fat cheese. Nondairy creamers. Whippedtoppings. Processed cheese and cheese spreads. Fats and oils Butter. Stick margarine. Lard. Shortening. Ghee. Bacon fat. Tropical oils, suchas coconut, palm kernel, or palm oil. Seasonings and condiments Onion salt, garlic salt, seasoned salt, table salt, and sea salt. Worcestershire sauce. Tartar sauce. Barbecue sauce. Teriyaki sauce. Soy sauce, including reduced-sodium. Steak sauce. Canned and packaged gravies. Fish sauce. Oyster sauce. Cocktail sauce. Store-bought horseradish. Ketchup. Mustard. Meat flavorings and tenderizers. Bouillon cubes. Hot sauces. Pre-made or packaged marinades. Pre-made or packaged taco seasonings. Relishes. Regular saladdressings. Other foods Salted popcorn and pretzels. The items listed above may not be a complete list of foods and beverages you should avoid. Contact a dietitian for more information. Where to find more information National Heart, Lung, and Blood Institute: https://wilson-eaton.com/ American Heart Association: www.heart.org Academy of Nutrition and Dietetics: www.eatright.[place]: www.kidney.org Summary The DASH eating plan is a healthy eating plan that has  been shown to reduce high blood pressure (hypertension). It may also reduce your risk for type 2 diabetes, heart disease, and stroke. When on the DASH eating plan, aim to eat more fresh fruits and vegetables, whole grains, lean proteins,  low-fat dairy, and heart-healthy fats. With the DASH eating plan, you should limit salt (sodium) intake to 2,300 mg a day. If you have hypertension, you may need to reduce your sodium intake to 1,500 mg a day. Work with your health care provider or dietitian to adjust your eating plan to your individual calorie needs. This information is not intended to replace advice given to you by your health care provider. Make sure you discuss any questions you have with your healthcare provider. Document Revised: 03/23/2019 Document Reviewed: 03/23/2019 Elsevier Patient Education  2022 [employer].

## 2020-11-14 LAB — PSA: Prostate Specific Ag, Serum: 1.2 ng/mL (ref 0.0–4.0)

## 2020-11-18 ENCOUNTER — Ambulatory Visit: Payer: No Typology Code available for payment source | Admitting: Nurse Practitioner

## 2020-11-20 ENCOUNTER — Other Ambulatory Visit (HOSPITAL_COMMUNITY): Payer: Self-pay

## 2020-11-20 ENCOUNTER — Other Ambulatory Visit: Payer: Self-pay | Admitting: Family Medicine

## 2020-11-20 ENCOUNTER — Other Ambulatory Visit: Payer: Self-pay | Admitting: Adult Health

## 2020-11-21 ENCOUNTER — Other Ambulatory Visit (HOSPITAL_COMMUNITY): Payer: Self-pay

## 2020-11-21 LAB — ALDOSTERONE, URINE
Aldosterone U,Random: <2.5 ug/L
Aldosterone, 24H Ur: <3 ug/24 hr (ref 0.00–19.00)

## 2020-11-21 MED ORDER — FOLIC ACID 1 MG PO TABS
ORAL_TABLET | Freq: Every day | ORAL | 0 refills | Status: DC
  Filled 2020-11-21: qty 90, 90d supply, fill #0

## 2020-11-22 LAB — CATECHOLAMINES, FRACTIONATED, URINE, 24 HOUR
Dopamine , 24H Ur: 134 ug/24 hr (ref 0–510)
Dopamine, Rand Ur: 103 ug/L
Epinephrine, 24H Ur: 14 ug/24 hr (ref 0–20)
Epinephrine, Rand Ur: 11 ug/L
Norepinephrine, 24H Ur: 47 ug/24 hr (ref 0–135)
Norepinephrine, Rand Ur: 36 ug/L

## 2020-11-22 LAB — CORTISOL, URINE, FREE
Cortisol (Ur), Free: 62 ug/24 hr (ref 5–64)
Cortisol,F,ug/L,U: 48 ug/L

## 2020-11-22 LAB — METANEPHRINES, URINE, 24 HOUR
Metaneph Total, Ur: 105 ug/L
Metanephrines, 24H Ur: 137 ug/24 hr (ref 58–276)
Normetanephrine, 24H Ur: 264 ug/24 hr (ref 156–729)
Normetanephrine, Ur: 203 ug/L

## 2020-11-24 ENCOUNTER — Other Ambulatory Visit (HOSPITAL_COMMUNITY): Payer: Self-pay

## 2020-11-24 MED ORDER — GABAPENTIN 300 MG PO CAPS
ORAL_CAPSULE | ORAL | 6 refills | Status: DC
  Filled 2020-11-24: qty 120, 30d supply, fill #0
  Filled 2021-01-08: qty 120, 30d supply, fill #1
  Filled 2021-02-10: qty 120, 30d supply, fill #2
  Filled 2021-03-21: qty 120, 30d supply, fill #3
  Filled 2021-04-18: qty 120, 30d supply, fill #4
  Filled 2021-05-18: qty 120, 30d supply, fill #5
  Filled 2021-06-20: qty 120, 30d supply, fill #6

## 2020-11-26 NOTE — Patient Instructions (Signed)

## 2020-11-27 ENCOUNTER — Ambulatory Visit (INDEPENDENT_AMBULATORY_CARE_PROVIDER_SITE_OTHER): Payer: No Typology Code available for payment source | Admitting: Nurse Practitioner

## 2020-11-27 ENCOUNTER — Other Ambulatory Visit: Payer: Self-pay

## 2020-11-27 ENCOUNTER — Encounter: Payer: Self-pay | Admitting: Nurse Practitioner

## 2020-11-27 VITALS — BP 142/84 | HR 80 | Ht 73.0 in | Wt 186.2 lb

## 2020-11-27 DIAGNOSIS — E1159 Type 2 diabetes mellitus with other circulatory complications: Secondary | ICD-10-CM | POA: Diagnosis not present

## 2020-11-27 DIAGNOSIS — D3502 Benign neoplasm of left adrenal gland: Secondary | ICD-10-CM

## 2020-11-27 DIAGNOSIS — E559 Vitamin D deficiency, unspecified: Secondary | ICD-10-CM | POA: Diagnosis not present

## 2020-11-27 DIAGNOSIS — E782 Mixed hyperlipidemia: Secondary | ICD-10-CM | POA: Diagnosis not present

## 2020-11-27 LAB — POCT GLYCOSYLATED HEMOGLOBIN (HGB A1C): HbA1c, POC (controlled diabetic range): 7.1 % — AB (ref 0.0–7.0)

## 2020-11-27 NOTE — Progress Notes (Signed)
11/27/2020, 11:57 AM              Endocrinology follow-up note    Subjective:    Patient ID: Travis [name], male    DOB: Sep 26, 1960.  Travis [name] is being seen in follow-up  for management of currently uncontrolled symptomatic diabetes requested by  Kathyrn Drown, [name].   Past Medical History:  Diagnosis Date   A-fib ([place]) 02/2016   found on loop recorder   Adhesive capsulitis of left shoulder 08/02/2014   Anxiety    Arthritis    Blood transfusion without reported diagnosis    Chronic headaches    Complication of anesthesia    bleed after last shoulder-had to stay overnight   Depression    Diabetes mellitus    Diabetic peripheral neuropathy ([place]) 03/28/2013   Diverticulosis    Dizziness    Hernia, inguinal    Hyperlipidemia    Hypertension    Impingement syndrome of left shoulder 08/02/2014   Ischemic colitis ([place])    Multi-infarct state 10/14/2014   Neuropathy of lower extremity    Night sweats    every once in a while   Sleep apnea    no CPAP   Stroke ([place]) 02/2015   Syncope and collapse    Past Surgical History:  Procedure Laterality Date   BIOPSY  06/14/2019   Procedure: BIOPSY;  Surgeon: [name], [name];  Location: [facility];  Service: Gastroenterology;;   CERVICAL [place]  2003   1/3 removed for diverticulitis   COLONOSCOPY     COLONOSCOPY WITH PROPOFOL N/A 06/14/2019   Procedure: COLONOSCOPY WITH PROPOFOL;  Surgeon: [name], [name];  Location: [facility];  Service: Gastroenterology;  Laterality: N/A;   EP IMPLANTABLE DEVICE N/A 05/01/2015   Procedure: Loop Recorder Insertion;  Surgeon: [name], [name];  Location: [facility];  Service: Cardiovascular;  Laterality: N/A;   INGUINAL HERNIA REPAIR Bilateral    KNEE ARTHROSCOPY Right    SHOULDER ARTHROSCOPY Right    1   SHOULDER ARTHROSCOPY Left 08/02/2014   Procedure: LEFT SHOULDER SCOPE  DEBRIDEMENT/ACROMIOPLASTY;  Surgeon: [name], [name];  Location: [facility];  Service: Orthopedics;  Laterality: Left;  ANESTHESIA: GENERAL, PRE/POST OP SCALENE   TEE WITHOUT CARDIOVERSION N/A 03/03/2015   Procedure: TRANSESOPHAGEAL ECHOCARDIOGRAM (TEE);  Surgeon: [name], [name];  Location: [facility];  Service: Cardiology;  Laterality: N/A;   UMBILICAL HERNIA REPAIR     with other hernia repair with mesh   WRIST SURGERY Right    fusion   Social History   Socioeconomic History   Marital status: Married    Spouse name: Not on file   Number of children: 2   Years of education: College   Highest education level: Not on file  Occupational History   Occupation: disabled  Tobacco Use   Smoking status: Former    Packs/day: 1.00    Years: 8.00    Pack years: 8.00    Types: Cigarettes    Start date: 06/07/1970    Quit date: 05/03/1978    Years since quitting: 42.6   Smokeless tobacco: Former  Types: Sarina Ser    Quit date: 05/03/1978  Vaping Use   Vaping Use: Never used  Substance and Sexual Activity   Alcohol use: No    Alcohol/week: 1.0 standard drink    Types: 1 Cans of beer per week    Comment: h/o heavy use in the past   Drug use: No   Sexual activity: Not on file  Other Topics Concern   Not on file  Social History Narrative   Drinks some coffee, Drink diet sodas and tea   Social Determinants of Health   Financial Resource Strain: Not on file  Food Insecurity: Not on file  Transportation Needs: Not on file  Physical Activity: Not on file  Stress: Not on file  Social Connections: Not on file   Outpatient Encounter Medications as of 11/27/2020  Medication Sig   ALPRAZolam (XANAX) 0.25 MG tablet TAKE 1 TABLET BY MOUTH 3 TIMES DAILY AS NEEDED   apixaban (ELIQUIS) 5 MG TABS tablet TAKE 1 TABLET BY MOUTH 2 TIMES DAILY.   atorvastatin (LIPITOR) 20 MG tablet Take 1 tablet (20 mg total) by mouth daily.   Blood Glucose Monitoring Suppl (BLOOD GLUCOSE  MONITOR SYSTEM) w/Device KIT Test glucose 2 time daily.  Meter/strips/lancets. Please dispense per patient/insurance preferrence   Blood Glucose Monitoring Suppl (FREESTYLE LITE) w/Device KIT USE AS DIRECTED 2 TIMES DAILY   Cholecalciferol 50 MCG (2000 UT) CAPS Take 1 capsule (2,000 Units total) by mouth daily with breakfast.   FLUoxetine (PROZAC) 20 MG capsule Take 1 capsule (20 mg total) by mouth daily.   fluticasone (FLONASE) 50 MCG/ACT nasal spray Place 1 spray into both nostrils daily.   folic acid (FOLVITE) 1 MG tablet TAKE 1 TABLET BY MOUTH ONCE A DAY   gabapentin (NEURONTIN) 300 MG capsule TAKE 1 CAPSULE BY MOUTH IN THE MORNING, 1 CAPSULE BY MOUTH IN THE AFTERNOON THEN 2 CAPSULES NIGHTLY   glucose blood test strip USE AS DIRECTED TO TEST BLOOD SUGAR 2 TIMES DAILY (Patient taking differently: USE AS DIRECTED TO TEST BLOOD SUGAR 2 TIMES DAILY)   hyoscyamine (LEVSIN SL) 0.125 MG SL tablet Place 1 tablet (0.125 mg total) under the tongue daily before breakfast.   insulin degludec (TRESIBA) 100 UNIT/ML FlexTouch Pen INJECT 50 UNITS INTO THE SKIN AT BEDTIME.   Insulin Pen Needle 31G X 6 MM MISC USE DAILY AS DIRECTED   Lancets (FREESTYLE) lancets USE AS DIRECTED 2 TIMES DAILY TO CHECK BLOOD SUGAR (Patient taking differently: USE AS DIRECTED 2 TIMES DAILY TO CHECK BLOOD SUGAR)   loratadine (CLARITIN) 10 MG tablet TAKE 1 TABLET BY MOUTH DAILY   metFORMIN (GLUCOPHAGE) 500 MG tablet TAKE 1 TABLET BY MOUTH TWO TIMES DAILY WITH A MEAL   Multiple Vitamin (MULTIVITAMIN WITH MINERALS) TABS tablet Take 1 tablet daily by mouth.   valACYclovir (VALTREX) 1000 MG tablet Take 1 tablet (1,000 mg total) by mouth daily.   [DISCONTINUED] Insulin Glargine (BASAGLAR KWIKPEN) 100 UNIT/ML INJECT 50 UNITS INTO THE SKIN AT BEDTIME   No facility-administered encounter medications on file as of 11/27/2020.    ALLERGIES: Allergies  Allergen Reactions   Lisinopril Cough    Patient/spouse is not aware/familiar with why  this is listed as an allergy Not a true allergy but on this list to avoid ACE inhibitors-SA Travis [name]    VACCINATION STATUS: Immunization History  Administered Date(s) Administered   Influenza Split 03/28/2013   Influenza,inj,Quad PF,6+ Mos 02/18/2014, 01/23/2015, 02/06/2016, 02/23/2017, 02/24/2018, 01/31/2020   Influenza-Unspecified 01/02/2011, 02/09/2019  Moderna Sars-Covid-2 Vaccination 07/21/2019, 08/22/2019, 05/01/2020   Pneumococcal Polysaccharide-23 01/31/2010, 03/01/2015   Tdap 02/21/2003, 02/18/2014   Zoster Recombinat (Shingrix) 04/19/2019, 06/29/2019    Diabetes He presents for his follow-up diabetic visit. He has type 2 diabetes mellitus. Onset time: He was diagnosed at approximate age of 23 years. His disease course has been improving (He has prior history of heavy alcohol use/abuse.). Hypoglycemia symptoms include nervousness/anxiousness, sweats and tremors. Pertinent negatives for hypoglycemia include no confusion, headaches, pallor or seizures. Associated symptoms include foot paresthesias. Pertinent negatives for diabetes include no chest pain, no fatigue, no polydipsia, no polyphagia, no polyuria and no weakness. There are no hypoglycemic complications. Symptoms are stable. Diabetic complications include a CVA, nephropathy and peripheral neuropathy. Risk factors for coronary artery disease include dyslipidemia, diabetes mellitus, hypertension, family history, male sex, tobacco exposure and sedentary lifestyle. Current diabetic treatment includes insulin injections and oral agent (monotherapy). He is compliant with treatment most of the time. His weight is stable. He is following a generally unhealthy diet. When asked about meal planning, he reported none. He has had a previous visit with a dietitian. He participates in exercise intermittently. His home blood glucose trend is decreasing steadily. His overall blood glucose range is 110-130 mg/dl. (He presents today with his meter,  no logs, showing tight fasting and near target postprandial glycemic profile.  His POCT A1c today is 7.1%, improving from last visit of 8.2%.  He does have some fasting hypoglycemia reported, usually due to the timing of his breakfast.  ) An ACE inhibitor/angiotensin II receptor blocker is not being taken. He does not see a podiatrist.Eye exam is current.  Hyperlipidemia This is a chronic problem. The current episode started more than 1 year ago. The problem is controlled. Recent lipid tests were reviewed and are normal. Exacerbating diseases include chronic renal disease and diabetes. Factors aggravating his hyperlipidemia include smoking. Pertinent negatives include no chest pain, myalgias or shortness of breath. Current antihyperlipidemic treatment includes statins. The current treatment provides moderate improvement of lipids. There are no compliance problems.  Risk factors for coronary artery disease include dyslipidemia, diabetes mellitus, hypertension, male sex, family history and a sedentary lifestyle.  Hypertension This is a chronic problem. The current episode started more than 1 year ago. The problem has been gradually improving since onset. The problem is controlled. Associated symptoms include sweats. Pertinent negatives include no chest pain, headaches, neck pain, palpitations or shortness of breath. There are no associated agents to hypertension. Risk factors for coronary artery disease include dyslipidemia, diabetes mellitus, family history, male gender, sedentary lifestyle and smoking/tobacco exposure. Past treatments include nothing. Compliance problems include diet.  Hypertensive end-organ damage includes kidney disease, CAD/MI and CVA. Identifiable causes of hypertension include chronic renal disease and sleep apnea.   Review of systems  Constitutional: + Minimally fluctuating body weight,  current Body mass index is 24.57 kg/m. , no fatigue, no subjective hyperthermia, no subjective  hypothermia Eyes: no blurry vision, no xerophthalmia ENT: no sore throat, no nodules palpated in throat, no dysphagia/odynophagia, no hoarseness Cardiovascular: no chest pain, no shortness of breath, no palpitations, no leg swelling Respiratory: no cough, no shortness of breath Gastrointestinal: no nausea/vomiting/diarrhea Musculoskeletal: no muscle/joint aches,  Skin: no rashes, no hyperemia Neurological: no tremors, + numbness/tingling to BLE, no dizziness Psychiatric: no depression, no anxiety  Objective:    BP (!) 142/84   Pulse 80   Ht _0  (1.854 m)   Wt 186 lb 3.2 oz (84.5 kg)   BMI  24.57 kg/m   Wt Readings from Last 3 Encounters:  11/27/20 186 lb 3.2 oz (84.5 kg)  11/13/20 187 lb (84.8 kg)  09/25/20 188 lb (85.3 kg)    BP Readings from Last 3 Encounters:  11/27/20 (!) 142/84  11/13/20 136/84  09/17/20 124/76     Physical Exam- Limited  Constitutional:  Body mass index is 24.57 kg/m. , not in acute distress, + inattentive, hyperactive state of mind with rapid/pressured speech Eyes:  EOMI, no exophthalmos Neck: Supple Cardiovascular: RRR, no murmurs, rubs, or gallops, no edema Respiratory: Adequate breathing efforts, no crackles, rales, rhonchi, or wheezing Musculoskeletal: no gross deformities, strength intact in all four extremities, no gross restriction of joint movements Skin:  no rashes, no hyperemia Neurological: no tremor with outstretched hands      CMP     Component Value Date/Time   NA 140 07/11/2020 1114   K 4.4 07/11/2020 1114   CL 105 07/11/2020 1114   CO2 21 07/11/2020 1114   GLUCOSE 164 (H) 07/11/2020 1114   GLUCOSE 217 (H) 06/16/2019 0520   BUN 10 07/11/2020 1114   CREATININE 0.98 07/11/2020 1114   CREATININE 0.82 02/16/2014 1053   CALCIUM 8.9 07/11/2020 1114   PROT 6.9 07/11/2020 1114   ALBUMIN 4.5 07/11/2020 1114   AST 22 07/11/2020 1114   ALT 9 07/11/2020 1114   ALKPHOS 75 07/11/2020 1114   BILITOT 0.6 07/11/2020 1114    GFRNONAA 80 05/30/2020 1156   GFRAA 92 05/30/2020 1156     Diabetic Labs (most recent): Lab Results  Component Value Date   HGBA1C 7.1 (A) 11/27/2020   HGBA1C 8.2 (A) 07/15/2020   HGBA1C 7.2 (A) 03/12/2020     Lipid Panel ( most recent) Lipid Panel     Component Value Date/Time   CHOL 103 07/11/2020 1114   TRIG 56 07/11/2020 1114   HDL 33 (L) 07/11/2020 1114   CHOLHDL 3.1 07/11/2020 1114   CHOLHDL 3.2 05/03/2016 0545   VLDL 16 05/03/2016 0545   LDLCALC 57 07/11/2020 1114      Lab Results  Component Value Date   TSH 2.120 12/07/2019   TSH 2.12 12/07/2019   TSH 1.960 03/12/2017   TSH 1.460 10/07/2016   TSH 0.720 05/02/2016   TSH 1.895 10/14/2014   FREET4 1.19 12/07/2019   FREET4 1.21 10/07/2016      Assessment & Plan:   1) DM type 2 causing vascular disease ([place])  - Travis [name] has currently uncontrolled symptomatic type 2 DM since  60 years of age. He reports near target fasting glycemic profile and slightly above target postprandial blood glucose readings.    He presents today with his meter, no logs, showing tight fasting and near target postprandial glycemic profile.  His POCT A1c today is 7.1%, improving from last visit of 8.2%.  He does have some fasting hypoglycemia reported, usually due to the timing of his breakfast.    -his diabetes is complicated by CVA, history of smoking, history of heavy alcohol use/abuse in the past and Travis [name] remains at a high risk for more acute and chronic complications which include CAD, CVA, CKD, retinopathy, and neuropathy. These are all discussed in detail with the patient.  - Nutritional counseling repeated at each appointment due to patients tendency to fall back in to old habits.  - The patient admits there is a room for improvement in their diet and drink choices. -  Suggestion is made for the patient to avoid simple  carbohydrates from their diet including Cakes, Sweet Desserts / Pastries, Ice Cream, Soda  (diet and regular), Sweet Tea, Candies, Chips, Cookies, Sweet Pastries, Store Bought Juices, Alcohol in Excess of 1-2 drinks a day, Artificial Sweeteners, Coffee Creamer, and "Sugar-free" Products. This will help patient to have stable blood glucose profile and potentially avoid unintended weight gain.   - I encouraged the patient to switch to unprocessed or minimally processed complex starch and increased protein intake (animal or plant source), fruits, and vegetables.   - Patient is advised to stick to a routine mealtimes to eat 3 meals a day and avoid unnecessary snacks (to snack only to correct hypoglycemia).  - I have approached him with the following individualized plan to manage diabetes and patient agrees:   - There could be a component of pancreatic endocrine and exocrine insufficiency given his prior history of heavy alcohol use.  Therefore, he may need multiple daily injections of insulin to control diabetes in the near future.  -#1 priority in this patient is to avoid hypoglycemia.    -Based on his tight fasting glucose readings, he is advised to lower his dose of Tresiba to 45 units SQ nightly.  He can continue his Metformin 500 mg po twice daily with meals.   -He is advised to monitor blood glucose twice daily, before breakfast and before bed, and call the clinic if he has readings less than 70 or greater than 300 for 3 tests in a row.  -Patient is not a candidate for SGLT2 inhibitors, nor incretin therapy due to his body habitus.  - Patient specific target  A1c;  LDL, HDL, Triglycerides, and  Waist Circumference were discussed in detail.  2) BP/HTN:   His blood pressure is slightly elevated today.  He is not on any antihypertensive medications at this time.  He will be considered for low dose ARB on subsequent visits if BP becomes elevated over 140/90.  3) Lipids/HPL:  His most recent lipid panel from 07/11/20 shows controlled LDL at 57.  He is advised to continue Lipitor 20 mg  po daily at bedtime.  Side effects and precautions discussed with him.  4) Left adrenal adenoma- During a past hospitalization for GI bleed from ischemic colitis a CT scan showed incidental finding of 2.1 cm left adrenal adenoma.  Subsequent plasma metanephrines were within normal limits, considered nonfunctional.  He will not need intervention for it at this time.  He is due for repeat labs.  His 24-hr urine metanephrines, catecholamines, cortisol, and aldosterone were all normal favoring benignity.    5)  Weight/Diet: His Body mass index is 24.57 kg/m.  He is not a weight loss candidate.  CDE Consult  is in progress , exercise, and detailed carbohydrates information provided.  If he experiences unintentional weight loss, he will be considered for Creon therapy.   6) Chronic Care/Health Maintenance: -he is on Statin medications and  is encouraged to continue to follow up with Ophthalmology, Dentist,  Podiatrist at least yearly or according to recommendations, and advised to  stay away from smoking. I have recommended yearly flu vaccine and pneumonia vaccination at least every 5 years; moderate intensity exercise for up to 150 minutes weekly; and  sleep for at least 7 hours a day.  - I advised patient to maintain close follow up with Kathyrn Drown, [name] for primary care needs.    I spent 42 minutes in the care of the patient today including review of labs from CMP, Lipids, Thyroid  Function, Hematology (current and previous including abstractions from other facilities); face-to-face time discussing  his blood glucose readings/logs, discussing hypoglycemia and hyperglycemia episodes and symptoms, medications doses, his options of short and long term treatment based on the latest standards of care / guidelines;  discussion about incorporating lifestyle medicine;  and documenting the encounter.    Please refer to Patient Instructions for Blood Glucose Monitoring and Insulin/Medications Dosing Guide"   in media tab for additional information. Please  also refer to " Patient Self Inventory" in the Media  tab for reviewed elements of pertinent patient history.  Travis [name] participated in the discussions, expressed understanding, and voiced agreement with the above plans.  All questions were answered to his satisfaction. he is encouraged to contact clinic should he have any questions or concerns prior to his return visit.  Follow up plan: - Return in about 4 months (around 03/30/2021) for Diabetes F/U with A1c in office, No previsit labs, Bring meter and logs.  Travis [name], [facility] [address] [place], [place] [address] Phone: [phone] Fax: [phone]  11/27/2020, 11:57 AM

## 2020-11-28 ENCOUNTER — Other Ambulatory Visit (HOSPITAL_COMMUNITY): Payer: Self-pay

## 2020-11-28 MED ORDER — OMRON 3 SERIES BP MONITOR DEVI
0 refills | Status: AC
  Filled 2020-11-28: qty 1, 1d supply, fill #0

## 2020-11-28 MED FILL — Insulin Degludec Soln Pen-Injector 100 Unit/ML: SUBCUTANEOUS | 30 days supply | Qty: 15 | Fill #1 | Status: AC

## 2020-11-28 MED FILL — Alprazolam Tab 0.25 MG: ORAL | 30 days supply | Qty: 90 | Fill #3 | Status: AC

## 2020-11-29 ENCOUNTER — Other Ambulatory Visit (HOSPITAL_COMMUNITY): Payer: Self-pay

## 2020-12-08 ENCOUNTER — Other Ambulatory Visit (HOSPITAL_COMMUNITY): Payer: Self-pay

## 2020-12-12 ENCOUNTER — Other Ambulatory Visit (HOSPITAL_COMMUNITY): Payer: Self-pay

## 2020-12-12 ENCOUNTER — Other Ambulatory Visit: Payer: Self-pay | Admitting: "Endocrinology

## 2020-12-12 DIAGNOSIS — E1159 Type 2 diabetes mellitus with other circulatory complications: Secondary | ICD-10-CM

## 2020-12-15 ENCOUNTER — Other Ambulatory Visit (HOSPITAL_COMMUNITY): Payer: Self-pay

## 2020-12-15 MED ORDER — ACCU-CHEK FASTCLIX LANCETS MISC
2 refills | Status: DC
  Filled 2020-12-15: qty 204, 90d supply, fill #0
  Filled 2021-07-18: qty 204, 90d supply, fill #1

## 2020-12-16 ENCOUNTER — Telehealth: Payer: Self-pay | Admitting: Family Medicine

## 2020-12-16 NOTE — Telephone Encounter (Signed)
Please advise. Thank you

## 2020-12-16 NOTE — Telephone Encounter (Signed)
Patient is needing a letter for jury duty . Copy is in your folder. Need back to fax over to court house.

## 2020-12-17 NOTE — Telephone Encounter (Signed)
Please clarify with the patient that it is okay to put on the letter that he has had a stroke  Or at least have patient let us know what we are allowed to put on the letter(medical information is private and is under the discretion of the patient what to be released or put in a letter regarding jury duty)

## 2020-12-19 ENCOUNTER — Encounter: Payer: Self-pay | Admitting: Family Medicine

## 2020-12-19 NOTE — Telephone Encounter (Signed)
Wife(DPR) stated that they were fine with [name] discussing his history of stroke and inability to perform jury duty

## 2020-12-19 NOTE — Telephone Encounter (Signed)
Letter was dictated, form was completed.  Thank you please get both to the family thank you

## 2020-12-24 ENCOUNTER — Other Ambulatory Visit: Payer: Self-pay | Admitting: Family Medicine

## 2020-12-24 ENCOUNTER — Other Ambulatory Visit (HOSPITAL_COMMUNITY): Payer: Self-pay

## 2020-12-24 MED ORDER — ALPRAZOLAM 0.25 MG PO TABS
ORAL_TABLET | Freq: Three times a day (TID) | ORAL | 3 refills | Status: DC | PRN
  Filled 2020-12-24: qty 90, fill #0
  Filled 2021-01-08: qty 90, 30d supply, fill #0
  Filled 2021-02-10: qty 90, 30d supply, fill #1
  Filled 2021-03-12: qty 90, 30d supply, fill #2
  Filled 2021-04-18: qty 90, 30d supply, fill #3

## 2020-12-25 ENCOUNTER — Other Ambulatory Visit: Payer: Self-pay

## 2020-12-25 ENCOUNTER — Ambulatory Visit (INDEPENDENT_AMBULATORY_CARE_PROVIDER_SITE_OTHER): Payer: No Typology Code available for payment source | Admitting: Family Medicine

## 2020-12-25 VITALS — BP 132/78 | HR 72 | Temp 97.3°F | Ht 73.0 in | Wt 189.0 lb

## 2020-12-25 DIAGNOSIS — E785 Hyperlipidemia, unspecified: Secondary | ICD-10-CM

## 2020-12-25 DIAGNOSIS — E1121 Type 2 diabetes mellitus with diabetic nephropathy: Secondary | ICD-10-CM

## 2020-12-25 DIAGNOSIS — E1169 Type 2 diabetes mellitus with other specified complication: Secondary | ICD-10-CM | POA: Insufficient documentation

## 2020-12-25 NOTE — Progress Notes (Signed)
   Subjective:    Patient ID: Travis [name], male    DOB: April 20, 1961, 60 y.o.   MRN: [id number]  HPI Diabetes, lipids, gad Patient under a fair amount of stress recently lost his wife's oldest son.  Patient states he finds himself feeling nervous and anxious currently taking a small dose of Xanax 3 times daily denies drowsiness States he is trying to do well with his diabetes and cholesterol Denies being depressed Earlier we had talked about tapering down on the Xanax to twice a day but he does not feel that is possible currently  Review of Systems     Objective:   Physical Exam General-in no acute distress Eyes-no discharge Lungs-respiratory rate normal, CTA CV-no murmurs,RRR Extremities skin warm dry no edema Neuro grossly normal Behavior normal, alert        Assessment & Plan:   1. Controlled type 2 diabetes mellitus with diabetic nephropathy, without long-term current use of insulin ([place]) Diabetes under good control with specialist continue to follow through with them  2. Hyperlipidemia associated with type 2 diabetes mellitus ([place]) Taking his medications appropriately  Underlying anxiety utilizing Xanax 3 times daily no need to go up on the dose currently  Patient should follow-up in 4 months to see how things are going

## 2021-01-08 ENCOUNTER — Other Ambulatory Visit (HOSPITAL_COMMUNITY): Payer: Self-pay

## 2021-01-08 MED FILL — Insulin Degludec Soln Pen-Injector 100 Unit/ML: SUBCUTANEOUS | 30 days supply | Qty: 15 | Fill #2 | Status: AC

## 2021-01-14 ENCOUNTER — Ambulatory Visit: Payer: No Typology Code available for payment source | Admitting: Adult Health

## 2021-01-19 ENCOUNTER — Other Ambulatory Visit (HOSPITAL_COMMUNITY): Payer: Self-pay

## 2021-01-19 ENCOUNTER — Other Ambulatory Visit: Payer: Self-pay | Admitting: "Endocrinology

## 2021-01-19 MED ORDER — UNIFINE PENTIPS 31G X 6 MM MISC
1 refills | Status: DC
  Filled 2021-01-19: qty 100, 90d supply, fill #0
  Filled 2021-05-18: qty 100, 90d supply, fill #1

## 2021-01-24 ENCOUNTER — Other Ambulatory Visit (HOSPITAL_COMMUNITY): Payer: Self-pay

## 2021-01-24 ENCOUNTER — Other Ambulatory Visit: Payer: Self-pay | Admitting: Nurse Practitioner

## 2021-01-26 ENCOUNTER — Other Ambulatory Visit (HOSPITAL_COMMUNITY): Payer: Self-pay

## 2021-01-26 MED ORDER — METFORMIN HCL 500 MG PO TABS
ORAL_TABLET | Freq: Two times a day (BID) | ORAL | 1 refills | Status: DC
  Filled 2021-01-26: qty 180, 90d supply, fill #0
  Filled 2021-04-18: qty 180, 90d supply, fill #1

## 2021-02-03 ENCOUNTER — Encounter: Payer: Self-pay | Admitting: Adult Health

## 2021-02-03 ENCOUNTER — Ambulatory Visit: Payer: No Typology Code available for payment source | Admitting: Adult Health

## 2021-02-03 ENCOUNTER — Other Ambulatory Visit: Payer: Self-pay

## 2021-02-03 VITALS — BP 129/79 | HR 85 | Ht 73.0 in | Wt 188.0 lb

## 2021-02-03 DIAGNOSIS — M5412 Radiculopathy, cervical region: Secondary | ICD-10-CM | POA: Diagnosis not present

## 2021-02-03 DIAGNOSIS — Z8673 Personal history of transient ischemic attack (TIA), and cerebral infarction without residual deficits: Secondary | ICD-10-CM | POA: Diagnosis not present

## 2021-02-03 NOTE — Progress Notes (Signed)
[facility]  PATIENT: Travis [name] DOB: 10/04/1960   REASON FOR VISIT: Follow-up for history of stroke HISTORY FROM: Patient  Chief complaint: Chief Complaint  Patient presents with   Follow-up    RM 3 alone  Pt is well and stable on stroke standpoint.     HISTORY OF PRESENT ILLNESS:   Travis [name] is a 60 y.o. Caucasian male with PMHx significant for multiple prior strokes, atrial fibrillation on Eliquis (dx 2017 per [facility]), hx of TBI, HTN, HLD, DM, chronic lumbar spinal stenosis, chronic cervical pain  s/p fusion, hx of chronic LLE DVT, hx of GI bleed 2/2 ischemic colitis 2021, episodic vertigo/dizziness and OSA with CPAP noncompliance/intolerance.  He was initially referred to this office in 2016 evaluated by Dr. Erlinda Hong for dizzy spells and history of prior strokes.  Today, 02/03/2021, Mr. Helms returns for 6 months stroke follow-up unaccompanied.  Overall stable from stroke standpoint.  Denies new stroke/TIA symptoms.  Compliant on Eliquis and atorvastatin without side effects.  Blood pressure today 129/79.  Recent A1c 7.1 (down from 8.2).  Routinely followed by PCP and endocrinology.  Remains on gabapentin for chronic nerve pain tolerating without side effects.  He plans on undergoing bilateral shoulder surgery - he is currently waiting to schedule a follow up with Dr. Erlinda Hong ortho to further discuss now that his A1c has been improving  No new concerns at this time       REVIEW OF SYSTEMS: Full 14 system review of systems performed and notable only for those listed, all others are neg:  those listed in HPI     ALLERGIES: Allergies  Allergen Reactions   Lisinopril Cough    Patient/spouse is not aware/familiar with why this is listed as an allergy Not a true allergy but on this list to avoid ACE inhibitors-SA Luking MD    HOME MEDICATIONS: Outpatient Medications Prior to Visit  Medication Sig Dispense Refill   Accu-Chek FastClix Lancets MISC  USE TO CHECK BLOOD SUGAR 2 TIMES A DAY 204 each 2   ALPRAZolam (XANAX) 0.25 MG tablet TAKE 1 TABLET BY MOUTH 3 TIMES DAILY AS NEEDED 90 tablet 3   apixaban (ELIQUIS) 5 MG TABS tablet TAKE 1 TABLET BY MOUTH 2 TIMES DAILY. 180 tablet 3   atorvastatin (LIPITOR) 20 MG tablet Take 1 tablet (20 mg total) by mouth daily. 90 tablet 1   Blood Glucose Monitoring Suppl (BLOOD GLUCOSE MONITOR SYSTEM) w/Device KIT Test glucose 2 time daily.  Meter/strips/lancets. Please dispense per patient/insurance preferrence 1 each 5   Blood Glucose Monitoring Suppl (FREESTYLE LITE) w/Device KIT USE AS DIRECTED 2 TIMES DAILY 1 kit 0   Blood Pressure Monitoring (OMRON 3 SERIES BP MONITOR) DEVI USE AS DIRECTED. 1 each 0   Cholecalciferol 50 MCG (2000 UT) CAPS Take 1 capsule (2,000 Units total) by mouth daily with breakfast. 30 each 6   FLUoxetine (PROZAC) 20 MG capsule Take 1 capsule (20 mg total) by mouth daily. 90 capsule 1   fluticasone (FLONASE) 50 MCG/ACT nasal spray Place 1 spray into both nostrils daily. 16 g 3   folic acid (FOLVITE) 1 MG tablet TAKE 1 TABLET BY MOUTH ONCE A DAY 90 tablet 0   gabapentin (NEURONTIN) 300 MG capsule TAKE 1 CAPSULE BY MOUTH IN THE MORNING, 1 CAPSULE BY MOUTH IN THE AFTERNOON THEN 2 CAPSULES NIGHTLY 120 capsule 6   glucose blood test strip USE AS DIRECTED TO TEST BLOOD SUGAR 2 TIMES DAILY (Patient taking differently: USE  AS DIRECTED TO TEST BLOOD SUGAR 2 TIMES DAILY) 200 strip 2   hyoscyamine (LEVSIN SL) 0.125 MG SL tablet Place 1 tablet (0.125 mg total) under the tongue daily before breakfast. 30 tablet 6   insulin degludec (TRESIBA) 100 UNIT/ML FlexTouch Pen INJECT 50 UNITS INTO THE SKIN AT BEDTIME. 30 mL 3   Insulin Pen Needle (UNIFINE PENTIPS) 31G X 6 MM MISC USE DAILY AS DIRECTED 100 each 1   Lancets (FREESTYLE) lancets USE AS DIRECTED 2 TIMES DAILY TO CHECK BLOOD SUGAR (Patient taking differently: USE AS DIRECTED 2 TIMES DAILY TO CHECK BLOOD SUGAR) 200 each 2   loratadine (CLARITIN) 10  MG tablet TAKE 1 TABLET BY MOUTH DAILY 90 tablet 2   metFORMIN (GLUCOPHAGE) 500 MG tablet TAKE 1 TABLET BY MOUTH TWO TIMES DAILY WITH A MEAL 180 tablet 1   Multiple Vitamin (MULTIVITAMIN WITH MINERALS) TABS tablet Take 1 tablet daily by mouth.     valACYclovir (VALTREX) 1000 MG tablet Take 1 tablet (1,000 mg total) by mouth daily. 90 tablet 1   No facility-administered medications prior to visit.    PAST MEDICAL HISTORY: Past Medical History:  Diagnosis Date   A-fib ([place]) 02/2016   found on loop recorder   Adhesive capsulitis of left shoulder 08/02/2014   Anxiety    Arthritis    Blood transfusion without reported diagnosis    Chronic headaches    Complication of anesthesia    bleed after last shoulder-had to stay overnight   Depression    Diabetes mellitus    Diabetic peripheral neuropathy ([place]) 03/28/2013   Diverticulosis    Dizziness    Hernia, inguinal    Hyperlipidemia    Hypertension    Impingement syndrome of left shoulder 08/02/2014   Ischemic colitis ([place])    Multi-infarct state 10/14/2014   Neuropathy of lower extremity    Night sweats    every once in a while   Sleep apnea    no CPAP   Stroke ([place]) 02/2015   Syncope and collapse     PAST SURGICAL HISTORY: Past Surgical History:  Procedure Laterality Date   BIOPSY  06/14/2019   Procedure: BIOPSY;  Surgeon: [name], MD;  Location: [facility];  Service: Gastroenterology;;   CERVICAL [place]  2003   1/3 removed for diverticulitis   COLONOSCOPY     COLONOSCOPY WITH PROPOFOL N/A 06/14/2019   Procedure: COLONOSCOPY WITH PROPOFOL;  Surgeon: [name], MD;  Location: [facility];  Service: Gastroenterology;  Laterality: N/A;   EP IMPLANTABLE DEVICE N/A 05/01/2015   Procedure: Loop Recorder Insertion;  Surgeon: [name], MD;  Location: [facility];  Service: Cardiovascular;  Laterality: N/A;   INGUINAL HERNIA REPAIR Bilateral    KNEE ARTHROSCOPY Right     SHOULDER ARTHROSCOPY Right    1   SHOULDER ARTHROSCOPY Left 08/02/2014   Procedure: LEFT SHOULDER SCOPE DEBRIDEMENT/ACROMIOPLASTY;  Surgeon: [name], MD;  Location: [facility];  Service: Orthopedics;  Laterality: Left;  ANESTHESIA: GENERAL, PRE/POST OP SCALENE   TEE WITHOUT CARDIOVERSION N/A 03/03/2015   Procedure: TRANSESOPHAGEAL ECHOCARDIOGRAM (TEE);  Surgeon: [name], MD;  [facility];  Service: Cardiology;  Laterality: N/A;   UMBILICAL HERNIA REPAIR     with other hernia repair with mesh   WRIST SURGERY Right    fusion    FAMILY HISTORY: Family History  Problem Relation Age of Onset   Stroke  Father    Hyperlipidemia Father    Heart attack Sister 69   Stroke Sister    Dementia Mother    ALS Brother        age 83   Diabetes Maternal Grandfather    Colon cancer Neg Hx    Esophageal cancer Neg Hx    Stomach cancer Neg Hx    Rectal cancer Neg Hx     SOCIAL HISTORY: Social History   Socioeconomic History   Marital status: Married    Spouse name: Not on file   Number of children: 2   Years of education: College   Highest education level: Not on file  Occupational History   Occupation: disabled  Tobacco Use   Smoking status: Former    Packs/day: 1.00    Years: 8.00    Pack years: 8.00    Types: Cigarettes    Start date: 06/07/1970    Quit date: 05/03/1978    Years since quitting: 42.7   Smokeless tobacco: Former    Types: Chew    Quit date: 05/03/1978  Vaping Use   Vaping Use: Never used  Substance and Sexual Activity   Alcohol use: No    Alcohol/week: 1.0 standard drink    Types: 1 Cans of beer per week    Comment: h/o heavy use in the past   Drug use: No   Sexual activity: Not on file  Other Topics Concern   Not on file  Social History Narrative   Drinks some coffee, Drink diet sodas and tea   Social Determinants of Health   Financial Resource Strain: Not on file  Food Insecurity: Not on file  Transportation  Needs: Not on file  Physical Activity: Not on file  Stress: Not on file  Social Connections: Not on file  Intimate Partner Violence: Not on file     PHYSICAL EXAM  Vitals:   02/03/21 1301  BP: 129/79  Pulse: 85  Weight: 188 lb (85.3 kg)  Height: '6\' 1"'  (1.854 m)   Body mass index is 24.8 kg/m.  General: well developed, well nourished,  pleasant middle-age Caucasian male, seated, in no evident distress Neck: supple with no carotid or supraclavicular bruits Cardiovascular: regular rate and rhythm, no murmurs Vascular:  Normal pulses all extremities Musculoskeletal: Limited ROM BUE 2/2 pain   Neurologic Exam Mental Status: Awake and fully alert. Occasional speech hesitancy. Oriented to place and time. Recent and remote memory intact. Attention span, concentration and fund of knowledge appropriate. Mood and affect appropriate.  Cranial Nerves: Pupils equal, briskly reactive to light. Extraocular movements full without nystagmus. Visual fields full to confrontation. Hearing intact. Facial sensation intact. Face, tongue, palate moves normally and symmetrically.  Motor: Normal bulk and tone. Normal strength in all tested extremity muscles. Sensory.:  Decreased sensation to light touch left arm distally compared to right side (chronic) Coordination: Rapid alternating movements normal in all extremities except decreased left hand. Finger-to-nose and heel-to-shin performed accurately bilaterally. Gait and Station: Arises from chair without difficulty. Stance is normal. Gait demonstrates normal stride length and balance Reflexes: 1+ and symmetric. Toes downgoing.      ASSESSMENT AND PLAN ROYSTON BEKELE is a 60 y.o. male with PMH of HTN, HLD, DM, chronic left DVT, atrial fibrillation (dx loop recorder) on Eliquis, GI bleed 2/2 ischemic colitis 2021 (d/c asa), cervical radiculopathy, OSA with CPAP noncompliance, old lacunar strokes involving b/l cerebrellar and left caudate head as well as  possible right frontal cortex. On 02/28/15,  right SCA cerebellar infarct with evidence of small PFO.  On 05/02/16, left dorsal medulla lacunar infarct and initiated aspirin in addition to Eliquis although discontinued 2021 in setting of GI bleed.     Hx of multiple stroke -Continue Eliquis and atorvastatin for secondary stroke prevention and history of A. Fib -Continue close PCP follow-up for aggressive stroke risk factor management including HTN with BP goal<130/90, HLD with LDL goal<70 and DM with A1c goal<7.0  Cervical radiculopathy -eval by neurosurg -not a candidate for surgical options due to history of multiple cervical procedures -Continue gabapentin 300/300/600 -MR CERVICAL 01/2020: C3-4 and C4-5 severe bilateral foraminal stenosis, C6-7 mild spinal stenosis and severe bilateral foraminal stenosis - per report, similar appearance compared to 2016 -EMG/NCV 05/2018: Mild neuropathies left wrist, chronic denervation in left deltoid, left biceps and left tricep muscles which could be seen on left cervical polyradiculopathy (C5, C6 and C7).  Recommend correlation with MRI or CT cervical spine     Follow-up in 1 year or call earlier if needed   CC:  Luking, Elayne Snare, MD    I spent 29 minutes of face-to-face and non-face-to-face time with patient.  This included previsit chart review, lab review, study review, electronic health record documentation, patient education regarding prior history of stroke including etiology, importance of managing stroke risk factors, cervical radiculopathy and bilateral upper extremity pain and further evaluation and answered all other questions to patient satisfaction  Frann Rider, AGNP-BC  [facility] [address] [place], [place] [address]  Phone [phone] Fax [phone] Note: This document was prepared with digital dictation and possible smart phrase technology. Any transcriptional errors that result from this  process are unintentional.

## 2021-02-03 NOTE — Patient Instructions (Addendum)
Continue Eliquis (apixaban) daily  and atorvastatin  for secondary stroke prevention  Continue to follow up with PCP/endocrinology regarding cholesterol, blood pressure and diabetes management  Maintain strict control of hypertension with blood pressure goal below 130/90, diabetes with hemoglobin A1c goal below 7.0% and cholesterol with LDL cholesterol (bad cholesterol) goal below 70 mg/dL.   Continue gabapentin at current dose      Thank you for coming to see [place] at [facility]. I hope we have been able to provide you high quality care today.  You may receive a patient satisfaction survey over the next few weeks. We would appreciate your feedback and comments so that we may continue to improve ourselves and the health of our patients.

## 2021-02-10 ENCOUNTER — Other Ambulatory Visit (HOSPITAL_COMMUNITY): Payer: Self-pay

## 2021-02-18 ENCOUNTER — Other Ambulatory Visit (INDEPENDENT_AMBULATORY_CARE_PROVIDER_SITE_OTHER): Payer: No Typology Code available for payment source

## 2021-02-18 ENCOUNTER — Other Ambulatory Visit: Payer: Self-pay

## 2021-02-18 DIAGNOSIS — Z23 Encounter for immunization: Secondary | ICD-10-CM | POA: Diagnosis not present

## 2021-02-21 ENCOUNTER — Other Ambulatory Visit (HOSPITAL_COMMUNITY): Payer: Self-pay

## 2021-02-21 MED FILL — Insulin Degludec Soln Pen-Injector 100 Unit/ML: SUBCUTANEOUS | 30 days supply | Qty: 15 | Fill #3 | Status: AC

## 2021-03-02 ENCOUNTER — Telehealth: Payer: Self-pay | Admitting: Family Medicine

## 2021-03-02 NOTE — Telephone Encounter (Signed)
Patient's wife called and stated patient was seeing blood when he goes to the rest room. It was first understood as seeing blood in his urine. The nurse instructed to go to urgent care. Before end of call she stated he was seeing blood when he wipes. I instructed to go to urgent care and she stated he is refusing to go. Told her to call back as there might be cancellations but could not promise appointment.  Please advise. Thank you

## 2021-03-02 NOTE — Telephone Encounter (Signed)
Pt wife contacted and verbalized understanding. Pt placed on schedule for 3:50 tomorrow.

## 2021-03-02 NOTE — Telephone Encounter (Signed)
Patient's wife called and stated patient was seeing blood when he goes to the rest room. It was first understood as seeing blood in his urine. The nurse instructed to go to urgent care. Before end of call she stated he was seeing blood when he wipes. I instructed to go to urgent care and she stated he is refusing to go. Told her to call back as there might be cancellations but could not promise appointment.   [phone]

## 2021-03-02 NOTE — Telephone Encounter (Signed)
Patient called this morning for appointment due to rectal bleeding which started on 10/30 and was told to go to Urgent Care but refused so wife([name])  called wanting to know what to do.Please advise

## 2021-03-02 NOTE — Telephone Encounter (Signed)
If heavy bleeding he should go to the ER otherwise tomorrow at 3:50 PM would be the soonest we could work him in

## 2021-03-03 ENCOUNTER — Encounter: Payer: Self-pay | Admitting: Family Medicine

## 2021-03-03 ENCOUNTER — Ambulatory Visit (INDEPENDENT_AMBULATORY_CARE_PROVIDER_SITE_OTHER): Payer: No Typology Code available for payment source | Admitting: Family Medicine

## 2021-03-03 ENCOUNTER — Other Ambulatory Visit: Payer: Self-pay

## 2021-03-03 VITALS — BP 142/74 | HR 54 | Temp 97.5°F | Ht 73.0 in | Wt 188.0 lb

## 2021-03-03 DIAGNOSIS — K4090 Unilateral inguinal hernia, without obstruction or gangrene, not specified as recurrent: Secondary | ICD-10-CM | POA: Insufficient documentation

## 2021-03-03 DIAGNOSIS — K921 Melena: Secondary | ICD-10-CM | POA: Diagnosis not present

## 2021-03-03 DIAGNOSIS — R030 Elevated blood-pressure reading, without diagnosis of hypertension: Secondary | ICD-10-CM | POA: Diagnosis not present

## 2021-03-03 NOTE — Progress Notes (Signed)
   Subjective:    Patient ID: Travis [name], male    DOB: March 22, 1961, 60 y.o.   MRN: [id number]  HPI Rectal bleeding- seldom seen on tissue  Recently seen on tissue only None in stool or comode Patient denies any severe rectal bleeding states it was more so when he wiped he noticed a small amount of blood on the tissue.  He does try to eat relatively healthy.  He has had a colonoscopy 2 years ago At times feels sharp pain in right inguinal area Review of Systems     Objective:   Physical Exam Right inguinal hernia Abd soft No tenderness       Assessment & Plan:  IFBOT He does have a small hernia Does not need surgery currently Will also send message to his gastroenterologist may need colonoscopy Await stool test

## 2021-03-03 NOTE — Patient Instructions (Addendum)
Fiber Content in Foods Fiber is a substance that is found in plant foods, such as fruits, vegetables, whole grains, nuts, seeds, and beans. As part of your treatment and recovery plan, your health care provider may recommend that you eat foods that have specific amounts of dietary fiber. Some conditions may require a high-fiber diet while others may require a low-fiber diet. This sheet gives you information about the dietary fiber content of some common foods. Your health care provider will tell you how much fiber you need in your diet. If you have problems or questions, contact your health care provider or dietitian. What foods are high in fiber? Fruits Blackberries or raspberries (fresh) --  cup (75 g) has 4 g of fiber. Pear (fresh) -- 1 medium (180 g) has 5.5 g of fiber. Prunes (dried) -- 6 to 8 pieces (57-76 g) has 5 g of fiber. Apple with skin -- 1 medium (182 g) has 4.8 g of fiber. Guava -- 1 cup (128 g) has 8.9 g of fiber. Vegetables Peas (frozen) --  cup (80 g) has 4.4 g of fiber. Potato with skin (baked) -- 1 medium (173 g) has 4.4 g of fiber. Pumpkin (canned) --  cup (122 g) has 5 g of fiber. Brussels sprouts (cooked) --  cup (78 g) has 4 g of fiber. Sweet potato --  cup mashed (124 g) has 4 g of fiber. Winter squash -- 1 cup cooked (205 g) has 5.7 g of fiber. Grains Bran cereal --  cup (31 g) has 8.6 g of fiber. Bulgur (cooked) --  cup (70 g) has 4 g of fiber. Quinoa (cooked) -- 1 cup (185 g) has 5.2 g of fiber. Popcorn -- 3 cups (375 g) popped has 5.8 g of fiber. Spaghetti, whole wheat -- 1 cup (140 g) has 6 g of fiber. Meats and other proteins Pinto beans (cooked) --  cup (90 g) has 7.7 g of fiber. Lentils (cooked) --  cup (90 g) has 7.8 g of fiber. Kidney beans (canned) --  cup (92.5 g) has 5.7 g of fiber. Soybeans (canned, frozen, or fresh) --  cup (92.5 g) has 5.2 g of fiber. Baked beans, plain or vegetarian (canned) --  cup (130 g) has 5.2 g of  fiber. Garbanzo beans or chickpeas (canned) --  cup (90 g) has 6.6 g of fiber. Black beans (cooked) --  cup (86 g) has 7.5 g of fiber. White beans or navy beans (cooked) --  cup (91 g) has 9.3 g of fiber. The items listed above may not be a complete list of foods with high fiber. Actual amounts of fiber may be different depending on processing. Contact a dietitian for more information. What foods are moderate in fiber? Fruits Banana -- 1 medium (126 g) has 3.2 g of fiber. Melon -- 1 cup (155 g) has 1.4 g of fiber. Orange -- 1 small (154 g) has 3.7 g of fiber. Raisins --  cup (40 g) has 1.8 g of fiber. Applesauce, sweetened --  cup (125 g) has 1.5 g of fiber. Blueberries (fresh) --  cup (75 g) has 1.8 g of fiber. Strawberries (fresh, sliced) -- 1 cup (741 g) has 3 g of fiber. Cherries -- 1 cup (140 g) has 2.9 g of fiber. Vegetables Broccoli (cooked) --  cup (77.5 g) has 2.1 g of fiber. Carrots (cooked) --  cup (77.5 g) has 2.2 g of fiber. Corn (canned or frozen) --  cup (82.5 g) has 2.1  g of fiber. Potatoes, mashed --  cup (105 g) has 1.6 g of fiber. Tomato -- 1 medium (62 g) has 1.5 g of fiber. Green beans (canned) --  cup (83 g) has 2 g of fiber. Squash, winter --  cup (58 g) has 1 g of fiber. Sweet potato, baked -- 1 medium (150 g) has 3 g of fiber. Cauliflower (cooked) -- 1/2 cup (90 g) has 2.3 g of fiber. Grains Long-grain brown rice (cooked) -- 1 cup (196 g) has 3.5 g of fiber. Bagel, plain -- one 4-inch (10 cm) bagel has 2 g of fiber. Instant oatmeal --  cup (120 g) has about 2 g of fiber. Macaroni noodles, enriched (cooked) -- 1 cup (140 g) has 2.5 g of fiber. Multigrain cereal --  cup (15 g) has about 2-4 g of fiber. Whole-wheat bread -- 1 slice (26 g) has 2 g of fiber. Whole-wheat spaghetti noodles --  cup (70 g) has 3.2 g of fiber. Corn tortilla -- one 6-inch (15 cm) tortilla has 1.5 g of fiber. Meats and other proteins Almonds --  cup or 1 oz (28 g) has  3.5 g of fiber. Sunflower seeds in shell --  cup or  oz (11.5 g) has 1.1 g of fiber. Vegetable or soy patty -- 1 patty (70 g) has 3.4 g of fiber. Walnuts --  cup or 1 oz (30 g) has 2 g of fiber. Flax seed -- 1 Tbsp (7 g) has 2.8 g of fiber. The items listed above may not be a complete list of foods that have moderate amounts of fiber. Actual amounts of fiber may be different depending on processing. Contact a dietitian for more information. What foods are low in fiber? Low-fiber foods contain less than 1 g of fiber per serving. They include: Fruits Fruit juice --  cup or 4 fl oz (118 mL) has 0.5 g of fiber. Vegetables Lettuce -- 1 cup (35 g) has 0.5 g of fiber. Cucumber (slices) --  cup (60 g) has 0.3 g of fiber. Celery -- 1 stalk (40 g) has 0.1 g of fiber. Grains Flour tortilla -- one 6-inch (15 cm) tortilla has 0.5 g of fiber. White rice (cooked) --  cup (81.5 g) has 0.3 g of fiber. Meats and other proteins Egg -- 1 large (50 g) has 0 g of fiber. Meat, poultry, or fish -- 3 oz (85 g) has 0 g of fiber. Dairy Milk -- 1 cup or 8 fl oz (237 mL) has 0 g of fiber. Yogurt -- 1 cup (245 g) has 0 g of fiber. The items listed above may not be a complete list of foods that are low in fiber. Actual amounts of fiber may be different depending on processing. Contact a dietitian for more information. Summary Fiber is a substance that is found in plant foods, such as fruits, vegetables, whole grains, nuts, seeds, and beans. As part of your treatment and recovery plan, your health care provider may recommend that you eat foods that have specific amounts of dietary fiber. This information is not intended to replace advice given to you by your health care provider. Make sure you discuss any questions you have with your health care provider. Document Revised: 08/23/2019 Document Reviewed: 08/23/2019 Elsevier Patient Education  2022 [place] Eating Plan DASH stands for Dietary Approaches  to Stop Hypertension. The DASH eating plan is a healthy eating plan that has been shown to: Reduce high blood pressure (hypertension). Reduce your  risk for type 2 diabetes, heart disease, and stroke. Help with weight loss. What are tips for following this plan? Reading food labels Check food labels for the amount of salt (sodium) per serving. Choose foods with less than 5 percent of the Daily Value of sodium. Generally, foods with less than 300 milligrams (mg) of sodium per serving fit into this eating plan. To find whole grains, look for the word "whole" as the first word in the ingredient list. Shopping Buy products labeled as "low-sodium" or "no salt added." Buy fresh foods. Avoid canned foods and pre-made or frozen meals. Cooking Avoid adding salt when cooking. Use salt-free seasonings or herbs instead of table salt or sea salt. Check with your health care provider or pharmacist before using salt substitutes. Do not fry foods. Cook foods using healthy methods such as baking, boiling, grilling, roasting, and broiling instead. Cook with heart-healthy oils, such as olive, canola, avocado, soybean, or sunflower oil. Meal planning  Eat a balanced diet that includes: 4 or more servings of fruits and 4 or more servings of vegetables each day. Try to fill one-half of your plate with fruits and vegetables. 6-8 servings of whole grains each day. Less than 6 oz (170 g) of lean meat, poultry, or fish each day. A 3-oz (85-g) serving of meat is about the same size as a deck of cards. One egg equals 1 oz (28 g). 2-3 servings of low-fat dairy each day. One serving is 1 cup (237 mL). 1 serving of nuts, seeds, or beans 5 times each week. 2-3 servings of heart-healthy fats. Healthy fats called omega-3 fatty acids are found in foods such as walnuts, flaxseeds, fortified milks, and eggs. These fats are also found in cold-water fish, such as sardines, salmon, and mackerel. Limit how much you eat of: Canned or  prepackaged foods. Food that is high in trans fat, such as some fried foods. Food that is high in saturated fat, such as fatty meat. Desserts and other sweets, sugary drinks, and other foods with added sugar. Full-fat dairy products. Do not salt foods before eating. Do not eat more than 4 egg yolks a week. Try to eat at least 2 vegetarian meals a week. Eat more home-cooked food and less restaurant, buffet, and fast food. Lifestyle When eating at a restaurant, ask that your food be prepared with less salt or no salt, if possible. If you drink alcohol: Limit how much you use to: 0-1 drink a day for women who are not pregnant. 0-2 drinks a day for men. Be aware of how much alcohol is in your drink. In the [place] one drink equals one 12 oz bottle of beer (355 mL), one 5 oz glass of wine (148 mL), or one 1 oz glass of hard liquor (44 mL). General information Avoid eating more than 2,300 mg of salt a day. If you have hypertension, you may need to reduce your sodium intake to 1,500 mg a day. Work with your health care provider to maintain a healthy body weight or to lose weight. Ask what an ideal weight is for you. Get at least 30 minutes of exercise that causes your heart to beat faster (aerobic exercise) most days of the week. Activities may include walking, swimming, or biking. Work with your health care provider or dietitian to adjust your eating plan to your individual calorie needs. What foods should I eat? Fruits All fresh, dried, or frozen fruit. Canned fruit in natural juice (without added sugar). Vegetables Fresh or  frozen vegetables (raw, steamed, roasted, or grilled). Low-sodium or reduced-sodium tomato and vegetable juice. Low-sodium or reduced-sodium tomato sauce and tomato paste. Low-sodium or reduced-sodium canned vegetables. Grains Whole-grain or whole-wheat bread. Whole-grain or whole-wheat pasta. Brown rice. [place]. Bulgur. Whole-grain and low-sodium cereals. Pita  bread. Low-fat, low-sodium crackers. Whole-wheat flour tortillas. Meats and other proteins Skinless chicken or [place]. Ground chicken or [place]. Pork with fat trimmed off. Fish and seafood. Egg whites. Dried beans, peas, or lentils. Unsalted nuts, nut butters, and seeds. Unsalted canned beans. Lean cuts of beef with fat trimmed off. Low-sodium, lean precooked or cured meat, such as sausages or meat loaves. Dairy Low-fat (1%) or fat-free (skim) milk. Reduced-fat, low-fat, or fat-free cheeses. Nonfat, low-sodium ricotta or cottage cheese. Low-fat or nonfat yogurt. Low-fat, low-sodium cheese. Fats and oils Soft margarine without trans fats. Vegetable oil. Reduced-fat, low-fat, or light mayonnaise and salad dressings (reduced-sodium). Canola, safflower, olive, avocado, soybean, and sunflower oils. Avocado. Seasonings and condiments Herbs. Spices. Seasoning mixes without salt. Other foods Unsalted popcorn and pretzels. Fat-free sweets. The items listed above may not be a complete list of foods and beverages you can eat. Contact a dietitian for more information. What foods should I avoid? Fruits Canned fruit in a light or heavy syrup. Fried fruit. Fruit in cream or butter sauce. Vegetables Creamed or fried vegetables. Vegetables in a cheese sauce. Regular canned vegetables (not low-sodium or reduced-sodium). Regular canned tomato sauce and paste (not low-sodium or reduced-sodium). Regular tomato and vegetable juice (not low-sodium or reduced-sodium). Angie Fava. Olives. Grains Baked goods made with fat, such as croissants, muffins, or some breads. Dry pasta or rice meal packs. Meats and other proteins Fatty cuts of meat. Ribs. Fried meat. Berniece Salines. Bologna, salami, and other precooked or cured meats, such as sausages or meat loaves. Fat from the back of a pig (fatback). Bratwurst. Salted nuts and seeds. Canned beans with added salt. Canned or smoked fish. Whole eggs or egg yolks. Chicken or [place] with  skin. Dairy Whole or 2% milk, cream, and half-and-half. Whole or full-fat cream cheese. Whole-fat or sweetened yogurt. Full-fat cheese. Nondairy creamers. Whipped toppings. Processed cheese and cheese spreads. Fats and oils Butter. Stick margarine. Lard. Shortening. Ghee. Bacon fat. Tropical oils, such as coconut, palm kernel, or palm oil. Seasonings and condiments Onion salt, garlic salt, seasoned salt, table salt, and sea salt. Worcestershire sauce. Tartar sauce. Barbecue sauce. Teriyaki sauce. Soy sauce, including reduced-sodium. Steak sauce. Canned and packaged gravies. Fish sauce. Oyster sauce. Cocktail sauce. Store-bought horseradish. Ketchup. Mustard. Meat flavorings and tenderizers. Bouillon cubes. Hot sauces. Pre-made or packaged marinades. Pre-made or packaged taco seasonings. Relishes. Regular salad dressings. Other foods Salted popcorn and pretzels. The items listed above may not be a complete list of foods and beverages you should avoid. Contact a dietitian for more information. Where to find more information National Heart, Lung, and Blood Institute: https://wilson-eaton.com/ American Heart Association: www.heart.org Academy of Nutrition and Dietetics: www.eatright.[place]: www.kidney.org Summary The DASH eating plan is a healthy eating plan that has been shown to reduce high blood pressure (hypertension). It may also reduce your risk for type 2 diabetes, heart disease, and stroke. When on the DASH eating plan, aim to eat more fresh fruits and vegetables, whole grains, lean proteins, low-fat dairy, and heart-healthy fats. With the DASH eating plan, you should limit salt (sodium) intake to 2,300 mg a day. If you have hypertension, you may need to reduce your sodium intake to 1,500 mg a day. Work with  your health care provider or dietitian to adjust your eating plan to your individual calorie needs. This information is not intended to replace advice given to you by your  health care provider. Make sure you discuss any questions you have with your health care provider. Document Revised: 03/23/2019 Document Reviewed: 03/23/2019 Elsevier Patient Education  2022 [employer].

## 2021-03-06 ENCOUNTER — Other Ambulatory Visit: Payer: Self-pay | Admitting: Family Medicine

## 2021-03-06 LAB — IFOBT (OCCULT BLOOD): IFOBT: NEGATIVE

## 2021-03-06 NOTE — Addendum Note (Signed)
Addended by: Vicente Males on: 03/06/2021 10:50 AM   Modules accepted: Orders

## 2021-03-07 ENCOUNTER — Other Ambulatory Visit (HOSPITAL_COMMUNITY): Payer: Self-pay

## 2021-03-07 ENCOUNTER — Other Ambulatory Visit: Payer: Self-pay | Admitting: Family Medicine

## 2021-03-09 ENCOUNTER — Other Ambulatory Visit (HOSPITAL_COMMUNITY): Payer: Self-pay

## 2021-03-09 ENCOUNTER — Ambulatory Visit: Payer: No Typology Code available for payment source | Admitting: Family Medicine

## 2021-03-09 MED ORDER — FOLIC ACID 1 MG PO TABS
ORAL_TABLET | Freq: Every day | ORAL | 0 refills | Status: DC
  Filled 2021-03-09: qty 90, 90d supply, fill #0

## 2021-03-12 ENCOUNTER — Other Ambulatory Visit (HOSPITAL_COMMUNITY): Payer: Self-pay

## 2021-03-15 ENCOUNTER — Telehealth: Payer: Self-pay | Admitting: Family Medicine

## 2021-03-15 NOTE — Telephone Encounter (Signed)
Please let the patient know that I did communicate his issue regarding a small amount of blood on the tissue paper with his GI doctor  Dr. Loletha Carrow states that more likely this is benign and does not need colonoscopy but if he gets worse or continues to follow-up with them  I would recommend the patient do a follow-up visit with [place] within 3 months and notify us sooner if having ongoing troubles  His recent stool test being negative for blood is reassuring

## 2021-03-16 NOTE — Telephone Encounter (Signed)
Left message to return call 

## 2021-03-16 NOTE — Telephone Encounter (Signed)
Patient's wife(DPR) notified and verbalized understanding and stated he has a follow up with the NP at [facility] 04/01/21 and a follow up with [name] 04/14/21.

## 2021-03-17 ENCOUNTER — Other Ambulatory Visit: Payer: Self-pay

## 2021-03-17 ENCOUNTER — Ambulatory Visit (INDEPENDENT_AMBULATORY_CARE_PROVIDER_SITE_OTHER): Payer: No Typology Code available for payment source | Admitting: Family Medicine

## 2021-03-17 VITALS — Temp 97.5°F | Ht 73.0 in | Wt 192.0 lb

## 2021-03-17 DIAGNOSIS — R059 Cough, unspecified: Secondary | ICD-10-CM

## 2021-03-17 DIAGNOSIS — B349 Viral infection, unspecified: Secondary | ICD-10-CM

## 2021-03-17 NOTE — Progress Notes (Signed)
   Subjective:    Patient ID: Travis [name], male    DOB: 1960/06/01, 60 y.o.   MRN: [id number]  HPI  Patient arrives with cough and congestion for a few days Started on Sunday.  Started mainly with head congestion drainage.  Also some body aches feeling fatigued tired.  No wheezing or difficulty breathing.  No vomiting or high fevers.  PMH benign Review of Systems     Objective:   Physical Exam  Eardrums are normal lungs are clear no crackles heart regular      Assessment & Plan:  Viral syndrome Triple swab was taken Patient to rest up over the next few days Follow-up if progressive troubles or worse No antibiotic indicated currently

## 2021-03-18 ENCOUNTER — Telehealth: Payer: Self-pay | Admitting: Family Medicine

## 2021-03-18 LAB — COVID-19, FLU A+B AND RSV
Influenza A, NAA: NOT DETECTED
Influenza B, NAA: NOT DETECTED
RSV, NAA: DETECTED — AB
SARS-CoV-2, NAA: NOT DETECTED

## 2021-03-18 NOTE — Telephone Encounter (Signed)
Pt wife called in after seeing labs. RSV positive. Wife is wondering if anything needs to be called in. Please advise. Thank you  [facility].

## 2021-03-18 NOTE — Telephone Encounter (Signed)
Pt wife contacted and verbalized understanding.  

## 2021-03-18 NOTE — Telephone Encounter (Signed)
It gets better over time, no meds, stay away from toddlers and infants

## 2021-03-21 ENCOUNTER — Other Ambulatory Visit (HOSPITAL_COMMUNITY): Payer: Self-pay

## 2021-03-21 MED FILL — Insulin Degludec Soln Pen-Injector 100 Unit/ML: SUBCUTANEOUS | 30 days supply | Qty: 15 | Fill #4 | Status: AC

## 2021-03-24 ENCOUNTER — Encounter: Payer: Self-pay | Admitting: Family Medicine

## 2021-03-24 NOTE — Telephone Encounter (Signed)
Nurses-please communicate with patient.  Is he having any trouble breathing in his lungs?  Or does all of this seems to be in his head at this point?  As for the congestion from the nose clearish?  Discolored?  Feeling short of breath?

## 2021-03-24 NOTE — Telephone Encounter (Signed)
I recommend Omnicef 300 mg 1 twice daily for 7 days if any ongoing troubles recommend follow-up office visit

## 2021-03-25 MED ORDER — CEFDINIR 300 MG PO CAPS: ORAL_CAPSULE | ORAL | 0 refills | Status: DC

## 2021-03-25 NOTE — Addendum Note (Signed)
Addended by: Vicente Males on: 03/25/2021 08:51 AM   Modules accepted: Orders

## 2021-03-30 ENCOUNTER — Encounter: Payer: Self-pay | Admitting: Nurse Practitioner

## 2021-03-30 ENCOUNTER — Other Ambulatory Visit (HOSPITAL_COMMUNITY): Payer: Self-pay

## 2021-03-30 ENCOUNTER — Ambulatory Visit (INDEPENDENT_AMBULATORY_CARE_PROVIDER_SITE_OTHER): Payer: No Typology Code available for payment source | Admitting: Nurse Practitioner

## 2021-03-30 ENCOUNTER — Other Ambulatory Visit: Payer: Self-pay

## 2021-03-30 VITALS — BP 147/90 | HR 88 | Ht 73.0 in | Wt 188.0 lb

## 2021-03-30 DIAGNOSIS — E1159 Type 2 diabetes mellitus with other circulatory complications: Secondary | ICD-10-CM | POA: Diagnosis not present

## 2021-03-30 DIAGNOSIS — E782 Mixed hyperlipidemia: Secondary | ICD-10-CM | POA: Diagnosis not present

## 2021-03-30 DIAGNOSIS — D3502 Benign neoplasm of left adrenal gland: Secondary | ICD-10-CM

## 2021-03-30 DIAGNOSIS — E559 Vitamin D deficiency, unspecified: Secondary | ICD-10-CM | POA: Diagnosis not present

## 2021-03-30 LAB — POCT GLYCOSYLATED HEMOGLOBIN (HGB A1C): HbA1c, POC (controlled diabetic range): 6.5 % (ref 0.0–7.0)

## 2021-03-30 MED ORDER — INSULIN DEGLUDEC 100 UNIT/ML ~~LOC~~ SOPN
35.0000 [IU] | PEN_INJECTOR | Freq: Every day | SUBCUTANEOUS | 3 refills | Status: DC
  Filled 2021-03-30 – 2021-04-18 (×2): qty 30, 85d supply, fill #0
  Filled 2021-07-09: qty 30, 85d supply, fill #1
  Filled 2021-10-03: qty 30, 85d supply, fill #2

## 2021-03-30 NOTE — Patient Instructions (Signed)

## 2021-03-30 NOTE — Progress Notes (Signed)
03/30/2021, 11:02 AM              Endocrinology follow-up note    Subjective:    Patient ID: Travis [name], male    DOB: 1961-03-02.  Travis [name] is being seen in follow-up  for management of currently uncontrolled symptomatic diabetes requested by  Kathyrn Drown, MD.   Past Medical History:  Diagnosis Date   A-fib ([place]) 02/2016   found on loop recorder   Adhesive capsulitis of left shoulder 08/02/2014   Anxiety    Arthritis    Blood transfusion without reported diagnosis    Chronic headaches    Complication of anesthesia    bleed after last shoulder-had to stay overnight   Depression    Diabetes mellitus    Diabetic peripheral neuropathy ([place]) 03/28/2013   Diverticulosis    Dizziness    Hernia, inguinal    Hyperlipidemia    Hypertension    Impingement syndrome of left shoulder 08/02/2014   Ischemic colitis ([place])    Multi-infarct state 10/14/2014   Neuropathy of lower extremity    Night sweats    every once in a while   Sleep apnea    no CPAP   Stroke ([place]) 02/2015   Syncope and collapse    Past Surgical History:  Procedure Laterality Date   BIOPSY  06/14/2019   Procedure: BIOPSY;  Surgeon: [name], MD;  Location: [facility];  Service: Gastroenterology;;   CERVICAL [place]  2003   1/3 removed for diverticulitis   COLONOSCOPY     COLONOSCOPY WITH PROPOFOL N/A 06/14/2019   Procedure: COLONOSCOPY WITH PROPOFOL;  Surgeon: [name], MD;  Location: [facility];  Service: Gastroenterology;  Laterality: N/A;   EP IMPLANTABLE DEVICE N/A 05/01/2015   Procedure: Loop Recorder Insertion;  Surgeon: [name], MD;  Location: [facility];  Service: Cardiovascular;  Laterality: N/A;   INGUINAL HERNIA REPAIR Bilateral    KNEE ARTHROSCOPY Right    SHOULDER ARTHROSCOPY Right    1   SHOULDER ARTHROSCOPY Left 08/02/2014   Procedure: LEFT SHOULDER SCOPE  DEBRIDEMENT/ACROMIOPLASTY;  Surgeon: [name], MD;  Location: [facility];  Service: Orthopedics;  Laterality: Left;  ANESTHESIA: GENERAL, PRE/POST OP SCALENE   TEE WITHOUT CARDIOVERSION N/A 03/03/2015   Procedure: TRANSESOPHAGEAL ECHOCARDIOGRAM (TEE);  Surgeon: [name], MD;  Location: [facility];  Service: Cardiology;  Laterality: N/A;   UMBILICAL HERNIA REPAIR     with other hernia repair with mesh   WRIST SURGERY Right    fusion   Social History   Socioeconomic History   Marital status: Married    Spouse name: Not on file   Number of children: 2   Years of education: College   Highest education level: Not on file  Occupational History   Occupation: disabled  Tobacco Use   Smoking status: Former    Packs/day: 1.00    Years: 8.00    Pack years: 8.00    Types: Cigarettes    Start date: 06/07/1970    Quit date: 05/03/1978    Years since quitting: 42.9   Smokeless tobacco: Former  Types: Sarina Ser    Quit date: 05/03/1978  Vaping Use   Vaping Use: Never used  Substance and Sexual Activity   Alcohol use: No    Alcohol/week: 1.0 standard drink    Types: 1 Cans of beer per week    Comment: h/o heavy use in the past   Drug use: No   Sexual activity: Not on file  Other Topics Concern   Not on file  Social History Narrative   Drinks some coffee, Drink diet sodas and tea   Social Determinants of Health   Financial Resource Strain: Not on file  Food Insecurity: Not on file  Transportation Needs: Not on file  Physical Activity: Not on file  Stress: Not on file  Social Connections: Not on file   Outpatient Encounter Medications as of 03/30/2021  Medication Sig   Accu-Chek FastClix Lancets MISC USE TO CHECK BLOOD SUGAR 2 TIMES A DAY   ALPRAZolam (XANAX) 0.25 MG tablet TAKE 1 TABLET BY MOUTH 3 TIMES DAILY AS NEEDED   apixaban (ELIQUIS) 5 MG TABS tablet TAKE 1 TABLET BY MOUTH 2 TIMES DAILY.   atorvastatin (LIPITOR) 20 MG tablet Take 1 tablet (20 mg  total) by mouth daily.   Blood Glucose Monitoring Suppl (BLOOD GLUCOSE MONITOR SYSTEM) w/Device KIT Test glucose 2 time daily.  Meter/strips/lancets. Please dispense per patient/insurance preferrence   Blood Glucose Monitoring Suppl (FREESTYLE LITE) w/Device KIT USE AS DIRECTED 2 TIMES DAILY   Blood Pressure Monitoring (OMRON 3 SERIES BP MONITOR) DEVI USE AS DIRECTED.   cefdinir (OMNICEF) 300 MG capsule Take one capsule po twice daily for 7 days   Cholecalciferol 50 MCG (2000 UT) CAPS Take 1 capsule (2,000 Units total) by mouth daily with breakfast.   FLUoxetine (PROZAC) 20 MG capsule Take 1 capsule (20 mg total) by mouth daily.   fluticasone (FLONASE) 50 MCG/ACT nasal spray Place 1 spray into both nostrils daily.   folic acid (FOLVITE) 1 MG tablet TAKE 1 TABLET BY MOUTH ONCE A DAY   gabapentin (NEURONTIN) 300 MG capsule TAKE 1 CAPSULE BY MOUTH IN THE MORNING, 1 CAPSULE BY MOUTH IN THE AFTERNOON THEN 2 CAPSULES NIGHTLY   glucose blood test strip USE AS DIRECTED TO TEST BLOOD SUGAR 2 TIMES DAILY (Patient taking differently: USE AS DIRECTED TO TEST BLOOD SUGAR 2 TIMES DAILY)   hyoscyamine (LEVSIN SL) 0.125 MG SL tablet Place 1 tablet (0.125 mg total) under the tongue daily before breakfast.   insulin degludec (TRESIBA) 100 UNIT/ML FlexTouch Pen Inject 35 Units into the skin at bedtime.   Insulin Pen Needle (UNIFINE PENTIPS) 31G X 6 MM MISC USE DAILY AS DIRECTED   Lancets (FREESTYLE) lancets USE AS DIRECTED 2 TIMES DAILY TO CHECK BLOOD SUGAR (Patient taking differently: USE AS DIRECTED 2 TIMES DAILY TO CHECK BLOOD SUGAR)   loratadine (CLARITIN) 10 MG tablet TAKE 1 TABLET BY MOUTH DAILY   metFORMIN (GLUCOPHAGE) 500 MG tablet TAKE 1 TABLET BY MOUTH TWO TIMES DAILY WITH A MEAL   Multiple Vitamin (MULTIVITAMIN WITH MINERALS) TABS tablet Take 1 tablet daily by mouth.   valACYclovir (VALTREX) 1000 MG tablet Take 1 tablet (1,000 mg total) by mouth daily.   [DISCONTINUED] insulin degludec (TRESIBA) 100  UNIT/ML FlexTouch Pen INJECT 50 UNITS INTO THE SKIN AT BEDTIME.   [DISCONTINUED] Insulin Glargine (BASAGLAR KWIKPEN) 100 UNIT/ML INJECT 50 UNITS INTO THE SKIN AT BEDTIME   No facility-administered encounter medications on file as of 03/30/2021.    ALLERGIES: Allergies  Allergen Reactions  Lisinopril Cough    Patient/spouse is not aware/familiar with why this is listed as an allergy Not a true allergy but on this list to avoid ACE inhibitors-SA Luking MD    VACCINATION STATUS: Immunization History  Administered Date(s) Administered   Influenza Split 03/28/2013   Influenza,inj,Quad PF,6+ Mos 02/18/2014, 01/23/2015, 02/06/2016, 02/23/2017, 02/24/2018, 01/31/2020, 02/18/2021   Influenza-Unspecified 01/02/2011, 02/09/2019   Moderna Sars-Covid-2 Vaccination 07/21/2019, 08/22/2019, 05/01/2020   Pneumococcal Polysaccharide-23 01/31/2010, 03/01/2015   Tdap 02/21/2003, 02/18/2014   Zoster Recombinat (Shingrix) 04/19/2019, 06/29/2019    Diabetes He presents for his follow-up diabetic visit. He has type 2 diabetes mellitus. Onset time: He was diagnosed at approximate age of 69 years. His disease course has been stable (He has prior history of heavy alcohol use/abuse.). Hypoglycemia symptoms include nervousness/anxiousness, sweats and tremors. Pertinent negatives for hypoglycemia include no confusion, headaches, pallor or seizures. Associated symptoms include foot paresthesias. Pertinent negatives for diabetes include no chest pain, no fatigue, no polydipsia, no polyphagia, no polyuria and no weakness. There are no hypoglycemic complications. Symptoms are stable. Diabetic complications include a CVA, nephropathy and peripheral neuropathy. Risk factors for coronary artery disease include dyslipidemia, diabetes mellitus, hypertension, family history, male sex, tobacco exposure and sedentary lifestyle. Current diabetic treatment includes insulin injections and oral agent (monotherapy). He is compliant  with treatment most of the time. His weight is stable. He is following a generally unhealthy diet. When asked about meal planning, he reported none. He has had a previous visit with a dietitian. He participates in exercise intermittently. His overall blood glucose range is 90-110 mg/dl. (He presents today with his meter, no logs, showing tight fasting glycemic profile.  His POCT A1c today is 6.5%, improving from last visit of 7.1%.  He has worked hard to get better control of his diabetes so he can potentially have right shoulder surgery.  He does deviate from the prescribed Tresiba amount at times due to his glucose readings.  He does have some significant fasting hypoglycemia noted at times.  Analysis of his meter shows 7-day average of 91, 14-day average of 97, 30-day average of 101.) An ACE inhibitor/angiotensin II receptor blocker is not being taken. He does not see a podiatrist.Eye exam is current.  Hyperlipidemia This is a chronic problem. The current episode started more than 1 year ago. The problem is controlled. Recent lipid tests were reviewed and are normal. Exacerbating diseases include chronic renal disease and diabetes. Factors aggravating his hyperlipidemia include smoking. Pertinent negatives include no chest pain, myalgias or shortness of breath. Current antihyperlipidemic treatment includes statins. The current treatment provides moderate improvement of lipids. There are no compliance problems.  Risk factors for coronary artery disease include dyslipidemia, diabetes mellitus, hypertension, male sex, family history and a sedentary lifestyle.  Hypertension This is a chronic problem. The current episode started more than 1 year ago. The problem has been gradually improving since onset. The problem is controlled. Associated symptoms include sweats. Pertinent negatives include no chest pain, headaches, neck pain, palpitations or shortness of breath. There are no associated agents to hypertension.  Risk factors for coronary artery disease include dyslipidemia, diabetes mellitus, family history, male gender, sedentary lifestyle and smoking/tobacco exposure. Past treatments include nothing. Compliance problems include diet.  Hypertensive end-organ damage includes kidney disease, CAD/MI and CVA. Identifiable causes of hypertension include chronic renal disease and sleep apnea.   Review of systems  Constitutional: + Minimally fluctuating body weight,  current Body mass index is 24.8 kg/m. , no fatigue, no subjective  hyperthermia, no subjective hypothermia Eyes: no blurry vision, no xerophthalmia ENT: no sore throat, no nodules palpated in throat, no dysphagia/odynophagia, no hoarseness Cardiovascular: no chest pain, no shortness of breath, no palpitations, no leg swelling Respiratory: no cough, no shortness of breath Gastrointestinal: no nausea/vomiting/diarrhea Musculoskeletal: right shoulder pain- being evaluated for surgery Skin: no rashes, no hyperemia Neurological: no tremors, + numbness/tingling to BLE, no dizziness Psychiatric: no depression, no anxiety  Objective:    BP (!) 147/90   Pulse 88   Ht _0  (1.854 m)   Wt 188 lb (85.3 kg)   BMI 24.80 kg/m   Wt Readings from Last 3 Encounters:  03/30/21 188 lb (85.3 kg)  03/17/21 192 lb (87.1 kg)  03/03/21 188 lb (85.3 kg)    BP Readings from Last 3 Encounters:  03/30/21 (!) 147/90  03/03/21 (!) 142/74  02/03/21 129/79     Physical Exam- Limited  Constitutional:  Body mass index is 24.8 kg/m. , not in acute distress, + inattentive, hyperactive state of mind with rapid/pressured speech Eyes:  EOMI, no exophthalmos Neck: Supple Cardiovascular: RRR, no murmurs, rubs, or gallops, no edema Respiratory: Adequate breathing efforts, no crackles, rales, rhonchi, or wheezing Musculoskeletal: no gross deformities, strength intact in all four extremities, no gross restriction of joint movements Skin:  no rashes, no  hyperemia Neurological: no tremor with outstretched hands      CMP     Component Value Date/Time   NA 140 07/11/2020 1114   K 4.4 07/11/2020 1114   CL 105 07/11/2020 1114   CO2 21 07/11/2020 1114   GLUCOSE 164 (H) 07/11/2020 1114   GLUCOSE 217 (H) 06/16/2019 0520   BUN 10 07/11/2020 1114   CREATININE 0.98 07/11/2020 1114   CREATININE 0.82 02/16/2014 1053   CALCIUM 8.9 07/11/2020 1114   PROT 6.9 07/11/2020 1114   ALBUMIN 4.5 07/11/2020 1114   AST 22 07/11/2020 1114   ALT 9 07/11/2020 1114   ALKPHOS 75 07/11/2020 1114   BILITOT 0.6 07/11/2020 1114   GFRNONAA 80 05/30/2020 1156   GFRAA 92 05/30/2020 1156     Diabetic Labs (most recent): Lab Results  Component Value Date   HGBA1C 6.5 03/30/2021   HGBA1C 7.1 (A) 11/27/2020   HGBA1C 8.2 (A) 07/15/2020     Lipid Panel ( most recent) Lipid Panel     Component Value Date/Time   CHOL 103 07/11/2020 1114   TRIG 56 07/11/2020 1114   HDL 33 (L) 07/11/2020 1114   CHOLHDL 3.1 07/11/2020 1114   CHOLHDL 3.2 05/03/2016 0545   VLDL 16 05/03/2016 0545   LDLCALC 57 07/11/2020 1114      Lab Results  Component Value Date   TSH 2.120 12/07/2019   TSH 2.12 12/07/2019   TSH 1.960 03/12/2017   TSH 1.460 10/07/2016   TSH 0.720 05/02/2016   TSH 1.895 10/14/2014   FREET4 1.19 12/07/2019   FREET4 1.21 10/07/2016      Assessment & Plan:   1) DM type 2 causing vascular disease ([place])  - Travis [name] has currently uncontrolled symptomatic type 2 DM since  60 years of age. He reports near target fasting glycemic profile and slightly above target postprandial blood glucose readings.    He presents today with his meter, no logs, showing tight fasting glycemic profile.  His POCT A1c today is 6.5%, improving from last visit of 7.1%.  He has worked hard to get better control of his diabetes so he can potentially have right shoulder  surgery.  He does deviate from the prescribed Tresiba amount at times due to his glucose readings.   He does have some significant fasting hypoglycemia noted at times.  Analysis of his meter shows 7-day average of 91, 14-day average of 97, 30-day average of 101.  -his diabetes is complicated by CVA, history of smoking, history of heavy alcohol use/abuse in the past and [place] remains at a high risk for more acute and chronic complications which include CAD, CVA, CKD, retinopathy, and neuropathy. These are all discussed in detail with the patient.  - Nutritional counseling repeated at each appointment due to patients tendency to fall back in to old habits.  - The patient admits there is a room for improvement in their diet and drink choices. -  Suggestion is made for the patient to avoid simple carbohydrates from their diet including Cakes, Sweet Desserts / Pastries, Ice Cream, Soda (diet and regular), Sweet Tea, Candies, Chips, Cookies, Sweet Pastries, Store Bought Juices, Alcohol in Excess of 1-2 drinks a day, Artificial Sweeteners, Coffee Creamer, and "Sugar-free" Products. This will help patient to have stable blood glucose profile and potentially avoid unintended weight gain.   - I encouraged the patient to switch to unprocessed or minimally processed complex starch and increased protein intake (animal or plant source), fruits, and vegetables.   - Patient is advised to stick to a routine mealtimes to eat 3 meals a day and avoid unnecessary snacks (to snack only to correct hypoglycemia).  - I have approached him with the following individualized plan to manage diabetes and patient agrees:   - There could be a component of pancreatic endocrine and exocrine insufficiency given his prior history of heavy alcohol use.  Therefore, he may need multiple daily injections of insulin to control diabetes in the near future.  -#1 priority in this patient is to avoid hypoglycemia.    -Based on his tight fasting glucose readings, he is advised to lower his dose of Tresiba to 35 units SQ nightly.   He can continue his Metformin 500 mg po twice daily with meals.  He is good from an endocrine standpoint to undergo shoulder surgery.  -He is advised to monitor blood glucose twice daily, before breakfast and before bed, and call the clinic if he has readings less than 70 or greater than 300 for 3 tests in a row.  -Patient is not a candidate for SGLT2 inhibitors, nor incretin therapy due to his body habitus.  - Patient specific target  A1c;  LDL, HDL, Triglycerides, and  Waist Circumference were discussed in detail.  2) BP/HTN:   His blood pressure is slightly elevated today.  He is not on any antihypertensive medications at this time.  He will be considered for low dose ARB on subsequent visits if BP becomes elevated over 140/90.  He does monitor BP at home and reports normal readings.  3) Lipids/HPL:  His most recent lipid panel from 07/11/20 shows controlled LDL at 57.  He is advised to continue Lipitor 20 mg po daily at bedtime.  Side effects and precautions discussed with him.  Will recheck lipid panel prior to next visit.  4) Left adrenal adenoma- During a past hospitalization for GI bleed from ischemic colitis a CT scan showed incidental finding of 2.1 cm left adrenal adenoma.  Subsequent plasma metanephrines were within normal limits, considered nonfunctional.  He will not need intervention for it at this time. His repeat 24-hr urine metanephrines, catecholamines, cortisol, and aldosterone were  all still normal favoring benignity.    5)  Weight/Diet: His Body mass index is 24.8 kg/m.  He is not a weight loss candidate.  CDE Consult  is in progress , exercise, and detailed carbohydrates information provided.  If he experiences unintentional weight loss, he will be considered for Creon therapy.   6) Chronic Care/Health Maintenance: -he is on Statin medications and  is encouraged to continue to follow up with Ophthalmology, Dentist,  Podiatrist at least yearly or according to  recommendations, and advised to  stay away from smoking. I have recommended yearly flu vaccine and pneumonia vaccination at least every 5 years; moderate intensity exercise for up to 150 minutes weekly; and  sleep for at least 7 hours a day.  - I advised patient to maintain close follow up with Kathyrn Drown, MD for primary care needs.      I spent 30 minutes in the care of the patient today including review of labs from [facility], Lipids, Thyroid Function, Hematology (current and previous including abstractions from other facilities); face-to-face time discussing  his blood glucose readings/logs, discussing hypoglycemia and hyperglycemia episodes and symptoms, medications doses, his options of short and long term treatment based on the latest standards of care / guidelines;  discussion about incorporating lifestyle medicine;  and documenting the encounter.    Please refer to Patient Instructions for Blood Glucose Monitoring and Insulin/Medications Dosing Guide"  in media tab for additional information. Please  also refer to " Patient Self Inventory" in the Media  tab for reviewed elements of pertinent patient history.  Travis [name] participated in the discussions, expressed understanding, and voiced agreement with the above plans.  All questions were answered to his satisfaction. he is encouraged to contact clinic should he have any questions or concerns prior to his return visit.  Follow up plan: - Return in about 4 months (around 07/28/2021) for Diabetes F/U with A1c in office, Previsit labs, Bring meter and logs.  [name], [facility] [address] [place], [place] [address] Phone: [phone] Fax: [phone]  03/30/2021, 11:02 AM

## 2021-03-31 ENCOUNTER — Encounter: Payer: Self-pay | Admitting: Family Medicine

## 2021-03-31 ENCOUNTER — Encounter (HOSPITAL_COMMUNITY): Payer: Self-pay | Admitting: *Deleted

## 2021-03-31 ENCOUNTER — Other Ambulatory Visit: Payer: Self-pay

## 2021-03-31 ENCOUNTER — Emergency Department (HOSPITAL_COMMUNITY)
Admission: EM | Admit: 2021-03-31 | Discharge: 2021-03-31 | Disposition: A | Discharge status: Home / Self Care | Payer: No Typology Code available for payment source | Attending: Emergency Medicine | Admitting: Emergency Medicine

## 2021-03-31 DIAGNOSIS — Z87891 Personal history of nicotine dependence: Secondary | ICD-10-CM | POA: Insufficient documentation

## 2021-03-31 DIAGNOSIS — E1159 Type 2 diabetes mellitus with other circulatory complications: Secondary | ICD-10-CM | POA: Insufficient documentation

## 2021-03-31 DIAGNOSIS — Z7984 Long term (current) use of oral hypoglycemic drugs: Secondary | ICD-10-CM | POA: Diagnosis not present

## 2021-03-31 DIAGNOSIS — I10 Essential (primary) hypertension: Secondary | ICD-10-CM | POA: Diagnosis not present

## 2021-03-31 DIAGNOSIS — Z7901 Long term (current) use of anticoagulants: Secondary | ICD-10-CM | POA: Insufficient documentation

## 2021-03-31 DIAGNOSIS — E1140 Type 2 diabetes mellitus with diabetic neuropathy, unspecified: Secondary | ICD-10-CM | POA: Diagnosis not present

## 2021-03-31 DIAGNOSIS — R519 Headache, unspecified: Secondary | ICD-10-CM | POA: Diagnosis present

## 2021-03-31 DIAGNOSIS — U071 COVID-19: Secondary | ICD-10-CM | POA: Diagnosis not present

## 2021-03-31 DIAGNOSIS — Z794 Long term (current) use of insulin: Secondary | ICD-10-CM | POA: Insufficient documentation

## 2021-03-31 DIAGNOSIS — I4891 Unspecified atrial fibrillation: Secondary | ICD-10-CM | POA: Insufficient documentation

## 2021-03-31 LAB — CBC WITH DIFFERENTIAL/PLATELET
Abs Immature Granulocytes: 0.01 10*3/uL (ref 0.00–0.07)
Basophils Absolute: 0 10*3/uL (ref 0.0–0.1)
Basophils Relative: 0 %
Eosinophils Absolute: 0 10*3/uL (ref 0.0–0.5)
Eosinophils Relative: 0 %
HCT: 39.3 % (ref 39.0–52.0)
Hemoglobin: 13.4 g/dL (ref 13.0–17.0)
Immature Granulocytes: 0 %
Lymphocytes Relative: 5 %
Lymphs Abs: 0.3 10*3/uL — ABNORMAL LOW (ref 0.7–4.0)
MCH: 34 pg (ref 26.0–34.0)
MCHC: 34.1 g/dL (ref 30.0–36.0)
MCV: 99.7 fL (ref 80.0–100.0)
Monocytes Absolute: 0.8 10*3/uL (ref 0.1–1.0)
Monocytes Relative: 17 %
Neutro Abs: 3.9 10*3/uL (ref 1.7–7.7)
Neutrophils Relative %: 78 %
Platelets: 180 10*3/uL (ref 150–400)
RBC: 3.94 MIL/uL — ABNORMAL LOW (ref 4.22–5.81)
RDW: 12.5 % (ref 11.5–15.5)
WBC: 5.1 10*3/uL (ref 4.0–10.5)
nRBC: 0 % (ref 0.0–0.2)

## 2021-03-31 LAB — BASIC METABOLIC PANEL
Anion gap: 8 (ref 5–15)
BUN: 10 mg/dL (ref 8–23)
CO2: 24 mmol/L (ref 22–32)
Calcium: 8.6 mg/dL — ABNORMAL LOW (ref 8.9–10.3)
Chloride: 107 mmol/L (ref 98–111)
Creatinine, Ser: 0.86 mg/dL (ref 0.61–1.24)
GFR, Estimated: >60 mL/min (ref >60)
Glucose, Bld: 133 mg/dL — ABNORMAL HIGH (ref 70–99)
Potassium: 4 mmol/L (ref 3.5–5.1)
Sodium: 139 mmol/L (ref 135–145)

## 2021-03-31 LAB — RESP PANEL BY RT-PCR (FLU A&B, COVID) ARPGX2
Influenza A by PCR: NEGATIVE
Influenza B by PCR: NEGATIVE
SARS Coronavirus 2 by RT PCR: POSITIVE — AB

## 2021-03-31 MED ORDER — MOLNUPIRAVIR EUA 200MG CAPSULE
4.0000 | ORAL_CAPSULE | Freq: Two times a day (BID) | ORAL | 0 refills | Status: AC
Start: 2021-03-31 — End: 2021-04-05

## 2021-03-31 MED ORDER — ACETAMINOPHEN 500 MG PO TABS
1000.0000 mg | ORAL_TABLET | Freq: Once | ORAL | Status: AC
  Administered 2021-03-31: 1000 mg via ORAL

## 2021-03-31 NOTE — ED Notes (Signed)
Pt placed on cardiac monitor with BP to set cycle every 30 minutes. Continuous pulse oximeter applied.  

## 2021-03-31 NOTE — ED Triage Notes (Signed)
Dizziness, headache, and body aches

## 2021-03-31 NOTE — ED Provider Notes (Addendum)
[facility] EMERGENCY DEPARTMENT Provider Note   CSN: [id number] Arrival date & time: 03/31/21  1620     History Chief Complaint  Patient presents with   Dizziness    Travis [name] is a 60 y.o. male with history significant for multiple prior strokes, atrial fibrillation on Eliquis, history of TBI, hypertension, type 2 diabetes with vascular disease, s/p cervical fusion with chronic pain, history of chronic LLE DVT history of GI bleed ischemic colitis who presents today for evaluation of severe occipital headaches, generalized joint pain, nausea and vomiting with sudden onset last night.  Patient was diagnosed with RSV 2 weeks ago.  Per chart review, he was not improving  and was prescribed a course of Omnicef for 1 week.  Last night, patient states that he was lying in bed trying to sleep when he suddenly developed a severe headache.  He has not taken any medications at home to alleviate his pain.  He endorses numerous loose but not watery stools, posterior neck pain and photophobia.  He denies focal deficits, unilateral weakness, facial droop.  Wife is at bedside and also agrees.  He denies fever, chills, shortness of breath, chest pain, urinary symptoms, numbness and tingling.     Dizziness Associated symptoms: headaches   Associated symptoms: no shortness of breath and no vomiting       Past Medical History:  Diagnosis Date   A-fib ([place]) 02/2016   found on loop recorder   Adhesive capsulitis of left shoulder 08/02/2014   Anxiety    Arthritis    Blood transfusion without reported diagnosis    Chronic headaches    Complication of anesthesia    bleed after last shoulder-had to stay overnight   Depression    Diabetes mellitus    Diabetic peripheral neuropathy ([place]) 03/28/2013   Diverticulosis    Dizziness    Hernia, inguinal    Hyperlipidemia    Hypertension    Impingement syndrome of left shoulder 08/02/2014   Ischemic colitis ([place])    Multi-infarct state 10/14/2014    Neuropathy of lower extremity    Night sweats    every once in a while   Sleep apnea    no CPAP   Stroke ([place]) 02/2015   Syncope and collapse     Patient Active Problem List   Diagnosis Date Noted   Right groin hernia 03/03/2021   Hyperlipidemia associated with type 2 diabetes mellitus ([place]) 12/25/2020   Tendinopathy of right rotator cuff 09/25/2020   Arthrosis of right acromioclavicular joint 09/25/2020   Adrenal adenoma, left 09/07/2019   Chronic anticoagulation    GI bleed 06/14/2019   Vitamin D deficiency 04/18/2019   History of stroke 04/20/2018   GAD (generalized anxiety disorder) 05/15/2016   Acute bronchiolitis due to respiratory syncytial virus (RSV)    Acute CVA (cerebrovascular accident) ([place]) 05/04/2016   Dizziness 05/02/2016   Anxiety and depression 05/02/2016   Paroxysmal atrial fibrillation ([place]) 03/02/2016   PFO (patent foramen ovale) 07/24/2015   OSA (obstructive sleep apnea) 04/18/2015   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 artery ([place]) 04/18/2015   Cerebellar stroke ([place]) 02/28/2015   Snoring 01/23/2015   Degeneration of cervical intervertebral disc 01/23/2015   Essential hypertension 12/14/2014   DM type 2 causing vascular disease ([place]) 12/14/2014   Neck pain 12/14/2014   Cerebral infarction, chronic    Multi-infarct state 10/14/2014   Impingement syndrome of left shoulder 08/02/2014   Adhesive capsulitis of left shoulder 08/02/2014  Rectal bleeding 03/13/2014   Diabetes type 2, controlled ([place]) 02/18/2014   Diabetic peripheral neuropathy ([place]) 03/28/2013   Irreducible incisional hernia 11/11/2010    Past Surgical History:  Procedure Laterality Date   BIOPSY  06/14/2019   Procedure: BIOPSY;  Surgeon: [name], MD;  Location: [facility];  Service: Gastroenterology;;   CERVICAL [place]  2003   1/3 removed for diverticulitis   COLONOSCOPY     COLONOSCOPY WITH PROPOFOL N/A 06/14/2019    Procedure: COLONOSCOPY WITH PROPOFOL;  Surgeon: [name], MD;  Location: [facility];  Service: Gastroenterology;  Laterality: N/A;   EP IMPLANTABLE DEVICE N/A 05/01/2015   Procedure: Loop Recorder Insertion;  Surgeon: [name], MD;  Location: [facility];  Service: Cardiovascular;  Laterality: N/A;   INGUINAL HERNIA REPAIR Bilateral    KNEE ARTHROSCOPY Right    SHOULDER ARTHROSCOPY Right    1   SHOULDER ARTHROSCOPY Left 08/02/2014   Procedure: LEFT SHOULDER SCOPE DEBRIDEMENT/ACROMIOPLASTY;  Surgeon: [name], MD;  Location: [facility];  Service: Orthopedics;  Laterality: Left;  ANESTHESIA: GENERAL, PRE/POST OP SCALENE   TEE WITHOUT CARDIOVERSION N/A 03/03/2015   Procedure: TRANSESOPHAGEAL ECHOCARDIOGRAM (TEE);  Surgeon: [name], MD;  [facility];  Service: Cardiology;  Laterality: N/A;   UMBILICAL HERNIA REPAIR     with other hernia repair with mesh   WRIST SURGERY Right    fusion       Family History  Problem Relation Age of Onset   Stroke Father    Hyperlipidemia Father    Heart attack Sister 32   Stroke Sister    Dementia Mother    ALS Brother        age 32   Diabetes Maternal Grandfather    Colon cancer Neg Hx    Esophageal cancer Neg Hx    Stomach cancer Neg Hx    Rectal cancer Neg Hx     Social History   Tobacco Use   Smoking status: Former    Packs/day: 1.00    Years: 8.00    Pack years: 8.00    Types: Cigarettes    Start date: 06/07/1970    Quit date: 05/03/1978    Years since quitting: 42.9   Smokeless tobacco: Former    Types: Chew    Quit date: 05/03/1978  Vaping Use   Vaping Use: Never used  Substance Use Topics   Alcohol use: No    Alcohol/week: 1.0 standard drink    Types: 1 Cans of beer per week    Comment: h/o heavy use in the past   Drug use: No    Home Medications Prior to Admission medications   Medication Sig Start Date End Date Taking? Authorizing Provider  Accu-Chek  FastClix Lancets MISC USE TO CHECK BLOOD SUGAR 2 TIMES A DAY 12/15/20 12/15/21  Cassandria Anger, MD  ALPRAZolam Duanne Moron) 0.25 MG tablet TAKE 1 TABLET BY MOUTH 3 TIMES DAILY AS NEEDED 12/24/20 06/22/21  Kathyrn Drown, MD  apixaban (ELIQUIS) 5 MG TABS tablet TAKE 1 TABLET BY MOUTH 2 TIMES DAILY. 08/06/20 08/06/21  Sherran Needs, NP  atorvastatin (LIPITOR) 20 MG tablet Take 1 tablet (20 mg total) by mouth daily. 11/13/20   Kathyrn Drown, MD  Blood Glucose Monitoring Suppl (BLOOD GLUCOSE MONITOR SYSTEM) w/Device KIT Test glucose 2 time daily.  Meter/strips/lancets. Please dispense per patient/insurance preferrence 05/18/18   Cassandria Anger,  MD  Blood Glucose Monitoring Suppl (FREESTYLE LITE) w/Device KIT USE AS DIRECTED 2 TIMES DAILY 05/21/20 05/21/21  Brita Romp, NP  Blood Pressure Monitoring (OMRON 3 SERIES BP MONITOR) DEVI USE AS DIRECTED. 11/28/20   Edmon Crape, [facility]  cefdinir (OMNICEF) 300 MG capsule Take one capsule po twice daily for 7 days 03/25/21   Kathyrn Drown, MD  Cholecalciferol 50 MCG (2000 UT) CAPS Take 1 capsule (2,000 Units total) by mouth daily with breakfast. 05/17/18   Nida, Marella Chimes, MD  FLUoxetine (PROZAC) 20 MG capsule Take 1 capsule (20 mg total) by mouth daily. 11/13/20   Kathyrn Drown, MD  fluticasone (FLONASE) 50 MCG/ACT nasal spray Place 1 spray into both nostrils daily. 08/18/20   Kathyrn Drown, MD  folic acid (FOLVITE) 1 MG tablet TAKE 1 TABLET BY MOUTH ONCE A DAY 03/09/21 03/09/22  Kathyrn Drown, MD  gabapentin (NEURONTIN) 300 MG capsule TAKE 1 CAPSULE BY MOUTH IN THE MORNING, 1 CAPSULE BY MOUTH IN THE AFTERNOON THEN 2 CAPSULES NIGHTLY 11/24/20 11/24/21  McCue, Janett Billow, NP  glucose blood test strip USE AS DIRECTED TO TEST BLOOD SUGAR 2 TIMES DAILY Patient taking differently: USE AS DIRECTED TO TEST BLOOD SUGAR 2 TIMES DAILY 05/21/20 05/21/21  Brita Romp, NP  hyoscyamine (LEVSIN SL) 0.125 MG SL tablet Place 1 tablet (0.125 mg total) under the  tongue daily before breakfast. 06/04/19   Esterwood, Amy S, PA-C  insulin degludec (TRESIBA) 100 UNIT/ML FlexTouch Pen Inject 35 Units into the skin at bedtime. 03/30/21 03/30/22  Brita Romp, NP  Insulin Pen Needle (UNIFINE PENTIPS) 31G X 6 MM MISC USE DAILY AS DIRECTED 01/19/21 01/19/22  Cassandria Anger, MD  Lancets (FREESTYLE) lancets USE AS DIRECTED 2 TIMES DAILY TO CHECK BLOOD SUGAR Patient taking differently: USE AS DIRECTED 2 TIMES DAILY TO CHECK BLOOD SUGAR 05/21/20 05/21/21  Brita Romp, NP  loratadine (CLARITIN) 10 MG tablet TAKE 1 TABLET BY MOUTH DAILY 07/11/20 07/11/21  Kathyrn Drown, MD  metFORMIN (GLUCOPHAGE) 500 MG tablet TAKE 1 TABLET BY MOUTH TWO TIMES DAILY WITH A MEAL 01/26/21 01/26/22  Brita Romp, NP  Multiple Vitamin (MULTIVITAMIN WITH MINERALS) TABS tablet Take 1 tablet daily by mouth.    [provider]  valACYclovir (VALTREX) 1000 MG tablet Take 1 tablet (1,000 mg total) by mouth daily. 11/13/20   Kathyrn Drown, MD  Insulin Glargine [facility] KWIKPEN) 100 UNIT/ML INJECT 50 UNITS INTO THE SKIN AT BEDTIME 06/20/20 07/15/20  Brita Romp, NP    Allergies    Lisinopril  Review of Systems   Review of Systems  Constitutional:  Negative for fever.  HENT: Negative.    Eyes: Negative.   Respiratory:  Negative for shortness of breath.   Cardiovascular: Negative.   Gastrointestinal:  Negative for abdominal pain and vomiting.  Endocrine: Negative.   Genitourinary: Negative.   Musculoskeletal:  Positive for arthralgias, back pain, myalgias and neck pain.  Skin:  Negative for rash.  Neurological:  Positive for dizziness and headaches. Negative for speech difficulty and numbness.  All other systems reviewed and are negative.  Physical Exam Updated Vital Signs BP (!) 152/101   Pulse 89   Temp 99.2 F (37.3 C) (Oral)   Resp 15   SpO2 97%   Physical Exam Vitals and nursing note reviewed.  Constitutional:      General: He is not in  acute distress.    Appearance: He is ill-appearing.  HENT:  Head: Atraumatic.  Eyes:     Extraocular Movements: Extraocular movements intact.     Conjunctiva/sclera: Conjunctivae normal.     Pupils: Pupils are equal, round, and reactive to light.  Neck:     Comments: No C-spine tenderness.  ROM intact.  No meningeal signs Cardiovascular:     Rate and Rhythm: Normal rate and regular rhythm.     Pulses: Normal pulses.          Radial pulses are 2+ on the right side and 2+ on the left side.       Dorsalis pedis pulses are 2+ on the right side and 2+ on the left side.     Heart sounds: No murmur heard. Pulmonary:     Effort: Pulmonary effort is normal. No respiratory distress.     Breath sounds: Normal breath sounds.     Comments: Lungs clear to auscultation symmetric bilaterally.  Respiratory rate WNL with no accessory muscle usage.  No acute respiratory distress. Abdominal:     General: Abdomen is flat. There is no distension.     Palpations: Abdomen is soft.     Tenderness: There is no abdominal tenderness.     Comments: Abdomen is soft nondistended.  Mild tenderness to palpation of the umbilical region.  Musculoskeletal:        General: Normal range of motion.     Cervical back: Normal range of motion. No rigidity.     Right lower leg: No edema.     Left lower leg: No edema.     Comments: No thoracic or lumbar bony tenderness.  No contusions, erythema, or obvious deformities.  Skin:    General: Skin is warm and dry.     Capillary Refill: Capillary refill takes less than 2 seconds.  Neurological:     General: No focal deficit present.     Mental Status: He is alert and oriented to person, place, and time.     Comments: Cranial nerves III through XII intact  Psychiatric:        Mood and Affect: Mood normal.    ED Results / Procedures / Treatments   Labs (all labs ordered are listed, but only abnormal results are displayed) Labs Reviewed  CBC WITH DIFFERENTIAL/PLATELET -  Abnormal; Notable for the following components:      Result Value   RBC 3.94 (*)    Lymphs Abs 0.3 (*)    All other components within normal limits  RESP PANEL BY RT-PCR (FLU A&B, COVID) ARPGX2  BASIC METABOLIC PANEL    EKG None  Radiology No results found.  Procedures Procedures   Medications Ordered in ED Medications  acetaminophen (TYLENOL) tablet 1,000 mg (has no administration in time range)    ED Course  I have reviewed the triage vital signs and the nursing notes.  Pertinent labs & imaging results that were available during my care of the patient were reviewed by me and considered in my medical decision making (see chart for details).    MDM Rules/Calculators/A&P                         Initial impression: Presents with sudden onset severe occipital headache last night.  Vitals were stable.  Physical exam largely normal.  Eyes PERRLA, neurovascularly intact.  No focal deficits.   Workup: Respiratory panel, BMP and CBC unremarkable  Reassessment: Patient is COVID-positive.  He was given Tylenol without significant symptomatic relief, however his vitals remained normal.  Physical exam reassuring.  Rule-out: Given sudden onset of patient's headache and significant vascular history, I am concerned about potential IC hemorrhage.  However, given his COVID-positive test and overall reassuring vitals, he is nontachypneic, nontachycardic, not hypotensive, and appears nontoxic, I am less concerned about this is a pathology.  Less suspicion for stroke, given patient's reassuring physical exam and history, no focal deficits no unilateral weakness no facial droop.  Patient does not have a history of migraines, although he does endorse photophobia.    Plan:  COVID-19-due to patient's numerous comorbidities, we will send in a prescription for molnupiravir.  I consulted with pharmacy, patient is not a good candidate for Paxlovid due to medication interactions.  Patient discharged home  in good condition with instructions for at home care and educated on symptoms warrant return to the ED.  Patient amenable to plan discharged home in good condition  Final Clinical Impression(s) / ED Diagnoses Final diagnoses:  COVID    Rx / DC Orders ED Discharge Orders          Ordered    molnupiravir EUA (LAGEVRIO) 200 mg CAPS capsule  2 times daily        03/31/21 1914             Tonye Pearson, [place] 03/31/21 1842    Rodena Piety 03/31/21 Maryan Rued, MD 04/02/21 [phone]

## 2021-03-31 NOTE — ED Notes (Signed)
Patient has multiple complaints and tested positive for rsv a couple of weeks ago.

## 2021-03-31 NOTE — Discharge Instructions (Addendum)
You were diagnosed today with COVID-19 which is likely the cause of your headache and general joint pains.  I have attached information here to help you better manage your symptoms at home.  I have also sent in a prescription for an antiviral medication that I recommend to start tonight.  Please return if you have increased shortness of breath, difficulty breathing or fever that cannot be managed with Motrin or Tylenol.  Hopefully better soon!

## 2021-04-01 ENCOUNTER — Encounter: Payer: Self-pay | Admitting: Family Medicine

## 2021-04-01 ENCOUNTER — Ambulatory Visit: Payer: No Typology Code available for payment source | Admitting: Nurse Practitioner

## 2021-04-08 ENCOUNTER — Encounter: Payer: Self-pay | Admitting: Family Medicine

## 2021-04-14 ENCOUNTER — Other Ambulatory Visit: Payer: Self-pay

## 2021-04-14 ENCOUNTER — Ambulatory Visit (INDEPENDENT_AMBULATORY_CARE_PROVIDER_SITE_OTHER): Payer: No Typology Code available for payment source | Admitting: Family Medicine

## 2021-04-14 VITALS — BP 134/78

## 2021-04-14 DIAGNOSIS — U099 Post covid-19 condition, unspecified: Secondary | ICD-10-CM

## 2021-04-14 NOTE — Progress Notes (Signed)
° °  Subjective:    Patient ID: Travis [name], male    DOB: 11-03-1960, 60 y.o.   MRN: [id number]  HPI  Patient presents today with respiratory illness Number of days present-2 weeks  Symptoms include- cough headache body aches chills  Presence of worrisome signs (severe shortness of breath, lethargy, etc.) - no doe sob  Recent/current visit to urgent care or ER- in  dx with covid  Recent direct exposure to Covid- see ED note  Any current Covid testing- was positive 2 weeks ago    Review of Systems     Objective:   Physical Exam Gen-NAD not toxic TMS-normal bilateral T- normal no redness Chest-CTA respiratory rate normal no crackles CV RRR no murmur Skin-warm dry Neuro-grossly normal        Assessment & Plan:  Post-COVID symptoms of some intermittent chest congestion but no sign of pneumonia on today's visit.  I do not recommend blood work or scans currently warning signs were told him what to watch for call back if any ongoing troubles or problems otherwise follow-up in the spring

## 2021-04-17 ENCOUNTER — Ambulatory Visit: Payer: No Typology Code available for payment source | Admitting: Gastroenterology

## 2021-04-18 ENCOUNTER — Other Ambulatory Visit: Payer: Self-pay | Admitting: Family Medicine

## 2021-04-18 ENCOUNTER — Other Ambulatory Visit (HOSPITAL_COMMUNITY): Payer: Self-pay

## 2021-04-20 ENCOUNTER — Other Ambulatory Visit (HOSPITAL_COMMUNITY): Payer: Self-pay

## 2021-04-20 MED ORDER — ATORVASTATIN CALCIUM 20 MG PO TABS
20.0000 mg | ORAL_TABLET | Freq: Every day | ORAL | 0 refills | Status: DC
  Filled 2021-04-20: qty 90, 90d supply, fill #0

## 2021-04-20 MED ORDER — VALACYCLOVIR HCL 1 G PO TABS
1000.0000 mg | ORAL_TABLET | Freq: Every day | ORAL | 0 refills | Status: DC
  Filled 2021-04-20: qty 90, 90d supply, fill #0

## 2021-04-20 MED ORDER — FOLIC ACID 1 MG PO TABS
1.0000 mg | ORAL_TABLET | Freq: Every day | ORAL | 0 refills | Status: DC
  Filled 2021-04-20 – 2021-05-18 (×2): qty 90, 90d supply, fill #0

## 2021-04-22 ENCOUNTER — Other Ambulatory Visit (HOSPITAL_COMMUNITY): Payer: Self-pay

## 2021-04-28 ENCOUNTER — Ambulatory Visit: Payer: No Typology Code available for payment source | Admitting: Family Medicine

## 2021-04-29 ENCOUNTER — Telehealth: Payer: Self-pay | Admitting: Family Medicine

## 2021-04-29 NOTE — Telephone Encounter (Signed)
FYI: FMLA paper work in [employer].

## 2021-05-04 ENCOUNTER — Other Ambulatory Visit (HOSPITAL_COMMUNITY): Payer: Self-pay

## 2021-05-10 NOTE — Telephone Encounter (Signed)
I needed clarification on this FMLA what was being requested by the family? Are they needing FMLA so that they can go with him to doctor appointments? Is it so that he can does not have to go back to work? Is it so that [name] can leave her work if she needs to for his health purposes? Please see FMLA Once he is additional questions are answered forward back to me thank you along with the form thank you

## 2021-05-12 ENCOUNTER — Other Ambulatory Visit (INDEPENDENT_AMBULATORY_CARE_PROVIDER_SITE_OTHER): Payer: No Typology Code available for payment source

## 2021-05-12 ENCOUNTER — Ambulatory Visit: Payer: No Typology Code available for payment source | Admitting: Physician Assistant

## 2021-05-12 ENCOUNTER — Encounter: Payer: Self-pay | Admitting: Physician Assistant

## 2021-05-12 VITALS — BP 140/70 | HR 76 | Ht 73.0 in | Wt 192.8 lb

## 2021-05-12 DIAGNOSIS — K625 Hemorrhage of anus and rectum: Secondary | ICD-10-CM

## 2021-05-12 DIAGNOSIS — R109 Unspecified abdominal pain: Secondary | ICD-10-CM

## 2021-05-12 DIAGNOSIS — Z8719 Personal history of other diseases of the digestive system: Secondary | ICD-10-CM | POA: Diagnosis not present

## 2021-05-12 LAB — CBC WITH DIFFERENTIAL/PLATELET
Basophils Absolute: 0.1 10*3/uL (ref 0.0–0.1)
Basophils Relative: 0.8 % (ref 0.0–3.0)
Eosinophils Absolute: 0.3 10*3/uL (ref 0.0–0.7)
Eosinophils Relative: 4.6 % (ref 0.0–5.0)
HCT: 40.6 % (ref 39.0–52.0)
Hemoglobin: 13.7 g/dL (ref 13.0–17.0)
Lymphocytes Relative: 22.2 % (ref 12.0–46.0)
Lymphs Abs: 1.6 10*3/uL (ref 0.7–4.0)
MCHC: 33.8 g/dL (ref 30.0–36.0)
MCV: 97 fl (ref 78.0–100.0)
Monocytes Absolute: 0.8 10*3/uL (ref 0.1–1.0)
Monocytes Relative: 11.2 % (ref 3.0–12.0)
Neutro Abs: 4.4 10*3/uL (ref 1.4–7.7)
Neutrophils Relative %: 61.2 % (ref 43.0–77.0)
Platelets: 192 10*3/uL (ref 150.0–400.0)
RBC: 4.18 Mil/uL — ABNORMAL LOW (ref 4.22–5.81)
RDW: 13.6 % (ref 11.5–15.5)
WBC: 7.3 10*3/uL (ref 4.0–10.5)

## 2021-05-12 LAB — COMPREHENSIVE METABOLIC PANEL
ALT: 10 U/L (ref 0–53)
AST: 17 U/L (ref 0–37)
Albumin: 4.3 g/dL (ref 3.5–5.2)
Alkaline Phosphatase: 70 U/L (ref 39–117)
BUN: 17 mg/dL (ref 6–23)
CO2: 28 mEq/L (ref 19–32)
Calcium: 9.5 mg/dL (ref 8.4–10.5)
Chloride: 106 mEq/L (ref 96–112)
Creatinine, Ser: 0.99 mg/dL (ref 0.40–1.50)
GFR: 81.4 mL/min (ref >60.00)
Glucose, Bld: 141 mg/dL — ABNORMAL HIGH (ref 70–99)
Potassium: 4.1 mEq/L (ref 3.5–5.1)
Sodium: 141 mEq/L (ref 135–145)
Total Bilirubin: 0.8 mg/dL (ref 0.2–1.2)
Total Protein: 7.1 g/dL (ref 6.0–8.3)

## 2021-05-12 LAB — SEDIMENTATION RATE: Sed Rate: 7 mm/hr (ref 0–20)

## 2021-05-12 NOTE — Patient Instructions (Addendum)
If you are age 63 or younger, your body mass index should be between 19-25. Your Body mass index is 25.44 kg/m. If this is out of the aformentioned range listed, please consider follow up with your Primary Care provider.  ________________________________________________________  The [place] [facility] providers would like to encourage you to use [facility] to communicate with providers for non-urgent requests or questions.  Due to long hold times on the telephone, sending your provider a message by [facility] may be a faster and more efficient way to get a response.  Please allow 48 business hours for a response.  Please remember that this is for non-urgent requests.  _______________________________________________________ Dennis Bast have been scheduled for a CT scan of the abdomen and pelvis at [facility] ([address]---this is in the same building as [employer]).   You are scheduled on 05/15/2021 at 9:00 am. You should arrive 15 minutes prior to your appointment time for registration. Please follow the written instructions below on the day of your exam:  WARNING: IF YOU ARE ALLERGIC TO IODINE/X-RAY DYE, PLEASE NOTIFY RADIOLOGY IMMEDIATELY AT [phone]! YOU WILL BE GIVEN A 13 HOUR PREMEDICATION PREP.  1) Do not eat anything after 5:00 am (4 hours prior to your test) 2) You have been given 2 bottles of oral contrast to drink. The solution may taste better if refrigerated, but do NOT add ice or any other liquid to this solution. Shake well before drinking.    Drink 1 bottle of contrast @ 7:00 am (2 hours prior to your exam)  Drink 1 bottle of contrast @ 8:00 am (1 hour prior to your exam)  You may take any medications as prescribed with a small amount of water, if necessary. If you take any of the following medications: METFORMIN, GLUCOPHAGE, GLUCOVANCE, AVANDAMET, RIOMET, FORTAMET, [place] MET, JANUMET, GLUMETZA or METAGLIP, you MAY be asked to HOLD this medication 48 hours AFTER the  exam.  The purpose of you drinking the oral contrast is to aid in the visualization of your intestinal tract. The contrast solution may cause some diarrhea. Depending on your individual set of symptoms, you may also receive an intravenous injection of x-ray contrast/dye. Plan on being at [facility] for 30 minutes or longer, depending on the type of exam you are having performed.  This test typically takes 30-45 minutes to complete.  If you have any questions regarding your exam or if you need to reschedule, you may call the CT department at [phone] between the hours of 8:00 am and 5:00 pm, Monday-Friday.  ________________________________________________________________________  Your provider has requested that you go to the basement level for lab work before leaving today. Press "B" on the elevator. The lab is located at the first door on the left as you exit the elevator.  Follow up pending the results of your CT.  Thank you for entrusting me with your care and choosing [facility].  Amy Esterwood, PA-C

## 2021-05-13 ENCOUNTER — Encounter: Payer: Self-pay | Admitting: Physician Assistant

## 2021-05-13 NOTE — Progress Notes (Signed)
Noted  

## 2021-05-13 NOTE — Progress Notes (Signed)
Subjective:    Patient ID: Travis [name], male    DOB: [birth year], 61 y.o.   MRN: [id number]  HPI Travis [name] is a pleasant 61 year old white male, established with Dr. Henrene Pastor, who comes in today with complaints of upper abdominal pain onset about 6 days ago, associated more frequent bowel movements and had noted some blood in his stool 1 time last week.  He also mentions that he saw some blood intermittently on the tissue a few times about 2 months ago. Patient is on chronic Eliquis, has history of prior CVA, atrial fibrillation, PFO, sleep apnea, adult onset diabetes mellitus, hypertension.  He had an episode in spring 2021 with acute ischemic colitis with colonoscopy at that time showing diffuse severe edema from the transverse colon to the cecum and biopsies were consistent with ischemia.  He says he had similar abdominal discomfort with that episode but more overt bleeding. He is also status post remote sigmoid resection for diverticulitis. He also mentions that he had recently noticed some pressure and discomfort in his right groin with certain movements and with coughing.  He has been seen by his PCP and diagnosed with a recurrent right inguinal hernia. He says that his wife has been worried with this most recent episode, because he got so sick and required hospitalization with the ischemic colitis. Today he states that when he had onset of the symptoms last week it occurred about 2 hours after eating dinner, he describes it as a diffuse pressure and discomfort in the upper abdomen more to the right side.  This would last for a couple of hours he has not had any associated nausea or vomiting, appetite has been okay, he has noted an significant increase in bowel movements usually has a bowel movement 2-3 times daily and has been going 4-8 times per day, this has not been diarrhea, and again no melena or overt hematochezia.  Pain has been present most evenings after eating and less prevalent during the  day. No aspirin or NSAID use, no new medications He says he feels a little bit better today than he did yesterday.   Most recent labs 03/31/2021 hemoglobin 13.4/crit 39.3  Review of Systems. Pertinent positive and negative review of systems were noted in the above HPI section.  All other review of systems was otherwise negative.   Outpatient Encounter Medications as of 05/12/2021  Medication Sig   Accu-Chek FastClix Lancets MISC USE TO CHECK BLOOD SUGAR 2 TIMES A DAY   ALPRAZolam (XANAX) 0.25 MG tablet TAKE 1 TABLET BY MOUTH 3 TIMES DAILY AS NEEDED   apixaban (ELIQUIS) 5 MG TABS tablet TAKE 1 TABLET BY MOUTH 2 TIMES DAILY.   atorvastatin (LIPITOR) 20 MG tablet Take 1 tablet (20 mg total) by mouth daily.   Blood Glucose Monitoring Suppl (BLOOD GLUCOSE MONITOR SYSTEM) w/Device KIT Test glucose 2 time daily.  Meter/strips/lancets. Please dispense per patient/insurance preferrence   Blood Glucose Monitoring Suppl (FREESTYLE LITE) w/Device KIT USE AS DIRECTED 2 TIMES DAILY   Blood Pressure Monitoring (OMRON 3 SERIES BP MONITOR) DEVI USE AS DIRECTED.   cefdinir (OMNICEF) 300 MG capsule Take one capsule po twice daily for 7 days   Cholecalciferol 50 MCG (2000 UT) CAPS Take 1 capsule (2,000 Units total) by mouth daily with breakfast.   FLUoxetine (PROZAC) 20 MG capsule Take 1 capsule (20 mg total) by mouth daily.   fluticasone (FLONASE) 50 MCG/ACT nasal spray Place 1 spray into both nostrils daily.   folic acid (FOLVITE)  1 MG tablet Take 1 tablet (1 mg total) by mouth daily.   gabapentin (NEURONTIN) 300 MG capsule TAKE 1 CAPSULE BY MOUTH IN THE MORNING, 1 CAPSULE BY MOUTH IN THE AFTERNOON THEN 2 CAPSULES NIGHTLY   glucose blood test strip USE AS DIRECTED TO TEST BLOOD SUGAR 2 TIMES DAILY (Patient taking differently: USE AS DIRECTED TO TEST BLOOD SUGAR 2 TIMES DAILY)   hyoscyamine (LEVSIN SL) 0.125 MG SL tablet Place 1 tablet (0.125 mg total) under the tongue daily before breakfast.   insulin  degludec (TRESIBA) 100 UNIT/ML FlexTouch Pen Inject 35 Units into the skin at bedtime.   Insulin Pen Needle (UNIFINE PENTIPS) 31G X 6 MM MISC USE DAILY AS DIRECTED   Lancets (FREESTYLE) lancets USE AS DIRECTED 2 TIMES DAILY TO CHECK BLOOD SUGAR (Patient taking differently: USE AS DIRECTED 2 TIMES DAILY TO CHECK BLOOD SUGAR)   loratadine (CLARITIN) 10 MG tablet TAKE 1 TABLET BY MOUTH DAILY   metFORMIN (GLUCOPHAGE) 500 MG tablet TAKE 1 TABLET BY MOUTH TWO TIMES DAILY WITH A MEAL   Multiple Vitamin (MULTIVITAMIN WITH MINERALS) TABS tablet Take 1 tablet daily by mouth.   valACYclovir (VALTREX) 1000 MG tablet Take 1 tablet (1,000 mg total) by mouth daily.   [DISCONTINUED] Insulin Glargine (BASAGLAR KWIKPEN) 100 UNIT/ML INJECT 50 UNITS INTO THE SKIN AT BEDTIME   No facility-administered encounter medications on file as of 05/12/2021.   Allergies  Allergen Reactions   Lisinopril Cough    Patient/spouse is not aware/familiar with why this is listed as an allergy Not a true allergy but on this list to avoid ACE Marcha Dutton MD   Patient Active Problem List   Diagnosis Date Noted   Right groin hernia 03/03/2021   Hyperlipidemia associated with type 2 diabetes mellitus ([place]) 12/25/2020   Tendinopathy of right rotator cuff 09/25/2020   Arthrosis of right acromioclavicular joint 09/25/2020   Adrenal adenoma, left 09/07/2019   Chronic anticoagulation    GI bleed 06/14/2019   Vitamin D deficiency 04/18/2019   History of stroke 04/20/2018   GAD (generalized anxiety disorder) 05/15/2016   Acute bronchiolitis due to respiratory syncytial virus (RSV)    Acute CVA (cerebrovascular accident) ([place]) 05/04/2016   Dizziness 05/02/2016   Anxiety and depression 05/02/2016   Paroxysmal atrial fibrillation ([place]) 03/02/2016   PFO (patent foramen ovale) 07/24/2015   OSA (obstructive sleep apnea) 04/18/2015   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 artery ([place]) 04/18/2015   Cerebellar  stroke ([place]) 02/28/2015   Snoring 01/23/2015   Degeneration of cervical intervertebral disc 01/23/2015   Essential hypertension 12/14/2014   DM type 2 causing vascular disease ([place]) 12/14/2014   Neck pain 12/14/2014   Cerebral infarction, chronic    Multi-infarct state 10/14/2014   Impingement syndrome of left shoulder 08/02/2014   Adhesive capsulitis of left shoulder 08/02/2014   Rectal bleeding 03/13/2014   Diabetes type 2, controlled ([place]) 02/18/2014   Diabetic peripheral neuropathy ([place]) 03/28/2013   Irreducible incisional hernia 11/11/2010   Social History   Socioeconomic History   Marital status: Married    Spouse name: Not on file   Number of children: 2   Years of education: College   Highest education level: Not on file  Occupational History   Occupation: disabled  Tobacco Use   Smoking status: Former    Packs/day: 1.00    Years: 8.00    Pack years: 8.00    Types: Cigarettes    Start date: 06/07/1970    Quit  date: 05/03/1978    Years since quitting: 43.0   Smokeless tobacco: Former    Types: Chew    Quit date: 05/03/1978  Vaping Use   Vaping Use: Never used  Substance and Sexual Activity   Alcohol use: No    Alcohol/week: 1.0 standard drink    Types: 1 Cans of beer per week    Comment: h/o heavy use in the past   Drug use: No   Sexual activity: Yes  Other Topics Concern   Not on file  Social History Narrative   Drinks some coffee, Drink diet sodas and tea   Social Determinants of Health   Financial Resource Strain: Not on file  Food Insecurity: Not on file  Transportation Needs: Not on file  Physical Activity: Not on file  Stress: Not on file  Social Connections: Not on file  Intimate Partner Violence: Not on file    Travis [name] family history includes ALS in his brother; Dementia in his mother; Diabetes in his maternal grandfather; Heart attack (age of onset: 21) in his sister; Hyperlipidemia in his father; Stroke in his father and sister.       Objective:    Vitals:   05/12/21 1418  BP: 140/70  Pulse: 76  SpO2: 98%    Physical Exam Well-developed well-nourished WM in no acute distress.  Height, Weight,192  BMI 25.4  HEENT; nontraumatic normocephalic, EOMI, PE R LA, sclera anicteric. Oropharynx; not done Neck; supple, no JVD Cardiovascular; regular rate and rhythm with S1-S2, no murmur rub or gallop Pulmonary; Clear bilaterally Abdomen; soft, he is tender in the right upper quadrant and right mid quadrant, no guarding or rebound nondistended, no palpable mass or hepatosplenomegaly, bowel sounds are active, no audible bruit Rectal; small external hemorrhoid noninflamed no stool in the rectal vault mucus heme-negative Skin; benign exam, no jaundice rash or appreciable lesions Extremities; no clubbing cyanosis or edema skin warm and dry Neuro/Psych; alert and oriented x4, grossly nonfocal mood and affect appropriate        Assessment & Plan:   #37 61 year old white male with 6-day history of upper abdominal pain usually occurring 1 to 2 hours after eating and more prevalent in the evenings, described as a pressure type of discomfort.  This is been associated with an increase in bowel movements from 2 to 3/day up to 4-8 times per day he saw small amount of blood on 1 occasion but has not been noticing melena or overt hematochezia, no fever or chills Patient had an episode of severe ischemic colitis in February 2021/right sided. He also has history of prior sigmoid resection for diverticulitis.  He is tender in the right upper quadrant and right mid quadrant today, no stool in the rectal vault, mucus heme-negative  Etiology of his symptoms is not entirely clear, need to rule out a recurrent episode of ischemic colitis, rule out possible diverticulitis , versus other intra-abdominal inflammatory process  #2 new recurrent right inguinal hernia #3 chronic anticoagulation-on Eliquis 4.  Prior history of CVAs 5.   Hypertension 6.  Adult onset diabetes mellitus 7.  PFO 8.  History of atrial fibrillation 9.  Sleep apnea  Plan; CBC with differential, c-Met, CRP Patient will be scheduled for CT scan of the abdomen pelvis with oral but no IV contrast (episode of ischemic colitis occurred within 6 hours of IV contrast 2021)  Bland diet  Further recommendations pending results of above.  Patient has been advised to call should he have any worsening  symptoms in the interim until he has CT.  Sipriano Fendley Genia Harold PA-C 05/13/2021   Cc: Kathyrn Drown, MD

## 2021-05-15 ENCOUNTER — Ambulatory Visit (INDEPENDENT_AMBULATORY_CARE_PROVIDER_SITE_OTHER)
Admission: RE | Admit: 2021-05-15 | Discharge: 2021-05-15 | Disposition: A | Discharge status: Home / Self Care | Payer: No Typology Code available for payment source | Source: Ambulatory Visit | Attending: Physician Assistant | Admitting: Physician Assistant

## 2021-05-15 ENCOUNTER — Other Ambulatory Visit: Payer: Self-pay

## 2021-05-15 DIAGNOSIS — R109 Unspecified abdominal pain: Secondary | ICD-10-CM | POA: Diagnosis not present

## 2021-05-15 DIAGNOSIS — Z8719 Personal history of other diseases of the digestive system: Secondary | ICD-10-CM

## 2021-05-15 DIAGNOSIS — K625 Hemorrhage of anus and rectum: Secondary | ICD-10-CM

## 2021-05-18 ENCOUNTER — Other Ambulatory Visit (HOSPITAL_COMMUNITY): Payer: Self-pay

## 2021-05-18 ENCOUNTER — Other Ambulatory Visit: Payer: Self-pay | Admitting: Family Medicine

## 2021-05-19 ENCOUNTER — Other Ambulatory Visit (HOSPITAL_COMMUNITY): Payer: Self-pay

## 2021-05-19 MED ORDER — ALPRAZOLAM 0.25 MG PO TABS
ORAL_TABLET | Freq: Three times a day (TID) | ORAL | 3 refills | Status: DC | PRN
  Filled 2021-05-19 – 2021-05-20 (×2): qty 90, 30d supply, fill #0
  Filled 2021-06-27: qty 90, 30d supply, fill #1
  Filled 2021-07-27: qty 90, 30d supply, fill #2

## 2021-05-19 MED ORDER — FLUOXETINE HCL 20 MG PO CAPS
20.0000 mg | ORAL_CAPSULE | Freq: Every day | ORAL | 1 refills | Status: DC
  Filled 2021-05-19: qty 90, 90d supply, fill #0
  Filled 2021-09-18: qty 90, 90d supply, fill #1

## 2021-05-20 ENCOUNTER — Telehealth: Payer: Self-pay

## 2021-05-20 ENCOUNTER — Other Ambulatory Visit (HOSPITAL_COMMUNITY): Payer: Self-pay

## 2021-05-20 NOTE — Telephone Encounter (Signed)
This is Travis [name], Tyeler's wife.  He requested that I respond to you.  He continues to have urgency to defecate.  He also has back pain.   Could any of the medications that he is currently prescribed cause the urgency?  Metformin?   Also does he need to be concerned about the hernias?     Finally, when will he need another colonoscopy?   So sorry for all of the questions but with his past issues we just want to make sure that we follow-up appropriately.     Thanks!

## 2021-05-26 ENCOUNTER — Other Ambulatory Visit (HOSPITAL_COMMUNITY): Payer: Self-pay

## 2021-06-10 LAB — HM DIABETES EYE EXAM

## 2021-06-20 ENCOUNTER — Other Ambulatory Visit (HOSPITAL_COMMUNITY): Payer: Self-pay

## 2021-06-23 ENCOUNTER — Ambulatory Visit: Payer: No Typology Code available for payment source | Admitting: Internal Medicine

## 2021-06-23 ENCOUNTER — Encounter: Payer: Self-pay | Admitting: Internal Medicine

## 2021-06-23 VITALS — BP 122/78 | HR 57 | Ht 73.0 in | Wt 194.4 lb

## 2021-06-23 DIAGNOSIS — R194 Change in bowel habit: Secondary | ICD-10-CM

## 2021-06-23 DIAGNOSIS — R109 Unspecified abdominal pain: Secondary | ICD-10-CM

## 2021-06-23 DIAGNOSIS — K4090 Unilateral inguinal hernia, without obstruction or gangrene, not specified as recurrent: Secondary | ICD-10-CM

## 2021-06-23 DIAGNOSIS — K4030 Unilateral inguinal hernia, with obstruction, without gangrene, not specified as recurrent: Secondary | ICD-10-CM

## 2021-06-23 NOTE — Progress Notes (Signed)
HISTORY OF PRESENT ILLNESS:  Travis [name] is a 61 y.o. male with past medical history as listed below who was seen in this office 6 weeks ago by the GI physician assistant complaining of abdominal discomfort and change in bowel habits.  See that dictation.  Patient underwent blood work including CBC, comprehensive metabolic panel, and C-reactive protein.  The blood work was unremarkable.  Normal hemoglobin of 13.7.  Normal sedimentation rate at 7.  He subsequently underwent noncontrast CT of the abdomen and pelvis.  No acute findings.  He was noted to have bilateral fat-containing inguinal hernias.  This follow-up appointment scheduled.  Patient does have a history of ischemic colitis in February 2011.  He underwent colonoscopy at that time.  He also has a history of sigmoid colon resection for diverticular disease.  He tells me that he has had prior inguinal hernia repair.  He describes to me today discomfort in his right groin.  His bowel habits have changed from 2-4 bowel movements per day to 3-5 bowel movements per day.  Formed.  No new complaints.  He is on multiple medicines including metformin.  No additional complaints  REVIEW OF SYSTEMS:  All non-GI ROS negative unless otherwise stated in the HPI except for arthritis  Past Medical History:  Diagnosis Date   A-fib ([place]) 02/2016   found on loop recorder   Adhesive capsulitis of left shoulder 08/02/2014   Anxiety    Arthritis    Blood transfusion without reported diagnosis    Chronic headaches    Complication of anesthesia    bleed after last shoulder-had to stay overnight   Depression    Diabetes mellitus    Diabetic peripheral neuropathy ([place]) 03/28/2013   Diverticulosis    Dizziness    Hernia, inguinal    Hyperlipidemia    Hypertension    Impingement syndrome of left shoulder 08/02/2014   Ischemic colitis ([place])    Multi-infarct state 10/14/2014   Neuropathy of lower extremity    Night sweats    every once in a while   Sleep  apnea    no CPAP   Stroke ([place]) 02/2015   Syncope and collapse     Past Surgical History:  Procedure Laterality Date   BIOPSY  06/14/2019   Procedure: BIOPSY;  Surgeon: [name], MD;  Location: [facility];  Service: Gastroenterology;;   [place]  2003   1/3 removed for diverticulitis   COLONOSCOPY     COLONOSCOPY WITH PROPOFOL N/A 06/14/2019   Procedure: COLONOSCOPY WITH PROPOFOL;  Surgeon: [name], MD;  Location: [facility];  Service: Gastroenterology;  Laterality: N/A;   EP IMPLANTABLE DEVICE N/A 05/01/2015   Procedure: Loop Recorder Insertion;  Surgeon: [name], MD;  Location: [facility];  Service: Cardiovascular;  Laterality: N/A;   INGUINAL HERNIA REPAIR Bilateral    KNEE ARTHROSCOPY Right    SHOULDER ARTHROSCOPY Right    1   SHOULDER ARTHROSCOPY Left 08/02/2014   Procedure: LEFT SHOULDER SCOPE DEBRIDEMENT/ACROMIOPLASTY;  Surgeon: [name], MD;  Location: [facility];  Service: Orthopedics;  Laterality: Left;  ANESTHESIA: GENERAL, PRE/POST OP SCALENE   TEE WITHOUT CARDIOVERSION N/A 03/03/2015   Procedure: TRANSESOPHAGEAL ECHOCARDIOGRAM (TEE);  Surgeon: [name], MD;  [facility];  Service: Cardiology;  Laterality: N/A;   UMBILICAL HERNIA REPAIR     with other hernia repair with mesh   WRIST SURGERY Right  fusion    Social History Travis [name]  reports that he quit smoking about 43 years ago. His smoking use included cigarettes. He started smoking about 51 years ago. He has a 8.00 pack-year smoking history. He quit smokeless tobacco use about 43 years ago.  His smokeless tobacco use included chew. He reports that he does not drink alcohol and does not use drugs.  family history includes ALS in his brother; Dementia in his mother; Diabetes in his maternal grandfather; Heart attack (age of onset: 24) in his sister; Hyperlipidemia in his father; Stroke in his father  and sister.  Allergies  Allergen Reactions   Lisinopril Cough    Patient/spouse is not aware/familiar with why this is listed as an allergy Not a true allergy but on this list to avoid ACE inhibitors-SA Luking MD       PHYSICAL EXAMINATION: Vital signs: BP 122/78    Pulse (!) 57    Ht 6\' 1"  (1.854 m)    Wt 194 lb 6 oz (88.2 kg)    BMI 25.64 kg/m   Constitutional: generally well-appearing, no acute distress Psychiatric: alert and oriented x3, cooperative Eyes: extraocular movements intact, anicteric, conjunctiva pink Mouth: Mask Neck: supple no lymphadenopathy Cardiovascular: heart regular rate and rhythm, no murmur Lungs: clear to auscultation bilaterally Abdomen: soft, nontender, nondistended, no obvious ascites, no peritoneal signs, normal bowel sounds, no organomegaly Inguinal region: Palpable slightly tender right inguinal hernia Rectal: Omitted Extremities: no clubbing, cyanosis, or lower extremity edema bilaterally Skin: no lesions on visible extremities Neuro: No focal deficits.  Cranial nerves intact  ASSESSMENT:  1.  Mild nonspecific change in bowel habits as described.  No alarm features 2.  Right hernia.  Slightly tender.  Nonobstructive 3.  Status post sigmoid resection for diverticular disease 4.  History of ischemic colitis 5.  General medical problems including history of atrial fibrillation's with prior CVAs on chronic Eliquis.   PLAN:  1.  Recommend Citrucel 2 tablespoons daily to see if this improves his bowel consistency 2.  His PCP can refer him to a general surgeon if his inguinal hernia is bothering him to the point that he feels like he needs redo surgery 3.  Resume general medical care with PCP.  GI follow-up as needed Total time of 30 minutes was spent preparing to see the patient, reviewing laboratories, reviewing x-rays, obtaining interval history, performing comprehensive physical examination, counseling the patient regarding the above listed  issues, recommending medical therapy, discussing surgical referral, and documenting clinical information in the health record

## 2021-06-23 NOTE — Patient Instructions (Signed)
If you are age 63 or older, your body mass index should be between 23-30. Your Body mass index is 25.64 kg/m. If this is out of the aforementioned range listed, please consider follow up with your Primary Care Provider.  If you are age 69 or younger, your body mass index should be between 19-25. Your Body mass index is 25.64 kg/m. If this is out of the aformentioned range listed, please consider follow up with your Primary Care Provider.   ________________________________________________________  The [facility] providers would like to encourage you to use [facility] to communicate with providers for non-urgent requests or questions.  Due to long hold times on the telephone, sending your provider a message by [facility] may be a faster and more efficient way to get a response.  Please allow 48 business hours for a response.  Please remember that this is for non-urgent requests.  _______________________________________________________  Take 2 tablespoons of Citrucel daily in water or juice.  Please follow up as needed

## 2021-06-27 ENCOUNTER — Other Ambulatory Visit: Payer: Self-pay | Admitting: Family Medicine

## 2021-06-27 ENCOUNTER — Other Ambulatory Visit (HOSPITAL_COMMUNITY): Payer: Self-pay

## 2021-06-29 ENCOUNTER — Other Ambulatory Visit (HOSPITAL_COMMUNITY): Payer: Self-pay

## 2021-06-29 MED ORDER — FLUTICASONE PROPIONATE 50 MCG/ACT NA SUSP
NASAL | 3 refills | Status: DC
  Filled 2021-06-29: qty 16, 60d supply, fill #0
  Filled 2021-07-09: qty 16, 30d supply, fill #0
  Filled 2021-09-18: qty 16, 30d supply, fill #1
  Filled 2021-11-13: qty 16, 30d supply, fill #2
  Filled 2022-01-06: qty 16, 30d supply, fill #3

## 2021-07-04 ENCOUNTER — Other Ambulatory Visit (HOSPITAL_COMMUNITY): Payer: Self-pay

## 2021-07-04 MED FILL — Loratadine Tab 10 MG: ORAL | 90 days supply | Qty: 90 | Fill #0 | Status: CN

## 2021-07-08 ENCOUNTER — Other Ambulatory Visit (HOSPITAL_COMMUNITY): Payer: Self-pay

## 2021-07-09 ENCOUNTER — Other Ambulatory Visit (HOSPITAL_COMMUNITY): Payer: Self-pay

## 2021-07-14 ENCOUNTER — Ambulatory Visit: Payer: No Typology Code available for payment source | Admitting: Orthopedic Surgery

## 2021-07-14 ENCOUNTER — Other Ambulatory Visit: Payer: Self-pay

## 2021-07-14 ENCOUNTER — Encounter: Payer: Self-pay | Admitting: Orthopedic Surgery

## 2021-07-14 DIAGNOSIS — M67442 Ganglion, left hand: Secondary | ICD-10-CM | POA: Diagnosis not present

## 2021-07-14 NOTE — Progress Notes (Signed)
New Patient Visit ? ?Assessment: ?Travis [name] is a 61 y.o. ambidextrous male with the following: ?1. Ganglion cyst of joint of finger of left hand ? ?Plan: ?[name] has a cyst at the base of his left thumb.  It is not painful, but he does not like the growth.  I offered him an aspiration of the cyst in clinic today, and he elected to proceed.  Continue to monitor the cyst, and the aspiration site.  Follow-up as needed. ? ?Procedure note aspiration- Left Thumb  ? ?Verbal consent was obtained to aspirate the cyst at the base of the Left Thumb ?Timeout was completed to confirm the site of aspiration.  The skin was prepped with alcohol and ethyl chloride was sprayed at the injection site.  ?An 18-gauge needle was used to aspirate approximately 0.5 cc of thick gelatinous cyst material.   ?There were no complications. Patient tolerated the procedure well. A sterile bandage was applied ? ? ? ?Follow-up: ?No follow-ups on file. ? ?Subjective: ? ?Chief Complaint  ?Patient presents with  ? Hand Pain  ?  Left hand, knot at base of thumb/wrist. 3 week onset, is bothersome, not painful per se.   ? ? ?History of Present Illness: ?Travis [name] is a 61 y.o. male who presents for evaluation of a growth on the base of his left thumb.  He first noticed that approximately 3 weeks ago.  No prior issues with his thumb.  He notes occasional pain at the base of the thumb.  No overlying skin changes.  His health is remained stable.  It is not painful to him.  It does bother him, and he wants it gone. ? ? ?Review of Systems: ?No fevers or chills ?No numbness or tingling ?No chest pain ?No shortness of breath ?No bowel or bladder dysfunction ?No GI distress ?No headaches ? ? ?Medical History: ? ?Past Medical History:  ?Diagnosis Date  ? A-fib ([place]) 02/2016  ? found on loop recorder  ? Adhesive capsulitis of left shoulder 08/02/2014  ? Anxiety   ? Arthritis   ? Blood transfusion without reported diagnosis   ? Chronic headaches   ?  Complication of anesthesia   ? bleed after last shoulder-had to stay overnight  ? Depression   ? Diabetes mellitus   ? Diabetic peripheral neuropathy ([place]) 03/28/2013  ? Diverticulosis   ? Dizziness   ? Hernia, inguinal   ? Hyperlipidemia   ? Hypertension   ? Impingement syndrome of left shoulder 08/02/2014  ? Ischemic colitis ([place])   ? Multi-infarct state 10/14/2014  ? Neuropathy of lower extremity   ? Night sweats   ? every once in a while  ? Sleep apnea   ? no CPAP  ? Stroke [facility]) 02/2015  ? Syncope and collapse   ? ? ?Past Surgical History:  ?Procedure Laterality Date  ? BIOPSY  06/14/2019  ? Procedure: BIOPSY;  Surgeon: [name], MD;  Location: [facility];  Service: Gastroenterology;;  ? [place]    ? COLON SURGERY  2003  ? 1/3 removed for diverticulitis  ? COLONOSCOPY    ? COLONOSCOPY WITH PROPOFOL N/A 06/14/2019  ? Procedure: COLONOSCOPY WITH PROPOFOL;  Surgeon: [name], MD;  [facility];  Service: Gastroenterology;  Laterality: N/A;  ? EP IMPLANTABLE DEVICE N/A 05/01/2015  ? Procedure: Loop Recorder Insertion;  Surgeon: [name], MD;  Location: [place] [facility];  Service: Cardiovascular;  Laterality: N/A;  ?  INGUINAL HERNIA REPAIR Bilateral   ? KNEE ARTHROSCOPY Right   ? SHOULDER ARTHROSCOPY Right   ? 1  ? SHOULDER ARTHROSCOPY Left 08/02/2014  ? Procedure: LEFT SHOULDER SCOPE DEBRIDEMENT/ACROMIOPLASTY;  Surgeon: [name], MD;  Location: [place];  Service: Orthopedics;  Laterality: Left;  ANESTHESIA: GENERAL, PRE/POST OP SCALENE  ? TEE WITHOUT CARDIOVERSION N/A 03/03/2015  ? Procedure: TRANSESOPHAGEAL ECHOCARDIOGRAM (TEE);  Surgeon: [name], MD;  [facility];  Service: Cardiology;  Laterality: N/A;  ? UMBILICAL HERNIA REPAIR    ? with other hernia repair with mesh  ? WRIST SURGERY Right   ? fusion  ? ? ?Family History  ?Problem Relation Age of Onset  ? Stroke Father   ? Hyperlipidemia Father   ? Heart attack Sister  84  ? Stroke Sister   ? Dementia Mother   ? ALS Brother   ?     age 30  ? Diabetes Maternal Grandfather   ? Colon cancer Neg Hx   ? Esophageal cancer Neg Hx   ? Stomach cancer Neg Hx   ? Rectal cancer Neg Hx   ? ?Social History  ? ?Tobacco Use  ? Smoking status: Former  ?  Packs/day: 1.00  ?  Years: 8.00  ?  Pack years: 8.00  ?  Types: Cigarettes  ?  Start date: 06/07/1970  ?  Quit date: 05/03/1978  ?  Years since quitting: 43.2  ? Smokeless tobacco: Former  ?  Types: Chew  ?  Quit date: 05/03/1978  ?Vaping Use  ? Vaping Use: Never used  ?Substance Use Topics  ? Alcohol use: No  ?  Alcohol/week: 1.0 standard drink  ?  Types: 1 Cans of beer per week  ?  Comment: h/o heavy use in the past  ? Drug use: No  ? ? ?Allergies  ?Allergen Reactions  ? Lisinopril Cough  ?  Patient/spouse is not aware/familiar with why this is listed as an allergy ?Not a true allergy but on this list to avoid ACE Marcha Dutton MD  ? ? ?Current Meds  ?Medication Sig  ? Accu-Chek FastClix Lancets MISC USE TO CHECK BLOOD SUGAR 2 TIMES A DAY  ? ALPRAZolam (XANAX) 0.25 MG tablet TAKE 1 TABLET BY MOUTH 3 TIMES DAILY AS NEEDED  ? apixaban (ELIQUIS) 5 MG TABS tablet TAKE 1 TABLET BY MOUTH 2 TIMES DAILY.  ? atorvastatin (LIPITOR) 20 MG tablet Take 1 tablet (20 mg total) by mouth daily.  ? Blood Glucose Monitoring Suppl (BLOOD GLUCOSE MONITOR SYSTEM) w/Device KIT Test glucose 2 time daily.  Meter/strips/lancets. Please dispense per patient/insurance preferrence  ? Blood Pressure Monitoring (OMRON 3 SERIES BP MONITOR) DEVI USE AS DIRECTED.  ? Cholecalciferol 50 MCG (2000 UT) CAPS Take 1 capsule (2,000 Units total) by mouth daily with breakfast.  ? FLUoxetine (PROZAC) 20 MG capsule Take 1 capsule by mouth daily.  ? fluticasone (FLONASE) 50 MCG/ACT nasal spray Place 1 spray into both nostrils daily.  ? folic acid (FOLVITE) 1 MG tablet Take 1 tablet (1 mg total) by mouth daily.  ? gabapentin (NEURONTIN) 300 MG capsule TAKE 1 CAPSULE BY MOUTH IN THE MORNING,  1 CAPSULE BY MOUTH IN THE AFTERNOON THEN 2 CAPSULES NIGHTLY  ? hyoscyamine (LEVSIN SL) 0.125 MG SL tablet Place 1 tablet (0.125 mg total) under the tongue daily before breakfast.  ? insulin degludec (TRESIBA) 100 UNIT/ML FlexTouch Pen Inject 35 Units into the skin at bedtime.  ? Insulin Pen Needle (UNIFINE PENTIPS)  31G X 6 MM MISC USE DAILY AS DIRECTED  ? loratadine (CLARITIN) 10 MG tablet Take 1 tablet (10 mg total) by mouth daily.  ? metFORMIN (GLUCOPHAGE) 500 MG tablet TAKE 1 TABLET BY MOUTH TWO TIMES DAILY WITH A MEAL  ? Multiple Vitamin (MULTIVITAMIN WITH MINERALS) TABS tablet Take 1 tablet daily by mouth.  ? valACYclovir (VALTREX) 1000 MG tablet Take 1 tablet (1,000 mg total) by mouth daily.  ? ? ?Objective: ?There were no vitals taken for this visit. ? ?Physical Exam: ? ?General: Alert and oriented. and No acute distress. ?Gait: Normal gait. ? ?Evaluation of the left hand demonstrates a small swelling at the base of the left thumb.  No overlying skin changes.  There is no redness.  The skin is mobile overlying the cyst.  It is not tender to palpation.  No pain with [facility] grind testing.  He has full range of motion of the left hand.  5/5 grip strength.  No numbness. ? ?IMAGING: ?No new imaging obtained today ? ? ?New Medications:  ?No orders of the defined types were placed in this encounter. ? ? ? ? ?Mordecai Rasmussen, MD ? ?07/14/2021 ?2:03 PM ? ? ?

## 2021-07-16 ENCOUNTER — Encounter (HOSPITAL_COMMUNITY): Payer: Self-pay | Admitting: Nurse Practitioner

## 2021-07-16 ENCOUNTER — Ambulatory Visit (HOSPITAL_COMMUNITY)
Admission: RE | Admit: 2021-07-16 | Discharge: 2021-07-16 | Disposition: A | Discharge status: Home / Self Care | Payer: No Typology Code available for payment source | Source: Ambulatory Visit | Attending: Nurse Practitioner | Admitting: Nurse Practitioner

## 2021-07-16 ENCOUNTER — Other Ambulatory Visit: Payer: Self-pay

## 2021-07-16 VITALS — BP 138/86 | HR 73 | Ht 73.0 in | Wt 195.6 lb

## 2021-07-16 DIAGNOSIS — Z8249 Family history of ischemic heart disease and other diseases of the circulatory system: Secondary | ICD-10-CM | POA: Insufficient documentation

## 2021-07-16 DIAGNOSIS — Z8673 Personal history of transient ischemic attack (TIA), and cerebral infarction without residual deficits: Secondary | ICD-10-CM | POA: Insufficient documentation

## 2021-07-16 DIAGNOSIS — I480 Paroxysmal atrial fibrillation: Secondary | ICD-10-CM | POA: Diagnosis not present

## 2021-07-16 DIAGNOSIS — Z823 Family history of stroke: Secondary | ICD-10-CM | POA: Diagnosis not present

## 2021-07-16 DIAGNOSIS — Z79899 Other long term (current) drug therapy: Secondary | ICD-10-CM | POA: Insufficient documentation

## 2021-07-16 DIAGNOSIS — Z7901 Long term (current) use of anticoagulants: Secondary | ICD-10-CM | POA: Insufficient documentation

## 2021-07-16 DIAGNOSIS — D6869 Other thrombophilia: Secondary | ICD-10-CM | POA: Diagnosis not present

## 2021-07-16 DIAGNOSIS — Z95818 Presence of other cardiac implants and grafts: Secondary | ICD-10-CM | POA: Diagnosis not present

## 2021-07-16 DIAGNOSIS — Z8719 Personal history of other diseases of the digestive system: Secondary | ICD-10-CM | POA: Diagnosis not present

## 2021-07-16 NOTE — Progress Notes (Signed)
? ?Primary Care Physician: Kathyrn Drown, MD ?Referring Physician: Dr. Caryl Comes  ? ? ?Travis [name] is a 61 y.o. male with a h/o CVA, s/p linq with transient afib noted that was seen by me in 2017 for start of eliquis. I have not seen him since then. He is here today to get refills on his eliquis. He has SR on his EKG and has not noted any irregular HR's. Review of his Paceart  device checks  have shown absence of afib. He is doing well. No bleeding issues. Last cmet  In December showed normal creatinine at 1.03. ? ?F/u in afib clinic, 07/12/19. He is here for f/u afib  s/p Linq insertion after stroke 2017. As far as he is aware, he has had no further afib. He was in the hospital in February with a GI bleed. He was off anticoagulation for a few days  but has resumed. ASA was stopped. No reason for the bleed was identified. No further bleeding. CHA2DS2VASc score is at least 4.  His Linq battery has since expired but he prefers to leave in place for now. ? ?F/u in afib clinic, 2020/07/23. Linq battery is now dead. We discussed if he wanted to have removed, for now, he will continue to let it be.  He reports that he is not noted any afib. Had cmet 3/11 with PCP, but not a CBC, will draw today with ongoing eliquis use, CHA2DS2VASc of at least 4. No bleed issues.  ? ?F/u 07/23/21. He reports no awareness of afib. He feels good. No complaints. Ekg shows SR.  CHA2DS2VASc  of at least 4. Continues on eliquis 5 mg bid  ? ? ?Today, he denies symptoms of palpitations, chest pain, shortness of breath, orthopnea, PND, lower extremity edema, dizziness, presyncope, syncope, or neurologic sequela. The patient is tolerating medications without difficulties and is otherwise without complaint today.  ? ?Past Medical History:  ?Diagnosis Date  ? A-fib ([place]) 02/2016  ? found on loop recorder  ? Adhesive capsulitis of left shoulder 08/02/2014  ? Anxiety   ? Arthritis   ? Blood transfusion without reported diagnosis   ? Chronic headaches   ?  Complication of anesthesia   ? bleed after last shoulder-had to stay overnight  ? Depression   ? Diabetes mellitus   ? Diabetic peripheral neuropathy ([place]) 03/28/2013  ? Diverticulosis   ? Dizziness   ? Hernia, inguinal   ? Hyperlipidemia   ? Hypertension   ? Impingement syndrome of left shoulder 08/02/2014  ? Ischemic colitis ([place])   ? Multi-infarct state 10/14/2014  ? Neuropathy of lower extremity   ? Night sweats   ? every once in a while  ? Sleep apnea   ? no CPAP  ? Stroke [facility]) 02/2015  ? Syncope and collapse   ? ?Past Surgical History:  ?Procedure Laterality Date  ? BIOPSY  06/14/2019  ? Procedure: BIOPSY;  Surgeon: [name], MD;  Location: [facility];  Service: Gastroenterology;;  ? [place]    ? COLON SURGERY  2003  ? 1/3 removed for diverticulitis  ? COLONOSCOPY    ? COLONOSCOPY WITH PROPOFOL N/A 06/14/2019  ? Procedure: COLONOSCOPY WITH PROPOFOL;  Surgeon: [name], MD;  [facility];  Service: Gastroenterology;  Laterality: N/A;  ? EP IMPLANTABLE DEVICE N/A 05/01/2015  ? Procedure: Loop Recorder Insertion;  Surgeon: [name], MD;  Location: [place] [facility];  Service: Cardiovascular;  Laterality: N/A;  ?  INGUINAL HERNIA REPAIR Bilateral   ? KNEE ARTHROSCOPY Right   ? SHOULDER ARTHROSCOPY Right   ? 1  ? SHOULDER ARTHROSCOPY Left 08/02/2014  ? Procedure: LEFT SHOULDER SCOPE DEBRIDEMENT/ACROMIOPLASTY;  Surgeon: [name], MD;  Location: [place];  Service: Orthopedics;  Laterality: Left;  ANESTHESIA: GENERAL, PRE/POST OP SCALENE  ? TEE WITHOUT CARDIOVERSION N/A 03/03/2015  ? Procedure: TRANSESOPHAGEAL ECHOCARDIOGRAM (TEE);  Surgeon: [name], MD;  [facility];  Service: Cardiology;  Laterality: N/A;  ? UMBILICAL HERNIA REPAIR    ? with other hernia repair with mesh  ? WRIST SURGERY Right   ? fusion  ? ? ?Current Outpatient Medications  ?Medication Sig Dispense Refill  ? Accu-Chek FastClix Lancets MISC USE TO CHECK  BLOOD SUGAR 2 TIMES A DAY 204 each 2  ? ALPRAZolam (XANAX) 0.25 MG tablet TAKE 1 TABLET BY MOUTH 3 TIMES DAILY AS NEEDED 90 tablet 3  ? apixaban (ELIQUIS) 5 MG TABS tablet TAKE 1 TABLET BY MOUTH 2 TIMES DAILY. 180 tablet 3  ? atorvastatin (LIPITOR) 20 MG tablet Take 1 tablet (20 mg total) by mouth daily. 90 tablet 0  ? Blood Glucose Monitoring Suppl (BLOOD GLUCOSE MONITOR SYSTEM) w/Device KIT Test glucose 2 time daily.  Meter/strips/lancets. Please dispense per patient/insurance preferrence 1 each 5  ? Blood Pressure Monitoring (OMRON 3 SERIES BP MONITOR) DEVI USE AS DIRECTED. 1 each 0  ? Cholecalciferol 50 MCG (2000 UT) CAPS Take 1 capsule (2,000 Units total) by mouth daily with breakfast. 30 each 6  ? FLUoxetine (PROZAC) 20 MG capsule Take 1 capsule by mouth daily. 90 capsule 1  ? folic acid (FOLVITE) 1 MG tablet Take 1 tablet (1 mg total) by mouth daily. 90 tablet 0  ? gabapentin (NEURONTIN) 300 MG capsule TAKE 1 CAPSULE BY MOUTH IN THE MORNING, 1 CAPSULE BY MOUTH IN THE AFTERNOON THEN 2 CAPSULES NIGHTLY 120 capsule 6  ? hyoscyamine (LEVSIN SL) 0.125 MG SL tablet Place 1 tablet (0.125 mg total) under the tongue daily before breakfast. 30 tablet 6  ? insulin degludec (TRESIBA) 100 UNIT/ML FlexTouch Pen Inject 35 Units into the skin at bedtime. 30 mL 3  ? Insulin Pen Needle (UNIFINE PENTIPS) 31G X 6 MM MISC USE DAILY AS DIRECTED 100 each 1  ? loratadine (CLARITIN) 10 MG tablet Take 1 tablet (10 mg total) by mouth daily. 90 tablet 2  ? metFORMIN (GLUCOPHAGE) 500 MG tablet TAKE 1 TABLET BY MOUTH TWO TIMES DAILY WITH A MEAL 180 tablet 1  ? Multiple Vitamin (MULTIVITAMIN WITH MINERALS) TABS tablet Take 1 tablet daily by mouth.    ? valACYclovir (VALTREX) 1000 MG tablet Take 1 tablet (1,000 mg total) by mouth daily. 90 tablet 0  ? fluticasone (FLONASE) 50 MCG/ACT nasal spray Place 1 spray into both nostrils daily. (Patient not taking: Reported on 07/16/2021) 16 g 3  ? ?No current facility-administered medications for  this encounter.  ? ? ?Allergies  ?Allergen Reactions  ? Lisinopril Cough  ?  Patient/spouse is not aware/familiar with why this is listed as an allergy ?Not a true allergy but on this list to avoid ACE Marcha Dutton MD  ? ? ?Social History  ? ?Socioeconomic History  ? Marital status: Married  ?  Spouse name: Not on file  ? Number of children: 2  ? Years of education: College  ? Highest education level: Not on file  ?Occupational History  ? Occupation: disabled  ?Tobacco Use  ? Smoking status: Former  ?  Packs/day: 1.00  ?  Years: 8.00  ?  Pack years: 8.00  ?  Types: Cigarettes  ?  Start date: 06/07/1970  ?  Quit date: 05/03/1978  ?  Years since quitting: 43.2  ? Smokeless tobacco: Former  ?  Types: Chew  ?  Quit date: 05/03/1978  ?Vaping Use  ? Vaping Use: Never used  ?Substance and Sexual Activity  ? Alcohol use: No  ?  Alcohol/week: 1.0 standard drink  ?  Types: 1 Cans of beer per week  ?  Comment: h/o heavy use in the past  ? Drug use: No  ? Sexual activity: Yes  ?Other Topics Concern  ? Not on file  ?Social History Narrative  ? Drinks some coffee, Drink diet sodas and tea  ? ?Social Determinants of Health  ? ?Financial Resource Strain: Not on file  ?Food Insecurity: Not on file  ?Transportation Needs: Not on file  ?Physical Activity: Not on file  ?Stress: Not on file  ?Social Connections: Not on file  ?Intimate Partner Violence: Not on file  ? ? ?Family History  ?Problem Relation Age of Onset  ? Stroke Father   ? Hyperlipidemia Father   ? Heart attack Sister 33  ? Stroke Sister   ? Dementia Mother   ? ALS Brother   ?     age 72  ? Diabetes Maternal Grandfather   ? Colon cancer Neg Hx   ? Esophageal cancer Neg Hx   ? Stomach cancer Neg Hx   ? Rectal cancer Neg Hx   ? ? ?ROS- All systems are reviewed and negative except as per the HPI above ? ?Physical Exam: ?Vitals:  ? 07/16/21 1447  ?BP: 138/86  ?Pulse: 73  ?Weight: 88.7 kg  ?Height: 6' 1" (1.854 m)  ? ?Wt Readings from Last 3 Encounters:  ?07/16/21 88.7 kg   ?06/23/21 88.2 kg  ?05/12/21 87.5 kg  ? ? ?Labs: ?Lab Results  ?Component Value Date  ? NA 141 05/12/2021  ? K 4.1 05/12/2021  ? CL 106 05/12/2021  ? CO2 28 05/12/2021  ? GLUCOSE 141 (H) 05/12/2021  ? BUN 17 0

## 2021-07-18 ENCOUNTER — Other Ambulatory Visit (HOSPITAL_COMMUNITY): Payer: Self-pay

## 2021-07-20 ENCOUNTER — Other Ambulatory Visit (HOSPITAL_COMMUNITY): Payer: Self-pay

## 2021-07-24 ENCOUNTER — Encounter: Payer: Self-pay | Admitting: Family Medicine

## 2021-07-24 ENCOUNTER — Other Ambulatory Visit: Payer: Self-pay | Admitting: Nurse Practitioner

## 2021-07-24 ENCOUNTER — Other Ambulatory Visit: Payer: Self-pay | Admitting: Adult Health

## 2021-07-24 ENCOUNTER — Other Ambulatory Visit (HOSPITAL_COMMUNITY): Payer: Self-pay

## 2021-07-25 ENCOUNTER — Other Ambulatory Visit (HOSPITAL_COMMUNITY): Payer: Self-pay

## 2021-07-25 ENCOUNTER — Other Ambulatory Visit: Payer: Self-pay | Admitting: Family Medicine

## 2021-07-25 LAB — COMPREHENSIVE METABOLIC PANEL
ALT: 7 IU/L (ref 0–44)
AST: 15 IU/L (ref 0–40)
Albumin/Globulin Ratio: 1.7 (ref 1.2–2.2)
Albumin: 4.3 g/dL (ref 3.8–4.8)
Alkaline Phosphatase: 91 IU/L (ref 44–121)
BUN/Creatinine Ratio: 12 (ref 10–24)
BUN: 12 mg/dL (ref 8–27)
Bilirubin Total: 0.4 mg/dL (ref 0.0–1.2)
CO2: 24 mmol/L (ref 20–29)
Calcium: 9.1 mg/dL (ref 8.6–10.2)
Chloride: 104 mmol/L (ref 96–106)
Creatinine, Ser: 0.98 mg/dL (ref 0.76–1.27)
Globulin, Total: 2.6 g/dL (ref 1.5–4.5)
Glucose: 152 mg/dL — ABNORMAL HIGH (ref 70–99)
Potassium: 3.8 mmol/L (ref 3.5–5.2)
Sodium: 142 mmol/L (ref 134–144)
Total Protein: 6.9 g/dL (ref 6.0–8.5)
eGFR: 87 mL/min/{1.73_m2} (ref >59)

## 2021-07-25 LAB — LIPID PANEL
Chol/HDL Ratio: 3.1 ratio (ref 0.0–5.0)
Cholesterol, Total: 93 mg/dL — ABNORMAL LOW (ref 100–199)
HDL: 30 mg/dL — ABNORMAL LOW (ref >39)
LDL Chol Calc (NIH): 50 mg/dL (ref 0–99)
Triglycerides: 54 mg/dL (ref 0–149)
VLDL Cholesterol Cal: 13 mg/dL (ref 5–40)

## 2021-07-25 LAB — TSH: TSH: 1.39 u[IU]/mL (ref 0.450–4.500)

## 2021-07-25 LAB — VITAMIN D 25 HYDROXY (VIT D DEFICIENCY, FRACTURES): Vit D, 25-Hydroxy: 76.4 ng/mL (ref 30.0–100.0)

## 2021-07-25 LAB — T4, FREE: Free T4: 1.25 ng/dL (ref 0.82–1.77)

## 2021-07-27 ENCOUNTER — Telehealth: Payer: Self-pay | Admitting: Adult Health

## 2021-07-27 ENCOUNTER — Other Ambulatory Visit (HOSPITAL_COMMUNITY): Payer: Self-pay

## 2021-07-27 MED ORDER — GABAPENTIN 300 MG PO CAPS
ORAL_CAPSULE | ORAL | 6 refills | Status: DC
  Filled 2021-07-27: qty 120, 30d supply, fill #0
  Filled 2021-08-20: qty 120, 30d supply, fill #1
  Filled 2021-09-19: qty 120, 30d supply, fill #2
  Filled 2021-10-21: qty 120, 30d supply, fill #3
  Filled 2021-11-13: qty 120, 30d supply, fill #4
  Filled 2021-12-11: qty 120, 30d supply, fill #5
  Filled 2022-01-06: qty 120, 30d supply, fill #6

## 2021-07-27 NOTE — Telephone Encounter (Signed)
Pt's wife, Bernadette Armijo (on [place]) need refill for gabapentin (NEURONTIN) 300 MG capsule, but Dr. Wolfgang Phoenix has a refll for that pending.  Dr. Lance Sell nurse told me that Janett Billow, NP has last filled the prescription and need to refilled by her. Would like a call from the nurse. ?

## 2021-07-28 ENCOUNTER — Encounter: Payer: Self-pay | Admitting: Nurse Practitioner

## 2021-07-28 ENCOUNTER — Ambulatory Visit: Payer: No Typology Code available for payment source | Admitting: Nurse Practitioner

## 2021-07-28 ENCOUNTER — Other Ambulatory Visit: Payer: Self-pay

## 2021-07-28 ENCOUNTER — Other Ambulatory Visit (HOSPITAL_COMMUNITY): Payer: Self-pay

## 2021-07-28 VITALS — BP 132/80 | HR 77 | Ht 73.0 in | Wt 191.6 lb

## 2021-07-28 DIAGNOSIS — E1159 Type 2 diabetes mellitus with other circulatory complications: Secondary | ICD-10-CM

## 2021-07-28 DIAGNOSIS — D3502 Benign neoplasm of left adrenal gland: Secondary | ICD-10-CM | POA: Diagnosis not present

## 2021-07-28 DIAGNOSIS — E782 Mixed hyperlipidemia: Secondary | ICD-10-CM | POA: Diagnosis not present

## 2021-07-28 DIAGNOSIS — E559 Vitamin D deficiency, unspecified: Secondary | ICD-10-CM | POA: Diagnosis not present

## 2021-07-28 LAB — POCT GLYCOSYLATED HEMOGLOBIN (HGB A1C): HbA1c, POC (controlled diabetic range): 7.7 % — AB (ref 0.0–7.0)

## 2021-07-28 NOTE — Patient Instructions (Signed)

## 2021-07-28 NOTE — Progress Notes (Signed)
? ?                                         ?     07/28/2021, 11:58 AM ? ?            ?Endocrinology follow-up note ? ? ? ?Subjective:  ? ? Patient ID: Travis [name], male    DOB: May 23, 1960.  ?Travis [name] is being seen in follow-up  for management of currently uncontrolled symptomatic diabetes requested by  Kathyrn Drown, MD. ? ? ?Past Medical History:  ?Diagnosis Date  ? A-fib ([place]) 02/2016  ? found on loop recorder  ? Adhesive capsulitis of left shoulder 08/02/2014  ? Anxiety   ? Arthritis   ? Blood transfusion without reported diagnosis   ? Chronic headaches   ? Complication of anesthesia   ? bleed after last shoulder-had to stay overnight  ? Depression   ? Diabetes mellitus   ? Diabetic peripheral neuropathy ([place]) 03/28/2013  ? Diverticulosis   ? Dizziness   ? Hernia, inguinal   ? Hyperlipidemia   ? Hypertension   ? Impingement syndrome of left shoulder 08/02/2014  ? Ischemic colitis ([place])   ? Multi-infarct state 10/14/2014  ? Neuropathy of lower extremity   ? Night sweats   ? every once in a while  ? Sleep apnea   ? no CPAP  ? Stroke [facility]) 02/2015  ? Syncope and collapse   ? ?Past Surgical History:  ?Procedure Laterality Date  ? BIOPSY  06/14/2019  ? Procedure: BIOPSY;  Surgeon: [name], MD;  Location: [facility];  Service: Gastroenterology;;  ? [place]    ? COLON SURGERY  2003  ? 1/3 removed for diverticulitis  ? COLONOSCOPY    ? COLONOSCOPY WITH PROPOFOL N/A 06/14/2019  ? Procedure: COLONOSCOPY WITH PROPOFOL;  Surgeon: [name], MD;  [facility];  Service: Gastroenterology;  Laterality: N/A;  ? EP IMPLANTABLE DEVICE N/A 05/01/2015  ? Procedure: Loop Recorder Insertion;  Surgeon: [name], MD;  Location: [place] [facility];  Service: Cardiovascular;  Laterality: N/A;  ? INGUINAL HERNIA REPAIR Bilateral   ? KNEE ARTHROSCOPY Right   ? SHOULDER ARTHROSCOPY Right   ? 1  ? SHOULDER ARTHROSCOPY Left 08/02/2014  ? Procedure: LEFT SHOULDER SCOPE  DEBRIDEMENT/ACROMIOPLASTY;  Surgeon: [name], MD;  Location: [place];  Service: Orthopedics;  Laterality: Left;  ANESTHESIA: GENERAL, PRE/POST OP SCALENE  ? TEE WITHOUT CARDIOVERSION N/A 03/03/2015  ? Procedure: TRANSESOPHAGEAL ECHOCARDIOGRAM (TEE);  Surgeon: [name], MD;  [facility];  Service: Cardiology;  Laterality: N/A;  ? UMBILICAL HERNIA REPAIR    ? with other hernia repair with mesh  ? WRIST SURGERY Right   ? fusion  ? ?Social History  ? ?Socioeconomic History  ? Marital status: Married  ?  Spouse name: Not on file  ? Number of children: 2  ? Years of education: College  ? Highest education level: Not on file  ?Occupational History  ? Occupation: disabled  ?Tobacco Use  ? Smoking status: Former  ?  Packs/day: 1.00  ?  Years: 8.00  ?  Pack years: 8.00  ?  Types: Cigarettes  ?  Start date: 06/07/1970  ?  Quit date: 05/03/1978  ?  Years since quitting: 43.2  ? Smokeless tobacco: Former  ?  Types: Chew  ?  Quit date: 05/03/1978  ?Vaping Use  ? Vaping Use: Never used  ?Substance and Sexual Activity  ? Alcohol use: No  ?  Alcohol/week: 1.0 standard drink  ?  Types: 1 Cans of beer per week  ?  Comment: h/o heavy use in the past  ? Drug use: No  ? Sexual activity: Yes  ?Other Topics Concern  ? Not on file  ?Social History Narrative  ? Drinks some coffee, Drink diet sodas and tea  ? ?Social Determinants of Health  ? ?Financial Resource Strain: Not on file  ?Food Insecurity: Not on file  ?Transportation Needs: Not on file  ?Physical Activity: Not on file  ?Stress: Not on file  ?Social Connections: Not on file  ? ?Outpatient Encounter Medications as of 07/28/2021  ?Medication Sig  ? Accu-Chek FastClix Lancets MISC USE TO CHECK BLOOD SUGAR 2 TIMES A DAY  ? ALPRAZolam (XANAX) 0.25 MG tablet TAKE 1 TABLET BY MOUTH 3 TIMES DAILY AS NEEDED  ? apixaban (ELIQUIS) 5 MG TABS tablet TAKE 1 TABLET BY MOUTH 2 TIMES DAILY.  ? atorvastatin (LIPITOR) 20 MG tablet Take 1 tablet (20 mg total) by  mouth daily.  ? Blood Glucose Monitoring Suppl (BLOOD GLUCOSE MONITOR SYSTEM) w/Device KIT Test glucose 2 time daily.  Meter/strips/lancets. Please dispense per patient/insurance preferrence  ? Blood Pressure Monitoring (OMRON 3 SERIES BP MONITOR) DEVI USE AS DIRECTED.  ? Cholecalciferol 50 MCG (2000 UT) CAPS Take 1 capsule (2,000 Units total) by mouth daily with breakfast.  ? FLUoxetine (PROZAC) 20 MG capsule Take 1 capsule by mouth daily.  ? fluticasone (FLONASE) 50 MCG/ACT nasal spray Place 1 spray into both nostrils daily.  ? folic acid (FOLVITE) 1 MG tablet Take 1 tablet (1 mg total) by mouth daily.  ? gabapentin (NEURONTIN) 300 MG capsule TAKE 1 CAPSULE BY MOUTH IN THE MORNING, 1 CAPSULE BY MOUTH IN THE AFTERNOON THEN 2 CAPSULES NIGHTLY  ? hyoscyamine (LEVSIN SL) 0.125 MG SL tablet Place 1 tablet (0.125 mg total) under the tongue daily before breakfast.  ? insulin degludec (TRESIBA) 100 UNIT/ML FlexTouch Pen Inject 35 Units into the skin at bedtime.  ? Insulin Pen Needle (UNIFINE PENTIPS) 31G X 6 MM MISC USE DAILY AS DIRECTED  ? loratadine (CLARITIN) 10 MG tablet Take 1 tablet (10 mg total) by mouth daily.  ? metFORMIN (GLUCOPHAGE) 500 MG tablet TAKE 1 TABLET BY MOUTH TWO TIMES DAILY WITH A MEAL  ? Multiple Vitamin (MULTIVITAMIN WITH MINERALS) TABS tablet Take 1 tablet daily by mouth.  ? valACYclovir (VALTREX) 1000 MG tablet Take 1 tablet (1,000 mg total) by mouth daily.  ? [DISCONTINUED] Insulin Glargine (BASAGLAR KWIKPEN) 100 UNIT/ML INJECT 50 UNITS INTO THE SKIN AT BEDTIME  ? ?No facility-administered encounter medications on file as of 07/28/2021.  ? ? ?ALLERGIES: ?Allergies  ?Allergen Reactions  ? Lisinopril Cough  ?  Patient/spouse is not aware/familiar with why this is listed as an allergy ?Not a true allergy but on this list to avoid ACE Marcha Dutton MD  ? ? ?VACCINATION STATUS: ?Immunization History  ?Administered Date(s) Administered  ? Influenza Split 03/28/2013  ? Influenza,inj,Quad PF,6+  Mos 02/18/2014, 01/23/2015, 02/06/2016, 02/23/2017, 02/24/2018, 01/31/2020, 02/18/2021  ? Influenza-Unspecified 01/02/2011, 02/09/2019  ? Moderna Sars-Covid-2 Vaccination 07/21/2019, 08/22/2019, 05/01/2020  ? Pneumococcal Polysaccharide-23 01/31/2010, 03/01/2015  ? Tdap 02/21/2003, 02/18/2014  ? Zoster Recombinat (Shingrix) 04/19/2019, 06/29/2019  ? ? ?Diabetes ?He presents for his follow-up diabetic visit. He has type 2 diabetes mellitus. Onset  time: He was diagnosed at approximate age of 62 years. His disease course has been fluctuating (He has prior history of heavy alcohol use/abuse.). Hypoglycemia symptoms include nervousness/anxiousness, sweats and tremors. Pertinent negatives for hypoglycemia include no confusion, headaches, pallor or seizures. Associated symptoms include foot paresthesias. Pertinent negatives for diabetes include no chest pain, no fatigue, no polydipsia, no polyphagia, no polyuria and no weakness. There are no hypoglycemic complications. Symptoms are stable. Diabetic complications include a CVA, nephropathy and peripheral neuropathy. Risk factors for coronary artery disease include dyslipidemia, diabetes mellitus, hypertension, family history, male sex, tobacco exposure and sedentary lifestyle. Current diabetic treatment includes insulin injections and oral agent (monotherapy). He is compliant with treatment most of the time. His weight is stable. He is following a generally unhealthy diet. When asked about meal planning, he reported none. He has had a previous visit with a dietitian. He participates in exercise intermittently. His home blood glucose trend is fluctuating minimally. His overall blood glucose range is 140-180 mg/dl. (He presents today with his meter, no logs, showing fluctuating glycemic profile.  His POCT A1c today is 7.7%, increasing from last visit of 6.5%.  He says he did overindulge in foods during the holidays and also has had an erratic eating pattern.  He does note  some afternoon low readings as a result of skipping a meal.  Analysis of his meter shows 7-day average of 181, 14-day average of 153, and 30-day average of 132.) An ACE inhibitor/angiotensin II receptor bl

## 2021-08-06 ENCOUNTER — Other Ambulatory Visit: Payer: Self-pay | Admitting: Family Medicine

## 2021-08-06 ENCOUNTER — Other Ambulatory Visit: Payer: Self-pay | Admitting: Nurse Practitioner

## 2021-08-06 ENCOUNTER — Other Ambulatory Visit: Payer: Self-pay | Admitting: "Endocrinology

## 2021-08-06 ENCOUNTER — Other Ambulatory Visit (HOSPITAL_COMMUNITY): Payer: Self-pay

## 2021-08-06 ENCOUNTER — Other Ambulatory Visit (HOSPITAL_COMMUNITY): Payer: Self-pay | Admitting: Nurse Practitioner

## 2021-08-06 MED ORDER — VALACYCLOVIR HCL 1 G PO TABS
1000.0000 mg | ORAL_TABLET | Freq: Every day | ORAL | 0 refills | Status: DC
  Filled 2021-08-06: qty 90, 90d supply, fill #0

## 2021-08-06 MED ORDER — METFORMIN HCL 500 MG PO TABS
ORAL_TABLET | Freq: Two times a day (BID) | ORAL | 1 refills | Status: DC
  Filled 2021-08-06: qty 180, 90d supply, fill #0
  Filled 2021-10-21: qty 180, 90d supply, fill #1

## 2021-08-06 MED ORDER — FOLIC ACID 1 MG PO TABS
1.0000 mg | ORAL_TABLET | Freq: Every day | ORAL | 0 refills | Status: DC
  Filled 2021-08-06: qty 90, 90d supply, fill #0

## 2021-08-06 MED ORDER — APIXABAN 5 MG PO TABS
ORAL_TABLET | Freq: Two times a day (BID) | ORAL | 3 refills | Status: DC
  Filled 2021-08-06: qty 180, 90d supply, fill #0
  Filled 2021-10-21: qty 180, 90d supply, fill #1
  Filled 2022-01-29: qty 180, 90d supply, fill #2
  Filled 2022-04-27: qty 180, 90d supply, fill #3

## 2021-08-06 MED ORDER — UNIFINE PENTIPS 31G X 6 MM MISC
1 refills | Status: DC
  Filled 2021-08-06: qty 100, 90d supply, fill #0
  Filled 2021-11-28: qty 100, 90d supply, fill #1

## 2021-08-06 MED ORDER — ATORVASTATIN CALCIUM 20 MG PO TABS
20.0000 mg | ORAL_TABLET | Freq: Every day | ORAL | 0 refills | Status: DC
  Filled 2021-08-06: qty 90, 90d supply, fill #0

## 2021-08-10 ENCOUNTER — Other Ambulatory Visit (HOSPITAL_COMMUNITY): Payer: Self-pay

## 2021-08-13 ENCOUNTER — Ambulatory Visit (INDEPENDENT_AMBULATORY_CARE_PROVIDER_SITE_OTHER): Payer: No Typology Code available for payment source | Admitting: Family Medicine

## 2021-08-13 ENCOUNTER — Encounter: Payer: Self-pay | Admitting: Family Medicine

## 2021-08-13 ENCOUNTER — Other Ambulatory Visit (HOSPITAL_COMMUNITY): Payer: Self-pay

## 2021-08-13 VITALS — BP 122/76 | HR 88 | Temp 97.3°F | Ht 73.0 in

## 2021-08-13 DIAGNOSIS — Z125 Encounter for screening for malignant neoplasm of prostate: Secondary | ICD-10-CM

## 2021-08-13 DIAGNOSIS — I10 Essential (primary) hypertension: Secondary | ICD-10-CM | POA: Diagnosis not present

## 2021-08-13 DIAGNOSIS — E1159 Type 2 diabetes mellitus with other circulatory complications: Secondary | ICD-10-CM | POA: Diagnosis not present

## 2021-08-13 DIAGNOSIS — E785 Hyperlipidemia, unspecified: Secondary | ICD-10-CM

## 2021-08-13 DIAGNOSIS — E1169 Type 2 diabetes mellitus with other specified complication: Secondary | ICD-10-CM

## 2021-08-13 MED ORDER — ALPRAZOLAM 0.25 MG PO TABS
ORAL_TABLET | Freq: Three times a day (TID) | ORAL | 4 refills | Status: DC | PRN
  Filled 2021-08-13: qty 90, fill #0
  Filled 2021-08-27: qty 90, 30d supply, fill #0
  Filled 2021-09-18 – 2021-10-03 (×2): qty 90, 30d supply, fill #1
  Filled 2021-11-02: qty 90, 30d supply, fill #2
  Filled 2021-12-05: qty 90, 30d supply, fill #3
  Filled 2022-01-06: qty 90, 30d supply, fill #4

## 2021-08-13 MED ORDER — VALACYCLOVIR HCL 1 G PO TABS
1000.0000 mg | ORAL_TABLET | Freq: Every day | ORAL | 2 refills | Status: DC
  Filled 2021-08-13 – 2021-11-13 (×2): qty 90, 90d supply, fill #0

## 2021-08-13 NOTE — Progress Notes (Signed)
? ?  Subjective:  ? ? Patient ID: Travis [name], male    DOB: 05/26/1960, 61 y.o.   MRN: [id number] ? ?Diabetes ?He presents for his follow-up diabetic visit. He has type 2 diabetes mellitus. Current diabetic treatments: treshiba, metformin.  ? ?Essential hypertension - Plan: Urine Microalbumin w/creat. ratio, Lipid panel, Hepatic function panel, Basic metabolic panel, PSA ? ?DM type 2 causing vascular disease ([place]) - Plan: Urine Microalbumin w/creat. ratio, Lipid panel, Hepatic function panel, Basic metabolic panel, PSA ? ?Hyperlipidemia associated with type 2 diabetes mellitus ([place]) - Plan: Urine Microalbumin w/creat. ratio, Lipid panel, Hepatic function panel, Basic metabolic panel, PSA ? ?Screening for prostate cancer - Plan: PSA ?Significant issues with diabetes hypertension hyperlipidemia takes his medicine watches diet stays active ? ?Review of Systems ? ?   ?Objective:  ? Physical Exam ?General-in no acute distress ?Eyes-no discharge ?Lungs-respiratory rate normal, CTA ?CV-no murmurs,RRR ?Extremities skin warm dry no edema ?Neuro grossly normal ?Behavior normal, alert ? ? ? ? ?   ?Assessment & Plan:  ?1. Essential hypertension ?Blood pressure good control continue current measures check lab work ?- Urine Microalbumin w/creat. ratio ?- Lipid panel ?- Hepatic function panel ?- Basic metabolic panel ?- PSA ? ?2. DM type 2 causing vascular disease ([place]) ?Diabetes under good control followed by endocrinology follow-up with them in July follow-up with [place] in August ?- Urine Microalbumin w/creat. ratio ?- Lipid panel ?- Hepatic function panel ?- Basic metabolic panel ?- PSA ? ?3. Hyperlipidemia associated with type 2 diabetes mellitus ([place]) ?Hyperlipidemia continue medication check lab work ?- Urine Microalbumin w/creat. ratio ?- Lipid panel ?- Hepatic function panel ?- Basic metabolic panel ?- PSA ? ?4. Screening for prostate cancer ?Screening PSA before next follow-up visit ?- PSA ? ? ?

## 2021-08-18 ENCOUNTER — Other Ambulatory Visit (HOSPITAL_COMMUNITY): Payer: Self-pay

## 2021-08-21 ENCOUNTER — Other Ambulatory Visit (HOSPITAL_COMMUNITY): Payer: Self-pay

## 2021-08-27 ENCOUNTER — Other Ambulatory Visit (HOSPITAL_COMMUNITY): Payer: Self-pay

## 2021-09-18 ENCOUNTER — Other Ambulatory Visit (HOSPITAL_COMMUNITY): Payer: Self-pay

## 2021-09-19 ENCOUNTER — Other Ambulatory Visit (HOSPITAL_COMMUNITY): Payer: Self-pay

## 2021-10-01 ENCOUNTER — Ambulatory Visit (INDEPENDENT_AMBULATORY_CARE_PROVIDER_SITE_OTHER): Payer: No Typology Code available for payment source | Admitting: Family Medicine

## 2021-10-01 VITALS — BP 145/93 | HR 77 | Temp 97.1°F | Ht 73.0 in | Wt 192.2 lb

## 2021-10-01 DIAGNOSIS — Z23 Encounter for immunization: Secondary | ICD-10-CM

## 2021-10-01 DIAGNOSIS — H6123 Impacted cerumen, bilateral: Secondary | ICD-10-CM | POA: Diagnosis not present

## 2021-10-01 DIAGNOSIS — I10 Essential (primary) hypertension: Secondary | ICD-10-CM | POA: Diagnosis not present

## 2021-10-01 DIAGNOSIS — E1159 Type 2 diabetes mellitus with other circulatory complications: Secondary | ICD-10-CM | POA: Diagnosis not present

## 2021-10-01 NOTE — Progress Notes (Signed)
   Subjective:    Patient ID: Travis [name], male    DOB: 11-02-1960, 61 y.o.   MRN: [id number]  HPI  Patient here for follow up on disability forms. We discussed how to minimize risk of strokes by keeping blood pressure diabetes and cholesterol under good control Discussed healthy eating and regular physical activity Patient states he does get stressed when he comes to the doctor office Relates compliance with his medicines Previous labs reviewed  Patient needs ears to be cleaned out. He says he has a build of wax in ears.  Review of Systems     Objective:   Physical Exam General-in no acute distress Eyes-no discharge Lungs-respiratory rate normal, CTA CV-no murmurs,RRR Extremities skin warm dry no edema Neuro grossly normal Behavior normal, alert  Bilateral cerumen impaction      Assessment & Plan:  Cerumen impaction follow-up for ear irrigation with nurse visit Blood pressure slightly elevated He will do a follow-up visit within the next few weeks with a wellness at that time he will bring his cuff we will double check blood pressure Continue current medications Healthy diet recommended

## 2021-10-03 ENCOUNTER — Other Ambulatory Visit (HOSPITAL_COMMUNITY): Payer: Self-pay

## 2021-10-05 ENCOUNTER — Other Ambulatory Visit (HOSPITAL_COMMUNITY): Payer: Self-pay

## 2021-10-06 ENCOUNTER — Other Ambulatory Visit (HOSPITAL_COMMUNITY): Payer: Self-pay

## 2021-10-14 ENCOUNTER — Other Ambulatory Visit: Payer: Self-pay

## 2021-10-20 ENCOUNTER — Other Ambulatory Visit: Payer: No Typology Code available for payment source

## 2021-10-21 ENCOUNTER — Other Ambulatory Visit (HOSPITAL_COMMUNITY): Payer: Self-pay

## 2021-10-27 ENCOUNTER — Other Ambulatory Visit: Payer: No Typology Code available for payment source

## 2021-10-29 ENCOUNTER — Other Ambulatory Visit: Payer: No Typology Code available for payment source

## 2021-11-02 ENCOUNTER — Other Ambulatory Visit (HOSPITAL_COMMUNITY): Payer: Self-pay

## 2021-11-13 ENCOUNTER — Other Ambulatory Visit: Payer: Self-pay | Admitting: Family Medicine

## 2021-11-13 ENCOUNTER — Other Ambulatory Visit (HOSPITAL_COMMUNITY): Payer: Self-pay

## 2021-11-13 MED ORDER — ATORVASTATIN CALCIUM 20 MG PO TABS
20.0000 mg | ORAL_TABLET | Freq: Every day | ORAL | 0 refills | Status: DC
  Filled 2021-11-13: qty 90, 90d supply, fill #0

## 2021-11-14 ENCOUNTER — Other Ambulatory Visit (HOSPITAL_COMMUNITY): Payer: Self-pay

## 2021-11-18 ENCOUNTER — Encounter: Payer: No Typology Code available for payment source | Admitting: Family Medicine

## 2021-11-27 ENCOUNTER — Ambulatory Visit (INDEPENDENT_AMBULATORY_CARE_PROVIDER_SITE_OTHER): Payer: No Typology Code available for payment source | Admitting: Family Medicine

## 2021-11-27 ENCOUNTER — Other Ambulatory Visit (HOSPITAL_COMMUNITY): Payer: Self-pay

## 2021-11-27 VITALS — BP 140/80 | HR 75 | Temp 98.1°F | Ht 73.0 in | Wt 192.0 lb

## 2021-11-27 DIAGNOSIS — Z0000 Encounter for general adult medical examination without abnormal findings: Secondary | ICD-10-CM | POA: Diagnosis not present

## 2021-11-27 MED ORDER — VALACYCLOVIR HCL 1 G PO TABS
1000.0000 mg | ORAL_TABLET | Freq: Every day | ORAL | 3 refills | Status: DC
Start: 2021-11-27 — End: 2022-05-11
  Filled 2021-11-27 – 2022-03-12 (×2): qty 90, 90d supply, fill #0

## 2021-11-27 MED ORDER — ATORVASTATIN CALCIUM 20 MG PO TABS
20.0000 mg | ORAL_TABLET | Freq: Every day | ORAL | 1 refills | Status: DC
  Filled 2021-11-27 – 2022-01-29 (×2): qty 90, 90d supply, fill #0

## 2021-11-27 MED ORDER — METFORMIN HCL 500 MG PO TABS
ORAL_TABLET | Freq: Two times a day (BID) | ORAL | 1 refills | Status: DC
Start: 2021-11-27 — End: 2021-11-30
  Filled 2021-11-27: qty 180, fill #0

## 2021-11-27 MED ORDER — FLUOXETINE HCL 20 MG PO CAPS
20.0000 mg | ORAL_CAPSULE | Freq: Every day | ORAL | 1 refills | Status: DC
  Filled 2021-11-27: qty 90, 90d supply, fill #0
  Filled 2022-04-27: qty 90, 90d supply, fill #1

## 2021-11-27 NOTE — Progress Notes (Unsigned)
   Subjective:    Patient ID: Travis [name], male    DOB: Aug 18, 1960, 61 y.o.   MRN: [id number]  HPI The patient comes in today for a wellness visit.    A review of their health history was completed.  A review of medications was also completed.  Any needed refills; all  Eating habits: fair  Falls/  MVA accidents in past few months: no  Regular exercise: outdoors  Specialist pt sees on regular basis: endocrine  Preventative health issues were discussed.   Additional concerns: request for lab work , refill meds   Review of Systems     Objective:   Physical Exam General-in no acute distress Eyes-no discharge Lungs-respiratory rate normal, CTA CV-no murmurs,RRR Extremities skin warm dry no edema Neuro grossly normal Behavior normal, alert  Prostate exam normal      Assessment & Plan:  1. Well adult exam Adult wellness-complete.wellness physical was conducted today. Importance of diet and exercise were discussed in detail.  Importance of stress reduction and healthy living were discussed.  In addition to this a discussion regarding safety was also covered.  We also reviewed over immunizations and gave recommendations regarding current immunization needed for age.   In addition to this additional areas were also touched on including: Preventative health exams needed:  Colonoscopy 2031 is his next Patient reminded to get flu vaccine in the fall Healthy diet regular activity Follow through with diabetic doctor on a regular basis Follow-up here within 6 months   Patient was advised yearly wellness exam

## 2021-11-27 NOTE — Patient Instructions (Addendum)
Please do your labs! Next colonoscopy in 2031 Flu vaccine this fall You are up-to-date on your pneumonia vaccine  Please check your BP readings and send me those with a mychart message  Follow up in 6 months  DASH Eating Plan DASH stands for Dietary Approaches to Stop Hypertension. The DASH eating plan is a healthy eating plan that has been shown to: Reduce high blood pressure (hypertension). Reduce your risk for type 2 diabetes, heart disease, and stroke. Help with weight loss. What are tips for following this plan? Reading food labels Check food labels for the amount of salt (sodium) per serving. Choose foods with less than 5 percent of the Daily Value of sodium. Generally, foods with less than 300 milligrams (mg) of sodium per serving fit into this eating plan. To find whole grains, look for the word "whole" as the first word in the ingredient list. Shopping Buy products labeled as "low-sodium" or "no salt added." Buy fresh foods. Avoid canned foods and pre-made or frozen meals. Cooking Avoid adding salt when cooking. Use salt-free seasonings or herbs instead of table salt or sea salt. Check with your health care provider or pharmacist before using salt substitutes. Do not fry foods. Cook foods using healthy methods such as baking, boiling, grilling, roasting, and broiling instead. Cook with heart-healthy oils, such as olive, canola, avocado, soybean, or sunflower oil. Meal planning  Eat a balanced diet that includes: 4 or more servings of fruits and 4 or more servings of vegetables each day. Try to fill one-half of your plate with fruits and vegetables. 6-8 servings of whole grains each day. Less than 6 oz (170 g) of lean meat, poultry, or fish each day. A 3-oz (85-g) serving of meat is about the same size as a deck of cards. One egg equals 1 oz (28 g). 2-3 servings of low-fat dairy each day. One serving is 1 cup (237 mL). 1 serving of nuts, seeds, or beans 5 times each week. 2-3  servings of heart-healthy fats. Healthy fats called omega-3 fatty acids are found in foods such as walnuts, flaxseeds, fortified milks, and eggs. These fats are also found in cold-water fish, such as sardines, salmon, and mackerel. Limit how much you eat of: Canned or prepackaged foods. Food that is high in trans fat, such as some fried foods. Food that is high in saturated fat, such as fatty meat. Desserts and other sweets, sugary drinks, and other foods with added sugar. Full-fat dairy products. Do not salt foods before eating. Do not eat more than 4 egg yolks a week. Try to eat at least 2 vegetarian meals a week. Eat more home-cooked food and less restaurant, buffet, and fast food. Lifestyle When eating at a restaurant, ask that your food be prepared with less salt or no salt, if possible. If you drink alcohol: Limit how much you use to: 0-1 drink a day for women who are not pregnant. 0-2 drinks a day for men. Be aware of how much alcohol is in your drink. In the [place] one drink equals one 12 oz bottle of beer (355 mL), one 5 oz glass of wine (148 mL), or one 1 oz glass of hard liquor (44 mL). General information Avoid eating more than 2,300 mg of salt a day. If you have hypertension, you may need to reduce your sodium intake to 1,500 mg a day. Work with your health care provider to maintain a healthy body weight or to lose weight. Ask what an ideal weight  is for you. Get at least 30 minutes of exercise that causes your heart to beat faster (aerobic exercise) most days of the week. Activities may include walking, swimming, or biking. Work with your health care provider or dietitian to adjust your eating plan to your individual calorie needs. What foods should I eat? Fruits All fresh, dried, or frozen fruit. Canned fruit in natural juice (without added sugar). Vegetables Fresh or frozen vegetables (raw, steamed, roasted, or grilled). Low-sodium or reduced-sodium tomato and vegetable  juice. Low-sodium or reduced-sodium tomato sauce and tomato paste. Low-sodium or reduced-sodium canned vegetables. Grains Whole-grain or whole-wheat bread. Whole-grain or whole-wheat pasta. Brown rice. [place]. Bulgur. Whole-grain and low-sodium cereals. Pita bread. Low-fat, low-sodium crackers. Whole-wheat flour tortillas. Meats and other proteins Skinless chicken or [place]. Ground chicken or [place]. Pork with fat trimmed off. Fish and seafood. Egg whites. Dried beans, peas, or lentils. Unsalted nuts, nut butters, and seeds. Unsalted canned beans. Lean cuts of beef with fat trimmed off. Low-sodium, lean precooked or cured meat, such as sausages or meat loaves. Dairy Low-fat (1%) or fat-free (skim) milk. Reduced-fat, low-fat, or fat-free cheeses. Nonfat, low-sodium ricotta or cottage cheese. Low-fat or nonfat yogurt. Low-fat, low-sodium cheese. Fats and oils Soft margarine without trans fats. Vegetable oil. Reduced-fat, low-fat, or light mayonnaise and salad dressings (reduced-sodium). Canola, safflower, olive, avocado, soybean, and sunflower oils. Avocado. Seasonings and condiments Herbs. Spices. Seasoning mixes without salt. Other foods Unsalted popcorn and pretzels. Fat-free sweets. The items listed above may not be a complete list of foods and beverages you can eat. Contact a dietitian for more information. What foods should I avoid? Fruits Canned fruit in a light or heavy syrup. Fried fruit. Fruit in cream or butter sauce. Vegetables Creamed or fried vegetables. Vegetables in a cheese sauce. Regular canned vegetables (not low-sodium or reduced-sodium). Regular canned tomato sauce and paste (not low-sodium or reduced-sodium). Regular tomato and vegetable juice (not low-sodium or reduced-sodium). Angie Fava. Olives. Grains Baked goods made with fat, such as croissants, muffins, or some breads. Dry pasta or rice meal packs. Meats and other proteins Fatty cuts of meat. Ribs. Fried meat.  Berniece Salines. Bologna, salami, and other precooked or cured meats, such as sausages or meat loaves. Fat from the back of a pig (fatback). Bratwurst. Salted nuts and seeds. Canned beans with added salt. Canned or smoked fish. Whole eggs or egg yolks. Chicken or [place] with skin. Dairy Whole or 2% milk, cream, and half-and-half. Whole or full-fat cream cheese. Whole-fat or sweetened yogurt. Full-fat cheese. Nondairy creamers. Whipped toppings. Processed cheese and cheese spreads. Fats and oils Butter. Stick margarine. Lard. Shortening. Ghee. Bacon fat. Tropical oils, such as coconut, palm kernel, or palm oil. Seasonings and condiments Onion salt, garlic salt, seasoned salt, table salt, and sea salt. Worcestershire sauce. Tartar sauce. Barbecue sauce. Teriyaki sauce. Soy sauce, including reduced-sodium. Steak sauce. Canned and packaged gravies. Fish sauce. Oyster sauce. Cocktail sauce. Store-bought horseradish. Ketchup. Mustard. Meat flavorings and tenderizers. Bouillon cubes. Hot sauces. Pre-made or packaged marinades. Pre-made or packaged taco seasonings. Relishes. Regular salad dressings. Other foods Salted popcorn and pretzels. The items listed above may not be a complete list of foods and beverages you should avoid. Contact a dietitian for more information. Where to find more information National Heart, Lung, and Blood Institute: https://wilson-eaton.com/ American Heart Association: www.heart.org Academy of Nutrition and Dietetics: www.eatright.[place]: www.kidney.org Summary The DASH eating plan is a healthy eating plan that has been shown to reduce high blood pressure (hypertension).  It may also reduce your risk for type 2 diabetes, heart disease, and stroke. When on the DASH eating plan, aim to eat more fresh fruits and vegetables, whole grains, lean proteins, low-fat dairy, and heart-healthy fats. With the DASH eating plan, you should limit salt (sodium) intake to 2,300 mg a day. If  you have hypertension, you may need to reduce your sodium intake to 1,500 mg a day. Work with your health care provider or dietitian to adjust your eating plan to your individual calorie needs. This information is not intended to replace advice given to you by your health care provider. Make sure you discuss any questions you have with your health care provider. Document Revised: 03/23/2019 Document Reviewed: 03/23/2019 Elsevier Patient Education  [place].

## 2021-11-28 ENCOUNTER — Other Ambulatory Visit (HOSPITAL_COMMUNITY): Payer: Self-pay

## 2021-11-28 ENCOUNTER — Other Ambulatory Visit: Payer: Self-pay | Admitting: Family Medicine

## 2021-11-30 ENCOUNTER — Other Ambulatory Visit (HOSPITAL_COMMUNITY): Payer: Self-pay

## 2021-11-30 ENCOUNTER — Ambulatory Visit (INDEPENDENT_AMBULATORY_CARE_PROVIDER_SITE_OTHER): Payer: No Typology Code available for payment source | Admitting: Nurse Practitioner

## 2021-11-30 ENCOUNTER — Encounter: Payer: Self-pay | Admitting: Nurse Practitioner

## 2021-11-30 VITALS — BP 154/78 | HR 85 | Ht 73.0 in | Wt 190.0 lb

## 2021-11-30 DIAGNOSIS — E559 Vitamin D deficiency, unspecified: Secondary | ICD-10-CM | POA: Diagnosis not present

## 2021-11-30 DIAGNOSIS — E782 Mixed hyperlipidemia: Secondary | ICD-10-CM | POA: Diagnosis not present

## 2021-11-30 DIAGNOSIS — E1159 Type 2 diabetes mellitus with other circulatory complications: Secondary | ICD-10-CM | POA: Diagnosis not present

## 2021-11-30 DIAGNOSIS — D3502 Benign neoplasm of left adrenal gland: Secondary | ICD-10-CM | POA: Diagnosis not present

## 2021-11-30 LAB — POCT GLYCOSYLATED HEMOGLOBIN (HGB A1C): HbA1c POC (<> result, manual entry): 7.5 % (ref 4.0–5.6)

## 2021-11-30 MED ORDER — FOLIC ACID 1 MG PO TABS
1.0000 mg | ORAL_TABLET | Freq: Every day | ORAL | 3 refills | Status: DC
  Filled 2021-11-30: qty 90, 90d supply, fill #0
  Filled 2022-03-12: qty 90, 90d supply, fill #1
  Filled 2022-06-04: qty 90, 90d supply, fill #2
  Filled 2022-08-28: qty 90, 90d supply, fill #3

## 2021-11-30 MED ORDER — INSULIN DEGLUDEC 100 UNIT/ML ~~LOC~~ SOPN
35.0000 [IU] | PEN_INJECTOR | Freq: Every day | SUBCUTANEOUS | 3 refills | Status: DC
Start: 2021-11-30 — End: 2022-04-01
  Filled 2021-11-30 – 2021-12-26 (×2): qty 30, 85d supply, fill #0
  Filled 2022-03-22: qty 30, 85d supply, fill #1

## 2021-11-30 MED ORDER — UNIFINE PENTIPS 31G X 6 MM MISC
1 refills | Status: DC
Start: 2021-11-30 — End: 2022-06-25
  Filled 2021-12-05 – 2022-02-27 (×2): qty 100, 90d supply, fill #0
  Filled 2022-06-25: qty 100, 90d supply, fill #1

## 2021-11-30 MED ORDER — METFORMIN HCL 500 MG PO TABS
ORAL_TABLET | Freq: Two times a day (BID) | ORAL | 1 refills | Status: DC
  Filled 2021-11-30: qty 180, fill #0
  Filled 2022-01-29: qty 180, 90d supply, fill #0

## 2021-11-30 NOTE — Progress Notes (Signed)
11/30/2021, 11:57 AM              Endocrinology follow-up note    Subjective:    Patient ID: Travis [name], male    DOB: Jan 03, 1961.  Travis [name] is being seen in follow-up  for management of currently uncontrolled symptomatic diabetes requested by  Kathyrn Drown, MD.   Past Medical History:  Diagnosis Date   A-fib ([place]) 02/2016   found on loop recorder   Adhesive capsulitis of left shoulder 08/02/2014   Anxiety    Arthritis    Blood transfusion without reported diagnosis    Chronic headaches    Complication of anesthesia    bleed after last shoulder-had to stay overnight   Depression    Diabetes mellitus    Diabetic peripheral neuropathy ([place]) 03/28/2013   Diverticulosis    Dizziness    Hernia, inguinal    Hyperlipidemia    Hypertension    Impingement syndrome of left shoulder 08/02/2014   Ischemic colitis ([place])    Multi-infarct state 10/14/2014   Neuropathy of lower extremity    Night sweats    every once in a while   Sleep apnea    no CPAP   Stroke ([place]) 02/2015   Syncope and collapse    Past Surgical History:  Procedure Laterality Date   BIOPSY  06/14/2019   Procedure: BIOPSY;  Surgeon: [name], MD;  Location: [facility];  Service: Gastroenterology;;   CERVICAL [place]  2003   1/3 removed for diverticulitis   COLONOSCOPY     COLONOSCOPY WITH PROPOFOL N/A 06/14/2019   Procedure: COLONOSCOPY WITH PROPOFOL;  Surgeon: [name], MD;  Location: [facility];  Service: Gastroenterology;  Laterality: N/A;   EP IMPLANTABLE DEVICE N/A 05/01/2015   Procedure: Loop Recorder Insertion;  Surgeon: [name], MD;  Location: [facility];  Service: Cardiovascular;  Laterality: N/A;   INGUINAL HERNIA REPAIR Bilateral    KNEE ARTHROSCOPY Right    SHOULDER ARTHROSCOPY Right    1   SHOULDER ARTHROSCOPY Left 08/02/2014   Procedure: LEFT SHOULDER SCOPE  DEBRIDEMENT/ACROMIOPLASTY;  Surgeon: [name], MD;  Location: [facility];  Service: Orthopedics;  Laterality: Left;  ANESTHESIA: GENERAL, PRE/POST OP SCALENE   TEE WITHOUT CARDIOVERSION N/A 03/03/2015   Procedure: TRANSESOPHAGEAL ECHOCARDIOGRAM (TEE);  Surgeon: [name], MD;  Location: [facility];  Service: Cardiology;  Laterality: N/A;   UMBILICAL HERNIA REPAIR     with other hernia repair with mesh   WRIST SURGERY Right    fusion   Social History   Socioeconomic History   Marital status: Married    Spouse name: Not on file   Number of children: 2   Years of education: College   Highest education level: Not on file  Occupational History   Occupation: disabled  Tobacco Use   Smoking status: Former    Packs/day: 1.00    Years: 8.00    Total pack years: 8.00    Types: Cigarettes    Start date: 06/07/1970    Quit date: 05/03/1978    Years since quitting: 43.6   Smokeless tobacco: Former  Types: Sarina Ser    Quit date: 05/03/1978  Vaping Use   Vaping Use: Never used  Substance and Sexual Activity   Alcohol use: No    Alcohol/week: 1.0 standard drink of alcohol    Types: 1 Cans of beer per week    Comment: h/o heavy use in the past   Drug use: No   Sexual activity: Yes  Other Topics Concern   Not on file  Social History Narrative   Drinks some coffee, Drink diet sodas and tea   Social Determinants of Health   Financial Resource Strain: Not on file  Food Insecurity: Not on file  Transportation Needs: Not on file  Physical Activity: Not on file  Stress: Not on file  Social Connections: Not on file   Outpatient Encounter Medications as of 11/30/2021  Medication Sig   Accu-Chek FastClix Lancets MISC USE TO CHECK BLOOD SUGAR 2 TIMES A DAY   ALPRAZolam (XANAX) 0.25 MG tablet TAKE 1 TABLET BY MOUTH 3 TIMES DAILY AS NEEDED   apixaban (ELIQUIS) 5 MG TABS tablet TAKE 1 TABLET BY MOUTH 2 TIMES DAILY.   atorvastatin (LIPITOR) 20 MG tablet Take 1 tablet  (20 mg total) by mouth daily.   Blood Pressure Monitoring (OMRON 3 SERIES BP MONITOR) DEVI USE AS DIRECTED.   Cholecalciferol 50 MCG (2000 UT) CAPS Take 1 capsule (2,000 Units total) by mouth daily with breakfast.   FLUoxetine (PROZAC) 20 MG capsule Take 1 capsule by mouth daily.   fluticasone (FLONASE) 50 MCG/ACT nasal spray Place 1 spray into both nostrils daily.   gabapentin (NEURONTIN) 300 MG capsule TAKE 1 CAPSULE BY MOUTH IN THE MORNING, 1 CAPSULE BY MOUTH IN THE AFTERNOON THEN 2 CAPSULES NIGHTLY   hyoscyamine (LEVSIN SL) 0.125 MG SL tablet Place 1 tablet (0.125 mg total) under the tongue daily before breakfast.   insulin degludec (TRESIBA) 100 UNIT/ML FlexTouch Pen Inject 35 Units into the skin at bedtime.   Insulin Pen Needle (UNIFINE PENTIPS) 31G X 6 MM MISC USE TO INJECT INSULIN DAILY AS DIRECTED   loratadine (CLARITIN) 10 MG tablet Take 1 tablet (10 mg total) by mouth daily.   metFORMIN (GLUCOPHAGE) 500 MG tablet TAKE 1 TABLET BY MOUTH TWO TIMES DAILY WITH A MEAL   Multiple Vitamin (MULTIVITAMIN WITH MINERALS) TABS tablet Take 1 tablet daily by mouth.   valACYclovir (VALTREX) 1000 MG tablet Take 1 tablet (1,000 mg total) by mouth daily.   [DISCONTINUED] Blood Glucose Monitoring Suppl (BLOOD GLUCOSE MONITOR SYSTEM) w/Device KIT Test glucose 2 time daily.  Meter/strips/lancets. Please dispense per patient/insurance preferrence   [DISCONTINUED] insulin degludec (TRESIBA) 100 UNIT/ML FlexTouch Pen Inject 35 Units into the skin at bedtime.   [DISCONTINUED] Insulin Glargine (BASAGLAR KWIKPEN) 100 UNIT/ML INJECT 50 UNITS INTO THE SKIN AT BEDTIME   [DISCONTINUED] Insulin Pen Needle (UNIFINE PENTIPS) 31G X 6 MM MISC USE TO INJECT INSULIN DAILY AS DIRECTED   [DISCONTINUED] metFORMIN (GLUCOPHAGE) 500 MG tablet TAKE 1 TABLET BY MOUTH TWO TIMES DAILY WITH A MEAL   No facility-administered encounter medications on file as of 11/30/2021.    ALLERGIES: Allergies  Allergen Reactions   Lisinopril  Cough    Patient/spouse is not aware/familiar with why this is listed as an allergy Not a true allergy but on this list to avoid ACE inhibitors-SA Luking MD    VACCINATION STATUS: Immunization History  Administered Date(s) Administered   Influenza Split 03/28/2013   Influenza,inj,Quad PF,6+ Mos 02/18/2014, 01/23/2015, 02/06/2016, 02/23/2017, 02/24/2018, 01/31/2020, 02/18/2021   Influenza-Unspecified  01/02/2011, 02/09/2019   Moderna Sars-Covid-2 Vaccination 07/21/2019, 08/22/2019, 05/01/2020   PNEUMOCOCCAL CONJUGATE-20 10/01/2021   Pneumococcal Polysaccharide-23 01/31/2010, 03/01/2015   Tdap 02/21/2003, 02/18/2014   Zoster Recombinat (Shingrix) 04/19/2019, 06/29/2019    Diabetes He presents for his follow-up diabetic visit. He has type 2 diabetes mellitus. Onset time: He was diagnosed at approximate age of 68 years. His disease course has been improving (He has prior history of heavy alcohol use/abuse.). Hypoglycemia symptoms include nervousness/anxiousness, sweats and tremors. Pertinent negatives for hypoglycemia include no confusion, headaches, pallor or seizures. Associated symptoms include foot paresthesias. Pertinent negatives for diabetes include no chest pain, no fatigue, no polydipsia, no polyphagia, no polyuria and no weakness. There are no hypoglycemic complications. Symptoms are stable. Diabetic complications include a CVA, nephropathy and peripheral neuropathy. Risk factors for coronary artery disease include dyslipidemia, diabetes mellitus, hypertension, family history, male sex, tobacco exposure and sedentary lifestyle. Current diabetic treatment includes insulin injections and oral agent (monotherapy). He is compliant with treatment most of the time. His weight is stable. He is following a generally unhealthy diet. When asked about meal planning, he reported none. He has had a previous visit with a dietitian. He participates in exercise intermittently. His home blood glucose trend is  fluctuating minimally. His overall blood glucose range is 130-140 mg/dl. (He presents today with his meter, no logs, showing inconsistent glucose monitoring.  His POCT A1c today is 7.5%, improving from last visit of 7.7%.  He does note some low readings, likely related to meal timing.  Analysis of his meter shows 7-day average of 130 (4 readings), 14-day average of 130 (7 readings), 30-day average of 117 (16 readings).) An ACE inhibitor/angiotensin II receptor blocker is not being taken. He does not see a podiatrist.Eye exam is current.  Hyperlipidemia This is a chronic problem. The current episode started more than 1 year ago. The problem is controlled. Recent lipid tests were reviewed and are normal. Exacerbating diseases include chronic renal disease and diabetes. Factors aggravating his hyperlipidemia include smoking. Pertinent negatives include no chest pain, myalgias or shortness of breath. Current antihyperlipidemic treatment includes statins. The current treatment provides moderate improvement of lipids. There are no compliance problems.  Risk factors for coronary artery disease include dyslipidemia, diabetes mellitus, hypertension, male sex, family history and a sedentary lifestyle.  Hypertension This is a chronic problem. The current episode started more than 1 year ago. The problem has been resolved since onset. The problem is controlled. Associated symptoms include sweats. Pertinent negatives include no chest pain, headaches, neck pain, palpitations or shortness of breath. There are no associated agents to hypertension. Risk factors for coronary artery disease include dyslipidemia, diabetes mellitus, family history, male gender, sedentary lifestyle and smoking/tobacco exposure. Past treatments include nothing. Compliance problems include diet.  Hypertensive end-organ damage includes kidney disease, CAD/MI and CVA. Identifiable causes of hypertension include chronic renal disease and sleep apnea.     Review of systems  Constitutional: + Minimally fluctuating body weight,  current Body mass index is 25.07 kg/m. , no fatigue, no subjective hyperthermia, no subjective hypothermia Eyes: no blurry vision, no xerophthalmia ENT: no sore throat, no nodules palpated in throat, no dysphagia/odynophagia, no hoarseness Cardiovascular: no chest pain, no shortness of breath, no palpitations, no leg swelling Respiratory: no cough, no shortness of breath Gastrointestinal: no nausea/vomiting/diarrhea Musculoskeletal: no muscle/joint aches Skin: no rashes, no hyperemia Neurological: no tremors, no numbness, no tingling, no dizziness Psychiatric: no depression, no anxiety  Objective:    BP (!) 154/78  Pulse 85   Ht _0  (1.854 m)   Wt 190 lb (86.2 kg)   BMI 25.07 kg/m   Wt Readings from Last 3 Encounters:  11/30/21 190 lb (86.2 kg)  11/27/21 192 lb (87.1 kg)  10/01/21 192 lb 3.2 oz (87.2 kg)    BP Readings from Last 3 Encounters:  11/30/21 (!) 154/78  11/27/21 140/80  10/01/21 (!) 145/93     Physical Exam- Limited  Constitutional:  Body mass index is 25.07 kg/m. , not in acute distress, + inattentive, hyperactive state of mind with rapid/pressured speech Eyes:  EOMI, no exophthalmos Neck: Supple Cardiovascular: RRR, no murmurs, rubs, or gallops, no edema Respiratory: Adequate breathing efforts, no crackles, rales, rhonchi, or wheezing Musculoskeletal: no gross deformities, strength intact in all four extremities, no gross restriction of joint movements Skin:  no rashes, no hyperemia- see foot exam below Neurological: no tremor with outstretched hands   Diabetic Foot Exam - Simple   No data filed      CMP     Component Value Date/Time   NA 142 07/24/2021 1119   K 3.8 07/24/2021 1119   CL 104 07/24/2021 1119   CO2 24 07/24/2021 1119   GLUCOSE 152 (H) 07/24/2021 1119   GLUCOSE 141 (H) 05/12/2021 1520   BUN 12 07/24/2021 1119   CREATININE 0.98 07/24/2021 1119    CREATININE 0.82 02/16/2014 1053   CALCIUM 9.1 07/24/2021 1119   PROT 6.9 07/24/2021 1119   ALBUMIN 4.3 07/24/2021 1119   AST 15 07/24/2021 1119   ALT 7 07/24/2021 1119   ALKPHOS 91 07/24/2021 1119   BILITOT 0.4 07/24/2021 1119   GFRNONAA >60 03/31/2021 1807   GFRAA 92 05/30/2020 1156     Diabetic Labs (most recent): Lab Results  Component Value Date   HGBA1C 7.5 11/30/2021   HGBA1C 7.7 (A) 07/28/2021   HGBA1C 6.5 03/30/2021   MICROALBUR 5.7 12/07/2019   MICROALBUR 0.8 02/16/2014     Lipid Panel ( most recent) Lipid Panel     Component Value Date/Time   CHOL 93 (L) 07/24/2021 1119   TRIG 54 07/24/2021 1119   HDL 30 (L) 07/24/2021 1119   CHOLHDL 3.1 07/24/2021 1119   CHOLHDL 3.2 05/03/2016 0545   VLDL 16 05/03/2016 0545   LDLCALC 50 07/24/2021 1119      Lab Results  Component Value Date   TSH 1.390 07/24/2021   TSH 2.120 12/07/2019   TSH 2.12 12/07/2019   TSH 1.960 03/12/2017   TSH 1.460 10/07/2016   TSH 0.720 05/02/2016   TSH 1.895 10/14/2014   FREET4 1.25 07/24/2021   FREET4 1.19 12/07/2019   FREET4 1.21 10/07/2016      Assessment & Plan:   1) DM type 2 causing vascular disease ([place])  - Travis [name] has currently uncontrolled symptomatic type 2 DM since  61 years of age.  He presents today with his meter, no logs, showing inconsistent glucose monitoring.  His POCT A1c today is 7.5%, improving from last visit of 7.7%.  He does note some low readings, likely related to meal timing.  Analysis of his meter shows 7-day average of 130 (4 readings), 14-day average of 130 (7 readings), 30-day average of 117 (16 readings).  -his diabetes is complicated by CVA, history of smoking, history of heavy alcohol use/abuse in the past and MONTE ZINNI remains at a high risk for more acute and chronic complications which include CAD, CVA, CKD, retinopathy, and neuropathy. These are all discussed in  detail with the patient.  - Nutritional counseling repeated at each  appointment due to patients tendency to fall back in to old habits.  - The patient admits there is a room for improvement in their diet and drink choices. -  Suggestion is made for the patient to avoid simple carbohydrates from their diet including Cakes, Sweet Desserts / Pastries, Ice Cream, Soda (diet and regular), Sweet Tea, Candies, Chips, Cookies, Sweet Pastries, Store Bought Juices, Alcohol in Excess of 1-2 drinks a day, Artificial Sweeteners, Coffee Creamer, and "Sugar-free" Products. This will help patient to have stable blood glucose profile and potentially avoid unintended weight gain.   - I encouraged the patient to switch to unprocessed or minimally processed complex starch and increased protein intake (animal or plant source), fruits, and vegetables.   - Patient is advised to stick to a routine mealtimes to eat 3 meals a day and avoid unnecessary snacks (to snack only to correct hypoglycemia).  - I have approached him with the following individualized plan to manage diabetes and patient agrees:   - There could be a component of pancreatic endocrine and exocrine insufficiency given his prior history of heavy alcohol use.  Therefore, he may need multiple daily injections of insulin to control diabetes in the near future.  -#1 priority in this patient is to avoid hypoglycemia.    -He is advised to continue Tresiba 35 units SQ nightly and Metformin 500 mg po twice daily with meals.   -He is advised to monitor blood glucose twice daily, before breakfast and before bed, and call the clinic if he has readings less than 70 or greater than 200 for 3 tests in a row.  -Patient is not a candidate for SGLT2 inhibitors, nor incretin therapy due to his body habitus.  - Patient specific target  A1c;  LDL, HDL, Triglycerides, and  Waist Circumference were discussed in detail.  2) BP/HTN:   His blood pressure is controlled to target.  He is not on any antihypertensive medications at this time.  He  will be considered for low dose ARB on subsequent visits if BP becomes elevated over 140/90.  He does monitor BP at home and reports normal readings.  3) Lipids/HPL:  His most recent lipid panel from 07/24/21 shows controlled LDL at 50.  He is advised to continue Lipitor 20 mg po daily at bedtime.  Side effects and precautions discussed with him.    4) Left adrenal adenoma- During a past hospitalization for GI bleed from ischemic colitis a CT scan showed incidental finding of 2.1 cm left adrenal adenoma.  Subsequent plasma metanephrines were within normal limits, considered nonfunctional.  He will not need intervention for it at this time. His repeat 24-hr urine metanephrines, catecholamines, cortisol, and aldosterone were all still normal favoring benignity.    5)  Weight/Diet: His Body mass index is 25.07 kg/m.  He is not a weight loss candidate.  CDE Consult  is in progress , exercise, and detailed carbohydrates information provided.  If he experiences unintentional weight loss, he will be considered for Creon therapy.   6) Chronic Care/Health Maintenance: -he is on Statin medications and  is encouraged to continue to follow up with Ophthalmology, Dentist,  Podiatrist at least yearly or according to recommendations, and advised to  stay away from smoking. I have recommended yearly flu vaccine and pneumonia vaccination at least every 5 years; moderate intensity exercise for up to 150 minutes weekly; and  sleep for at least 7 hours  a day.  - I advised patient to maintain close follow up with Kathyrn Drown, MD for primary care needs.       I spent 40 minutes in the care of the patient today including review of labs from [facility], Lipids, Thyroid Function, Hematology (current and previous including abstractions from other facilities); face-to-face time discussing  his blood glucose readings/logs, discussing hypoglycemia and hyperglycemia episodes and symptoms, medications doses, his options of short  and long term treatment based on the latest standards of care / guidelines;  discussion about incorporating lifestyle medicine;  and documenting the encounter. Risk reduction counseling performed per USPSTF guidelines to reduce obesity and cardiovascular risk factors.     Please refer to Patient Instructions for Blood Glucose Monitoring and Insulin/Medications Dosing Guide"  in media tab for additional information. Please  also refer to " Patient Self Inventory" in the Media  tab for reviewed elements of pertinent patient history.  Travis [name] participated in the discussions, expressed understanding, and voiced agreement with the above plans.  All questions were answered to his satisfaction. he is encouraged to contact clinic should he have any questions or concerns prior to his return visit.  Follow up plan: - Return in about 4 months (around 04/01/2022) for Diabetes F/U with A1c in office, No previsit labs, Bring meter and logs.    [name], [facility] [address] [place], [place] [address] Phone: [phone] Fax: [phone]  11/30/2021, 11:57 AM

## 2021-12-01 ENCOUNTER — Other Ambulatory Visit (HOSPITAL_COMMUNITY): Payer: Self-pay

## 2021-12-05 ENCOUNTER — Other Ambulatory Visit (HOSPITAL_COMMUNITY): Payer: Self-pay

## 2021-12-10 ENCOUNTER — Other Ambulatory Visit (HOSPITAL_COMMUNITY): Payer: Self-pay

## 2021-12-11 ENCOUNTER — Other Ambulatory Visit (HOSPITAL_COMMUNITY): Payer: Self-pay

## 2021-12-17 LAB — BASIC METABOLIC PANEL
BUN/Creatinine Ratio: 11 (ref 10–24)
BUN: 11 mg/dL (ref 8–27)
CO2: 24 mmol/L (ref 20–29)
Calcium: 9.2 mg/dL (ref 8.6–10.2)
Chloride: 105 mmol/L (ref 96–106)
Creatinine, Ser: 0.97 mg/dL (ref 0.76–1.27)
Glucose: 94 mg/dL (ref 70–99)
Potassium: 4 mmol/L (ref 3.5–5.2)
Sodium: 142 mmol/L (ref 134–144)
eGFR: 88 mL/min/{1.73_m2} (ref >59)

## 2021-12-17 LAB — LIPID PANEL
Chol/HDL Ratio: 2.4 ratio (ref 0.0–5.0)
Cholesterol, Total: 89 mg/dL — ABNORMAL LOW (ref 100–199)
HDL: 37 mg/dL — ABNORMAL LOW (ref >39)
LDL Chol Calc (NIH): 41 mg/dL (ref 0–99)
Triglycerides: 37 mg/dL (ref 0–149)
VLDL Cholesterol Cal: 11 mg/dL (ref 5–40)

## 2021-12-17 LAB — HEPATIC FUNCTION PANEL
ALT: 6 IU/L (ref 0–44)
AST: 17 IU/L (ref 0–40)
Albumin: 4.1 g/dL (ref 3.9–4.9)
Alkaline Phosphatase: 79 IU/L (ref 44–121)
Bilirubin Total: 0.5 mg/dL (ref 0.0–1.2)
Bilirubin, Direct: 0.17 mg/dL (ref 0.00–0.40)
Total Protein: 6.8 g/dL (ref 6.0–8.5)

## 2021-12-17 LAB — PSA: Prostate Specific Ag, Serum: 1.3 ng/mL (ref 0.0–4.0)

## 2021-12-17 LAB — MICROALBUMIN / CREATININE URINE RATIO
Creatinine, Urine: 153 mg/dL
Microalb/Creat Ratio: 10 mg/g creat (ref 0–29)
Microalbumin, Urine: 15.4 ug/mL

## 2021-12-22 ENCOUNTER — Ambulatory Visit: Payer: No Typology Code available for payment source | Admitting: Orthopedic Surgery

## 2021-12-26 ENCOUNTER — Other Ambulatory Visit (HOSPITAL_COMMUNITY): Payer: Self-pay

## 2021-12-31 ENCOUNTER — Other Ambulatory Visit (HOSPITAL_COMMUNITY): Payer: Self-pay

## 2022-01-06 ENCOUNTER — Other Ambulatory Visit (HOSPITAL_COMMUNITY): Payer: Self-pay

## 2022-01-07 ENCOUNTER — Other Ambulatory Visit (HOSPITAL_COMMUNITY): Payer: Self-pay

## 2022-01-29 ENCOUNTER — Other Ambulatory Visit (HOSPITAL_COMMUNITY): Payer: Self-pay

## 2022-01-29 ENCOUNTER — Other Ambulatory Visit: Payer: Self-pay | Admitting: Adult Health

## 2022-02-01 ENCOUNTER — Other Ambulatory Visit (HOSPITAL_COMMUNITY): Payer: Self-pay

## 2022-02-01 MED ORDER — GABAPENTIN 300 MG PO CAPS
ORAL_CAPSULE | ORAL | 6 refills | Status: DC
  Filled 2022-02-01: qty 120, 30d supply, fill #0

## 2022-02-02 NOTE — Progress Notes (Unsigned)
[facility]  PATIENT: Travis [name] DOB: Oct 10, 1960   REASON FOR VISIT: Follow-up for history of stroke HISTORY FROM: Patient  Chief complaint: No chief complaint on file.    HISTORY OF PRESENT ILLNESS:   Travis [name] is a 61 y.o. Caucasian male with PMHx significant for multiple prior strokes, atrial fibrillation on Eliquis (dx 2017 per [facility]), hx of TBI, HTN, HLD, DM, chronic lumbar spinal stenosis, chronic cervical pain  s/p fusion, hx of chronic LLE DVT, hx of GI bleed 2/2 ischemic colitis 2021, episodic vertigo/dizziness and OSA with CPAP noncompliance/intolerance.  He was initially referred to this office in 2016 evaluated by Dr. Erlinda Hong for dizzy spells and history of prior strokes.  Today, 02/03/2021, Travis [name] returns for 6 months stroke follow-up unaccompanied.  Overall stable from stroke standpoint.  Denies new stroke/TIA symptoms.  Compliant on Eliquis and atorvastatin without side effects.  Blood pressure today 129/79.  Recent A1c 7.1 (down from 8.2).  Routinely followed by PCP and endocrinology.  Remains on gabapentin for chronic nerve pain tolerating without side effects.  He plans on undergoing bilateral shoulder surgery - he is currently waiting to schedule a follow up with Dr. Erlinda Hong ortho to further discuss now that his A1c has been improving  No new concerns at this time       REVIEW OF SYSTEMS: Full 14 system review of systems performed and notable only for those listed, all others are neg:  those listed in HPI     ALLERGIES: Allergies  Allergen Reactions   Lisinopril Cough    Patient/spouse is not aware/familiar with why this is listed as an allergy Not a true allergy but on this list to avoid ACE inhibitors-SA Luking MD    HOME MEDICATIONS: Outpatient Medications Prior to Visit  Medication Sig Dispense Refill   ALPRAZolam (XANAX) 0.25 MG tablet TAKE 1 TABLET BY MOUTH 3 TIMES DAILY AS NEEDED 90 tablet 4   apixaban (ELIQUIS) 5  MG TABS tablet TAKE 1 TABLET BY MOUTH 2 TIMES DAILY. 180 tablet 3   atorvastatin (LIPITOR) 20 MG tablet Take 1 tablet (20 mg total) by mouth daily. 90 tablet 1   Blood Pressure Monitoring (OMRON 3 SERIES BP MONITOR) DEVI USE AS DIRECTED. 1 each 0   Cholecalciferol 50 MCG (2000 UT) CAPS Take 1 capsule (2,000 Units total) by mouth daily with breakfast. 30 each 6   FLUoxetine (PROZAC) 20 MG capsule Take 1 capsule by mouth daily. 90 capsule 1   fluticasone (FLONASE) 50 MCG/ACT nasal spray Place 1 spray into both nostrils daily. 16 g 3   folic acid (FOLVITE) 1 MG tablet Take 1 tablet (1 mg total) by mouth daily. 90 tablet 3   gabapentin (NEURONTIN) 300 MG capsule TAKE 1 CAPSULE BY MOUTH IN THE MORNING, 1 CAPSULE BY MOUTH IN THE AFTERNOON THEN 2 CAPSULES NIGHTLY 120 capsule 6   hyoscyamine (LEVSIN SL) 0.125 MG SL tablet Place 1 tablet (0.125 mg total) under the tongue daily before breakfast. 30 tablet 6   insulin degludec (TRESIBA) 100 UNIT/ML FlexTouch Pen Inject 35 Units into the skin at bedtime. 30 mL 3   Insulin Pen Needle (UNIFINE PENTIPS) 31G X 6 MM MISC USE TO INJECT INSULIN DAILY AS DIRECTED 100 each 1   loratadine (CLARITIN) 10 MG tablet Take 1 tablet (10 mg total) by mouth daily. 90 tablet 2   metFORMIN (GLUCOPHAGE) 500 MG tablet TAKE 1 TABLET BY MOUTH TWO TIMES DAILY WITH A MEAL 180 tablet 1  Multiple Vitamin (MULTIVITAMIN WITH MINERALS) TABS tablet Take 1 tablet daily by mouth.     valACYclovir (VALTREX) 1000 MG tablet Take 1 tablet (1,000 mg total) by mouth daily. 90 tablet 3   No facility-administered medications prior to visit.    PAST MEDICAL HISTORY: Past Medical History:  Diagnosis Date   A-fib ([place]) 02/2016   found on loop recorder   Adhesive capsulitis of left shoulder 08/02/2014   Anxiety    Arthritis    Blood transfusion without reported diagnosis    Chronic headaches    Complication of anesthesia    bleed after last shoulder-had to stay overnight   Depression     Diabetes mellitus    Diabetic peripheral neuropathy ([place]) 03/28/2013   Diverticulosis    Dizziness    Hernia, inguinal    Hyperlipidemia    Hypertension    Impingement syndrome of left shoulder 08/02/2014   Ischemic colitis ([place])    Multi-infarct state 10/14/2014   Neuropathy of lower extremity    Night sweats    every once in a while   Sleep apnea    no CPAP   Stroke ([place]) 02/2015   Syncope and collapse     PAST SURGICAL HISTORY: Past Surgical History:  Procedure Laterality Date   BIOPSY  06/14/2019   Procedure: BIOPSY;  Surgeon: [name], MD;  Location: [facility];  Service: Gastroenterology;;   CERVICAL [place]  2003   1/3 removed for diverticulitis   COLONOSCOPY     COLONOSCOPY WITH PROPOFOL N/A 06/14/2019   Procedure: COLONOSCOPY WITH PROPOFOL;  Surgeon: [name], MD;  Location: [facility];  Service: Gastroenterology;  Laterality: N/A;   EP IMPLANTABLE DEVICE N/A 05/01/2015   Procedure: Loop Recorder Insertion;  Surgeon: [name], MD;  Location: [place] [facility];  Service: Cardiovascular;  Laterality: N/A;   INGUINAL HERNIA REPAIR Bilateral    KNEE ARTHROSCOPY Right    SHOULDER ARTHROSCOPY Right    1   SHOULDER ARTHROSCOPY Left 08/02/2014   Procedure: LEFT SHOULDER SCOPE DEBRIDEMENT/ACROMIOPLASTY;  Surgeon: [name], MD;  Location: [place];  Service: Orthopedics;  Laterality: Left;  ANESTHESIA: GENERAL, PRE/POST OP SCALENE   TEE WITHOUT CARDIOVERSION N/A 03/03/2015   Procedure: TRANSESOPHAGEAL ECHOCARDIOGRAM (TEE);  Surgeon: [name], MD;  Location: [facility];  Service: Cardiology;  Laterality: N/A;   UMBILICAL HERNIA REPAIR     with other hernia repair with mesh   WRIST SURGERY Right    fusion    FAMILY HISTORY: Family History  Problem Relation Age of Onset   Stroke Father    Hyperlipidemia Father    Heart attack Sister 53   Stroke Sister    Dementia Mother    ALS Brother         age 50   Diabetes Maternal Grandfather    Colon cancer Neg Hx    Esophageal cancer Neg Hx    Stomach cancer Neg Hx    Rectal cancer Neg Hx     SOCIAL HISTORY: Social History   Socioeconomic History   Marital status: Married    Spouse name: Not on file   Number of children: 2   Years of education: College   Highest education level: Not on file  Occupational History   Occupation: disabled  Tobacco Use   Smoking status: Former    Packs/day: 1.00    Years: 8.00    Total pack years:  8.00    Types: Cigarettes    Start date: 06/07/1970    Quit date: 05/03/1978    Years since quitting: 43.7   Smokeless tobacco: Former    Types: Chew    Quit date: 05/03/1978  Vaping Use   Vaping Use: Never used  Substance and Sexual Activity   Alcohol use: No    Alcohol/week: 1.0 standard drink of alcohol    Types: 1 Cans of beer per week    Comment: h/o heavy use in the past   Drug use: No   Sexual activity: Yes  Other Topics Concern   Not on file  Social History Narrative   Drinks some coffee, Drink diet sodas and tea   Social Determinants of Health   Financial Resource Strain: Not on file  Food Insecurity: Not on file  Transportation Needs: Not on file  Physical Activity: Not on file  Stress: Not on file  Social Connections: Not on file  Intimate Partner Violence: Not on file     PHYSICAL EXAM  There were no vitals filed for this visit.  There is no height or weight on file to calculate BMI.  General: well developed, well nourished,  pleasant middle-age Caucasian male, seated, in no evident distress Neck: supple with no carotid or supraclavicular bruits Cardiovascular: regular rate and rhythm, no murmurs Vascular:  Normal pulses all extremities Musculoskeletal: Limited ROM BUE 2/2 pain   Neurologic Exam Mental Status: Awake and fully alert. Occasional speech hesitancy. Oriented to place and time. Recent and remote memory intact. Attention span, concentration and fund  of knowledge appropriate. Mood and affect appropriate.  Cranial Nerves: Pupils equal, briskly reactive to light. Extraocular movements full without nystagmus. Visual fields full to confrontation. Hearing intact. Facial sensation intact. Face, tongue, palate moves normally and symmetrically.  Motor: Normal bulk and tone. Normal strength in all tested extremity muscles. Sensory.:  Decreased sensation to light touch left arm distally compared to right side (chronic) Coordination: Rapid alternating movements normal in all extremities except decreased left hand. Finger-to-nose and heel-to-shin performed accurately bilaterally. Gait and Station: Arises from chair without difficulty. Stance is normal. Gait demonstrates normal stride length and balance Reflexes: 1+ and symmetric. Toes downgoing.      ASSESSMENT AND PLAN HIROSHI KRUMMEL is a 61 y.o. male with PMH of HTN, HLD, DM, chronic left DVT, atrial fibrillation (dx loop recorder) on Eliquis, GI bleed 2/2 ischemic colitis 2021 (d/c asa), cervical radiculopathy, OSA with CPAP noncompliance, old lacunar strokes involving b/l cerebrellar and left caudate head as well as possible right frontal cortex. On 02/28/15, right SCA cerebellar infarct with evidence of small PFO.  On 05/02/16, left dorsal medulla lacunar infarct and initiated aspirin in addition to Eliquis although discontinued 2021 in setting of GI bleed.     Hx of multiple stroke -Continue Eliquis and atorvastatin for secondary stroke prevention and history of A. Fib -Continue close PCP follow-up for aggressive stroke risk factor management including HTN with BP goal<130/90, HLD with LDL goal<70 and DM with A1c goal<7.0  Cervical radiculopathy -eval by neurosurg -not a candidate for surgical options due to history of multiple cervical procedures -Continue gabapentin 300/300/600 -MR CERVICAL 01/2020: C3-4 and C4-5 severe bilateral foraminal stenosis, C6-7 mild spinal stenosis and severe  bilateral foraminal stenosis - per report, similar appearance compared to 2016 -EMG/NCV 05/2018: Mild neuropathies left wrist, chronic denervation in left deltoid, left biceps and left tricep muscles which could be seen on left cervical polyradiculopathy (C5, C6 and C7).  Recommend correlation with MRI or CT cervical spine     Follow-up in 1 year or call earlier if needed   CC:  Luking, Elayne Snare, MD    I spent 29 minutes of face-to-face and non-face-to-face time with patient.  This included previsit chart review, lab review, study review, electronic health record documentation, patient education regarding prior history of stroke including etiology, importance of managing stroke risk factors, cervical radiculopathy and bilateral upper extremity pain and further evaluation and answered all other questions to patient satisfaction  Frann Rider, AGNP-BC  [facility] [address] [place], [place] [address]  Phone [phone] Fax [phone] Note: This document was prepared with digital dictation and possible smart phrase technology. Any transcriptional errors that result from this process are unintentional.

## 2022-02-03 ENCOUNTER — Ambulatory Visit: Payer: No Typology Code available for payment source | Admitting: Adult Health

## 2022-02-03 ENCOUNTER — Encounter: Payer: Self-pay | Admitting: Adult Health

## 2022-02-03 ENCOUNTER — Other Ambulatory Visit (HOSPITAL_COMMUNITY): Payer: Self-pay

## 2022-02-03 VITALS — BP 131/83 | HR 75 | Ht 73.0 in | Wt 185.0 lb

## 2022-02-03 DIAGNOSIS — M5412 Radiculopathy, cervical region: Secondary | ICD-10-CM

## 2022-02-03 DIAGNOSIS — Z8673 Personal history of transient ischemic attack (TIA), and cerebral infarction without residual deficits: Secondary | ICD-10-CM

## 2022-02-03 MED ORDER — GABAPENTIN 300 MG PO CAPS
ORAL_CAPSULE | ORAL | 11 refills | Status: DC
Start: 2022-02-03 — End: 2023-02-09
  Filled 2022-02-03 – 2022-03-12 (×2): qty 120, 30d supply, fill #0
  Filled 2022-04-09: qty 120, 30d supply, fill #1
  Filled 2022-05-10: qty 120, 30d supply, fill #2
  Filled 2022-06-07: qty 120, 30d supply, fill #3
  Filled 2022-07-07: qty 120, 30d supply, fill #4
  Filled 2022-08-04: qty 120, 30d supply, fill #5
  Filled 2022-08-28 (×2): qty 120, 30d supply, fill #6
  Filled 2022-09-22 – 2022-09-25 (×2): qty 120, 30d supply, fill #7
  Filled 2022-10-25 – 2022-10-30 (×3): qty 120, 30d supply, fill #8
  Filled 2022-11-26: qty 120, 30d supply, fill #9
  Filled 2022-12-21: qty 120, 30d supply, fill #10
  Filled 2023-01-26: qty 120, 30d supply, fill #11

## 2022-02-03 NOTE — Patient Instructions (Signed)
Your Plan:  Continue gabapentin at current dosage  Continue to follow closely with PCP Dr. Wolfgang Phoenix for secondary stroke prevention measures    Follow up in 1 year or call earlier if needed     Thank you for coming to see [place] at [facility]. I hope we have been able to provide you high quality care today.  You may receive a patient satisfaction survey over the next few weeks. We would appreciate your feedback and comments so that we may continue to improve ourselves and the health of our patients.

## 2022-02-06 ENCOUNTER — Other Ambulatory Visit: Payer: Self-pay | Admitting: Family Medicine

## 2022-02-06 ENCOUNTER — Other Ambulatory Visit (HOSPITAL_COMMUNITY): Payer: Self-pay

## 2022-02-09 MED ORDER — ALPRAZOLAM 0.25 MG PO TABS
ORAL_TABLET | Freq: Three times a day (TID) | ORAL | 4 refills | Status: DC | PRN
Start: 2022-02-09 — End: 2022-07-08
  Filled 2022-02-09: qty 90, 30d supply, fill #0
  Filled 2022-03-12: qty 90, 30d supply, fill #1
  Filled 2022-04-09: qty 90, 30d supply, fill #2
  Filled 2022-05-08: qty 90, 30d supply, fill #3
  Filled 2022-06-07: qty 90, 30d supply, fill #4

## 2022-02-10 ENCOUNTER — Other Ambulatory Visit (HOSPITAL_COMMUNITY): Payer: Self-pay

## 2022-02-11 ENCOUNTER — Other Ambulatory Visit (HOSPITAL_COMMUNITY): Payer: Self-pay

## 2022-02-27 ENCOUNTER — Other Ambulatory Visit (HOSPITAL_COMMUNITY): Payer: Self-pay

## 2022-03-01 ENCOUNTER — Other Ambulatory Visit (HOSPITAL_COMMUNITY): Payer: Self-pay

## 2022-03-12 ENCOUNTER — Other Ambulatory Visit (HOSPITAL_COMMUNITY): Payer: Self-pay

## 2022-03-13 ENCOUNTER — Other Ambulatory Visit (HOSPITAL_COMMUNITY): Payer: Self-pay

## 2022-03-22 ENCOUNTER — Other Ambulatory Visit (HOSPITAL_COMMUNITY): Payer: Self-pay

## 2022-04-01 ENCOUNTER — Encounter: Payer: Self-pay | Admitting: Nurse Practitioner

## 2022-04-01 ENCOUNTER — Ambulatory Visit (INDEPENDENT_AMBULATORY_CARE_PROVIDER_SITE_OTHER): Payer: No Typology Code available for payment source | Admitting: Nurse Practitioner

## 2022-04-01 ENCOUNTER — Other Ambulatory Visit (HOSPITAL_COMMUNITY): Payer: Self-pay

## 2022-04-01 VITALS — BP 135/81 | HR 79 | Ht 73.0 in | Wt 190.0 lb

## 2022-04-01 DIAGNOSIS — E559 Vitamin D deficiency, unspecified: Secondary | ICD-10-CM | POA: Diagnosis not present

## 2022-04-01 DIAGNOSIS — D3502 Benign neoplasm of left adrenal gland: Secondary | ICD-10-CM

## 2022-04-01 DIAGNOSIS — E782 Mixed hyperlipidemia: Secondary | ICD-10-CM | POA: Diagnosis not present

## 2022-04-01 DIAGNOSIS — E1159 Type 2 diabetes mellitus with other circulatory complications: Secondary | ICD-10-CM

## 2022-04-01 LAB — POCT GLYCOSYLATED HEMOGLOBIN (HGB A1C): Hemoglobin A1C: 6.8 % — AB (ref 4.0–5.6)

## 2022-04-01 MED ORDER — UNIFINE PENTIPS 31G X 6 MM MISC
1 refills | Status: AC
  Filled 2022-04-01: qty 100, fill #0

## 2022-04-01 MED ORDER — METFORMIN HCL 500 MG PO TABS
500.0000 mg | ORAL_TABLET | Freq: Two times a day (BID) | ORAL | 1 refills | Status: DC
  Filled 2022-04-01: qty 180, fill #0
  Filled 2022-04-27: qty 180, 90d supply, fill #0
  Filled 2022-07-17: qty 180, 90d supply, fill #1

## 2022-04-01 MED ORDER — INSULIN DEGLUDEC 100 UNIT/ML ~~LOC~~ SOPN
35.0000 [IU] | PEN_INJECTOR | Freq: Every day | SUBCUTANEOUS | 3 refills | Status: DC
  Filled 2022-04-01 – 2022-06-04 (×2): qty 30, 85d supply, fill #0

## 2022-04-01 NOTE — Progress Notes (Signed)
04/01/2022, 11:42 AM              Endocrinology follow-up note    Subjective:    Patient ID: Travis [name], male    DOB: [birth year].  Travis [name] is being seen in follow-up  for management of currently uncontrolled symptomatic diabetes requested by  Kathyrn Drown, MD.   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/02/2014   Anxiety    Arthritis    Blood transfusion without reported diagnosis    Chronic headaches    Complication of anesthesia    bleed after last shoulder-had to stay overnight   Depression    Diabetes mellitus    Diabetic peripheral neuropathy ([place]) 03/28/2013   Diverticulosis    Dizziness    Hernia, inguinal    Hyperlipidemia    Hypertension    Impingement syndrome of left shoulder 08/02/2014   Ischemic colitis ([place])    Multi-infarct state 10/14/2014   Neuropathy of lower extremity    Night sweats    every once in a while   Sleep apnea    no CPAP   Stroke ([place]) 02/2015   Syncope and collapse    Past Surgical History:  Procedure Laterality Date   BIOPSY  06/14/2019   Procedure: BIOPSY;  Surgeon: [name], MD;  Location: [facility];  Service: Gastroenterology;;   CERVICAL [place]  2003   1/3 removed for diverticulitis   COLONOSCOPY     COLONOSCOPY WITH PROPOFOL N/A 06/14/2019   Procedure: COLONOSCOPY WITH PROPOFOL;  Surgeon: [name], MD;  Location: [facility];  Service: Gastroenterology;  Laterality: N/A;   EP IMPLANTABLE DEVICE N/A 05/01/2015   Procedure: Loop Recorder Insertion;  Surgeon: [name], MD;  Location: [facility];  Service: Cardiovascular;  Laterality: N/A;   INGUINAL HERNIA REPAIR Bilateral    KNEE ARTHROSCOPY Right    SHOULDER ARTHROSCOPY Right    1   SHOULDER ARTHROSCOPY Left 08/02/2014   Procedure: LEFT SHOULDER SCOPE  DEBRIDEMENT/ACROMIOPLASTY;  Surgeon: [name], MD;  Location: [facility];  Service: Orthopedics;  Laterality: Left;  ANESTHESIA: GENERAL, PRE/POST OP SCALENE   TEE WITHOUT CARDIOVERSION N/A 03/03/2015   Procedure: TRANSESOPHAGEAL ECHOCARDIOGRAM (TEE);  Surgeon: [name], MD;  [facility];  Service: Cardiology;  Laterality: N/A;   UMBILICAL HERNIA REPAIR     with other hernia repair with mesh   WRIST SURGERY Right    fusion   Social History   Socioeconomic History   Marital status: Married    Spouse name: Not on file   Number of children: 2   Years of education: College   Highest education level: Not on file  Occupational History   Occupation: disabled  Tobacco Use   Smoking status: Former    Packs/day: 1.00    Years: 8.00    Total pack years: 8.00    Types: Cigarettes    Start date: 06/07/1970    Quit date: 05/03/1978    Years since quitting: 43.9   Smokeless tobacco: Former  Types: Sarina Ser    Quit date: 05/03/1978  Vaping Use   Vaping Use: Never used  Substance and Sexual Activity   Alcohol use: No    Alcohol/week: 1.0 standard drink of alcohol    Types: 1 Cans of beer per week    Comment: h/o heavy use in the past   Drug use: No   Sexual activity: Yes  Other Topics Concern   Not on file  Social History Narrative   Drinks some coffee, Drink diet sodas and tea   Social Determinants of Health   Financial Resource Strain: Not on file  Food Insecurity: Not on file  Transportation Needs: Not on file  Physical Activity: Not on file  Stress: Not on file  Social Connections: Not on file   Outpatient Encounter Medications as of 04/01/2022  Medication Sig   ALPRAZolam (XANAX) 0.25 MG tablet TAKE 1 TABLET BY MOUTH 3 TIMES DAILY AS NEEDED   apixaban (ELIQUIS) 5 MG TABS tablet TAKE 1 TABLET BY MOUTH 2 TIMES DAILY.   atorvastatin (LIPITOR) 20 MG tablet Take 1 tablet (20 mg total) by mouth daily.   Blood Pressure Monitoring (OMRON 3  SERIES BP MONITOR) DEVI USE AS DIRECTED.   Cholecalciferol 50 MCG (2000 UT) CAPS Take 1 capsule (2,000 Units total) by mouth daily with breakfast.   FLUoxetine (PROZAC) 20 MG capsule Take 1 capsule by mouth daily.   fluticasone (FLONASE) 50 MCG/ACT nasal spray Place 1 spray into both nostrils daily.   folic acid (FOLVITE) 1 MG tablet Take 1 tablet (1 mg total) by mouth daily.   gabapentin (NEURONTIN) 300 MG capsule Take 1 capsule (300 mg total) by mouth every morning AND 1 capsule (300 mg total) daily after lunch AND 2 capsules (600 mg total) at bedtime.   hyoscyamine (LEVSIN SL) 0.125 MG SL tablet Place 1 tablet (0.125 mg total) under the tongue daily before breakfast.   loratadine (CLARITIN) 10 MG tablet Take 1 tablet (10 mg total) by mouth daily.   Multiple Vitamin (MULTIVITAMIN WITH MINERALS) TABS tablet Take 1 tablet daily by mouth.   valACYclovir (VALTREX) 1000 MG tablet Take 1 tablet (1,000 mg total) by mouth daily.   [DISCONTINUED] insulin degludec (TRESIBA) 100 UNIT/ML FlexTouch Pen Inject 35 Units into the skin at bedtime.   [DISCONTINUED] Insulin Pen Needle (UNIFINE PENTIPS) 31G X 6 MM MISC USE TO INJECT INSULIN DAILY AS DIRECTED   [DISCONTINUED] metFORMIN (GLUCOPHAGE) 500 MG tablet TAKE 1 TABLET BY MOUTH TWO TIMES DAILY WITH A MEAL   [EXPIRED] Accu-Chek FastClix Lancets MISC USE TO CHECK BLOOD SUGAR 2 TIMES A DAY   insulin degludec (TRESIBA) 100 UNIT/ML FlexTouch Pen Inject 35 Units into the skin at bedtime.   Insulin Pen Needle (UNIFINE PENTIPS) 31G X 6 MM MISC USE TO INJECT INSULIN DAILY AS DIRECTED   metFORMIN (GLUCOPHAGE) 500 MG tablet TAKE 1 TABLET BY MOUTH TWO TIMES DAILY WITH A MEAL   [DISCONTINUED] ALPRAZolam (XANAX) 0.25 MG tablet TAKE 1 TABLET BY MOUTH 3 TIMES DAILY AS NEEDED   [DISCONTINUED] gabapentin (NEURONTIN) 300 MG capsule TAKE 1 CAPSULE BY MOUTH IN THE MORNING, 1 CAPSULE BY MOUTH IN THE AFTERNOON THEN 2 CAPSULES NIGHTLY   [DISCONTINUED] Insulin Glargine (BASAGLAR  KWIKPEN) 100 UNIT/ML INJECT 50 UNITS INTO THE SKIN AT BEDTIME   No facility-administered encounter medications on file as of 04/01/2022.    ALLERGIES: Allergies  Allergen Reactions   Lisinopril Cough    Patient/spouse is not aware/familiar with why this is listed as an  allergy Not a true allergy but on this list to avoid ACE inhibitors-SA Luking MD    VACCINATION STATUS: Immunization History  Administered Date(s) Administered   Influenza Split 03/28/2013   Influenza,inj,Quad PF,6+ Mos 02/18/2014, 01/23/2015, 02/06/2016, 02/23/2017, 02/24/2018, 01/31/2020, 02/18/2021   Influenza-Unspecified 01/02/2011, 02/09/2019   Moderna Sars-Covid-2 Vaccination 07/21/2019, 08/22/2019, 05/01/2020   PNEUMOCOCCAL CONJUGATE-20 10/01/2021   Pneumococcal Polysaccharide-23 01/31/2010, 03/01/2015   Tdap 02/21/2003, 02/18/2014   Zoster Recombinat (Shingrix) 04/19/2019, 06/29/2019    Diabetes He presents for his follow-up diabetic visit. He has type 2 diabetes mellitus. Onset time: He was diagnosed at approximate age of 60 years. His disease course has been improving (He has prior history of heavy alcohol use/abuse.). Hypoglycemia symptoms include nervousness/anxiousness, sweats and tremors. Pertinent negatives for hypoglycemia include no confusion, headaches, pallor or seizures. Associated symptoms include foot paresthesias. Pertinent negatives for diabetes include no chest pain, no fatigue, no polydipsia, no polyphagia, no polyuria and no weakness. There are no hypoglycemic complications. Symptoms are stable. Diabetic complications include a CVA, nephropathy and peripheral neuropathy. Risk factors for coronary artery disease include dyslipidemia, diabetes mellitus, hypertension, family history, male sex, tobacco exposure and sedentary lifestyle. Current diabetic treatment includes insulin injections and oral agent (monotherapy). He is compliant with treatment most of the time. His weight is stable. He is  following a generally unhealthy diet. When asked about meal planning, he reported none. He has had a previous visit with a dietitian. He participates in exercise intermittently. His home blood glucose trend is decreasing steadily. His overall blood glucose range is 90-110 mg/dl. (He presents today with his meter showing inconsistent glucose monitoring but at goal glycemic profile overall.  His POCT A1c today is 6.8%, improving from last visit of 7.5%.  He did report rare, mild hypoglycemia associated with skipping a meal.  Analysis of his meter shows 7-day average of 97 with 2 readings; 14-day average of 106 with 5 readings; 30-day average of 97 with 12 readings.) An ACE inhibitor/angiotensin II receptor blocker is not being taken. He does not see a podiatrist.Eye exam is current.  Hyperlipidemia This is a chronic problem. The current episode started more than 1 year ago. The problem is controlled. Recent lipid tests were reviewed and are normal. Exacerbating diseases include chronic renal disease and diabetes. Factors aggravating his hyperlipidemia include smoking. Pertinent negatives include no chest pain, myalgias or shortness of breath. Current antihyperlipidemic treatment includes statins. The current treatment provides moderate improvement of lipids. There are no compliance problems.  Risk factors for coronary artery disease include dyslipidemia, diabetes mellitus, hypertension, male sex, family history and a sedentary lifestyle.  Hypertension This is a chronic problem. The current episode started more than 1 year ago. The problem has been resolved since onset. The problem is controlled. Associated symptoms include sweats. Pertinent negatives include no chest pain, headaches, neck pain, palpitations or shortness of breath. There are no associated agents to hypertension. Risk factors for coronary artery disease include dyslipidemia, diabetes mellitus, family history, male gender, sedentary lifestyle and  smoking/tobacco exposure. Past treatments include nothing. Compliance problems include diet.  Hypertensive end-organ damage includes kidney disease, CAD/MI and CVA. Identifiable causes of hypertension include chronic renal disease and sleep apnea.    Review of systems  Constitutional: + Minimally fluctuating body weight,  current Body mass index is 25.07 kg/m. , no fatigue, no subjective hyperthermia, no subjective hypothermia Eyes: no blurry vision, no xerophthalmia ENT: no sore throat, no nodules palpated in throat, no dysphagia/odynophagia, no hoarseness Cardiovascular: no chest  pain, no shortness of breath, no palpitations, no leg swelling Respiratory: no cough, no shortness of breath Gastrointestinal: no nausea/vomiting/diarrhea Musculoskeletal: + bilateral shoulder pain (chronic) Skin: no rashes, no hyperemia Neurological: no tremors, no numbness, no tingling, no dizziness Psychiatric: no depression, no anxiety  Objective:    BP 135/81 (BP Location: Left Arm, Patient Position: Sitting, Cuff Size: Large)   Pulse 79   Ht '6\' 1"'$  (1.854 m)   Wt 190 lb (86.2 kg)   BMI 25.07 kg/m   Wt Readings from Last 3 Encounters:  04/01/22 190 lb (86.2 kg)  02/03/22 185 lb (83.9 kg)  11/30/21 190 lb (86.2 kg)    BP Readings from Last 3 Encounters:  04/01/22 135/81  02/03/22 131/83  11/30/21 (!) 154/78     Physical Exam- Limited  Constitutional:  Body mass index is 25.07 kg/m. , not in acute distress, + inattentive, hyperactive state of mind with rapid/pressured speech Eyes:  EOMI, no exophthalmos Neck: Supple Cardiovascular: RRR, no murmurs, rubs, or gallops, no edema Respiratory: Adequate breathing efforts, no crackles, rales, rhonchi, or wheezing Musculoskeletal: no gross deformities, strength intact in all four extremities, no gross restriction of joint movements Skin:  no rashes, no hyperemia- see foot exam below Neurological: no tremor with outstretched hands   Diabetic Foot  Exam - Simple   No data filed      CMP     Component Value Date/Time   NA 142 12/16/2021 1139   K 4.0 12/16/2021 1139   CL 105 12/16/2021 1139   CO2 24 12/16/2021 1139   GLUCOSE 94 12/16/2021 1139   GLUCOSE 141 (H) 05/12/2021 1520   BUN 11 12/16/2021 1139   CREATININE 0.97 12/16/2021 1139   CREATININE 0.82 02/16/2014 1053   CALCIUM 9.2 12/16/2021 1139   PROT 6.8 12/16/2021 1139   ALBUMIN 4.1 12/16/2021 1139   AST 17 12/16/2021 1139   ALT 6 12/16/2021 1139   ALKPHOS 79 12/16/2021 1139   BILITOT 0.5 12/16/2021 1139   GFRNONAA >60 03/31/2021 1807   GFRAA 92 05/30/2020 1156     Diabetic Labs (most recent): Lab Results  Component Value Date   HGBA1C 6.8 (A) 04/01/2022   HGBA1C 7.5 11/30/2021   HGBA1C 7.7 (A) 07/28/2021   MICROALBUR 5.7 12/07/2019   MICROALBUR 0.8 02/16/2014     Lipid Panel ( most recent) Lipid Panel     Component Value Date/Time   CHOL 89 (L) 12/16/2021 1139   TRIG 37 12/16/2021 1139   HDL 37 (L) 12/16/2021 1139   CHOLHDL 2.4 12/16/2021 1139   CHOLHDL 3.2 05/03/2016 0545   VLDL 16 05/03/2016 0545   LDLCALC 41 12/16/2021 1139      Lab Results  Component Value Date   TSH 1.390 07/24/2021   TSH 2.120 12/07/2019   TSH 2.12 12/07/2019   TSH 1.960 03/12/2017   TSH 1.460 10/07/2016   TSH 0.720 05/02/2016   TSH 1.895 10/14/2014   FREET4 1.25 07/24/2021   FREET4 1.19 12/07/2019   FREET4 1.21 10/07/2016      Assessment & Plan:   1) DM type 2 causing vascular disease ([place])  - Travis [name] has currently uncontrolled symptomatic type 2 DM since  61 years of age.  He presents today with his meter showing inconsistent glucose monitoring but at goal glycemic profile overall.  His POCT A1c today is 6.8%, improving from last visit of 7.5%.  He did report rare, mild hypoglycemia associated with skipping a meal.  Analysis of his meter  shows 7-day average of 97 with 2 readings; 14-day average of 106 with 5 readings; 30-day average of 97 with 12  readings.  -his diabetes is complicated by CVA, history of smoking, history of heavy alcohol use/abuse in the past and MAC DOWDELL remains at a high risk for more acute and chronic complications which include CAD, CVA, CKD, retinopathy, and neuropathy. These are all discussed in detail with the patient.  - Nutritional counseling repeated at each appointment due to patients tendency to fall back in to old habits.  - The patient admits there is a room for improvement in their diet and drink choices. -  Suggestion is made for the patient to avoid simple carbohydrates from their diet including Cakes, Sweet Desserts / Pastries, Ice Cream, Soda (diet and regular), Sweet Tea, Candies, Chips, Cookies, Sweet Pastries, Store Bought Juices, Alcohol in Excess of 1-2 drinks a day, Artificial Sweeteners, Coffee Creamer, and "Sugar-free" Products. This will help patient to have stable blood glucose profile and potentially avoid unintended weight gain.   - I encouraged the patient to switch to unprocessed or minimally processed complex starch and increased protein intake (animal or plant source), fruits, and vegetables.   - Patient is advised to stick to a routine mealtimes to eat 3 meals a day and avoid unnecessary snacks (to snack only to correct hypoglycemia).  - I have approached him with the following individualized plan to manage diabetes and patient agrees:   - There could be a component of pancreatic endocrine and exocrine insufficiency given his prior history of heavy alcohol use.  Therefore, he may need multiple daily injections of insulin to control diabetes in the near future.  -#1 priority in this patient is to avoid hypoglycemia.    -Given his stable, at goal glycemic profile, he is advised to continue Tresiba 35 units SQ nightly and Metformin 500 mg po twice daily with meals.   -He is advised to monitor blood glucose twice daily, before breakfast and before bed, and call the clinic if he has  readings less than 70 or greater than 200 for 3 tests in a row.  -Patient is not a candidate for SGLT2 inhibitors, nor incretin therapy due to his body habitus.  - Patient specific target  A1c;  LDL, HDL, Triglycerides, and  Waist Circumference were discussed in detail.  2) BP/HTN:   His blood pressure is controlled to target.  He is not on any antihypertensive medications at this time.  He will be considered for low dose ARB on subsequent visits if BP becomes elevated over 140/90.  He does monitor BP at home and reports normal readings.  3) Lipids/HPL:  His most recent lipid panel from 12/16/21 shows controlled LDL at 41.  He is advised to continue Lipitor 20 mg po daily at bedtime.  Side effects and precautions discussed with him.    4) Left adrenal adenoma- During a past hospitalization for GI bleed from ischemic colitis a CT scan showed incidental finding of 2.1 cm left adrenal adenoma.  Subsequent plasma metanephrines were within normal limits, considered nonfunctional.  He will not need intervention for it at this time. His repeat 24-hr urine metanephrines, catecholamines, cortisol, and aldosterone were all still normal favoring benignity.    5)  Weight/Diet: His Body mass index is 25.07 kg/m.  He is not a weight loss candidate.  CDE Consult  is in progress , exercise, and detailed carbohydrates information provided.  If he experiences unintentional weight loss, he will be  considered for Creon therapy.   6) Chronic Care/Health Maintenance: -he is on Statin medications and  is encouraged to continue to follow up with Ophthalmology, Dentist,  Podiatrist at least yearly or according to recommendations, and advised to  stay away from smoking. I have recommended yearly flu vaccine and pneumonia vaccination at least every 5 years; moderate intensity exercise for up to 150 minutes weekly; and  sleep for at least 7 hours a day.  - I advised patient to maintain close follow up with Kathyrn Drown,  MD for primary care needs.     I spent 30 minutes in the care of the patient today including review of labs from [facility], Lipids, Thyroid Function, Hematology (current and previous including abstractions from other facilities); face-to-face time discussing  his blood glucose readings/logs, discussing hypoglycemia and hyperglycemia episodes and symptoms, medications doses, his options of short and long term treatment based on the latest standards of care / guidelines;  discussion about incorporating lifestyle medicine;  and documenting the encounter. Risk reduction counseling performed per USPSTF guidelines to reduce obesity and cardiovascular risk factors.     Please refer to Patient Instructions for Blood Glucose Monitoring and Insulin/Medications Dosing Guide"  in media tab for additional information. Please  also refer to " Patient Self Inventory" in the Media  tab for reviewed elements of pertinent patient history.  Travis [name] participated in the discussions, expressed understanding, and voiced agreement with the above plans.  All questions were answered to his satisfaction. he is encouraged to contact clinic should he have any questions or concerns prior to his return visit.  Follow up plan: - Return in about 4 months (around 07/31/2022) for Diabetes F/U with A1c in office, No previsit labs, Bring meter and logs.    [name], [facility] [address] [place], [place] [address] Phone: [phone] Fax: [phone]  04/01/2022, 11:42 AM

## 2022-04-09 ENCOUNTER — Other Ambulatory Visit: Payer: Self-pay | Admitting: Family Medicine

## 2022-04-09 ENCOUNTER — Other Ambulatory Visit (HOSPITAL_COMMUNITY): Payer: Self-pay

## 2022-04-09 MED ORDER — FLUTICASONE PROPIONATE 50 MCG/ACT NA SUSP
1.0000 | Freq: Every day | NASAL | 3 refills | Status: DC
  Filled 2022-04-09: qty 16, 60d supply, fill #0
  Filled 2022-07-03: qty 16, 60d supply, fill #1
  Filled 2022-12-21: qty 16, 60d supply, fill #2
  Filled 2023-02-14 (×2): qty 16, 60d supply, fill #3

## 2022-04-27 ENCOUNTER — Other Ambulatory Visit: Payer: Self-pay

## 2022-04-27 ENCOUNTER — Other Ambulatory Visit (HOSPITAL_COMMUNITY): Payer: Self-pay

## 2022-05-05 ENCOUNTER — Telehealth: Payer: Self-pay

## 2022-05-05 NOTE — Telephone Encounter (Signed)
Caller name: HULEN MANDLER  On DPR?: Yes  Call back number: [phone] (mobile)  Provider they see: Kathyrn Drown, MD  Reason for call: FMLA forms to be completed placed in [name] folder up from

## 2022-05-07 ENCOUNTER — Other Ambulatory Visit: Payer: Self-pay

## 2022-05-07 MED ORDER — COMIRNATY 30 MCG/0.3ML IM SUSY
PREFILLED_SYRINGE | INTRAMUSCULAR | 0 refills | Status: DC
  Filled 2022-05-07: qty 0.3, 1d supply, fill #0

## 2022-05-07 NOTE — Telephone Encounter (Signed)
We will work on these with the patient comes in for his follow-up visit in a few days

## 2022-05-08 ENCOUNTER — Other Ambulatory Visit (HOSPITAL_COMMUNITY): Payer: Self-pay

## 2022-05-10 ENCOUNTER — Other Ambulatory Visit: Payer: Self-pay

## 2022-05-11 ENCOUNTER — Ambulatory Visit (INDEPENDENT_AMBULATORY_CARE_PROVIDER_SITE_OTHER): Payer: 59 | Admitting: Family Medicine

## 2022-05-11 ENCOUNTER — Other Ambulatory Visit (HOSPITAL_COMMUNITY): Payer: Self-pay

## 2022-05-11 ENCOUNTER — Encounter: Payer: Self-pay | Admitting: Family Medicine

## 2022-05-11 ENCOUNTER — Other Ambulatory Visit: Payer: Self-pay

## 2022-05-11 VITALS — BP 138/82 | Wt 191.4 lb

## 2022-05-11 DIAGNOSIS — G8929 Other chronic pain: Secondary | ICD-10-CM | POA: Diagnosis not present

## 2022-05-11 DIAGNOSIS — M25511 Pain in right shoulder: Secondary | ICD-10-CM

## 2022-05-11 DIAGNOSIS — I10 Essential (primary) hypertension: Secondary | ICD-10-CM

## 2022-05-11 DIAGNOSIS — M25512 Pain in left shoulder: Secondary | ICD-10-CM

## 2022-05-11 MED ORDER — VALACYCLOVIR HCL 1 G PO TABS
1000.0000 mg | ORAL_TABLET | Freq: Every day | ORAL | 3 refills | Status: DC
  Filled 2022-05-11 – 2022-06-25 (×2): qty 90, 90d supply, fill #0
  Filled 2022-09-02: qty 90, 90d supply, fill #1

## 2022-05-11 MED ORDER — ATORVASTATIN CALCIUM 20 MG PO TABS
20.0000 mg | ORAL_TABLET | Freq: Every day | ORAL | 1 refills | Status: DC
  Filled 2022-05-11: qty 90, 90d supply, fill #0
  Filled 2022-08-27: qty 90, 90d supply, fill #1

## 2022-05-11 MED ORDER — FLUOXETINE HCL 20 MG PO CAPS
20.0000 mg | ORAL_CAPSULE | Freq: Every day | ORAL | 1 refills | Status: DC
  Filled 2022-05-11 – 2022-07-17 (×2): qty 90, 90d supply, fill #0

## 2022-05-11 NOTE — Progress Notes (Signed)
   Subjective:    Patient ID: Travis [name], male    DOB: 1961-01-07, 62 y.o.   MRN: [id number]  HPI Patient arrives for worsening shoulder pain. Pt reports pain in both shoulders. Going on for about 2 years. Has not seen ortho at this time. Has seen [facility] (across from [facility])- pt states he will not go back there. Pt has 2 detached tendons on both arm. Left arm hurts worse than right. Pt having issues with left middle finger also. Having a hard time falling asleep  and staying asleep.   Patient also has FMLA papers.   Should pt be taking an aspirin daily?  Review of Systems     Objective:   Physical Exam General-in no acute distress Eyes-no discharge Lungs-respiratory rate normal, CTA CV-no murmurs,RRR Extremities skin warm dry no edema Neuro grossly normal Behavior normal, alert   We will fill out FMLA form for his wife because he has to take time off to take him to the doctor.  Visits     Assessment & Plan:  1. Essential hypertension HTN- patient seen for follow-up regarding HTN.   Diet, medication compliance, appropriate labs and refills were completed.   Importance of keeping blood pressure under good control to lessen the risk of complications discussed Regular follow-up visits discussed   2. Chronic pain of both shoulders Recommend referral back to orthopedics.  Warning signs discussed in detail with patient. Follow-up if progressive troubles or worse  Sees endocrinology for his diabetes

## 2022-05-12 NOTE — Addendum Note (Signed)
Addended by: Vicente Males on: 05/12/2022 08:58 AM   Modules accepted: Orders

## 2022-05-12 NOTE — Progress Notes (Signed)
05/12/22-Referral to Dr.Xu placed

## 2022-05-18 ENCOUNTER — Other Ambulatory Visit (HOSPITAL_BASED_OUTPATIENT_CLINIC_OR_DEPARTMENT_OTHER): Payer: Self-pay

## 2022-05-18 MED ORDER — AREXVY 120 MCG/0.5ML IM SUSR
INTRAMUSCULAR | 0 refills | Status: DC
  Filled 2022-05-18: qty 1, 1d supply, fill #0

## 2022-05-19 ENCOUNTER — Other Ambulatory Visit (HOSPITAL_BASED_OUTPATIENT_CLINIC_OR_DEPARTMENT_OTHER): Payer: Self-pay

## 2022-05-31 ENCOUNTER — Ambulatory Visit: Payer: No Typology Code available for payment source | Admitting: Family Medicine

## 2022-06-01 ENCOUNTER — Ambulatory Visit: Payer: 59 | Admitting: Orthopaedic Surgery

## 2022-06-01 ENCOUNTER — Ambulatory Visit (INDEPENDENT_AMBULATORY_CARE_PROVIDER_SITE_OTHER): Payer: 59

## 2022-06-01 ENCOUNTER — Other Ambulatory Visit (HOSPITAL_COMMUNITY): Payer: Self-pay

## 2022-06-01 DIAGNOSIS — M25511 Pain in right shoulder: Secondary | ICD-10-CM

## 2022-06-01 DIAGNOSIS — M25512 Pain in left shoulder: Secondary | ICD-10-CM | POA: Diagnosis not present

## 2022-06-01 MED ORDER — DIAZEPAM 2 MG PO TABS
ORAL_TABLET | ORAL | 0 refills | Status: DC
  Filled 2022-06-01: qty 2, 1d supply, fill #0

## 2022-06-01 NOTE — Progress Notes (Signed)
Office Visit Note   Patient: Travis [name]           Date of Birth: 10-Dec-1960           MRN: [id number] Visit Date: 06/01/2022              Requested by: Kathyrn Drown, MD [address],  [place] [address] PCP: Kathyrn Drown, MD   Assessment & Plan: Visit Diagnoses:  1. Right shoulder pain, unspecified chronicity   2. Left shoulder pain, unspecified chronicity     Plan: Impression is chronic bilateral shoulder pain concerning for rotator cuff pathology.  At this point, patient has tried a guided home exercise program, cortisone injections, oral anti-inflammatories without significant relief.  I would like to go ahead and order MRIs of both shoulders.  He will follow-up with [place] once completed.  Follow-Up Instructions: Return for f/u after MRI.   Orders:  Orders Placed This Encounter  Procedures   XR Shoulder Right   XR Shoulder Left   Meds ordered this encounter  Medications   diazepam (VALIUM) 2 MG tablet    Sig: Take one tab po one hour prior to mri and then repeat just mri to mri if needed    Dispense:  2 tablet    Refill:  0      Procedures: No procedures performed   Clinical Data: No additional findings.   Subjective: Chief Complaint  Patient presents with   Right Shoulder - Pain   Left Shoulder - Pain    HPI patient is a pleasant 62 year old gentleman who comes in today with bilateral shoulder pain left greater than right.  Symptoms have been ongoing for years and have progressively worsened.  The pain he has is to the entire aspect of both shoulders with bilateral arm weakness.  Symptoms are constant but worse with certain movements of the shoulders to include external rotation.  He does not take any medication for this.  He has tried cortisone injections without relief.  He has tried a guided home exercise program for longer than 6 weeks which did not help.  He does note numbness and tingling to his fingers but has a history of  diabetic neuropathy.  Of note, he underwent left shoulder arthroscopic debridement by Dr. Mardelle Matte in 2016.  His most recent hemoglobin A1c is 6.8 and that was from 04/01/2022.  He is on Eliquis for A-fib and previous CVA.  Review of Systems as detailed in HPI.  All others reviewed and are negative.   Objective: Vital Signs: There were no vitals taken for this visit.  Physical Exam well-developed well-nourished gentleman in no acute distress.  Alert and oriented x 3.  Ortho Exam bilateral shoulder exam reveals forward flexion to approximately 150 degrees.  Internal rotation to his back pocket.  External rotation is very limited.  3 out of 5 strength throughout.  He is neurovascular intact distally.  Specialty Comments:  No specialty comments available.  Imaging: XR Shoulder Right  Result Date: 06/01/2022 X-rays demonstrate moderate degenerative changes to the before meals and glenohumeral joints  XR Shoulder Left  Result Date: 06/01/2022 X-rays demonstrate moderate degenerative changes to the before meals and glenohumeral joints    PMFS History: Patient Active Problem List   Diagnosis Date Noted   Right groin hernia 03/03/2021   Hyperlipidemia associated with type 2 diabetes mellitus ([place]) 12/25/2020   Tendinopathy of right rotator cuff 09/25/2020   Arthrosis of right acromioclavicular joint 09/25/2020  Adrenal adenoma, left 09/07/2019   Chronic anticoagulation    GI bleed 06/14/2019   Vitamin D deficiency 04/18/2019   History of stroke 04/20/2018   GAD (generalized anxiety disorder) 05/15/2016   Acute bronchiolitis due to respiratory syncytial virus (RSV)    Acute CVA (cerebrovascular accident) ([place]) 05/04/2016   Dizziness 05/02/2016   Anxiety and depression 05/02/2016   Paroxysmal atrial fibrillation ([place]) 03/02/2016   PFO (patent foramen ovale) 07/24/2015   OSA (obstructive sleep apnea) 04/18/2015   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 artery ([place])  04/18/2015   Cerebellar stroke ([place]) 02/28/2015   Snoring 01/23/2015   Degeneration of cervical intervertebral disc 01/23/2015   Essential hypertension 12/14/2014   DM type 2 causing vascular disease ([place]) 12/14/2014   Neck pain 12/14/2014   Cerebral infarction, chronic    Multi-infarct state 10/14/2014   Impingement syndrome of left shoulder 08/02/2014   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/02/2014   Rectal bleeding 03/13/2014   Diabetes type 2, controlled ([place]) 02/18/2014   Diabetic peripheral neuropathy ([place]) 03/28/2013   Irreducible incisional hernia 11/11/2010   Past Medical History:  Diagnosis Date   A-fib ([place]) 02/2016   found on loop recorder   Adhesive capsulitis of left shoulder 08/02/2014   Anxiety    Arthritis    Blood transfusion without reported diagnosis    Chronic headaches    Complication of anesthesia    bleed after last shoulder-had to stay overnight   Depression    Diabetes mellitus    Diabetic peripheral neuropathy ([place]) 03/28/2013   Diverticulosis    Dizziness    Hernia, inguinal    Hyperlipidemia    Hypertension    Impingement syndrome of left shoulder 08/02/2014   Ischemic colitis ([place])    Multi-infarct state 10/14/2014   Neuropathy of lower extremity    Night sweats    every once in a while   Sleep apnea    no CPAP   Stroke ([place]) 02/2015   Syncope and collapse     Family History  Problem Relation Age of Onset   Stroke Father    Hyperlipidemia Father    Heart attack Sister 63   Stroke Sister    Dementia Mother    ALS Brother        age 76   Diabetes Maternal Grandfather    Colon cancer Neg Hx    Esophageal cancer Neg Hx    Stomach cancer Neg Hx    Rectal cancer Neg Hx     Past Surgical History:  Procedure Laterality Date   BIOPSY  06/14/2019   Procedure: BIOPSY;  Surgeon: [name], MD;  [facility];  Service: Gastroenterology;;   CERVICAL [place]  2003   1/3 removed for diverticulitis    COLONOSCOPY     COLONOSCOPY WITH PROPOFOL N/A 06/14/2019   Procedure: COLONOSCOPY WITH PROPOFOL;  Surgeon: [name], MD;  [facility];  Service: Gastroenterology;  Laterality: N/A;   EP IMPLANTABLE DEVICE N/A 05/01/2015   Procedure: Loop Recorder Insertion;  Surgeon: [name], MD;  Location: [facility];  Service: Cardiovascular;  Laterality: N/A;   INGUINAL HERNIA REPAIR Bilateral    KNEE ARTHROSCOPY Right    SHOULDER ARTHROSCOPY Right    1   SHOULDER ARTHROSCOPY Left 08/02/2014   Procedure: LEFT SHOULDER SCOPE DEBRIDEMENT/ACROMIOPLASTY;  Surgeon: [name], MD;  Location: [facility];  Service: Orthopedics;  Laterality:  Left;  ANESTHESIA: GENERAL, PRE/POST OP SCALENE   TEE WITHOUT CARDIOVERSION N/A 03/03/2015   Procedure: TRANSESOPHAGEAL ECHOCARDIOGRAM (TEE);  Surgeon: [name], MD;  [facility];  Service: Cardiology;  Laterality: N/A;   UMBILICAL HERNIA REPAIR     with other hernia repair with mesh   WRIST SURGERY Right    fusion   Social History   Occupational History   Occupation: disabled  Tobacco Use   Smoking status: Former    Packs/day: 1.00    Years: 8.00    Total pack years: 8.00    Types: Cigarettes    Start date: 06/07/1970    Quit date: 05/03/1978    Years since quitting: 44.1   Smokeless tobacco: Former    Types: Chew    Quit date: 05/03/1978  Vaping Use   Vaping Use: Never used  Substance and Sexual Activity   Alcohol use: No    Alcohol/week: 1.0 standard drink of alcohol    Types: 1 Cans of beer per week    Comment: h/o heavy use in the past   Drug use: No   Sexual activity: Yes

## 2022-06-04 ENCOUNTER — Other Ambulatory Visit: Payer: Self-pay

## 2022-06-04 ENCOUNTER — Other Ambulatory Visit (HOSPITAL_COMMUNITY): Payer: Self-pay

## 2022-06-07 ENCOUNTER — Other Ambulatory Visit: Payer: Self-pay

## 2022-06-08 ENCOUNTER — Other Ambulatory Visit: Payer: Self-pay

## 2022-06-08 ENCOUNTER — Other Ambulatory Visit (HOSPITAL_COMMUNITY): Payer: Self-pay

## 2022-06-09 ENCOUNTER — Other Ambulatory Visit (HOSPITAL_COMMUNITY): Payer: Self-pay

## 2022-06-10 ENCOUNTER — Encounter (HOSPITAL_COMMUNITY): Payer: Self-pay | Admitting: *Deleted

## 2022-06-24 ENCOUNTER — Ambulatory Visit
Admission: RE | Admit: 2022-06-24 | Discharge: 2022-06-24 | Disposition: A | Discharge status: Home / Self Care | Payer: 59 | Source: Ambulatory Visit | Attending: Orthopaedic Surgery | Admitting: Orthopaedic Surgery

## 2022-06-24 DIAGNOSIS — M25511 Pain in right shoulder: Secondary | ICD-10-CM

## 2022-06-24 DIAGNOSIS — S46012A Strain of muscle(s) and tendon(s) of the rotator cuff of left shoulder, initial encounter: Secondary | ICD-10-CM | POA: Diagnosis not present

## 2022-06-24 DIAGNOSIS — M25512 Pain in left shoulder: Secondary | ICD-10-CM

## 2022-06-24 DIAGNOSIS — S46011A Strain of muscle(s) and tendon(s) of the rotator cuff of right shoulder, initial encounter: Secondary | ICD-10-CM | POA: Diagnosis not present

## 2022-06-25 ENCOUNTER — Other Ambulatory Visit: Payer: Self-pay | Admitting: "Endocrinology

## 2022-06-25 ENCOUNTER — Other Ambulatory Visit: Payer: Self-pay

## 2022-06-25 ENCOUNTER — Telehealth: Payer: Self-pay | Admitting: Orthopaedic Surgery

## 2022-06-25 ENCOUNTER — Other Ambulatory Visit: Payer: Self-pay | Admitting: Nurse Practitioner

## 2022-06-25 ENCOUNTER — Other Ambulatory Visit (HOSPITAL_COMMUNITY): Payer: Self-pay

## 2022-06-25 DIAGNOSIS — E1159 Type 2 diabetes mellitus with other circulatory complications: Secondary | ICD-10-CM

## 2022-06-25 MED ORDER — ACCU-CHEK GUIDE VI STRP
ORAL_STRIP | 2 refills | Status: AC
  Filled 2022-06-25: qty 100, 50d supply, fill #0

## 2022-06-25 MED ORDER — ACCU-CHEK FASTCLIX LANCETS MISC
2 refills | Status: AC
  Filled 2022-06-25: qty 200, 90d supply, fill #0
  Filled 2022-06-29: qty 204, 90d supply, fill #0

## 2022-06-25 NOTE — Telephone Encounter (Signed)
Called patient left message to return call to schedule an MRI review with Dr. Erlinda Hong after 06/24/2022

## 2022-06-26 ENCOUNTER — Other Ambulatory Visit (HOSPITAL_COMMUNITY): Payer: Self-pay

## 2022-06-28 ENCOUNTER — Other Ambulatory Visit: Payer: Self-pay

## 2022-06-28 ENCOUNTER — Other Ambulatory Visit (HOSPITAL_BASED_OUTPATIENT_CLINIC_OR_DEPARTMENT_OTHER): Payer: Self-pay

## 2022-06-28 ENCOUNTER — Other Ambulatory Visit (HOSPITAL_COMMUNITY): Payer: Self-pay

## 2022-06-28 MED ORDER — COMFORT EZ PEN NEEDLES 31G X 6 MM MISC
2 refills | Status: DC
  Filled 2022-06-28: qty 100, fill #0
  Filled 2022-09-22: qty 100, 90d supply, fill #0
  Filled 2022-12-21: qty 100, 90d supply, fill #1
  Filled 2023-04-08: qty 100, 90d supply, fill #2

## 2022-06-28 NOTE — Progress Notes (Signed)
Needs f/u thx

## 2022-06-29 ENCOUNTER — Other Ambulatory Visit (HOSPITAL_COMMUNITY): Payer: Self-pay

## 2022-06-29 ENCOUNTER — Other Ambulatory Visit: Payer: Self-pay

## 2022-06-29 ENCOUNTER — Telehealth: Payer: Self-pay | Admitting: Nurse Practitioner

## 2022-06-29 DIAGNOSIS — E1159 Type 2 diabetes mellitus with other circulatory complications: Secondary | ICD-10-CM

## 2022-06-29 MED ORDER — FREESTYLE LITE W/DEVICE KIT
PACK | 0 refills | Status: DC
  Filled 2022-06-29: qty 1, 30d supply, fill #0

## 2022-06-29 NOTE — Telephone Encounter (Signed)
Rx sent to [facility].

## 2022-06-29 NOTE — Telephone Encounter (Signed)
Pharmacy stated need new prescription for the [facility] Lite Meter.  Same one that was called in 2022. [place]

## 2022-07-01 ENCOUNTER — Encounter: Payer: Self-pay | Admitting: Radiology

## 2022-07-03 ENCOUNTER — Other Ambulatory Visit (HOSPITAL_COMMUNITY): Payer: Self-pay

## 2022-07-05 ENCOUNTER — Other Ambulatory Visit: Payer: Self-pay

## 2022-07-07 ENCOUNTER — Encounter: Payer: Self-pay | Admitting: Orthopaedic Surgery

## 2022-07-07 ENCOUNTER — Ambulatory Visit: Payer: 59 | Admitting: Orthopaedic Surgery

## 2022-07-07 DIAGNOSIS — M75112 Incomplete rotator cuff tear or rupture of left shoulder, not specified as traumatic: Secondary | ICD-10-CM | POA: Diagnosis not present

## 2022-07-07 DIAGNOSIS — M19012 Primary osteoarthritis, left shoulder: Secondary | ICD-10-CM | POA: Diagnosis not present

## 2022-07-07 DIAGNOSIS — M7542 Impingement syndrome of left shoulder: Secondary | ICD-10-CM

## 2022-07-07 NOTE — Progress Notes (Signed)
Office Visit Note   Patient: Travis [name]           Date of Birth: [birth year]           MRN: [id number] Visit Date: 07/07/2022              Requested by: Kathyrn Drown, MD [address],  [place] [address] PCP: Kathyrn Drown, MD   Assessment & Plan: Visit Diagnoses:  1. Impingement syndrome of left shoulder   2. Nontraumatic incomplete tear of left rotator cuff   3. Arthritis of left acromioclavicular joint     Plan: MRI scans of both shoulders were reviewed independently and interpreted and discussed with the patient.  Left shoulder MRI shows severe tendinopathy of the supraspinatus with a full-thickness tear.  He is biceps tendinopathy and AC joint arthritis.  MRI of the right shoulder shows a severe tendinopathy of the supraspinatus and infraspinatus.  Degenerative changes of the [facility] joint and biceps tendon.  Based on these findings I recommended arthroscopic left shoulder surgery with rotator cuff repair as well as extensive debridement with likely biceps tenotomy, subacromial decompression and distal clavicle excision.  Questions encouraged and answered.  We will obtain preoperative surgical clearance from Dr. Wolfgang [place].  He will need to be off Eliquis for 3 days for the surgery.  Jackelyn Poling will call the patient to schedule surgery once we have the clearance.  Follow-Up Instructions: No follow-ups on file.   Orders:  No orders of the defined types were placed in this encounter.  No orders of the defined types were placed in this encounter.     Procedures: No procedures performed   Clinical Data: No additional findings.   Subjective: Chief Complaint  Patient presents with   Left Shoulder - Follow-up    MRI review   Right Shoulder - Follow-up    MRI review    HPI  Nathin returns today to discuss bilateral shoulder MRI scans.  The left shoulder is more symptomatic.  Review of Systems  Constitutional: Negative.   HENT: Negative.    Eyes:  Negative.   Respiratory: Negative.    Cardiovascular: Negative.   Gastrointestinal: Negative.   Endocrine: Negative.   Genitourinary: Negative.   Skin: Negative.   Allergic/Immunologic: Negative.   Neurological: Negative.   Hematological: Negative.   Psychiatric/Behavioral: Negative.    All other systems reviewed and are negative.    Objective: Vital Signs: There were no vitals taken for this visit.  Physical Exam Vitals and nursing note reviewed.  Constitutional:      Appearance: He is well-developed.  Pulmonary:     Effort: Pulmonary effort is normal.  Abdominal:     Palpations: Abdomen is soft.  Skin:    General: Skin is warm.  Neurological:     Mental Status: He is alert and oriented to person, place, and time.  Psychiatric:        Behavior: Behavior normal.        Thought Content: Thought content normal.        Judgment: Judgment normal.     Ortho Exam  Examination of the shoulders is unchanged.  Specialty Comments:  No specialty comments available.  Imaging: No results found.   PMFS History: Patient Active Problem List   Diagnosis Date Noted   Nontraumatic incomplete tear of left rotator cuff 07/07/2022   Arthritis of left acromioclavicular joint 07/07/2022   Right groin hernia 03/03/2021   Hyperlipidemia associated with type 2 diabetes  mellitus ([place]) 12/25/2020   Tendinopathy of right rotator cuff 09/25/2020   Arthrosis of right acromioclavicular joint 09/25/2020   Adrenal adenoma, left 09/07/2019   Chronic anticoagulation    GI bleed 06/14/2019   Vitamin D deficiency 04/18/2019   History of stroke 04/20/2018   GAD (generalized anxiety disorder) 05/15/2016   Acute bronchiolitis due to respiratory syncytial virus (RSV)    Acute CVA (cerebrovascular accident) ([place]) 05/04/2016   Dizziness 05/02/2016   Anxiety and depression 05/02/2016   Paroxysmal atrial fibrillation ([place]) 03/02/2016   PFO (patent foramen ovale) 07/24/2015   OSA (obstructive  sleep apnea) 04/18/2015   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 artery ([place]) 04/18/2015   Cerebellar stroke ([place]) 02/28/2015   Snoring 01/23/2015   Degeneration of cervical intervertebral disc 01/23/2015   Essential hypertension 12/14/2014   DM type 2 causing vascular disease ([place]) 12/14/2014   Neck pain 12/14/2014   Cerebral infarction, chronic    Multi-infarct state 10/14/2014   Impingement syndrome of left shoulder 08/02/2014   Adhesive capsulitis of left shoulder 08/02/2014   Rectal bleeding 03/13/2014   Diabetes type 2, controlled ([place]) 02/18/2014   Diabetic peripheral neuropathy ([place]) 03/28/2013   Irreducible incisional hernia 11/11/2010   Past Medical History:  Diagnosis Date   A-fib ([place]) 02/2016   found on loop recorder   Adhesive capsulitis of left shoulder 08/02/2014   Anxiety    Arthritis    Blood transfusion without reported diagnosis    Chronic headaches    Complication of anesthesia    bleed after last shoulder-had to stay overnight   Depression    Diabetes mellitus    Diabetic peripheral neuropathy ([place]) 03/28/2013   Diverticulosis    Dizziness    Hernia, inguinal    Hyperlipidemia    Hypertension    Impingement syndrome of left shoulder 08/02/2014   Ischemic colitis ([place])    Multi-infarct state 10/14/2014   Neuropathy of lower extremity    Night sweats    every once in a while   Sleep apnea    no CPAP   Stroke ([place]) 02/2015   Syncope and collapse     Family History  Problem Relation Age of Onset   Stroke Father    Hyperlipidemia Father    Heart attack Sister 5   Stroke Sister    Dementia Mother    ALS Brother        age 7   Diabetes Maternal Grandfather    Colon cancer Neg Hx    Esophageal cancer Neg Hx    Stomach cancer Neg Hx    Rectal cancer Neg Hx     Past Surgical History:  Procedure Laterality Date   BIOPSY  06/14/2019   Procedure: BIOPSY;  Surgeon: [name], MD;  [facility];  Service:  Gastroenterology;;   CERVICAL [place]  2003   1/3 removed for diverticulitis   COLONOSCOPY     COLONOSCOPY WITH PROPOFOL N/A 06/14/2019   Procedure: COLONOSCOPY WITH PROPOFOL;  Surgeon: [name], MD;  [facility];  Service: Gastroenterology;  Laterality: N/A;   EP IMPLANTABLE DEVICE N/A 05/01/2015   Procedure: Loop Recorder Insertion;  Surgeon: [name], MD;  Location: [facility];  Service: Cardiovascular;  Laterality: N/A;   INGUINAL HERNIA REPAIR Bilateral    KNEE ARTHROSCOPY Right    SHOULDER ARTHROSCOPY Right    1   SHOULDER ARTHROSCOPY Left 08/02/2014  Procedure: LEFT SHOULDER SCOPE DEBRIDEMENT/ACROMIOPLASTY;  Surgeon: [name], MD;  Location: [facility];  Service: Orthopedics;  Laterality: Left;  ANESTHESIA: GENERAL, PRE/POST OP SCALENE   TEE WITHOUT CARDIOVERSION N/A 03/03/2015   Procedure: TRANSESOPHAGEAL ECHOCARDIOGRAM (TEE);  Surgeon: [name], MD;  [facility];  Service: Cardiology;  Laterality: N/A;   UMBILICAL HERNIA REPAIR     with other hernia repair with mesh   WRIST SURGERY Right    fusion   Social History   Occupational History   Occupation: disabled  Tobacco Use   Smoking status: Former    Packs/day: 1.00    Years: 8.00    Total pack years: 8.00    Types: Cigarettes    Start date: 06/07/1970    Quit date: 05/03/1978    Years since quitting: 44.2   Smokeless tobacco: Former    Types: Chew    Quit date: 05/03/1978  Vaping Use   Vaping Use: Never used  Substance and Sexual Activity   Alcohol use: No    Alcohol/week: 1.0 standard drink of alcohol    Types: 1 Cans of beer per week    Comment: h/o heavy use in the past   Drug use: No   Sexual activity: Yes

## 2022-07-08 ENCOUNTER — Other Ambulatory Visit (HOSPITAL_COMMUNITY): Payer: Self-pay

## 2022-07-08 ENCOUNTER — Other Ambulatory Visit: Payer: Self-pay | Admitting: Family Medicine

## 2022-07-09 ENCOUNTER — Other Ambulatory Visit (HOSPITAL_COMMUNITY): Payer: Self-pay

## 2022-07-09 ENCOUNTER — Other Ambulatory Visit: Payer: Self-pay

## 2022-07-09 MED ORDER — ALPRAZOLAM 0.25 MG PO TABS
0.2500 mg | ORAL_TABLET | Freq: Three times a day (TID) | ORAL | 3 refills | Status: DC | PRN
  Filled 2022-07-09: qty 90, 30d supply, fill #0
  Filled 2022-08-05: qty 90, 30d supply, fill #1
  Filled 2022-09-02 (×2): qty 90, 30d supply, fill #2
  Filled 2022-09-30: qty 90, 30d supply, fill #3

## 2022-07-10 ENCOUNTER — Other Ambulatory Visit: Payer: Self-pay

## 2022-07-13 ENCOUNTER — Other Ambulatory Visit: Payer: Self-pay

## 2022-07-17 ENCOUNTER — Other Ambulatory Visit (HOSPITAL_COMMUNITY): Payer: Self-pay

## 2022-07-19 ENCOUNTER — Encounter (HOSPITAL_COMMUNITY): Payer: Self-pay | Admitting: Physician Assistant

## 2022-07-19 ENCOUNTER — Ambulatory Visit (HOSPITAL_COMMUNITY)
Admission: RE | Admit: 2022-07-19 | Discharge: 2022-07-19 | Disposition: A | Discharge status: Home / Self Care | Payer: 59 | Source: Ambulatory Visit | Attending: Physician Assistant | Admitting: Physician Assistant

## 2022-07-19 ENCOUNTER — Other Ambulatory Visit (HOSPITAL_COMMUNITY): Payer: Self-pay

## 2022-07-19 ENCOUNTER — Encounter (HOSPITAL_COMMUNITY): Payer: Self-pay

## 2022-07-19 VITALS — BP 120/70 | HR 80 | Ht 73.0 in | Wt 184.6 lb

## 2022-07-19 DIAGNOSIS — Z7984 Long term (current) use of oral hypoglycemic drugs: Secondary | ICD-10-CM | POA: Diagnosis not present

## 2022-07-19 DIAGNOSIS — I10 Essential (primary) hypertension: Secondary | ICD-10-CM | POA: Diagnosis not present

## 2022-07-19 DIAGNOSIS — Z8673 Personal history of transient ischemic attack (TIA), and cerebral infarction without residual deficits: Secondary | ICD-10-CM | POA: Diagnosis not present

## 2022-07-19 DIAGNOSIS — Z87891 Personal history of nicotine dependence: Secondary | ICD-10-CM | POA: Insufficient documentation

## 2022-07-19 DIAGNOSIS — Z794 Long term (current) use of insulin: Secondary | ICD-10-CM | POA: Diagnosis not present

## 2022-07-19 DIAGNOSIS — I480 Paroxysmal atrial fibrillation: Secondary | ICD-10-CM | POA: Diagnosis not present

## 2022-07-19 DIAGNOSIS — Z7901 Long term (current) use of anticoagulants: Secondary | ICD-10-CM | POA: Insufficient documentation

## 2022-07-19 DIAGNOSIS — D6869 Other thrombophilia: Secondary | ICD-10-CM | POA: Diagnosis not present

## 2022-07-19 MED ORDER — APIXABAN 5 MG PO TABS
5.0000 mg | ORAL_TABLET | Freq: Two times a day (BID) | ORAL | 3 refills | Status: DC
  Filled 2022-07-19: qty 180, 90d supply, fill #0
  Filled 2022-10-13: qty 180, 90d supply, fill #1
  Filled 2023-01-11: qty 180, 90d supply, fill #2
  Filled 2023-04-08: qty 180, 90d supply, fill #3

## 2022-07-19 NOTE — Progress Notes (Signed)
Primary Care Physician: Kathyrn Drown, MD Referring Physician: Dr. Andee Poles is a 62 y.o. male with a h/o CVA, s/p linq with transient afib noted that was seen by me in 2017 for start of eliquis. I have not seen him since then. He is here today to get refills on his eliquis. He has SR on his EKG and has not noted any irregular HR's. Review of his Paceart  device checks  have shown absence of afib. He is doing well. No bleeding issues. Last cmet  In December showed normal creatinine at 1.03.  F/u in afib clinic, 07/12/19. He is here for f/u afib  s/p Linq insertion after stroke 2017. As far as he is aware, he has had no further afib. He was in the hospital in February with a GI bleed. He was off anticoagulation for a few days  but has resumed. ASA was stopped. No reason for the bleed was identified. No further bleeding. CHA2DS2VASc score is at least 4.  His Linq battery has since expired but he prefers to leave in place for now.  F/u in afib clinic, 07-20-2020. Linq battery is now dead. We discussed if he wanted to have removed, for now, he will continue to let it be.  He reports that he is not noted any afib. Had cmet 3/11 with PCP, but not a CBC, will draw today with ongoing eliquis use, CHA2DS2VASc of at least 4. No bleed issues.   F/u July 20, 2021. He reports no awareness of afib. He feels good. No complaints. Ekg shows SR.  CHA2DS2VASc  of at least 4. Continues on eliquis 5 mg bid   F/u 07/19/22. He is doing well overall and denies any episodes of Afib over the past year. Continues on eliquis 5 mg bid for CHA2DS2VASc of at least 4. No bleeding concerns.  Today, he denies symptoms of palpitations, chest pain, shortness of breath, orthopnea, PND, lower extremity edema, dizziness, presyncope, syncope, or neurologic sequela. The patient is tolerating medications without difficulties and is otherwise without complaint today.   Past Medical History:  Diagnosis Date   A-fib ([place]) 02/2016    found on loop recorder   Adhesive capsulitis of left shoulder 08/02/2014   Anxiety    Arthritis    Blood transfusion without reported diagnosis    Chronic headaches    Complication of anesthesia    bleed after last shoulder-had to stay overnight   Depression    Diabetes mellitus    Diabetic peripheral neuropathy ([place]) 03/28/2013   Diverticulosis    Dizziness    Hernia, inguinal    Hyperlipidemia    Hypertension    Impingement syndrome of left shoulder 08/02/2014   Ischemic colitis ([place])    Multi-infarct state 10/14/2014   Neuropathy of lower extremity    Night sweats    every once in a while   Sleep apnea    no CPAP   Stroke ([place]) 02/2015   Syncope and collapse    Past Surgical History:  Procedure Laterality Date   BIOPSY  06/14/2019   Procedure: BIOPSY;  Surgeon: [name], MD;  Location: [facility];  Service: Gastroenterology;;   CERVICAL [place]  2003   1/3 removed for diverticulitis   COLONOSCOPY     COLONOSCOPY WITH PROPOFOL N/A 06/14/2019   Procedure: COLONOSCOPY WITH PROPOFOL;  Surgeon: [name], MD;  Location: [facility];  Service: Gastroenterology;  Laterality:  N/A;   EP IMPLANTABLE DEVICE N/A 05/01/2015   Procedure: Loop Recorder Insertion;  Surgeon: [name], MD;  Location: [facility];  Service: Cardiovascular;  Laterality: N/A;   INGUINAL HERNIA REPAIR Bilateral    KNEE ARTHROSCOPY Right    SHOULDER ARTHROSCOPY Right    1   SHOULDER ARTHROSCOPY Left 08/02/2014   Procedure: LEFT SHOULDER SCOPE DEBRIDEMENT/ACROMIOPLASTY;  Surgeon: [name], MD;  Location: [facility];  Service: Orthopedics;  Laterality: Left;  ANESTHESIA: GENERAL, PRE/POST OP SCALENE   TEE WITHOUT CARDIOVERSION N/A 03/03/2015   Procedure: TRANSESOPHAGEAL ECHOCARDIOGRAM (TEE);  Surgeon: [name], MD;  Location: [facility];  Service: Cardiology;  Laterality: N/A;   UMBILICAL HERNIA REPAIR     with other  hernia repair with mesh   WRIST SURGERY Right    fusion    Current Outpatient Medications  Medication Sig Dispense Refill   Accu-Chek FastClix Lancets MISC Use to check blood sugar twice daily. 204 each 2   ALPRAZolam (XANAX) 0.25 MG tablet Take 1 tablet (0.25 mg total) by mouth 3 (three) times daily as needed. 90 tablet 3   apixaban (ELIQUIS) 5 MG TABS tablet TAKE 1 TABLET BY MOUTH 2 TIMES DAILY. 180 tablet 3   atorvastatin (LIPITOR) 20 MG tablet Take 1 tablet (20 mg total) by mouth daily. 90 tablet 1   Blood Glucose Monitoring Suppl (FREESTYLE LITE) w/Device KIT Use to check glucose twice daily 1 kit 0   Blood Pressure Monitoring (OMRON 3 SERIES BP MONITOR) DEVI USE AS DIRECTED. 1 each 0   Cholecalciferol 50 MCG (2000 UT) CAPS Take 1 capsule (2,000 Units total) by mouth daily with breakfast. 30 each 6   COVID-19 mRNA vaccine 2023-2024 (COMIRNATY) syringe Inject into the muscle. 0.3 mL 0   FLUoxetine (PROZAC) 20 MG capsule Take 1 capsule by mouth daily. 90 capsule 1   fluticasone (FLONASE) 50 MCG/ACT nasal spray Place 1 spray into both nostrils daily. 16 g 3   folic acid (FOLVITE) 1 MG tablet Take 1 tablet (1 mg total) by mouth daily. 90 tablet 3   gabapentin (NEURONTIN) 300 MG capsule Take 1 capsule (300 mg total) by mouth every morning AND 1 capsule (300 mg total) daily after lunch AND 2 capsules (600 mg total) at bedtime. 120 capsule 11   glucose blood (ACCU-CHEK GUIDE) test strip USE AS DIRECTED TO TEST BLOOD SUGAR 2 TIMES DAILY AS DIRECTED 200 strip 2   hyoscyamine (LEVSIN SL) 0.125 MG SL tablet Place 1 tablet (0.125 mg total) under the tongue daily before breakfast. 30 tablet 6   insulin degludec (TRESIBA) 100 UNIT/ML FlexTouch Pen Inject 35 Units into the skin at bedtime. 30 mL 3   Insulin Pen Needle (COMFORT EZ PEN NEEDLES) 31G X 6 MM MISC USE TO INJECT INSULIN DAILY AS DIRECTED 100 each 2   Insulin Pen Needle (UNIFINE PENTIPS) 31G X 6 MM MISC USE TO INJECT INSULIN DAILY AS DIRECTED  100 each 1   metFORMIN (GLUCOPHAGE) 500 MG tablet Take 1 tablet (500 mg total) by mouth 2 (two) times daily with a meal. 180 tablet 1   Multiple Vitamin (MULTIVITAMIN WITH MINERALS) TABS tablet Take 1 tablet daily by mouth.     RSV vaccine recomb adjuvanted (AREXVY) 120 MCG/0.5ML injection Inject into the muscle. 1 each 0   valACYclovir (VALTREX) 1000 MG tablet Take 1 tablet by mouth daily. 90 tablet 3   No current facility-administered medications for this encounter.    Allergies  Allergen Reactions   Lisinopril Cough    Patient/spouse is not aware/familiar with why this is listed as an allergy Not a true allergy but on this list to avoid ACE inhibitors-SA Luking MD    Social History   Socioeconomic History   Marital status: Married    Spouse name: Not on file   Number of children: 2   Years of education: College   Highest education level: Not on file  Occupational History   Occupation: disabled  Tobacco Use   Smoking status: Former    Packs/day: 1.00    Years: 8.00    Additional pack years: 0.00    Total pack years: 8.00    Types: Cigarettes    Start date: 06/07/1970    Quit date: 05/03/1978    Years since quitting: 44.2   Smokeless tobacco: Former    Types: Chew    Quit date: 05/03/1978   Tobacco comments:    Former smoker 07/19/22  Vaping Use   Vaping Use: Never used  Substance and Sexual Activity   Alcohol use: Yes    Alcohol/week: 1.0 standard drink of alcohol    Types: 1 Cans of beer per week    Comment: 1 beer every 6 months h/o heavy use in the past 07/19/22   Drug use: No   Sexual activity: Yes  Other Topics Concern   Not on file  Social History Narrative   Drinks some coffee, Drink diet sodas and tea   Social Determinants of Health   Financial Resource Strain: Not on file  Food Insecurity: Not on file  Transportation Needs: Not on file  Physical Activity: Not on file  Stress: Not on file  Social Connections: Not on file  Intimate Partner Violence:  Not on file    Family History  Problem Relation Age of Onset   Stroke Father    Hyperlipidemia Father    Heart attack Sister 51   Stroke Sister    Dementia Mother    ALS Brother        age 74   Diabetes Maternal Grandfather    Colon cancer Neg Hx    Esophageal cancer Neg Hx    Stomach cancer Neg Hx    Rectal cancer Neg Hx     ROS- All systems are reviewed and negative except as per the HPI above  Physical Exam: Vitals:   07/19/22 1352  BP: 120/70  Pulse: 80  Weight: 83.7 kg  Height: 6\' 1"  (1.854 m)   Wt Readings from Last 3 Encounters:  07/19/22 83.7 kg  05/11/22 86.8 kg  04/01/22 86.2 kg    Labs: Lab Results  Component Value Date   NA 142 12/16/2021   K 4.0 12/16/2021   CL 105 12/16/2021   CO2 24 12/16/2021   GLUCOSE 94 12/16/2021   BUN 11 12/16/2021   CREATININE 0.97 12/16/2021   CALCIUM 9.2 12/16/2021   PHOS 3.2 06/13/2019   MG 2.0 06/13/2019   Lab Results  Component Value Date   INR 1.15 05/02/2016   Lab Results  Component Value Date   CHOL 89 (L) 12/16/2021   HDL 37 (L) 12/16/2021   LDLCALC 41 12/16/2021   TRIG 37 12/16/2021     GEN- The patient is well appearing, alert and oriented x 3 today.   Head- normocephalic, atraumatic Eyes-  Sclera clear, conjunctiva pink Ears- hearing intact Oropharynx- clear Neck- supple, no JVP Lymph- no cervical lymphadenopathy Lungs- Clear to ausculation bilaterally, normal work of breathing  Heart- Regular rate and rhythm, no murmurs, rubs or gallops, PMI not laterally displaced GI- soft, NT, ND, + BS Extremities- no clubbing, cyanosis, or edema MS- no significant deformity or atrophy Skin- no rash or lesion Psych- euthymic mood, full affect Neuro- strength and sensation are intact  EKG-NSR at 80 bpm, PR int 152 ms, qrs int 90 ms, qtc 410 ms Epic records reviewed  Epic records reviewed   Assessment and Plan: 1. Paroxysmal afib Seen on LInq after stroke 2017 Now with expired battery, does not  want replaced or removed at this point  He is in [place] today and doing well overall.  We will continue with conservative observation at this time.   2. Secondary hypercoagulable state.  Continue eliquis 5 mg bid for a CHA2DS2VASc score of at least 4  Refill for eliquis sent; no bleeding concerns today.  3. HTN Stable, no changes.  F/u in one year  Adline Peals, PA-C [facility] [address] [place], [place] [address] [phone]

## 2022-07-20 ENCOUNTER — Other Ambulatory Visit (HOSPITAL_COMMUNITY): Payer: Self-pay

## 2022-07-25 ENCOUNTER — Telehealth: Payer: Self-pay | Admitting: Family Medicine

## 2022-07-25 NOTE — Telephone Encounter (Signed)
Nurses-patient has procedure coming up for left shoulder arthroscopy with general anesthesia by Dr. Erlinda Hong  They have sent a request to come off of his Eliquis.  Before I send them response it is important to connect with the patient.  Please talk with the patient make sure that he is aware that in order for them to do the orthopedic surgery on his shoulder he will have to come off of the Eliquis.  Without coming off Eliquis they cannot do the surgery.  There is a low chance of having a stroke while off of Eliquis.  Let the patient know that I did touch base with his cardiology team and they agree that if the patient does desire to have the surgery done that he has to come off the Eliquis.  Please make sure that the patient approves to come off Eliquis 2 days prior to surgery and typically may resume Eliquis the day after surgery as long as there is not bleeding complications.  We need his consent to this before we can send approval to Dr.Xu.  Please talk with patient.  Document appropriately.  Send it back to me then I can send notification to Dr.Xu.  Thank you-Dr. [name]  Below is communication from cardiology  Fenton, Doloris Hall, PA  Kathyrn Drown, MD Hi Dr Wolfgang Phoenix,  I agree, for this type of procedure holding for 2 days before surgery would be sufficient. Unless they are planning for spinal anesthesia, then 3 day hold prior would be correct. With his history, we would want to try and minimize time off anticoagulation.  Yes, resuming Eliquis the following day is recommended if no bleeding complications.  No role for bridging with him because he hasn't had a recent stroke or h/o mechanical valve.  Thanks for reaching out!  Adline Peals

## 2022-07-26 ENCOUNTER — Encounter: Payer: Self-pay | Admitting: Family Medicine

## 2022-07-26 NOTE — Progress Notes (Signed)
Please send this with form to Dr.Xu office

## 2022-07-26 NOTE — Telephone Encounter (Signed)
Patient notified of provider's recommendations and would like to stop the medication as advised to proceed with surgery and states he is aware of the possible risks

## 2022-07-26 NOTE — Telephone Encounter (Signed)
Please fax form that I filled out as well as letter to Dr.Xu thank you

## 2022-07-30 ENCOUNTER — Other Ambulatory Visit (HOSPITAL_COMMUNITY): Payer: Self-pay

## 2022-08-02 ENCOUNTER — Telehealth: Payer: Self-pay | Admitting: Orthopaedic Surgery

## 2022-08-02 NOTE — Telephone Encounter (Signed)
Called and relayed information to patient's wife. She will call if she has any other questions.

## 2022-08-02 NOTE — Telephone Encounter (Signed)
Patient is scheduled for 09-01-22 at [facility] for left shoulder scope, extensive debridement, distal clavicle excision, rotator cuff repair.  Wife is calling wanting to know what the recovery time is on this surgery.  When will patient be able to drive?  When can patient shower after the surgery.  Call [name] (wife) at [phone].

## 2022-08-04 ENCOUNTER — Ambulatory Visit (INDEPENDENT_AMBULATORY_CARE_PROVIDER_SITE_OTHER): Payer: 59 | Admitting: Nurse Practitioner

## 2022-08-04 ENCOUNTER — Other Ambulatory Visit (HOSPITAL_COMMUNITY): Payer: Self-pay

## 2022-08-04 ENCOUNTER — Other Ambulatory Visit: Payer: Self-pay

## 2022-08-04 ENCOUNTER — Encounter: Payer: Self-pay | Admitting: Nurse Practitioner

## 2022-08-04 VITALS — BP 138/90 | HR 66 | Ht 73.0 in | Wt 189.8 lb

## 2022-08-04 DIAGNOSIS — E1159 Type 2 diabetes mellitus with other circulatory complications: Secondary | ICD-10-CM | POA: Diagnosis not present

## 2022-08-04 DIAGNOSIS — E782 Mixed hyperlipidemia: Secondary | ICD-10-CM

## 2022-08-04 DIAGNOSIS — D3502 Benign neoplasm of left adrenal gland: Secondary | ICD-10-CM

## 2022-08-04 DIAGNOSIS — E559 Vitamin D deficiency, unspecified: Secondary | ICD-10-CM

## 2022-08-04 LAB — POCT GLYCOSYLATED HEMOGLOBIN (HGB A1C): Hemoglobin A1C: 7.3 % — AB (ref 4.0–5.6)

## 2022-08-04 MED ORDER — INSULIN DEGLUDEC 100 UNIT/ML ~~LOC~~ SOPN: 30.0000 [IU] | PEN_INJECTOR | Freq: Every day | SUBCUTANEOUS | 3 refills | Status: DC

## 2022-08-04 MED ORDER — DEXCOM G7 SENSOR MISC
1.0000 | 3 refills | Status: DC
Start: 2022-08-04 — End: 2022-12-07
  Filled 2022-08-04: qty 9, 90d supply, fill #0

## 2022-08-04 NOTE — Progress Notes (Signed)
08/04/2022, 12:52 PM              Endocrinology follow-up note    Subjective:    Patient ID: Travis [name], male    DOB: 11/13/1960.  Travis [name] is being seen in follow-up  for management of currently uncontrolled symptomatic diabetes requested by  Kathyrn Drown, MD.   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   Anxiety    Arthritis    Blood transfusion without reported diagnosis    Chronic headaches    Complication of anesthesia    bleed after last shoulder-had to stay overnight   Depression    Diabetes mellitus    Diabetic peripheral neuropathy 03/28/2013   Diverticulosis    Dizziness    Hernia, inguinal    Hyperlipidemia    Hypertension    Impingement syndrome of left shoulder 08/02/2014   Ischemic colitis    Multi-infarct state 10/14/2014   Neuropathy of lower extremity    Night sweats    every once in a while   Sleep apnea    no CPAP   Stroke 02/2015   Syncope and collapse    Past Surgical History:  Procedure Laterality Date   BIOPSY  06/14/2019   Procedure: BIOPSY;  Surgeon: [name], MD;  Location: [facility];  Service: Gastroenterology;;   CERVICAL [place]  2003   1/3 removed for diverticulitis   COLONOSCOPY     COLONOSCOPY WITH PROPOFOL N/A 06/14/2019   Procedure: COLONOSCOPY WITH PROPOFOL;  Surgeon: [name], MD;  Location: [facility];  Service: Gastroenterology;  Laterality: N/A;   EP IMPLANTABLE DEVICE N/A 05/01/2015   Procedure: Loop Recorder Insertion;  Surgeon: [name], MD;  Location: [facility];  Service: Cardiovascular;  Laterality: N/A;   INGUINAL HERNIA REPAIR Bilateral    KNEE ARTHROSCOPY Right    SHOULDER ARTHROSCOPY Right    1   SHOULDER ARTHROSCOPY Left 08/02/2014   Procedure: LEFT SHOULDER SCOPE DEBRIDEMENT/ACROMIOPLASTY;   Surgeon: [name], MD;  Location: [place] [facility];  Service: Orthopedics;  Laterality: Left;  ANESTHESIA: GENERAL, PRE/POST OP SCALENE   TEE WITHOUT CARDIOVERSION N/A 03/03/2015   Procedure: TRANSESOPHAGEAL ECHOCARDIOGRAM (TEE);  Surgeon: [name], MD;  Location: [facility];  Service: Cardiology;  Laterality: N/A;   UMBILICAL HERNIA REPAIR     with other hernia repair with mesh   WRIST SURGERY Right    fusion   Social History   Socioeconomic History   Marital status: Married    Spouse name: Not on file   Number of children: 2   Years of education: College   Highest education level: Not on file  Occupational History   Occupation: disabled  Tobacco Use   Smoking status: Former    Packs/day: 1.00    Years: 8.00    Additional pack years: 0.00    Total pack years: 8.00    Types: Cigarettes    Start date: 06/07/1970    Quit date: 05/03/1978    Years since quitting: 44.2   Smokeless tobacco:  Former    Types: Chew    Quit date: 05/03/1978   Tobacco comments:    Former smoker 07/19/22  Vaping Use   Vaping Use: Never used  Substance and Sexual Activity   Alcohol use: Yes    Alcohol/week: 1.0 standard drink of alcohol    Types: 1 Cans of beer per week    Comment: 1 beer every 6 months h/o heavy use in the past 07/19/22   Drug use: No   Sexual activity: Yes  Other Topics Concern   Not on file  Social History Narrative   Drinks some coffee, Drink diet sodas and tea   Social Determinants of Health   Financial Resource Strain: Not on file  Food Insecurity: Not on file  Transportation Needs: Not on file  Physical Activity: Not on file  Stress: Not on file  Social Connections: Not on file   Outpatient Encounter Medications as of 08/04/2022  Medication Sig   Accu-Chek FastClix Lancets MISC Use to check blood sugar twice daily.   ALPRAZolam (XANAX) 0.25 MG tablet Take 1 tablet (0.25 mg total) by mouth 3 (three) times daily as needed.   apixaban (ELIQUIS)  5 MG TABS tablet Take 1 tablet (5 mg total) by mouth 2 (two) times daily.   atorvastatin (LIPITOR) 20 MG tablet Take 1 tablet (20 mg total) by mouth daily.   Blood Glucose Monitoring Suppl (FREESTYLE LITE) w/Device KIT Use to check glucose twice daily   Blood Pressure Monitoring (OMRON 3 SERIES BP MONITOR) DEVI USE AS DIRECTED.   Cholecalciferol 50 MCG (2000 UT) CAPS Take 1 capsule (2,000 Units total) by mouth daily with breakfast.   Continuous Blood Gluc Sensor (DEXCOM G7 SENSOR) MISC Inject into the skin as directed to check continuous blood sugar. Change sensor every 10 days as directed.   COVID-19 mRNA vaccine 2023-2024 (COMIRNATY) syringe Inject into the muscle.   FLUoxetine (PROZAC) 20 MG capsule Take 1 capsule by mouth daily.   fluticasone (FLONASE) 50 MCG/ACT nasal spray Place 1 spray into both nostrils daily.   folic acid (FOLVITE) 1 MG tablet Take 1 tablet (1 mg total) by mouth daily.   gabapentin (NEURONTIN) 300 MG capsule Take 1 capsule (300 mg total) by mouth every morning AND 1 capsule (300 mg total) daily after lunch AND 2 capsules (600 mg total) at bedtime.   glucose blood (ACCU-CHEK GUIDE) test strip USE AS DIRECTED TO TEST BLOOD SUGAR 2 TIMES DAILY AS DIRECTED   hyoscyamine (LEVSIN SL) 0.125 MG SL tablet Place 1 tablet (0.125 mg total) under the tongue daily before breakfast.   Insulin Pen Needle (COMFORT EZ PEN NEEDLES) 31G X 6 MM MISC USE TO INJECT INSULIN DAILY AS DIRECTED   Insulin Pen Needle (UNIFINE PENTIPS) 31G X 6 MM MISC USE TO INJECT INSULIN DAILY AS DIRECTED   metFORMIN (GLUCOPHAGE) 500 MG tablet Take 1 tablet (500 mg total) by mouth 2 (two) times daily with a meal.   Multiple Vitamin (MULTIVITAMIN WITH MINERALS) TABS tablet Take 1 tablet daily by mouth.   RSV vaccine recomb adjuvanted (AREXVY) 120 MCG/0.5ML injection Inject into the muscle.   valACYclovir (VALTREX) 1000 MG tablet Take 1 tablet by mouth daily.   [DISCONTINUED] insulin degludec (TRESIBA) 100 UNIT/ML  FlexTouch Pen Inject 35 Units into the skin at bedtime.   insulin degludec (TRESIBA) 100 UNIT/ML FlexTouch Pen Inject 30 Units into the skin at bedtime.   [DISCONTINUED] Insulin Glargine (BASAGLAR KWIKPEN) 100 UNIT/ML INJECT 50 UNITS INTO THE SKIN AT BEDTIME  No facility-administered encounter medications on file as of 08/04/2022.    ALLERGIES: Allergies  Allergen Reactions   Lisinopril Cough    Patient/spouse is not aware/familiar with why this is listed as an allergy Not a true allergy but on this list to avoid ACE inhibitors-SA Luking MD    VACCINATION STATUS: Immunization History  Administered Date(s) Administered   COVID-19, mRNA, vaccine(Comirnaty)12 years and older 05/10/2022   Influenza Split 03/28/2013   Influenza,inj,Quad PF,6+ Mos 02/18/2014, 01/23/2015, 02/06/2016, 02/23/2017, 02/24/2018, 01/31/2020, 02/18/2021   Influenza-Unspecified 01/02/2011, 02/09/2019, 03/17/2022   Moderna Sars-Covid-2 Vaccination 07/21/2019, 08/22/2019, 05/01/2020   PNEUMOCOCCAL CONJUGATE-20 10/01/2021   Pneumococcal Polysaccharide-23 01/31/2010, 03/01/2015   Respiratory Syncytial Virus Vaccine,Recomb Aduvanted(Arexvy) 05/18/2022   Tdap 02/21/2003, 02/18/2014   Zoster Recombinat (Shingrix) 04/19/2019, 06/29/2019    Diabetes He presents for his follow-up diabetic visit. He has type 2 diabetes mellitus. Onset time: He was diagnosed at approximate age of 64 years. His disease course has been stable (He has prior history of heavy alcohol use/abuse.). Hypoglycemia symptoms include nervousness/anxiousness, sweats and tremors. Pertinent negatives for hypoglycemia include no confusion, headaches, pallor or seizures. Associated symptoms include foot paresthesias. Pertinent negatives for diabetes include no chest pain, no fatigue, no polydipsia, no polyphagia, no polyuria and no weakness. There are no hypoglycemic complications. Symptoms are stable. Diabetic complications include a CVA, nephropathy and  peripheral neuropathy. Risk factors for coronary artery disease include dyslipidemia, diabetes mellitus, hypertension, family history, male sex, tobacco exposure and sedentary lifestyle. Current diabetic treatment includes insulin injections and oral agent (monotherapy). He is compliant with treatment most of the time. His weight is fluctuating minimally. He is following a generally unhealthy diet. When asked about meal planning, he reported none. He has had a previous visit with a dietitian. He participates in exercise intermittently. His home blood glucose trend is fluctuating minimally. His overall blood glucose range is 110-130 mg/dl. (He presents today with his meter showing at target glycemic profile overall.  His POCT A1c today is 7.3%, increasing from last visit  of 6.8%.  He has had steroid injections in his shoulders which typically raises his glucose levels temporarily.  Analysis of his meter shows 7-day average of 131, 14-day average of 117, 30-day average of 110.) An ACE inhibitor/angiotensin II receptor blocker is not being taken. He does not see a podiatrist.Eye exam is current.  Hyperlipidemia This is a chronic problem. The current episode started more than 1 year ago. The problem is controlled. Recent lipid tests were reviewed and are normal. Exacerbating diseases include chronic renal disease and diabetes. Factors aggravating his hyperlipidemia include smoking. Pertinent negatives include no chest pain, myalgias or shortness of breath. Current antihyperlipidemic treatment includes statins. The current treatment provides moderate improvement of lipids. There are no compliance problems.  Risk factors for coronary artery disease include dyslipidemia, diabetes mellitus, hypertension, male sex, family history and a sedentary lifestyle.  Hypertension This is a chronic problem. The current episode started more than 1 year ago. The problem has been resolved since onset. The problem is controlled.  Associated symptoms include sweats. Pertinent negatives include no chest pain, headaches, neck pain, palpitations or shortness of breath. There are no associated agents to hypertension. Risk factors for coronary artery disease include dyslipidemia, diabetes mellitus, family history, male gender, sedentary lifestyle and smoking/tobacco exposure. Past treatments include nothing. Compliance problems include diet.  Hypertensive end-organ damage includes kidney disease, CAD/MI and CVA. Identifiable causes of hypertension include chronic renal disease and sleep apnea.    Review of systems  Constitutional: + Minimally fluctuating body weight,  current Body mass index is 25.04 kg/m. , no fatigue, no subjective hyperthermia, no subjective hypothermia Eyes: no blurry vision, no xerophthalmia ENT: no sore throat, no nodules palpated in throat, no dysphagia/odynophagia, no hoarseness Cardiovascular: no chest pain, no shortness of breath, no palpitations, no leg swelling Respiratory: no cough, no shortness of breath Gastrointestinal: no nausea/vomiting/diarrhea Musculoskeletal: + bilateral shoulder pain (chronic) Skin: no rashes, no hyperemia Neurological: no tremors, no numbness, no tingling, no dizziness Psychiatric: no depression, no anxiety  Objective:    BP (!) 138/90 (BP Location: Left Arm, Patient Position: Sitting, Cuff Size: Large) Comment: Recheck - Manuel Cuff  Pulse 66   Ht 6\' 1"  (1.854 m)   Wt 189 lb 12.8 oz (86.1 kg)   BMI 25.04 kg/m   Wt Readings from Last 3 Encounters:  08/04/22 189 lb 12.8 oz (86.1 kg)  07/19/22 184 lb 9.6 oz (83.7 kg)  05/11/22 191 lb 6.4 oz (86.8 kg)    BP Readings from Last 3 Encounters:  08/04/22 (!) 138/90  07/19/22 120/70  05/11/22 138/82     Physical Exam- Limited  Constitutional:  Body mass index is 25.04 kg/m. , not in acute distress, + inattentive, hyperactive state of mind with rapid/pressured speech Eyes:  EOMI, no  exophthalmos Musculoskeletal: no gross deformities, strength intact in all four extremities, no gross restriction of joint movements Skin:  no rashes, no hyperemia- see foot exam below Neurological: no tremor with outstretched hands   Diabetic Foot Exam - Simple   Simple Foot Form Diabetic Foot exam was performed with the following findings: Yes 08/04/2022 11:22 AM  Visual Inspection No deformities, no ulcerations, no other skin breakdown bilaterally: Yes Sensation Testing Intact to touch and monofilament testing bilaterally: Yes Pulse Check Posterior Tibialis and Dorsalis pulse intact bilaterally: Yes Comments      CMP     Component Value Date/Time   NA 142 12/16/2021 1139   K 4.0 12/16/2021 1139   CL 105 12/16/2021 1139   CO2 24 12/16/2021 1139   GLUCOSE 94 12/16/2021 1139   GLUCOSE 141 (H) 05/12/2021 1520   BUN 11 12/16/2021 1139   CREATININE 0.97 12/16/2021 1139   CREATININE 0.82 02/16/2014 1053   CALCIUM 9.2 12/16/2021 1139   PROT 6.8 12/16/2021 1139   ALBUMIN 4.1 12/16/2021 1139   AST 17 12/16/2021 1139   ALT 6 12/16/2021 1139   ALKPHOS 79 12/16/2021 1139   BILITOT 0.5 12/16/2021 1139   GFRNONAA >60 03/31/2021 1807   GFRAA 92 05/30/2020 1156     Diabetic Labs (most recent): Lab Results  Component Value Date   HGBA1C 7.3 (A) 08/04/2022   HGBA1C 6.8 (A) 04/01/2022   HGBA1C 7.5 11/30/2021   MICROALBUR 5.7 12/07/2019   MICROALBUR 0.8 02/16/2014     Lipid Panel ( most recent) Lipid Panel     Component Value Date/Time   CHOL 89 (L) 12/16/2021 1139   TRIG 37 12/16/2021 1139   HDL 37 (L) 12/16/2021 1139   CHOLHDL 2.4 12/16/2021 1139   CHOLHDL 3.2 05/03/2016 0545   VLDL 16 05/03/2016 0545   LDLCALC 41 12/16/2021 1139      Lab Results  Component Value Date   TSH 1.390 07/24/2021   TSH 2.120 12/07/2019   TSH 2.12 12/07/2019   TSH 1.960 03/12/2017   TSH 1.460 10/07/2016   TSH 0.720 05/02/2016   TSH 1.895 10/14/2014   FREET4 1.25 07/24/2021    FREET4 1.19 12/07/2019   FREET4  1.21 10/07/2016      Assessment & Plan:   1) DM type 2 causing vascular disease ([place])  - Travis [name] has currently uncontrolled symptomatic type 2 DM since  62 years of age.  He presents today with his meter showing at target glycemic profile overall.  His POCT A1c today is 7.3%, increasing from last visit  of 6.8%.  He has had steroid injections in his shoulders which typically raises his glucose levels temporarily.  Analysis of his meter shows 7-day average of 131, 14-day average of 117, 30-day average of 110.  -his diabetes is complicated by CVA, history of smoking, history of heavy alcohol use/abuse in the past and Travis [name] remains at a high risk for more acute and chronic complications which include CAD, CVA, CKD, retinopathy, and neuropathy. These are all discussed in detail with the patient.  - Nutritional counseling repeated at each appointment due to patients tendency to fall back in to old habits.  - The patient admits there is a room for improvement in their diet and drink choices. -  Suggestion is made for the patient to avoid simple carbohydrates from their diet including Cakes, Sweet Desserts / Pastries, Ice Cream, Soda (diet and regular), Sweet Tea, Candies, Chips, Cookies, Sweet Pastries, Store Bought Juices, Alcohol in Excess of 1-2 drinks a day, Artificial Sweeteners, Coffee Creamer, and "Sugar-free" Products. This will help patient to have stable blood glucose profile and potentially avoid unintended weight gain.   - I encouraged the patient to switch to unprocessed or minimally processed complex starch and increased protein intake (animal or plant source), fruits, and vegetables.   - Patient is advised to stick to a routine mealtimes to eat 3 meals a day and avoid unnecessary snacks (to snack only to correct hypoglycemia).  - I have approached him with the following individualized plan to manage diabetes and patient agrees:    - There could be a component of pancreatic endocrine and exocrine insufficiency given his prior history of heavy alcohol use.  Therefore, he may need multiple daily injections of insulin to control diabetes in the near future.  -#1 priority in this patient is to avoid hypoglycemia.    -Given his recent hypoglycemia, he is advised to lower his Tresiba to 30 units SQ nightly and continue Metformin 500 mg po twice daily with meals.   -He is advised to monitor blood glucose twice daily, before breakfast and before bed, and call the clinic if he has readings less than 70 or greater than 200 for 3 tests in a row.  He could benefit from CGM device, I sent in script for Dexcom G7 sensors for him.  -Patient is not a candidate for SGLT2 inhibitors, nor incretin therapy due to his body habitus.  - Patient specific target  A1c;  LDL, HDL, Triglycerides, and  Waist Circumference were discussed in detail.  2) BP/HTN:   His blood pressure is controlled to target.  He is not on any antihypertensive medications at this time.  He will be considered for low dose ARB on subsequent visits if BP becomes elevated over 140/90.  He does monitor BP at home and reports normal readings.  3) Lipids/HPL:  His most recent lipid panel from 12/16/21 shows controlled LDL at 41.  He is advised to continue Lipitor 20 mg po daily at bedtime.  Side effects and precautions discussed with him.    4) Left adrenal adenoma- During a past hospitalization for GI bleed from ischemic colitis  a CT scan showed incidental finding of 2.1 cm left adrenal adenoma.  Subsequent plasma metanephrines were within normal limits, considered nonfunctional.  He will not need intervention for it at this time. His repeat 24-hr urine metanephrines, catecholamines, cortisol, and aldosterone were all still normal favoring benignity.    5)  Weight/Diet: His Body mass index is 25.04 kg/m.  He is not a weight loss candidate.  CDE Consult  is in progress ,  exercise, and detailed carbohydrates information provided.  If he experiences unintentional weight loss, he will be considered for Creon therapy.   6) Chronic Care/Health Maintenance: -he is on Statin medications and  is encouraged to continue to follow up with Ophthalmology, Dentist,  Podiatrist at least yearly or according to recommendations, and advised to  stay away from smoking. I have recommended yearly flu vaccine and pneumonia vaccination at least every 5 years; moderate intensity exercise for up to 150 minutes weekly; and  sleep for at least 7 hours a day.  - I advised patient to maintain close follow up with Kathyrn Drown, MD for primary care needs.     I spent  31  minutes in the care of the patient today including review of labs from [place], Lipids, Thyroid Function, Hematology (current and previous including abstractions from other facilities); face-to-face time discussing  his blood glucose readings/logs, discussing hypoglycemia and hyperglycemia episodes and symptoms, medications doses, his options of short and long term treatment based on the latest standards of care / guidelines;  discussion about incorporating lifestyle medicine;  and documenting the encounter. Risk reduction counseling performed per USPSTF guidelines to reduce obesity and cardiovascular risk factors.     Please refer to Patient Instructions for Blood Glucose Monitoring and Insulin/Medications Dosing Guide"  in media tab for additional information. Please  also refer to " Patient Self Inventory" in the Media  tab for reviewed elements of pertinent patient history.  Travis [name] participated in the discussions, expressed understanding, and voiced agreement with the above plans.  All questions were answered to his satisfaction. he is encouraged to contact clinic should he have any questions or concerns prior to his return visit.  Follow up plan: - Return in about 4 months (around 12/04/2022) for Diabetes F/U with  A1c in office, No previsit labs, Bring meter and logs.    [name], [facility] [address] [place], [place] [address] Phone: [phone] Fax: [phone]  08/04/2022, 12:52 PM

## 2022-08-05 ENCOUNTER — Other Ambulatory Visit: Payer: Self-pay

## 2022-08-05 ENCOUNTER — Other Ambulatory Visit (HOSPITAL_COMMUNITY): Payer: Self-pay

## 2022-08-09 ENCOUNTER — Ambulatory Visit: Payer: Self-pay | Admitting: Nurse Practitioner

## 2022-08-18 ENCOUNTER — Ambulatory Visit (INDEPENDENT_AMBULATORY_CARE_PROVIDER_SITE_OTHER): Payer: 59 | Admitting: Family Medicine

## 2022-08-18 ENCOUNTER — Encounter: Payer: Self-pay | Admitting: Family Medicine

## 2022-08-18 ENCOUNTER — Other Ambulatory Visit: Payer: Self-pay

## 2022-08-18 VITALS — BP 128/78 | HR 64 | Ht 73.0 in | Wt 187.0 lb

## 2022-08-18 DIAGNOSIS — W57XXXA Bitten or stung by nonvenomous insect and other nonvenomous arthropods, initial encounter: Secondary | ICD-10-CM | POA: Diagnosis not present

## 2022-08-18 DIAGNOSIS — M65332 Trigger finger, left middle finger: Secondary | ICD-10-CM

## 2022-08-18 DIAGNOSIS — S30860A Insect bite (nonvenomous) of lower back and pelvis, initial encounter: Secondary | ICD-10-CM

## 2022-08-18 NOTE — Progress Notes (Signed)
   Subjective:    Patient ID: Travis [name], male    DOB: 03/06/1961, 62 y.o.   MRN: [id number]  HPI Patient arrives today for tick bite on back. Tick bite on the lower back no fever chills or bodyaches energy level good  Patient also with trigger finger on left hand   Review of Systems     Objective:   Physical Exam Trigger finger on exam small tick bite not infected on the lower back Blood pressure rechecked is improved Lungs clear heart regular Patient does not appear sick      Assessment & Plan:   Tick bite warnings discussed no antibiotics indicated Trigger finger patient encouraged to see his orthopedist Follow-up if progressive troubles

## 2022-08-18 NOTE — Patient Instructions (Signed)
Tick Bite Information, Adult  Ticks are insects that draw blood for food. They climb onto people and animals that brush against the leaves and grasses that they live in. They then bite and attach to the skin. Most ticks are harmless, but some ticks may carry germs that can cause disease. These germs are spread to a person through a bite. To lower your risk of getting a disease from a tick bite, make sure you: Take steps to prevent tick bites. Check for ticks after being outdoors where ticks live. Watch for symptoms of disease if a tick attached to you or if you think a tick bit you. How can I prevent tick bites? Take these steps to help prevent tick bites when you go outdoors in an area where ticks live: Before you go outdoors: Wear long sleeves and long pants to protect your skin from ticks. Wear light-colored clothing so you can see ticks easier. Tuck your pant legs into your socks. Apply insect repellent that has DEET (20% or higher), picaridin, or IR3535 in it to the following areas: Any bare skin. Avoid areas around the eyes and mouth. Edges of clothing, like the top of your boots, the bottom of your pant legs, and your sleeve cuffs. Consider applying an insect repellant that contains permethrin. Follow the instructions on the label. Do not apply permethrin directly to the skin. Instead, apply to the following areas: Clothing and shoes. Outdoor gear and tents. When you are outdoors: Avoid walking through areas with long grass. If you are walking on a trail, stay in the middle of the trail so your skin, hair, and clothing do not touch the bushes. Check for ticks on your clothing, hair, and skin often while you are outdoors. Check again before you go inside. When you go indoors: Check your clothing for ticks. Tumble dry clothes in a dryer on high heat for at least 10 minutes. If clothes are damp, additional time may be needed. If clothes require washing, use hot water. Check your gear and  pets. Shower soon after being outdoors. Check your body for ticks. Do a full body check using a mirror. Be sure to check your scalp, neck, armpits, waist, groin, and joint areas. These are the spots where ticks attach themselves most often. What is the best way to remove a tick?  Remove the tick as soon as possible. Removing it can prevent germs from passing to your body. Do not remove the tick with your bare fingers. Do not try to remove a tick with heat, alcohol, petroleum jelly, or fingernail polish. These things can cause the tick to salivate and regurgitate into your bloodstream, increasing your risk of getting a disease. To remove a tick that is crawling on your skin: Go outside and brush the tick off. Use tape or a lint roller. To remove a tick that is attached to your skin: Wash your hands. If you have gloves, put them on. Use a fine-tipped tweezer, curved forceps, or a tick-removal tool to gently grasp the tick as close to your skin and the tick's head as possible. Gently pull with a steady, upward, and even pressure until the tick lets go. While removing the tick: Take care to keep the tick's head attached to its body. Do not twist or jerk the tick. This can make the tick's head or mouth parts break off and stay in your skin. If this happens, try to remove the mouth parts with tweezers. If you cannot remove them, leave   the area alone and let the skin heal. Do not squeeze or crush the tick's body. This could force disease-carrying fluids from the tick into your body. What should I do after removing a tick? Clean the bite area and your hands with soap and water, rubbing alcohol, or an iodine scrub. If an antiseptic cream or ointment is available, put a small amount on the bite area. Wash and disinfect any tools that you used to remove the tick. How should I dispose of a tick? To dispose of a live tick, use one of these methods: Place it in rubbing alcohol. Place it in a sealed bag  or container, and throw it away. Wrap it tightly in tape, and throw it away. Flush it down the toilet. Where to find more information Centers for Disease Control and Prevention: cdc.gov/ticks [place]. Environmental Protection Agency: epa.gov/insect-repellents Contact a health care provider if: You have symptoms of a disease after a tick bite. Symptoms of a tick-borne disease can occur from moments after the tick bites to 30 days after a tick is removed. Symptoms include: Fever or chills. A red rash that makes a circle (bull's-eye rash) in the bite area. Redness and swelling in the bite area. Headache or stiff neck. Muscle, joint, or bone pain. Abnormal tiredness. Numbness in your legs or trouble walking or moving your legs. Tender or swollen lymph glands. Abdominal pain, vomiting, diarrhea, or weight loss. Get help right away if: You are not able to remove a tick. You have muscle weakness or paralysis. Your symptoms get worse or you experience new symptoms. You find an engorged tick on your skin and you are in an area where there is a higher risk of disease from ticks. Summary Ticks may carry germs that can spread to a person through a bite. These germs can cause disease. Wear protective clothing and use insect repellent to prevent tick bites. Follow the instructions on the label. If you find a tick on your body, remove it as soon as possible. If the tick is attached, do not try to remove it with heat, alcohol, petroleum jelly, or fingernail polish. If you have symptoms of a disease after being bitten by a tick, contact a health care provider. This information is not intended to replace advice given to you by your health care provider. Make sure you discuss any questions you have with your health care provider. Document Revised: 07/20/2021 Document Reviewed: 07/20/2021 Elsevier Patient Education  2023 Elsevier Inc.  

## 2022-08-18 NOTE — Progress Notes (Signed)
[name] (spouse) called to state pre-op phone call should be directed to her due to his lack of knowledge regarding medicines and healthcare. Her number is [phone].

## 2022-08-21 ENCOUNTER — Other Ambulatory Visit: Payer: Self-pay | Admitting: Nurse Practitioner

## 2022-08-21 ENCOUNTER — Other Ambulatory Visit (HOSPITAL_COMMUNITY): Payer: Self-pay

## 2022-08-23 ENCOUNTER — Other Ambulatory Visit (HOSPITAL_COMMUNITY): Payer: Self-pay

## 2022-08-23 MED FILL — Insulin Degludec Soln Pen-Injector 100 Unit/ML: SUBCUTANEOUS | 90 days supply | Qty: 27 | Fill #0 | Status: AC

## 2022-08-24 ENCOUNTER — Other Ambulatory Visit: Payer: Self-pay

## 2022-08-24 ENCOUNTER — Other Ambulatory Visit (HOSPITAL_COMMUNITY): Payer: Self-pay

## 2022-08-25 ENCOUNTER — Encounter (HOSPITAL_BASED_OUTPATIENT_CLINIC_OR_DEPARTMENT_OTHER): Payer: Self-pay | Admitting: Orthopaedic Surgery

## 2022-08-25 ENCOUNTER — Other Ambulatory Visit: Payer: Self-pay

## 2022-08-27 ENCOUNTER — Other Ambulatory Visit (HOSPITAL_COMMUNITY): Payer: Self-pay

## 2022-08-27 ENCOUNTER — Encounter (HOSPITAL_BASED_OUTPATIENT_CLINIC_OR_DEPARTMENT_OTHER)
Admission: RE | Admit: 2022-08-27 | Discharge: 2022-08-27 | Disposition: A | Discharge status: Home / Self Care | Payer: 59 | Source: Ambulatory Visit | Attending: Orthopaedic Surgery | Admitting: Orthopaedic Surgery

## 2022-08-27 DIAGNOSIS — I10 Essential (primary) hypertension: Secondary | ICD-10-CM | POA: Diagnosis not present

## 2022-08-27 DIAGNOSIS — M19012 Primary osteoarthritis, left shoulder: Secondary | ICD-10-CM | POA: Diagnosis not present

## 2022-08-27 DIAGNOSIS — M25812 Other specified joint disorders, left shoulder: Secondary | ICD-10-CM | POA: Diagnosis not present

## 2022-08-27 DIAGNOSIS — G4730 Sleep apnea, unspecified: Secondary | ICD-10-CM | POA: Diagnosis not present

## 2022-08-27 DIAGNOSIS — I4891 Unspecified atrial fibrillation: Secondary | ICD-10-CM | POA: Diagnosis not present

## 2022-08-27 DIAGNOSIS — Z8673 Personal history of transient ischemic attack (TIA), and cerebral infarction without residual deficits: Secondary | ICD-10-CM | POA: Diagnosis not present

## 2022-08-27 DIAGNOSIS — Z87891 Personal history of nicotine dependence: Secondary | ICD-10-CM | POA: Diagnosis not present

## 2022-08-27 DIAGNOSIS — Z7984 Long term (current) use of oral hypoglycemic drugs: Secondary | ICD-10-CM | POA: Diagnosis not present

## 2022-08-27 DIAGNOSIS — E119 Type 2 diabetes mellitus without complications: Secondary | ICD-10-CM | POA: Diagnosis not present

## 2022-08-27 DIAGNOSIS — Z794 Long term (current) use of insulin: Secondary | ICD-10-CM | POA: Diagnosis not present

## 2022-08-27 DIAGNOSIS — M75102 Unspecified rotator cuff tear or rupture of left shoulder, not specified as traumatic: Secondary | ICD-10-CM | POA: Diagnosis not present

## 2022-08-27 LAB — BASIC METABOLIC PANEL
Anion gap: 7 (ref 5–15)
BUN: 11 mg/dL (ref 8–23)
CO2: 26 mmol/L (ref 22–32)
Calcium: 8.5 mg/dL — ABNORMAL LOW (ref 8.9–10.3)
Chloride: 105 mmol/L (ref 98–111)
Creatinine, Ser: 0.92 mg/dL (ref 0.61–1.24)
GFR, Estimated: >60 mL/min (ref >60)
Glucose, Bld: 144 mg/dL — ABNORMAL HIGH (ref 70–99)
Potassium: 4.5 mmol/L (ref 3.5–5.1)
Sodium: 138 mmol/L (ref 135–145)

## 2022-08-27 NOTE — Progress Notes (Signed)

## 2022-08-28 ENCOUNTER — Other Ambulatory Visit (HOSPITAL_COMMUNITY): Payer: Self-pay

## 2022-08-31 ENCOUNTER — Telehealth: Payer: Self-pay

## 2022-08-31 NOTE — Telephone Encounter (Signed)
Spoke to [place], advised the ILR is no longer working and will not cause any issues with patients upcoming surgery. Advised the monitor may move when you move it. Advised he may leave it in and does not have to have it removed. Voiced understanding and appreciative of call.

## 2022-08-31 NOTE — Telephone Encounter (Signed)
Attempted to return call. No answer, LMTCB. 

## 2022-08-31 NOTE — Telephone Encounter (Signed)
-----   Message from Shona Simpson, RN sent at 08/31/2022  8:46 AM EDT ----- Regarding: FW: Pt advice Pt has linq. See below ----- Message ----- From: Alcide Clever Sent: 08/31/2022   8:10 AM EDT To: Shona Simpson, RN Subject: Pt advice                                      Wife called and stated that he was saying he felt like his device has moved, and she wanted to know will that be alright since he is having a procedure done tomorrow? She have asked that you give her a call at [phone] Travis [name] (wife).

## 2022-09-01 ENCOUNTER — Ambulatory Visit (HOSPITAL_BASED_OUTPATIENT_CLINIC_OR_DEPARTMENT_OTHER)
Admission: RE | Admit: 2022-09-01 | Discharge: 2022-09-01 | Disposition: A | Discharge status: Home / Self Care | Payer: 59 | Attending: Orthopaedic Surgery | Admitting: Orthopaedic Surgery

## 2022-09-01 ENCOUNTER — Other Ambulatory Visit: Payer: Self-pay

## 2022-09-01 ENCOUNTER — Ambulatory Visit (HOSPITAL_BASED_OUTPATIENT_CLINIC_OR_DEPARTMENT_OTHER): Payer: 59 | Admitting: Certified Registered"

## 2022-09-01 ENCOUNTER — Encounter: Payer: Self-pay | Admitting: Orthopaedic Surgery

## 2022-09-01 ENCOUNTER — Encounter (HOSPITAL_BASED_OUTPATIENT_CLINIC_OR_DEPARTMENT_OTHER): Payer: Self-pay | Admitting: Orthopaedic Surgery

## 2022-09-01 ENCOUNTER — Encounter (HOSPITAL_BASED_OUTPATIENT_CLINIC_OR_DEPARTMENT_OTHER)
Admission: RE | Disposition: A | Discharge status: Home / Self Care | Payer: Self-pay | Source: Home / Self Care | Attending: Orthopaedic Surgery

## 2022-09-01 ENCOUNTER — Other Ambulatory Visit (HOSPITAL_COMMUNITY): Payer: Self-pay

## 2022-09-01 DIAGNOSIS — I10 Essential (primary) hypertension: Secondary | ICD-10-CM | POA: Insufficient documentation

## 2022-09-01 DIAGNOSIS — Z87891 Personal history of nicotine dependence: Secondary | ICD-10-CM

## 2022-09-01 DIAGNOSIS — S43432A Superior glenoid labrum lesion of left shoulder, initial encounter: Secondary | ICD-10-CM | POA: Diagnosis not present

## 2022-09-01 DIAGNOSIS — E1159 Type 2 diabetes mellitus with other circulatory complications: Secondary | ICD-10-CM

## 2022-09-01 DIAGNOSIS — M25812 Other specified joint disorders, left shoulder: Secondary | ICD-10-CM | POA: Insufficient documentation

## 2022-09-01 DIAGNOSIS — M19012 Primary osteoarthritis, left shoulder: Secondary | ICD-10-CM

## 2022-09-01 DIAGNOSIS — M75102 Unspecified rotator cuff tear or rupture of left shoulder, not specified as traumatic: Secondary | ICD-10-CM

## 2022-09-01 DIAGNOSIS — Z7984 Long term (current) use of oral hypoglycemic drugs: Secondary | ICD-10-CM | POA: Diagnosis not present

## 2022-09-01 DIAGNOSIS — Z794 Long term (current) use of insulin: Secondary | ICD-10-CM | POA: Diagnosis not present

## 2022-09-01 DIAGNOSIS — E119 Type 2 diabetes mellitus without complications: Secondary | ICD-10-CM | POA: Insufficient documentation

## 2022-09-01 DIAGNOSIS — G8918 Other acute postprocedural pain: Secondary | ICD-10-CM | POA: Diagnosis not present

## 2022-09-01 DIAGNOSIS — I4891 Unspecified atrial fibrillation: Secondary | ICD-10-CM | POA: Diagnosis not present

## 2022-09-01 DIAGNOSIS — M7542 Impingement syndrome of left shoulder: Secondary | ICD-10-CM

## 2022-09-01 DIAGNOSIS — M75112 Incomplete rotator cuff tear or rupture of left shoulder, not specified as traumatic: Secondary | ICD-10-CM | POA: Diagnosis not present

## 2022-09-01 DIAGNOSIS — S43432D Superior glenoid labrum lesion of left shoulder, subsequent encounter: Secondary | ICD-10-CM | POA: Diagnosis not present

## 2022-09-01 DIAGNOSIS — Z8673 Personal history of transient ischemic attack (TIA), and cerebral infarction without residual deficits: Secondary | ICD-10-CM | POA: Insufficient documentation

## 2022-09-01 DIAGNOSIS — G4730 Sleep apnea, unspecified: Secondary | ICD-10-CM | POA: Diagnosis not present

## 2022-09-01 HISTORY — PX: SHOULDER ARTHROSCOPY: SHX128

## 2022-09-01 LAB — GLUCOSE, CAPILLARY
Glucose-Capillary: 133 mg/dL — ABNORMAL HIGH (ref 70–99)
Glucose-Capillary: 126 mg/dL — ABNORMAL HIGH (ref 70–99)

## 2022-09-01 SURGERY — SHOULDER ARTHROSCOPY WITH SUBACROMIAL DECOMPRESSION AND DISTAL CLAVICLE EXCISION
Anesthesia: General | Site: Shoulder | Laterality: Left

## 2022-09-01 MED ORDER — MIDAZOLAM HCL 2 MG/2ML IJ SOLN
INTRAMUSCULAR | Status: AC
  Filled 2022-09-01: qty 2

## 2022-09-01 MED ORDER — LACTATED RINGERS IV SOLN: INTRAVENOUS | Status: DC

## 2022-09-01 MED ORDER — PROPOFOL 10 MG/ML IV BOLUS
INTRAVENOUS | Status: AC
  Filled 2022-09-01: qty 20

## 2022-09-01 MED ORDER — SUGAMMADEX SODIUM 200 MG/2ML IV SOLN
INTRAVENOUS | Status: DC | PRN
  Administered 2022-09-01: 200 mg via INTRAVENOUS

## 2022-09-01 MED ORDER — ONDANSETRON HCL 4 MG PO TABS
4.0000 mg | ORAL_TABLET | Freq: Three times a day (TID) | ORAL | 0 refills | Status: DC | PRN
  Filled 2022-09-01 (×2): qty 20, 4d supply, fill #0

## 2022-09-01 MED ORDER — PHENYLEPHRINE HCL (PRESSORS) 10 MG/ML IV SOLN
INTRAVENOUS | Status: AC
  Filled 2022-09-01: qty 1

## 2022-09-01 MED ORDER — EPHEDRINE SULFATE (PRESSORS) 50 MG/ML IJ SOLN
INTRAMUSCULAR | Status: DC | PRN
  Administered 2022-09-01: 10 mg via INTRAVENOUS

## 2022-09-01 MED ORDER — DEXAMETHASONE SODIUM PHOSPHATE 10 MG/ML IJ SOLN
INTRAMUSCULAR | Status: DC | PRN
  Administered 2022-09-01: 5 mg via INTRAVENOUS

## 2022-09-01 MED ORDER — PHENYLEPHRINE HCL-NACL 20-0.9 MG/250ML-% IV SOLN
INTRAVENOUS | Status: DC | PRN
  Administered 2022-09-01: 25 ug/min via INTRAVENOUS

## 2022-09-01 MED ORDER — BUPIVACAINE HCL (PF) 0.5 % IJ SOLN
INTRAMUSCULAR | Status: DC | PRN
  Administered 2022-09-01: 15 mL via PERINEURAL

## 2022-09-01 MED ORDER — FENTANYL CITRATE (PF) 100 MCG/2ML IJ SOLN
50.0000 ug | Freq: Once | INTRAMUSCULAR | Status: AC
  Administered 2022-09-01: 50 ug via INTRAVENOUS

## 2022-09-01 MED ORDER — BUPIVACAINE-EPINEPHRINE (PF) 0.25% -1:200000 IJ SOLN
INTRAMUSCULAR | Status: DC | PRN
  Administered 2022-09-01: 10 mL

## 2022-09-01 MED ORDER — ACETAMINOPHEN 10 MG/ML IV SOLN: 1000.0000 mg | Freq: Once | INTRAVENOUS | Status: DC | PRN

## 2022-09-01 MED ORDER — FENTANYL CITRATE (PF) 100 MCG/2ML IJ SOLN
INTRAMUSCULAR | Status: DC | PRN
  Administered 2022-09-01: 100 ug via INTRAVENOUS

## 2022-09-01 MED ORDER — LIDOCAINE 2% (20 MG/ML) 5 ML SYRINGE
INTRAMUSCULAR | Status: AC
  Filled 2022-09-01: qty 5

## 2022-09-01 MED ORDER — ROCURONIUM BROMIDE 10 MG/ML (PF) SYRINGE
PREFILLED_SYRINGE | INTRAVENOUS | Status: AC
  Filled 2022-09-01: qty 10

## 2022-09-01 MED ORDER — LIDOCAINE 2% (20 MG/ML) 5 ML SYRINGE
INTRAMUSCULAR | Status: DC | PRN
  Administered 2022-09-01: 100 mg via INTRAVENOUS

## 2022-09-01 MED ORDER — OXYCODONE-ACETAMINOPHEN 5-325 MG PO TABS
1.0000 | ORAL_TABLET | Freq: Two times a day (BID) | ORAL | 0 refills | Status: DC | PRN
  Filled 2022-09-01 (×2): qty 20, 5d supply, fill #0

## 2022-09-01 MED ORDER — CEFAZOLIN SODIUM-DEXTROSE 2-4 GM/100ML-% IV SOLN
INTRAVENOUS | Status: AC
  Filled 2022-09-01: qty 100

## 2022-09-01 MED ORDER — PROPOFOL 10 MG/ML IV BOLUS
INTRAVENOUS | Status: DC | PRN
  Administered 2022-09-01: 150 mg via INTRAVENOUS

## 2022-09-01 MED ORDER — DEXAMETHASONE SODIUM PHOSPHATE 10 MG/ML IJ SOLN
INTRAMUSCULAR | Status: AC
  Filled 2022-09-01: qty 1

## 2022-09-01 MED ORDER — MIDAZOLAM HCL 2 MG/2ML IJ SOLN
2.0000 mg | Freq: Once | INTRAMUSCULAR | Status: AC
  Administered 2022-09-01: 2 mg via INTRAVENOUS

## 2022-09-01 MED ORDER — PHENYLEPHRINE HCL (PRESSORS) 10 MG/ML IV SOLN
INTRAVENOUS | Status: DC | PRN
  Administered 2022-09-01: 80 ug via INTRAVENOUS
  Administered 2022-09-01: 160 ug via INTRAVENOUS

## 2022-09-01 MED ORDER — ONDANSETRON HCL 4 MG/2ML IJ SOLN
INTRAMUSCULAR | Status: DC | PRN
  Administered 2022-09-01: 4 mg via INTRAVENOUS

## 2022-09-01 MED ORDER — FENTANYL CITRATE (PF) 100 MCG/2ML IJ SOLN: 25.0000 ug | INTRAMUSCULAR | Status: DC | PRN

## 2022-09-01 MED ORDER — ROCURONIUM BROMIDE 100 MG/10ML IV SOLN
INTRAVENOUS | Status: DC | PRN
  Administered 2022-09-01: 60 mg via INTRAVENOUS

## 2022-09-01 MED ORDER — BUPIVACAINE-EPINEPHRINE (PF) 0.5% -1:200000 IJ SOLN
INTRAMUSCULAR | Status: AC
  Filled 2022-09-01: qty 30

## 2022-09-01 MED ORDER — FENTANYL CITRATE (PF) 100 MCG/2ML IJ SOLN
INTRAMUSCULAR | Status: AC
  Filled 2022-09-01: qty 2

## 2022-09-01 MED ORDER — ONDANSETRON HCL 4 MG/2ML IJ SOLN: 4.0000 mg | Freq: Once | INTRAMUSCULAR | Status: DC | PRN

## 2022-09-01 MED ORDER — SODIUM CHLORIDE 0.9 % IR SOLN
Status: DC | PRN
  Administered 2022-09-01: 6000

## 2022-09-01 MED ORDER — METHOCARBAMOL 750 MG PO TABS
750.0000 mg | ORAL_TABLET | Freq: Two times a day (BID) | ORAL | 3 refills | Status: DC | PRN
  Filled 2022-09-01 (×2): qty 20, 10d supply, fill #0

## 2022-09-01 MED ORDER — CEFAZOLIN SODIUM-DEXTROSE 2-4 GM/100ML-% IV SOLN
2.0000 g | INTRAVENOUS | Status: AC
  Administered 2022-09-01: 2 g via INTRAVENOUS

## 2022-09-01 MED ORDER — ONDANSETRON HCL 4 MG/2ML IJ SOLN
INTRAMUSCULAR | Status: AC
  Filled 2022-09-01: qty 2

## 2022-09-01 MED ORDER — BUPIVACAINE LIPOSOME 1.3 % IJ SUSP
INTRAMUSCULAR | Status: DC | PRN
  Administered 2022-09-01: 10 mL via PERINEURAL

## 2022-09-01 MED ORDER — PHENYLEPHRINE 80 MCG/ML (10ML) SYRINGE FOR IV PUSH (FOR BLOOD PRESSURE SUPPORT)
PREFILLED_SYRINGE | INTRAVENOUS | Status: AC
  Filled 2022-09-01: qty 10

## 2022-09-01 SURGICAL SUPPLY — 69 items
ADH SKN CLS APL DERMABOND .7 (GAUZE/BANDAGES/DRESSINGS)
APL SKNCLS STERI-STRIP NONHPOA (GAUZE/BANDAGES/DRESSINGS)
BENZOIN TINCTURE PRP APPL 2/3 (GAUZE/BANDAGES/DRESSINGS) IMPLANT
BLADE EXCALIBUR 4.0X13 (MISCELLANEOUS) IMPLANT
BLADE SURG 15 STRL LF DISP TIS (BLADE) IMPLANT
BLADE SURG 15 STRL SS (BLADE)
BNDG CMPR 5X4 CHSV STRCH STRL (GAUZE/BANDAGES/DRESSINGS) ×1
BNDG COHESIVE 4X5 TAN STRL LF (GAUZE/BANDAGES/DRESSINGS) ×1 IMPLANT
BURR OVAL 8 FLU 4.0X13 (MISCELLANEOUS) ×1 IMPLANT
CANNULA 5.75X71 LONG (CANNULA) ×1 IMPLANT
CANNULA SHOULDER 7CM (CANNULA) ×1 IMPLANT
CANNULA TWIST IN 8.25X7CM (CANNULA) IMPLANT
COOLER ICEMAN CLASSIC (MISCELLANEOUS) ×1 IMPLANT
DERMABOND ADVANCED .7 DNX12 (GAUZE/BANDAGES/DRESSINGS) IMPLANT
DISSECTOR  3.8MM X 13CM (MISCELLANEOUS)
DISSECTOR 3.8MM X 13CM (MISCELLANEOUS) IMPLANT
DRAPE IMP U-DRAPE 54X76 (DRAPES) ×1 IMPLANT
DRAPE INCISE IOBAN 66X45 STRL (DRAPES) ×1 IMPLANT
DRAPE POUCH INSTRU U-SHP 10X18 (DRAPES) ×1 IMPLANT
DRAPE STERI 35X30 U-POUCH (DRAPES) ×1 IMPLANT
DRAPE SURG 17X23 STRL (DRAPES) ×1 IMPLANT
DRAPE U-SHAPE 47X51 STRL (DRAPES) ×2 IMPLANT
DRAPE U-SHAPE 76X120 STRL (DRAPES) ×2 IMPLANT
DURAPREP 26ML APPLICATOR (WOUND CARE) ×1 IMPLANT
ELECT REM PT RETURN 9FT ADLT (ELECTROSURGICAL) ×1
ELECTRODE REM PT RTRN 9FT ADLT (ELECTROSURGICAL) ×1 IMPLANT
FIBER TAPE 2MM (SUTURE) IMPLANT
GAUZE PAD ABD 8X10 STRL (GAUZE/BANDAGES/DRESSINGS) ×1 IMPLANT
GAUZE SPONGE 4X4 12PLY STRL (GAUZE/BANDAGES/DRESSINGS) ×1 IMPLANT
GAUZE XEROFORM 1X8 LF (GAUZE/BANDAGES/DRESSINGS) ×1 IMPLANT
GLOVE ECLIPSE 7.0 STRL STRAW (GLOVE) ×1 IMPLANT
GLOVE INDICATOR 7.0 STRL GRN (GLOVE) ×1 IMPLANT
GLOVE INDICATOR 7.5 STRL GRN (GLOVE) ×1 IMPLANT
GLOVE SURG SYN 7.5  E (GLOVE) ×2
GLOVE SURG SYN 7.5 E (GLOVE) ×2 IMPLANT
GLOVE SURG SYN 7.5 PF PI (GLOVE) ×2 IMPLANT
GOWN STRL REUS W/ TWL LRG LVL3 (GOWN DISPOSABLE) ×1 IMPLANT
GOWN STRL REUS W/ TWL XL LVL3 (GOWN DISPOSABLE) ×1 IMPLANT
GOWN STRL REUS W/TWL LRG LVL3 (GOWN DISPOSABLE) ×1
GOWN STRL REUS W/TWL XL LVL3 (GOWN DISPOSABLE) ×1
GOWN STRL SURGICAL XL XLNG (GOWN DISPOSABLE) ×1 IMPLANT
MANIFOLD NEPTUNE II (INSTRUMENTS) ×1 IMPLANT
NDL HD SCORPION MEGA LOADER (NEEDLE) IMPLANT
PACK ARTHROSCOPY DSU (CUSTOM PROCEDURE TRAY) ×1 IMPLANT
PACK BASIN DAY SURGERY FS (CUSTOM PROCEDURE TRAY) ×1 IMPLANT
PAD COLD SHLDR WRAP-ON (PAD) ×1 IMPLANT
PORT APPOLLO RF 90DEGREE MULTI (SURGICAL WAND) ×1 IMPLANT
SHEET MEDIUM DRAPE 40X70 STRL (DRAPES) ×1 IMPLANT
SLEEVE SCD COMPRESS KNEE MED (STOCKING) ×1 IMPLANT
SLING ARM FOAM STRAP LRG (SOFTGOODS) IMPLANT
SPIKE FLUID TRANSFER (MISCELLANEOUS) IMPLANT
STRIP CLOSURE SKIN 1/2X4 (GAUZE/BANDAGES/DRESSINGS) IMPLANT
SUPPORT WRAP ARM LG (MISCELLANEOUS) IMPLANT
SUT ETHILON 3 0 PS 1 (SUTURE) ×1 IMPLANT
SUT FIBERWIRE #2 38 T-5 BLUE (SUTURE)
SUT MNCRL AB 4-0 PS2 18 (SUTURE) ×1 IMPLANT
SUT PDS AB 1 CT  36 (SUTURE)
SUT PDS AB 1 CT 36 (SUTURE) IMPLANT
SUT TIGER TAPE 7 IN WHITE (SUTURE) IMPLANT
SUT VIC AB 2-0 CT1 27 (SUTURE) ×1
SUT VIC AB 2-0 CT1 TAPERPNT 27 (SUTURE) ×1 IMPLANT
SUTURE FIBERWR #2 38 T-5 BLUE (SUTURE) IMPLANT
SUTURE TAPE 1.3 40 TPR END (SUTURE) IMPLANT
SUTURE TAPE TIGERLINK 1.3MM BL (SUTURE) IMPLANT
SUTURETAPE 1.3 40 TPR END (SUTURE)
SUTURETAPE TIGERLINK 1.3MM BL (SUTURE)
TOWEL GREEN STERILE FF (TOWEL DISPOSABLE) ×1 IMPLANT
TUBE CONNECTING 20X1/4 (TUBING) IMPLANT
TUBING ARTHROSCOPY IRRIG 16FT (MISCELLANEOUS) IMPLANT

## 2022-09-01 NOTE — H&P (Signed)
PREOPERATIVE H&P  Chief Complaint: left shoulder impingement, rotator cuff tear, acromioclavicular arthritis  HPI: Travis [name] is a 62 y.o. male who presents for surgical treatment of left shoulder impingement, rotator cuff tear, acromioclavicular arthritis.  He denies any changes in medical history.  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   Anxiety    Arthritis    Blood transfusion without reported diagnosis    Complication of anesthesia    bleed after last shoulder-had to stay overnight   Depression    Diabetes mellitus    Diabetic peripheral neuropathy (HCC) 03/28/2013   Diverticulosis    Dizziness    Hernia, inguinal    Hyperlipidemia    Hypertension    Impingement syndrome of left shoulder 08/02/2014   Ischemic colitis (HCC)    Multi-infarct state 10/14/2014   Neuropathy of lower extremity    Night sweats    every once in a while   Sleep apnea    no CPAP   Stroke (HCC) 02/2015   Syncope and collapse    Past Surgical History:  Procedure Laterality Date   BIOPSY  06/14/2019   Procedure: BIOPSY;  Surgeon: Sherrilyn Rist, MD;  Location: [facility];  Service: Gastroenterology;;   CERVICAL DISC SURGERY     COLON SURGERY  2003   1/3 removed for diverticulitis   COLONOSCOPY     COLONOSCOPY WITH PROPOFOL N/A 06/14/2019   Procedure: COLONOSCOPY WITH PROPOFOL;  Surgeon: Sherrilyn Rist, MD;  Location: [facility];  Service: Gastroenterology;  Laterality: N/A;   EP IMPLANTABLE DEVICE N/A 05/01/2015   Procedure: Loop Recorder Insertion;  Surgeon: Duke Salvia, MD;  Location: [facility];  Service: Cardiovascular;  Laterality: N/A;   INGUINAL HERNIA REPAIR Bilateral    KNEE ARTHROSCOPY Right    SHOULDER ARTHROSCOPY Right    1   SHOULDER ARTHROSCOPY Left 08/02/2014   Procedure: LEFT SHOULDER SCOPE DEBRIDEMENT/ACROMIOPLASTY;  Surgeon: Teryl Lucy, MD;   Location: [facility];  Service: Orthopedics;  Laterality: Left;  ANESTHESIA: GENERAL, PRE/POST OP SCALENE   TEE WITHOUT CARDIOVERSION N/A 03/03/2015   Procedure: TRANSESOPHAGEAL ECHOCARDIOGRAM (TEE);  Surgeon: Laqueta Linden, MD;  Location: [facility];  Service: Cardiology;  Laterality: N/A;   UMBILICAL HERNIA REPAIR     with other hernia repair with mesh   WRIST SURGERY Right    fusion   Social History   Socioeconomic History   Marital status: Married    Spouse name: Not on file   Number of children: 2   Years of education: College   Highest education level: Not on file  Occupational History   Occupation: disabled  Tobacco Use   Smoking status: Former    Packs/day: 1.00    Years: 8.00    Additional pack years: 0.00    Total pack years: 8.00    Types: Cigarettes    Start date: 06/07/1970    Quit date: 05/03/1978    Years since quitting: 44.3   Smokeless tobacco: Former    Types: Chew    Quit date: 05/03/1978   Tobacco comments:    Former smoker 07/19/22  Vaping Use   Vaping Use: Never used  Substance and Sexual Activity   Alcohol use: Yes    Alcohol/week: 1.0 standard drink of alcohol    Types: 1 Cans of beer per week  Comment: 1 beer every 6 months h/o heavy use in the past 07/19/22   Drug use: No   Sexual activity: Yes  Other Topics Concern   Not on file  Social History Narrative   Drinks some coffee, Drink diet sodas and tea   Social Determinants of Health   Financial Resource Strain: Not on file  Food Insecurity: Not on file  Transportation Needs: Not on file  Physical Activity: Not on file  Stress: Not on file  Social Connections: Not on file   Family History  Problem Relation Age of Onset   Stroke Father    Hyperlipidemia Father    Heart attack Sister 55   Stroke Sister    Dementia Mother    ALS Brother        age 71   Diabetes Maternal Grandfather    Colon cancer Neg Hx    Esophageal cancer Neg Hx    Stomach cancer Neg Hx     Rectal cancer Neg Hx    Allergies  Allergen Reactions   Lisinopril Cough    Patient/spouse is not aware/familiar with why this is listed as an allergy Not a true allergy but on this list to avoid ACE inhibitors-SA Luking MD   Prior to Admission medications   Medication Sig Start Date End Date Taking? Authorizing Provider  ALPRAZolam (XANAX) 0.25 MG tablet Take 1 tablet (0.25 mg total) by mouth 3 (three) times daily as needed. 07/09/22 01/05/23 Yes Luking, Jonna Coup, MD  apixaban (ELIQUIS) 5 MG TABS tablet Take 1 tablet (5 mg total) by mouth 2 (two) times daily. 07/19/22  Yes Fenton, Clint R, PA  atorvastatin (LIPITOR) 20 MG tablet Take 1 tablet (20 mg total) by mouth daily. 05/11/22  Yes Babs Sciara, MD  Cholecalciferol 50 MCG (2000 UT) CAPS Take 1 capsule (2,000 Units total) by mouth daily with breakfast. 05/17/18  Yes Nida, Denman George, MD  FLUoxetine (PROZAC) 20 MG capsule Take 1 capsule by mouth daily. 05/11/22  Yes Luking, Jonna Coup, MD  fluticasone (FLONASE) 50 MCG/ACT nasal spray Place 1 spray into both nostrils daily. 04/09/22  Yes Babs Sciara, MD  folic acid (FOLVITE) 1 MG tablet Take 1 tablet (1 mg total) by mouth daily. 11/30/21 12/07/22 Yes Luking, Jonna Coup, MD  gabapentin (NEURONTIN) 300 MG capsule Take 1 capsule (300 mg total) by mouth every morning AND 1 capsule (300 mg total) daily after lunch AND 2 capsules (600 mg total) at bedtime. 02/03/22 02/03/23 Yes McCue, Shanda Bumps, NP  insulin degludec (TRESIBA FLEXTOUCH) 100 UNIT/ML FlexTouch Pen Inject 30 Units into the skin at bedtime. 08/23/22  Yes Dani Gobble, NP  metFORMIN (GLUCOPHAGE) 500 MG tablet Take 1 tablet (500 mg total) by mouth 2 (two) times daily with a meal. 04/01/22  Yes Reardon, Freddi Starr, NP  Multiple Vitamin (MULTIVITAMIN WITH MINERALS) TABS tablet Take 1 tablet daily by mouth.   Yes [provider]  valACYclovir (VALTREX) 1000 MG tablet Take 1 tablet by mouth daily. 05/11/22  Yes Babs Sciara, MD  Accu-Chek  FastClix Lancets MISC Use to check blood sugar twice daily. 06/25/22 06/25/23  Dani Gobble, NP  Blood Glucose Monitoring Suppl (FREESTYLE LITE) w/Device KIT Use to check glucose twice daily 06/29/22   Dani Gobble, NP  Blood Pressure Monitoring (OMRON 3 SERIES BP MONITOR) DEVI USE AS DIRECTED. 11/28/20   Andrez Grime, RPH  Continuous Blood Gluc Sensor (DEXCOM G7 SENSOR) MISC Inject into the skin as directed to  check continuous blood sugar. Change sensor every 10 days as directed. 08/04/22   Dani Gobble, NP  COVID-19 mRNA vaccine [id number] (COMIRNATY) syringe Inject into the muscle. 05/07/22   Judyann Munson, MD  glucose blood (ACCU-CHEK GUIDE) test strip USE AS DIRECTED TO TEST BLOOD SUGAR 2 TIMES DAILY AS DIRECTED 06/25/22 06/25/23  Dani Gobble, NP  hyoscyamine (LEVSIN SL) 0.125 MG SL tablet Place 1 tablet (0.125 mg total) under the tongue daily before breakfast. 06/04/19   Esterwood, Amy S, PA-C  Insulin Pen Needle (COMFORT EZ PEN NEEDLES) 31G X 6 MM MISC USE TO INJECT INSULIN DAILY AS DIRECTED 06/28/22   Dani Gobble, NP  Insulin Pen Needle (UNIFINE PENTIPS) 31G X 6 MM MISC USE TO INJECT INSULIN DAILY AS DIRECTED 04/01/22   Dani Gobble, NP  RSV vaccine recomb adjuvanted (AREXVY) 120 MCG/0.5ML injection Inject into the muscle. 05/18/22     Insulin Glargine (BASAGLAR KWIKPEN) 100 UNIT/ML INJECT 50 UNITS INTO THE SKIN AT BEDTIME 06/20/20 07/15/20  Dani Gobble, NP     Positive ROS: All other systems have been reviewed and were otherwise negative with the exception of those mentioned in the HPI and as above.  Physical Exam: General: Alert, no acute distress Cardiovascular: No pedal edema Respiratory: No cyanosis, no use of accessory musculature GI: abdomen soft Skin: No lesions in the area of chief complaint Neurologic: Sensation intact distally Psychiatric: Patient is competent for consent with normal mood and affect Lymphatic: no  lymphedema  MUSCULOSKELETAL: exam stable  Assessment: left shoulder impingement, rotator cuff tear, acromioclavicular arthritis  Plan: Plan for Procedure(s): LEFT SHOULDER ARTHROSCOPY, EXTENSIVE DEBRIDEMENT,DISTAL CLAVICLE EXCISION, SUBACROMIAL DECOMPRESSION, ROTATOR CUFF REPAIR  The risks benefits and alternatives were discussed with the patient including but not limited to the risks of nonoperative treatment, versus surgical intervention including infection, bleeding, nerve injury,  blood clots, cardiopulmonary complications, morbidity, mortality, among others, and they were willing to proceed.   Glee Arvin, MD 09/01/2022 10:10 AM

## 2022-09-01 NOTE — Anesthesia Preprocedure Evaluation (Addendum)
Anesthesia Evaluation  Patient identified by MRN, date of birth, ID band Patient awake    Reviewed: Allergy & Precautions, NPO status , Patient's Chart, lab work & pertinent test results  Airway Mallampati: II  TM Distance: >3 FB Neck ROM: Full    Dental no notable dental hx. (+) Teeth Intact, Dental Advisory Given   Pulmonary sleep apnea , former smoker   Pulmonary exam normal breath sounds clear to auscultation       Cardiovascular hypertension, Normal cardiovascular exam+ dysrhythmias Atrial Fibrillation  Rhythm:Regular Rate:Normal     Neuro/Psych CVA, No Residual Symptoms    GI/Hepatic negative GI ROS, Neg liver ROS,,,  Endo/Other  diabetes, Well Controlled, Type 2, Insulin Dependent, Oral Hypoglycemic Agents    Renal/GU      Musculoskeletal  (+) Arthritis ,    Abdominal   Peds  Hematology   Anesthesia Other Findings All: lisonopril  Reproductive/Obstetrics                             Anesthesia Physical Anesthesia Plan  ASA: 3  Anesthesia Plan: General and Regional   Post-op Pain Management: Regional block* and Minimal or no pain anticipated   Induction: Intravenous  PONV Risk Score and Plan: Midazolam, Treatment may vary due to age or medical condition and Ondansetron  Airway Management Planned: Oral ETT  Additional Equipment: None  Intra-op Plan:   Post-operative Plan: Extubation in OR  Informed Consent: I have reviewed the patients History and Physical, chart, labs and discussed the procedure including the risks, benefits and alternatives for the proposed anesthesia with the patient or authorized representative who has indicated his/her understanding and acceptance.     Dental advisory given  Plan Discussed with: CRNA  Anesthesia Plan Comments:        Anesthesia Quick Evaluation

## 2022-09-01 NOTE — Anesthesia Procedure Notes (Signed)
Anesthesia Regional Block: Interscalene brachial plexus block   Pre-Anesthetic Checklist: , timeout performed,  Correct Patient, Correct Site, Correct Laterality,  Correct Procedure, Correct Position, site marked,  Risks and benefits discussed,  Surgical consent,  Pre-op evaluation,  At surgeon's request and post-op pain management  Laterality: Upper and Left  Prep: Maximum Sterile Barrier Precautions used, chloraprep       Needles:  Injection technique: Single-shot  Needle Type: Echogenic Needle     Needle Length: 5cm  Needle Gauge: 21     Additional Needles:   Procedures:,,,, ultrasound used (permanent image in chart),,    Narrative:  Start time: 09/01/2022 11:26 AM End time: 09/01/2022 11:32 AM Injection made incrementally with aspirations every 5 mL.  Performed by: Personally  Anesthesiologist: Trevor Iha, MD  Additional Notes: Block assessed prior to procedure. Patient tolerated procedure well.

## 2022-09-01 NOTE — Anesthesia Procedure Notes (Signed)
Procedure Name: Intubation Date/Time: 09/01/2022 12:17 PM  Performed by: Lauralyn Primes, CRNAPre-anesthesia Checklist: Patient identified, Emergency Drugs available, Suction available and Patient being monitored Patient Re-evaluated:Patient Re-evaluated prior to induction Oxygen Delivery Method: Circle system utilized Preoxygenation: Pre-oxygenation with 100% oxygen Induction Type: IV induction Ventilation: Mask ventilation without difficulty and Oral airway inserted - appropriate to patient size Laryngoscope Size: Mac and 4 Grade View: Grade I Tube type: Oral Tube size: 7.5 mm Number of attempts: 1 Airway Equipment and Method: Stylet, Oral airway and Bite block Placement Confirmation: ETT inserted through vocal cords under direct vision, positive ETCO2 and breath sounds checked- equal and bilateral Secured at: 23 cm Tube secured with: Tape Dental Injury: Teeth and Oropharynx as per pre-operative assessment

## 2022-09-01 NOTE — Transfer of Care (Signed)
Immediate Anesthesia Transfer of Care Note  Patient: Travis [name]  Procedure(s) Performed: LEFT SHOULDER ARTHROSCOPY, EXTENSIVE DEBRIDEMENT,DISTAL CLAVICLE EXCISION, SUBACROMIAL DECOMPRESSION (Left: Shoulder)  Patient Location: PACU  Anesthesia Type:GA combined with regional for post-op pain  Level of Consciousness: drowsy  Airway & Oxygen Therapy: Patient Spontanous Breathing and Patient connected to face mask oxygen  Post-op Assessment: Report given to RN and Post -op Vital signs reviewed and stable  Post vital signs: Reviewed and stable  Last Vitals:  Vitals Value Taken Time  BP 143/88 09/01/22 1338  Temp    Pulse 62 09/01/22 1340  Resp 21 09/01/22 1340  SpO2 99 % 09/01/22 1340  Vitals shown include unvalidated device data.  Last Pain:  Vitals:   09/01/22 1039  TempSrc: Oral  PainSc: 0-No pain         Complications: No notable events documented.

## 2022-09-01 NOTE — Anesthesia Postprocedure Evaluation (Signed)
Anesthesia Post Note  Patient: Travis [name]  Procedure(s) Performed: LEFT SHOULDER ARTHROSCOPY, EXTENSIVE DEBRIDEMENT,DISTAL CLAVICLE EXCISION, SUBACROMIAL DECOMPRESSION (Left: Shoulder)     Patient location during evaluation: PACU Anesthesia Type: General and Regional Level of consciousness: awake and alert Pain management: pain level controlled Vital Signs Assessment: post-procedure vital signs reviewed and stable Respiratory status: spontaneous breathing, nonlabored ventilation, respiratory function stable and patient connected to nasal cannula oxygen Cardiovascular status: blood pressure returned to baseline and stable Postop Assessment: no apparent nausea or vomiting Anesthetic complications: no  No notable events documented.  Last Vitals:  Vitals:   09/01/22 1400 09/01/22 1426  BP: 132/87 (!) 145/96  Pulse: 67 72  Resp: 17 16  Temp:  (!) 36.3 C  SpO2: 94% 95%    Last Pain:  Vitals:   09/01/22 1426  TempSrc: Oral  PainSc: 0-No pain                 Trevor Iha

## 2022-09-01 NOTE — Op Note (Signed)
Date of Surgery: 09/01/2022  INDICATIONS: The patient is a 62 year old male with left shoulder pain that has failed conservative treatment;  The patient did consent to the procedure after discussion of the risks and benefits.  DIAGNOSES: Left shoulder, chronic rotator cuff tear, AC arthritis, subacromial impingement, and degenerative labral tears .  POST-OPERATIVE DIAGNOSIS: same  PROCEDURE: Arthroscopic extensive debridement - 29823 Subdeltoid Bursa, Supraspinatus Tendon, Anterior Labrum, Superior Labrum, and Posterior Labrum Arthroscopic distal clavicle excision - 16109 Arthroscopic subacromial decompression - 60454   OPERATIVE FINDING: Exam under anesthesia: Normal Articular space: Normal Chondral surfaces: Normal Biceps:  ruptured Subscapularis: Diffuse intrasubstance interstitial tears Supraspinatus: Diffuse intrasubstance interstitial tears Infraspinatus: Mild tendinopathy  SURGEON: N. Glee Arvin, M.D.  ASSIST: Oneal Grout, PA-C  ANESTHESIA:  general, regional  IV FLUIDS AND URINE: See anesthesia.  ESTIMATED BLOOD LOSS: minimal mL.  IMPLANTS: None  COMPLICATIONS: None.  DESCRIPTION OF PROCEDURE: The patient was brought to the operating room and placed supine on the operating table.  The patient had been signed prior to the procedure and this was documented. The patient had the anesthesia placed by the anesthesiologist.  A time-out was performed to confirm that this was the correct patient, site, side and location. The patient did receive antibiotics prior to the incision and was re-dosed during the procedure as needed at indicated intervals.  The patient was then positioned into the beach chair position with all bony prominences well padded and relaxed C spine. The patient had the operative extremity prepped and draped in the standard surgical fashion.    Incisions were made for shoulder arthroscopy portals and diagnostic shoulder arthroscopy ensued.  There was  widespread erythema of the shoulder joint.  The subscap tendon was diffusely degenerative with interstitial tears.  The biceps tendon had fully ruptured from the superior labrum.  The stump was debrided back to stable margins.  He had diffuse degenerative tearing of the anterior superior and posterior labrum.  These were all gently debrided back to stable margins.  The articular surface of the supraspinatus and infraspinatus demonstrated mild tendinopathy.  There was some degenerative spurring of the greater tuberosity.  The cuff was well attached.  The chondral surfaces were unremarkable.  The arthroscope was then repositioned into the subacromial space.  He had thickened subacromial bursa and subdeltoid bursa.  Bursectomy was performed.  He had unfavorable acromial anatomy.  He also had an enlarged distal clavicle and AC joint predisposing to impingement.  Subacromial decompression along with acromioplasty and CA ligament release was performed.  The CA ligament was detached from the acromion.  Distal clavicle excision was performed taking approximately 5 mm of distal clavicle and a couple millimeters of the acromion.  We then gently debrided the supraspinatus tendon.  He had widespread diffuse interstitial tearing.  The attachment of the cuff was intact.  There were no full-thickness tears.  I took a oscillating shaver and gently debrided the tissue back to stable margins.  The infraspinatus tendon was mildly degenerative.  Excess fluid was expressed from the shoulder.  Incisions closed with nylon.  Sterile dressings were applied.  Shoulder sling was placed.  Patient tolerated procedure well had no immediate complications. Tessa Lerner, my PA, was a medical necessity for the entirety of the surgery including opening, closing, limb positioning, retracting, exposing, and repairing.  POSTOPERATIVE PLAN: Patient will be discharged home and follow-up in 1 week for suture removal.  N. Glee Arvin, MD [facility] 1:17 PM

## 2022-09-01 NOTE — Discharge Instructions (Addendum)
? ? ?Post-operative patient instructions  ?Shoulder Arthroscopy  ? ?Ice:  Place intermittent ice or cooler pack over your shoulder, 30 minutes on and 30 minutes off.  Continue this for the first 72 hours after surgery, then save ice for use after therapy sessions or on more active days.   ?Weight:  You may bear weight on your arm as your symptoms allow. ?Motion:  Perform gentle shoulder motion as tolerated ?Dressing:  Perform 1st dressing change at 2 days postoperative. A moderate amount of blood tinged drainage is to be expected.  So if you bleed through the dressing on the first or second day or if you have fevers, it is fine to change the dressing/check the wounds early and redress wound.  If it bleeds through again, or if the incisions are leaking frank blood, please call the office. May change dressing every 1-2 days thereafter to help watch wounds. Can purchase Tegaderm (or 3M Nexcare) water resistant dressings at local pharmacy / [facility]. ?Shower:  Light shower is ok after 2 days.  Please take shower, NO bath. Recover with gauze and ace wrap to help keep wounds protected.   ?Pain medication:  A narcotic pain medication has been prescribed.  Take as directed.  Typically you need narcotic pain medication more regularly during the first 3 to 5 days after surgery.  Decrease your use of the medication as the pain improves.  Narcotics can sometimes cause constipation, even after a few doses.  If you have problems with constipation, you can take an over the counter stool softener or light laxative.  If you have persistent problems, please notify your physician?s office. ?Physical therapy: Additional activity guidelines to be provided by your physician or physical therapist at follow-up visits.  ?Driving: Do not recommend driving x 2 weeks post surgical, especially if surgery performed on right side. Should not drive while taking narcotic pain medications. It typically takes at least 2 weeks to restore sufficient  neuromuscular function for normal reaction times for driving safety.  ?Call [phone] for questions or problems. Evenings you will be forwarded to the hospital operator.  Ask for the orthopaedic physician on call. Please call if you experience:  ?  ?Redness, foul smelling, or persistent drainage from the surgical site  ?worsening shoulder pain and swelling not responsive to medication  ?any calf pain and or swelling of the lower leg  ?temperatures greater than 101.5 F ?other questions or concerns ? ? ?Thank you for allowing us to be a part of your care. ? ? ?Post Anesthesia Home Care Instructions ? ?Activity: ?Get plenty of rest for the remainder of the day. A responsible individual must stay with you for 24 hours following the procedure.  ?For the next 24 hours, DO NOT: ?-Drive a car ?-Operate machinery ?-Drink alcoholic beverages ?-Take any medication unless instructed by your physician ?-Make any legal decisions or sign important papers. ? ?Meals: ?Start with liquid foods such as gelatin or soup. Progress to regular foods as tolerated. Avoid greasy, spicy, heavy foods. If nausea and/or vomiting occur, drink only clear liquids until the nausea and/or vomiting subsides. Call your physician if vomiting continues. ? ?Special Instructions/Symptoms: ?Your throat may feel dry or sore from the anesthesia or the breathing tube placed in your throat during surgery. If this causes discomfort, gargle with warm salt water. The discomfort should disappear within 24 hours. ? ?If you had a scopolamine patch placed behind your ear for the management of post- operative nausea and/or vomiting: ? ?1.   The medication in the patch is effective for 72 hours, after which it should be removed.  Wrap patch in a tissue and discard in the trash. Wash hands thoroughly with soap and water. ?2. You may remove the patch earlier than 72 hours if you experience unpleasant side effects which may include dry mouth, dizziness or visual  disturbances. ?3. Avoid touching the patch. Wash your hands with soap and water after contact with the patch. ?   Regional Anesthesia Blocks ? ?1. Numbness or the inability to move the "blocked" extremity may last from 3-48 hours after placement. The length of time depends on the medication injected and your individual response to the medication. If the numbness is not going away after 48 hours, call your surgeon. ? ?2. The extremity that is blocked will need to be protected until the numbness is gone and the  Strength has returned. Because you cannot feel it, you will need to take extra care to avoid injury. Because it may be weak, you may have difficulty moving it or using it. You may not know what position it is in without looking at it while the block is in effect. ? ?3. For blocks in the legs and feet, returning to weight bearing and walking needs to be done carefully. You will need to wait until the numbness is entirely gone and the strength has returned. You should be able to move your leg and foot normally before you try and bear weight or walk. You will need someone to be with you when you first try to ensure you do not fall and possibly risk injury. ? ?4. Bruising and tenderness at the needle site are common side effects and will resolve in a few days. ? ?5. Persistent numbness or new problems with movement should be communicated to the surgeon or the [facility] ([phone])/ [facility] ([phone]). Information for Discharge Teaching: ?EXPAREL (bupivacaine liposome injectable suspension)  ? ?Your surgeon or anesthesiologist gave you EXPAREL(bupivacaine) to help control your pain after surgery.  ?EXPAREL is a local anesthetic that provides pain relief by numbing the tissue around the surgical site. ?EXPAREL is designed to release pain medication over time and can control pain for up to 72 hours. ?Depending on how you respond to EXPAREL, you may require less pain medication  during your recovery. ? ?Possible side effects: ?Temporary loss of sensation or ability to move in the area where bupivacaine was injected. ?Nausea, vomiting, constipation ?Rarely, numbness and tingling in your mouth or lips, lightheadedness, or anxiety may occur. ?Call your doctor right away if you think you may be experiencing any of these sensations, or if you have other questions regarding possible side effects. ? ?Follow all other discharge instructions given to you by your surgeon or nurse. Eat a healthy diet and drink plenty of water or other fluids. ? ?If you return to the hospital for any reason within 96 hours following the administration of EXPAREL, it is important for health care providers to know that you have received this anesthetic. A teal colored band has been placed on your arm with the date, time and amount of EXPAREL you have received in order to alert and inform your health care providers. Please leave this armband in place for the full 96 hours following administration, and then you may remove the band.  ?

## 2022-09-01 NOTE — Progress Notes (Signed)
Assisted Dr. Houser with left, interscalene , ultrasound guided block. Side rails up, monitors on throughout procedure. See vital signs in flow sheet. Tolerated Procedure well. 

## 2022-09-02 ENCOUNTER — Other Ambulatory Visit (HOSPITAL_COMMUNITY): Payer: Self-pay

## 2022-09-03 ENCOUNTER — Other Ambulatory Visit (HOSPITAL_COMMUNITY): Payer: Self-pay

## 2022-09-10 ENCOUNTER — Ambulatory Visit (INDEPENDENT_AMBULATORY_CARE_PROVIDER_SITE_OTHER): Payer: 59 | Admitting: Physician Assistant

## 2022-09-10 ENCOUNTER — Other Ambulatory Visit (HOSPITAL_COMMUNITY): Payer: Self-pay

## 2022-09-10 ENCOUNTER — Other Ambulatory Visit: Payer: Self-pay

## 2022-09-10 ENCOUNTER — Encounter: Payer: Self-pay | Admitting: Physician Assistant

## 2022-09-10 DIAGNOSIS — Z9889 Other specified postprocedural states: Secondary | ICD-10-CM

## 2022-09-10 MED ORDER — HYDROCODONE-ACETAMINOPHEN 5-325 MG PO TABS
1.0000 | ORAL_TABLET | Freq: Three times a day (TID) | ORAL | 0 refills | Status: DC | PRN
  Filled 2022-09-10: qty 30, 10d supply, fill #0

## 2022-09-10 NOTE — Progress Notes (Signed)
Post-Op Visit Note   Patient: Travis [name]           Date of Birth: Apr 18, 1961           MRN: [id number] Visit Date: 09/10/2022 PCP: Babs Sciara, MD   Assessment & Plan:  Chief Complaint:  Chief Complaint  Patient presents with   Left Shoulder - Follow-up    Left shoulder scope 09/01/2022   Visit Diagnoses:  1. S/P arthroscopy of left shoulder     Plan: Patient is a pleasant 62 year old gentleman who comes in today with a little over a week out from left shoulder arthroscopic debridement and decompression, date of surgery 09/01/2022.  He has been doing relatively well.  He has been taking narcotic pain medication as needed.  Examination of his left shoulder reveals fully healed surgical portals with nylon sutures in place.  No evidence of infection or cellulitis.  Fingers are warm and well-perfused.  Today, sutures were removed and Steri-Strips applied.  Intraoperative pictures reviewed.  Outpatient physical therapy referral has been sent in.  I refilled his pain medicine and have weaned to [facility].  He will follow-up with [place] in 5 weeks for recheck.  Call with concerns or questions.  Follow-Up Instructions: Return in about 5 weeks (around 10/15/2022).   Orders:  No orders of the defined types were placed in this encounter.  No orders of the defined types were placed in this encounter.   Imaging: No new imaging  PMFS History: Patient Active Problem List   Diagnosis Date Noted   Hypercoagulable state due to paroxysmal atrial fibrillation (HCC) 07/19/2022   Nontraumatic incomplete tear of left rotator cuff 07/07/2022   Arthritis of left acromioclavicular joint 07/07/2022   Right groin hernia 03/03/2021   Hyperlipidemia associated with type 2 diabetes mellitus (HCC) 12/25/2020   Tendinopathy of right rotator cuff 09/25/2020   Arthrosis of right acromioclavicular joint 09/25/2020   Adrenal adenoma, left 09/07/2019   Chronic anticoagulation    GI bleed 06/14/2019   Vitamin  D deficiency 04/18/2019   History of stroke 04/20/2018   GAD (generalized anxiety disorder) 05/15/2016   Acute bronchiolitis due to respiratory syncytial virus (RSV)    Acute CVA (cerebrovascular accident) (HCC) 05/04/2016   Dizziness 05/02/2016   Anxiety and depression 05/02/2016   Paroxysmal atrial fibrillation (HCC) 03/02/2016   PFO (patent foramen ovale) 07/24/2015   OSA (obstructive sleep apnea) 04/18/2015   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 artery (HCC) 04/18/2015   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   Snoring 01/23/2015   Degeneration of cervical intervertebral disc 01/23/2015   Essential hypertension 12/14/2014   DM type 2 causing vascular disease (HCC) 12/14/2014   Neck pain 12/14/2014   Cerebral infarction, chronic    Multi-infarct state 10/14/2014   Impingement syndrome of left shoulder 08/02/2014   Adhesive capsulitis of left shoulder 08/02/2014   Rectal bleeding 03/13/2014   Diabetes type 2, controlled (HCC) 02/18/2014   Diabetic peripheral neuropathy (HCC) 03/28/2013   Irreducible incisional hernia 11/11/2010   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   Anxiety    Arthritis    Blood transfusion without reported diagnosis    Complication of anesthesia    bleed after last shoulder-had to stay overnight   Depression    Diabetes mellitus    Diabetic peripheral neuropathy (HCC) 03/28/2013   Diverticulosis    Dizziness  Hernia, inguinal    Hyperlipidemia    Hypertension    Impingement syndrome of left shoulder 08/02/2014   Ischemic colitis (HCC)    Multi-infarct state 10/14/2014   Neuropathy of lower extremity    Night sweats    every once in a while   Sleep apnea    no CPAP   Stroke (HCC) 02/2015   Syncope and collapse     Family History  Problem Relation Age of Onset   Stroke Father    Hyperlipidemia Father    Heart attack Sister  59   Stroke Sister    Dementia Mother    ALS Brother        age 84   Diabetes Maternal Grandfather    Colon cancer Neg Hx    Esophageal cancer Neg Hx    Stomach cancer Neg Hx    Rectal cancer Neg Hx     Past Surgical History:  Procedure Laterality Date   BIOPSY  06/14/2019   Procedure: BIOPSY;  Surgeon: Sherrilyn Rist, MD;  Location: [facility];  Service: Gastroenterology;;   CERVICAL DISC SURGERY     COLON SURGERY  2003   1/3 removed for diverticulitis   COLONOSCOPY     COLONOSCOPY WITH PROPOFOL N/A 06/14/2019   Procedure: COLONOSCOPY WITH PROPOFOL;  Surgeon: Sherrilyn Rist, MD;  Location: [facility];  Service: Gastroenterology;  Laterality: N/A;   EP IMPLANTABLE DEVICE N/A 05/01/2015   Procedure: Loop Recorder Insertion;  Surgeon: Duke Salvia, MD;  Location: [facility] INVASIVE CV LAB;  Service: Cardiovascular;  Laterality: N/A;   INGUINAL HERNIA REPAIR Bilateral    KNEE ARTHROSCOPY Right    SHOULDER ARTHROSCOPY Right    1   SHOULDER ARTHROSCOPY Left 08/02/2014   Procedure: LEFT SHOULDER SCOPE DEBRIDEMENT/ACROMIOPLASTY;  Surgeon: Teryl Lucy, MD;  Location: [facility];  Service: Orthopedics;  Laterality: Left;  ANESTHESIA: GENERAL, PRE/POST OP SCALENE   TEE WITHOUT CARDIOVERSION N/A 03/03/2015   Procedure: TRANSESOPHAGEAL ECHOCARDIOGRAM (TEE);  Surgeon: Laqueta Linden, MD;  Location: [facility];  Service: Cardiology;  Laterality: N/A;   UMBILICAL HERNIA REPAIR     with other hernia repair with mesh   WRIST SURGERY Right    fusion   Social History   Occupational History   Occupation: disabled  Tobacco Use   Smoking status: Former    Packs/day: 1.00    Years: 8.00    Additional pack years: 0.00    Total pack years: 8.00    Types: Cigarettes    Start date: 06/07/1970    Quit date: 05/03/1978    Years since quitting: 44.3   Smokeless tobacco: Former    Types: Chew    Quit date: 05/03/1978   Tobacco comments:    Former smoker 07/19/22  Vaping  Use   Vaping Use: Never used  Substance and Sexual Activity   Alcohol use: Yes    Alcohol/week: 1.0 standard drink of alcohol    Types: 1 Cans of beer per week    Comment: 1 beer every 6 months h/o heavy use in the past 07/19/22   Drug use: No   Sexual activity: Yes

## 2022-09-13 ENCOUNTER — Other Ambulatory Visit: Payer: Self-pay

## 2022-09-13 ENCOUNTER — Other Ambulatory Visit: Payer: Self-pay | Admitting: Physician Assistant

## 2022-09-13 DIAGNOSIS — Z9889 Other specified postprocedural states: Secondary | ICD-10-CM

## 2022-09-17 ENCOUNTER — Telehealth: Payer: Self-pay | Admitting: Orthopaedic Surgery

## 2022-09-17 ENCOUNTER — Other Ambulatory Visit: Payer: Self-pay

## 2022-09-17 ENCOUNTER — Ambulatory Visit (HOSPITAL_COMMUNITY): Payer: 59 | Attending: Physician Assistant | Admitting: Occupational Therapy

## 2022-09-17 ENCOUNTER — Encounter (HOSPITAL_COMMUNITY): Payer: Self-pay | Admitting: Occupational Therapy

## 2022-09-17 DIAGNOSIS — R29898 Other symptoms and signs involving the musculoskeletal system: Secondary | ICD-10-CM | POA: Diagnosis not present

## 2022-09-17 DIAGNOSIS — M25612 Stiffness of left shoulder, not elsewhere classified: Secondary | ICD-10-CM | POA: Insufficient documentation

## 2022-09-17 DIAGNOSIS — Z9889 Other specified postprocedural states: Secondary | ICD-10-CM | POA: Diagnosis not present

## 2022-09-17 DIAGNOSIS — M25512 Pain in left shoulder: Secondary | ICD-10-CM | POA: Insufficient documentation

## 2022-09-17 NOTE — Telephone Encounter (Signed)
[facility] asking for a Protocol for this patient fax to --[phone] att: Cathlean Cower

## 2022-09-17 NOTE — Patient Instructions (Signed)
1) SHOULDER: Flexion On Table ° ° °Place hands on towel placed on table, elbows straight. Lean forward with you upper body, pushing towel away from body.  _10__ reps per set, __2-3_ sets per day ° °2) Abduction (Passive) ° ° °With arm out to side, resting on towel placed on table with palm DOWN, keeping trunk away from table, lean to the side while pushing towel away from body.  °Repeat __10__ times. Do __2-3__ sessions per day. ° °Copyright © [employer]. All rights reserved.  ° ° ° °3) Internal Rotation (Assistive) ° ° °Seated with elbow bent at right angle and held against side, slide arm on table surface in an inward arc keeping elbow anchored in place. °Repeat __10__ times. Do __2-3__ sessions per day. °Activity: Use this motion to brush crumbs off the table. ° °Copyright © [employer]. All rights reserved.  ° °

## 2022-09-17 NOTE — Therapy (Signed)
OUTPATIENT OCCUPATIONAL THERAPY ORTHO EVALUATION  Patient Name: Travis [name] MRN: [id number] DOB:1960-12-07, 62 y.o., male Today's Date: 09/17/2022   END OF SESSION:  OT End of Session - 09/17/22 1138     Visit Number 1    Number of Visits 12    Date for OT Re-Evaluation 10/29/22    Authorization Type [employer] Employee-$40 copay    OT Start Time 1038    OT Stop Time 1109    OT Time Calculation (min) 31 min    Activity Tolerance Patient tolerated treatment well    Behavior During Therapy [facility] for tasks assessed/performed             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   Anxiety    Arthritis    Blood transfusion without reported diagnosis    Complication of anesthesia    bleed after last shoulder-had to stay overnight   Depression    Diabetes mellitus    Diabetic peripheral neuropathy (HCC) 03/28/2013   Diverticulosis    Dizziness    Hernia, inguinal    Hyperlipidemia    Hypertension    Impingement syndrome of left shoulder 08/02/2014   Ischemic colitis (HCC)    Multi-infarct state 10/14/2014   Neuropathy of lower extremity    Night sweats    every once in a while   Sleep apnea    no CPAP   Stroke (HCC) 02/2015   Syncope and collapse    Past Surgical History:  Procedure Laterality Date   BIOPSY  06/14/2019   Procedure: BIOPSY;  Surgeon: Sherrilyn Rist, MD;  Location: [facility];  Service: Gastroenterology;;   CERVICAL DISC SURGERY     COLON SURGERY  2003   1/3 removed for diverticulitis   COLONOSCOPY     COLONOSCOPY WITH PROPOFOL N/A 06/14/2019   Procedure: COLONOSCOPY WITH PROPOFOL;  Surgeon: Sherrilyn Rist, MD;  Location: [facility];  Service: Gastroenterology;  Laterality: N/A;   EP IMPLANTABLE DEVICE N/A 05/01/2015   Procedure: Loop Recorder Insertion;  Surgeon: Duke Salvia, MD;  Location: [facility] INVASIVE CV LAB;  Service: Cardiovascular;   Laterality: N/A;   INGUINAL HERNIA REPAIR Bilateral    KNEE ARTHROSCOPY Right    SHOULDER ARTHROSCOPY Right    1   SHOULDER ARTHROSCOPY Left 08/02/2014   Procedure: LEFT SHOULDER SCOPE DEBRIDEMENT/ACROMIOPLASTY;  Surgeon: Teryl Lucy, MD;  Location: [facility];  Service: Orthopedics;  Laterality: Left;  ANESTHESIA: GENERAL, PRE/POST OP SCALENE   TEE WITHOUT CARDIOVERSION N/A 03/03/2015   Procedure: TRANSESOPHAGEAL ECHOCARDIOGRAM (TEE);  Surgeon: Laqueta Linden, MD;  [facility]: [facility];  Service: Cardiology;  Laterality: N/A;   UMBILICAL HERNIA REPAIR     with other hernia repair with mesh   WRIST SURGERY Right    fusion   Patient Active Problem List   Diagnosis Date Noted   Hypercoagulable state due to paroxysmal atrial fibrillation (HCC) 07/19/2022   Nontraumatic incomplete tear of left rotator cuff 07/07/2022   Arthritis of left acromioclavicular joint 07/07/2022   Right groin hernia 03/03/2021   Hyperlipidemia associated with type 2 diabetes mellitus (HCC) 12/25/2020   Tendinopathy of right rotator cuff 09/25/2020   Arthrosis of right acromioclavicular joint 09/25/2020   Adrenal adenoma, left 09/07/2019   Chronic anticoagulation    GI bleed 06/14/2019   Vitamin D deficiency 04/18/2019   History of stroke 04/20/2018  GAD (generalized anxiety disorder) 05/15/2016   Acute bronchiolitis due to respiratory syncytial virus (RSV)    Acute CVA (cerebrovascular accident) (HCC) 05/04/2016   Dizziness 05/02/2016   Anxiety and depression 05/02/2016   Paroxysmal atrial fibrillation (HCC) 03/02/2016   PFO (patent foramen ovale) 07/24/2015   OSA (obstructive sleep apnea) 04/18/2015   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 artery (HCC) 04/18/2015   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 Lilyan Punt REFERRING PROVIDER: Jari Sportsman, PA-C  ONSET DATE: 09/01/22  REFERRING DIAG: S/P arthroscopy of left shoulder   THERAPY DIAG:  Acute pain of left shoulder  Other symptoms and signs involving the musculoskeletal system  Stiffness of left shoulder, not elsewhere classified  Rationale for Evaluation and Treatment: Rehabilitation  SUBJECTIVE:   SUBJECTIVE STATEMENT: S: "It feels like someone shot me with a 38."  Pt accompanied by: self  PERTINENT HISTORY: Pt is a 62 y/o male s/p S/P arthroscopy of left shoulder on 09/01/22. Pt presents without sling, reports he is no longer wearing.   PRECAUTIONS: Shoulder  WEIGHT BEARING RESTRICTIONS: Yes NWB  PAIN:  Are you having pain? Yes: NPRS scale: 7/10 Pain location: left shoulder Pain description: aching Aggravating factors: movement, reaching up Relieving factors: rest  FALLS: Has patient fallen in last 6 months? No  PLOF: Independent  PATIENT GOALS: To be able to use the LUE during ADLs.   NEXT MD VISIT: 10/12/22  OBJECTIVE:   HAND DOMINANCE: Right  ADLs: Overall ADLs: Pt is having difficulty with reaching up overhead, behind back. Pt is unable to sleep on the left side, wakes up at night due to pain. Pt is having difficulty with dressing, bathing and reaching head and back. Pt is unable to operate belt buckles.   FUNCTIONAL OUTCOME MEASURES: FOTO: 49/100   UPPER EXTREMITY ROM:       Assessed in supine, er/IR adducted  Passive ROM Left eval  Shoulder flexion 129  Shoulder abduction 113  Shoulder internal rotation 90   Shoulder external rotation 64  (Blank rows = not tested)    Assessed in seated, er/IR adducted    Active ROM Left eval  Shoulder flexion 81  Shoulder abduction 48  Shoulder internal rotation 90  Shoulder external rotation 62  (Blank rows = not tested)  UPPER EXTREMITY MMT:     Not tested due to precautions.   MMT Left eval  Shoulder flexion   Shoulder abduction   Shoulder internal rotation   Shoulder external rotation   (Blank rows = not tested)  SENSATION: Pt reports dullness in anterior shoulder region  EDEMA: None  COGNITION: Overall cognitive status: Within functional limits for tasks assessed  OBSERVATIONS: mod fascial restrictions along upper arm, anterior shoulder, and trapezius regions   TODAY'S TREATMENT:  DATE: N/A eval only     PATIENT EDUCATION: Education details: table slides Person educated: Patient Education method: Explanation, Demonstration, and Handouts Education comprehension: verbalized understanding and returned demonstration  HOME EXERCISE PROGRAM: Eval: table slides  GOALS: Goals reviewed with patient? Yes   SHORT TERM GOALS: Target date: 10/08/22  Pt will be provided with and educated on HEP to improve mobility in LUE required for use during ADL completion.   Goal status: INITIAL  2.  Pt will increase LUE P/ROM by 30 degrees or greater to improve ability to use LUE during dressing tasks with minimal compensatory techniques.   Goal status: INITIAL  3.  Pt will increase LUE strength to 3+/5 to improve ability to reach for items at waist to chest height during bathing and grooming tasks.   Goal status: INITIAL    LONG TERM GOALS: Target date: 10/29/22  Pt will decrease pain in LUE to 4/10 or less to improve ability to sleep for 2+ consecutive hours without waking due to pain.   Goal status:  INITIAL  2.  Pt will decrease LUE fascial restrictions to min amounts or less to improve mobility required for functional reaching tasks such as threading belt or reaching head.  Goal status: INITIAL  3.  Pt will increase LUE A/ROM to at least 140 degrees flexion and abduction, 60 er, to improve ability to use LUE when reaching overhead or behind back during dressing and bathing tasks.   Goal status: INITIAL  4.  Pt will increase LUE strength to 4/5 or greater to improve ability to use LUE when lifting or carrying items during meal preparation/housework/yardwork tasks.   Goal status: INITIAL  5.  Pt will return to highest level of function using LUE as non-dominant during functional task completion.   Goal status: INITIAL   ASSESSMENT:  CLINICAL IMPRESSION: Patient is a 62 y.o. male who was seen today for occupational therapy evaluation s/p left shoulder arthroscopy. Pt presents with increased pain and fascial restrictions, decreased ROM, strength, and functional use of the LUE. Pt presents without sling, reports he took it off and is using the LUE at a low level. Cautioned pt against lifting and reaching overhead right now.    PERFORMANCE DEFICITS: in functional skills including in functional skills including ADLs, IADLs, coordination, tone, ROM, strength, pain, fascial restrictions, muscle spasms, and UE functional use  IMPAIRMENTS: are limiting patient from ADLs, IADLs, rest and sleep, and leisure.   COMORBIDITIES: has no other co-morbidities that affects occupational performance. Patient will benefit from skilled OT to address above impairments and improve overall function.  MODIFICATION OR ASSISTANCE TO COMPLETE EVALUATION: No modification of tasks or assist necessary to complete an evaluation.  OT OCCUPATIONAL PROFILE AND HISTORY: Problem focused assessment: Including review of records relating to presenting problem.  CLINICAL DECISION MAKING: LOW - limited treatment options,  no task modification necessary  REHAB POTENTIAL: Good  EVALUATION COMPLEXITY: Low      PLAN:  OT FREQUENCY: 2x/week  OT DURATION: 6 weeks  PLANNED INTERVENTIONS: self care/ADL training, therapeutic exercise, therapeutic activity, neuromuscular re-education, manual therapy, passive range of motion, splinting, electrical stimulation, ultrasound, moist heat, cryotherapy, patient/family education, and DME and/or AE instructions  RECOMMENDED OTHER SERVICES: None  CONSULTED AND AGREED WITH PLAN OF CARE: Patient  PLAN FOR NEXT SESSION: Follow up on HEP, see protocol and begin manual techniques, passive stretching, progressing to AA/ROM as allowed   [employer], OTR/L  [phone] 09/17/2022, 11:39 AM

## 2022-09-17 NOTE — Telephone Encounter (Signed)
What ever protocol to use upstairs for shoulder scope and debridement.  No rotator cuff repair.

## 2022-09-20 NOTE — Telephone Encounter (Signed)
Faxed protocol per Judeth Cornfield to number listed below in message.

## 2022-09-22 ENCOUNTER — Other Ambulatory Visit (HOSPITAL_COMMUNITY): Payer: Self-pay

## 2022-09-23 ENCOUNTER — Other Ambulatory Visit (HOSPITAL_COMMUNITY): Payer: Self-pay

## 2022-09-24 ENCOUNTER — Ambulatory Visit (HOSPITAL_COMMUNITY): Payer: 59 | Admitting: Occupational Therapy

## 2022-09-24 ENCOUNTER — Encounter (HOSPITAL_COMMUNITY): Payer: Self-pay | Admitting: Occupational Therapy

## 2022-09-24 DIAGNOSIS — M25512 Pain in left shoulder: Secondary | ICD-10-CM | POA: Diagnosis not present

## 2022-09-24 DIAGNOSIS — R29898 Other symptoms and signs involving the musculoskeletal system: Secondary | ICD-10-CM

## 2022-09-24 DIAGNOSIS — Z9889 Other specified postprocedural states: Secondary | ICD-10-CM | POA: Diagnosis not present

## 2022-09-24 DIAGNOSIS — M25612 Stiffness of left shoulder, not elsewhere classified: Secondary | ICD-10-CM

## 2022-09-24 NOTE — Patient Instructions (Signed)

## 2022-09-24 NOTE — Therapy (Signed)
OUTPATIENT OCCUPATIONAL THERAPY ORTHO TREATMENT  Patient Name: KIREE BROD MRN: [id number] DOB:11/13/1960, 62 y.o., male Today's Date: 09/24/2022   END OF SESSION:  OT End of Session - 09/24/22 1151     Visit Number 2    Number of Visits 12    Date for OT Re-Evaluation 10/29/22    Authorization Type [employer] Employee-$40 copay    OT Start Time 1113    OT Stop Time 1154    OT Time Calculation (min) 41 min    Activity Tolerance Patient tolerated treatment well    Behavior During Therapy [facility] for tasks assessed/performed              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   Anxiety    Arthritis    Blood transfusion without reported diagnosis    Complication of anesthesia    bleed after last shoulder-had to stay overnight   Depression    Diabetes mellitus    Diabetic peripheral neuropathy (HCC) 03/28/2013   Diverticulosis    Dizziness    Hernia, inguinal    Hyperlipidemia    Hypertension    Impingement syndrome of left shoulder 08/02/2014   Ischemic colitis (HCC)    Multi-infarct state 10/14/2014   Neuropathy of lower extremity    Night sweats    every once in a while   Sleep apnea    no CPAP   Stroke (HCC) 02/2015   Syncope and collapse    Past Surgical History:  Procedure Laterality Date   BIOPSY  06/14/2019   Procedure: BIOPSY;  Surgeon: Sherrilyn Rist, MD;  Location: [facility];  Service: Gastroenterology;;   CERVICAL DISC SURGERY     COLON SURGERY  2003   1/3 removed for diverticulitis   COLONOSCOPY     COLONOSCOPY WITH PROPOFOL N/A 06/14/2019   Procedure: COLONOSCOPY WITH PROPOFOL;  Surgeon: Sherrilyn Rist, MD;  Location: [facility];  Service: Gastroenterology;  Laterality: N/A;   EP IMPLANTABLE DEVICE N/A 05/01/2015   Procedure: Loop Recorder Insertion;  Surgeon: Duke Salvia, MD;  Location: [facility] INVASIVE CV LAB;  Service: Cardiovascular;   Laterality: N/A;   INGUINAL HERNIA REPAIR Bilateral    KNEE ARTHROSCOPY Right    SHOULDER ARTHROSCOPY Right    1   SHOULDER ARTHROSCOPY Left 08/02/2014   Procedure: LEFT SHOULDER SCOPE DEBRIDEMENT/ACROMIOPLASTY;  Surgeon: Teryl Lucy, MD;  Location: [facility];  Service: Orthopedics;  Laterality: Left;  ANESTHESIA: GENERAL, PRE/POST OP SCALENE   TEE WITHOUT CARDIOVERSION N/A 03/03/2015   Procedure: TRANSESOPHAGEAL ECHOCARDIOGRAM (TEE);  Surgeon: Laqueta Linden, MD;  [facility];  Service: Cardiology;  Laterality: N/A;   UMBILICAL HERNIA REPAIR     with other hernia repair with mesh   WRIST SURGERY Right    fusion   Patient Active Problem List   Diagnosis Date Noted   Hypercoagulable state due to paroxysmal atrial fibrillation (HCC) 07/19/2022   Nontraumatic incomplete tear of left rotator cuff 07/07/2022   Arthritis of left acromioclavicular joint 07/07/2022   Right groin hernia 03/03/2021   Hyperlipidemia associated with type 2 diabetes mellitus (HCC) 12/25/2020   Tendinopathy of right rotator cuff 09/25/2020   Arthrosis of right acromioclavicular joint 09/25/2020   Adrenal adenoma, left 09/07/2019   Chronic anticoagulation    GI bleed 06/14/2019   Vitamin D deficiency 04/18/2019   History of stroke  04/20/2018   GAD (generalized anxiety disorder) 05/15/2016   Acute bronchiolitis due to respiratory syncytial virus (RSV)    Acute CVA (cerebrovascular accident) (HCC) 05/04/2016   Dizziness 05/02/2016   Anxiety and depression 05/02/2016   Paroxysmal atrial fibrillation (HCC) 03/02/2016   PFO (patent foramen ovale) 07/24/2015   OSA (obstructive sleep apnea) 04/18/2015   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 artery (HCC) 04/18/2015   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 Lilyan Punt REFERRING PROVIDER: Jari Sportsman, PA-C  ONSET DATE: 09/01/22  REFERRING DIAG: S/P arthroscopy of left shoulder   THERAPY DIAG:  Acute pain of left shoulder  Other symptoms and signs involving the musculoskeletal system  Stiffness of left shoulder, not elsewhere classified  Rationale for Evaluation and Treatment: Rehabilitation  SUBJECTIVE:   SUBJECTIVE STATEMENT: S: "It's feeling groovy today."   PERTINENT HISTORY: Pt is a 62 y/o male s/p S/P arthroscopy of left shoulder on 09/01/22. Pt presents without sling, reports he is no longer wearing.   PRECAUTIONS: Shoulder-AA/ROM progress as tolerated.   WEIGHT BEARING RESTRICTIONS: Yes NWB  PAIN:  Are you having pain? Yes: NPRS scale: 7/10 Pain location: left shoulder Pain description: aching Aggravating factors: movement, reaching up Relieving factors: rest  FALLS: Has patient fallen in last 6 months? No  PATIENT GOALS: To be able to use the LUE during ADLs.   NEXT MD VISIT: 10/12/22  OBJECTIVE:   HAND DOMINANCE: Right  ADLs: Overall ADLs: Pt is having difficulty with reaching up overhead, behind back. Pt is unable to sleep on the left side, wakes up at night due to pain. Pt is having difficulty with dressing, bathing and reaching head and back. Pt is unable to operate belt buckles.   FUNCTIONAL OUTCOME MEASURES: FOTO: 49/100   UPPER EXTREMITY ROM:       Assessed in supine, er/IR adducted  Passive ROM Left eval  Shoulder flexion 129  Shoulder abduction 113  Shoulder internal rotation 90  Shoulder external rotation 64   (Blank rows = not tested)    Assessed in seated, er/IR adducted    Active ROM Left eval  Shoulder flexion 81  Shoulder abduction 48  Shoulder internal rotation 90  Shoulder external rotation 62  (Blank rows = not tested)  UPPER EXTREMITY MMT:     Not tested due to precautions.   MMT Left eval  Shoulder flexion   Shoulder abduction   Shoulder internal rotation   Shoulder external rotation   (Blank rows = not tested)  SENSATION: Pt reports dullness in anterior shoulder region  OBSERVATIONS: mod fascial restrictions along upper arm, anterior shoulder, and trapezius regions   TODAY'S TREATMENT:  DATE: 09/24/22 -P/ROM: flexion, abduction, er/IR, horizontal abduction, 5 reps -AA/ROM: protraction, flexion, abduction, er/IR, horizontal abduction, 15 reps -Proximal shoulder strengthening: supine-paddles, criss cross, circles each direction, 10 reps each -AA/ROM: protraction, flexion, abduction, er/IR, horizontal abduction, 12 reps -Wall wash: 1' flexion -Thumb tacks: 1' flexion, mod difficulty  -Scapular A/ROM: row, extension, retraction, 10 reps   PATIENT EDUCATION: Education details: AA/ROM Person educated: Patient Education method: Programmer, multimedia, Facilities manager, and Handouts Education comprehension: verbalized understanding and returned demonstration  HOME EXERCISE PROGRAM: Eval: table slides 5/24: AA/ROM  GOALS: Goals reviewed with patient? Yes   SHORT TERM GOALS: Target date: 10/08/22  Pt will be provided with and educated on HEP to improve mobility in LUE required for use during ADL completion.   Goal status: IN PROGRESS  2.  Pt will increase LUE P/ROM by 30 degrees or greater to improve ability to use LUE during dressing tasks with minimal compensatory techniques.   Goal status: IN PROGRESS  3.  Pt will increase LUE strength to 3+/5 to  improve ability to reach for items at waist to chest height during bathing and grooming tasks.   Goal status: IN PROGRESS    LONG TERM GOALS: Target date: 10/29/22  Pt will decrease pain in LUE to 4/10 or less to improve ability to sleep for 2+ consecutive hours without waking due to pain.   Goal status: IN PROGRESS  2.  Pt will decrease LUE fascial restrictions to min amounts or less to improve mobility required for functional reaching tasks such as threading belt or reaching head.  Goal status: IN PROGRESS  3.  Pt will increase LUE A/ROM to at least 140 degrees flexion and abduction, 60 er, to improve ability to use LUE when reaching overhead or behind back during dressing and bathing tasks.   Goal status: IN PROGRESS  4.  Pt will increase LUE strength to 4/5 or greater to improve ability to use LUE when lifting or carrying items during meal preparation/housework/yardwork tasks.   Goal status: IN PROGRESS  5.  Pt will return to highest level of function using LUE as non-dominant during functional task completion.   Goal status: IN PROGRESS   ASSESSMENT:  CLINICAL IMPRESSION: Patient reports he has been completing his HEP. Pt with minimal fascial restrictions, therefore manual techniques not indicated today. Passive stretching and AA/ROM completed with pt achieving ROM WFL at 80% today, full range for er. Proximal shoulder strengthening completed for shoulder stability work, mod difficulty with coordination. Pt requiring redirection to tasks, verbal cuing for form and technique.   PERFORMANCE DEFICITS: in functional skills including in functional skills including ADLs, IADLs, coordination, tone, ROM, strength, pain, fascial restrictions, muscle spasms, and UE functional use    PLAN:  OT FREQUENCY: 2x/week  OT DURATION: 6 weeks  PLANNED INTERVENTIONS: self care/ADL training, therapeutic exercise, therapeutic activity, neuromuscular re-education, manual therapy, passive  range of motion, splinting, electrical stimulation, ultrasound, moist heat, cryotherapy, patient/family education, and DME and/or AE instructions  CONSULTED AND AGREED WITH PLAN OF CARE: Patient  PLAN FOR NEXT SESSION: Follow up on HEP, passive stretching, progressing to A/ROM as able   Ezra Sites, OTR/L  [phone] 09/24/2022, 11:54 AM

## 2022-10-01 ENCOUNTER — Encounter (HOSPITAL_COMMUNITY): Payer: Self-pay | Admitting: Occupational Therapy

## 2022-10-01 ENCOUNTER — Other Ambulatory Visit: Payer: Self-pay | Admitting: Physician Assistant

## 2022-10-01 ENCOUNTER — Ambulatory Visit (HOSPITAL_COMMUNITY): Payer: 59 | Admitting: Occupational Therapy

## 2022-10-01 ENCOUNTER — Other Ambulatory Visit: Payer: Self-pay

## 2022-10-01 ENCOUNTER — Other Ambulatory Visit (HOSPITAL_COMMUNITY): Payer: Self-pay

## 2022-10-01 ENCOUNTER — Other Ambulatory Visit: Payer: Self-pay | Admitting: Nurse Practitioner

## 2022-10-01 DIAGNOSIS — M25512 Pain in left shoulder: Secondary | ICD-10-CM

## 2022-10-01 DIAGNOSIS — R29898 Other symptoms and signs involving the musculoskeletal system: Secondary | ICD-10-CM | POA: Diagnosis not present

## 2022-10-01 DIAGNOSIS — M25612 Stiffness of left shoulder, not elsewhere classified: Secondary | ICD-10-CM

## 2022-10-01 DIAGNOSIS — Z9889 Other specified postprocedural states: Secondary | ICD-10-CM | POA: Diagnosis not present

## 2022-10-01 NOTE — Patient Instructions (Signed)
1) ROM: Abduction (Standing)   Bring arms straight out from sides and raise as high as possible without pain. Repeat _10___ times per set. Do __1__ sets per session. Do _2___ sessions per day.  http://orth.exer.us/910   Copyright  VHI. All rights reserved.   2) Extension (Active) ROM: Extension (Standing)   Bring arms straight back as far as possible without pain. Repeat __10__ times per set. Do __1__ sets per session. Do _2___ sessions per day.  http://orth.exer.us/916   Copyright  VHI. All rights reserved.   3) ROM: External / Internal Rotation - in Abduction (Standing)   With upper arms parallel to floor and elbows bent at right angles, gently rotate arms up then down as far as possible without pain. Repeat _10___ times per set. Do __1__ sets per session. Do _2___ sessions per day.  http://orth.exer.us/912   Copyright  VHI. All rights reserved.    4) Flexors Stretch (Active)   Stand, arms straight at sides. Bring arms straight forward and upward as high as possible without pain. Hold __10_ seconds. Repeat _10__ times per session. Do _2__ sessions per day.  Copyright  VHI. All rights reserved.   5) Scapular Retraction (Standing)   With arms at sides, pinch shoulder blades together. Repeat _10___ times per set. Do __1__ sets per session. Do _2___ sessions per day.  http://orth.exer.us/944   Copyright  VHI. All rights reserved.

## 2022-10-01 NOTE — Therapy (Signed)
OUTPATIENT OCCUPATIONAL THERAPY ORTHO TREATMENT  Patient Name: Travis [name] MRN: [id number] DOB:08/02/1960, 62 y.o., male Today's Date: 10/01/2022   END OF SESSION:  OT End of Session - 10/01/22 1153     Visit Number 3    Number of Visits 12    Date for OT Re-Evaluation 10/29/22    Authorization Type [employer] Employee-$40 copay    OT Start Time 1112    OT Stop Time 1152    OT Time Calculation (min) 40 min    Activity Tolerance Patient tolerated treatment well    Behavior During Therapy [facility] for tasks assessed/performed               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   Anxiety    Arthritis    Blood transfusion without reported diagnosis    Complication of anesthesia    bleed after last shoulder-had to stay overnight   Depression    Diabetes mellitus    Diabetic peripheral neuropathy (HCC) 03/28/2013   Diverticulosis    Dizziness    Hernia, inguinal    Hyperlipidemia    Hypertension    Impingement syndrome of left shoulder 08/02/2014   Ischemic colitis (HCC)    Multi-infarct state 10/14/2014   Neuropathy of lower extremity    Night sweats    every once in a while   Sleep apnea    no CPAP   Stroke (HCC) 02/2015   Syncope and collapse    Past Surgical History:  Procedure Laterality Date   BIOPSY  06/14/2019   Procedure: BIOPSY;  Surgeon: Sherrilyn Rist, MD;  Location: [facility];  Service: Gastroenterology;;   CERVICAL DISC SURGERY     COLON SURGERY  2003   1/3 removed for diverticulitis   COLONOSCOPY     COLONOSCOPY WITH PROPOFOL N/A 06/14/2019   Procedure: COLONOSCOPY WITH PROPOFOL;  Surgeon: Sherrilyn Rist, MD;  Location: [facility];  Service: Gastroenterology;  Laterality: N/A;   EP IMPLANTABLE DEVICE N/A 05/01/2015   Procedure: Loop Recorder Insertion;  Surgeon: Duke Salvia, MD;  Location: [facility];  Service:  Cardiovascular;  Laterality: N/A;   INGUINAL HERNIA REPAIR Bilateral    KNEE ARTHROSCOPY Right    SHOULDER ARTHROSCOPY Right    1   SHOULDER ARTHROSCOPY Left 08/02/2014   Procedure: LEFT SHOULDER SCOPE DEBRIDEMENT/ACROMIOPLASTY;  Surgeon: Teryl Lucy, MD;  Location: [facility];  Service: Orthopedics;  Laterality: Left;  ANESTHESIA: GENERAL, PRE/POST OP SCALENE   TEE WITHOUT CARDIOVERSION N/A 03/03/2015   Procedure: TRANSESOPHAGEAL ECHOCARDIOGRAM (TEE);  Surgeon: Laqueta Linden, MD;  [facility];  Service: Cardiology;  Laterality: N/A;   UMBILICAL HERNIA REPAIR     with other hernia repair with mesh   WRIST SURGERY Right    fusion   Patient Active Problem List   Diagnosis Date Noted   Hypercoagulable state due to paroxysmal atrial fibrillation (HCC) 07/19/2022   Nontraumatic incomplete tear of left rotator cuff 07/07/2022   Arthritis of left acromioclavicular joint 07/07/2022   Right groin hernia 03/03/2021   Hyperlipidemia associated with type 2 diabetes mellitus (HCC) 12/25/2020   Tendinopathy of right rotator cuff 09/25/2020   Arthrosis of right acromioclavicular joint 09/25/2020   Adrenal adenoma, left 09/07/2019   Chronic anticoagulation    GI bleed 06/14/2019   Vitamin D deficiency 04/18/2019   History of  stroke 04/20/2018   GAD (generalized anxiety disorder) 05/15/2016   Acute bronchiolitis due to respiratory syncytial virus (RSV)    Acute CVA (cerebrovascular accident) (HCC) 05/04/2016   Dizziness 05/02/2016   Anxiety and depression 05/02/2016   Paroxysmal atrial fibrillation (HCC) 03/02/2016   PFO (patent foramen ovale) 07/24/2015   OSA (obstructive sleep apnea) 04/18/2015   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 artery (HCC) 04/18/2015   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 Lilyan Punt REFERRING PROVIDER: Jari Sportsman, PA-C  ONSET DATE: 09/01/22  REFERRING DIAG: S/P arthroscopy of left shoulder   THERAPY DIAG:  Acute pain of left shoulder  Other symptoms and signs involving the musculoskeletal system  Stiffness of left shoulder, not elsewhere classified  Rationale for Evaluation and Treatment: Rehabilitation  SUBJECTIVE:   SUBJECTIVE STATEMENT: S: "It's feeling groovy today."   PERTINENT HISTORY: Pt is a 62 y/o male s/p S/P arthroscopy of left shoulder on 09/01/22. Pt presents without sling, reports he is no longer wearing.   PRECAUTIONS: Shoulder-AA/ROM progress as tolerated.   WEIGHT BEARING RESTRICTIONS: Yes NWB  PAIN:  Are you having pain? Yes: NPRS scale: 6/10 Pain location: left shoulder Pain description: aching Aggravating factors: movement, reaching up Relieving factors: rest  FALLS: Has patient fallen in last 6 months? No  PATIENT GOALS: To be able to use the LUE during ADLs.   NEXT MD VISIT: 10/12/22  OBJECTIVE:   HAND DOMINANCE: Right  ADLs: Overall ADLs: Pt is having difficulty with reaching up overhead, behind back. Pt is unable to sleep on the left side, wakes up at night due to pain. Pt is having difficulty with dressing, bathing and reaching head and back. Pt is unable to operate belt buckles.   FUNCTIONAL OUTCOME MEASURES: FOTO: 49/100   UPPER EXTREMITY ROM:       Assessed in supine, er/IR adducted  Passive ROM Left eval  Shoulder flexion 129  Shoulder abduction 113  Shoulder internal rotation 90  Shoulder  external rotation 64  (Blank rows = not tested)    Assessed in seated, er/IR adducted    Active ROM Left eval  Shoulder flexion 81  Shoulder abduction 48  Shoulder internal rotation 90  Shoulder external rotation 62  (Blank rows = not tested)  UPPER EXTREMITY MMT:     Not tested due to precautions.   MMT Left eval  Shoulder flexion   Shoulder abduction   Shoulder internal rotation   Shoulder external rotation   (Blank rows = not tested)  SENSATION: Pt reports dullness in anterior shoulder region  OBSERVATIONS: mod fascial restrictions along upper arm, anterior shoulder, and trapezius regions   TODAY'S TREATMENT:  DATE:  10/01/22 -Manual techniques: noted increased fascial restrictions in bicep and trapezius, manual techniques completed to decrease pain and improve ROM  -P/ROM: flexion, abduction, er/IR, horizontal abduction, 5 reps -AA/ROM: flexion, abduction, er/IR, horizontal abduction, 10 reps -A/ROM: supine; flexion, abduction, er/IR, horizontal abduction, 5 reps -Proximal shoulder strengthening: supine-paddles, criss cross, circles each direction, 10 reps each -Scapular A/ROM: row, extension, retraction, 10 reps  09/24/22 -P/ROM: flexion, abduction, er/IR, horizontal abduction, 5 reps -AA/ROM: protraction, flexion, abduction, er/IR, horizontal abduction, 15 reps -Proximal shoulder strengthening: supine-paddles, criss cross, circles each direction, 10 reps each -AA/ROM: protraction, flexion, abduction, er/IR, horizontal abduction, 12 reps -Wall wash: 1' flexion -Thumb tacks: 1' flexion, mod difficulty  -Scapular A/ROM: row, extension, retraction, 10 reps   PATIENT EDUCATION: Education details: AA/ROM Person educated: Patient Education method: Programmer, multimedia, Facilities manager, and Handouts Education comprehension: verbalized understanding and  returned demonstration  HOME EXERCISE PROGRAM: Eval: table slides 5/24: AA/ROM 5/31: A/ROM   GOALS: Goals reviewed with patient? Yes   SHORT TERM GOALS: Target date: 10/08/22  Pt will be provided with and educated on HEP to improve mobility in LUE required for use during ADL completion.   Goal status: IN PROGRESS  2.  Pt will increase LUE P/ROM by 30 degrees or greater to improve ability to use LUE during dressing tasks with minimal compensatory techniques.   Goal status: IN PROGRESS  3.  Pt will increase LUE strength to 3+/5 to improve ability to reach for items at waist to chest height during bathing and grooming tasks.   Goal status: IN PROGRESS    LONG TERM GOALS: Target date: 10/29/22  Pt will decrease pain in LUE to 4/10 or less to improve ability to sleep for 2+ consecutive hours without waking due to pain.   Goal status: IN PROGRESS  2.  Pt will decrease LUE fascial restrictions to min amounts or less to improve mobility required for functional reaching tasks such as threading belt or reaching head.  Goal status: IN PROGRESS  3.  Pt will increase LUE A/ROM to at least 140 degrees flexion and abduction, 60 er, to improve ability to use LUE when reaching overhead or behind back during dressing and bathing tasks.   Goal status: IN PROGRESS  4.  Pt will increase LUE strength to 4/5 or greater to improve ability to use LUE when lifting or carrying items during meal preparation/housework/yardwork tasks.   Goal status: IN PROGRESS  5.  Pt will return to highest level of function using LUE as non-dominant during functional task completion.   Goal status: IN PROGRESS   ASSESSMENT:  CLINICAL IMPRESSION: Patient with increased fascial restrictions to trapezius, began session with manual techniques to decrease fascial restrictions and decrease pain. He reports slightly less pain during the day and using an ice pack as pain reliever. Continued with AA/ROM and proximal  shoulder stability exercises. Discussed starting A/ROM at home. VC throughout session for form and body position.   PERFORMANCE DEFICITS: in functional skills including in functional skills including ADLs, IADLs, coordination, tone, ROM, strength, pain, fascial restrictions, muscle spasms, and UE functional use    PLAN:  OT FREQUENCY: 2x/week  OT DURATION: 6 weeks  PLANNED INTERVENTIONS: self care/ADL training, therapeutic exercise, therapeutic activity, neuromuscular re-education, manual therapy, passive range of motion, splinting, electrical stimulation, ultrasound, moist heat, cryotherapy, patient/family education, and DME and/or AE instructions  CONSULTED AND AGREED WITH PLAN OF CARE: Patient  PLAN FOR NEXT SESSION: Follow up on HEP, passive stretching, progressing to A/ROM as  able   Lurena Joiner, OTR/L  [phone] 10/01/2022, 11:54 AM

## 2022-10-02 ENCOUNTER — Other Ambulatory Visit (HOSPITAL_COMMUNITY): Payer: Self-pay

## 2022-10-04 ENCOUNTER — Other Ambulatory Visit (HOSPITAL_COMMUNITY): Payer: Self-pay

## 2022-10-05 ENCOUNTER — Encounter: Payer: 59 | Admitting: Orthopedic Surgery

## 2022-10-06 ENCOUNTER — Ambulatory Visit (HOSPITAL_COMMUNITY): Payer: 59 | Attending: Physician Assistant | Admitting: Occupational Therapy

## 2022-10-06 ENCOUNTER — Encounter (HOSPITAL_COMMUNITY): Payer: Self-pay | Admitting: Occupational Therapy

## 2022-10-06 DIAGNOSIS — M25512 Pain in left shoulder: Secondary | ICD-10-CM | POA: Insufficient documentation

## 2022-10-06 DIAGNOSIS — M25612 Stiffness of left shoulder, not elsewhere classified: Secondary | ICD-10-CM | POA: Diagnosis not present

## 2022-10-06 DIAGNOSIS — R29898 Other symptoms and signs involving the musculoskeletal system: Secondary | ICD-10-CM | POA: Diagnosis not present

## 2022-10-06 NOTE — Therapy (Signed)
OUTPATIENT OCCUPATIONAL THERAPY ORTHO TREATMENT  Patient Name: Travis [name] MRN: [id number] DOB:18-May-1960, 62 y.o., male Today's Date: 10/06/2022   END OF SESSION:  OT End of Session - 10/06/22 1336     Visit Number 4    Number of Visits 12    Date for OT Re-Evaluation 10/29/22    Authorization Type [employer] Employee-$40 copay    OT Start Time 1259    OT Stop Time 1339    OT Time Calculation (min) 40 min    Activity Tolerance Patient tolerated treatment well    Behavior During Therapy [facility] for tasks assessed/performed                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   Anxiety    Arthritis    Blood transfusion without reported diagnosis    Complication of anesthesia    bleed after last shoulder-had to stay overnight   Depression    Diabetes mellitus    Diabetic peripheral neuropathy (HCC) 03/28/2013   Diverticulosis    Dizziness    Hernia, inguinal    Hyperlipidemia    Hypertension    Impingement syndrome of left shoulder 08/02/2014   Ischemic colitis (HCC)    Multi-infarct state 10/14/2014   Neuropathy of lower extremity    Night sweats    every once in a while   Sleep apnea    no CPAP   Stroke (HCC) 02/2015   Syncope and collapse    Past Surgical History:  Procedure Laterality Date   BIOPSY  06/14/2019   Procedure: BIOPSY;  Surgeon: Sherrilyn Rist, MD;  Location: [facility];  Service: Gastroenterology;;   CERVICAL DISC SURGERY     COLON SURGERY  2003   1/3 removed for diverticulitis   COLONOSCOPY     COLONOSCOPY WITH PROPOFOL N/A 06/14/2019   Procedure: COLONOSCOPY WITH PROPOFOL;  Surgeon: Sherrilyn Rist, MD;  Location: [facility];  Service: Gastroenterology;  Laterality: N/A;   EP IMPLANTABLE DEVICE N/A 05/01/2015   Procedure: Loop Recorder Insertion;  Surgeon: Duke Salvia, MD;  Location: [facility];  Service:  Cardiovascular;  Laterality: N/A;   INGUINAL HERNIA REPAIR Bilateral    KNEE ARTHROSCOPY Right    SHOULDER ARTHROSCOPY Right    1   SHOULDER ARTHROSCOPY Left 08/02/2014   Procedure: LEFT SHOULDER SCOPE DEBRIDEMENT/ACROMIOPLASTY;  Surgeon: Teryl Lucy, MD;  Location: [facility];  Service: Orthopedics;  Laterality: Left;  ANESTHESIA: GENERAL, PRE/POST OP SCALENE   TEE WITHOUT CARDIOVERSION N/A 03/03/2015   Procedure: TRANSESOPHAGEAL ECHOCARDIOGRAM (TEE);  Surgeon: Laqueta Linden, MD;  [facility];  Service: Cardiology;  Laterality: N/A;   UMBILICAL HERNIA REPAIR     with other hernia repair with mesh   WRIST SURGERY Right    fusion   Patient Active Problem List   Diagnosis Date Noted   Hypercoagulable state due to paroxysmal atrial fibrillation (HCC) 07/19/2022   Nontraumatic incomplete tear of left rotator cuff 07/07/2022   Arthritis of left acromioclavicular joint 07/07/2022   Right groin hernia 03/03/2021   Hyperlipidemia associated with type 2 diabetes mellitus (HCC) 12/25/2020   Tendinopathy of right rotator cuff 09/25/2020   Arthrosis of right acromioclavicular joint 09/25/2020   Adrenal adenoma, left 09/07/2019   Chronic anticoagulation    GI bleed 06/14/2019   Vitamin D deficiency 04/18/2019   History  of stroke 04/20/2018   GAD (generalized anxiety disorder) 05/15/2016   Acute bronchiolitis due to respiratory syncytial virus (RSV)    Acute CVA (cerebrovascular accident) (HCC) 05/04/2016   Dizziness 05/02/2016   Anxiety and depression 05/02/2016   Paroxysmal atrial fibrillation (HCC) 03/02/2016   PFO (patent foramen ovale) 07/24/2015   OSA (obstructive sleep apnea) 04/18/2015   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 artery (HCC) 04/18/2015   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 Lilyan Punt REFERRING PROVIDER: Jari Sportsman, PA-C  ONSET DATE: 09/01/22  REFERRING DIAG: S/P arthroscopy of left shoulder   THERAPY DIAG:  Acute pain of left shoulder  Other symptoms and signs involving the musculoskeletal system  Stiffness of left shoulder, not elsewhere classified  Rationale for Evaluation and Treatment: Rehabilitation  SUBJECTIVE:   SUBJECTIVE STATEMENT: S: "It's been feeling better."   PERTINENT HISTORY: Pt is a 62 y/o male s/p S/P arthroscopy of left shoulder on 09/01/22. Pt presents without sling, reports he is no longer wearing.   PRECAUTIONS: Shoulder-AA/ROM progress as tolerated.   WEIGHT BEARING RESTRICTIONS: Yes NWB  PAIN:  Are you having pain? Yes: NPRS scale: 5/10 Pain location: left shoulder Pain description: aching Aggravating factors: movement, reaching up Relieving factors: rest  FALLS: Has patient fallen in last 6 months? No  PATIENT GOALS: To be able to use the LUE during ADLs.   NEXT MD VISIT: 10/12/22  OBJECTIVE:   HAND DOMINANCE: Right  ADLs: Overall ADLs: Pt is having difficulty with reaching up overhead, behind back. Pt is unable to sleep on the left side, wakes up at night due to pain. Pt is having difficulty with dressing, bathing and reaching head and back. Pt is unable to operate belt buckles.   FUNCTIONAL OUTCOME MEASURES: FOTO: 49/100   UPPER EXTREMITY ROM:       Assessed in supine, er/IR adducted  Passive ROM Left eval  Shoulder flexion 129  Shoulder abduction 113  Shoulder internal rotation 90  Shoulder external  rotation 64  (Blank rows = not tested)    Assessed in seated, er/IR adducted    Active ROM Left eval  Shoulder flexion 81  Shoulder abduction 48  Shoulder internal rotation 90  Shoulder external rotation 62  (Blank rows = not tested)  UPPER EXTREMITY MMT:     Not tested due to precautions.   MMT Left eval  Shoulder flexion   Shoulder abduction   Shoulder internal rotation   Shoulder external rotation   (Blank rows = not tested)  SENSATION: Pt reports dullness in anterior shoulder region  OBSERVATIONS: mod fascial restrictions along upper arm, anterior shoulder, and trapezius regions   TODAY'S TREATMENT:  DATE:  10/06/22 -P/ROM: flexion, abduction, er/IR, horizontal abduction, 5 reps -A/ROM: supine-protraction, flexion, abduction, er/IR, horizontal abduction, 10 reps -Proximal shoulder strengthening: supine-paddles, criss cross, circles each direction, 10 reps each -A/ROM: standing-protraction, flexion, er/IR, horizontal abduction, 10 reps -AA/ROM: standing-abduction, 10 reps -X to V arms: 10 reps -Proximal shoulder strengthening: standing-paddles, criss cross, circles each direction, 10 reps each -Wall wash: 1' flexion -Proximal shoulder strengthening on doorway, shoulder at 90 degrees flexion, 1' with 1 rest break -Scapular theraband: red-row, extension, retraction, 10 reps each -Functional Reaching: pt standing at cabinets placing 10 cones on top shelf in flexion, removing in abduction -UBE: Level 1, 3' forward 2' reverse, pace: 7.5  10/01/22 -Manual techniques: noted increased fascial restrictions in bicep and trapezius, manual techniques completed to decrease pain and improve ROM  -P/ROM: flexion, abduction, er/IR, horizontal abduction, 5 reps -AA/ROM: flexion, abduction, er/IR, horizontal abduction, 10 reps -A/ROM: supine; flexion,  abduction, er/IR, horizontal abduction, 5 reps -Proximal shoulder strengthening: supine-paddles, criss cross, circles each direction, 10 reps each -Scapular A/ROM: row, extension, retraction, 10 reps  09/24/22 -P/ROM: flexion, abduction, er/IR, horizontal abduction, 5 reps -AA/ROM: protraction, flexion, abduction, er/IR, horizontal abduction, 15 reps -Proximal shoulder strengthening: supine-paddles, criss cross, circles each direction, 10 reps each -AA/ROM: protraction, flexion, abduction, er/IR, horizontal abduction, 12 reps -Wall wash: 1' flexion -Thumb tacks: 1' flexion, mod difficulty  -Scapular A/ROM: row, extension, retraction, 10 reps   PATIENT EDUCATION: Education details: reviewed importance of HEP completion Person educated: Patient Education method: Explanation, Demonstration, and Handouts Education comprehension: verbalized understanding and returned demonstration  HOME EXERCISE PROGRAM: Eval: table slides 5/24: AA/ROM 5/31: A/ROM   GOALS: Goals reviewed with patient? Yes   SHORT TERM GOALS: Target date: 10/08/22  Pt will be provided with and educated on HEP to improve mobility in LUE required for use during ADL completion.   Goal status: IN PROGRESS  2.  Pt will increase LUE P/ROM by 30 degrees or greater to improve ability to use LUE during dressing tasks with minimal compensatory techniques.   Goal status: IN PROGRESS  3.  Pt will increase LUE strength to 3+/5 to improve ability to reach for items at waist to chest height during bathing and grooming tasks.   Goal status: IN PROGRESS    LONG TERM GOALS: Target date: 10/29/22  Pt will decrease pain in LUE to 4/10 or less to improve ability to sleep for 2+ consecutive hours without waking due to pain.   Goal status: IN PROGRESS  2.  Pt will decrease LUE fascial restrictions to min amounts or less to improve mobility required for functional reaching tasks such as threading belt or reaching head.  Goal  status: IN PROGRESS  3.  Pt will increase LUE A/ROM to at least 140 degrees flexion and abduction, 60 er, to improve ability to use LUE when reaching overhead or behind back during dressing and bathing tasks.   Goal status: IN PROGRESS  4.  Pt will increase LUE strength to 4/5 or greater to improve ability to use LUE when lifting or carrying items during meal preparation/housework/yardwork tasks.   Goal status: IN PROGRESS  5.  Pt will return to highest level of function using LUE as non-dominant during functional task completion.   Goal status: IN PROGRESS   ASSESSMENT:  CLINICAL IMPRESSION: Patient reports he has been doing his HEP some but not as often as he should have. Pt notes he is able to put his arms behind his back when taking pictures now. Pt  with one small muscle knot palpated in upper trapezius, trace restrictions, therefore no manual techniques completed today. Continued with passive stretching and A/ROM, pt demonstrating ROM WFL in all planes. Downgrading to AA/ROM for abduction in standing due to weakness and poor form. Added functional reaching task and scapular theraband this session, mod fatigue with theraband. Verbal cuing for form and technique.   PERFORMANCE DEFICITS: in functional skills including in functional skills including ADLs, IADLs, coordination, tone, ROM, strength, pain, fascial restrictions, muscle spasms, and UE functional use    PLAN:  OT FREQUENCY: 2x/week  OT DURATION: 6 weeks  PLANNED INTERVENTIONS: self care/ADL training, therapeutic exercise, therapeutic activity, neuromuscular re-education, manual therapy, passive range of motion, splinting, electrical stimulation, ultrasound, moist heat, cryotherapy, patient/family education, and DME and/or AE instructions  CONSULTED AND AGREED WITH PLAN OF CARE: Patient  PLAN FOR NEXT SESSION: Follow up on HEP, continue with A/ROM, working towards shoulder strengthening and stability as able   Ezra Sites, OTR/L  [phone] 10/06/2022, 1:40 PM

## 2022-10-08 ENCOUNTER — Encounter (HOSPITAL_COMMUNITY): Payer: Self-pay | Admitting: Occupational Therapy

## 2022-10-08 ENCOUNTER — Ambulatory Visit (HOSPITAL_COMMUNITY): Payer: 59 | Admitting: Occupational Therapy

## 2022-10-08 DIAGNOSIS — R29898 Other symptoms and signs involving the musculoskeletal system: Secondary | ICD-10-CM | POA: Diagnosis not present

## 2022-10-08 DIAGNOSIS — M25612 Stiffness of left shoulder, not elsewhere classified: Secondary | ICD-10-CM | POA: Diagnosis not present

## 2022-10-08 DIAGNOSIS — M25512 Pain in left shoulder: Secondary | ICD-10-CM | POA: Diagnosis not present

## 2022-10-08 NOTE — Therapy (Signed)
OUTPATIENT OCCUPATIONAL THERAPY ORTHO TREATMENT  Patient Name: JASPAR MELLS MRN: [id number] DOB:1961-04-24, 62 y.o., male Today's Date: 10/08/2022   END OF SESSION:  OT End of Session - 10/08/22 1151     Visit Number 5    Number of Visits 12    Date for OT Re-Evaluation 10/29/22    Authorization Type [place] Employee-$40 copay    OT Start Time 1117    OT Stop Time 1159    OT Time Calculation (min) 42 min    Activity Tolerance Patient tolerated treatment well    Behavior During Therapy [facility] for tasks assessed/performed                 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   Anxiety    Arthritis    Blood transfusion without reported diagnosis    Complication of anesthesia    bleed after last shoulder-had to stay overnight   Depression    Diabetes mellitus    Diabetic peripheral neuropathy (HCC) 03/28/2013   Diverticulosis    Dizziness    Hernia, inguinal    Hyperlipidemia    Hypertension    Impingement syndrome of left shoulder 08/02/2014   Ischemic colitis (HCC)    Multi-infarct state 10/14/2014   Neuropathy of lower extremity    Night sweats    every once in a while   Sleep apnea    no CPAP   Stroke (HCC) 02/2015   Syncope and collapse    Past Surgical History:  Procedure Laterality Date   BIOPSY  06/14/2019   Procedure: BIOPSY;  Surgeon: Sherrilyn Rist, MD;  Location: [facility];  Service: Gastroenterology;;   CERVICAL DISC SURGERY     COLON SURGERY  2003   1/3 removed for diverticulitis   COLONOSCOPY     COLONOSCOPY WITH PROPOFOL N/A 06/14/2019   Procedure: COLONOSCOPY WITH PROPOFOL;  Surgeon: Sherrilyn Rist, MD;  Location: [facility];  Service: Gastroenterology;  Laterality: N/A;   EP IMPLANTABLE DEVICE N/A 05/01/2015   Procedure: Loop Recorder Insertion;  Surgeon: Duke Salvia, MD;  Location: [facility] INVASIVE CV LAB;  Service:  Cardiovascular;  Laterality: N/A;   INGUINAL HERNIA REPAIR Bilateral    KNEE ARTHROSCOPY Right    SHOULDER ARTHROSCOPY Right    1   SHOULDER ARTHROSCOPY Left 08/02/2014   Procedure: LEFT SHOULDER SCOPE DEBRIDEMENT/ACROMIOPLASTY;  Surgeon: Teryl Lucy, MD;  Location: [facility];  Service: Orthopedics;  Laterality: Left;  ANESTHESIA: GENERAL, PRE/POST OP SCALENE   TEE WITHOUT CARDIOVERSION N/A 03/03/2015   Procedure: TRANSESOPHAGEAL ECHOCARDIOGRAM (TEE);  Surgeon: Laqueta Linden, MD;  [facility];  Service: Cardiology;  Laterality: N/A;   UMBILICAL HERNIA REPAIR     with other hernia repair with mesh   WRIST SURGERY Right    fusion   Patient Active Problem List   Diagnosis Date Noted   Hypercoagulable state due to paroxysmal atrial fibrillation (HCC) 07/19/2022   Nontraumatic incomplete tear of left rotator cuff 07/07/2022   Arthritis of left acromioclavicular joint 07/07/2022   Right groin hernia 03/03/2021   Hyperlipidemia associated with type 2 diabetes mellitus (HCC) 12/25/2020   Tendinopathy of right rotator cuff 09/25/2020   Arthrosis of right acromioclavicular joint 09/25/2020   Adrenal adenoma, left 09/07/2019   Chronic anticoagulation    GI bleed 06/14/2019   Vitamin D deficiency 04/18/2019  History of stroke 04/20/2018   GAD (generalized anxiety disorder) 05/15/2016   Acute bronchiolitis due to respiratory syncytial virus (RSV)    Acute CVA (cerebrovascular accident) (HCC) 05/04/2016   Dizziness 05/02/2016   Anxiety and depression 05/02/2016   Paroxysmal atrial fibrillation (HCC) 03/02/2016   PFO (patent foramen ovale) 07/24/2015   OSA (obstructive sleep apnea) 04/18/2015   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 artery (HCC) 04/18/2015   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 Lilyan Punt REFERRING PROVIDER: Jari Sportsman, PA-C  ONSET DATE: 09/01/22  REFERRING DIAG: S/P arthroscopy of left shoulder   THERAPY DIAG:  Acute pain of left shoulder  Other symptoms and signs involving the musculoskeletal system  Stiffness of left shoulder, not elsewhere classified  Rationale for Evaluation and Treatment: Rehabilitation  SUBJECTIVE:   SUBJECTIVE STATEMENT: S: "I can't sit still."   PERTINENT HISTORY: Pt is a 62 y/o male s/p S/P arthroscopy of left shoulder on 09/01/22. Pt presents without sling, reports he is no longer wearing.   PRECAUTIONS: Shoulder-AA/ROM progress as tolerated.   WEIGHT BEARING RESTRICTIONS: Yes NWB  PAIN:  Are you having pain? Yes: NPRS scale: 5/10 Pain location: left shoulder Pain description: aching Aggravating factors: movement, reaching up Relieving factors: rest  FALLS: Has patient fallen in last 6 months? No  PATIENT GOALS: To be able to use the LUE during ADLs.   NEXT MD VISIT: 10/12/22  OBJECTIVE:   HAND DOMINANCE: Right  ADLs: Overall ADLs: Pt is having difficulty with reaching up overhead, behind back. Pt is unable to sleep on the left side, wakes up at night due to pain. Pt is having difficulty with dressing, bathing and reaching head and back. Pt is unable to operate belt buckles.   FUNCTIONAL OUTCOME MEASURES: FOTO: 49/100 10/08/22: 53/100   UPPER EXTREMITY ROM:       Assessed in supine, er/IR adducted  Passive ROM Left eval Left 10/08/22  Shoulder flexion 129 146  Shoulder abduction 113 155  Shoulder internal  rotation 90 90  Shoulder external rotation 64 73  (Blank rows = not tested)    Assessed in seated, er/IR adducted    Active ROM Left eval Left 10/08/22  Shoulder flexion 81 109  Shoulder abduction 48 102  Shoulder internal rotation 90 90  Shoulder external rotation 62 70  (Blank rows = not tested)  UPPER EXTREMITY MMT:     Not tested due to precautions.   MMT Left eval Left 10/08/22  Shoulder flexion  3/5  Shoulder abduction  3/5  Shoulder internal rotation  4/5  Shoulder external rotation  4-/5  (Blank rows = not tested)  SENSATION: Pt reports dullness in anterior shoulder region  OBSERVATIONS: mod fascial restrictions along upper arm, anterior shoulder, and trapezius regions   TODAY'S TREATMENT:  DATE:  10/08/22 -P/ROM: flexion, abduction, er/IR, horizontal abduction, 5 reps -A/ROM: supine-protraction, flexion, abduction, er/IR, horizontal abduction, 15 reps -Proximal shoulder strengthening: supine-paddles, criss cross, circles each direction, 10 reps each -A/ROM: standing-protraction, flexion, er/IR, horizontal abduction, 10 reps -AA/ROM: standing-abduction, 10 reps -Proximal shoulder strengthening on doorway, shoulder at 90 degrees flexion, 1' with 1 rest break -Scapular theraband: red-row, extension, retraction, 10 reps each -Functional Reaching: pt standing at cabinets placing 10 cones on top shelf in flexion, removing in abduction -Overhead lacing: seated, lacing from top down then reversing to remove -UBE: Level 1, 3' forward 2' reverse, pace: 7.5  10/06/22 -P/ROM: flexion, abduction, er/IR, horizontal abduction, 5 reps -A/ROM: supine-protraction, flexion, abduction, er/IR, horizontal abduction, 10 reps -Proximal shoulder strengthening: supine-paddles, criss cross, circles each direction, 10 reps each -A/ROM: standing-protraction, flexion,  er/IR, horizontal abduction, 10 reps -AA/ROM: standing-abduction, 10 reps -X to V arms: 10 reps -Proximal shoulder strengthening: standing-paddles, criss cross, circles each direction, 10 reps each -Wall wash: 1' flexion -Proximal shoulder strengthening on doorway, shoulder at 90 degrees flexion, 1' with 1 rest break -Scapular theraband: red-row, extension, retraction, 10 reps each -Functional Reaching: pt standing at cabinets placing 10 cones on top shelf in flexion, removing in abduction -UBE: Level 1, 3' forward 2' reverse, pace: 7.5  10/01/22 -Manual techniques: noted increased fascial restrictions in bicep and trapezius, manual techniques completed to decrease pain and improve ROM  -P/ROM: flexion, abduction, er/IR, horizontal abduction, 5 reps -AA/ROM: flexion, abduction, er/IR, horizontal abduction, 10 reps -A/ROM: supine; flexion, abduction, er/IR, horizontal abduction, 5 reps -Proximal shoulder strengthening: supine-paddles, criss cross, circles each direction, 10 reps each -Scapular A/ROM: row, extension, retraction, 10 reps   PATIENT EDUCATION: Education details: reviewed completing HEP in supine and standing Person educated: Patient Education method: Explanation, Demonstration, and Handouts Education comprehension: verbalized understanding and returned demonstration  HOME EXERCISE PROGRAM: Eval: table slides 5/24: AA/ROM 5/31: A/ROM   GOALS: Goals reviewed with patient? Yes   SHORT TERM GOALS: Target date: 10/08/22  Pt will be provided with and educated on HEP to improve mobility in LUE required for use during ADL completion.   Goal status: IN PROGRESS  2.  Pt will increase LUE P/ROM by 30 degrees or greater to improve ability to use LUE during dressing tasks with minimal compensatory techniques.   Goal status: MET  3.  Pt will increase LUE strength to 3+/5 to improve ability to reach for items at waist to chest height during bathing and grooming tasks.   Goal  status: IN PROGRESS    LONG TERM GOALS: Target date: 10/29/22  Pt will decrease pain in LUE to 4/10 or less to improve ability to sleep for 2+ consecutive hours without waking due to pain.   Goal status: IN PROGRESS  2.  Pt will decrease LUE fascial restrictions to min amounts or less to improve mobility required for functional reaching tasks such as threading belt or reaching head.  Goal status: IN PROGRESS  3.  Pt will increase LUE A/ROM to at least 140 degrees flexion and abduction, 60 er, to improve ability to use LUE when reaching overhead or behind back during dressing and bathing tasks.   Goal status: IN PROGRESS  4.  Pt will increase LUE strength to 4/5 or greater to improve ability to use LUE when lifting or carrying items during meal preparation/housework/yardwork tasks.   Goal status: IN PROGRESS  5.  Pt will return to highest level of function using LUE as non-dominant during functional task completion.  Goal status: IN PROGRESS   ASSESSMENT:  CLINICAL IMPRESSION: Mini-reassessment completed this session, pt has met 1 STG thus far and demonstrates improved ROM both passive and active, and is progressing towards remaining goals. Continued with A/ROM in supine and standing today, as well as proximal shoulder strengthening and stability work. Pt with better ROM/strength in supine compared to standing. Continues to be unable to complete abduction through full ROM in standing, therefore continued AA/ROM.  Continued with functional reaching, improvement in ability to reach into abduction. Added overhead lacing today for activity tolerance work. Verbal cuing for form and technique.   PERFORMANCE DEFICITS: in functional skills including in functional skills including ADLs, IADLs, coordination, tone, ROM, strength, pain, fascial restrictions, muscle spasms, and UE functional use    PLAN:  OT FREQUENCY: 2x/week  OT DURATION: 6 weeks  PLANNED INTERVENTIONS: self care/ADL  training, therapeutic exercise, therapeutic activity, neuromuscular re-education, manual therapy, passive range of motion, splinting, electrical stimulation, ultrasound, moist heat, cryotherapy, patient/family education, and DME and/or AE instructions  CONSULTED AND AGREED WITH PLAN OF CARE: Patient  PLAN FOR NEXT SESSION: Follow up on HEP, continue with A/ROM, working towards shoulder strengthening and stability as able   Ezra Sites, OTR/L  [phone] 10/08/2022, 12:00 PM

## 2022-10-12 ENCOUNTER — Ambulatory Visit (INDEPENDENT_AMBULATORY_CARE_PROVIDER_SITE_OTHER): Payer: 59 | Admitting: Family Medicine

## 2022-10-12 ENCOUNTER — Other Ambulatory Visit (HOSPITAL_COMMUNITY): Payer: Self-pay

## 2022-10-12 ENCOUNTER — Ambulatory Visit (INDEPENDENT_AMBULATORY_CARE_PROVIDER_SITE_OTHER): Payer: 59 | Admitting: Orthopaedic Surgery

## 2022-10-12 ENCOUNTER — Encounter: Payer: Self-pay | Admitting: Family Medicine

## 2022-10-12 ENCOUNTER — Encounter: Payer: Self-pay | Admitting: Orthopaedic Surgery

## 2022-10-12 ENCOUNTER — Other Ambulatory Visit: Payer: Self-pay

## 2022-10-12 VITALS — BP 134/82 | HR 77 | Wt 182.4 lb

## 2022-10-12 DIAGNOSIS — E785 Hyperlipidemia, unspecified: Secondary | ICD-10-CM | POA: Diagnosis not present

## 2022-10-12 DIAGNOSIS — F411 Generalized anxiety disorder: Secondary | ICD-10-CM

## 2022-10-12 DIAGNOSIS — E1169 Type 2 diabetes mellitus with other specified complication: Secondary | ICD-10-CM

## 2022-10-12 DIAGNOSIS — Z125 Encounter for screening for malignant neoplasm of prostate: Secondary | ICD-10-CM

## 2022-10-12 DIAGNOSIS — Z79899 Other long term (current) drug therapy: Secondary | ICD-10-CM

## 2022-10-12 DIAGNOSIS — E1159 Type 2 diabetes mellitus with other circulatory complications: Secondary | ICD-10-CM | POA: Diagnosis not present

## 2022-10-12 DIAGNOSIS — I10 Essential (primary) hypertension: Secondary | ICD-10-CM

## 2022-10-12 DIAGNOSIS — Z9889 Other specified postprocedural states: Secondary | ICD-10-CM

## 2022-10-12 MED ORDER — ALPRAZOLAM 0.25 MG PO TABS
ORAL_TABLET | ORAL | 3 refills | Status: DC
  Filled 2022-10-12 – 2022-10-30 (×2): qty 75, 30d supply, fill #0
  Filled 2022-12-10: qty 75, 30d supply, fill #1
  Filled 2023-01-17 – 2023-01-24 (×3): qty 75, 30d supply, fill #2
  Filled 2023-02-22: qty 75, 30d supply, fill #3

## 2022-10-12 MED ORDER — FLUOXETINE HCL 20 MG PO CAPS
20.0000 mg | ORAL_CAPSULE | Freq: Every day | ORAL | 1 refills | Status: DC
  Filled 2022-10-12: qty 90, 90d supply, fill #0

## 2022-10-12 MED ORDER — ATORVASTATIN CALCIUM 20 MG PO TABS
20.0000 mg | ORAL_TABLET | Freq: Every day | ORAL | 1 refills | Status: DC
  Filled 2022-10-12 – 2022-11-22 (×2): qty 90, 90d supply, fill #0

## 2022-10-12 MED ORDER — VALACYCLOVIR HCL 1 G PO TABS
1000.0000 mg | ORAL_TABLET | Freq: Every day | ORAL | 3 refills | Status: DC
  Filled 2022-10-12 – 2023-01-01 (×3): qty 90, 90d supply, fill #0
  Filled 2023-04-07: qty 90, 90d supply, fill #1

## 2022-10-12 NOTE — Progress Notes (Signed)
Post-Op Visit Note   Patient: Travis [name]           Date of Birth: 1960/12/28           MRN: [id number] Visit Date: 10/12/2022 PCP: Babs Sciara, MD   Assessment & Plan:  Chief Complaint:  Chief Complaint  Patient presents with   Left Shoulder - Follow-up    Left shoulder scope 09/01/2022   Visit Diagnoses:  1. S/P arthroscopy of left shoulder     Plan: Travis [name] is 6 weeks status post left shoulder scope with debridement.  He is doing well overall.  He is doing physical therapy once to twice a week.  No real complaints.  Examination of left shoulder shows fully healed surgical scars.  Range of motion is progressing nicely in all planes.  Strength of the rotator cuff is also improving nicely.  Patient will continue to do physical therapy until he has met his goals.  I am happy with his progress.  Recheck in 6 weeks.  He will let me know if he wants [place] to look at the trigger finger.  Follow-Up Instructions: No follow-ups on file.   Orders:  No orders of the defined types were placed in this encounter.  No orders of the defined types were placed in this encounter.   Imaging: No results found.  PMFS History: Patient Active Problem List   Diagnosis Date Noted   Hypercoagulable state due to paroxysmal atrial fibrillation (HCC) 07/19/2022   Nontraumatic incomplete tear of left rotator cuff 07/07/2022   Arthritis of left acromioclavicular joint 07/07/2022   Right groin hernia 03/03/2021   Hyperlipidemia associated with type 2 diabetes mellitus (HCC) 12/25/2020   Tendinopathy of right rotator cuff 09/25/2020   Arthrosis of right acromioclavicular joint 09/25/2020   Adrenal adenoma, left 09/07/2019   Chronic anticoagulation    GI bleed 06/14/2019   Vitamin D deficiency 04/18/2019   History of stroke 04/20/2018   GAD (generalized anxiety disorder) 05/15/2016   Acute bronchiolitis due to respiratory syncytial virus (RSV)    Acute CVA (cerebrovascular accident)  (HCC) 05/04/2016   Dizziness 05/02/2016   Anxiety and depression 05/02/2016   Paroxysmal atrial fibrillation (HCC) 03/02/2016   PFO (patent foramen ovale) 07/24/2015   OSA (obstructive sleep apnea) 04/18/2015   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 artery (HCC) 04/18/2015   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   Snoring 01/23/2015   Degeneration of cervical intervertebral disc 01/23/2015   Essential hypertension 12/14/2014   DM type 2 causing vascular disease (HCC) 12/14/2014   Neck pain 12/14/2014   Cerebral infarction, chronic    Multi-infarct state 10/14/2014   Impingement syndrome of left shoulder 08/02/2014   Adhesive capsulitis of left shoulder 08/02/2014   Rectal bleeding 03/13/2014   Diabetes type 2, controlled (HCC) 02/18/2014   Diabetic peripheral neuropathy (HCC) 03/28/2013   Irreducible incisional hernia 11/11/2010   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   Anxiety    Arthritis    Blood transfusion without reported diagnosis    Complication of anesthesia    bleed after last shoulder-had to stay overnight   Depression    Diabetes mellitus    Diabetic peripheral neuropathy (HCC) 03/28/2013   Diverticulosis    Dizziness    Hernia, inguinal    Hyperlipidemia    Hypertension    Impingement syndrome of left shoulder 08/02/2014  Ischemic colitis (HCC)    Multi-infarct state 10/14/2014   Neuropathy of lower extremity    Night sweats    every once in a while   Sleep apnea    no CPAP   Stroke (HCC) 02/2015   Syncope and collapse     Family History  Problem Relation Age of Onset   Stroke Father    Hyperlipidemia Father    Heart attack Sister 60   Stroke Sister    Dementia Mother    ALS Brother        age 54   Diabetes Maternal Grandfather    Colon cancer Neg Hx    Esophageal cancer Neg Hx    Stomach cancer Neg Hx    Rectal cancer Neg Hx      Past Surgical History:  Procedure Laterality Date   BIOPSY  06/14/2019   Procedure: BIOPSY;  Surgeon: Sherrilyn Rist, MD;  Location: [facility];  Service: Gastroenterology;;   CERVICAL DISC SURGERY     COLON SURGERY  2003   1/3 removed for diverticulitis   COLONOSCOPY     COLONOSCOPY WITH PROPOFOL N/A 06/14/2019   Procedure: COLONOSCOPY WITH PROPOFOL;  Surgeon: Sherrilyn Rist, MD;  Location: [facility];  Service: Gastroenterology;  Laterality: N/A;   EP IMPLANTABLE DEVICE N/A 05/01/2015   Procedure: Loop Recorder Insertion;  Surgeon: Duke Salvia, MD;  Location: [facility] INVASIVE CV LAB;  Service: Cardiovascular;  Laterality: N/A;   INGUINAL HERNIA REPAIR Bilateral    KNEE ARTHROSCOPY Right    SHOULDER ARTHROSCOPY Right    1   SHOULDER ARTHROSCOPY Left 08/02/2014   Procedure: LEFT SHOULDER SCOPE DEBRIDEMENT/ACROMIOPLASTY;  Surgeon: Teryl Lucy, MD;  Location: [facility];  Service: Orthopedics;  Laterality: Left;  ANESTHESIA: GENERAL, PRE/POST OP SCALENE   TEE WITHOUT CARDIOVERSION N/A 03/03/2015   Procedure: TRANSESOPHAGEAL ECHOCARDIOGRAM (TEE);  Surgeon: Laqueta Linden, MD;  [facility];  Service: Cardiology;  Laterality: N/A;   UMBILICAL HERNIA REPAIR     with other hernia repair with mesh   WRIST SURGERY Right    fusion   Social History   Occupational History   Occupation: disabled  Tobacco Use   Smoking status: Former    Packs/day: 1.00    Years: 8.00    Additional pack years: 0.00    Total pack years: 8.00    Types: Cigarettes    Start date: 06/07/1970    Quit date: 05/03/1978    Years since quitting: 44.4   Smokeless tobacco: Former    Types: Chew    Quit date: 05/03/1978   Tobacco comments:    Former smoker 07/19/22  Vaping Use   Vaping Use: Never used  Substance and Sexual Activity   Alcohol use: Yes    Alcohol/week: 1.0 standard drink of alcohol    Types: 1 Cans of beer per week    Comment: 1 beer every 6 months h/o heavy  use in the past 07/19/22   Drug use: No   Sexual activity: Yes

## 2022-10-12 NOTE — Patient Instructions (Addendum)
Hi Ean  It was good to see you today  We are 1 continue to do your lab work before your wellness visit in July The labs have been ordered simply go to [facility] 1 week before your wellness visit  As for the alprazolam but it would be healthy for you to gradually reduce the amount you are taking To begin with I would recommend reducing to a half a tablet in the morning 1 tablet midday 1 tablet in the evening We can discuss this further on how it is going when you follow-up in July  Please take care-Dr. Lorin Picket

## 2022-10-12 NOTE — Progress Notes (Signed)
Subjective:    Patient ID: Travis [name], male    DOB: [birth year], 62 y.o.   MRN: [id number]  HPI Patient arrives today for 5 month follow up.  Here today for follow-up Outpatient Encounter Medications as of 10/12/2022  Medication Sig   Accu-Chek FastClix Lancets MISC Use to check blood sugar twice daily.   apixaban (ELIQUIS) 5 MG TABS tablet Take 1 tablet (5 mg total) by mouth 2 (two) times daily.   Blood Glucose Monitoring Suppl (FREESTYLE LITE) w/Device KIT Use to check glucose twice daily   Blood Pressure Monitoring (OMRON 3 SERIES BP MONITOR) DEVI USE AS DIRECTED.   Cholecalciferol 50 MCG (2000 UT) CAPS Take 1 capsule (2,000 Units total) by mouth daily with breakfast.   Continuous Blood Gluc Sensor (DEXCOM G7 SENSOR) MISC Inject into the skin as directed to check continuous blood sugar. Change sensor every 10 days as directed.   COVID-19 mRNA vaccine 2023-2024 (COMIRNATY) syringe Inject into the muscle.   fluticasone (FLONASE) 50 MCG/ACT nasal spray Place 1 spray into both nostrils daily.   folic acid (FOLVITE) 1 MG tablet Take 1 tablet (1 mg total) by mouth daily.   gabapentin (NEURONTIN) 300 MG capsule Take 1 capsule (300 mg total) by mouth every morning AND 1 capsule (300 mg total) daily after lunch AND 2 capsules (600 mg total) at bedtime.   glucose blood (ACCU-CHEK GUIDE) test strip USE AS DIRECTED TO TEST BLOOD SUGAR 2 TIMES DAILY AS DIRECTED   HYDROcodone-acetaminophen (NORCO) 5-325 MG tablet Take 1 tablet by mouth 3 (three) times daily as needed.   hyoscyamine (LEVSIN SL) 0.125 MG SL tablet Place 1 tablet (0.125 mg total) under the tongue daily before breakfast.   insulin degludec (TRESIBA FLEXTOUCH) 100 UNIT/ML FlexTouch Pen Inject 30 Units into the skin at bedtime.   Insulin Pen Needle (COMFORT EZ PEN NEEDLES) 31G X 6 MM MISC USE TO INJECT INSULIN DAILY AS DIRECTED   Insulin Pen Needle (UNIFINE PENTIPS) 31G X 6 MM MISC USE TO INJECT INSULIN DAILY AS DIRECTED   metFORMIN  (GLUCOPHAGE) 500 MG tablet Take 1 tablet (500 mg total) by mouth 2 (two) times daily with a meal.   methocarbamol (ROBAXIN) 750 MG tablet Take 1 tablet (750 mg total) by mouth 2 (two) times daily as needed for muscle spasms.   Multiple Vitamin (MULTIVITAMIN WITH MINERALS) TABS tablet Take 1 tablet daily by mouth.   ondansetron (ZOFRAN) 4 MG tablet Take 1-2 tablets (4-8 mg total) by mouth every 8 (eight) hours as needed for nausea or vomiting.   oxyCODONE-acetaminophen (PERCOCET) 5-325 MG tablet Take 1-2 tablets by mouth 2 (two) times daily as needed for severe pain.   RSV vaccine recomb adjuvanted (AREXVY) 120 MCG/0.5ML injection Inject into the muscle.   [DISCONTINUED] ALPRAZolam (XANAX) 0.25 MG tablet Take 1 tablet (0.25 mg total) by mouth 3 (three) times daily as needed.   [DISCONTINUED] atorvastatin (LIPITOR) 20 MG tablet Take 1 tablet (20 mg total) by mouth daily.   [DISCONTINUED] FLUoxetine (PROZAC) 20 MG capsule Take 1 capsule by mouth daily.   [DISCONTINUED] valACYclovir (VALTREX) 1000 MG tablet Take 1 tablet by mouth daily.   ALPRAZolam (XANAX) 0.25 MG tablet 1/2 tablet in the morning, may take 1 midday, 1 each evening as needed   atorvastatin (LIPITOR) 20 MG tablet Take 1 tablet (20 mg total) by mouth daily.   FLUoxetine (PROZAC) 20 MG capsule Take 1 capsule by mouth daily.   valACYclovir (VALTREX) 1000 MG tablet Take 1 tablet  by mouth daily.   [DISCONTINUED] Insulin Glargine (BASAGLAR KWIKPEN) 100 UNIT/ML INJECT 50 UNITS INTO THE SKIN AT BEDTIME   No facility-administered encounter medications on file as of 10/12/2022.   States his moods overall are doing well on the Prozac Takes alprazolam 3 times per day Patient does take his cholesterol medicine regular basis Also uses his insulin currently Tries to eat relatively healthy Stays physically active Takes his Valtrex daily Taking gabapentin for neuropathy Not taking any opioids currently Patient does try to eat relatively healthy  and stay active Denies any low sugar spells Essential hypertension - Plan: Basic Metabolic Panel  DM type 2 causing vascular disease (HCC) - Plan: Microalbumin / creatinine urine ratio, Hemoglobin A1c  Hyperlipidemia associated with type 2 diabetes mellitus (HCC) - Plan: Lipid panel  GAD (generalized anxiety disorder)  Screening for prostate cancer - Plan: PSA  High risk medication use - Plan: Hepatic function panel  We did have a shared discussion today regarding the use of Xanax and the importance of trying to keep this at a lower level than what he is currently utilizing We also talked about doing lab work to monitor your kidney functions and cholesterol Patient for blood pressure check up.  The patient does have hypertension.   Patient relates dietary measures try to minimize salt The importance of healthy diet and activity were discussed Patient relates compliance  Patient here for follow-up regarding cholesterol.    Patient relates taking medication on a regular basis Denies problems with medication Importance of dietary measures discussed Regular lab work regarding lipid and liver was checked and if needing additional labs was appropriately ordered   Review of Systems     Objective:   Physical Exam General-in no acute distress Eyes-no discharge Lungs-respiratory rate normal, CTA CV-no murmurs,RRR Extremities skin warm dry no edema Neuro grossly normal Behavior normal, alert    Eye exam recommended    Assessment & Plan:   1. Essential hypertension Blood pressure reasonable continue current meds - Basic Metabolic Panel  2. DM type 2 causing vascular disease (HCC) He is followed by endocrinology but we will check labs and share these with endocrinology dietary measures reinforced continue current regimen - Microalbumin / creatinine urine ratio - Hemoglobin A1c  3. Hyperlipidemia associated with type 2 diabetes mellitus (HCC) Continue cholesterol medicine  healthy diet check lipid profile - Lipid panel  4. GAD (generalized anxiety disorder) Patient has been on Xanax for years we discussed that it would be healthier for him to start tapering back we will go back to half a tablet in the morning 1 midday 1 in the evening therefore no greater than 2-1/2 tablets a day if possible just 2 tablets a day follow-up in several months  5. Screening for prostate cancer Screening - PSA  6. High risk medication use Labs ordered - Hepatic function panel  Wellness later this summer

## 2022-10-15 ENCOUNTER — Ambulatory Visit (HOSPITAL_COMMUNITY): Payer: 59 | Admitting: Occupational Therapy

## 2022-10-15 ENCOUNTER — Encounter (HOSPITAL_COMMUNITY): Payer: Self-pay | Admitting: Occupational Therapy

## 2022-10-15 DIAGNOSIS — M25512 Pain in left shoulder: Secondary | ICD-10-CM

## 2022-10-15 DIAGNOSIS — R29898 Other symptoms and signs involving the musculoskeletal system: Secondary | ICD-10-CM | POA: Diagnosis not present

## 2022-10-15 DIAGNOSIS — M25612 Stiffness of left shoulder, not elsewhere classified: Secondary | ICD-10-CM | POA: Diagnosis not present

## 2022-10-15 NOTE — Therapy (Signed)
OUTPATIENT OCCUPATIONAL THERAPY ORTHO TREATMENT  Patient Name: Travis [name] MRN: [id number] DOB:07/12/1960, 62 y.o., male Today's Date: 10/15/2022   END OF SESSION:  OT End of Session - 10/15/22 1155     Visit Number 6    Number of Visits 12    Date for OT Re-Evaluation 10/29/22    Authorization Type [employer]-$40 copay    OT Start Time 1118    OT Stop Time 1157    OT Time Calculation (min) 39 min    Activity Tolerance Patient tolerated treatment well    Behavior During Therapy [facility] for tasks assessed/performed                  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   Anxiety    Arthritis    Blood transfusion without reported diagnosis    Complication of anesthesia    bleed after last shoulder-had to stay overnight   Depression    Diabetes mellitus    Diabetic peripheral neuropathy (HCC) 03/28/2013   Diverticulosis    Dizziness    Hernia, inguinal    Hyperlipidemia    Hypertension    Impingement syndrome of left shoulder 08/02/2014   Ischemic colitis (HCC)    Multi-infarct state 10/14/2014   Neuropathy of lower extremity    Night sweats    every once in a while   Sleep apnea    no CPAP   Stroke (HCC) 02/2015   Syncope and collapse    Past Surgical History:  Procedure Laterality Date   BIOPSY  06/14/2019   Procedure: BIOPSY;  Surgeon: Sherrilyn Rist, MD;  Location: [facility];  Service: Gastroenterology;;   CERVICAL DISC SURGERY     COLON SURGERY  2003   1/3 removed for diverticulitis   COLONOSCOPY     COLONOSCOPY WITH PROPOFOL N/A 06/14/2019   Procedure: COLONOSCOPY WITH PROPOFOL;  Surgeon: Sherrilyn Rist, MD;  Location: [facility];  Service: Gastroenterology;  Laterality: N/A;   EP IMPLANTABLE DEVICE N/A 05/01/2015   Procedure: Loop Recorder Insertion;  Surgeon: Duke Salvia, MD;  Location[facility] INVASIVE CV LAB;  Service:  Cardiovascular;  Laterality: N/A;   INGUINAL HERNIA REPAIR Bilateral    KNEE ARTHROSCOPY Right    SHOULDER ARTHROSCOPY Right    1   SHOULDER ARTHROSCOPY Left 08/02/2014   Procedure: LEFT SHOULDER SCOPE DEBRIDEMENT/ACROMIOPLASTY;  Surgeon: Teryl Lucy, MD;  Location: [facility];  Service: Orthopedics;  Laterality: Left;  ANESTHESIA: GENERAL, PRE/POST OP SCALENE   TEE WITHOUT CARDIOVERSION N/A 03/03/2015   Procedure: TRANSESOPHAGEAL ECHOCARDIOGRAM (TEE);  Surgeon: Laqueta Linden, MD;  Location: [facility];  Service: Cardiology;  Laterality: N/A;   UMBILICAL HERNIA REPAIR     with other hernia repair with mesh   WRIST SURGERY Right    fusion   Patient Active Problem List   Diagnosis Date Noted   Hypercoagulable state due to paroxysmal atrial fibrillation (HCC) 07/19/2022   Nontraumatic incomplete tear of left rotator cuff 07/07/2022   Arthritis of left acromioclavicular joint 07/07/2022   Right groin hernia 03/03/2021   Hyperlipidemia associated with type 2 diabetes mellitus (HCC) 12/25/2020   Tendinopathy of right rotator cuff 09/25/2020   Arthrosis of right acromioclavicular joint 09/25/2020   Adrenal adenoma, left 09/07/2019   Chronic anticoagulation    GI bleed 06/14/2019   Vitamin D deficiency 04/18/2019  History of stroke 04/20/2018   GAD (generalized anxiety disorder) 05/15/2016   Acute bronchiolitis due to respiratory syncytial virus (RSV)    Acute CVA (cerebrovascular accident) (HCC) 05/04/2016   Dizziness 05/02/2016   Anxiety and depression 05/02/2016   Paroxysmal atrial fibrillation (HCC) 03/02/2016   PFO (patent foramen ovale) 07/24/2015   OSA (obstructive sleep apnea) 04/18/2015   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 artery (HCC) 04/18/2015   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 Lilyan Punt REFERRING PROVIDER: Jari Sportsman, PA-C  ONSET DATE: 09/01/22  REFERRING DIAG: S/P arthroscopy of left shoulder   THERAPY DIAG:  Acute pain of left shoulder  Other symptoms and signs involving the musculoskeletal system  Stiffness of left shoulder, not elsewhere classified  Rationale for Evaluation and Treatment: Rehabilitation  SUBJECTIVE:   SUBJECTIVE STATEMENT: S: "It's about the same."   PERTINENT HISTORY: Pt is a 62 y/o male s/p S/P arthroscopy of left shoulder on 09/01/22. Pt presents without sling, reports he is no longer wearing.   PRECAUTIONS: Shoulder-AA/ROM progress as tolerated.   WEIGHT BEARING RESTRICTIONS: Yes NWB  PAIN:  Are you having pain? Yes: NPRS scale: 5/10 Pain location: left shoulder Pain description: aching Aggravating factors: movement, reaching up Relieving factors: rest  FALLS: Has patient fallen in last 6 months? No  PATIENT GOALS: To be able to use the LUE during ADLs.   NEXT MD VISIT: 10/12/22  OBJECTIVE:   HAND DOMINANCE: Right  ADLs: Overall ADLs: Pt is having difficulty with reaching up overhead, behind back. Pt is unable to sleep on the left side, wakes up at night due to pain. Pt is having difficulty with dressing, bathing and reaching head and back. Pt is unable to operate belt buckles.   FUNCTIONAL OUTCOME MEASURES: FOTO: 49/100 10/08/22: 53/100   UPPER EXTREMITY ROM:       Assessed in supine, er/IR adducted  Passive ROM Left eval Left 10/08/22  Shoulder flexion 129 146  Shoulder abduction 113 155  Shoulder internal  rotation 90 90  Shoulder external rotation 64 73  (Blank rows = not tested)    Assessed in seated, er/IR adducted    Active ROM Left eval Left 10/08/22  Shoulder flexion 81 109  Shoulder abduction 48 102  Shoulder internal rotation 90 90  Shoulder external rotation 62 70  (Blank rows = not tested)  UPPER EXTREMITY MMT:     Not tested due to precautions.   MMT Left eval Left 10/08/22  Shoulder flexion  3/5  Shoulder abduction  3/5  Shoulder internal rotation  4/5  Shoulder external rotation  4-/5  (Blank rows = not tested)  SENSATION: Pt reports dullness in anterior shoulder region  OBSERVATIONS: mod fascial restrictions along upper arm, anterior shoulder, and trapezius regions   TODAY'S TREATMENT:  DATE:  10/15/22 -P/ROM: flexion, abduction, er/IR, horizontal abduction, 5 reps -A/ROM: supine-protraction, flexion, abduction, er/IR, horizontal abduction, 15 reps -Proximal shoulder strengthening: supine-paddles, criss cross, circles each direction, 12 reps each -A/ROM: standing-protraction, flexion, er/IR, horizontal abduction with 3 breaks, abduction, 12 reps -Proximal shoulder strengthening on doorway, shoulder at 90 degrees flexion, 1'  -Scapular theraband: green-row, extension, retraction, 12 reps each -Functional Reaching: pt standing at kitchen cabinets, reaching into abduction to remove items from middle shelf, turning and reaching in flexion to place items from bottom shelf to middle shelf, then reaching in abduction to place items on counter onto bottom shelf -Overhead lacing: seated, lacing from top down then reversing to remove -UBE: Level 1, 3' forward 2' reverse, pace: 7.5  10/08/22 -P/ROM: flexion, abduction, er/IR, horizontal abduction, 5 reps -A/ROM: supine-protraction, flexion, abduction, er/IR, horizontal abduction, 15 reps -Proximal  shoulder strengthening: supine-paddles, criss cross, circles each direction, 10 reps each -A/ROM: standing-protraction, flexion, er/IR, horizontal abduction, 10 reps -AA/ROM: standing-abduction, 10 reps -Proximal shoulder strengthening on doorway, shoulder at 90 degrees flexion, 1' with 1 rest break -Scapular theraband: red-row, extension, retraction, 10 reps each -Functional Reaching: pt standing at cabinets placing 10 cones on top shelf in flexion, removing in abduction -Overhead lacing: seated, lacing from top down then reversing to remove -UBE: Level 1, 3' forward 2' reverse, pace: 7.5  10/06/22 -P/ROM: flexion, abduction, er/IR, horizontal abduction, 5 reps -A/ROM: supine-protraction, flexion, abduction, er/IR, horizontal abduction, 10 reps -Proximal shoulder strengthening: supine-paddles, criss cross, circles each direction, 10 reps each -A/ROM: standing-protraction, flexion, er/IR, horizontal abduction, 10 reps -AA/ROM: standing-abduction, 10 reps -X to V arms: 10 reps -Proximal shoulder strengthening: standing-paddles, criss cross, circles each direction, 10 reps each -Wall wash: 1' flexion -Proximal shoulder strengthening on doorway, shoulder at 90 degrees flexion, 1' with 1 rest break -Scapular theraband: red-row, extension, retraction, 10 reps each -Functional Reaching: pt standing at cabinets placing 10 cones on top shelf in flexion, removing in abduction -UBE: Level 1, 3' forward 2' reverse, pace: 7.5    PATIENT EDUCATION: Education details: continue with A/ROM Person educated: Patient Education method: Explanation, Demonstration, and Handouts Education comprehension: verbalized understanding and returned demonstration  HOME EXERCISE PROGRAM: Eval: table slides 5/24: AA/ROM 5/31: A/ROM   GOALS: Goals reviewed with patient? Yes   SHORT TERM GOALS: Target date: 10/08/22  Pt will be provided with and educated on HEP to improve mobility in LUE required for use during  ADL completion.   Goal status: IN PROGRESS  2.  Pt will increase LUE P/ROM by 30 degrees or greater to improve ability to use LUE during dressing tasks with minimal compensatory techniques.   Goal status: MET  3.  Pt will increase LUE strength to 3+/5 to improve ability to reach for items at waist to chest height during bathing and grooming tasks.   Goal status: IN PROGRESS    LONG TERM GOALS: Target date: 10/29/22  Pt will decrease pain in LUE to 4/10 or less to improve ability to sleep for 2+ consecutive hours without waking due to pain.   Goal status: IN PROGRESS  2.  Pt will decrease LUE fascial restrictions to min amounts or less to improve mobility required for functional reaching tasks such as threading belt or reaching head.  Goal status: IN PROGRESS  3.  Pt will increase LUE A/ROM to at least 140 degrees flexion and abduction, 60 er, to improve ability to use LUE when reaching overhead or behind back during dressing and bathing tasks.  Goal status: IN PROGRESS  4.  Pt will increase LUE strength to 4/5 or greater to improve ability to use LUE when lifting or carrying items during meal preparation/housework/yardwork tasks.   Goal status: IN PROGRESS  5.  Pt will return to highest level of function using LUE as non-dominant during functional task completion.   Goal status: IN PROGRESS   ASSESSMENT:  CLINICAL IMPRESSION: Continued with passive stretching and A/ROM, pt with ROM WFL, however notable muscle fatigue present. Continued with proximal shoulder strengthening, no rest break needed today. Pt was able to complete abduction A/ROM in standing today when completed as first standing exercise. Increased theraband level to green, continued with functional reaching with focus on abduction. Verbal cuing for form and technique.   PERFORMANCE DEFICITS: in functional skills including in functional skills including ADLs, IADLs, coordination, tone, ROM, strength, pain, fascial  restrictions, muscle spasms, and UE functional use    PLAN:  OT FREQUENCY: 2x/week  OT DURATION: 6 weeks  PLANNED INTERVENTIONS: self care/ADL training, therapeutic exercise, therapeutic activity, neuromuscular re-education, manual therapy, passive range of motion, splinting, electrical stimulation, ultrasound, moist heat, cryotherapy, patient/family education, and DME and/or AE instructions  CONSULTED AND AGREED WITH PLAN OF CARE: Patient  PLAN FOR NEXT SESSION: continue with A/ROM, working towards shoulder strengthening and stability as able, update HEP for scapular theraband   Ezra Sites, OTR/L  [phone] 10/15/2022, 11:58 AM

## 2022-10-19 ENCOUNTER — Ambulatory Visit (HOSPITAL_COMMUNITY): Payer: 59 | Admitting: Occupational Therapy

## 2022-10-19 DIAGNOSIS — M25612 Stiffness of left shoulder, not elsewhere classified: Secondary | ICD-10-CM

## 2022-10-19 DIAGNOSIS — R29898 Other symptoms and signs involving the musculoskeletal system: Secondary | ICD-10-CM

## 2022-10-19 DIAGNOSIS — M25512 Pain in left shoulder: Secondary | ICD-10-CM | POA: Diagnosis not present

## 2022-10-19 NOTE — Patient Instructions (Signed)

## 2022-10-19 NOTE — Therapy (Signed)
OUTPATIENT OCCUPATIONAL THERAPY ORTHO TREATMENT  Patient Name: Travis [name] MRN: [id number] DOB:02-17-1961, 62 y.o., male Today's Date: 10/19/2022   END OF SESSION:  OT End of Session - 10/19/22 1929     Visit Number 7    Number of Visits 12    Date for OT Re-Evaluation 10/29/22    Authorization Type [employer] Employee-$40 copay    OT Start Time 1117    OT Stop Time 1159    OT Time Calculation (min) 42 min    Activity Tolerance Patient tolerated treatment well    Behavior During Therapy [facility] for tasks assessed/performed             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   Anxiety    Arthritis    Blood transfusion without reported diagnosis    Complication of anesthesia    bleed after last shoulder-had to stay overnight   Depression    Diabetes mellitus    Diabetic peripheral neuropathy (HCC) 03/28/2013   Diverticulosis    Dizziness    Hernia, inguinal    Hyperlipidemia    Hypertension    Impingement syndrome of left shoulder 08/02/2014   Ischemic colitis (HCC)    Multi-infarct state 10/14/2014   Neuropathy of lower extremity    Night sweats    every once in a while   Sleep apnea    no CPAP   Stroke (HCC) 02/2015   Syncope and collapse    Past Surgical History:  Procedure Laterality Date   BIOPSY  06/14/2019   Procedure: BIOPSY;  Surgeon: Sherrilyn Rist, MD;  Location: [facility];  Service: Gastroenterology;;   CERVICAL DISC SURGERY     COLON SURGERY  2003   1/3 removed for diverticulitis   COLONOSCOPY     COLONOSCOPY WITH PROPOFOL N/A 06/14/2019   Procedure: COLONOSCOPY WITH PROPOFOL;  Surgeon: Sherrilyn Rist, MD;  Location: [facility];  Service: Gastroenterology;  Laterality: N/A;   EP IMPLANTABLE DEVICE N/A 05/01/2015   Procedure: Loop Recorder Insertion;  Surgeon: Duke Salvia, MD;  Location: [facility] INVASIVE CV LAB;  Service: Cardiovascular;   Laterality: N/A;   INGUINAL HERNIA REPAIR Bilateral    KNEE ARTHROSCOPY Right    SHOULDER ARTHROSCOPY Right    1   SHOULDER ARTHROSCOPY Left 08/02/2014   Procedure: LEFT SHOULDER SCOPE DEBRIDEMENT/ACROMIOPLASTY;  Surgeon: Teryl Lucy, MD;  Location: [facility];  Service: Orthopedics;  Laterality: Left;  ANESTHESIA: GENERAL, PRE/POST OP SCALENE   TEE WITHOUT CARDIOVERSION N/A 03/03/2015   Procedure: TRANSESOPHAGEAL ECHOCARDIOGRAM (TEE);  Surgeon: Laqueta Linden, MD;  [facility]: [facility];  Service: Cardiology;  Laterality: N/A;   UMBILICAL HERNIA REPAIR     with other hernia repair with mesh   WRIST SURGERY Right    fusion   Patient Active Problem List   Diagnosis Date Noted   Hypercoagulable state due to paroxysmal atrial fibrillation (HCC) 07/19/2022   Nontraumatic incomplete tear of left rotator cuff 07/07/2022   Arthritis of left acromioclavicular joint 07/07/2022   Right groin hernia 03/03/2021   Hyperlipidemia associated with type 2 diabetes mellitus (HCC) 12/25/2020   Tendinopathy of right rotator cuff 09/25/2020   Arthrosis of right acromioclavicular joint 09/25/2020   Adrenal adenoma, left 09/07/2019   Chronic anticoagulation    GI bleed 06/14/2019   Vitamin D deficiency 04/18/2019   History of stroke 04/20/2018  GAD (generalized anxiety disorder) 05/15/2016   Acute bronchiolitis due to respiratory syncytial virus (RSV)    Acute CVA (cerebrovascular accident) (HCC) 05/04/2016   Dizziness 05/02/2016   Anxiety and depression 05/02/2016   Paroxysmal atrial fibrillation (HCC) 03/02/2016   PFO (patent foramen ovale) 07/24/2015   OSA (obstructive sleep apnea) 04/18/2015   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 artery (HCC) 04/18/2015   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 Lilyan Punt REFERRING PROVIDER: Jari Sportsman, PA-C  ONSET DATE: 09/01/22  REFERRING DIAG: S/P arthroscopy of left shoulder   THERAPY DIAG:  Other symptoms and signs involving the musculoskeletal system  Acute pain of left shoulder  Stiffness of left shoulder, not elsewhere classified  Rationale for Evaluation and Treatment: Rehabilitation  SUBJECTIVE:   SUBJECTIVE STATEMENT: S: "I keep trying to help stretch my arm up."   PERTINENT HISTORY: Pt is a 62 y/o male s/p S/P arthroscopy of left shoulder on 09/01/22. Pt presents without sling, reports he is no longer wearing.   PRECAUTIONS: Shoulder-AA/ROM progress as tolerated.   WEIGHT BEARING RESTRICTIONS: Yes NWB  PAIN:  Are you having pain? Yes: NPRS scale: 5/10 Pain location: left shoulder Pain description: aching Aggravating factors: movement, reaching up Relieving factors: rest  FALLS: Has patient fallen in last 6 months? No  PATIENT GOALS: To be able to use the LUE during ADLs.   NEXT MD VISIT: 10/12/22  OBJECTIVE:   HAND DOMINANCE: Right  ADLs: Overall ADLs: Pt is having difficulty with reaching up overhead, behind back. Pt is unable to sleep on the left side, wakes up at night due to pain. Pt is having difficulty with dressing, bathing and reaching head and back. Pt is unable to operate belt buckles.   FUNCTIONAL OUTCOME MEASURES: FOTO: 49/100 10/08/22: 53/100  UPPER EXTREMITY ROM:       Assessed in supine, er/IR adducted  Passive ROM Left eval Left 10/08/22  Shoulder flexion 129 146  Shoulder abduction 113 155  Shoulder  internal rotation 90 90  Shoulder external rotation 64 73  (Blank rows = not tested)    Assessed in seated, er/IR adducted    Active ROM Left eval Left 10/08/22  Shoulder flexion 81 109  Shoulder abduction 48 102  Shoulder internal rotation 90 90  Shoulder external rotation 62 70  (Blank rows = not tested)  UPPER EXTREMITY MMT:     Not tested due to precautions.   MMT Left eval Left 10/08/22  Shoulder flexion  3/5  Shoulder abduction  3/5  Shoulder internal rotation  4/5  Shoulder external rotation  4-/5  (Blank rows = not tested)  SENSATION: Pt reports dullness in anterior shoulder region  OBSERVATIONS: mod fascial restrictions along upper arm, anterior shoulder, and trapezius regions   TODAY'S TREATMENT:  DATE:  10/19/22 -Ball on the Wall: flexion, abduction, x10 -A/ROM: seated, flexion, abduction, protraction, horizontal abduction, er/IR, x10 -Proximal Shoulder Strengthening: 1lb dumbbells, paddles, criss cross, circles both directions, x10 -Scapular Strengthening: green band, extension, retraction, protraction, rows, x15 -Shoulder strengthening: 1lb dumbbell, flexion, protraction, er/IR x10  10/15/22 -P/ROM: flexion, abduction, er/IR, horizontal abduction, 5 reps -A/ROM: supine-protraction, flexion, abduction, er/IR, horizontal abduction, 15 reps -Proximal shoulder strengthening: supine-paddles, criss cross, circles each direction, 12 reps each -A/ROM: standing-protraction, flexion, er/IR, horizontal abduction with 3 breaks, abduction, 12 reps -Proximal shoulder strengthening on doorway, shoulder at 90 degrees flexion, 1'  -Scapular theraband: green-row, extension, retraction, 12 reps each -Functional Reaching: pt standing at kitchen cabinets, reaching into abduction to remove items from middle shelf, turning and reaching in flexion to place  items from bottom shelf to middle shelf, then reaching in abduction to place items on counter onto bottom shelf -Overhead lacing: seated, lacing from top down then reversing to remove -UBE: Level 1, 3' forward 2' reverse, pace: 7.5  10/08/22 -P/ROM: flexion, abduction, er/IR, horizontal abduction, 5 reps -A/ROM: supine-protraction, flexion, abduction, er/IR, horizontal abduction, 15 reps -Proximal shoulder strengthening: supine-paddles, criss cross, circles each direction, 10 reps each -A/ROM: standing-protraction, flexion, er/IR, horizontal abduction, 10 reps -AA/ROM: standing-abduction, 10 reps -Proximal shoulder strengthening on doorway, shoulder at 90 degrees flexion, 1' with 1 rest break -Scapular theraband: red-row, extension, retraction, 10 reps each -Functional Reaching: pt standing at cabinets placing 10 cones on top shelf in flexion, removing in abduction -Overhead lacing: seated, lacing from top down then reversing to remove -UBE: Level 1, 3' forward 2' reverse, pace: 7.5   PATIENT EDUCATION: Education details: Publishing rights manager Person educated: Patient Education method: Explanation, Demonstration, and Handouts Education comprehension: verbalized understanding and returned demonstration  HOME EXERCISE PROGRAM: Eval: table slides 5/24: AA/ROM 5/31: A/ROM  6/18: Scapular Strengthening  GOALS: Goals reviewed with patient? Yes   SHORT TERM GOALS: Target date: 10/08/22  Pt will be provided with and educated on HEP to improve mobility in LUE required for use during ADL completion.   Goal status: IN PROGRESS  2.  Pt will increase LUE P/ROM by 30 degrees or greater to improve ability to use LUE during dressing tasks with minimal compensatory techniques.   Goal status: MET  3.  Pt will increase LUE strength to 3+/5 to improve ability to reach for items at waist to chest height during bathing and grooming tasks.   Goal status: IN PROGRESS    LONG TERM GOALS: Target  date: 10/29/22  Pt will decrease pain in LUE to 4/10 or less to improve ability to sleep for 2+ consecutive hours without waking due to pain.   Goal status: IN PROGRESS  2.  Pt will decrease LUE fascial restrictions to min amounts or less to improve mobility required for functional reaching tasks such as threading belt or reaching head.  Goal status: IN PROGRESS  3.  Pt will increase LUE A/ROM to at least 140 degrees flexion and abduction, 60 er, to improve ability to use LUE when reaching overhead or behind back during dressing and bathing tasks.   Goal status: IN PROGRESS  4.  Pt will increase LUE strength to 4/5 or greater to improve ability to use LUE when lifting or carrying items during meal preparation/housework/yardwork tasks.   Goal status: IN PROGRESS  5.  Pt will return to highest level of function using LUE as non-dominant during functional task completion.   Goal status: IN PROGRESS   ASSESSMENT:  CLINICAL IMPRESSION: Pt continues to report severe pain and discomfort with ROM. This session he was able to achieve 80% of full ROM and full ROM when he uses his RUE to stretch the LUE up. OT had pt initiate strengthening and proximal shoulder exercises with 1lb dumbbells this session which he demonstrated increased effort and fatigue, requiring multiple rest breaks during all exercises. OT providing verbal and visual cuing for positioning and technique throughout session.   PERFORMANCE DEFICITS: in functional skills including in functional skills including ADLs, IADLs, coordination, tone, ROM, strength, pain, fascial restrictions, muscle spasms, and UE functional use    PLAN:  OT FREQUENCY: 2x/week  OT DURATION: 6 weeks  PLANNED INTERVENTIONS: self care/ADL training, therapeutic exercise, therapeutic activity, neuromuscular re-education, manual therapy, passive range of motion, splinting, electrical stimulation, ultrasound, moist heat, cryotherapy, patient/family  education, and DME and/or AE instructions  CONSULTED AND AGREED WITH PLAN OF CARE: Patient  PLAN FOR NEXT SESSION: continue with A/ROM, working towards shoulder strengthening and stability as able   Trish Mage, OTR/L [phone] 10/19/2022, 7:30 PM

## 2022-10-22 ENCOUNTER — Encounter (HOSPITAL_COMMUNITY): Payer: Self-pay | Admitting: Occupational Therapy

## 2022-10-22 ENCOUNTER — Ambulatory Visit (HOSPITAL_COMMUNITY): Payer: 59 | Admitting: Occupational Therapy

## 2022-10-22 DIAGNOSIS — M25612 Stiffness of left shoulder, not elsewhere classified: Secondary | ICD-10-CM | POA: Diagnosis not present

## 2022-10-22 DIAGNOSIS — R29898 Other symptoms and signs involving the musculoskeletal system: Secondary | ICD-10-CM

## 2022-10-22 DIAGNOSIS — M25512 Pain in left shoulder: Secondary | ICD-10-CM | POA: Diagnosis not present

## 2022-10-22 NOTE — Therapy (Signed)
OUTPATIENT OCCUPATIONAL THERAPY ORTHO TREATMENT AND REASSESSMENT  Patient Name: Travis [name] MRN: [id number] DOB:03/15/1961, 62 y.o., male Today's Date: 10/22/2022  Progress Note Reporting Period 10/08/22 to 10/22/22  See note below for Objective Data and Assessment of Progress/Goals.     END OF SESSION:  OT End of Session - 10/22/22 1959     Visit Number 8    Number of Visits 12    Date for OT Re-Evaluation 11/26/22    Authorization Type [employer] Employee-$40 copay    Progress Note Due on Visit 18    OT Start Time 1120    OT Stop Time 1202    OT Time Calculation (min) 42 min    Activity Tolerance Patient tolerated treatment well    Behavior During Therapy WFL for tasks assessed/performed              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   Anxiety    Arthritis    Blood transfusion without reported diagnosis    Complication of anesthesia    bleed after last shoulder-had to stay overnight   Depression    Diabetes mellitus    Diabetic peripheral neuropathy (HCC) 03/28/2013   Diverticulosis    Dizziness    Hernia, inguinal    Hyperlipidemia    Hypertension    Impingement syndrome of left shoulder 08/02/2014   Ischemic colitis (HCC)    Multi-infarct state 10/14/2014   Neuropathy of lower extremity    Night sweats    every once in a while   Sleep apnea    no CPAP   Stroke (HCC) 02/2015   Syncope and collapse    Past Surgical History:  Procedure Laterality Date   BIOPSY  06/14/2019   Procedure: BIOPSY;  Surgeon: Sherrilyn Rist, MD;  Location: [facility];  Service: Gastroenterology;;   CERVICAL DISC SURGERY     COLON SURGERY  2003   1/3 removed for diverticulitis   COLONOSCOPY     COLONOSCOPY WITH PROPOFOL N/A 06/14/2019   Procedure: COLONOSCOPY WITH PROPOFOL;  Surgeon: Sherrilyn Rist, MD;  Location: [facility];  Service: Gastroenterology;   Laterality: N/A;   EP IMPLANTABLE DEVICE N/A 05/01/2015   Procedure: Loop Recorder Insertion;  Surgeon: Duke Salvia, MD;  Location: [facility] INVASIVE CV LAB;  Service: Cardiovascular;  Laterality: N/A;   INGUINAL HERNIA REPAIR Bilateral    KNEE ARTHROSCOPY Right    SHOULDER ARTHROSCOPY Right    1   SHOULDER ARTHROSCOPY Left 08/02/2014   Procedure: LEFT SHOULDER SCOPE DEBRIDEMENT/ACROMIOPLASTY;  Surgeon: Teryl Lucy, MD;  Location: [facility];  Service: Orthopedics;  Laterality: Left;  ANESTHESIA: GENERAL, PRE/POST OP SCALENE   TEE WITHOUT CARDIOVERSION N/A 03/03/2015   Procedure: TRANSESOPHAGEAL ECHOCARDIOGRAM (TEE);  Surgeon: Laqueta Linden, MD;  [facility];  Service: Cardiology;  Laterality: N/A;   UMBILICAL HERNIA REPAIR     with other hernia repair with mesh   WRIST SURGERY Right    fusion   Patient Active Problem List   Diagnosis Date Noted   Hypercoagulable state due to paroxysmal atrial fibrillation (HCC) 07/19/2022   Nontraumatic incomplete tear of left rotator cuff 07/07/2022   Arthritis of left acromioclavicular joint 07/07/2022   Right groin hernia 03/03/2021   Hyperlipidemia associated with type 2 diabetes mellitus (HCC) 12/25/2020   Tendinopathy of right rotator cuff 09/25/2020   Arthrosis  of right acromioclavicular joint 09/25/2020   Adrenal adenoma, left 09/07/2019   Chronic anticoagulation    GI bleed 06/14/2019   Vitamin D deficiency 04/18/2019   History of stroke 04/20/2018   GAD (generalized anxiety disorder) 05/15/2016   Acute bronchiolitis due to respiratory syncytial virus (RSV)    Acute CVA (cerebrovascular accident) (HCC) 05/04/2016   Dizziness 05/02/2016   Anxiety and depression 05/02/2016   Paroxysmal atrial fibrillation (HCC) 03/02/2016   PFO (patent foramen ovale) 07/24/2015   OSA (obstructive sleep apnea) 04/18/2015   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 artery (HCC) 04/18/2015   Cerebellar stroke  (HCC) 02/28/2015   Snoring 01/23/2015   Degeneration of cervical intervertebral disc 01/23/2015   Essential hypertension 12/14/2014   DM type 2 causing vascular disease (HCC) 12/14/2014   Neck pain 12/14/2014   Cerebral infarction, chronic    Multi-infarct state 10/14/2014   Impingement syndrome of left shoulder 08/02/2014   Adhesive capsulitis of left shoulder 08/02/2014   Rectal bleeding 03/13/2014   Diabetes type 2, controlled (HCC) 02/18/2014   Diabetic peripheral neuropathy (HCC) 03/28/2013   Irreducible incisional hernia 11/11/2010    PCP: Dr. Lilyan Punt REFERRING PROVIDER: Jari Sportsman, PA-C  ONSET DATE: 09/01/22  REFERRING DIAG: S/P arthroscopy of left shoulder   THERAPY DIAG:  Other symptoms and signs involving the musculoskeletal system  Acute pain of left shoulder  Stiffness of left shoulder, not elsewhere classified  Rationale for Evaluation and Treatment: Rehabilitation  SUBJECTIVE:   SUBJECTIVE STATEMENT: S: "One day it'll be right"   PERTINENT HISTORY: Pt is a 62 y/o male s/p S/P arthroscopy of left shoulder on 09/01/22. Pt presents without sling, reports he is no longer wearing.   PRECAUTIONS: Shoulder-AA/ROM progress as tolerated.   WEIGHT BEARING RESTRICTIONS: Yes NWB  PAIN:  Are you having pain? Yes: NPRS scale: 6/10 Pain location: left shoulder Pain description: aching Aggravating factors: movement, reaching up Relieving factors: rest  FALLS: Has patient fallen in last 6 months? No  PATIENT GOALS: To be able to use the LUE during ADLs.   NEXT MD VISIT: 10/12/22  OBJECTIVE:   HAND DOMINANCE: Right  ADLs: Overall ADLs: Pt is having difficulty with reaching up overhead, behind back. Pt is unable to sleep on the left side, wakes up at night due to pain. Pt is having difficulty with dressing, bathing and reaching head and back. Pt is unable to operate belt buckles.   FUNCTIONAL OUTCOME MEASURES: FOTO: 49/100 10/08/22: 53/100 10/22/22:  59.60/100  UPPER EXTREMITY ROM:       Assessed in supine, er/IR adducted  Passive ROM Left eval Left 10/08/22  Shoulder flexion 129 146  Shoulder abduction 113 155  Shoulder internal rotation 90 90  Shoulder external rotation 64 73  (Blank rows = not tested)    Assessed in seated, er/IR adducted    Active ROM Left eval Left 10/08/22 Left 10/22/22  Shoulder flexion 81 109 116  Shoulder abduction 48 102 124  Shoulder internal rotation 90 90 90  Shoulder external rotation 62 70 88  (Blank rows = not tested)  UPPER EXTREMITY MMT:     Not tested due to precautions.   MMT Left eval Left 10/08/22 Left 10/22/22  Shoulder flexion  3/5 3+/5  Shoulder abduction  3/5 3/5  Shoulder internal rotation  4/5 4-/5  Shoulder external rotation  4-/5 4/5  (Blank rows = not tested)  SENSATION: Pt reports dullness in anterior shoulder region  OBSERVATIONS: mod fascial restrictions along upper  arm, anterior shoulder, and trapezius regions   TODAY'S TREATMENT:                                                                                                                              DATE:  10/19/22 -Ball on the Wall: flexion, abduction, x10 -A/ROM: seated, flexion, abduction, protraction, horizontal abduction, er/IR, x10 -Proximal Shoulder Strengthening: 1lb dumbbells, paddles, criss cross, circles both directions, x10 -Scapular Strengthening: green band, extension, retraction, protraction, rows, x15 -Shoulder strengthening: 2lb dumbbell, flexion, protraction, er/IR x10 -Reassessment Measurements  10/15/22 -P/ROM: flexion, abduction, er/IR, horizontal abduction, 5 reps -A/ROM: supine-protraction, flexion, abduction, er/IR, horizontal abduction, 15 reps -Proximal shoulder strengthening: supine-paddles, criss cross, circles each direction, 12 reps each -A/ROM: standing-protraction, flexion, er/IR, horizontal abduction with 3 breaks, abduction, 12 reps -Proximal shoulder strengthening on  doorway, shoulder at 90 degrees flexion, 1'  -Scapular theraband: green-row, extension, retraction, 12 reps each -Functional Reaching: pt standing at kitchen cabinets, reaching into abduction to remove items from middle shelf, turning and reaching in flexion to place items from bottom shelf to middle shelf, then reaching in abduction to place items on counter onto bottom shelf -Overhead lacing: seated, lacing from top down then reversing to remove -UBE: Level 1, 3' forward 2' reverse, pace: 7.5  10/08/22 -P/ROM: flexion, abduction, er/IR, horizontal abduction, 5 reps -A/ROM: supine-protraction, flexion, abduction, er/IR, horizontal abduction, 15 reps -Proximal shoulder strengthening: supine-paddles, criss cross, circles each direction, 10 reps each -A/ROM: standing-protraction, flexion, er/IR, horizontal abduction, 10 reps -AA/ROM: standing-abduction, 10 reps -Proximal shoulder strengthening on doorway, shoulder at 90 degrees flexion, 1' with 1 rest break -Scapular theraband: red-row, extension, retraction, 10 reps each -Functional Reaching: pt standing at cabinets placing 10 cones on top shelf in flexion, removing in abduction -Overhead lacing: seated, lacing from top down then reversing to remove -UBE: Level 1, 3' forward 2' reverse, pace: 7.5   PATIENT EDUCATION: Education details: Continue HEP Person educated: Patient Education method: Explanation, Demonstration, and Handouts Education comprehension: verbalized understanding and returned demonstration  HOME EXERCISE PROGRAM: Eval: table slides 5/24: AA/ROM 5/31: A/ROM  6/18: Scapular Strengthening  GOALS: Goals reviewed with patient? Yes   SHORT TERM GOALS: Target date: 10/08/22  Pt will be provided with and educated on HEP to improve mobility in LUE required for use during ADL completion.   Goal status: IN PROGRESS  2.  Pt will increase LUE P/ROM by 30 degrees or greater to improve ability to use LUE during dressing tasks  with minimal compensatory techniques.   Goal status: MET  3.  Pt will increase LUE strength to 3+/5 to improve ability to reach for items at waist to chest height during bathing and grooming tasks.   Goal status: IN PROGRESS    LONG TERM GOALS: Target date: 10/29/22  Pt will decrease pain in LUE to 4/10 or less to improve ability to sleep for 2+ consecutive hours without waking due to pain.   Goal status: IN PROGRESS  2.  Pt will decrease LUE fascial restrictions to min amounts or less to improve mobility required for functional reaching tasks such as threading belt or reaching head.  Goal status: IN PROGRESS  3.  Pt will increase LUE A/ROM to at least 140 degrees flexion and abduction, 60 er, to improve ability to use LUE when reaching overhead or behind back during dressing and bathing tasks.   Goal status: IN PROGRESS  4.  Pt will increase LUE strength to 4/5 or greater to improve ability to use LUE when lifting or carrying items during meal preparation/housework/yardwork tasks.   Goal status: IN PROGRESS  5.  Pt will return to highest level of function using LUE as non-dominant during functional task completion.   Goal status: IN PROGRESS   ASSESSMENT:  CLINICAL IMPRESSION: Pt was reassessed this session. He is demonstrating improvements in all ROM, as well as overall improvement in his strength. This session focused on strengthening and quality of movement, as OT continued to provide cuing for pt to move in a slow and controlled manner. He continues to have significant discomfort and required increased effort for all movements. Verbal and visual cuing provided throughout for improved positioning and technique. Recommending pt continue OT for 2x per week for 4 more weeks to improve strength, endurance, and full ROM with good movement pattern.   PERFORMANCE DEFICITS: in functional skills including in functional skills including ADLs, IADLs, coordination, tone, ROM, strength,  pain, fascial restrictions, muscle spasms, and UE functional use    PLAN:  OT FREQUENCY: 2x/week  OT DURATION: 6 weeks  PLANNED INTERVENTIONS: self care/ADL training, therapeutic exercise, therapeutic activity, neuromuscular re-education, manual therapy, passive range of motion, splinting, electrical stimulation, ultrasound, moist heat, cryotherapy, patient/family education, and DME and/or AE instructions  CONSULTED AND AGREED WITH PLAN OF CARE: Patient  PLAN FOR NEXT SESSION: continue with A/ROM, working towards shoulder strengthening and stability as able   Trish Mage, OTR/L [phone] 10/22/2022, 8:01 PM

## 2022-10-25 ENCOUNTER — Other Ambulatory Visit (HOSPITAL_COMMUNITY): Payer: Self-pay

## 2022-10-25 ENCOUNTER — Other Ambulatory Visit: Payer: Self-pay | Admitting: Nurse Practitioner

## 2022-10-26 ENCOUNTER — Other Ambulatory Visit (HOSPITAL_COMMUNITY): Payer: Self-pay

## 2022-10-26 ENCOUNTER — Other Ambulatory Visit: Payer: Self-pay

## 2022-10-26 MED ORDER — METFORMIN HCL 500 MG PO TABS
500.0000 mg | ORAL_TABLET | Freq: Two times a day (BID) | ORAL | 0 refills | Status: DC
  Filled 2022-10-26: qty 180, 90d supply, fill #0

## 2022-10-27 ENCOUNTER — Other Ambulatory Visit: Payer: Self-pay

## 2022-10-29 ENCOUNTER — Other Ambulatory Visit: Payer: Self-pay

## 2022-10-29 ENCOUNTER — Ambulatory Visit (HOSPITAL_COMMUNITY): Payer: 59 | Admitting: Occupational Therapy

## 2022-10-29 DIAGNOSIS — M25612 Stiffness of left shoulder, not elsewhere classified: Secondary | ICD-10-CM

## 2022-10-29 DIAGNOSIS — M25512 Pain in left shoulder: Secondary | ICD-10-CM

## 2022-10-29 DIAGNOSIS — R29898 Other symptoms and signs involving the musculoskeletal system: Secondary | ICD-10-CM | POA: Diagnosis not present

## 2022-10-29 NOTE — Therapy (Signed)
OUTPATIENT OCCUPATIONAL THERAPY ORTHO TREATMENT   Patient Name: Travis [name] MRN: [id number] DOB:10/27/1960, 62 y.o., male Today's Date: 10/29/2022   END OF SESSION:  OT End of Session - 10/29/22 2127     Visit Number 9    Number of Visits 12    Date for OT Re-Evaluation 11/26/22    Authorization Type [employer] Employee-$40 copay    Progress Note Due on Visit 18    OT Start Time 1122    OT Stop Time 1201    OT Time Calculation (min) 39 min    Activity Tolerance Patient tolerated treatment well    Behavior During Therapy [facility] for tasks assessed/performed             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   Anxiety    Arthritis    Blood transfusion without reported diagnosis    Complication of anesthesia    bleed after last shoulder-had to stay overnight   Depression    Diabetes mellitus    Diabetic peripheral neuropathy (HCC) 03/28/2013   Diverticulosis    Dizziness    Hernia, inguinal    Hyperlipidemia    Hypertension    Impingement syndrome of left shoulder 08/02/2014   Ischemic colitis (HCC)    Multi-infarct state 10/14/2014   Neuropathy of lower extremity    Night sweats    every once in a while   Sleep apnea    no CPAP   Stroke (HCC) 02/2015   Syncope and collapse    Past Surgical History:  Procedure Laterality Date   BIOPSY  06/14/2019   Procedure: BIOPSY;  Surgeon: Sherrilyn Rist, MD;  Location: [facility];  Service: Gastroenterology;;   CERVICAL DISC SURGERY     COLON SURGERY  2003   1/3 removed for diverticulitis   COLONOSCOPY     COLONOSCOPY WITH PROPOFOL N/A 06/14/2019   Procedure: COLONOSCOPY WITH PROPOFOL;  Surgeon: Sherrilyn Rist, MD;  Location: [facility];  Service: Gastroenterology;  Laterality: N/A;   EP IMPLANTABLE DEVICE N/A 05/01/2015   Procedure: Loop Recorder Insertion;  Surgeon: Duke Salvia, MD;  Location: [facility] INVASIVE  CV LAB;  Service: Cardiovascular;  Laterality: N/A;   INGUINAL HERNIA REPAIR Bilateral    KNEE ARTHROSCOPY Right    SHOULDER ARTHROSCOPY Right    1   SHOULDER ARTHROSCOPY Left 08/02/2014   Procedure: LEFT SHOULDER SCOPE DEBRIDEMENT/ACROMIOPLASTY;  Surgeon: Teryl Lucy, MD;  Location: [facility];  Service: Orthopedics;  Laterality: Left;  ANESTHESIA: GENERAL, PRE/POST OP SCALENE   TEE WITHOUT CARDIOVERSION N/A 03/03/2015   Procedure: TRANSESOPHAGEAL ECHOCARDIOGRAM (TEE);  Surgeon: Laqueta Linden, MD;  Location: [facility];  Service: Cardiology;  Laterality: N/A;   UMBILICAL HERNIA REPAIR     with other hernia repair with mesh   WRIST SURGERY Right    fusion   Patient Active Problem List   Diagnosis Date Noted   Hypercoagulable state due to paroxysmal atrial fibrillation (HCC) 07/19/2022   Nontraumatic incomplete tear of left rotator cuff 07/07/2022   Arthritis of left acromioclavicular joint 07/07/2022   Right groin hernia 03/03/2021   Hyperlipidemia associated with type 2 diabetes mellitus (HCC) 12/25/2020   Tendinopathy of right rotator cuff 09/25/2020   Arthrosis of right acromioclavicular joint 09/25/2020   Adrenal adenoma, left 09/07/2019   Chronic anticoagulation    GI bleed 06/14/2019  Vitamin D deficiency 04/18/2019   History of stroke 04/20/2018   GAD (generalized anxiety disorder) 05/15/2016   Acute bronchiolitis due to respiratory syncytial virus (RSV)    Acute CVA (cerebrovascular accident) (HCC) 05/04/2016   Dizziness 05/02/2016   Anxiety and depression 05/02/2016   Paroxysmal atrial fibrillation (HCC) 03/02/2016   PFO (patent foramen ovale) 07/24/2015   OSA (obstructive sleep apnea) 04/18/2015   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 artery (HCC) 04/18/2015   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   Snoring 01/23/2015   Degeneration of cervical intervertebral disc 01/23/2015   Essential hypertension 12/14/2014   DM type 2  causing vascular disease (HCC) 12/14/2014   Neck pain 12/14/2014   Cerebral infarction, chronic    Multi-infarct state 10/14/2014   Impingement syndrome of left shoulder 08/02/2014   Adhesive capsulitis of left shoulder 08/02/2014   Rectal bleeding 03/13/2014   Diabetes type 2, controlled (HCC) 02/18/2014   Diabetic peripheral neuropathy (HCC) 03/28/2013   Irreducible incisional hernia 11/11/2010    PCP: Dr. Lilyan Punt REFERRING PROVIDER: Jari Sportsman, PA-C  ONSET DATE: 09/01/22  REFERRING DIAG: S/P arthroscopy of left shoulder   THERAPY DIAG:  Other symptoms and signs involving the musculoskeletal system  Acute pain of left shoulder  Stiffness of left shoulder, not elsewhere classified  Rationale for Evaluation and Treatment: Rehabilitation  SUBJECTIVE:   SUBJECTIVE STATEMENT: S: "Both of my shoulders are hurting today."   PERTINENT HISTORY: Pt is a 62 y/o male s/p S/P arthroscopy of left shoulder on 09/01/22. Pt presents without sling, reports he is no longer wearing.   PRECAUTIONS: Shoulder-AA/ROM progress as tolerated.   WEIGHT BEARING RESTRICTIONS: Yes NWB  PAIN:  Are you having pain? Yes: NPRS scale: 6/10 Pain location: both shoulder Pain description: aching Aggravating factors: movement, reaching up Relieving factors: rest  FALLS: Has patient fallen in last 6 months? No  PATIENT GOALS: To be able to use the LUE during ADLs.   NEXT MD VISIT: 10/12/22  OBJECTIVE:   HAND DOMINANCE: Right  ADLs: Overall ADLs: Pt is having difficulty with reaching up overhead, behind back. Pt is unable to sleep on the left side, wakes up at night due to pain. Pt is having difficulty with dressing, bathing and reaching head and back. Pt is unable to operate belt buckles.   FUNCTIONAL OUTCOME MEASURES: FOTO: 49/100 10/08/22: 53/100 10/22/22: 59.60/100  UPPER EXTREMITY ROM:       Assessed in supine, er/IR adducted  Passive ROM Left eval Left 10/08/22  Shoulder flexion  129 146  Shoulder abduction 113 155  Shoulder internal rotation 90 90  Shoulder external rotation 64 73  (Blank rows = not tested)    Assessed in seated, er/IR adducted    Active ROM Left eval Left 10/08/22 Left 10/22/22  Shoulder flexion 81 109 116  Shoulder abduction 48 102 124  Shoulder internal rotation 90 90 90  Shoulder external rotation 62 70 88  (Blank rows = not tested)  UPPER EXTREMITY MMT:     Not tested due to precautions.   MMT Left eval Left 10/08/22 Left 10/22/22  Shoulder flexion  3/5 3+/5  Shoulder abduction  3/5 3/5  Shoulder internal rotation  4/5 4-/5  Shoulder external rotation  4-/5 4/5  (Blank rows = not tested)  SENSATION: Pt reports dullness in anterior shoulder region  OBSERVATIONS: mod fascial restrictions along upper arm, anterior shoulder, and trapezius regions   TODAY'S TREATMENT:  DATE:  10/29/22 -A/ROM: seated, flexion, abduction, protraction, horizontal abduction, er/IR, x15 -Manual Therapy: myofascial release and trigger point applied to biceps, deltoid, trapezius, and axillary region in order to reduce pain and fascial restriction to improve ROM. -Shoulder strengthening: 3lb dumbbells, flexion, abduction, protraction, horizontal abduction, er/IR, x12 -Scapular Strengthening: blue band, extension, retraction, protraction, rows, x12 -Shoulder Strengthening: green band, er/IR, flexion, red band, horizontal abduction, abduction, x10 -Ball on the Wall: flexion, abduction, x10  10/19/22 -Ball on the Wall: flexion, abduction, x10 -A/ROM: seated, flexion, abduction, protraction, horizontal abduction, er/IR, x10 -Proximal Shoulder Strengthening: 1lb dumbbells, paddles, criss cross, circles both directions, x10 -Scapular Strengthening: green band, extension, retraction, protraction, rows, x15 -Shoulder strengthening: 2lb  dumbbell, flexion, protraction, er/IR x10 -Reassessment Measurements  10/15/22 -P/ROM: flexion, abduction, er/IR, horizontal abduction, 5 reps -A/ROM: supine-protraction, flexion, abduction, er/IR, horizontal abduction, 15 reps -Proximal shoulder strengthening: supine-paddles, criss cross, circles each direction, 12 reps each -A/ROM: standing-protraction, flexion, er/IR, horizontal abduction with 3 breaks, abduction, 12 reps -Proximal shoulder strengthening on doorway, shoulder at 90 degrees flexion, 1'  -Scapular theraband: green-row, extension, retraction, 12 reps each -Functional Reaching: pt standing at kitchen cabinets, reaching into abduction to remove items from middle shelf, turning and reaching in flexion to place items from bottom shelf to middle shelf, then reaching in abduction to place items on counter onto bottom shelf -Overhead lacing: seated, lacing from top down then reversing to remove -UBE: Level 1, 3' forward 2' reverse, pace: 7.5   PATIENT EDUCATION: Education details: Public relations account executive Person educated: Patient Education method: Explanation, Demonstration, and Handouts Education comprehension: verbalized understanding and returned demonstration  HOME EXERCISE PROGRAM: Eval: table slides 5/24: AA/ROM 5/31: A/ROM  6/18: Scapular Strengthening 6/28: Shoulder strengthening  GOALS: Goals reviewed with patient? Yes   SHORT TERM GOALS: Target date: 10/08/22  Pt will be provided with and educated on HEP to improve mobility in LUE required for use during ADL completion.   Goal status: IN PROGRESS  2.  Pt will increase LUE P/ROM by 30 degrees or greater to improve ability to use LUE during dressing tasks with minimal compensatory techniques.   Goal status: MET  3.  Pt will increase LUE strength to 3+/5 to improve ability to reach for items at waist to chest height during bathing and grooming tasks.   Goal status: IN PROGRESS    LONG TERM GOALS: Target  date: 10/29/22  Pt will decrease pain in LUE to 4/10 or less to improve ability to sleep for 2+ consecutive hours without waking due to pain.   Goal status: IN PROGRESS  2.  Pt will decrease LUE fascial restrictions to min amounts or less to improve mobility required for functional reaching tasks such as threading belt or reaching head.  Goal status: IN PROGRESS  3.  Pt will increase LUE A/ROM to at least 140 degrees flexion and abduction, 60 er, to improve ability to use LUE when reaching overhead or behind back during dressing and bathing tasks.   Goal status: IN PROGRESS  4.  Pt will increase LUE strength to 4/5 or greater to improve ability to use LUE when lifting or carrying items during meal preparation/housework/yardwork tasks.   Goal status: IN PROGRESS  5.  Pt will return to highest level of function using LUE as non-dominant during functional task completion.   Goal status: IN PROGRESS   ASSESSMENT:  CLINICAL IMPRESSION: This session pt reaching approximately 75% of full ROM. OT had pt working on strengthening exercises this session with both  dumbbells and resistance bands. He tolerated the exercises fair, however he required multiple breaks due to fatigue. With abduction and horizontal abduction, he was unable to tolerate the green band, therefore OT decreased the resistance to the red band for task completion. Verbal and visual cuing provided throughout for positioning and technique.   PERFORMANCE DEFICITS: in functional skills including in functional skills including ADLs, IADLs, coordination, tone, ROM, strength, pain, fascial restrictions, muscle spasms, and UE functional use    PLAN:  OT FREQUENCY: 2x/week  OT DURATION: 6 weeks  PLANNED INTERVENTIONS: self care/ADL training, therapeutic exercise, therapeutic activity, neuromuscular re-education, manual therapy, passive range of motion, splinting, electrical stimulation, ultrasound, moist heat, cryotherapy,  patient/family education, and DME and/or AE instructions  CONSULTED AND AGREED WITH PLAN OF CARE: Patient  PLAN FOR NEXT SESSION: continue with A/ROM, working towards shoulder strengthening and stability as able   Trish Mage, OTR/L [phone] 10/29/2022, 9:29 PM

## 2022-10-29 NOTE — Patient Instructions (Signed)

## 2022-10-30 ENCOUNTER — Other Ambulatory Visit (HOSPITAL_COMMUNITY): Payer: Self-pay

## 2022-11-02 ENCOUNTER — Encounter (HOSPITAL_COMMUNITY): Payer: 59 | Admitting: Occupational Therapy

## 2022-11-08 ENCOUNTER — Telehealth: Payer: Self-pay | Admitting: Family Medicine

## 2022-11-08 NOTE — Telephone Encounter (Signed)
Patient needs updated letter about his health issues that he is unable to take care of toddler or young child when wife is at work. I put old letter in your folder for review along with wife needed added to letter also.

## 2022-11-09 ENCOUNTER — Ambulatory Visit (HOSPITAL_COMMUNITY): Payer: 59 | Attending: Physician Assistant | Admitting: Occupational Therapy

## 2022-11-09 ENCOUNTER — Encounter (HOSPITAL_COMMUNITY): Payer: Self-pay | Admitting: Occupational Therapy

## 2022-11-09 DIAGNOSIS — M25512 Pain in left shoulder: Secondary | ICD-10-CM | POA: Insufficient documentation

## 2022-11-09 DIAGNOSIS — M25612 Stiffness of left shoulder, not elsewhere classified: Secondary | ICD-10-CM | POA: Insufficient documentation

## 2022-11-09 DIAGNOSIS — R29898 Other symptoms and signs involving the musculoskeletal system: Secondary | ICD-10-CM | POA: Insufficient documentation

## 2022-11-09 NOTE — Therapy (Signed)
OUTPATIENT OCCUPATIONAL THERAPY ORTHO TREATMENT   Patient Name: Travis [name] MRN: [id number] DOB:June 13, 1960, 62 y.o., male Today's Date: 11/09/2022   END OF SESSION:  OT End of Session - 11/09/22 1155     Visit Number 10    Number of Visits 12    Date for OT Re-Evaluation 11/26/22    Authorization Type [employer] Employee-$40 copay    Progress Note Due on Visit 18    OT Start Time 1119    OT Stop Time 1158    OT Time Calculation (min) 39 min    Activity Tolerance Patient tolerated treatment well    Behavior During Therapy WFL for tasks assessed/performed              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   Anxiety    Arthritis    Blood transfusion without reported diagnosis    Complication of anesthesia    bleed after last shoulder-had to stay overnight   Depression    Diabetes mellitus    Diabetic peripheral neuropathy (HCC) 03/28/2013   Diverticulosis    Dizziness    Hernia, inguinal    Hyperlipidemia    Hypertension    Impingement syndrome of left shoulder 08/02/2014   Ischemic colitis (HCC)    Multi-infarct state 10/14/2014   Neuropathy of lower extremity    Night sweats    every once in a while   Sleep apnea    no CPAP   Stroke (HCC) 02/2015   Syncope and collapse    Past Surgical History:  Procedure Laterality Date   BIOPSY  06/14/2019   Procedure: BIOPSY;  Surgeon: Sherrilyn Rist, MD;  Location: [facility];  Service: Gastroenterology;;   CERVICAL DISC SURGERY     COLON SURGERY  2003   1/3 removed for diverticulitis   COLONOSCOPY     COLONOSCOPY WITH PROPOFOL N/A 06/14/2019   Procedure: COLONOSCOPY WITH PROPOFOL;  Surgeon: Sherrilyn Rist, MD;  Location: [facility];  Service: Gastroenterology;  Laterality: N/A;   EP IMPLANTABLE DEVICE N/A 05/01/2015   Procedure: Loop Recorder Insertion;  Surgeon: Duke Salvia, MD;  Location: [facility]  INVASIVE CV LAB;  Service: Cardiovascular;  Laterality: N/A;   INGUINAL HERNIA REPAIR Bilateral    KNEE ARTHROSCOPY Right    SHOULDER ARTHROSCOPY Right    1   SHOULDER ARTHROSCOPY Left 08/02/2014   Procedure: LEFT SHOULDER SCOPE DEBRIDEMENT/ACROMIOPLASTY;  Surgeon: Teryl Lucy, MD;  Location:  SUR[facility]ERY CENTER;  Service: Orthopedics;  Laterality: Left;  ANESTHESIA: GENERAL, PRE/POST OP SCALENE   TEE WITHOUT CARDIOVERSION N/A 03/03/2015   Procedure: TRANSESOPHAGEAL ECHOCARDIOGRAM (TEE);  Surgeon: Laqueta Linden, MD;  Location: [facility];  Service: Cardiology;  Laterality: N/A;   UMBILICAL HERNIA REPAIR     with other hernia repair with mesh   WRIST SURGERY Right    fusion   Patient Active Problem List   Diagnosis Date Noted   Hypercoagulable state due to paroxysmal atrial fibrillation (HCC) 07/19/2022   Nontraumatic incomplete tear of left rotator cuff 07/07/2022   Arthritis of left acromioclavicular joint 07/07/2022   Right groin hernia 03/03/2021   Hyperlipidemia associated with type 2 diabetes mellitus (HCC) 12/25/2020   Tendinopathy of right rotator cuff 09/25/2020   Arthrosis of right acromioclavicular joint 09/25/2020   Adrenal adenoma, left 09/07/2019   Chronic anticoagulation    GI bleed 06/14/2019  Vitamin D deficiency 04/18/2019   History of stroke 04/20/2018   GAD (generalized anxiety disorder) 05/15/2016   Acute bronchiolitis due to respiratory syncytial virus (RSV)    Acute CVA (cerebrovascular accident) (HCC) 05/04/2016   Dizziness 05/02/2016   Anxiety and depression 05/02/2016   Paroxysmal atrial fibrillation (HCC) 03/02/2016   PFO (patent foramen ovale) 07/24/2015   OSA (obstructive sleep apnea) 04/18/2015   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 artery (HCC) 04/18/2015   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 Lilyan Punt REFERRING PROVIDER: Jari Sportsman, PA-C  ONSET DATE: 09/01/22  REFERRING DIAG: S/P arthroscopy of left shoulder   THERAPY DIAG:  Other symptoms and signs involving the musculoskeletal system  Acute pain of left shoulder  Stiffness of left shoulder, not elsewhere classified  Rationale for Evaluation and Treatment: Rehabilitation  SUBJECTIVE:   SUBJECTIVE STATEMENT: S: "My hand is hurting worse than my shoulder."   PERTINENT HISTORY: Pt is a 62 y/o male s/p S/P arthroscopy of left shoulder on 09/01/22. Pt presents without sling, reports he is no longer wearing.   PRECAUTIONS: Shoulder-AA/ROM progress as tolerated.   WEIGHT BEARING RESTRICTIONS: Yes NWB  PAIN:  Are you having pain? Yes: NPRS scale: 6/10 Pain location: both shoulder Pain description: aching Aggravating factors: movement, reaching up Relieving factors: rest  FALLS: Has patient fallen in last 6 months? No  PATIENT GOALS: To be able to use the LUE during ADLs.   NEXT MD VISIT: 11/23/22  OBJECTIVE:   HAND DOMINANCE: Right  ADLs: Overall ADLs: Pt is having difficulty with reaching up overhead, behind back. Pt is unable to sleep on the left side, wakes up at night due to pain. Pt is having difficulty with dressing, bathing and reaching head and back. Pt is unable to operate belt buckles.   FUNCTIONAL OUTCOME MEASURES: FOTO: 49/100 10/08/22: 53/100 10/22/22: 59.60/100  UPPER EXTREMITY ROM:       Assessed in supine, er/IR adducted  Passive ROM Left eval Left 10/08/22  Shoulder  flexion 129 146  Shoulder abduction 113 155  Shoulder internal rotation 90 90  Shoulder external rotation 64 73  (Blank rows = not tested)    Assessed in seated, er/IR adducted    Active ROM Left eval Left 10/08/22 Left 10/22/22 Left 11/09/22  Shoulder flexion 81 109 116 132  Shoulder abduction 48 102 124 144  Shoulder internal rotation 90 90 90 90  Shoulder external rotation 62 70 88 85  (Blank rows = not tested)  UPPER EXTREMITY MMT:     Not tested due to precautions.   MMT Left eval Left 10/08/22 Left 10/22/22 Left 11/09/22  Shoulder flexion  3/5 3+/5 3+/5  Shoulder abduction  3/5 3/5 3/5  Shoulder internal rotation  4/5 4-/5 4/5  Shoulder external rotation  4-/5 4/5 4-/5  (Blank rows = not tested)  SENSATION: Pt reports dullness in anterior shoulder region  OBSERVATIONS: mod fascial restrictions along upper arm, anterior shoulder, and trapezius regions  TODAY'S TREATMENT:                                                                                                                              DATE: 11/09/22 -Sidelying strengthening: 1#-protraction, flexion, abduction, er, horizontal abduction, 12 reps -ABC writing: 1#-arm at 90 degrees flexion, 6 rest breaks -Shoulder strengthening: 1#-protraction, er; A/ROM- flexion, abduction, horizontal abduction, 12 reps -Ball on wall: 1' abduction -Overhead lacing: seated-lacing from top down then reversing -Scapular strengthening: green theraband-row, extension, retraction, 15 reps -UBE: level 2, 2' reverse, pace: 10.0  10/29/22 -A/ROM: seated, flexion, abduction, protraction, horizontal abduction, er/IR, x15 -Manual Therapy: myofascial release and trigger point applied to biceps, deltoid, trapezius, and axillary region in order to reduce pain and fascial restriction to improve ROM. -Shoulder strengthening: 3lb dumbbells, flexion, abduction, protraction, horizontal abduction, er/IR, x12 -Scapular Strengthening: blue band,  extension, retraction, protraction, rows, x12 -Shoulder Strengthening: green band, er/IR, flexion, red band, horizontal abduction, abduction, x10 -Ball on the Wall: flexion, abduction, x10  10/19/22 -Ball on the Wall: flexion, abduction, x10 -A/ROM: seated, flexion, abduction, protraction, horizontal abduction, er/IR, x10 -Proximal Shoulder Strengthening: 1lb dumbbells, paddles, criss cross, circles both directions, x10 -Scapular Strengthening: green band, extension, retraction, protraction, rows, x15 -Shoulder strengthening: 2lb dumbbell, flexion, protraction, er/IR x10 -Reassessment Measurements   PATIENT EDUCATION: Education details: continue with shoulder strengthening Person educated: Patient Education method: Explanation, Demonstration, and Handouts Education comprehension: verbalized understanding and returned demonstration  HOME EXERCISE PROGRAM: Eval: table slides 5/24: AA/ROM 5/31: A/ROM  6/18: Scapular Strengthening 6/28: Shoulder strengthening  GOALS: Goals reviewed with patient? Yes   SHORT TERM GOALS: Target date: 10/08/22  Pt will be provided with and educated on HEP to improve mobility in LUE required for use during ADL completion.   Goal status: IN PROGRESS  2.  Pt will increase LUE P/ROM by 30 degrees or greater to improve ability to use LUE during dressing tasks with minimal compensatory techniques.   Goal status: MET  3.  Pt will increase LUE strength to 3+/5 to improve ability to reach for items at waist to chest height during bathing and grooming tasks.   Goal status: IN PROGRESS    LONG TERM GOALS: Target date: 10/29/22  Pt will decrease pain in LUE to 4/10 or less to improve ability to sleep for 2+ consecutive hours without waking due to pain.   Goal status: IN PROGRESS  2.  Pt will decrease LUE fascial restrictions to min amounts or less to improve mobility required for functional reaching tasks such as threading belt or reaching head.  Goal  status: IN PROGRESS  3.  Pt will increase LUE A/ROM to at least 140 degrees flexion and abduction, 60 er, to improve ability to use LUE when reaching overhead or behind back during dressing and bathing tasks.   Goal status: IN PROGRESS  4.  Pt will increase LUE strength to 4/5 or greater to improve ability to use LUE when lifting  or carrying items during meal preparation/housework/yardwork tasks.   Goal status: IN PROGRESS  5.  Pt will return to highest level of function using LUE as non-dominant during functional task completion.   Goal status: IN PROGRESS   ASSESSMENT:  CLINICAL IMPRESSION: Pt reporting his hand is hurting worse than his shoulder. Updated measurements taken today, pt is making progress with ROM, strength continues to be limited. Added sidelying strengthening using low weight, continued with theraband work, added ABC writing-multiple rest breaks required. Continued with standing shoulder strengthening, reducing to 1# weights then A/ROM for optimal form. Discussed HEP with pt, who reports he is doing exercises, upon further discussion pt is doing reaching into cabinets and wiping walls or cabinets but not his provided HEP. Discussed importance of completing the HEP daily, to improve on needed ROM and strength required for improved LUE use. Verbal and visual cuing provided throughout for positioning and technique, session focusing on form versus speed.   PERFORMANCE DEFICITS: in functional skills including in functional skills including ADLs, IADLs, coordination, tone, ROM, strength, pain, fascial restrictions, muscle spasms, and UE functional use   PLAN:  OT FREQUENCY: 2x/week  OT DURATION: 6 weeks  PLANNED INTERVENTIONS: self care/ADL training, therapeutic exercise, therapeutic activity, neuromuscular re-education, manual therapy, passive range of motion, splinting, electrical stimulation, ultrasound, moist heat, cryotherapy, patient/family education, and DME and/or AE  instructions  CONSULTED AND AGREED WITH PLAN OF CARE: Patient  PLAN FOR NEXT SESSION: continue shoulder strengthening and stability work, follow up on HEP completion     Ezra Sites, OTR/L  [phone] 11/09/2022, 11:59 AM

## 2022-11-12 ENCOUNTER — Ambulatory Visit (HOSPITAL_COMMUNITY): Payer: 59 | Admitting: Occupational Therapy

## 2022-11-12 ENCOUNTER — Encounter (HOSPITAL_COMMUNITY): Payer: Self-pay | Admitting: Occupational Therapy

## 2022-11-12 DIAGNOSIS — R29898 Other symptoms and signs involving the musculoskeletal system: Secondary | ICD-10-CM

## 2022-11-12 DIAGNOSIS — M25612 Stiffness of left shoulder, not elsewhere classified: Secondary | ICD-10-CM

## 2022-11-12 DIAGNOSIS — M25512 Pain in left shoulder: Secondary | ICD-10-CM | POA: Diagnosis not present

## 2022-11-12 NOTE — Telephone Encounter (Signed)
[name] called to check on letter for patient ,she is needing it as soon as possible please.

## 2022-11-12 NOTE — Therapy (Signed)
OUTPATIENT OCCUPATIONAL THERAPY ORTHO TREATMENT   Patient Name: Travis [name] MRN: [id number] DOB:24-Jan-1961, 62 y.o., male Today's Date: 11/12/2022   END OF SESSION:  OT End of Session - 11/12/22 1338     Visit Number 11    Number of Visits 12    Date for OT Re-Evaluation 11/26/22    Authorization Type [employer]-$40 copay    Progress Note Due on Visit 18    OT Start Time 1300    OT Stop Time 1342    OT Time Calculation (min) 42 min    Activity Tolerance Patient tolerated treatment well    Behavior During Therapy WFL for tasks assessed/performed               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   Anxiety    Arthritis    Blood transfusion without reported diagnosis    Complication of anesthesia    bleed after last shoulder-had to stay overnight   Depression    Diabetes mellitus    Diabetic peripheral neuropathy (HCC) 03/28/2013   Diverticulosis    Dizziness    Hernia, inguinal    Hyperlipidemia    Hypertension    Impingement syndrome of left shoulder 08/02/2014   Ischemic colitis (HCC)    Multi-infarct state 10/14/2014   Neuropathy of lower extremity    Night sweats    every once in a while   Sleep apnea    no CPAP   Stroke (HCC) 02/2015   Syncope and collapse    Past Surgical History:  Procedure Laterality Date   BIOPSY  06/14/2019   Procedure: BIOPSY;  Surgeon: Sherrilyn Rist, MD;  Location: [facility];  Service: Gastroenterology;;   CERVICAL DISC SURGERY     COLON SURGERY  2003   1/3 removed for diverticulitis   COLONOSCOPY     COLONOSCOPY WITH PROPOFOL N/A 06/14/2019   Procedure: COLONOSCOPY WITH PROPOFOL;  Surgeon: Sherrilyn Rist, MD;  Location: [facility];  Service: Gastroenterology;  Laterality: N/A;   EP IMPLANTABLE DEVICE N/A 05/01/2015   Procedure: Loop Recorder Insertion;  Surgeon: Duke Salvia, MD;  Location: [facility]  INVASIVE CV LAB;  Service: Cardiovascular;  Laterality: N/A;   INGUINAL HERNIA REPAIR Bilateral    KNEE ARTHROSCOPY Right    SHOULDER ARTHROSCOPY Right    1   SHOULDER ARTHROSCOPY Left 08/02/2014   Procedure: LEFT SHOULDER SCOPE DEBRIDEMENT/ACROMIOPLASTY;  Surgeon: Teryl Lucy, MD;  Location: [facility];  Service: Orthopedics;  Laterality: Left;  ANESTHESIA: GENERAL, PRE/POST OP SCALENE   TEE WITHOUT CARDIOVERSION N/A 03/03/2015   Procedure: TRANSESOPHAGEAL ECHOCARDIOGRAM (TEE);  Surgeon: Laqueta Linden, MD;  Location: [facility];  Service: Cardiology;  Laterality: N/A;   UMBILICAL HERNIA REPAIR     with other hernia repair with mesh   WRIST SURGERY Right    fusion   Patient Active Problem List   Diagnosis Date Noted   Hypercoagulable state due to paroxysmal atrial fibrillation (HCC) 07/19/2022   Nontraumatic incomplete tear of left rotator cuff 07/07/2022   Arthritis of left acromioclavicular joint 07/07/2022   Right groin hernia 03/03/2021   Hyperlipidemia associated with type 2 diabetes mellitus (HCC) 12/25/2020   Tendinopathy of right rotator cuff 09/25/2020   Arthrosis of right acromioclavicular joint 09/25/2020   Adrenal adenoma, left 09/07/2019   Chronic anticoagulation    GI bleed 06/14/2019  Vitamin D deficiency 04/18/2019   History of stroke 04/20/2018   GAD (generalized anxiety disorder) 05/15/2016   Acute bronchiolitis due to respiratory syncytial virus (RSV)    Acute CVA (cerebrovascular accident) (HCC) 05/04/2016   Dizziness 05/02/2016   Anxiety and depression 05/02/2016   Paroxysmal atrial fibrillation (HCC) 03/02/2016   PFO (patent foramen ovale) 07/24/2015   OSA (obstructive sleep apnea) 04/18/2015   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 artery (HCC) 04/18/2015   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 Lilyan Punt REFERRING PROVIDER: Jari Sportsman, PA-C  ONSET DATE: 09/01/22  REFERRING DIAG: S/P arthroscopy of left shoulder   THERAPY DIAG:  Acute pain of left shoulder  Stiffness of left shoulder, not elsewhere classified  Other symptoms and signs involving the musculoskeletal system  Rationale for Evaluation and Treatment: Rehabilitation  SUBJECTIVE:   SUBJECTIVE STATEMENT: S: "That thing on my hand aint helping"   PERTINENT HISTORY: Pt is a 62 y/o male s/p S/P arthroscopy of left shoulder on 09/01/22. Pt presents without sling, reports he is no longer wearing.   PRECAUTIONS: Shoulder-AA/ROM progress as tolerated.   WEIGHT BEARING RESTRICTIONS: Yes NWB  PAIN:  Are you having pain? Yes: NPRS scale: 4/10 Pain location: both shoulder Pain description: aching Aggravating factors: movement, reaching up Relieving factors: rest  FALLS: Has patient fallen in last 6 months? No  PATIENT GOALS: To be able to use the LUE during ADLs.   NEXT MD VISIT: 11/23/22  OBJECTIVE:   HAND DOMINANCE: Right  ADLs: Overall ADLs: Pt is having difficulty with reaching up overhead, behind back. Pt is unable to sleep on the left side, wakes up at night due to pain. Pt is having difficulty with dressing, bathing and reaching head and back. Pt is unable to operate belt buckles.   FUNCTIONAL OUTCOME MEASURES: FOTO: 49/100 10/08/22: 53/100 10/22/22: 59.60/100  UPPER EXTREMITY ROM:       Assessed in supine, er/IR adducted  Passive ROM Left eval Left 10/08/22  Shoulder flexion  129 146  Shoulder abduction 113 155  Shoulder internal rotation 90 90  Shoulder external rotation 64 73  (Blank rows = not tested)    Assessed in seated, er/IR adducted    Active ROM Left eval Left 10/08/22 Left 10/22/22 Left 11/09/22  Shoulder flexion 81 109 116 132  Shoulder abduction 48 102 124 144  Shoulder internal rotation 90 90 90 90  Shoulder external rotation 62 70 88 85  (Blank rows = not tested)  UPPER EXTREMITY MMT:     Not tested due to precautions.   MMT Left eval Left 10/08/22 Left 10/22/22 Left 11/09/22  Shoulder flexion  3/5 3+/5 3+/5  Shoulder abduction  3/5 3/5 3/5  Shoulder internal rotation  4/5 4-/5 4/5  Shoulder external rotation  4-/5 4/5 4-/5  (Blank rows = not tested)  SENSATION: Pt reports dullness in anterior shoulder region  OBSERVATIONS: mod fascial restrictions along upper arm, anterior shoulder, and trapezius regions   TODAY'S  TREATMENT:                                                                                                                              DATE: 11/12/22 - P/ROM: shoulder flexion, abduction, external rotation 10x  - A/ROM supine: shoulder flexion, abduction, external rotation 10x  - A/ROM seated: shoulder flexion, abduction, external rotation 10x -Shoulder strengthening: 1#-protraction, er; A/ROM- flexion, abduction, horizontal abduction, 12 reps -Scapular strengthening: green theraband-row, extension, retraction, 15 reps -UBE: level 2, 2' reverse, pace: 14.0   11/09/22 -Sidelying strengthening: 1#-protraction, flexion, abduction, er, horizontal abduction, 12 reps -ABC writing: 1#-arm at 90 degrees flexion, 6 rest breaks -Shoulder strengthening: 1#-protraction, er; A/ROM- flexion, abduction, horizontal abduction, 12 reps -Ball on wall: 1' abduction -Overhead lacing: seated-lacing from top down then reversing -Scapular strengthening: green theraband-row, extension, retraction, 15 reps -UBE: level 2, 2' reverse, pace:  10.0  10/29/22 -A/ROM: seated, flexion, abduction, protraction, horizontal abduction, er/IR, x15 -Manual Therapy: myofascial release and trigger point applied to biceps, deltoid, trapezius, and axillary region in order to reduce pain and fascial restriction to improve ROM. -Shoulder strengthening: 3lb dumbbells, flexion, abduction, protraction, horizontal abduction, er/IR, x12 -Scapular Strengthening: blue band, extension, retraction, protraction, rows, x12 -Shoulder Strengthening: green band, er/IR, flexion, red band, horizontal abduction, abduction, x10 -Ball on the Wall: flexion, abduction, x10   PATIENT EDUCATION: Education details: continue with shoulder strengthening Person educated: Patient Education method: Explanation, Demonstration, and Handouts Education comprehension: verbalized understanding and returned demonstration  HOME EXERCISE PROGRAM: Eval: table slides 5/24: AA/ROM 5/31: A/ROM  6/18: Scapular Strengthening 6/28: Shoulder strengthening  GOALS: Goals reviewed with patient? Yes   SHORT TERM GOALS: Target date: 10/08/22  Pt will be provided with and educated on HEP to improve mobility in LUE required for use during ADL completion.   Goal status: IN PROGRESS  2.  Pt will increase LUE P/ROM by 30 degrees or greater to improve ability to use LUE during dressing tasks with minimal compensatory techniques.   Goal status: MET  3.  Pt will increase LUE strength to 3+/5 to improve ability to reach for items at waist to chest height during bathing and grooming tasks.   Goal status: IN PROGRESS    LONG TERM GOALS: Target date: 10/29/22  Pt will decrease pain in LUE to 4/10 or less to improve ability to sleep for 2+ consecutive hours without waking due to pain.   Goal status: IN PROGRESS  2.  Pt will decrease LUE fascial restrictions to min amounts or less to improve mobility required for functional reaching tasks such as threading belt or reaching head.  Goal  status: IN PROGRESS  3.  Pt will increase LUE A/ROM to at least 140 degrees flexion and abduction, 60 er, to improve ability to use LUE when reaching overhead or behind back during dressing and bathing tasks.   Goal status: IN PROGRESS  4.  Pt will increase LUE strength to 4/5 or greater to  improve ability to use LUE when lifting or carrying items during meal preparation/housework/yardwork tasks.   Goal status: IN PROGRESS  5.  Pt will return to highest level of function using LUE as non-dominant during functional task completion.   Goal status: IN PROGRESS   ASSESSMENT:  CLINICAL IMPRESSION: Pt reports continued pain in hand. He stated that his shoulder has felt sore lately. He reports that he has been completing his HEP infrequently due to soreness. He stated that he is still unable to to abduct shoulder to reach into cabinets and complete certain HEP exercises- he stated that it is not painful but he physically unable to raise it any further. Began session with ROM. Continued to target shoulder strength/stability/endurance. Assisting pt with reaching full ROM with weighted ROM exercises (flexion/abduction). VC for form and rest breaks given  throughout.   PERFORMANCE DEFICITS: in functional skills including in functional skills including ADLs, IADLs, coordination, tone, ROM, strength, pain, fascial restrictions, muscle spasms, and UE functional use   PLAN:  OT FREQUENCY: 2x/week  OT DURATION: 6 weeks  PLANNED INTERVENTIONS: self care/ADL training, therapeutic exercise, therapeutic activity, neuromuscular re-education, manual therapy, passive range of motion, splinting, electrical stimulation, ultrasound, moist heat, cryotherapy, patient/family education, and DME and/or AE instructions  CONSULTED AND AGREED WITH PLAN OF CARE: Patient  PLAN FOR NEXT SESSION: continue shoulder strengthening and stability work, follow up on HEP completion     Lurena Joiner, OTR/L   [phone] 11/12/2022, 1:39 PM

## 2022-11-13 ENCOUNTER — Encounter: Payer: Self-pay | Admitting: Family Medicine

## 2022-11-13 NOTE — Progress Notes (Signed)
Please provide for family

## 2022-11-13 NOTE — Telephone Encounter (Signed)
Letter was dictated as requested 

## 2022-11-15 NOTE — Telephone Encounter (Signed)
done

## 2022-11-16 ENCOUNTER — Encounter (HOSPITAL_COMMUNITY): Payer: Self-pay | Admitting: Occupational Therapy

## 2022-11-16 ENCOUNTER — Ambulatory Visit (HOSPITAL_COMMUNITY): Payer: 59 | Admitting: Occupational Therapy

## 2022-11-16 DIAGNOSIS — R29898 Other symptoms and signs involving the musculoskeletal system: Secondary | ICD-10-CM | POA: Diagnosis not present

## 2022-11-16 DIAGNOSIS — M25612 Stiffness of left shoulder, not elsewhere classified: Secondary | ICD-10-CM

## 2022-11-16 DIAGNOSIS — M25512 Pain in left shoulder: Secondary | ICD-10-CM

## 2022-11-16 NOTE — Addendum Note (Signed)
Addended by: Trish Mage E on: 11/16/2022 12:00 PM   Modules accepted: Orders

## 2022-11-16 NOTE — Therapy (Signed)
OUTPATIENT OCCUPATIONAL THERAPY ORTHO TREATMENT   Patient Name: Travis [name] MRN: [id number] DOB:07/30/1960, 62 y.o., male Today's Date: 11/16/2022   END OF SESSION:  OT End of Session - 11/16/22 1149     Visit Number 12    Number of Visits 15    Date for OT Re-Evaluation 11/26/22    Authorization Type [employer]-$40 copay    Progress Note Due on Visit 18    OT Start Time 1111    OT Stop Time 1152    OT Time Calculation (min) 41 min    Activity Tolerance Patient tolerated treatment well    Behavior During Therapy [facility] for tasks assessed/performed                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   Anxiety    Arthritis    Blood transfusion without reported diagnosis    Complication of anesthesia    bleed after last shoulder-had to stay overnight   Depression    Diabetes mellitus    Diabetic peripheral neuropathy (HCC) 03/28/2013   Diverticulosis    Dizziness    Hernia, inguinal    Hyperlipidemia    Hypertension    Impingement syndrome of left shoulder 08/02/2014   Ischemic colitis (HCC)    Multi-infarct state 10/14/2014   Neuropathy of lower extremity    Night sweats    every once in a while   Sleep apnea    no CPAP   Stroke (HCC) 02/2015   Syncope and collapse    Past Surgical History:  Procedure Laterality Date   BIOPSY  06/14/2019   Procedure: BIOPSY;  Surgeon: Sherrilyn Rist, MD;  Location: [facility];  Service: Gastroenterology;;   CERVICAL DISC SURGERY     COLON SURGERY  2003   1/3 removed for diverticulitis   COLONOSCOPY     COLONOSCOPY WITH PROPOFOL N/A 06/14/2019   Procedure: COLONOSCOPY WITH PROPOFOL;  Surgeon: Sherrilyn Rist, MD;  Location: [facility];  Service: Gastroenterology;  Laterality: N/A;   EP IMPLANTABLE DEVICE N/A 05/01/2015   Procedure: Loop Recorder Insertion;  Surgeon: Duke Salvia, MD;  Location: [facility]  INVASIVE CV LAB;  Service: Cardiovascular;  Laterality: N/A;   INGUINAL HERNIA REPAIR Bilateral    KNEE ARTHROSCOPY Right    SHOULDER ARTHROSCOPY Right    1   SHOULDER ARTHROSCOPY Left 08/02/2014   Procedure: LEFT SHOULDER SCOPE DEBRIDEMENT/ACROMIOPLASTY;  Surgeon: Teryl Lucy, MD;  Location: [facility];  Service: Orthopedics;  Laterality: Left;  ANESTHESIA: GENERAL, PRE/POST OP SCALENE   TEE WITHOUT CARDIOVERSION N/A 03/03/2015   Procedure: TRANSESOPHAGEAL ECHOCARDIOGRAM (TEE);  Surgeon: Laqueta Linden, MD;  [facility];  Service: Cardiology;  Laterality: N/A;   UMBILICAL HERNIA REPAIR     with other hernia repair with mesh   WRIST SURGERY Right    fusion   Patient Active Problem List   Diagnosis Date Noted   Hypercoagulable state due to paroxysmal atrial fibrillation (HCC) 07/19/2022   Nontraumatic incomplete tear of left rotator cuff 07/07/2022   Arthritis of left acromioclavicular joint 07/07/2022   Right groin hernia 03/03/2021   Hyperlipidemia associated with type 2 diabetes mellitus (HCC) 12/25/2020   Tendinopathy of right rotator cuff 09/25/2020   Arthrosis of right acromioclavicular joint 09/25/2020   Adrenal adenoma, left 09/07/2019   Chronic anticoagulation    GI bleed  06/14/2019   Vitamin D deficiency 04/18/2019   History of stroke 04/20/2018   GAD (generalized anxiety disorder) 05/15/2016   Acute bronchiolitis due to respiratory syncytial virus (RSV)    Acute CVA (cerebrovascular accident) (HCC) 05/04/2016   Dizziness 05/02/2016   Anxiety and depression 05/02/2016   Paroxysmal atrial fibrillation (HCC) 03/02/2016   PFO (patent foramen ovale) 07/24/2015   OSA (obstructive sleep apnea) 04/18/2015   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 artery (HCC) 04/18/2015   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 Lilyan Punt REFERRING PROVIDER: Jari Sportsman, PA-C  ONSET DATE: 09/01/22  REFERRING DIAG: S/P arthroscopy of left shoulder   THERAPY DIAG:  Acute pain of left shoulder  Stiffness of left shoulder, not elsewhere classified  Other symptoms and signs involving the musculoskeletal system  Rationale for Evaluation and Treatment: Rehabilitation  SUBJECTIVE:   SUBJECTIVE STATEMENT: S: "Honestly I've took it easy this week and it don't feel quite as sore."   PERTINENT HISTORY: Pt is a 62 y/o male s/p S/P arthroscopy of left shoulder on 09/01/22. Pt presents without sling, reports he is no longer wearing.   PRECAUTIONS: Shoulder-AA/ROM progress as tolerated.   WEIGHT BEARING RESTRICTIONS: Yes NWB  PAIN:  Are you having pain? Yes: NPRS scale: 3/10 Pain location: both shoulder Pain description: aching Aggravating factors: movement, reaching up Relieving factors: rest  FALLS: Has patient fallen in last 6 months? No  PATIENT GOALS: To be able to use the LUE during ADLs.   NEXT MD VISIT: 11/23/22  OBJECTIVE:   HAND DOMINANCE: Right  ADLs: Overall ADLs: Pt is having difficulty with reaching up overhead, behind back. Pt is unable to sleep on the left side, wakes up at night due to pain. Pt is having difficulty with dressing, bathing and reaching head and back. Pt is unable to operate belt buckles.   FUNCTIONAL OUTCOME MEASURES: FOTO: 49/100 10/08/22: 53/100 10/22/22: 59.60/100  UPPER EXTREMITY ROM:       Assessed in supine, er/IR adducted  Passive ROM  Left eval Left 10/08/22  Shoulder flexion 129 146  Shoulder abduction 113 155  Shoulder internal rotation 90 90  Shoulder external rotation 64 73  (Blank rows = not tested)    Assessed in seated, er/IR adducted    Active ROM Left eval Left 10/08/22 Left 10/22/22 Left 11/09/22  Shoulder flexion 81 109 116 132  Shoulder abduction 48 102 124 144  Shoulder internal rotation 90 90 90 90  Shoulder external rotation 62 70 88 85  (Blank rows = not tested)  UPPER EXTREMITY MMT:     Not tested due to precautions.   MMT Left eval Left 10/08/22 Left 10/22/22 Left 11/09/22  Shoulder flexion  3/5 3+/5 3+/5  Shoulder abduction  3/5 3/5 3/5  Shoulder internal rotation  4/5 4-/5 4/5  Shoulder external rotation  4-/5 4/5 4-/5  (Blank rows = not tested)  SENSATION: Pt reports dullness in anterior shoulder region  OBSERVATIONS: mod fascial restrictions along  upper arm, anterior shoulder, and trapezius regions   TODAY'S TREATMENT:                                                                                                                              DATE: 11/16/22 - P/ROM: shoulder flexion, abduction, external rotation, 5 reps -Shoulder strengthening: supine, 1#: protraction, flexion, abduction, horizontal abduction, er, 15 reps  -ABC writing: 1#-arm at 90 degrees flexion, 10 rest breaks -A/ROM: standing: protraction, flexion, abduction, horizontal abduction, er, 10 reps  -Scapular strengthening: green theraband-row, extension, retraction, 15 reps -Functional reaching: 1# wrist weight-pt standing at cabinets placing 10 cones on top shelf in flexion, removing in abduction -Overhead lacing: 1# wrist weight-seated-lacing from top down then reversing  11/12/22 - P/ROM: shoulder flexion, abduction, external rotation 10x  - A/ROM supine: shoulder flexion, abduction, external rotation 10x  - A/ROM seated: shoulder flexion, abduction, external rotation 10x -Shoulder strengthening: 1#-protraction,  er; A/ROM- flexion, abduction, horizontal abduction, 12 reps -Scapular strengthening: green theraband-row, extension, retraction, 15 reps -UBE: level 2, 2' reverse, pace: 14.0   11/09/22 -Sidelying strengthening: 1#-protraction, flexion, abduction, er, horizontal abduction, 12 reps -ABC writing: 1#-arm at 90 degrees flexion, 6 rest breaks -Shoulder strengthening: 1#-protraction, er; A/ROM- flexion, abduction, horizontal abduction, 12 reps -Ball on wall: 1' abduction -Overhead lacing: seated-lacing from top down then reversing -Scapular strengthening: green theraband-row, extension, retraction, 15 reps -UBE: level 2, 2' reverse, pace: 10.0    PATIENT EDUCATION: Education details: continue with shoulder strengthening Person educated: Patient Education method: Explanation, Demonstration, and Handouts Education comprehension: verbalized understanding and returned demonstration  HOME EXERCISE PROGRAM: Eval: table slides 5/24: AA/ROM 5/31: A/ROM  6/18: Scapular Strengthening 6/28: Shoulder strengthening  GOALS: Goals reviewed with patient? Yes   SHORT TERM GOALS: Target date: 10/08/22  Pt will be provided with and educated on HEP to improve mobility in LUE required for use during ADL completion.   Goal status: IN PROGRESS  2.  Pt will increase LUE P/ROM by 30 degrees or greater to improve ability to use LUE during dressing tasks with minimal compensatory techniques.   Goal status: MET  3.  Pt will increase LUE strength to 3+/5 to improve ability to reach for items at waist to chest height during bathing and grooming tasks.   Goal status: IN PROGRESS    LONG TERM GOALS: Target date: 10/29/22  Pt will decrease pain in LUE to 4/10 or less to improve ability to sleep for 2+ consecutive hours without waking due to pain.   Goal status: IN PROGRESS  2.  Pt will decrease LUE fascial restrictions to min amounts or less to improve mobility required for functional reaching tasks such  as threading belt or reaching head.  Goal status: IN PROGRESS  3.  Pt will increase LUE A/ROM to at least 140 degrees flexion and abduction, 60 er, to improve ability to use LUE when reaching overhead or behind back during dressing and bathing tasks.  Goal status: IN PROGRESS  4.  Pt will increase LUE strength to 4/5 or greater to improve ability to use LUE when lifting or carrying items during meal preparation/housework/yardwork tasks.   Goal status: IN PROGRESS  5.  Pt will return to highest level of function using LUE as non-dominant during functional task completion.   Goal status: IN PROGRESS   ASSESSMENT:  CLINICAL IMPRESSION: Pt reports he has taken it easy for a few days and has noticed less pain and soreness in the LUE. Pt with full ROM during passive stretching and A/ROM tasks. Completing shoulder strengthening in supine and standing, cuing to focus on form and technique versus using momentum to lift the arm. Resumed ABC writing, pt requiring multiple rest breaks and max cuing to straighten elbow into full extension. Attempted standing strengthening, however pt with poor form therefore downgraded to A/ROM, abduction the most difficulty to maintain form. Pt requiring max cuing to depress trapezius during scapular theraband exercises. Discussed HEP completion and focus on form.   PERFORMANCE DEFICITS: in functional skills including in functional skills including ADLs, IADLs, coordination, tone, ROM, strength, pain, fascial restrictions, muscle spasms, and UE functional use   PLAN:  OT FREQUENCY: 2x/week  OT DURATION: 6 weeks  PLANNED INTERVENTIONS: self care/ADL training, therapeutic exercise, therapeutic activity, neuromuscular re-education, manual therapy, passive range of motion, splinting, electrical stimulation, ultrasound, moist heat, cryotherapy, patient/family education, and DME and/or AE instructions  CONSULTED AND AGREED WITH PLAN OF CARE: Patient  PLAN FOR NEXT  SESSION: continue shoulder strengthening and stability work, follow up on HEP completion, attempt therapy ball strengthening      Ezra Sites, OTR/L  [phone] 11/16/2022, 11:53 AM

## 2022-11-18 DIAGNOSIS — E785 Hyperlipidemia, unspecified: Secondary | ICD-10-CM | POA: Diagnosis not present

## 2022-11-18 DIAGNOSIS — E1169 Type 2 diabetes mellitus with other specified complication: Secondary | ICD-10-CM | POA: Diagnosis not present

## 2022-11-18 DIAGNOSIS — I10 Essential (primary) hypertension: Secondary | ICD-10-CM | POA: Diagnosis not present

## 2022-11-18 DIAGNOSIS — Z79899 Other long term (current) drug therapy: Secondary | ICD-10-CM | POA: Diagnosis not present

## 2022-11-18 DIAGNOSIS — E1159 Type 2 diabetes mellitus with other circulatory complications: Secondary | ICD-10-CM | POA: Diagnosis not present

## 2022-11-18 DIAGNOSIS — Z125 Encounter for screening for malignant neoplasm of prostate: Secondary | ICD-10-CM | POA: Diagnosis not present

## 2022-11-19 ENCOUNTER — Encounter (HOSPITAL_COMMUNITY): Payer: Self-pay | Admitting: Occupational Therapy

## 2022-11-19 ENCOUNTER — Ambulatory Visit (HOSPITAL_COMMUNITY): Payer: 59 | Admitting: Occupational Therapy

## 2022-11-19 DIAGNOSIS — M25612 Stiffness of left shoulder, not elsewhere classified: Secondary | ICD-10-CM | POA: Diagnosis not present

## 2022-11-19 DIAGNOSIS — R29898 Other symptoms and signs involving the musculoskeletal system: Secondary | ICD-10-CM

## 2022-11-19 DIAGNOSIS — M25512 Pain in left shoulder: Secondary | ICD-10-CM

## 2022-11-19 LAB — BASIC METABOLIC PANEL
BUN/Creatinine Ratio: 14 (ref 10–24)
BUN: 14 mg/dL (ref 8–27)
CO2: 22 mmol/L (ref 20–29)
Calcium: 9.2 mg/dL (ref 8.6–10.2)
Chloride: 107 mmol/L — ABNORMAL HIGH (ref 96–106)
Creatinine, Ser: 0.97 mg/dL (ref 0.76–1.27)
Glucose: 184 mg/dL — ABNORMAL HIGH (ref 70–99)
Potassium: 4.5 mmol/L (ref 3.5–5.2)
Sodium: 142 mmol/L (ref 134–144)
eGFR: 87 mL/min/{1.73_m2} (ref >59)

## 2022-11-19 LAB — MICROALBUMIN / CREATININE URINE RATIO
Creatinine, Urine: 142.9 mg/dL
Microalb/Creat Ratio: 19 mg/g creat (ref 0–29)
Microalbumin, Urine: 27.6 ug/mL

## 2022-11-19 LAB — HEPATIC FUNCTION PANEL
ALT: 6 IU/L (ref 0–44)
AST: 18 IU/L (ref 0–40)
Albumin: 4.4 g/dL (ref 3.9–4.9)
Alkaline Phosphatase: 88 IU/L (ref 44–121)
Bilirubin Total: 0.4 mg/dL (ref 0.0–1.2)
Bilirubin, Direct: 0.14 mg/dL (ref 0.00–0.40)
Total Protein: 6.8 g/dL (ref 6.0–8.5)

## 2022-11-19 LAB — LIPID PANEL
Chol/HDL Ratio: 3.1 ratio (ref 0.0–5.0)
Cholesterol, Total: 101 mg/dL (ref 100–199)
HDL: 33 mg/dL — ABNORMAL LOW (ref >39)
LDL Chol Calc (NIH): 46 mg/dL (ref 0–99)
Triglycerides: 119 mg/dL (ref 0–149)
VLDL Cholesterol Cal: 22 mg/dL (ref 5–40)

## 2022-11-19 LAB — HEMOGLOBIN A1C
Est. average glucose Bld gHb Est-mCnc: 174 mg/dL
Hgb A1c MFr Bld: 7.7 % — ABNORMAL HIGH (ref 4.8–5.6)

## 2022-11-19 LAB — PSA: Prostate Specific Ag, Serum: 1.3 ng/mL (ref 0.0–4.0)

## 2022-11-19 NOTE — Patient Instructions (Signed)

## 2022-11-19 NOTE — Therapy (Signed)
OUTPATIENT OCCUPATIONAL THERAPY ORTHO TREATMENT   Patient Name: Travis [name] MRN: [id number] DOB:January 07, 1961, 62 y.o., male Today's Date: 11/19/2022   END OF SESSION:  OT End of Session - 11/19/22 1615     Visit Number 13    Number of Visits 15    Date for OT Re-Evaluation 11/26/22    Authorization Type [employer]-$40 copay    Progress Note Due on Visit 18    OT Start Time 1123    OT Stop Time 1204    OT Time Calculation (min) 41 min    Activity Tolerance Patient tolerated treatment well    Behavior During Therapy WFL for tasks assessed/performed             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   Anxiety    Arthritis    Blood transfusion without reported diagnosis    Complication of anesthesia    bleed after last shoulder-had to stay overnight   Depression    Diabetes mellitus    Diabetic peripheral neuropathy (HCC) 03/28/2013   Diverticulosis    Dizziness    Hernia, inguinal    Hyperlipidemia    Hypertension    Impingement syndrome of left shoulder 08/02/2014   Ischemic colitis (HCC)    Multi-infarct state 10/14/2014   Neuropathy of lower extremity    Night sweats    every once in a while   Sleep apnea    no CPAP   Stroke (HCC) 02/2015   Syncope and collapse    Past Surgical History:  Procedure Laterality Date   BIOPSY  06/14/2019   Procedure: BIOPSY;  Surgeon: Sherrilyn Rist, MD;  Location: [facility];  Service: Gastroenterology;;   CERVICAL DISC SURGERY     COLON SURGERY  2003   1/3 removed for diverticulitis   COLONOSCOPY     COLONOSCOPY WITH PROPOFOL N/A 06/14/2019   Procedure: COLONOSCOPY WITH PROPOFOL;  Surgeon: Sherrilyn Rist, MD;  Location: [facility];  Service: Gastroenterology;  Laterality: N/A;   EP IMPLANTABLE DEVICE N/A 05/01/2015   Procedure: Loop Recorder Insertion;  Surgeon: Duke Salvia, MD;  Location: [facility] INVASIVE  CV LAB;  Service: Cardiovascular;  Laterality: N/A;   INGUINAL HERNIA REPAIR Bilateral    KNEE ARTHROSCOPY Right    SHOULDER ARTHROSCOPY Right    1   SHOULDER ARTHROSCOPY Left 08/02/2014   Procedure: LEFT SHOULDER SCOPE DEBRIDEMENT/ACROMIOPLASTY;  Surgeon: Teryl Lucy, MD;  Location: [facility];  Service: Orthopedics;  Laterality: Left;  ANESTHESIA: GENERAL, PRE/POST OP SCALENE   TEE WITHOUT CARDIOVERSION N/A 03/03/2015   Procedure: TRANSESOPHAGEAL ECHOCARDIOGRAM (TEE);  Surgeon: Laqueta Linden, MD;  Location: [facility];  Service: Cardiology;  Laterality: N/A;   UMBILICAL HERNIA REPAIR     with other hernia repair with mesh   WRIST SURGERY Right    fusion   Patient Active Problem List   Diagnosis Date Noted   Hypercoagulable state due to paroxysmal atrial fibrillation (HCC) 07/19/2022   Nontraumatic incomplete tear of left rotator cuff 07/07/2022   Arthritis of left acromioclavicular joint 07/07/2022   Right groin hernia 03/03/2021   Hyperlipidemia associated with type 2 diabetes mellitus (HCC) 12/25/2020   Tendinopathy of right rotator cuff 09/25/2020   Arthrosis of right acromioclavicular joint 09/25/2020   Adrenal adenoma, left 09/07/2019   Chronic anticoagulation    GI bleed 06/14/2019  Vitamin D deficiency 04/18/2019   History of stroke 04/20/2018   GAD (generalized anxiety disorder) 05/15/2016   Acute bronchiolitis due to respiratory syncytial virus (RSV)    Acute CVA (cerebrovascular accident) (HCC) 05/04/2016   Dizziness 05/02/2016   Anxiety and depression 05/02/2016   Paroxysmal atrial fibrillation (HCC) 03/02/2016   PFO (patent foramen ovale) 07/24/2015   OSA (obstructive sleep apnea) 04/18/2015   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 artery (HCC) 04/18/2015   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   Snoring 01/23/2015   Degeneration of cervical intervertebral disc 01/23/2015   Essential hypertension 12/14/2014   DM type 2  causing vascular disease (HCC) 12/14/2014   Neck pain 12/14/2014   Cerebral infarction, chronic    Multi-infarct state 10/14/2014   Impingement syndrome of left shoulder 08/02/2014   Adhesive capsulitis of left shoulder 08/02/2014   Rectal bleeding 03/13/2014   Diabetes type 2, controlled (HCC) 02/18/2014   Diabetic peripheral neuropathy (HCC) 03/28/2013   Irreducible incisional hernia 11/11/2010    PCP: Dr. Lilyan Punt REFERRING PROVIDER: Jari Sportsman, PA-C  ONSET DATE: 09/01/22  REFERRING DIAG: S/P arthroscopy of left shoulder   THERAPY DIAG:  Acute pain of left shoulder  Stiffness of left shoulder, not elsewhere classified  Other symptoms and signs involving the musculoskeletal system  Rationale for Evaluation and Treatment: Rehabilitation  SUBJECTIVE:   SUBJECTIVE STATEMENT: S: "I have been really sore recently, trying to take it easy"   PERTINENT HISTORY: Pt is a 62 y/o male s/p S/P arthroscopy of left shoulder on 09/01/22. Pt presents without sling, reports he is no longer wearing.   PRECAUTIONS: Shoulder-AA/ROM progress as tolerated.   WEIGHT BEARING RESTRICTIONS: Yes NWB  PAIN:  Are you having pain? Yes: NPRS scale: 5/10 Pain location: both shoulder Pain description: aching Aggravating factors: movement, reaching up Relieving factors: rest  FALLS: Has patient fallen in last 6 months? No  PATIENT GOALS: To be able to use the LUE during ADLs.   NEXT MD VISIT: 11/23/22  OBJECTIVE:   HAND DOMINANCE: Right  ADLs: Overall ADLs: Pt is having difficulty with reaching up overhead, behind back. Pt is unable to sleep on the left side, wakes up at night due to pain. Pt is having difficulty with dressing, bathing and reaching head and back. Pt is unable to operate belt buckles.   FUNCTIONAL OUTCOME MEASURES: FOTO: 49/100 10/08/22: 53/100 10/22/22: 59.60/100  UPPER EXTREMITY ROM:       Assessed in supine, er/IR adducted  Passive ROM Left eval Left 10/08/22   Shoulder flexion 129 146  Shoulder abduction 113 155  Shoulder internal rotation 90 90  Shoulder external rotation 64 73  (Blank rows = not tested)    Assessed in seated, er/IR adducted    Active ROM Left eval Left 10/08/22 Left 10/22/22 Left 11/09/22  Shoulder flexion 81 109 116 132  Shoulder abduction 48 102 124 144  Shoulder internal rotation 90 90 90 90  Shoulder external rotation 62 70 88 85  (Blank rows = not tested)  UPPER EXTREMITY MMT:     Not tested due to precautions.   MMT Left eval Left 10/08/22 Left 10/22/22 Left 11/09/22  Shoulder flexion  3/5 3+/5 3+/5  Shoulder abduction  3/5 3/5 3/5  Shoulder internal rotation  4/5 4-/5 4/5  Shoulder external rotation  4-/5 4/5 4-/5  (Blank rows = not tested)  SENSATION: Pt reports dullness in anterior shoulder region  OBSERVATIONS: mod fascial restrictions along upper arm, anterior shoulder, and trapezius  regions   TODAY'S TREATMENT:                                                                                                                              DATE:  11/19/22 -A/ROM: seated, flexion, abduction, protraction, horizontal abduction, er/IR, 12 -PNF Strengthening: green bands, Chest Pulls, head height pulls, er pulls, PNF up, PNF down, x10 -Loop Band Exercises: Wall Taps, Wall V ups, Wall Clocks, x12 -Proximal Shoulder Exercises: paddles, criss cross, circles both directions, x10 -Functional Reaching: counter to shoulder height taps, overhead shelf taps, x10  11/16/22 - P/ROM: shoulder flexion, abduction, external rotation, 5 reps -Shoulder strengthening: supine, 1#: protraction, flexion, abduction, horizontal abduction, er, 15 reps  -ABC writing: 1#-arm at 90 degrees flexion, 10 rest breaks -A/ROM: standing: protraction, flexion, abduction, horizontal abduction, er, 10 reps  -Scapular strengthening: green theraband-row, extension, retraction, 15 reps -Functional reaching: 1# wrist weight-pt standing at cabinets  placing 10 cones on top shelf in flexion, removing in abduction -Overhead lacing: 1# wrist weight-seated-lacing from top down then reversing  11/12/22 - P/ROM: shoulder flexion, abduction, external rotation 10x  - A/ROM supine: shoulder flexion, abduction, external rotation 10x  - A/ROM seated: shoulder flexion, abduction, external rotation 10x -Shoulder strengthening: 1#-protraction, er; A/ROM- flexion, abduction, horizontal abduction, 12 reps -Scapular strengthening: green theraband-row, extension, retraction, 15 reps -UBE: level 2, 2' reverse, pace: 14.0    PATIENT EDUCATION: Education details: PNF Strengthening Person educated: Patient Education method: Explanation, Demonstration, and Handouts Education comprehension: verbalized understanding and returned demonstration  HOME EXERCISE PROGRAM: Eval: table slides 5/24: AA/ROM 5/31: A/ROM  6/18: Scapular Strengthening 6/28: Shoulder strengthening 7/19: PNF Strengthening   GOALS: Goals reviewed with patient? Yes   SHORT TERM GOALS: Target date: 10/08/22  Pt will be provided with and educated on HEP to improve mobility in LUE required for use during ADL completion.   Goal status: IN PROGRESS  2.  Pt will increase LUE P/ROM by 30 degrees or greater to improve ability to use LUE during dressing tasks with minimal compensatory techniques.   Goal status: MET  3.  Pt will increase LUE strength to 3+/5 to improve ability to reach for items at waist to chest height during bathing and grooming tasks.   Goal status: IN PROGRESS    LONG TERM GOALS: Target date: 10/29/22  Pt will decrease pain in LUE to 4/10 or less to improve ability to sleep for 2+ consecutive hours without waking due to pain.   Goal status: IN PROGRESS  2.  Pt will decrease LUE fascial restrictions to min amounts or less to improve mobility required for functional reaching tasks such as threading belt or reaching head.  Goal status: IN PROGRESS  3.  Pt will  increase LUE A/ROM to at least 140 degrees flexion and abduction, 60 er, to improve ability to use LUE when reaching overhead or behind back during dressing and bathing tasks.   Goal status: IN PROGRESS  4.  Pt will increase LUE strength to 4/5 or greater to improve ability to use LUE when lifting or carrying items during meal preparation/housework/yardwork tasks.   Goal status: IN PROGRESS  5.  Pt will return to highest level of function using LUE as non-dominant during functional task completion.   Goal status: IN PROGRESS   ASSESSMENT:  CLINICAL IMPRESSION: Pt continuing to report increased pain and weakness with use of LUE. He states that it goes from his middle finger through his forearm and elbow, and up to his shoulder. Due to pain, pt requiring increased time for completion of tasks this session. OT added PNF strengthening and loop band scapular exercises this session to improve his strength and tolerance. Verbal and visual cuing provided throughout session for positioning and technique.   PERFORMANCE DEFICITS: in functional skills including in functional skills including ADLs, IADLs, coordination, tone, ROM, strength, pain, fascial restrictions, muscle spasms, and UE functional use   PLAN:  OT FREQUENCY: 2x/week  OT DURATION: 6 weeks  PLANNED INTERVENTIONS: self care/ADL training, therapeutic exercise, therapeutic activity, neuromuscular re-education, manual therapy, passive range of motion, splinting, electrical stimulation, ultrasound, moist heat, cryotherapy, patient/family education, and DME and/or AE instructions  CONSULTED AND AGREED WITH PLAN OF CARE: Patient  PLAN FOR NEXT SESSION: continue shoulder strengthening and stability work, follow up on HEP completion, attempt therapy ball strengthening      Trish Mage, OTR/L [phone] 11/19/2022, 4:16 PM

## 2022-11-22 ENCOUNTER — Other Ambulatory Visit: Payer: Self-pay | Admitting: Family Medicine

## 2022-11-22 ENCOUNTER — Other Ambulatory Visit: Payer: Self-pay

## 2022-11-22 ENCOUNTER — Other Ambulatory Visit (HOSPITAL_COMMUNITY): Payer: Self-pay

## 2022-11-22 ENCOUNTER — Other Ambulatory Visit: Payer: Self-pay | Admitting: Nurse Practitioner

## 2022-11-22 MED ORDER — TRESIBA FLEXTOUCH 100 UNIT/ML ~~LOC~~ SOPN
30.0000 [IU] | PEN_INJECTOR | Freq: Every day | SUBCUTANEOUS | 0 refills | Status: DC
  Filled 2022-11-22: qty 27, 90d supply, fill #0

## 2022-11-22 MED ORDER — FOLIC ACID 1 MG PO TABS
1.0000 mg | ORAL_TABLET | Freq: Every day | ORAL | 2 refills | Status: AC
  Filled 2022-11-22: qty 30, 30d supply, fill #0
  Filled 2023-01-01: qty 30, 30d supply, fill #1
  Filled 2023-02-05: qty 30, 30d supply, fill #2

## 2022-11-23 ENCOUNTER — Encounter: Payer: Self-pay | Admitting: Physician Assistant

## 2022-11-23 ENCOUNTER — Ambulatory Visit (INDEPENDENT_AMBULATORY_CARE_PROVIDER_SITE_OTHER): Payer: 59 | Admitting: Physician Assistant

## 2022-11-23 ENCOUNTER — Ambulatory Visit (HOSPITAL_COMMUNITY): Payer: 59 | Admitting: Occupational Therapy

## 2022-11-23 ENCOUNTER — Other Ambulatory Visit: Payer: Self-pay

## 2022-11-23 ENCOUNTER — Encounter (HOSPITAL_COMMUNITY): Payer: Self-pay | Admitting: Occupational Therapy

## 2022-11-23 ENCOUNTER — Other Ambulatory Visit (HOSPITAL_COMMUNITY): Payer: Self-pay

## 2022-11-23 DIAGNOSIS — M25612 Stiffness of left shoulder, not elsewhere classified: Secondary | ICD-10-CM

## 2022-11-23 DIAGNOSIS — M65332 Trigger finger, left middle finger: Secondary | ICD-10-CM | POA: Diagnosis not present

## 2022-11-23 DIAGNOSIS — M25512 Pain in left shoulder: Secondary | ICD-10-CM | POA: Diagnosis not present

## 2022-11-23 DIAGNOSIS — R29898 Other symptoms and signs involving the musculoskeletal system: Secondary | ICD-10-CM | POA: Diagnosis not present

## 2022-11-23 DIAGNOSIS — Z9889 Other specified postprocedural states: Secondary | ICD-10-CM

## 2022-11-23 NOTE — Progress Notes (Signed)
Office Visit Note   Patient: Travis [name]           Date of Birth: 09-08-1960           MRN: [id number] Visit Date: 11/23/2022              Requested by: Babs Sciara, MD [address] [place],  [place] [address] PCP: Babs Sciara, MD   Assessment & Plan: Visit Diagnoses:  1. S/P arthroscopy of left shoulder   2. Trigger finger, left middle finger     Plan: Impression is 12 weeks status post left shoulder scope and left long trigger finger.  In regards to the shoulder, I would like for him to continue working on strengthening exercises and physical therapy.  In regards to the trigger finger, we have discussed various treatment options to include cortisone injection versus A1 pulley release.  He is not interested in injections.  He would like to proceed with surgery.  Risk, benefits possible occasions reviewed.  Rehab recovery time discussed.  All questions were answered.  His most recent hemoglobin A1c was 7.7 on 11/18/2022.  He is on Eliquis for A-fib and sees Dr. Gerda Diss.  Will need him to come off of Eliquis 3 days prior to surgery.  He was cleared by Dr. Gerda Diss prior to his left shoulder surgery on 09/01/2022.  I will fill out a surgery sheet and place it on Dr. Donaciano Eva desk and have Debbie call the patient to schedule surgery.  Follow-Up Instructions: Return for post-op.   Orders:  No orders of the defined types were placed in this encounter.  No orders of the defined types were placed in this encounter.     Procedures: No procedures performed   Clinical Data: No additional findings.   Subjective: Chief Complaint  Patient presents with   Left Shoulder - Follow-up    Left shoulder scope 09/01/2022   Left Hand - Follow-up    Middle finger    HPI patient is a pleasant 62 year old gentleman who comes in today nearly 12 weeks status post left shoulder arthroscopic debridement decompression 09/01/2022.  He is still in a fair amount of pain.  He is not wanting to  take anything for this.  He is in physical therapy making slow but somewhat steady progress.  Other issue he brings up today is left long finger triggering.  He has had this for several months.  No previous cortisone injection.  He is diabetic.  Review of Systems as detailed in HPI.  All others reviewed and are negative.   Objective: Vital Signs: There were no vitals taken for this visit.  Physical Exam well-developed well-nourished gentleman in no acute distress.  Alert and oriented x 3.  Ortho Exam left shoulder exam reveals active forward flexion to approximately 120 degrees.  I can passively get him to about 160.  Good internal and external rotation.  He is neurovascularly intact distally.  Left hand exam: Moderate tenderness and palpable nodule at the A1 pulley of the long finger.  He does have reproducible triggering.  He is neurovascularly intact distally.  Specialty Comments:  No specialty comments available.  Imaging: No new imaging   PMFS History: Patient Active Problem List   Diagnosis Date Noted   Hypercoagulable state due to paroxysmal atrial fibrillation (HCC) 07/19/2022   Nontraumatic incomplete tear of left rotator cuff 07/07/2022   Arthritis of left acromioclavicular joint 07/07/2022   Right groin hernia 03/03/2021   Hyperlipidemia associated  with type 2 diabetes mellitus (HCC) 12/25/2020   Tendinopathy of right rotator cuff 09/25/2020   Arthrosis of right acromioclavicular joint 09/25/2020   Adrenal adenoma, left 09/07/2019   Chronic anticoagulation    GI bleed 06/14/2019   Vitamin D deficiency 04/18/2019   History of stroke 04/20/2018   GAD (generalized anxiety disorder) 05/15/2016   Acute bronchiolitis due to respiratory syncytial virus (RSV)    Acute CVA (cerebrovascular accident) (HCC) 05/04/2016   Dizziness 05/02/2016   Anxiety and depression 05/02/2016   Paroxysmal atrial fibrillation (HCC) 03/02/2016   PFO (patent foramen ovale) 07/24/2015   OSA  (obstructive sleep apnea) 04/18/2015   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 artery (HCC) 04/18/2015   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   Snoring 01/23/2015   Degeneration of cervical intervertebral disc 01/23/2015   Essential hypertension 12/14/2014   DM type 2 causing vascular disease (HCC) 12/14/2014   Neck pain 12/14/2014   Cerebral infarction, chronic    Multi-infarct state 10/14/2014   Impingement syndrome of left shoulder 08/02/2014   Adhesive capsulitis of left shoulder 08/02/2014   Rectal bleeding 03/13/2014   Diabetes type 2, controlled (HCC) 02/18/2014   Diabetic peripheral neuropathy (HCC) 03/28/2013   Irreducible incisional hernia 11/11/2010   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   Anxiety    Arthritis    Blood transfusion without reported diagnosis    Complication of anesthesia    bleed after last shoulder-had to stay overnight   Depression    Diabetes mellitus    Diabetic peripheral neuropathy (HCC) 03/28/2013   Diverticulosis    Dizziness    Hernia, inguinal    Hyperlipidemia    Hypertension    Impingement syndrome of left shoulder 08/02/2014   Ischemic colitis (HCC)    Multi-infarct state 10/14/2014   Neuropathy of lower extremity    Night sweats    every once in a while   Sleep apnea    no CPAP   Stroke (HCC) 02/2015   Syncope and collapse     Family History  Problem Relation Age of Onset   Stroke Father    Hyperlipidemia Father    Heart attack Sister 33   Stroke Sister    Dementia Mother    ALS Brother        age 40   Diabetes Maternal Grandfather    Colon cancer Neg Hx    Esophageal cancer Neg Hx    Stomach cancer Neg Hx    Rectal cancer Neg Hx     Past Surgical History:  Procedure Laterality Date   BIOPSY  06/14/2019   Procedure: BIOPSY;  Surgeon: Sherrilyn Rist, MD;  Location: [facility];  Service:  Gastroenterology;;   CERVICAL DISC SURGERY     COLON SURGERY  2003   1/3 removed for diverticulitis   COLONOSCOPY     COLONOSCOPY WITH PROPOFOL N/A 06/14/2019   Procedure: COLONOSCOPY WITH PROPOFOL;  Surgeon: Sherrilyn Rist, MD;  Location: [facility];  Service: Gastroenterology;  Laterality: N/A;   EP IMPLANTABLE DEVICE N/A 05/01/2015   Procedure: Loop Recorder Insertion;  Surgeon: Duke Salvia, MD;  Location: [facility] INVASIVE CV LAB;  Service: Cardiovascular;  Laterality: N/A;   INGUINAL HERNIA REPAIR Bilateral    KNEE ARTHROSCOPY Right    SHOULDER ARTHROSCOPY Right    1   SHOULDER ARTHROSCOPY Left 08/02/2014   Procedure:  LEFT SHOULDER SCOPE DEBRIDEMENT/ACROMIOPLASTY;  Surgeon: Teryl Lucy, MD;  Location: [facility];  Service: Orthopedics;  Laterality: Left;  ANESTHESIA: GENERAL, PRE/POST OP SCALENE   TEE WITHOUT CARDIOVERSION N/A 03/03/2015   Procedure: TRANSESOPHAGEAL ECHOCARDIOGRAM (TEE);  Surgeon: Laqueta Linden, MD;  Location: [facility];  Service: Cardiology;  Laterality: N/A;   UMBILICAL HERNIA REPAIR     with other hernia repair with mesh   WRIST SURGERY Right    fusion   Social History   Occupational History   Occupation: disabled  Tobacco Use   Smoking status: Former    Current packs/day: 0.00    Average packs/day: 1 pack/day for 8.0 years (8.0 ttl pk-yrs)    Types: Cigarettes    Start date: 06/07/1970    Quit date: 05/03/1978    Years since quitting: 44.5   Smokeless tobacco: Former    Types: Chew    Quit date: 05/03/1978   Tobacco comments:    Former smoker 07/19/22  Vaping Use   Vaping status: Never Used  Substance and Sexual Activity   Alcohol use: Yes    Alcohol/week: 1.0 standard drink of alcohol    Types: 1 Cans of beer per week    Comment: 1 beer every 6 months h/o heavy use in the past 07/19/22   Drug use: No   Sexual activity: Yes

## 2022-11-23 NOTE — Therapy (Signed)
OUTPATIENT OCCUPATIONAL THERAPY ORTHO TREATMENT   Patient Name: Travis [name] MRN: [id number] DOB:Nov 07, 1960, 62 y.o., male Today's Date: 11/23/2022   END OF SESSION:  OT End of Session - 11/23/22 1248     Visit Number 14    Number of Visits 15    Date for OT Re-Evaluation 11/26/22    Authorization Type [employer]-$40 copay    Progress Note Due on Visit 18    OT Start Time 1120    OT Stop Time 1158    OT Time Calculation (min) 38 min    Activity Tolerance Patient tolerated treatment well    Behavior During Therapy WFL for tasks assessed/performed              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   Anxiety    Arthritis    Blood transfusion without reported diagnosis    Complication of anesthesia    bleed after last shoulder-had to stay overnight   Depression    Diabetes mellitus    Diabetic peripheral neuropathy (HCC) 03/28/2013   Diverticulosis    Dizziness    Hernia, inguinal    Hyperlipidemia    Hypertension    Impingement syndrome of left shoulder 08/02/2014   Ischemic colitis (HCC)    Multi-infarct state 10/14/2014   Neuropathy of lower extremity    Night sweats    every once in a while   Sleep apnea    no CPAP   Stroke (HCC) 02/2015   Syncope and collapse    Past Surgical History:  Procedure Laterality Date   BIOPSY  06/14/2019   Procedure: BIOPSY;  Surgeon: Sherrilyn Rist, MD;  Location: [facility];  Service: Gastroenterology;;   CERVICAL DISC SURGERY     COLON SURGERY  2003   1/3 removed for diverticulitis   COLONOSCOPY     COLONOSCOPY WITH PROPOFOL N/A 06/14/2019   Procedure: COLONOSCOPY WITH PROPOFOL;  Surgeon: Sherrilyn Rist, MD;  Location: [facility];  Service: Gastroenterology;  Laterality: N/A;   EP IMPLANTABLE DEVICE N/A 05/01/2015   Procedure: Loop Recorder Insertion;  Surgeon: Duke Salvia, MD;  Location: [facility]  INVASIVE CV LAB;  Service: Cardiovascular;  Laterality: N/A;   INGUINAL HERNIA REPAIR Bilateral    KNEE ARTHROSCOPY Right    SHOULDER ARTHROSCOPY Right    1   SHOULDER ARTHROSCOPY Left 08/02/2014   Procedure: LEFT SHOULDER SCOPE DEBRIDEMENT/ACROMIOPLASTY;  Surgeon: Teryl Lucy, MD;  Location: [facility];  Service: Orthopedics;  Laterality: Left;  ANESTHESIA: GENERAL, PRE/POST OP SCALENE   TEE WITHOUT CARDIOVERSION N/A 03/03/2015   Procedure: TRANSESOPHAGEAL ECHOCARDIOGRAM (TEE);  Surgeon: Laqueta Linden, MD;  Location: [facility];  Service: Cardiology;  Laterality: N/A;   UMBILICAL HERNIA REPAIR     with other hernia repair with mesh   WRIST SURGERY Right    fusion   Patient Active Problem List   Diagnosis Date Noted   Hypercoagulable state due to paroxysmal atrial fibrillation (HCC) 07/19/2022   Nontraumatic incomplete tear of left rotator cuff 07/07/2022   Arthritis of left acromioclavicular joint 07/07/2022   Right groin hernia 03/03/2021   Hyperlipidemia associated with type 2 diabetes mellitus (HCC) 12/25/2020   Tendinopathy of right rotator cuff 09/25/2020   Arthrosis of right acromioclavicular joint 09/25/2020   Adrenal adenoma, left 09/07/2019   Chronic anticoagulation    GI bleed 06/14/2019  Vitamin D deficiency 04/18/2019   History of stroke 04/20/2018   GAD (generalized anxiety disorder) 05/15/2016   Acute bronchiolitis due to respiratory syncytial virus (RSV)    Acute CVA (cerebrovascular accident) (HCC) 05/04/2016   Dizziness 05/02/2016   Anxiety and depression 05/02/2016   Paroxysmal atrial fibrillation (HCC) 03/02/2016   PFO (patent foramen ovale) 07/24/2015   OSA (obstructive sleep apnea) 04/18/2015   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 artery (HCC) 04/18/2015   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 Lilyan Punt REFERRING PROVIDER: Jari Sportsman, PA-C  ONSET DATE: 09/01/22  REFERRING DIAG: S/P arthroscopy of left shoulder   THERAPY DIAG:  Acute pain of left shoulder  Stiffness of left shoulder, not elsewhere classified  Other symptoms and signs involving the musculoskeletal system  Rationale for Evaluation and Treatment: Rehabilitation  SUBJECTIVE:   SUBJECTIVE STATEMENT: S: "I'm just sore."   PERTINENT HISTORY: Pt is a 62 y/o male s/p S/P arthroscopy of left shoulder on 09/01/22. Pt presents without sling, reports he is no longer wearing.   PRECAUTIONS: Shoulder-AA/ROM progress as tolerated.   WEIGHT BEARING RESTRICTIONS: Yes NWB  PAIN:  Are you having pain? Yes: NPRS scale: 5/10 Pain location: both shoulder Pain description: aching Aggravating factors: movement, reaching up Relieving factors: rest  FALLS: Has patient fallen in last 6 months? No  PATIENT GOALS: To be able to use the LUE during ADLs.   NEXT MD VISIT: 11/23/22  OBJECTIVE:   HAND DOMINANCE: Right  ADLs: Overall ADLs: Pt is having difficulty with reaching up overhead, behind back. Pt is unable to sleep on the left side, wakes up at night due to pain. Pt is having difficulty with dressing, bathing and reaching head and back. Pt is unable to operate belt buckles.   FUNCTIONAL OUTCOME MEASURES: FOTO: 49/100 10/08/22: 53/100 10/22/22: 59.60/100  UPPER EXTREMITY ROM:       Assessed in supine, er/IR adducted  Passive ROM Left eval Left 10/08/22  Shoulder flexion 129 146  Shoulder  abduction 113 155  Shoulder internal rotation 90 90  Shoulder external rotation 64 73  (Blank rows = not tested)    Assessed in seated, er/IR adducted    Active ROM Left eval Left 10/08/22 Left 10/22/22 Left 11/09/22  Shoulder flexion 81 109 116 132  Shoulder abduction 48 102 124 144  Shoulder internal rotation 90 90 90 90  Shoulder external rotation 62 70 88 85  (Blank rows = not tested)  UPPER EXTREMITY MMT:     Not tested due to precautions.   MMT Left eval Left 10/08/22 Left 10/22/22 Left 11/09/22  Shoulder flexion  3/5 3+/5 3+/5  Shoulder abduction  3/5 3/5 3/5  Shoulder internal rotation  4/5 4-/5 4/5  Shoulder external rotation  4-/5 4/5 4-/5  (Blank rows = not tested)  SENSATION: Pt reports dullness in anterior shoulder region  OBSERVATIONS: mod fascial restrictions along upper arm, anterior shoulder, and trapezius regions   TODAY'S TREATMENT:  DATE:  11/23/22 -A/ROM: seated, flexion, abduction, protraction, horizontal abduction, er/IR, x15 -functional reaching: 2lb dumbbell, x5 shoulder height, head height, and overhead height, 3lb dumbbell, x5, shoulder height, head height, x3 overhead height -Scapular strengthening: green band, extension, retraction, rows,x 15 -Shoulder Strengthening: horizontal abduction, er, ir, flexion, abduction, x15 -rebounder: yellow weighted ball x10, blue weighted ball x6,   11/19/22 -A/ROM: seated, flexion, abduction, protraction, horizontal abduction, er/IR, 12 -PNF Strengthening: green bands, Chest Pulls, head height pulls, er pulls, PNF up, PNF down, x10 -Loop Band Exercises: Wall Taps, Wall V ups, Wall Clocks, x12 -Proximal Shoulder Exercises: paddles, criss cross, circles both directions, x10 -Functional Reaching: counter to shoulder height taps, overhead shelf taps, x10  11/16/22 - P/ROM: shoulder flexion,  abduction, external rotation, 5 reps -Shoulder strengthening: supine, 1#: protraction, flexion, abduction, horizontal abduction, er, 15 reps  -ABC writing: 1#-arm at 90 degrees flexion, 10 rest breaks -A/ROM: standing: protraction, flexion, abduction, horizontal abduction, er, 10 reps  -Scapular strengthening: green theraband-row, extension, retraction, 15 reps -Functional reaching: 1# wrist weight-pt standing at cabinets placing 10 cones on top shelf in flexion, removing in abduction -Overhead lacing: 1# wrist weight-seated-lacing from top down then reversing  11/12/22 - P/ROM: shoulder flexion, abduction, external rotation 10x  - A/ROM supine: shoulder flexion, abduction, external rotation 10x  - A/ROM seated: shoulder flexion, abduction, external rotation 10x -Shoulder strengthening: 1#-protraction, er; A/ROM- flexion, abduction, horizontal abduction, 12 reps -Scapular strengthening: green theraband-row, extension, retraction, 15 reps -UBE: level 2, 2' reverse, pace: 14.0    PATIENT EDUCATION: Education details: PNF Strengthening Person educated: Patient Education method: Explanation, Demonstration, and Handouts Education comprehension: verbalized understanding and returned demonstration  HOME EXERCISE PROGRAM: Eval: table slides 5/24: AA/ROM 5/31: A/ROM  6/18: Scapular Strengthening 6/28: Shoulder strengthening 7/19: PNF Strengthening   GOALS: Goals reviewed with patient? Yes   SHORT TERM GOALS: Target date: 10/08/22  Pt will be provided with and educated on HEP to improve mobility in LUE required for use during ADL completion.   Goal status: IN PROGRESS  2.  Pt will increase LUE P/ROM by 30 degrees or greater to improve ability to use LUE during dressing tasks with minimal compensatory techniques.   Goal status: MET  3.  Pt will increase LUE strength to 3+/5 to improve ability to reach for items at waist to chest height during bathing and grooming tasks.   Goal  status: IN PROGRESS    LONG TERM GOALS: Target date: 10/29/22  Pt will decrease pain in LUE to 4/10 or less to improve ability to sleep for 2+ consecutive hours without waking due to pain.   Goal status: IN PROGRESS  2.  Pt will decrease LUE fascial restrictions to min amounts or less to improve mobility required for functional reaching tasks such as threading belt or reaching head.  Goal status: IN PROGRESS  3.  Pt will increase LUE A/ROM to at least 140 degrees flexion and abduction, 60 er, to improve ability to use LUE when reaching overhead or behind back during dressing and bathing tasks.   Goal status: IN PROGRESS  4.  Pt will increase LUE strength to 4/5 or greater to improve ability to use LUE when lifting or carrying items during meal preparation/housework/yardwork tasks.   Goal status: IN PROGRESS  5.  Pt will return to highest level of function using LUE as non-dominant during functional task completion.   Goal status: IN PROGRESS   ASSESSMENT:  CLINICAL IMPRESSION: This session, pt continuing to demonstrate improvement in  his ROM and strength. He was able to tolerate multiple strengthening tasks this session, as well as functional reaching tasks, utilizing weights. With most exercises, he is complaining of pain and discomfort in his elbow down to his middle finger. OT Providing verbal and visual cuing this session for positioning and technique throughout session.   PERFORMANCE DEFICITS: in functional skills including in functional skills including ADLs, IADLs, coordination, tone, ROM, strength, pain, fascial restrictions, muscle spasms, and UE functional use   PLAN:  OT FREQUENCY: 2x/week  OT DURATION: 6 weeks  PLANNED INTERVENTIONS: self care/ADL training, therapeutic exercise, therapeutic activity, neuromuscular re-education, manual therapy, passive range of motion, splinting, electrical stimulation, ultrasound, moist heat, cryotherapy, patient/family education,  and DME and/or AE instructions  CONSULTED AND AGREED WITH PLAN OF CARE: Patient  PLAN FOR NEXT SESSION: continue shoulder strengthening and stability work, follow up on HEP completion, attempt therapy ball strengthening      Trish Mage, OTR/L [phone] 11/23/2022, 12:49 PM

## 2022-11-26 ENCOUNTER — Ambulatory Visit (HOSPITAL_COMMUNITY): Payer: 59 | Admitting: Occupational Therapy

## 2022-11-26 ENCOUNTER — Encounter (HOSPITAL_COMMUNITY): Payer: Self-pay | Admitting: Occupational Therapy

## 2022-11-26 DIAGNOSIS — M25512 Pain in left shoulder: Secondary | ICD-10-CM

## 2022-11-26 DIAGNOSIS — M25612 Stiffness of left shoulder, not elsewhere classified: Secondary | ICD-10-CM | POA: Diagnosis not present

## 2022-11-26 DIAGNOSIS — R29898 Other symptoms and signs involving the musculoskeletal system: Secondary | ICD-10-CM | POA: Diagnosis not present

## 2022-11-26 NOTE — Therapy (Unsigned)
OUTPATIENT OCCUPATIONAL THERAPY ORTHO TREATMENT AND REASSESSMENT/RECERTIFICATION  Patient Name: Travis [name] MRN: [id number] DOB:Feb 21, 1961, 62 y.o., male Today's Date: 11/27/2022   END OF SESSION:  OT End of Session - 11/26/22 2315     Visit Number 15    Number of Visits 19    Date for OT Re-Evaluation 12/31/22    Authorization Type [employer] Employee-$40 copay    Progress Note Due on Visit 18    OT Start Time 1120    OT Stop Time 1204    OT Time Calculation (min) 44 min    Activity Tolerance Patient tolerated treatment well    Behavior During Therapy WFL for tasks assessed/performed             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   Anxiety    Arthritis    Blood transfusion without reported diagnosis    Complication of anesthesia    bleed after last shoulder-had to stay overnight   Depression    Diabetes mellitus    Diabetic peripheral neuropathy (HCC) 03/28/2013   Diverticulosis    Dizziness    Hernia, inguinal    Hyperlipidemia    Hypertension    Impingement syndrome of left shoulder 08/02/2014   Ischemic colitis (HCC)    Multi-infarct state 10/14/2014   Neuropathy of lower extremity    Night sweats    every once in a while   Sleep apnea    no CPAP   Stroke (HCC) 02/2015   Syncope and collapse    Past Surgical History:  Procedure Laterality Date   BIOPSY  06/14/2019   Procedure: BIOPSY;  Surgeon: Sherrilyn Rist, MD;  Location: [facility];  Service: Gastroenterology;;   CERVICAL DISC SURGERY     COLON SURGERY  2003   1/3 removed for diverticulitis   COLONOSCOPY     COLONOSCOPY WITH PROPOFOL N/A 06/14/2019   Procedure: COLONOSCOPY WITH PROPOFOL;  Surgeon: Sherrilyn Rist, MD;  Location: [facility];  Service: Gastroenterology;  Laterality: N/A;   EP IMPLANTABLE DEVICE N/A 05/01/2015   Procedure: Loop Recorder Insertion;  Surgeon: Duke Salvia, MD;  Location: [facility] INVASIVE CV LAB;  Service: Cardiovascular;  Laterality: N/A;   INGUINAL HERNIA REPAIR Bilateral    KNEE ARTHROSCOPY Right    SHOULDER ARTHROSCOPY Right    1   SHOULDER ARTHROSCOPY Left 08/02/2014   Procedure: LEFT SHOULDER SCOPE DEBRIDEMENT/ACROMIOPLASTY;  Surgeon: Teryl Lucy, MD;  Location: [facility];  Service: Orthopedics;  Laterality: Left;  ANESTHESIA: GENERAL, PRE/POST OP SCALENE   TEE WITHOUT CARDIOVERSION N/A 03/03/2015   Procedure: TRANSESOPHAGEAL ECHOCARDIOGRAM (TEE);  Surgeon: Laqueta Linden, MD;  Location: [facility];  Service: Cardiology;  Laterality: N/A;   UMBILICAL HERNIA REPAIR     with other hernia repair with mesh   WRIST SURGERY Right    fusion   Patient Active Problem List   Diagnosis Date Noted   Hypercoagulable state due to paroxysmal atrial fibrillation (HCC) 07/19/2022   Nontraumatic incomplete tear of left rotator cuff 07/07/2022   Arthritis of left acromioclavicular joint 07/07/2022   Right groin hernia 03/03/2021   Hyperlipidemia associated with type 2 diabetes mellitus (HCC) 12/25/2020   Tendinopathy of right rotator cuff 09/25/2020   Arthrosis of right acromioclavicular joint 09/25/2020   Adrenal adenoma, left 09/07/2019   Chronic anticoagulation    GI bleed 06/14/2019  Vitamin D deficiency 04/18/2019   History of stroke 04/20/2018   GAD (generalized anxiety disorder) 05/15/2016   Acute bronchiolitis due to respiratory syncytial virus (RSV)    Acute CVA (cerebrovascular accident) (HCC) 05/04/2016   Dizziness 05/02/2016   Anxiety and depression 05/02/2016   Paroxysmal atrial fibrillation (HCC) 03/02/2016   PFO (patent foramen ovale) 07/24/2015   OSA (obstructive sleep apnea) 04/18/2015   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 artery (HCC) 04/18/2015   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 Lilyan Punt REFERRING PROVIDER: Jari Sportsman, PA-C  ONSET DATE: 09/01/22  REFERRING DIAG: S/P arthroscopy of left shoulder   THERAPY DIAG:  Acute pain of left shoulder  Stiffness of left shoulder, not elsewhere classified  Other symptoms and signs involving the musculoskeletal system  Rationale for Evaluation and Treatment: Rehabilitation  SUBJECTIVE:   SUBJECTIVE STATEMENT: S: "I'm just sore."   PERTINENT HISTORY: Pt is a 62 y/o male s/p S/P arthroscopy of left shoulder on 09/01/22. Pt presents without sling, reports he is no longer wearing.   PRECAUTIONS: Shoulder  WEIGHT BEARING RESTRICTIONS: No  PAIN:  Are you having pain? Yes: NPRS scale: 3/10 Pain location: both shoulder Pain description: aching Aggravating factors: movement, reaching up Relieving factors: rest  FALLS: Has patient fallen in last 6 months? No  PATIENT GOALS: To be able to use the LUE during ADLs.   NEXT MD VISIT: 11/23/22  OBJECTIVE:   HAND DOMINANCE: Right  ADLs: Overall ADLs: Pt is having difficulty with reaching up overhead, behind back. Pt is unable to sleep on the left side, wakes up at night due to pain. Pt is having difficulty with dressing, bathing and reaching head and back. Pt is unable to operate belt buckles.   FUNCTIONAL OUTCOME MEASURES: FOTO: 49/100 10/08/22: 53/100 10/22/22: 59.60/100 11/26/22: 54.89/100  UPPER EXTREMITY ROM:       Assessed in supine, er/IR adducted  Passive ROM Left eval Left 10/08/22  Shoulder flexion 129  146  Shoulder abduction 113 155  Shoulder internal rotation 90 90  Shoulder external rotation 64 73  (Blank rows = not tested)    Assessed in seated, er/IR adducted    Active ROM Left eval Left 10/08/22 Left 10/22/22 Left 11/09/22 Left 11/26/22  Shoulder flexion 81 109 116 132 129  Shoulder abduction 48 102 124 144 149  Shoulder internal rotation 90 90 90 90 90  Shoulder external rotation 62 70 88 85 86  (Blank rows = not tested)  UPPER EXTREMITY MMT:     Not tested due to precautions.   MMT Left eval Left 10/08/22 Left 10/22/22 Left 11/09/22 Left 11/26/22  Shoulder flexion  3/5 3+/5 3+/5 4-/5  Shoulder abduction  3/5 3/5 3/5 3+/5  Shoulder internal rotation  4/5 4-/5 4/5 4+/5  Shoulder external rotation  4-/5 4/5 4-/5 4/5  (Blank rows = not tested)  SENSATION: Pt reports dullness in anterior shoulder region  OBSERVATIONS: mod fascial restrictions along upper arm, anterior shoulder, and  trapezius regions   TODAY'S TREATMENT:                                                                                                                              DATE:  11/26/22 -A/ROM: seated, flexion, abduction, protraction, horizontal abduction, er/IR, x15 -Shoulder Strengthening: 3lb flexion, abduction, protraction, horizontal abduction, er/IR, x12 -X to V arms x10 -Goal Post Arms x10 -stretching: flexion, er, ir towel stretch and er towel stretch -Measurements for Reassessment  11/23/22 -A/ROM: seated, flexion, abduction, protraction, horizontal abduction, er/IR, x15 -functional reaching: 2lb dumbbell, x5 shoulder height, head height, and overhead height, 3lb dumbbell, x5, shoulder height, head height, x3 overhead height -Scapular strengthening: green band, extension, retraction, rows,x 15 -Shoulder Strengthening: horizontal abduction, er, ir, flexion, abduction, x15 -rebounder: yellow weighted ball x10, blue weighted ball x6,   11/19/22 -A/ROM: seated, flexion, abduction,  protraction, horizontal abduction, er/IR, 12 -PNF Strengthening: green bands, Chest Pulls, head height pulls, er pulls, PNF up, PNF down, x10 -Loop Band Exercises: Wall Taps, Wall V ups, Wall Clocks, x12 -Proximal Shoulder Exercises: paddles, criss cross, circles both directions, x10 -Functional Reaching: counter to shoulder height taps, overhead shelf taps, x10   PATIENT EDUCATION: Education details: Continue HEP Person educated: Patient Education method: Explanation, Demonstration, and Handouts Education comprehension: verbalized understanding and returned demonstration  HOME EXERCISE PROGRAM: Eval: table slides 5/24: AA/ROM 5/31: A/ROM  6/18: Scapular Strengthening 6/28: Shoulder strengthening 7/19: PNF Strengthening   GOALS: Goals reviewed with patient? Yes   SHORT TERM GOALS: Target date: 10/08/22  Pt will be provided with and educated on HEP to improve mobility in LUE required for use during ADL completion.   Goal status: IN PROGRESS  2.  Pt will increase LUE P/ROM by 30 degrees or greater to improve ability to use LUE during dressing tasks with minimal compensatory techniques.   Goal status: MET  3.  Pt will increase LUE strength to 3+/5 to improve ability to reach for items at waist to chest height during bathing and grooming tasks.   Goal status: MET    LONG TERM GOALS: Target date: 10/29/22  Pt will decrease pain in LUE to 4/10 or less to improve ability to sleep for 2+ consecutive hours without waking due to pain.   Goal status: IN PROGRESS  2.  Pt will decrease LUE fascial restrictions to min amounts or less to improve mobility required for functional reaching tasks such as threading belt or reaching head.  Goal status: IN PROGRESS  3.  Pt will increase LUE A/ROM to at least 140 degrees flexion and abduction, 60 er, to improve ability to use LUE when reaching overhead or behind back during dressing and bathing tasks.   Goal status: IN PROGRESS  4.  Pt  will increase LUE strength to 4+/5 or greater to improve ability to use LUE when lifting or carrying items during meal preparation/housework/yardwork tasks.   Goal status: REVISED  5.  Pt will return  to highest level of function using LUE as non-dominant during functional task completion.   Goal status: IN PROGRESS   ASSESSMENT:  CLINICAL IMPRESSION: This session, pt was reassessed for recertification. He demonstrated continued ROM with pain in end ranges and mild increased strength in all motions. Pt continuing to work on strengthening and mobility, including adding stretches this session. OT providing verbal and tactile cuing as needed throughout session for positioning and techniques.  PERFORMANCE DEFICITS: in functional skills including in functional skills including ADLs, IADLs, coordination, tone, ROM, strength, pain, fascial restrictions, muscle spasms, and UE functional use   PLAN:  OT FREQUENCY: 2x/week  OT DURATION: 6 weeks  PLANNED INTERVENTIONS: self care/ADL training, therapeutic exercise, therapeutic activity, neuromuscular re-education, manual therapy, passive range of motion, splinting, electrical stimulation, ultrasound, moist heat, cryotherapy, patient/family education, and DME and/or AE instructions  CONSULTED AND AGREED WITH PLAN OF CARE: Patient  PLAN FOR NEXT SESSION: continue shoulder strengthening and stability work, follow up on HEP completion, attempt therapy ball strengthening      Trish Mage, OTR/L [phone] 11/27/2022, 11:19 PM

## 2022-11-27 ENCOUNTER — Other Ambulatory Visit (HOSPITAL_COMMUNITY): Payer: Self-pay

## 2022-11-29 DIAGNOSIS — D225 Melanocytic nevi of trunk: Secondary | ICD-10-CM | POA: Diagnosis not present

## 2022-11-29 DIAGNOSIS — D485 Neoplasm of uncertain behavior of skin: Secondary | ICD-10-CM | POA: Diagnosis not present

## 2022-11-29 DIAGNOSIS — Z1283 Encounter for screening for malignant neoplasm of skin: Secondary | ICD-10-CM | POA: Diagnosis not present

## 2022-11-30 ENCOUNTER — Encounter: Payer: Self-pay | Admitting: Family Medicine

## 2022-11-30 ENCOUNTER — Other Ambulatory Visit (HOSPITAL_COMMUNITY): Payer: Self-pay

## 2022-11-30 ENCOUNTER — Ambulatory Visit: Payer: 59 | Admitting: Family Medicine

## 2022-11-30 VITALS — BP 118/66 | HR 68 | Temp 97.3°F | Ht 73.0 in | Wt 184.0 lb

## 2022-11-30 DIAGNOSIS — Z0001 Encounter for general adult medical examination with abnormal findings: Secondary | ICD-10-CM

## 2022-11-30 DIAGNOSIS — E1159 Type 2 diabetes mellitus with other circulatory complications: Secondary | ICD-10-CM | POA: Diagnosis not present

## 2022-11-30 DIAGNOSIS — E1169 Type 2 diabetes mellitus with other specified complication: Secondary | ICD-10-CM | POA: Diagnosis not present

## 2022-11-30 DIAGNOSIS — E785 Hyperlipidemia, unspecified: Secondary | ICD-10-CM | POA: Diagnosis not present

## 2022-11-30 DIAGNOSIS — I10 Essential (primary) hypertension: Secondary | ICD-10-CM | POA: Diagnosis not present

## 2022-11-30 DIAGNOSIS — Z0000 Encounter for general adult medical examination without abnormal findings: Secondary | ICD-10-CM

## 2022-11-30 DIAGNOSIS — F411 Generalized anxiety disorder: Secondary | ICD-10-CM | POA: Diagnosis not present

## 2022-11-30 MED ORDER — ATORVASTATIN CALCIUM 20 MG PO TABS
20.0000 mg | ORAL_TABLET | Freq: Every day | ORAL | 1 refills | Status: DC
  Filled 2022-11-30 – 2023-02-11 (×2): qty 90, 90d supply, fill #0
  Filled 2023-05-14: qty 90, 90d supply, fill #1

## 2022-11-30 MED ORDER — FLUOXETINE HCL 20 MG PO CAPS
20.0000 mg | ORAL_CAPSULE | Freq: Every day | ORAL | 1 refills | Status: DC
  Filled 2022-11-30 – 2023-03-11 (×2): qty 90, 90d supply, fill #0

## 2022-11-30 NOTE — Progress Notes (Signed)
Subjective:    Patient ID: Travis [name], male    DOB: December 20, 1960, 62 y.o.   MRN: [id number]  HPI The patient comes in today for a wellness visit.  Patient is trying to eat healthy Takes his medicines regular basis A1c running higher than what we would like to see he has an appointment coming up with endocrinology in the near future  A review of their health history was completed.  A review of medications was also completed.  Any needed refills;   Eating habits: good  Falls/  MVA accidents in past few months: no  Regular exercise: stays active  Specialist pt sees on regular basis: cardiology, orthopedics, endocrine  Preventative health issues were discussed.  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   Anxiety    Arthritis    Blood transfusion without reported diagnosis    Complication of anesthesia    bleed after last shoulder-had to stay overnight   Depression    Diabetes mellitus    Diabetic peripheral neuropathy (HCC) 03/28/2013   Diverticulosis    Dizziness    Hernia, inguinal    Hyperlipidemia    Hypertension    Impingement syndrome of left shoulder 08/02/2014   Ischemic colitis (HCC)    Multi-infarct state 10/14/2014   Neuropathy of lower extremity    Night sweats    every once in a while   Sleep apnea    no CPAP   Stroke (HCC) 02/2015   Syncope and collapse    Outpatient Encounter Medications as of 11/30/2022  Medication Sig   Accu-Chek FastClix Lancets MISC Use to check blood sugar twice daily.   ALPRAZolam (XANAX) 0.25 MG tablet Take 0.5 tablets (0.125 mg total) by mouth in the morning AND 1 tablet (0.25 mg total) daily in the afternoon AND 1 tablet (0.25 mg total) every evening. Take as needed   apixaban (ELIQUIS) 5 MG TABS tablet Take 1 tablet (5 mg total) by mouth 2 (two) times daily.   atorvastatin (LIPITOR) 20 MG tablet Take 1 tablet (20 mg total)  by mouth daily.   Blood Glucose Monitoring Suppl (FREESTYLE LITE) w/Device KIT Use to check glucose twice daily   Blood Pressure Monitoring (OMRON 3 SERIES BP MONITOR) DEVI USE AS DIRECTED.   Cholecalciferol 50 MCG (2000 UT) CAPS Take 1 capsule (2,000 Units total) by mouth daily with breakfast.   Continuous Blood Gluc Sensor (DEXCOM G7 SENSOR) MISC Inject into the skin as directed to check continuous blood sugar. Change sensor every 10 days as directed.   COVID-19 mRNA vaccine 2023-2024 (COMIRNATY) syringe Inject into the muscle.   FLUoxetine (PROZAC) 20 MG capsule Take 1 capsule by mouth daily.   fluticasone (FLONASE) 50 MCG/ACT nasal spray Place 1 spray into both nostrils daily.   folic acid (FOLVITE) 1 MG tablet Take 1 tablet (1 mg) by mouth daily.   gabapentin (NEURONTIN) 300 MG capsule Take 1 capsule (300 mg total) by mouth every morning AND 1 capsule (300 mg total) daily after lunch AND 2 capsules (600 mg total) at bedtime.   glucose blood (ACCU-CHEK GUIDE) test strip USE AS DIRECTED TO TEST BLOOD SUGAR 2 TIMES DAILY AS DIRECTED   hyoscyamine (LEVSIN SL) 0.125 MG SL tablet Place 1 tablet (0.125 mg total) under the tongue daily before breakfast.   insulin degludec (TRESIBA FLEXTOUCH) 100 UNIT/ML FlexTouch Pen Inject 30 Units into the skin  at bedtime.   Insulin Pen Needle (COMFORT EZ PEN NEEDLES) 31G X 6 MM MISC USE TO INJECT INSULIN DAILY AS DIRECTED   Insulin Pen Needle (UNIFINE PENTIPS) 31G X 6 MM MISC USE TO INJECT INSULIN DAILY AS DIRECTED   metFORMIN (GLUCOPHAGE) 500 MG tablet Take 1 tablet (500 mg total) by mouth 2 (two) times daily with a meal.   methocarbamol (ROBAXIN) 750 MG tablet Take 1 tablet (750 mg total) by mouth 2 (two) times daily as needed for muscle spasms.   Multiple Vitamin (MULTIVITAMIN WITH MINERALS) TABS tablet Take 1 tablet daily by mouth.   ondansetron (ZOFRAN) 4 MG tablet Take 1-2 tablets (4-8 mg total) by mouth every 8 (eight) hours as needed for nausea or vomiting.    RSV vaccine recomb adjuvanted (AREXVY) 120 MCG/0.5ML injection Inject into the muscle.   valACYclovir (VALTREX) 1000 MG tablet Take 1 tablet by mouth daily.   [DISCONTINUED] Insulin Glargine (BASAGLAR KWIKPEN) 100 UNIT/ML INJECT 50 UNITS INTO THE SKIN AT BEDTIME   No facility-administered encounter medications on file as of 11/30/2022.    Additional concerns: none  Results for orders placed or performed in visit on 10/12/22  Microalbumin / creatinine urine ratio  Result Value Ref Range   Creatinine, Urine 142.9 Not Estab. mg/dL   Microalbumin, Urine 22.0 Not Estab. ug/mL   Microalb/Creat Ratio 19 0 - 29 mg/g creat  Hemoglobin A1c  Result Value Ref Range   Hgb A1c MFr Bld 7.7 (H) 4.8 - 5.6 %   Est. average glucose Bld gHb Est-mCnc 174 mg/dL  Basic Metabolic Panel  Result Value Ref Range   Glucose 184 (H) 70 - 99 mg/dL   BUN 14 8 - 27 mg/dL   Creatinine, Ser 2.54 0.76 - 1.27 mg/dL   eGFR 87 >27 CW/CBJ/6.28   BUN/Creatinine Ratio 14 10 - 24   Sodium 142 134 - 144 mmol/L   Potassium 4.5 3.5 - 5.2 mmol/L   Chloride 107 (H) 96 - 106 mmol/L   CO2 22 20 - 29 mmol/L   Calcium 9.2 8.6 - 10.2 mg/dL  Lipid panel  Result Value Ref Range   Cholesterol, Total 101 100 - 199 mg/dL   Triglycerides 315 0 - 149 mg/dL   HDL 33 (L) >17 mg/dL   VLDL Cholesterol Cal 22 5 - 40 mg/dL   LDL Chol Calc (NIH) 46 0 - 99 mg/dL   Chol/HDL Ratio 3.1 0.0 - 5.0 ratio  Hepatic function panel  Result Value Ref Range   Total Protein 6.8 6.0 - 8.5 g/dL   Albumin 4.4 3.9 - 4.9 g/dL   Bilirubin Total 0.4 0.0 - 1.2 mg/dL   Bilirubin, Direct 6.16 0.00 - 0.40 mg/dL   Alkaline Phosphatase 88 44 - 121 IU/L   AST 18 0 - 40 IU/L   ALT 6 0 - 44 IU/L  PSA  Result Value Ref Range   Prostate Specific Ag, Serum 1.3 0.0 - 4.0 ng/mL   *Note: Due to a large number of results and/or encounters for the requested time period, some results have not been displayed. A complete set of results can be found in Results Review.      Review of Systems     Objective:   Physical Exam  General-in no acute distress Eyes-no discharge Lungs-respiratory rate normal, CTA CV-no murmurs,RRR Extremities skin warm dry no edema Neuro grossly normal Behavior normal, alert       Assessment & Plan:   1. Well adult exam Adult wellness-complete.wellness  physical was conducted today. Importance of diet and exercise were discussed in detail.  Importance of stress reduction and healthy living were discussed.  In addition to this a discussion regarding safety was also covered.  We also reviewed over immunizations and gave recommendations regarding current immunization needed for age.   In addition to this additional areas were also touched on including: Preventative health exams needed:  Colonoscopy 2031  Patient was advised yearly wellness exam   2. GAD (generalized anxiety disorder) May continue Xanax but no greater than current dosing I encouraged him to try to cut back to less than 2 tablets/day long-term and be beneficial if he could cut back more but it is very difficult for this patient to do so because of this long-term use of this medicine plus long-term anxiety issues  3. Essential hypertension HTN- patient seen for follow-up regarding HTN.   Diet, medication compliance, appropriate labs and refills were completed.   Importance of keeping blood pressure under good control to lessen the risk of complications discussed Regular follow-up visits discussed   4. DM type 2 causing vascular disease (HCC) Follows with endocrinology for this  5. Hyperlipidemia associated with type 2 diabetes mellitus (HCC) Continues medication lab work overall look good

## 2022-12-07 ENCOUNTER — Ambulatory Visit: Payer: 59 | Admitting: Nurse Practitioner

## 2022-12-07 ENCOUNTER — Other Ambulatory Visit (HOSPITAL_COMMUNITY): Payer: Self-pay

## 2022-12-07 ENCOUNTER — Encounter: Payer: Self-pay | Admitting: Nurse Practitioner

## 2022-12-07 DIAGNOSIS — E785 Hyperlipidemia, unspecified: Secondary | ICD-10-CM | POA: Diagnosis not present

## 2022-12-07 DIAGNOSIS — D3500 Benign neoplasm of unspecified adrenal gland: Secondary | ICD-10-CM | POA: Diagnosis not present

## 2022-12-07 DIAGNOSIS — Z7984 Long term (current) use of oral hypoglycemic drugs: Secondary | ICD-10-CM

## 2022-12-07 DIAGNOSIS — E1159 Type 2 diabetes mellitus with other circulatory complications: Secondary | ICD-10-CM | POA: Diagnosis not present

## 2022-12-07 DIAGNOSIS — Z794 Long term (current) use of insulin: Secondary | ICD-10-CM

## 2022-12-07 DIAGNOSIS — I10 Essential (primary) hypertension: Secondary | ICD-10-CM | POA: Diagnosis not present

## 2022-12-07 DIAGNOSIS — D3502 Benign neoplasm of left adrenal gland: Secondary | ICD-10-CM

## 2022-12-07 DIAGNOSIS — E782 Mixed hyperlipidemia: Secondary | ICD-10-CM

## 2022-12-07 DIAGNOSIS — E559 Vitamin D deficiency, unspecified: Secondary | ICD-10-CM

## 2022-12-07 MED ORDER — TRESIBA FLEXTOUCH 100 UNIT/ML ~~LOC~~ SOPN
30.0000 [IU] | PEN_INJECTOR | Freq: Every day | SUBCUTANEOUS | 3 refills | Status: DC
  Filled 2022-12-07: qty 30, 100d supply, fill #0
  Filled 2023-02-11: qty 30, 90d supply, fill #0

## 2022-12-07 MED ORDER — DEXCOM G7 SENSOR MISC
1.0000 | 3 refills | Status: DC
Start: 2022-12-07 — End: 2023-04-12
  Filled 2022-12-07: qty 9, 90d supply, fill #0
  Filled 2022-12-10 – 2022-12-17 (×2): qty 3, 30d supply, fill #0
  Filled 2023-02-14 (×2): qty 3, 30d supply, fill #1
  Filled 2023-03-11: qty 3, 30d supply, fill #2
  Filled 2023-04-08: qty 3, 30d supply, fill #3

## 2022-12-07 MED ORDER — METFORMIN HCL 500 MG PO TABS
500.0000 mg | ORAL_TABLET | Freq: Two times a day (BID) | ORAL | 3 refills | Status: DC
  Filled 2022-12-07 – 2023-01-15 (×2): qty 180, 90d supply, fill #0
  Filled 2023-04-08: qty 180, 90d supply, fill #1
  Filled 2023-07-15: qty 180, 90d supply, fill #2

## 2022-12-07 NOTE — Progress Notes (Signed)
12/07/2022, 1:57 PM              Endocrinology follow-up note    Subjective:    Patient ID: Travis [name], male    DOB: 01-07-1961.  Travis [name] is being seen in follow-up  for management of currently uncontrolled symptomatic diabetes requested by  Babs Sciara, MD.   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   Anxiety    Arthritis    Blood transfusion without reported diagnosis    Complication of anesthesia    bleed after last shoulder-had to stay overnight   Depression    Diabetes mellitus    Diabetic peripheral neuropathy (HCC) 03/28/2013   Diverticulosis    Dizziness    Hernia, inguinal    Hyperlipidemia    Hypertension    Impingement syndrome of left shoulder 08/02/2014   Ischemic colitis (HCC)    Multi-infarct state 10/14/2014   Neuropathy of lower extremity    Night sweats    every once in a while   Sleep apnea    no CPAP   Stroke (HCC) 02/2015   Syncope and collapse    Past Surgical History:  Procedure Laterality Date   BIOPSY  06/14/2019   Procedure: BIOPSY;  Surgeon: Sherrilyn Rist, MD;  Location: [facility];  Service: Gastroenterology;;   CERVICAL DISC SURGERY     COLON SURGERY  2003   1/3 removed for diverticulitis   COLONOSCOPY     COLONOSCOPY WITH PROPOFOL N/A 06/14/2019   Procedure: COLONOSCOPY WITH PROPOFOL;  Surgeon: Sherrilyn Rist, MD;  Location: [facility];  Service: Gastroenterology;  Laterality: N/A;   EP IMPLANTABLE DEVICE N/A 05/01/2015   Procedure: Loop Recorder Insertion;  Surgeon: Duke Salvia, MD;  Location: [facility] CV [facility];  Service: Cardiovascular;  Laterality: N/A;   INGUINAL HERNIA REPAIR Bilateral    KNEE ARTHROSCOPY Right    SHOULDER ARTHROSCOPY Right    1   SHOULDER ARTHROSCOPY Left 08/02/2014   Procedure: LEFT SHOULDER SCOPE  DEBRIDEMENT/ACROMIOPLASTY;  Surgeon: Teryl Lucy, MD;  Location: [facility];  Service: Orthopedics;  Laterality: Left;  ANESTHESIA: GENERAL, PRE/POST OP SCALENE   TEE WITHOUT CARDIOVERSION N/A 03/03/2015   Procedure: TRANSESOPHAGEAL ECHOCARDIOGRAM (TEE);  Surgeon: Laqueta Linden, MD;  Location: [facility];  Service: Cardiology;  Laterality: N/A;   UMBILICAL HERNIA REPAIR     with other hernia repair with mesh   WRIST SURGERY Right    fusion   Social History   Socioeconomic History   Marital status: Married    Spouse name: Not on file   Number of children: 2   Years of education: College   Highest education level: Some college, no degree  Occupational History   Occupation: disabled  Tobacco Use   Smoking status: Former    Current packs/day: 0.00    Average packs/day: 1 pack/day for 8.0 years (8.0 ttl pk-yrs)    Types: Cigarettes    Start date: 06/07/1970    Quit date: 05/03/1978    Years since quitting: 44.6   Smokeless tobacco: Former    Types:  Dorna Bloom    Quit date: 05/03/1978   Tobacco comments:    Former smoker 07/19/22  Vaping Use   Vaping status: Never Used  Substance and Sexual Activity   Alcohol use: Yes    Alcohol/week: 1.0 standard drink of alcohol    Types: 1 Cans of beer per week    Comment: 1 beer every 6 months h/o heavy use in the past 07/19/22   Drug use: No   Sexual activity: Yes  Other Topics Concern   Not on file  Social History Narrative   Drinks some coffee, Drink diet sodas and tea   Social Determinants of Health   Financial Resource Strain: Low Risk  (10/10/2022)   Overall Financial Resource Strain (CARDIA)    Difficulty of Paying Living Expenses: Not hard at all  Food Insecurity: No Food Insecurity (10/10/2022)   Hunger Vital Sign    Worried About Running Out of Food in the Last Year: Never true    Ran Out of Food in the Last Year: Never true  Transportation Needs: No Transportation Needs (10/10/2022)   PRAPARE - Therapist, art (Medical): No    Lack of Transportation (Non-Medical): No  Physical Activity: Unknown (10/10/2022)   Exercise Vital Sign    Days of Exercise per Week: 0 days    Minutes of Exercise per Session: Not on file  Stress: No Stress Concern Present (10/10/2022)   [employer] of Occupational Health - Occupational Stress Questionnaire    Feeling of Stress : Only a little  Social Connections: Moderately Integrated (10/10/2022)   Social Connection and Isolation Panel [NHANES]    Frequency of Communication with Friends and Family: More than three times a week    Frequency of Social Gatherings with Friends and Family: Three times a week    Attends Religious Services: 1 to 4 times per year    Active Member of Clubs or Organizations: No    Attends Banker Meetings: Not on file    Marital Status: Married   Outpatient Encounter Medications as of 12/07/2022  Medication Sig   Accu-Chek FastClix Lancets MISC Use to check blood sugar twice daily.   ALPRAZolam (XANAX) 0.25 MG tablet Take 0.5 tablets (0.125 mg total) by mouth in the morning AND 1 tablet (0.25 mg total) daily in the afternoon AND 1 tablet (0.25 mg total) every evening. Take as needed   apixaban (ELIQUIS) 5 MG TABS tablet Take 1 tablet (5 mg total) by mouth 2 (two) times daily.   atorvastatin (LIPITOR) 20 MG tablet Take 1 tablet (20 mg total) by mouth daily.   Blood Glucose Monitoring Suppl (FREESTYLE LITE) w/Device KIT Use to check glucose twice daily   Blood Pressure Monitoring (OMRON 3 SERIES BP MONITOR) DEVI USE AS DIRECTED.   Cholecalciferol 50 MCG (2000 UT) CAPS Take 1 capsule (2,000 Units total) by mouth daily with breakfast.   COVID-19 mRNA vaccine 2023-2024 (COMIRNATY) syringe Inject into the muscle.   FLUoxetine (PROZAC) 20 MG capsule Take 1 capsule by mouth daily.   fluticasone (FLONASE) 50 MCG/ACT nasal spray Place 1 spray into both nostrils daily.   folic acid (FOLVITE) 1 MG tablet Take 1 tablet (1  mg) by mouth daily.   gabapentin (NEURONTIN) 300 MG capsule Take 1 capsule (300 mg total) by mouth every morning AND 1 capsule (300 mg total) daily after lunch AND 2 capsules (600 mg total) at bedtime.   glucose blood (ACCU-CHEK GUIDE) test strip USE AS DIRECTED  TO TEST BLOOD SUGAR 2 TIMES DAILY AS DIRECTED   hyoscyamine (LEVSIN SL) 0.125 MG SL tablet Place 1 tablet (0.125 mg total) under the tongue daily before breakfast.   Insulin Pen Needle (COMFORT EZ PEN NEEDLES) 31G X 6 MM MISC USE TO INJECT INSULIN DAILY AS DIRECTED   Insulin Pen Needle (UNIFINE PENTIPS) 31G X 6 MM MISC USE TO INJECT INSULIN DAILY AS DIRECTED   methocarbamol (ROBAXIN) 750 MG tablet Take 1 tablet (750 mg total) by mouth 2 (two) times daily as needed for muscle spasms.   Multiple Vitamin (MULTIVITAMIN WITH MINERALS) TABS tablet Take 1 tablet daily by mouth.   ondansetron (ZOFRAN) 4 MG tablet Take 1-2 tablets (4-8 mg total) by mouth every 8 (eight) hours as needed for nausea or vomiting.   RSV vaccine recomb adjuvanted (AREXVY) 120 MCG/0.5ML injection Inject into the muscle.   valACYclovir (VALTREX) 1000 MG tablet Take 1 tablet by mouth daily.   [DISCONTINUED] insulin degludec (TRESIBA FLEXTOUCH) 100 UNIT/ML FlexTouch Pen Inject 30 Units into the skin at bedtime.   [DISCONTINUED] metFORMIN (GLUCOPHAGE) 500 MG tablet Take 1 tablet (500 mg total) by mouth 2 (two) times daily with a meal.   Continuous Glucose Sensor (DEXCOM G7 SENSOR) MISC Inject into the skin as directed to check continuous blood sugar. Change sensor every 10 days as directed.   insulin degludec (TRESIBA FLEXTOUCH) 100 UNIT/ML FlexTouch Pen Inject 30 Units into the skin at bedtime.   metFORMIN (GLUCOPHAGE) 500 MG tablet Take 1 tablet (500 mg total) by mouth 2 (two) times daily with a meal.   [DISCONTINUED] Continuous Blood Gluc Sensor (DEXCOM G7 SENSOR) MISC Inject into the skin as directed to check continuous blood sugar. Change sensor every 10 days as directed.  (Patient not taking: Reported on 12/07/2022)   [DISCONTINUED] Insulin Glargine (BASAGLAR KWIKPEN) 100 UNIT/ML INJECT 50 UNITS INTO THE SKIN AT BEDTIME   No facility-administered encounter medications on file as of 12/07/2022.    ALLERGIES: Allergies  Allergen Reactions   Lisinopril Cough    Patient/spouse is not aware/familiar with why this is listed as an allergy Not a true allergy but on this list to avoid ACE inhibitors-SA Luking MD    VACCINATION STATUS: Immunization History  Administered Date(s) Administered   COVID-19, mRNA, vaccine(Comirnaty)12 years and older 05/10/2022   Influenza Split 03/28/2013   Influenza,inj,Quad PF,6+ Mos 02/18/2014, 01/23/2015, 02/06/2016, 02/23/2017, 02/24/2018, 01/31/2020, 02/18/2021   Influenza-Unspecified 01/02/2011, 02/09/2019, 03/17/2022   Moderna Sars-Covid-2 Vaccination 07/21/2019, 08/22/2019, 05/01/2020   PNEUMOCOCCAL CONJUGATE-20 10/01/2021   Pneumococcal Polysaccharide-23 01/31/2010, 03/01/2015   Respiratory Syncytial Virus Vaccine,Recomb Aduvanted(Arexvy) 05/18/2022   Tdap 02/21/2003, 02/18/2014   Zoster Recombinant(Shingrix) 04/19/2019, 06/29/2019    Diabetes He presents for his follow-up diabetic visit. He has type 2 diabetes mellitus. Onset time: He was diagnosed at approximate age of 89 years. His disease course has been stable (He has prior history of heavy alcohol use/abuse.). There are no hypoglycemic associated symptoms. Pertinent negatives for hypoglycemia include no confusion, headaches, pallor or seizures. Associated symptoms include foot paresthesias. Pertinent negatives for diabetes include no chest pain, no fatigue, no polydipsia, no polyphagia, no polyuria and no weakness. There are no hypoglycemic complications. Symptoms are stable. Diabetic complications include a CVA, nephropathy and peripheral neuropathy. Risk factors for coronary artery disease include dyslipidemia, diabetes mellitus, hypertension, family history, male sex,  tobacco exposure and sedentary lifestyle. Current diabetic treatment includes insulin injections and oral agent (monotherapy). He is compliant with treatment most of the time. His weight is fluctuating  minimally. He is following a generally unhealthy diet. When asked about meal planning, he reported none. He has had a previous visit with a dietitian. He participates in exercise intermittently. (He presents today with his meter showing no readings for the last 2 weeks.  His 30 day average was 151 with only 5 readings.  His most recent A1c, checked by his PCP on 7/18 was 7.7%, increasing from last visit of 7.3%.  He admits he has overindulged in sweet foods (including sun drop sodas) since last visit.  He says he got tired of checking his glucose.  He never heard about the CGM ordered at last visit.) An ACE inhibitor/angiotensin II receptor blocker is not being taken. He does not see a podiatrist.Eye exam is current.  Hyperlipidemia This is a chronic problem. The current episode started more than 1 year ago. The problem is controlled. Recent lipid tests were reviewed and are normal. Exacerbating diseases include chronic renal disease and diabetes. Factors aggravating his hyperlipidemia include smoking. Pertinent negatives include no chest pain, myalgias or shortness of breath. Current antihyperlipidemic treatment includes statins. The current treatment provides moderate improvement of lipids. There are no compliance problems.  Risk factors for coronary artery disease include dyslipidemia, diabetes mellitus, hypertension, male sex, family history and a sedentary lifestyle.  Hypertension This is a chronic problem. The current episode started more than 1 year ago. The problem has been resolved since onset. The problem is controlled. Pertinent negatives include no chest pain, headaches, neck pain, palpitations or shortness of breath. There are no associated agents to hypertension. Risk factors for coronary artery  disease include dyslipidemia, diabetes mellitus, family history, male gender, sedentary lifestyle and smoking/tobacco exposure. Past treatments include nothing. Compliance problems include diet.  Hypertensive end-organ damage includes kidney disease, CAD/MI and CVA. Identifiable causes of hypertension include chronic renal disease and sleep apnea.    Review of systems  Constitutional: + Minimally fluctuating body weight,  current Body mass index is 24.17 kg/m. , no fatigue, no subjective hyperthermia, no subjective hypothermia Eyes: no blurry vision, no xerophthalmia ENT: no sore throat, no nodules palpated in throat, no dysphagia/odynophagia, no hoarseness Cardiovascular: no chest pain, no shortness of breath, no palpitations, no leg swelling Respiratory: no cough, no shortness of breath Gastrointestinal: no nausea/vomiting/diarrhea Musculoskeletal: + bilateral shoulder pain (chronic) Skin: no rashes, no hyperemia Neurological: no tremors, no numbness, no tingling, no dizziness Psychiatric: no depression, no anxiety  Objective:    BP 104/68 (BP Location: Right Arm, Patient Position: Sitting, Cuff Size: Large)   Pulse 92   Ht 6\' 1"  (1.854 m)   Wt 183 lb 3.2 oz (83.1 kg)   BMI 24.17 kg/m   Wt Readings from Last 3 Encounters:  12/07/22 183 lb 3.2 oz (83.1 kg)  11/30/22 184 lb (83.5 kg)  10/12/22 182 lb 6.4 oz (82.7 kg)    BP Readings from Last 3 Encounters:  12/07/22 104/68  11/30/22 118/66  10/12/22 134/82     Physical Exam- Limited  Constitutional:  Body mass index is 24.17 kg/m. , not in acute distress, + inattentive, hyperactive state of mind with rapid/pressured speech Eyes:  EOMI, no exophthalmos Musculoskeletal: no gross deformities, strength intact in all four extremities, no gross restriction of joint movements Skin:  no rashes, no hyperemia Neurological: no tremor with outstretched hands   Diabetic Foot Exam - Simple   No data filed      CMP      Component Value Date/Time   NA 142 11/18/2022 1059  K 4.5 11/18/2022 1059   CL 107 (H) 11/18/2022 1059   CO2 22 11/18/2022 1059   GLUCOSE 184 (H) 11/18/2022 1059   GLUCOSE 144 (H) 08/27/2022 1224   BUN 14 11/18/2022 1059   CREATININE 0.97 11/18/2022 1059   CREATININE 0.82 02/16/2014 1053   CALCIUM 9.2 11/18/2022 1059   PROT 6.8 11/18/2022 1059   ALBUMIN 4.4 11/18/2022 1059   AST 18 11/18/2022 1059   ALT 6 11/18/2022 1059   ALKPHOS 88 11/18/2022 1059   BILITOT 0.4 11/18/2022 1059   GFRNONAA >60 08/27/2022 1224   GFRAA 92 05/30/2020 1156     Diabetic Labs (most recent): Lab Results  Component Value Date   HGBA1C 7.7 (H) 11/18/2022   HGBA1C 7.3 (A) 08/04/2022   HGBA1C 6.8 (A) 04/01/2022   MICROALBUR 5.7 12/07/2019   MICROALBUR 0.8 02/16/2014     Lipid Panel ( most recent) Lipid Panel     Component Value Date/Time   CHOL 101 11/18/2022 1059   TRIG 119 11/18/2022 1059   HDL 33 (L) 11/18/2022 1059   CHOLHDL 3.1 11/18/2022 1059   CHOLHDL 3.2 05/03/2016 0545   VLDL 16 05/03/2016 0545   LDLCALC 46 11/18/2022 1059      Lab Results  Component Value Date   TSH 1.390 07/24/2021   TSH 2.120 12/07/2019   TSH 2.12 12/07/2019   TSH 1.960 03/12/2017   TSH 1.460 10/07/2016   TSH 0.720 05/02/2016   TSH 1.895 10/14/2014   FREET4 1.25 07/24/2021   FREET4 1.19 12/07/2019   FREET4 1.21 10/07/2016      Assessment & Plan:   1) DM type 2 causing vascular disease (HCC)  - Travis [name] has currently uncontrolled symptomatic type 2 DM since  62 years of age.  He presents today with his meter showing no readings for the last 2 weeks.  His 30 day average was 151 with only 5 readings.  His most recent A1c, checked by his PCP on 7/18 was 7.7%, increasing from last visit of 7.3%.  He admits he has overindulged in sweet foods (including sun drop sodas) since last visit.  He says he got tired of checking his glucose.  He never heard about the CGM ordered at last visit.  -his  diabetes is complicated by CVA, history of smoking, history of heavy alcohol use/abuse in the past and Travis [name] remains at a high risk for more acute and chronic complications which include CAD, CVA, CKD, retinopathy, and neuropathy. These are all discussed in detail with the patient.  - Nutritional counseling repeated at each appointment due to patients tendency to fall back in to old habits.  - The patient admits there is a room for improvement in their diet and drink choices. -  Suggestion is made for the patient to avoid simple carbohydrates from their diet including Cakes, Sweet Desserts / Pastries, Ice Cream, Soda (diet and regular), Sweet Tea, Candies, Chips, Cookies, Sweet Pastries, Store Bought Juices, Alcohol in Excess of 1-2 drinks a day, Artificial Sweeteners, Coffee Creamer, and "Sugar-free" Products. This will help patient to have stable blood glucose profile and potentially avoid unintended weight gain.   - I encouraged the patient to switch to unprocessed or minimally processed complex starch and increased protein intake (animal or plant source), fruits, and vegetables.   - Patient is advised to stick to a routine mealtimes to eat 3 meals a day and avoid unnecessary snacks (to snack only to correct hypoglycemia).  - I have approached  him with the following individualized plan to manage diabetes and patient agrees:   - There could be a component of pancreatic endocrine and exocrine insufficiency given his prior history of heavy alcohol use.  Therefore, he may need multiple daily injections of insulin to control diabetes in the future.  -#1 priority in this patient is to avoid hypoglycemia.    -Given the lack of consistent readings, no changes will be made to his medications today.  He is advised to continue Tresiba 30 units SQ nightly and  Metformin 500 mg po twice daily with meals.   -He is advised to CONSISTENTLY monitor blood glucose twice daily, before breakfast and  before bed, and call the clinic if he has readings less than 70 or greater than 200 for 3 tests in a row.  He could benefit from CGM device, I resent in script for Dexcom G7 sensors for him since he never heard about the script sent in at last visit.  -Patient is not a candidate for SGLT2 inhibitors, nor incretin therapy due to his body habitus.  - Patient specific target  A1c;  LDL, HDL, Triglycerides, and  Waist Circumference were discussed in detail.  2) BP/HTN:   His blood pressure is controlled to target.  He is not on any antihypertensive medications at this time.  He will be considered for low dose ARB on subsequent visits if BP becomes elevated over 140/90.  He does monitor BP at home and reports normal readings.  3) Lipids/HPL:  His most recent lipid panel from 11/18/22 shows controlled LDL at 46.  He is advised to continue Lipitor 20 mg po daily at bedtime.  Side effects and precautions discussed with him.    4) Left adrenal adenoma- During a past hospitalization for GI bleed from ischemic colitis a CT scan showed incidental finding of 2.1 cm left adrenal adenoma.  Subsequent plasma metanephrines were within normal limits, considered nonfunctional.  He will not need intervention for it at this time. His repeat 24-hr urine metanephrines, catecholamines, cortisol, and aldosterone were all still normal favoring benignity.    5)  Weight/Diet: His Body mass index is 24.17 kg/m.  He is not a weight loss candidate.  CDE Consult  is in progress , exercise, and detailed carbohydrates information provided.  If he experiences unintentional weight loss, he will be considered for Creon therapy.   6) Chronic Care/Health Maintenance: -he is on Statin medications and  is encouraged to continue to follow up with Ophthalmology, Dentist,  Podiatrist at least yearly or according to recommendations, and advised to  stay away from smoking. I have recommended yearly flu vaccine and pneumonia vaccination at  least every 5 years; moderate intensity exercise for up to 150 minutes weekly; and  sleep for at least 7 hours a day.  - I advised patient to maintain close follow up with Babs Sciara, MD for primary care needs.     I spent  43  minutes in the care of the patient today including review of labs from CMP, Lipids, Thyroid Function, Hematology (current and previous including abstractions from other facilities); face-to-face time discussing  his blood glucose readings/logs, discussing hypoglycemia and hyperglycemia episodes and symptoms, medications doses, his options of short and long term treatment based on the latest standards of care / guidelines;  discussion about incorporating lifestyle medicine;  and documenting the encounter. Risk reduction counseling performed per USPSTF guidelines to reduce obesity and cardiovascular risk factors.     Please refer to  Patient Instructions for Blood Glucose Monitoring and Insulin/Medications Dosing Guide"  in media tab for additional information. Please  also refer to " Patient Self Inventory" in the Media  tab for reviewed elements of pertinent patient history.  Travis [name] participated in the discussions, expressed understanding, and voiced agreement with the above plans.  All questions were answered to his satisfaction. he is encouraged to contact clinic should he have any questions or concerns prior to his return visit.    Follow up plan: - Return in about 4 months (around 04/08/2023) for Diabetes F/U with A1c in office, Bring meter and logs, No previsit labs.    Ronny Bacon, [facility] [address] [place], [place] [address] Phone: [phone] Fax: [phone]  12/07/2022, 1:57 PM

## 2022-12-08 ENCOUNTER — Other Ambulatory Visit (HOSPITAL_COMMUNITY): Payer: Self-pay

## 2022-12-08 ENCOUNTER — Other Ambulatory Visit: Payer: Self-pay

## 2022-12-09 ENCOUNTER — Telehealth: Payer: Self-pay | Admitting: Family Medicine

## 2022-12-09 ENCOUNTER — Other Ambulatory Visit: Payer: Self-pay

## 2022-12-09 NOTE — Telephone Encounter (Signed)
Nurses-patient orthopedist sent [place] a message asking is it okay for him to stop the Eliquis 2 days before his orthopedic procedure on his finger.  From our point of view it would be okay for him to stop it 2 days prior to the actual procedure and more than likely they would restart the medicine either the evening after his surgery or the following day  It is important for the patient to know that there is a very small chance of having a stroke off of this medicine but they would not be able to do the surgery if he stays on the medicine so therefore if he desires to have this surgery he will need to follow the above instructions  Please verify and document that he understands the above and desires for [place] to give this permission thank you  Please let me know his response

## 2022-12-09 NOTE — Telephone Encounter (Signed)
Spoke with patient and advised per Dr Lorin Picket orthopedist sent [place] a message asking is it okay for him to stop the Eliquis 2 days before his orthopedic procedure on his finger.   From our point of view it would be okay for him to stop it 2 days prior to the actual procedure and more than likely they would restart the medicine either the evening after his surgery or the following day   It is important for the patient to know that there is a very small chance of having a stroke off of this medicine but they would not be able to do the surgery if he stays on the medicine so therefore if he desires to have this surgery he will need to follow the above instructions   Please verify and document that he understands the above and desires for [place] to give this permission thank you  Patient gives his permission to message orthopedist and patient verbalized understanding. Patient asked if information for Eliquis be sent via MyChart.

## 2022-12-10 ENCOUNTER — Other Ambulatory Visit (HOSPITAL_COMMUNITY): Payer: Self-pay

## 2022-12-10 ENCOUNTER — Other Ambulatory Visit: Payer: Self-pay

## 2022-12-10 NOTE — Telephone Encounter (Signed)
Form was filled out ready to be sent to specialist office

## 2022-12-13 ENCOUNTER — Other Ambulatory Visit: Payer: Self-pay

## 2022-12-15 ENCOUNTER — Encounter (HOSPITAL_BASED_OUTPATIENT_CLINIC_OR_DEPARTMENT_OTHER): Payer: Self-pay | Admitting: Orthopaedic Surgery

## 2022-12-15 ENCOUNTER — Other Ambulatory Visit: Payer: Self-pay

## 2022-12-15 ENCOUNTER — Encounter (HOSPITAL_BASED_OUTPATIENT_CLINIC_OR_DEPARTMENT_OTHER)
Admission: RE | Admit: 2022-12-15 | Discharge: 2022-12-15 | Disposition: A | Discharge status: Home / Self Care | Payer: 59 | Source: Ambulatory Visit | Attending: Orthopaedic Surgery | Admitting: Orthopaedic Surgery

## 2022-12-15 NOTE — Progress Notes (Signed)

## 2022-12-16 ENCOUNTER — Encounter (HOSPITAL_BASED_OUTPATIENT_CLINIC_OR_DEPARTMENT_OTHER)
Admission: RE | Admit: 2022-12-16 | Discharge: 2022-12-16 | Disposition: A | Discharge status: Home / Self Care | Payer: 59 | Source: Ambulatory Visit | Attending: Orthopaedic Surgery | Admitting: Orthopaedic Surgery

## 2022-12-16 DIAGNOSIS — E1159 Type 2 diabetes mellitus with other circulatory complications: Secondary | ICD-10-CM | POA: Insufficient documentation

## 2022-12-16 DIAGNOSIS — Z01812 Encounter for preprocedural laboratory examination: Secondary | ICD-10-CM | POA: Insufficient documentation

## 2022-12-16 LAB — BASIC METABOLIC PANEL
Anion gap: 8 (ref 5–15)
BUN: 12 mg/dL (ref 8–23)
CO2: 26 mmol/L (ref 22–32)
Calcium: 8.8 mg/dL — ABNORMAL LOW (ref 8.9–10.3)
Chloride: 105 mmol/L (ref 98–111)
Creatinine, Ser: 0.93 mg/dL (ref 0.61–1.24)
GFR, Estimated: >60 mL/min (ref >60)
Glucose, Bld: 240 mg/dL — ABNORMAL HIGH (ref 70–99)
Potassium: 3.8 mmol/L (ref 3.5–5.1)
Sodium: 139 mmol/L (ref 135–145)

## 2022-12-17 ENCOUNTER — Other Ambulatory Visit: Payer: Self-pay

## 2022-12-21 ENCOUNTER — Other Ambulatory Visit: Payer: Self-pay

## 2022-12-21 ENCOUNTER — Other Ambulatory Visit (HOSPITAL_COMMUNITY): Payer: Self-pay

## 2022-12-21 ENCOUNTER — Encounter: Payer: Self-pay | Admitting: Pharmacist

## 2022-12-22 ENCOUNTER — Other Ambulatory Visit (HOSPITAL_COMMUNITY): Payer: Self-pay

## 2022-12-22 ENCOUNTER — Encounter (HOSPITAL_BASED_OUTPATIENT_CLINIC_OR_DEPARTMENT_OTHER): Payer: Self-pay | Admitting: Orthopaedic Surgery

## 2022-12-22 ENCOUNTER — Other Ambulatory Visit: Payer: Self-pay

## 2022-12-22 ENCOUNTER — Ambulatory Visit (HOSPITAL_BASED_OUTPATIENT_CLINIC_OR_DEPARTMENT_OTHER): Payer: 59 | Admitting: Certified Registered Nurse Anesthetist

## 2022-12-22 ENCOUNTER — Ambulatory Visit (HOSPITAL_BASED_OUTPATIENT_CLINIC_OR_DEPARTMENT_OTHER)
Admission: RE | Admit: 2022-12-22 | Discharge: 2022-12-22 | Disposition: A | Discharge status: Home / Self Care | Payer: 59 | Attending: Orthopaedic Surgery | Admitting: Orthopaedic Surgery

## 2022-12-22 ENCOUNTER — Encounter (HOSPITAL_COMMUNITY): Payer: 59 | Admitting: Occupational Therapy

## 2022-12-22 ENCOUNTER — Encounter (HOSPITAL_BASED_OUTPATIENT_CLINIC_OR_DEPARTMENT_OTHER)
Admission: RE | Disposition: A | Discharge status: Home / Self Care | Payer: Self-pay | Source: Home / Self Care | Attending: Orthopaedic Surgery

## 2022-12-22 DIAGNOSIS — Z79899 Other long term (current) drug therapy: Secondary | ICD-10-CM | POA: Diagnosis not present

## 2022-12-22 DIAGNOSIS — Z7984 Long term (current) use of oral hypoglycemic drugs: Secondary | ICD-10-CM | POA: Insufficient documentation

## 2022-12-22 DIAGNOSIS — Z8673 Personal history of transient ischemic attack (TIA), and cerebral infarction without residual deficits: Secondary | ICD-10-CM | POA: Diagnosis not present

## 2022-12-22 DIAGNOSIS — Z833 Family history of diabetes mellitus: Secondary | ICD-10-CM | POA: Diagnosis not present

## 2022-12-22 DIAGNOSIS — M1990 Unspecified osteoarthritis, unspecified site: Secondary | ICD-10-CM | POA: Insufficient documentation

## 2022-12-22 DIAGNOSIS — M65332 Trigger finger, left middle finger: Secondary | ICD-10-CM

## 2022-12-22 DIAGNOSIS — I4891 Unspecified atrial fibrillation: Secondary | ICD-10-CM | POA: Diagnosis not present

## 2022-12-22 DIAGNOSIS — Z823 Family history of stroke: Secondary | ICD-10-CM | POA: Diagnosis not present

## 2022-12-22 DIAGNOSIS — G4730 Sleep apnea, unspecified: Secondary | ICD-10-CM | POA: Insufficient documentation

## 2022-12-22 DIAGNOSIS — F418 Other specified anxiety disorders: Secondary | ICD-10-CM | POA: Diagnosis not present

## 2022-12-22 DIAGNOSIS — E1159 Type 2 diabetes mellitus with other circulatory complications: Secondary | ICD-10-CM

## 2022-12-22 DIAGNOSIS — Z794 Long term (current) use of insulin: Secondary | ICD-10-CM | POA: Diagnosis not present

## 2022-12-22 DIAGNOSIS — G4733 Obstructive sleep apnea (adult) (pediatric): Secondary | ICD-10-CM | POA: Diagnosis not present

## 2022-12-22 DIAGNOSIS — M65842 Other synovitis and tenosynovitis, left hand: Secondary | ICD-10-CM | POA: Insufficient documentation

## 2022-12-22 DIAGNOSIS — E119 Type 2 diabetes mellitus without complications: Secondary | ICD-10-CM | POA: Insufficient documentation

## 2022-12-22 DIAGNOSIS — Z8249 Family history of ischemic heart disease and other diseases of the circulatory system: Secondary | ICD-10-CM | POA: Insufficient documentation

## 2022-12-22 DIAGNOSIS — I10 Essential (primary) hypertension: Secondary | ICD-10-CM

## 2022-12-22 DIAGNOSIS — Z7901 Long term (current) use of anticoagulants: Secondary | ICD-10-CM | POA: Insufficient documentation

## 2022-12-22 DIAGNOSIS — Z87891 Personal history of nicotine dependence: Secondary | ICD-10-CM | POA: Insufficient documentation

## 2022-12-22 DIAGNOSIS — I480 Paroxysmal atrial fibrillation: Secondary | ICD-10-CM | POA: Diagnosis not present

## 2022-12-22 DIAGNOSIS — E785 Hyperlipidemia, unspecified: Secondary | ICD-10-CM

## 2022-12-22 HISTORY — PX: TRIGGER FINGER RELEASE: SHX641

## 2022-12-22 LAB — GLUCOSE, CAPILLARY
Glucose-Capillary: 179 mg/dL — ABNORMAL HIGH (ref 70–99)
Glucose-Capillary: 157 mg/dL — ABNORMAL HIGH (ref 70–99)

## 2022-12-22 SURGERY — RELEASE, A1 PULLEY, FOR TRIGGER FINGER
Anesthesia: Monitor Anesthesia Care | Site: Middle Finger | Laterality: Left

## 2022-12-22 MED ORDER — LACTATED RINGERS IV SOLN: INTRAVENOUS | Status: DC

## 2022-12-22 MED ORDER — MIDAZOLAM HCL 5 MG/5ML IJ SOLN
INTRAMUSCULAR | Status: DC | PRN
  Administered 2022-12-22: 2 mg via INTRAVENOUS

## 2022-12-22 MED ORDER — 0.9 % SODIUM CHLORIDE (POUR BTL) OPTIME
TOPICAL | Status: DC | PRN
  Administered 2022-12-22: 1000 mL

## 2022-12-22 MED ORDER — FENTANYL CITRATE (PF) 100 MCG/2ML IJ SOLN
INTRAMUSCULAR | Status: AC
  Filled 2022-12-22: qty 2

## 2022-12-22 MED ORDER — GLYCOPYRROLATE 0.2 MG/ML IJ SOLN
INTRAMUSCULAR | Status: DC | PRN
Start: 2022-12-22 — End: 2022-12-22
  Administered 2022-12-22: .2 mg via INTRAVENOUS

## 2022-12-22 MED ORDER — BUPIVACAINE HCL (PF) 0.25 % IJ SOLN
INTRAMUSCULAR | Status: AC
  Filled 2022-12-22: qty 60

## 2022-12-22 MED ORDER — OXYCODONE HCL 5 MG/5ML PO SOLN: 5.0000 mg | Freq: Once | ORAL | Status: DC | PRN

## 2022-12-22 MED ORDER — CEFAZOLIN SODIUM-DEXTROSE 2-4 GM/100ML-% IV SOLN
2.0000 g | INTRAVENOUS | Status: AC
  Administered 2022-12-22: 2 g via INTRAVENOUS

## 2022-12-22 MED ORDER — ONDANSETRON HCL 4 MG/2ML IJ SOLN
INTRAMUSCULAR | Status: DC | PRN
Start: 2022-12-22 — End: 2022-12-22
  Administered 2022-12-22: 4 mg via INTRAVENOUS

## 2022-12-22 MED ORDER — BUPIVACAINE HCL (PF) 0.5 % IJ SOLN
INTRAMUSCULAR | Status: AC
  Filled 2022-12-22: qty 30

## 2022-12-22 MED ORDER — CEFAZOLIN SODIUM-DEXTROSE 2-4 GM/100ML-% IV SOLN
INTRAVENOUS | Status: AC
  Filled 2022-12-22: qty 100

## 2022-12-22 MED ORDER — LIDOCAINE-EPINEPHRINE (PF) 1 %-1:200000 IJ SOLN
INTRAMUSCULAR | Status: DC | PRN
  Administered 2022-12-22: 8 mL via SUBCUTANEOUS

## 2022-12-22 MED ORDER — LIDOCAINE-EPINEPHRINE (PF) 1 %-1:200000 IJ SOLN
INTRAMUSCULAR | Status: AC
  Filled 2022-12-22: qty 30

## 2022-12-22 MED ORDER — PROPOFOL 500 MG/50ML IV EMUL
INTRAVENOUS | Status: DC | PRN
  Administered 2022-12-22: 125 ug/kg/min via INTRAVENOUS

## 2022-12-22 MED ORDER — HYDROCODONE-ACETAMINOPHEN 5-325 MG PO TABS
1.0000 | ORAL_TABLET | Freq: Four times a day (QID) | ORAL | 0 refills | Status: DC | PRN
  Filled 2022-12-22: qty 10, 3d supply, fill #0

## 2022-12-22 MED ORDER — PROPOFOL 10 MG/ML IV BOLUS
INTRAVENOUS | Status: DC | PRN
  Administered 2022-12-22: 50 mg via INTRAVENOUS

## 2022-12-22 MED ORDER — MIDAZOLAM HCL 2 MG/2ML IJ SOLN
INTRAMUSCULAR | Status: AC
  Filled 2022-12-22: qty 2

## 2022-12-22 MED ORDER — PROMETHAZINE HCL 25 MG/ML IJ SOLN: 6.2500 mg | INTRAMUSCULAR | Status: DC | PRN

## 2022-12-22 MED ORDER — HYDROMORPHONE HCL 1 MG/ML IJ SOLN: 0.2500 mg | INTRAMUSCULAR | Status: DC | PRN

## 2022-12-22 MED ORDER — OXYCODONE HCL 5 MG PO TABS: 5.0000 mg | ORAL_TABLET | Freq: Once | ORAL | Status: DC | PRN

## 2022-12-22 MED ORDER — FENTANYL CITRATE (PF) 250 MCG/5ML IJ SOLN
INTRAMUSCULAR | Status: DC | PRN
  Administered 2022-12-22: 50 ug via INTRAVENOUS

## 2022-12-22 MED ORDER — LIDOCAINE HCL (PF) 1 % IJ SOLN
INTRAMUSCULAR | Status: AC
  Filled 2022-12-22: qty 30

## 2022-12-22 SURGICAL SUPPLY — 46 items
BAND INSRT 18 STRL LF DISP RB (MISCELLANEOUS) ×2
BAND RUBBER #18 3X1/16 STRL (MISCELLANEOUS) ×2 IMPLANT
BLADE SURG 15 STRL LF DISP TIS (BLADE) ×1 IMPLANT
BLADE SURG 15 STRL SS (BLADE) ×1
BNDG CMPR 5X3 KNIT ELC UNQ LF (GAUZE/BANDAGES/DRESSINGS) ×1
BNDG CMPR 9X4 STRL LF SNTH (GAUZE/BANDAGES/DRESSINGS) ×1
BNDG ELASTIC 3INX 5YD STR LF (GAUZE/BANDAGES/DRESSINGS) ×1 IMPLANT
BNDG ESMARK 4X9 LF (GAUZE/BANDAGES/DRESSINGS) IMPLANT
BRUSH SCRUB EZ PLAIN DRY (MISCELLANEOUS) ×1 IMPLANT
CANISTER SUCT 1200ML W/VALVE (MISCELLANEOUS) ×1 IMPLANT
CORD BIPOLAR FORCEPS 12FT (ELECTRODE) ×1 IMPLANT
COVER BACK TABLE 60X90IN (DRAPES) ×1 IMPLANT
COVER MAYO STAND STRL (DRAPES) ×1 IMPLANT
CUFF TOURN SGL QUICK 18X4 (TOURNIQUET CUFF) ×1 IMPLANT
DRAPE EXTREMITY T 121X128X90 (DISPOSABLE) ×1 IMPLANT
DRAPE SURG 17X23 STRL (DRAPES) ×1 IMPLANT
GAUZE SPONGE 4X4 12PLY STRL (GAUZE/BANDAGES/DRESSINGS) ×1 IMPLANT
GAUZE XEROFORM 1X8 LF (GAUZE/BANDAGES/DRESSINGS) ×1 IMPLANT
GLOVE BIOGEL PI IND STRL 7.0 (GLOVE) IMPLANT
GLOVE BIOGEL PI IND STRL 7.5 (GLOVE) ×1 IMPLANT
GLOVE ECLIPSE 6.5 STRL STRAW (GLOVE) IMPLANT
GLOVE ECLIPSE 7.0 STRL STRAW (GLOVE) ×1 IMPLANT
GLOVE INDICATOR 7.0 STRL GRN (GLOVE) ×1 IMPLANT
GLOVE SURG SYN 7.5 E (GLOVE) ×1
GLOVE SURG SYN 7.5 PF PI (GLOVE) ×1 IMPLANT
GLOVE SURG SYN 8.0 (GLOVE) ×1
GLOVE SURG SYN 8.0 PF PI (GLOVE) IMPLANT
GOWN STRL REUS W/ TWL XL LVL3 (GOWN DISPOSABLE) ×1 IMPLANT
GOWN STRL REUS W/TWL XL LVL3 (GOWN DISPOSABLE) ×1
GOWN STRL SURGICAL XL XLNG (GOWN DISPOSABLE) ×1 IMPLANT
NDL HYPO 25X1 1.5 SAFETY (NEEDLE) ×1 IMPLANT
NEEDLE HYPO 25X1 1.5 SAFETY (NEEDLE) ×1
NS IRRIG 1000ML POUR BTL (IV SOLUTION) ×1 IMPLANT
PACK BASIN DAY SURGERY FS (CUSTOM PROCEDURE TRAY) ×1 IMPLANT
PAD CAST 3X4 CTTN HI CHSV (CAST SUPPLIES) ×1 IMPLANT
PADDING CAST COTTON 3X4 STRL (CAST SUPPLIES) ×1
SHEET MEDIUM DRAPE 40X70 STRL (DRAPES) ×1 IMPLANT
SPIKE FLUID TRANSFER (MISCELLANEOUS) IMPLANT
STOCKINETTE 4X48 STRL (DRAPES) ×1 IMPLANT
SUT ETHILON 4 0 PS 2 18 (SUTURE) ×1 IMPLANT
SYR BULB EAR ULCER 3OZ GRN STR (SYRINGE) ×1 IMPLANT
SYR CONTROL 10ML LL (SYRINGE) ×1 IMPLANT
TOWEL GREEN STERILE FF (TOWEL DISPOSABLE) ×1 IMPLANT
TRAY DSU PREP LF (CUSTOM PROCEDURE TRAY) ×1 IMPLANT
TUBE CONNECTING 20X1/4 (TUBING) ×1 IMPLANT
UNDERPAD 30X36 HEAVY ABSORB (UNDERPADS AND DIAPERS) ×1 IMPLANT

## 2022-12-22 NOTE — Discharge Instructions (Addendum)
Post Anesthesia Home Care Instructions  Activity: Get plenty of rest for the remainder of the day. A responsible individual must stay with you for 24 hours following the procedure.  For the next 24 hours, DO NOT: -Drive a car -Paediatric nurse -Drink alcoholic beverages -Take any medication unless instructed by your physician -Make any legal decisions or sign important papers.  Meals: Start with liquid foods such as gelatin or soup. Progress to regular foods as tolerated. Avoid greasy, spicy, heavy foods. If nausea and/or vomiting occur, drink only clear liquids until the nausea and/or vomiting subsides. Call your physician if vomiting continues.  Special Instructions/Symptoms: Your throat may feel dry or sore from the anesthesia or the breathing tube placed in your throat during surgery. If this causes discomfort, gargle with warm salt water. The discomfort should disappear within 24 hours.  If you had a scopolamine patch placed behind your ear for the management of post- operative nausea and/or vomiting:  1. The medication in the patch is effective for 72 hours, after which it should be removed.  Wrap patch in a tissue and discard in the trash. Wash hands thoroughly with soap and water. 2. You may remove the patch earlier than 72 hours if you experience unpleasant side effects which may include dry mouth, dizziness or visual disturbances. 3. Avoid touching the patch. Wash your hands with soap and water after contact with the patch.         Postoperative instructions:  Weightbearing instructions: don't lift more than 10 lbs for 4 weeks  Dressing instructions: Keep your dressing and/or splint clean and dry at all times.  It will be removed at your first post-operative appointment.  Your stitches and/or staples will be removed at this visit.  Incision instructions:  Do not soak your incision for 3 weeks after surgery.  If the incision gets wet, pat dry and do not scrub the  incision.  Pain control:  You have been given a prescription to be taken as directed for post-operative pain control.  In addition, elevate the operative extremity above the heart at all times to prevent swelling and throbbing pain.  Take over-the-counter Colace, 100mg  by mouth twice a day while taking narcotic pain medications to help prevent constipation.  Follow up appointments: 1) 10 days for suture removal and wound check. 2) Dr. Erlinda Hong as scheduled.   -------------------------------------------------------------------------------------------------------------  After Surgery Pain Control:  After your surgery, post-surgical discomfort or pain is likely. This discomfort can last several days to a few weeks. At certain times of the day your discomfort may be more intense.  Did you receive a nerve block?  A nerve block can provide pain relief for one hour to two days after your surgery. As long as the nerve block is working, you will experience little or no sensation in the area the surgeon operated on.  As the nerve block wears off, you will begin to experience pain or discomfort. It is very important that you begin taking your prescribed pain medication before the nerve block fully wears off. Treating your pain at the first sign of the block wearing off will ensure your pain is better controlled and more tolerable when full-sensation returns. Do not wait until the pain is intolerable, as the medicine will be less effective. It is better to treat pain in advance than to try and catch up.  General Anesthesia:  If you did not receive a nerve block during your surgery, you will need to start taking your pain  medication shortly after your surgery and should continue to do so as prescribed by your surgeon.  Pain Medication:  Most commonly we prescribe Vicodin and Percocet for post-operative pain. Both of these medications contain a combination of acetaminophen (Tylenol) and a narcotic to help control  pain.   It takes between 30 and 45 minutes before pain medication starts to work. It is important to take your medication before your pain level gets too intense.   Nausea is a common side effect of many pain medications. You will want to eat something before taking your pain medicine to help prevent nausea.   If you are taking a prescription pain medication that contains acetaminophen, we recommend that you do not take additional over the counter acetaminophen (Tylenol).  Other pain relieving options:   Using a cold pack to ice the affected area a few times a day (15 to 20 minutes at a time) can help to relieve pain, reduce swelling and bruising.   Elevation of the affected area can also help to reduce pain and swelling.

## 2022-12-22 NOTE — Transfer of Care (Signed)
Immediate Anesthesia Transfer of Care Note  Patient: Travis [name]  Procedure(s) Performed: LEFT A-1 PULLEY RELEASE LONG FINGER (Left: Middle Finger)  Patient Location: PACU  Anesthesia Type:MAC  Level of Consciousness: drowsy  Airway & Oxygen Therapy: Patient Spontanous Breathing and Patient connected to face mask oxygen  Post-op Assessment: Report given to RN and Post -op Vital signs reviewed and stable  Post vital signs: Reviewed and stable  Last Vitals:  Vitals Value Taken Time  BP 141/82   Temp    Pulse 73 12/22/22 1300  Resp    SpO2 97 % 12/22/22 1300  Vitals shown include unfiled device data.  Last Pain:  Vitals:   12/22/22 1026  TempSrc: Temporal  PainSc: 4       Patients Stated Pain Goal: 4 (12/22/22 1026)  Complications: No notable events documented.

## 2022-12-22 NOTE — H&P (Signed)
PREOPERATIVE H&P  Chief Complaint: left long trigger finger  HPI: Travis [name] is a 62 y.o. male who presents for surgical treatment of left long trigger finger.  He denies any changes in medical history.  Past Surgical History:  Procedure Laterality Date   BIOPSY  06/14/2019   Procedure: BIOPSY;  Surgeon: Sherrilyn Rist, MD;  Location: [facility];  Service: Gastroenterology;;   CERVICAL DISC SURGERY     COLON SURGERY  05/03/2001   1/3 removed for diverticulitis   COLONOSCOPY     COLONOSCOPY WITH PROPOFOL N/A 06/14/2019   Procedure: COLONOSCOPY WITH PROPOFOL;  Surgeon: Sherrilyn Rist, MD;  Location: [facility];  Service: Gastroenterology;  Laterality: N/A;   EP IMPLANTABLE DEVICE N/A 05/01/2015   Procedure: Loop Recorder Insertion;  Surgeon: Duke Salvia, MD;  Location: [facility] CV [facility];  Service: Cardiovascular;  Laterality: N/A;   INGUINAL HERNIA REPAIR Bilateral    KNEE ARTHROSCOPY Right    SHOULDER ARTHROSCOPY Left 09/2022   SHOULDER ARTHROSCOPY Left 08/02/2014   Procedure: LEFT SHOULDER SCOPE DEBRIDEMENT/ACROMIOPLASTY;  Surgeon: Teryl Lucy, MD;  Location: [facility];  Service: Orthopedics;  Laterality: Left;  ANESTHESIA: GENERAL, PRE/POST OP SCALENE   TEE WITHOUT CARDIOVERSION N/A 03/03/2015   Procedure: TRANSESOPHAGEAL ECHOCARDIOGRAM (TEE);  Surgeon: Laqueta Linden, MD;  Location: [facility];  Service: Cardiology;  Laterality: N/A;   UMBILICAL HERNIA REPAIR     with other hernia repair with mesh   WRIST SURGERY Right    fusion   Social History   Socioeconomic History   Marital status: Married    Spouse name: Not on file   Number of children: 2   Years of education: College   Highest education level: Some college, no degree  Occupational History   Occupation: disabled  Tobacco Use   Smoking status: Former    Current packs/day: 0.00    Average packs/day: 1 pack/day for 8.0 years (8.0 ttl pk-yrs)    Types: Cigarettes     Start date: 06/07/1970    Quit date: 05/03/1978    Years since quitting: 44.6   Smokeless tobacco: Former    Types: Chew    Quit date: 05/03/1978   Tobacco comments:    Former smoker 07/19/22  Vaping Use   Vaping status: Never Used  Substance and Sexual Activity   Alcohol use: Yes    Alcohol/week: 1.0 standard drink of alcohol    Types: 1 Cans of beer per week    Comment: 1 beer every 6 months h/o heavy use in the past 07/19/22   Drug use: No   Sexual activity: Yes  Other Topics Concern   Not on file  Social History Narrative   Drinks some coffee, Drink diet sodas and tea   Social Determinants of Health   Financial Resource Strain: Low Risk  (10/10/2022)   Overall Financial Resource Strain (CARDIA)    Difficulty of Paying Living Expenses: Not hard at all  Food Insecurity: No Food Insecurity (10/10/2022)   Hunger Vital Sign    Worried About Running Out of Food in the Last Year: Never true    Ran Out of Food in the Last Year: Never true  Transportation Needs: No Transportation Needs (10/10/2022)   PRAPARE - Administrator, Civil Service (Medical): No    Lack of Transportation (Non-Medical): No  Physical Activity: Unknown (10/10/2022)   Exercise Vital Sign    Days of Exercise per Week: 0 days  Minutes of Exercise per Session: Not on file  Stress: No Stress Concern Present (10/10/2022)   [employer] of Occupational Health - Occupational Stress Questionnaire    Feeling of Stress : Only a little  Social Connections: Moderately Integrated (10/10/2022)   Social Connection and Isolation Panel [NHANES]    Frequency of Communication with Friends and Family: More than three times a week    Frequency of Social Gatherings with Friends and Family: Three times a week    Attends Religious Services: 1 to 4 times per year    Active Member of Clubs or Organizations: No    Attends Engineer, structural: Not on file    Marital Status: Married   Family History  Problem  Relation Age of Onset   Stroke Father    Hyperlipidemia Father    Heart attack Sister 40   Stroke Sister    Dementia Mother    ALS Brother        age 68   Diabetes Maternal Grandfather    Colon cancer Neg Hx    Esophageal cancer Neg Hx    Stomach cancer Neg Hx    Rectal cancer Neg Hx    Allergies  Allergen Reactions   Lisinopril Cough    Patient/spouse is not aware/familiar with why this is listed as an allergy Not a true allergy but on this list to avoid ACE inhibitors-SA Luking MD   Prior to Admission medications   Medication Sig Start Date End Date Taking? Authorizing Provider  Accu-Chek FastClix Lancets MISC Use to check blood sugar twice daily. 06/25/22 06/25/23 Yes Reardon, Freddi Starr, NP  ALPRAZolam Prudy Feeler) 0.25 MG tablet Take 0.5 tablets (0.125 mg total) by mouth in the morning AND 1 tablet (0.25 mg total) daily in the afternoon AND 1 tablet (0.25 mg total) every evening. Take as needed 10/12/22  Yes Luking, Jonna Coup, MD  apixaban (ELIQUIS) 5 MG TABS tablet Take 1 tablet (5 mg total) by mouth 2 (two) times daily. 07/19/22  Yes Fenton, Clint R, PA  atorvastatin (LIPITOR) 20 MG tablet Take 1 tablet (20 mg total) by mouth daily. 11/30/22  Yes Babs Sciara, MD  Blood Glucose Monitoring Suppl (FREESTYLE LITE) w/Device KIT Use to check glucose twice daily 06/29/22  Yes Dani Gobble, NP  Cholecalciferol 50 MCG (2000 UT) CAPS Take 1 capsule (2,000 Units total) by mouth daily with breakfast. 05/17/18  Yes Nida, Denman George, MD  Continuous Glucose Sensor (DEXCOM G7 SENSOR) MISC Inject into the skin as directed to check continuous blood sugar. Change sensor every 10 days as directed. 12/07/22  Yes Dani Gobble, NP  FLUoxetine (PROZAC) 20 MG capsule Take 1 capsule by mouth daily. 11/30/22  Yes Luking, Jonna Coup, MD  fluticasone (FLONASE) 50 MCG/ACT nasal spray Place 1 spray into both nostrils daily. 04/09/22  Yes Babs Sciara, MD  folic acid (FOLVITE) 1 MG tablet Take 1 tablet (1  mg) by mouth daily. 11/22/22 02/20/23 Yes Luking, Jonna Coup, MD  gabapentin (NEURONTIN) 300 MG capsule Take 1 capsule (300 mg total) by mouth every morning AND 1 capsule (300 mg total) daily after lunch AND 2 capsules (600 mg total) at bedtime. 02/03/22 02/05/23 Yes McCue, Shanda Bumps, NP  glucose blood (ACCU-CHEK GUIDE) test strip USE AS DIRECTED TO TEST BLOOD SUGAR 2 TIMES DAILY AS DIRECTED 06/25/22 06/25/23 Yes Reardon, Freddi Starr, NP  insulin degludec (TRESIBA FLEXTOUCH) 100 UNIT/ML FlexTouch Pen Inject 30 Units into the skin at bedtime. 12/07/22  Yes  Dani Gobble, NP  Insulin Pen Needle (COMFORT EZ PEN NEEDLES) 31G X 6 MM MISC USE TO INJECT INSULIN DAILY AS DIRECTED 06/28/22  Yes Dani Gobble, NP  Insulin Pen Needle (UNIFINE PENTIPS) 31G X 6 MM MISC USE TO INJECT INSULIN DAILY AS DIRECTED 04/01/22  Yes Dani Gobble, NP  metFORMIN (GLUCOPHAGE) 500 MG tablet Take 1 tablet (500 mg total) by mouth 2 (two) times daily with a meal. 12/07/22  Yes Reardon, Freddi Starr, NP  Multiple Vitamin (MULTIVITAMIN WITH MINERALS) TABS tablet Take 1 tablet daily by mouth.   Yes [provider]  valACYclovir (VALTREX) 1000 MG tablet Take 1 tablet by mouth daily. 10/12/22  Yes Babs Sciara, MD  Blood Pressure Monitoring (OMRON 3 SERIES BP MONITOR) DEVI USE AS DIRECTED. 11/28/20   Andrez Grime, RPH  COVID-19 mRNA vaccine [phone] (COMIRNATY) syringe Inject into the muscle. 05/07/22   Judyann Munson, MD  methocarbamol (ROBAXIN) 750 MG tablet Take 1 tablet (750 mg total) by mouth 2 (two) times daily as needed for muscle spasms. 09/01/22   Tarry Kos, MD  ondansetron (ZOFRAN) 4 MG tablet Take 1-2 tablets (4-8 mg total) by mouth every 8 (eight) hours as needed for nausea or vomiting. 09/01/22   Tarry Kos, MD  RSV vaccine recomb adjuvanted (AREXVY) 120 MCG/0.5ML injection Inject into the muscle. 05/18/22     Insulin Glargine (BASAGLAR KWIKPEN) 100 UNIT/ML INJECT 50 UNITS INTO THE SKIN AT BEDTIME 06/20/20 07/15/20   Dani Gobble, NP     Positive ROS: All other systems have been reviewed and were otherwise negative with the exception of those mentioned in the HPI and as above.  Physical Exam: General: Alert, no acute distress Cardiovascular: No pedal edema Respiratory: No cyanosis, no use of accessory musculature GI: abdomen soft Skin: No lesions in the area of chief complaint Neurologic: Sensation intact distally Psychiatric: Patient is competent for consent with normal mood and affect Lymphatic: no lymphedema  MUSCULOSKELETAL: exam stable  Assessment: left long trigger finger  Plan: Plan for Procedure(s): LEFT A-1 PULLEY RELEASE LONG FINGER  The risks benefits and alternatives were discussed with the patient including but not limited to the risks of nonoperative treatment, versus surgical intervention including infection, bleeding, nerve injury,  blood clots, cardiopulmonary complications, morbidity, mortality, among others, and they were willing to proceed.   Glee Arvin, MD 12/22/2022 11:28 AM

## 2022-12-22 NOTE — Anesthesia Preprocedure Evaluation (Signed)
Anesthesia Evaluation  Patient identified by MRN, date of birth, ID band Patient awake    Reviewed: Allergy & Precautions, NPO status , Patient's Chart, lab work & pertinent test results  Airway Mallampati: II  TM Distance: >3 FB Neck ROM: Full    Dental no notable dental hx. (+) Teeth Intact, Dental Advisory Given   Pulmonary sleep apnea , former smoker   Pulmonary exam normal breath sounds clear to auscultation       Cardiovascular hypertension, Pt. on medications Normal cardiovascular exam+ dysrhythmias Atrial Fibrillation  Rhythm:Regular Rate:Normal     Neuro/Psych   Anxiety Depression    CVA, No Residual Symptoms    GI/Hepatic negative GI ROS, Neg liver ROS,,,  Endo/Other  diabetes, Well Controlled, Type 2, Insulin Dependent, Oral Hypoglycemic Agents    Renal/GU      Musculoskeletal  (+) Arthritis , Osteoarthritis,    Abdominal   Peds  Hematology   Anesthesia Other Findings All: lisonopril  Reproductive/Obstetrics                             Anesthesia Physical Anesthesia Plan  ASA: 3  Anesthesia Plan: MAC   Post-op Pain Management: Minimal or no pain anticipated   Induction: Intravenous  PONV Risk Score and Plan: 2 and Midazolam, Treatment may vary due to age or medical condition and Ondansetron  Airway Management Planned: Simple Face Mask  Additional Equipment: None  Intra-op Plan:   Post-operative Plan:   Informed Consent: I have reviewed the patients History and Physical, chart, labs and discussed the procedure including the risks, benefits and alternatives for the proposed anesthesia with the patient or authorized representative who has indicated his/her understanding and acceptance.     Dental advisory given  Plan Discussed with: CRNA  Anesthesia Plan Comments:        Anesthesia Quick Evaluation

## 2022-12-22 NOTE — Op Note (Signed)
   Date of Surgery: 12/22/2022  INDICATIONS: Travis [name] is a 62 y.o.-year-old male who presents for surgical treatment of stenosing tenosynovitis of left middle finger ;  The patient did consent to the procedure after discussion of the risks and benefits.  PREOPERATIVE DIAGNOSIS: Stenosing tenosynovitis left middle finger  POSTOPERATIVE DIAGNOSIS: Same.  PROCEDURE: Tenolysis of left middle finger flexor tendon with release of A1 pulley  SURGEON: Jerad Dunlap Glee Arvin, M.D.  ASSIST: None  ANESTHESIA:  local and MAC  IV FLUIDS AND URINE: See anesthesia.  ESTIMATED BLOOD LOSS: minimal mL  COMPLICATIONS: None.  DESCRIPTION OF PROCEDURE: The patient was identified in the preoperative holding area. The operative site was marked by the surgeon confirmed with the patient. He is brought back to the operating room. The patient was placed supine on table. A nonsterile tourniquet was placed on the arm. Local anesthetic was placed in the planned operative site. The operative extremity was prepped and draped in standard sterile fashion. Timeout was performed. Antibiotics were given. Timeout was performed.  Tourniquet was inflated to 250 mmHg.  A vertical incision was made over the metacarpal head of the left middle finger. Blunt dissection was taken down to the level of the flexor tendon. The neurovascular bundles were identified on each side of the tendon sheath and protected.  The proximal edge of the A1 pulley was identified. This was sharply incised. Tenolysis of the flexor tendon was performed with tenotomy scissors. Care was taken not to disrupt the A2 pulley. The palmar pulley was then visualized and released also. The tourniquet was then deflated and hemostasis was obtained.  The wound was thoroughly irrigated and closed with 3-0 nylon sutures. Sterile dressings were applied and the hand was placed in a soft dressing. Patient tolerated the procedure well and was taken to the PACU in stable  condition.  POSTOPERATIVE PLAN: Patient will be weight bearing as tolerated and to avoid heavy lifting for 4 weeks.    Travis Reel, MD 12:57 PM

## 2022-12-22 NOTE — Anesthesia Postprocedure Evaluation (Signed)
Anesthesia Post Note  Patient: Travis [name]  Procedure(s) Performed: LEFT A-1 PULLEY RELEASE LONG FINGER (Left: Middle Finger)     Patient location during evaluation: PACU Anesthesia Type: MAC Level of consciousness: awake and alert Pain management: pain level controlled Vital Signs Assessment: post-procedure vital signs reviewed and stable Respiratory status: spontaneous breathing, nonlabored ventilation and respiratory function stable Cardiovascular status: blood pressure returned to baseline and stable Postop Assessment: no apparent nausea or vomiting Anesthetic complications: no   No notable events documented.  Last Vitals:  Vitals:   12/22/22 1330 12/22/22 1406  BP: (!) 129/90 (!) 147/95  Pulse: 66 61  Resp: 14 16  Temp:  (!) 36.4 C  SpO2: 95% 95%    Last Pain:  Vitals:   12/22/22 1406  TempSrc:   PainSc: 0-No pain                 Lowella Curb

## 2022-12-22 NOTE — Anesthesia Procedure Notes (Signed)
Procedure Name: MAC Date/Time: 12/22/2022 12:35 PM  Performed by: Demetrio Lapping, CRNAPre-anesthesia Checklist: Patient identified, Emergency Drugs available, Suction available, Patient being monitored and Timeout performed Patient Re-evaluated:Patient Re-evaluated prior to induction Oxygen Delivery Method: Simple face mask Placement Confirmation: positive ETCO2 Dental Injury: Teeth and Oropharynx as per pre-operative assessment

## 2022-12-23 ENCOUNTER — Encounter (HOSPITAL_BASED_OUTPATIENT_CLINIC_OR_DEPARTMENT_OTHER): Payer: Self-pay | Admitting: Orthopaedic Surgery

## 2022-12-23 ENCOUNTER — Encounter (HOSPITAL_COMMUNITY): Payer: 59 | Admitting: Occupational Therapy

## 2022-12-23 ENCOUNTER — Telehealth (HOSPITAL_COMMUNITY): Payer: Self-pay | Admitting: Occupational Therapy

## 2022-12-23 NOTE — Telephone Encounter (Signed)
Called pt regarding no-show for 8/22 appt. Pt reports he had surgery on his hand yesterday and cannot do therapy right now. Requested to cancel next appt on 8/29 and will call back when ready to resume therapy.    Ezra Sites, OTR/L  [phone] 12/23/22

## 2022-12-24 ENCOUNTER — Other Ambulatory Visit: Payer: Self-pay

## 2022-12-28 DIAGNOSIS — D485 Neoplasm of uncertain behavior of skin: Secondary | ICD-10-CM | POA: Diagnosis not present

## 2022-12-30 ENCOUNTER — Encounter (HOSPITAL_COMMUNITY): Payer: 59 | Admitting: Occupational Therapy

## 2022-12-31 ENCOUNTER — Ambulatory Visit: Payer: 59 | Admitting: Physician Assistant

## 2022-12-31 ENCOUNTER — Other Ambulatory Visit: Payer: Self-pay

## 2022-12-31 DIAGNOSIS — M65332 Trigger finger, left middle finger: Secondary | ICD-10-CM

## 2022-12-31 MED ORDER — HYDROCODONE-ACETAMINOPHEN 5-325 MG PO TABS
1.0000 | ORAL_TABLET | Freq: Three times a day (TID) | ORAL | 0 refills | Status: DC | PRN
  Filled 2022-12-31: qty 10, 4d supply, fill #0

## 2022-12-31 NOTE — Progress Notes (Signed)
Post-Op Visit Note   Patient: Travis [name]           Date of Birth: 1960-11-01           MRN: [id number] Visit Date: 12/31/2022 PCP: Babs Sciara, MD   Assessment & Plan:  Chief Complaint:  Chief Complaint  Patient presents with   Left Hand - Routine Post Op   Visit Diagnoses:  1. Trigger finger, left middle finger     Plan: Patient is a pleasant 62 year old gentleman who comes in today 1 week status post left long trigger finger release 12/22/2022.  He has been doing well.  He has had some pain to the left hand.  He tells me he removed his bandage a few days ago.  Examination of the left hand reveals a well healing surgical incision with nylon sutures in place.  No evidence of infection or cellulitis.  Fingers are warm well-perfused.  He is neurovascular intact distally.  Today, his wound was cleaned and recovered.  No submerging his hand in water or any heavy lifting.  He will follow-up with [place] next week for suture removal.  Call with concerns or questions in the meantime.  Follow-Up Instructions: Return in about 1 week (around 01/07/2023).   Orders:  No orders of the defined types were placed in this encounter.  Meds ordered this encounter  Medications   HYDROcodone-acetaminophen (NORCO) 5-325 MG tablet    Sig: Take 1 tablet by mouth 3 (three) times daily as needed.    Dispense:  10 tablet    Refill:  0    Imaging: No results found.  PMFS History: Patient Active Problem List   Diagnosis Date Noted   Stenosing tenosynovitis of finger of left hand 12/22/2022   Hypercoagulable state due to paroxysmal atrial fibrillation (HCC) 07/19/2022   Nontraumatic incomplete tear of left rotator cuff 07/07/2022   Arthritis of left acromioclavicular joint 07/07/2022   Right groin hernia 03/03/2021   Hyperlipidemia associated with type 2 diabetes mellitus (HCC) 12/25/2020   Tendinopathy of right rotator cuff 09/25/2020   Arthrosis of right acromioclavicular joint 09/25/2020    Adrenal adenoma, left 09/07/2019   Chronic anticoagulation    GI bleed 06/14/2019   Vitamin D deficiency 04/18/2019   History of stroke 04/20/2018   GAD (generalized anxiety disorder) 05/15/2016   Acute bronchiolitis due to respiratory syncytial virus (RSV)    Acute CVA (cerebrovascular accident) (HCC) 05/04/2016   Dizziness 05/02/2016   Anxiety and depression 05/02/2016   Paroxysmal atrial fibrillation (HCC) 03/02/2016   PFO (patent foramen ovale) 07/24/2015   OSA (obstructive sleep apnea) 04/18/2015   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 artery (HCC) 04/18/2015   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   Snoring 01/23/2015   Degeneration of cervical intervertebral disc 01/23/2015   Essential hypertension 12/14/2014   DM type 2 causing vascular disease (HCC) 12/14/2014   Neck pain 12/14/2014   Cerebral infarction, chronic    Multi-infarct state 10/14/2014   Impingement syndrome of left shoulder 08/02/2014   Adhesive capsulitis of left shoulder 08/02/2014   Rectal bleeding 03/13/2014   Diabetes type 2, controlled (HCC) 02/18/2014   Diabetic peripheral neuropathy (HCC) 03/28/2013   Irreducible incisional hernia 11/11/2010   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   Anxiety    Arthritis    Blood transfusion without reported diagnosis    Complication of  anesthesia    bleed after last shoulder-had to stay overnight   Depression    Diabetes mellitus    Diabetic peripheral neuropathy (HCC) 03/28/2013   Diverticulosis    Dizziness    Hernia, inguinal    Hyperlipidemia    Hypertension    Impingement syndrome of left shoulder 08/02/2014   Ischemic colitis (HCC)    Multi-infarct state 10/14/2014   Neuropathy of lower extremity    Night sweats    every once in a while   Sleep apnea    no CPAP   Stroke (HCC) 02/2015   no deficits   Syncope and collapse      Family History  Problem Relation Age of Onset   Stroke Father    Hyperlipidemia Father    Heart attack Sister 75   Stroke Sister    Dementia Mother    ALS Brother        age 32   Diabetes Maternal Grandfather    Colon cancer Neg Hx    Esophageal cancer Neg Hx    Stomach cancer Neg Hx    Rectal cancer Neg Hx     Past Surgical History:  Procedure Laterality Date   BIOPSY  06/14/2019   Procedure: BIOPSY;  Surgeon: Sherrilyn Rist, MD;  Location: [facility];  Service: Gastroenterology;;   CERVICAL DISC SURGERY     COLON SURGERY  05/03/2001   1/3 removed for diverticulitis   COLONOSCOPY     COLONOSCOPY WITH PROPOFOL N/A 06/14/2019   Procedure: COLONOSCOPY WITH PROPOFOL;  Surgeon: Sherrilyn Rist, MD;  Location: [facility];  Service: Gastroenterology;  Laterality: N/A;   EP IMPLANTABLE DEVICE N/A 05/01/2015   Procedure: Loop Recorder Insertion;  Surgeon: Duke Salvia, MD;  Location: [facility];  Service: Cardiovascular;  Laterality: N/A;   INGUINAL HERNIA REPAIR Bilateral    KNEE ARTHROSCOPY Right    SHOULDER ARTHROSCOPY Left 09/2022   SHOULDER ARTHROSCOPY Left 08/02/2014   Procedure: LEFT SHOULDER SCOPE DEBRIDEMENT/ACROMIOPLASTY;  Surgeon: Teryl Lucy, MD;  Location: [facility];  Service: Orthopedics;  Laterality: Left;  ANESTHESIA: GENERAL, PRE/POST OP SCALENE   TEE WITHOUT CARDIOVERSION N/A 03/03/2015   Procedure: TRANSESOPHAGEAL ECHOCARDIOGRAM (TEE);  Surgeon: Laqueta Linden, MD;  Location: [facility];  Service: Cardiology;  Laterality: N/A;   TRIGGER FINGER RELEASE Left 12/22/2022   Procedure: LEFT A-1 PULLEY RELEASE LONG FINGER;  Surgeon: Tarry Kos, MD;  Location: [facility];  Service: Orthopedics;  Laterality: Left;   UMBILICAL HERNIA REPAIR     with other hernia repair with mesh   WRIST SURGERY Right    fusion   Social History   Occupational History   Occupation: disabled  Tobacco Use   Smoking status:  Former    Current packs/day: 0.00    Average packs/day: 1 pack/day for 8.0 years (8.0 ttl pk-yrs)    Types: Cigarettes    Start date: 06/07/1970    Quit date: 05/03/1978    Years since quitting: 44.6   Smokeless tobacco: Former    Types: Chew    Quit date: 05/03/1978   Tobacco comments:    Former smoker 07/19/22  Vaping Use   Vaping status: Never Used  Substance and Sexual Activity   Alcohol use: Yes    Alcohol/week: 1.0 standard drink of alcohol    Types: 1 Cans of beer per week    Comment: 1 beer every 6 months h/o heavy use in the past 07/19/22  Drug use: No   Sexual activity: Yes

## 2023-01-01 ENCOUNTER — Other Ambulatory Visit (HOSPITAL_COMMUNITY): Payer: Self-pay

## 2023-01-07 ENCOUNTER — Ambulatory Visit (INDEPENDENT_AMBULATORY_CARE_PROVIDER_SITE_OTHER): Payer: 59 | Admitting: Orthopaedic Surgery

## 2023-01-07 DIAGNOSIS — M65332 Trigger finger, left middle finger: Secondary | ICD-10-CM

## 2023-01-07 NOTE — Progress Notes (Signed)
Post-Op Visit Note   Patient: Travis [name]           Date of Birth: 1961-01-28           MRN: [id number] Visit Date: 01/07/2023 PCP: Babs Sciara, MD   Assessment & Plan:  Chief Complaint:  Chief Complaint  Patient presents with   Left Hand - Follow-up   Visit Diagnoses:  1. Trigger finger, left middle finger     Plan: Josiah is 2 weeks status post left middle trigger finger release.  He is doing well overall.  He comes in for suture removal.  Sutures removed Steri-Strips applied.  Increase activity as tolerated.  Follow-up as needed.  Follow-Up Instructions: No follow-ups on file.   Orders:  No orders of the defined types were placed in this encounter.  No orders of the defined types were placed in this encounter.   Imaging: No results found.  PMFS History: Patient Active Problem List   Diagnosis Date Noted   Stenosing tenosynovitis of finger of left hand 12/22/2022   Hypercoagulable state due to paroxysmal atrial fibrillation (HCC) 07/19/2022   Nontraumatic incomplete tear of left rotator cuff 07/07/2022   Arthritis of left acromioclavicular joint 07/07/2022   Right groin hernia 03/03/2021   Hyperlipidemia associated with type 2 diabetes mellitus (HCC) 12/25/2020   Tendinopathy of right rotator cuff 09/25/2020   Arthrosis of right acromioclavicular joint 09/25/2020   Adrenal adenoma, left 09/07/2019   Chronic anticoagulation    GI bleed 06/14/2019   Vitamin D deficiency 04/18/2019   History of stroke 04/20/2018   GAD (generalized anxiety disorder) 05/15/2016   Acute bronchiolitis due to respiratory syncytial virus (RSV)    Acute CVA (cerebrovascular accident) (HCC) 05/04/2016   Dizziness 05/02/2016   Anxiety and depression 05/02/2016   Paroxysmal atrial fibrillation (HCC) 03/02/2016   PFO (patent foramen ovale) 07/24/2015   OSA (obstructive sleep apnea) 04/18/2015   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 artery (HCC) 04/18/2015    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   Snoring 01/23/2015   Degeneration of cervical intervertebral disc 01/23/2015   Essential hypertension 12/14/2014   DM type 2 causing vascular disease (HCC) 12/14/2014   Neck pain 12/14/2014   Cerebral infarction, chronic    Multi-infarct state 10/14/2014   Impingement syndrome of left shoulder 08/02/2014   Adhesive capsulitis of left shoulder 08/02/2014   Rectal bleeding 03/13/2014   Diabetes type 2, controlled (HCC) 02/18/2014   Diabetic peripheral neuropathy (HCC) 03/28/2013   Irreducible incisional hernia 11/11/2010   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   Anxiety    Arthritis    Blood transfusion without reported diagnosis    Complication of anesthesia    bleed after last shoulder-had to stay overnight   Depression    Diabetes mellitus    Diabetic peripheral neuropathy (HCC) 03/28/2013   Diverticulosis    Dizziness    Hernia, inguinal    Hyperlipidemia    Hypertension    Impingement syndrome of left shoulder 08/02/2014   Ischemic colitis (HCC)    Multi-infarct state 10/14/2014   Neuropathy of lower extremity    Night sweats    every once in a while   Sleep apnea    no CPAP   Stroke (HCC) 02/2015   no deficits   Syncope and collapse     Family History  Problem Relation Age of  Onset   Stroke Father    Hyperlipidemia Father    Heart attack Sister 77   Stroke Sister    Dementia Mother    ALS Brother        age 30   Diabetes Maternal Grandfather    Colon cancer Neg Hx    Esophageal cancer Neg Hx    Stomach cancer Neg Hx    Rectal cancer Neg Hx     Past Surgical History:  Procedure Laterality Date   BIOPSY  06/14/2019   Procedure: BIOPSY;  Surgeon: Sherrilyn Rist, MD;  Location: [facility];  Service: Gastroenterology;;   CERVICAL DISC SURGERY     COLON SURGERY  05/03/2001   1/3 removed for diverticulitis    COLONOSCOPY     COLONOSCOPY WITH PROPOFOL N/A 06/14/2019   Procedure: COLONOSCOPY WITH PROPOFOL;  Surgeon: Sherrilyn Rist, MD;  Location: [facility];  Service: Gastroenterology;  Laterality: N/A;   EP IMPLANTABLE DEVICE N/A 05/01/2015   Procedure: Loop Recorder Insertion;  Surgeon: Duke Salvia, MD;  Location: [facility]VAS[facility] CV [facility];  Service: Cardiovascular;  Laterality: N/A;   INGUINAL HERNIA REPAIR Bilateral    KNEE ARTHROSCOPY Right    SHOULDER ARTHROSCOPY Left 09/2022   SHOULDER ARTHROSCOPY Left 08/02/2014   Procedure: LEFT SHOULDER SCOPE DEBRIDEMENT/ACROMIOPLASTY;  Surgeon: Teryl Lucy, MD;  Location: [facility];  Service: Orthopedics;  Laterality: Left;  ANESTHESIA: GENERAL, PRE/POST OP SCALENE   TEE WITHOUT CARDIOVERSION N/A 03/03/2015   Procedure: TRANSESOPHAGEAL ECHOCARDIOGRAM (TEE);  Surgeon: Laqueta Linden, MD;  [facility]: [facility];  Service: Cardiology;  Laterality: N/A;   TRIGGER FINGER RELEASE Left 12/22/2022   Procedure: LEFT A-1 PULLEY RELEASE LONG FINGER;  Surgeon: Tarry Kos, MD;  Location: [facility];  Service: Orthopedics;  Laterality: Left;   UMBILICAL HERNIA REPAIR     with other hernia repair with mesh   WRIST SURGERY Right    fusion   Social History   Occupational History   Occupation: disabled  Tobacco Use   Smoking status: Former    Current packs/day: 0.00    Average packs/day: 1 pack/day for 8.0 years (8.0 ttl pk-yrs)    Types: Cigarettes    Start date: 06/07/1970    Quit date: 05/03/1978    Years since quitting: 44.7   Smokeless tobacco: Former    Types: Chew    Quit date: 05/03/1978   Tobacco comments:    Former smoker 07/19/22  Vaping Use   Vaping status: Never Used  Substance and Sexual Activity   Alcohol use: Yes    Alcohol/week: 1.0 standard drink of alcohol    Types: 1 Cans of beer per week    Comment: 1 beer every 6 months h/o heavy use in the past 07/19/22   Drug use: No   Sexual activity: Yes

## 2023-01-11 ENCOUNTER — Other Ambulatory Visit (HOSPITAL_COMMUNITY): Payer: Self-pay

## 2023-01-15 ENCOUNTER — Other Ambulatory Visit (HOSPITAL_COMMUNITY): Payer: Self-pay

## 2023-01-17 ENCOUNTER — Other Ambulatory Visit: Payer: Self-pay

## 2023-01-17 ENCOUNTER — Other Ambulatory Visit (HOSPITAL_COMMUNITY): Payer: Self-pay

## 2023-01-18 ENCOUNTER — Other Ambulatory Visit: Payer: Self-pay

## 2023-01-22 ENCOUNTER — Other Ambulatory Visit (HOSPITAL_COMMUNITY): Payer: Self-pay

## 2023-01-24 ENCOUNTER — Other Ambulatory Visit (HOSPITAL_COMMUNITY): Payer: Self-pay

## 2023-01-25 ENCOUNTER — Other Ambulatory Visit (HOSPITAL_COMMUNITY): Payer: Self-pay

## 2023-01-25 ENCOUNTER — Ambulatory Visit (INDEPENDENT_AMBULATORY_CARE_PROVIDER_SITE_OTHER): Payer: 59 | Admitting: Orthopaedic Surgery

## 2023-01-25 DIAGNOSIS — M75111 Incomplete rotator cuff tear or rupture of right shoulder, not specified as traumatic: Secondary | ICD-10-CM | POA: Insufficient documentation

## 2023-01-25 DIAGNOSIS — M19011 Primary osteoarthritis, right shoulder: Secondary | ICD-10-CM | POA: Diagnosis not present

## 2023-01-25 DIAGNOSIS — M7541 Impingement syndrome of right shoulder: Secondary | ICD-10-CM | POA: Insufficient documentation

## 2023-01-25 NOTE — Progress Notes (Signed)
Office Visit Note   Patient: Travis [name]           Date of Birth: 20-Aug-1960           MRN: [id number] Visit Date: 01/25/2023              Requested by: Babs Sciara, MD [address] [place],  [place] [address] PCP: Babs Sciara, MD   Assessment & Plan: Visit Diagnoses:  1. Arthritis of right acromioclavicular joint   2. Impingement syndrome of right shoulder   3. Nontraumatic incomplete tear of right rotator cuff     Plan: Impression is chronic right shoulder pain.  Patient has previously undergone cortisone injections as well as a physician guided home exercise program without relief.  He would like to proceed with surgical intervention at this time.  Plan is for arthroscopic extensive debridement of the rotator cuff and biceps tenotomy.  Subacromial decompression and distal clavicle excision.  Follow-Up Instructions: Return for post-op.   Orders:  No orders of the defined types were placed in this encounter.  No orders of the defined types were placed in this encounter.     Procedures: No procedures performed   Clinical Data: No additional findings.   Subjective: Chief Complaint  Patient presents with   Right Shoulder - Pain    HPI patient is a pleasant 62 year old gentleman who comes in today with continued right shoulder pain.  Symptoms have been ongoing for about 3 years and have progressively worsened.  The pain he has is throughout the entire shoulder worse with certain motions to include external rotation.  He has previously undergone cortisone injections as well as a guided home exercise program without relief.  MRI from 06/27/2022 shows severe tendinosis of the supraspinatus and infraspinatus as well as moderate tendinosis of the subscapularis.  Tendinosis of the proximal biceps as well.  Review of Systems as detailed in HPI.  All others reviewed and are negative.   Objective: Vital Signs: There were no vitals taken for this  visit.  Physical Exam well-developed well-nourished gentleman in no acute distress.  Alert and oriented x 3.  Ortho Exam right shoulder exam reveals approximately 75% active range of motion in all planes.  4 out of 5 strength throughout.  Pain with empty can testing.  Neurovascular intact distally.  Pain with impingement signs and pain with cross body adduction and tenderness to the [facility] joint.  Specialty Comments:  No specialty comments available.  Imaging: No new imaging   PMFS History: Patient Active Problem List   Diagnosis Date Noted   Impingement syndrome of right shoulder 01/25/2023   Nontraumatic incomplete tear of right rotator cuff 01/25/2023   Stenosing tenosynovitis of finger of left hand 12/22/2022   Hypercoagulable state due to paroxysmal atrial fibrillation (HCC) 07/19/2022   Nontraumatic incomplete tear of left rotator cuff 07/07/2022   Arthritis of left acromioclavicular joint 07/07/2022   Right groin hernia 03/03/2021   Hyperlipidemia associated with type 2 diabetes mellitus (HCC) 12/25/2020   Tendinopathy of right rotator cuff 09/25/2020   Arthritis of right acromioclavicular joint 09/25/2020   Adrenal adenoma, left 09/07/2019   Chronic anticoagulation    GI bleed 06/14/2019   Vitamin D deficiency 04/18/2019   History of stroke 04/20/2018   GAD (generalized anxiety disorder) 05/15/2016   Acute bronchiolitis due to respiratory syncytial virus (RSV)    Acute CVA (cerebrovascular accident) (HCC) 05/04/2016   Dizziness 05/02/2016   Anxiety and depression 05/02/2016  Paroxysmal atrial fibrillation (HCC) 03/02/2016   PFO (patent foramen ovale) 07/24/2015   OSA (obstructive sleep apnea) 04/18/2015   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 artery (HCC) 04/18/2015   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   Snoring 01/23/2015   Degeneration of cervical intervertebral disc 01/23/2015   Essential hypertension 12/14/2014   DM type 2 causing vascular  disease (HCC) 12/14/2014   Neck pain 12/14/2014   Cerebral infarction, chronic    Multi-infarct state 10/14/2014   Impingement syndrome of left shoulder 08/02/2014   Adhesive capsulitis of left shoulder 08/02/2014   Rectal bleeding 03/13/2014   Diabetes type 2, controlled (HCC) 02/18/2014   Diabetic peripheral neuropathy (HCC) 03/28/2013   Irreducible incisional hernia 11/11/2010   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   Anxiety    Arthritis    Blood transfusion without reported diagnosis    Complication of anesthesia    bleed after last shoulder-had to stay overnight   Depression    Diabetes mellitus    Diabetic peripheral neuropathy (HCC) 03/28/2013   Diverticulosis    Dizziness    Hernia, inguinal    Hyperlipidemia    Hypertension    Impingement syndrome of left shoulder 08/02/2014   Ischemic colitis (HCC)    Multi-infarct state 10/14/2014   Neuropathy of lower extremity    Night sweats    every once in a while   Sleep apnea    no CPAP   Stroke (HCC) 02/2015   no deficits   Syncope and collapse     Family History  Problem Relation Age of Onset   Stroke Father    Hyperlipidemia Father    Heart attack Sister 84   Stroke Sister    Dementia Mother    ALS Brother        age 18   Diabetes Maternal Grandfather    Colon cancer Neg Hx    Esophageal cancer Neg Hx    Stomach cancer Neg Hx    Rectal cancer Neg Hx     Past Surgical History:  Procedure Laterality Date   BIOPSY  06/14/2019   Procedure: BIOPSY;  Surgeon: Sherrilyn Rist, MD;  Location: [facility];  Service: Gastroenterology;;   CERVICAL DISC SURGERY     COLON SURGERY  05/03/2001   1/3 removed for diverticulitis   COLONOSCOPY     COLONOSCOPY WITH PROPOFOL N/A 06/14/2019   Procedure: COLONOSCOPY WITH PROPOFOL;  Surgeon: Sherrilyn Rist, MD;  Location: [facility];  Service: Gastroenterology;   Laterality: N/A;   EP IMPLANTABLE DEVICE N/A 05/01/2015   Procedure: Loop Recorder Insertion;  Surgeon: Duke Salvia, MD;  Location: [facility];  Service: Cardiovascular;  Laterality: N/A;   INGUINAL HERNIA REPAIR Bilateral    KNEE ARTHROSCOPY Right    SHOULDER ARTHROSCOPY Left 09/2022   SHOULDER ARTHROSCOPY Left 08/02/2014   Procedure: LEFT SHOULDER SCOPE DEBRIDEMENT/ACROMIOPLASTY;  Surgeon: Teryl Lucy, MD;  Location: [facility];  Service: Orthopedics;  Laterality: Left;  ANESTHESIA: GENERAL, PRE/POST OP SCALENE   TEE WITHOUT CARDIOVERSION N/A 03/03/2015   Procedure: TRANSESOPHAGEAL ECHOCARDIOGRAM (TEE);  Surgeon: Laqueta Linden, MD;  [facility];  Service: Cardiology;  Laterality: N/A;   TRIGGER FINGER RELEASE Left 12/22/2022   Procedure: LEFT A-1 PULLEY RELEASE LONG FINGER;  Surgeon: Tarry Kos, MD;  Location: [facility];  Service: Orthopedics;  Laterality: Left;   UMBILICAL HERNIA REPAIR     with other hernia repair with mesh   WRIST SURGERY Right    fusion   Social History   Occupational History   Occupation: disabled  Tobacco Use   Smoking status: Former    Current packs/day: 0.00    Average packs/day: 1 pack/day for 8.0 years (8.0 ttl pk-yrs)    Types: Cigarettes    Start date: 06/07/1970    Quit date: 05/03/1978    Years since quitting: 44.7   Smokeless tobacco: Former    Types: Chew    Quit date: 05/03/1978   Tobacco comments:    Former smoker 07/19/22  Vaping Use   Vaping status: Never Used  Substance and Sexual Activity   Alcohol use: Yes    Alcohol/week: 1.0 standard drink of alcohol    Types: 1 Cans of beer per week    Comment: 1 beer every 6 months h/o heavy use in the past 07/19/22   Drug use: No   Sexual activity: Yes

## 2023-01-26 ENCOUNTER — Other Ambulatory Visit (HOSPITAL_COMMUNITY): Payer: Self-pay

## 2023-01-26 DIAGNOSIS — H53143 Visual discomfort, bilateral: Secondary | ICD-10-CM | POA: Diagnosis not present

## 2023-01-26 DIAGNOSIS — E119 Type 2 diabetes mellitus without complications: Secondary | ICD-10-CM | POA: Diagnosis not present

## 2023-01-26 DIAGNOSIS — H43393 Other vitreous opacities, bilateral: Secondary | ICD-10-CM | POA: Diagnosis not present

## 2023-01-26 LAB — HM DIABETES EYE EXAM

## 2023-01-31 ENCOUNTER — Encounter: Payer: Self-pay | Admitting: Family Medicine

## 2023-01-31 ENCOUNTER — Telehealth: Payer: Self-pay | Admitting: Family Medicine

## 2023-01-31 NOTE — Telephone Encounter (Signed)
Front staff  I did fill out the permission form regarding the Eliquis Please scan into the system He should be noted that I recommend stopping Eliquis 2 days before the procedure then may resume the day after I did send the patient a MyChart message regarding this He is to let us know if he is having any questions or concerns Please fax the completed form along with the up-to-date protocol to the orthopedist thank you

## 2023-02-05 ENCOUNTER — Other Ambulatory Visit (HOSPITAL_COMMUNITY): Payer: Self-pay

## 2023-02-08 ENCOUNTER — Other Ambulatory Visit (HOSPITAL_COMMUNITY): Payer: Self-pay

## 2023-02-08 NOTE — Progress Notes (Unsigned)
[facility]  PATIENT: Travis [name] DOB: 1960-12-10   REASON FOR VISIT: Follow-up for history of stroke HISTORY FROM: Patient  Chief complaint: No chief complaint on file.    HISTORY OF PRESENT ILLNESS:   Travis [name] is a 62 y.o. Caucasian male with PMHx significant for multiple prior strokes, atrial fibrillation on Eliquis (dx 2017 per [facility]), hx of TBI, HTN, HLD, DM, chronic lumbar spinal stenosis, chronic cervical pain  s/p fusion, hx of chronic LLE DVT, hx of GI bleed 2/2 ischemic colitis 2021, episodic vertigo/dizziness and OSA with CPAP noncompliance/intolerance.  He was initially referred to this office in 2016 evaluated by Dr. Roda Shutters for dizzy spells and history of prior strokes.  Today, 02/09/2023, Travis [name] returns for 1 year stroke follow-up unaccompanied.  Overall stable from stroke standpoint.  Denies new stroke/TIA symptoms.  Compliant on Eliquis and atorvastatin without side effects.  Routinely followed by PCP and endocrinology.  Remains on gabapentin for chronic nerve pain tolerating without side effects.   S/p left shoulder arthroplasty 09/01/2022 by Dr. Roda Shutters Scheduled for arthroscopic debridement of right rotator cuff and bicep tenotomy, subacromial decompression and distal clavicle excision on 10/30 with Dr. Roda Shutters.    Does continue to experience chronic pain in multiple joints, previously followed by Dr. Roda Shutters orthopedics but has not been seen in their office in a while. He has been experiencing more hip pain recently, try to keep active and moving.      REVIEW OF SYSTEMS: Full 14 system review of systems performed and notable only for those listed, all others are neg:  those listed in HPI     ALLERGIES: Allergies  Allergen Reactions   Lisinopril Cough    Patient/spouse is not aware/familiar with why this is listed as an allergy Not a true allergy but on this list to avoid ACE inhibitors-SA Luking MD    HOME MEDICATIONS: Outpatient  Medications Prior to Visit  Medication Sig Dispense Refill   Accu-Chek FastClix Lancets MISC Use to check blood sugar twice daily. 204 each 2   ALPRAZolam (XANAX) 0.25 MG tablet Take 0.5 tablets (0.125 mg total) by mouth in the morning AND 1 tablet (0.25 mg total) daily in the afternoon AND 1 tablet (0.25 mg total) every evening. Take as needed 75 tablet 3   apixaban (ELIQUIS) 5 MG TABS tablet Take 1 tablet (5 mg total) by mouth 2 (two) times daily. 180 tablet 3   atorvastatin (LIPITOR) 20 MG tablet Take 1 tablet (20 mg total) by mouth daily. 90 tablet 1   Blood Glucose Monitoring Suppl (FREESTYLE LITE) w/Device KIT Use to check glucose twice daily 1 kit 0   Blood Pressure Monitoring (OMRON 3 SERIES BP MONITOR) DEVI USE AS DIRECTED. 1 each 0   Cholecalciferol 50 MCG (2000 UT) CAPS Take 1 capsule (2,000 Units total) by mouth daily with breakfast. 30 each 6   Continuous Glucose Sensor (DEXCOM G7 SENSOR) MISC Inject into the skin as directed to check continuous blood sugar. Change sensor every 10 days as directed. 9 each 3   COVID-19 mRNA vaccine 2023-2024 (COMIRNATY) syringe Inject into the muscle. 0.3 mL 0   FLUoxetine (PROZAC) 20 MG capsule Take 1 capsule by mouth daily. 90 capsule 1   fluticasone (FLONASE) 50 MCG/ACT nasal spray Place 1 spray into both nostrils daily. 16 g 3   folic acid (FOLVITE) 1 MG tablet Take 1 tablet (1 mg) by mouth daily. 30 tablet 2   gabapentin (NEURONTIN) 300 MG capsule  Take 1 capsule (300 mg total) by mouth every morning AND 1 capsule (300 mg total) daily after lunch AND 2 capsules (600 mg total) at bedtime. 120 capsule 11   glucose blood (ACCU-CHEK GUIDE) test strip USE AS DIRECTED TO TEST BLOOD SUGAR 2 TIMES DAILY AS DIRECTED 200 strip 2   HYDROcodone-acetaminophen (NORCO) 5-325 MG tablet Take 1 tablet by mouth 3 (three) times daily as needed. 10 tablet 0   insulin degludec (TRESIBA FLEXTOUCH) 100 UNIT/ML FlexTouch Pen Inject 30 Units into the skin at bedtime. 30 mL 3    Insulin Pen Needle (COMFORT EZ PEN NEEDLES) 31G X 6 MM MISC USE TO INJECT INSULIN DAILY AS DIRECTED 100 each 2   Insulin Pen Needle (UNIFINE PENTIPS) 31G X 6 MM MISC USE TO INJECT INSULIN DAILY AS DIRECTED 100 each 1   metFORMIN (GLUCOPHAGE) 500 MG tablet Take 1 tablet (500 mg total) by mouth 2 (two) times daily with a meal. 180 tablet 3   methocarbamol (ROBAXIN) 750 MG tablet Take 1 tablet (750 mg total) by mouth 2 (two) times daily as needed for muscle spasms. 20 tablet 3   Multiple Vitamin (MULTIVITAMIN WITH MINERALS) TABS tablet Take 1 tablet daily by mouth.     ondansetron (ZOFRAN) 4 MG tablet Take 1-2 tablets (4-8 mg total) by mouth every 8 (eight) hours as needed for nausea or vomiting. 20 tablet 0   RSV vaccine recomb adjuvanted (AREXVY) 120 MCG/0.5ML injection Inject into the muscle. 1 each 0   valACYclovir (VALTREX) 1000 MG tablet Take 1 tablet by mouth daily. 90 tablet 3   No facility-administered medications prior to visit.    PAST MEDICAL HISTORY: 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   Anxiety    Arthritis    Blood transfusion without reported diagnosis    Complication of anesthesia    bleed after last shoulder-had to stay overnight   Depression    Diabetes mellitus    Diabetic peripheral neuropathy (HCC) 03/28/2013   Diverticulosis    Dizziness    Hernia, inguinal    Hyperlipidemia    Hypertension    Impingement syndrome of left shoulder 08/02/2014   Ischemic colitis (HCC)    Multi-infarct state 10/14/2014   Neuropathy of lower extremity    Night sweats    every once in a while   Sleep apnea    no CPAP   Stroke (HCC) 02/2015   no deficits   Syncope and collapse     PAST SURGICAL HISTORY: Past Surgical History:  Procedure Laterality Date   BIOPSY  06/14/2019   Procedure: BIOPSY;  Surgeon: Sherrilyn Rist, MD;  Location: [facility];  Service:  Gastroenterology;;   CERVICAL DISC SURGERY     COLON SURGERY  05/03/2001   1/3 removed for diverticulitis   COLONOSCOPY     COLONOSCOPY WITH PROPOFOL N/A 06/14/2019   Procedure: COLONOSCOPY WITH PROPOFOL;  Surgeon: Sherrilyn Rist, MD;  Location: [facility];  Service: Gastroenterology;  Laterality: N/A;   EP IMPLANTABLE DEVICE N/A 05/01/2015   Procedure: Loop Recorder Insertion;  Surgeon: Duke Salvia, MD;  Location: [facility] INVASIVE CV LAB;  Service: Cardiovascular;  Laterality: N/A;   INGUINAL HERNIA REPAIR Bilateral    KNEE ARTHROSCOPY Right    SHOULDER ARTHROSCOPY Left 09/2022   SHOULDER ARTHROSCOPY Left 08/02/2014   Procedure: LEFT SHOULDER SCOPE DEBRIDEMENT/ACROMIOPLASTY;  Surgeon: Teryl Lucy, MD;  Location: [facility];  Service: Orthopedics;  Laterality: Left;  ANESTHESIA: GENERAL, PRE/POST OP SCALENE   TEE WITHOUT CARDIOVERSION N/A 03/03/2015   Procedure: TRANSESOPHAGEAL ECHOCARDIOGRAM (TEE);  Surgeon: Laqueta Linden, MD;  Location: [facility];  Service: Cardiology;  Laterality: N/A;   TRIGGER FINGER RELEASE Left 12/22/2022   Procedure: LEFT A-1 PULLEY RELEASE LONG FINGER;  Surgeon: Tarry Kos, MD;  Location:  SURGERY CENTER;  Service: Orthopedics;  Laterality: Left;   UMBILICAL HERNIA REPAIR     with other hernia repair with mesh   WRIST SURGERY Right    fusion    FAMILY HISTORY: Family History  Problem Relation Age of Onset   Stroke Father    Hyperlipidemia Father    Heart attack Sister 69   Stroke Sister    Dementia Mother    ALS Brother        age 70   Diabetes Maternal Grandfather    Colon cancer Neg Hx    Esophageal cancer Neg Hx    Stomach cancer Neg Hx    Rectal cancer Neg Hx     SOCIAL HISTORY: Social History   Socioeconomic History   Marital status: Married    Spouse name: Not on file   Number of children: 2   Years of education: College   Highest education level: Some college, no degree  Occupational History    Occupation: disabled  Tobacco Use   Smoking status: Former    Current packs/day: 0.00    Average packs/day: 1 pack/day for 8.0 years (8.0 ttl pk-yrs)    Types: Cigarettes    Start date: 06/07/1970    Quit date: 05/03/1978    Years since quitting: 44.8   Smokeless tobacco: Former    Types: Chew    Quit date: 05/03/1978   Tobacco comments:    Former smoker 07/19/22  Vaping Use   Vaping status: Never Used  Substance and Sexual Activity   Alcohol use: Yes    Alcohol/week: 1.0 standard drink of alcohol    Types: 1 Cans of beer per week    Comment: 1 beer every 6 months h/o heavy use in the past 07/19/22   Drug use: No   Sexual activity: Yes  Other Topics Concern   Not on file  Social History Narrative   Drinks some coffee, Drink diet sodas and tea   Social Determinants of Health   Financial Resource Strain: Low Risk  (10/10/2022)   Overall Financial Resource Strain (CARDIA)    Difficulty of Paying Living Expenses: Not hard at all  Food Insecurity: No Food Insecurity (10/10/2022)   Hunger Vital Sign    Worried About Running Out of Food in the Last Year: Never true    Ran Out of Food in the Last Year: Never true  Transportation Needs: No Transportation Needs (10/10/2022)   PRAPARE - Administrator, Civil Service (Medical): No    Lack of Transportation (Non-Medical): No  Physical Activity: Unknown (10/10/2022)   Exercise Vital Sign    Days of Exercise per Week: 0 days    Minutes of Exercise per Session: Not on file  Stress: No Stress Concern Present (10/10/2022)   [employer] of Occupational Health - Occupational Stress Questionnaire    Feeling of Stress : Only a little  Social Connections: Moderately Integrated (10/10/2022)   Social Connection and Isolation Panel [NHANES]    Frequency of Communication with Friends and Family: More than three times a week  Frequency of Social Gatherings with Friends and Family: Three times a week    Attends Religious Services: 1 to 4  times per year    Active Member of Clubs or Organizations: No    Attends Engineer, structural: Not on file    Marital Status: Married  Catering manager Violence: Not on file     PHYSICAL EXAM  There were no vitals filed for this visit.   There is no height or weight on file to calculate BMI.  General: well developed, well nourished,  pleasant middle-age Caucasian male, seated, in no evident distress Neck: supple with no carotid or supraclavicular bruits Cardiovascular: regular rate and rhythm, no murmurs Vascular:  Normal pulses all extremities Musculoskeletal: Limited ROM BUE 2/2 pain   Neurologic Exam Mental Status: Awake and fully alert. Occasional speech hesitancy. Oriented to place and time. Recent and remote memory intact. Attention span, concentration and fund of knowledge appropriate. Mood and affect appropriate.  Cranial Nerves: Pupils equal, briskly reactive to light. Extraocular movements full without nystagmus. Visual fields full to confrontation. Hearing intact. Facial sensation intact. Face, tongue, palate moves normally and symmetrically.  Motor: Normal bulk and tone. Normal strength in all tested extremity muscles. Sensory.:  Decreased sensation to light touch left arm distally compared to right side (chronic) Coordination: Rapid alternating movements normal in all extremities except decreased left hand. Finger-to-nose and heel-to-shin performed accurately bilaterally. Gait and Station: Arises from chair without difficulty. Stance is normal. Gait demonstrates normal stride length and balance Reflexes: 1+ and symmetric. Toes downgoing.      ASSESSMENT AND PLAN Travis [name] is a 62 y.o. male with PMH of HTN, HLD, DM, chronic left DVT, atrial fibrillation (dx loop recorder) on Eliquis, GI bleed 2/2 ischemic colitis 2021 (d/c asa), cervical radiculopathy, OSA with CPAP noncompliance, old lacunar strokes involving b/l cerebrellar and left caudate head as well  as possible right frontal cortex. On 02/28/15, right SCA cerebellar infarct with evidence of small PFO.  On 05/02/16, left dorsal medulla lacunar infarct and initiated aspirin in addition to Eliquis although discontinued 2021 in setting of GI bleed.     Hx of multiple stroke -Continue Eliquis and atorvastatin for secondary stroke prevention and history of A. Fib managed/prescribed by PCP/cardiology -Continue close PCP follow-up for aggressive stroke risk factor management including HTN with BP goal<130/90, HLD with LDL goal<70 and DM with A1c goal<7.0 -Stroke labs 11/2022: LDL 46, A1c 7.7  Cervical radiculopathy Chronic nerve pain -eval by neurosurg -not a candidate for surgical options due to history of multiple cervical procedures -Continue gabapentin 300/300/600 -refill provided -MR CERVICAL 01/2020: C3-4 and C4-5 severe bilateral foraminal stenosis, C6-7 mild spinal stenosis and severe bilateral foraminal stenosis - per report, similar appearance compared to 2016 -EMG/NCV 05/2018: Mild neuropathies left wrist, chronic denervation in left deltoid, left biceps and left tricep muscles which could be seen on left cervical polyradiculopathy (C5, C6 and C7).  Recommend correlation with MRI or CT cervical spine     Follow-up in 1 year or call earlier if needed   CC:  Luking, Jonna Coup, MD    I spent 26 minutes of face-to-face and non-face-to-face time with patient.  This included previsit chart review, lab review, study review, electronic health record documentation, patient education and discussion regarding above diagnoses and treatment plan and answered all the questions to patient satisfaction  Ihor Austin, [facility]  [facility] [address] [place], [place] [address]  Phone [phone] Fax [phone] Note:  This document was prepared with digital dictation and possible smart phrase technology. Any transcriptional errors that result from this  process are unintentional.

## 2023-02-09 ENCOUNTER — Other Ambulatory Visit (HOSPITAL_COMMUNITY): Payer: Self-pay

## 2023-02-09 ENCOUNTER — Encounter: Payer: Self-pay | Admitting: Adult Health

## 2023-02-09 ENCOUNTER — Ambulatory Visit (INDEPENDENT_AMBULATORY_CARE_PROVIDER_SITE_OTHER): Payer: 59 | Admitting: Adult Health

## 2023-02-09 ENCOUNTER — Other Ambulatory Visit: Payer: Self-pay

## 2023-02-09 VITALS — BP 126/77 | HR 63 | Ht 73.0 in | Wt 185.0 lb

## 2023-02-09 DIAGNOSIS — Z8673 Personal history of transient ischemic attack (TIA), and cerebral infarction without residual deficits: Secondary | ICD-10-CM | POA: Diagnosis not present

## 2023-02-09 DIAGNOSIS — G629 Polyneuropathy, unspecified: Secondary | ICD-10-CM | POA: Diagnosis not present

## 2023-02-09 DIAGNOSIS — M5412 Radiculopathy, cervical region: Secondary | ICD-10-CM | POA: Diagnosis not present

## 2023-02-09 MED ORDER — GABAPENTIN 300 MG PO CAPS
ORAL_CAPSULE | ORAL | 11 refills | Status: DC
  Filled 2023-02-09 – 2023-02-22 (×2): qty 120, 30d supply, fill #0
  Filled 2023-04-07: qty 120, 30d supply, fill #1
  Filled 2023-05-09: qty 120, 30d supply, fill #2
  Filled 2023-06-18: qty 120, 30d supply, fill #3
  Filled 2023-07-15: qty 120, 30d supply, fill #4
  Filled 2023-08-13: qty 360, 90d supply, fill #5
  Filled 2023-11-07: qty 360, 90d supply, fill #6
  Filled 2024-01-22: qty 120, 30d supply, fill #7

## 2023-02-09 NOTE — Patient Instructions (Addendum)
Your Plan:  No changes today -continue current treatment plan for secondary stroke prevention measures with routine follow-up with PCP and endocrinology for aggressive stroke risk factor management  Continue gabapentin at current dosage for neuropathy  Continue to follow with Dr. Roda Shutters as scheduled      Follow up in 1 year or call earlier if needed     Thank you for coming to see [place] at [facility]. I hope we have been able to provide you high quality care today.  You may receive a patient satisfaction survey over the next few weeks. We would appreciate your feedback and comments so that we may continue to improve ourselves and the health of our patients.

## 2023-02-11 ENCOUNTER — Other Ambulatory Visit: Payer: Self-pay

## 2023-02-11 ENCOUNTER — Other Ambulatory Visit (HOSPITAL_COMMUNITY): Payer: Self-pay

## 2023-02-12 ENCOUNTER — Other Ambulatory Visit (HOSPITAL_COMMUNITY): Payer: Self-pay

## 2023-02-14 ENCOUNTER — Other Ambulatory Visit (HOSPITAL_COMMUNITY): Payer: Self-pay

## 2023-02-14 ENCOUNTER — Other Ambulatory Visit: Payer: Self-pay

## 2023-02-19 ENCOUNTER — Other Ambulatory Visit (HOSPITAL_COMMUNITY): Payer: Self-pay

## 2023-02-22 ENCOUNTER — Other Ambulatory Visit: Payer: Self-pay

## 2023-02-22 ENCOUNTER — Other Ambulatory Visit: Payer: Self-pay | Admitting: Physician Assistant

## 2023-02-22 ENCOUNTER — Encounter (HOSPITAL_BASED_OUTPATIENT_CLINIC_OR_DEPARTMENT_OTHER): Payer: Self-pay | Admitting: Orthopaedic Surgery

## 2023-02-22 ENCOUNTER — Ambulatory Visit: Payer: 59

## 2023-02-22 ENCOUNTER — Other Ambulatory Visit (HOSPITAL_COMMUNITY): Payer: Self-pay

## 2023-02-22 DIAGNOSIS — Z23 Encounter for immunization: Secondary | ICD-10-CM

## 2023-02-22 MED ORDER — ONDANSETRON HCL 4 MG PO TABS
4.0000 mg | ORAL_TABLET | Freq: Three times a day (TID) | ORAL | 0 refills | Status: DC | PRN
  Filled 2023-02-22: qty 40, 14d supply, fill #0

## 2023-02-22 MED ORDER — HYDROCODONE-ACETAMINOPHEN 5-325 MG PO TABS
1.0000 | ORAL_TABLET | Freq: Four times a day (QID) | ORAL | 0 refills | Status: DC | PRN
  Filled 2023-02-22: qty 30, 8d supply, fill #0

## 2023-02-28 ENCOUNTER — Other Ambulatory Visit (HOSPITAL_BASED_OUTPATIENT_CLINIC_OR_DEPARTMENT_OTHER): Payer: Self-pay

## 2023-02-28 ENCOUNTER — Other Ambulatory Visit (HOSPITAL_COMMUNITY): Payer: Self-pay

## 2023-02-28 ENCOUNTER — Other Ambulatory Visit: Payer: Self-pay | Admitting: Physician Assistant

## 2023-02-28 DIAGNOSIS — D225 Melanocytic nevi of trunk: Secondary | ICD-10-CM | POA: Diagnosis not present

## 2023-03-01 ENCOUNTER — Encounter (HOSPITAL_BASED_OUTPATIENT_CLINIC_OR_DEPARTMENT_OTHER)
Admission: RE | Admit: 2023-03-01 | Discharge: 2023-03-01 | Disposition: A | Discharge status: Home / Self Care | Payer: 59 | Source: Ambulatory Visit | Attending: Orthopaedic Surgery | Admitting: Orthopaedic Surgery

## 2023-03-01 DIAGNOSIS — M19011 Primary osteoarthritis, right shoulder: Secondary | ICD-10-CM | POA: Diagnosis present

## 2023-03-01 DIAGNOSIS — M75111 Incomplete rotator cuff tear or rupture of right shoulder, not specified as traumatic: Secondary | ICD-10-CM | POA: Diagnosis not present

## 2023-03-01 DIAGNOSIS — M25811 Other specified joint disorders, right shoulder: Secondary | ICD-10-CM | POA: Diagnosis not present

## 2023-03-01 LAB — BASIC METABOLIC PANEL
Anion gap: 3 — ABNORMAL LOW (ref 5–15)
BUN: 12 mg/dL (ref 8–23)
CO2: 28 mmol/L (ref 22–32)
Calcium: 8.7 mg/dL — ABNORMAL LOW (ref 8.9–10.3)
Chloride: 108 mmol/L (ref 98–111)
Creatinine, Ser: 0.99 mg/dL (ref 0.61–1.24)
GFR, Estimated: >60 mL/min (ref >60)
Glucose, Bld: 184 mg/dL — ABNORMAL HIGH (ref 70–99)
Potassium: 3.9 mmol/L (ref 3.5–5.1)
Sodium: 139 mmol/L (ref 135–145)

## 2023-03-01 NOTE — Progress Notes (Signed)

## 2023-03-02 ENCOUNTER — Other Ambulatory Visit: Payer: Self-pay

## 2023-03-02 ENCOUNTER — Ambulatory Visit (HOSPITAL_BASED_OUTPATIENT_CLINIC_OR_DEPARTMENT_OTHER): Payer: 59 | Admitting: Anesthesiology

## 2023-03-02 ENCOUNTER — Ambulatory Visit (HOSPITAL_BASED_OUTPATIENT_CLINIC_OR_DEPARTMENT_OTHER)
Admission: RE | Admit: 2023-03-02 | Discharge: 2023-03-02 | Disposition: A | Discharge status: Home / Self Care | Payer: 59 | Attending: Orthopaedic Surgery | Admitting: Orthopaedic Surgery

## 2023-03-02 ENCOUNTER — Encounter (HOSPITAL_BASED_OUTPATIENT_CLINIC_OR_DEPARTMENT_OTHER)
Admission: RE | Disposition: A | Discharge status: Home / Self Care | Payer: Self-pay | Source: Home / Self Care | Attending: Orthopaedic Surgery

## 2023-03-02 ENCOUNTER — Encounter (HOSPITAL_BASED_OUTPATIENT_CLINIC_OR_DEPARTMENT_OTHER): Payer: Self-pay | Admitting: Orthopaedic Surgery

## 2023-03-02 ENCOUNTER — Encounter: Payer: Self-pay | Admitting: Orthopaedic Surgery

## 2023-03-02 DIAGNOSIS — I10 Essential (primary) hypertension: Secondary | ICD-10-CM | POA: Diagnosis not present

## 2023-03-02 DIAGNOSIS — M7541 Impingement syndrome of right shoulder: Secondary | ICD-10-CM | POA: Diagnosis not present

## 2023-03-02 DIAGNOSIS — G8918 Other acute postprocedural pain: Secondary | ICD-10-CM | POA: Diagnosis not present

## 2023-03-02 DIAGNOSIS — I4891 Unspecified atrial fibrillation: Secondary | ICD-10-CM | POA: Diagnosis not present

## 2023-03-02 DIAGNOSIS — M75111 Incomplete rotator cuff tear or rupture of right shoulder, not specified as traumatic: Secondary | ICD-10-CM | POA: Diagnosis not present

## 2023-03-02 DIAGNOSIS — M75101 Unspecified rotator cuff tear or rupture of right shoulder, not specified as traumatic: Secondary | ICD-10-CM

## 2023-03-02 DIAGNOSIS — E119 Type 2 diabetes mellitus without complications: Secondary | ICD-10-CM | POA: Diagnosis not present

## 2023-03-02 DIAGNOSIS — E1159 Type 2 diabetes mellitus with other circulatory complications: Secondary | ICD-10-CM

## 2023-03-02 DIAGNOSIS — M19011 Primary osteoarthritis, right shoulder: Secondary | ICD-10-CM | POA: Insufficient documentation

## 2023-03-02 DIAGNOSIS — M25811 Other specified joint disorders, right shoulder: Secondary | ICD-10-CM | POA: Insufficient documentation

## 2023-03-02 LAB — GLUCOSE, CAPILLARY
Glucose-Capillary: 160 mg/dL — ABNORMAL HIGH (ref 70–99)
Glucose-Capillary: 127 mg/dL — ABNORMAL HIGH (ref 70–99)

## 2023-03-02 SURGERY — SHOULDER ARTHROSCOPY WITH SUBACROMIAL DECOMPRESSION AND DISTAL CLAVICLE EXCISION
Anesthesia: General | Site: Shoulder | Laterality: Right

## 2023-03-02 MED ORDER — CEFAZOLIN SODIUM-DEXTROSE 2-4 GM/100ML-% IV SOLN: 2.0000 g | INTRAVENOUS | Status: DC

## 2023-03-02 MED ORDER — LACTATED RINGERS IV SOLN: INTRAVENOUS | Status: DC

## 2023-03-02 MED ORDER — PROPOFOL 10 MG/ML IV BOLUS
INTRAVENOUS | Status: DC | PRN
  Administered 2023-03-02: 200 mg via INTRAVENOUS

## 2023-03-02 MED ORDER — ACETAMINOPHEN 500 MG PO TABS: 1000.0000 mg | ORAL_TABLET | Freq: Once | ORAL | Status: DC

## 2023-03-02 MED ORDER — ONDANSETRON HCL 4 MG/2ML IJ SOLN
INTRAMUSCULAR | Status: DC | PRN
  Administered 2023-03-02: 4 mg via INTRAVENOUS

## 2023-03-02 MED ORDER — CEFAZOLIN SODIUM-DEXTROSE 2-3 GM-%(50ML) IV SOLR
INTRAVENOUS | Status: DC | PRN
  Administered 2023-03-02: 2 g via INTRAVENOUS

## 2023-03-02 MED ORDER — SODIUM CHLORIDE 0.9 % IR SOLN
Status: DC | PRN
  Administered 2023-03-02: 9000 mL

## 2023-03-02 MED ORDER — BUPIVACAINE LIPOSOME 1.3 % IJ SUSP
INTRAMUSCULAR | Status: DC | PRN
  Administered 2023-03-02: 10 mL via PERINEURAL

## 2023-03-02 MED ORDER — OXYCODONE HCL 5 MG PO TABS: 5.0000 mg | ORAL_TABLET | Freq: Once | ORAL | Status: DC | PRN

## 2023-03-02 MED ORDER — PHENYLEPHRINE HCL (PRESSORS) 10 MG/ML IV SOLN
INTRAVENOUS | Status: AC
  Filled 2023-03-02: qty 1

## 2023-03-02 MED ORDER — AMISULPRIDE (ANTIEMETIC) 5 MG/2ML IV SOLN
10.0000 mg | Freq: Once | INTRAVENOUS | Status: DC | PRN
Start: 2023-03-02 — End: 2023-03-02

## 2023-03-02 MED ORDER — PHENYLEPHRINE HCL-NACL 20-0.9 MG/250ML-% IV SOLN
INTRAVENOUS | Status: DC | PRN
  Administered 2023-03-02: 45 ug/min via INTRAVENOUS

## 2023-03-02 MED ORDER — SUGAMMADEX SODIUM 200 MG/2ML IV SOLN
INTRAVENOUS | Status: DC | PRN
  Administered 2023-03-02: 300 mg via INTRAVENOUS

## 2023-03-02 MED ORDER — MIDAZOLAM HCL 2 MG/2ML IJ SOLN
2.0000 mg | Freq: Once | INTRAMUSCULAR | Status: AC
  Administered 2023-03-02: 2 mg via INTRAVENOUS

## 2023-03-02 MED ORDER — PHENYLEPHRINE HCL (PRESSORS) 10 MG/ML IV SOLN
INTRAVENOUS | Status: AC
Start: 2023-03-02 — End: ?
  Filled 2023-03-02: qty 1

## 2023-03-02 MED ORDER — FENTANYL CITRATE (PF) 100 MCG/2ML IJ SOLN
INTRAMUSCULAR | Status: DC | PRN
  Administered 2023-03-02 (×2): 50 ug via INTRAVENOUS

## 2023-03-02 MED ORDER — FENTANYL CITRATE (PF) 100 MCG/2ML IJ SOLN
100.0000 ug | Freq: Once | INTRAMUSCULAR | Status: AC
  Administered 2023-03-02: 100 ug via INTRAVENOUS

## 2023-03-02 MED ORDER — LIDOCAINE 2% (20 MG/ML) 5 ML SYRINGE
INTRAMUSCULAR | Status: DC | PRN
  Administered 2023-03-02: 100 mg via INTRAVENOUS

## 2023-03-02 MED ORDER — DEXAMETHASONE SODIUM PHOSPHATE 10 MG/ML IJ SOLN
INTRAMUSCULAR | Status: DC | PRN
  Administered 2023-03-02: 10 mg via INTRAVENOUS

## 2023-03-02 MED ORDER — SODIUM CHLORIDE 0.9 % IV SOLN: 12.5000 mg | INTRAVENOUS | Status: DC | PRN

## 2023-03-02 MED ORDER — FENTANYL CITRATE (PF) 100 MCG/2ML IJ SOLN
INTRAMUSCULAR | Status: AC
  Filled 2023-03-02: qty 2

## 2023-03-02 MED ORDER — MIDAZOLAM HCL 5 MG/5ML IJ SOLN
INTRAMUSCULAR | Status: DC | PRN
  Administered 2023-03-02: 2 mg via INTRAVENOUS

## 2023-03-02 MED ORDER — BUPIVACAINE HCL (PF) 0.5 % IJ SOLN
INTRAMUSCULAR | Status: DC | PRN
  Administered 2023-03-02: 10 mL via PERINEURAL

## 2023-03-02 MED ORDER — OXYCODONE HCL 5 MG/5ML PO SOLN: 5.0000 mg | Freq: Once | ORAL | Status: DC | PRN

## 2023-03-02 MED ORDER — ROCURONIUM BROMIDE 10 MG/ML (PF) SYRINGE
PREFILLED_SYRINGE | INTRAVENOUS | Status: DC | PRN
  Administered 2023-03-02: 50 mg via INTRAVENOUS

## 2023-03-02 MED ORDER — MIDAZOLAM HCL 2 MG/2ML IJ SOLN
INTRAMUSCULAR | Status: AC
  Filled 2023-03-02: qty 2

## 2023-03-02 MED ORDER — CEFAZOLIN SODIUM-DEXTROSE 2-4 GM/100ML-% IV SOLN
INTRAVENOUS | Status: AC
  Filled 2023-03-02: qty 100

## 2023-03-02 MED ORDER — FENTANYL CITRATE (PF) 100 MCG/2ML IJ SOLN: 25.0000 ug | INTRAMUSCULAR | Status: DC | PRN

## 2023-03-02 SURGICAL SUPPLY — 50 items
BLADE EXCALIBUR 4.0X13 (MISCELLANEOUS) IMPLANT
BURR OVAL 8 FLU 4.0X13 (MISCELLANEOUS) ×1 IMPLANT
CANNULA 5.75X71 LONG (CANNULA) ×1 IMPLANT
CANNULA SHOULDER 7CM (CANNULA) IMPLANT
CANNULA TWIST IN 8.25X7CM (CANNULA) IMPLANT
COOLER ICEMAN CLASSIC (MISCELLANEOUS) ×1 IMPLANT
DISSECTOR 3.8MM X 13CM (MISCELLANEOUS) ×1 IMPLANT
DRAPE IMP U-DRAPE 54X76 (DRAPES) ×1 IMPLANT
DRAPE INCISE IOBAN 66X45 STRL (DRAPES) ×1 IMPLANT
DRAPE STERI 35X30 U-POUCH (DRAPES) ×1 IMPLANT
DRAPE U-SHAPE 47X51 STRL (DRAPES) ×2 IMPLANT
DRAPE U-SHAPE 76X120 STRL (DRAPES) ×2 IMPLANT
DURAPREP 26ML APPLICATOR (WOUND CARE) ×2 IMPLANT
ELECT REM PT RETURN 9FT ADLT (ELECTROSURGICAL)
ELECTRODE REM PT RTRN 9FT ADLT (ELECTROSURGICAL) IMPLANT
GAUZE PAD ABD 8X10 STRL (GAUZE/BANDAGES/DRESSINGS) ×2 IMPLANT
GAUZE SPONGE 4X4 12PLY STRL (GAUZE/BANDAGES/DRESSINGS) ×2 IMPLANT
GAUZE XEROFORM 1X8 LF (GAUZE/BANDAGES/DRESSINGS) ×1 IMPLANT
GLOVE BIOGEL PI IND STRL 7.5 (GLOVE) ×1 IMPLANT
GLOVE ECLIPSE 7.0 STRL STRAW (GLOVE) ×2 IMPLANT
GLOVE INDICATOR 7.0 STRL GRN (GLOVE) ×1 IMPLANT
GLOVE SURG SYN 7.5 E (GLOVE) ×2
GLOVE SURG SYN 7.5 PF PI (GLOVE) ×2 IMPLANT
GOWN STRL REUS W/ TWL LRG LVL3 (GOWN DISPOSABLE) ×1 IMPLANT
GOWN STRL REUS W/TWL LRG LVL3 (GOWN DISPOSABLE) ×1
GOWN STRL SURGICAL XL XLNG (GOWN DISPOSABLE) ×2 IMPLANT
MANIFOLD NEPTUNE II (INSTRUMENTS) ×1 IMPLANT
NDL HD SCORPION MEGA LOADER (NEEDLE) IMPLANT
PACK ARTHROSCOPY DSU (CUSTOM PROCEDURE TRAY) ×1 IMPLANT
PACK BASIN DAY SURGERY FS (CUSTOM PROCEDURE TRAY) ×1 IMPLANT
PAD COLD SHLDR UNI WRAP-ON (PAD) ×1
PAD COLD SHLDR WRAP-ON (PAD) ×1 IMPLANT
PAD COLD UNI WRAP-ON (PAD) IMPLANT
SHEET MEDIUM DRAPE 40X70 STRL (DRAPES) ×1 IMPLANT
SLEEVE SCD COMPRESS KNEE MED (STOCKING) ×1 IMPLANT
SLING ARM FOAM STRAP LRG (SOFTGOODS) IMPLANT
SPIKE FLUID TRANSFER (MISCELLANEOUS) IMPLANT
SUT ETHILON 3 0 PS 1 (SUTURE) ×1 IMPLANT
SUT FIBERWIRE #2 38 T-5 BLUE (SUTURE)
SUT TIGER TAPE 7 IN WHITE (SUTURE) IMPLANT
SUTURE FIBERWR #2 38 T-5 BLUE (SUTURE) IMPLANT
SUTURE TAPE 1.3 40 TPR END (SUTURE) IMPLANT
SUTURE TAPE TIGERLINK 1.3MM BL (SUTURE) IMPLANT
SUTURETAPE 1.3 40 TPR END (SUTURE)
SUTURETAPE TIGERLINK 1.3MM BL (SUTURE)
TAPE FIBER 2MM 7IN #2 BLUE (SUTURE) IMPLANT
TOWEL GREEN STERILE FF (TOWEL DISPOSABLE) ×1 IMPLANT
TUBE CONNECTING 20X1/4 (TUBING) ×1 IMPLANT
TUBING ARTHROSCOPY IRRIG 16FT (MISCELLANEOUS) ×1 IMPLANT
WAND ABLATOR APOLLO I90 (BUR) ×1 IMPLANT

## 2023-03-02 NOTE — Anesthesia Procedure Notes (Signed)
Anesthesia Regional Block: Interscalene brachial plexus block   Pre-Anesthetic Checklist: , timeout performed,  Correct Patient, Correct Site, Correct Laterality,  Correct Procedure, Correct Position, site marked,  Risks and benefits discussed,  Surgical consent,  Pre-op evaluation,  At surgeon's request and post-op pain management  Laterality: Right  Prep: chloraprep       Needles:  Injection technique: Single-shot  Needle Type: Echogenic Stimulator Needle     Needle Length: 5cm  Needle Gauge: 22     Additional Needles:   Procedures:, nerve stimulator,,, ultrasound used (permanent image in chart),,     Nerve Stimulator or Paresthesia:  Response: hand, 0.45 mA  Additional Responses:   Narrative:  Start time: 03/02/2023 12:04 PM End time: 03/02/2023 12:10 PM Injection made incrementally with aspirations every 5 mL.  Performed by: Personally  Anesthesiologist: Bethena Midget, MD  Additional Notes: Functioning IV was confirmed and monitors were applied.  A 50mm 22ga Arrow echogenic stimulator needle was used. Sterile prep and drape,hand hygiene and sterile gloves were used. Ultrasound guidance: relevant anatomy identified, needle position confirmed, local anesthetic spread visualized around nerve(s)., vascular puncture avoided.  Image printed for medical record. Negative aspiration and negative test dose prior to incremental administration of local anesthetic. The patient tolerated the procedure well.

## 2023-03-02 NOTE — Anesthesia Postprocedure Evaluation (Signed)
Anesthesia Post Note  Patient: ELIAH NEYLAND  Procedure(s) Performed: RIGHT SHOULDER ARTHROSCOPY, EXTENSIVE DEBRIDEMENT, SUBACROMIAL DECOMPRESSION, DISTAL CLAVICLE EXCISION (Right: Shoulder)     Patient location during evaluation: PACU Anesthesia Type: General Level of consciousness: awake and alert Pain management: pain level controlled Vital Signs Assessment: post-procedure vital signs reviewed and stable Respiratory status: spontaneous breathing, nonlabored ventilation, respiratory function stable and patient connected to nasal cannula oxygen Cardiovascular status: blood pressure returned to baseline and stable Postop Assessment: no apparent nausea or vomiting Anesthetic complications: no  No notable events documented.  Last Vitals:  Vitals:   03/02/23 1430 03/02/23 1454  BP: (!) 172/90 (!) 160/96  Pulse: 68 98  Resp: 15 16  Temp:  36.5 C  SpO2: 95% 98%    Last Pain:  Vitals:   03/02/23 1454  PainSc: 6                  Diem Dicocco

## 2023-03-02 NOTE — Transfer of Care (Addendum)
Immediate Anesthesia Transfer of Care Note  Patient: Travis [name]  Procedure(s) Performed: RIGHT SHOULDER ARTHROSCOPY, EXTENSIVE DEBRIDEMENT, SUBACROMIAL DECOMPRESSION, DISTAL CLAVICLE EXCISION (Right: Shoulder)  Patient Location: PACU  Anesthesia Type:General  Level of Consciousness: drowsy and patient cooperative  Airway & Oxygen Therapy: Patient Spontanous Breathing and Patient connected to face mask oxygen  Post-op Assessment: Report given to RN and Post -op Vital signs reviewed and stable  Post vital signs: Reviewed and stable  Last Vitals:  Vitals Value Taken Time  BP 172/91 03/02/23 1437  Temp 36.2 C 03/02/23 1353  Pulse 66 03/02/23 1438  Resp 15 03/02/23 1438  SpO2 93 % 03/02/23 1438  Vitals shown include unfiled device data.  Last Pain:  Vitals:   03/02/23 1415  PainSc: 0-No pain      Patients Stated Pain Goal: 3 (03/02/23 1030)  Complications: No notable events documented.

## 2023-03-02 NOTE — Anesthesia Procedure Notes (Addendum)
Procedure Name: Intubation Date/Time: 03/02/2023 12:33 PM  Performed by: Alvera Novel, CRNAPre-anesthesia Checklist: Patient identified, Emergency Drugs available, Suction available and Patient being monitored Patient Re-evaluated:Patient Re-evaluated prior to induction Oxygen Delivery Method: Circle System Utilized Preoxygenation: Pre-oxygenation with 100% oxygen Induction Type: IV induction Ventilation: Oral airway inserted - appropriate to patient size Laryngoscope Size: Glidescope and 4 Grade View: Grade I Tube type: Oral Tube size: 7.5 mm Number of attempts: 1 Airway Equipment and Method: Stylet and Oral airway Placement Confirmation: ETT inserted through vocal cords under direct vision, positive ETCO2 and breath sounds checked- equal and bilateral Secured at: 22 cm Tube secured with: Tape Dental Injury: Teeth and Oropharynx as per pre-operative assessment  Difficulty Due To: Difficult Airway-  due to neck instability and Difficult Airway- due to dentition Comments: Elective glidescope go intubation due to prior neck surgery, neck pain, and poor dentition. Grade 1 view.

## 2023-03-02 NOTE — Progress Notes (Signed)
Assisted Dr. Oddono with right, interscalene, ultrasound guided block. Side rails up, monitors on throughout procedure. See vital signs in flow sheet. Tolerated Procedure well. 

## 2023-03-02 NOTE — Discharge Instructions (Addendum)
Post-operative patient instructions  Shoulder Arthroscopy   Ice:  Place intermittent ice or cooler pack over your shoulder, 30 minutes on and 30 minutes off.  Continue this for the first 72 hours after surgery, then save ice for use after therapy sessions or on more active days.   Weight:  You may bear weight on your arm as your symptoms allow. Motion:  Perform gentle shoulder motion as tolerated Dressing:  Perform 1st dressing change at 2 days postoperative. A moderate amount of blood tinged drainage is to be expected.  So if you bleed through the dressing on the first or second day or if you have fevers, it is fine to change the dressing/check the wounds early and redress wound.  If it bleeds through again, or if the incisions are leaking frank blood, please call the office. May change dressing every 1-2 days thereafter to help watch wounds. You may place regular band-aids over the incisions.   Shower:  Light shower is ok after 2 days.  Please take shower, NO bath. Recover with gauze and ace wrap to help keep wounds protected.   Pain medication:  A narcotic pain medication has been prescribed.  Take as directed.  Typically you need narcotic pain medication more regularly during the first 3 to 5 days after surgery.  Decrease your use of the medication as the pain improves.  Narcotics can sometimes cause constipation, even after a few doses.  If you have problems with constipation, you can take an over the counter stool softener or light laxative.  If you have persistent problems, please notify your physician's office. Physical therapy: Additional activity guidelines to be provided by your physician or physical therapist at follow-up visits.  Driving: Do not recommend driving x 2 weeks post surgical, especially if surgery performed on right side. Should not drive while taking narcotic pain medications. It typically takes at least 2 weeks to restore sufficient neuromuscular function for normal  reaction times for driving safety.  Call [phone] for questions or problems. Evenings you will be forwarded to the hospital operator.  Ask for the orthopaedic physician on call. Please call if you experience:    Redness, foul smelling, or persistent drainage from the surgical site  worsening shoulder pain and swelling not responsive to medication  any calf pain and or swelling of the lower leg  temperatures greater than 101.5 F other questions or concerns   Thank you for allowing [place] to be a part of your care.      Post Anesthesia Home Care Instructions  Activity: Get plenty of rest for the remainder of the day. A responsible individual must stay with you for 24 hours following the procedure.  For the next 24 hours, DO NOT: -Drive a car -Advertising copywriter -Drink alcoholic beverages -Take any medication unless instructed by your physician -Make any legal decisions or sign important papers.  Meals: Start with liquid foods such as gelatin or soup. Progress to regular foods as tolerated. Avoid greasy, spicy, heavy foods. If nausea and/or vomiting occur, drink only clear liquids until the nausea and/or vomiting subsides. Call your physician if vomiting continues.  Special Instructions/Symptoms: Your throat may feel dry or sore from the anesthesia or the breathing tube placed in your throat during surgery. If this causes discomfort, gargle with warm salt water. The discomfort should disappear within 24 hours.  If you had a scopolamine patch placed behind your ear for the management of post- operative nausea and/or vomiting:  1. The  medication in the patch is effective for 72 hours, after which it should be removed.  Wrap patch in a tissue and discard in the trash. Wash hands thoroughly with soap and water. 2. You may remove the patch earlier than 72 hours if you experience unpleasant side effects which may include dry mouth, dizziness or visual disturbances. 3. Avoid touching the  patch. Wash your hands with soap and water after contact with the patch.    Regional Anesthesia Blocks  1. You may not be able to move or feel the "blocked" extremity after a regional anesthetic block. This may last may last from 3-48 hours after placement, but it will go away. The length of time depends on the medication injected and your individual response to the medication. As the nerves start to wake up, you may experience tingling as the movement and feeling returns to your extremity. If the numbness and inability to move your extremity has not gone away after 48 hours, please call your surgeon.   2. The extremity that is blocked will need to be protected until the numbness is gone and the strength has returned. Because you cannot feel it, you will need to take extra care to avoid injury. Because it may be weak, you may have difficulty moving it or using it. You may not know what position it is in without looking at it while the block is in effect.  3. For blocks in the legs and feet, returning to weight bearing and walking needs to be done carefully. You will need to wait until the numbness is entirely gone and the strength has returned. You should be able to move your leg and foot normally before you try and bear weight or walk. You will need someone to be with you when you first try to ensure you do not fall and possibly risk injury.  4. Bruising and tenderness at the needle site are common side effects and will resolve in a few days.  5. Persistent numbness or new problems with movement should be communicated to the surgeon or the [facility] [phone] [facility] [phone]).   Information for Discharge Teaching: EXPAREL (bupivacaine liposome injectable suspension)   Pain relief is important to your recovery. The goal is to control your pain so you can move easier and return to your normal activities as soon as possible after your procedure. Your  physician may use several types of medicines to manage pain, swelling, and more.  Your surgeon or anesthesiologist gave you EXPAREL(bupivacaine) to help control your pain after surgery.  EXPAREL is a local anesthetic designed to release slowly over an extended period of time to provide pain relief by numbing the tissue around the surgical site. EXPAREL is designed to release pain medication over time and can control pain for up to 72 hours. Depending on how you respond to EXPAREL, you may require less pain medication during your recovery. EXPAREL can help reduce or eliminate the need for opioids during the first few days after surgery when pain relief is needed the most. EXPAREL is not an opioid and is not addictive. It does not cause sleepiness or sedation.   Important! A teal colored band has been placed on your arm with the date, time and amount of EXPAREL you have received. Please leave this armband in place for the full 96 hours following administration, and then you may remove the band. If you return to the hospital for any reason within 96 hours  following the administration of EXPAREL, the armband provides important information that your health care providers to know, and alerts them that you have received this anesthetic.    Possible side effects of EXPAREL: Temporary loss of sensation or ability to move in the area where medication was injected. Nausea, vomiting, constipation Rarely, numbness and tingling in your mouth or lips, lightheadedness, or anxiety may occur. Call your doctor right away if you think you may be experiencing any of these sensations, or if you have other questions regarding possible side effects.  Follow all other discharge instructions given to you by your surgeon or nurse. Eat a healthy diet and drink plenty of water or other fluids.

## 2023-03-02 NOTE — H&P (Signed)
PREOPERATIVE H&P  Chief Complaint: right shuolder partial rotator cuff tear, impingement, acromioclavicular arthritis  HPI: Travis [name] is a 62 y.o. male who presents for surgical treatment of right shuolder partial rotator cuff tear, impingement, acromioclavicular arthritis.  He denies any changes in medical history.  Past Surgical History:  Procedure Laterality Date   BIOPSY  06/14/2019   Procedure: BIOPSY;  Surgeon: Sherrilyn Rist, MD;  Location: [facility];  Service: Gastroenterology;;   CERVICAL DISC SURGERY     COLON SURGERY  05/03/2001   1/3 removed for diverticulitis   COLONOSCOPY     COLONOSCOPY WITH PROPOFOL N/A 06/14/2019   Procedure: COLONOSCOPY WITH PROPOFOL;  Surgeon: Sherrilyn Rist, MD;  Location: [facility];  Service: Gastroenterology;  Laterality: N/A;   EP IMPLANTABLE DEVICE N/A 05/01/2015   Procedure: Loop Recorder Insertion;  Surgeon: Duke Salvia, MD;  Location: [facility] CV [facility];  Service: Cardiovascular;  Laterality: N/A;   INGUINAL HERNIA REPAIR Bilateral    KNEE ARTHROSCOPY Right    SHOULDER ARTHROSCOPY Left 09/2022   SHOULDER ARTHROSCOPY Left 08/02/2014   Procedure: LEFT SHOULDER SCOPE DEBRIDEMENT/ACROMIOPLASTY;  Surgeon: Teryl Lucy, MD;  Location: [facility];  Service: Orthopedics;  Laterality: Left;  ANESTHESIA: GENERAL, PRE/POST OP SCALENE   TEE WITHOUT CARDIOVERSION N/A 03/03/2015   Procedure: TRANSESOPHAGEAL ECHOCARDIOGRAM (TEE);  Surgeon: Laqueta Linden, MD;  Location: [facility];  Service: Cardiology;  Laterality: N/A;   TRIGGER FINGER RELEASE Left 12/22/2022   Procedure: LEFT A-1 PULLEY RELEASE LONG FINGER;  Surgeon: Tarry Kos, MD;  Location: [facility];  Service: Orthopedics;  Laterality: Left;   UMBILICAL HERNIA REPAIR     with other hernia repair with mesh   WRIST SURGERY Right    fusion   Social History   Socioeconomic History   Marital status: Married    Spouse name: Not  on file   Number of children: 2   Years of education: College   Highest education level: Some college, no degree  Occupational History   Occupation: disabled  Tobacco Use   Smoking status: Former    Current packs/day: 0.00    Average packs/day: 1 pack/day for 8.0 years (8.0 ttl pk-yrs)    Types: Cigarettes    Start date: 06/07/1970    Quit date: 05/03/1978    Years since quitting: 44.8   Smokeless tobacco: Former    Types: Chew    Quit date: 05/03/1978   Tobacco comments:    Former smoker 07/19/22  Vaping Use   Vaping status: Never Used  Substance and Sexual Activity   Alcohol use: Yes    Alcohol/week: 1.0 standard drink of alcohol    Types: 1 Cans of beer per week    Comment: 1 beer every 6 months h/o heavy use in the past 07/19/22   Drug use: No   Sexual activity: Yes  Other Topics Concern   Not on file  Social History Narrative   Drinks some coffee, Drink diet sodas and tea   Social Determinants of Health   Financial Resource Strain: Low Risk  (10/10/2022)   Overall Financial Resource Strain (CARDIA)    Difficulty of Paying Living Expenses: Not hard at all  Food Insecurity: No Food Insecurity (10/10/2022)   Hunger Vital Sign    Worried About Running Out of Food in the Last Year: Never true    Ran Out of Food in the Last Year: Never true  Transportation Needs: No  Transportation Needs (10/10/2022)   PRAPARE - Administrator, Civil Service (Medical): No    Lack of Transportation (Non-Medical): No  Physical Activity: Unknown (10/10/2022)   Exercise Vital Sign    Days of Exercise per Week: 0 days    Minutes of Exercise per Session: Not on file  Stress: No Stress Concern Present (10/10/2022)   [employer] of Occupational Health - Occupational Stress Questionnaire    Feeling of Stress : Only a little  Social Connections: Moderately Integrated (10/10/2022)   Social Connection and Isolation Panel [NHANES]    Frequency of Communication with Friends and Family: More than  three times a week    Frequency of Social Gatherings with Friends and Family: Three times a week    Attends Religious Services: 1 to 4 times per year    Active Member of Clubs or Organizations: No    Attends Engineer, structural: Not on file    Marital Status: Married   Family History  Problem Relation Age of Onset   Stroke Father    Hyperlipidemia Father    Heart attack Sister 29   Stroke Sister    Dementia Mother    ALS Brother        age 17   Diabetes Maternal Grandfather    Colon cancer Neg Hx    Esophageal cancer Neg Hx    Stomach cancer Neg Hx    Rectal cancer Neg Hx    Allergies  Allergen Reactions   Lisinopril Cough    Patient/spouse is not aware/familiar with why this is listed as an allergy Not a true allergy but on this list to avoid ACE inhibitors-SA Luking MD   Prior to Admission medications   Medication Sig Start Date End Date Taking? Authorizing Provider  ALPRAZolam (XANAX) 0.25 MG tablet Take 0.5 tablets (0.125 mg total) by mouth in the morning AND 1 tablet (0.25 mg total) daily in the afternoon AND 1 tablet (0.25 mg total) every evening. Take as needed 10/12/22  Yes Luking, Jonna Coup, MD  apixaban (ELIQUIS) 5 MG TABS tablet Take 1 tablet (5 mg total) by mouth 2 (two) times daily. 07/19/22  Yes Fenton, Clint R, PA  atorvastatin (LIPITOR) 20 MG tablet Take 1 tablet (20 mg total) by mouth daily. 11/30/22  Yes Babs Sciara, MD  Cholecalciferol 50 MCG (2000 UT) CAPS Take 1 capsule (2,000 Units total) by mouth daily with breakfast. 05/17/18  Yes Nida, Denman George, MD  Continuous Glucose Sensor (DEXCOM G7 SENSOR) MISC Inject into the skin as directed to check continuous blood sugar. Change sensor every 10 days as directed. 12/07/22  Yes Dani Gobble, NP  FLUoxetine (PROZAC) 20 MG capsule Take 1 capsule by mouth daily. 11/30/22  Yes Luking, Jonna Coup, MD  fluticasone (FLONASE) 50 MCG/ACT nasal spray Place 1 spray into both nostrils daily. 04/09/22  Yes Babs Sciara, MD  folic acid (FOLVITE) 1 MG tablet Take 1 tablet (1 mg) by mouth daily. 11/22/22 03/09/23 Yes Luking, Jonna Coup, MD  gabapentin (NEURONTIN) 300 MG capsule Take 1 capsule (300 mg total) by mouth every morning AND 1 capsule (300 mg total) daily after lunch AND 2 capsules (600 mg total) at bedtime. 02/09/23 02/09/24 Yes McCue, Shanda Bumps, NP  insulin degludec (TRESIBA FLEXTOUCH) 100 UNIT/ML FlexTouch Pen Inject 30 Units into the skin at bedtime. 12/07/22  Yes Dani Gobble, NP  metFORMIN (GLUCOPHAGE) 500 MG tablet Take 1 tablet (500 mg total) by mouth 2 (two)  times daily with a meal. 12/07/22  Yes Reardon, Freddi Starr, NP  Multiple Vitamin (MULTIVITAMIN WITH MINERALS) TABS tablet Take 1 tablet daily by mouth.   Yes [provider]  valACYclovir (VALTREX) 1000 MG tablet Take 1 tablet by mouth daily. 10/12/22  Yes Babs Sciara, MD  Accu-Chek FastClix Lancets MISC Use to check blood sugar twice daily. 06/25/22 06/25/23  Dani Gobble, NP  Blood Glucose Monitoring Suppl (FREESTYLE LITE) w/Device KIT Use to check glucose twice daily 06/29/22   Dani Gobble, NP  Blood Pressure Monitoring (OMRON 3 SERIES BP MONITOR) DEVI USE AS DIRECTED. 11/28/20   Andrez Grime, RPH  COVID-19 mRNA vaccine [id number] (COMIRNATY) syringe Inject into the muscle. 05/07/22   Judyann Munson, MD  glucose blood (ACCU-CHEK GUIDE) test strip USE AS DIRECTED TO TEST BLOOD SUGAR 2 TIMES DAILY AS DIRECTED 06/25/22 06/25/23  Dani Gobble, NP  HYDROcodone-acetaminophen (NORCO) 5-325 MG tablet Take 1 tablet by mouth every 6 (six) hours as needed. To be taken after surgery 02/22/23   Cristie Hem, PA-C  Insulin Pen Needle (COMFORT EZ PEN NEEDLES) 31G X 6 MM MISC USE TO INJECT INSULIN DAILY AS DIRECTED 06/28/22   Dani Gobble, NP  Insulin Pen Needle (UNIFINE PENTIPS) 31G X 6 MM MISC USE TO INJECT INSULIN DAILY AS DIRECTED 04/01/22   Dani Gobble, NP  ondansetron (ZOFRAN) 4 MG tablet Take 1 tablet (4 mg  total) by mouth every 8 (eight) hours as needed for nausea or vomiting. 02/22/23   Cristie Hem, PA-C  RSV vaccine recomb adjuvanted (AREXVY) 120 MCG/0.5ML injection Inject into the muscle. 05/18/22     Insulin Glargine (BASAGLAR KWIKPEN) 100 UNIT/ML INJECT 50 UNITS INTO THE SKIN AT BEDTIME 06/20/20 07/15/20  Dani Gobble, NP     Positive ROS: All other systems have been reviewed and were otherwise negative with the exception of those mentioned in the HPI and as above.  Physical Exam: General: Alert, no acute distress Cardiovascular: No pedal edema Respiratory: No cyanosis, no use of accessory musculature GI: abdomen soft Skin: No lesions in the area of chief complaint Neurologic: Sensation intact distally Psychiatric: Patient is competent for consent with normal mood and affect Lymphatic: no lymphedema  MUSCULOSKELETAL: exam stable  Assessment: right shuolder partial rotator cuff tear, impingement, acromioclavicular arthritis  Plan: Plan for Procedure(s): RIGHT SHOULDER ARTHROSCOPY, EXTENSIVE DEBRIDEMENT, SUBACROMIAL DECOMPRESSION, DISTAL CLAVICLE EXCISION  The risks benefits and alternatives were discussed with the patient including but not limited to the risks of nonoperative treatment, versus surgical intervention including infection, bleeding, nerve injury,  blood clots, cardiopulmonary complications, morbidity, mortality, among others, and they were willing to proceed.   Glee Arvin, MD 03/02/2023 10:18 AM

## 2023-03-02 NOTE — Op Note (Signed)
   Date of Surgery: 03/02/2023  INDICATIONS: The patient is a 62 year old male with right shoulder pain that has failed conservative treatment;  The patient did consent to the procedure after discussion of the risks and benefits.  DIAGNOSES: Right shoulder, chronic rotator cuff tear, SLAP tear, AC arthritis, and subacromial impingement.  POST-OPERATIVE DIAGNOSIS: same  PROCEDURE: Arthroscopic extensive debridement - 29823 Subdeltoid Bursa, Supraspinatus Tendon, Anterior Labrum, Superior Labrum, and Posterior Labrum Arthroscopic distal clavicle excision - 16109 Arthroscopic subacromial decompression - 60454   OPERATIVE FINDING: Exam under anesthesia: Normal Articular space: Normal Chondral surfaces: Normal Biceps: Ruptured Subscapularis: Intact  Supraspinatus: Partial thickness tear 40% Infraspinatus: Moderate tendinosis Severe acromioclavicular arthritis Unfavorable acromial anatomy  SURGEON: N. Glee Arvin, M.D.  ASSIST: Oneal Grout, PA-C  ANESTHESIA:  general, regional  IV FLUIDS AND URINE: See anesthesia.  ESTIMATED BLOOD LOSS: minimal mL.  IMPLANTS: None  COMPLICATIONS: None.  DESCRIPTION OF PROCEDURE: The patient was brought to the operating room and placed supine on the operating table.  The patient had been signed prior to the procedure and this was documented. The patient had the anesthesia placed by the anesthesiologist.  A time-out was performed to confirm that this was the correct patient, site, side and location. The patient did receive antibiotics prior to the incision and was re-dosed during the procedure as needed at indicated intervals.  The patient was then positioned into the beach chair position with all bony prominences well padded and neutral C spine. The patient had the operative extremity prepped and draped in the standard surgical fashion.    Bony landmarks were palpated and marked.  Shoulder arthroscopy portals were created.  Diagnostic  shoulder arthroscopy was performed.  Biceps tendon had completely ruptured.  Superior labral stump was debrided back to stable margins.  There was degenerative tears of the anterior and posterior labrum as well that were gently debrided back to stable margins.  The chondral surfaces were unremarkable.  There is about a 40% partial-thickness articular surface tear of the supraspinatus.  This was debrided back to stable margins.  Subscapularis tendon was unremarkable.  Slight synovitis within the rotator interval.  Arthroscope was then repositioned in the subacromial space.  There was extensive bursitis that was thickened and adherent.  Bursectomy was performed.  An additional lateral subacromial portal was created.  Unfavorable acromial anatomy was encountered.  Subacromial decompression acromioplasty and CA ligament release were performed.  There was mild tendinosis of the bursal surface of the supraspinatus and infraspinatus.  The [facility] joint was then identified.  There was severe arthritis.  Large inferior bulky osteophytes were resected with a bur.  Distal clavicle excision was performed.  Approximately 8 mm of distal clavicle was removed.  Couple of millimeters of the acromion was resected from the [facility] joint to aid in visualization and the distal clavicle excision.  Excess fluid was expressed and incisions were closed with nylon.  Sterile dressings were applied.  Regular sling was placed.  Patient tolerated procedure well had no immediate complications. Tessa Lerner, my PA, was a medical necessity for the entirety of the surgery including opening, closing, limb positioning, retracting, exposing, and repairing.  POSTOPERATIVE PLAN: Patient will be discharged home and follow-up in 1 week for suture removal.  He may advance activity as tolerated.  Mayra Reel, MD [facility] 12:13 PM

## 2023-03-02 NOTE — Progress Notes (Signed)
Called Dr. Roda Shutters to clarify when pt was to resume elequis. Instructed pt to resume elequis tomorrow per Dr. Roda Shutters.

## 2023-03-02 NOTE — Anesthesia Preprocedure Evaluation (Addendum)
Anesthesia Evaluation  Patient identified by MRN, date of birth, ID band Patient awake    Reviewed: Allergy & Precautions, H&P , NPO status , Patient's Chart, lab work & pertinent test results  History of Anesthesia Complications Negative for: history of anesthetic complications  Airway Mallampati: II  TM Distance: >3 FB Neck ROM: Full    Dental no notable dental hx. (+) Dental Advisory Given, Poor Dentition, Chipped, Missing   Pulmonary neg pulmonary ROS, sleep apnea , former smoker   Pulmonary exam normal breath sounds clear to auscultation       Cardiovascular hypertension, Pt. on medications negative cardio ROS Normal cardiovascular exam+ dysrhythmias Atrial Fibrillation  Rhythm:Regular Rate:Normal     Neuro/Psych  PSYCHIATRIC DISORDERS Anxiety Depression     Neuromuscular disease CVA, No Residual Symptoms negative neurological ROS  negative psych ROS   GI/Hepatic negative GI ROS, Neg liver ROS,,,  Endo/Other  negative endocrine ROSdiabetes, Well Controlled, Type 2, Insulin Dependent, Oral Hypoglycemic Agents    Renal/GU negative Renal ROS  negative genitourinary   Musculoskeletal negative musculoskeletal ROS (+) Arthritis , Osteoarthritis,    Abdominal   Peds negative pediatric ROS (+)  Hematology negative hematology ROS (+)  On eliquis    Anesthesia Other Findings All: lisonopril  Reproductive/Obstetrics negative OB ROS                              Anesthesia Physical Anesthesia Plan  ASA: 3  Anesthesia Plan: General   Post-op Pain Management: Regional block* and Tylenol PO (pre-op)*   Induction: Intravenous  PONV Risk Score and Plan: 2 and Treatment may vary due to age or medical condition, Ondansetron, Dexamethasone and Midazolam  Airway Management Planned: Oral ETT  Additional Equipment: None  Intra-op Plan:   Post-operative Plan: Extubation in OR  Informed  Consent: I have reviewed the patients History and Physical, chart, labs and discussed the procedure including the risks, benefits and alternatives for the proposed anesthesia with the patient or authorized representative who has indicated his/her understanding and acceptance.     Dental advisory given  Plan Discussed with: CRNA and Anesthesiologist  Anesthesia Plan Comments:         Anesthesia Quick Evaluation

## 2023-03-11 ENCOUNTER — Ambulatory Visit (INDEPENDENT_AMBULATORY_CARE_PROVIDER_SITE_OTHER): Payer: 59 | Admitting: Physician Assistant

## 2023-03-11 ENCOUNTER — Encounter: Payer: Self-pay | Admitting: Physician Assistant

## 2023-03-11 ENCOUNTER — Other Ambulatory Visit (HOSPITAL_COMMUNITY): Payer: Self-pay

## 2023-03-11 ENCOUNTER — Other Ambulatory Visit: Payer: Self-pay

## 2023-03-11 DIAGNOSIS — Z9889 Other specified postprocedural states: Secondary | ICD-10-CM

## 2023-03-11 MED ORDER — TRAMADOL HCL 50 MG PO TABS
50.0000 mg | ORAL_TABLET | Freq: Three times a day (TID) | ORAL | 1 refills | Status: DC | PRN
  Filled 2023-03-11: qty 30, 10d supply, fill #0
  Filled 2023-04-08: qty 30, 10d supply, fill #1

## 2023-03-11 NOTE — Progress Notes (Signed)
Post-Op Visit Note   Patient: Travis [name]           Date of Birth: 10-Dec-1960           MRN: [id number] Visit Date: 03/11/2023 PCP: Babs Sciara, MD   Assessment & Plan:  Chief Complaint:  Chief Complaint  Patient presents with   Right Shoulder - Follow-up    Right shoulder scope 03/02/2023   Visit Diagnoses:  1. History of arthroscopy of right shoulder     Plan: Patient is a pleasant 62 year old gentleman who comes in today 1 week status post right shoulder arthroscopic debridement decompression and distal clavicle excision 03/02/2023.  He has been doing okay.  He has stopped taking narcotics due to side effects.  Examination of his right shoulder reveals fully healed surgical portals with nylon sutures in place.  No evidence of infection or cellulitis.  Today, sutures were removed and Steri-Strips applied.  I sent in a prescription for tramadol.  Have also sent an outpatient referral for physical therapy per cranioplasty protocol.  Follow-up in 5 weeks for recheck.  Call with concerns or questions.  Follow-Up Instructions: Return in about 5 weeks (around 04/15/2023).   Orders:  Orders Placed This Encounter  Procedures   Ambulatory referral to Physical Therapy   No orders of the defined types were placed in this encounter.   Imaging: No new imaging  PMFS History: Patient Active Problem List   Diagnosis Date Noted   Impingement syndrome of right shoulder 01/25/2023   Nontraumatic incomplete tear of right rotator cuff 01/25/2023   Stenosing tenosynovitis of finger of left hand 12/22/2022   Hypercoagulable state due to paroxysmal atrial fibrillation (HCC) 07/19/2022   Nontraumatic incomplete tear of left rotator cuff 07/07/2022   Arthritis of left acromioclavicular joint 07/07/2022   Right groin hernia 03/03/2021   Hyperlipidemia associated with type 2 diabetes mellitus (HCC) 12/25/2020   Tendinopathy of right rotator cuff 09/25/2020   Arthritis of right  acromioclavicular joint 09/25/2020   Adrenal adenoma, left 09/07/2019   Chronic anticoagulation    GI bleed 06/14/2019   Vitamin D deficiency 04/18/2019   History of stroke 04/20/2018   GAD (generalized anxiety disorder) 05/15/2016   Acute bronchiolitis due to respiratory syncytial virus (RSV)    Acute CVA (cerebrovascular accident) (HCC) 05/04/2016   Dizziness 05/02/2016   Anxiety and depression 05/02/2016   Paroxysmal atrial fibrillation (HCC) 03/02/2016   PFO (patent foramen ovale) 07/24/2015   OSA (obstructive sleep apnea) 04/18/2015   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 artery (HCC) 04/18/2015   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   Snoring 01/23/2015   Degeneration of cervical intervertebral disc 01/23/2015   Essential hypertension 12/14/2014   DM type 2 causing vascular disease (HCC) 12/14/2014   Neck pain 12/14/2014   Cerebral infarction, chronic    Multi-infarct state 10/14/2014   Impingement syndrome of left shoulder 08/02/2014   Adhesive capsulitis of left shoulder 08/02/2014   Rectal bleeding 03/13/2014   Diabetes type 2, controlled (HCC) 02/18/2014   Diabetic peripheral neuropathy (HCC) 03/28/2013   Irreducible incisional hernia 11/11/2010   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   Anxiety    Arthritis    Blood transfusion without reported diagnosis    Complication of anesthesia    bleed after last shoulder-had to stay overnight   Depression    Diabetes mellitus  Diabetic peripheral neuropathy (HCC) 03/28/2013   Diverticulosis    Dizziness    Hernia, inguinal    Hyperlipidemia    Hypertension    Impingement syndrome of left shoulder 08/02/2014   Ischemic colitis (HCC)    Multi-infarct state 10/14/2014   Neuropathy of lower extremity    Night sweats    every once in a while   Sleep apnea    no CPAP   Stroke (HCC) 02/2015   no  deficits   Syncope and collapse     Family History  Problem Relation Age of Onset   Stroke Father    Hyperlipidemia Father    Heart attack Sister 101   Stroke Sister    Dementia Mother    ALS Brother        age 32   Diabetes Maternal Grandfather    Colon cancer Neg Hx    Esophageal cancer Neg Hx    Stomach cancer Neg Hx    Rectal cancer Neg Hx     Past Surgical History:  Procedure Laterality Date   BIOPSY  06/14/2019   Procedure: BIOPSY;  Surgeon: Sherrilyn Rist, MD;  Location: [facility];  Service: Gastroenterology;;   CERVICAL DISC SURGERY     COLON SURGERY  05/03/2001   1/3 removed for diverticulitis   COLONOSCOPY     COLONOSCOPY WITH PROPOFOL N/A 06/14/2019   Procedure: COLONOSCOPY WITH PROPOFOL;  Surgeon: Sherrilyn Rist, MD;  Location: [facility];  Service: Gastroenterology;  Laterality: N/A;   EP IMPLANTABLE DEVICE N/A 05/01/2015   Procedure: Loop Recorder Insertion;  Surgeon: Duke Salvia, MD;  Location: [facility] INVASIVE CV LAB;  Service: Cardiovascular;  Laterality: N/A;   INGUINAL HERNIA REPAIR Bilateral    KNEE ARTHROSCOPY Right    SHOULDER ARTHROSCOPY Left 09/2022   SHOULDER ARTHROSCOPY Left 08/02/2014   Procedure: LEFT SHOULDER SCOPE DEBRIDEMENT/ACROMIOPLASTY;  Surgeon: Teryl Lucy, MD;  Location: [facility];  Service: Orthopedics;  Laterality: Left;  ANESTHESIA: GENERAL, PRE/POST OP SCALENE   TEE WITHOUT CARDIOVERSION N/A 03/03/2015   Procedure: TRANSESOPHAGEAL ECHOCARDIOGRAM (TEE);  Surgeon: Laqueta Linden, MD;  Location: [facility];  Service: Cardiology;  Laterality: N/A;   TRIGGER FINGER RELEASE Left 12/22/2022   Procedure: LEFT A-1 PULLEY RELEASE LONG FINGER;  Surgeon: Tarry Kos, MD;  Location: [facility];  Service: Orthopedics;  Laterality: Left;   UMBILICAL HERNIA REPAIR     with other hernia repair with mesh   WRIST SURGERY Right    fusion   Social History   Occupational History   Occupation:  disabled  Tobacco Use   Smoking status: Former    Current packs/day: 0.00    Average packs/day: 1 pack/day for 8.0 years (8.0 ttl pk-yrs)    Types: Cigarettes    Start date: 06/07/1970    Quit date: 05/03/1978    Years since quitting: 44.8   Smokeless tobacco: Former    Types: Chew    Quit date: 05/03/1978   Tobacco comments:    Former smoker 07/19/22  Vaping Use   Vaping status: Never Used  Substance and Sexual Activity   Alcohol use: Yes    Alcohol/week: 1.0 standard drink of alcohol    Types: 1 Cans of beer per week    Comment: 1 beer every 6 months h/o heavy use in the past 07/19/22   Drug use: No   Sexual activity: Yes

## 2023-03-14 ENCOUNTER — Other Ambulatory Visit: Payer: Self-pay

## 2023-03-15 ENCOUNTER — Other Ambulatory Visit: Payer: Self-pay | Admitting: Physician Assistant

## 2023-03-15 ENCOUNTER — Other Ambulatory Visit: Payer: Self-pay

## 2023-03-15 DIAGNOSIS — Z9889 Other specified postprocedural states: Secondary | ICD-10-CM

## 2023-03-19 ENCOUNTER — Other Ambulatory Visit (HOSPITAL_COMMUNITY): Payer: Self-pay

## 2023-03-19 ENCOUNTER — Other Ambulatory Visit: Payer: Self-pay | Admitting: Family Medicine

## 2023-03-21 MED FILL — Folic Acid Tab 1 MG: ORAL | 30 days supply | Qty: 30 | Fill #0 | Status: AC

## 2023-03-22 ENCOUNTER — Other Ambulatory Visit: Payer: Self-pay

## 2023-03-22 ENCOUNTER — Other Ambulatory Visit (HOSPITAL_COMMUNITY): Payer: Self-pay

## 2023-03-23 ENCOUNTER — Encounter (HOSPITAL_COMMUNITY): Payer: Self-pay | Admitting: Occupational Therapy

## 2023-03-23 ENCOUNTER — Other Ambulatory Visit: Payer: Self-pay

## 2023-03-23 ENCOUNTER — Ambulatory Visit (HOSPITAL_COMMUNITY): Payer: 59 | Attending: Family Medicine | Admitting: Occupational Therapy

## 2023-03-23 DIAGNOSIS — M25511 Pain in right shoulder: Secondary | ICD-10-CM | POA: Diagnosis not present

## 2023-03-23 DIAGNOSIS — R29898 Other symptoms and signs involving the musculoskeletal system: Secondary | ICD-10-CM | POA: Diagnosis not present

## 2023-03-23 DIAGNOSIS — Z9889 Other specified postprocedural states: Secondary | ICD-10-CM | POA: Diagnosis not present

## 2023-03-23 NOTE — Therapy (Signed)
[facility] Outpatient Rehabilitation at [facility] [address] [place], [place], [address] Phone: [phone]   Fax:  [phone]  Patient Details  Name: ORR VISCOMI MRN: [id number] Date of Birth: 1960-09-16 Referring Provider:  No ref. provider found  Encounter Date: 03/23/2023   OCCUPATIONAL THERAPY DISCHARGE SUMMARY  Visits from Start of Care: 15  Current functional level related to goals / functional outcomes: Pt did not return after last visit on 11/26/22.    Remaining deficits: unknown   Education / Equipment: HEP   Patient agrees to discharge. Patient goals were partially met. Patient is being discharged due to not returning since the last visit.Marland Kitchen     Ezra Sites, OTR/L  [phone] 03/23/2023, 1:05 PM  [place] [facility] Outpatient Rehabilitation at [facility] [address] [place], [place], [address] Phone: [phone]   Fax:  [phone]

## 2023-03-23 NOTE — Therapy (Signed)
OUTPATIENT OCCUPATIONAL THERAPY ORTHO EVALUATION  Patient Name: Travis [name] MRN: [id number] DOB:Mar 07, 1961, 62 y.o., male Today's Date: 03/23/2023    END OF SESSION:  OT End of Session - 03/23/23 1342     Visit Number 1    Number of Visits 6    Date for OT Re-Evaluation 05/04/23    Authorization Type [employer]-$40 copay    OT Start Time 1304    OT Stop Time 1345    OT Time Calculation (min) 41 min    Activity Tolerance Patient tolerated treatment well    Behavior During Therapy [facility] for tasks assessed/performed             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   Anxiety    Arthritis    Blood transfusion without reported diagnosis    Complication of anesthesia    bleed after last shoulder-had to stay overnight   Depression    Diabetes mellitus    Diabetic peripheral neuropathy (HCC) 03/28/2013   Diverticulosis    Dizziness    Hernia, inguinal    Hyperlipidemia    Hypertension    Impingement syndrome of left shoulder 08/02/2014   Ischemic colitis (HCC)    Multi-infarct state 10/14/2014   Neuropathy of lower extremity    Night sweats    every once in a while   Sleep apnea    no CPAP   Stroke (HCC) 02/2015   no deficits   Syncope and collapse    Past Surgical History:  Procedure Laterality Date   BIOPSY  06/14/2019   Procedure: BIOPSY;  Surgeon: Sherrilyn Rist, MD;  Location: [facility];  Service: Gastroenterology;;   CERVICAL DISC SURGERY     COLON SURGERY  05/03/2001   1/3 removed for diverticulitis   COLONOSCOPY     COLONOSCOPY WITH PROPOFOL N/A 06/14/2019   Procedure: COLONOSCOPY WITH PROPOFOL;  Surgeon: Sherrilyn Rist, MD;  Location: [facility];  Service: Gastroenterology;  Laterality: N/A;   EP IMPLANTABLE DEVICE N/A 05/01/2015   Procedure: Loop Recorder Insertion;  Surgeon: Duke Salvia, MD;  Location: [facility] INVASIVE CV LAB;   Service: Cardiovascular;  Laterality: N/A;   INGUINAL HERNIA REPAIR Bilateral    KNEE ARTHROSCOPY Right    SHOULDER ARTHROSCOPY Left 09/2022   SHOULDER ARTHROSCOPY Left 08/02/2014   Procedure: LEFT SHOULDER SCOPE DEBRIDEMENT/ACROMIOPLASTY;  Surgeon: Teryl Lucy, MD;  Location: [facility];  Service: Orthopedics;  Laterality: Left;  ANESTHESIA: GENERAL, PRE/POST OP SCALENE   TEE WITHOUT CARDIOVERSION N/A 03/03/2015   Procedure: TRANSESOPHAGEAL ECHOCARDIOGRAM (TEE);  Surgeon: Laqueta Linden, MD;  [facility]: [facility];  Service: Cardiology;  Laterality: N/A;   TRIGGER FINGER RELEASE Left 12/22/2022   Procedure: LEFT A-1 PULLEY RELEASE LONG FINGER;  Surgeon: Tarry Kos, MD;  Location: [facility];  Service: Orthopedics;  Laterality: Left;   UMBILICAL HERNIA REPAIR     with other hernia repair with mesh   WRIST SURGERY Right    fusion   Patient Active Problem List   Diagnosis Date Noted   Impingement syndrome of right shoulder 01/25/2023   Nontraumatic incomplete tear of right rotator cuff 01/25/2023   Stenosing tenosynovitis of finger of left hand 12/22/2022   Hypercoagulable state due to paroxysmal atrial fibrillation (HCC) 07/19/2022   Nontraumatic incomplete tear of left rotator cuff 07/07/2022   Arthritis of left  acromioclavicular joint 07/07/2022   Right groin hernia 03/03/2021   Hyperlipidemia associated with type 2 diabetes mellitus (HCC) 12/25/2020   Tendinopathy of right rotator cuff 09/25/2020   Arthritis of right acromioclavicular joint 09/25/2020   Adrenal adenoma, left 09/07/2019   Chronic anticoagulation    GI bleed 06/14/2019   Vitamin D deficiency 04/18/2019   History of stroke 04/20/2018   GAD (generalized anxiety disorder) 05/15/2016   Acute bronchiolitis due to respiratory syncytial virus (RSV)    Acute CVA (cerebrovascular accident) (HCC) 05/04/2016   Dizziness 05/02/2016   Anxiety and depression 05/02/2016   Paroxysmal  atrial fibrillation (HCC) 03/02/2016   PFO (patent foramen ovale) 07/24/2015   OSA (obstructive sleep apnea) 04/18/2015   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 Lilyan Punt REFERRING PROVIDER: Dr. Gershon Mussel  ONSET DATE: 03/02/23  REFERRING DIAG: U98.119 (ICD-10-CM) - S/P arthroscopy of shoulder   THERAPY DIAG:  Acute pain of right shoulder  Other symptoms and signs involving the musculoskeletal system  Rationale for Evaluation and Treatment: Rehabilitation  SUBJECTIVE:   SUBJECTIVE STATEMENT: S: It's been doing well it's just sore.  Pt accompanied by: self  PERTINENT HISTORY: Pt is a 62 y/o male s/p right shoulder arthroscopy on 03/02/23 for extensive debridement, distal clavicle excision, and subacromial decompression. Marland Kitchen Pt presents without sling, reports he has been trying to move his arm. Pt reports high soreness, has been driving a zero turn lawnmower a little.   PRECAUTIONS: ShoulderProgress as tolerated: P/ROM to AA/ROM to A/ROM  WEIGHT BEARING RESTRICTIONS: No  PAIN:  Are you having pain? Yes: NPRS scale: 7/10 Pain location: right shoulder Pain description: sore Aggravating factors: movement Relieving factors: heat  FALLS: Has patient fallen in last 6 months? No  PLOF: Independent  PATIENT GOALS: To be able to use his RUE normally.    OBJECTIVE:  Note: Objective measures were completed at Evaluation unless otherwise noted.  HAND DOMINANCE: Right  ADLs: Overall ADLs: Pt is having difficulty with reaching into overhead cabinets and behind back. Putting his belt through a loop is difficult, has difficulty reaching into microwave and dryer. Pt requires assistance with pull-over shirts. Pt with mod difficulty sleeping.   FUNCTIONAL OUTCOME MEASURES: FOTO: 53/100  UPPER EXTREMITY ROM:       Pt assessed in sitting, er/IR adducted  Active ROM Right eval  Shoulder flexion 110  Shoulder abduction 126  Shoulder internal rotation 90  Shoulder external rotation 58  (Blank rows = not tested)   UPPER EXTREMITY MMT:       Pt assessed via observations due to protocol  MMT Right eval  Shoulder flexion 3/5  Shoulder abduction 3/5  Shoulder internal rotation 3/5  Shoulder external rotation 3/5  (Blank rows = not tested)   SENSATION: WFL  EDEMA: None  COGNITION: Overall cognitive status: Within functional limits for tasks assessed  OBSERVATIONS: mild/mod fascial restrictions in right shoulder, trapezius, and scapular regions  TODAY'S TREATMENT:                                                                                                                              DATE:  03/23/23 -A/ROM: supine-protraction, flexion, abduction, er, horizontal abduction, 10 reps -A/ROM: standing-protraction, flexion, abduction, er, horizontal abduction, 10 reps   PATIENT EDUCATION: Education details: A/ROM Person educated: Patient Education method: Programmer, multimedia, Facilities manager, and Verbal cues Education comprehension: verbalized understanding and returned demonstration  HOME EXERCISE PROGRAM: Eval: A/ROM  GOALS: Goals reviewed with patient? Yes  SHORT TERM GOALS: Target date: 04/13/23  Pt will be provided with and educated on HEP to improve mobility in RUE required for use during ADL completion.   Goal status:  INITIAL  2.  Pt will decrease RUE fascial restrictions to min amounts or less to improve mobility required for functional reaching tasks.   Goal status: INITIAL   LONG TERM GOALS: Target date: 05/04/23  Pt will decrease pain in RUE to 4/10 or less to improve ability to sleep for 2+ consecutive hours without waking due to pain.   Goal status: INITIAL  2.  Pt will increase RUE A/ROM by 25 degrees or greater to improve ability to use RUE when reaching overhead or behind back during dressing and bathing tasks.   Goal status: INITIAL  3.  Pt will increase RUE strength to 4/5 or greater to improve ability to use RUE when lifting or carrying items during meal preparation/housework/yardwork tasks.   Goal status: INITIAL    ASSESSMENT:  CLINICAL IMPRESSION: Patient is a 62 y.o. male who was seen today for occupational therapy evaluation s/p right shoulder arthroscopy on 03/02/23. Pt reports he did not wear a sling and has been trying to reach and stretch at home, using zero-turn lawnmower. Pt demonstrating improving ROM, reports this shoulder is less painful than the left shoulder. Pt continues to have a chronic RC tear in the right shoulder.  Cautioned pt on doing too much too soon, such as lifting/pulling/pushing.   PERFORMANCE DEFICITS: in functional skills including ADLs, IADLs, ROM, strength, pain, fascial restrictions, and UE functional use  IMPAIRMENTS: are limiting patient from ADLs, IADLs, rest and sleep, and leisure.   COMORBIDITIES: has no other co-morbidities that affects occupational performance. Patient will benefit from skilled OT to address above impairments and improve overall function.  MODIFICATION OR ASSISTANCE TO COMPLETE EVALUATION: No modification of tasks or assist necessary to complete an evaluation.  OT OCCUPATIONAL PROFILE AND HISTORY: Problem focused assessment: Including review of records relating to presenting problem.  CLINICAL DECISION MAKING: LOW - limited  treatment options, no task modification necessary  REHAB POTENTIAL: Good  EVALUATION COMPLEXITY: Low      PLAN:  OT FREQUENCY: 1x/week  OT DURATION: 6 weeks  PLANNED INTERVENTIONS: 97168 OT Re-evaluation, 97535 self care/ADL training, 29562 therapeutic exercise, 97530 therapeutic activity, 97140 manual therapy, 97035 ultrasound, 97014 electrical stimulation unattended, patient/family education, and DME and/or AE instructions  RECOMMENDED  OTHER SERVICES: None  CONSULTED AND AGREED WITH PLAN OF CARE: Patient  PLAN FOR NEXT SESSION: Follow up on HEP, manual techniques prn, passive stretching, A/ROM, scapular mobility and strengthening   Ezra Sites, OTR/L  [phone] 03/23/2023, 1:45 PM

## 2023-03-23 NOTE — Patient Instructions (Signed)

## 2023-03-29 ENCOUNTER — Encounter (HOSPITAL_COMMUNITY): Payer: Self-pay | Admitting: Occupational Therapy

## 2023-03-29 ENCOUNTER — Ambulatory Visit (HOSPITAL_COMMUNITY): Payer: 59 | Admitting: Occupational Therapy

## 2023-03-29 DIAGNOSIS — M25511 Pain in right shoulder: Secondary | ICD-10-CM | POA: Diagnosis not present

## 2023-03-29 DIAGNOSIS — Z9889 Other specified postprocedural states: Secondary | ICD-10-CM | POA: Diagnosis not present

## 2023-03-29 DIAGNOSIS — R29898 Other symptoms and signs involving the musculoskeletal system: Secondary | ICD-10-CM | POA: Diagnosis not present

## 2023-03-29 NOTE — Therapy (Signed)
OUTPATIENT OCCUPATIONAL THERAPY ORTHO TREATMENT NOTE  Patient Name: Travis [name] MRN: [id number] DOB:02/17/1961, 62 y.o., male Today's Date: 03/29/2023    END OF SESSION:  OT End of Session - 03/29/23 1102     Visit Number 2    Number of Visits 6    Date for OT Re-Evaluation 05/04/23    Authorization Type [employer] Employee-$40 copay    OT Start Time 1021    OT Stop Time 1102    OT Time Calculation (min) 41 min    Activity Tolerance Patient tolerated treatment well    Behavior During Therapy [facility] for tasks assessed/performed             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   Anxiety    Arthritis    Blood transfusion without reported diagnosis    Complication of anesthesia    bleed after last shoulder-had to stay overnight   Depression    Diabetes mellitus    Diabetic peripheral neuropathy (HCC) 03/28/2013   Diverticulosis    Dizziness    Hernia, inguinal    Hyperlipidemia    Hypertension    Impingement syndrome of left shoulder 08/02/2014   Ischemic colitis (HCC)    Multi-infarct state 10/14/2014   Neuropathy of lower extremity    Night sweats    every once in a while   Sleep apnea    no CPAP   Stroke (HCC) 02/2015   no deficits   Syncope and collapse    Past Surgical History:  Procedure Laterality Date   BIOPSY  06/14/2019   Procedure: BIOPSY;  Surgeon: Sherrilyn Rist, MD;  Location: [facility];  Service: Gastroenterology;;   CERVICAL DISC SURGERY     COLON SURGERY  05/03/2001   1/3 removed for diverticulitis   COLONOSCOPY     COLONOSCOPY WITH PROPOFOL N/A 06/14/2019   Procedure: COLONOSCOPY WITH PROPOFOL;  Surgeon: Sherrilyn Rist, MD;  Location: [facility];  Service: Gastroenterology;  Laterality: N/A;   EP IMPLANTABLE DEVICE N/A 05/01/2015   Procedure: Loop Recorder Insertion;  Surgeon: Duke Salvia, MD;  Location: [facility] INVASIVE CV  LAB;  Service: Cardiovascular;  Laterality: N/A;   INGUINAL HERNIA REPAIR Bilateral    KNEE ARTHROSCOPY Right    SHOULDER ARTHROSCOPY Left 09/2022   SHOULDER ARTHROSCOPY Left 08/02/2014   Procedure: LEFT SHOULDER SCOPE DEBRIDEMENT/ACROMIOPLASTY;  Surgeon: Teryl Lucy, MD;  Location: [facility];  Service: Orthopedics;  Laterality: Left;  ANESTHESIA: GENERAL, PRE/POST OP SCALENE   TEE WITHOUT CARDIOVERSION N/A 03/03/2015   Procedure: TRANSESOPHAGEAL ECHOCARDIOGRAM (TEE);  Surgeon: Laqueta Linden, MD;  [facility]: [facility];  Service: Cardiology;  Laterality: N/A;   TRIGGER FINGER RELEASE Left 12/22/2022   Procedure: LEFT A-1 PULLEY RELEASE LONG FINGER;  Surgeon: Tarry Kos, MD;  Location: [facility];  Service: Orthopedics;  Laterality: Left;   UMBILICAL HERNIA REPAIR     with other hernia repair with mesh   WRIST SURGERY Right    fusion   Patient Active Problem List   Diagnosis Date Noted   Impingement syndrome of right shoulder 01/25/2023   Nontraumatic incomplete tear of right rotator cuff 01/25/2023   Stenosing tenosynovitis of finger of left hand 12/22/2022   Hypercoagulable state due to paroxysmal atrial fibrillation (HCC) 07/19/2022   Nontraumatic incomplete tear of left rotator cuff 07/07/2022   Arthritis of  left acromioclavicular joint 07/07/2022   Right groin hernia 03/03/2021   Hyperlipidemia associated with type 2 diabetes mellitus (HCC) 12/25/2020   Tendinopathy of right rotator cuff 09/25/2020   Arthritis of right acromioclavicular joint 09/25/2020   Adrenal adenoma, left 09/07/2019   Chronic anticoagulation    GI bleed 06/14/2019   Vitamin D deficiency 04/18/2019   History of stroke 04/20/2018   GAD (generalized anxiety disorder) 05/15/2016   Acute bronchiolitis due to respiratory syncytial virus (RSV)    Acute CVA (cerebrovascular accident) (HCC) 05/04/2016   Dizziness 05/02/2016   Anxiety and depression 05/02/2016    Paroxysmal atrial fibrillation (HCC) 03/02/2016   PFO (patent foramen ovale) 07/24/2015   OSA (obstructive sleep apnea) 04/18/2015   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 Lilyan Punt REFERRING PROVIDER: Dr. Gershon Mussel  ONSET DATE: 03/02/23  REFERRING DIAG: E45.409 (ICD-10-CM) - S/P arthroscopy of shoulder   THERAPY DIAG:  Acute pain of right shoulder  Other symptoms and signs involving the musculoskeletal system  Rationale for Evaluation and Treatment: Rehabilitation  SUBJECTIVE:   SUBJECTIVE STATEMENT: S: I'm hurting pretty bad  Pt accompanied by: self  PERTINENT HISTORY: Pt is a 62 y/o male s/p right shoulder arthroscopy on 03/02/23 for extensive debridement, distal clavicle excision, and subacromial decompression. Marland Kitchen Pt presents without sling, reports he has been trying to move his arm. Pt reports high soreness, has been driving a zero turn lawnmower a little.   PRECAUTIONS: ShoulderProgress as tolerated: P/ROM to AA/ROM to A/ROM  WEIGHT BEARING RESTRICTIONS: No  PAIN:  Are you having pain? Yes: NPRS scale: 7/10 Pain location: right shoulder Pain description: sore Aggravating factors: movement Relieving factors: heat  FALLS: Has patient fallen in last 6 months? No  PLOF: Independent  PATIENT GOALS: To be able to use his RUE normally.   OBJECTIVE:   Note: Objective measures were completed at Evaluation unless otherwise noted.  HAND DOMINANCE: Right  ADLs: Overall ADLs: Pt is having difficulty with reaching into overhead cabinets and behind back. Putting his belt through a loop is difficult, has difficulty reaching into microwave and dryer. Pt requires assistance with pull-over shirts. Pt with mod difficulty sleeping.   FUNCTIONAL OUTCOME MEASURES: FOTO: 53/100  UPPER EXTREMITY ROM:       Pt assessed in sitting, er/IR adducted  Active ROM Right eval  Shoulder flexion 110  Shoulder abduction 126  Shoulder internal rotation 90  Shoulder external rotation 58  (Blank rows = not tested)   UPPER EXTREMITY MMT:       Pt assessed via observations due to protocol  MMT Right eval  Shoulder flexion 3/5  Shoulder abduction 3/5  Shoulder internal rotation 3/5  Shoulder external rotation 3/5  (Blank rows = not tested)   SENSATION: WFL  EDEMA: None  COGNITION: Overall cognitive status: Within functional limits for tasks assessed  OBSERVATIONS: mild/mod fascial restrictions in right shoulder, trapezius, and scapular regions  TODAY'S TREATMENT:                                                                                                                              DATE:   03/29/23 -Manual Therapy: myofascial release and trigger point applied to biceps, deltoid, scapular region, and trapezius, in order to reduce pain and fascial restrictions and improve ROM.  -AA/ROM: supine, flexion, abduction, protraction, horizontal abduction, er/IR, x12 -A/ROM: supine, flexion, abduction, protraction, horizontal abduction, er/IR, x10  03/23/23 -A/ROM: supine-protraction, flexion, abduction, er, horizontal abduction, 10 reps -A/ROM: standing-protraction, flexion, abduction, er, horizontal abduction, 10 reps   PATIENT EDUCATION: Education details: [employer] Person educated: Patient Education method: Explanation,  Demonstration, and Verbal cues Education comprehension: verbalized understanding and returned demonstration  HOME EXERCISE PROGRAM: Eval: A/ROM 11/26: Wall Slides  GOALS: Goals reviewed with patient? Yes  SHORT TERM GOALS: Target date: 04/13/23  Pt will be provided with and educated on HEP to improve mobility in RUE required for use during ADL completion.   Goal status: IN PROGRESS  2.  Pt will decrease RUE fascial restrictions to min amounts or less to improve mobility required for functional reaching tasks.   Goal status: IN PROGRESS   LONG TERM GOALS: Target date: 05/04/23  Pt will decrease pain in RUE to 4/10 or less to improve ability to sleep for 2+ consecutive hours without waking due to pain.   Goal status: IN PROGRESS  2.  Pt will increase RUE A/ROM by 25 degrees or greater to improve ability to use RUE when reaching overhead or behind back during dressing and bathing tasks.   Goal status: IN PROGRESS  3.  Pt will increase RUE strength to 4/5 or greater to improve ability to use RUE when lifting or carrying items during meal preparation/housework/yardwork tasks.   Goal status: IN PROGRESS    ASSESSMENT:  CLINICAL IMPRESSION: This session pt presented with increased pain and stiffness. He was able to achieve full ROM with AA/ROM and approximately 70% of full ROM actively. He appears to have increased pain when completing active movements with obvious poor control and weakness. OT providing verbal and tactile cuing for positioning and technique.   PERFORMANCE DEFICITS: in functional skills including ADLs, IADLs, ROM, strength, pain, fascial restrictions, and UE functional use   PLAN:  OT FREQUENCY: 1x/week  OT DURATION: 6 weeks  PLANNED INTERVENTIONS: 97168 OT Re-evaluation, 97535 self care/ADL training, 98119 therapeutic exercise, 97530 therapeutic activity, 97140 manual therapy, 97035 ultrasound, 97014 electrical stimulation unattended, patient/family  education, and DME and/or AE instructions  RECOMMENDED OTHER SERVICES: None  CONSULTED AND AGREED WITH PLAN OF CARE: Patient  PLAN FOR NEXT SESSION: Follow up on HEP, manual techniques prn, passive stretching, A/ROM, scapular mobility and strengthening   Trish Mage, OTR/L [phone] 03/29/2023, 11:04 AM

## 2023-04-03 ENCOUNTER — Other Ambulatory Visit: Payer: Self-pay | Admitting: Family Medicine

## 2023-04-04 ENCOUNTER — Other Ambulatory Visit (HOSPITAL_COMMUNITY): Payer: Self-pay

## 2023-04-05 ENCOUNTER — Encounter (HOSPITAL_COMMUNITY): Payer: 59 | Admitting: Occupational Therapy

## 2023-04-05 MED ORDER — ALPRAZOLAM 0.25 MG PO TABS
ORAL_TABLET | ORAL | 1 refills | Status: DC
  Filled 2023-04-05: qty 75, 30d supply, fill #0
  Filled 2023-05-09: qty 75, 30d supply, fill #1

## 2023-04-06 ENCOUNTER — Other Ambulatory Visit (HOSPITAL_COMMUNITY): Payer: Self-pay

## 2023-04-06 ENCOUNTER — Other Ambulatory Visit: Payer: Self-pay

## 2023-04-07 ENCOUNTER — Other Ambulatory Visit: Payer: Self-pay

## 2023-04-07 ENCOUNTER — Ambulatory Visit (HOSPITAL_COMMUNITY): Payer: 59 | Attending: Family Medicine | Admitting: Occupational Therapy

## 2023-04-07 ENCOUNTER — Encounter (HOSPITAL_COMMUNITY): Payer: Self-pay | Admitting: Occupational Therapy

## 2023-04-07 ENCOUNTER — Encounter: Payer: Self-pay | Admitting: Pharmacist

## 2023-04-07 ENCOUNTER — Other Ambulatory Visit (HOSPITAL_COMMUNITY): Payer: Self-pay

## 2023-04-07 DIAGNOSIS — M25512 Pain in left shoulder: Secondary | ICD-10-CM | POA: Diagnosis not present

## 2023-04-07 DIAGNOSIS — M25511 Pain in right shoulder: Secondary | ICD-10-CM | POA: Diagnosis not present

## 2023-04-07 DIAGNOSIS — R29898 Other symptoms and signs involving the musculoskeletal system: Secondary | ICD-10-CM | POA: Diagnosis not present

## 2023-04-07 DIAGNOSIS — M25612 Stiffness of left shoulder, not elsewhere classified: Secondary | ICD-10-CM | POA: Insufficient documentation

## 2023-04-07 NOTE — Therapy (Signed)
OUTPATIENT OCCUPATIONAL THERAPY ORTHO TREATMENT NOTE  Patient Name: Travis [name] MRN: [id number] DOB:01-Jan-1961, 62 y.o., male Today's Date: 04/07/2023    END OF SESSION:  OT End of Session - 04/07/23 1055     Visit Number 3    Number of Visits 6    Date for OT Re-Evaluation 05/04/23    Authorization Type [employer] Employee-$40 copay    OT Start Time 1019    OT Stop Time 1058    OT Time Calculation (min) 39 min    Activity Tolerance Patient tolerated treatment well    Behavior During Therapy WFL for tasks assessed/performed              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   Anxiety    Arthritis    Blood transfusion without reported diagnosis    Complication of anesthesia    bleed after last shoulder-had to stay overnight   Depression    Diabetes mellitus    Diabetic peripheral neuropathy (HCC) 03/28/2013   Diverticulosis    Dizziness    Hernia, inguinal    Hyperlipidemia    Hypertension    Impingement syndrome of left shoulder 08/02/2014   Ischemic colitis (HCC)    Multi-infarct state 10/14/2014   Neuropathy of lower extremity    Night sweats    every once in a while   Sleep apnea    no CPAP   Stroke (HCC) 02/2015   no deficits   Syncope and collapse    Past Surgical History:  Procedure Laterality Date   BIOPSY  06/14/2019   Procedure: BIOPSY;  Surgeon: Sherrilyn Rist, MD;  Location: [facility];  Service: Gastroenterology;;   CERVICAL DISC SURGERY     COLON SURGERY  05/03/2001   1/3 removed for diverticulitis   COLONOSCOPY     COLONOSCOPY WITH PROPOFOL N/A 06/14/2019   Procedure: COLONOSCOPY WITH PROPOFOL;  Surgeon: Sherrilyn Rist, MD;  Location: [facility];  Service: Gastroenterology;  Laterality: N/A;   EP IMPLANTABLE DEVICE N/A 05/01/2015   Procedure: Loop Recorder Insertion;  Surgeon: Duke Salvia, MD;  Location: [facility] INVASIVE CV  LAB;  Service: Cardiovascular;  Laterality: N/A;   INGUINAL HERNIA REPAIR Bilateral    KNEE ARTHROSCOPY Right    SHOULDER ARTHROSCOPY Left 09/2022   SHOULDER ARTHROSCOPY Left 08/02/2014   Procedure: LEFT SHOULDER SCOPE DEBRIDEMENT/ACROMIOPLASTY;  Surgeon: Teryl Lucy, MD;  Location: [facility];  Service: Orthopedics;  Laterality: Left;  ANESTHESIA: GENERAL, PRE/POST OP SCALENE   TEE WITHOUT CARDIOVERSION N/A 03/03/2015   Procedure: TRANSESOPHAGEAL ECHOCARDIOGRAM (TEE);  Surgeon: Laqueta Linden, MD;  [facility]: [facility];  Service: Cardiology;  Laterality: N/A;   TRIGGER FINGER RELEASE Left 12/22/2022   Procedure: LEFT A-1 PULLEY RELEASE LONG FINGER;  Surgeon: Tarry Kos, MD;  Location: [facility];  Service: Orthopedics;  Laterality: Left;   UMBILICAL HERNIA REPAIR     with other hernia repair with mesh   WRIST SURGERY Right    fusion   Patient Active Problem List   Diagnosis Date Noted   Impingement syndrome of right shoulder 01/25/2023   Nontraumatic incomplete tear of right rotator cuff 01/25/2023   Stenosing tenosynovitis of finger of left hand 12/22/2022   Hypercoagulable state due to paroxysmal atrial fibrillation (HCC) 07/19/2022   Nontraumatic incomplete tear of left rotator cuff 07/07/2022   Arthritis  of left acromioclavicular joint 07/07/2022   Right groin hernia 03/03/2021   Hyperlipidemia associated with type 2 diabetes mellitus (HCC) 12/25/2020   Tendinopathy of right rotator cuff 09/25/2020   Arthritis of right acromioclavicular joint 09/25/2020   Adrenal adenoma, left 09/07/2019   Chronic anticoagulation    GI bleed 06/14/2019   Vitamin D deficiency 04/18/2019   History of stroke 04/20/2018   GAD (generalized anxiety disorder) 05/15/2016   Acute bronchiolitis due to respiratory syncytial virus (RSV)    Acute CVA (cerebrovascular accident) (HCC) 05/04/2016   Dizziness 05/02/2016   Anxiety and depression 05/02/2016    Paroxysmal atrial fibrillation (HCC) 03/02/2016   PFO (patent foramen ovale) 07/24/2015   OSA (obstructive sleep apnea) 04/18/2015   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 Lilyan Punt REFERRING PROVIDER: Dr. Gershon Mussel  ONSET DATE: 03/02/23  REFERRING DIAG: B14.782 (ICD-10-CM) - S/P arthroscopy of shoulder   THERAPY DIAG:  Acute pain of right shoulder  Other symptoms and signs involving the musculoskeletal system  Acute pain of left shoulder  Stiffness of left shoulder, not elsewhere classified  Rationale for Evaluation and Treatment: Rehabilitation  SUBJECTIVE:   SUBJECTIVE STATEMENT: S: I'm not doing the exercises every day.   PERTINENT HISTORY: Pt is a 62 y/o male s/p right shoulder arthroscopy on 03/02/23 for extensive debridement, distal clavicle excision, and subacromial decompression. Marland Kitchen Pt presents without sling, reports he has been trying to move his arm. Pt reports high soreness, has been driving a zero turn lawnmower a little.   PRECAUTIONS: ShoulderProgress as tolerated: P/ROM to AA/ROM to A/ROM  WEIGHT BEARING RESTRICTIONS: No  PAIN:  Are you having pain? Yes: NPRS scale: 6/10 Pain location: right shoulder Pain description: sore Aggravating factors: movement Relieving factors: heat  FALLS: Has patient fallen in last 6 months? No  PLOF:  Independent  PATIENT GOALS: To be able to use his RUE normally.   OBJECTIVE:  Note: Objective measures were completed at Evaluation unless otherwise noted.  HAND DOMINANCE: Right  ADLs: Overall ADLs: Pt is having difficulty with reaching into overhead cabinets and behind back. Putting his belt through a loop is difficult, has difficulty reaching into microwave and dryer. Pt requires assistance with pull-over shirts. Pt with mod difficulty sleeping.   FUNCTIONAL OUTCOME MEASURES: FOTO: 53/100  UPPER EXTREMITY ROM:       Pt assessed in sitting, er/IR adducted  Active ROM Right eval  Shoulder flexion 110  Shoulder abduction 126  Shoulder internal rotation 90  Shoulder external rotation 58  (Blank rows = not tested)   UPPER EXTREMITY MMT:       Pt assessed via observations due to protocol  MMT Right eval  Shoulder flexion 3/5  Shoulder abduction 3/5  Shoulder internal rotation 3/5  Shoulder external rotation 3/5  (Blank rows = not tested)   OBSERVATIONS: mild/mod fascial restrictions in right shoulder, trapezius, and scapular regions   TODAY'S TREATMENT:  DATE:  04/07/23 -A/ROM: supine-protraction, flexion, abduction, er, horizontal abduction, 12 reps -A/ROM: standing-protraction, flexion, abduction, er, horizontal abduction, 10 reps -Proximal shoulder strengthening: supine-paddles, criss cross, circles each direction, 10 reps -Serratus anterior punch: supine, 10 reps -A/ROM: standing-protraction, flexion, abduction, er, horizontal abduction, 10 reps -Proximal shoulder strengthening: standing-paddles, criss cross, circles each direction, 10 reps -Wall wash: 1' flexion -Functional reaching: pt placing 10 cones on top shelf of overhead cabinet in flexion, removing in abduction -Cone pass behind back for IR, 10 reps, behind head for er, 10  reps  03/29/23 -Manual Therapy: myofascial release and trigger point applied to biceps, deltoid, scapular region, and trapezius, in order to reduce pain and fascial restrictions and improve ROM.  -AA/ROM: supine, flexion, abduction, protraction, horizontal abduction, er/IR, x12 -A/ROM: supine, flexion, abduction, protraction, horizontal abduction, er/IR, x10  03/23/23 -A/ROM: supine-protraction, flexion, abduction, er, horizontal abduction, 10 reps -A/ROM: standing-protraction, flexion, abduction, er, horizontal abduction, 10 reps   PATIENT EDUCATION: Education details: reviewed HEP Person educated: Patient Education method: Programmer, multimedia, Demonstration, and Verbal cues Education comprehension: verbalized understanding and returned demonstration  HOME EXERCISE PROGRAM: Eval: A/ROM 11/26: Wall Slides   GOALS: Goals reviewed with patient? Yes  SHORT TERM GOALS: Target date: 04/13/23  Pt will be provided with and educated on HEP to improve mobility in RUE required for use during ADL completion.   Goal status: IN PROGRESS  2.  Pt will decrease RUE fascial restrictions to min amounts or less to improve mobility required for functional reaching tasks.   Goal status: IN PROGRESS   LONG TERM GOALS: Target date: 05/04/23  Pt will decrease pain in RUE to 4/10 or less to improve ability to sleep for 2+ consecutive hours without waking due to pain.   Goal status: IN PROGRESS  2.  Pt will increase RUE A/ROM by 25 degrees or greater to improve ability to use RUE when reaching overhead or behind back during dressing and bathing tasks.   Goal status: IN PROGRESS  3.  Pt will increase RUE strength to 4/5 or greater to improve ability to use RUE when lifting or carrying items during meal preparation/housework/yardwork tasks.   Goal status: IN PROGRESS    ASSESSMENT:  CLINICAL IMPRESSION: Pt reports less pain, is not completing HEP daily. No manual techniques necessary today,  continued with A/ROM, pt with full A/ROM in supine, 75% ROM in standing. Added proximal shoulder strengthening, and functional reaching today. Added cone pass to simulating reaching back for belt loop or washing hair. Verbal cuing for form and technique, reminded pt of importance of completing HEP daily.   PERFORMANCE DEFICITS: in functional skills including ADLs, IADLs, ROM, strength, pain, fascial restrictions, and UE functional use   PLAN:  OT FREQUENCY: 1x/week  OT DURATION: 6 weeks  PLANNED INTERVENTIONS: 97168 OT Re-evaluation, 97535 self care/ADL training, 16109 therapeutic exercise, 97530 therapeutic activity, 97140 manual therapy, 97035 ultrasound, 97014 electrical stimulation unattended, patient/family education, and DME and/or AE instructions  CONSULTED AND AGREED WITH PLAN OF CARE: Patient  PLAN FOR NEXT SESSION: Follow up on HEP, manual techniques prn, passive stretching, A/ROM, scapular mobility and strengthening   Ezra Sites, OTR/L  [phone] 04/07/2023, 10:58 AM

## 2023-04-08 ENCOUNTER — Other Ambulatory Visit: Payer: Self-pay | Admitting: Family Medicine

## 2023-04-08 ENCOUNTER — Other Ambulatory Visit (HOSPITAL_COMMUNITY): Payer: Self-pay

## 2023-04-08 MED ORDER — FLUTICASONE PROPIONATE 50 MCG/ACT NA SUSP
1.0000 | Freq: Every day | NASAL | 3 refills | Status: DC
  Filled 2023-04-08: qty 16, 60d supply, fill #0
  Filled 2023-07-20: qty 16, 60d supply, fill #1
  Filled 2023-09-06: qty 16, 60d supply, fill #2
  Filled 2023-11-11: qty 16, 60d supply, fill #3

## 2023-04-11 ENCOUNTER — Other Ambulatory Visit: Payer: Self-pay

## 2023-04-11 ENCOUNTER — Other Ambulatory Visit (HOSPITAL_COMMUNITY): Payer: Self-pay

## 2023-04-12 ENCOUNTER — Encounter: Payer: Self-pay | Admitting: Nurse Practitioner

## 2023-04-12 ENCOUNTER — Encounter (HOSPITAL_COMMUNITY): Payer: 59 | Admitting: Occupational Therapy

## 2023-04-12 ENCOUNTER — Other Ambulatory Visit (HOSPITAL_COMMUNITY): Payer: Self-pay

## 2023-04-12 ENCOUNTER — Other Ambulatory Visit: Payer: Self-pay

## 2023-04-12 ENCOUNTER — Ambulatory Visit (INDEPENDENT_AMBULATORY_CARE_PROVIDER_SITE_OTHER): Payer: 59 | Admitting: Nurse Practitioner

## 2023-04-12 VITALS — BP 136/88 | HR 94 | Ht 73.0 in | Wt 182.4 lb

## 2023-04-12 DIAGNOSIS — Z794 Long term (current) use of insulin: Secondary | ICD-10-CM

## 2023-04-12 DIAGNOSIS — E782 Mixed hyperlipidemia: Secondary | ICD-10-CM | POA: Diagnosis not present

## 2023-04-12 DIAGNOSIS — Z7984 Long term (current) use of oral hypoglycemic drugs: Secondary | ICD-10-CM | POA: Diagnosis not present

## 2023-04-12 DIAGNOSIS — E559 Vitamin D deficiency, unspecified: Secondary | ICD-10-CM

## 2023-04-12 DIAGNOSIS — D3502 Benign neoplasm of left adrenal gland: Secondary | ICD-10-CM

## 2023-04-12 DIAGNOSIS — E1159 Type 2 diabetes mellitus with other circulatory complications: Secondary | ICD-10-CM | POA: Diagnosis not present

## 2023-04-12 LAB — POCT GLYCOSYLATED HEMOGLOBIN (HGB A1C): Hemoglobin A1C: 7.4 % — AB (ref 4.0–5.6)

## 2023-04-12 MED ORDER — DEXCOM G7 SENSOR MISC
1.0000 | 3 refills | Status: DC
  Filled 2023-04-12: qty 9, 90d supply, fill #0
  Filled 2023-05-09 (×2): qty 3, 30d supply, fill #0
  Filled ????-??-??: fill #0

## 2023-04-12 MED ORDER — TRESIBA FLEXTOUCH 100 UNIT/ML ~~LOC~~ SOPN
20.0000 [IU] | PEN_INJECTOR | Freq: Every day | SUBCUTANEOUS | 3 refills | Status: DC
  Filled 2023-04-12 – 2023-05-23 (×2): qty 18, 90d supply, fill #0
  Filled 2023-07-22: qty 18, 90d supply, fill #1

## 2023-04-12 NOTE — Progress Notes (Signed)
04/12/2023, 2:06 PM              Endocrinology follow-up note    Subjective:    Patient ID: Travis [name], male    DOB: 05-21-1960.  Travis [name] is being seen in follow-up  for management of currently uncontrolled symptomatic diabetes requested by  Babs Sciara, MD.   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   Anxiety    Arthritis    Blood transfusion without reported diagnosis    Complication of anesthesia    bleed after last shoulder-had to stay overnight   Depression    Diabetes mellitus    Diabetic peripheral neuropathy (HCC) 03/28/2013   Diverticulosis    Dizziness    Hernia, inguinal    Hyperlipidemia    Hypertension    Impingement syndrome of left shoulder 08/02/2014   Ischemic colitis (HCC)    Multi-infarct state 10/14/2014   Neuropathy of lower extremity    Night sweats    every once in a while   Sleep apnea    no CPAP   Stroke (HCC) 02/2015   no deficits   Syncope and collapse    Past Surgical History:  Procedure Laterality Date   BIOPSY  06/14/2019   Procedure: BIOPSY;  Surgeon: Sherrilyn Rist, MD;  Location: [facility];  Service: Gastroenterology;;   CERVICAL DISC SURGERY     COLON SURGERY  05/03/2001   1/3 removed for diverticulitis   COLONOSCOPY     COLONOSCOPY WITH PROPOFOL N/A 06/14/2019   Procedure: COLONOSCOPY WITH PROPOFOL;  Surgeon: Sherrilyn Rist, MD;  Location: [facility];  Service: Gastroenterology;  Laterality: N/A;   EP IMPLANTABLE DEVICE N/A 05/01/2015   Procedure: Loop Recorder Insertion;  Surgeon: Duke Salvia, MD;  Location: [facility] CV [facility];  Service: Cardiovascular;  Laterality: N/A;   INGUINAL HERNIA REPAIR Bilateral    KNEE ARTHROSCOPY Right    SHOULDER ARTHROSCOPY Left 09/2022   SHOULDER ARTHROSCOPY Left 08/02/2014   Procedure: LEFT  SHOULDER SCOPE DEBRIDEMENT/ACROMIOPLASTY;  Surgeon: Teryl Lucy, MD;  Location: [facility];  Service: Orthopedics;  Laterality: Left;  ANESTHESIA: GENERAL, PRE/POST OP SCALENE   TEE WITHOUT CARDIOVERSION N/A 03/03/2015   Procedure: TRANSESOPHAGEAL ECHOCARDIOGRAM (TEE);  Surgeon: Laqueta Linden, MD;  Location: [facility];  Service: Cardiology;  Laterality: N/A;   TRIGGER FINGER RELEASE Left 12/22/2022   Procedure: LEFT A-1 PULLEY RELEASE LONG FINGER;  Surgeon: Tarry Kos, MD;  Location: [facility];  Service: Orthopedics;  Laterality: Left;   UMBILICAL HERNIA REPAIR     with other hernia repair with mesh   WRIST SURGERY Right    fusion   Social History   Socioeconomic History   Marital status: Married    Spouse name: Not on file   Number of children: 2   Years of education: College   Highest education level: Some college, no degree  Occupational History   Occupation: disabled  Tobacco Use   Smoking status: Former    Current packs/day: 0.00    Average packs/day: 1 pack/day for 8.0 years (8.0  ttl pk-yrs)    Types: Cigarettes    Start date: 06/07/1970    Quit date: 05/03/1978    Years since quitting: 44.9   Smokeless tobacco: Former    Types: Chew    Quit date: 05/03/1978   Tobacco comments:    Former smoker 07/19/22  Vaping Use   Vaping status: Never Used  Substance and Sexual Activity   Alcohol use: Yes    Alcohol/week: 1.0 standard drink of alcohol    Types: 1 Cans of beer per week    Comment: 1 beer every 6 months h/o heavy use in the past 07/19/22   Drug use: No   Sexual activity: Yes  Other Topics Concern   Not on file  Social History Narrative   Drinks some coffee, Drink diet sodas and tea   Social Determinants of Health   Financial Resource Strain: Low Risk  (10/10/2022)   Overall Financial Resource Strain (CARDIA)    Difficulty of Paying Living Expenses: Not hard at all  Food Insecurity: No Food Insecurity (10/10/2022)   Hunger  Vital Sign    Worried About Running Out of Food in the Last Year: Never true    Ran Out of Food in the Last Year: Never true  Transportation Needs: No Transportation Needs (10/10/2022)   PRAPARE - Administrator, Civil Service (Medical): No    Lack of Transportation (Non-Medical): No  Physical Activity: Unknown (10/10/2022)   Exercise Vital Sign    Days of Exercise per Week: 0 days    Minutes of Exercise per Session: Not on file  Stress: No Stress Concern Present (10/10/2022)   [employer] of Occupational Health - Occupational Stress Questionnaire    Feeling of Stress : Only a little  Social Connections: Moderately Integrated (10/10/2022)   Social Connection and Isolation Panel [NHANES]    Frequency of Communication with Friends and Family: More than three times a week    Frequency of Social Gatherings with Friends and Family: Three times a week    Attends Religious Services: 1 to 4 times per year    Active Member of Clubs or Organizations: No    Attends Banker Meetings: Not on file    Marital Status: Married   Outpatient Encounter Medications as of 04/12/2023  Medication Sig   Accu-Chek FastClix Lancets MISC Use to check blood sugar twice daily.   ALPRAZolam (XANAX) 0.25 MG tablet Take 0.5 tablets (0.125 mg total) by mouth in the morning AND 1 tablet (0.25 mg total) daily in the afternoon AND 1 tablet (0.25 mg total) every evening. Take as needed   apixaban (ELIQUIS) 5 MG TABS tablet Take 1 tablet (5 mg total) by mouth 2 (two) times daily.   atorvastatin (LIPITOR) 20 MG tablet Take 1 tablet (20 mg total) by mouth daily.   Blood Glucose Monitoring Suppl (FREESTYLE LITE) w/Device KIT Use to check glucose twice daily   Blood Pressure Monitoring (OMRON 3 SERIES BP MONITOR) DEVI USE AS DIRECTED.   Cholecalciferol 50 MCG (2000 UT) CAPS Take 1 capsule (2,000 Units total) by mouth daily with breakfast.   COVID-19 mRNA vaccine 2023-2024 (COMIRNATY) syringe Inject into  the muscle.   FLUoxetine (PROZAC) 20 MG capsule Take 1 capsule by mouth daily.   fluticasone (FLONASE) 50 MCG/ACT nasal spray Place 1 spray into both nostrils daily.   folic acid (FOLVITE) 1 MG tablet Take 1 tablet (1 mg total) by mouth daily.   gabapentin (NEURONTIN) 300 MG capsule Take 1  capsule (300 mg total) by mouth every morning AND 1 capsule (300 mg total) daily after lunch AND 2 capsules (600 mg total) at bedtime.   glucose blood (ACCU-CHEK GUIDE) test strip USE AS DIRECTED TO TEST BLOOD SUGAR 2 TIMES DAILY AS DIRECTED   Insulin Pen Needle (COMFORT EZ PEN NEEDLES) 31G X 6 MM MISC USE TO INJECT INSULIN DAILY AS DIRECTED   Insulin Pen Needle (UNIFINE PENTIPS) 31G X 6 MM MISC USE TO INJECT INSULIN DAILY AS DIRECTED   metFORMIN (GLUCOPHAGE) 500 MG tablet Take 1 tablet (500 mg total) by mouth 2 (two) times daily with a meal.   Multiple Vitamin (MULTIVITAMIN WITH MINERALS) TABS tablet Take 1 tablet daily by mouth.   ondansetron (ZOFRAN) 4 MG tablet Take 1 tablet (4 mg total) by mouth every 8 (eight) hours as needed for nausea or vomiting.   RSV vaccine recomb adjuvanted (AREXVY) 120 MCG/0.5ML injection Inject into the muscle.   traMADol (ULTRAM) 50 MG tablet Take 1 tablet (50 mg total) by mouth 3 (three) times daily as needed.   valACYclovir (VALTREX) 1000 MG tablet Take 1 tablet by mouth daily.   [DISCONTINUED] Continuous Glucose Sensor (DEXCOM G7 SENSOR) MISC Inject into the skin as directed to check continuous blood sugar. Change sensor every 10 days as directed.   [DISCONTINUED] insulin degludec (TRESIBA FLEXTOUCH) 100 UNIT/ML FlexTouch Pen Inject 30 Units into the skin at bedtime.   Continuous Glucose Sensor (DEXCOM G7 SENSOR) MISC Inject into the skin as directed to check continuous blood sugar. Change sensor every 10 days as directed.   HYDROcodone-acetaminophen (NORCO) 5-325 MG tablet Take 1 tablet by mouth every 6 (six) hours as needed. To be taken after surgery   insulin degludec  (TRESIBA FLEXTOUCH) 100 UNIT/ML FlexTouch Pen Inject 20 Units into the skin at bedtime.   [DISCONTINUED] Insulin Glargine (BASAGLAR KWIKPEN) 100 UNIT/ML INJECT 50 UNITS INTO THE SKIN AT BEDTIME   No facility-administered encounter medications on file as of 04/12/2023.    ALLERGIES: Allergies  Allergen Reactions   Lisinopril Cough    Patient/spouse is not aware/familiar with why this is listed as an allergy Not a true allergy but on this list to avoid ACE inhibitors-SA Luking MD    VACCINATION STATUS: Immunization History  Administered Date(s) Administered   Influenza Split 03/28/2013   Influenza, Seasonal, Injecte, Preservative Fre 02/22/2023   Influenza,inj,Quad PF,6+ Mos 02/18/2014, 01/23/2015, 02/06/2016, 02/23/2017, 02/24/2018, 01/31/2020, 02/18/2021   Influenza-Unspecified 01/02/2011, 02/09/2019, 03/17/2022   Moderna Sars-Covid-2 Vaccination 07/21/2019, 08/22/2019, 05/01/2020   PNEUMOCOCCAL CONJUGATE-20 10/01/2021   Pfizer(Comirnaty)Fall Seasonal Vaccine 12 years and older 05/10/2022   Pneumococcal Polysaccharide-23 01/31/2010, 03/01/2015   Respiratory Syncytial Virus Vaccine,Recomb Aduvanted(Arexvy) 05/18/2022   Td (Adult),5 Lf Tetanus Toxid, Preservative Free 02/21/2003   Tdap 02/21/2003, 02/18/2014   Zoster Recombinant(Shingrix) 04/19/2019, 06/29/2019    Diabetes He presents for his follow-up diabetic visit. He has type 2 diabetes mellitus. Onset time: He was diagnosed at approximate age of 66 years. His disease course has been improving (He has prior history of heavy alcohol use/abuse.). There are no hypoglycemic associated symptoms. Pertinent negatives for hypoglycemia include no confusion, headaches, pallor or seizures. Associated symptoms include foot paresthesias. Pertinent negatives for diabetes include no chest pain, no fatigue, no polydipsia, no polyphagia, no polyuria and no weakness. There are no hypoglycemic complications. Symptoms are stable. Diabetic complications  include a CVA, nephropathy and peripheral neuropathy. Risk factors for coronary artery disease include dyslipidemia, diabetes mellitus, hypertension, family history, male sex, tobacco exposure and sedentary  lifestyle. Current diabetic treatment includes insulin injections and oral agent (monotherapy). He is compliant with treatment most of the time. His weight is fluctuating minimally. He is following a generally unhealthy diet. When asked about meal planning, he reported none. He has had a previous visit with a dietitian. He participates in exercise intermittently. His home blood glucose trend is decreasing steadily. His overall blood glucose range is 140-180 mg/dl. (He presents today with his CGM showing improved glycemic profile overall.  His POCT A1c today is 7.4%, improving from last visit of 7.7%.  Analysis of his CGM shows TIR 54%, TAR 46%, TBR 0% with a GMI of 7.5%.  He does have a tendency to over-correct when he gets an alarm that glucose is dropping.) An ACE inhibitor/angiotensin II receptor blocker is not being taken. He does not see a podiatrist.Eye exam is current.  Hyperlipidemia This is a chronic problem. The current episode started more than 1 year ago. The problem is controlled. Recent lipid tests were reviewed and are normal. Exacerbating diseases include chronic renal disease and diabetes. Factors aggravating his hyperlipidemia include smoking. Pertinent negatives include no chest pain, myalgias or shortness of breath. Current antihyperlipidemic treatment includes statins. The current treatment provides moderate improvement of lipids. There are no compliance problems.  Risk factors for coronary artery disease include dyslipidemia, diabetes mellitus, hypertension, male sex, family history and a sedentary lifestyle.  Hypertension This is a chronic problem. The current episode started more than 1 year ago. The problem has been resolved since onset. The problem is controlled. Pertinent negatives  include no chest pain, headaches, neck pain, palpitations or shortness of breath. There are no associated agents to hypertension. Risk factors for coronary artery disease include dyslipidemia, diabetes mellitus, family history, male gender, sedentary lifestyle and smoking/tobacco exposure. Past treatments include nothing. Compliance problems include diet.  Hypertensive end-organ damage includes kidney disease, CAD/MI and CVA. Identifiable causes of hypertension include chronic renal disease and sleep apnea.    Review of systems  Constitutional: + Minimally fluctuating body weight,  current Body mass index is 24.06 kg/m. , no fatigue, no subjective hyperthermia, no subjective hypothermia Eyes: no blurry vision, no xerophthalmia ENT: no sore throat, no nodules palpated in throat, no dysphagia/odynophagia, no hoarseness Cardiovascular: no chest pain, no shortness of breath, no palpitations, no leg swelling Respiratory: no cough, no shortness of breath Gastrointestinal: no nausea/vomiting/diarrhea Musculoskeletal: + bilateral shoulder pain (chronic) Skin: no rashes, no hyperemia Neurological: no tremors, no numbness, no tingling, no dizziness Psychiatric: no depression, no anxiety  Objective:    BP 136/88 (BP Location: Left Arm, Patient Position: Sitting, Cuff Size: Large)   Pulse 94   Ht 6\' 1"  (1.854 m)   Wt 182 lb 6.4 oz (82.7 kg)   BMI 24.06 kg/m   Wt Readings from Last 3 Encounters:  04/12/23 182 lb 6.4 oz (82.7 kg)  03/02/23 180 lb 8.9 oz (81.9 kg)  02/09/23 185 lb (83.9 kg)    BP Readings from Last 3 Encounters:  04/12/23 136/88  03/02/23 (!) 160/96  02/09/23 126/77     Physical Exam- Limited  Constitutional:  Body mass index is 24.06 kg/m. , not in acute distress, + inattentive, hyperactive state of mind with rapid/pressured speech Eyes:  EOMI, no exophthalmos Musculoskeletal: no gross deformities, strength intact in all four extremities, no gross restriction of joint  movements Skin:  no rashes, no hyperemia Neurological: no tremor with outstretched hands   Diabetic Foot Exam - Simple   No data filed  CMP     Component Value Date/Time   NA 139 03/01/2023 1130   NA 142 11/18/2022 1059   K 3.9 03/01/2023 1130   CL 108 03/01/2023 1130   CO2 28 03/01/2023 1130   GLUCOSE 184 (H) 03/01/2023 1130   BUN 12 03/01/2023 1130   BUN 14 11/18/2022 1059   CREATININE 0.99 03/01/2023 1130   CREATININE 0.82 02/16/2014 1053   CALCIUM 8.7 (L) 03/01/2023 1130   PROT 6.8 11/18/2022 1059   ALBUMIN 4.4 11/18/2022 1059   AST 18 11/18/2022 1059   ALT 6 11/18/2022 1059   ALKPHOS 88 11/18/2022 1059   BILITOT 0.4 11/18/2022 1059   GFRNONAA >60 03/01/2023 1130   GFRAA 92 05/30/2020 1156     Diabetic Labs (most recent): Lab Results  Component Value Date   HGBA1C 7.4 (A) 04/12/2023   HGBA1C 7.7 (H) 11/18/2022   HGBA1C 7.3 (A) 08/04/2022   MICROALBUR 5.7 12/07/2019   MICROALBUR 0.8 02/16/2014     Lipid Panel ( most recent) Lipid Panel     Component Value Date/Time   CHOL 101 11/18/2022 1059   TRIG 119 11/18/2022 1059   HDL 33 (L) 11/18/2022 1059   CHOLHDL 3.1 11/18/2022 1059   CHOLHDL 3.2 05/03/2016 0545   VLDL 16 05/03/2016 0545   LDLCALC 46 11/18/2022 1059      Lab Results  Component Value Date   TSH 1.390 07/24/2021   TSH 2.120 12/07/2019   TSH 2.12 12/07/2019   TSH 1.960 03/12/2017   TSH 1.460 10/07/2016   TSH 0.720 05/02/2016   TSH 1.895 10/14/2014   FREET4 1.25 07/24/2021   FREET4 1.19 12/07/2019   FREET4 1.21 10/07/2016      Assessment & Plan:   1) DM type 2 causing vascular disease (HCC)  - Travis [name] has currently uncontrolled symptomatic type 2 DM since  62 years of age.  He presents today with his CGM showing improved glycemic profile overall.  His POCT A1c today is 7.4%, improving from last visit of 7.7%.  Analysis of his CGM shows TIR 54%, TAR 46%, TBR 0% with a GMI of 7.5%.  He does have a tendency to  over-correct when he gets an alarm that glucose is dropping.  -his diabetes is complicated by CVA, history of smoking, history of heavy alcohol use/abuse in the past and KAIKEA BRANTLY remains at a high risk for more acute and chronic complications which include CAD, CVA, CKD, retinopathy, and neuropathy. These are all discussed in detail with the patient.  - Nutritional counseling repeated at each appointment due to patients tendency to fall back in to old habits.  - The patient admits there is a room for improvement in their diet and drink choices. -  Suggestion is made for the patient to avoid simple carbohydrates from their diet including Cakes, Sweet Desserts / Pastries, Ice Cream, Soda (diet and regular), Sweet Tea, Candies, Chips, Cookies, Sweet Pastries, Store Bought Juices, Alcohol in Excess of 1-2 drinks a day, Artificial Sweeteners, Coffee Creamer, and "Sugar-free" Products. This will help patient to have stable blood glucose profile and potentially avoid unintended weight gain.   - I encouraged the patient to switch to unprocessed or minimally processed complex starch and increased protein intake (animal or plant source), fruits, and vegetables.   - Patient is advised to stick to a routine mealtimes to eat 3 meals a day and avoid unnecessary snacks (to snack only to correct hypoglycemia).  - I have approached him with  the following individualized plan to manage diabetes and patient agrees:   - There could be a component of pancreatic endocrine and exocrine insufficiency given his prior history of heavy alcohol use.  Therefore, he may need multiple daily injections of insulin to control diabetes in the future.  -#1 priority in this patient is to avoid hypoglycemia.    -Based on his improving glycemic profile, will lower his Evaristo Bury to 20 units SQ nightly and continue Metformin 500 mg po twice daily with meals.  -He is advised to monitor blood glucose twice daily (now using his CGM),  before breakfast and before bed, and call the clinic if he has readings less than 70 or greater than 200 for 3 tests in a row.  He is benefiting from CGM device, is advised to continue.  -Patient is not a candidate for SGLT2 inhibitors, nor incretin therapy due to his body habitus.  - Patient specific target  A1c;  LDL, HDL, Triglycerides, and  Waist Circumference were discussed in detail.  2) BP/HTN:   His blood pressure is controlled to target.  He is not on any antihypertensive medications at this time.  He will be considered for low dose ARB on subsequent visits if BP becomes elevated over 140/90.  He does monitor BP at home and reports normal readings.  3) Lipids/HPL:  His most recent lipid panel from 11/18/22 shows controlled LDL at 46.  He is advised to continue Lipitor 20 mg po daily at bedtime.  Side effects and precautions discussed with him.    4) Left adrenal adenoma- During a past hospitalization for GI bleed from ischemic colitis a CT scan showed incidental finding of 2.1 cm left adrenal adenoma.  Subsequent plasma metanephrines were within normal limits, considered nonfunctional.  He will not need intervention for it at this time. His repeat 24-hr urine metanephrines, catecholamines, cortisol, and aldosterone were all still normal favoring benignity.    5)  Weight/Diet: His Body mass index is 24.06 kg/m.  He is not a weight loss candidate.  CDE Consult  is in progress , exercise, and detailed carbohydrates information provided.  If he experiences unintentional weight loss, he will be considered for Creon therapy.   6) Chronic Care/Health Maintenance: -he is on Statin medications and  is encouraged to continue to follow up with Ophthalmology, Dentist,  Podiatrist at least yearly or according to recommendations, and advised to  stay away from smoking. I have recommended yearly flu vaccine and pneumonia vaccination at least every 5 years; moderate intensity exercise for up to 150  minutes weekly; and  sleep for at least 7 hours a day.  - I advised patient to maintain close follow up with Babs Sciara, MD for primary care needs.     I spent  33  minutes in the care of the patient today including review of labs from CMP, Lipids, Thyroid Function, Hematology (current and previous including abstractions from other facilities); face-to-face time discussing  his blood glucose readings/logs, discussing hypoglycemia and hyperglycemia episodes and symptoms, medications doses, his options of short and long term treatment based on the latest standards of care / guidelines;  discussion about incorporating lifestyle medicine;  and documenting the encounter. Risk reduction counseling performed per USPSTF guidelines to reduce obesity and cardiovascular risk factors.     Please refer to Patient Instructions for Blood Glucose Monitoring and Insulin/Medications Dosing Guide"  in media tab for additional information. Please  also refer to " Patient Self Inventory" in the Media  tab for reviewed elements of pertinent patient history.  Travis [name] participated in the discussions, expressed understanding, and voiced agreement with the above plans.  All questions were answered to his satisfaction. he is encouraged to contact clinic should he have any questions or concerns prior to his return visit.    Follow up plan: - Return in about 4 months (around 08/11/2023) for Diabetes F/U with A1c in office, No previsit labs, Bring meter and logs.    Ronny Bacon, [facility] [address] [place], [place] [address] Phone: [phone] Fax: [phone]  04/12/2023, 2:06 PM

## 2023-04-14 ENCOUNTER — Ambulatory Visit (HOSPITAL_COMMUNITY): Payer: 59 | Admitting: Occupational Therapy

## 2023-04-14 ENCOUNTER — Encounter (HOSPITAL_COMMUNITY): Payer: Self-pay | Admitting: Occupational Therapy

## 2023-04-14 DIAGNOSIS — M25511 Pain in right shoulder: Secondary | ICD-10-CM

## 2023-04-14 DIAGNOSIS — M25512 Pain in left shoulder: Secondary | ICD-10-CM | POA: Diagnosis not present

## 2023-04-14 DIAGNOSIS — R29898 Other symptoms and signs involving the musculoskeletal system: Secondary | ICD-10-CM | POA: Diagnosis not present

## 2023-04-14 DIAGNOSIS — M25612 Stiffness of left shoulder, not elsewhere classified: Secondary | ICD-10-CM | POA: Diagnosis not present

## 2023-04-14 NOTE — Therapy (Signed)
OUTPATIENT OCCUPATIONAL THERAPY ORTHO TREATMENT NOTE  Patient Name: Travis [name] MRN: [id number] DOB:1960/10/27, 62 y.o., male Today's Date: 04/14/2023    END OF SESSION:  OT End of Session - 04/14/23 1150     Visit Number 4    Number of Visits 6    Date for OT Re-Evaluation 05/04/23    Authorization Type [employer] Employee-$40 copay    OT Start Time 1112    OT Stop Time 1150    OT Time Calculation (min) 38 min    Activity Tolerance Patient tolerated treatment well    Behavior During Therapy [facility] for tasks assessed/performed              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   Anxiety    Arthritis    Blood transfusion without reported diagnosis    Complication of anesthesia    bleed after last shoulder-had to stay overnight   Depression    Diabetes mellitus    Diabetic peripheral neuropathy (HCC) 03/28/2013   Diverticulosis    Dizziness    Hernia, inguinal    Hyperlipidemia    Hypertension    Impingement syndrome of left shoulder 08/02/2014   Ischemic colitis (HCC)    Multi-infarct state 10/14/2014   Neuropathy of lower extremity    Night sweats    every once in a while   Sleep apnea    no CPAP   Stroke (HCC) 02/2015   no deficits   Syncope and collapse    Past Surgical History:  Procedure Laterality Date   BIOPSY  06/14/2019   Procedure: BIOPSY;  Surgeon: Sherrilyn Rist, MD;  Location: [facility];  Service: Gastroenterology;;   CERVICAL DISC SURGERY     COLON SURGERY  05/03/2001   1/3 removed for diverticulitis   COLONOSCOPY     COLONOSCOPY WITH PROPOFOL N/A 06/14/2019   Procedure: COLONOSCOPY WITH PROPOFOL;  Surgeon: Sherrilyn Rist, MD;  Location: [facility];  Service: Gastroenterology;  Laterality: N/A;   EP IMPLANTABLE DEVICE N/A 05/01/2015   Procedure: Loop Recorder Insertion;  Surgeon: Duke Salvia, MD;  Location: [facility] INVASIVE CV  LAB;  Service: Cardiovascular;  Laterality: N/A;   INGUINAL HERNIA REPAIR Bilateral    KNEE ARTHROSCOPY Right    SHOULDER ARTHROSCOPY Left 09/2022   SHOULDER ARTHROSCOPY Left 08/02/2014   Procedure: LEFT SHOULDER SCOPE DEBRIDEMENT/ACROMIOPLASTY;  Surgeon: Teryl Lucy, MD;  Location: [facility];  Service: Orthopedics;  Laterality: Left;  ANESTHESIA: GENERAL, PRE/POST OP SCALENE   TEE WITHOUT CARDIOVERSION N/A 03/03/2015   Procedure: TRANSESOPHAGEAL ECHOCARDIOGRAM (TEE);  Surgeon: Laqueta Linden, MD;  [facility]: [facility];  Service: Cardiology;  Laterality: N/A;   TRIGGER FINGER RELEASE Left 12/22/2022   Procedure: LEFT A-1 PULLEY RELEASE LONG FINGER;  Surgeon: Tarry Kos, MD;  Location: [facility];  Service: Orthopedics;  Laterality: Left;   UMBILICAL HERNIA REPAIR     with other hernia repair with mesh   WRIST SURGERY Right    fusion   Patient Active Problem List   Diagnosis Date Noted   Impingement syndrome of right shoulder 01/25/2023   Nontraumatic incomplete tear of right rotator cuff 01/25/2023   Stenosing tenosynovitis of finger of left hand 12/22/2022   Hypercoagulable state due to paroxysmal atrial fibrillation (HCC) 07/19/2022   Nontraumatic incomplete tear of left rotator cuff 07/07/2022   Arthritis  of left acromioclavicular joint 07/07/2022   Right groin hernia 03/03/2021   Hyperlipidemia associated with type 2 diabetes mellitus (HCC) 12/25/2020   Tendinopathy of right rotator cuff 09/25/2020   Arthritis of right acromioclavicular joint 09/25/2020   Adrenal adenoma, left 09/07/2019   Chronic anticoagulation    GI bleed 06/14/2019   Vitamin D deficiency 04/18/2019   History of stroke 04/20/2018   GAD (generalized anxiety disorder) 05/15/2016   Acute bronchiolitis due to respiratory syncytial virus (RSV)    Acute CVA (cerebrovascular accident) (HCC) 05/04/2016   Dizziness 05/02/2016   Anxiety and depression 05/02/2016    Paroxysmal atrial fibrillation (HCC) 03/02/2016   PFO (patent foramen ovale) 07/24/2015   OSA (obstructive sleep apnea) 04/18/2015   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 Lilyan Punt REFERRING PROVIDER: Dr. Gershon Mussel  ONSET DATE: 03/02/23  REFERRING DIAG: Z61.096 (ICD-10-CM) - S/P arthroscopy of shoulder   THERAPY DIAG:  Acute pain of right shoulder  Other symptoms and signs involving the musculoskeletal system  Rationale for Evaluation and Treatment: Rehabilitation  SUBJECTIVE:   SUBJECTIVE STATEMENT: S: It's popping when I move it.  PERTINENT HISTORY: Pt is a 62 y/o male s/p right shoulder arthroscopy on 03/02/23 for extensive debridement, distal clavicle excision, and subacromial decompression. Marland Kitchen Pt presents without sling, reports he has been trying to move his arm. Pt reports high soreness, has been driving a zero turn lawnmower a little.   PRECAUTIONS: ShoulderProgress as tolerated: P/ROM to AA/ROM to A/ROM  WEIGHT BEARING RESTRICTIONS: No  PAIN:  Are you having pain? Yes: NPRS scale: 6/10 Pain location: right shoulder Pain description: sore Aggravating factors: movement Relieving factors: heat  FALLS: Has patient fallen in last 6 months? No  PLOF: Independent  PATIENT GOALS: To be able to use his RUE normally.   OBJECTIVE:  Note: Objective  measures were completed at Evaluation unless otherwise noted.  HAND DOMINANCE: Right  ADLs: Overall ADLs: Pt is having difficulty with reaching into overhead cabinets and behind back. Putting his belt through a loop is difficult, has difficulty reaching into microwave and dryer. Pt requires assistance with pull-over shirts. Pt with mod difficulty sleeping.   FUNCTIONAL OUTCOME MEASURES: FOTO: 53/100  UPPER EXTREMITY ROM:       Pt assessed in sitting, er/IR adducted  Active ROM Right eval  Shoulder flexion 110  Shoulder abduction 126  Shoulder internal rotation 90  Shoulder external rotation 58  (Blank rows = not tested)   UPPER EXTREMITY MMT:       Pt assessed via observations due to protocol  MMT Right eval  Shoulder flexion 3/5  Shoulder abduction 3/5  Shoulder internal rotation 3/5  Shoulder external rotation 3/5  (Blank rows = not tested)   OBSERVATIONS: mild/mod fascial restrictions in right shoulder, trapezius, and scapular regions   TODAY'S TREATMENT:  DATE:   04/14/23 -A/ROM: supine-protraction, flexion, abduction, er, horizontal abduction, 15 reps -Shoulder strengthening: 2#, supine-protraction, flexion, abduction, er, horizontal abduction, 12 reps -A/ROM: standing-protraction, flexion, abduction, er, horizontal abduction, 10 reps -Scapular Strengthening: red band, extension, retraction, rows, 12 reps  04/07/23 -A/ROM: supine-protraction, flexion, abduction, er, horizontal abduction, 12 reps -A/ROM: standing-protraction, flexion, abduction, er, horizontal abduction, 10 reps -Proximal shoulder strengthening: supine-paddles, criss cross, circles each direction, 10 reps -Serratus anterior punch: supine, 10 reps -A/ROM: standing-protraction, flexion, abduction, er, horizontal abduction, 10 reps -Proximal shoulder strengthening:  standing-paddles, criss cross, circles each direction, 10 reps -Wall wash: 1' flexion -Functional reaching: pt placing 10 cones on top shelf of overhead cabinet in flexion, removing in abduction -Cone pass behind back for IR, 10 reps, behind head for er, 10 reps  03/29/23 -Manual Therapy: myofascial release and trigger point applied to biceps, deltoid, scapular region, and trapezius, in order to reduce pain and fascial restrictions and improve ROM.  -AA/ROM: supine, flexion, abduction, protraction, horizontal abduction, er/IR, x12 -A/ROM: supine, flexion, abduction, protraction, horizontal abduction, er/IR, x10   PATIENT EDUCATION: Education details: Publishing rights manager Person educated: Patient Education method: Explanation, Demonstration, and Verbal cues Education comprehension: verbalized understanding and returned demonstration  HOME EXERCISE PROGRAM: Eval: A/ROM 11/26: Wall Slides 12/12: Scapular Strengthening   GOALS: Goals reviewed with patient? Yes  SHORT TERM GOALS: Target date: 04/13/23  Pt will be provided with and educated on HEP to improve mobility in RUE required for use during ADL completion.   Goal status: IN PROGRESS  2.  Pt will decrease RUE fascial restrictions to min amounts or less to improve mobility required for functional reaching tasks.   Goal status: IN PROGRESS   LONG TERM GOALS: Target date: 05/04/23  Pt will decrease pain in RUE to 4/10 or less to improve ability to sleep for 2+ consecutive hours without waking due to pain.   Goal status: IN PROGRESS  2.  Pt will increase RUE A/ROM by 25 degrees or greater to improve ability to use RUE when reaching overhead or behind back during dressing and bathing tasks.   Goal status: IN PROGRESS  3.  Pt will increase RUE strength to 4/5 or greater to improve ability to use RUE when lifting or carrying items during meal preparation/housework/yardwork tasks.   Goal status: IN  PROGRESS    ASSESSMENT:  CLINICAL IMPRESSION: This session, pt continues to present with increased stiffness and weakness. He requires increased time to achieve full ROM, and requires gravity eliminated plane (supine) for light 2# weights. Pt requiring 3 short rest breaks during supine strengthening due to muscle fatigue. OT added scapular strengthening with red band, to start low level strengthening. Verbal and tactile cuing for positioning and technique throughout session.   PERFORMANCE DEFICITS: in functional skills including ADLs, IADLs, ROM, strength, pain, fascial restrictions, and UE functional use   PLAN:  OT FREQUENCY: 1x/week  OT DURATION: 6 weeks  PLANNED INTERVENTIONS: 97168 OT Re-evaluation, 97535 self care/ADL training, 91478 therapeutic exercise, 97530 therapeutic activity, 97140 manual therapy, 97035 ultrasound, 97014 electrical stimulation unattended, patient/family education, and DME and/or AE instructions  CONSULTED AND AGREED WITH PLAN OF CARE: Patient  PLAN FOR NEXT SESSION: Follow up on HEP, manual techniques prn, passive stretching, A/ROM, scapular mobility and strengthening   Trish Mage, OTR/L [phone] 04/14/2023, 11:52 AM

## 2023-04-14 NOTE — Patient Instructions (Signed)
1) (Home) Extension: Isometric / Bilateral Arm Retraction - Sitting   Facing anchor, hold hands and elbow at shoulder height, with elbow bent.  Pull arms back to squeeze shoulder blades together. Repeat 10-15 times. 1-3 times/day.   2) (Clinic) Extension / Flexion (Assist)   Face anchor, pull arms back, keeping elbow straight, and squeze shoulder blades together. Repeat 10-15 times. 1-3 times/day.   Copyright  VHI. All rights reserved.   3) (Home) Retraction: Row - Bilateral (Anchor)   Facing anchor, arms reaching forward, pull hands toward stomach, keeping elbows bent and at your sides and pinching shoulder blades together. Repeat 10-15 times. 1-3 times/day.   Copyright  VHI. All rights reserved.   

## 2023-04-15 ENCOUNTER — Other Ambulatory Visit (HOSPITAL_COMMUNITY): Payer: Self-pay

## 2023-04-16 ENCOUNTER — Other Ambulatory Visit (HOSPITAL_COMMUNITY): Payer: Self-pay

## 2023-04-16 MED FILL — Folic Acid Tab 1 MG: ORAL | 30 days supply | Qty: 30 | Fill #1 | Status: AC

## 2023-04-16 MED FILL — Folic Acid Tab 1 MG: ORAL | 30 days supply | Qty: 30 | Fill #1 | Status: CN

## 2023-04-19 ENCOUNTER — Encounter (HOSPITAL_COMMUNITY): Payer: 59 | Admitting: Occupational Therapy

## 2023-04-21 ENCOUNTER — Encounter (HOSPITAL_COMMUNITY): Payer: Self-pay | Admitting: Occupational Therapy

## 2023-04-21 ENCOUNTER — Ambulatory Visit (HOSPITAL_COMMUNITY): Payer: 59 | Admitting: Occupational Therapy

## 2023-04-21 DIAGNOSIS — M25511 Pain in right shoulder: Secondary | ICD-10-CM | POA: Diagnosis not present

## 2023-04-21 DIAGNOSIS — M25512 Pain in left shoulder: Secondary | ICD-10-CM | POA: Diagnosis not present

## 2023-04-21 DIAGNOSIS — M25612 Stiffness of left shoulder, not elsewhere classified: Secondary | ICD-10-CM | POA: Diagnosis not present

## 2023-04-21 DIAGNOSIS — R29898 Other symptoms and signs involving the musculoskeletal system: Secondary | ICD-10-CM

## 2023-04-21 NOTE — Therapy (Signed)
OUTPATIENT OCCUPATIONAL THERAPY ORTHO TREATMENT NOTE  Patient Name: Travis [name] MRN: [id number] DOB:1961/03/20, 62 y.o., male Today's Date: 04/21/2023    END OF SESSION:  OT End of Session - 04/21/23 1149     Visit Number 5    Number of Visits 6    Date for OT Re-Evaluation 05/04/23    Authorization Type [employer] Employee-$40 copay    OT Start Time 1106    OT Stop Time 1148    OT Time Calculation (min) 42 min    Activity Tolerance Patient tolerated treatment well    Behavior During Therapy [facility] for tasks assessed/performed              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   Anxiety    Arthritis    Blood transfusion without reported diagnosis    Complication of anesthesia    bleed after last shoulder-had to stay overnight   Depression    Diabetes mellitus    Diabetic peripheral neuropathy (HCC) 03/28/2013   Diverticulosis    Dizziness    Hernia, inguinal    Hyperlipidemia    Hypertension    Impingement syndrome of left shoulder 08/02/2014   Ischemic colitis (HCC)    Multi-infarct state 10/14/2014   Neuropathy of lower extremity    Night sweats    every once in a while   Sleep apnea    no CPAP   Stroke (HCC) 02/2015   no deficits   Syncope and collapse    Past Surgical History:  Procedure Laterality Date   BIOPSY  06/14/2019   Procedure: BIOPSY;  Surgeon: Sherrilyn Rist, MD;  Location: [facility];  Service: Gastroenterology;;   CERVICAL DISC SURGERY     COLON SURGERY  05/03/2001   1/3 removed for diverticulitis   COLONOSCOPY     COLONOSCOPY WITH PROPOFOL N/A 06/14/2019   Procedure: COLONOSCOPY WITH PROPOFOL;  Surgeon: Sherrilyn Rist, MD;  Location: [facility];  Service: Gastroenterology;  Laterality: N/A;   EP IMPLANTABLE DEVICE N/A 05/01/2015   Procedure: Loop Recorder Insertion;  Surgeon: Duke Salvia, MD;  Location: [facility] INVASIVE CV  LAB;  Service: Cardiovascular;  Laterality: N/A;   INGUINAL HERNIA REPAIR Bilateral    KNEE ARTHROSCOPY Right    SHOULDER ARTHROSCOPY Left 09/2022   SHOULDER ARTHROSCOPY Left 08/02/2014   Procedure: LEFT SHOULDER SCOPE DEBRIDEMENT/ACROMIOPLASTY;  Surgeon: Teryl Lucy, MD;  Location: [facility];  Service: Orthopedics;  Laterality: Left;  ANESTHESIA: GENERAL, PRE/POST OP SCALENE   TEE WITHOUT CARDIOVERSION N/A 03/03/2015   Procedure: TRANSESOPHAGEAL ECHOCARDIOGRAM (TEE);  Surgeon: Laqueta Linden, MD;  [facility]: [facility];  Service: Cardiology;  Laterality: N/A;   TRIGGER FINGER RELEASE Left 12/22/2022   Procedure: LEFT A-1 PULLEY RELEASE LONG FINGER;  Surgeon: Tarry Kos, MD;  Location: [facility];  Service: Orthopedics;  Laterality: Left;   UMBILICAL HERNIA REPAIR     with other hernia repair with mesh   WRIST SURGERY Right    fusion   Patient Active Problem List   Diagnosis Date Noted   Impingement syndrome of right shoulder 01/25/2023   Nontraumatic incomplete tear of right rotator cuff 01/25/2023   Stenosing tenosynovitis of finger of left hand 12/22/2022   Hypercoagulable state due to paroxysmal atrial fibrillation (HCC) 07/19/2022   Nontraumatic incomplete tear of left rotator cuff 07/07/2022   Arthritis  of left acromioclavicular joint 07/07/2022   Right groin hernia 03/03/2021   Hyperlipidemia associated with type 2 diabetes mellitus (HCC) 12/25/2020   Tendinopathy of right rotator cuff 09/25/2020   Arthritis of right acromioclavicular joint 09/25/2020   Adrenal adenoma, left 09/07/2019   Chronic anticoagulation    GI bleed 06/14/2019   Vitamin D deficiency 04/18/2019   History of stroke 04/20/2018   GAD (generalized anxiety disorder) 05/15/2016   Acute bronchiolitis due to respiratory syncytial virus (RSV)    Acute CVA (cerebrovascular accident) (HCC) 05/04/2016   Dizziness 05/02/2016   Anxiety and depression 05/02/2016    Paroxysmal atrial fibrillation (HCC) 03/02/2016   PFO (patent foramen ovale) 07/24/2015   OSA (obstructive sleep apnea) 04/18/2015   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 Lilyan Punt REFERRING PROVIDER: Dr. Gershon Mussel  ONSET DATE: 03/02/23  REFERRING DIAG: Z61.096 (ICD-10-CM) - S/P arthroscopy of shoulder   THERAPY DIAG:  Acute pain of right shoulder  Other symptoms and signs involving the musculoskeletal system  Rationale for Evaluation and Treatment: Rehabilitation  SUBJECTIVE:   SUBJECTIVE STATEMENT: S: This weather is killing me.  PERTINENT HISTORY: Pt is a 62 y/o male s/p right shoulder arthroscopy on 03/02/23 for extensive debridement, distal clavicle excision, and subacromial decompression. Marland Kitchen Pt presents without sling, reports he has been trying to move his arm. Pt reports high soreness, has been driving a zero turn lawnmower a little.   PRECAUTIONS: ShoulderProgress as tolerated: P/ROM to AA/ROM to A/ROM  WEIGHT BEARING RESTRICTIONS: No  PAIN:  Are you having pain? Yes: NPRS scale: 5/10 Pain location: right shoulder Pain description: sore Aggravating factors: movement Relieving factors: heat  FALLS: Has patient fallen in last 6 months? No  PLOF: Independent  PATIENT GOALS: To be able to use his RUE normally.   OBJECTIVE:  Note: Objective  measures were completed at Evaluation unless otherwise noted.  HAND DOMINANCE: Right  ADLs: Overall ADLs: Pt is having difficulty with reaching into overhead cabinets and behind back. Putting his belt through a loop is difficult, has difficulty reaching into microwave and dryer. Pt requires assistance with pull-over shirts. Pt with mod difficulty sleeping.   FUNCTIONAL OUTCOME MEASURES: FOTO: 53/100  UPPER EXTREMITY ROM:       Pt assessed in sitting, er/IR adducted  Active ROM Right eval  Shoulder flexion 110  Shoulder abduction 126  Shoulder internal rotation 90  Shoulder external rotation 58  (Blank rows = not tested)   UPPER EXTREMITY MMT:       Pt assessed via observations due to protocol  MMT Right eval  Shoulder flexion 3/5  Shoulder abduction 3/5  Shoulder internal rotation 3/5  Shoulder external rotation 3/5  (Blank rows = not tested)   OBSERVATIONS: mild/mod fascial restrictions in right shoulder, trapezius, and scapular regions   TODAY'S TREATMENT:  DATE:   04/21/23 -AA/ROM: seated, flexion, abduction, protraction, horizontal abduction, er/IR, x10 -A/ROM: seated, flexion, abduction, protraction, horizontal abduction, er/IR, x12 -Proximal Shoulder Exercises: paddles, criss cross, circles both directions, x10 -PNF Strengthening: red band, chest pulls, overhead pulls, er pulls, PNF up, PNF down, x10 -UBE: Level 2, 2.5' forwards and backwards  04/14/23 -A/ROM: supine-protraction, flexion, abduction, er, horizontal abduction, 15 reps -Shoulder strengthening: 2#, supine-protraction, flexion, abduction, er, horizontal abduction, 12 reps -A/ROM: standing-protraction, flexion, abduction, er, horizontal abduction, 10 reps -Scapular Strengthening: red band, extension, retraction, rows, 12 reps  04/07/23 -A/ROM: supine-protraction, flexion,  abduction, er, horizontal abduction, 12 reps -A/ROM: standing-protraction, flexion, abduction, er, horizontal abduction, 10 reps -Proximal shoulder strengthening: supine-paddles, criss cross, circles each direction, 10 reps -Serratus anterior punch: supine, 10 reps -A/ROM: standing-protraction, flexion, abduction, er, horizontal abduction, 10 reps -Proximal shoulder strengthening: standing-paddles, criss cross, circles each direction, 10 reps -Wall wash: 1' flexion -Functional reaching: pt placing 10 cones on top shelf of overhead cabinet in flexion, removing in abduction -Cone pass behind back for IR, 10 reps, behind head for er, 10 reps   PATIENT EDUCATION: Education details: PNF Strengthening Person educated: Patient Education method: Programmer, multimedia, Demonstration, and Verbal cues Education comprehension: verbalized understanding and returned demonstration  HOME EXERCISE PROGRAM: Eval: A/ROM 11/26: Wall Slides 12/12: Scapular Strengthening 12/19: PNF Strengthening   GOALS: Goals reviewed with patient? Yes  SHORT TERM GOALS: Target date: 04/13/23  Pt will be provided with and educated on HEP to improve mobility in RUE required for use during ADL completion.   Goal status: IN PROGRESS  2.  Pt will decrease RUE fascial restrictions to min amounts or less to improve mobility required for functional reaching tasks.   Goal status: IN PROGRESS   LONG TERM GOALS: Target date: 05/04/23  Pt will decrease pain in RUE to 4/10 or less to improve ability to sleep for 2+ consecutive hours without waking due to pain.   Goal status: IN PROGRESS  2.  Pt will increase RUE A/ROM by 25 degrees or greater to improve ability to use RUE when reaching overhead or behind back during dressing and bathing tasks.   Goal status: IN PROGRESS  3.  Pt will increase RUE strength to 4/5 or greater to improve ability to use RUE when lifting or carrying items during meal preparation/housework/yardwork  tasks.   Goal status: IN PROGRESS    ASSESSMENT:  CLINICAL IMPRESSION: Pt continuing to demonstrate improving ROM and overall control/endurance. He is able to complete all ROM exercises with no assist, against gravity, at full ROM. OT adding PNF strengthening for continuing overall strength in the shoulder. Verbal and tactile cuing for positioning and technique throughout session.   PERFORMANCE DEFICITS: in functional skills including ADLs, IADLs, ROM, strength, pain, fascial restrictions, and UE functional use   PLAN:  OT FREQUENCY: 1x/week  OT DURATION: 6 weeks  PLANNED INTERVENTIONS: 97168 OT Re-evaluation, 97535 self care/ADL training, 64332 therapeutic exercise, 97530 therapeutic activity, 97140 manual therapy, 97035 ultrasound, 97014 electrical stimulation unattended, patient/family education, and DME and/or AE instructions  CONSULTED AND AGREED WITH PLAN OF CARE: Patient  PLAN FOR NEXT SESSION: Follow up on HEP, manual techniques prn, passive stretching, A/ROM, scapular mobility and strengthening   Trish Mage, OTR/L [phone] 04/21/2023, 11:49 AM

## 2023-04-21 NOTE — Patient Instructions (Signed)

## 2023-04-28 ENCOUNTER — Ambulatory Visit (HOSPITAL_COMMUNITY): Payer: 59 | Admitting: Occupational Therapy

## 2023-04-28 ENCOUNTER — Encounter (HOSPITAL_COMMUNITY): Payer: Self-pay | Admitting: Occupational Therapy

## 2023-04-28 DIAGNOSIS — M25612 Stiffness of left shoulder, not elsewhere classified: Secondary | ICD-10-CM | POA: Diagnosis not present

## 2023-04-28 DIAGNOSIS — M25512 Pain in left shoulder: Secondary | ICD-10-CM | POA: Diagnosis not present

## 2023-04-28 DIAGNOSIS — R29898 Other symptoms and signs involving the musculoskeletal system: Secondary | ICD-10-CM | POA: Diagnosis not present

## 2023-04-28 DIAGNOSIS — M25511 Pain in right shoulder: Secondary | ICD-10-CM | POA: Diagnosis not present

## 2023-04-28 NOTE — Therapy (Signed)
OUTPATIENT OCCUPATIONAL THERAPY ORTHO TREATMENT NOTE  Patient Name: Travis [name] MRN: [id number] DOB:02-17-1961, 61 y.o., male Today's Date: 04/28/2023    END OF SESSION:  OT End of Session - 04/28/23 1130     Visit Number 6    Number of Visits 6    Date for OT Re-Evaluation 05/04/23    Authorization Type [employer]-$40 copay    OT Start Time 1126    OT Stop Time 1207    OT Time Calculation (min) 41 min    Activity Tolerance Patient tolerated treatment well    Behavior During Therapy [facility] for tasks assessed/performed               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   Anxiety    Arthritis    Blood transfusion without reported diagnosis    Complication of anesthesia    bleed after last shoulder-had to stay overnight   Depression    Diabetes mellitus    Diabetic peripheral neuropathy (HCC) 03/28/2013   Diverticulosis    Dizziness    Hernia, inguinal    Hyperlipidemia    Hypertension    Impingement syndrome of left shoulder 08/02/2014   Ischemic colitis (HCC)    Multi-infarct state 10/14/2014   Neuropathy of lower extremity    Night sweats    every once in a while   Sleep apnea    no CPAP   Stroke (HCC) 02/2015   no deficits   Syncope and collapse    Past Surgical History:  Procedure Laterality Date   BIOPSY  06/14/2019   Procedure: BIOPSY;  Surgeon: Sherrilyn Rist, MD;  Location: [facility];  Service: Gastroenterology;;   CERVICAL DISC SURGERY     COLON SURGERY  05/03/2001   1/3 removed for diverticulitis   COLONOSCOPY     COLONOSCOPY WITH PROPOFOL N/A 06/14/2019   Procedure: COLONOSCOPY WITH PROPOFOL;  Surgeon: Sherrilyn Rist, MD;  Location: [facility];  Service: Gastroenterology;  Laterality: N/A;   EP IMPLANTABLE DEVICE N/A 05/01/2015   Procedure: Loop Recorder Insertion;  Surgeon: Duke Salvia, MD;  Location: [facility] INVASIVE  CV LAB;  Service: Cardiovascular;  Laterality: N/A;   INGUINAL HERNIA REPAIR Bilateral    KNEE ARTHROSCOPY Right    SHOULDER ARTHROSCOPY Left 09/2022   SHOULDER ARTHROSCOPY Left 08/02/2014   Procedure: LEFT SHOULDER SCOPE DEBRIDEMENT/ACROMIOPLASTY;  Surgeon: Teryl Lucy, MD;  Location: [facility];  Service: Orthopedics;  Laterality: Left;  ANESTHESIA: GENERAL, PRE/POST OP SCALENE   TEE WITHOUT CARDIOVERSION N/A 03/03/2015   Procedure: TRANSESOPHAGEAL ECHOCARDIOGRAM (TEE);  Surgeon: Laqueta Linden, MD;  Location: [facility];  Service: Cardiology;  Laterality: N/A;   TRIGGER FINGER RELEASE Left 12/22/2022   Procedure: LEFT A-1 PULLEY RELEASE LONG FINGER;  Surgeon: Tarry Kos, MD;  Location: [facility];  Service: Orthopedics;  Laterality: Left;   UMBILICAL HERNIA REPAIR     with other hernia repair with mesh   WRIST SURGERY Right    fusion   Patient Active Problem List   Diagnosis Date Noted   Impingement syndrome of right shoulder 01/25/2023   Nontraumatic incomplete tear of right rotator cuff 01/25/2023   Stenosing tenosynovitis of finger of left hand 12/22/2022   Hypercoagulable state due to paroxysmal atrial fibrillation (HCC) 07/19/2022   Nontraumatic incomplete tear of left rotator cuff 07/07/2022  Arthritis of left acromioclavicular joint 07/07/2022   Right groin hernia 03/03/2021   Hyperlipidemia associated with type 2 diabetes mellitus (HCC) 12/25/2020   Tendinopathy of right rotator cuff 09/25/2020   Arthritis of right acromioclavicular joint 09/25/2020   Adrenal adenoma, left 09/07/2019   Chronic anticoagulation    GI bleed 06/14/2019   Vitamin D deficiency 04/18/2019   History of stroke 04/20/2018   GAD (generalized anxiety disorder) 05/15/2016   Acute bronchiolitis due to respiratory syncytial virus (RSV)    Acute CVA (cerebrovascular accident) (HCC) 05/04/2016   Dizziness 05/02/2016   Anxiety and depression 05/02/2016    Paroxysmal atrial fibrillation (HCC) 03/02/2016   PFO (patent foramen ovale) 07/24/2015   OSA (obstructive sleep apnea) 04/18/2015   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 Lilyan Punt REFERRING PROVIDER: Dr. Gershon Mussel  ONSET DATE: 03/02/23  REFERRING DIAG: Z61.096 (ICD-10-CM) - S/P arthroscopy of shoulder   THERAPY DIAG:  Acute pain of right shoulder  Other symptoms and signs involving the musculoskeletal system  Rationale for Evaluation and Treatment: Rehabilitation  SUBJECTIVE:   SUBJECTIVE STATEMENT: S: I'm sore as snot this morning.   PERTINENT HISTORY: Pt is a 62 y/o male s/p right shoulder arthroscopy on 03/02/23 for extensive debridement, distal clavicle excision, and subacromial decompression. Marland Kitchen Pt presents without sling, reports he has been trying to move his arm. Pt reports high soreness, has been driving a zero turn lawnmower a little.   PRECAUTIONS: Shoulder Progress as tolerated: P/ROM to AA/ROM to A/ROM  WEIGHT BEARING RESTRICTIONS: No  PAIN:  Are you having pain? Yes: NPRS scale: 4/10 Pain location: right shoulder Pain description: sore Aggravating factors: movement Relieving factors: heat  FALLS: Has patient fallen in last 6 months? No  PLOF: Independent  PATIENT GOALS: To be able to use his RUE normally.   OBJECTIVE:  Note: Objective  measures were completed at Evaluation unless otherwise noted.  HAND DOMINANCE: Right  ADLs: Overall ADLs: Pt is having difficulty with reaching into overhead cabinets and behind back. Putting his belt through a loop is difficult, has difficulty reaching into microwave and dryer. Pt requires assistance with pull-over shirts. Pt with mod difficulty sleeping.   FUNCTIONAL OUTCOME MEASURES: FOTO: 53/100  UPPER EXTREMITY ROM:       Pt assessed in sitting, er/IR adducted  Active ROM Right eval  Shoulder flexion 110  Shoulder abduction 126  Shoulder internal rotation 90  Shoulder external rotation 58  (Blank rows = not tested)   UPPER EXTREMITY MMT:       Pt assessed via observations due to protocol  MMT Right eval  Shoulder flexion 3/5  Shoulder abduction 3/5  Shoulder internal rotation 3/5  Shoulder external rotation 3/5  (Blank rows = not tested)   OBSERVATIONS: mild/mod fascial restrictions in right shoulder, trapezius, and scapular regions   TODAY'S TREATMENT:  DATE:  04/28/23 -A/ROM: supine-protraction, flexion, abduction, er, horizontal abduction, 15 reps -Proximal shoulder strengthening: supine-paddles, criss cross, circles each direction, 12 reps, 1 rest break -A/ROM: standing-protraction, flexion, abduction, er, horizontal abduction, 15 reps -Proximal shoulder strengthening: standing-paddles, criss cross, circles each direction, 12 reps -Proximal shoulder strengthening at doorway, 1', 90 degrees flexion -Washcloth pass behind back right to left for IR, behind head right to left for er, 10 reps -Scapular Strengthening: red band, extension, retraction, rows, 12 reps -Overhead lacing: seated-lacing from top down then reversing -UBE: Level 2, 3' forwards and 3' backwards, pace: 11.0  04/21/23 -AA/ROM: seated, flexion, abduction, protraction,  horizontal abduction, er/IR, x10 -A/ROM: seated, flexion, abduction, protraction, horizontal abduction, er/IR, x12 -Proximal Shoulder Exercises: paddles, criss cross, circles both directions, x10 -PNF Strengthening: red band, chest pulls, overhead pulls, er pulls, PNF up, PNF down, x10 -UBE: Level 2, 2.5' forwards and backwards  04/14/23 -A/ROM: supine-protraction, flexion, abduction, er, horizontal abduction, 15 reps -Shoulder strengthening: 2#, supine-protraction, flexion, abduction, er, horizontal abduction, 12 reps -A/ROM: standing-protraction, flexion, abduction, er, horizontal abduction, 10 reps -Scapular Strengthening: red band, extension, retraction, rows, 12 reps   PATIENT EDUCATION: Education details: Reviewed HEP Person educated: Patient Education method: Explanation, Demonstration, and Verbal cues Education comprehension: verbalized understanding and returned demonstration  HOME EXERCISE PROGRAM: Eval: A/ROM 11/26: Wall Slides 12/12: Scapular Strengthening 12/19: PNF Strengthening   GOALS: Goals reviewed with patient? Yes  SHORT TERM GOALS: Target date: 04/13/23  Pt will be provided with and educated on HEP to improve mobility in RUE required for use during ADL completion.   Goal status: IN PROGRESS  2.  Pt will decrease RUE fascial restrictions to min amounts or less to improve mobility required for functional reaching tasks.   Goal status: IN PROGRESS   LONG TERM GOALS: Target date: 05/04/23  Pt will decrease pain in RUE to 4/10 or less to improve ability to sleep for 2+ consecutive hours without waking due to pain.   Goal status: IN PROGRESS  2.  Pt will increase RUE A/ROM by 25 degrees or greater to improve ability to use RUE when reaching overhead or behind back during dressing and bathing tasks.   Goal status: IN PROGRESS  3.  Pt will increase RUE strength to 4/5 or greater to improve ability to use RUE when lifting or carrying items during meal  preparation/housework/yardwork tasks.   Goal status: IN PROGRESS    ASSESSMENT:  CLINICAL IMPRESSION: Pt reports his HEP is going well, uses a heating pad at times due to soreness. Increased all A/ROM reps to 15 today, continued with proximal shoulder strengthening and scapular strengthening. Added overhead lacing and proximal shoulder strengthening at doorway. Focus on form throughout exercises. Activity tolerance is improving, min rest breaks required. Verbal cuing for form and technique.   PERFORMANCE DEFICITS: in functional skills including ADLs, IADLs, ROM, strength, pain, fascial restrictions, and UE functional use   PLAN:  OT FREQUENCY: 1x/week  OT DURATION: 6 weeks  PLANNED INTERVENTIONS: 97168 OT Re-evaluation, 97535 self care/ADL training, 29528 therapeutic exercise, 97530 therapeutic activity, 97140 manual therapy, 97035 ultrasound, 97014 electrical stimulation unattended, patient/family education, and DME and/or AE instructions  CONSULTED AND AGREED WITH PLAN OF CARE: Patient  PLAN FOR NEXT SESSION: REASSESSMENT   Ezra Sites, OTR/L  [phone] 04/28/2023, 12:08 PM

## 2023-05-03 ENCOUNTER — Encounter (HOSPITAL_COMMUNITY): Payer: Self-pay | Admitting: Occupational Therapy

## 2023-05-03 ENCOUNTER — Other Ambulatory Visit (HOSPITAL_COMMUNITY): Payer: Self-pay

## 2023-05-03 ENCOUNTER — Ambulatory Visit (HOSPITAL_COMMUNITY): Payer: 59 | Admitting: Occupational Therapy

## 2023-05-03 DIAGNOSIS — M25612 Stiffness of left shoulder, not elsewhere classified: Secondary | ICD-10-CM | POA: Diagnosis not present

## 2023-05-03 DIAGNOSIS — R29898 Other symptoms and signs involving the musculoskeletal system: Secondary | ICD-10-CM | POA: Diagnosis not present

## 2023-05-03 DIAGNOSIS — M25512 Pain in left shoulder: Secondary | ICD-10-CM | POA: Diagnosis not present

## 2023-05-03 DIAGNOSIS — M25511 Pain in right shoulder: Secondary | ICD-10-CM | POA: Diagnosis not present

## 2023-05-03 NOTE — Patient Instructions (Signed)
 Theraband strengthening: Complete 10-15X, 1-2X/day  1) Shoulder protraction  Anchor band in doorway, stand with back to door. Push your hand forward as much as you can to bringing your shoulder blades forward on your rib cage.      2) Shoulder horizontal abduction  Standing with a theraband anchored at chest height, begin with arm straight and some tension in the band. Move your arm out to your side (keeping straight the whole time). Bring the affected arm back to midline.     3) Shoulder Internal Rotation  While holding an elastic band at your side with your elbow bent, start with your hand away from your stomach, then pull the band towards your stomach. Keep your elbow near your side the entire time.     4) Shoulder External Rotation  While holding an elastic band at your side with your elbow bent, start with your hand near your stomach and then pull the band away. Keep your elbow at your side the entire time.     5) Shoulder flexion  While standing with back to the door, holding Theraband at hand level, raise arm in front of you.  Keep elbow straight through entire movement.      6) Shoulder abduction  While holding an elastic band at your side, draw up your arm to the side keeping your elbow straight.

## 2023-05-03 NOTE — Therapy (Signed)
 OUTPATIENT OCCUPATIONAL THERAPY ORTHO TREATMENT NOTE REASSESSMENT & DISCHARGE SUMMARY  Patient Name: Travis [name] MRN: [id number] DOB:[birth year], 62 y.o., male Today's Date: 05/03/2023   OCCUPATIONAL THERAPY DISCHARGE SUMMARY  Visits from Start of Care: 7  Current functional level related to goals / functional outcomes: See clinical impression below. Pt has met 2/2 STGs and 2/3 LTGs, demonstrating improved ROM, strength, and functional use of the RUE during ADLs.    Remaining deficits: Decreased strength   Education / Equipment: HEP for continued strengthening   Patient agrees to discharge. Patient goals were met. Patient is being discharged due to being pleased with the current functional level..     END OF SESSION:  OT End of Session - 05/03/23 1220     Visit Number 7    Number of Visits 6    Date for OT Re-Evaluation 05/04/23    Authorization Type [employer] Employee-$40 copay    OT Start Time 1150    OT Stop Time 1218    OT Time Calculation (min) 28 min    Activity Tolerance Patient tolerated treatment well    Behavior During Therapy WFL for tasks assessed/performed                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   Anxiety    Arthritis    Blood transfusion without reported diagnosis    Complication of anesthesia    bleed after last shoulder-had to stay overnight   Depression    Diabetes mellitus    Diabetic peripheral neuropathy (HCC) 03/28/2013   Diverticulosis    Dizziness    Hernia, inguinal    Hyperlipidemia    Hypertension    Impingement syndrome of left shoulder 08/02/2014   Ischemic colitis (HCC)    Multi-infarct state 10/14/2014   Neuropathy of lower extremity    Night sweats    every once in a while   Sleep apnea    no CPAP   Stroke (HCC) 02/2015   no deficits   Syncope and collapse    Past Surgical History:  Procedure  Laterality Date   BIOPSY  06/14/2019   Procedure: BIOPSY;  Surgeon: Legrand Victory LITTIE DOUGLAS, MD;  Location: [facility];  Service: Gastroenterology;;   CERVICAL DISC SURGERY     COLON SURGERY  05/03/2001   1/3 removed for diverticulitis   COLONOSCOPY     COLONOSCOPY WITH PROPOFOL  N/A 06/14/2019   Procedure: COLONOSCOPY WITH PROPOFOL ;  Surgeon: Legrand Victory LITTIE DOUGLAS, MD;  Location: [facility];  Service: Gastroenterology;  Laterality: N/A;   EP IMPLANTABLE DEVICE N/A 05/01/2015   Procedure: Loop Recorder Insertion;  Surgeon: Elspeth JAYSON Sage, MD;  Location: [facility] INVASIVE CV LAB;  Service: Cardiovascular;  Laterality: N/A;   INGUINAL HERNIA REPAIR Bilateral    KNEE ARTHROSCOPY Right    SHOULDER ARTHROSCOPY Left 09/2022   SHOULDER ARTHROSCOPY Left 08/02/2014   Procedure: LEFT SHOULDER SCOPE DEBRIDEMENT/ACROMIOPLASTY;  Surgeon: Fonda Olmsted, MD;  Location: [facility];  Service: Orthopedics;  Laterality: Left;  ANESTHESIA: GENERAL, PRE/POST OP SCALENE   TEE WITHOUT CARDIOVERSION N/A 03/03/2015   Procedure: TRANSESOPHAGEAL ECHOCARDIOGRAM (TEE);  Surgeon: Pearla DELENA Rout, MD;  [facility]: [facility];  Service: Cardiology;  Laterality: N/A;   TRIGGER FINGER RELEASE Left 12/22/2022   Procedure: LEFT A-1 PULLEY RELEASE LONG FINGER;  Surgeon: Jerri Kay HERO, MD;  Location: [facility];  Service: Orthopedics;  Laterality: Left;   UMBILICAL HERNIA REPAIR     with other hernia repair with mesh   WRIST SURGERY Right    fusion   Patient Active Problem List   Diagnosis Date Noted   Impingement syndrome of right shoulder 01/25/2023   Nontraumatic incomplete tear of right rotator cuff 01/25/2023   Stenosing tenosynovitis of finger of left hand 12/22/2022   Hypercoagulable state due to paroxysmal atrial fibrillation (HCC) 07/19/2022   Nontraumatic incomplete tear of left rotator cuff 07/07/2022   Arthritis of left acromioclavicular joint 07/07/2022   Right groin hernia 03/03/2021    Hyperlipidemia associated with type 2 diabetes mellitus (HCC) 12/25/2020   Tendinopathy of right rotator cuff 09/25/2020   Arthritis of right acromioclavicular joint 09/25/2020   Adrenal adenoma, left 09/07/2019   Chronic anticoagulation    GI bleed 06/14/2019   Vitamin D  deficiency 04/18/2019   History of stroke 04/20/2018   GAD (generalized anxiety disorder) 05/15/2016   Acute bronchiolitis due to respiratory syncytial virus (RSV)    Acute CVA (cerebrovascular accident) (HCC) 05/04/2016   Dizziness 05/02/2016   Anxiety and depression 05/02/2016   Paroxysmal atrial fibrillation (HCC) 03/02/2016   PFO (patent foramen ovale) 07/24/2015   OSA (obstructive sleep apnea) 04/18/2015   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 Glendia Fielding REFERRING PROVIDER: Dr. Kay Cummins  ONSET DATE: 03/02/23  REFERRING DIAG: S01.109 (ICD-10-CM) - S/P arthroscopy of shoulder   THERAPY DIAG:  Acute pain of right shoulder  Other symptoms and signs involving the musculoskeletal system  Acute pain of left shoulder  Stiffness of left shoulder, not elsewhere classified  Rationale for Evaluation and Treatment: Rehabilitation  SUBJECTIVE:   SUBJECTIVE STATEMENT: S: It feels better but it's sore.   PERTINENT HISTORY: Pt is a 62 y/o male s/p right shoulder arthroscopy on 03/02/23 for  extensive debridement, distal clavicle excision, and subacromial decompression. SABRA Pt presents without sling, reports he has been trying to move his arm. Pt reports high soreness, has been driving a zero turn lawnmower a little.   PRECAUTIONS: Shoulder Progress as tolerated: P/ROM to AA/ROM to A/ROM  WEIGHT BEARING RESTRICTIONS: No  PAIN:  Are you having pain? Yes: NPRS scale: 3/10 Pain location: right shoulder Pain description: sore Aggravating factors: movement Relieving factors: heat  FALLS: Has patient fallen in last 6 months? No  PLOF: Independent  PATIENT GOALS: To be able to use his RUE normally.   OBJECTIVE:  Note: Objective measures were completed at Evaluation unless otherwise noted.  HAND DOMINANCE: Right  ADLs: Overall ADLs: Pt is having difficulty with reaching into overhead cabinets and behind back. Putting his belt through a loop is difficult, has difficulty reaching into microwave and dryer. Pt requires assistance with pull-over shirts. Pt with mod difficulty sleeping.   FUNCTIONAL OUTCOME MEASURES: FOTO: 53/100 12/31: 63/100  UPPER EXTREMITY ROM:       Pt assessed in sitting, er/IR  adducted  Active ROM Right eval Right 05/03/23  Shoulder flexion 110 121  Shoulder abduction 126 146  Shoulder internal rotation 90 90  Shoulder external rotation 58 71  (Blank rows = not tested)   UPPER EXTREMITY MMT:       Pt assessed via observations due to protocol   05/03/23: MMT completed  MMT Right eval Right 05/03/23  Shoulder flexion 3/5 4/5  Shoulder abduction 3/5 4/5  Shoulder internal rotation 3/5 5/5  Shoulder external rotation 3/5 4/5  (Blank rows = not tested)   OBSERVATIONS: mild/mod fascial restrictions in right shoulder, trapezius, and scapular regions   TODAY'S TREATMENT:                                                                                                                              DATE:  05/03/23 -Theraband strengthening:  green-protraction, flexion, abduction, horizontal abduction, er, IR, 10 reps  04/28/23 -A/ROM: supine-protraction, flexion, abduction, er, horizontal abduction, 15 reps -Proximal shoulder strengthening: supine-paddles, criss cross, circles each direction, 12 reps, 1 rest break -A/ROM: standing-protraction, flexion, abduction, er, horizontal abduction, 15 reps -Proximal shoulder strengthening: standing-paddles, criss cross, circles each direction, 12 reps -Proximal shoulder strengthening at doorway, 1', 90 degrees flexion -Washcloth pass behind back right to left for IR, behind head right to left for er, 10 reps -Scapular Strengthening: red band, extension, retraction, rows, 12 reps -Overhead lacing: seated-lacing from top down then reversing -UBE: Level 2, 3' forwards and 3' backwards, pace: 11.0  04/21/23 -AA/ROM: seated, flexion, abduction, protraction, horizontal abduction, er/IR, x10 -A/ROM: seated, flexion, abduction, protraction, horizontal abduction, er/IR, x12 -Proximal Shoulder Exercises: paddles, criss cross, circles both directions, x10 -PNF Strengthening: red band, chest pulls, overhead pulls, er pulls, PNF up, PNF down, x10 -UBE: Level 2, 2.5' forwards and backwards    PATIENT EDUCATION: Education details: red & green theraband strengthening Person educated: Patient Education method: Explanation, Demonstration, and Verbal cues Education comprehension: verbalized understanding and returned demonstration  HOME EXERCISE PROGRAM: Eval: A/ROM 11/26: Wall Slides 12/12: Scapular Strengthening 12/19: PNF Strengthening 12/31: red & green theraband shoulder strengthening   GOALS: Goals reviewed with patient? Yes  SHORT TERM GOALS: Target date: 04/13/23  Pt will be provided with and educated on HEP to improve mobility in RUE required for use during ADL completion.   Goal status: MET  2.  Pt will decrease RUE fascial restrictions to min amounts or less to improve  mobility required for functional reaching tasks.   Goal status: MET   LONG TERM GOALS: Target date: 05/04/23  Pt will decrease pain in RUE to 4/10 or less to improve ability to sleep for 2+ consecutive hours without waking due to pain.   Goal status: MET  2.  Pt will increase RUE A/ROM by 25 degrees or greater to improve ability to use RUE when reaching overhead or behind back during dressing and bathing tasks.   Goal status: NOT MET  3.  Pt will  increase RUE strength to 4/5 or greater to improve ability to use RUE when lifting or carrying items during meal preparation/housework/yardwork tasks.   Goal status: MET    ASSESSMENT:  CLINICAL IMPRESSION: Reassessment completed today. Pt reports it is easier to thread his belt and wash his hair now, HEP is going well however does not complete consistently. Pt has met 2/2 STGs and 2/3 LTGs. Pt did not meet ROM goal, however ROM is functional and very close to ROM of LUE. Pt has had multiple surgeries on both the RUE and LUE and is close to functional baseline. Pt reports he is using the RUE during all ADLs with some soreness but no new or sharp pains. Pt is agreeable to discharge today with updated HEP for strengthening.    PERFORMANCE DEFICITS: in functional skills including ADLs, IADLs, ROM, strength, pain, fascial restrictions, and UE functional use   PLAN:  OT FREQUENCY: 1x/week  OT DURATION: 6 weeks  PLANNED INTERVENTIONS: 97168 OT Re-evaluation, 97535 self care/ADL training, 02889 therapeutic exercise, 97530 therapeutic activity, 97140 manual therapy, 97035 ultrasound, 97014 electrical stimulation unattended, patient/family education, and DME and/or AE instructions  CONSULTED AND AGREED WITH PLAN OF CARE: Patient  PLAN FOR NEXT SESSION: N/A-discharge today   Sonny Cory, OTR/L  [phone] 05/03/2023, 12:20 PM

## 2023-05-05 ENCOUNTER — Telehealth: Payer: Self-pay

## 2023-05-05 ENCOUNTER — Ambulatory Visit (HOSPITAL_COMMUNITY): Payer: 59 | Attending: Family Medicine | Admitting: Occupational Therapy

## 2023-05-05 DIAGNOSIS — Z0279 Encounter for issue of other medical certificate: Secondary | ICD-10-CM

## 2023-05-05 NOTE — Telephone Encounter (Signed)
 Patient dropped off document FMLA, to be filled out by provider. Patient requested to send it back via Fax within ASAP. Document is located in providers tray at front office.Please advise at Mobile [phone] (mobile)

## 2023-05-09 ENCOUNTER — Other Ambulatory Visit: Payer: Self-pay

## 2023-05-09 ENCOUNTER — Other Ambulatory Visit (HOSPITAL_COMMUNITY): Payer: Self-pay

## 2023-05-09 ENCOUNTER — Encounter: Payer: Self-pay | Admitting: Pharmacist

## 2023-05-09 MED FILL — Folic Acid Tab 1 MG: ORAL | 30 days supply | Qty: 30 | Fill #2 | Status: CN

## 2023-05-09 NOTE — Telephone Encounter (Signed)
 This form was completed the-please stamp address-otherwise FMLA is ready please review and forward to his wife  [name]

## 2023-05-10 ENCOUNTER — Other Ambulatory Visit (HOSPITAL_COMMUNITY): Payer: Self-pay

## 2023-05-14 ENCOUNTER — Other Ambulatory Visit (HOSPITAL_COMMUNITY): Payer: Self-pay

## 2023-05-14 MED FILL — Folic Acid Tab 1 MG: ORAL | 30 days supply | Qty: 30 | Fill #2 | Status: AC

## 2023-05-23 ENCOUNTER — Telehealth: Payer: Self-pay | Admitting: *Deleted

## 2023-05-23 ENCOUNTER — Other Ambulatory Visit (HOSPITAL_COMMUNITY): Payer: Self-pay

## 2023-05-23 ENCOUNTER — Other Ambulatory Visit: Payer: Self-pay

## 2023-05-23 MED ORDER — FREESTYLE LIBRE 3 PLUS SENSOR MISC
3 refills | Status: DC
  Filled 2023-05-23: qty 6, fill #0
  Filled 2023-08-06: qty 6, 90d supply, fill #0
  Filled 2023-11-02: qty 6, 90d supply, fill #1

## 2023-05-23 NOTE — Telephone Encounter (Signed)
Sure!  I just sent in the script.

## 2023-05-23 NOTE — Telephone Encounter (Signed)
Noted  

## 2023-05-23 NOTE — Telephone Encounter (Signed)
Patient's wife called and she shares that as of February 1, Travis [name] will have the Health Team [employer]age [employer] Plan. She has found out that the preferred CGM is [employer] 3 and the sensors. She is asking if we can go ahead and send in the prescription for the sensors to the [facility] out patient pharmacy at [facility]. Patient will be using his phone as the receiver.

## 2023-05-30 ENCOUNTER — Other Ambulatory Visit: Payer: Self-pay

## 2023-05-30 ENCOUNTER — Other Ambulatory Visit: Payer: Self-pay | Admitting: *Deleted

## 2023-05-30 ENCOUNTER — Other Ambulatory Visit (HOSPITAL_COMMUNITY): Payer: Self-pay

## 2023-05-30 DIAGNOSIS — Z794 Long term (current) use of insulin: Secondary | ICD-10-CM

## 2023-05-30 DIAGNOSIS — Z7984 Long term (current) use of oral hypoglycemic drugs: Secondary | ICD-10-CM

## 2023-05-30 DIAGNOSIS — E1159 Type 2 diabetes mellitus with other circulatory complications: Secondary | ICD-10-CM

## 2023-05-30 MED ORDER — FREESTYLE LIBRE 3 PLUS SENSOR MISC
0 refills | Status: DC
  Filled 2023-05-30 – 2023-06-06 (×2): qty 2, 30d supply, fill #0

## 2023-05-31 ENCOUNTER — Other Ambulatory Visit (HOSPITAL_COMMUNITY): Payer: Self-pay

## 2023-05-31 ENCOUNTER — Other Ambulatory Visit: Payer: Self-pay

## 2023-06-01 ENCOUNTER — Other Ambulatory Visit: Payer: Self-pay

## 2023-06-01 ENCOUNTER — Other Ambulatory Visit (HOSPITAL_COMMUNITY): Payer: Self-pay

## 2023-06-01 ENCOUNTER — Ambulatory Visit (INDEPENDENT_AMBULATORY_CARE_PROVIDER_SITE_OTHER): Payer: Commercial Managed Care - PPO | Admitting: Family Medicine

## 2023-06-01 VITALS — BP 127/79 | HR 74 | Temp 97.5°F | Ht 73.0 in | Wt 183.2 lb

## 2023-06-01 DIAGNOSIS — I10 Essential (primary) hypertension: Secondary | ICD-10-CM | POA: Diagnosis not present

## 2023-06-01 DIAGNOSIS — M1711 Unilateral primary osteoarthritis, right knee: Secondary | ICD-10-CM

## 2023-06-01 DIAGNOSIS — Z79899 Other long term (current) drug therapy: Secondary | ICD-10-CM | POA: Diagnosis not present

## 2023-06-01 DIAGNOSIS — E1169 Type 2 diabetes mellitus with other specified complication: Secondary | ICD-10-CM

## 2023-06-01 DIAGNOSIS — E1159 Type 2 diabetes mellitus with other circulatory complications: Secondary | ICD-10-CM

## 2023-06-01 DIAGNOSIS — F411 Generalized anxiety disorder: Secondary | ICD-10-CM

## 2023-06-01 DIAGNOSIS — E785 Hyperlipidemia, unspecified: Secondary | ICD-10-CM

## 2023-06-01 MED ORDER — VALACYCLOVIR HCL 1 G PO TABS
1000.0000 mg | ORAL_TABLET | Freq: Every day | ORAL | 3 refills | Status: DC
  Filled 2023-06-01: qty 90, 90d supply, fill #0
  Filled 2023-08-07: qty 30, 30d supply, fill #0
  Filled 2023-09-06: qty 30, 30d supply, fill #1
  Filled 2023-10-01: qty 30, 30d supply, fill #2
  Filled 2023-11-22: qty 30, 30d supply, fill #3

## 2023-06-01 MED ORDER — ATORVASTATIN CALCIUM 20 MG PO TABS
20.0000 mg | ORAL_TABLET | Freq: Every day | ORAL | 1 refills | Status: DC
  Filled 2023-06-01 – 2023-08-13 (×2): qty 90, 90d supply, fill #0
  Filled 2023-11-11: qty 90, 90d supply, fill #1

## 2023-06-01 MED ORDER — FLUOXETINE HCL 20 MG PO CAPS
20.0000 mg | ORAL_CAPSULE | Freq: Every day | ORAL | 1 refills | Status: DC
  Filled 2023-06-01: qty 90, 90d supply, fill #0
  Filled 2023-09-06: qty 90, 90d supply, fill #1

## 2023-06-01 NOTE — Progress Notes (Signed)
Subjective:    Patient ID: Travis [name], male    DOB: 07/27/1960, 63 y.o.   MRN: [id number]  Discussed the use of AI scribe software for clinical note transcription with the patient, who gave verbal consent to proceed.  History of Present Illness   The patient presents for a follow-up regarding diabetes management.  The patient manages his diabetes using a continuous glucose monitor, which alerts for low blood sugar at 80 mg/dL and high at 956 mg/dL. His blood sugar levels typically remain around 150 mg/dL, with occasional spikes above 200 mg/dL that normalize. He sees his endocrinologist twice a year for diabetes management.  His diet includes two eggs and wheat toast for breakfast, using Splenda and Stevia as sweeteners. He usually skips a mid-morning snack and eats 'nabs' for lunch. He avoids spicy foods and monitors sodium intake. Dinner often consists of [place] sandwiches and frozen vegetables. He drinks water and diet Sun Drop, avoiding drinks with aspartame.  He is currently on several medications, including Eliquis twice a day, alprazolam in the morning and at bedtime, fluoxetine, valacyclovir, and gabapentin at bedtime. He injects 20 to 25 units of insulin and takes metformin twice a day.  He experiences knee discomfort attributed to osteoarthritis and uses Tylenol for pain management, avoiding NSAIDs due to his blood thinner medication.  He works approximately 25 hours a week and strives to remain active, wanting to maintain his activity level and avoid a sedentary lifestyle.  He recently had an eye exam at [facility] in December, which was reported to be normal.       Hyperlipidemia associated with type 2 diabetes mellitus (HCC) - Plan: Lipid Panel  Essential hypertension - Plan: Basic Metabolic Panel  GAD (generalized anxiety disorder)  Primary osteoarthritis of right knee  DM type 2 causing vascular disease (HCC) - Plan: Hemoglobin A1c  High risk medication use -  Plan: Hepatic Function Panel Patient denies ever utilizing alprazolam Tries not to get over stress Does take his blood pressure medicine regular basis For the most part tries to follow diabetic diet but is not always successful Does have right knee pain with some swelling consistent with osteoarthritis  Review of Systems     Objective:    Physical Exam   CHEST: Lungs clear to auscultation. CARDIOVASCULAR: Heart sounds normal. EXTREMITIES: No swelling in legs. Pulses good in feet. NEUROLOGICAL: Sensation intact in feet.     General-in no acute distress Eyes-no discharge Lungs-respiratory rate normal, CTA CV-no murmurs,RRR Extremities skin warm dry no edema Neuro grossly normal Behavior normal, alert       Assessment & Plan:  Assessment and Plan    Type 2 Diabetes Mellitus Blood glucose levels are slightly elevated, with readings often around 150-190. Patient is using a continuous glucose monitor and is on insulin and metformin. Recent A1C was 7.4, indicating suboptimal control. Discussed dietary modifications to improve glycemic control. -Continue current insulin regimen and metformin twice daily. -Implement dietary modifications:replace high-carb snacks with healthier options like half a peanut butter sandwich on wheat bread or deli [place] with cheese. -Check A1C and other labs about a week before the next endocrinology appointment in April.  Anxiety Patient is on alprazolam and fluoxetine. He has reduced his alprazolam dose to half in the morning and one at bedtime. -Continue current regimen of alprazolam and fluoxetine.  Herpes Simplex Virus Patient is on valacyclovir. -Continue valacyclovir as prescribed.  Neuropathic Pain Patient is on gabapentin, but has reduced his dose to one  at bedtime. -Continue gabapentin at current dose.  Atrial Fibrillation Patient is on Eliquis twice daily. -Continue Eliquis as prescribed.  Osteoarthritis Patient reports some  discomfort in his foot, likely due to osteoarthritis. -Advise over-the-counter Tylenol for pain as needed, avoiding NSAIDs due to concurrent use of blood thinner.  General Health Maintenance -Continue regular eye exams, with the most recent one in December. -Order cholesterol, kidney function, and other labs about a week before the next endocrinology appointment in April. -Schedule a wellness checkup in July.      1. Hyperlipidemia associated with type 2 diabetes mellitus (HCC) (Primary) Very important continue statin healthy diet keep LDL under good control check labs before next visit - Lipid Panel  2. Essential hypertension Continue blood pressure medicine healthy diet regular activity recommended - Basic Metabolic Panel  3. GAD (generalized anxiety disorder) Uses alprazolam is on antidepressant.  Patient at times gets very stressed and anxious but overall he is trying to do well  4. Primary osteoarthritis of right knee Take Tylenol for this if he gets worse orthopedics  5. DM type 2 causing vascular disease (HCC) Follow through with endocrinology check A1c before next visit we will share this with endocrinology The importance of cutting back on carbohydrates discussed.  Other healthy options discussed. - Hemoglobin A1c  6. High risk medication use Labs before next visit - Hepatic Function Panel

## 2023-06-02 ENCOUNTER — Ambulatory Visit: Payer: 59 | Admitting: Family Medicine

## 2023-06-06 ENCOUNTER — Other Ambulatory Visit: Payer: Self-pay

## 2023-06-06 ENCOUNTER — Other Ambulatory Visit (HOSPITAL_COMMUNITY): Payer: Self-pay

## 2023-06-07 ENCOUNTER — Other Ambulatory Visit (HOSPITAL_COMMUNITY): Payer: Self-pay

## 2023-06-09 ENCOUNTER — Encounter: Payer: Self-pay | Admitting: Family Medicine

## 2023-06-09 ENCOUNTER — Other Ambulatory Visit: Payer: Self-pay | Admitting: Family Medicine

## 2023-06-09 ENCOUNTER — Other Ambulatory Visit: Payer: Self-pay

## 2023-06-09 ENCOUNTER — Other Ambulatory Visit (HOSPITAL_COMMUNITY): Payer: Self-pay

## 2023-06-09 MED ORDER — ALPRAZOLAM 0.25 MG PO TABS
ORAL_TABLET | ORAL | 1 refills | Status: DC
  Filled 2023-06-09: qty 75, 30d supply, fill #0
  Filled 2023-07-07: qty 75, 30d supply, fill #1
  Filled ????-??-??: fill #1

## 2023-06-10 ENCOUNTER — Other Ambulatory Visit: Payer: Self-pay

## 2023-06-13 NOTE — Progress Notes (Signed)
Office Visit Note   Patient: Travis [name]           Date of Birth: Oct 24, 1960           MRN: [id number] Visit Date: 06/14/2023              Requested by: Babs Sciara, MD [address] [place],  [place] [address] PCP: Babs Sciara, MD   Assessment & Plan: Visit Diagnoses:  1. History of arthroscopy of right shoulder   2. Impingement syndrome of right shoulder   3. Arthritis of right acromioclavicular joint     Plan: Deward is a 4 months postop from a right shoulder scope.  He has done extremely well.  He has completed physical therapy.  At this point he is released activity as tolerated.  Follow-up as needed.  Follow-Up Instructions: No follow-ups on file.   Orders:  No orders of the defined types were placed in this encounter.  No orders of the defined types were placed in this encounter.     Procedures: No procedures performed   Clinical Data: No additional findings.   Subjective: Chief Complaint  Patient presents with   Right Shoulder - Follow-up    HPI Stoney is 4 months postop from right shoulder scope.    Review of Systems   Objective: Vital Signs: There were no vitals taken for this visit.  Physical Exam  Ortho Exam Examination of the right shoulder shows fully healed surgical scars.  He has excellent functional range of motion.  He has no pain.  Strength is improving nicely. Specialty Comments:  No specialty comments available.  Imaging: No results found.   PMFS History: Patient Active Problem List   Diagnosis Date Noted   History of arthroscopy of right shoulder 06/14/2023   Impingement syndrome of right shoulder 01/25/2023   Nontraumatic incomplete tear of right rotator cuff 01/25/2023   Stenosing tenosynovitis of finger of left hand 12/22/2022   Hypercoagulable state due to paroxysmal atrial fibrillation (HCC) 07/19/2022   Nontraumatic incomplete tear of left rotator cuff 07/07/2022   Arthritis of left  acromioclavicular joint 07/07/2022   Right groin hernia 03/03/2021   Hyperlipidemia associated with type 2 diabetes mellitus (HCC) 12/25/2020   Tendinopathy of right rotator cuff 09/25/2020   Arthritis of right acromioclavicular joint 09/25/2020   Adrenal adenoma, left 09/07/2019   Chronic anticoagulation    GI bleed 06/14/2019   Vitamin D deficiency 04/18/2019   History of stroke 04/20/2018   GAD (generalized anxiety disorder) 05/15/2016   Acute bronchiolitis due to respiratory syncytial virus (RSV)    Acute CVA (cerebrovascular accident) (HCC) 05/04/2016   Dizziness 05/02/2016   Anxiety and depression 05/02/2016   Paroxysmal atrial fibrillation (HCC) 03/02/2016   PFO (patent foramen ovale) 07/24/2015   OSA (obstructive sleep apnea) 04/18/2015   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 artery (HCC) 04/18/2015   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   Snoring 01/23/2015   Degeneration of cervical intervertebral disc 01/23/2015   Essential hypertension 12/14/2014   DM type 2 causing vascular disease (HCC) 12/14/2014   Neck pain 12/14/2014   Cerebral infarction, chronic    Multi-infarct state 10/14/2014   Impingement syndrome of left shoulder 08/02/2014   Adhesive capsulitis of left shoulder 08/02/2014   Rectal bleeding 03/13/2014   Diabetes type 2, controlled (HCC) 02/18/2014   Diabetic peripheral neuropathy (HCC) 03/28/2013   Irreducible incisional hernia 11/11/2010   Past Medical History:  Diagnosis Date   A-fib (  HCC) 02/2016   found on loop recorder   Adhesive capsulitis of left shoulder 08/02/2014   Anxiety    Arthritis    Blood transfusion without reported diagnosis    Complication of anesthesia    bleed after last shoulder-had to stay overnight   Depression    Diabetes mellitus    Diabetic peripheral neuropathy (HCC) 03/28/2013   Diverticulosis    Dizziness    Hernia, inguinal    Hyperlipidemia    Hypertension    Impingement syndrome of left  shoulder 08/02/2014   Ischemic colitis (HCC)    Multi-infarct state 10/14/2014   Neuropathy of lower extremity    Night sweats    every once in a while   Sleep apnea    no CPAP   Stroke (HCC) 02/2015   no deficits   Syncope and collapse     Family History  Problem Relation Age of Onset   Stroke Father    Hyperlipidemia Father    Heart attack Sister 70   Stroke Sister    Dementia Mother    ALS Brother        age 3   Diabetes Maternal Grandfather    Colon cancer Neg Hx    Esophageal cancer Neg Hx    Stomach cancer Neg Hx    Rectal cancer Neg Hx     Past Surgical History:  Procedure Laterality Date   BIOPSY  06/14/2019   Procedure: BIOPSY;  Surgeon: Sherrilyn Rist, MD;  Location: [facility];  Service: Gastroenterology;;   CERVICAL DISC SURGERY     COLON SURGERY  05/03/2001   1/3 removed for diverticulitis   COLONOSCOPY     COLONOSCOPY WITH PROPOFOL N/A 06/14/2019   Procedure: COLONOSCOPY WITH PROPOFOL;  Surgeon: Sherrilyn Rist, MD;  Location: [facility];  Service: Gastroenterology;  Laterality: N/A;   EP IMPLANTABLE DEVICE N/A 05/01/2015   Procedure: Loop Recorder Insertion;  Surgeon: Duke Salvia, MD;  Location: [facility] INVASIVE CV LAB;  Service: Cardiovascular;  Laterality: N/A;   INGUINAL HERNIA REPAIR Bilateral    KNEE ARTHROSCOPY Right    SHOULDER ARTHROSCOPY Left 09/2022   SHOULDER ARTHROSCOPY Left 08/02/2014   Procedure: LEFT SHOULDER SCOPE DEBRIDEMENT/ACROMIOPLASTY;  Surgeon: Teryl Lucy, MD;  Location: [facility];  Service: Orthopedics;  Laterality: Left;  ANESTHESIA: GENERAL, PRE/POST OP SCALENE   TEE WITHOUT CARDIOVERSION N/A 03/03/2015   Procedure: TRANSESOPHAGEAL ECHOCARDIOGRAM (TEE);  Surgeon: Laqueta Linden, MD;  Location: [facility];  Service: Cardiology;  Laterality: N/A;   TRIGGER FINGER RELEASE Left 12/22/2022   Procedure: LEFT A-1 PULLEY RELEASE LONG FINGER;  Surgeon: Tarry Kos, MD;  Location: [facility];  Service: Orthopedics;  Laterality: Left;   UMBILICAL HERNIA REPAIR     with other hernia repair with mesh   WRIST SURGERY Right    fusion   Social History   Occupational History   Occupation: disabled  Tobacco Use   Smoking status: Former    Current packs/day: 0.00    Average packs/day: 1 pack/day for 8.0 years (8.0 ttl pk-yrs)    Types: Cigarettes    Start date: 06/07/1970    Quit date: 05/03/1978    Years since quitting: 45.1   Smokeless tobacco: Former    Types: Chew    Quit date: 05/03/1978   Tobacco comments:    Former smoker 07/19/22  Vaping Use   Vaping status: Never Used  Substance and Sexual Activity   Alcohol use:  Yes    Alcohol/week: 1.0 standard drink of alcohol    Types: 1 Cans of beer per week    Comment: 1 beer every 6 months h/o heavy use in the past 07/19/22   Drug use: No   Sexual activity: Yes

## 2023-06-14 ENCOUNTER — Ambulatory Visit (INDEPENDENT_AMBULATORY_CARE_PROVIDER_SITE_OTHER): Payer: Commercial Managed Care - PPO | Admitting: Orthopaedic Surgery

## 2023-06-14 DIAGNOSIS — Z9889 Other specified postprocedural states: Secondary | ICD-10-CM | POA: Insufficient documentation

## 2023-06-14 DIAGNOSIS — M19011 Primary osteoarthritis, right shoulder: Secondary | ICD-10-CM | POA: Diagnosis not present

## 2023-06-14 DIAGNOSIS — M7541 Impingement syndrome of right shoulder: Secondary | ICD-10-CM | POA: Diagnosis not present

## 2023-06-15 ENCOUNTER — Ambulatory Visit: Payer: Self-pay

## 2023-06-15 ENCOUNTER — Ambulatory Visit (INDEPENDENT_AMBULATORY_CARE_PROVIDER_SITE_OTHER): Payer: HMO

## 2023-06-15 DIAGNOSIS — Z7984 Long term (current) use of oral hypoglycemic drugs: Secondary | ICD-10-CM | POA: Diagnosis not present

## 2023-06-15 DIAGNOSIS — Z794 Long term (current) use of insulin: Secondary | ICD-10-CM

## 2023-06-15 DIAGNOSIS — E1159 Type 2 diabetes mellitus with other circulatory complications: Secondary | ICD-10-CM

## 2023-06-15 NOTE — Progress Notes (Signed)
Pt seen for nurses visit for Libre 3 CGM set up, application and education. Reviewed & set up [facility] 3 reader app on pt's phone. Discussed all questions pt had. Demonstrated & applied Libre 3 Plus sensor to pt's L upper arm without difficulty. All questions answered. Advised pt if he had any other questions to contact the office. Pt voiced understanding.

## 2023-06-18 ENCOUNTER — Other Ambulatory Visit (HOSPITAL_COMMUNITY): Payer: Self-pay

## 2023-06-18 MED FILL — Folic Acid Tab 1 MG: ORAL | 30 days supply | Qty: 30 | Fill #3 | Status: AC

## 2023-06-25 ENCOUNTER — Other Ambulatory Visit (HOSPITAL_COMMUNITY): Payer: Self-pay

## 2023-07-04 ENCOUNTER — Other Ambulatory Visit (HOSPITAL_COMMUNITY): Payer: Self-pay

## 2023-07-04 ENCOUNTER — Other Ambulatory Visit: Payer: Self-pay | Admitting: Nurse Practitioner

## 2023-07-04 ENCOUNTER — Other Ambulatory Visit: Payer: Self-pay

## 2023-07-04 DIAGNOSIS — E1159 Type 2 diabetes mellitus with other circulatory complications: Secondary | ICD-10-CM

## 2023-07-04 DIAGNOSIS — Z794 Long term (current) use of insulin: Secondary | ICD-10-CM

## 2023-07-04 DIAGNOSIS — Z7984 Long term (current) use of oral hypoglycemic drugs: Secondary | ICD-10-CM

## 2023-07-04 MED ORDER — FREESTYLE LIBRE 3 PLUS SENSOR MISC
0 refills | Status: AC
  Filled 2023-07-04: qty 2, 30d supply, fill #0

## 2023-07-06 ENCOUNTER — Other Ambulatory Visit (HOSPITAL_COMMUNITY): Payer: Self-pay

## 2023-07-07 ENCOUNTER — Other Ambulatory Visit: Payer: Self-pay

## 2023-07-15 ENCOUNTER — Other Ambulatory Visit (HOSPITAL_COMMUNITY): Payer: Self-pay

## 2023-07-15 ENCOUNTER — Other Ambulatory Visit: Payer: Self-pay

## 2023-07-15 ENCOUNTER — Encounter (HOSPITAL_COMMUNITY): Payer: Self-pay

## 2023-07-15 ENCOUNTER — Other Ambulatory Visit (HOSPITAL_COMMUNITY): Payer: Self-pay | Admitting: Physician Assistant

## 2023-07-15 MED ORDER — APIXABAN 5 MG PO TABS
5.0000 mg | ORAL_TABLET | Freq: Two times a day (BID) | ORAL | 1 refills | Status: DC
  Filled 2023-07-15 – 2023-08-01 (×2): qty 200, 100d supply, fill #0

## 2023-07-15 MED ORDER — APIXABAN 5 MG PO TABS
5.0000 mg | ORAL_TABLET | Freq: Two times a day (BID) | ORAL | 0 refills | Status: DC
  Filled 2023-07-15: qty 180, 90d supply, fill #0

## 2023-07-15 MED FILL — Folic Acid Tab 1 MG: ORAL | 30 days supply | Qty: 30 | Fill #4 | Status: AC

## 2023-07-20 ENCOUNTER — Other Ambulatory Visit: Payer: Self-pay | Admitting: Nurse Practitioner

## 2023-07-21 ENCOUNTER — Other Ambulatory Visit (HOSPITAL_COMMUNITY): Payer: Self-pay

## 2023-07-21 ENCOUNTER — Other Ambulatory Visit: Payer: Self-pay

## 2023-07-21 MED ORDER — COMFORT EZ PEN NEEDLES 31G X 6 MM MISC
2 refills | Status: DC
  Filled 2023-07-21: qty 100, 100d supply, fill #0
  Filled 2023-10-14 – 2023-10-24 (×2): qty 100, 100d supply, fill #1

## 2023-07-22 ENCOUNTER — Other Ambulatory Visit (HOSPITAL_COMMUNITY): Payer: Self-pay

## 2023-07-26 ENCOUNTER — Other Ambulatory Visit (HOSPITAL_COMMUNITY): Payer: Self-pay

## 2023-08-01 ENCOUNTER — Other Ambulatory Visit (HOSPITAL_COMMUNITY): Payer: Self-pay

## 2023-08-01 ENCOUNTER — Telehealth: Payer: Self-pay | Admitting: *Deleted

## 2023-08-01 NOTE — Telephone Encounter (Signed)
 Patient 's wife left a message that the patient has  [employer], and when he gets his refills for Evaristo Bury, for [place] to send in for a quantity of 100 days verses 90 day. The 100 days cost the patient $117.50. She did ask for a call back. I called her back and she said that the 90 day supply is $105.00 and just for a little more he could get the 100 day supply.

## 2023-08-04 ENCOUNTER — Ambulatory Visit (HOSPITAL_COMMUNITY)
Admission: RE | Admit: 2023-08-04 | Discharge: 2023-08-04 | Disposition: A | Discharge status: Home / Self Care | Source: Ambulatory Visit | Attending: Internal Medicine | Admitting: Internal Medicine

## 2023-08-04 ENCOUNTER — Encounter: Payer: Self-pay | Admitting: Pharmacist

## 2023-08-04 ENCOUNTER — Other Ambulatory Visit: Payer: Self-pay

## 2023-08-04 ENCOUNTER — Other Ambulatory Visit (HOSPITAL_COMMUNITY): Payer: Self-pay

## 2023-08-04 VITALS — BP 140/82 | HR 68 | Ht 73.0 in | Wt 184.0 lb

## 2023-08-04 DIAGNOSIS — I480 Paroxysmal atrial fibrillation: Secondary | ICD-10-CM | POA: Diagnosis not present

## 2023-08-04 DIAGNOSIS — D6869 Other thrombophilia: Secondary | ICD-10-CM | POA: Diagnosis not present

## 2023-08-04 DIAGNOSIS — Z7984 Long term (current) use of oral hypoglycemic drugs: Secondary | ICD-10-CM | POA: Diagnosis not present

## 2023-08-04 DIAGNOSIS — Z7901 Long term (current) use of anticoagulants: Secondary | ICD-10-CM | POA: Insufficient documentation

## 2023-08-04 DIAGNOSIS — Z8673 Personal history of transient ischemic attack (TIA), and cerebral infarction without residual deficits: Secondary | ICD-10-CM | POA: Insufficient documentation

## 2023-08-04 DIAGNOSIS — Z794 Long term (current) use of insulin: Secondary | ICD-10-CM | POA: Insufficient documentation

## 2023-08-04 DIAGNOSIS — I4891 Unspecified atrial fibrillation: Secondary | ICD-10-CM | POA: Diagnosis not present

## 2023-08-04 DIAGNOSIS — I10 Essential (primary) hypertension: Secondary | ICD-10-CM | POA: Diagnosis not present

## 2023-08-04 LAB — CBC
HCT: 38.9 % — ABNORMAL LOW (ref 39.0–52.0)
Hemoglobin: 13.5 g/dL (ref 13.0–17.0)
MCH: 33.9 pg (ref 26.0–34.0)
MCHC: 34.7 g/dL (ref 30.0–36.0)
MCV: 97.7 fL (ref 80.0–100.0)
Platelets: 177 10*3/uL (ref 150–400)
RBC: 3.98 MIL/uL — ABNORMAL LOW (ref 4.22–5.81)
RDW: 12.6 % (ref 11.5–15.5)
WBC: 5.5 10*3/uL (ref 4.0–10.5)
nRBC: 0 % (ref 0.0–0.2)

## 2023-08-04 MED ORDER — APIXABAN 5 MG PO TABS
5.0000 mg | ORAL_TABLET | Freq: Two times a day (BID) | ORAL | 3 refills | Status: AC
  Filled 2023-08-04: qty 200, 100d supply, fill #0
  Filled 2023-11-07: qty 200, 100d supply, fill #1
  Filled 2023-11-11: qty 60, 30d supply, fill #1
  Filled 2023-11-18: qty 200, 100d supply, fill #1
  Filled 2024-01-27 – 2024-02-16 (×2): qty 200, 100d supply, fill #2
  Filled 2024-05-01: qty 120, 60d supply, fill #3

## 2023-08-04 NOTE — Progress Notes (Signed)
 Primary Care Physician: Babs Sciara, MD Referring Physician: Dr. Fernand Parkins is a 63 y.o. male with a h/o CVA, s/p linq with transient afib noted that was seen by me in 2017 for start of eliquis. I have not seen him since then. He is here today to get refills on his eliquis. He has SR on his EKG and has not noted any irregular HR's. Review of his Paceart  device checks  have shown absence of afib. He is doing well. No bleeding issues. Last cmet  In December showed normal creatinine at 1.03.  F/u in afib clinic, 07/12/19. He is here for f/u afib  s/p Linq insertion after stroke 2017. As far as he is aware, he has had no further afib. He was in the hospital in February with a GI bleed. He was off anticoagulation for a few days  but has resumed. ASA was stopped. No reason for the bleed was identified. No further bleeding. CHA2DS2VASc score is at least 4.  His Linq battery has since expired but he prefers to leave in place for now.  F/u in afib clinic, 08/02/20. Linq battery is now dead. We discussed if he wanted to have removed, for now, he will continue to let it be.  He reports that he is not noted any afib. Had cmet 3/11 with PCP, but not a CBC, will draw today with ongoing eliquis use, CHA2DS2VASc of at least 4. No bleed issues.   F/u 2021-08-02. He reports no awareness of afib. He feels good. No complaints. Ekg shows SR.  CHA2DS2VASc  of at least 4. Continues on eliquis 5 mg bid   F/u 07/19/22. He is doing well overall and denies any episodes of Afib over the past year. Continues on eliquis 5 mg bid for CHA2DS2VASc of at least 4. No bleeding concerns.  Follow up in Afib clinic, 08/04/23. He is currently in NSR. He has had no episodes of Afib over the past year. He continues taking Eliquis 5 mg BID and has not had any bleeding issues.   Today, he denies symptoms of palpitations, chest pain, shortness of breath, orthopnea, PND, lower extremity edema, dizziness, presyncope, syncope, or  neurologic sequela. The patient is tolerating medications without difficulties and is otherwise without complaint today.   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   Anxiety    Arthritis    Blood transfusion without reported diagnosis    Complication of anesthesia    bleed after last shoulder-had to stay overnight   Depression    Diabetes mellitus    Diabetic peripheral neuropathy (HCC) 03/28/2013   Diverticulosis    Dizziness    Hernia, inguinal    Hyperlipidemia    Hypertension    Impingement syndrome of left shoulder 08/02/2014   Ischemic colitis (HCC)    Multi-infarct state 10/14/2014   Neuropathy of lower extremity    Night sweats    every once in a while   Sleep apnea    no CPAP   Stroke (HCC) 02/2015   no deficits   Syncope and collapse    Past Surgical History:  Procedure Laterality Date   BIOPSY  06/14/2019   Procedure: BIOPSY;  Surgeon: Sherrilyn Rist, MD;  Location: [facility];  Service: Gastroenterology;;   CERVICAL DISC SURGERY     COLON SURGERY  05/03/2001   1/3  removed for diverticulitis   COLONOSCOPY     COLONOSCOPY WITH PROPOFOL N/A 06/14/2019   Procedure: COLONOSCOPY WITH PROPOFOL;  Surgeon: Sherrilyn Rist, MD;  Location: [facility];  Service: Gastroenterology;  Laterality: N/A;   EP IMPLANTABLE DEVICE N/A 05/01/2015   Procedure: Loop Recorder Insertion;  Surgeon: Duke Salvia, MD;  Location: [facility] INVASIVE CV LAB;  Service: Cardiovascular;  Laterality: N/A;   INGUINAL HERNIA REPAIR Bilateral    KNEE ARTHROSCOPY Right    SHOULDER ARTHROSCOPY Left 09/2022   SHOULDER ARTHROSCOPY Left 08/02/2014   Procedure: LEFT SHOULDER SCOPE DEBRIDEMENT/ACROMIOPLASTY;  Surgeon: Teryl Lucy, MD;  Location: [facility];  Service: Orthopedics;  Laterality: Left;  ANESTHESIA: GENERAL, PRE/POST OP SCALENE   TEE WITHOUT CARDIOVERSION N/A 03/03/2015    Procedure: TRANSESOPHAGEAL ECHOCARDIOGRAM (TEE);  Surgeon: Laqueta Linden, MD;  [facility];  Service: Cardiology;  Laterality: N/A;   TRIGGER FINGER RELEASE Left 12/22/2022   Procedure: LEFT A-1 PULLEY RELEASE LONG FINGER;  Surgeon: Tarry Kos, MD;  Location: [facility];  Service: Orthopedics;  Laterality: Left;   UMBILICAL HERNIA REPAIR     with other hernia repair with mesh   WRIST SURGERY Right    fusion    Current Outpatient Medications  Medication Sig Dispense Refill   ALPRAZolam (XANAX) 0.25 MG tablet Take 0.5 tablets (0.125 mg total) by mouth in the morning AND 1 tablet (0.25 mg total) daily in the afternoon AND 1 tablet (0.25 mg total) every evening. Take as needed 75 tablet 1   atorvastatin (LIPITOR) 20 MG tablet Take 1 tablet (20 mg total) by mouth daily. 90 tablet 1   Blood Glucose Monitoring Suppl (FREESTYLE LITE) w/Device KIT Use to check glucose twice daily 1 kit 0   Blood Pressure Monitoring (OMRON 3 SERIES BP MONITOR) DEVI USE AS DIRECTED. 1 each 0   Cholecalciferol 50 MCG (2000 UT) CAPS Take 1 capsule (2,000 Units total) by mouth daily with breakfast. 30 each 6   Continuous Glucose Sensor (FREESTYLE LIBRE 3 PLUS SENSOR) MISC Change sensor every 15 days. 6 each 3   Continuous Glucose Sensor (FREESTYLE LIBRE 3 PLUS SENSOR) MISC Change sensor every 15 days as directed by the provider to read blood glucose levels. 2 each 0   FLUoxetine (PROZAC) 20 MG capsule Take 1 capsule (20 mg total) by mouth daily. 90 capsule 1   fluticasone (FLONASE) 50 MCG/ACT nasal spray Place 1 spray into both nostrils daily. 16 g 3   folic acid (FOLVITE) 1 MG tablet Take 1 tablet (1 mg total) by mouth daily. 30 tablet 12   gabapentin (NEURONTIN) 300 MG capsule Take 1 capsule (300 mg total) by mouth every morning AND 1 capsule (300 mg total) daily after lunch AND 2 capsules (600 mg total) at bedtime. 120 capsule 11   insulin degludec (TRESIBA FLEXTOUCH) 100 UNIT/ML  FlexTouch Pen Inject 20 Units into the skin at bedtime. 18 mL 3   Insulin Pen Needle (COMFORT EZ PEN NEEDLES) 31G X 6 MM MISC USE TO INJECT INSULIN DAILY AS DIRECTED 100 each 2   Insulin Pen Needle (UNIFINE PENTIPS) 31G X 6 MM MISC USE TO INJECT INSULIN DAILY AS DIRECTED 100 each 1   metFORMIN (GLUCOPHAGE) 500 MG tablet Take 1 tablet (500 mg total) by mouth 2 (two) times daily with a meal. 180 tablet 3   Multiple Vitamin (MULTIVITAMIN WITH MINERALS) TABS tablet Take 1 tablet daily by mouth.     ondansetron (ZOFRAN) 4  MG tablet Take 1 tablet (4 mg total) by mouth every 8 (eight) hours as needed for nausea or vomiting. 40 tablet 0   traMADol (ULTRAM) 50 MG tablet Take 1 tablet (50 mg total) by mouth 3 (three) times daily as needed. 30 tablet 1   valACYclovir (VALTREX) 1000 MG tablet Take 1 tablet (1,000 mg total) by mouth daily. 90 tablet 3   apixaban (ELIQUIS) 5 MG TABS tablet Take 1 tablet (5 mg total) by mouth 2 (two) times daily. 180 tablet 3   No current facility-administered medications for this encounter.    Allergies  Allergen Reactions   Lisinopril Cough    Patient/spouse is not aware/familiar with why this is listed as an allergy Not a true allergy but on this list to avoid ACE inhibitors-SA Luking MD   ROS- All systems are reviewed and negative except as per the HPI above  Physical Exam: Vitals:   08/04/23 1355  BP: (!) 140/82  Pulse: 68  Weight: 83.5 kg  Height: 6\' 1"  (1.854 m)    Wt Readings from Last 3 Encounters:  08/04/23 83.5 kg  06/15/23 84.8 kg  06/01/23 83.1 kg    Labs: Lab Results  Component Value Date   NA 139 03/01/2023   K 3.9 03/01/2023   CL 108 03/01/2023   CO2 28 03/01/2023   GLUCOSE 184 (H) 03/01/2023   BUN 12 03/01/2023   CREATININE 0.99 03/01/2023   CALCIUM 8.7 (L) 03/01/2023   PHOS 3.2 06/13/2019   MG 2.0 06/13/2019   Lab Results  Component Value Date   INR 1.15 05/02/2016   Lab Results  Component Value Date   CHOL 101 11/18/2022    HDL 33 (L) 11/18/2022   LDLCALC 46 11/18/2022   TRIG 119 11/18/2022   GEN- The patient is well appearing, alert and oriented x 3 today.   Neck - no JVD or carotid bruit noted Lungs- Clear to ausculation bilaterally, normal work of breathing Heart- Regular rate and rhythm, no murmurs, rubs or gallops, PMI not laterally displaced Extremities- no clubbing, cyanosis, or edema Skin - no rash or ecchymosis noted   EKG- Vent. rate 68 BPM PR interval 156 ms QRS duration 96 ms QT/QTcB 374/397 ms P-R-T axes 60 5 74 Normal sinus rhythm Normal ECG When compared with ECG of 19-Jul-2022 13:55, PREVIOUS ECG IS PRESENT  ECHO 2018 showed normal LV function.  Epic records reviewed   Assessment and Plan: 1. Paroxysmal afib Seen on Linq after stroke 2017 Now with expired battery, does not want replaced or removed at this point  He is currently in NSR. He is doing well overall.   2. Secondary hypercoagulable state. CHA2DS2VASc score of at least 4  Continue Eliquis 5 mg BID without interruption. CBC drawn today.   3. HTN Stable today.     Follow up 1 year Afib clinic.  Justin Mend, PA-C Afib Clinic [facility] [address] [place], [place] [address] [phone]

## 2023-08-06 ENCOUNTER — Other Ambulatory Visit: Payer: Self-pay | Admitting: Family Medicine

## 2023-08-08 ENCOUNTER — Other Ambulatory Visit: Payer: Self-pay

## 2023-08-08 ENCOUNTER — Other Ambulatory Visit (HOSPITAL_COMMUNITY): Payer: Self-pay

## 2023-08-08 MED ORDER — ALPRAZOLAM 0.25 MG PO TABS
ORAL_TABLET | ORAL | 3 refills | Status: DC
  Filled 2023-08-08: qty 75, 30d supply, fill #0
  Filled 2023-09-06: qty 75, 30d supply, fill #1
  Filled 2023-10-05: qty 75, 30d supply, fill #2
  Filled 2023-11-07: qty 75, 30d supply, fill #3

## 2023-08-09 ENCOUNTER — Other Ambulatory Visit (HOSPITAL_COMMUNITY): Payer: Self-pay

## 2023-08-09 ENCOUNTER — Other Ambulatory Visit: Payer: Self-pay

## 2023-08-11 ENCOUNTER — Other Ambulatory Visit (HOSPITAL_COMMUNITY): Payer: Self-pay

## 2023-08-11 ENCOUNTER — Encounter: Payer: Self-pay | Admitting: Nurse Practitioner

## 2023-08-11 ENCOUNTER — Ambulatory Visit: Payer: 59 | Admitting: Nurse Practitioner

## 2023-08-11 VITALS — BP 108/88 | HR 68 | Ht 73.0 in | Wt 183.0 lb

## 2023-08-11 DIAGNOSIS — E1159 Type 2 diabetes mellitus with other circulatory complications: Secondary | ICD-10-CM

## 2023-08-11 DIAGNOSIS — Z794 Long term (current) use of insulin: Secondary | ICD-10-CM | POA: Diagnosis not present

## 2023-08-11 DIAGNOSIS — Z7984 Long term (current) use of oral hypoglycemic drugs: Secondary | ICD-10-CM | POA: Diagnosis not present

## 2023-08-11 DIAGNOSIS — D3502 Benign neoplasm of left adrenal gland: Secondary | ICD-10-CM

## 2023-08-11 DIAGNOSIS — E559 Vitamin D deficiency, unspecified: Secondary | ICD-10-CM

## 2023-08-11 DIAGNOSIS — E782 Mixed hyperlipidemia: Secondary | ICD-10-CM

## 2023-08-11 MED ORDER — METFORMIN HCL 500 MG PO TABS
500.0000 mg | ORAL_TABLET | Freq: Two times a day (BID) | ORAL | 3 refills | Status: DC
  Filled 2023-08-11 – 2023-10-14 (×2): qty 180, 90d supply, fill #0

## 2023-08-11 MED ORDER — TRESIBA FLEXTOUCH 100 UNIT/ML ~~LOC~~ SOPN
20.0000 [IU] | PEN_INJECTOR | Freq: Every day | SUBCUTANEOUS | 3 refills | Status: DC
  Filled 2023-08-11 – 2023-10-14 (×2): qty 18, 90d supply, fill #0

## 2023-08-11 NOTE — Progress Notes (Signed)
 08/11/2023, 1:58 PM              Endocrinology follow-up note    Subjective:    Patient ID: Travis [name], male    DOB: 1961/04/03.  Travis [name] is being seen in follow-up  for management of currently uncontrolled symptomatic diabetes requested by  Babs Sciara, MD.   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   Anxiety    Arthritis    Blood transfusion without reported diagnosis    Complication of anesthesia    bleed after last shoulder-had to stay overnight   Depression    Diabetes mellitus    Diabetic peripheral neuropathy (HCC) 03/28/2013   Diverticulosis    Dizziness    Hernia, inguinal    Hyperlipidemia    Hypertension    Impingement syndrome of left shoulder 08/02/2014   Ischemic colitis (HCC)    Multi-infarct state 10/14/2014   Neuropathy of lower extremity    Night sweats    every once in a while   Sleep apnea    no CPAP   Stroke (HCC) 02/2015   no deficits   Syncope and collapse    Past Surgical History:  Procedure Laterality Date   BIOPSY  06/14/2019   Procedure: BIOPSY;  Surgeon: Sherrilyn Rist, MD;  Location: [facility];  Service: Gastroenterology;;   CERVICAL DISC SURGERY     COLON SURGERY  05/03/2001   1/3 removed for diverticulitis   COLONOSCOPY     COLONOSCOPY WITH PROPOFOL N/A 06/14/2019   Procedure: COLONOSCOPY WITH PROPOFOL;  Surgeon: Sherrilyn Rist, MD;  Location: [facility];  Service: Gastroenterology;  Laterality: N/A;   EP IMPLANTABLE DEVICE N/A 05/01/2015   Procedure: Loop Recorder Insertion;  Surgeon: Duke Salvia, MD;  Location: [facility] INVASIVE CV LAB;  Service: Cardiovascular;  Laterality: N/A;   INGUINAL HERNIA REPAIR Bilateral    KNEE ARTHROSCOPY Right    SHOULDER ARTHROSCOPY Left 09/2022   SHOULDER ARTHROSCOPY Left 08/02/2014   Procedure: LEFT SHOULDER  SCOPE DEBRIDEMENT/ACROMIOPLASTY;  Surgeon: Teryl Lucy, MD;  Location: [facility];  Service: Orthopedics;  Laterality: Left;  ANESTHESIA: GENERAL, PRE/POST OP SCALENE   TEE WITHOUT CARDIOVERSION N/A 03/03/2015   Procedure: TRANSESOPHAGEAL ECHOCARDIOGRAM (TEE);  Surgeon: Laqueta Linden, MD;  Location: [facility];  Service: Cardiology;  Laterality: N/A;   TRIGGER FINGER RELEASE Left 12/22/2022   Procedure: LEFT A-1 PULLEY RELEASE LONG FINGER;  Surgeon: Tarry Kos, MD;  Location: [facility];  Service: Orthopedics;  Laterality: Left;   UMBILICAL HERNIA REPAIR     with other hernia repair with mesh   WRIST SURGERY Right    fusion   Social History   Socioeconomic History   Marital status: Married    Spouse name: Not on file   Number of children: 2   Years of education: College   Highest education level: Some college, no degree  Occupational History   Occupation: disabled  Tobacco Use   Smoking status: Former    Current packs/day: 0.00    Average packs/day: 1 pack/day for 8.0 years (8.0  ttl pk-yrs)    Types: Cigarettes    Start date: 06/07/1970    Quit date: 05/03/1978    Years since quitting: 45.3   Smokeless tobacco: Former    Types: Chew    Quit date: 05/03/1978   Tobacco comments:    Former smoker 07/19/22  Vaping Use   Vaping status: Never Used  Substance and Sexual Activity   Alcohol use: Yes    Alcohol/week: 1.0 standard drink of alcohol    Types: 1 Cans of beer per week    Comment: 1 beer every 6 months h/o heavy use in the past 07/19/22   Drug use: No   Sexual activity: Yes  Other Topics Concern   Not on file  Social History Narrative   Drinks some coffee, Drink diet sodas and tea   Social Drivers of Corporate investment banker Strain: Low Risk  (10/10/2022)   Overall Financial Resource Strain (CARDIA)    Difficulty of Paying Living Expenses: Not hard at all  Food Insecurity: No Food Insecurity (10/10/2022)   Hunger Vital Sign     Worried About Running Out of Food in the Last Year: Never true    Ran Out of Food in the Last Year: Never true  Transportation Needs: No Transportation Needs (10/10/2022)   PRAPARE - Administrator, Civil Service (Medical): No    Lack of Transportation (Non-Medical): No  Physical Activity: Unknown (10/10/2022)   Exercise Vital Sign    Days of Exercise per Week: 0 days    Minutes of Exercise per Session: Not on file  Stress: No Stress Concern Present (10/10/2022)   [employer] of Occupational Health - Occupational Stress Questionnaire    Feeling of Stress : Only a little  Social Connections: Moderately Integrated (10/10/2022)   Social Connection and Isolation Panel [NHANES]    Frequency of Communication with Friends and Family: More than three times a week    Frequency of Social Gatherings with Friends and Family: Three times a week    Attends Religious Services: 1 to 4 times per year    Active Member of Clubs or Organizations: No    Attends Banker Meetings: Not on file    Marital Status: Married   Outpatient Encounter Medications as of 08/11/2023  Medication Sig   ALPRAZolam (XANAX) 0.25 MG tablet Take 0.5 tablets (0.125 mg total) by mouth in the morning AND 1 tablet (0.25 mg total) daily in the afternoon AND 1 tablet (0.25 mg total) every evening.   apixaban (ELIQUIS) 5 MG TABS tablet Take 1 tablet (5 mg total) by mouth 2 (two) times daily.   atorvastatin (LIPITOR) 20 MG tablet Take 1 tablet (20 mg total) by mouth daily.   Blood Glucose Monitoring Suppl (FREESTYLE LITE) w/Device KIT Use to check glucose twice daily   Blood Pressure Monitoring (OMRON 3 SERIES BP MONITOR) DEVI USE AS DIRECTED.   Cholecalciferol 50 MCG (2000 UT) CAPS Take 1 capsule (2,000 Units total) by mouth daily with breakfast.   Continuous Glucose Sensor (FREESTYLE LIBRE 3 PLUS SENSOR) MISC Change sensor every 15 days.   Continuous Glucose Sensor (FREESTYLE LIBRE 3 PLUS SENSOR) MISC Change  sensor every 15 days as directed by the provider to read blood glucose levels.   FLUoxetine (PROZAC) 20 MG capsule Take 1 capsule (20 mg total) by mouth daily.   fluticasone (FLONASE) 50 MCG/ACT nasal spray Place 1 spray into both nostrils daily.   folic acid (FOLVITE) 1 MG tablet Take  1 tablet (1 mg total) by mouth daily.   gabapentin (NEURONTIN) 300 MG capsule Take 1 capsule (300 mg total) by mouth every morning AND 1 capsule (300 mg total) daily after lunch AND 2 capsules (600 mg total) at bedtime.   Insulin Pen Needle (COMFORT EZ PEN NEEDLES) 31G X 6 MM MISC USE TO INJECT INSULIN DAILY AS DIRECTED   Insulin Pen Needle (UNIFINE PENTIPS) 31G X 6 MM MISC USE TO INJECT INSULIN DAILY AS DIRECTED   Multiple Vitamin (MULTIVITAMIN WITH MINERALS) TABS tablet Take 1 tablet daily by mouth.   ondansetron (ZOFRAN) 4 MG tablet Take 1 tablet (4 mg total) by mouth every 8 (eight) hours as needed for nausea or vomiting.   traMADol (ULTRAM) 50 MG tablet Take 1 tablet (50 mg total) by mouth 3 (three) times daily as needed.   valACYclovir (VALTREX) 1000 MG tablet Take 1 tablet (1,000 mg total) by mouth daily.   [DISCONTINUED] insulin degludec (TRESIBA FLEXTOUCH) 100 UNIT/ML FlexTouch Pen Inject 20 Units into the skin at bedtime.   [DISCONTINUED] metFORMIN (GLUCOPHAGE) 500 MG tablet Take 1 tablet (500 mg total) by mouth 2 (two) times daily with a meal.   insulin degludec (TRESIBA FLEXTOUCH) 100 UNIT/ML FlexTouch Pen Inject 20 Units into the skin at bedtime.   metFORMIN (GLUCOPHAGE) 500 MG tablet Take 1 tablet (500 mg total) by mouth 2 (two) times daily with a meal.   [DISCONTINUED] Insulin Glargine (BASAGLAR KWIKPEN) 100 UNIT/ML INJECT 50 UNITS INTO THE SKIN AT BEDTIME   No facility-administered encounter medications on file as of 08/11/2023.    ALLERGIES: Allergies  Allergen Reactions   Lisinopril Cough    Patient/spouse is not aware/familiar with why this is listed as an allergy Not a true allergy but on  this list to avoid ACE inhibitors-SA Luking MD    VACCINATION STATUS: Immunization History  Administered Date(s) Administered   Influenza Split 03/28/2013   Influenza, Seasonal, Injecte, Preservative Fre 02/22/2023   Influenza,inj,Quad PF,6+ Mos 02/18/2014, 01/23/2015, 02/06/2016, 02/23/2017, 02/24/2018, 01/31/2020, 02/18/2021   Influenza-Unspecified 01/02/2011, 02/09/2019, 03/17/2022   Moderna Sars-Covid-2 Vaccination 07/21/2019, 08/22/2019, 05/01/2020   PNEUMOCOCCAL CONJUGATE-20 10/01/2021   Pfizer(Comirnaty)Fall Seasonal Vaccine 12 years and older 05/10/2022   Pneumococcal Polysaccharide-23 01/31/2010, 03/01/2015   Respiratory Syncytial Virus Vaccine,Recomb Aduvanted(Arexvy) 05/18/2022   Td (Adult),5 Lf Tetanus Toxid, Preservative Free 02/21/2003   Tdap 02/21/2003, 02/18/2014   Zoster Recombinant(Shingrix) 04/19/2019, 06/29/2019    Diabetes He presents for his follow-up diabetic visit. He has type 2 diabetes mellitus. Onset time: He was diagnosed at approximate age of 1 years. His disease course has been worsening (He has prior history of heavy alcohol use/abuse.). There are no hypoglycemic associated symptoms. Pertinent negatives for hypoglycemia include no confusion, headaches, pallor or seizures. Associated symptoms include foot paresthesias. Pertinent negatives for diabetes include no chest pain, no fatigue, no polydipsia, no polyphagia, no polyuria and no weakness. There are no hypoglycemic complications. Symptoms are stable. Diabetic complications include a CVA, nephropathy and peripheral neuropathy. Risk factors for coronary artery disease include dyslipidemia, diabetes mellitus, hypertension, family history, male sex, tobacco exposure and sedentary lifestyle. Current diabetic treatment includes insulin injections and oral agent (monotherapy). He is compliant with treatment most of the time. His weight is fluctuating minimally. He is following a generally unhealthy diet. When asked  about meal planning, he reported none. He has had a previous visit with a dietitian. He participates in exercise intermittently. His home blood glucose trend is increasing steadily. His overall blood glucose range is >200  mg/dl. (He presents today with his CGM showing above target glycemic profile overall.  His POCT A1c today is 8.5%, increasing from last visit of 7.4%.  Analysis of his CGM shows TIR 21%, TAR 79%, TBR 0% with a GMI of 8.8%.  He admits he has been eating more lately.) An ACE inhibitor/angiotensin II receptor blocker is not being taken. He does not see a podiatrist.Eye exam is current.  Hyperlipidemia This is a chronic problem. The current episode started more than 1 year ago. The problem is controlled. Recent lipid tests were reviewed and are normal. Exacerbating diseases include chronic renal disease and diabetes. Factors aggravating his hyperlipidemia include smoking. Pertinent negatives include no chest pain, myalgias or shortness of breath. Current antihyperlipidemic treatment includes statins. The current treatment provides moderate improvement of lipids. There are no compliance problems.  Risk factors for coronary artery disease include dyslipidemia, diabetes mellitus, hypertension, male sex, family history and a sedentary lifestyle.  Hypertension This is a chronic problem. The current episode started more than 1 year ago. The problem has been resolved since onset. The problem is controlled. Pertinent negatives include no chest pain, headaches, neck pain, palpitations or shortness of breath. There are no associated agents to hypertension. Risk factors for coronary artery disease include dyslipidemia, diabetes mellitus, family history, male gender, sedentary lifestyle and smoking/tobacco exposure. Past treatments include nothing. Compliance problems include diet.  Hypertensive end-organ damage includes kidney disease, CAD/MI and CVA. Identifiable causes of hypertension include chronic renal  disease and sleep apnea.    Review of systems  Constitutional: + Minimally fluctuating body weight,  current Body mass index is 24.14 kg/m. , no fatigue, no subjective hyperthermia, no subjective hypothermia Eyes: no blurry vision, no xerophthalmia ENT: no sore throat, no nodules palpated in throat, no dysphagia/odynophagia, no hoarseness Cardiovascular: no chest pain, no shortness of breath, no palpitations, no leg swelling Respiratory: no cough, no shortness of breath Gastrointestinal: no nausea/vomiting/diarrhea Musculoskeletal: + bilateral shoulder pain (chronic) Skin: no rashes, no hyperemia Neurological: no tremors, no numbness, no tingling, no dizziness Psychiatric: no depression, no anxiety  Objective:    BP 108/88 (BP Location: Right Arm, Patient Position: Sitting, Cuff Size: Large)   Pulse 68   Ht 6\' 1"  (1.854 m)   Wt 183 lb (83 kg)   BMI 24.14 kg/m   Wt Readings from Last 3 Encounters:  08/11/23 183 lb (83 kg)  08/04/23 184 lb (83.5 kg)  06/15/23 187 lb (84.8 kg)    BP Readings from Last 3 Encounters:  08/11/23 108/88  08/04/23 (!) 140/82  06/15/23 136/82     Physical Exam- Limited  Constitutional:  Body mass index is 24.14 kg/m. , not in acute distress, + inattentive, hyperactive state of mind with rapid/pressured speech Eyes:  EOMI, no exophthalmos Musculoskeletal: no gross deformities, strength intact in all four extremities, no gross restriction of joint movements Skin:  no rashes, no hyperemia Neurological: no tremor with outstretched hands   Diabetic Foot Exam - Simple   No data filed      CMP     Component Value Date/Time   NA 139 03/01/2023 1130   NA 142 11/18/2022 1059   K 3.9 03/01/2023 1130   CL 108 03/01/2023 1130   CO2 28 03/01/2023 1130   GLUCOSE 184 (H) 03/01/2023 1130   BUN 12 03/01/2023 1130   BUN 14 11/18/2022 1059   CREATININE 0.99 03/01/2023 1130   CREATININE 0.82 02/16/2014 1053   CALCIUM 8.7 (L) 03/01/2023 1130   PROT  6.8 11/18/2022 1059   ALBUMIN 4.4 11/18/2022 1059   AST 18 11/18/2022 1059   ALT 6 11/18/2022 1059   ALKPHOS 88 11/18/2022 1059   BILITOT 0.4 11/18/2022 1059   GFRNONAA >60 03/01/2023 1130   GFRAA 92 05/30/2020 1156     Diabetic Labs (most recent): Lab Results  Component Value Date   HGBA1C 7.4 (A) 04/12/2023   HGBA1C 7.7 (H) 11/18/2022   HGBA1C 7.3 (A) 08/04/2022   MICROALBUR 5.7 12/07/2019   MICROALBUR 0.8 02/16/2014     Lipid Panel ( most recent) Lipid Panel     Component Value Date/Time   CHOL 101 11/18/2022 1059   TRIG 119 11/18/2022 1059   HDL 33 (L) 11/18/2022 1059   CHOLHDL 3.1 11/18/2022 1059   CHOLHDL 3.2 05/03/2016 0545   VLDL 16 05/03/2016 0545   LDLCALC 46 11/18/2022 1059      Lab Results  Component Value Date   TSH 1.390 07/24/2021   TSH 2.120 12/07/2019   TSH 2.12 12/07/2019   TSH 1.960 03/12/2017   TSH 1.460 10/07/2016   TSH 0.720 05/02/2016   TSH 1.895 10/14/2014   FREET4 1.25 07/24/2021   FREET4 1.19 12/07/2019   FREET4 1.21 10/07/2016      Assessment & Plan:   1) DM type 2 causing vascular disease (HCC)  - Travis [name] has currently uncontrolled symptomatic type 2 DM since  63 years of age.  He presents today with his CGM showing above target glycemic profile overall.  His POCT A1c today is 8.5%, increasing from last visit of 7.4%.  Analysis of his CGM shows TIR 21%, TAR 79%, TBR 0% with a GMI of 8.8%.  He admits he has been eating more lately.  -his diabetes is complicated by CVA, history of smoking, history of heavy alcohol use/abuse in the past and Travis [name] remains at a high risk for more acute and chronic complications which include CAD, CVA, CKD, retinopathy, and neuropathy. These are all discussed in detail with the patient.  - Nutritional counseling repeated at each appointment due to patients tendency to fall back in to old habits.  - The patient admits there is a room for improvement in their diet and drink  choices. -  Suggestion is made for the patient to avoid simple carbohydrates from their diet including Cakes, Sweet Desserts / Pastries, Ice Cream, Soda (diet and regular), Sweet Tea, Candies, Chips, Cookies, Sweet Pastries, Store Bought Juices, Alcohol in Excess of 1-2 drinks a day, Artificial Sweeteners, Coffee Creamer, and "Sugar-free" Products. This will help patient to have stable blood glucose profile and potentially avoid unintended weight gain.   - I encouraged the patient to switch to unprocessed or minimally processed complex starch and increased protein intake (animal or plant source), fruits, and vegetables.   - Patient is advised to stick to a routine mealtimes to eat 3 meals a day and avoid unnecessary snacks (to snack only to correct hypoglycemia).  - I have approached him with the following individualized plan to manage diabetes and patient agrees:   - There could be a component of pancreatic endocrine and exocrine insufficiency given his prior history of heavy alcohol use.  Therefore, he may need multiple daily injections of insulin to control diabetes in the future.  -#1 priority in this patient is to avoid hypoglycemia.    -He is advised to continue Tresiba 20 units SQ nightly and continue Metformin 500 mg po twice daily with meals.  Will give him a  chance to change his dietary habits before escalating medication regimen.  -He is advised to monitor blood glucose twice daily (now using his CGM), before breakfast and before bed, and call the clinic if he has readings less than 70 or greater than 200 for 3 tests in a row.  He is benefiting from CGM device, is advised to continue.  -Patient is not a candidate for SGLT2 inhibitors, nor incretin therapy due to his body habitus.  - Patient specific target  A1c;  LDL, HDL, Triglycerides, and  Waist Circumference were discussed in detail.  2) BP/HTN:   His blood pressure is controlled to target.  He is not on any antihypertensive  medications at this time.  He will be considered for low dose ARB on subsequent visits if BP becomes elevated over 140/90.  He does monitor BP at home and reports normal readings.  3) Lipids/HPL:  His most recent lipid panel from 11/18/22 shows controlled LDL at 46.  He is advised to continue Lipitor 20 mg po daily at bedtime.  Side effects and precautions discussed with him.    4) Left adrenal adenoma- During a past hospitalization for GI bleed from ischemic colitis a CT scan showed incidental finding of 2.1 cm left adrenal adenoma.  Subsequent plasma metanephrines were within normal limits, considered nonfunctional.  He will not need intervention for it at this time. His repeat 24-hr urine metanephrines, catecholamines, cortisol, and aldosterone were all still normal favoring benignity.    5)  Weight/Diet: His Body mass index is 24.14 kg/m.  He is not a weight loss candidate.  CDE Consult  is in progress , exercise, and detailed carbohydrates information provided.  If he experiences unintentional weight loss, he will be considered for Creon therapy.   6) Chronic Care/Health Maintenance: -he is on Statin medications and  is encouraged to continue to follow up with Ophthalmology, Dentist,  Podiatrist at least yearly or according to recommendations, and advised to  stay away from smoking. I have recommended yearly flu vaccine and pneumonia vaccination at least every 5 years; moderate intensity exercise for up to 150 minutes weekly; and  sleep for at least 7 hours a day.  - I advised patient to maintain close follow up with Babs Sciara, MD for primary care needs.     I spent  37  minutes in the care of the patient today including review of labs from CMP, Lipids, Thyroid Function, Hematology (current and previous including abstractions from other facilities); face-to-face time discussing  his blood glucose readings/logs, discussing hypoglycemia and hyperglycemia episodes and symptoms, medications  doses, his options of short and long term treatment based on the latest standards of care / guidelines;  discussion about incorporating lifestyle medicine;  and documenting the encounter. Risk reduction counseling performed per USPSTF guidelines to reduce obesity and cardiovascular risk factors.     Please refer to Patient Instructions for Blood Glucose Monitoring and Insulin/Medications Dosing Guide"  in media tab for additional information. Please  also refer to " Patient Self Inventory" in the Media  tab for reviewed elements of pertinent patient history.  Travis [name] participated in the discussions, expressed understanding, and voiced agreement with the above plans.  All questions were answered to his satisfaction. he is encouraged to contact clinic should he have any questions or concerns prior to his return visit.    Follow up plan: - Return in about 3 months (around 11/10/2023) for Diabetes F/U with A1c in office, No previsit labs,  Bring meter and logs.    Ronny Bacon, [facility] [address] [place], [place] [address] Phone: [phone] Fax: [phone]  08/11/2023, 1:58 PM

## 2023-08-13 ENCOUNTER — Other Ambulatory Visit (HOSPITAL_BASED_OUTPATIENT_CLINIC_OR_DEPARTMENT_OTHER): Payer: Self-pay

## 2023-08-13 ENCOUNTER — Other Ambulatory Visit (HOSPITAL_COMMUNITY): Payer: Self-pay

## 2023-08-13 MED FILL — Folic Acid Tab 1 MG: ORAL | 90 days supply | Qty: 90 | Fill #5 | Status: AC

## 2023-08-16 ENCOUNTER — Other Ambulatory Visit (HOSPITAL_COMMUNITY): Payer: Self-pay

## 2023-09-06 ENCOUNTER — Other Ambulatory Visit (HOSPITAL_COMMUNITY): Payer: Self-pay

## 2023-09-08 DIAGNOSIS — Z1283 Encounter for screening for malignant neoplasm of skin: Secondary | ICD-10-CM | POA: Diagnosis not present

## 2023-09-08 DIAGNOSIS — D225 Melanocytic nevi of trunk: Secondary | ICD-10-CM | POA: Diagnosis not present

## 2023-09-08 DIAGNOSIS — L821 Other seborrheic keratosis: Secondary | ICD-10-CM | POA: Diagnosis not present

## 2023-09-08 DIAGNOSIS — B078 Other viral warts: Secondary | ICD-10-CM | POA: Diagnosis not present

## 2023-09-30 ENCOUNTER — Ambulatory Visit: Admitting: Family Medicine

## 2023-09-30 VITALS — BP 114/70 | HR 81 | Temp 98.8°F | Ht 73.0 in | Wt 184.4 lb

## 2023-09-30 DIAGNOSIS — Z8719 Personal history of other diseases of the digestive system: Secondary | ICD-10-CM | POA: Diagnosis not present

## 2023-09-30 DIAGNOSIS — I10 Essential (primary) hypertension: Secondary | ICD-10-CM | POA: Diagnosis not present

## 2023-09-30 DIAGNOSIS — E785 Hyperlipidemia, unspecified: Secondary | ICD-10-CM

## 2023-09-30 DIAGNOSIS — E1169 Type 2 diabetes mellitus with other specified complication: Secondary | ICD-10-CM

## 2023-09-30 DIAGNOSIS — Z125 Encounter for screening for malignant neoplasm of prostate: Secondary | ICD-10-CM | POA: Diagnosis not present

## 2023-09-30 DIAGNOSIS — R1084 Generalized abdominal pain: Secondary | ICD-10-CM | POA: Diagnosis not present

## 2023-09-30 DIAGNOSIS — E1159 Type 2 diabetes mellitus with other circulatory complications: Secondary | ICD-10-CM

## 2023-09-30 NOTE — Progress Notes (Signed)
   Subjective:    Patient ID: Travis [name], male    DOB: Apr 29, 1961, 63 y.o.   MRN: [id number] I think it is coming from some pain though it is not make sure that it has not been disease no HDL sure because of the size HPI Pt comes in today for stomach pain that lasted 3 days of a constant dull ache in the stomach that felt like it wrapped around to the back. Pt states the pain is gone now but thinks he may need a colonoscopy. Medication and allergies.  Discussed the use of AI scribe software for clinical note transcription with the patient, who gave verbal consent to proceed.  History of Present Illness   Travis [name] is a 63 year old male with colitis who presents with intermittent abdominal pain.  He experiences intermittent abdominal pain, described as soreness and aching on the right side, occurring several times a week and sometimes waking him at night. He associates a recent increase in pain with consuming a large quantity of mixed nuts, including Sudan nuts, over a three-day period last week.  He has normal bowel movements two to three times a day, which are formed and not watery, with no mucus or blood present. Certain foods, like pizza, exacerbate his symptoms, although he avoids them. He recalls a previous colonoscopy four years ago but does not remember specific details from the results.  His blood sugar levels fluctuate, particularly noting that he tends to eat too much in the afternoons. He recalls a previous A1c test conducted in December, with a follow-up scheduled for July.       Review of Systems     Objective:   Physical Exam General-in no acute distress Eyes-no discharge Lungs-respiratory rate normal, CTA CV-no murmurs,RRR Extremities skin warm dry no edema Neuro grossly normal Behavior normal, alert Abdomen is soft no guarding rebound or tenderness       Assessment & Plan:  1. Hyperlipidemia associated with type 2 diabetes mellitus (HCC)  (Primary) Continue medication check lab work await results - C-reactive protein - Lipid Panel  2. Essential hypertension Blood pressure under good control continue current measures - C-reactive protein  3. DM type 2 causing vascular disease (HCC) History diabetes continue current measures check A1c's share this result with endocrinology - Hemoglobin A1c - CBC with Differential - C-reactive protein - Hepatic Function Panel - Lipase  4. Generalized abdominal pain Patient with significant abdominal pains that hit him on a frequent basis it is reasonable to do some additional testing and lab work to help rule out the possibility of colitis versus infection we will also go ahead with referral to his gastroenterologist he had a colonoscopy 4 years ago but the colonoscopy was abnormal show colitis it would be reasonable to consider a follow-up colonoscopy to see if he still has colitis going on and to rule out possibility of ulcerative colitis - C-reactive protein - Hepatic Function Panel - Lipase - Tissue Transglutaminase Abs,IgG,IgA - Ambulatory referral to Gastroenterology  5. History of colitis Lab work ordered.  Await the results - C-reactive protein - Hepatic Function Panel - Lipase  6. Screening for prostate cancer Lab work ordered await results - PSA

## 2023-10-01 ENCOUNTER — Other Ambulatory Visit: Payer: Self-pay

## 2023-10-01 ENCOUNTER — Other Ambulatory Visit (HOSPITAL_COMMUNITY): Payer: Self-pay

## 2023-10-04 ENCOUNTER — Ambulatory Visit: Payer: Self-pay | Admitting: Family Medicine

## 2023-10-04 LAB — LIPID PANEL
Chol/HDL Ratio: 3.3 ratio (ref 0.0–5.0)
Cholesterol, Total: 99 mg/dL — ABNORMAL LOW (ref 100–199)
HDL: 30 mg/dL — ABNORMAL LOW (ref >39)
LDL Chol Calc (NIH): 50 mg/dL (ref 0–99)
Triglycerides: 98 mg/dL (ref 0–149)
VLDL Cholesterol Cal: 19 mg/dL (ref 5–40)

## 2023-10-04 LAB — CBC WITH DIFFERENTIAL/PLATELET
Basophils Absolute: 0 10*3/uL (ref 0.0–0.2)
Basos: 1 %
EOS (ABSOLUTE): 0.4 10*3/uL (ref 0.0–0.4)
Eos: 7 %
Hematocrit: 38.5 % (ref 37.5–51.0)
Hemoglobin: 13.2 g/dL (ref 13.0–17.7)
Immature Grans (Abs): 0 10*3/uL (ref 0.0–0.1)
Immature Granulocytes: 0 %
Lymphocytes Absolute: 1.5 10*3/uL (ref 0.7–3.1)
Lymphs: 26 %
MCH: 33.8 pg — ABNORMAL HIGH (ref 26.6–33.0)
MCHC: 34.3 g/dL (ref 31.5–35.7)
MCV: 99 fL — ABNORMAL HIGH (ref 79–97)
Monocytes Absolute: 0.6 10*3/uL (ref 0.1–0.9)
Monocytes: 11 %
Neutrophils Absolute: 3.1 10*3/uL (ref 1.4–7.0)
Neutrophils: 55 %
Platelets: 179 10*3/uL (ref 150–450)
RBC: 3.91 x10E6/uL — ABNORMAL LOW (ref 4.14–5.80)
RDW: 12.5 % (ref 11.6–15.4)
WBC: 5.7 10*3/uL (ref 3.4–10.8)

## 2023-10-04 LAB — C-REACTIVE PROTEIN: CRP: <1 mg/L (ref 0–10)

## 2023-10-04 LAB — HEMOGLOBIN A1C
Est. average glucose Bld gHb Est-mCnc: 189 mg/dL
Hgb A1c MFr Bld: 8.2 % — ABNORMAL HIGH (ref 4.8–5.6)

## 2023-10-04 LAB — HEPATIC FUNCTION PANEL
ALT: 7 IU/L (ref 0–44)
AST: 19 IU/L (ref 0–40)
Albumin: 4.2 g/dL (ref 3.9–4.9)
Alkaline Phosphatase: 81 IU/L (ref 44–121)
Bilirubin Total: 0.6 mg/dL (ref 0.0–1.2)
Bilirubin, Direct: 0.21 mg/dL (ref 0.00–0.40)
Total Protein: 6.6 g/dL (ref 6.0–8.5)

## 2023-10-04 LAB — TISSUE TRANSGLUTAMINASE ABS,IGG,IGA: Tissue Transglut Ab: 3 U/mL (ref 0–5)

## 2023-10-04 LAB — PSA: Prostate Specific Ag, Serum: 1.2 ng/mL (ref 0.0–4.0)

## 2023-10-04 LAB — LIPASE: Lipase: 40 U/L (ref 13–78)

## 2023-10-05 ENCOUNTER — Other Ambulatory Visit: Payer: Self-pay

## 2023-10-14 ENCOUNTER — Other Ambulatory Visit: Payer: Self-pay

## 2023-10-14 ENCOUNTER — Other Ambulatory Visit (HOSPITAL_COMMUNITY): Payer: Self-pay

## 2023-10-17 ENCOUNTER — Other Ambulatory Visit: Payer: Self-pay

## 2023-10-24 ENCOUNTER — Encounter: Payer: Self-pay | Admitting: Gastroenterology

## 2023-10-24 ENCOUNTER — Ambulatory Visit: Admitting: Gastroenterology

## 2023-10-24 VITALS — BP 118/74 | HR 78 | Ht 72.0 in | Wt 175.1 lb

## 2023-10-24 DIAGNOSIS — I639 Cerebral infarction, unspecified: Secondary | ICD-10-CM | POA: Diagnosis not present

## 2023-10-24 DIAGNOSIS — I480 Paroxysmal atrial fibrillation: Secondary | ICD-10-CM

## 2023-10-24 DIAGNOSIS — K5900 Constipation, unspecified: Secondary | ICD-10-CM | POA: Diagnosis not present

## 2023-10-24 DIAGNOSIS — I4891 Unspecified atrial fibrillation: Secondary | ICD-10-CM | POA: Diagnosis not present

## 2023-10-24 DIAGNOSIS — R1011 Right upper quadrant pain: Secondary | ICD-10-CM | POA: Diagnosis not present

## 2023-10-24 DIAGNOSIS — E1121 Type 2 diabetes mellitus with diabetic nephropathy: Secondary | ICD-10-CM

## 2023-10-24 DIAGNOSIS — Z7901 Long term (current) use of anticoagulants: Secondary | ICD-10-CM | POA: Diagnosis not present

## 2023-10-24 DIAGNOSIS — I6340 Cerebral infarction due to embolism of unspecified cerebral artery: Secondary | ICD-10-CM

## 2023-10-24 NOTE — Progress Notes (Signed)
 Noted

## 2023-10-24 NOTE — Patient Instructions (Addendum)
 You have been scheduled for a HIDA scan at [facility] (1st floor) on 11/08/23. Please arrive 30 minutes prior to your scheduled appointment at  9 am. Make certain not to have anything to eat or drink at least 6 hours prior to your test. Should this appointment date or time not work well for you, please call radiology scheduling at [phone].  _____________________________________________________________________ hepatobiliary (HIDA) scan is an imaging procedure used to diagnose problems in the liver, gallbladder and bile ducts. In the HIDA scan, a radioactive chemical or tracer is injected into a vein in your arm. The tracer is handled by the liver like bile. Bile is a fluid produced and excreted by your liver that helps your digestive system break down fats in the foods you eat. Bile is stored in your gallbladder and the gallbladder releases the bile when you eat a meal. A special nuclear medicine scanner (gamma camera) tracks the flow of the tracer from your liver into your gallbladder and small intestine.  During your HIDA scan  You'll be asked to change into a hospital gown before your HIDA scan begins. Your health care team will position you on a table, usually on your back. The radioactive tracer is then injected into a vein in your arm.The tracer travels through your bloodstream to your liver, where it's taken up by the bile-producing cells. The radioactive tracer travels with the bile from your liver into your gallbladder and through your bile ducts to your small intestine.You may feel some pressure while the radioactive tracer is injected into your vein. As you lie on the table, a special gamma camera is positioned over your abdomen taking pictures of the tracer as it moves through your body. The gamma camera takes pictures continually for about an hour. You'll need to keep still during the HIDA scan. This can become uncomfortable, but you may find that you can lessen the discomfort by taking  deep breaths and thinking about other things. Tell your health care team if you're uncomfortable. The radiologist will watch on a computer the progress of the radioactive tracer through your body. The HIDA scan may be stopped when the radioactive tracer is seen in the gallbladder and enters your small intestine. This typically takes about an hour. In some cases extra imaging will be performed if original images aren't satisfactory, if morphine  is given to help visualize the gallbladder or if the medication CCK is given to look at the contraction of the gallbladder. This test typically takes 2 hours to complete. ________________________________________________________________________  _______________________________________________________  If your blood pressure at your visit was 140/90 or greater, please contact your primary care physician to follow up on this.  _______________________________________________________  If you are age 76 or older, your body mass index should be between 23-30. Your Body mass index is 23.75 kg/m. If this is out of the aforementioned range listed, please consider follow up with your Primary Care Provider.  If you are age 69 or younger, your body mass index should be between 19-25. Your Body mass index is 23.75 kg/m. If this is out of the aformentioned range listed, please consider follow up with your Primary Care Provider.   ________________________________________________________  The [facility] providers would like to encourage you to use MYCHART to communicate with providers for non-urgent requests or questions.  Due to long hold times on the telephone, sending your provider a message by [facility] may be a faster and more efficient way to get a response.  Please allow 48  business hours for a response.  Please remember that this is for non-urgent requests.  _______________________________________________________  Due to recent changes in healthcare laws, you may see  the results of your imaging and laboratory studies on MyChart before your provider has had a chance to review them.  We understand that in some cases there may be results that are confusing or concerning to you. Not all laboratory results come back in the same time frame and the provider may be waiting for multiple results in order to interpret others.  Please give us  48 hours in order for your provider to thoroughly review all the results before contacting the office for clarification of your results.   Thank you for entrusting me with your care and choosing [facility].  Bayley CHRISTELLA Blower, PA-C

## 2023-10-24 NOTE — Progress Notes (Signed)
 Chief Complaint: RUQ pain and constipation Primary GI MD: Dr. Abran  HPI: Discussed the use of AI scribe software for clinical note transcription with the patient, who gave verbal consent to proceed.  History of Present Illness Travis [name] is a 63 year old male who presents with right upper quadrant pain and constipation.  He experienced right upper quadrant pain for approximately two to three weeks, which began after consuming a large quantity of mixed nuts, including Sudan nuts. The pain was initially persistent but has since resolved, and he reports no current pain.  Around the same time as the onset of pain, he experienced constipation, which has improved and is no longer an issue. Both the pain and constipation resolved within two to three days.  He has a history of diabetes and is mindful of his diet, choosing healthier options like grilled chicken breast instead of steak. He finds certain foods, such as onions and green peppers, bothersome.  He underwent a colonoscopy within the past five years  No current pain or constipation.   PREVIOUS GI WORKUP    Colonoscopy 06/2019 inpatient with Dr. Legrand for hematochezia and abdominal pain - Diffuse severe inflammation was found from cecum to transverse colon secondary to ischemic colitis. Biopsied.  - Patent end- to- side colo- colonic anastomosis, characterized by erythema.  CT abdomen pelvis without contrast 05/2021 IMPRESSION: 1. No acute abnormality of the abdomen or pelvis. 2. Nonobstructive right nephrolithiasis. 3. Partially exophytic 12 mm left lower pole renal lesion with density greater than that expected for a simple cyst on today's examination but is unchanged in size from contrasted on CT June 12, 2019 at which point it demonstrate imaging characteristics consistent with a cyst, the change in density between the studies likely reflects hemorrhagic/proteinaceous material within this cyst. Consider further  evaluation with renal ultrasound. 4. Stable 2 cm left adrenal adenoma. 5. Bilateral fat containing inguinal hernias. 6. Lower lumbar predominant degenerative change causing bilateral L3-S1 neural foraminal impingement appears similar to prior.  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   Anxiety    Arthritis    Blood transfusion without reported diagnosis    Complication of anesthesia    bleed after last shoulder-had to stay overnight   Depression    Diabetes mellitus    Diabetic peripheral neuropathy (HCC) 03/28/2013   Diverticulosis    Dizziness    Hernia, inguinal    Hyperlipidemia    Hypertension    Impingement syndrome of left shoulder 08/02/2014   Ischemic colitis (HCC)    Multi-infarct state 10/14/2014   Neuropathy of lower extremity    Night sweats    every once in a while   Sleep apnea    no CPAP   Stroke (HCC) 02/2015   no deficits   Syncope and collapse     Past Surgical History:  Procedure Laterality Date   BIOPSY  06/14/2019   Procedure: BIOPSY;  Surgeon: Legrand Victory LITTIE DOUGLAS, MD;  Location: [facility];  Service: Gastroenterology;;   CERVICAL DISC SURGERY     COLON SURGERY  05/03/2001   1/3 removed for diverticulitis   COLONOSCOPY     COLONOSCOPY WITH PROPOFOL  N/A 06/14/2019   Procedure: COLONOSCOPY WITH PROPOFOL ;  Surgeon: Legrand Victory LITTIE DOUGLAS, MD;  Location: [facility];  Service: Gastroenterology;  Laterality: N/A;   EP IMPLANTABLE DEVICE N/A 05/01/2015   Procedure: Loop Recorder  Insertion;  Surgeon: Elspeth JAYSON Sage, MD;  Location: [facility] IN[facility]ASIVE CV LAB;  Service: Cardiovascular;  Laterality: N/A;   INGUINAL HERNIA REPAIR Bilateral    KNEE ARTHROSCOPY Right    SHOULDER ARTHROSCOPY Left 09/2022   SHOULDER ARTHROSCOPY Left 08/02/2014   Procedure: LEFT SHOULDER SCOPE DEBRIDEMENT/ACROMIOPLASTY;  Surgeon: Fonda Olmsted, MD;  Location: [facility];   Service: Orthopedics;  Laterality: Left;  ANESTHESIA: GENERAL, PRE/POST OP SCALENE   TEE WITHOUT CARDIOVERSION N/A 03/03/2015   Procedure: TRANSESOPHAGEAL ECHOCARDIOGRAM (TEE);  Surgeon: Pearla DELENA Rout, MD;  Location: [facility];  Service: Cardiology;  Laterality: N/A;   TRIGGER FINGER RELEASE Left 12/22/2022   Procedure: LEFT A-1 PULLEY RELEASE LONG FINGER;  Surgeon: Jerri Kay HERO, MD;  Location: [facility];  Service: Orthopedics;  Laterality: Left;   UMBILICAL HERNIA REPAIR     with other hernia repair with mesh   WRIST SURGERY Right    fusion    Current Outpatient Medications  Medication Sig Dispense Refill   ALPRAZolam  (XANAX ) 0.25 MG tablet Take 0.5 tablets (0.125 mg total) by mouth in the morning AND 1 tablet (0.25 mg total) daily in the afternoon AND 1 tablet (0.25 mg total) every evening. 75 tablet 3   apixaban  (ELIQUIS ) 5 MG TABS tablet Take 1 tablet (5 mg total) by mouth 2 (two) times daily. 180 tablet 3   atorvastatin  (LIPITOR) 20 MG tablet Take 1 tablet (20 mg total) by mouth daily. 90 tablet 1   Blood Glucose Monitoring Suppl (FREESTYLE LITE) w/Device KIT Use to check glucose twice daily 1 kit 0   Blood Pressure Monitoring (OMRON 3 SERIES BP MONITOR) DEVI USE AS DIRECTED. 1 each 0   Cholecalciferol  50 MCG (2000 UT) CAPS Take 1 capsule (2,000 Units total) by mouth daily with breakfast. 30 each 6   Continuous Glucose Sensor (FREESTYLE LIBRE 3 PLUS SENSOR) MISC Change sensor every 15 days. 6 each 3   Continuous Glucose Sensor (FREESTYLE LIBRE 3 PLUS SENSOR) MISC Change sensor every 15 days as directed by the provider to read blood glucose levels. 2 each 0   FLUoxetine  (PROZAC ) 20 MG capsule Take 1 capsule (20 mg total) by mouth daily. 90 capsule 1   fluticasone  (FLONASE ) 50 MCG/ACT nasal spray Place 1 spray into both nostrils daily. 16 g 3   folic acid  (FOLVITE ) 1 MG tablet Take 1 tablet (1 mg total) by mouth daily. 30 tablet 12   gabapentin  (NEURONTIN ) 300 MG  capsule Take 1 capsule (300 mg total) by mouth every morning AND 1 capsule (300 mg total) daily after lunch AND 2 capsules (600 mg total) at bedtime. 120 capsule 11   insulin  degludec (TRESIBA  FLEXTOUCH) 100 UNIT/ML FlexTouch Pen Inject 20 Units into the skin at bedtime. 18 mL 3   Insulin  Pen Needle (COMFORT EZ PEN NEEDLES) 31G X 6 MM MISC USE TO INJECT INSULIN  DAILY AS DIRECTED 100 each 2   Insulin  Pen Needle (UNIFINE PENTIPS) 31G X 6 MM MISC USE TO INJECT INSULIN  DAILY AS DIRECTED 100 each 1   metFORMIN  (GLUCOPHAGE ) 500 MG tablet Take 1 tablet (500 mg total) by mouth 2 (two) times daily with a meal. 180 tablet 3   Multiple Vitamin (MULTIVITAMIN WITH MINERALS) TABS tablet Take 1 tablet daily by mouth.     ondansetron  (ZOFRAN ) 4 MG tablet Take 1 tablet (4 mg total) by mouth every 8 (eight) hours as needed for nausea or vomiting. 40 tablet 0   traMADol  (ULTRAM ) 50 MG tablet  Take 1 tablet (50 mg total) by mouth 3 (three) times daily as needed. 30 tablet 1   valACYclovir  (VALTREX ) 1000 MG tablet Take 1 tablet (1,000 mg total) by mouth daily. 90 tablet 3   No current facility-administered medications for this visit.    Allergies as of 10/24/2023 - Review Complete 10/24/2023  Allergen Reaction Noted   Lisinopril  Cough 09/30/2016    Family History  Problem Relation Age of Onset   Stroke Father    Hyperlipidemia Father    Heart attack Sister 41   Stroke Sister    Dementia Mother    ALS Brother        age 45   Diabetes Maternal Grandfather    Colon cancer Neg Hx    Esophageal cancer Neg Hx    Stomach cancer Neg Hx    Rectal cancer Neg Hx     Social History   Socioeconomic History   Marital status: Married    Spouse name: Not on file   Number of children: 2   Years of education: College   Highest education level: Some college, no degree  Occupational History   Occupation: disabled  Tobacco Use   Smoking status: Former    Current packs/day: 0.00    Average packs/day: 1 pack/day  for 8.0 years (8.0 ttl pk-yrs)    Types: Cigarettes    Start date: 06/07/1970    Quit date: 05/03/1978    Years since quitting: 45.5   Smokeless tobacco: Former    Types: Chew    Quit date: 05/03/1978   Tobacco comments:    Former smoker 07/19/22  Vaping Use   Vaping status: Never Used  Substance and Sexual Activity   Alcohol use: Yes    Alcohol/week: 1.0 standard drink of alcohol    Types: 1 Cans of beer per week    Comment: 1 beer every 6 months h/o heavy use in the past 07/19/22   Drug use: No   Sexual activity: Yes  Other Topics Concern   Not on file  Social History Narrative   Drinks some coffee, Drink diet sodas and tea   Social Drivers of Corporate investment banker Strain: Low Risk  (10/10/2022)   Overall Financial Resource Strain (CARDIA)    Difficulty of Paying Living Expenses: Not hard at all  Food Insecurity: No Food Insecurity (10/10/2022)   Hunger Vital Sign    Worried About Running Out of Food in the Last Year: Never true    Ran Out of Food in the Last Year: Never true  Transportation Needs: No Transportation Needs (10/10/2022)   PRAPARE - Administrator, Civil Service (Medical): No    Lack of Transportation (Non-Medical): No  Physical Activity: Unknown (10/10/2022)   Exercise Vital Sign    Days of Exercise per Week: 0 days    Minutes of Exercise per Session: Not on file  Stress: No Stress Concern Present (10/10/2022)   [employer] of Occupational Health - Occupational Stress Questionnaire    Feeling of Stress : Only a little  Social Connections: Moderately Integrated (10/10/2022)   Social Connection and Isolation Panel    Frequency of Communication with Friends and Family: More than three times a week    Frequency of Social Gatherings with Friends and Family: Three times a week    Attends Religious Services: 1 to 4 times per year    Active Member of Clubs or Organizations: No    Attends Banker Meetings: Not  on file    Marital Status:  Married  Catering manager Violence: Not on file    Review of Systems:    Constitutional: No weight loss, fever, chills, weakness or fatigue HEENT: Eyes: No change in vision               Ears, Nose, Throat:  No change in hearing or congestion Skin: No rash or itching Cardiovascular: No chest pain, chest pressure or palpitations   Respiratory: No SOB or cough Gastrointestinal: See HPI and otherwise negative Genitourinary: No dysuria or change in urinary frequency Neurological: No headache, dizziness or syncope Musculoskeletal: No new muscle or joint pain Hematologic: No bleeding or bruising Psychiatric: No history of depression or anxiety    Physical Exam:  Vital signs: BP 118/74 (BP Location: Left Arm, Patient Position: Sitting, Cuff Size: Normal)   Pulse 78   Ht 6' (1.829 m)   Wt 175 lb 2 oz (79.4 kg)   BMI 23.75 kg/m   Constitutional: NAD, alert and cooperative Head:  Normocephalic and atraumatic. Eyes:   PEERL, EOMI. No icterus. Conjunctiva pink. Respiratory: Respirations even and unlabored. Lungs clear to auscultation bilaterally.   No wheezes, crackles, or rhonchi.  Cardiovascular:  Regular rate and rhythm. No peripheral edema, cyanosis or pallor.  Gastrointestinal:  Soft, nondistended, nontender. No rebound or guarding. Normal bowel sounds. No appreciable masses or hepatomegaly. Rectal:  Declines Msk:  Symmetrical without gross deformities. Without edema, no deformity or joint abnormality.  Neurologic:  Alert and  oriented x4;  grossly normal neurologically.  Skin:   Dry and intact without significant lesions or rashes. Psychiatric: Oriented to person, place and time. Demonstrates good judgement and reason without abnormal affect or behaviors.  Physical Exam CARDIOVASCULAR: Heart sounds normal.   RELEVANT LABS AND IMAGING: CBC    Component Value Date/Time   WBC 5.7 09/30/2023 1614   WBC 5.5 08/04/2023 1443   RBC 3.91 (L) 09/30/2023 1614   RBC 3.98 (L)  08/04/2023 1443   HGB 13.2 09/30/2023 1614   HCT 38.5 09/30/2023 1614   PLT 179 09/30/2023 1614   MCV 99 (H) 09/30/2023 1614   MCH 33.8 (H) 09/30/2023 1614   MCH 33.9 08/04/2023 1443   MCHC 34.3 09/30/2023 1614   MCHC 34.7 08/04/2023 1443   RDW 12.5 09/30/2023 1614   LYMPHSABS 1.5 09/30/2023 1614   MONOABS 0.8 05/12/2021 1520   EOSABS 0.4 09/30/2023 1614   BASOSABS 0.0 09/30/2023 1614    CMP     Component Value Date/Time   NA 139 03/01/2023 1130   NA 142 11/18/2022 1059   K 3.9 03/01/2023 1130   CL 108 03/01/2023 1130   CO2 28 03/01/2023 1130   GLUCOSE 184 (H) 03/01/2023 1130   BUN 12 03/01/2023 1130   BUN 14 11/18/2022 1059   CREATININE 0.99 03/01/2023 1130   CREATININE 0.82 02/16/2014 1053   CALCIUM  8.7 (L) 03/01/2023 1130   PROT 6.6 09/30/2023 1614   ALBUMIN 4.2 09/30/2023 1614   AST 19 09/30/2023 1614   ALT 7 09/30/2023 1614   ALKPHOS 81 09/30/2023 1614   BILITOT 0.6 09/30/2023 1614   GFRNONAA >60 03/01/2023 1130   GFRAA 92 05/30/2020 1156     Assessment/Plan:    63 year old male history of CVA, A-fib (on Eliquis ), ischemic colitis, s/p sigmoid resection for diverticular disease, right hernia, presents for evaluation of RUQ pain and constipation that has since resolved.  RUQ pain Constipation RUQ pain a few weeks ago after consuming [place] nuts and  also associated constipation.  Normal CBC/CMP.  CT 2023 with no gallstones.  Pain resolved once constipation resolved.  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  Patient is concerned there could have been something more so than constipation.  Recent reassuring colonoscopy.  IBS versus biliary dyskinesia (reassuringly symptoms have resolved) - If constipation returns please use fiber and IBgard - HIDA scan for evaluation of biliary dyskinesia - Reassurance - let us  know if recurrence or worsening symptoms  History of ischemic colitis 2021 colonoscopy with ischemic colitis  A-fib CVA On Eliquis   Colon cancer screening Last  colonoscopy in 2021 with repeat 2031 -- repeat 2031 (or sooner if symptoms arise)  Diabetes  Kaylem Gidney Mollie RIGGERS [facility] 10/24/2023, 2:32 PM  Cc: Alphonsa Glendia LABOR, MD

## 2023-11-03 ENCOUNTER — Telehealth: Payer: Self-pay

## 2023-11-03 ENCOUNTER — Other Ambulatory Visit (HOSPITAL_COMMUNITY): Payer: Self-pay

## 2023-11-03 NOTE — Telephone Encounter (Signed)
 Need letter unable to care for child again this year in chart from last year 11/2022 need new letter copy what has last year for [name] to put on file

## 2023-11-07 ENCOUNTER — Other Ambulatory Visit: Payer: Self-pay

## 2023-11-07 ENCOUNTER — Other Ambulatory Visit (HOSPITAL_COMMUNITY): Payer: Self-pay

## 2023-11-07 ENCOUNTER — Encounter: Payer: Self-pay | Admitting: Family Medicine

## 2023-11-07 MED FILL — Folic Acid Tab 1 MG: ORAL | 90 days supply | Qty: 90 | Fill #6 | Status: AC

## 2023-11-07 NOTE — Telephone Encounter (Signed)
 I dictated a letter as requested, feel free to print for [name]

## 2023-11-08 ENCOUNTER — Encounter (HOSPITAL_COMMUNITY): Payer: Self-pay

## 2023-11-08 ENCOUNTER — Ambulatory Visit (HOSPITAL_COMMUNITY)
Admission: RE | Admit: 2023-11-08 | Discharge: 2023-11-08 | Disposition: A | Discharge status: Home / Self Care | Source: Ambulatory Visit | Attending: Gastroenterology | Admitting: Gastroenterology

## 2023-11-08 DIAGNOSIS — R1011 Right upper quadrant pain: Secondary | ICD-10-CM | POA: Diagnosis not present

## 2023-11-08 MED ORDER — TECHNETIUM TC 99M MEBROFENIN IV KIT
7.9000 | PACK | Freq: Once | INTRAVENOUS | Status: AC
  Administered 2023-11-08: 7.9 via INTRAVENOUS

## 2023-11-11 ENCOUNTER — Other Ambulatory Visit: Payer: Self-pay

## 2023-11-11 ENCOUNTER — Other Ambulatory Visit (HOSPITAL_COMMUNITY): Payer: Self-pay

## 2023-11-11 ENCOUNTER — Telehealth: Payer: Self-pay | Admitting: Gastroenterology

## 2023-11-11 NOTE — Telephone Encounter (Signed)
 Dr Abran,   Would you please look at 11/08/23 hepatobiliary scan and advise?  Imaging was ordered by [facility], who as you know is currently out of the office for an extended amount of time.  Thank you

## 2023-11-11 NOTE — Telephone Encounter (Signed)
 Inbound call from patient requesting an update on 7/8 imaging results. Please advise, thank you

## 2023-11-11 NOTE — Telephone Encounter (Signed)
 Colon recall placed for 01/2030. Patient's wife is advised of corrected recall date and she verbalizes understanding.

## 2023-11-11 NOTE — Telephone Encounter (Signed)
 That finding is of no clinical significance

## 2023-11-11 NOTE — Telephone Encounter (Signed)
 Spoke to patient's wife (ok per DPR) to advise that HIDA scan showed no obstruction/abnormality of bile ducts. Also advised that gallbladder ejection fraction was elevated meaning the gallbladder may contract more forcefully or frequently than normal though we do not feel this is of any clinical significance at this time and does not need specific follow up. She verbalizes understanding.  Additionally, Ms.Serano requests to know when patient is due for repeat colonoscopy. In recent office note, a 2031 recall date is given. However, a recall is in EPIC for 2027 (I think this may be an old recall from his 2017  procedure).   His 2021 procedure showed acute right sided ischemic colitis and remote sigmoid resection for diverticulitis. No personal history of colon polyps, no family history of colon cancer.  Could you clarify the correct recall date? I will be glad to update your recommended date into the system and advise patient.

## 2023-11-11 NOTE — Telephone Encounter (Signed)
 2031.  Thanks

## 2023-11-18 ENCOUNTER — Other Ambulatory Visit: Payer: Self-pay

## 2023-11-18 ENCOUNTER — Other Ambulatory Visit (HOSPITAL_COMMUNITY): Payer: Self-pay

## 2023-11-22 ENCOUNTER — Ambulatory Visit: Admitting: Nurse Practitioner

## 2023-11-22 ENCOUNTER — Encounter: Payer: Self-pay | Admitting: Nurse Practitioner

## 2023-11-22 ENCOUNTER — Other Ambulatory Visit: Payer: Self-pay

## 2023-11-22 ENCOUNTER — Other Ambulatory Visit (HOSPITAL_COMMUNITY): Payer: Self-pay

## 2023-11-22 VITALS — BP 130/80 | HR 87 | Ht 73.0 in | Wt 177.8 lb

## 2023-11-22 DIAGNOSIS — Z794 Long term (current) use of insulin: Secondary | ICD-10-CM | POA: Diagnosis not present

## 2023-11-22 DIAGNOSIS — D3502 Benign neoplasm of left adrenal gland: Secondary | ICD-10-CM

## 2023-11-22 DIAGNOSIS — E782 Mixed hyperlipidemia: Secondary | ICD-10-CM

## 2023-11-22 DIAGNOSIS — E559 Vitamin D deficiency, unspecified: Secondary | ICD-10-CM | POA: Diagnosis not present

## 2023-11-22 DIAGNOSIS — Z7984 Long term (current) use of oral hypoglycemic drugs: Secondary | ICD-10-CM

## 2023-11-22 DIAGNOSIS — E1159 Type 2 diabetes mellitus with other circulatory complications: Secondary | ICD-10-CM | POA: Diagnosis not present

## 2023-11-22 MED ORDER — FREESTYLE LIBRE 3 PLUS SENSOR MISC
3 refills | Status: DC
  Filled 2023-11-22: qty 6, fill #0
  Filled 2024-01-27: qty 6, 90d supply, fill #0

## 2023-11-22 MED ORDER — COMFORT EZ PEN NEEDLES 31G X 6 MM MISC
2 refills | Status: DC
  Filled 2023-11-22: qty 100, fill #0
  Filled 2024-01-22: qty 100, 100d supply, fill #0

## 2023-11-22 MED ORDER — TRESIBA FLEXTOUCH 100 UNIT/ML ~~LOC~~ SOPN
25.0000 [IU] | PEN_INJECTOR | Freq: Every day | SUBCUTANEOUS | 3 refills | Status: AC
  Filled 2023-11-22: qty 30, 120d supply, fill #0
  Filled 2023-12-29 – 2024-03-27 (×2): qty 24, 96d supply, fill #0
  Filled 2024-06-08: qty 21, 84d supply, fill #1
  Filled 2024-06-08: qty 24, 96d supply, fill #0

## 2023-11-22 MED ORDER — METFORMIN HCL 500 MG PO TABS
500.0000 mg | ORAL_TABLET | Freq: Two times a day (BID) | ORAL | 3 refills | Status: AC
  Filled 2023-11-22 – 2024-01-27 (×2): qty 180, 90d supply, fill #0
  Filled 2024-04-15: qty 180, 90d supply, fill #1

## 2023-11-22 NOTE — Progress Notes (Signed)
 11/22/2023, 11:46 AM              Endocrinology follow-up note    Subjective:    Patient ID: Travis [name], male    DOB: April 22, 1961.  Travis [name] is being seen in follow-up  for management of currently uncontrolled symptomatic diabetes requested by  Alphonsa Glendia LABOR, MD.   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   Anxiety    Arthritis    Blood transfusion without reported diagnosis    Complication of anesthesia    bleed after last shoulder-had to stay overnight   Depression    Diabetes mellitus    Diabetic peripheral neuropathy (HCC) 03/28/2013   Diverticulosis    Dizziness    Hernia, inguinal    Hyperlipidemia    Hypertension    Impingement syndrome of left shoulder 08/02/2014   Ischemic colitis (HCC)    Multi-infarct state 10/14/2014   Neuropathy of lower extremity    Night sweats    every once in a while   Sleep apnea    no CPAP   Stroke (HCC) 02/2015   no deficits   Syncope and collapse    Past Surgical History:  Procedure Laterality Date   BIOPSY  06/14/2019   Procedure: BIOPSY;  Surgeon: Legrand Victory LITTIE DOUGLAS, MD;  Location: [facility];  Service: Gastroenterology;;   CERVICAL DISC SURGERY     COLON SURGERY  05/03/2001   1/3 removed for diverticulitis   COLONOSCOPY     COLONOSCOPY WITH PROPOFOL  N/A 06/14/2019   Procedure: COLONOSCOPY WITH PROPOFOL ;  Surgeon: Legrand Victory LITTIE DOUGLAS, MD;  Location: [facility];  Service: Gastroenterology;  Laterality: N/A;   EP IMPLANTABLE DEVICE N/A 05/01/2015   Procedure: Loop Recorder Insertion;  Surgeon: Elspeth JAYSON Sage, MD;  Location: [facility] INVASIVE CV LAB;  Service: Cardiovascular;  Laterality: N/A;   INGUINAL HERNIA REPAIR Bilateral    KNEE ARTHROSCOPY Right    SHOULDER ARTHROSCOPY Left 09/2022   SHOULDER ARTHROSCOPY Left 08/02/2014   Procedure: LEFT  SHOULDER SCOPE DEBRIDEMENT/ACROMIOPLASTY;  Surgeon: Fonda Olmsted, MD;  Location: [facility];  Service: Orthopedics;  Laterality: Left;  ANESTHESIA: GENERAL, PRE/POST OP SCALENE   TEE WITHOUT CARDIOVERSION N/A 03/03/2015   Procedure: TRANSESOPHAGEAL ECHOCARDIOGRAM (TEE);  Surgeon: Pearla LABOR Rout, MD;  Location: [facility];  Service: Cardiology;  Laterality: N/A;   TRIGGER FINGER RELEASE Left 12/22/2022   Procedure: LEFT A-1 PULLEY RELEASE LONG FINGER;  Surgeon: Jerri Kay HERO, MD;  Location: [facility];  Service: Orthopedics;  Laterality: Left;   UMBILICAL HERNIA REPAIR     with other hernia repair with mesh   WRIST SURGERY Right    fusion   Social History   Socioeconomic History   Marital status: Married    Spouse name: Not on file   Number of children: 2   Years of education: College   Highest education level: Some college, no degree  Occupational History   Occupation: disabled  Tobacco Use   Smoking status: Former    Current packs/day: 0.00    Average packs/day: 1 pack/day for 8.0 years (8.0  ttl pk-yrs)    Types: Cigarettes    Start date: 06/07/1970    Quit date: 05/03/1978    Years since quitting: 45.5   Smokeless tobacco: Former    Types: Chew    Quit date: 05/03/1978   Tobacco comments:    Former smoker 07/19/22  Vaping Use   Vaping status: Never Used  Substance and Sexual Activity   Alcohol use: Yes    Alcohol/week: 1.0 standard drink of alcohol    Types: 1 Cans of beer per week    Comment: 1 beer every 6 months h/o heavy use in the past 07/19/22   Drug use: No   Sexual activity: Yes  Other Topics Concern   Not on file  Social History Narrative   Drinks some coffee, Drink diet sodas and tea   Social Drivers of Corporate investment banker Strain: Low Risk  (10/10/2022)   Overall Financial Resource Strain (CARDIA)    Difficulty of Paying Living Expenses: Not hard at all  Food Insecurity: No Food Insecurity (10/10/2022)   Hunger Vital  Sign    Worried About Running Out of Food in the Last Year: Never true    Ran Out of Food in the Last Year: Never true  Transportation Needs: No Transportation Needs (10/10/2022)   PRAPARE - Administrator, Civil Service (Medical): No    Lack of Transportation (Non-Medical): No  Physical Activity: Unknown (10/10/2022)   Exercise Vital Sign    Days of Exercise per Week: 0 days    Minutes of Exercise per Session: Not on file  Stress: No Stress Concern Present (10/10/2022)   [employer] of Occupational Health - Occupational Stress Questionnaire    Feeling of Stress : Only a little  Social Connections: Moderately Integrated (10/10/2022)   Social Connection and Isolation Panel    Frequency of Communication with Friends and Family: More than three times a week    Frequency of Social Gatherings with Friends and Family: Three times a week    Attends Religious Services: 1 to 4 times per year    Active Member of Clubs or Organizations: No    Attends Banker Meetings: Not on file    Marital Status: Married   Outpatient Encounter Medications as of 11/22/2023  Medication Sig   ALPRAZolam  (XANAX ) 0.25 MG tablet Take 0.5 tablets (0.125 mg total) by mouth in the morning AND 1 tablet (0.25 mg total) daily in the afternoon AND 1 tablet (0.25 mg total) every evening.   apixaban  (ELIQUIS ) 5 MG TABS tablet Take 1 tablet (5 mg total) by mouth 2 (two) times daily.   atorvastatin  (LIPITOR) 20 MG tablet Take 1 tablet (20 mg total) by mouth daily.   Blood Glucose Monitoring Suppl (FREESTYLE LITE) w/Device KIT Use to check glucose twice daily   Blood Pressure Monitoring (OMRON 3 SERIES BP MONITOR) DEVI USE AS DIRECTED.   Cholecalciferol  50 MCG (2000 UT) CAPS Take 1 capsule (2,000 Units total) by mouth daily with breakfast.   Continuous Glucose Sensor (FREESTYLE LIBRE 3 PLUS SENSOR) MISC Change sensor every 15 days as directed by the provider to read blood glucose levels.   FLUoxetine   (PROZAC ) 20 MG capsule Take 1 capsule (20 mg total) by mouth daily.   folic acid  (FOLVITE ) 1 MG tablet Take 1 tablet (1 mg total) by mouth daily.   gabapentin  (NEURONTIN ) 300 MG capsule Take 1 capsule (300 mg total) by mouth every morning AND 1 capsule (300 mg total) daily  after lunch AND 2 capsules (600 mg total) at bedtime.   Insulin  Pen Needle (UNIFINE PENTIPS) 31G X 6 MM MISC USE TO INJECT INSULIN  DAILY AS DIRECTED   Multiple Vitamin (MULTIVITAMIN WITH MINERALS) TABS tablet Take 1 tablet daily by mouth.   ondansetron  (ZOFRAN ) 4 MG tablet Take 1 tablet (4 mg total) by mouth every 8 (eight) hours as needed for nausea or vomiting.   traMADol  (ULTRAM ) 50 MG tablet Take 1 tablet (50 mg total) by mouth 3 (three) times daily as needed.   valACYclovir  (VALTREX ) 1000 MG tablet Take 1 tablet (1,000 mg total) by mouth daily.   [DISCONTINUED] Continuous Glucose Sensor (FREESTYLE LIBRE 3 PLUS SENSOR) MISC Change sensor every 15 days.   [DISCONTINUED] insulin  degludec (TRESIBA  FLEXTOUCH) 100 UNIT/ML FlexTouch Pen Inject 20 Units into the skin at bedtime.   [DISCONTINUED] Insulin  Pen Needle (COMFORT EZ PEN NEEDLES) 31G X 6 MM MISC USE TO INJECT INSULIN  DAILY AS DIRECTED   [DISCONTINUED] metFORMIN  (GLUCOPHAGE ) 500 MG tablet Take 1 tablet (500 mg total) by mouth 2 (two) times daily with a meal.   Continuous Glucose Sensor (FREESTYLE LIBRE 3 PLUS SENSOR) MISC Change sensor every 15 days.   fluticasone  (FLONASE ) 50 MCG/ACT nasal spray Place 1 spray into both nostrils daily.   insulin  degludec (TRESIBA  FLEXTOUCH) 100 UNIT/ML FlexTouch Pen Inject 25 Units into the skin at bedtime.   Insulin  Pen Needle (COMFORT EZ PEN NEEDLES) 31G X 6 MM MISC USE TO INJECT INSULIN  DAILY AS DIRECTED   metFORMIN  (GLUCOPHAGE ) 500 MG tablet Take 1 tablet (500 mg total) by mouth 2 (two) times daily with a meal.   [DISCONTINUED] Insulin  Glargine (BASAGLAR  KWIKPEN) 100 UNIT/ML INJECT 50 UNITS INTO THE SKIN AT BEDTIME   No  facility-administered encounter medications on file as of 11/22/2023.    ALLERGIES: Allergies  Allergen Reactions   Lisinopril  Cough    Patient/spouse is not aware/familiar with why this is listed as an allergy Not a true allergy but on this list to avoid ACE inhibitors-SA Luking MD    VACCINATION STATUS: Immunization History  Administered Date(s) Administered   Influenza Split 03/28/2013   Influenza, Seasonal, Injecte, Preservative Fre 02/22/2023   Influenza,inj,Quad PF,6+ Mos 02/18/2014, 01/23/2015, 02/06/2016, 02/23/2017, 02/24/2018, 01/31/2020, 02/18/2021   Influenza-Unspecified 01/02/2011, 02/09/2019, 03/17/2022   Moderna Sars-Covid-2 Vaccination 07/21/2019, 08/22/2019, 05/01/2020   PNEUMOCOCCAL CONJUGATE-20 10/01/2021   Pfizer(Comirnaty )Fall Seasonal Vaccine 12 years and older 05/10/2022   Pneumococcal Polysaccharide-23 01/31/2010, 03/01/2015   Respiratory Syncytial Virus Vaccine ,Recomb Aduvanted(Arexvy ) 05/18/2022   Td (Adult),5 Lf Tetanus Toxid, Preservative Free 02/21/2003   Tdap 02/21/2003, 02/18/2014   Zoster Recombinant(Shingrix ) 04/19/2019, 06/29/2019    Diabetes He presents for his follow-up diabetic visit. He has type 2 diabetes mellitus. Onset time: He was diagnosed at approximate age of 67 years. His disease course has been fluctuating (He has prior history of heavy alcohol use/abuse.). There are no hypoglycemic associated symptoms. Pertinent negatives for hypoglycemia include no confusion, headaches, pallor or seizures. Associated symptoms include foot paresthesias. Pertinent negatives for diabetes include no chest pain, no fatigue, no polydipsia, no polyphagia, no polyuria and no weakness. There are no hypoglycemic complications. Symptoms are stable. Diabetic complications include a CVA, nephropathy and peripheral neuropathy. Risk factors for coronary artery disease include dyslipidemia, diabetes mellitus, hypertension, family history, male sex, tobacco exposure and  sedentary lifestyle. Current diabetic treatment includes insulin  injections and oral agent (monotherapy). He is compliant with treatment most of the time. His weight is fluctuating minimally. He is following a generally unhealthy  diet. When asked about meal planning, he reported none. He has had a previous visit with a dietitian. He participates in exercise intermittently. His home blood glucose trend is fluctuating dramatically. His overall blood glucose range is 180-200 mg/dl. (He presents today with his CGM showing above target glycemic profile overall.  His most recent A1c on 5/30 was 8.2% improving from last visit of 8.5%.  Analysis of his CGM shows TIR 41%, TAR 59%, TBR 0% with a GMI of 7.9%.  He admits he has been eating more lately, snacking later in the evenings.) An ACE inhibitor/angiotensin II receptor blocker is not being taken. He does not see a podiatrist.Eye exam is current.  Hyperlipidemia This is a chronic problem. The current episode started more than 1 year ago. The problem is controlled. Recent lipid tests were reviewed and are normal. Exacerbating diseases include chronic renal disease and diabetes. Factors aggravating his hyperlipidemia include smoking. Pertinent negatives include no chest pain, myalgias or shortness of breath. Current antihyperlipidemic treatment includes statins. The current treatment provides moderate improvement of lipids. There are no compliance problems.  Risk factors for coronary artery disease include dyslipidemia, diabetes mellitus, hypertension, male sex, family history and a sedentary lifestyle.  Hypertension This is a chronic problem. The current episode started more than 1 year ago. The problem has been resolved since onset. The problem is controlled. Pertinent negatives include no chest pain, headaches, neck pain, palpitations or shortness of breath. There are no associated agents to hypertension. Risk factors for coronary artery disease include dyslipidemia,  diabetes mellitus, family history, male gender, sedentary lifestyle and smoking/tobacco exposure. Past treatments include nothing. Compliance problems include diet.  Hypertensive end-organ damage includes kidney disease, CAD/MI and CVA. Identifiable causes of hypertension include chronic renal disease and sleep apnea.    Review of systems  Constitutional: + Minimally fluctuating body weight,  current Body mass index is 23.46 kg/m. , no fatigue, no subjective hyperthermia, no subjective hypothermia Eyes: no blurry vision, no xerophthalmia ENT: no sore throat, no nodules palpated in throat, no dysphagia/odynophagia, no hoarseness Cardiovascular: no chest pain, no shortness of breath, no palpitations, no leg swelling Respiratory: no cough, no shortness of breath Gastrointestinal: no nausea/vomiting/diarrhea Musculoskeletal: + bilateral shoulder pain (chronic) Skin: no rashes, no hyperemia Neurological: no tremors, no numbness, no tingling, no dizziness Psychiatric: no depression, no anxiety  Objective:    BP 130/80 (BP Location: Left Arm, Patient Position: Sitting, Cuff Size: Large)   Pulse 87   Ht 6' 1 (1.854 m)   Wt 177 lb 12.8 oz (80.6 kg)   BMI 23.46 kg/m   Wt Readings from Last 3 Encounters:  11/22/23 177 lb 12.8 oz (80.6 kg)  10/24/23 175 lb 2 oz (79.4 kg)  09/30/23 184 lb 6.4 oz (83.6 kg)    BP Readings from Last 3 Encounters:  11/22/23 130/80  10/24/23 118/74  09/30/23 114/70     Physical Exam- Limited  Constitutional:  Body mass index is 23.46 kg/m. , not in acute distress, + inattentive, hyperactive state of mind with rapid/pressured speech Eyes:  EOMI, no exophthalmos Musculoskeletal: no gross deformities, strength intact in all four extremities, no gross restriction of joint movements Skin:  no rashes, no hyperemia Neurological: no tremor with outstretched hands   Diabetic Foot Exam - Simple   No data filed      CMP     Component Value Date/Time   NA  139 03/01/2023 1130   NA 142 11/18/2022 1059   K 3.9 03/01/2023 1130  CL 108 03/01/2023 1130   CO2 28 03/01/2023 1130   GLUCOSE 184 (H) 03/01/2023 1130   BUN 12 03/01/2023 1130   BUN 14 11/18/2022 1059   CREATININE 0.99 03/01/2023 1130   CREATININE 0.82 02/16/2014 1053   CALCIUM  8.7 (L) 03/01/2023 1130   PROT 6.6 09/30/2023 1614   ALBUMIN 4.2 09/30/2023 1614   AST 19 09/30/2023 1614   ALT 7 09/30/2023 1614   ALKPHOS 81 09/30/2023 1614   BILITOT 0.6 09/30/2023 1614   GFRNONAA >60 03/01/2023 1130   GFRAA 92 05/30/2020 1156     Diabetic Labs (most recent): Lab Results  Component Value Date   HGBA1C 8.2 (H) 09/30/2023   HGBA1C 7.4 (A) 04/12/2023   HGBA1C 7.7 (H) 11/18/2022   MICROALBUR 5.7 12/07/2019   MICROALBUR 0.8 02/16/2014     Lipid Panel ( most recent) Lipid Panel     Component Value Date/Time   CHOL 99 (L) 09/30/2023 1614   TRIG 98 09/30/2023 1614   HDL 30 (L) 09/30/2023 1614   CHOLHDL 3.3 09/30/2023 1614   CHOLHDL 3.2 05/03/2016 0545   VLDL 16 05/03/2016 0545   LDLCALC 50 09/30/2023 1614      Lab Results  Component Value Date   TSH 1.390 07/24/2021   TSH 2.120 12/07/2019   TSH 2.12 12/07/2019   TSH 1.960 03/12/2017   TSH 1.460 10/07/2016   TSH 0.720 05/02/2016   TSH 1.895 10/14/2014   FREET4 1.25 07/24/2021   FREET4 1.19 12/07/2019   FREET4 1.21 10/07/2016      Assessment & Plan:   1) DM type 2 causing vascular disease (HCC)  - Travis [name] has currently uncontrolled symptomatic type 2 DM since  63 years of age.  He presents today with his CGM showing above target glycemic profile overall.  His most recent A1c on 5/30 was 8.2% improving from last visit of 8.5%.  Analysis of his CGM shows TIR 41%, TAR 59%, TBR 0% with a GMI of 7.9%.  He admits he has been eating more lately, snacking later in the evenings.  -his diabetes is complicated by CVA, history of smoking, history of heavy alcohol use/abuse in the past and TRAVELL DESAULNIERS remains at  a high risk for more acute and chronic complications which include CAD, CVA, CKD, retinopathy, and neuropathy. These are all discussed in detail with the patient.  - Nutritional counseling repeated at each appointment due to patients tendency to fall back in to old habits.  - The patient admits there is a room for improvement in their diet and drink choices. -  Suggestion is made for the patient to avoid simple carbohydrates from their diet including Cakes, Sweet Desserts / Pastries, Ice Cream, Soda (diet and regular), Sweet Tea, Candies, Chips, Cookies, Sweet Pastries, Store Bought Juices, Alcohol in Excess of 1-2 drinks a day, Artificial Sweeteners, Coffee Creamer, and Sugar-free Products. This will help patient to have stable blood glucose profile and potentially avoid unintended weight gain.   - I encouraged the patient to switch to unprocessed or minimally processed complex starch and increased protein intake (animal or plant source), fruits, and vegetables.   - Patient is advised to stick to a routine mealtimes to eat 3 meals a day and avoid unnecessary snacks (to snack only to correct hypoglycemia).  - I have approached him with the following individualized plan to manage diabetes and patient agrees:   - There could be a component of pancreatic endocrine and exocrine insufficiency given his prior  history of heavy alcohol use.  Therefore, he may need multiple daily injections of insulin  to control diabetes in the future.  -#1 priority in this patient is to avoid hypoglycemia.    -He is advised to increase Tresiba  to 25 units SQ nightly and continue Metformin  500 mg po twice daily with meals.  He will do best to AVOID late night snacking.  -He is advised to monitor blood glucose twice daily (now using his CGM), before breakfast and before bed, and call the clinic if he has readings less than 70 or greater than 200 for 3 tests in a row.  He is benefiting from CGM device, is advised to  continue.  -Patient is not a candidate for SGLT2 inhibitors, nor incretin therapy due to his body habitus.  - Patient specific target  A1c;  LDL, HDL, Triglycerides, and  Waist Circumference were discussed in detail.  2) BP/HTN:   His blood pressure is controlled to target.  He is not on any antihypertensive medications at this time.  He will be considered for low dose ARB on subsequent visits if BP becomes elevated over 140/90.  He does monitor BP at home and reports normal readings.  3) Lipids/HPL:  His most recent lipid panel from 09/30/23 shows controlled LDL at 50.  He is advised to continue Lipitor 20 mg po daily at bedtime.  Side effects and precautions discussed with him.    4) Left adrenal adenoma- During a past hospitalization for GI bleed from ischemic colitis a CT scan showed incidental finding of 2.1 cm left adrenal adenoma.  Subsequent plasma metanephrines were within normal limits, considered nonfunctional.  He will not need intervention for it at this time. His repeat 24-hr urine metanephrines, catecholamines, cortisol, and aldosterone were all still normal favoring benignity.    5)  Weight/Diet: His Body mass index is 23.46 kg/m.  He is not a weight loss candidate.  CDE Consult  is in progress , exercise, and detailed carbohydrates information provided.  If he experiences unintentional weight loss, he will be considered for Creon therapy.   6) Chronic Care/Health Maintenance: -he is on Statin medications and  is encouraged to continue to follow up with Ophthalmology, Dentist,  Podiatrist at least yearly or according to recommendations, and advised to  stay away from smoking. I have recommended yearly flu vaccine and pneumonia vaccination at least every 5 years; moderate intensity exercise for up to 150 minutes weekly; and  sleep for at least 7 hours a day.  - I advised patient to maintain close follow up with Alphonsa Glendia LABOR, MD for primary care needs.     I spent  21   minutes in the care of the patient today including review of labs from CMP, Lipids, Thyroid  Function, Hematology (current and previous including abstractions from other facilities); face-to-face time discussing  his blood glucose readings/logs, discussing hypoglycemia and hyperglycemia episodes and symptoms, medications doses, his options of short and long term treatment based on the latest standards of care / guidelines;  discussion about incorporating lifestyle medicine;  and documenting the encounter. Risk reduction counseling performed per USPSTF guidelines to reduce obesity and cardiovascular risk factors.     Please refer to Patient Instructions for Blood Glucose Monitoring and Insulin /Medications Dosing Guide  in media tab for additional information. Please  also refer to  Patient Self Inventory in the Media  tab for reviewed elements of pertinent patient history.  Travis [name] participated in the discussions, expressed understanding, and voiced  agreement with the above plans.  All questions were answered to his satisfaction. he is encouraged to contact clinic should he have any questions or concerns prior to his return visit.    Follow up plan: - Return in about 3 months (around 02/22/2024) for Diabetes F/U with A1c in office, No previsit labs, Bring meter and logs.   Benton Rio, [facility] [address] [place], [place] [address] Phone: [phone] Fax: [phone]  11/22/2023, 11:46 AM

## 2023-11-29 ENCOUNTER — Other Ambulatory Visit: Payer: Self-pay

## 2023-11-29 ENCOUNTER — Encounter: Payer: Self-pay | Admitting: Family Medicine

## 2023-11-29 ENCOUNTER — Other Ambulatory Visit (HOSPITAL_COMMUNITY): Payer: Self-pay

## 2023-11-29 ENCOUNTER — Ambulatory Visit: Payer: Commercial Managed Care - PPO | Admitting: Family Medicine

## 2023-11-29 VITALS — BP 124/85 | HR 58 | Temp 97.4°F | Ht 73.0 in | Wt 178.0 lb

## 2023-11-29 DIAGNOSIS — Z0000 Encounter for general adult medical examination without abnormal findings: Secondary | ICD-10-CM | POA: Diagnosis not present

## 2023-11-29 MED ORDER — VALACYCLOVIR HCL 1 G PO TABS
1000.0000 mg | ORAL_TABLET | Freq: Every day | ORAL | 3 refills | Status: AC
  Filled 2023-11-29 – 2024-03-10 (×3): qty 90, 90d supply, fill #0
  Filled 2024-06-08: qty 90, 90d supply, fill #1

## 2023-11-29 MED ORDER — ATORVASTATIN CALCIUM 20 MG PO TABS
20.0000 mg | ORAL_TABLET | Freq: Every day | ORAL | 1 refills | Status: AC
  Filled 2023-11-29 – 2024-02-14 (×2): qty 90, 90d supply, fill #0
  Filled 2024-05-01: qty 90, 90d supply, fill #1

## 2023-11-29 MED ORDER — ALPRAZOLAM 0.25 MG PO TABS
ORAL_TABLET | ORAL | 3 refills | Status: DC
  Filled 2023-11-29 – 2023-12-06 (×2): qty 75, 30d supply, fill #0
  Filled 2024-01-08: qty 75, 30d supply, fill #1
  Filled 2024-02-06: qty 75, 30d supply, fill #2
  Filled 2024-03-10: qty 75, 30d supply, fill #3

## 2023-11-29 MED ORDER — FOLIC ACID 1 MG PO TABS
1.0000 mg | ORAL_TABLET | Freq: Every day | ORAL | 12 refills | Status: AC
  Filled 2023-11-29 – 2024-02-04 (×2): qty 30, 30d supply, fill #0
  Filled 2024-03-10: qty 30, 30d supply, fill #1
  Filled 2024-04-06: qty 30, 30d supply, fill #2
  Filled 2024-05-01: qty 30, 30d supply, fill #3
  Filled 2024-06-08: qty 30, 30d supply, fill #4

## 2023-11-29 MED ORDER — FLUTICASONE PROPIONATE 50 MCG/ACT NA SUSP
1.0000 | Freq: Every day | NASAL | 3 refills | Status: AC
  Filled 2023-11-29 – 2024-03-27 (×3): qty 16, 60d supply, fill #0
  Filled 2024-06-01: qty 16, 60d supply, fill #1

## 2023-11-29 NOTE — Progress Notes (Signed)
   Subjective:    Patient ID: Travis [name], male    DOB: 1960/09/25, 63 y.o.   MRN: [id number]  HPIHyperlipidemia associated with type 2 diabetes mellitus   The patient comes in today for a wellness visit.    A review of their health history was completed.  A review of medications was also completed.  Any needed refills; will go ahead with refills today  Eating habits: Overall fairly good eating habits  Falls/  MVA accidents in past few months: No accidents or injuries  Regular exercise: Stays physically active tries to fit in some walking  Specialist pt sees on regular basis: Endocrinology  Preventative health issues were discussed.   Additional concerns: Feels he is overall doing well   Review of Systems     Objective:   Physical Exam General-in no acute distress Eyes-no discharge Lungs-respiratory rate normal, CTA CV-no murmurs,RRR Extremities skin warm dry no edema Neuro grossly normal Behavior normal, alert  Patient defers prostate exam today would like to do it on next visit      Assessment & Plan:  Adult wellness-complete.wellness physical was conducted today. Importance of diet and exercise were discussed in detail.  Importance of stress reduction and healthy living were discussed.  In addition to this a discussion regarding safety was also covered.  We also reviewed over immunizations and gave recommendations regarding current immunization needed for age.   In addition to this additional areas were also touched on including: Preventative health exams needed:  Colonoscopy 2031  Patient was advised yearly wellness exam  Follow-up 6 months

## 2023-12-06 ENCOUNTER — Other Ambulatory Visit: Payer: Self-pay

## 2023-12-06 ENCOUNTER — Other Ambulatory Visit (HOSPITAL_COMMUNITY): Payer: Self-pay

## 2023-12-07 ENCOUNTER — Other Ambulatory Visit (HOSPITAL_COMMUNITY): Payer: Self-pay

## 2023-12-16 ENCOUNTER — Other Ambulatory Visit: Payer: Self-pay

## 2023-12-16 ENCOUNTER — Other Ambulatory Visit: Payer: Self-pay | Admitting: Family Medicine

## 2023-12-19 MED ORDER — FLUOXETINE HCL 20 MG PO CAPS
20.0000 mg | ORAL_CAPSULE | Freq: Every day | ORAL | 1 refills | Status: DC
  Filled 2023-12-19: qty 90, 90d supply, fill #0
  Filled 2024-03-12: qty 90, 90d supply, fill #1

## 2023-12-20 ENCOUNTER — Other Ambulatory Visit (HOSPITAL_COMMUNITY): Payer: Self-pay

## 2023-12-29 ENCOUNTER — Other Ambulatory Visit: Payer: Self-pay

## 2023-12-29 ENCOUNTER — Other Ambulatory Visit (HOSPITAL_COMMUNITY): Payer: Self-pay

## 2024-01-09 ENCOUNTER — Other Ambulatory Visit: Payer: Self-pay

## 2024-01-22 ENCOUNTER — Other Ambulatory Visit (HOSPITAL_COMMUNITY): Payer: Self-pay

## 2024-01-23 ENCOUNTER — Other Ambulatory Visit: Payer: Self-pay

## 2024-01-26 ENCOUNTER — Ambulatory Visit (INDEPENDENT_AMBULATORY_CARE_PROVIDER_SITE_OTHER): Admitting: Family Medicine

## 2024-01-26 DIAGNOSIS — Z23 Encounter for immunization: Secondary | ICD-10-CM

## 2024-01-27 ENCOUNTER — Other Ambulatory Visit (HOSPITAL_COMMUNITY): Payer: Self-pay

## 2024-01-27 ENCOUNTER — Other Ambulatory Visit: Payer: Self-pay

## 2024-01-30 NOTE — Progress Notes (Signed)
 Patient had flu shot via nurse today

## 2024-02-05 ENCOUNTER — Other Ambulatory Visit (HOSPITAL_COMMUNITY): Payer: Self-pay

## 2024-02-06 ENCOUNTER — Other Ambulatory Visit: Payer: Self-pay

## 2024-02-07 ENCOUNTER — Other Ambulatory Visit (HOSPITAL_COMMUNITY): Payer: Self-pay

## 2024-02-07 ENCOUNTER — Other Ambulatory Visit: Payer: Self-pay

## 2024-02-15 ENCOUNTER — Ambulatory Visit: Payer: 59 | Admitting: Adult Health

## 2024-02-15 ENCOUNTER — Encounter: Payer: Self-pay | Admitting: Adult Health

## 2024-02-15 ENCOUNTER — Other Ambulatory Visit: Payer: Self-pay

## 2024-02-15 ENCOUNTER — Other Ambulatory Visit (HOSPITAL_COMMUNITY): Payer: Self-pay

## 2024-02-15 VITALS — BP 145/85 | HR 89 | Ht 73.0 in | Wt 183.0 lb

## 2024-02-15 DIAGNOSIS — M5412 Radiculopathy, cervical region: Secondary | ICD-10-CM

## 2024-02-15 DIAGNOSIS — G629 Polyneuropathy, unspecified: Secondary | ICD-10-CM | POA: Diagnosis not present

## 2024-02-15 DIAGNOSIS — Z8673 Personal history of transient ischemic attack (TIA), and cerebral infarction without residual deficits: Secondary | ICD-10-CM

## 2024-02-15 MED ORDER — GABAPENTIN 300 MG PO CAPS
ORAL_CAPSULE | ORAL | 11 refills | Status: AC
  Filled 2024-02-15 – 2024-02-20 (×2): qty 120, 30d supply, fill #0
  Filled 2024-03-10 – 2024-03-14 (×3): qty 120, 30d supply, fill #1
  Filled 2024-05-01: qty 120, 30d supply, fill #2
  Filled 2024-05-27: qty 120, 30d supply, fill #3

## 2024-02-15 NOTE — Patient Instructions (Signed)
 Your Plan:  Continue gabapentin  300mg  AM and afternoon, 600mg  nightly for nerve pain  Continue close PCP follow up with PCP and endocrinology for stroke risk factor management      Follow up in 1 year or call earlier if needed     Thank you for coming to see us  at [facility]. I hope we have been able to provide you high quality care today.  You may receive a patient satisfaction survey over the next few weeks. We would appreciate your feedback and comments so that we may continue to improve ourselves and the health of our patients.

## 2024-02-15 NOTE — Progress Notes (Signed)
 [facility]  PATIENT: Travis [name] DOB: [birth year]   REASON FOR VISIT: Follow-up for history of stroke HISTORY FROM: Patient  Chief complaint: Chief Complaint  Patient presents with   Cerebrovascular Accident    Rm 8 alone  Pt is well and stable, reports no new concerns.      Follow-up visit:  Prior visit: 02/09/2023   Brief HPI: Travis [name] is a 63 y.o. Caucasian male with PMHx significant for multiple prior strokes, atrial fibrillation on Eliquis  (dx 2017 per IL[facility]), hx of TBI, HTN, HLD, DM, chronic lumbar spinal stenosis, chronic cervical pain  s/p fusion, hx of chronic LLE DVT, hx of GI bleed 2/2 ischemic colitis 2021, episodic vertigo/dizziness and OSA with CPAP noncompliance/intolerance.  He was initially referred to this office in 2016 evaluated by Dr. Jerri for dizzy spells and history of prior strokes as well as diabetic peripheral neuropathy and cervical radiculopathy.    Interval history:  Patient returns for yearly follow-up visit unaccompanied.  Overall stable from stroke standpoint.  Denies new stroke/TIA symptoms.  Compliant on Eliquis  and atorvastatin  without side effects.  Routinely followed by PCP and endocrinology.  Has been working on dietary changes to improve A1c, has follow-up with endocrinology next week with plans on repeat lab work.  Remains on gabapentin  for chronic nerve pain (cervical radiculopathy and likely DM polyneuropathy), continued benefit, can fluctuate, denies worsening of symptoms, tolerating without side effects.  No questions or concerns at this time.        REVIEW OF SYSTEMS: Full 14 system review of systems performed and notable only for those listed, all others are neg:  those listed in HPI     ALLERGIES: Allergies  Allergen Reactions   Lisinopril  Cough    Patient/spouse is not aware/familiar with why this is listed as an allergy Not a true allergy but on this list to avoid ACE inhibitors-SA Luking MD     HOME MEDICATIONS: Outpatient Medications Prior to Visit  Medication Sig Dispense Refill   ALPRAZolam  (XANAX ) 0.25 MG tablet Take 0.5 tablets (0.125 mg total) by mouth in the morning AND 1 tablet (0.25 mg total) daily in the afternoon AND 1 tablet (0.25 mg total) every evening. 75 tablet 3   apixaban  (ELIQUIS ) 5 MG TABS tablet Take 1 tablet (5 mg total) by mouth 2 (two) times daily. 180 tablet 3   atorvastatin  (LIPITOR) 20 MG tablet Take 1 tablet (20 mg total) by mouth daily. 90 tablet 1   Blood Pressure Monitoring (OMRON 3 SERIES BP MONITOR) DEVI USE AS DIRECTED. 1 each 0   Cholecalciferol  50 MCG (2000 UT) CAPS Take 1 capsule (2,000 Units total) by mouth daily with breakfast. 30 each 6   Continuous Glucose Sensor (FREESTYLE LIBRE 3 PLUS SENSOR) MISC Change sensor every 15 days as directed by the provider to read blood glucose levels. 2 each 0   FLUoxetine  (PROZAC ) 20 MG capsule Take 1 capsule (20 mg total) by mouth daily. 90 capsule 1   fluticasone  (FLONASE ) 50 MCG/ACT nasal spray Place 1 spray into both nostrils daily. 16 g 3   folic acid  (FOLVITE ) 1 MG tablet Take 1 tablet (1 mg total) by mouth daily. 30 tablet 12   gabapentin  (NEURONTIN ) 300 MG capsule Take 1 capsule (300 mg total) by mouth every morning AND 1 capsule (300 mg total) daily after lunch AND 2 capsules (600 mg total) at bedtime. 120 capsule 11   insulin  degludec (TRESIBA  FLEXTOUCH) 100 UNIT/ML FlexTouch Pen Inject 25  Units into the skin at bedtime. 30 mL 3   Insulin  Pen Needle (UNIFINE PENTIPS) 31G X 6 MM MISC USE TO INJECT INSULIN  DAILY AS DIRECTED 100 each 1   metFORMIN  (GLUCOPHAGE ) 500 MG tablet Take 1 tablet (500 mg total) by mouth 2 (two) times daily with a meal. 180 tablet 3   Multiple Vitamin (MULTIVITAMIN WITH MINERALS) TABS tablet Take 1 tablet daily by mouth.     valACYclovir  (VALTREX ) 1000 MG tablet Take 1 tablet (1,000 mg total) by mouth daily. 90 tablet 3   Blood Glucose Monitoring Suppl (FREESTYLE LITE) w/Device  KIT Use to check glucose twice daily 1 kit 0   Continuous Glucose Sensor (FREESTYLE LIBRE 3 PLUS SENSOR) MISC Change sensor every 15 days. 6 each 3   Insulin  Pen Needle (COMFORT EZ PEN NEEDLES) 31G X 6 MM MISC USE TO INJECT INSULIN  DAILY AS DIRECTED 100 each 2   ondansetron  (ZOFRAN ) 4 MG tablet Take 1 tablet (4 mg total) by mouth every 8 (eight) hours as needed for nausea or vomiting. 40 tablet 0   No facility-administered medications prior to visit.    PAST MEDICAL HISTORY: 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   Anxiety    Arthritis    Blood transfusion without reported diagnosis    Complication of anesthesia    bleed after last shoulder-had to stay overnight   Depression    Diabetes mellitus    Diabetic peripheral neuropathy (HCC) 03/28/2013   Diverticulosis    Dizziness    Hernia, inguinal    Hyperlipidemia    Hypertension    Impingement syndrome of left shoulder 08/02/2014   Ischemic colitis    Multi-infarct state 10/14/2014   Neuropathy of lower extremity    Night sweats    every once in a while   Sleep apnea    no CPAP   Stroke (HCC) 02/2015   no deficits   Syncope and collapse     PAST SURGICAL HISTORY: Past Surgical History:  Procedure Laterality Date   BIOPSY  06/14/2019   Procedure: BIOPSY;  Surgeon: Legrand Victory LITTIE DOUGLAS, MD;  Location: [facility];  Service: Gastroenterology;;   CERVICAL DISC SURGERY     COLON SURGERY  05/03/2001   1/3 removed for diverticulitis   COLONOSCOPY     COLONOSCOPY WITH PROPOFOL  N/A 06/14/2019   Procedure: COLONOSCOPY WITH PROPOFOL ;  Surgeon: Legrand Victory LITTIE DOUGLAS, MD;  Location: [facility];  Service: Gastroenterology;  Laterality: N/A;   EP IMPLANTABLE DEVICE N/A 05/01/2015   Procedure: Loop Recorder Insertion;  Surgeon: Elspeth JAYSON Sage, MD;  Location: [facility] IN[facility]ASIVE [facility];  Service: Cardiovascular;  Laterality: N/A;   INGUINAL HERNIA  REPAIR Bilateral    KNEE ARTHROSCOPY Right    SHOULDER ARTHROSCOPY Left 09/2022   SHOULDER ARTHROSCOPY Left 08/02/2014   Procedure: LEFT SHOULDER SCOPE DEBRIDEMENT/ACROMIOPLASTY;  Surgeon: Fonda Olmsted, MD;  Location: [facility];  Service: Orthopedics;  Laterality: Left;  ANESTHESIA: GENERAL, PRE/POST OP SCALENE   TEE WITHOUT CARDIOVERSION N/A 03/03/2015   Procedure: TRANSESOPHAGEAL ECHOCARDIOGRAM (TEE);  Surgeon: Pearla DELENA Rout, MD;  Location: [facility];  Service: Cardiology;  Laterality: N/A;   TRIGGER FINGER RELEASE Left 12/22/2022   Procedure: LEFT A-1 PULLEY RELEASE LONG FINGER;  Surgeon: Jerri Kay HERO, MD;  Location: [facility];  Service: Orthopedics;  Laterality: Left;   UMBILICAL HERNIA REPAIR  with other hernia repair with mesh   WRIST SURGERY Right    fusion    FAMILY HISTORY: Family History  Problem Relation Age of Onset   Stroke Father    Hyperlipidemia Father    Heart attack Sister 7   Stroke Sister    Dementia Mother    ALS Brother        age 39   Diabetes Maternal Grandfather    Colon cancer Neg Hx    Esophageal cancer Neg Hx    Stomach cancer Neg Hx    Rectal cancer Neg Hx     SOCIAL HISTORY: Social History   Socioeconomic History   Marital status: Married    Spouse name: Not on file   Number of children: 2   Years of education: College   Highest education level: Some college, no degree  Occupational History   Occupation: disabled  Tobacco Use   Smoking status: Former    Current packs/day: 0.00    Average packs/day: 1 pack/day for 8.0 years (8.0 ttl pk-yrs)    Types: Cigarettes    Start date: 06/07/1970    Quit date: 05/03/1978    Years since quitting: 45.8   Smokeless tobacco: Former    Types: Chew    Quit date: 05/03/1978   Tobacco comments:    Former smoker 07/19/22  Vaping Use   Vaping status: Never Used  Substance and Sexual Activity   Alcohol use: Yes    Alcohol/week: 1.0 standard drink of alcohol     Types: 1 Cans of beer per week    Comment: 1 beer every 6 months h/o heavy use in the past 07/19/22   Drug use: No   Sexual activity: Yes  Other Topics Concern   Not on file  Social History Narrative   Drinks some coffee, Drink diet sodas and tea   Social Drivers of Corporate investment banker Strain: Low Risk  (11/28/2023)   Overall Financial Resource Strain (CARDIA)    Difficulty of Paying Living Expenses: Not hard at all  Food Insecurity: No Food Insecurity (11/28/2023)   Hunger Vital Sign    Worried About Running Out of Food in the Last Year: Never true    Ran Out of Food in the Last Year: Never true  Transportation Needs: No Transportation Needs (11/28/2023)   PRAPARE - Administrator, Civil Service (Medical): No    Lack of Transportation (Non-Medical): No  Physical Activity: Sufficiently Active (11/28/2023)   Exercise Vital Sign    Days of Exercise per Week: 4 days    Minutes of Exercise per Session: 60 min  Stress: No Stress Concern Present (11/28/2023)   [employer] of Occupational Health - Occupational Stress Questionnaire    Feeling of Stress: Not at all  Social Connections: Moderately Isolated (11/28/2023)   Social Connection and Isolation Panel    Frequency of Communication with Friends and Family: More than three times a week    Frequency of Social Gatherings with Friends and Family: Three times a week    Attends Religious Services: Patient declined    Active Member of Clubs or Organizations: No    Attends Banker Meetings: Not on file    Marital Status: Married  Intimate Partner Violence: Not on file     PHYSICAL EXAM  Vitals:   02/15/24 1254  BP: (!) 145/85  Pulse: 89  Weight: 183 lb (83 kg)  Height: 6' 1 (1.854 m)   Body mass index  is 24.14 kg/m.  General: well developed, well nourished,  pleasant middle-age Caucasian male, seated, in no evident distress  Neurologic Exam Mental Status: Awake and fully alert.  Occasional speech hesitancy. Oriented to place and time. Recent and remote memory intact. Attention span, concentration and fund of knowledge appropriate. Mood and affect appropriate.  Cranial Nerves: Pupils equal, briskly reactive to light. Extraocular movements full without nystagmus. Visual fields full to confrontation. Hearing intact. Facial sensation intact. Face, tongue, palate moves normally and symmetrically.  Motor: Normal bulk and tone. Normal strength in all tested extremity muscles. Sensory.:  Decreased sensation to light touch left arm distally compared to right side (chronic) Coordination: Rapid alternating movements normal in all extremities except decreased left hand. Finger-to-nose and heel-to-shin performed accurately bilaterally. Gait and Station: Arises from chair without difficulty. Stance is normal. Gait demonstrates normal stride length and balance Reflexes: 1+ and symmetric. Toes downgoing.      ASSESSMENT AND PLAN Travis [name] is a 63 y.o. male with PMH of HTN, HLD, DM, chronic left DVT, atrial fibrillation (dx loop recorder) on Eliquis , GI bleed 2/2 ischemic colitis 2021 (d/c asa), cervical radiculopathy, OSA with CPAP noncompliance, old lacunar strokes involving b/l cerebrellar and left caudate head as well as possible right frontal cortex. On 02/28/15, right SCA cerebellar infarct with evidence of small PFO.  On 05/02/16, left dorsal medulla lacunar infarct and initiated aspirin  in addition to Eliquis  although aspirin  discontinued 2021 in setting of GI bleed.     Hx of multiple stroke -Continue Eliquis  and atorvastatin  for secondary stroke prevention and history of A. Fib managed/prescribed by PCP -Continue close PCP follow-up for aggressive stroke risk factor management including HTN with BP goal<130/90, HLD with LDL goal<70 and DM with A1c goal<7.0 -Stroke labs 09/2023: LDL 50, A1c 8.2  Cervical radiculopathy Chronic nerve pain Neuropathy, likely DM  related -Overall stable without progression -Continue gabapentin  300/300/600 - refill provided -eval by neurosurg -not a candidate for surgical options due to history of multiple cervical procedures -MR CERVICAL 01/2020: C3-4 and C4-5 severe bilateral foraminal stenosis, C6-7 mild spinal stenosis and severe bilateral foraminal stenosis - per report, similar appearance compared to 2016 -EMG/NCV 05/2018: Mild neuropathies left wrist, chronic denervation in left deltoid, left biceps and left tricep muscles which could be seen on left cervical polyradiculopathy (C5, C6 and C7).  Recommend correlation with MRI or CT cervical spine     Follow-up in 1 year or call earlier if needed   CC:  Luking, Glendia LABOR, MD      Harlene Bogaert, AGNP-BC  [facility] [address] [place], [place] [address]  Phone [phone] Fax [phone] Note: This document was prepared with digital dictation and possible smart phrase technology. Any transcriptional errors that result from this process are unintentional.

## 2024-02-16 ENCOUNTER — Other Ambulatory Visit: Payer: Self-pay

## 2024-02-20 ENCOUNTER — Other Ambulatory Visit: Payer: Self-pay

## 2024-02-20 ENCOUNTER — Other Ambulatory Visit (HOSPITAL_COMMUNITY): Payer: Self-pay

## 2024-02-23 ENCOUNTER — Ambulatory Visit (INDEPENDENT_AMBULATORY_CARE_PROVIDER_SITE_OTHER): Admitting: Nurse Practitioner

## 2024-02-23 ENCOUNTER — Encounter: Payer: Self-pay | Admitting: Nurse Practitioner

## 2024-02-23 VITALS — BP 138/82 | HR 72 | Ht 73.0 in | Wt 180.6 lb

## 2024-02-23 DIAGNOSIS — E782 Mixed hyperlipidemia: Secondary | ICD-10-CM | POA: Diagnosis not present

## 2024-02-23 DIAGNOSIS — Z794 Long term (current) use of insulin: Secondary | ICD-10-CM

## 2024-02-23 DIAGNOSIS — Z7984 Long term (current) use of oral hypoglycemic drugs: Secondary | ICD-10-CM | POA: Diagnosis not present

## 2024-02-23 DIAGNOSIS — D3502 Benign neoplasm of left adrenal gland: Secondary | ICD-10-CM

## 2024-02-23 DIAGNOSIS — E1159 Type 2 diabetes mellitus with other circulatory complications: Secondary | ICD-10-CM

## 2024-02-23 DIAGNOSIS — E559 Vitamin D deficiency, unspecified: Secondary | ICD-10-CM

## 2024-02-23 LAB — POCT GLYCOSYLATED HEMOGLOBIN (HGB A1C): Hemoglobin A1C: 7.8 % — AB (ref 4.0–5.6)

## 2024-02-23 NOTE — Progress Notes (Signed)
 02/23/2024, 11:40 AM              Endocrinology follow-up note    Subjective:    Patient ID: Travis [name], male    DOB: 07/12/1960.  Travis [name] is being seen in follow-up  for management of currently uncontrolled symptomatic diabetes requested by  Alphonsa Glendia LABOR, MD.   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   Anxiety    Arthritis    Blood transfusion without reported diagnosis    Complication of anesthesia    bleed after last shoulder-had to stay overnight   Depression    Diabetes mellitus    Diabetic peripheral neuropathy (HCC) 03/28/2013   Diverticulosis    Dizziness    Hernia, inguinal    Hyperlipidemia    Hypertension    Impingement syndrome of left shoulder 08/02/2014   Ischemic colitis    Multi-infarct state 10/14/2014   Neuropathy of lower extremity    Night sweats    every once in a while   Sleep apnea    no CPAP   Stroke (HCC) 02/2015   no deficits   Syncope and collapse    Past Surgical History:  Procedure Laterality Date   BIOPSY  06/14/2019   Procedure: BIOPSY;  Surgeon: Legrand Victory LITTIE DOUGLAS, MD;  Location: [facility];  Service: Gastroenterology;;   CERVICAL DISC SURGERY     COLON SURGERY  05/03/2001   1/3 removed for diverticulitis   COLONOSCOPY     COLONOSCOPY WITH PROPOFOL  N/A 06/14/2019   Procedure: COLONOSCOPY WITH PROPOFOL ;  Surgeon: Legrand Victory LITTIE DOUGLAS, MD;  Location: [facility];  Service: Gastroenterology;  Laterality: N/A;   EP IMPLANTABLE DEVICE N/A 05/01/2015   Procedure: Loop Recorder Insertion;  Surgeon: Elspeth JAYSON Sage, MD;  Location: [facility];  Service: Cardiovascular;  Laterality: N/A;   INGUINAL HERNIA REPAIR Bilateral    KNEE ARTHROSCOPY Right    SHOULDER ARTHROSCOPY Left 09/2022   SHOULDER ARTHROSCOPY Left 08/02/2014   Procedure: LEFT SHOULDER  SCOPE DEBRIDEMENT/ACROMIOPLASTY;  Surgeon: Fonda Olmsted, MD;  Location: [facility];  Service: Orthopedics;  Laterality: Left;  ANESTHESIA: GENERAL, PRE/POST OP SCALENE   TEE WITHOUT CARDIOVERSION N/A 03/03/2015   Procedure: TRANSESOPHAGEAL ECHOCARDIOGRAM (TEE);  Surgeon: Pearla LABOR Rout, MD;  Location: [facility];  Service: Cardiology;  Laterality: N/A;   TRIGGER FINGER RELEASE Left 12/22/2022   Procedure: LEFT A-1 PULLEY RELEASE LONG FINGER;  Surgeon: Jerri Kay HERO, MD;  Location: [facility];  Service: Orthopedics;  Laterality: Left;   UMBILICAL HERNIA REPAIR     with other hernia repair with mesh   WRIST SURGERY Right    fusion   Social History   Socioeconomic History   Marital status: Married    Spouse name: Not on file   Number of children: 2   Years of education: College   Highest education level: Some college, no degree  Occupational History   Occupation: disabled  Tobacco Use   Smoking status: Former    Current packs/day: 0.00    Average packs/day: 1 pack/day for 8.0 years (8.0 ttl  pk-yrs)    Types: Cigarettes    Start date: 06/07/1970    Quit date: 05/03/1978    Years since quitting: 45.8   Smokeless tobacco: Former    Types: Chew    Quit date: 05/03/1978   Tobacco comments:    Former smoker 07/19/22  Vaping Use   Vaping status: Never Used  Substance and Sexual Activity   Alcohol use: Yes    Alcohol/week: 1.0 standard drink of alcohol    Types: 1 Cans of beer per week    Comment: 1 beer every 6 months h/o heavy use in the past 07/19/22   Drug use: No   Sexual activity: Yes  Other Topics Concern   Not on file  Social History Narrative   Drinks some coffee, Drink diet sodas and tea   Social Drivers of Corporate investment banker Strain: Low Risk  (11/28/2023)   Overall Financial Resource Strain (CARDIA)    Difficulty of Paying Living Expenses: Not hard at all  Food Insecurity: No Food Insecurity (11/28/2023)   Hunger Vital Sign     Worried About Running Out of Food in the Last Year: Never true    Ran Out of Food in the Last Year: Never true  Transportation Needs: No Transportation Needs (11/28/2023)   PRAPARE - Administrator, Civil Service (Medical): No    Lack of Transportation (Non-Medical): No  Physical Activity: Sufficiently Active (11/28/2023)   Exercise Vital Sign    Days of Exercise per Week: 4 days    Minutes of Exercise per Session: 60 min  Stress: No Stress Concern Present (11/28/2023)   [employer] of Occupational Health - Occupational Stress Questionnaire    Feeling of Stress: Not at all  Social Connections: Moderately Isolated (11/28/2023)   Social Connection and Isolation Panel    Frequency of Communication with Friends and Family: More than three times a week    Frequency of Social Gatherings with Friends and Family: Three times a week    Attends Religious Services: Patient declined    Active Member of Clubs or Organizations: No    Attends Banker Meetings: Not on file    Marital Status: Married   Outpatient Encounter Medications as of 02/23/2024  Medication Sig   ALPRAZolam  (XANAX ) 0.25 MG tablet Take 0.5 tablets (0.125 mg total) by mouth in the morning AND 1 tablet (0.25 mg total) daily in the afternoon AND 1 tablet (0.25 mg total) every evening.   apixaban  (ELIQUIS ) 5 MG TABS tablet Take 1 tablet (5 mg total) by mouth 2 (two) times daily.   atorvastatin  (LIPITOR) 20 MG tablet Take 1 tablet (20 mg total) by mouth daily.   Blood Pressure Monitoring (OMRON 3 SERIES BP MONITOR) DEVI USE AS DIRECTED.   Cholecalciferol  50 MCG (2000 UT) CAPS Take 1 capsule (2,000 Units total) by mouth daily with breakfast.   Continuous Glucose Sensor (FREESTYLE LIBRE 3 PLUS SENSOR) MISC Change sensor every 15 days as directed by the provider to read blood glucose levels.   FLUoxetine  (PROZAC ) 20 MG capsule Take 1 capsule (20 mg total) by mouth daily.   fluticasone  (FLONASE ) 50 MCG/ACT  nasal spray Place 1 spray into both nostrils daily.   folic acid  (FOLVITE ) 1 MG tablet Take 1 tablet (1 mg total) by mouth daily.   gabapentin  (NEURONTIN ) 300 MG capsule Take 1 capsule (300 mg total) by mouth every morning AND 1 capsule (300 mg total) daily after lunch AND 2 capsules (600 mg  total) at bedtime.   insulin  degludec (TRESIBA  FLEXTOUCH) 100 UNIT/ML FlexTouch Pen Inject 25 Units into the skin at bedtime.   Insulin  Pen Needle (UNIFINE PENTIPS) 31G X 6 MM MISC USE TO INJECT INSULIN  DAILY AS DIRECTED   metFORMIN  (GLUCOPHAGE ) 500 MG tablet Take 1 tablet (500 mg total) by mouth 2 (two) times daily with a meal.   Multiple Vitamin (MULTIVITAMIN WITH MINERALS) TABS tablet Take 1 tablet daily by mouth.   valACYclovir  (VALTREX ) 1000 MG tablet Take 1 tablet (1,000 mg total) by mouth daily.   [DISCONTINUED] Blood Glucose Monitoring Suppl (FREESTYLE LITE) w/Device KIT Use to check glucose twice daily   [DISCONTINUED] Continuous Glucose Sensor (FREESTYLE LIBRE 3 PLUS SENSOR) MISC Change sensor every 15 days.   [DISCONTINUED] Insulin  Glargine (BASAGLAR  KWIKPEN) 100 UNIT/ML INJECT 50 UNITS INTO THE SKIN AT BEDTIME   [DISCONTINUED] Insulin  Pen Needle (COMFORT EZ PEN NEEDLES) 31G X 6 MM MISC USE TO INJECT INSULIN  DAILY AS DIRECTED   No facility-administered encounter medications on file as of 02/23/2024.    ALLERGIES: Allergies  Allergen Reactions   Lisinopril  Cough    Patient/spouse is not aware/familiar with why this is listed as an allergy Not a true allergy but on this list to avoid ACE inhibitors-SA Luking MD    VACCINATION STATUS: Immunization History  Administered Date(s) Administered   INFLUENZA, HIGH DOSE SEASONAL PF 01/26/2024   Influenza Split 03/28/2013   Influenza, Seasonal, Injecte, Preservative Fre 02/22/2023   Influenza,inj,Quad PF,6+ Mos 02/18/2014, 01/23/2015, 02/06/2016, 02/23/2017, 02/24/2018, 01/31/2020, 02/18/2021   Influenza-Unspecified 01/02/2011, 02/09/2019,  03/17/2022   Moderna Sars-Covid-2 Vaccination 07/21/2019, 08/22/2019, 05/01/2020   PNEUMOCOCCAL CONJUGATE-20 10/01/2021   Pfizer(Comirnaty )Fall Seasonal Vaccine 12 years and older 05/10/2022   Pneumococcal Polysaccharide-23 01/31/2010, 03/01/2015   Respiratory Syncytial Virus Vaccine ,Recomb Aduvanted(Arexvy ) 05/18/2022   Td (Adult),5 Lf Tetanus Toxid, Preservative Free 02/21/2003   Tdap 02/21/2003, 02/18/2014   Zoster Recombinant(Shingrix ) 04/19/2019, 06/29/2019    Diabetes He presents for his follow-up diabetic visit. He has type 2 diabetes mellitus. Onset time: He was diagnosed at approximate age of 78 years. His disease course has been fluctuating (He has prior history of heavy alcohol use/abuse.). There are no hypoglycemic associated symptoms. Pertinent negatives for hypoglycemia include no confusion, headaches, pallor or seizures. Associated symptoms include foot paresthesias. Pertinent negatives for diabetes include no chest pain, no fatigue, no polydipsia, no polyphagia, no polyuria and no weakness. There are no hypoglycemic complications. Symptoms are stable. Diabetic complications include a CVA, nephropathy and peripheral neuropathy. Risk factors for coronary artery disease include dyslipidemia, diabetes mellitus, hypertension, family history, male sex, tobacco exposure and sedentary lifestyle. Current diabetic treatment includes insulin  injections and oral agent (monotherapy). He is compliant with treatment most of the time. His weight is fluctuating minimally. He is following a generally unhealthy diet. When asked about meal planning, he reported none. He has had a previous visit with a dietitian. He participates in exercise intermittently. His home blood glucose trend is fluctuating dramatically. His breakfast blood glucose range is generally 110-130 mg/dl. His bedtime blood glucose range is generally >200 mg/dl. His overall blood glucose range is 180-200 mg/dl. (He presents today with his  CGM showing at goal fasting and above target postprandial glycemic profile overall.  His POCT A1c today is 7.8%, improving from last A1c of 8.2%.  Analysis of his CGM shows TIR 40%, TAR 60%, TBR 0% with a GMI of 8.1%.  He admits he has been eating more lately, snacking later in the evenings while watching sports.) An  ACE inhibitor/angiotensin II receptor blocker is not being taken. He does not see a podiatrist.Eye exam is current.  Hyperlipidemia This is a chronic problem. The current episode started more than 1 year ago. The problem is controlled. Recent lipid tests were reviewed and are normal. Exacerbating diseases include chronic renal disease and diabetes. Factors aggravating his hyperlipidemia include smoking. Pertinent negatives include no chest pain, myalgias or shortness of breath. Current antihyperlipidemic treatment includes statins. The current treatment provides moderate improvement of lipids. There are no compliance problems.  Risk factors for coronary artery disease include dyslipidemia, diabetes mellitus, hypertension, male sex, family history and a sedentary lifestyle.  Hypertension This is a chronic problem. The current episode started more than 1 year ago. The problem has been resolved since onset. The problem is controlled. Pertinent negatives include no chest pain, headaches, neck pain, palpitations or shortness of breath. There are no associated agents to hypertension. Risk factors for coronary artery disease include dyslipidemia, diabetes mellitus, family history, male gender, sedentary lifestyle and smoking/tobacco exposure. Past treatments include nothing. Compliance problems include diet.  Hypertensive end-organ damage includes kidney disease, CAD/MI and CVA. Identifiable causes of hypertension include chronic renal disease and sleep apnea.    Review of systems  Constitutional: + Minimally fluctuating body weight,  current Body mass index is 23.83 kg/m. , no fatigue, no subjective  hyperthermia, no subjective hypothermia Eyes: no blurry vision, no xerophthalmia ENT: no sore throat, no nodules palpated in throat, no dysphagia/odynophagia, no hoarseness Cardiovascular: no chest pain, no shortness of breath, no palpitations, no leg swelling Respiratory: no cough, no shortness of breath Gastrointestinal: no nausea/vomiting/diarrhea Musculoskeletal: + bilateral shoulder pain (chronic) Skin: no rashes, no hyperemia Neurological: no tremors, no numbness, no tingling, no dizziness Psychiatric: no depression, no anxiety  Objective:    BP 138/82 (BP Location: Left Arm, Patient Position: Sitting, Cuff Size: Large)   Pulse 72   Ht 6' 1 (1.854 m)   Wt 180 lb 9.6 oz (81.9 kg)   BMI 23.83 kg/m   Wt Readings from Last 3 Encounters:  02/23/24 180 lb 9.6 oz (81.9 kg)  02/15/24 183 lb (83 kg)  11/29/23 178 lb (80.7 kg)    BP Readings from Last 3 Encounters:  02/23/24 138/82  02/15/24 (!) 145/85  11/29/23 124/85     Physical Exam- Limited  Constitutional:  Body mass index is 23.83 kg/m. , not in acute distress, + inattentive, hyperactive state of mind with rapid/pressured speech Eyes:  EOMI, no exophthalmos Musculoskeletal: no gross deformities, strength intact in all four extremities, no gross restriction of joint movements Skin:  no rashes, no hyperemia Neurological: no tremor with outstretched hands   Diabetic Foot Exam - Simple   No data filed      CMP     Component Value Date/Time   NA 139 03/01/2023 1130   NA 142 11/18/2022 1059   K 3.9 03/01/2023 1130   CL 108 03/01/2023 1130   CO2 28 03/01/2023 1130   GLUCOSE 184 (H) 03/01/2023 1130   BUN 12 03/01/2023 1130   BUN 14 11/18/2022 1059   CREATININE 0.99 03/01/2023 1130   CREATININE 0.82 02/16/2014 1053   CALCIUM  8.7 (L) 03/01/2023 1130   PROT 6.6 09/30/2023 1614   ALBUMIN 4.2 09/30/2023 1614   AST 19 09/30/2023 1614   ALT 7 09/30/2023 1614   ALKPHOS 81 09/30/2023 1614   BILITOT 0.6 09/30/2023  1614   GFRNONAA >60 03/01/2023 1130   GFRAA 92 05/30/2020 1156  Diabetic Labs (most recent): Lab Results  Component Value Date   HGBA1C 7.8 (A) 02/23/2024   HGBA1C 8.2 (H) 09/30/2023   HGBA1C 7.4 (A) 04/12/2023   MICROALBUR 5.7 12/07/2019   MICROALBUR 0.8 02/16/2014     Lipid Panel ( most recent) Lipid Panel     Component Value Date/Time   CHOL 99 (L) 09/30/2023 1614   TRIG 98 09/30/2023 1614   HDL 30 (L) 09/30/2023 1614   CHOLHDL 3.3 09/30/2023 1614   CHOLHDL 3.2 05/03/2016 0545   VLDL 16 05/03/2016 0545   LDLCALC 50 09/30/2023 1614      Lab Results  Component Value Date   TSH 1.390 07/24/2021   TSH 2.120 12/07/2019   TSH 2.12 12/07/2019   TSH 1.960 03/12/2017   TSH 1.460 10/07/2016   TSH 0.720 05/02/2016   TSH 1.895 10/14/2014   FREET4 1.25 07/24/2021   FREET4 1.19 12/07/2019   FREET4 1.21 10/07/2016      Assessment & Plan:   1) DM type 2 causing vascular disease (HCC)  - Travis [name] has currently uncontrolled symptomatic type 2 DM since  63 years of age.  He presents today with his CGM showing at goal fasting and above target postprandial glycemic profile overall.  His POCT A1c today is 7.8%, improving from last A1c of 8.2%.  Analysis of his CGM shows TIR 40%, TAR 60%, TBR 0% with a GMI of 8.1%.  He admits he has been eating more lately, snacking later in the evenings while watching sports.  -his diabetes is complicated by CVA, history of smoking, history of heavy alcohol use/abuse in the past and Travis [name] remains at a high risk for more acute and chronic complications which include CAD, CVA, CKD, retinopathy, and neuropathy. These are all discussed in detail with the patient.  - Nutritional counseling repeated at each appointment due to patients tendency to fall back in to old habits.  - The patient admits there is a room for improvement in their diet and drink choices. -  Suggestion is made for the patient to avoid simple carbohydrates  from their diet including Cakes, Sweet Desserts / Pastries, Ice Cream, Soda (diet and regular), Sweet Tea, Candies, Chips, Cookies, Sweet Pastries, Store Bought Juices, Alcohol in Excess of 1-2 drinks a day, Artificial Sweeteners, Coffee Creamer, and Sugar-free Products. This will help patient to have stable blood glucose profile and potentially avoid unintended weight gain.   - I encouraged the patient to switch to unprocessed or minimally processed complex starch and increased protein intake (animal or plant source), fruits, and vegetables.   - Patient is advised to stick to a routine mealtimes to eat 3 meals a day and avoid unnecessary snacks (to snack only to correct hypoglycemia).  - I have approached him with the following individualized plan to manage diabetes and patient agrees:   - There could be a component of pancreatic endocrine and exocrine insufficiency given his prior history of heavy alcohol use.  Therefore, he may need multiple daily injections of insulin  to control diabetes in the future.  -#1 priority in this patient is to avoid hypoglycemia.    -He is advised to continue Tresiba  to 25 units SQ nightly and continue Metformin  500 mg po twice daily with meals.  He will do best to AVOID late night snacking.  We talked about this extensively again today.  -He is advised to monitor blood glucose twice daily (now using his CGM), before breakfast and before bed, and call  the clinic if he has readings less than 70 or greater than 200 for 3 tests in a row.  He is benefiting from CGM device, is advised to continue.  -Patient is not a candidate for SGLT2 inhibitors, nor incretin therapy due to his body habitus.  - Patient specific target  A1c;  LDL, HDL, Triglycerides, and  Waist Circumference were discussed in detail.  2) BP/HTN:   His blood pressure is controlled to target.  He is not on any antihypertensive medications at this time.  He will be considered for low dose ARB on  subsequent visits if BP becomes elevated over 140/90.  He does monitor BP at home and reports normal readings.  3) Lipids/HPL:  His most recent lipid panel from 09/30/23 shows controlled LDL at 50.  He is advised to continue Lipitor 20 mg po daily at bedtime.  Side effects and precautions discussed with him.    4) Left adrenal adenoma- During a past hospitalization for GI bleed from ischemic colitis a CT scan showed incidental finding of 2.1 cm left adrenal adenoma.  Subsequent plasma metanephrines were within normal limits, considered nonfunctional.  He will not need intervention for it at this time. His repeat 24-hr urine metanephrines, catecholamines, cortisol, and aldosterone were all still normal favoring benignity.    5)  Weight/Diet: His Body mass index is 23.83 kg/m.  He is not a weight loss candidate.  CDE Consult  is in progress , exercise, and detailed carbohydrates information provided.  If he experiences unintentional weight loss, he will be considered for Creon therapy.   6) Chronic Care/Health Maintenance: -he is on Statin medications and  is encouraged to continue to follow up with Ophthalmology, Dentist,  Podiatrist at least yearly or according to recommendations, and advised to  stay away from smoking. I have recommended yearly flu vaccine and pneumonia vaccination at least every 5 years; moderate intensity exercise for up to 150 minutes weekly; and  sleep for at least 7 hours a day.  - I advised patient to maintain close follow up with Alphonsa Glendia LABOR, MD for primary care needs.     I spent  41  minutes in the care of the patient today including review of labs from CMP, Lipids, Thyroid  Function, Hematology (current and previous including abstractions from other facilities); face-to-face time discussing  his blood glucose readings/logs, discussing hypoglycemia and hyperglycemia episodes and symptoms, medications doses, his options of short and long term treatment based on the  latest standards of care / guidelines;  discussion about incorporating lifestyle medicine;  and documenting the encounter. Risk reduction counseling performed per USPSTF guidelines to reduce obesity and cardiovascular risk factors.     Please refer to Patient Instructions for Blood Glucose Monitoring and Insulin /Medications Dosing Guide  in media tab for additional information. Please  also refer to  Patient Self Inventory in the Media  tab for reviewed elements of pertinent patient history.  Travis [name] participated in the discussions, expressed understanding, and voiced agreement with the above plans.  All questions were answered to his satisfaction. he is encouraged to contact clinic should he have any questions or concerns prior to his return visit.    Follow up plan: - Return in about 4 months (around 06/25/2024) for No previsit labs, Diabetes F/U with A1c in office, Bring meter and logs.   Benton Rio, [facility] [address] [place], [place] [address] Phone: [phone] Fax: [phone]  02/23/2024, 11:40 AM

## 2024-03-05 ENCOUNTER — Encounter: Payer: Self-pay | Admitting: Radiology

## 2024-03-10 ENCOUNTER — Other Ambulatory Visit (HOSPITAL_COMMUNITY): Payer: Self-pay

## 2024-03-12 ENCOUNTER — Other Ambulatory Visit: Payer: Self-pay | Admitting: Nurse Practitioner

## 2024-03-12 ENCOUNTER — Other Ambulatory Visit (HOSPITAL_COMMUNITY): Payer: Self-pay

## 2024-03-12 ENCOUNTER — Other Ambulatory Visit: Payer: Self-pay

## 2024-03-13 ENCOUNTER — Other Ambulatory Visit (HOSPITAL_COMMUNITY): Payer: Self-pay

## 2024-03-13 ENCOUNTER — Other Ambulatory Visit: Payer: Self-pay

## 2024-03-14 ENCOUNTER — Other Ambulatory Visit (HOSPITAL_COMMUNITY): Payer: Self-pay

## 2024-03-14 MED ORDER — FREESTYLE LIBRE 3 PLUS SENSOR MISC
3 refills | Status: AC
  Filled 2024-03-14: qty 6, fill #0
  Filled 2024-04-14: qty 6, 90d supply, fill #0

## 2024-03-27 ENCOUNTER — Other Ambulatory Visit (HOSPITAL_COMMUNITY): Payer: Self-pay

## 2024-03-27 ENCOUNTER — Other Ambulatory Visit: Payer: Self-pay | Admitting: Nurse Practitioner

## 2024-03-27 ENCOUNTER — Other Ambulatory Visit (HOSPITAL_BASED_OUTPATIENT_CLINIC_OR_DEPARTMENT_OTHER): Payer: Self-pay

## 2024-03-28 ENCOUNTER — Other Ambulatory Visit (HOSPITAL_BASED_OUTPATIENT_CLINIC_OR_DEPARTMENT_OTHER): Payer: Self-pay

## 2024-03-28 MED ORDER — COMFORT EZ PEN NEEDLES 31G X 5 MM MISC
2 refills | Status: AC
  Filled 2024-03-28: qty 100, 30d supply, fill #0
  Filled 2024-04-20: qty 100, 30d supply, fill #1
  Filled 2024-06-08: qty 100, 30d supply, fill #0
  Filled 2024-06-08: qty 100, 30d supply, fill #2

## 2024-04-06 ENCOUNTER — Other Ambulatory Visit: Payer: Self-pay

## 2024-04-12 ENCOUNTER — Other Ambulatory Visit (HOSPITAL_COMMUNITY): Payer: Self-pay

## 2024-04-12 ENCOUNTER — Other Ambulatory Visit: Payer: Self-pay

## 2024-04-12 ENCOUNTER — Other Ambulatory Visit: Payer: Self-pay | Admitting: Family Medicine

## 2024-04-12 MED ORDER — ALPRAZOLAM 0.25 MG PO TABS
ORAL_TABLET | ORAL | 3 refills | Status: AC
  Filled 2024-04-12 – 2024-04-13 (×2): qty 75, 30d supply, fill #0
  Filled 2024-05-15: qty 75, 30d supply, fill #1

## 2024-04-13 ENCOUNTER — Other Ambulatory Visit (HOSPITAL_COMMUNITY): Payer: Self-pay

## 2024-04-13 ENCOUNTER — Other Ambulatory Visit (HOSPITAL_BASED_OUTPATIENT_CLINIC_OR_DEPARTMENT_OTHER): Payer: Self-pay

## 2024-04-13 ENCOUNTER — Other Ambulatory Visit: Payer: Self-pay

## 2024-04-14 ENCOUNTER — Other Ambulatory Visit (HOSPITAL_COMMUNITY): Payer: Self-pay

## 2024-04-16 ENCOUNTER — Other Ambulatory Visit (HOSPITAL_COMMUNITY): Payer: Self-pay

## 2024-05-01 ENCOUNTER — Other Ambulatory Visit (HOSPITAL_COMMUNITY): Payer: Self-pay

## 2024-05-07 ENCOUNTER — Telehealth: Payer: Self-pay

## 2024-05-07 NOTE — Telephone Encounter (Signed)
 FMLA was dropped of to be completed [name] dropped off for the yearly renewal.

## 2024-05-11 ENCOUNTER — Other Ambulatory Visit: Payer: Self-pay

## 2024-05-11 ENCOUNTER — Other Ambulatory Visit (HOSPITAL_BASED_OUTPATIENT_CLINIC_OR_DEPARTMENT_OTHER): Payer: Self-pay

## 2024-05-11 ENCOUNTER — Other Ambulatory Visit (HOSPITAL_COMMUNITY): Payer: Self-pay

## 2024-05-14 NOTE — Telephone Encounter (Signed)
 Form was completed then forwarded to the front

## 2024-05-15 ENCOUNTER — Other Ambulatory Visit (HOSPITAL_BASED_OUTPATIENT_CLINIC_OR_DEPARTMENT_OTHER): Payer: Self-pay

## 2024-05-27 ENCOUNTER — Other Ambulatory Visit (HOSPITAL_COMMUNITY): Payer: Self-pay

## 2024-05-30 ENCOUNTER — Ambulatory Visit (INDEPENDENT_AMBULATORY_CARE_PROVIDER_SITE_OTHER): Admitting: Family Medicine

## 2024-05-30 VITALS — BP 112/70 | HR 87 | Temp 97.9°F | Ht 73.0 in | Wt 181.4 lb

## 2024-05-30 DIAGNOSIS — Z79899 Other long term (current) drug therapy: Secondary | ICD-10-CM | POA: Diagnosis not present

## 2024-05-30 DIAGNOSIS — F411 Generalized anxiety disorder: Secondary | ICD-10-CM | POA: Diagnosis not present

## 2024-05-30 DIAGNOSIS — E1159 Type 2 diabetes mellitus with other circulatory complications: Secondary | ICD-10-CM

## 2024-05-30 DIAGNOSIS — I10 Essential (primary) hypertension: Secondary | ICD-10-CM

## 2024-05-30 DIAGNOSIS — E785 Hyperlipidemia, unspecified: Secondary | ICD-10-CM

## 2024-05-30 DIAGNOSIS — E1169 Type 2 diabetes mellitus with other specified complication: Secondary | ICD-10-CM

## 2024-05-30 DIAGNOSIS — D649 Anemia, unspecified: Secondary | ICD-10-CM | POA: Diagnosis not present

## 2024-05-30 NOTE — Progress Notes (Signed)
" ° °  Subjective:    Patient ID: Travis [name], male    DOB: [birth year], 64 y.o.   MRN: [id number]  HPI  Pt is here for a 6 month follow up  Pt stated no concerns Hyperlipidemia associated with type 2 diabetes mellitus (HCC) - Plan: Lipid panel  Essential hypertension - Plan: Basic metabolic panel with GFR, Microalbumin/Creatinine Ratio, Urine  High risk medication use - Plan: Hepatic function panel  GAD (generalized anxiety disorder)  DM type 2 causing vascular disease (HCC) - Plan: Hemoglobin A1c, Microalbumin/Creatinine Ratio, Urine  Anemia, unspecified type - Plan: CBC with Differential/Platelet  He does have longstanding anxiety uses alprazolam  I encouraged him to try to taper down on this to some degree Blood pressure has been under good control taking his medication No recent stroke symptoms Diabetes sees endocrinology on a regular basis Also has intermittent problems with high sugars but he states he feels they have been doing better We will do comprehensive lab work before his next visit with endocrinology and share the labs with them  Review of Systems     Objective:   Physical Exam  General-in no acute distress Eyes-no discharge Lungs-respiratory rate normal, CTA CV-no murmurs,RRR Extremities skin warm dry no edema Neuro grossly normal Behavior normal, alert       Assessment & Plan:  1. Hyperlipidemia associated with type 2 diabetes mellitus (HCC) (Primary) Continue medication keep LDL below 70 check lab work await results - Lipid panel  2. Essential hypertension Blood pressure good control continue medication healthy diet regular physical activity check lab work - Basic metabolic panel with GFR - Microalbumin/Creatinine Ratio, Urine  3. High risk medication use Check lab work continue current meds - Hepatic function panel  4. GAD (generalized anxiety disorder) Uses his current medication does not abuse alprazolam  and has been on it for years  difficult to taper this way continue this currently  5. DM type 2 causing vascular disease (HCC) Reasonable control previously check A1c follow-up with endocrinology as planned - Hemoglobin A1c - Microalbumin/Creatinine Ratio, Urine  6. Anemia, unspecified type Check CBC monitor hemoglobin - CBC with Differential/Platelet  Follow-up here 6 months "

## 2024-06-02 ENCOUNTER — Other Ambulatory Visit (HOSPITAL_COMMUNITY): Payer: Self-pay

## 2024-06-08 ENCOUNTER — Other Ambulatory Visit: Payer: Self-pay

## 2024-06-08 ENCOUNTER — Other Ambulatory Visit: Payer: Self-pay | Admitting: Family Medicine

## 2024-06-08 ENCOUNTER — Other Ambulatory Visit (HOSPITAL_COMMUNITY): Payer: Self-pay

## 2024-06-08 MED ORDER — FLUOXETINE HCL 20 MG PO CAPS
20.0000 mg | ORAL_CAPSULE | Freq: Every day | ORAL | 1 refills | Status: AC
  Filled 2024-06-08: qty 90, 90d supply, fill #0

## 2024-06-28 ENCOUNTER — Ambulatory Visit: Admitting: Nurse Practitioner

## 2024-07-12 ENCOUNTER — Ambulatory Visit

## 2024-08-15 ENCOUNTER — Ambulatory Visit (HOSPITAL_COMMUNITY): Admitting: Internal Medicine

## 2024-12-05 ENCOUNTER — Ambulatory Visit: Admitting: Family Medicine

## 2025-02-18 ENCOUNTER — Ambulatory Visit: Admitting: Adult Health
